# Patient Record
Sex: Female | Born: 1940
Health system: Southern US, Community
[De-identification: ages and names within clinical notes are randomized; demographics above are authoritative.]

## PROBLEM LIST (undated history)

## (undated) DIAGNOSIS — S31000A Unspecified open wound of lower back and pelvis without penetration into retroperitoneum, initial encounter: Secondary | ICD-10-CM

## (undated) DIAGNOSIS — I4819 Other persistent atrial fibrillation: Secondary | ICD-10-CM

## (undated) DIAGNOSIS — I10 Essential (primary) hypertension: Secondary | ICD-10-CM

## (undated) DIAGNOSIS — Z9289 Personal history of other medical treatment: Secondary | ICD-10-CM

## (undated) DIAGNOSIS — G8929 Other chronic pain: Secondary | ICD-10-CM

## (undated) DIAGNOSIS — M8580 Other specified disorders of bone density and structure, unspecified site: Secondary | ICD-10-CM

## (undated) DIAGNOSIS — E039 Hypothyroidism, unspecified: Secondary | ICD-10-CM

## (undated) DIAGNOSIS — Z5189 Encounter for other specified aftercare: Secondary | ICD-10-CM

## (undated) DIAGNOSIS — E785 Hyperlipidemia, unspecified: Secondary | ICD-10-CM

## (undated) DIAGNOSIS — M545 Other chronic pain: Secondary | ICD-10-CM

## (undated) DIAGNOSIS — Z8673 Personal history of transient ischemic attack (TIA), and cerebral infarction without residual deficits: Secondary | ICD-10-CM

## (undated) HISTORY — DX: Encounter for other specified aftercare: Z51.89

## (undated) HISTORY — PX: THYROIDECTOMY: SHX17

## (undated) HISTORY — PX: HAND SURGERY: SHX662

## (undated) HISTORY — DX: Other specified disorders of bone density and structure, unspecified site: M85.80

## (undated) HISTORY — DX: Hypothyroidism, unspecified: E03.9

## (undated) HISTORY — DX: Essential (primary) hypertension: I10

## (undated) HISTORY — DX: Personal history of other medical treatment: Z92.89

## (undated) HISTORY — DX: Other persistent atrial fibrillation: I48.19

## (undated) HISTORY — DX: Hyperlipidemia, unspecified: E78.5

## (undated) HISTORY — DX: Other chronic pain: M54.50

## (undated) HISTORY — DX: Other chronic pain: G89.29

## (undated) HISTORY — DX: Low back pain: M54.5

## (undated) HISTORY — DX: Personal history of transient ischemic attack (TIA), and cerebral infarction without residual deficits: Z86.73

---

## 1968-07-20 HISTORY — PX: ABDOMINAL HYSTERECTOMY: SHX81

## 1997-10-12 ENCOUNTER — Other Ambulatory Visit: Admission: RE | Admit: 1997-10-12 | Discharge: 1997-10-12 | Payer: Self-pay | Admitting: Family Medicine

## 1998-03-09 ENCOUNTER — Emergency Department (HOSPITAL_COMMUNITY): Admission: EM | Admit: 1998-03-09 | Discharge: 1998-03-09 | Payer: Self-pay | Admitting: Emergency Medicine

## 1998-09-01 ENCOUNTER — Emergency Department (HOSPITAL_COMMUNITY): Admission: EM | Admit: 1998-09-01 | Discharge: 1998-09-01 | Payer: Self-pay | Admitting: Emergency Medicine

## 1999-02-05 ENCOUNTER — Other Ambulatory Visit: Admission: RE | Admit: 1999-02-05 | Discharge: 1999-02-05 | Payer: Self-pay | Admitting: Family Medicine

## 1999-07-09 ENCOUNTER — Ambulatory Visit (HOSPITAL_BASED_OUTPATIENT_CLINIC_OR_DEPARTMENT_OTHER): Admission: RE | Admit: 1999-07-09 | Discharge: 1999-07-10 | Payer: Self-pay | Admitting: Orthopedic Surgery

## 1999-11-24 ENCOUNTER — Encounter: Payer: Self-pay | Admitting: Family Medicine

## 1999-11-24 ENCOUNTER — Encounter: Admission: RE | Admit: 1999-11-24 | Discharge: 1999-11-24 | Payer: Self-pay | Admitting: Family Medicine

## 2000-03-29 ENCOUNTER — Other Ambulatory Visit: Admission: RE | Admit: 2000-03-29 | Discharge: 2000-03-29 | Payer: Self-pay | Admitting: *Deleted

## 2000-08-23 ENCOUNTER — Other Ambulatory Visit: Admission: RE | Admit: 2000-08-23 | Discharge: 2000-08-23 | Payer: Self-pay | Admitting: Orthopedic Surgery

## 2001-01-06 ENCOUNTER — Encounter: Admission: RE | Admit: 2001-01-06 | Discharge: 2001-01-06 | Payer: Self-pay | Admitting: *Deleted

## 2001-01-12 ENCOUNTER — Encounter: Admission: RE | Admit: 2001-01-12 | Discharge: 2001-01-12 | Payer: Self-pay | Admitting: *Deleted

## 2001-01-16 ENCOUNTER — Emergency Department (HOSPITAL_COMMUNITY): Admission: EM | Admit: 2001-01-16 | Discharge: 2001-01-16 | Payer: Self-pay | Admitting: *Deleted

## 2001-01-16 ENCOUNTER — Encounter: Payer: Self-pay | Admitting: *Deleted

## 2001-02-07 ENCOUNTER — Encounter: Payer: Self-pay | Admitting: Cardiology

## 2001-02-07 ENCOUNTER — Inpatient Hospital Stay (HOSPITAL_COMMUNITY): Admission: EM | Admit: 2001-02-07 | Discharge: 2001-02-10 | Payer: Self-pay | Admitting: *Deleted

## 2001-06-14 ENCOUNTER — Encounter: Payer: Self-pay | Admitting: Emergency Medicine

## 2001-06-14 ENCOUNTER — Emergency Department (HOSPITAL_COMMUNITY): Admission: EM | Admit: 2001-06-14 | Discharge: 2001-06-14 | Payer: Self-pay | Admitting: Emergency Medicine

## 2001-06-17 ENCOUNTER — Emergency Department (HOSPITAL_COMMUNITY): Admission: EM | Admit: 2001-06-17 | Discharge: 2001-06-17 | Payer: Self-pay | Admitting: Emergency Medicine

## 2001-06-21 ENCOUNTER — Emergency Department (HOSPITAL_COMMUNITY): Admission: EM | Admit: 2001-06-21 | Discharge: 2001-06-21 | Payer: Self-pay | Admitting: *Deleted

## 2001-07-20 HISTORY — PX: COLONOSCOPY: SHX174

## 2001-10-27 ENCOUNTER — Emergency Department (HOSPITAL_COMMUNITY): Admission: EM | Admit: 2001-10-27 | Discharge: 2001-10-27 | Payer: Self-pay | Admitting: Emergency Medicine

## 2001-10-27 ENCOUNTER — Encounter: Payer: Self-pay | Admitting: Emergency Medicine

## 2001-11-08 ENCOUNTER — Encounter: Admission: RE | Admit: 2001-11-08 | Discharge: 2001-11-08 | Payer: Self-pay | Admitting: Specialist

## 2001-11-08 ENCOUNTER — Encounter: Payer: Self-pay | Admitting: Specialist

## 2002-04-18 ENCOUNTER — Encounter: Payer: Self-pay | Admitting: Family Medicine

## 2002-04-18 ENCOUNTER — Encounter: Admission: RE | Admit: 2002-04-18 | Discharge: 2002-04-18 | Payer: Self-pay | Admitting: Family Medicine

## 2002-06-20 ENCOUNTER — Encounter: Payer: Self-pay | Admitting: Internal Medicine

## 2002-06-20 LAB — HM COLONOSCOPY

## 2002-10-04 ENCOUNTER — Encounter: Payer: Self-pay | Admitting: Emergency Medicine

## 2002-10-04 ENCOUNTER — Emergency Department (HOSPITAL_COMMUNITY): Admission: EM | Admit: 2002-10-04 | Discharge: 2002-10-04 | Payer: Self-pay | Admitting: Emergency Medicine

## 2003-04-27 ENCOUNTER — Encounter: Payer: Self-pay | Admitting: Emergency Medicine

## 2003-04-27 ENCOUNTER — Emergency Department (HOSPITAL_COMMUNITY): Admission: EM | Admit: 2003-04-27 | Discharge: 2003-04-27 | Payer: Self-pay | Admitting: Emergency Medicine

## 2003-11-17 ENCOUNTER — Emergency Department (HOSPITAL_COMMUNITY): Admission: EM | Admit: 2003-11-17 | Discharge: 2003-11-17 | Payer: Self-pay | Admitting: Family Medicine

## 2004-08-20 ENCOUNTER — Ambulatory Visit: Payer: Self-pay | Admitting: Family Medicine

## 2004-11-21 ENCOUNTER — Ambulatory Visit (HOSPITAL_COMMUNITY): Admission: RE | Admit: 2004-11-21 | Discharge: 2004-11-21 | Payer: Self-pay | Admitting: Anesthesiology

## 2004-12-11 ENCOUNTER — Ambulatory Visit: Payer: Self-pay | Admitting: Family Medicine

## 2004-12-16 ENCOUNTER — Ambulatory Visit: Payer: Self-pay | Admitting: Family Medicine

## 2005-05-25 ENCOUNTER — Ambulatory Visit: Payer: Self-pay | Admitting: Family Medicine

## 2005-07-20 ENCOUNTER — Encounter: Payer: Self-pay | Admitting: Family Medicine

## 2005-09-15 ENCOUNTER — Ambulatory Visit: Payer: Self-pay | Admitting: Family Medicine

## 2006-01-12 ENCOUNTER — Ambulatory Visit (HOSPITAL_COMMUNITY): Admission: RE | Admit: 2006-01-12 | Discharge: 2006-01-12 | Payer: Self-pay | Admitting: Anesthesiology

## 2006-02-16 ENCOUNTER — Ambulatory Visit (HOSPITAL_COMMUNITY): Admission: RE | Admit: 2006-02-16 | Discharge: 2006-02-16 | Payer: Self-pay | Admitting: Anesthesiology

## 2006-06-17 ENCOUNTER — Emergency Department (HOSPITAL_COMMUNITY): Admission: EM | Admit: 2006-06-17 | Discharge: 2006-06-17 | Payer: Self-pay | Admitting: Family Medicine

## 2006-07-09 ENCOUNTER — Ambulatory Visit: Payer: Self-pay | Admitting: Family Medicine

## 2006-07-14 ENCOUNTER — Ambulatory Visit: Payer: Self-pay | Admitting: Family Medicine

## 2006-11-24 ENCOUNTER — Ambulatory Visit: Payer: Self-pay | Admitting: Family Medicine

## 2006-11-24 LAB — CONVERTED CEMR LAB
ALT: 15 units/L (ref 0–40)
Albumin: 3.1 g/dL — ABNORMAL LOW (ref 3.5–5.2)
Alkaline Phosphatase: 47 units/L (ref 39–117)
Basophils Relative: 0.2 % (ref 0.0–1.0)
Bilirubin, Direct: 0.1 mg/dL (ref 0.0–0.3)
Cholesterol: 247 mg/dL (ref 0–200)
Creatinine, Ser: 1.4 mg/dL — ABNORMAL HIGH (ref 0.4–1.2)
Direct LDL: 136.6 mg/dL
Eosinophils Absolute: 0.1 10*3/uL (ref 0.0–0.6)
GFR calc Af Amer: 48 mL/min
Glucose, Bld: 83 mg/dL (ref 70–99)
HCT: 33.3 % — ABNORMAL LOW (ref 36.0–46.0)
HDL: 49.1 mg/dL (ref >39.0)
Hemoglobin: 11.5 g/dL — ABNORMAL LOW (ref 12.0–15.0)
MCV: 92.6 fL (ref 78.0–100.0)
Monocytes Absolute: 0.5 10*3/uL (ref 0.2–0.7)
Monocytes Relative: 6.1 % (ref 3.0–11.0)
Neutro Abs: 6.2 10*3/uL (ref 1.4–7.7)
Neutrophils Relative %: 72.8 % (ref 43.0–77.0)
Platelets: 262 10*3/uL (ref 150–400)
RBC: 3.6 M/uL — ABNORMAL LOW (ref 3.87–5.11)
Sodium: 138 meq/L (ref 135–145)
TSH: 4.01 microintl units/mL (ref 0.35–5.50)
Total CHOL/HDL Ratio: 5

## 2007-03-08 ENCOUNTER — Telehealth: Payer: Self-pay | Admitting: Family Medicine

## 2007-03-08 ENCOUNTER — Ambulatory Visit: Payer: Self-pay | Admitting: Family Medicine

## 2007-03-08 LAB — CONVERTED CEMR LAB
Cholesterol: 200 mg/dL (ref 0–200)
HDL: 44.7 mg/dL (ref >39.0)
Total CHOL/HDL Ratio: 4.5

## 2007-04-13 ENCOUNTER — Ambulatory Visit (HOSPITAL_COMMUNITY): Admission: RE | Admit: 2007-04-13 | Discharge: 2007-04-13 | Payer: Self-pay | Admitting: Anesthesiology

## 2007-08-30 ENCOUNTER — Telehealth: Payer: Self-pay | Admitting: Family Medicine

## 2007-10-10 ENCOUNTER — Ambulatory Visit (HOSPITAL_COMMUNITY): Admission: RE | Admit: 2007-10-10 | Discharge: 2007-10-10 | Payer: Self-pay | Admitting: Anesthesiology

## 2007-10-13 ENCOUNTER — Ambulatory Visit (HOSPITAL_COMMUNITY): Admission: RE | Admit: 2007-10-13 | Discharge: 2007-10-13 | Payer: Self-pay | Admitting: Anesthesiology

## 2007-10-20 ENCOUNTER — Telehealth: Payer: Self-pay | Admitting: Family Medicine

## 2007-10-24 ENCOUNTER — Telehealth: Payer: Self-pay | Admitting: Family Medicine

## 2007-11-09 ENCOUNTER — Ambulatory Visit: Payer: Self-pay | Admitting: Family Medicine

## 2007-11-09 DIAGNOSIS — I10 Essential (primary) hypertension: Secondary | ICD-10-CM

## 2007-11-09 DIAGNOSIS — E785 Hyperlipidemia, unspecified: Secondary | ICD-10-CM | POA: Insufficient documentation

## 2007-11-09 DIAGNOSIS — D649 Anemia, unspecified: Secondary | ICD-10-CM | POA: Insufficient documentation

## 2007-11-09 DIAGNOSIS — M8580 Other specified disorders of bone density and structure, unspecified site: Secondary | ICD-10-CM

## 2007-11-09 DIAGNOSIS — T50995A Adverse effect of other drugs, medicaments and biological substances, initial encounter: Secondary | ICD-10-CM

## 2007-11-17 ENCOUNTER — Ambulatory Visit: Payer: Self-pay | Admitting: Family Medicine

## 2007-11-17 LAB — CONVERTED CEMR LAB
ALT: 11 units/L (ref 0–35)
Alkaline Phosphatase: 50 units/L (ref 39–117)
Basophils Relative: 0 % (ref 0.0–1.0)
Bilirubin, Direct: 0.1 mg/dL (ref 0.0–0.3)
CO2: 33 meq/L — ABNORMAL HIGH (ref 19–32)
Calcium: 9.6 mg/dL (ref 8.4–10.5)
Chloride: 95 meq/L — ABNORMAL LOW (ref 96–112)
Eosinophils Absolute: 0 10*3/uL (ref 0.0–0.7)
Eosinophils Relative: 0.7 % (ref 0.0–5.0)
GFR calc Af Amer: 80 mL/min
HCT: 42 % (ref 36.0–46.0)
Lymphocytes Relative: 14.3 % (ref 12.0–46.0)
Monocytes Relative: 4.5 % (ref 3.0–12.0)
Neutro Abs: 5.7 10*3/uL (ref 1.4–7.7)
Neutrophils Relative %: 80.5 % — ABNORMAL HIGH (ref 43.0–77.0)
Total Bilirubin: 0.6 mg/dL (ref 0.3–1.2)
Total CHOL/HDL Ratio: 4.7
VLDL: 33 mg/dL (ref 0–40)
Vit D, 1,25-Dihydroxy: 14 — ABNORMAL LOW (ref 30–89)
WBC: 7 10*3/uL (ref 4.5–10.5)

## 2007-11-21 DIAGNOSIS — E039 Hypothyroidism, unspecified: Secondary | ICD-10-CM

## 2007-12-19 ENCOUNTER — Ambulatory Visit (HOSPITAL_COMMUNITY): Admission: RE | Admit: 2007-12-19 | Discharge: 2007-12-19 | Payer: Self-pay | Admitting: Family Medicine

## 2008-07-20 HISTORY — PX: FLEXIBLE SIGMOIDOSCOPY: SHX1649

## 2008-11-17 HISTORY — PX: LUMBAR LAMINECTOMY: SHX95

## 2008-12-13 ENCOUNTER — Ambulatory Visit: Payer: Self-pay | Admitting: Family Medicine

## 2008-12-13 DIAGNOSIS — M1710 Unilateral primary osteoarthritis, unspecified knee: Secondary | ICD-10-CM | POA: Insufficient documentation

## 2008-12-13 DIAGNOSIS — M5126 Other intervertebral disc displacement, lumbar region: Secondary | ICD-10-CM | POA: Insufficient documentation

## 2008-12-13 DIAGNOSIS — IMO0002 Reserved for concepts with insufficient information to code with codable children: Secondary | ICD-10-CM

## 2008-12-13 LAB — CONVERTED CEMR LAB
Glucose, Urine, Semiquant: NEGATIVE
Urobilinogen, UA: 1
pH: 5.5

## 2008-12-26 ENCOUNTER — Ambulatory Visit: Payer: Self-pay | Admitting: Cardiology

## 2008-12-26 ENCOUNTER — Ambulatory Visit: Payer: Self-pay | Admitting: Internal Medicine

## 2008-12-26 ENCOUNTER — Inpatient Hospital Stay (HOSPITAL_COMMUNITY): Admission: EM | Admit: 2008-12-26 | Discharge: 2009-01-07 | Payer: Self-pay | Admitting: Emergency Medicine

## 2008-12-26 LAB — CONVERTED CEMR LAB
AST: 14 units/L (ref 0–37)
BUN: 15 mg/dL (ref 6–23)
Basophils Relative: 0 %
Basophils Relative: 0.4 % (ref 0.0–3.0)
Bilirubin, Direct: 0.1 mg/dL (ref 0.0–0.3)
CO2: 30 meq/L
Chloride: 81 meq/L
Chloride: 98 meq/L (ref 96–112)
Cholesterol: 196 mg/dL (ref 0–200)
Creatinine, Ser: 3.98 mg/dL
Eosinophils Absolute: 0.1 10*3/uL (ref 0.0–0.7)
GFR calc non Af Amer: 65.93 mL/min (ref >60)
Glucose, Bld: 120 mg/dL
HCT: 30.5 %
Hemoglobin: 10.4 g/dL
LDL Cholesterol: 123 mg/dL — ABNORMAL HIGH (ref 0–99)
Lymphocytes Relative: 15.9 % (ref 12.0–46.0)
MCHC: 36 g/dL (ref 30.0–36.0)
MCV: 89.8 fL (ref 78.0–100.0)
MCV: 91.6 fL
Monocytes Relative: 2 %
Neutrophils Relative %: 95 %
Platelets: 304 10*3/uL (ref 150.0–400.0)
Potassium: 3.4 meq/L — ABNORMAL LOW (ref 3.5–5.1)
Potassium: 3.6 meq/L
RBC: 4.05 M/uL (ref 3.87–5.11)
Sodium: 126 meq/L
Sodium: 134 meq/L — ABNORMAL LOW (ref 135–145)
TSH: 2.37 microintl units/mL (ref 0.35–5.50)
Total Bilirubin: 0.8 mg/dL (ref 0.3–1.2)
Triglycerides: 128 mg/dL (ref 0.0–149.0)
VLDL: 25.6 mg/dL (ref 0.0–40.0)
Vit D, 25-Hydroxy: 29 ng/mL — ABNORMAL LOW (ref 30–89)
WBC: 20.6 10*3/uL
WBC: 7.2 10*3/uL (ref 4.5–10.5)

## 2008-12-27 ENCOUNTER — Encounter: Payer: Self-pay | Admitting: Internal Medicine

## 2008-12-27 ENCOUNTER — Encounter (INDEPENDENT_AMBULATORY_CARE_PROVIDER_SITE_OTHER): Payer: Self-pay | Admitting: Internal Medicine

## 2008-12-27 ENCOUNTER — Ambulatory Visit: Payer: Self-pay | Admitting: Vascular Surgery

## 2008-12-27 ENCOUNTER — Encounter: Payer: Self-pay | Admitting: Family Medicine

## 2008-12-27 LAB — CONVERTED CEMR LAB
ALT: 13 units/L
Albumin: 1.7 g/dL
Chloride: 86 meq/L
Hemoglobin: 9.3 g/dL
Total Protein: 5.2 g/dL
WBC: 20 10*3/uL

## 2008-12-31 ENCOUNTER — Encounter: Payer: Self-pay | Admitting: Family Medicine

## 2009-01-03 ENCOUNTER — Encounter: Payer: Self-pay | Admitting: Internal Medicine

## 2009-01-04 ENCOUNTER — Ambulatory Visit: Payer: Self-pay | Admitting: Gastroenterology

## 2009-01-05 ENCOUNTER — Encounter (INDEPENDENT_AMBULATORY_CARE_PROVIDER_SITE_OTHER): Payer: Self-pay | Admitting: Gastroenterology

## 2009-01-05 ENCOUNTER — Encounter: Payer: Self-pay | Admitting: Internal Medicine

## 2009-01-07 ENCOUNTER — Encounter: Payer: Self-pay | Admitting: Internal Medicine

## 2009-01-07 LAB — CONVERTED CEMR LAB
BUN: 4 mg/dL
CO2: 23 meq/L
Calcium: 7.4 mg/dL
Creatinine, Ser: 0.75 mg/dL
Glucose, Bld: 91 mg/dL
Potassium: 4.3 meq/L
RBC: 3.2 M/uL
RDW: 14.9 %
Sodium: 138 meq/L
WBC: 14.2 10*3/uL

## 2009-01-10 ENCOUNTER — Telehealth (INDEPENDENT_AMBULATORY_CARE_PROVIDER_SITE_OTHER): Payer: Self-pay | Admitting: *Deleted

## 2009-01-23 ENCOUNTER — Encounter: Payer: Self-pay | Admitting: Internal Medicine

## 2009-01-23 DIAGNOSIS — G894 Chronic pain syndrome: Secondary | ICD-10-CM | POA: Insufficient documentation

## 2009-01-23 DIAGNOSIS — M1990 Unspecified osteoarthritis, unspecified site: Secondary | ICD-10-CM | POA: Insufficient documentation

## 2009-01-24 ENCOUNTER — Ambulatory Visit: Payer: Self-pay | Admitting: Internal Medicine

## 2009-01-24 ENCOUNTER — Telehealth: Payer: Self-pay | Admitting: Internal Medicine

## 2009-01-24 DIAGNOSIS — I2510 Atherosclerotic heart disease of native coronary artery without angina pectoris: Secondary | ICD-10-CM | POA: Insufficient documentation

## 2009-01-31 ENCOUNTER — Ambulatory Visit: Payer: Self-pay | Admitting: Internal Medicine

## 2009-01-31 DIAGNOSIS — R197 Diarrhea, unspecified: Secondary | ICD-10-CM

## 2009-04-29 ENCOUNTER — Telehealth: Payer: Self-pay | Admitting: Internal Medicine

## 2009-05-22 ENCOUNTER — Encounter: Admission: RE | Admit: 2009-05-22 | Discharge: 2009-05-22 | Payer: Self-pay | Admitting: Internal Medicine

## 2009-07-20 HISTORY — PX: CHOLECYSTECTOMY: SHX55

## 2009-07-25 ENCOUNTER — Inpatient Hospital Stay (HOSPITAL_COMMUNITY): Admission: AD | Admit: 2009-07-25 | Discharge: 2009-07-27 | Payer: Self-pay | Admitting: General Surgery

## 2009-08-15 ENCOUNTER — Encounter: Payer: Self-pay | Admitting: Internal Medicine

## 2009-11-21 ENCOUNTER — Telehealth: Payer: Self-pay | Admitting: Internal Medicine

## 2009-12-17 ENCOUNTER — Telehealth: Payer: Self-pay | Admitting: Family Medicine

## 2009-12-17 ENCOUNTER — Telehealth: Payer: Self-pay | Admitting: Internal Medicine

## 2010-01-09 ENCOUNTER — Ambulatory Visit: Payer: Self-pay | Admitting: Internal Medicine

## 2010-01-09 DIAGNOSIS — R1084 Generalized abdominal pain: Secondary | ICD-10-CM | POA: Insufficient documentation

## 2010-01-09 LAB — CONVERTED CEMR LAB
Albumin: 3.9 g/dL (ref 3.5–5.2)
BUN: 20 mg/dL (ref 6–23)
Basophils Relative: 0.3 % (ref 0.0–3.0)
CO2: 29 meq/L (ref 19–32)
Calcium: 9.1 mg/dL (ref 8.4–10.5)
Chloride: 99 meq/L (ref 96–112)
Eosinophils Absolute: 0.1 10*3/uL (ref 0.0–0.7)
Eosinophils Relative: 0.8 % (ref 0.0–5.0)
GFR calc non Af Amer: 53.83 mL/min (ref >60)
Glucose, Bld: 104 mg/dL — ABNORMAL HIGH (ref 70–99)
Lymphs Abs: 1.7 10*3/uL (ref 0.7–4.0)
Monocytes Relative: 5.2 % (ref 3.0–12.0)
RDW: 13.5 % (ref 11.5–14.6)
Sodium: 136 meq/L (ref 135–145)
TSH: 3.17 microintl units/mL (ref 0.35–5.50)
Total Protein: 7.1 g/dL (ref 6.0–8.3)
WBC: 6.3 10*3/uL (ref 4.5–10.5)

## 2010-01-10 ENCOUNTER — Ambulatory Visit: Payer: Self-pay | Admitting: Cardiology

## 2010-04-15 ENCOUNTER — Telehealth: Payer: Self-pay | Admitting: Internal Medicine

## 2010-04-16 ENCOUNTER — Encounter: Payer: Self-pay | Admitting: Internal Medicine

## 2010-04-18 ENCOUNTER — Ambulatory Visit: Payer: Self-pay | Admitting: Internal Medicine

## 2010-04-18 DIAGNOSIS — M546 Pain in thoracic spine: Secondary | ICD-10-CM | POA: Insufficient documentation

## 2010-05-26 ENCOUNTER — Encounter: Admission: RE | Admit: 2010-05-26 | Discharge: 2010-05-26 | Payer: Self-pay | Admitting: Internal Medicine

## 2010-05-26 LAB — HM MAMMOGRAPHY: HM Mammogram: NEGATIVE

## 2010-06-03 ENCOUNTER — Encounter: Admission: RE | Admit: 2010-06-03 | Discharge: 2010-06-03 | Payer: Self-pay | Admitting: Orthopaedic Surgery

## 2010-06-24 ENCOUNTER — Ambulatory Visit: Payer: Self-pay | Admitting: Internal Medicine

## 2010-06-24 DIAGNOSIS — J209 Acute bronchitis, unspecified: Secondary | ICD-10-CM

## 2010-08-10 ENCOUNTER — Encounter: Payer: Self-pay | Admitting: Anesthesiology

## 2010-08-19 NOTE — Letter (Signed)
Summary: New Patient letter  [facility]  [address] [place], [place] [address]   Phone: [phone]  Fax: [phone]       04/16/2010 MRN: [id number]  [place], [place]  [address]  Dear Ms. Gabrielle,  Welcome to the Gastroenterology Division at [facility].    You are scheduled to see Dr.  Carlean Purl on 04-18-10 at  2 pm on the 3rd floor at [facility], [place] [employer].  We ask that you try to arrive at our office 15 minutes prior to your appointment time to allow for check-in.  We would like you to complete the enclosed self-administered evaluation form prior to your visit and bring it with you on the day of your appointment.  We will review it with you.  Also, please bring a complete list of all your medications or, if you prefer, bring the medication bottles and we will list them.  Please bring your insurance card so that we may make a copy of it.  If your insurance requires a referral to see a specialist, please bring your referral form from your primary care physician.  Co-payments are due at the time of your visit and may be paid by cash, check or credit card.     Your office visit will consist of a consult with your physician (includes a physical exam), any laboratory testing he/she may order, scheduling of any necessary diagnostic testing (e.g. x-ray, ultrasound, CT-scan), and scheduling of a procedure (e.g. Endoscopy, Colonoscopy) if required.  Please allow enough time on your schedule to allow for any/all of these possibilities.    If you cannot keep your appointment, please call [phone] to cancel or reschedule prior to your appointment date.  This allows [place] the opportunity to schedule an appointment for another patient in need of care.  If you do not cancel or reschedule by 5 p.m. the business day prior to your appointment date, you will be charged a $50.00 late cancellation/no-show fee.    Thank you for choosing  [facility] for your medical needs.  We appreciate the opportunity to care for you.  Please visit [place] at our website  to learn more about our practice.                     Sincerely,                                                             The Gastroenterology Division

## 2010-08-19 NOTE — Medication Information (Signed)
Summary: Synthroid/[employer]  Synthroid/[employer]   Imported By: Phillis Knack 08/19/2009 07:17:55  _____________________________________________________________________  External Attachment:    Type:   Image     Comment:   External Document

## 2010-08-19 NOTE — Assessment & Plan Note (Signed)
Summary: COUGH  -  CONGEST-  WHEEZING   --STC   Vital Signs:  Patient profile:   70 year old female Height:      62 inches (157.48 cm) Weight:      172 pounds (78.18 kg) O2 Sat:      93 % on Room air Temp:     98.8 degrees F (37.11 degrees C) oral Pulse rate:   75 / minute BP sitting:   120 / 70  (left arm) Cuff size:   regular  Vitals Entered By: Tomma Lightning RMA (June 24, 2010 11:12 AM)  O2 Flow:  Room air CC: Cough & wheezing Is Patient Diabetic? No Pain Assessment Patient in pain? no        Primary Care Provider:  Rowe Clack MD  CC:  Cough & wheezing.  History of Present Illness: c/o cough and wheeze onset 5 days ago - started with loss of voice and ST -  progressed to productive cough (yellow green) no fever but +chilled and fatigue no CP or SOB but wheezing, esp when lying down no HA or nasal congestion/sneezing not improved with otc lozenges  reviewed chronic med issues as well- hypothyroid  - reports compliance with ongoing medical treatment and no changes in medication dose or frequency. denies adverse side effects related to current therapy.   HTN - reports compliance with ongoing medical treatment and no changes in medication dose or frequency. denies adverse side effects related to current therapy.   Current Medications (verified): 1)  Diovan Hct 160-25 Mg Tabs (Valsartan-Hydrochlorothiazide) .Marland Kitchen.. 1 Qd 2)  Estradiol 2 Mg Tabs (Estradiol) .Marland Kitchen.. 1 Qd 3)  Synthroid 25 Mcg Tabs (Levothyroxine Sodium) .... Once Daily- 4)  Tandem Plus 162-115.2-1 Mg Caps (Fefum-Fepo-Fa-B Cmp-C-Zn-Mn-Cu) .... Take 1 Capsule By Mouth Once A Day 5)  Metoprolol Tartrate 25 Mg  Tabs (Metoprolol Tartrate) .... Two Times A Day For Blood Pressure 6)  Fluoxetine Hcl 20 Mg  Tabs (Fluoxetine Hcl) .Marland Kitchen.. 1 By Mouth Once Daily 7)  Metanx 2.8-25-2 Mg  Tabs (L-Methylfolate-B6-B12) .... 2 Once Daily 8)  Alprazolam 0.25 Mg  Tbdp (Alprazolam) .... Dr [address] 9)  Ms Contin 30 Mg   Tb12 (Morphine Sulfate) .... [address] 10)  Claritin 10 Mg Tabs (Loratadine) .Marland Kitchen.. 1 By Mouth Once Daily As Needed For Itch 11)  Align  Caps (Probiotic Product) .... Take 1 By Mouth Once Daily 12)  Neurontin 100 Mg Caps (Gabapentin) .... One Tablet By Mouth Three Times A Day  Allergies (verified): 1)  ! Pcn  Past History:  Past Medical History: Hypertension Hypothyroidism back pain Hyperlipidemia  Osteopenia Coronary artery disease Reflex sympathetic dystrophy of the (R) hand Urosepsis after lumbar surgery  MD roster: pain -[facility] - [facility]  Review of Systems  The patient denies weight loss, vision loss, decreased hearing, hoarseness, peripheral edema, and hemoptysis.    Physical Exam  General:  alert, well-developed, well-nourished, and cooperative to examination. mod ill Lungs:  B rhonchi and exp wheeze, no crackles Heart:  normal rate, regular rhythm, no murmur, and no rub. BLE without edema Neurologic:  alert & oriented X3 and cranial nerves II-XII symetrically intact.  strength grossly normal in all extremities and gait normal. speech fluent without dysarthria or aphasia; follows commands with good comprehension.    Impression & Recommendations:  Problem # 1:  ACUTE BRONCHITIS (ICD-466.0) steroid shot today for wheeze - dual abx for typical and atypical infx (FQ too costly) -  reports can tol ceftn ok despite remote pcn allg  Her updated medication list for this problem includes:    Cefuroxime Axetil 500 Mg Tabs (Cefuroxime axetil) .Marland Kitchen... 1 by mouth two times a day x 7 days    Azithromycin 250 Mg Tabs (Azithromycin) .Marland Kitchen... 2 tabs by mouth today, then 1 by mouth daily starting tomorrow    Tessalon Perles 100 Mg Caps (Benzonatate) .Marland Kitchen... 1 by mouth three times a day x 5 days, then as needed for cough  Orders: Depo- Medrol 80mg  (J1040) Depo- Medrol 40mg  (J1030) Admin of Therapeutic Inj  intramuscular or subcutaneous [id number]) Prescription  Created Electronically [id number])  Take antibiotics and other medications as directed. Encouraged to push clear liquids, get enough rest, and take acetaminophen as needed. To be seen in 5-7 days if no improvement, sooner if worse.  Complete Medication List: 1)  Diovan Hct 160-25 Mg Tabs (Valsartan-hydrochlorothiazide) .Marland Kitchen.. 1 qd 2)  Estradiol 2 Mg Tabs (Estradiol) .Marland Kitchen.. 1 qd 3)  Synthroid 25 Mcg Tabs (Levothyroxine sodium) .... Once daily- 4)  Tandem Plus 162-115.2-1 Mg Caps (Fefum-fepo-fa-b cmp-c-zn-mn-cu) .... Take 1 capsule by mouth once a day 5)  Metoprolol Tartrate 25 Mg Tabs (Metoprolol tartrate) .... Two times a day for blood pressure 6)  Fluoxetine Hcl 20 Mg Tabs (Fluoxetine hcl) .Marland Kitchen.. 1 by mouth once daily 7)  Metanx 2.8-25-2 Mg Tabs (L-methylfolate-b6-b12) .... 2 once daily 8)  Alprazolam 0.25 Mg Tbdp (Alprazolam) .... Dr Hardin Negus fills 9)  Ms Contin 30 Mg Tb12 (Morphine sulfate) .... Dr Hardin Negus fills 10)  Claritin 10 Mg Tabs (Loratadine) .Marland Kitchen.. 1 by mouth once daily as needed for itch 11)  Align Caps (Probiotic product) .... Take 1 by mouth once daily 12)  Neurontin 100 Mg Caps (Gabapentin) .... One tablet by mouth three times a day 13)  Cefuroxime Axetil 500 Mg Tabs (Cefuroxime axetil) .Marland Kitchen.. 1 by mouth two times a day x 7 days 14)  Azithromycin 250 Mg Tabs (Azithromycin) .... 2 tabs by mouth today, then 1 by mouth daily starting tomorrow 15)  Tessalon Perles 100 Mg Caps (Benzonatate) .Marland Kitchen.. 1 by mouth three times a day x 5 days, then as needed for cough  Patient Instructions: 1)  it was good to see you today. 2)  2 antibiotics - ceftin and Zpak - + cough pills for your bronchitis symptoms - your prescriptions have been electronically submitted to your pharmacy. Please take as directed. Contact our office if you believe you're having problems with the medication(s).  3)  also steroid shot for wheeze given in office today 4)  Get plenty of rest, drink lots of clear liquids, and use Tylenol  or Ibuprofen for fever and comfort. Return in 7-10 days if you're not better:sooner if you're feeling worse. Prescriptions: TESSALON PERLES 100 MG CAPS (BENZONATATE) 1 by mouth three times a day x 5 days, then as needed for cough  #30 x 0   Entered and Authorized by:   Rowe Clack MD   Signed by:   Rowe Clack MD on 06/24/2010   Method used:   [facility]        [facility] [address] [phone]* (retail)       [address]       [place], [place]  [address]       Ph: [phone] or [phone]       Fax: [phone]   RxID:   [id number] AZITHROMYCIN 250 MG TABS (AZITHROMYCIN)  2 tabs by mouth today, then 1 by mouth daily starting tomorrow  #6 x 0   Entered and Authorized by:   Rowe Clack MD   Signed by:   Rowe Clack MD on 06/24/2010   Method used:   [facility]        [facility] [address] [phone]* (retail)       [address]       [place], [place]  [address]       Ph: [phone] or [phone]       Fax: [phone]   RxID:   [id number] CEFUROXIME AXETIL 500 MG TABS (CEFUROXIME AXETIL) 1 by mouth two times a day x 7 days  #14 x 0   Entered and Authorized by:   Rowe Clack MD   Signed by:   Rowe Clack MD on 06/24/2010   Method used:   [facility]        [facility] [address] [phone]* (retail)       [address]       [place], [place]  [address]       Ph: [phone] or [phone]       Fax: [phone]   RxID:   [id number]    Medication Administration  Injection # 1:    Medication: Depo- Medrol 80mg     Diagnosis: ACUTE BRONCHITIS (ICD-466.0)    Route: IM    Site: RUOQ gluteus    Exp Date: 10/2010    Lot #: [id number]    Mfr: [employer]    Comments: Gave totalmof 120mg     Patient tolerated injection without complications    Given by: Tomma Lightning RMA (June 24, 2010 11:57 AM)  Injection # 2:    Medication: Depo- Medrol 40mg      Diagnosis: ACUTE BRONCHITIS (ICD-466.0)  Orders Added: 1)  Depo- Medrol 80mg  [J1040] 2)  Depo- Medrol 40mg  [J1030] 3)  Admin of Therapeutic Inj  intramuscular or subcutaneous [[id number]] 4)  Est. Patient Level IV [id number] 5)  Prescription Created Electronically [phone]

## 2010-08-19 NOTE — Progress Notes (Signed)
Summary: diovan & estradiol  Phone Note Refill Request Message from:  Fax from Pharmacy on Dec 17, 2009 3:25 PM  Refills Requested: Medication #1:  DIOVAN HCT 160-25 MG TABS 1 qd   Last Refilled: 11/15/2009  Medication #2:  ESTRADIOL 2 MG TABS 1 qd   Last Refilled: 11/15/2009  Method Requested: Electronic Initial call taken by: Tomma Lightning,  Dec 17, 2009 3:25 PM    Prescriptions: ESTRADIOL 2 MG TABS (ESTRADIOL) 1 qd  #30 x 1   Entered by:   Tomma Lightning   Authorized by:   Rowe Clack MD   Signed by:   Tomma Lightning on 12/17/2009   Method used:   [facility] to        [facility] [address] [phone]* (retail)       [address]       [place], [place]  [address]       Ph: [phone] or [phone]       Fax: [phone]   RxID:   [id number] DIOVAN HCT 160-25 MG TABS (VALSARTAN-HYDROCHLOROTHIAZIDE) 1 qd  #30 x 1   Entered by:   Tomma Lightning   Authorized by:   Rowe Clack MD   Signed by:   Tomma Lightning on 12/17/2009   Method used:   Electronically to        [facility] [address] [phone]* (retail)       [place] [address]       [place], [place]  [address]       Ph: [phone] or [phone]       Fax: [phone]   RxID:   [id number]   Appended Document: diovan & estradiol 7 loratadine    Clinical Lists Changes  Medications: Rx of CLARITIN 10 MG TABS (LORATADINE) 1 by mouth once daily as needed for itch;  #30 x 1;  Signed;  Entered by: Tomma Lightning;  Authorized by: Rowe Clack MD;  Method used: [facility] [address] [phone]*, [address], [place], [place], [place]  [address], Ph: [phone] or [phone], Fax: [phone]    Prescriptions: CLARITIN 10 MG TABS (LORATADINE) 1 by mouth once daily as needed for itch  #30 x 1   Entered by:   Tomma Lightning   Authorized by:   Rowe Clack MD   Signed by:   Tomma Lightning on 12/17/2009   Method used:   Electronically to        [facility] [address] [phone]* (retail)       [address]       [place], [place]  [address]       Ph: [phone] or [phone]       Fax: [phone]   RxID:   [id number]

## 2010-08-19 NOTE — Procedures (Signed)
Summary: Colonoscopy: Dr. Inocente Salles: Diverticulosis, Hemorrhoids   Colonoscopy  Procedure date:  06/20/2002  Findings:      Diverticulosis.  Hemorrhoids, External.   Performed by Dr. Rachelle Hora  Comments:      Repeat colonoscopy in 5 years.   Procedures Next Due Date:    Colonoscopy: 06/2007  Patient Name: Valerie Mcclure, Valerie Mcclure MRN:  Procedure Procedures: Colonoscopy CPT: [id number].  Personnel: Endoscopist: Clarene Reamer, MD.  Referred By: Danella Maiers, MD.  Exam Location: Exam performed in Outpatient Clinic. Outpatient  Patient Consent: Procedure, Alternatives, Risks and Benefits discussed, consent obtained, from patient. Consent was obtained by the RN.  Indications Symptoms: Constipation  History  Pre-Exam Physical: Performed Jun 20, 2002. Rectal exam abnormal. Abdominal exam, Extremity exam, Mental status exam WNL.  Exam Exam: Extent of exam reached: Cecum, extent intended: Cecum.  The cecum was identified by appendiceal orifice and IC valve. Colon retroflexion performed. Images were not taken. ASA Classification: II. Tolerance: fair.  Monitoring: Pulse and BP monitoring, Oximetry used. Supplemental O2 given.  Colon Prep Prep results: good.  Sedation Meds: Patient assessed and found to be appropriate for moderate (conscious) sedation. Fentanyl 150 mcg. given IV. Versed 13 given IV.  Findings - DIVERTICULOSIS: Splenic Flexure to Sigmoid Colon. ICD9: Diverticulosis: 562.10. Comments: mild to moderately severe .  - MELANOSIS: Cecum to Sigmoid Colon. Comments: mild.  - NOT SEEN ON EXAM: Cecum to Rectum. Polyps, AVM's, Colitis, Tumors, Crohn's,  - HEMORRHOIDS: External. Size: Grade I. ICD9: Hemorrhoids, External: 455.3. Comments: pruitis ane.   Assessment Abnormal examination, see findings above.  Diagnoses: 562.10: Diverticulosis.  455.3: Hemorrhoids, External.   Events  Unplanned Interventions: No intervention was required.  Unplanned Events: There  were no complications. Plans Medication Plan: Continue current medications.  Patient Education: Patient given standard instructions for: Diverticulosis. Constipation. Yearly hemoccult testing recommended. Patient instructed to get routine colonoscopy every 5 years.  Disposition: After procedure patient sent to recovery. After recovery patient sent home.   CC:   Danella Maiers, MD  This report was created from the original endoscopy report, which was reviewed and signed by the above listed endoscopist.

## 2010-08-19 NOTE — Progress Notes (Signed)
Summary: REFILL METOPROLOL TARTRATE NEEDS OV  Phone Note From Pharmacy   [phone] Summary of Call: refill metroprolol      New/Updated Medications: METOPROLOL TARTRATE 25 MG  TABS (METOPROLOL TARTRATE) two times a day for blood pressure NEEDS OV PRIOR TO NEXT REFILL   Prescriptions: METOPROLOL TARTRATE 25 MG  TABS (METOPROLOL TARTRATE) two times a day for blood pressure NEEDS OV PRIOR TO NEXT REFILL  #30 x 0   Entered by:   Levora Angel, RN   Authorized by:   Emeterio Reeve MD   Signed by:   Levora Angel, RN on 08/30/2007   Method used:   Electronically sent to ...       [facility]  [facility] [address] [phone]*       [place]       [place], [place]  [address]       Ph: [phone] or [phone]       Fax: [phone]   RxID:   [id number]

## 2010-08-19 NOTE — Assessment & Plan Note (Signed)
Summary: f/u per triage/#/cd   Vital Signs:  Patient profile:   70 year old female Height:      62 inches (157.48 cm) Weight:      164.8 pounds (74.91 kg) BMI:     30.25 O2 Sat:      95 % on Room air Temp:     97.2 degrees F (36.22 degrees C) oral Pulse rate:   58 / minute BP sitting:   124 / 68  (left arm) Cuff size:   regular  Vitals Entered By: Tomma Lightning (January 09, 2010 11:16 AM)  O2 Flow:  Room air CC: follow-up visit Is Patient Diabetic? No Pain Assessment Patient in pain? no      Comments Req refills on tandem plus & metanx. also want samples of Align if we have   Primary Care Provider:  Rowe Clack MD  CC:  follow-up visit.  History of Present Illness: abd pain - ongoing since chole [id number] - complicated course/op reviewed - noted firm swelling midline upper abd (hard to close waistband of pants) also tenderness down right abd - UQ at scar down to RLQ and around to flank no fever, no weight loss no change BMs, no n/v OV surg once postop but not for this issue  hypothyroid  - reports compliance with ongoing medical treatment and no changes in medication dose or frequency. denies adverse side effects related to current therapy.   HTN - reports compliance with ongoing medical treatment and no changes in medication dose or frequency. denies adverse side effects related to current therapy.   Clinical Review Panels:  Immunizations   Last Tetanus Booster:  given (07/20/2005)  Lipid Management   Cholesterol:  196 (12/13/2008)   LDL (bad choesterol):  123 (12/13/2008)   HDL (good cholesterol):  47.90 (12/13/2008)  CBC   WBC:  14.2 (01/07/2009)   RBC:  3.20 (01/07/2009)   Hgb:  10.0 (01/07/2009)   Hct:  29.2 (01/07/2009)   Platelets:  331 (01/07/2009)   MCV  91.3 (01/07/2009)   MCHC  36.0 (12/13/2008)   RDW  14.9 (01/07/2009)   PMN:  95 (12/26/2008)   Lymphs:  15.9 (12/13/2008)   Monos:  2 (12/26/2008)   Eosinophils:  0 (12/26/2008)   Basophil:   0 (12/26/2008)  Complete Metabolic Panel   Glucose:  91 (01/07/2009)   Sodium:  138 (01/07/2009)   Potassium:  4.3 (01/07/2009)   Chloride:  109 (01/07/2009)   CO2:  23 (01/07/2009)   BUN:  4 (01/07/2009)   Creatinine:  0.75 (01/07/2009)   Albumin:  1.7 (12/27/2008)   Total Protein:  5.2 (12/27/2008)   Calcium:  7.4 (01/07/2009)   Total Bili:  0.7 (12/27/2008)   Alk Phos:  139 (12/27/2008)   SGPT (ALT):  13 (12/27/2008)   SGOT (AST):  21 (12/27/2008)   Current Medications (verified): 1)  Diovan Hct 160-25 Mg Tabs (Valsartan-Hydrochlorothiazide) .Marland Kitchen.. 1 Qd 2)  Estradiol 2 Mg Tabs (Estradiol) .Marland Kitchen.. 1 Qd 3)  Synthroid 25 Mcg Tabs (Levothyroxine Sodium) .... Once Daily- 4)  Tandem Plus 162-115.2-1 Mg Caps (Fefum-Fepo-Fa-B Cmp-C-Zn-Mn-Cu) .... Take 1 Capsule By Mouth Once A Day 5)  Metoprolol Tartrate 25 Mg  Tabs (Metoprolol Tartrate) .... Two Times A Day For Blood Pressure 6)  Fluoxetine Hcl 20 Mg  Tabs (Fluoxetine Hcl) .Marland Kitchen.. 1 By Mouth Once Daily 7)  Metanx 2.8-25-2 Mg  Tabs (L-Methylfolate-B6-B12) .... 2 Once Daily 8)  Alprazolam 0.25 Mg  Tbdp (Alprazolam) .... Dr [address] 9)  Ms Contin 30 Mg  Tb12 (Morphine Sulfate) .... Dr [address] 10)  Claritin 10 Mg Tabs (Loratadine) .Marland Kitchen.. 1 By Mouth Once Daily As Needed For Itch 11)  Align  Caps (Probiotic Product) .... Take 1 By Mouth Once Daily  Allergies (verified): 1)  ! Pcn  Past History:  Past Medical History: Hypertension Hypothyroidism back pain Hyperlipidemia Osteopenia Coronary artery disease Reflex sympathetic dystrophy of the (R) hand  MD roster: pain -[facility] - [facility]  Past Surgical History: Hysterectomy  1970 thyroidectomy lumbar laminectomy 5/10 - [facility] Cholecystectomy 07/2009 -[facility]  Review of Systems  The patient denies anorexia, fever, chest pain, melena, hematochezia, severe indigestion/heartburn, incontinence, and suspicious skin lesions.    Physical Exam  General:  alert,  well-developed, well-nourished, and cooperative to examination. nontoxic Eyes:  vision grossly intact; pupils equal, round and reactive to light.  conjunctiva and lids normal.   no jaundice Lungs:  normal respiratory effort, no intercostal retractions or use of accessory muscles; normal breath sounds bilaterally - no crackles and no wheezes.    Heart:  normal rate, regular rhythm, no murmur, and no rub. BLE without edema Abdomen:  well healed RUQ open chole scar, no inflammation. firm seroma-like fullness epigastric region, tender along right abd and epigastric regions to mod deep palp, no r/g , +BS   Impression & Recommendations:  Problem # 1:  ABDOMINAL PAIN, GENERALIZED (ICD-789.07) s/p chole 07/2009 for perforated GB - dc summary and op note reiewed today - continued pain - ?seroma on exam check labs and re-image with CT - suggested f/u with gen surg - will await results of these tests Orders: TLB-BMP (Basic Metabolic Panel-BMET) (99991111) TLB-CBC Platelet - w/Differential (85025-CBCD) TLB-Hepatic/Liver Function Pnl (80076-HEPATIC) Misc. Referral (Misc. Ref)  Problem # 2:  CHRONIC PAIN SYNDROME (ICD-338.4)  Problem # 3:  HYPOTHYROIDISM (ICD-244.9)  Her updated medication list for this problem includes:    Synthroid 25 Mcg Tabs (Levothyroxine sodium) ..... Once daily-  Orders: TLB-TSH (Thyroid Stimulating Hormone) (84443-TSH)  Labs Reviewed: TSH: 2.37 (12/13/2008)    Chol: 196 (12/13/2008)   HDL: 47.90 (12/13/2008)   LDL: 123 (12/13/2008)   TG: 128.0 (12/13/2008)  Problem # 4:  HYPERTENSION (ICD-401.9)  Her updated medication list for this problem includes:    Diovan Hct 160-25 Mg Tabs (Valsartan-hydrochlorothiazide) .Marland Kitchen... 1 qd    Metoprolol Tartrate 25 Mg Tabs (Metoprolol tartrate) .Marland Kitchen..Marland Kitchen Two times a day for blood pressure  BP today: 124/68 Prior BP: 126/68 (01/31/2009)  Labs Reviewed: K+: 4.3 (01/07/2009) Creat: : 0.75 (01/07/2009)   Chol: 196 (12/13/2008)   HDL:  47.90 (12/13/2008)   LDL: 123 (12/13/2008)   TG: 128.0 (12/13/2008)  Complete Medication List: 1)  Diovan Hct 160-25 Mg Tabs (Valsartan-hydrochlorothiazide) .Marland Kitchen.. 1 qd 2)  Estradiol 2 Mg Tabs (Estradiol) .Marland Kitchen.. 1 qd 3)  Synthroid 25 Mcg Tabs (Levothyroxine sodium) .... Once daily- 4)  Tandem Plus 162-115.2-1 Mg Caps (Fefum-fepo-fa-b cmp-c-zn-mn-cu) .... Take 1 capsule by mouth once a day 5)  Metoprolol Tartrate 25 Mg Tabs (Metoprolol tartrate) .... Two times a day for blood pressure 6)  Fluoxetine Hcl 20 Mg Tabs (Fluoxetine hcl) .Marland Kitchen.. 1 by mouth once daily 7)  Metanx 2.8-25-2 Mg Tabs (L-methylfolate-b6-b12) .... 2 once daily 8)  Alprazolam 0.25 Mg Tbdp (Alprazolam) .... Dr Hardin Negus fills 9)  Ms Contin 30 Mg Tb12 (Morphine sulfate) .... Dr Hardin Negus fills 10)  Claritin 10 Mg Tabs (Loratadine) .Marland Kitchen.. 1 by mouth once daily as needed for itch 11)  Align Caps (Probiotic product) .Marland KitchenMarland KitchenMarland Kitchen  Take 1 by mouth once daily  Patient Instructions: 1)  it was good to see you today. 2)  test(s) ordered today - your results will be called to you in 48-72 hours from the time of test completion after review 3)  we'll make referral for CT scan of abdomen and pelvis to look for cause of pain/problems . Our office will contact you regarding this appointment once made.  4)  refills as discussed - 5)  Please schedule a follow-up appointment in 6 months for thyroid and blood pressure, sooner if problems.  Prescriptions: METANX 2.8-25-2 MG  TABS (L-METHYLFOLATE-B6-B12) 2 once daily  #60 x 5   Entered by:   Tomma Lightning   Authorized by:   Rowe Clack MD   Signed by:   Tomma Lightning on 01/09/2010   Method used:   [facility] to        [facility] [address] [phone]* (retail)       [address]       [place], [place]  [address]       Ph: [phone] or [phone]       Fax: [phone]   RxID:   [id number] TANDEM PLUS 162-115.2-1 MG CAPS (FEFUM-FEPO-FA-B CMP-C-ZN-MN-CU) Take 1 capsule by  mouth once a day  #30 x 5   Entered by:   Tomma Lightning   Authorized by:   Rowe Clack MD   Signed by:   Tomma Lightning on 01/09/2010   Method used:   [facility]        [facility] [address] [phone]* (retail)       [address]       [place], [place]  [address]       Ph: [phone] or [phone]       Fax: [phone]   RxID:   [id number]

## 2010-08-19 NOTE — Progress Notes (Signed)
Summary: loratadine  Phone Note Refill Request Message from:  Fax from Pharmacy on Nov 21, 2009 9:57 AM  Refills Requested: Medication #1:  CLARITIN 10 MG TABS 1 by mouth once daily as needed for itch.  Method Requested: Electronic Initial call taken by: Tomma Lightning,  Nov 21, 2009 9:57 AM    Prescriptions: CLARITIN 10 MG TABS (LORATADINE) 1 by mouth once daily as needed for itch  #30 x 1   Entered by:   Tomma Lightning   Authorized by:   Rowe Clack MD   Signed by:   Tomma Lightning on 11/21/2009   Method used:   [facility]ron[facility]        [facility] [address] [phone]* (retail)       [address]       [place], [place]  [address]       Ph: [phone] or [phone]       Fax: [phone]   RxID:   [id number]

## 2010-08-19 NOTE — Progress Notes (Signed)
Summary: Valerie Mcclure now pt of [name]  ---- Converted from flag ---- ---- 12/17/2009 11:03 AM, Braulio Bosch wrote: I called pt to sch cpx and she said that she has changed doctors to [facility].   ---- 12/17/2009 7:25 AM, Levora Angel, RN wrote: good am would you sch Valerie Mcclure cpx and needs to be fasting thanks. ------------------------------

## 2010-08-19 NOTE — Progress Notes (Signed)
Summary: Referral  Phone Note Call from Patient Call back at Home Phone [phone]   Caller: Patient Summary of Call: Pt called requesting a referral to GI. Pt states she believes she is having complications form Gallbladder removal 07/2009. Pt has followed up with surgeon but was not satisfied.  Initial call taken by: Crissie Sickles, CMA,  April 15, 2010 2:21 PM  Follow-up for Phone Call        ok - gi refer done Follow-up by: Rowe Clack MD,  April 15, 2010 3:14 PM  Additional Follow-up for Phone Call Additional follow up Details #1::        Pt informed and will expect a call from [facility] Additional Follow-up by: Crissie Sickles, CMA,  April 15, 2010 3:17 PM

## 2010-08-19 NOTE — Progress Notes (Signed)
Summary: Rx refill req  Phone Note Call from Patient Call back at Home Phone [phone]   Caller: Patient Summary of Call: pt called requesting 30day supply for Synthroid to last until appt with VAL. Rx sent, pt informed. Initial call taken by: Crissie Sickles, CMA,  Dec 17, 2009 11:57 AM    Prescriptions: SYNTHROID 25 MCG TABS (LEVOTHYROXINE SODIUM) once daily- Brand medically necessary #30 x 0   Entered by:   Crissie Sickles, CMA   Authorized by:   Rowe Clack MD   Signed by:   Crissie Sickles, CMA on 12/17/2009   Method used:   [facility]        [facility] [address] [phone]* (retail)       [address]       [place], [place]  [address]       Ph: [phone] or [phone]       Fax: [phone]   RxID:   [id number]

## 2010-08-19 NOTE — Assessment & Plan Note (Signed)
Summary: ABD PAIN/YF   History of Present Illness Visit Type: Initial Consult Primary GI MD: Silvano Rusk MD [facility] Primary Provider: Rowe Clack MD Requesting Provider: Gwendolyn Grant, MD Chief Complaint: Pain that start in the incisional site for the Cholecystectomy and radiates to left upper abd and to the middle of the back. Pt states she has not seen Dr. Rise Patience after surgery but another doctor at [place]. History of Present Illness:   70 yo ww with cholecystectomy in January 2011. She had ack sugery by Dr. Patrice Paradise at [facility], then UTI and sepsis syndrome (SEE ECHART) while hospitalized and found to have gallstones. she ended up with with a cholecystectomy after discharge. She denies that se was having pain then but now is having pain starting in the incision and radiates across into left upperquadrant and back. it is intermittent, related movememnt or physial activity but not coughing. It disturbs sleep if on back it will start. On side it is worse. Says she never had til cholecystectomy. however notes indicate she did have some abdominal pain in Dec but she says coudn't tell for sure because she was having back pain also Back surgery was lumbar fusions, and that was successfully.  Months of advil and 1 month of Neurontin not helpful. Dr. Hardin Negus found numness in LUQ and told her it was likely a spine issue.   GI Review of Systems    Reports abdominal pain.     Location of  Abdominal pain: RUQ.    Denies acid reflux, belching, bloating, chest pain, dysphagia with liquids, dysphagia with solids, heartburn, loss of appetite, nausea, vomiting, vomiting blood, weight loss, and  weight gain.        Denies anal fissure, black tarry stools, change in bowel habit, constipation, diarrhea, diverticulosis, fecal incontinence, heme positive stool, hemorrhoids, irritable bowel syndrome, jaundice, light color stool, liver problems, rectal bleeding, and  rectal pain. Preventive Screening-Counseling &  Management  Alcohol-Tobacco     Smoking Status: never      Drug Use:  no.      Clinical Reports Reviewed:  Colonoscopy:  07/20/2002:  normal  06/20/2002:  Diverticulosis.  Hemorrhoids, External.   Performed by Dr. Rachelle Hora  Flexible Sigmoidoscopy  Procedure date:  01/15/2009  Findings:      Diverticulosis Hemorrhoid Nornmal random bxs  Dr. Benson Norway   Current Medications (verified): 1)  Diovan Hct 160-25 Mg Tabs (Valsartan-Hydrochlorothiazide) .Marland Kitchen.. 1 Qd 2)  Estradiol 2 Mg Tabs (Estradiol) .Marland Kitchen.. 1 Qd 3)  Synthroid 25 Mcg Tabs (Levothyroxine Sodium) .... Once Daily- 4)  Tandem Plus 162-115.2-1 Mg Caps (Fefum-Fepo-Fa-B Cmp-C-Zn-Mn-Cu) .... Take 1 Capsule By Mouth Once A Day 5)  Metoprolol Tartrate 25 Mg  Tabs (Metoprolol Tartrate) .... Two Times A Day For Blood Pressure 6)  Fluoxetine Hcl 20 Mg  Tabs (Fluoxetine Hcl) .Marland Kitchen.. 1 By Mouth Once Daily 7)  Metanx 2.8-25-2 Mg  Tabs (L-Methylfolate-B6-B12) .... 2 Once Daily 8)  Alprazolam 0.25 Mg  Tbdp (Alprazolam) .... Dr [address] 9)  Ms Contin 30 Mg  Tb12 (Morphine Sulfate) .... Dr [address] 10)  Claritin 10 Mg Tabs (Loratadine) .Marland Kitchen.. 1 By Mouth Once Daily As Needed For Itch 11)  Align  Caps (Probiotic Product) .... Take 1 By Mouth Once Daily 12)  Neurontin 100 Mg Caps (Gabapentin) .... One Tablet By Mouth Three Times A Day  Allergies (verified): 1)  ! Pcn  Past History:  Past Medical History: Hypertension Hypothyroidism back pain Hyperlipidemia Osteopenia Coronary artery disease Reflex sympathetic dystrophy  of the (R) hand Urosepsis after lumbar surgery  MD roster: pain -[facility] - [facility] GI-Anvay Tennis  Past Surgical History: Reviewed history from 01/09/2010 and no changes required. Hysterectomy  1970 thyroidectomy lumbar laminectomy 5/10 - [facility] Cholecystectomy 07/2009 -[place]  Family History: No FH of Colon Cancer: Family History of Heart Disease: Father  Social  History: Married Patient has never smoked.  Alcohol Use - no Illicit Drug Use - no Daily Caffeine Use Smoking Status:  never Drug Use:  no  Review of Systems       The patient complains of back pain.  The patient denies allergy/sinus, anemia, anxiety-new, arthritis/joint pain, blood in urine, breast changes/lumps, change in vision, confusion, cough, coughing up blood, depression-new, fainting, fatigue, fever, headaches-new, hearing problems, heart murmur, heart rhythm changes, itching, menstrual pain, muscle pains/cramps, night sweats, nosebleeds, pregnancy symptoms, shortness of breath, skin rash, sleeping problems, sore throat, swelling of feet/legs, swollen lymph glands, thirst - excessive , urination - excessive , urination changes/pain, urine leakage, vision changes, and voice change.    Vital Signs:  Patient profile:   70 year old female Height:      62 inches Weight:      172.50 pounds BMI:     31.66 Pulse rate:   60 / minute Pulse rhythm:   regular BP sitting:   126 / 88  (left arm) Cuff size:   regular  Vitals Entered By: Marlon Pel CMA Deborra Medina) (April 18, 2010 2:01 PM)  Physical Exam  General:  alert, well-developed, well-nourished, and cooperative to examination. nontoxic Eyes:  PERRLA, no icterus. Mouth:  No deformity or lesions, dentition normal. Neck:  Supple; no masses or thyromegaly. Chest Wall:  tender Lungs:  Clear throughout to auscultation. Heart:  Regular rate and rhythm; no murmurs, rubs,  or bruits. Abdomen:  soft RUQ scar open chole RUQ is slightly tender LUQ and xiphoid tendermoreso pain increases with muscle tension no HSM, mass Msk:  tender over mid thoracic spine left CVAT  Extremities:  no edema Neurologic:  Alert and  oriented x 3 Cervical Nodes:  No significant cervical or supraclavicular adenopathy.  Psych:  Alert and cooperative. Normal mood and affect.   Impression & Recommendations:  Problem # 1:  ABDOMINAL PAIN,  GENERALIZED (ICD-789.07) Assessment Comment Only New to me: coming from T spine I think based upon history and exam do not think from cholecystectomy  i have recommend she return to Dr. Patrice Paradise to assess this and I can see her as needed  Problem # 2:  PAIN IN THORACIC SPINE (ICD-724.1) Assessment: New She should see Dr. Patrice Paradise about this as above.  Patient Instructions: 1)  Please return to Dr. Rennis Harding for further eval of your abdominal pain. 2)  Copy sent to : Gwendolyn Grant, MD; Rennis Harding, MD; Jeanella Anton, MD 3)  The medication list was reviewed and reconciled.  All changed / newly prescribed medications were explained.  A complete medication list was provided to the patient / caregiver.

## 2010-10-05 LAB — COMPREHENSIVE METABOLIC PANEL
ALT: 16 U/L (ref 0–35)
AST: 19 U/L (ref 0–37)
Albumin: 2.5 g/dL — ABNORMAL LOW (ref 3.5–5.2)
Albumin: 3.3 g/dL — ABNORMAL LOW (ref 3.5–5.2)
Alkaline Phosphatase: 59 U/L (ref 39–117)
Alkaline Phosphatase: 65 U/L (ref 39–117)
BUN: 10 mg/dL (ref 6–23)
CO2: 32 mEq/L (ref 19–32)
Calcium: 7.9 mg/dL — ABNORMAL LOW (ref 8.4–10.5)
Chloride: 100 mEq/L (ref 96–112)
Creatinine, Ser: 0.85 mg/dL (ref 0.4–1.2)
Creatinine, Ser: 0.95 mg/dL (ref 0.4–1.2)
GFR calc Af Amer: >60 mL/min (ref >60)
GFR calc non Af Amer: >60 mL/min (ref >60)
Glucose, Bld: 148 mg/dL — ABNORMAL HIGH (ref 70–99)
Potassium: 4.2 mEq/L (ref 3.5–5.1)
Potassium: 4.3 mEq/L (ref 3.5–5.1)
Sodium: 137 mEq/L (ref 135–145)
Total Bilirubin: 0.4 mg/dL (ref 0.3–1.2)
Total Protein: 5.7 g/dL — ABNORMAL LOW (ref 6.0–8.3)

## 2010-10-05 LAB — DIFFERENTIAL
Basophils Absolute: 0 10*3/uL (ref 0.0–0.1)
Basophils Relative: 0 % (ref 0–1)
Eosinophils Absolute: 0.1 10*3/uL (ref 0.0–0.7)
Eosinophils Relative: 2 % (ref 0–5)
Monocytes Absolute: 0.4 10*3/uL (ref 0.1–1.0)

## 2010-10-05 LAB — CBC
HCT: 30.8 % — ABNORMAL LOW (ref 36.0–46.0)
Hemoglobin: 10.6 g/dL — ABNORMAL LOW (ref 12.0–15.0)
MCHC: 34.3 g/dL (ref 30.0–36.0)
MCV: 89.7 fL (ref 78.0–100.0)
MCV: 90.7 fL (ref 78.0–100.0)
Platelets: 239 10*3/uL (ref 150–400)
Platelets: 293 10*3/uL (ref 150–400)
RBC: 4.07 MIL/uL (ref 3.87–5.11)
RDW: 14.4 % (ref 11.5–15.5)
WBC: 6.4 10*3/uL (ref 4.0–10.5)

## 2010-10-13 ENCOUNTER — Other Ambulatory Visit: Payer: Self-pay | Admitting: Internal Medicine

## 2010-10-13 ENCOUNTER — Other Ambulatory Visit: Payer: Self-pay | Admitting: Family Medicine

## 2010-10-27 LAB — EHEC TOXIN BY EIA, STOOL

## 2010-10-27 LAB — COMPREHENSIVE METABOLIC PANEL
AST: 17 U/L (ref 0–37)
AST: 21 U/L (ref 0–37)
Albumin: 1.7 g/dL — ABNORMAL LOW (ref 3.5–5.2)
Albumin: 1.9 g/dL — ABNORMAL LOW (ref 3.5–5.2)
Alkaline Phosphatase: 143 U/L — ABNORMAL HIGH (ref 39–117)
BUN: 42 mg/dL — ABNORMAL HIGH (ref 6–23)
CO2: 25 mEq/L (ref 19–32)
Calcium: 7.7 mg/dL — ABNORMAL LOW (ref 8.4–10.5)
Chloride: 86 mEq/L — ABNORMAL LOW (ref 96–112)
Chloride: 99 mEq/L (ref 96–112)
Creatinine, Ser: 1.59 mg/dL — ABNORMAL HIGH (ref 0.4–1.2)
Creatinine, Ser: 3.75 mg/dL — ABNORMAL HIGH (ref 0.4–1.2)
GFR calc Af Amer: 14 mL/min — ABNORMAL LOW (ref >60)
GFR calc non Af Amer: 32 mL/min — ABNORMAL LOW (ref >60)
Potassium: 3.6 mEq/L (ref 3.5–5.1)
Total Bilirubin: 0.3 mg/dL (ref 0.3–1.2)
Total Protein: 5.2 g/dL — ABNORMAL LOW (ref 6.0–8.3)

## 2010-10-27 LAB — BASIC METABOLIC PANEL
BUN: 28 mg/dL — ABNORMAL HIGH (ref 6–23)
CO2: 24 mEq/L (ref 19–32)
CO2: 25 mEq/L (ref 19–32)
CO2: 26 mEq/L (ref 19–32)
CO2: 26 mEq/L (ref 19–32)
CO2: 27 mEq/L (ref 19–32)
CO2: 30 mEq/L (ref 19–32)
Calcium: 6.9 mg/dL — ABNORMAL LOW (ref 8.4–10.5)
Calcium: 7.4 mg/dL — ABNORMAL LOW (ref 8.4–10.5)
Calcium: 7.4 mg/dL — ABNORMAL LOW (ref 8.4–10.5)
Calcium: 7.5 mg/dL — ABNORMAL LOW (ref 8.4–10.5)
Calcium: 7.5 mg/dL — ABNORMAL LOW (ref 8.4–10.5)
Calcium: 7.7 mg/dL — ABNORMAL LOW (ref 8.4–10.5)
Calcium: 7.7 mg/dL — ABNORMAL LOW (ref 8.4–10.5)
Calcium: 7.9 mg/dL — ABNORMAL LOW (ref 8.4–10.5)
Calcium: 8.2 mg/dL — ABNORMAL LOW (ref 8.4–10.5)
Chloride: 107 mEq/L (ref 96–112)
Chloride: 109 mEq/L (ref 96–112)
Chloride: 108 mEq/L (ref 96–112)
Chloride: 86 mEq/L — ABNORMAL LOW (ref 96–112)
Creatinine, Ser: 0.75 mg/dL (ref 0.4–1.2)
Creatinine, Ser: 2.12 mg/dL — ABNORMAL HIGH (ref 0.4–1.2)
Creatinine, Ser: 2.92 mg/dL — ABNORMAL HIGH (ref 0.4–1.2)
Creatinine, Ser: 3.98 mg/dL — ABNORMAL HIGH (ref 0.4–1.2)
GFR calc Af Amer: >60 mL/min (ref >60)
GFR calc Af Amer: >60 mL/min (ref >60)
GFR calc Af Amer: >60 mL/min (ref >60)
GFR calc Af Amer: >60 mL/min (ref >60)
GFR calc Af Amer: 18 mL/min — ABNORMAL LOW (ref >60)
GFR calc Af Amer: 19 mL/min — ABNORMAL LOW (ref >60)
GFR calc Af Amer: 28 mL/min — ABNORMAL LOW (ref >60)
GFR calc Af Amer: 56 mL/min — ABNORMAL LOW (ref >60)
GFR calc Af Amer: >60 mL/min (ref >60)
GFR calc non Af Amer: >60 mL/min (ref >60)
GFR calc non Af Amer: >60 mL/min (ref >60)
GFR calc non Af Amer: 16 mL/min — ABNORMAL LOW (ref >60)
GFR calc non Af Amer: 23 mL/min — ABNORMAL LOW (ref >60)
GFR calc non Af Amer: 43 mL/min — ABNORMAL LOW (ref >60)
GFR calc non Af Amer: 51 mL/min — ABNORMAL LOW (ref >60)
Glucose, Bld: 170 mg/dL — ABNORMAL HIGH (ref 70–99)
Glucose, Bld: 98 mg/dL (ref 70–99)
Glucose, Bld: 104 mg/dL — ABNORMAL HIGH (ref 70–99)
Glucose, Bld: 113 mg/dL — ABNORMAL HIGH (ref 70–99)
Glucose, Bld: 120 mg/dL — ABNORMAL HIGH (ref 70–99)
Glucose, Bld: 126 mg/dL — ABNORMAL HIGH (ref 70–99)
Potassium: 3.2 mEq/L — ABNORMAL LOW (ref 3.5–5.1)
Potassium: 3.2 mEq/L — ABNORMAL LOW (ref 3.5–5.1)
Sodium: 135 mEq/L (ref 135–145)
Sodium: 137 mEq/L (ref 135–145)
Sodium: 138 mEq/L (ref 135–145)
Sodium: 125 mEq/L — ABNORMAL LOW (ref 135–145)
Sodium: 125 mEq/L — ABNORMAL LOW (ref 135–145)
Sodium: 126 mEq/L — ABNORMAL LOW (ref 135–145)
Sodium: 131 mEq/L — ABNORMAL LOW (ref 135–145)
Sodium: 136 mEq/L (ref 135–145)
Sodium: 137 mEq/L (ref 135–145)
Sodium: 140 mEq/L (ref 135–145)

## 2010-10-27 LAB — CLOSTRIDIUM DIFFICILE EIA: C difficile Toxins A+B, EIA: NEGATIVE

## 2010-10-27 LAB — CBC
HCT: 27.2 % — ABNORMAL LOW (ref 36.0–46.0)
HCT: 29.1 % — ABNORMAL LOW (ref 36.0–46.0)
HCT: 30 % — ABNORMAL LOW (ref 36.0–46.0)
Hemoglobin: 10 g/dL — ABNORMAL LOW (ref 12.0–15.0)
Hemoglobin: 10.3 g/dL — ABNORMAL LOW (ref 12.0–15.0)
Hemoglobin: 9.3 g/dL — ABNORMAL LOW (ref 12.0–15.0)
Hemoglobin: 9.4 g/dL — ABNORMAL LOW (ref 12.0–15.0)
Hemoglobin: 10.4 g/dL — ABNORMAL LOW (ref 12.0–15.0)
Hemoglobin: 9.1 g/dL — ABNORMAL LOW (ref 12.0–15.0)
MCHC: 33.9 g/dL (ref 30.0–36.0)
MCHC: 34.3 g/dL (ref 30.0–36.0)
MCHC: 34.3 g/dL (ref 30.0–36.0)
MCHC: 34.3 g/dL (ref 30.0–36.0)
MCHC: 34.1 g/dL (ref 30.0–36.0)
MCHC: 34.4 g/dL (ref 30.0–36.0)
MCHC: 35.5 g/dL (ref 30.0–36.0)
MCV: 90.8 fL (ref 78.0–100.0)
MCV: 91.6 fL (ref 78.0–100.0)
MCV: 91.7 fL (ref 78.0–100.0)
Platelets: 389 10*3/uL (ref 150–400)
Platelets: 222 10*3/uL (ref 150–400)
Platelets: 245 10*3/uL (ref 150–400)
Platelets: 273 10*3/uL (ref 150–400)
Platelets: 278 10*3/uL (ref 150–400)
Platelets: 341 10*3/uL (ref 150–400)
RBC: 3 MIL/uL — ABNORMAL LOW (ref 3.87–5.11)
RBC: 3 MIL/uL — ABNORMAL LOW (ref 3.87–5.11)
RBC: 3.2 MIL/uL — ABNORMAL LOW (ref 3.87–5.11)
RBC: 3.2 MIL/uL — ABNORMAL LOW (ref 3.87–5.11)
RBC: 3.3 MIL/uL — ABNORMAL LOW (ref 3.87–5.11)
RBC: 2.9 MIL/uL — ABNORMAL LOW (ref 3.87–5.11)
RBC: 3.27 MIL/uL — ABNORMAL LOW (ref 3.87–5.11)
RDW: 14 % (ref 11.5–15.5)
RDW: 14.1 % (ref 11.5–15.5)
RDW: 14.5 % (ref 11.5–15.5)
RDW: 12.8 % (ref 11.5–15.5)
RDW: 13.3 % (ref 11.5–15.5)
RDW: 13.4 % (ref 11.5–15.5)
RDW: 13.5 % (ref 11.5–15.5)
RDW: 13.8 % (ref 11.5–15.5)
WBC: 14.2 10*3/uL — ABNORMAL HIGH (ref 4.0–10.5)
WBC: 26.7 10*3/uL — ABNORMAL HIGH (ref 4.0–10.5)
WBC: 12.5 10*3/uL — ABNORMAL HIGH (ref 4.0–10.5)
WBC: 16.8 10*3/uL — ABNORMAL HIGH (ref 4.0–10.5)
WBC: 18.5 10*3/uL — ABNORMAL HIGH (ref 4.0–10.5)
WBC: 20 10*3/uL — ABNORMAL HIGH (ref 4.0–10.5)

## 2010-10-27 LAB — DIFFERENTIAL
Basophils Absolute: 0 10*3/uL (ref 0.0–0.1)
Basophils Absolute: 0 10*3/uL (ref 0.0–0.1)
Basophils Absolute: 0 10*3/uL (ref 0.0–0.1)
Basophils Relative: 0 % (ref 0–1)
Basophils Relative: 0 % (ref 0–1)
Basophils Relative: 0 % (ref 0–1)
Basophils Relative: 0 % (ref 0–1)
Eosinophils Absolute: 0 10*3/uL (ref 0.0–0.7)
Eosinophils Relative: 0 % (ref 0–5)
Eosinophils Relative: 0 % (ref 0–5)
Lymphocytes Relative: 8 % — ABNORMAL LOW (ref 12–46)
Lymphocytes Relative: 3 % — ABNORMAL LOW (ref 12–46)
Lymphocytes Relative: 4 % — ABNORMAL LOW (ref 12–46)
Monocytes Absolute: 1.1 10*3/uL — ABNORMAL HIGH (ref 0.1–1.0)
Monocytes Absolute: 0.7 10*3/uL (ref 0.1–1.0)
Monocytes Absolute: 0.7 10*3/uL (ref 0.1–1.0)
Monocytes Absolute: 0.8 10*3/uL (ref 0.1–1.0)
Monocytes Absolute: 1.5 10*3/uL — ABNORMAL HIGH (ref 0.1–1.0)
Monocytes Relative: 4 % (ref 3–12)
Monocytes Relative: 8 % (ref 3–12)
Neutro Abs: 26.2 10*3/uL — ABNORMAL HIGH (ref 1.7–7.7)
Neutro Abs: 17.8 10*3/uL — ABNORMAL HIGH (ref 1.7–7.7)
Neutro Abs: 19.5 10*3/uL — ABNORMAL HIGH (ref 1.7–7.7)
Neutro Abs: 21.9 10*3/uL — ABNORMAL HIGH (ref 1.7–7.7)
Neutrophils Relative %: 88 % — ABNORMAL HIGH (ref 43–77)
Neutrophils Relative %: 91 % — ABNORMAL HIGH (ref 43–77)
Neutrophils Relative %: 93 % — ABNORMAL HIGH (ref 43–77)

## 2010-10-27 LAB — AMYLASE: Amylase: 18 U/L — ABNORMAL LOW (ref 27–131)

## 2010-10-27 LAB — CARDIAC PANEL(CRET KIN+CKTOT+MB+TROPI)
CK, MB: 5.5 ng/mL — ABNORMAL HIGH (ref 0.3–4.0)
CK, MB: 5.7 ng/mL — ABNORMAL HIGH (ref 0.3–4.0)
Relative Index: INVALID (ref 0.0–2.5)
Relative Index: INVALID (ref 0.0–2.5)
Relative Index: INVALID (ref 0.0–2.5)
Relative Index: INVALID (ref 0.0–2.5)
Total CK: 35 U/L (ref 7–177)
Total CK: 49 U/L (ref 7–177)
Troponin I: 0.04 ng/mL (ref 0.00–0.06)
Troponin I: 0.05 ng/mL (ref 0.00–0.06)
Troponin I: 0.06 ng/mL (ref 0.00–0.06)

## 2010-10-27 LAB — URINE CULTURE
Colony Count: 30000
Colony Count: >=100000

## 2010-10-27 LAB — MAGNESIUM: Magnesium: 1 mg/dL — ABNORMAL LOW (ref 1.5–2.5)

## 2010-10-27 LAB — OVA AND PARASITE EXAMINATION: Ova and parasites: NONE SEEN

## 2010-10-27 LAB — URINALYSIS, ROUTINE W REFLEX MICROSCOPIC
Bilirubin Urine: NEGATIVE
Glucose, UA: NEGATIVE mg/dL
Hgb urine dipstick: NEGATIVE
Nitrite: NEGATIVE
Nitrite: POSITIVE — AB
Protein, ur: NEGATIVE mg/dL
Specific Gravity, Urine: 1.008 (ref 1.005–1.030)
Specific Gravity, Urine: 1.021 (ref 1.005–1.030)
Urobilinogen, UA: 0.2 mg/dL (ref 0.0–1.0)
pH: 5 (ref 5.0–8.0)

## 2010-10-27 LAB — CULTURE, BLOOD (ROUTINE X 2)

## 2010-10-27 LAB — URINE MICROSCOPIC-ADD ON

## 2010-10-27 LAB — PROTIME-INR: Prothrombin Time: 15.9 seconds — ABNORMAL HIGH (ref 11.6–15.2)

## 2010-10-27 LAB — HEPATIC FUNCTION PANEL
ALT: 15 U/L (ref 0–35)
AST: 19 U/L (ref 0–37)
Alkaline Phosphatase: 102 U/L (ref 39–117)
Bilirubin, Direct: 0.1 mg/dL (ref 0.0–0.3)
Total Bilirubin: 0.5 mg/dL (ref 0.3–1.2)

## 2010-10-27 LAB — HEPARIN LEVEL (UNFRACTIONATED): Heparin Unfractionated: 0.14 IU/mL — ABNORMAL LOW (ref 0.30–0.70)

## 2010-10-27 LAB — SEDIMENTATION RATE: Sed Rate: 38 mm/hr — ABNORMAL HIGH (ref 0–22)

## 2010-10-27 LAB — CK TOTAL AND CKMB (NOT AT ARMC): Total CK: 89 U/L (ref 7–177)

## 2010-10-27 LAB — LIPASE, BLOOD: Lipase: 12 U/L (ref 11–59)

## 2010-11-18 ENCOUNTER — Other Ambulatory Visit (INDEPENDENT_AMBULATORY_CARE_PROVIDER_SITE_OTHER): Payer: Medicare PPO

## 2010-11-18 ENCOUNTER — Encounter: Payer: Self-pay | Admitting: Internal Medicine

## 2010-11-18 ENCOUNTER — Other Ambulatory Visit (INDEPENDENT_AMBULATORY_CARE_PROVIDER_SITE_OTHER): Payer: Medicare PPO | Admitting: Internal Medicine

## 2010-11-18 ENCOUNTER — Ambulatory Visit (INDEPENDENT_AMBULATORY_CARE_PROVIDER_SITE_OTHER): Payer: Medicare PPO | Admitting: Internal Medicine

## 2010-11-18 ENCOUNTER — Telehealth: Payer: Self-pay | Admitting: Internal Medicine

## 2010-11-18 VITALS — BP 112/82 | HR 57 | Temp 98.1°F | Ht 62.0 in | Wt 172.0 lb

## 2010-11-18 DIAGNOSIS — E039 Hypothyroidism, unspecified: Secondary | ICD-10-CM

## 2010-11-18 DIAGNOSIS — I10 Essential (primary) hypertension: Secondary | ICD-10-CM

## 2010-11-18 DIAGNOSIS — M545 Low back pain: Secondary | ICD-10-CM | POA: Insufficient documentation

## 2010-11-18 DIAGNOSIS — E785 Hyperlipidemia, unspecified: Secondary | ICD-10-CM

## 2010-11-18 DIAGNOSIS — Z0000 Encounter for general adult medical examination without abnormal findings: Secondary | ICD-10-CM

## 2010-11-18 DIAGNOSIS — R300 Dysuria: Secondary | ICD-10-CM

## 2010-11-18 DIAGNOSIS — Z13220 Encounter for screening for lipoid disorders: Secondary | ICD-10-CM

## 2010-11-18 DIAGNOSIS — M8580 Other specified disorders of bone density and structure, unspecified site: Secondary | ICD-10-CM | POA: Insufficient documentation

## 2010-11-18 DIAGNOSIS — D649 Anemia, unspecified: Secondary | ICD-10-CM

## 2010-11-18 DIAGNOSIS — Z136 Encounter for screening for cardiovascular disorders: Secondary | ICD-10-CM

## 2010-11-18 DIAGNOSIS — Z23 Encounter for immunization: Secondary | ICD-10-CM

## 2010-11-18 DIAGNOSIS — Z79899 Other long term (current) drug therapy: Secondary | ICD-10-CM

## 2010-11-18 LAB — CBC WITH DIFFERENTIAL/PLATELET
Basophils Absolute: 0 10*3/uL (ref 0.0–0.1)
Eosinophils Absolute: 0.1 10*3/uL (ref 0.0–0.7)
HCT: 36.6 % (ref 36.0–46.0)
Lymphs Abs: 1.3 10*3/uL (ref 0.7–4.0)
Monocytes Relative: 7.6 % (ref 3.0–12.0)
Platelets: 226 10*3/uL (ref 150.0–400.0)
RDW: 12.7 % (ref 11.5–14.6)

## 2010-11-18 LAB — BASIC METABOLIC PANEL
BUN: 17 mg/dL (ref 6–23)
Chloride: 98 mEq/L (ref 96–112)
Creatinine, Ser: 1 mg/dL (ref 0.4–1.2)
GFR: 58.06 mL/min — ABNORMAL LOW (ref >60.00)

## 2010-11-18 LAB — LIPID PANEL
Cholesterol: 222 mg/dL — ABNORMAL HIGH (ref 0–200)
HDL: 47.7 mg/dL (ref >39.00)
VLDL: 36 mg/dL (ref 0.0–40.0)

## 2010-11-18 LAB — IRON AND TIBC
%SAT: 18 % — ABNORMAL LOW (ref 20–55)
Iron: 64 ug/dL (ref 42–145)

## 2010-11-18 LAB — URINALYSIS
Ketones, ur: NEGATIVE
Leukocytes, UA: NEGATIVE
Specific Gravity, Urine: 1.01 (ref 1.000–1.030)
Total Protein, Urine: NEGATIVE
Urine Glucose: NEGATIVE
pH: 7 (ref 5.0–8.0)

## 2010-11-18 LAB — VITAMIN B12: Vitamin B-12: 376 pg/mL (ref 211–911)

## 2010-11-18 LAB — TSH: TSH: 5.66 u[IU]/mL — ABNORMAL HIGH (ref 0.35–5.50)

## 2010-11-18 MED ORDER — FUROSEMIDE 20 MG PO TABS: 20.0000 mg | ORAL_TABLET | Freq: Every day | ORAL | Status: DC

## 2010-11-18 MED ORDER — PNEUMOCOCCAL VAC POLYVALENT 25 MCG/0.5ML IJ INJ
0.5000 mL | INJECTION | Freq: Once | INTRAMUSCULAR | Status: AC
  Administered 2010-11-18: 0.5 mL via INTRAMUSCULAR

## 2010-11-18 MED ORDER — LEVOTHYROXINE SODIUM 50 MCG PO TABS: 50.0000 ug | ORAL_TABLET | Freq: Every day | ORAL | Status: DC

## 2010-11-18 MED ORDER — SIMVASTATIN 10 MG PO TABS: 10.0000 mg | ORAL_TABLET | Freq: Every evening | ORAL | Status: DC

## 2010-11-18 NOTE — Progress Notes (Signed)
Subjective:    Patient ID: Valerie Mcclure, female    DOB: 09-Sep-1940, 70 y.o.   MRN: [id number]  HPI  Here for medicare wellness  Diet: heart healthy or DM if diabetic Physical activity: sedentary Depression/mood screen: negative Hearing: intact to whispered voice Visual acuity: grossly normal, performs annual eye exam  ADLs: capable Fall risk: none Home safety: good Cognitive evaluation: intact to orientation, naming, recall and repetition EOL planning: adv directives, full code/ I agree  I have personally reviewed and have noted 1. The patient's medical and social history 2. Their use of alcohol, tobacco or illicit drugs 3. Their current medications and supplements 4. The patient's functional ability including ADL's, fall risks, home safety risks and hearing or visual impairment. 5. Diet and physical activities 6. Evidence for depression or mood disorders  Also reviewed chronic med issues as well- hypothyroid  - reports compliance with ongoing medical treatment and no changes in medication dose or frequency. denies adverse side effects related to current therapy.   HTN - reports compliance with ongoing medical treatment and no changes in medication dose or frequency. denies adverse side effects related to current therapy.   Dyslipidemia - never on rx med for same - controls with diet efforts  Past Medical History  Diagnosis Date  . Unspecified hypothyroidism   . Chronic low back pain   . Unspecified essential hypertension   . Hyperlipidemia   . Osteopenia    Family History  Problem Relation Age of Onset  . Heart disease Father   . Cancer Neg Hx    History  Substance Use Topics  . Smoking status: Never Smoker   . Smokeless tobacco: Not on file  . Alcohol Use: No    Review of Systems  Constitutional: Negative for fever. complains of weight gain and fatigue. Respiratory: Negative for cough and shortness of breath.   Cardiovascular: Negative for chest  pain.  Gastrointestinal: Negative for abdominal pain.  Musculoskeletal: Negative for gait problem.  Skin: Negative for rash.  Neurological: Negative for dizziness.  GU: complains of strong urine odor and dysuria, no hematuria. No other specific complaints in a complete review of systems (except as listed in HPI above).     Objective:   Physical Exam BP 112/82  Pulse 57  Temp(Src) 98.1 F (36.7 C) (Oral)  Ht 5\' 2"  (1.575 m)  Wt 172 lb (78.019 kg)  BMI 31.46 kg/m2  SpO2 98% Physical Exam  Constitutional: She is oriented to person, place, and time. She appears well-developed and well-nourished. No distress.  HENT: Head: Normocephalic and atraumatic. Ears:ossly normal hearing, TMs clear B without effusion or erythema. Nose: Nose normal.  Mouth/Throat: Oropharynx is clear and moist. No oropharyngeal exudate.  Eyes: Conjunctivae and EOM are normal. Pupils are equal, round, and reactive to light. No scleral icterus.  Neck: Normal range of motion. Neck supple. No JVD present. No thyromegaly present.  Cardiovascular: Normal rate, regular rhythm and normal heart sounds.  No murmur heard. Pulmonary/Chest: Effort normal and breath sounds normal. No respiratory distress. She has no wheezes.  Abdominal: Soft. Bowel sounds are normal. She exhibits no distension. There is no tenderness.  Musculoskeletal: Normal range of motion. She exhibits no edema.  Neurological: She is alert and oriented to person, place, and time. No cranial nerve deficit. Coordination normal.  Skin: Skin is warm and dry. No rash noted. No erythema.  Psychiatric: She has a normal mood and affect. Her behavior is normal. Judgment and thought content normal.  Lab Results  Component Value Date   WBC 6.3 01/09/2010   HGB 12.7 01/09/2010   HCT 36.7 01/09/2010   PLT 310.0 01/09/2010   CHOL 196 12/13/2008   TRIG 128.0 12/13/2008   HDL 47.90 12/13/2008   LDLDIRECT 142.9 11/09/2007   ALT 15 01/09/2010   AST 19 01/09/2010   NA 136  01/09/2010   K 4.2 01/09/2010   CL 99 01/09/2010   CREATININE 1.1 01/09/2010   BUN 20 01/09/2010   CO2 29 01/09/2010   TSH 3.17 01/09/2010   INR 1.2 12/26/2008       Assessment & Plan:  AMW visit/CPX - Today patient counseled on age appropriate routine health concerns for screening and prevention, each reviewed and up to date or declined. Immunizations reviewed and up to date or declined. Labs/ECG reviewed. Risk factors for depression reviewed and negative. Hearing function and visual acuity are intact. ADLs screened and addressed as needed. Functional ability and level of safety reviewed and appropriate. Education, counseling and referrals performed based on assessed risks today. Patient provided with a copy of personalized plan for preventive services.  Also see problem list. Medications and labs reviewed today.

## 2010-11-18 NOTE — Assessment & Plan Note (Signed)
Never on rx treatment for same - recheck now with weight gain and HTN -

## 2010-11-18 NOTE — Patient Instructions (Signed)
It was good to see you today. pneumona vaccine given today - Add daily furosemide as needed for leg swelling - Your prescription(s) have been submitted to your pharmacy. Please take as directed and contact our office if you believe you are having problem(s) with the medication(s). Medications reviewed, no other changes at this time. Test(s) ordered today. Your results will be called to you after review (48-72hours after test completion). If any changes need to be made, you will be notified at that time. Work on lifestyle changes as discussed (low fat, low carb, increased protein diet; improved exercise efforts; weight loss) to control sugar, blood pressure and cholesterol levels and/or reduce risk of developing other medical problems. Look into [email or web address] or other type of food journal to assist you in this process. Please schedule followup in 6 months for blood pressure and weight check, call sooner if problems.

## 2010-11-18 NOTE — Telephone Encounter (Signed)
cpx labs reviewd - need to adjust thyroid dose due to abn TSH, increase synth to 66mcg qd - also need to start low dose cholesterol medication due high total and LDL cholesterol (222 and 136 respectively, goal <200 for tot and <100 for LDL) - simvastatin 10mg  qhs - erx both meds done - needs OV to recheck lipids, TSH and LFTs in 3 months please - call sooner if problems - thanks

## 2010-11-18 NOTE — Assessment & Plan Note (Signed)
Will add prn daily dose furosemide for chronic dep edema - erx done Otherwise, The current medical regimen is effective;  continue present plan and medications.  BP Readings from Last 3 Encounters:  11/18/10 112/82  06/24/10 120/70  04/18/10 126/88

## 2010-11-18 NOTE — Assessment & Plan Note (Signed)
complains of fatigue and weight gain - recheck TSH now and adjust as needed Lab Results  Component Value Date   TSH 3.17 01/09/2010

## 2010-11-18 NOTE — Telephone Encounter (Signed)
Notified pt with lab results & md recommendations. rx's sent to pharmacy already.Marland KitchenMarland KitchenMarland Kitchen5/1/12@2 :31pm/LMB

## 2010-11-18 NOTE — Assessment & Plan Note (Signed)
Hx same, now with fatigue - recheck CBC with iron and b12 now Lab Results  Component Value Date   WBC 6.3 01/09/2010   HGB 12.7 01/09/2010   HCT 36.7 01/09/2010   MCV 91.0 01/09/2010   PLT 310.0 01/09/2010

## 2010-12-02 NOTE — Consult Note (Signed)
NAMEMADELON, PERT           ACCOUNT NO.:  [id number]   MEDICAL RECORD NO.:  [id number]          PATIENT TYPE:  INP   LOCATION:  1232                         FACILITY:  [facility]   PHYSICIAN:  Orson Ape. Weatherly, M.D.DATE OF BIRTH:  03/24/1941   DATE OF CONSULTATION:  12/28/2008  DATE OF DISCHARGE:                                 CONSULTATION   CHIEF COMPLAINT:  Abdominal pain.   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  She was seen by the emergency room physician and  admitted by [facility].  It appears that she has a significant  urinary tract infection and started on intravenous fluids and  antibiotics.  She was seen today by Dr. Linda Hedges, her regular physician,  who examined her and thought she was mildly tender in the upper abdomen.  She also had an abdominal ultrasound, looking at the kidneys, but they  also looked at the gallbladder, and she definitely does have a stone in  her gallbladder and possibly a thickened, a little bit of  pericholecystatic fluid, and this may be kind of a subacute  cholecystitis.  The patient's husband gives more of the history than she  does, and it appears that the patient has episodes of basically  inability to swallow, not really pain several hours after eating, but  she did have an episode of right upper quadrant abdominal pain on the  day that she was admitted.  The patient has been on pretty high-dose  pain medication following a recent back surgery, and Dr. Linda Hedges when he  saw her earlier today discontinued her chronic pain medication, and she  also had an elevated BUN and creatinine when she was admitted.   On physical exam now, the patient is up sitting.  She has just  had some  dinner.  She is possibly slightly tender in the right upper quadrant but  says that she does not feel like the pain is severe.  She has an  abdominal binder on with her recent back surgery.   Her white count was 20,000 on admission.  It is now 19.8.  She is  presently on heparin, diuretics, and Levaquin.  I think that the cardiac  as the origin of any problems has been ruled out.  It is not thought  that her gallbladder is the main issue, but on review of the ultrasound,  she certainly does have a stone that was down towards the neck of the  gallbladder with a thickened gallbladder wall.  It is possible that  after her azotemia is corrected, she may actually need a cholecystectomy  this admission.  I would not make any plans about surgery immediately,  but I will re-examine her in the a.m. and I talked with her husband and  reviewed the x-rays with him.  Hopefully, her white count will continue  to come down and emergency surgery will not be needed.      Orson Ape. Rise Patience, M.D.  Electronically Signed     WJW/MEDQ  D:  12/28/2008  T:  12/29/2008  Job:  [id number]

## 2010-12-02 NOTE — Discharge Summary (Signed)
NAME:  Valerie Mcclure, Valerie Mcclure           ACCOUNT NO.:  [id number]   MEDICAL RECORD NO.:  [id number]          PATIENT TYPE:  INP   LOCATION:  1313                         FACILITY:  [facility]   PHYSICIAN:  Valerie A. Asa Lente, MDDATE OF BIRTH:  12/11/1940   DATE OF ADMISSION:  12/26/2008  DATE OF DISCHARGE:  01/07/2009                               DISCHARGE SUMMARY   DISCHARGE DIAGNOSES:  1. Acute diarrheal illness, suspected toxin negative Clostridium      difficile.  2. Possible cholecystitis.  3. Acute renal failure on admit.  4. Urinary tract infection.  5. Chronic pain.  6. Two periods of unresponsiveness during hospitalization, resolved      with assisted breathing and Narcan.   HISTORY OF PRESENT ILLNESS:  Valerie Mcclure is a 70 year old white female  with multiple medical problems who presented to the [facility]  Emergency Room on the day of admission with reports of nausea, vomiting  and abdominal pain.  Of note, the patient was status post level three  lumbar fusion 1 week prior to admission per Dr. Patrice Mcclure.  The patient  reported in the emergency room foul smelling and dark urine.  The family  did not increased confusion, as well as witnessed fall on the morning of  admission without syncope or loss of consciousness.  Upon evaluation in  the emergency room, the patient was found to be in acute renal failure  with a creatinine of 3.98 with significant leukocytosis with a white  cell count of 20.6 and sodium of 126.  The patient was admitted from the  emergency room with acute renal failure.   CONSULTATIONS DURING THIS ADMISSION:  1. [facility], Valerie D. Prime, MD regarding elevated      troponins.  Valerie [place] Mcclure, M.D., general surgery, regarding gallstones      seen on ultrasound.   PROCEDURES DURING THIS HOSPITALIZATION:  1. Chest x-ray performed June 9, revealing cardiomegaly with no acute      findings.  2. Renal ultrasound done December 27, 2008, revealing  medical renal      disease.  Also revealing cholelithiasis with gallbladder wall      thickening and pericholecystic fluid suggesting cholecystitis.  3. Repeat chest x-ray done June 20, revealing small, stable bilateral      pleural effusions.  4. Bilateral lower extremity venous Dopplers completed on December 27, 2008, negative for DVT.  5. Flexible sigmoidoscopy performed by Valerie Mcclure. Valerie Norway, MD on January 05, 2009, revealing normal colon with small hemorrhoids and      diverticula.  Biopsy result pending at the time of dictation.  6. A 2-D echocardiogram on December 27, 2008, revealing left ventricular      ejection fraction of 55% with no wall motion abnormalities.   PAST MEDICAL HISTORY:  1. Herniated lumbar disk status post lumbar disk fusion, June, 2010.  2. Left knee osteoarthritis.  3. Chronic lumbar back pain.  4. Hypothyroidism.  5. Osteopenia.  6. Hyperlipidemia.  7. Hypertension.  8. Status post thyroidectomy.  9. Status post hysterectomy.  10.Status post degenerative  joint disease.   COURSE OF HOSPITALIZATION:  1. Acute renal failure with pyelonephritis on admission.  Urine      culture obtained at the time of admission revealing no significant      growth.  The patient was treated empirically with 6 days of      Levaquin.  The patient was also taken off renal toxic medications      and to remain off diclofenac at the time of discharge.  As      mentioned above, renal ultrasound revealed medical renal disease      with no hydronephrosis.  The patient at this time will continue low-      dose diuretic therapy at discharge as prior to admission as the      patient's creatinine responded well to IV as the patient's      creatinine responded well to IV fluids and treatment of infection.  2. Acute diarrheal illness.  The patient did develop diarrhea shortly      after admission.  C. diff toxin cultures were negative x2.      However, GI consult agrees that the patient may  be suffering from      toxin-negative C. diff as the patient's symptoms have improved      significantly with p.o. vancomycin and Florastor.  In addition, the      patient's home narcotic medications were stopped at the time of      admission due to altered mental status and confusion.  Therefore,      the patient's diarrhea may have been exacerbated by narcotic      withdrawal.  Again, the patient's symptoms have greatly improved at      the time of dictation.  She is to continue p.o. vancomycin for a      total of 14 days treatment, as well as Florastor for a total of 2      weeks post discharge.  The patient is to follow up with      gastroenterology p.r.n.  The patient has been advised of potential      relapse of colitis.  3. Possible cholecystitis.  As mentioned above, gallstone was seen on      patient's renal ultrasound.  However, the patient remains      asymptomatic at this time.  Valerie Mcclure, M.D. did see the      patient in consultation during this hospital admission who felt no      need for emergent surgery.  The patient is to follow up in 6-8      weeks for possible elective cholecystectomy.  4. Chronic pain on narcotic medications prior to arrival.  As      mentioned above, the patient did have two episodes of      unresponsiveness during hospitalization that were quickly reversed      with assisted breathing, as well as Narcan given intravenously.      The patient is to be discharged home on a decreased dose of      morphine for chronic back pain.  She has been advised of potential      adverse effects of overuse of narcotic medication.  5. Elevated troponins on admit.  As mentioned above, cardiology      consult was obtained during this hospitalization.  The patient's      multiple medical problems at the time of admission could contribute      to non-ST elevation MI.  However, given  acute renal failure this      could also lead to the patient's elevated  troponin.  Cardiac      markers were cycled with no recurrent elevation in troponin.  The      patient was placed prophylactically on a heparin drip, as well as      continued on aspirin.  Again, the patient is to discontinue      diclofenac in the setting of nonobstructive coronary artery disease      and acute renal failure on admission.  No further workup is planned      at this time.   MEDICATIONS AT THE TIME OF DISCHARGE:  1. Vancomycin 250 mg p.o. q.i.d. x10 days.  2. Florastor 1 tab p.o. b.i.d. x2 weeks.  3. Metanx 2.8-25-2 p.o. b.i.d.  4. Alprazolam 0.25 mg p.o. b.i.d.  5. Diovan/hydrochlorothiazide 160/25 p.o. daily.  6. Fluoxetine 20 mg daily.  7. MS Contin 30 mg p.o. b.i.d.  8. Metoprolol 25 mg p.o. b.i.d.  9. Synthroid 25 mcg p.o. daily.  10.Estradiol 2 mg p.o. daily.  11.The patient was instructed to discontinue diclofenac.   DISPOSITION:  The patient was felt medically stable for discharge home  at this time.  The patient was instructed to follow up with Mateo Flow A.  Asa Lente, MD on Thursday, July 8, at 11 a.m. as she has requested a  change in primary care physician.   Greater than 30 minutes spent on discharge planning.      Patrici Ranks, NP      Jannifer Rodney. Asa Lente, MD  Electronically Signed    LE/MEDQ  D:  01/07/2009  T:  01/07/2009  Job:  [id number]

## 2010-12-02 NOTE — Consult Note (Signed)
NAME:  Valerie Mcclure, HENSHALL           ACCOUNT NO.:  [id number]   MEDICAL RECORD NO.:  [id number]          PATIENT TYPE:  INP   LOCATION:  1439                         FACILITY:  [facility]   PHYSICIAN:  Darryl D. Prime, MD    DATE OF BIRTH:  1941-07-06   DATE OF CONSULTATION:  DATE OF DISCHARGE:                                 CONSULTATION   Patient is admitted under [facility], Dr. Asa Lente.   CARDIOLOGIST:  He has seen Dr. Dannielle Burn in the past.   The attending of record for consult is Dr. Percival Spanish.   REASON FOR CONSULTATION:  Chest pain, positive cardiac markers.   HISTORY OF PRESENT ILLNESS:  Valerie Mcclure is a 70 year old female with a  history of coronary artery disease, nonobstructive, chest pain in July,  2002, had a cardiac catheterization showing proximal 20% LAD stenosis,  circumflex 60% proximal and 40% ostial RCA.  EF on LV gram was 75% with  no wall motion abnormalities at that time, history of hypothyroidism,  hyperlipidemia, anxiety disorder, who notes chest pain.  Patient first  had a lumbar fusion for back pain, and a Foley was placed at that time.  She had no problems after the surgery.  Within 2 days, she was walking,  doing well.  On the day prior to admission, in the evening, December 25, 2008, after eating dinner, she had an episode of chest pain and right-  sided pressure, associated with right-sided hip pain with associated  shortness of breath with nausea.  She has persistent vomiting and  confusion, then presented to the emergency room today.  The patient  notes the chest pain lasted for about 30 minutes.  She said her daughter  did give her CocaCola and Tums for the pain.  The patient in the  emergency room was noted to have renal failure with a creatinine of  3.98, which is new for her, BUN 37, sodium 126, white count 20.6 with  evidence of a urinary tract infection.  She was admitted for treatment  of a urinary tract infection.  She has had no chest pain since.   Cardiac  markers were ordered, however, and did show that her CK was 89, CK-MB  4.9, which is high, and the troponin was 0.125, which is higher than the  upper limits of normal.  The patient notes recent leg swelling left  greater than right, over the last few weeks with no shortness of breath,  treated prior to this last episode, or recent chest pain.   PAST MEDICAL/SURGICAL HISTORY:  1. History of right hand reflex sympathetic dystrophy.  2. History of osteopenia.  3. History of degenerative joint disease.  4. History of left knee arthritis.  5. History of dermatitis, not otherwise specified.  6. History of urinary tract infection.  7. Status post hysterectomy.  8. Status post hand surgery.  9. History of lumbar fusion a week ago, as above, for a slipped disk      and back pain.  10.Status post thyroidectomy.   SOCIAL HISTORY:  She is married.  Lives with her granddaughter.  No  tobacco, alcohol,  or illicit drug use.   Father had a myocardial infarction at age 29.   REVIEW OF SYSTEMS:  A 14-point review of systems is negative unless  stated above.   ALLERGIES:  PENICILLIN.   MEDICATIONS:  1. Alprazolam 0.25 mg twice daily.  2. MS Contin 60 mg twice daily.  3. MSIR 15 mg 4 times a day as needed.  4. Diovan/HCT 160/25 daily.  5. Fluoxetine 20 mg daily.  6. Estradiol 2 mg daily.  7. Metoprolol 25 mg twice daily.  8. Synthroid 25 mcg daily.  9. Diclofenac 75 mg twice daily.  10.Mentax 2 tabs p.o. daily.  11.She was started on Levaquin in-house.   REVIEW OF SYSTEMS:  A 14-point review of systems is negative unless  stated above.   PHYSICAL EXAMINATION:  VITAL SIGNS:  Temperature 97.7, pulse 77,  respiratory rate 16, blood pressure 99/67.  Sats are 95% on room air.  GENERAL:  She is a female lying flat in bed in no acute distress.  HEENT:  Normocephalic and atraumatic.  Pupils are equal, round and  reactive to light.  Extraocular movements are intact.  Oropharynx is  dry  with no posterior pharyngeal lesions.  NECK:  Supple with no evidence of thyromegaly or adenopathy.  No carotid  bruits.  No jugular venous distention.  CARDIOVASCULAR:  Regular rate and rhythm, distant.  LUNGS:  Clear to auscultation bilaterally.  ABDOMEN:  Soft, nontender, nondistended without hepatosplenomegaly.  EXTREMITIES:  No clubbing or cyanosis.  She has 1+ lower extremity edema  on the left.  NEUROLOGIC:  She is alert and oriented x4 with cranial nerves II-XII  grossly intact.  Strength and sensation are grossly intact.  The patient's skin shows no gross rashes.  LYMPHATIC:  Lower extremity edema, as above.  No anterior cervical  lymphadenopathy.  PSYCHIATRIC:  Normal mood and affect.  Alert and oriented x4.   LABORATORY DATA:  White count is 20.6 with a hemoglobin of 10.4,  hematocrit 30.5, platelets 299, segs 95%.  Cardiac markers at 1941 is as  above.  Sodium 136, potassium 3.6, chloride 81, bicarb 30, BUN 37,  creatinine 3.98, glucose 110, calcium 8.2.  Urinalysis shows WBCs 3 to  6, RBCs 3 to 6, bacteria many, positive yeast, moderate blood.  Also  moderate bilirubin, leukocyte esterase, nitrates, plus positive chest x-  ray shows cardiomegaly, otherwise unremarkable.  The patient's EKG shows  normal sinus rhythm at an event rate of 72 beats per minute.  P-R  interval 184.  QRS 96.  Q-T corrected at 442.  Cannot exclude possible  inferior infarct, although this could be due to lead placement.  There  is significant associated baseline artifact and is also not entirely  changed compared to EKG on February 07, 2001.   ASSESSMENT/PLAN:  This is a patient with a history of nonobstructive  coronary artery disease with risk factors who now has developed a  possible hospital-acquired urinary tract infection, as she did have  instrumentation of her bladder during back surgery.  She also has acute  renal failure.  The stress of all of this could certainly lead to  mismatch  and associated subendocardial ischemia, although non-ST  elevation myocardial infarction cannot be ruled out, especially given  her chest pain.  At this time, it is suggested to follow cardiac markers  and keeping her on  metoprolol, adding a heparin drip and continuing aspirin.  She is to  continue the diclofenac and following her kidney function, and  if this  normalizes, she may need evaluation for coronary.  Will get an  echocardiogram in the meantime.  We will follow the patient closely with  you.      Darryl D. Prime, MD  Electronically Signed     DDP/MEDQ  D:  12/27/2008  T:  12/27/2008  Job:  [id number]

## 2010-12-05 NOTE — Cardiovascular Report (Signed)
[place]  Patient:    Valerie Mcclure, Valerie Mcclure                  MRN: [id number] Proc. Date: 02/09/01 Adm. Date:  [id number] Attending:  Linna Darner CC:         Darrick Penna. Linna Darner, M.D. [facility]  Terald Sleeper, M.D.  CV Laboratory   Cardiac Catheterization  INDICATIONS:  The patient is a 69 year old, who presented with chest pain. The current study was done to assess coronary anatomy.  PROCEDURES: 1. Left heart catheterization. 2. Selective coronary arteriography. 3. Selective left ventriculography.  DESCRIPTION OF PROCEDURE:  The procedure was performed from the right femoral artery.  She tolerated it extremely well.  We entered the right femoral artery through an anterior puncture.  Blood pressure was elevated and labetalol 20 mg was given intravenously x3.  She did remain somewhat hypertensive.  She was taken to the holding area in satisfactory clinical condition.  HEMODYNAMICS: 1. Left ventriculography in the RAO projection revealed normal global    systolic function.  No segmental abnormalities contraction were identified.    Ejection fraction was 75.5% calculated in the ______ method in the RAO. 2. The left main coronary artery was free of critical disease. 3. The left anterior descending artery coursed to the apex.  There was one    major septal and one major diagonal.  The LAD has about 20% mild    irregularity in the proximal vessel but no high-grade areas of focal    stenosis distally. 4. The circumflex consisted of a single marginal branch.  It was the best    modest in size.  Very proximally just past a tiny little sub-branch was    approximate 60% eccentric, slightly hypodense appearing stenosis.  Multiple    views were obtained to try to lay this area out as best as possible.  It    did not appear to be high-grade in most views, but again given the location    was somewhat difficult to see. 5. The right coronary artery was a  large dominant vessel providing a    posterior descending and posterolateral system.  There was tapered    narrowing of about 40%.  However, with the diagnostic catheter, this sat    easily in the right coronary ostium without any pressure damping or drop    in pressure suggesting no hemodynamically significant lesion.  This was    measured at 40% luminal reduction with tapering.  CONCLUSIONS: 1. Normal left ventricular function. 2. A 60% eccentric narrowing of the proximal circumflex. 3. A 40% tapered narrowing of the right coronary artery.  DISPOSITION:  Likely the patient will be served with medical therapy.  I will defer further work-up to Dr. Dannielle Burn. DD:  02/09/01 TD:  02/10/01 Job: [id number]

## 2010-12-05 NOTE — Discharge Summary (Signed)
[place]  Patient:    Valerie Mcclure, Valerie Mcclure                  MRN: [id number] Adm. Date:  [id number] Attending:  Linna Darner Dictator:   Arn Medal, P.A.                           Discharge Summary  ADDENDUM:  The patient has an allergy to IV contrast and requires appropriate prophylaxis for future procedures. DD:  02/10/01 TD:  02/10/01 Job: [id number]

## 2010-12-05 NOTE — Discharge Summary (Signed)
Valerie Mcclure. [facility]  Patient:    Valerie Mcclure                  MRN: [id number] Adm. Date:  [id number] Disc. Date: 02/10/01 Attending:  Linna Darner Dictator:   Arn Medal, P.A. CC:         Scharlene Gloss., M.D.   Discharge Summary  DISCHARGE DIAGNOSES: 1. Chest pain, status post cardiac catheterization. 2. Hypertension. 3. Hypothyroidism. 4. Hyperlipidemia. 5. Anxiety. 6. Reflex sympathetic dystrophy of the right hand.  HISTORY OF PRESENT ILLNESS:  Valerie Mcclure is a pleasant 70 year old female who presented to the emergency room on February 07, 2001, complaining of pain in the gastric region, which started at rest the evening prior to admission.  She also reports some left arm, which is also accompanied by some shortness of breath.  She stated that the pain had remained pretty much unchanged until admission.  At the same time, she also reported some presyncopal symptoms. She was seen and admitted by Terald Sleeper, M.D.  HOSPITAL COURSE:  Terald Sleeper, M.D., felt that she had multiple risk factors and her history was very concerning for unstable angina.  He admitted the patient and started her on Lovenox, aspirin, beta blocker, and Pravachol.  His plan was to take her to the catheterization laboratory for cardiac catheterization.  The next day, the patient was again seen by Terald Sleeper, M.D. He was noted that she was rather hypertensive with a pressure of 193/75.  It was also noted that her serial cardiac enzymes were negative for MI.  On the afternoon of February 09, 2001, the patient was taken to the catheterization laboratory by Loretha Brasil. Lia Foyer, M.D.  Catheterization Results:  1. Left anterior descending artery, 20% proximal lesion. 2. Circumflex artery, 60% proximal lesion.  3. Right coronary artery, 40% ostial lesion.  4. Left ventricle, ejection fraction 75.5%, no wall motion abnormalities.  Of note, the patient required three doses  of IV labetalol during the procedure to control her blood pressure.  The next day, the patient was seen by Terald Sleeper, M.D.  She stated that she was doing well and had no further chest pain or gastric discomfort.  As a result, Terald Sleeper, M.D., felt that the patient was stable for discharge.  DISCHARGE MEDICATIONS: 1. Enteric-coated aspirin 81 mg x 2 once daily. 2. Toprol XL 50 mg q.d. 3. Pravachol 10 mg q.p.m. 4. Synthroid 0.05 mg q.d. 5. Neurontin 300 mg b.i.d. 6. Diovan 160 mg q.d. 7. Celexa 20 mg q.d. 8. Estrace as previously taken. 9. Lorazepam as previously taken.  DISCHARGE INSTRUCTIONS: 1. The patient was advised to avoid driving, heavy lifting, or tub baths for    two days. 2. She was instructed to follow a low-fat, low-cholesterol diet. 3. She was advised to watch the catheterization site for any pain, bleeding,    or swelling and to call the [place] office for any of these problems. 4. She was advised to not take Lasix or potassium until seen by Terald Sleeper,    M.D. 5. She is to follow up with Scharlene Gloss., M.D., as needed for    scheduled. 6. She is to follow up with Terald Sleeper, M.D., in the office on March 08, 2001, at 4 p.m.  LABORATORY VALUES:  White count 11.3, hemoglobin 12.8, hematocrit 36.3, platelet count 322.  Sodium 140, potassium 3.4, chloride 105, CO2 24, BUN 12, creatinine 1.4, alkaline phosphatase  48, SGOT 15, SGPT 11, total protein 6.2, albumin 3.3, total bilirubin 0.5.  Of note, the fasting lipid panel was pending at the time of discharge.  The electrocardiogram revealed a normal sinus rhythm at 78 with occasional premature atrial contractions, PR 0.158, QRS 0.092, QTC 0.451, and axis -19. DD:  02/10/01 TD:  02/10/01 Job: [id number]

## 2011-01-07 ENCOUNTER — Other Ambulatory Visit: Payer: Self-pay | Admitting: Internal Medicine

## 2011-03-11 ENCOUNTER — Other Ambulatory Visit: Payer: Self-pay | Admitting: Internal Medicine

## 2011-04-16 ENCOUNTER — Other Ambulatory Visit: Payer: Self-pay | Admitting: Family Medicine

## 2011-04-20 ENCOUNTER — Telehealth: Payer: Self-pay | Admitting: *Deleted

## 2011-04-20 NOTE — Telephone Encounter (Signed)
Left msg on vm wanting to inform md here seeing Valerie Mcclure listening to her heart, and she has an irregular heart beat. Pt states no one has ever told her that before....04/20/11@10 :17am/LMB

## 2011-04-20 NOTE — Telephone Encounter (Signed)
Called pt let her know we received call from NP [facility] pt is needing f/u appt has/nt seen md since 06/2010. Transferred to scheduler to make appt...04/20/11@11 :25am/LMB

## 2011-04-21 ENCOUNTER — Other Ambulatory Visit: Payer: Self-pay | Admitting: Internal Medicine

## 2011-04-21 DIAGNOSIS — Z1231 Encounter for screening mammogram for malignant neoplasm of breast: Secondary | ICD-10-CM

## 2011-04-22 ENCOUNTER — Encounter: Payer: Self-pay | Admitting: Internal Medicine

## 2011-04-22 ENCOUNTER — Other Ambulatory Visit (INDEPENDENT_AMBULATORY_CARE_PROVIDER_SITE_OTHER): Payer: Medicare PPO

## 2011-04-22 ENCOUNTER — Ambulatory Visit (INDEPENDENT_AMBULATORY_CARE_PROVIDER_SITE_OTHER): Payer: Medicare PPO | Admitting: Internal Medicine

## 2011-04-22 VITALS — BP 128/70 | HR 54 | Temp 98.7°F | Ht 64.0 in | Wt 160.1 lb

## 2011-04-22 DIAGNOSIS — E785 Hyperlipidemia, unspecified: Secondary | ICD-10-CM

## 2011-04-22 DIAGNOSIS — Z79899 Other long term (current) drug therapy: Secondary | ICD-10-CM

## 2011-04-22 DIAGNOSIS — I499 Cardiac arrhythmia, unspecified: Secondary | ICD-10-CM

## 2011-04-22 DIAGNOSIS — E039 Hypothyroidism, unspecified: Secondary | ICD-10-CM

## 2011-04-22 DIAGNOSIS — Z23 Encounter for immunization: Secondary | ICD-10-CM

## 2011-04-22 LAB — LIPID PANEL
HDL: 48.4 mg/dL (ref >39.00)
LDL Cholesterol: 98 mg/dL (ref 0–99)
Total CHOL/HDL Ratio: 4
Triglycerides: 160 mg/dL — ABNORMAL HIGH (ref 0.0–149.0)

## 2011-04-22 LAB — HEPATIC FUNCTION PANEL: Albumin: 3.8 g/dL (ref 3.5–5.2)

## 2011-04-22 MED ORDER — ASPIRIN EC 81 MG PO TBEC: 81.0000 mg | DELAYED_RELEASE_TABLET | Freq: Every day | ORAL | Status: AC

## 2011-04-22 NOTE — Assessment & Plan Note (Signed)
Dose increase 11/2010 -  recheck TSH now and adjust as needed Lab Results  Component Value Date   TSH 5.66* 11/18/2010

## 2011-04-22 NOTE — Progress Notes (Signed)
  Subjective:    Patient ID: Valerie Mcclure, female    DOB: March 18, 1941, 70 y.o.   MRN: [id number]  HPI  concern for ? irreg heart beat - told same by [facility] health screening nurse in past week No palpitations, shortness of breath or "skipped" beats feeling  Also reviewed chronic medical issues:  reviewed chronic med issues as well-   hypothyroid - reports compliance with ongoing medical treatment and no changes in medication dose or frequency. denies adverse side effects related to current therapy.   HTN - reports compliance with ongoing medical treatment and no changes in medication dose or frequency. denies adverse side effects related to current therapy.   Dyslipidemia - on statin since 11/2010 - the patient reports compliance with medication(s) as prescribed. Denies adverse side effects.  Past Medical History  Diagnosis Date  . Unspecified hypothyroidism   . Chronic low back pain   . Unspecified essential hypertension   . Hyperlipidemia   . Osteopenia     Review of Systems  Constitutional: Negative for fever and unexpected weight change.  Respiratory: Negative for cough and shortness of breath.   Cardiovascular: Negative for chest pain and leg swelling.       Objective:   Physical Exam BP 128/70  Pulse 54  Temp(Src) 98.7 F (37.1 C) (Oral)  Ht 5\' 4"  (1.626 m)  Wt 160 lb 1.9 oz (72.63 kg)  BMI 27.48 kg/m2  SpO2 99% Wt Readings from Last 3 Encounters:  04/22/11 160 lb 1.9 oz (72.63 kg)  11/18/10 172 lb (78.019 kg)  06/24/10 172 lb (78.019 kg)   Constitutional: She is well-developed and well-nourished. No distress.  Neck: Normal range of motion. Neck supple. No JVD present. No thyromegaly present.  Cardiovascular: slightly brady with seeming regular rhythm and ?PVC (rather than irreg irreg); normal heart sounds.  No murmur heard. No BLE edema. Pulmonary/Chest: Effort normal and breath sounds normal. No respiratory distress. She has no wheezes.  Skin: Skin is  warm and dry. No rash noted. No erythema.  Psychiatric: She has a normal mood and affect. Her behavior is normal. Judgment and thought content normal.   Lab Results  Component Value Date   WBC 5.2 11/18/2010   HGB 12.5 11/18/2010   HCT 36.6 11/18/2010   PLT 226.0 11/18/2010   CHOL 222* 11/18/2010   TRIG 180.0* 11/18/2010   HDL 47.70 11/18/2010   LDLDIRECT 133.2 11/18/2010   ALT 15 01/09/2010   AST 19 01/09/2010   NA 138 11/18/2010   K 4.1 11/18/2010   CL 98 11/18/2010   CREATININE 1.0 11/18/2010   BUN 17 11/18/2010   CO2 31 11/18/2010   TSH 5.66* 11/18/2010   INR 1.2 12/26/2008   EKG 11/18/10: sinus@54  with PVCs EKG today: brady sinus @ 53 - no ST-T change     Assessment & Plan:   Arrythmia, mild bradycardia - EKG now (as 11/18/10) shows sinus brady (prev with PVCs) - will add ASA, no other eval needed at this time - (even IF PAF present, CHADS2 only 1)  Also See problem list. Medications and labs reviewed today.

## 2011-04-22 NOTE — Assessment & Plan Note (Addendum)
started rx treatment for same 11/2010 recheck now with LFTs Commended on weight loss efforts

## 2011-04-22 NOTE — Patient Instructions (Signed)
It was good to see you today. EKG today does not show irregular ryhtym - suspect "PVCs", not atrial fibrillation Ok to start aspirin 81 mg daily but no need for other blood thinners -  Test(s) ordered today. Your results will be called to you after review (48-72hours after test completion). If any changes need to be made, you will be notified at that time. Please schedule followup in 6 months to monitor thyroid and cholesterol and blood pressure, call sooner if problems. Great job on the weight loss! Keep up the good work!!

## 2011-05-29 ENCOUNTER — Ambulatory Visit: Payer: Medicare PPO

## 2011-08-18 ENCOUNTER — Ambulatory Visit: Payer: Medicare PPO

## 2011-08-21 ENCOUNTER — Ambulatory Visit
Admission: RE | Admit: 2011-08-21 | Discharge: 2011-08-21 | Disposition: A | Discharge status: Home / Self Care | Payer: Medicare PPO | Source: Ambulatory Visit | Attending: Internal Medicine | Admitting: Internal Medicine

## 2011-08-21 DIAGNOSIS — Z1231 Encounter for screening mammogram for malignant neoplasm of breast: Secondary | ICD-10-CM

## 2011-09-29 ENCOUNTER — Other Ambulatory Visit: Payer: Self-pay | Admitting: Internal Medicine

## 2011-10-29 ENCOUNTER — Other Ambulatory Visit: Payer: Self-pay | Admitting: Internal Medicine

## 2011-11-02 ENCOUNTER — Other Ambulatory Visit (INDEPENDENT_AMBULATORY_CARE_PROVIDER_SITE_OTHER): Payer: Medicare PPO

## 2011-11-02 ENCOUNTER — Ambulatory Visit (INDEPENDENT_AMBULATORY_CARE_PROVIDER_SITE_OTHER): Payer: Medicare PPO | Admitting: Internal Medicine

## 2011-11-02 ENCOUNTER — Encounter: Payer: Self-pay | Admitting: Internal Medicine

## 2011-11-02 VITALS — BP 132/70 | HR 62 | Temp 97.7°F | Ht 62.0 in | Wt 170.4 lb

## 2011-11-02 DIAGNOSIS — E785 Hyperlipidemia, unspecified: Secondary | ICD-10-CM

## 2011-11-02 DIAGNOSIS — I10 Essential (primary) hypertension: Secondary | ICD-10-CM

## 2011-11-02 DIAGNOSIS — E039 Hypothyroidism, unspecified: Secondary | ICD-10-CM

## 2011-11-02 LAB — TSH: TSH: 2.56 u[IU]/mL (ref 0.35–5.50)

## 2011-11-02 MED ORDER — TANDEM PLUS 162-115.2-1 MG PO CAPS: ORAL_CAPSULE | ORAL | Status: DC

## 2011-11-02 MED ORDER — VALSARTAN-HYDROCHLOROTHIAZIDE 160-25 MG PO TABS: 1.0000 | ORAL_TABLET | Freq: Every day | ORAL | Status: DC

## 2011-11-02 MED ORDER — ESTRADIOL 2 MG PO TABS: 2.0000 mg | ORAL_TABLET | Freq: Every day | ORAL | Status: DC

## 2011-11-02 MED ORDER — FUROSEMIDE 20 MG PO TABS: 20.0000 mg | ORAL_TABLET | Freq: Every day | ORAL | Status: DC

## 2011-11-02 NOTE — Progress Notes (Signed)
  Subjective:    Patient ID: Valerie Mcclure, female    DOB: 08-04-1940, 71 y.o.   MRN: [id number]  HPI here for follow up - reviewed chronic medical issues:  hypothyroid - reports compliance with ongoing medical treatment and no changes in medication dose or frequency. denies adverse side effects related to current therapy.   HTN - reports compliance with ongoing medical treatment and no changes in medication dose or frequency. denies adverse side effects related to current therapy.   Dyslipidemia - on statin since 11/2010 - the patient reports compliance with medication(s) as prescribed. Denies adverse side effects.  Chronic LBP- follows with Dr Hardin Negus for same - no change in pain symptoms or pain mgmt meds  Past Medical History  Diagnosis Date  . Unspecified hypothyroidism   . Chronic low back pain   . Unspecified essential hypertension   . Hyperlipidemia   . Osteopenia     Review of Systems  Constitutional: Negative for fever and unexpected weight change.  Respiratory: Negative for cough and shortness of breath.   Cardiovascular: Negative for chest pain and leg swelling.       Objective:   Physical Exam  BP 132/70  Pulse 62  Temp(Src) 97.7 F (36.5 C) (Oral)  Ht 5\' 2"  (1.575 m)  Wt 170 lb 6.4 oz (77.293 kg)  BMI 31.17 kg/m2  SpO2 98% Wt Readings from Last 3 Encounters:  11/02/11 170 lb 6.4 oz (77.293 kg)  04/22/11 160 lb 1.9 oz (72.63 kg)  11/18/10 172 lb (78.019 kg)   Constitutional: She is well-developed and well-nourished. No distress.  Neck: Normal range of motion. Neck supple. No JVD present. No thyromegaly present.  Cardiovascular:  Regular rate and rhythm. normal heart sounds.  No murmur heard. No BLE edema. Pulmonary/Chest: Effort normal and breath sounds normal. No respiratory distress. She has no wheezes.  Skin: Skin is warm and dry. No rash noted. No erythema.  Psychiatric: She has a normal mood and affect. Her behavior is normal. Judgment and  thought content normal.   Lab Results  Component Value Date   WBC 5.2 11/18/2010   HGB 12.5 11/18/2010   HCT 36.6 11/18/2010   PLT 226.0 11/18/2010   CHOL 178 04/22/2011   TRIG 160.0* 04/22/2011   HDL 48.40 04/22/2011   LDLDIRECT 133.2 11/18/2010   ALT 14 04/22/2011   AST 21 04/22/2011   NA 138 11/18/2010   K 4.1 11/18/2010   CL 98 11/18/2010   CREATININE 1.0 11/18/2010   BUN 17 11/18/2010   CO2 31 11/18/2010   TSH 1.93 04/22/2011   INR 1.2 12/26/2008        Assessment & Plan:   see problem list. Medications and labs reviewed today.  Situational stress - support offered

## 2011-11-02 NOTE — Assessment & Plan Note (Signed)
conitnue prn daily dose furosemide for chronic dep edema (started 5/12) The current medical regimen is effective;  continue present plan and medications.  BP Readings from Last 3 Encounters:  11/02/11 132/70  04/22/11 128/70  11/18/10 112/82

## 2011-11-02 NOTE — Assessment & Plan Note (Signed)
Dose increase 11/2010 -  recheck TSH now and adjust as needed Lab Results  Component Value Date   TSH 1.93 04/22/2011

## 2011-11-02 NOTE — Assessment & Plan Note (Signed)
started rx treatment for same 11/2010 recommended to resume prior weight loss efforts The current medical regimen is effective;  continue present plan and medications.

## 2011-11-02 NOTE — Patient Instructions (Signed)
It was good to see you today. Test(s) ordered today. Your results will be called to you after review (48-72hours after test completion). If any changes need to be made, you will be notified at that time. Medications reviewed, no changes at this time. Refill on medication(s) as discussed today. Please followup in 6 months to monitor thyroid and cholesterol, blood pressure, call sooner if problems. Work on lifestyle changes as discussed (low fat, low carb, increased protein diet; improved exercise efforts; weight loss) to control sugar, blood pressure and cholesterol levels and/or reduce risk of developing other medical problems. Look into [email or web address] or other type of food journal to assist you in this process.

## 2011-11-26 ENCOUNTER — Other Ambulatory Visit: Payer: Self-pay | Admitting: Internal Medicine

## 2011-12-31 ENCOUNTER — Other Ambulatory Visit: Payer: Self-pay | Admitting: Internal Medicine

## 2012-02-11 ENCOUNTER — Other Ambulatory Visit: Payer: Self-pay | Admitting: *Deleted

## 2012-02-11 MED ORDER — ESTRADIOL 2 MG PO TABS: 2.0000 mg | ORAL_TABLET | Freq: Every day | ORAL | Status: DC

## 2012-06-09 ENCOUNTER — Other Ambulatory Visit: Payer: Self-pay | Admitting: Internal Medicine

## 2012-07-12 ENCOUNTER — Other Ambulatory Visit: Payer: Self-pay | Admitting: Internal Medicine

## 2012-07-15 ENCOUNTER — Other Ambulatory Visit: Payer: Self-pay | Admitting: Internal Medicine

## 2012-08-03 ENCOUNTER — Other Ambulatory Visit (HOSPITAL_COMMUNITY): Payer: Self-pay | Admitting: Anesthesiology

## 2012-08-03 ENCOUNTER — Ambulatory Visit (HOSPITAL_COMMUNITY)
Admission: RE | Admit: 2012-08-03 | Discharge: 2012-08-03 | Disposition: A | Discharge status: Home / Self Care | Payer: Medicare PPO | Source: Ambulatory Visit | Attending: Anesthesiology | Admitting: Anesthesiology

## 2012-08-03 DIAGNOSIS — M1710 Unilateral primary osteoarthritis, unspecified knee: Secondary | ICD-10-CM | POA: Insufficient documentation

## 2012-08-03 DIAGNOSIS — R52 Pain, unspecified: Secondary | ICD-10-CM

## 2012-08-03 DIAGNOSIS — IMO0002 Reserved for concepts with insufficient information to code with codable children: Secondary | ICD-10-CM | POA: Insufficient documentation

## 2012-08-03 DIAGNOSIS — M25569 Pain in unspecified knee: Secondary | ICD-10-CM | POA: Insufficient documentation

## 2012-08-03 DIAGNOSIS — R609 Edema, unspecified: Secondary | ICD-10-CM | POA: Insufficient documentation

## 2012-09-23 ENCOUNTER — Encounter: Payer: Self-pay | Admitting: Internal Medicine

## 2012-09-30 ENCOUNTER — Encounter: Payer: Self-pay | Admitting: Internal Medicine

## 2012-10-13 ENCOUNTER — Other Ambulatory Visit: Payer: Self-pay | Admitting: Internal Medicine

## 2012-11-14 ENCOUNTER — Ambulatory Visit (INDEPENDENT_AMBULATORY_CARE_PROVIDER_SITE_OTHER): Payer: Medicare PPO | Admitting: Internal Medicine

## 2012-11-14 ENCOUNTER — Other Ambulatory Visit (INDEPENDENT_AMBULATORY_CARE_PROVIDER_SITE_OTHER): Payer: Medicare PPO

## 2012-11-14 ENCOUNTER — Encounter: Payer: Self-pay | Admitting: Internal Medicine

## 2012-11-14 VITALS — BP 112/72 | HR 69 | Temp 98.1°F | Wt 162.8 lb

## 2012-11-14 DIAGNOSIS — R5383 Other fatigue: Secondary | ICD-10-CM

## 2012-11-14 DIAGNOSIS — H43399 Other vitreous opacities, unspecified eye: Secondary | ICD-10-CM

## 2012-11-14 DIAGNOSIS — Z0000 Encounter for general adult medical examination without abnormal findings: Secondary | ICD-10-CM

## 2012-11-14 DIAGNOSIS — I10 Essential (primary) hypertension: Secondary | ICD-10-CM

## 2012-11-14 DIAGNOSIS — M899 Disorder of bone, unspecified: Secondary | ICD-10-CM

## 2012-11-14 DIAGNOSIS — E039 Hypothyroidism, unspecified: Secondary | ICD-10-CM

## 2012-11-14 DIAGNOSIS — R5381 Other malaise: Secondary | ICD-10-CM

## 2012-11-14 DIAGNOSIS — Z1211 Encounter for screening for malignant neoplasm of colon: Secondary | ICD-10-CM

## 2012-11-14 DIAGNOSIS — H43391 Other vitreous opacities, right eye: Secondary | ICD-10-CM

## 2012-11-14 DIAGNOSIS — E785 Hyperlipidemia, unspecified: Secondary | ICD-10-CM

## 2012-11-14 LAB — CBC WITH DIFFERENTIAL/PLATELET
Basophils Relative: 0.3 % (ref 0.0–3.0)
Eosinophils Relative: 1.8 % (ref 0.0–5.0)
HCT: 37.8 % (ref 36.0–46.0)
Hemoglobin: 13.1 g/dL (ref 12.0–15.0)
Lymphs Abs: 1.9 10*3/uL (ref 0.7–4.0)
MCV: 90.4 fl (ref 78.0–100.0)
Monocytes Absolute: 0.4 10*3/uL (ref 0.1–1.0)
Neutro Abs: 4 10*3/uL (ref 1.4–7.7)
RBC: 4.19 Mil/uL (ref 3.87–5.11)
WBC: 6.5 10*3/uL (ref 4.5–10.5)

## 2012-11-14 LAB — HEPATIC FUNCTION PANEL
ALT: 16 U/L (ref 0–35)
AST: 19 U/L (ref 0–37)
Albumin: 3.8 g/dL (ref 3.5–5.2)
Alkaline Phosphatase: 52 U/L (ref 39–117)
Bilirubin, Direct: 0.1 mg/dL (ref 0.0–0.3)
Total Bilirubin: 0.4 mg/dL (ref 0.3–1.2)
Total Protein: 7.4 g/dL (ref 6.0–8.3)

## 2012-11-14 LAB — LIPID PANEL
Cholesterol: 150 mg/dL (ref 0–200)
HDL: 47 mg/dL
LDL Cholesterol: 68 mg/dL (ref 0–99)
Total CHOL/HDL Ratio: 3
Triglycerides: 175 mg/dL — ABNORMAL HIGH (ref 0.0–149.0)
VLDL: 35 mg/dL (ref 0.0–40.0)

## 2012-11-14 LAB — BASIC METABOLIC PANEL
Chloride: 98 mEq/L (ref 96–112)
Creatinine, Ser: 1.2 mg/dL (ref 0.4–1.2)

## 2012-11-14 LAB — TSH: TSH: 3.54 u[IU]/mL (ref 0.35–5.50)

## 2012-11-14 MED ORDER — ESTRADIOL 1 MG PO TABS: 1.0000 mg | ORAL_TABLET | Freq: Every day | ORAL | Status: DC

## 2012-11-14 NOTE — Progress Notes (Signed)
Subjective:    Patient ID: Valerie Mcclure, female    DOB: August 11, 1940, 73 y.o.   MRN: [id number]  HPI  Here for annual wellness  Diet: heart healthy  Physical activity: sedentary Depression/mood screen: negative Hearing: intact to whispered voice Visual acuity: grossly normal, "floater" in R eye x months  ADLs: capable Fall risk: none Home safety: good Cognitive evaluation: intact to orientation, naming, recall and repetition EOL planning: adv directives, full code/ I agree  I have personally reviewed and have noted 1. The patient's medical and social history 2. Their use of alcohol, tobacco or illicit drugs 3. Their current medications and supplements 4. The patient's functional ability including ADL's, fall risks, home safety risks and hearing or visual impairment. 5. Diet and physical activities 6. Evidence for depression or mood disorders  Also reviewed chronic medical issues:  hypothyroid - reports compliance with ongoing medical treatment and no changes in medication dose or frequency. denies adverse side effects related to current therapy.   hypertension - reports compliance with ongoing medical treatment and no changes in medication dose or frequency. denies adverse side effects related to current therapy.   Dyslipidemia - on statin since 11/2010 - the patient reports compliance with medication(s) as prescribed. Denies adverse side effects.  Chronic LBP- follows with Dr Hardin Negus for same - no change in pain symptoms or pain mgmt meds  Past Medical History  Diagnosis Date  . Unspecified hypothyroidism   . Chronic low back pain   . Unspecified essential hypertension   . Hyperlipidemia   . Osteopenia    Family History  Problem Relation Age of Onset  . Heart disease Father   . Cancer Neg Hx    History  Substance Use Topics  . Smoking status: Never Smoker   . Smokeless tobacco: Not on file  . Alcohol Use: No    Review of Systems  Constitutional:  Positive for fatigue. Negative for fever and unexpected weight change.  Eyes: Negative for photophobia, pain, discharge and itching. Visual disturbance: floater in R eye x months.  Respiratory: Negative for cough and shortness of breath.   Cardiovascular: Negative for chest pain and leg swelling.  Gastrointestinal: Negative for abdominal pain, no bowel changes.  Neurological: Negative for dizziness or headache.  No other specific complaints in a complete review of systems (except as listed in HPI above).      Objective:   Physical Exam  BP 112/72  Pulse 69  Temp(Src) 98.1 F (36.7 C) (Oral)  Wt 162 lb 12.8 oz (73.846 kg)  BMI 29.77 kg/m2  SpO2 99% Wt Readings from Last 3 Encounters:  11/14/12 162 lb 12.8 oz (73.846 kg)  11/02/11 170 lb 6.4 oz (77.293 kg)  04/22/11 160 lb 1.9 oz (72.63 kg)   Constitutional: She is overweight, but appears well-developed and well-nourished. No distress.  HENT: Head: Normocephalic and atraumatic. Ears: B TMs ok, no erythema or effusion; Nose: Nose normal. Mouth/Throat: Oropharynx is clear and moist. No oropharyngeal exudate.  Eyes: Conjunctivae and EOM are normal. Pupils are equal, round, and reactive to light. No scleral icterus.  Neck: Normal range of motion. Neck supple. No LAD or JVD present. s/p thyroidectomy, scar well healed.  Cardiovascular: Normal rate, regular rhythm and normal heart sounds.  No murmur heard. trace ankle BLE edema. Pulmonary/Chest: Effort normal and breath sounds normal. No respiratory distress. She has no wheezes.  Abdominal: Soft. Bowel sounds are normal. She exhibits no distension. There is no tenderness. no masses Musculoskeletal: B knee -  boggy synovitis - tender to palpation over joint line; FROM and ligamentous function intact -Normal range of motion, no joint effusions. No gross deformities Neurological: She is alert and oriented to person, place, and time. No cranial nerve deficit. Coordination, balance, strength,  speech and gait are normal.  Skin: Skin is warm and dry. No rash noted. No erythema.  Psychiatric: She has a normal mood and affect. Her behavior is normal. Judgment and thought content normal.    Lab Results  Component Value Date   WBC 5.2 11/18/2010   HGB 12.5 11/18/2010   HCT 36.6 11/18/2010   PLT 226.0 11/18/2010   CHOL 178 04/22/2011   TRIG 160.0* 04/22/2011   HDL 48.40 04/22/2011   LDLDIRECT 133.2 11/18/2010   ALT 14 04/22/2011   AST 21 04/22/2011   NA 138 11/18/2010   K 4.1 11/18/2010   CL 98 11/18/2010   CREATININE 1.0 11/18/2010   BUN 17 11/18/2010   CO2 31 11/18/2010   TSH 2.56 11/02/2011   INR 1.2 12/26/2008        Assessment & Plan:   AWV/CPX/v70.0 - Today patient counseled on age appropriate routine health concerns for screening and prevention, each reviewed and up to date or declined. Immunizations reviewed and up to date or declined. Labs/ECG reviewed. Risk factors for depression reviewed and negative. Hearing function and visual acuity are intact. ADLs screened and addressed as needed. Functional ability and level of safety reviewed and appropriate. Education, counseling and referrals performed based on assessed risks today. Patient provided with a copy of personalized plan for preventive services.  R eye "floater" - refer to optho for eval of same  Fatigue - nonspecific symptoms/exam - check screening labs  Also see problem list. Medications and labs reviewed today.

## 2012-11-14 NOTE — Patient Instructions (Signed)
It was good to see you today. We have reviewed your prior records including labs and tests today Test(s) ordered today. Your results will be released to [facility] (or called to you) after review, usually within 72hours after test completion. If any changes need to be made, you will be notified at that same time. Medications reviewed and updated, reduce estrogen dose by 1/2: take 1 mg daily;  no other changes recommended at this time. we'll make referral to ophthalmology for evaluation of your eye symptoms. Our office will contact you regarding appointment(s) once made. Will schedule bone density scan to look at risk for osteoporosis -you'll be contacted with these results after review Will check regarding colonoscopy screening -you'll be contacted regarding need for this test if felt to be appropriate Please schedule followup in 12 months for annual wellness and labs, call sooner if problems.  Health Maintenance, Females A healthy lifestyle and preventative care can promote health and wellness.  Maintain regular health, dental, and eye exams.  Eat a healthy diet. Foods like vegetables, fruits, whole grains, low-fat dairy products, and lean protein foods contain the nutrients you need without too many calories. Decrease your intake of foods high in solid fats, added sugars, and salt. Get information about a proper diet from your caregiver, if necessary.  Regular physical exercise is one of the most important things you can do for your health. Most adults should get at least 150 minutes of moderate-intensity exercise (any activity that increases your heart rate and causes you to sweat) each week. In addition, most adults need muscle-strengthening exercises on 2 or more days a week.   Maintain a healthy weight. The body mass index (BMI) is a screening tool to identify possible weight problems. It provides an estimate of body fat based on height and weight. Your caregiver can help determine your BMI,  and can help you achieve or maintain a healthy weight. For adults 20 years and older:  A BMI below 18.5 is considered underweight.  A BMI of 18.5 to 24.9 is normal.  A BMI of 25 to 29.9 is considered overweight.  A BMI of 30 and above is considered obese.  Maintain normal blood lipids and cholesterol by exercising and minimizing your intake of saturated fat. Eat a balanced diet with plenty of fruits and vegetables. Blood tests for lipids and cholesterol should begin at age 37 and be repeated every 5 years. If your lipid or cholesterol levels are high, you are over 50, or you are a high risk for heart disease, you may need your cholesterol levels checked more frequently.Ongoing high lipid and cholesterol levels should be treated with medicines if diet and exercise are not effective.  If you smoke, find out from your caregiver how to quit. If you do not use tobacco, do not start.  If you are pregnant, do not drink alcohol. If you are breastfeeding, be very cautious about drinking alcohol. If you are not pregnant and choose to drink alcohol, do not exceed 1 drink per day. One drink is considered to be 12 ounces (355 mL) of beer, 5 ounces (148 mL) of wine, or 1.5 ounces (44 mL) of liquor.  Avoid use of street drugs. Do not share needles with anyone. Ask for help if you need support or instructions about stopping the use of drugs.  High blood pressure causes heart disease and increases the risk of stroke. Blood pressure should be checked at least every 1 to 2 years. Ongoing high blood pressure should  be treated with medicines, if weight loss and exercise are not effective.  If you are 82 to 73 years old, ask your caregiver if you should take aspirin to prevent strokes.  Diabetes screening involves taking a blood sample to check your fasting blood sugar level. This should be done once every 3 years, after age 79, if you are within normal weight and without risk factors for diabetes. Testing should  be considered at a younger age or be carried out more frequently if you are overweight and have at least 1 risk factor for diabetes.  Breast cancer screening is essential preventative care for women. You should practice "breast self-awareness." This means understanding the normal appearance and feel of your breasts and may include breast self-examination. Any changes detected, no matter how small, should be reported to a caregiver. Women in their 73s and 30s should have a clinical breast exam (CBE) by a caregiver as part of a regular health exam every 1 to 3 years. After age 28, women should have a CBE every year. Starting at age 35, women should consider having a mammogram (breast X-ray) every year. Women who have a family history of breast cancer should talk to their caregiver about genetic screening. Women at a high risk of breast cancer should talk to their caregiver about having an MRI and a mammogram every year.  The Pap test is a screening test for cervical cancer. Women should have a Pap test starting at age 59. Between ages 32 and 63, Pap tests should be repeated every 2 years. Beginning at age 7, you should have a Pap test every 3 years as long as the past 3 Pap tests have been normal. If you had a hysterectomy for a problem that was not cancer or a condition that could lead to cancer, then you no longer need Pap tests. If you are between ages 32 and 56, and you have had normal Pap tests going back 10 years, you no longer need Pap tests. If you have had past treatment for cervical cancer or a condition that could lead to cancer, you need Pap tests and screening for cancer for at least 20 years after your treatment. If Pap tests have been discontinued, risk factors (such as a new sexual partner) need to be reassessed to determine if screening should be resumed. Some women have medical problems that increase the chance of getting cervical cancer. In these cases, your caregiver may recommend more  frequent screening and Pap tests.  The human papillomavirus (HPV) test is an additional test that may be used for cervical cancer screening. The HPV test looks for the virus that can cause the cell changes on the cervix. The cells collected during the Pap test can be tested for HPV. The HPV test could be used to screen women aged 43 years and older, and should be used in women of any age who have unclear Pap test results. After the age of 21, women should have HPV testing at the same frequency as a Pap test.  Colorectal cancer can be detected and often prevented. Most routine colorectal cancer screening begins at the age of 50 and continues through age 62. However, your caregiver may recommend screening at an earlier age if you have risk factors for colon cancer. On a yearly basis, your caregiver may provide home test kits to check for hidden blood in the stool. Use of a small camera at the end of a tube, to directly examine the colon (sigmoidoscopy  or colonoscopy), can detect the earliest forms of colorectal cancer. Talk to your caregiver about this at age 63, when routine screening begins. Direct examination of the colon should be repeated every 5 to 10 years through age 53, unless early forms of pre-cancerous polyps or small growths are found.  Hepatitis C blood testing is recommended for all people born from [birth year] through 1966 and any individual with known risks for hepatitis C.  Practice safe sex. Use condoms and avoid high-risk sexual practices to reduce the spread of sexually transmitted infections (STIs). Sexually active women aged 66 and younger should be checked for Chlamydia, which is a common sexually transmitted infection. Older women with new or multiple partners should also be tested for Chlamydia. Testing for other STIs is recommended if you are sexually active and at increased risk.  Osteoporosis is a disease in which the bones lose minerals and strength with aging. This can result in  serious bone fractures. The risk of osteoporosis can be identified using a bone density scan. Women ages 23 and over and women at risk for fractures or osteoporosis should discuss screening with their caregivers. Ask your caregiver whether you should be taking a calcium supplement or vitamin D to reduce the rate of osteoporosis.  Menopause can be associated with physical symptoms and risks. Hormone replacement therapy is available to decrease symptoms and risks. You should talk to your caregiver about whether hormone replacement therapy is right for you.  Use sunscreen with a sun protection factor (SPF) of 30 or greater. Apply sunscreen liberally and repeatedly throughout the day. You should seek shade when your shadow is shorter than you. Protect yourself by wearing long sleeves, pants, a wide-brimmed hat, and sunglasses year round, whenever you are outdoors.  Notify your caregiver of new moles or changes in moles, especially if there is a change in shape or color. Also notify your caregiver if a mole is larger than the size of a pencil eraser.  Stay current with your immunizations. Document Released: 01/19/2011 Document Revised: 09/28/2011 Document Reviewed: 01/19/2011 [facility] Patient Information 2013 [place].

## 2012-11-14 NOTE — Assessment & Plan Note (Signed)
started rx treatment for same 11/2010 Check lipids annually - last reviewed The current medical regimen is effective;  continue present plan and medications.

## 2012-11-14 NOTE — Assessment & Plan Note (Signed)
Last DEXA > 2 yers ago - chronic back issues Check DEXA now

## 2012-11-14 NOTE — Assessment & Plan Note (Signed)
Dose increase 11/2010 -  recheck TSH now and adjust as needed Lab Results  Component Value Date   TSH 2.56 11/02/2011

## 2012-11-14 NOTE — Assessment & Plan Note (Signed)
conitnue prn daily dose furosemide for chronic dep edema (started 5/12) The current medical regimen is effective;  continue present plan and medications.  BP Readings from Last 3 Encounters:  11/14/12 112/72  11/02/11 132/70  04/22/11 128/70

## 2012-11-15 ENCOUNTER — Telehealth: Payer: Self-pay | Admitting: *Deleted

## 2012-11-15 NOTE — Telephone Encounter (Signed)
Received fax pt needing PA on estradiol 1 mg. Contacted insurance they states pt must have tried & fail formulary drug before non formulary would be approve. Formulary are premarin, vivelle dot, [place], estro gel. Requesting md to choose between those 4...lmb

## 2012-11-16 MED ORDER — ESTRADIOL 0.025 MG/24HR TD PTTW: 1.0000 | MEDICATED_PATCH | TRANSDERMAL | Status: DC

## 2012-11-16 NOTE — Telephone Encounter (Signed)
Noted - rx vivelle dot - apply patch 2x/week - erx done Please let pt know same -  estradiol pill removed from EMR med list thanks

## 2012-11-17 NOTE — Telephone Encounter (Signed)
Faxed PA for estradiol back to insurance waiting on approval status...Valerie Mcclure

## 2012-11-17 NOTE — Telephone Encounter (Signed)
Received PA fro the vivelle-dot as well. Pt would like to continue the estradiol instead. Notified insurance faxing over PA & have received placing on md desk for completion for estradiol...Johny Chess

## 2012-11-17 NOTE — Telephone Encounter (Signed)
Received PA back med was approved. Notified pharmacy with approval status,....Johny Chess

## 2013-01-04 ENCOUNTER — Other Ambulatory Visit: Payer: Self-pay | Admitting: *Deleted

## 2013-01-04 MED ORDER — METOPROLOL TARTRATE 25 MG PO TABS: ORAL_TABLET | ORAL | Status: DC

## 2013-01-04 MED ORDER — FUROSEMIDE 20 MG PO TABS: ORAL_TABLET | ORAL | Status: DC

## 2013-01-04 MED ORDER — SIMVASTATIN 10 MG PO TABS: ORAL_TABLET | ORAL | Status: DC

## 2013-01-04 MED ORDER — LEVOTHYROXINE SODIUM 50 MCG PO TABS: ORAL_TABLET | ORAL | Status: DC

## 2013-01-04 NOTE — Telephone Encounter (Signed)
Rcd fax from [facility] for refills of Furosemide, Levothyroxine, Metoprolol and Simvastatin.

## 2013-01-09 ENCOUNTER — Other Ambulatory Visit: Payer: Self-pay | Admitting: *Deleted

## 2013-01-09 MED ORDER — VALSARTAN-HYDROCHLOROTHIAZIDE 160-25 MG PO TABS: 1.0000 | ORAL_TABLET | Freq: Every day | ORAL | Status: DC

## 2013-01-09 MED ORDER — ESTRADIOL 0.025 MG/24HR TD PTTW: 1.0000 | MEDICATED_PATCH | TRANSDERMAL | Status: DC

## 2013-01-11 ENCOUNTER — Other Ambulatory Visit: Payer: Self-pay | Admitting: Internal Medicine

## 2013-01-31 ENCOUNTER — Encounter: Payer: Self-pay | Admitting: Internal Medicine

## 2013-03-29 ENCOUNTER — Ambulatory Visit (AMBULATORY_SURGERY_CENTER): Payer: Self-pay | Admitting: *Deleted

## 2013-03-29 VITALS — Ht 64.0 in | Wt 164.4 lb

## 2013-03-29 DIAGNOSIS — Z1211 Encounter for screening for malignant neoplasm of colon: Secondary | ICD-10-CM

## 2013-03-29 MED ORDER — NA SULFATE-K SULFATE-MG SULF 17.5-3.13-1.6 GM/177ML PO SOLN: 1.0000 | Freq: Once | ORAL | Status: DC

## 2013-03-29 NOTE — Progress Notes (Signed)
No egg or soy allergy. ewm No cpap or home 02 use. ewm No problems with past sedation. ewm Pt had flex sig 2010 in hosp and last colon 2003 with dR [place]. ewm Pt doesn't have e mail for emmi video. ewm

## 2013-04-04 ENCOUNTER — Encounter: Payer: Self-pay | Admitting: Internal Medicine

## 2013-04-13 ENCOUNTER — Encounter: Payer: Self-pay | Admitting: Internal Medicine

## 2013-04-13 ENCOUNTER — Ambulatory Visit (AMBULATORY_SURGERY_CENTER): Payer: Medicare PPO | Admitting: Internal Medicine

## 2013-04-13 VITALS — BP 130/57 | HR 54 | Temp 98.2°F | Resp 13

## 2013-04-13 DIAGNOSIS — K648 Other hemorrhoids: Secondary | ICD-10-CM

## 2013-04-13 DIAGNOSIS — Z1211 Encounter for screening for malignant neoplasm of colon: Secondary | ICD-10-CM

## 2013-04-13 DIAGNOSIS — K5730 Diverticulosis of large intestine without perforation or abscess without bleeding: Secondary | ICD-10-CM

## 2013-04-13 DIAGNOSIS — D126 Benign neoplasm of colon, unspecified: Secondary | ICD-10-CM

## 2013-04-13 MED ORDER — SODIUM CHLORIDE 0.9 % IV SOLN: 500.0000 mL | INTRAVENOUS | Status: DC

## 2013-04-13 NOTE — Patient Instructions (Addendum)
I found and removed one tiny polyp today. You also have diverticulosis and hemorrhoids.  I do not think you will need another routine colonoscopy.  If you have hemorrhoid problems (swelling, itching, bleeding) I am able to treat those with an in-office procedure. If you like, please call my office at [phone] to schedule an appointment and I can evaluate you further.   I appreciate the opportunity to care for you. Gatha Mayer, MD, FACG    YOU HAD AN ENDOSCOPIC PROCEDURE TODAY AT [facility]: Refer to the procedure report that was given to you for any specific questions about what was found during the examination.  If the procedure report does not answer your questions, please call your gastroenterologist to clarify.  If you requested that your care partner not be given the details of your procedure findings, then the procedure report has been included in a sealed envelope for you to review at your convenience later.  YOU SHOULD EXPECT: Some feelings of bloating in the abdomen. Passage of more gas than usual.  Walking can help get rid of the air that was put into your GI tract during the procedure and reduce the bloating. If you had a lower endoscopy (such as a colonoscopy or flexible sigmoidoscopy) you may notice spotting of blood in your stool or on the toilet paper. If you underwent a bowel prep for your procedure, then you may not have a normal bowel movement for a few days.  DIET: Your first meal following the procedure should be a light meal and then it is ok to progress to your normal diet.  A half-sandwich or bowl of soup is an example of a good first meal.  Heavy or fried foods are harder to digest and may make you feel nauseous or bloated.  Likewise meals heavy in dairy and vegetables can cause extra gas to form and this can also increase the bloating.  Drink plenty of fluids but you should avoid alcoholic beverages for 24 hours.  ACTIVITY: Your care partner should  take you home directly after the procedure.  You should plan to take it easy, moving slowly for the rest of the day.  You can resume normal activity the day after the procedure however you should NOT DRIVE or use heavy machinery for 24 hours (because of the sedation medicines used during the test).    SYMPTOMS TO REPORT IMMEDIATELY: A gastroenterologist can be reached at any hour.  During normal business hours, 8:30 AM to 5:00 PM Monday through Friday, call [phone].  After hours and on weekends, please call the GI answering service at [phone] who will take a message and have the physician on call contact you.   Following lower endoscopy (colonoscopy or flexible sigmoidoscopy):  Excessive amounts of blood in the stool  Significant tenderness or worsening of abdominal pains  Swelling of the abdomen that is new, acute  Fever of 100F or higher   FOLLOW UP: If any biopsies were taken you will be contacted by phone or by letter within the next 1-3 weeks.  Call your gastroenterologist if you have not heard about the biopsies in 3 weeks.  Our staff will call the home number listed on your records the next business day following your procedure to check on you and address any questions or concerns that you may have at that time regarding the information given to you following your procedure. This is a courtesy call and so if there is no  answer at the home number and we have not heard from you through the emergency physician on call, we will assume that you have returned to your regular daily activities without incident.  SIGNATURES/CONFIDENTIALITY: You and/or your care partner have signed paperwork which will be entered into your electronic medical record.  These signatures attest to the fact that that the information above on your After Visit Summary has been reviewed and is understood.  Full responsibility of the confidentiality of this discharge information lies with you and/or your  care-partner.  Resume medications. Information given on polyps with discharge instructions.

## 2013-04-13 NOTE — Progress Notes (Signed)
Patient did not experience any of the following events: a burn prior to discharge; a fall within the facility; wrong site/side/patient/procedure/implant event; or a hospital transfer or hospital admission upon discharge from the facility. (G8907) Patient did not have preoperative order for IV antibiotic SSI prophylaxis. (G8918)  

## 2013-04-13 NOTE — Op Note (Signed)
[name]  [address] [place], [address]   COLONOSCOPY PROCEDURE REPORT  PATIENT: Valerie Mcclure, Valerie Mcclure  MR#: [id number] BIRTHDATE: 01-24-1941 , 72  yrs. old GENDER: Female ENDOSCOPIST: Gatha Mayer, MD, [facility] PROCEDURE DATE:  04/13/2013 PROCEDURE:   Colonoscopy with biopsy First Screening Colonoscopy - Avg.  risk and is 50 yrs.  old or older - No.  Prior Negative Screening - Now for repeat screening. 10 or more years since last screening  History of Adenoma - Now for follow-up colonoscopy & has been > or = to 3 yrs.  N/A  Polyps Removed Today? Yes. ASA CLASS:   Class II INDICATIONS:average risk screening and Last colonoscopy performed 10 years ago. MEDICATIONS: propofol (Diprivan) 250mg  IV, MAC sedation, administered by CRNA, and These medications were titrated to patient response per physician's verbal order  DESCRIPTION OF PROCEDURE:   After the risks benefits and alternatives of the procedure were thoroughly explained, informed consent was obtained.  A digital rectal exam revealed internal hemorrhoids.   The LB [id number]  endoscope was introduced through the anus and advanced to the cecum, which was identified by both the appendix and ileocecal valve. No adverse events experienced.   The quality of the prep was excellent using Suprep The instrument was then slowly withdrawn as the colon was fully examined.   COLON FINDINGS: A diminutive sessile polyp was found in the transverse colon.  A polypectomy was performed with cold forceps. The resection was complete and the polyp tissue was completely retrieved.   Moderate diverticulosis was noted in the sigmoid colon.   Moderate sized internal hemorrhoids were found.   The colon mucosa was otherwise normal.   A right colon retroflexion was performed.  Retroflexed views revealed internal hemorrhoids. The time to cecum=8 minutes 35 seconds.  Withdrawal time=8 minutes 42 seconds.  The scope was  withdrawn and the procedure completed. COMPLICATIONS: There were no complications.  ENDOSCOPIC IMPRESSION: 1.   Diminutive sessile polyp was found in the transverse colon; polypectomy was performed with cold forceps 2.   Moderate diverticulosis was noted in the sigmoid colon 3.   Moderate sized internal hemorrhoids 4.   The colon mucosa was otherwise normal  RECOMMENDATIONS: 1.  Await pathology results - Do not anticipate that she will need routine repeat colonoscopy 2.   Consider hemorrhoid banding if they are symptomatic.   eSigned:  Gatha Mayer, MD, [facility] 04/13/2013 11:47 AM cc: Rowe Clack, MD and The Patient

## 2013-04-13 NOTE — Progress Notes (Signed)
Called to room to assist during endoscopic procedure.  Patient ID and intended procedure confirmed with present staff. Received instructions for my participation in the procedure from the performing physician.  

## 2013-04-14 ENCOUNTER — Telehealth: Payer: Self-pay | Admitting: *Deleted

## 2013-04-14 NOTE — Telephone Encounter (Signed)
  Follow up Call-  Call back number 04/13/2013  Post procedure Call Back phone  # [phone]  Permission to leave phone message Yes     Patient questions:  Do you have a fever, pain , or abdominal swelling? no Pain Score  0 *  Have you tolerated food without any problems? yes  Have you been able to return to your normal activities? yes  Do you have any questions about your discharge instructions: Diet   no Medications  no Follow up visit  no  Do you have questions or concerns about your Care? no  Actions: * If pain score is 4 or above: No action needed, pain <4.

## 2013-04-18 ENCOUNTER — Encounter: Payer: Self-pay | Admitting: Internal Medicine

## 2013-04-18 NOTE — Progress Notes (Signed)
Quick Note:  Inflammatory polyp No recalls needed due to age ______

## 2013-05-22 ENCOUNTER — Encounter: Payer: Self-pay | Admitting: Internal Medicine

## 2013-06-18 ENCOUNTER — Other Ambulatory Visit: Payer: Self-pay | Admitting: Internal Medicine

## 2013-08-19 ENCOUNTER — Other Ambulatory Visit: Payer: Self-pay | Admitting: Internal Medicine

## 2013-09-11 ENCOUNTER — Other Ambulatory Visit: Payer: Self-pay

## 2013-09-11 DIAGNOSIS — Z1231 Encounter for screening mammogram for malignant neoplasm of breast: Secondary | ICD-10-CM

## 2013-09-28 ENCOUNTER — Other Ambulatory Visit: Payer: Medicare PPO

## 2013-09-28 ENCOUNTER — Ambulatory Visit: Payer: Medicare PPO

## 2013-10-24 ENCOUNTER — Ambulatory Visit: Payer: Medicare PPO

## 2013-10-24 ENCOUNTER — Other Ambulatory Visit: Payer: Medicare PPO

## 2013-12-06 ENCOUNTER — Other Ambulatory Visit: Payer: Self-pay | Admitting: Internal Medicine

## 2013-12-28 ENCOUNTER — Other Ambulatory Visit: Payer: Self-pay | Admitting: Internal Medicine

## 2013-12-28 DIAGNOSIS — M949 Disorder of cartilage, unspecified: Principal | ICD-10-CM

## 2013-12-28 DIAGNOSIS — M899 Disorder of bone, unspecified: Secondary | ICD-10-CM

## 2014-01-18 ENCOUNTER — Other Ambulatory Visit: Payer: Medicare PPO

## 2014-01-18 ENCOUNTER — Ambulatory Visit: Payer: Medicare PPO

## 2014-01-22 ENCOUNTER — Other Ambulatory Visit: Payer: Self-pay | Admitting: Internal Medicine

## 2014-01-29 ENCOUNTER — Other Ambulatory Visit: Payer: Self-pay | Admitting: Internal Medicine

## 2014-01-29 ENCOUNTER — Telehealth: Payer: Self-pay | Admitting: Internal Medicine

## 2014-01-29 MED ORDER — METOPROLOL TARTRATE 25 MG PO TABS: 25.0000 mg | ORAL_TABLET | Freq: Two times a day (BID) | ORAL | Status: DC

## 2014-01-29 MED ORDER — SIMVASTATIN 10 MG PO TABS: 10.0000 mg | ORAL_TABLET | Freq: Every day | ORAL | Status: DC

## 2014-01-29 MED ORDER — LEVOTHYROXINE SODIUM 50 MCG PO TABS: 50.0000 ug | ORAL_TABLET | Freq: Every day | ORAL | Status: DC

## 2014-01-29 NOTE — Telephone Encounter (Signed)
Ok - one month with 1 refill done thanks

## 2014-01-29 NOTE — Telephone Encounter (Signed)
Left vm for pt to call back to inform her that med sent in today.

## 2014-01-29 NOTE — Telephone Encounter (Signed)
Pt was wondering if Dr. Asa Lente will send her some of synthroid, metoprolol, simvastatin and Levothyroxine until she come in for the appt on 02/15/14. Pt used [facility]. Please advise.

## 2014-02-06 ENCOUNTER — Other Ambulatory Visit: Payer: Self-pay | Admitting: Internal Medicine

## 2014-02-15 ENCOUNTER — Ambulatory Visit: Payer: Medicare PPO | Admitting: Internal Medicine

## 2014-02-15 DIAGNOSIS — Z0289 Encounter for other administrative examinations: Secondary | ICD-10-CM

## 2014-02-20 ENCOUNTER — Encounter (INDEPENDENT_AMBULATORY_CARE_PROVIDER_SITE_OTHER): Payer: Self-pay

## 2014-02-20 ENCOUNTER — Ambulatory Visit
Admission: RE | Admit: 2014-02-20 | Discharge: 2014-02-20 | Disposition: A | Discharge status: Home / Self Care | Payer: Medicare HMO | Source: Ambulatory Visit

## 2014-02-20 ENCOUNTER — Other Ambulatory Visit: Payer: Self-pay | Admitting: Internal Medicine

## 2014-02-20 ENCOUNTER — Ambulatory Visit
Admission: RE | Admit: 2014-02-20 | Discharge: 2014-02-20 | Disposition: A | Discharge status: Home / Self Care | Payer: Medicare HMO | Source: Ambulatory Visit | Attending: Internal Medicine | Admitting: Internal Medicine

## 2014-02-20 DIAGNOSIS — Z1231 Encounter for screening mammogram for malignant neoplasm of breast: Secondary | ICD-10-CM

## 2014-02-20 DIAGNOSIS — M899 Disorder of bone, unspecified: Secondary | ICD-10-CM

## 2014-02-20 DIAGNOSIS — R928 Other abnormal and inconclusive findings on diagnostic imaging of breast: Secondary | ICD-10-CM

## 2014-02-20 DIAGNOSIS — M949 Disorder of cartilage, unspecified: Principal | ICD-10-CM

## 2014-02-27 ENCOUNTER — Ambulatory Visit
Admission: RE | Admit: 2014-02-27 | Discharge: 2014-02-27 | Disposition: A | Discharge status: Home / Self Care | Payer: Medicare HMO | Source: Ambulatory Visit | Attending: Internal Medicine | Admitting: Internal Medicine

## 2014-02-27 DIAGNOSIS — R928 Other abnormal and inconclusive findings on diagnostic imaging of breast: Secondary | ICD-10-CM

## 2014-03-06 ENCOUNTER — Other Ambulatory Visit: Payer: Self-pay | Admitting: Internal Medicine

## 2014-03-09 ENCOUNTER — Other Ambulatory Visit: Payer: Self-pay | Admitting: Internal Medicine

## 2014-03-14 ENCOUNTER — Other Ambulatory Visit: Payer: Self-pay

## 2014-03-14 MED ORDER — VALSARTAN-HYDROCHLOROTHIAZIDE 160-25 MG PO TABS: ORAL_TABLET | ORAL | Status: DC

## 2014-03-27 ENCOUNTER — Other Ambulatory Visit: Payer: Self-pay | Admitting: Internal Medicine

## 2014-04-03 ENCOUNTER — Encounter: Payer: Self-pay | Admitting: Internal Medicine

## 2014-04-03 ENCOUNTER — Other Ambulatory Visit (INDEPENDENT_AMBULATORY_CARE_PROVIDER_SITE_OTHER): Payer: Medicare HMO

## 2014-04-03 ENCOUNTER — Ambulatory Visit (INDEPENDENT_AMBULATORY_CARE_PROVIDER_SITE_OTHER): Payer: Medicare HMO | Admitting: Internal Medicine

## 2014-04-03 VITALS — BP 130/84 | HR 54 | Temp 98.1°F | Ht 64.0 in | Wt 163.2 lb

## 2014-04-03 DIAGNOSIS — E785 Hyperlipidemia, unspecified: Secondary | ICD-10-CM

## 2014-04-03 DIAGNOSIS — I10 Essential (primary) hypertension: Secondary | ICD-10-CM

## 2014-04-03 DIAGNOSIS — G894 Chronic pain syndrome: Secondary | ICD-10-CM

## 2014-04-03 DIAGNOSIS — Z23 Encounter for immunization: Secondary | ICD-10-CM

## 2014-04-03 DIAGNOSIS — N951 Menopausal and female climacteric states: Secondary | ICD-10-CM

## 2014-04-03 DIAGNOSIS — Z0000 Encounter for general adult medical examination without abnormal findings: Secondary | ICD-10-CM

## 2014-04-03 DIAGNOSIS — E039 Hypothyroidism, unspecified: Secondary | ICD-10-CM

## 2014-04-03 DIAGNOSIS — N959 Unspecified menopausal and perimenopausal disorder: Secondary | ICD-10-CM

## 2014-04-03 LAB — BASIC METABOLIC PANEL
BUN: 17 mg/dL (ref 6–23)
CALCIUM: 9.1 mg/dL (ref 8.4–10.5)
CO2: 33 mEq/L — ABNORMAL HIGH (ref 19–32)
Chloride: 95 mEq/L — ABNORMAL LOW (ref 96–112)
Creatinine, Ser: 1.2 mg/dL (ref 0.4–1.2)
GFR: 45.29 mL/min — AB (ref >60.00)
Glucose, Bld: 104 mg/dL — ABNORMAL HIGH (ref 70–99)
Potassium: 3.7 mEq/L (ref 3.5–5.1)
Sodium: 137 mEq/L (ref 135–145)

## 2014-04-03 LAB — LIPID PANEL
CHOLESTEROL: 151 mg/dL (ref 0–200)
HDL: 41.1 mg/dL (ref >39.00)
LDL Cholesterol: 86 mg/dL (ref 0–99)
NonHDL: 109.9
TRIGLYCERIDES: 121 mg/dL (ref 0.0–149.0)
Total CHOL/HDL Ratio: 4
VLDL: 24.2 mg/dL (ref 0.0–40.0)

## 2014-04-03 LAB — TSH: TSH: 2.81 u[IU]/mL (ref 0.35–4.50)

## 2014-04-03 MED ORDER — VALSARTAN-HYDROCHLOROTHIAZIDE 160-25 MG PO TABS: 1.0000 | ORAL_TABLET | Freq: Every day | ORAL | Status: DC

## 2014-04-03 MED ORDER — NORTRIPTYLINE HCL 10 MG PO CAPS: 10.0000 mg | ORAL_CAPSULE | Freq: Every day | ORAL | Status: DC

## 2014-04-03 MED ORDER — FLUOXETINE HCL 20 MG PO TABS: 20.0000 mg | ORAL_TABLET | Freq: Every day | ORAL | Status: DC

## 2014-04-03 MED ORDER — SIMVASTATIN 10 MG PO TABS: 10.0000 mg | ORAL_TABLET | Freq: Every day | ORAL | Status: DC

## 2014-04-03 MED ORDER — ESTRADIOL 0.5 MG PO TABS: 0.5000 mg | ORAL_TABLET | Freq: Every day | ORAL | Status: DC

## 2014-04-03 MED ORDER — LEVOTHYROXINE SODIUM 50 MCG PO TABS: 50.0000 ug | ORAL_TABLET | Freq: Every day | ORAL | Status: DC

## 2014-04-03 MED ORDER — FUROSEMIDE 20 MG PO TABS: 20.0000 mg | ORAL_TABLET | Freq: Every day | ORAL | Status: DC | PRN

## 2014-04-03 MED ORDER — METOPROLOL TARTRATE 25 MG PO TABS: 25.0000 mg | ORAL_TABLET | Freq: Two times a day (BID) | ORAL | Status: DC

## 2014-04-03 NOTE — Progress Notes (Signed)
Subjective:    Patient ID: Valerie Mcclure, female    DOB: 1941-04-02, 73 y.o.   MRN: [id number]  HPI   Here for medicare wellness  Diet: heart healthy or DM if diabetic Physical activity: sedentary Depression/mood screen: negative Hearing: intact to whispered voice Visual acuity: grossly normal, performs annual eye exam  ADLs: capable Fall risk: none Home safety: good Cognitive evaluation: intact to orientation, naming, recall and repetition EOL planning: adv directives, full code/ I agree  I have personally reviewed and have noted 1. The patient's medical and social history 2. Their use of alcohol, tobacco or illicit drugs 3. Their current medications and supplements 4. The patient's functional ability including ADL's, fall risks, home safety risks and hearing or visual impairment. 5. Diet and physical activities 6. Evidence for depression or mood disorders  Also reviewed chronic medical issues and interval medical events  Past Medical History  Diagnosis Date  . Unspecified hypothyroidism   . Chronic low back pain   . Unspecified essential hypertension   . Hyperlipidemia   . Osteopenia   . Blood transfusion without reported diagnosis    Family History  Problem Relation Age of Onset  . Heart disease Father 38    MI age 21s  . Lung cancer Brother 62  . Colon cancer Neg Hx   . Esophageal cancer Neg Hx   . Rectal cancer Neg Hx   . Stomach cancer Neg Hx    History  Substance Use Topics  . Smoking status: Never Smoker   . Smokeless tobacco: Never Used  . Alcohol Use: No    Review of Systems  Constitutional: Negative for fatigue and unexpected weight change.  Respiratory: Negative for cough, shortness of breath and wheezing.   Cardiovascular: Negative for chest pain, palpitations and leg swelling.  Gastrointestinal: Positive for abdominal pain (chronic RUQ neuropathic pain s/p chole @ incision site). Negative for nausea and diarrhea.  Musculoskeletal:  Positive for back pain (chronic, follows with Dr Sammie Bench).  Neurological: Negative for dizziness, weakness, light-headedness and headaches.  Psychiatric/Behavioral: Negative for dysphoric mood. The patient is not nervous/anxious.   All other systems reviewed and are negative.      Objective:   Physical Exam  BP 130/84  Pulse 54  Temp(Src) 98.1 F (36.7 C) (Oral)  Ht 5\' 4"  (1.626 m)  Wt 163 lb 4 oz (74.05 kg)  BMI 28.01 kg/m2  SpO2 97% Wt Readings from Last 3 Encounters:  04/03/14 163 lb 4 oz (74.05 kg)  03/29/13 164 lb 6.4 oz (74.571 kg)  11/14/12 162 lb 12.8 oz (73.846 kg)   Constitutional: She is overweight, appears well-developed and well-nourished. No distress.  HENT: Head: Normocephalic and atraumatic. Ears: B TMs ok, no erythema or effusion; Nose: Nose normal. Mouth/Throat: Oropharynx is clear and moist. No oropharyngeal exudate.  Eyes: Conjunctivae and EOM are normal. Pupils are equal, round, and reactive to light. No scleral icterus.  Neck: Normal range of motion. Neck supple. No JVD present. No thyromegaly present.  Cardiovascular: Normal rate, regular rhythm and normal heart sounds.  No murmur heard. No BLE edema. Pulmonary/Chest: Effort normal and breath sounds normal. No respiratory distress. She has no wheezes.  Abdominal: Soft. Bowel sounds are normal. She exhibits no distension. There is no tenderness. no masses GU/breast: defer to gyn Musculoskeletal: Normal range of motion, no joint effusions. No gross deformities Neurological: She is alert and oriented to person, place, and time. No cranial nerve deficit. Coordination, balance, strength, speech and gait are  normal.  Skin: Skin is warm and dry. No rash noted. No erythema.  Psychiatric: She has a normal mood and affect. Her behavior is normal. Judgment and thought content normal.    Lab Results  Component Value Date   WBC 6.5 11/14/2012   HGB 13.1 11/14/2012   HCT 37.8 11/14/2012   PLT 263.0 11/14/2012    GLUCOSE 99 11/14/2012   CHOL 150 11/14/2012   TRIG 175.0* 11/14/2012   HDL 47.00 11/14/2012   LDLDIRECT 133.2 11/18/2010   LDLCALC 68 11/14/2012   ALT 16 11/14/2012   AST 19 11/14/2012   NA 138 11/14/2012   K 3.9 11/14/2012   CL 98 11/14/2012   CREATININE 1.2 11/14/2012   BUN 19 11/14/2012   CO2 36* 11/14/2012   TSH 3.54 11/14/2012   INR 1.2 12/26/2008    Mm Digital Diagnostic Unilat R  02/27/2014   CLINICAL DATA:  Call back from screening for a possible right breast mass.  EXAM: DIGITAL DIAGNOSTIC  RIGHT MAMMOGRAM  ULTRASOUND RIGHT BREAST  COMPARISON:  Prior exams  ACR Breast Density Category b: There are scattered areas of fibroglandular density.  FINDINGS: The possible mass persists on spot compression imaging. It projects laterally in the middle third of the breast.  Ultrasound is performed, showing a simple oval cyst in the 10 o'clock position, 8 cm from the nipple, measuring 5.8 mm x 2.8 mm x 5 mm. More anteriorly, in the 8:30 position 4 cm from the nipple, there is a lobulated partly septated cyst, which correlates an size, shape and position to the mammographic abnormality. It measures 4.7 mm x 2 3 mm x 4.9 mm. There are no solid mass or suspicious lesions.  IMPRESSION: Benign right breast cysts.  No evidence of malignancy.  RECOMMENDATION: Screening mammogram in one year.(Code:SM-B-01Y)  I have discussed the findings and recommendations with the patient. Results were also provided in writing at the conclusion of the visit. If applicable, a reminder letter will be sent to the patient regarding the next appointment.  BI-RADS CATEGORY  2: Benign.   Electronically Signed   By: Lajean Manes M.D.   On: 02/27/2014 14:03   US Breast Ltd Uni Right Inc Axilla  02/27/2014   CLINICAL DATA:  Call back from screening for a possible right breast mass.  EXAM: DIGITAL DIAGNOSTIC  RIGHT MAMMOGRAM  ULTRASOUND RIGHT BREAST  COMPARISON:  Prior exams  ACR Breast Density Category b: There are scattered areas of  fibroglandular density.  FINDINGS: The possible mass persists on spot compression imaging. It projects laterally in the middle third of the breast.  Ultrasound is performed, showing a simple oval cyst in the 10 o'clock position, 8 cm from the nipple, measuring 5.8 mm x 2.8 mm x 5 mm. More anteriorly, in the 8:30 position 4 cm from the nipple, there is a lobulated partly septated cyst, which correlates an size, shape and position to the mammographic abnormality. It measures 4.7 mm x 2 3 mm x 4.9 mm. There are no solid mass or suspicious lesions.  IMPRESSION: Benign right breast cysts.  No evidence of malignancy.  RECOMMENDATION: Screening mammogram in one year.(Code:SM-B-01Y)  I have discussed the findings and recommendations with the patient. Results were also provided in writing at the conclusion of the visit. If applicable, a reminder letter will be sent to the patient regarding the next appointment.  BI-RADS CATEGORY  2: Benign.   Electronically Signed   By: Lajean Manes M.D.   On: 02/27/2014 14:03  Assessment & Plan:   CPX/AWV/v70.0 - Today patient counseled on age appropriate routine health concerns for screening and prevention, each reviewed and up to date or declined. Immunizations reviewed and up to date or declined. Labs ordered and reviewed. Risk factors for depression reviewed and negative. Hearing function and visual acuity are intact. ADLs screened and addressed as needed. Functional ability and level of safety reviewed and appropriate. Education, counseling and referrals performed based on assessed risks today. Patient provided with a copy of personalized plan for preventive services.  Problem List Items Addressed This Visit   CHRONIC PAIN SYNDROME     Follows with Dr. Hardin Negus - on Kadian (MSO4) 20mg  twice daily  Neuropathic pain in RUQ s/p chole but intol of gabapentin because of swelling Start low dose nortriptyline qhs now - we reviewed potential risk/benefit and possible side  effects - pt understands and agrees to same  - erx done    HYPERLIPIDEMIA     started rx treatment for same 11/2010 Check lipids annually - last reviewed The current medical regimen is effective;  continue present plan and medications.     Relevant Medications      simvastatin (ZOCOR) tablet      valsartan-hydrochlorothiazide (DIOVAN-HCT) 160-25 MG per tablet      metoprolol tartrate (LOPRESSOR) tablet      furosemide (LASIX) tablet   Other Relevant Orders      Lipid panel   HYPERTENSION      conitnue prn daily dose furosemide for chronic dep edema (started 5/12) The current medical regimen is effective;  continue present plan and medications.  BP Readings from Last 3 Encounters:  04/03/14 130/84  04/13/13 130/57  11/14/12 112/72      Relevant Medications      simvastatin (ZOCOR) tablet      valsartan-hydrochlorothiazide (DIOVAN-HCT) 160-25 MG per tablet      metoprolol tartrate (LOPRESSOR) tablet      furosemide (LASIX) tablet   Other Relevant Orders      Basic metabolic panel   HYPOTHYROIDISM      Dose increase 11/2010 -  recheck TSH now and adjust as needed Lab Results  Component Value Date   TSH 3.54 11/14/2012      Relevant Medications      levothyroxine (SYNTHROID, LEVOTHROID) tablet      metoprolol tartrate (LOPRESSOR) tablet   Other Relevant Orders      TSH   Postmenopausal disorder     On HRT for years due to severe vasomotor menopause symptoms Weaned from 2mg  qd to 1mg  qd in 2014 - continue wean down now (0.5mg  qd) - If unable to tolerate dose reduction, will refer to gyn for med mgmt of same     Other Visit Diagnoses   Routine general medical examination at a health care facility    -  Primary    Need for prophylactic vaccination and inoculation against influenza        Relevant Orders       Flu Vaccine QUAD 36+ mos PF IM (Fluarix Quad PF) (Completed)

## 2014-04-03 NOTE — Assessment & Plan Note (Signed)
On HRT for years due to severe vasomotor menopause symptoms Weaned from 2mg  qd to 1mg  qd in 2014 - continue wean down now (0.5mg  qd) - If unable to tolerate dose reduction, will refer to gyn for med mgmt of same

## 2014-04-03 NOTE — Patient Instructions (Addendum)
It was good to see you today.  We have reviewed your prior records including labs and tests today  Health Maintenance reviewed - Your annual flu shot was given and/or updated today. All other recommended immunizations and age-appropriate screenings are up-to-date.  Test(s) ordered today. Your results will be released to [facility] (or called to you) after review, usually within 72hours after test completion. If any changes need to be made, you will be notified at that same time.  Medications reviewed and updated Start nortriptyline 28m at night to help with nerve pain Decrease estrogen pill to 0.576mdaily No other changes recommended at this time. Your prescription(s)/refills have been submitted to your pharmacy. Please take as directed and contact our office if you believe you are having problem(s) with the medication(s).  Please schedule followup in 12 months for annual exam and labs, call sooner if problems. Health Maintenance Adopting a healthy lifestyle and getting preventive care can go a long way to promote health and wellness. Talk with your health care provider about what schedule of regular examinations is right for you. This is a good chance for you to check in with your provider about disease prevention and staying healthy. In between checkups, there are plenty of things you can do on your own. Experts have done a lot of research about which lifestyle changes and preventive measures are most likely to keep you healthy. Ask your health care provider for more information. WEIGHT AND DIET  Eat a healthy diet  Be sure to include plenty of vegetables, fruits, low-fat dairy products, and lean protein.  Do not eat a lot of foods high in solid fats, added sugars, or salt.  Get regular exercise. This is one of the most important things you can do for your health.  Most adults should exercise for at least 150 minutes each week. The exercise should increase your heart rate and make you  sweat (moderate-intensity exercise).  Most adults should also do strengthening exercises at least twice a week. This is in addition to the moderate-intensity exercise.  Maintain a healthy weight  Body mass index (BMI) is a measurement that can be used to identify possible weight problems. It estimates body fat based on height and weight. Your health care provider can help determine your BMI and help you achieve or maintain a healthy weight.  For females 2065ears of age and older:   A BMI below 18.5 is considered underweight.  A BMI of 18.5 to 24.9 is normal.  A BMI of 25 to 29.9 is considered overweight.  A BMI of 30 and above is considered obese.  Watch levels of cholesterol and blood lipids  You should start having your blood tested for lipids and cholesterol at 204ears of age, then have this test every 5 years.  You may need to have your cholesterol levels checked more often if:  Your lipid or cholesterol levels are high.  You are older than 5040ears of age.  You are at high risk for heart disease.  CANCER SCREENING   Lung Cancer  Lung cancer screening is recommended for adults 5510060ears old who are at high risk for lung cancer because of a history of smoking.  A yearly low-dose CT scan of the lungs is recommended for people who:  Currently smoke.  Have quit within the past 15 years.  Have at least a 30-pack-year history of smoking. A pack year is smoking an average of one pack of cigarettes a day for  1 year.  Yearly screening should continue until it has been 15 years since you quit.  Yearly screening should stop if you develop a health problem that would prevent you from having lung cancer treatment.  Breast Cancer  Practice breast self-awareness. This means understanding how your breasts normally appear and feel.  It also means doing regular breast self-exams. Let your health care provider know about any changes, no matter how small.  If you are in  your 20s or 30s, you should have a clinical breast exam (CBE) by a health care provider every 1-3 years as part of a regular health exam.  If you are 25 or older, have a CBE every year. Also consider having a breast X-ray (mammogram) every year.  If you have a family history of breast cancer, talk to your health care provider about genetic screening.  If you are at high risk for breast cancer, talk to your health care provider about having an MRI and a mammogram every year.  Breast cancer gene (BRCA) assessment is recommended for women who have family members with BRCA-related cancers. BRCA-related cancers include:  Breast.  Ovarian.  Tubal.  Peritoneal cancers.  Results of the assessment will determine the need for genetic counseling and BRCA1 and BRCA2 testing. Cervical Cancer Routine pelvic examinations to screen for cervical cancer are no longer recommended for nonpregnant women who are considered low risk for cancer of the pelvic organs (ovaries, uterus, and vagina) and who do not have symptoms. A pelvic examination may be necessary if you have symptoms including those associated with pelvic infections. Ask your health care provider if a screening pelvic exam is right for you.   The Pap test is the screening test for cervical cancer for women who are considered at risk.  If you had a hysterectomy for a problem that was not cancer or a condition that could lead to cancer, then you no longer need Pap tests.  If you are older than 65 years, and you have had normal Pap tests for the past 10 years, you no longer need to have Pap tests.  If you have had past treatment for cervical cancer or a condition that could lead to cancer, you need Pap tests and screening for cancer for at least 20 years after your treatment.  If you no longer get a Pap test, assess your risk factors if they change (such as having a new sexual partner). This can affect whether you should start being screened  again.  Some women have medical problems that increase their chance of getting cervical cancer. If this is the case for you, your health care provider may recommend more frequent screening and Pap tests.  The human papillomavirus (HPV) test is another test that may be used for cervical cancer screening. The HPV test looks for the virus that can cause cell changes in the cervix. The cells collected during the Pap test can be tested for HPV.  The HPV test can be used to screen women 34 years of age and older. Getting tested for HPV can extend the interval between normal Pap tests from three to five years.  An HPV test also should be used to screen women of any age who have unclear Pap test results.  After 74 years of age, women should have HPV testing as often as Pap tests.  Colorectal Cancer  This type of cancer can be detected and often prevented.  Routine colorectal cancer screening usually begins at 74 years of  age and continues through 74 years of age.  Your health care provider may recommend screening at an earlier age if you have risk factors for colon cancer.  Your health care provider may also recommend using home test kits to check for hidden blood in the stool.  A small camera at the end of a tube can be used to examine your colon directly (sigmoidoscopy or colonoscopy). This is done to check for the earliest forms of colorectal cancer.  Routine screening usually begins at age 44.  Direct examination of the colon should be repeated every 5-10 years through 74 years of age. However, you may need to be screened more often if early forms of precancerous polyps or small growths are found. Skin Cancer  Check your skin from head to toe regularly.  Tell your health care provider about any new moles or changes in moles, especially if there is a change in a mole's shape or color.  Also tell your health care provider if you have a mole that is larger than the size of a pencil  eraser.  Always use sunscreen. Apply sunscreen liberally and repeatedly throughout the day.  Protect yourself by wearing long sleeves, pants, a wide-brimmed hat, and sunglasses whenever you are outside. HEART DISEASE, DIABETES, AND HIGH BLOOD PRESSURE   Have your blood pressure checked at least every 1-2 years. High blood pressure causes heart disease and increases the risk of stroke.  If you are between 6 years and 12 years old, ask your health care provider if you should take aspirin to prevent strokes.  Have regular diabetes screenings. This involves taking a blood sample to check your fasting blood sugar level.  If you are at a normal weight and have a low risk for diabetes, have this test once every three years after 74 years of age.  If you are overweight and have a high risk for diabetes, consider being tested at a younger age or more often. PREVENTING INFECTION  Hepatitis B  If you have a higher risk for hepatitis B, you should be screened for this virus. You are considered at high risk for hepatitis B if:  You were born in a country where hepatitis B is common. Ask your health care provider which countries are considered high risk.  Your parents were born in a high-risk country, and you have not been immunized against hepatitis B (hepatitis B vaccine).  You have HIV or AIDS.  You use needles to inject street drugs.  You live with someone who has hepatitis B.  You have had sex with someone who has hepatitis B.  You get hemodialysis treatment.  You take certain medicines for conditions, including cancer, organ transplantation, and autoimmune conditions. Hepatitis C  Blood testing is recommended for:  Everyone born from [birth year] through 1966.  Anyone with known risk factors for hepatitis C. Sexually transmitted infections (STIs)  You should be screened for sexually transmitted infections (STIs) including gonorrhea and chlamydia if:  You are sexually active and are  younger than 74 years of age.  You are older than 74 years of age and your health care provider tells you that you are at risk for this type of infection.  Your sexual activity has changed since you were last screened and you are at an increased risk for chlamydia or gonorrhea. Ask your health care provider if you are at risk.  If you do not have HIV, but are at risk, it may be recommended that you take a  prescription medicine daily to prevent HIV infection. This is called pre-exposure prophylaxis (PrEP). You are considered at risk if:  You are sexually active and do not regularly use condoms or know the HIV status of your partner(s).  You take drugs by injection.  You are sexually active with a partner who has HIV. Talk with your health care provider about whether you are at high risk of being infected with HIV. If you choose to begin PrEP, you should first be tested for HIV. You should then be tested every 3 months for as long as you are taking PrEP.  PREGNANCY   If you are premenopausal and you may become pregnant, ask your health care provider about preconception counseling.  If you may become pregnant, take 400 to 800 micrograms (mcg) of folic acid every day.  If you want to prevent pregnancy, talk to your health care provider about birth control (contraception). OSTEOPOROSIS AND MENOPAUSE   Osteoporosis is a disease in which the bones lose minerals and strength with aging. This can result in serious bone fractures. Your risk for osteoporosis can be identified using a bone density scan.  If you are 71 years of age or older, or if you are at risk for osteoporosis and fractures, ask your health care provider if you should be screened.  Ask your health care provider whether you should take a calcium or vitamin D supplement to lower your risk for osteoporosis.  Menopause may have certain physical symptoms and risks.  Hormone replacement therapy may reduce some of these symptoms and  risks. Talk to your health care provider about whether hormone replacement therapy is right for you.  HOME CARE INSTRUCTIONS   Schedule regular health, dental, and eye exams.  Stay current with your immunizations.   Do not use any tobacco products including cigarettes, chewing tobacco, or electronic cigarettes.  If you are pregnant, do not drink alcohol.  If you are breastfeeding, limit how much and how often you drink alcohol.  Limit alcohol intake to no more than 1 drink per day for nonpregnant women. One drink equals 12 ounces of beer, 5 ounces of wine, or 1 ounces of hard liquor.  Do not use street drugs.  Do not share needles.  Ask your health care provider for help if you need support or information about quitting drugs.  Tell your health care provider if you often feel depressed.  Tell your health care provider if you have ever been abused or do not feel safe at home. Document Released: 01/19/2011 Document Revised: 11/20/2013 Document Reviewed: 06/07/2013 [employer] Patient Information 2015 [place], [place]. This information is not intended to replace advice given to you by your health care provider. Make sure you discuss any questions you have with your health care provider.

## 2014-04-03 NOTE — Assessment & Plan Note (Signed)
started rx treatment for same 11/2010 Check lipids annually - last reviewed The current medical regimen is effective;  continue present plan and medications.

## 2014-04-03 NOTE — Assessment & Plan Note (Signed)
conitnue prn daily dose furosemide for chronic dep edema (started 5/12) The current medical regimen is effective;  continue present plan and medications.  BP Readings from Last 3 Encounters:  04/03/14 130/84  04/13/13 130/57  11/14/12 112/72

## 2014-04-03 NOTE — Assessment & Plan Note (Signed)
Follows with Dr. Hardin Negus - on Kadian (MSO4) 20mg  twice daily  Neuropathic pain in RUQ s/p chole but intol of gabapentin because of swelling Start low dose nortriptyline qhs now - we reviewed potential risk/benefit and possible side effects - pt understands and agrees to same  - erx done

## 2014-04-03 NOTE — Progress Notes (Signed)
Pre visit review using our clinic review tool, if applicable. No additional management support is needed unless otherwise documented below in the visit note. 

## 2014-04-03 NOTE — Assessment & Plan Note (Signed)
Dose increase 11/2010 -  recheck TSH now and adjust as needed Lab Results  Component Value Date   TSH 3.54 11/14/2012

## 2014-04-27 ENCOUNTER — Other Ambulatory Visit: Payer: Self-pay

## 2014-04-27 MED ORDER — FLUOXETINE HCL 20 MG PO CAPS: 20.0000 mg | ORAL_CAPSULE | Freq: Every day | ORAL | Status: DC

## 2014-04-29 ENCOUNTER — Other Ambulatory Visit: Payer: Self-pay | Admitting: Internal Medicine

## 2014-05-28 ENCOUNTER — Other Ambulatory Visit: Payer: Self-pay | Admitting: Internal Medicine

## 2014-05-29 ENCOUNTER — Other Ambulatory Visit: Payer: Self-pay | Admitting: Internal Medicine

## 2014-07-03 ENCOUNTER — Other Ambulatory Visit (HOSPITAL_COMMUNITY): Payer: Self-pay | Admitting: Anesthesiology

## 2014-07-03 ENCOUNTER — Ambulatory Visit (HOSPITAL_COMMUNITY)
Admission: RE | Admit: 2014-07-03 | Discharge: 2014-07-03 | Disposition: A | Discharge status: Home / Self Care | Payer: Medicare HMO | Source: Ambulatory Visit | Attending: Anesthesiology | Admitting: Anesthesiology

## 2014-07-03 DIAGNOSIS — M25561 Pain in right knee: Secondary | ICD-10-CM

## 2014-07-03 DIAGNOSIS — M179 Osteoarthritis of knee, unspecified: Secondary | ICD-10-CM | POA: Diagnosis not present

## 2014-08-15 DIAGNOSIS — M1711 Unilateral primary osteoarthritis, right knee: Secondary | ICD-10-CM | POA: Diagnosis not present

## 2014-08-15 DIAGNOSIS — M4155 Other secondary scoliosis, thoracolumbar region: Secondary | ICD-10-CM | POA: Diagnosis not present

## 2014-08-15 DIAGNOSIS — M47816 Spondylosis without myelopathy or radiculopathy, lumbar region: Secondary | ICD-10-CM | POA: Diagnosis not present

## 2014-08-15 DIAGNOSIS — G894 Chronic pain syndrome: Secondary | ICD-10-CM | POA: Diagnosis not present

## 2014-09-12 DIAGNOSIS — M47816 Spondylosis without myelopathy or radiculopathy, lumbar region: Secondary | ICD-10-CM | POA: Diagnosis not present

## 2014-09-12 DIAGNOSIS — M4155 Other secondary scoliosis, thoracolumbar region: Secondary | ICD-10-CM | POA: Diagnosis not present

## 2014-09-12 DIAGNOSIS — M1711 Unilateral primary osteoarthritis, right knee: Secondary | ICD-10-CM | POA: Diagnosis not present

## 2014-09-12 DIAGNOSIS — G894 Chronic pain syndrome: Secondary | ICD-10-CM | POA: Diagnosis not present

## 2014-10-10 DIAGNOSIS — M1711 Unilateral primary osteoarthritis, right knee: Secondary | ICD-10-CM | POA: Diagnosis not present

## 2014-10-10 DIAGNOSIS — M4155 Other secondary scoliosis, thoracolumbar region: Secondary | ICD-10-CM | POA: Diagnosis not present

## 2014-10-10 DIAGNOSIS — M47816 Spondylosis without myelopathy or radiculopathy, lumbar region: Secondary | ICD-10-CM | POA: Diagnosis not present

## 2014-10-10 DIAGNOSIS — G894 Chronic pain syndrome: Secondary | ICD-10-CM | POA: Diagnosis not present

## 2014-11-14 DIAGNOSIS — M47816 Spondylosis without myelopathy or radiculopathy, lumbar region: Secondary | ICD-10-CM | POA: Diagnosis not present

## 2014-11-14 DIAGNOSIS — M1711 Unilateral primary osteoarthritis, right knee: Secondary | ICD-10-CM | POA: Diagnosis not present

## 2014-11-14 DIAGNOSIS — G894 Chronic pain syndrome: Secondary | ICD-10-CM | POA: Diagnosis not present

## 2014-11-14 DIAGNOSIS — M4155 Other secondary scoliosis, thoracolumbar region: Secondary | ICD-10-CM | POA: Diagnosis not present

## 2014-12-12 DIAGNOSIS — M47816 Spondylosis without myelopathy or radiculopathy, lumbar region: Secondary | ICD-10-CM | POA: Diagnosis not present

## 2014-12-12 DIAGNOSIS — Z79891 Long term (current) use of opiate analgesic: Secondary | ICD-10-CM | POA: Diagnosis not present

## 2014-12-12 DIAGNOSIS — M1711 Unilateral primary osteoarthritis, right knee: Secondary | ICD-10-CM | POA: Diagnosis not present

## 2014-12-12 DIAGNOSIS — G894 Chronic pain syndrome: Secondary | ICD-10-CM | POA: Diagnosis not present

## 2014-12-12 DIAGNOSIS — M4155 Other secondary scoliosis, thoracolumbar region: Secondary | ICD-10-CM | POA: Diagnosis not present

## 2015-01-09 ENCOUNTER — Encounter: Payer: Self-pay | Admitting: Internal Medicine

## 2015-01-09 ENCOUNTER — Ambulatory Visit (INDEPENDENT_AMBULATORY_CARE_PROVIDER_SITE_OTHER): Payer: Medicare Other | Admitting: Internal Medicine

## 2015-01-09 VITALS — BP 128/72 | HR 63 | Temp 98.4°F | Resp 16 | Wt 178.0 lb

## 2015-01-09 DIAGNOSIS — R091 Pleurisy: Secondary | ICD-10-CM | POA: Diagnosis not present

## 2015-01-09 DIAGNOSIS — J209 Acute bronchitis, unspecified: Secondary | ICD-10-CM | POA: Diagnosis not present

## 2015-01-09 DIAGNOSIS — G894 Chronic pain syndrome: Secondary | ICD-10-CM | POA: Diagnosis not present

## 2015-01-09 DIAGNOSIS — M47816 Spondylosis without myelopathy or radiculopathy, lumbar region: Secondary | ICD-10-CM | POA: Diagnosis not present

## 2015-01-09 DIAGNOSIS — M1711 Unilateral primary osteoarthritis, right knee: Secondary | ICD-10-CM | POA: Diagnosis not present

## 2015-01-09 DIAGNOSIS — M4155 Other secondary scoliosis, thoracolumbar region: Secondary | ICD-10-CM | POA: Diagnosis not present

## 2015-01-09 MED ORDER — DOXYCYCLINE HYCLATE 100 MG PO TABS: 100.0000 mg | ORAL_TABLET | Freq: Two times a day (BID) | ORAL | Status: DC

## 2015-01-09 MED ORDER — PREDNISONE 10 MG PO TABS: ORAL_TABLET | ORAL | Status: DC

## 2015-01-09 NOTE — Progress Notes (Signed)
Pre visit review using our clinic review tool, if applicable. No additional management support is needed unless otherwise documented below in the visit note. 

## 2015-01-09 NOTE — Progress Notes (Signed)
   Subjective:    Patient ID: Valerie Mcclure, female    DOB: March 17, 1941, 74 y.o.   MRN: [id number]  HPI  Her symptoms began 1.5 weeks ago as a nonproductive cough. This was associated with some anxiety and anorexia. The cough disturbs sleep; last week it began to be productive of grayish mucus. She's had chills and sweats. She's also noted wheezing. She used her husband's nebulizer. She's had some pleuritic discomfort in the left lateral thorax with cough or deep breathing. Over-the-counter medicines including Mucinex were of no benefit. She's never smoked. She has no history of asthma.    Review of Systems Frontal headache, facial pain , nasal purulence, dental pain, sore throat , otic pain or otic discharge denied. No fever present. No dyspnea.      Objective:   Physical Exam  General appearance:Adequately nourished; no acute distress or increased work of breathing is present.    Lymphatic: No  lymphadenopathy about the head, neck, or axilla .  Eyes: No conjunctival inflammation or lid edema is present. There is no scleral icterus.  Ears:  External ear exam shows no significant lesions or deformities.  Otoscopic examination reveals clear canals, tympanic membranes are intact bilaterally without bulging, retraction, inflammation or discharge.  Nose:  External nasal examination shows no deformity or inflammation. Nasal mucosa are erythematous without lesions or exudates No septal dislocation or deviation.No obstruction to airflow.   Oral exam: Dental hygiene is good; lips and gums are healthy appearing.There is no oropharyngeal erythema or exudate .  Neck:  No deformities, thyromegaly, masses, or tenderness noted.   Supple with full range of motion without pain.   Heart:  Normal rate and regular rhythm. S1 and S2 normal without gallop, murmur, click, rub or other extra sounds.   Lungs:Chest clear to auscultation; no wheezes, rhonchi,rales ,or rubs present.  Extremities:  No  cyanosis, edema, or clubbing  noted    Skin: Warm & dry w/o tenting or jaundice. No significant lesions or rash.        Assessment & Plan:  #1 acute bronchitis w/o bronchospasm #2pleurisy Plan: See orders and recommendations

## 2015-01-09 NOTE — Patient Instructions (Signed)

## 2015-02-13 DIAGNOSIS — M1711 Unilateral primary osteoarthritis, right knee: Secondary | ICD-10-CM | POA: Diagnosis not present

## 2015-02-13 DIAGNOSIS — G894 Chronic pain syndrome: Secondary | ICD-10-CM | POA: Diagnosis not present

## 2015-02-13 DIAGNOSIS — M4155 Other secondary scoliosis, thoracolumbar region: Secondary | ICD-10-CM | POA: Diagnosis not present

## 2015-02-13 DIAGNOSIS — M47816 Spondylosis without myelopathy or radiculopathy, lumbar region: Secondary | ICD-10-CM | POA: Diagnosis not present

## 2015-02-14 ENCOUNTER — Other Ambulatory Visit: Payer: Self-pay | Admitting: Internal Medicine

## 2015-02-15 ENCOUNTER — Other Ambulatory Visit: Payer: Self-pay

## 2015-02-23 ENCOUNTER — Other Ambulatory Visit: Payer: Self-pay | Admitting: Internal Medicine

## 2015-03-12 ENCOUNTER — Other Ambulatory Visit: Payer: Self-pay | Admitting: Internal Medicine

## 2015-04-11 DIAGNOSIS — M1711 Unilateral primary osteoarthritis, right knee: Secondary | ICD-10-CM | POA: Diagnosis not present

## 2015-04-11 DIAGNOSIS — G894 Chronic pain syndrome: Secondary | ICD-10-CM | POA: Diagnosis not present

## 2015-04-11 DIAGNOSIS — M4155 Other secondary scoliosis, thoracolumbar region: Secondary | ICD-10-CM | POA: Diagnosis not present

## 2015-04-11 DIAGNOSIS — M47816 Spondylosis without myelopathy or radiculopathy, lumbar region: Secondary | ICD-10-CM | POA: Diagnosis not present

## 2015-04-22 ENCOUNTER — Other Ambulatory Visit: Payer: Self-pay

## 2015-04-22 MED ORDER — VALSARTAN-HYDROCHLOROTHIAZIDE 160-25 MG PO TABS: 1.0000 | ORAL_TABLET | Freq: Every day | ORAL | Status: DC

## 2015-04-22 MED ORDER — METOPROLOL TARTRATE 25 MG PO TABS: 25.0000 mg | ORAL_TABLET | Freq: Two times a day (BID) | ORAL | Status: DC

## 2015-04-30 ENCOUNTER — Other Ambulatory Visit: Payer: Self-pay

## 2015-04-30 MED ORDER — ESTRADIOL 0.5 MG PO TABS: 0.5000 mg | ORAL_TABLET | Freq: Every day | ORAL | Status: DC

## 2015-05-15 DIAGNOSIS — M4155 Other secondary scoliosis, thoracolumbar region: Secondary | ICD-10-CM | POA: Diagnosis not present

## 2015-05-15 DIAGNOSIS — Z79891 Long term (current) use of opiate analgesic: Secondary | ICD-10-CM | POA: Diagnosis not present

## 2015-05-15 DIAGNOSIS — M47816 Spondylosis without myelopathy or radiculopathy, lumbar region: Secondary | ICD-10-CM | POA: Diagnosis not present

## 2015-05-15 DIAGNOSIS — G894 Chronic pain syndrome: Secondary | ICD-10-CM | POA: Diagnosis not present

## 2015-05-16 ENCOUNTER — Other Ambulatory Visit: Payer: Self-pay

## 2015-05-16 DIAGNOSIS — Z1231 Encounter for screening mammogram for malignant neoplasm of breast: Secondary | ICD-10-CM

## 2015-05-25 ENCOUNTER — Telehealth: Payer: Self-pay

## 2015-05-25 NOTE — Telephone Encounter (Signed)
Refill faxed requesting metorpolol. MD denied. Pt needs an appt.

## 2015-05-31 ENCOUNTER — Other Ambulatory Visit: Payer: Self-pay

## 2015-05-31 ENCOUNTER — Other Ambulatory Visit: Payer: Self-pay | Admitting: Internal Medicine

## 2015-05-31 MED ORDER — SIMVASTATIN 10 MG PO TABS: 10.0000 mg | ORAL_TABLET | Freq: Every day | ORAL | Status: DC

## 2015-05-31 MED ORDER — LEVOTHYROXINE SODIUM 50 MCG PO TABS: ORAL_TABLET | ORAL | Status: DC

## 2015-06-04 ENCOUNTER — Ambulatory Visit (INDEPENDENT_AMBULATORY_CARE_PROVIDER_SITE_OTHER): Payer: Medicare Other | Admitting: Internal Medicine

## 2015-06-04 VITALS — BP 138/84 | HR 64 | Temp 98.1°F | Resp 18 | Ht 61.0 in | Wt 174.0 lb

## 2015-06-04 DIAGNOSIS — N959 Unspecified menopausal and perimenopausal disorder: Secondary | ICD-10-CM

## 2015-06-04 DIAGNOSIS — I10 Essential (primary) hypertension: Secondary | ICD-10-CM | POA: Diagnosis not present

## 2015-06-04 DIAGNOSIS — E785 Hyperlipidemia, unspecified: Secondary | ICD-10-CM | POA: Diagnosis not present

## 2015-06-04 DIAGNOSIS — M545 Low back pain, unspecified: Secondary | ICD-10-CM

## 2015-06-04 DIAGNOSIS — R609 Edema, unspecified: Secondary | ICD-10-CM

## 2015-06-04 DIAGNOSIS — Z23 Encounter for immunization: Secondary | ICD-10-CM | POA: Diagnosis not present

## 2015-06-04 DIAGNOSIS — Z0000 Encounter for general adult medical examination without abnormal findings: Secondary | ICD-10-CM

## 2015-06-04 DIAGNOSIS — N951 Menopausal and female climacteric states: Secondary | ICD-10-CM

## 2015-06-04 DIAGNOSIS — R1011 Right upper quadrant pain: Secondary | ICD-10-CM

## 2015-06-04 DIAGNOSIS — G8929 Other chronic pain: Secondary | ICD-10-CM | POA: Insufficient documentation

## 2015-06-04 DIAGNOSIS — E039 Hypothyroidism, unspecified: Secondary | ICD-10-CM | POA: Diagnosis not present

## 2015-06-04 DIAGNOSIS — R1010 Upper abdominal pain, unspecified: Secondary | ICD-10-CM

## 2015-06-04 MED ORDER — LEVOTHYROXINE SODIUM 50 MCG PO TABS: ORAL_TABLET | ORAL | Status: DC

## 2015-06-04 MED ORDER — NORTRIPTYLINE HCL 10 MG PO CAPS: 20.0000 mg | ORAL_CAPSULE | Freq: Every day | ORAL | Status: DC

## 2015-06-04 MED ORDER — SIMVASTATIN 10 MG PO TABS: 10.0000 mg | ORAL_TABLET | Freq: Every day | ORAL | Status: DC

## 2015-06-04 NOTE — Assessment & Plan Note (Signed)
Has been on hormone replacement for years due to severe vasomotor menopause symptoms Currently taking 0.5 mg estradiol daily Not following with GYN We'll discuss at her next visit reducing the dose further and possibly discontinuing

## 2015-06-04 NOTE — Assessment & Plan Note (Signed)
Blood pressure controlled Continue current medications at current doses

## 2015-06-04 NOTE — Patient Instructions (Addendum)
  We have reviewed your prior records including labs and tests today.  Test(s) ordered today. Your results will be released to [facility] (or called to you) after review, usually within 72hours after test completion. If any changes need to be made, you will be notified at that same time.  All other Health Maintenance issues reviewed.   All recommended immunizations and age-appropriate screenings are up-to-date.  Flu vaccine administered today.   Medications reviewed and updated.   Increase the nortriptyline to 20 mg nightly.     Your prescription(s) have been submitted to your pharmacy. Please take as directed and contact our office if you believe you are having problem(s) with the medication(s).   Please schedule followup in 6 months

## 2015-06-04 NOTE — Assessment & Plan Note (Signed)
Check lipid panel, CMP We'll adjust dose if needed

## 2015-06-04 NOTE — Progress Notes (Signed)
Subjective:    Patient ID: Valerie Mcclure, female    DOB: [birth year], 74 y.o.   MRN: [id number]  HPI She is here for a physical exam/wellness.    She still has pain in her stomach.  Her GB was removed and the surgeon told her the GB was chronically diseased.  She still has terrible pain under her rib area on both sides, but usually just the right side.  It was unclear when the pain was related to the gallbladder surgery or from thoracic back disease. She was placed on nortriplytline for nerve pain and it did help.  The pain is worse with standing, walking.  She follows with a pain specialist.    She denies any significant changes in her health since she was here last. She is taking all her medication daily as prescribed.    Diet: heart healthy Physical activity: sedentary due to chronic back pain Depression/mood screen: negative Hearing: intact to whispered voice Visual acuity: grossly normal, performs annual eye exam  ADLs: capable Fall risk: none Home safety: good Cognitive evaluation: intact to orientation, naming, recall and repetition EOL planning: adv directives discussed  I have personally reviewed and have noted 1.The patient's medical and social history - reviewed today no changes 2.Their use of alcohol, tobacco or illicit drugs 3.Their current medications and supplements 4.The patient's functional ability including ADL's, fall risks, home safety risks and hearing or visual impairment. 5.Diet and physical activities 6.Evidence for depression or mood disorders 7.Care team reviewed and updated (available in snapshot)     Medications and allergies reviewed with patient and updated today  Patient Active Problem List   Diagnosis Date Noted  . Postmenopausal disorder 04/03/2014  . Chronic low back pain   . PAIN IN THORACIC SPINE 04/18/2010  . DIARRHEA, PERSISTENT 01/31/2009  . CORONARY ARTERY  DISEASE 01/24/2009  . CHRONIC PAIN SYNDROME 01/23/2009  . DEGENERATIVE JOINT DISEASE 01/23/2009  . ARTHRITIS, LEFT KNEE 12/13/2008  . HERNIATED LUMBAR DISC 12/13/2008  . HYPOTHYROIDISM 11/21/2007  . HYPERLIPIDEMIA 11/09/2007  . ANEMIA 11/09/2007  . HYPERTENSION 11/09/2007  . OSTEOPENIA 11/09/2007  . UNS ADVRS EFF OTH RX MEDICINAL&BIOLOGICAL SBSTNC 11/09/2007    Current Outpatient Prescriptions on File Prior to Visit  Medication Sig Dispense Refill  . ALPRAZolam (XANAX) 0.25 MG tablet Take 0.25 mg by mouth at bedtime as needed. Rx by Dr. Myles Rosenthal     . Calcium Carbonate-Vit D-Min (CALCIUM 1200 PO) Take by mouth daily.      . Cholecalciferol (VITAMIN D3) 1000 UNITS CAPS Take by mouth daily.      Marland Kitchen doxycycline (VIBRA-TABS) 100 MG tablet Take 1 tablet (100 mg total) by mouth 2 (two) times daily. 20 tablet 0  . estradiol (ESTRACE) 0.5 MG tablet Take 1 tablet (0.5 mg total) by mouth daily. 30 tablet 11  . FLUoxetine (PROZAC) 20 MG capsule Take 1 capsule (20 mg total) by mouth daily. 30 capsule 11  . furosemide (LASIX) 20 MG tablet Take 1 tablet (20 mg total) by mouth daily as needed. 90 tablet 3  . L-Methylfolate-B6-B12 (METANX) 2.8-25-2 MG TABS Take by mouth daily. Take 2 at once every day     . levothyroxine (SYNTHROID, LEVOTHROID) 50 MCG tablet TAKE 1 TABLET BY MOUTH EVERY DAY BEFORE BREAKFAST 30 tablet 0  . metoprolol tartrate (LOPRESSOR) 25 MG tablet Take 1 tablet (25 mg total) by mouth 2 (two) times daily. 180 tablet 3  . morphine (KADIAN) 20 MG 24 hr capsule Take 1 capsule (  20 mg total) by mouth 2 (two) times daily. 60 capsule 0  . nortriptyline (PAMELOR) 10 MG capsule Take 1 capsule (10 mg total) by mouth at bedtime. 90 capsule 3  . predniSONE (DELTASONE) 10 MG tablet 1 tid pc 21 tablet 0  . simvastatin (ZOCOR) 10 MG tablet Take 1 tablet (10 mg total) by mouth daily. 30 tablet 0  . valsartan-hydrochlorothiazide (DIOVAN-HCT) 160-25 MG tablet Take 1 tablet by mouth daily. 180 tablet 3    No current facility-administered medications on file prior to visit.    Past Medical History  Diagnosis Date  . Unspecified hypothyroidism   . Chronic low back pain   . Unspecified essential hypertension   . Hyperlipidemia   . Osteopenia   . Blood transfusion without reported diagnosis     Past Surgical History  Procedure Laterality Date  . Abdominal hysterectomy  1970  . Thyroidectomy    . Lumbar laminectomy  11/2008    Done by Dr. Patrice Paradise  . Cholecystectomy  07/2009    Dr. Rise Patience  . Colonoscopy  2003  . Flexible sigmoidoscopy  2010  . Hand surgery      Social History   Social History  . Marital Status: Married    Spouse Name: N/A  . Number of Children: N/A  . Years of Education: N/A   Social History Main Topics  . Smoking status: Never Smoker   . Smokeless tobacco: Never Used  . Alcohol Use: No  . Drug Use: No  . Sexual Activity: Not on file   Other Topics Concern  . Not on file   Social History Narrative   Married    Review of Systems  Constitutional: Positive for unexpected weight change (weight gain). Negative for chills, appetite change and fatigue.  HENT: Negative for hearing loss.   Eyes: Negative for visual disturbance.  Respiratory: Negative for cough, shortness of breath and wheezing.   Cardiovascular: Positive for leg swelling. Negative for chest pain and palpitations.  Genitourinary: Negative for dysuria and hematuria.  Musculoskeletal: Positive for back pain and arthralgias.  Skin: Positive for rash (redness left arm, no itch or pain).  Neurological: Positive for dizziness (occ) and weakness (legs with walking - from back). Negative for light-headedness, numbness and headaches.       Objective:   Filed Vitals:   06/04/15 1613  BP: 138/84  Pulse: 64  Temp: 98.1 F (36.7 C)  Resp: 18   Filed Weights   06/04/15 1613  Weight: 174 lb (78.926 kg)   Body mass index is 32.89 kg/(m^2).   Physical Exam  Constitutional: She is  oriented to person, place, and time. She appears well-developed and well-nourished.  HENT:  Head: Normocephalic and atraumatic.  Right Ear: External ear normal.  Left Ear: External ear normal.  Mouth/Throat: Oropharynx is clear and moist.  Bilateral ear canals and tympanic membranes normal  Neck: Neck supple. No tracheal deviation present. No thyromegaly present.  No Carotid bruit  Cardiovascular: Normal rate, regular rhythm and normal heart sounds.   Pulmonary/Chest: Effort normal and breath sounds normal. No respiratory distress. She has no wheezes. She has no rales.  Abdominal: Soft. She exhibits no distension. There is no tenderness.  Musculoskeletal: She exhibits edema (Left> right-related to back surgery).  Lymphadenopathy:    She has no cervical adenopathy.  Neurological: She is oriented to person, place, and time.  Psychiatric: She has a normal mood and affect. Her behavior is normal.  Assessment & Plan:   Physical exam/wellness: Screening blood work ordered Colonoscopy up-to-date mammo up to date Not following with gyn-Estrace prescribed by prior PCP dexa up to date discussed vaccines, flu vaccine today Recommended pneumonia vaccine for shingles vaccine and a different date Eye exam up to date She has gained weight over the past few years and is not happy with her current weight She has not been able to exercise because of her chronic back pain. Encouraged any activity she is able to tolerate She states she does not eat much, but needs to monitor her portions  EKG - unchanged  See problem list for further assessment and plan   Follow up in 6 months

## 2015-06-04 NOTE — Assessment & Plan Note (Signed)
Chronic pain, pain is 5/10 Pain medication managed by pain management

## 2015-06-04 NOTE — Assessment & Plan Note (Signed)
We will check TSH and adjust medication dose if necessary

## 2015-06-04 NOTE — Assessment & Plan Note (Signed)
Related to prior gallbladder surgery or thoracic radiculopathy Taking nortriptyline, which has helped Will increase in troponin to 20 mg daily-discussed potential side effects

## 2015-06-04 NOTE — Progress Notes (Signed)
Pre visit review using our clinic review tool, if applicable. No additional management support is needed unless otherwise documented below in the visit note. 

## 2015-06-05 ENCOUNTER — Encounter: Payer: Self-pay | Admitting: Internal Medicine

## 2015-06-07 ENCOUNTER — Other Ambulatory Visit (INDEPENDENT_AMBULATORY_CARE_PROVIDER_SITE_OTHER): Payer: Medicare Other

## 2015-06-07 DIAGNOSIS — E039 Hypothyroidism, unspecified: Secondary | ICD-10-CM | POA: Diagnosis not present

## 2015-06-07 DIAGNOSIS — R7989 Other specified abnormal findings of blood chemistry: Secondary | ICD-10-CM | POA: Diagnosis not present

## 2015-06-07 DIAGNOSIS — Z0000 Encounter for general adult medical examination without abnormal findings: Secondary | ICD-10-CM

## 2015-06-07 DIAGNOSIS — E785 Hyperlipidemia, unspecified: Secondary | ICD-10-CM

## 2015-06-07 LAB — COMPREHENSIVE METABOLIC PANEL
ALBUMIN: 3.7 g/dL (ref 3.5–5.2)
ALK PHOS: 58 U/L (ref 39–117)
ALT: 10 U/L (ref 0–35)
AST: 17 U/L (ref 0–37)
BILIRUBIN TOTAL: 0.3 mg/dL (ref 0.2–1.2)
BUN: 26 mg/dL — ABNORMAL HIGH (ref 6–23)
CO2: 34 mEq/L — ABNORMAL HIGH (ref 19–32)
Calcium: 9.2 mg/dL (ref 8.4–10.5)
Chloride: 97 mEq/L (ref 96–112)
Creatinine, Ser: 1.45 mg/dL — ABNORMAL HIGH (ref 0.40–1.20)
GFR: 37.34 mL/min — AB (ref >60.00)
GLUCOSE: 100 mg/dL — AB (ref 70–99)
Potassium: 3.7 mEq/L (ref 3.5–5.1)
Sodium: 140 mEq/L (ref 135–145)
TOTAL PROTEIN: 6.8 g/dL (ref 6.0–8.3)

## 2015-06-07 LAB — LIPID PANEL
Cholesterol: 178 mg/dL (ref 0–200)
HDL: 38.4 mg/dL — ABNORMAL LOW (ref >39.00)
NONHDL: 139.9
TRIGLYCERIDES: 268 mg/dL — AB (ref 0.0–149.0)
Total CHOL/HDL Ratio: 5
VLDL: 53.6 mg/dL — ABNORMAL HIGH (ref 0.0–40.0)

## 2015-06-07 LAB — LDL CHOLESTEROL, DIRECT: Direct LDL: 76 mg/dL

## 2015-06-07 LAB — CBC WITH DIFFERENTIAL/PLATELET
BASOS PCT: 0.4 % (ref 0.0–3.0)
Basophils Absolute: 0 10*3/uL (ref 0.0–0.1)
EOS PCT: 2 % (ref 0.0–5.0)
Eosinophils Absolute: 0.1 10*3/uL (ref 0.0–0.7)
HCT: 36.4 % (ref 36.0–46.0)
HEMOGLOBIN: 12.1 g/dL (ref 12.0–15.0)
LYMPHS ABS: 1.7 10*3/uL (ref 0.7–4.0)
Lymphocytes Relative: 25.9 % (ref 12.0–46.0)
MCHC: 33.3 g/dL (ref 30.0–36.0)
MCV: 89.2 fl (ref 78.0–100.0)
MONO ABS: 0.4 10*3/uL (ref 0.1–1.0)
Monocytes Relative: 5.6 % (ref 3.0–12.0)
Neutro Abs: 4.5 10*3/uL (ref 1.4–7.7)
Neutrophils Relative %: 66.1 % (ref 43.0–77.0)
Platelets: 235 10*3/uL (ref 150.0–400.0)
RBC: 4.08 Mil/uL (ref 3.87–5.11)
RDW: 13.7 % (ref 11.5–15.5)
WBC: 6.7 10*3/uL (ref 4.0–10.5)

## 2015-06-07 LAB — HEMOGLOBIN A1C: HEMOGLOBIN A1C: 6.1 % (ref 4.6–6.5)

## 2015-06-07 LAB — TSH: TSH: 14.36 u[IU]/mL — AB (ref 0.35–4.50)

## 2015-06-10 ENCOUNTER — Telehealth: Payer: Self-pay | Admitting: Internal Medicine

## 2015-06-10 DIAGNOSIS — E119 Type 2 diabetes mellitus without complications: Secondary | ICD-10-CM | POA: Insufficient documentation

## 2015-06-10 DIAGNOSIS — E039 Hypothyroidism, unspecified: Secondary | ICD-10-CM

## 2015-06-10 DIAGNOSIS — N289 Disorder of kidney and ureter, unspecified: Secondary | ICD-10-CM

## 2015-06-10 DIAGNOSIS — R7303 Prediabetes: Secondary | ICD-10-CM | POA: Insufficient documentation

## 2015-06-10 MED ORDER — LEVOTHYROXINE SODIUM 75 MCG PO TABS: 75.0000 ug | ORAL_TABLET | Freq: Every day | ORAL | Status: DC

## 2015-06-10 NOTE — Telephone Encounter (Signed)
Her thyroid function is low and we need to increase the dose of her medication - new rx sent to pharmacy.  We need to recheck thyroid function in about 6 weeks.  Her LDL or bad cholesterol is well controlled, but her HDL or good cholesterol is low and her triglycerides are high -- increasing her exercise may help.   Her sugars are in the prediabetic range and she is at risk of developing diabetes. Ideally she should return for a visit in 6 months so we can recheck blood work and monitor this closely.  Routine blood counts are normal.  Her kidney function is decreased-I will also recheck this in approximately 6 weeks with her thyroid function. She should make sure she is drinking plenty of fluids.

## 2015-06-11 NOTE — Telephone Encounter (Signed)
LVM for pt to call back.

## 2015-06-12 DIAGNOSIS — Z79891 Long term (current) use of opiate analgesic: Secondary | ICD-10-CM | POA: Diagnosis not present

## 2015-06-12 DIAGNOSIS — M4155 Other secondary scoliosis, thoracolumbar region: Secondary | ICD-10-CM | POA: Diagnosis not present

## 2015-06-12 DIAGNOSIS — M47816 Spondylosis without myelopathy or radiculopathy, lumbar region: Secondary | ICD-10-CM | POA: Diagnosis not present

## 2015-06-12 DIAGNOSIS — G894 Chronic pain syndrome: Secondary | ICD-10-CM | POA: Diagnosis not present

## 2015-06-12 NOTE — Telephone Encounter (Signed)
Patient called in. Advised of below. Scheduled 6 month follow up. Advised of 6 week labs. She understood all of the notes.

## 2015-06-18 ENCOUNTER — Ambulatory Visit: Payer: Medicare Other

## 2015-07-17 DIAGNOSIS — G894 Chronic pain syndrome: Secondary | ICD-10-CM | POA: Diagnosis not present

## 2015-07-17 DIAGNOSIS — M4155 Other secondary scoliosis, thoracolumbar region: Secondary | ICD-10-CM | POA: Diagnosis not present

## 2015-07-17 DIAGNOSIS — Z79891 Long term (current) use of opiate analgesic: Secondary | ICD-10-CM | POA: Diagnosis not present

## 2015-07-17 DIAGNOSIS — M47816 Spondylosis without myelopathy or radiculopathy, lumbar region: Secondary | ICD-10-CM | POA: Diagnosis not present

## 2015-10-02 DIAGNOSIS — Z79891 Long term (current) use of opiate analgesic: Secondary | ICD-10-CM | POA: Diagnosis not present

## 2015-10-02 DIAGNOSIS — M4155 Other secondary scoliosis, thoracolumbar region: Secondary | ICD-10-CM | POA: Diagnosis not present

## 2015-10-02 DIAGNOSIS — M47816 Spondylosis without myelopathy or radiculopathy, lumbar region: Secondary | ICD-10-CM | POA: Diagnosis not present

## 2015-10-02 DIAGNOSIS — G894 Chronic pain syndrome: Secondary | ICD-10-CM | POA: Diagnosis not present

## 2015-12-10 ENCOUNTER — Ambulatory Visit: Payer: Medicare Other | Admitting: Internal Medicine

## 2015-12-25 ENCOUNTER — Other Ambulatory Visit (INDEPENDENT_AMBULATORY_CARE_PROVIDER_SITE_OTHER): Payer: Medicare Other

## 2015-12-25 ENCOUNTER — Encounter: Payer: Self-pay | Admitting: Internal Medicine

## 2015-12-25 ENCOUNTER — Ambulatory Visit (INDEPENDENT_AMBULATORY_CARE_PROVIDER_SITE_OTHER): Payer: Medicare Other | Admitting: Internal Medicine

## 2015-12-25 VITALS — BP 140/88 | HR 59 | Temp 98.6°F | Resp 16 | Wt 171.0 lb

## 2015-12-25 DIAGNOSIS — N289 Disorder of kidney and ureter, unspecified: Secondary | ICD-10-CM | POA: Diagnosis not present

## 2015-12-25 DIAGNOSIS — G894 Chronic pain syndrome: Secondary | ICD-10-CM | POA: Diagnosis not present

## 2015-12-25 DIAGNOSIS — N959 Unspecified menopausal and perimenopausal disorder: Secondary | ICD-10-CM

## 2015-12-25 DIAGNOSIS — G8929 Other chronic pain: Secondary | ICD-10-CM

## 2015-12-25 DIAGNOSIS — E038 Other specified hypothyroidism: Secondary | ICD-10-CM

## 2015-12-25 DIAGNOSIS — E785 Hyperlipidemia, unspecified: Secondary | ICD-10-CM | POA: Diagnosis not present

## 2015-12-25 DIAGNOSIS — I2510 Atherosclerotic heart disease of native coronary artery without angina pectoris: Secondary | ICD-10-CM | POA: Diagnosis not present

## 2015-12-25 DIAGNOSIS — E039 Hypothyroidism, unspecified: Secondary | ICD-10-CM

## 2015-12-25 DIAGNOSIS — N951 Menopausal and female climacteric states: Secondary | ICD-10-CM

## 2015-12-25 DIAGNOSIS — I340 Nonrheumatic mitral (valve) insufficiency: Secondary | ICD-10-CM | POA: Insufficient documentation

## 2015-12-25 DIAGNOSIS — R7303 Prediabetes: Secondary | ICD-10-CM

## 2015-12-25 DIAGNOSIS — M549 Dorsalgia, unspecified: Secondary | ICD-10-CM

## 2015-12-25 DIAGNOSIS — Z79891 Long term (current) use of opiate analgesic: Secondary | ICD-10-CM | POA: Diagnosis not present

## 2015-12-25 DIAGNOSIS — N189 Chronic kidney disease, unspecified: Secondary | ICD-10-CM

## 2015-12-25 DIAGNOSIS — I071 Rheumatic tricuspid insufficiency: Secondary | ICD-10-CM | POA: Insufficient documentation

## 2015-12-25 DIAGNOSIS — I10 Essential (primary) hypertension: Secondary | ICD-10-CM

## 2015-12-25 DIAGNOSIS — N1832 Chronic kidney disease, stage 3b: Secondary | ICD-10-CM | POA: Insufficient documentation

## 2015-12-25 DIAGNOSIS — M4155 Other secondary scoliosis, thoracolumbar region: Secondary | ICD-10-CM | POA: Diagnosis not present

## 2015-12-25 DIAGNOSIS — Z23 Encounter for immunization: Secondary | ICD-10-CM | POA: Diagnosis not present

## 2015-12-25 DIAGNOSIS — R1010 Upper abdominal pain, unspecified: Secondary | ICD-10-CM | POA: Diagnosis not present

## 2015-12-25 DIAGNOSIS — N184 Chronic kidney disease, stage 4 (severe): Secondary | ICD-10-CM | POA: Insufficient documentation

## 2015-12-25 DIAGNOSIS — M47816 Spondylosis without myelopathy or radiculopathy, lumbar region: Secondary | ICD-10-CM | POA: Diagnosis not present

## 2015-12-25 DIAGNOSIS — R1011 Right upper quadrant pain: Secondary | ICD-10-CM

## 2015-12-25 LAB — COMPREHENSIVE METABOLIC PANEL
ALBUMIN: 3.9 g/dL (ref 3.5–5.2)
ALK PHOS: 62 U/L (ref 39–117)
ALT: 14 U/L (ref 0–35)
AST: 18 U/L (ref 0–37)
BILIRUBIN TOTAL: 0.3 mg/dL (ref 0.2–1.2)
BUN: 49 mg/dL — ABNORMAL HIGH (ref 6–23)
CALCIUM: 10 mg/dL (ref 8.4–10.5)
CO2: 36 mEq/L — ABNORMAL HIGH (ref 19–32)
CREATININE: 1.51 mg/dL — AB (ref 0.40–1.20)
Chloride: 93 mEq/L — ABNORMAL LOW (ref 96–112)
GFR: 35.58 mL/min — AB (ref >60.00)
Glucose, Bld: 116 mg/dL — ABNORMAL HIGH (ref 70–99)
Potassium: 3.9 mEq/L (ref 3.5–5.1)
Sodium: 137 mEq/L (ref 135–145)
TOTAL PROTEIN: 7.4 g/dL (ref 6.0–8.3)

## 2015-12-25 LAB — GAMMA GT: GGT: 15 U/L (ref 7–51)

## 2015-12-25 LAB — CBC WITH DIFFERENTIAL/PLATELET
BASOS ABS: 0 10*3/uL (ref 0.0–0.1)
Basophils Relative: 0.4 % (ref 0.0–3.0)
EOS ABS: 0.1 10*3/uL (ref 0.0–0.7)
Eosinophils Relative: 1.7 % (ref 0.0–5.0)
HEMATOCRIT: 35.1 % — AB (ref 36.0–46.0)
HEMOGLOBIN: 11.9 g/dL — AB (ref 12.0–15.0)
LYMPHS PCT: 23.8 % (ref 12.0–46.0)
Lymphs Abs: 1.7 10*3/uL (ref 0.7–4.0)
MCHC: 33.9 g/dL (ref 30.0–36.0)
MCV: 86.5 fl (ref 78.0–100.0)
MONO ABS: 0.5 10*3/uL (ref 0.1–1.0)
Monocytes Relative: 6.5 % (ref 3.0–12.0)
Neutro Abs: 4.9 10*3/uL (ref 1.4–7.7)
Neutrophils Relative %: 67.6 % (ref 43.0–77.0)
PLATELETS: 286 10*3/uL (ref 150.0–400.0)
RBC: 4.05 Mil/uL (ref 3.87–5.11)
RDW: 13.8 % (ref 11.5–15.5)
WBC: 7.2 10*3/uL (ref 4.0–10.5)

## 2015-12-25 LAB — TSH: TSH: 5.24 u[IU]/mL — ABNORMAL HIGH (ref 0.35–4.50)

## 2015-12-25 LAB — HEMOGLOBIN A1C: Hgb A1c MFr Bld: 5.9 % (ref 4.6–6.5)

## 2015-12-25 MED ORDER — VALSARTAN 160 MG PO TABS: 160.0000 mg | ORAL_TABLET | Freq: Every day | ORAL | Status: DC

## 2015-12-25 MED ORDER — AMLODIPINE BESYLATE 5 MG PO TABS: 5.0000 mg | ORAL_TABLET | Freq: Every day | ORAL | Status: DC

## 2015-12-25 NOTE — Assessment & Plan Note (Signed)
Discontinue hydrochlorothiazide Continue furosemide  Every other day Continue Diovan for now Increase water intake We will refer to nephrology CMP today

## 2015-12-25 NOTE — Patient Instructions (Addendum)
Blood work and an back xray was ordered.  Test(s) ordered today. Your results will be released to [facility] (or called to you) after review, usually within 72hours after test completion. If any changes need to be made, you will be notified at that same time.  An Ultrasound of your abdomen was ordered - we will call you to schedule this.   All other Health Maintenance issues reviewed.   All recommended immunizations and age-appropriate screenings are up-to-date or discussed.  prevnar vaccine administered today.   Medications reviewed and updated.  Changes include stopping the valsartan-hydrochlorothiazide and starting plain valsartan 160 mg daily and amlodipine 5 mg daily  Your prescription(s) have been submitted to your pharmacy. Please take as directed and contact our office if you believe you are having problem(s) with the medication(s).  A referral was ordered for a kidney doctor  Please followup in 1 months

## 2015-12-25 NOTE — Assessment & Plan Note (Signed)
Check tsh  Titrate med dose if needed  

## 2015-12-25 NOTE — Assessment & Plan Note (Signed)
Discussed risks of medication at this age Will try slowly tapering off

## 2015-12-25 NOTE — Assessment & Plan Note (Signed)
?  Related to gallbladder surgery/liver related or thoracic radiculopathy Will continue nortriptyline at current dose Ultrasound of abdomen Thoracic x-ray Consider further evaluation of radiculopathy

## 2015-12-25 NOTE — Assessment & Plan Note (Signed)
Controlled, but will adjust medication given CKD D/c hctz Start amlodipine 5 mg daily Continue diovan 160 mg daily Continue metoprolol at current dose

## 2015-12-25 NOTE — Progress Notes (Signed)
Subjective:    Patient ID: Valerie Mcclure, female    DOB: 07-11-1941, 75 y.o.   MRN: [id number]  HPI She is here for follow up.  She complains of lack of energy.  We did adjust her thyroid dose after her last visit and I had asked her to have it recheck a few weeks later, which she did not do.    Hypertension: She is taking her medication daily. She is compliant with a low sodium diet.  She denies chest pain, palpitations, shortness of breath and regular headaches. She is not exercising regularly.  She does not monitor her blood pressure at home.    Hyperlipidemia: She is taking her medication daily. She is compliant with a low fat/cholesterol diet. She is not exercising regularly.    Prediabetes:  She is compliant with a low sugar/carbohydrate diet.  She is not exercising regularly due to her chronic pain.  Chronic lower back pain: following with pain management.    Chronic RUQ pain:  Related to prior GB surgery? - it started after her GB was removed.  It is worse with standing, making the bed.  Better with rest - staying in one position.  It is a burning or sharp pain.  It is constant with increased intensity at times. She is taking nortriptyline at night for this.  We increased this at her last visit and she states it helps sometimes.  She did have a thoracic mri several months after the pain started and she had DDD in the thoracic spine, but no pinched nerves.  She has not had any back or abdominal imaging recently.  She has noticed more recently pain wrapping around the left side.   Postmenopausal disorder: she is not following with gyn.  She has been on estradiol 0.5mg  daily for years.  She tried to taper off of it a while ago and had side effects.   CKD:  She does not drink much water during the day - maybe two glasses.  She drinks sweet tea for lunch and dinner.  She has 1-2 cups of coffee a day.  She does not take any nsaids.  She was taking lasix only as needed, but is now  taking it every other day.  She also takes hctz daily.  Her left leg is swollen due to her back surgery.    Leg edema:  She takes lasix every other day.  She is taking the diovan-hctz daily.    Medications and allergies reviewed with patient and updated if appropriate.  Patient Active Problem List   Diagnosis Date Noted  . Prediabetes 06/10/2015  . Edema 06/04/2015  . Abdominal pain, chronic, right upper quadrant 06/04/2015  . Postmenopausal disorder 04/03/2014  . Chronic low back pain   . PAIN IN THORACIC SPINE 04/18/2010  . DIARRHEA, PERSISTENT 01/31/2009  . CORONARY ARTERY DISEASE 01/24/2009  . CHRONIC PAIN SYNDROME 01/23/2009  . DEGENERATIVE JOINT DISEASE 01/23/2009  . ARTHRITIS, LEFT KNEE 12/13/2008  . HERNIATED LUMBAR DISC 12/13/2008  . Hypothyroidism 11/21/2007  . Hyperlipidemia 11/09/2007  . ANEMIA 11/09/2007  . Essential hypertension 11/09/2007  . OSTEOPENIA 11/09/2007  . UNS ADVRS EFF OTH RX MEDICINAL&BIOLOGICAL SBSTNC 11/09/2007    Current Outpatient Prescriptions on File Prior to Visit  Medication Sig Dispense Refill  . ALPRAZolam (XANAX) 0.25 MG tablet Take 0.25 mg by mouth at bedtime as needed. Rx by Dr. Myles Rosenthal     . Calcium Carbonate-Vit D-Min (CALCIUM 1200 PO) Take by mouth daily.      Marland Kitchen  Cholecalciferol (VITAMIN D3) 1000 UNITS CAPS Take by mouth daily.      Marland Kitchen estradiol (ESTRACE) 0.5 MG tablet Take 1 tablet (0.5 mg total) by mouth daily. 30 tablet 11  . FLUoxetine (PROZAC) 20 MG capsule Take 1 capsule (20 mg total) by mouth daily. 30 capsule 11  . furosemide (LASIX) 20 MG tablet Take 1 tablet (20 mg total) by mouth daily as needed. 90 tablet 3  . L-Methylfolate-B6-B12 (METANX) 2.8-25-2 MG TABS Take by mouth daily. Take 2 at once every day     . levothyroxine (SYNTHROID, LEVOTHROID) 75 MCG tablet Take 1 tablet (75 mcg total) by mouth daily. 90 tablet 3  . metoprolol tartrate (LOPRESSOR) 25 MG tablet Take 1 tablet (25 mg total) by mouth 2 (two) times daily. 180  tablet 3  . morphine (KADIAN) 20 MG 24 hr capsule Take 20 mg by mouth every 8 (eight) hours. 60 capsule 0  . nortriptyline (PAMELOR) 10 MG capsule Take 2 capsules (20 mg total) by mouth at bedtime. 180 capsule 3  . simvastatin (ZOCOR) 10 MG tablet Take 1 tablet (10 mg total) by mouth daily. 90 tablet 1  . valsartan-hydrochlorothiazide (DIOVAN-HCT) 160-25 MG tablet Take 1 tablet by mouth daily. 180 tablet 3   No current facility-administered medications on file prior to visit.    Past Medical History  Diagnosis Date  . Unspecified hypothyroidism   . Chronic low back pain   . Unspecified essential hypertension   . Hyperlipidemia   . Osteopenia   . Blood transfusion without reported diagnosis     Past Surgical History  Procedure Laterality Date  . Abdominal hysterectomy  1970  . Thyroidectomy    . Lumbar laminectomy  11/2008    Done by Dr. Patrice Paradise  . Cholecystectomy  07/2009    Dr. Rise Patience  . Colonoscopy  2003  . Flexible sigmoidoscopy  2010  . Hand surgery      Social History   Social History  . Marital Status: Married    Spouse Name: N/A  . Number of Children: N/A  . Years of Education: N/A   Social History Main Topics  . Smoking status: Never Smoker   . Smokeless tobacco: Never Used  . Alcohol Use: No  . Drug Use: No  . Sexual Activity: Not Asked   Other Topics Concern  . None   Social History Narrative   Married    Family History  Problem Relation Age of Onset  . Heart disease Father 26    MI age 65s  . Lung cancer Brother 33  . Colon cancer Neg Hx   . Esophageal cancer Neg Hx   . Rectal cancer Neg Hx   . Stomach cancer Neg Hx     Review of Systems  Constitutional: Positive for fever. Negative for chills and diaphoresis.  Respiratory: Positive for choking. Negative for cough, shortness of breath and wheezing.   Cardiovascular: Positive for leg swelling (left leg - from chronic back pain). Negative for chest pain and palpitations.  Gastrointestinal:  Positive for nausea (occasional) and abdominal pain. Negative for diarrhea, constipation and blood in stool.       No gerd  Musculoskeletal: Positive for back pain.  Neurological: Negative for dizziness, light-headedness and headaches.       Objective:   Filed Vitals:   12/25/15 0906  BP: 140/88  Pulse: 59  Temp: 98.6 F (37 C)  Resp: 16   Filed Weights   12/25/15 0906  Weight: 171 lb (77.565  kg)   Body mass index is 32.33 kg/(m^2).   Physical Exam  Constitutional: She appears well-developed and well-nourished. No distress.  HENT:  Head: Normocephalic and atraumatic.  Neck: Neck supple. No tracheal deviation present. No thyromegaly present.  Cardiovascular: Normal rate and regular rhythm.   Murmur heard. Pulmonary/Chest: Effort normal and breath sounds normal. No respiratory distress. She has no wheezes. She has no rales.  Abdominal: Soft. There is tenderness (ruq and right flank, mild luq). There is no rebound and no guarding.  Musculoskeletal: She exhibits edema (mild left leg > right leg - nonpitting).  Tenderness along thoracic spine, right flank and RUQ, mild tenderness in LUQ  Lymphadenopathy:    She has no cervical adenopathy.  Skin: Skin is warm and dry. She is not diaphoretic.  Psychiatric: She has a normal mood and affect. Her behavior is normal. Thought content normal.          Assessment & Plan:   See Problem List for Assessment and Plan of chronic medical problems.  F/u in 1 month

## 2015-12-25 NOTE — Assessment & Plan Note (Signed)
And lipid panel well controlled Continue simvastatin at current dose

## 2015-12-25 NOTE — Progress Notes (Signed)
Pre visit review using our clinic review tool, if applicable. No additional management support is needed unless otherwise documented below in the visit note. 

## 2015-12-25 NOTE — Assessment & Plan Note (Signed)
Check A1c. 

## 2015-12-27 LAB — PROTEIN ELECTROPHORESIS, SERUM
ALPHA-1-GLOBULIN: 0.4 g/dL — AB (ref 0.2–0.3)
ALPHA-2-GLOBULIN: 1 g/dL — AB (ref 0.5–0.9)
Albumin ELP: 3.8 g/dL (ref 3.8–4.8)
Beta 2: 0.6 g/dL — ABNORMAL HIGH (ref 0.2–0.5)
Beta Globulin: 0.5 g/dL (ref 0.4–0.6)
GAMMA GLOBULIN: 0.9 g/dL (ref 0.8–1.7)
Total Protein, Serum Electrophoresis: 7.2 g/dL (ref 6.1–8.1)

## 2015-12-30 ENCOUNTER — Telehealth: Payer: Self-pay | Admitting: Emergency Medicine

## 2015-12-30 DIAGNOSIS — E0590 Thyrotoxicosis, unspecified without thyrotoxic crisis or storm: Secondary | ICD-10-CM

## 2015-12-30 MED ORDER — LEVOTHYROXINE SODIUM 88 MCG PO TABS: 88.0000 ug | ORAL_TABLET | Freq: Every day | ORAL | Status: DC

## 2015-12-30 NOTE — Telephone Encounter (Signed)
-----   Message from [name], MD sent at 12/29/2015  9:27 AM EDT ----- Proteins in blood are normal.  Kidney function is slightly worse - the kidney doctor may change her blood pressure medication, but lets wait until she sees him/her.  Liver tests, blood counts are normal.   Sugars are in the prediabetic range - slightly better compared to 6 months ago. Her thyroid medication needs to be increased to 66mcg daily (please rx to [facility]).

## 2016-01-07 ENCOUNTER — Ambulatory Visit
Admission: RE | Admit: 2016-01-07 | Discharge: 2016-01-07 | Disposition: A | Discharge status: Home / Self Care | Payer: Medicare Other | Source: Ambulatory Visit | Attending: Internal Medicine | Admitting: Internal Medicine

## 2016-01-07 DIAGNOSIS — G8929 Other chronic pain: Secondary | ICD-10-CM

## 2016-01-07 DIAGNOSIS — R1011 Right upper quadrant pain: Principal | ICD-10-CM

## 2016-01-09 ENCOUNTER — Other Ambulatory Visit: Payer: Self-pay | Admitting: Internal Medicine

## 2016-01-09 ENCOUNTER — Encounter: Payer: Self-pay | Admitting: Internal Medicine

## 2016-01-09 DIAGNOSIS — K8689 Other specified diseases of pancreas: Secondary | ICD-10-CM | POA: Insufficient documentation

## 2016-01-10 ENCOUNTER — Telehealth: Payer: Self-pay | Admitting: Internal Medicine

## 2016-01-10 MED ORDER — DIPHENHYDRAMINE HCL 25 MG PO TABS: ORAL_TABLET | ORAL | Status: DC

## 2016-01-10 MED ORDER — PREDNISONE 20 MG PO TABS: ORAL_TABLET | ORAL | Status: DC

## 2016-01-10 NOTE — Telephone Encounter (Signed)
PT aware of the scan and all the directions. Will type up a letter as well with all the directions for pt to pick up. Also will you please send in prednisone to the [facility] on [address].

## 2016-01-10 NOTE — Telephone Encounter (Signed)
Prescriptions sent

## 2016-01-10 NOTE — Telephone Encounter (Signed)
Stacy from [facility] states due to pt's rash, she will need to take some pre meds. Before her CT Prednisone 50mg  1 dose 13 hrs prior 1 dose 7 hrs prior 1 dose 1 hr prior Also,  Benadryl 50mg  1 hour prior  Any questions stacy can be reached at [phone]

## 2016-01-10 NOTE — Telephone Encounter (Signed)
Also no solid foods 2 hrs prior

## 2016-01-10 NOTE — Telephone Encounter (Signed)
Pt's appt for her CT is Thurs 6/29 at 2:00 pm. She needs to be there at 1:15 to drink water before her scan.  Its being done at the [facility]. [place], [place]  If she needs to reschedule have her call Marzetta Board at [phone]  Thank you!

## 2016-01-12 ENCOUNTER — Other Ambulatory Visit: Payer: Self-pay | Admitting: Internal Medicine

## 2016-01-16 ENCOUNTER — Ambulatory Visit (INDEPENDENT_AMBULATORY_CARE_PROVIDER_SITE_OTHER)
Admission: RE | Admit: 2016-01-16 | Discharge: 2016-01-16 | Disposition: A | Discharge status: Home / Self Care | Payer: Medicare Other | Source: Ambulatory Visit | Attending: Internal Medicine | Admitting: Internal Medicine

## 2016-01-16 DIAGNOSIS — R109 Unspecified abdominal pain: Secondary | ICD-10-CM | POA: Diagnosis not present

## 2016-01-16 DIAGNOSIS — K8689 Other specified diseases of pancreas: Secondary | ICD-10-CM | POA: Diagnosis not present

## 2016-01-16 MED ORDER — IOPAMIDOL (ISOVUE-300) INJECTION 61%
100.0000 mL | Freq: Once | INTRAVENOUS | Status: AC | PRN
  Administered 2016-01-16: 70 mL via INTRAVENOUS

## 2016-01-29 ENCOUNTER — Ambulatory Visit (INDEPENDENT_AMBULATORY_CARE_PROVIDER_SITE_OTHER): Payer: Medicare Other | Admitting: Internal Medicine

## 2016-01-29 ENCOUNTER — Encounter: Payer: Self-pay | Admitting: Internal Medicine

## 2016-01-29 VITALS — BP 104/56 | HR 60 | Temp 98.7°F | Resp 18 | Ht 61.0 in | Wt 176.0 lb

## 2016-01-29 DIAGNOSIS — K8689 Other specified diseases of pancreas: Secondary | ICD-10-CM

## 2016-01-29 DIAGNOSIS — G8929 Other chronic pain: Secondary | ICD-10-CM

## 2016-01-29 DIAGNOSIS — R1011 Right upper quadrant pain: Secondary | ICD-10-CM

## 2016-01-29 DIAGNOSIS — I10 Essential (primary) hypertension: Secondary | ICD-10-CM | POA: Diagnosis not present

## 2016-01-29 DIAGNOSIS — R1010 Upper abdominal pain, unspecified: Secondary | ICD-10-CM

## 2016-01-29 DIAGNOSIS — E038 Other specified hypothyroidism: Secondary | ICD-10-CM | POA: Diagnosis not present

## 2016-01-29 DIAGNOSIS — N189 Chronic kidney disease, unspecified: Secondary | ICD-10-CM

## 2016-01-29 MED ORDER — AMLODIPINE BESYLATE 5 MG PO TABS: 2.5000 mg | ORAL_TABLET | Freq: Every day | ORAL | Status: DC

## 2016-01-29 NOTE — Progress Notes (Signed)
Subjective:    Patient ID: Valerie Mcclure, female    DOB: Oct 08, 1940, 75 y.o.   MRN: [id number]  HPI She is here for follow up.  Hypothyroidism: We recently adjusted her thyroid medication - we increased her dose one month ago. She feels better on the higher dose of levothyroxine.   Hypertension, CKD:  Her BP was controlled one month ago but I adjusted her medication due to her CKD.  hctz was discontinued, amlodipine was added at 5 mg and she was continued on diovan 160 mg daily.  She takes lasix every other day and she did take it today.  I asked her to increase her water intake and she was referred to nephrology, who she will see in the early fall.  She has increased swelling in her legs since changing the medication.    Right sided abdominal pain: an [place] of her abdomen showed mild prominence of the pancreatic duct and moderate prominence of the common bile duct - these are likely related to s/p cholecystectomy.  She still has the pain, but for some reason it has been much better.    Medications and allergies reviewed with patient and updated if appropriate.  Patient Active Problem List   Diagnosis Date Noted  . Pancreatic duct dilated ([place]) 01/09/2016  . CKD (chronic kidney disease) 12/25/2015  . Moderate tricuspid regurgitation 12/25/2015  . Mild mitral regurgitation 12/25/2015  . Prediabetes 06/10/2015  . Edema 06/04/2015  . Abdominal pain, chronic, right upper quadrant 06/04/2015  . Postmenopausal disorder 04/03/2014  . Chronic low back pain   . PAIN IN THORACIC SPINE 04/18/2010  . DIARRHEA, PERSISTENT 01/31/2009  . Coronary atherosclerosis 01/24/2009  . CHRONIC PAIN SYNDROME 01/23/2009  . DEGENERATIVE JOINT DISEASE 01/23/2009  . ARTHRITIS, LEFT KNEE 12/13/2008  . HERNIATED LUMBAR DISC 12/13/2008  . Hypothyroidism 11/21/2007  . Hyperlipidemia 11/09/2007  . ANEMIA 11/09/2007  . Essential hypertension 11/09/2007  . OSTEOPENIA 11/09/2007    Current Outpatient  Prescriptions on File Prior to Visit  Medication Sig Dispense Refill  . ALPRAZolam (XANAX) 0.5 MG tablet TK 1 T PO Q 8 H PRN  2  . amLODipine (NORVASC) 5 MG tablet Take 1 tablet (5 mg total) by mouth daily. 90 tablet 3  . Calcium Carbonate-Vit D-Min (CALCIUM 1200 PO) Take by mouth daily.      . Cholecalciferol (VITAMIN D3) 1000 UNITS CAPS Take by mouth daily.      . diphenhydrAMINE (BENADRYL) 25 MG tablet Take 50 mg 1 hour prior to Ct scan 2 tablet 0  . estradiol (ESTRACE) 0.5 MG tablet Take 1 tablet (0.5 mg total) by mouth daily. 30 tablet 11  . FLUoxetine (PROZAC) 20 MG capsule Take 1 capsule (20 mg total) by mouth daily. 30 capsule 11  . furosemide (LASIX) 20 MG tablet Take 1 tablet (20 mg total) by mouth daily as needed. 90 tablet 3  . L-Methylfolate-B6-B12 (METANX) 2.8-25-2 MG TABS Take by mouth daily. Take 2 at once every day     . levothyroxine (SYNTHROID, LEVOTHROID) 75 MCG tablet Take 1 tablet (75 mcg total) by mouth daily. 90 tablet 3  . levothyroxine (SYNTHROID, LEVOTHROID) 88 MCG tablet Take 1 tablet (88 mcg total) by mouth daily. 90 tablet 1  . metoprolol tartrate (LOPRESSOR) 25 MG tablet Take 1 tablet (25 mg total) by mouth 2 (two) times daily. 180 tablet 3  . morphine (MS CONTIN) 30 MG 12 hr tablet TK 1 T PO  Q 8 H  0  .  nortriptyline (PAMELOR) 10 MG capsule Take 2 capsules (20 mg total) by mouth at bedtime. 180 capsule 3  . predniSONE (DELTASONE) 20 MG tablet Take 50 mg by mouth 13 hrs prior to Ct scan, 1 dose 7 hrs prior to Ct scan and 1 dose 1 hr prior to Ct scan 8 tablet 0  . simvastatin (ZOCOR) 10 MG tablet TAKE 1 TABLET(10 MG) BY MOUTH DAILY 90 tablet 2  . valsartan (DIOVAN) 160 MG tablet Take 1 tablet (160 mg total) by mouth daily. 90 tablet 3   No current facility-administered medications on file prior to visit.    Past Medical History  Diagnosis Date  . Unspecified hypothyroidism   . Chronic low back pain   . Unspecified essential hypertension   . Hyperlipidemia     . Osteopenia   . Blood transfusion without reported diagnosis     Past Surgical History  Procedure Laterality Date  . Abdominal hysterectomy  1970  . Thyroidectomy    . Lumbar laminectomy  11/2008    Done by Dr. Patrice Paradise  . Cholecystectomy  07/2009    Dr. Rise Patience  . Colonoscopy  2003  . Flexible sigmoidoscopy  2010  . Hand surgery      Social History   Social History  . Marital Status: Married    Spouse Name: N/A  . Number of Children: N/A  . Years of Education: N/A   Social History Main Topics  . Smoking status: Never Smoker   . Smokeless tobacco: Never Used  . Alcohol Use: No  . Drug Use: No  . Sexual Activity: Not on file   Other Topics Concern  . Not on file   Social History Narrative   Married    Family History  Problem Relation Age of Onset  . Heart disease Father 63    MI age 18s  . Lung cancer Brother 72  . Colon cancer Neg Hx   . Esophageal cancer Neg Hx   . Rectal cancer Neg Hx   . Stomach cancer Neg Hx     Review of Systems  Constitutional: Negative for fever.       Energy level better with increased dose of thyroid med  Respiratory: Positive for shortness of breath (with exertion - chronic, partly due to back pain). Negative for cough and wheezing.   Cardiovascular: Positive for leg swelling. Negative for chest pain and palpitations.  Neurological: Negative for dizziness, light-headedness and headaches.       Objective:   Filed Vitals:   01/29/16 0925  BP: 104/56  Pulse: 60  Temp: 98.7 F (37.1 C)  Resp: 18   Filed Weights   01/29/16 0925  Weight: 176 lb (79.833 kg)   Body mass index is 33.27 kg/(m^2).   Physical Exam Constitutional: Appears well-developed and well-nourished. No distress.  Neck: Neck supple. No tracheal deviation present. No thyromegaly present.  No carotid bruit. No cervical adenopathy.   Cardiovascular: Normal rate, regular rhythm and normal heart sounds.   2/6  murmur heard.  1 + non-pitting  edema Pulmonary/Chest: Effort normal and breath sounds normal. No respiratory distress. No wheezes.        Assessment & Plan:   See Problem List for Assessment and Plan of chronic medical problems.

## 2016-01-29 NOTE — Progress Notes (Signed)
Pre visit review using our clinic review tool, if applicable. No additional management support is needed unless otherwise documented below in the visit note. 

## 2016-01-29 NOTE — Assessment & Plan Note (Signed)
Will see nephrology in fall BP well controlled Avoid nsaids hctz discontinued

## 2016-01-29 NOTE — Assessment & Plan Note (Signed)
BP very good here today, does not monitor at home Given increased leg swelling - likely from discontinuing hctz and adding amlodipine 5 mg daily -- will try decreasing amlodipine to 2.5 mg daily Uses compression stockings in the fall-winter only

## 2016-01-29 NOTE — Assessment & Plan Note (Signed)
Pain slightly improved Continue nortriptyline

## 2016-01-29 NOTE — Assessment & Plan Note (Signed)
Dose recently increased - feeling better Recheck tsh at next visit

## 2016-01-29 NOTE — Patient Instructions (Signed)
   Medications reviewed and updated.  Changes include the amlodipine to 2.5 mg daily.    Please followup in 6 months

## 2016-02-24 DIAGNOSIS — I129 Hypertensive chronic kidney disease with stage 1 through stage 4 chronic kidney disease, or unspecified chronic kidney disease: Secondary | ICD-10-CM | POA: Diagnosis not present

## 2016-02-24 DIAGNOSIS — N189 Chronic kidney disease, unspecified: Secondary | ICD-10-CM | POA: Diagnosis not present

## 2016-02-24 DIAGNOSIS — R7303 Prediabetes: Secondary | ICD-10-CM | POA: Diagnosis not present

## 2016-02-24 DIAGNOSIS — N184 Chronic kidney disease, stage 4 (severe): Secondary | ICD-10-CM | POA: Diagnosis not present

## 2016-02-25 ENCOUNTER — Other Ambulatory Visit: Payer: Self-pay | Admitting: Nephrology

## 2016-02-25 DIAGNOSIS — N184 Chronic kidney disease, stage 4 (severe): Secondary | ICD-10-CM

## 2016-03-09 ENCOUNTER — Other Ambulatory Visit: Payer: Medicare Other

## 2016-03-11 ENCOUNTER — Other Ambulatory Visit: Payer: Medicare Other

## 2016-03-11 DIAGNOSIS — G894 Chronic pain syndrome: Secondary | ICD-10-CM | POA: Diagnosis not present

## 2016-03-11 DIAGNOSIS — Z79891 Long term (current) use of opiate analgesic: Secondary | ICD-10-CM | POA: Diagnosis not present

## 2016-03-11 DIAGNOSIS — M4155 Other secondary scoliosis, thoracolumbar region: Secondary | ICD-10-CM | POA: Diagnosis not present

## 2016-03-11 DIAGNOSIS — M47816 Spondylosis without myelopathy or radiculopathy, lumbar region: Secondary | ICD-10-CM | POA: Diagnosis not present

## 2016-03-16 ENCOUNTER — Ambulatory Visit
Admission: RE | Admit: 2016-03-16 | Discharge: 2016-03-16 | Disposition: A | Discharge status: Home / Self Care | Payer: Medicare Other | Source: Ambulatory Visit | Attending: Nephrology | Admitting: Nephrology

## 2016-03-16 DIAGNOSIS — N184 Chronic kidney disease, stage 4 (severe): Secondary | ICD-10-CM

## 2016-03-26 ENCOUNTER — Telehealth: Payer: Self-pay | Admitting: Internal Medicine

## 2016-03-26 NOTE — Telephone Encounter (Signed)
Pt called in and said that she has not heard anything from her ultrasound that she had done Monday.  Do you have the results back ?

## 2016-04-07 ENCOUNTER — Other Ambulatory Visit (INDEPENDENT_AMBULATORY_CARE_PROVIDER_SITE_OTHER): Payer: Medicare Other

## 2016-04-07 ENCOUNTER — Ambulatory Visit (INDEPENDENT_AMBULATORY_CARE_PROVIDER_SITE_OTHER): Payer: Medicare Other | Admitting: Internal Medicine

## 2016-04-07 ENCOUNTER — Encounter: Payer: Self-pay | Admitting: Internal Medicine

## 2016-04-07 VITALS — BP 134/84 | HR 71 | Temp 98.3°F | Resp 16 | Wt 174.0 lb

## 2016-04-07 DIAGNOSIS — E038 Other specified hypothyroidism: Secondary | ICD-10-CM

## 2016-04-07 DIAGNOSIS — I10 Essential (primary) hypertension: Secondary | ICD-10-CM | POA: Diagnosis not present

## 2016-04-07 DIAGNOSIS — R7303 Prediabetes: Secondary | ICD-10-CM

## 2016-04-07 DIAGNOSIS — Z23 Encounter for immunization: Secondary | ICD-10-CM

## 2016-04-07 DIAGNOSIS — I071 Rheumatic tricuspid insufficiency: Secondary | ICD-10-CM | POA: Diagnosis not present

## 2016-04-07 DIAGNOSIS — R600 Localized edema: Secondary | ICD-10-CM

## 2016-04-07 DIAGNOSIS — N189 Chronic kidney disease, unspecified: Secondary | ICD-10-CM

## 2016-04-07 DIAGNOSIS — R609 Edema, unspecified: Secondary | ICD-10-CM

## 2016-04-07 LAB — CBC WITH DIFFERENTIAL/PLATELET
BASOS PCT: 0.4 % (ref 0.0–3.0)
Basophils Absolute: 0 10*3/uL (ref 0.0–0.1)
EOS ABS: 0.1 10*3/uL (ref 0.0–0.7)
Eosinophils Relative: 0.9 % (ref 0.0–5.0)
HCT: 38 % (ref 36.0–46.0)
HEMOGLOBIN: 12.8 g/dL (ref 12.0–15.0)
LYMPHS ABS: 2.4 10*3/uL (ref 0.7–4.0)
Lymphocytes Relative: 22.4 % (ref 12.0–46.0)
MCHC: 33.6 g/dL (ref 30.0–36.0)
MCV: 86.7 fl (ref 78.0–100.0)
MONO ABS: 0.6 10*3/uL (ref 0.1–1.0)
Monocytes Relative: 5.8 % (ref 3.0–12.0)
NEUTROS PCT: 70.5 % (ref 43.0–77.0)
Neutro Abs: 7.5 10*3/uL (ref 1.4–7.7)
PLATELETS: 289 10*3/uL (ref 150.0–400.0)
RBC: 4.38 Mil/uL (ref 3.87–5.11)
RDW: 14.1 % (ref 11.5–15.5)
WBC: 10.6 10*3/uL — AB (ref 4.0–10.5)

## 2016-04-07 LAB — COMPREHENSIVE METABOLIC PANEL
ALBUMIN: 4.2 g/dL (ref 3.5–5.2)
ALK PHOS: 78 U/L (ref 39–117)
ALT: 13 U/L (ref 0–35)
AST: 17 U/L (ref 0–37)
BUN: 28 mg/dL — AB (ref 6–23)
CHLORIDE: 98 meq/L (ref 96–112)
CO2: 35 mEq/L — ABNORMAL HIGH (ref 19–32)
CREATININE: 1.64 mg/dL — AB (ref 0.40–1.20)
Calcium: 9.2 mg/dL (ref 8.4–10.5)
GFR: 32.32 mL/min — ABNORMAL LOW (ref >60.00)
Glucose, Bld: 92 mg/dL (ref 70–99)
Potassium: 4.1 mEq/L (ref 3.5–5.1)
SODIUM: 138 meq/L (ref 135–145)
TOTAL PROTEIN: 7.5 g/dL (ref 6.0–8.3)
Total Bilirubin: 0.4 mg/dL (ref 0.2–1.2)

## 2016-04-07 LAB — TSH: TSH: 2.79 u[IU]/mL (ref 0.35–4.50)

## 2016-04-07 LAB — HEMOGLOBIN A1C: HEMOGLOBIN A1C: 5.8 % (ref 4.6–6.5)

## 2016-04-07 MED ORDER — FUROSEMIDE 20 MG PO TABS: 20.0000 mg | ORAL_TABLET | Freq: Every day | ORAL | 3 refills | Status: DC

## 2016-04-07 NOTE — Assessment & Plan Note (Signed)
Controlled, but increased edema with amlodipine 2.5 mg daily - will d/c continue other medications Will not increase any medication at this time Follow up in 2 weeks cmp today

## 2016-04-07 NOTE — Assessment & Plan Note (Signed)
a1c

## 2016-04-07 NOTE — Patient Instructions (Addendum)
[facility] [place], [place]  [address] [phone]   Test(s) ordered today. Your results will be released to [place] (or called to you) after review, usually within 72hours after test completion. If any changes need to be made, you will be notified at that same time.   Flu vaccines administered today.   Medications reviewed and updated.  Changes include stopping the amlodipine.   A Echo or ultrasound of your heart was ordered - you will be called to schedule that.   Please followup in 2 weeks

## 2016-04-07 NOTE — Assessment & Plan Note (Signed)
Check tsh  Titrate med dose if needed  

## 2016-04-07 NOTE — Assessment & Plan Note (Signed)
Edema - chronic, getting worse cmp today Will see nephrology Check echo Stop amlodipine - this made the edema worse Elevation, low sodium diet Consider compression socks Will consider transient increase in lasix

## 2016-04-07 NOTE — Assessment & Plan Note (Signed)
Check echo 

## 2016-04-07 NOTE — Assessment & Plan Note (Signed)
Referred to nephrology  - has not seen them yet, but has had a renal US - CKD evident She will call them to follow up on her appt cmp today

## 2016-04-07 NOTE — Progress Notes (Signed)
Subjective:    Patient ID: Valerie Mcclure, female    DOB: Jan 31, 1941, 75 y.o.   MRN: [id number]  HPI She is here for an acute visit.   Feet swelling:  She has had foot and leg swelling for months, but it has gotten worse since changing her BP medication a couple of months ago.  She takes the lasix daily.  The swelling gets worse during the day.  She is complaint with a low sodium diet.  She denies worsening sob.  She is limited in her exercise due to SOB and back pain.   Hypertension: She is taking her medication daily. She is compliant with a low sodium diet.  She is not exercising regularly.  She does not monitor her blood pressure at home.    She has been trying to lose weight but has not been able to.    Medications and allergies reviewed with patient and updated if appropriate.  Patient Active Problem List   Diagnosis Date Noted  . Pancreatic duct dilated ([place]) 01/09/2016  . CKD (chronic kidney disease) 12/25/2015  . Moderate tricuspid regurgitation 12/25/2015  . Mild mitral regurgitation 12/25/2015  . Prediabetes 06/10/2015  . Edema 06/04/2015  . Abdominal pain, chronic, right upper quadrant 06/04/2015  . Postmenopausal disorder 04/03/2014  . Chronic low back pain   . PAIN IN THORACIC SPINE 04/18/2010  . DIARRHEA, PERSISTENT 01/31/2009  . Coronary atherosclerosis 01/24/2009  . CHRONIC PAIN SYNDROME 01/23/2009  . DEGENERATIVE JOINT DISEASE 01/23/2009  . ARTHRITIS, LEFT KNEE 12/13/2008  . HERNIATED LUMBAR DISC 12/13/2008  . Hypothyroidism 11/21/2007  . Hyperlipidemia 11/09/2007  . ANEMIA 11/09/2007  . Essential hypertension 11/09/2007  . OSTEOPENIA 11/09/2007    Current Outpatient Prescriptions on File Prior to Visit  Medication Sig Dispense Refill  . ALPRAZolam (XANAX) 0.5 MG tablet TK 1 T PO Q 8 H PRN  2  . amLODipine (NORVASC) 5 MG tablet Take 0.5 tablets (2.5 mg total) by mouth daily. 90 tablet 3  . Calcium Carbonate-Vit D-Min (CALCIUM 1200 PO) Take by  mouth daily.      . Cholecalciferol (VITAMIN D3) 1000 UNITS CAPS Take by mouth daily.      . diphenhydrAMINE (BENADRYL) 25 MG tablet Take 50 mg 1 hour prior to Ct scan 2 tablet 0  . estradiol (ESTRACE) 0.5 MG tablet Take 1 tablet (0.5 mg total) by mouth daily. 30 tablet 11  . FLUoxetine (PROZAC) 20 MG capsule Take 1 capsule (20 mg total) by mouth daily. 30 capsule 11  . furosemide (LASIX) 20 MG tablet Take 1 tablet (20 mg total) by mouth daily as needed. 90 tablet 3  . levothyroxine (SYNTHROID, LEVOTHROID) 88 MCG tablet Take 1 tablet (88 mcg total) by mouth daily. 90 tablet 1  . metoprolol tartrate (LOPRESSOR) 25 MG tablet Take 1 tablet (25 mg total) by mouth 2 (two) times daily. 180 tablet 3  . morphine (MS CONTIN) 30 MG 12 hr tablet TK 1 T PO  Q 8 H  0  . nortriptyline (PAMELOR) 10 MG capsule Take 2 capsules (20 mg total) by mouth at bedtime. 180 capsule 3  . predniSONE (DELTASONE) 20 MG tablet Take 50 mg by mouth 13 hrs prior to Ct scan, 1 dose 7 hrs prior to Ct scan and 1 dose 1 hr prior to Ct scan 8 tablet 0  . simvastatin (ZOCOR) 10 MG tablet TAKE 1 TABLET(10 MG) BY MOUTH DAILY 90 tablet 2  . valsartan (DIOVAN) 160 MG tablet Take  1 tablet (160 mg total) by mouth daily. 90 tablet 3   No current facility-administered medications on file prior to visit.     Past Medical History:  Diagnosis Date  . Blood transfusion without reported diagnosis   . Chronic low back pain   . Hyperlipidemia   . Osteopenia   . Unspecified essential hypertension   . Unspecified hypothyroidism     Past Surgical History:  Procedure Laterality Date  . ABDOMINAL HYSTERECTOMY  1970  . CHOLECYSTECTOMY  07/2009   Dr. Rise Patience  . COLONOSCOPY  2003  . FLEXIBLE SIGMOIDOSCOPY  2010  . HAND SURGERY    . LUMBAR LAMINECTOMY  11/2008   Done by Dr. Patrice Paradise  . THYROIDECTOMY      Social History   Social History  . Marital status: Married    Spouse name: N/A  . Number of children: N/A  . Years of education: N/A     Social History Main Topics  . Smoking status: Never Smoker  . Smokeless tobacco: Never Used  . Alcohol use No  . Drug use: No  . Sexual activity: Not on file   Other Topics Concern  . Not on file   Social History Narrative   Married    Family History  Problem Relation Age of Onset  . Heart disease Father 19    MI age 43s  . Lung cancer Brother 53  . Colon cancer Neg Hx   . Esophageal cancer Neg Hx   . Rectal cancer Neg Hx   . Stomach cancer Neg Hx     Review of Systems  Constitutional: Negative for fever.  Respiratory: Positive for shortness of breath.   Cardiovascular: Positive for leg swelling. Negative for chest pain and palpitations.  Gastrointestinal: Negative for abdominal pain.  Musculoskeletal: Positive for back pain (limits stamina).  Skin: Positive for color change (intermittent redness in feet and legs). Negative for rash.  Neurological: Negative for light-headedness and headaches.       Objective:   Vitals:   04/07/16 1441  BP: 134/84  Pulse: 71  Resp: 16  Temp: 98.3 F (36.8 C)   Filed Weights   04/07/16 1441  Weight: 174 lb (78.9 kg)   Body mass index is 32.88 kg/m.   Physical Exam Constitutional: Appears well-developed and well-nourished. No distress.  HENT:  Head: Normocephalic and atraumatic.  Neck: Neck supple. No tracheal deviation present. No thyromegaly present.  Cardiovascular: Normal rate, regular rhythm and normal heart sounds.   2/6 systolic murmur heard.  No carotid bruit  Pulmonary/Chest: Effort normal and breath sounds normal. No respiratory distress. No has no wheezes. No rales.  Musculoskeletal: 2 + pitting edema b/l LE.  Lymphadenopathy: No cervical adenopathy.  Skin: Skin is warm and dry. Not diaphoretic. No LE erythema Psychiatric: Normal mood and affect. Behavior is normal.       Assessment & Plan:   See Problem List for Assessment and Plan of chronic medical problems.   Follow up in 2 weeks

## 2016-04-07 NOTE — Progress Notes (Signed)
Pre visit review using our clinic review tool, if applicable. No additional management support is needed unless otherwise documented below in the visit note. 

## 2016-04-09 ENCOUNTER — Telehealth: Payer: Self-pay | Admitting: Internal Medicine

## 2016-04-09 NOTE — Telephone Encounter (Signed)
Spoke with pt to inform labs were not released yet. Please send lab results to me so I can call when resulted.

## 2016-04-09 NOTE — Telephone Encounter (Signed)
Pt request test result. Please call her back

## 2016-04-09 NOTE — Telephone Encounter (Signed)
Results released. Please call her - see result note.

## 2016-04-10 NOTE — Telephone Encounter (Signed)
Spoke with pt to inform of labs

## 2016-04-21 ENCOUNTER — Ambulatory Visit (INDEPENDENT_AMBULATORY_CARE_PROVIDER_SITE_OTHER): Payer: Medicare Other | Admitting: Internal Medicine

## 2016-04-21 ENCOUNTER — Encounter: Payer: Self-pay | Admitting: Internal Medicine

## 2016-04-21 VITALS — BP 126/76 | HR 73 | Temp 98.9°F | Resp 16 | Wt 165.0 lb

## 2016-04-21 DIAGNOSIS — I10 Essential (primary) hypertension: Secondary | ICD-10-CM | POA: Diagnosis not present

## 2016-04-21 DIAGNOSIS — N189 Chronic kidney disease, unspecified: Secondary | ICD-10-CM | POA: Diagnosis not present

## 2016-04-21 DIAGNOSIS — R600 Localized edema: Secondary | ICD-10-CM

## 2016-04-21 MED ORDER — ESTRADIOL 0.5 MG PO TABS: 0.2500 mg | ORAL_TABLET | Freq: Every day | ORAL | 11 refills | Status: DC

## 2016-04-21 NOTE — Assessment & Plan Note (Signed)
Improved after discontinuing amlodipine 2.5 mg daily, revision of her diet and weight loss She is taking 20 mg of Lasix daily She denies any edema Advised her to try alternating 20 mg of Lasix daily with 10 mg daily-if she does okay on the 10 mg daily to reduce it to 10 mg daily, which may help her kidney function

## 2016-04-21 NOTE — Assessment & Plan Note (Signed)
Renal ultrasound that showed medical kidney disease Was referred to [facility], but has not seen him. She will call today to schedule an appointment

## 2016-04-21 NOTE — Patient Instructions (Addendum)
Call kidney doctor to schedule an appointment:  [facility] [place], [place]  [address] [phone]     No immunizations administered today.   Medications reviewed and updated.   No changes recommended at this time.

## 2016-04-21 NOTE — Progress Notes (Signed)
Subjective:    Patient ID: Valerie Mcclure, female    DOB: 05-23-1941, 75 y.o.   MRN: [id number]  HPI The patient is here for follow up.  Chronic leg edema:  We stopped her amlodipine one month ago.  Her swelling is better.  She is taking or what she is taking a water pill dailyAs prescribed. She has also made changes in her diet and has lost weight. She has lost 9 pounds since she was here last. She has decreased, and she is eating and is eating more fruits and vegetables. She is also drinking plenty of fluids.  Hypertension: She is taking her medications daily as prescribed. She denies any chest pain, palpitations, edema or arms or legs, headaches or lightheadedness. She is compliant with a low sodium diet.  Chronic kidney disease: She did have an ultrasound ordered by nephrology, but states she was never called for an appointment. It appears there may be some miscommunication because it looks like she did have an appointment. I did advise her to call at her last visit to schedule an appointment with, but she did not do that. She does not take any NSAIDs. She believes if there was damage to her kidney and may have occurred when she has severe kidney infection and was hospitalized several years ago.  Medications and allergies reviewed with patient and updated if appropriate.  Patient Active Problem List   Diagnosis Date Noted  . Bilateral edema of lower extremity 04/07/2016  . Pancreatic duct dilated 01/09/2016  . CKD (chronic kidney disease) 12/25/2015  . Moderate tricuspid regurgitation 12/25/2015  . Mild mitral regurgitation 12/25/2015  . Prediabetes 06/10/2015  . Edema 06/04/2015  . Abdominal pain, chronic, right upper quadrant 06/04/2015  . Postmenopausal disorder 04/03/2014  . Chronic low back pain   . PAIN IN THORACIC SPINE 04/18/2010  . Coronary atherosclerosis 01/24/2009  . CHRONIC PAIN SYNDROME 01/23/2009  . DEGENERATIVE JOINT DISEASE 01/23/2009  . ARTHRITIS, LEFT  KNEE 12/13/2008  . HERNIATED LUMBAR DISC 12/13/2008  . Hypothyroidism 11/21/2007  . Hyperlipidemia 11/09/2007  . ANEMIA 11/09/2007  . Essential hypertension 11/09/2007  . OSTEOPENIA 11/09/2007    Current Outpatient Prescriptions on File Prior to Visit  Medication Sig Dispense Refill  . ALPRAZolam (XANAX) 0.5 MG tablet TK 1 T PO Q 8 H PRN  2  . Calcium Carbonate-Vit D-Min (CALCIUM 1200 PO) Take by mouth daily.      . Cholecalciferol (VITAMIN D3) 1000 UNITS CAPS Take by mouth daily.      . diphenhydrAMINE (BENADRYL) 25 MG tablet Take 50 mg 1 hour prior to Ct scan 2 tablet 0  . estradiol (ESTRACE) 0.5 MG tablet Take 1 tablet (0.5 mg total) by mouth daily. 30 tablet 11  . FLUoxetine (PROZAC) 20 MG capsule Take 1 capsule (20 mg total) by mouth daily. 30 capsule 11  . furosemide (LASIX) 20 MG tablet Take 1 tablet (20 mg total) by mouth daily. 90 tablet 3  . levothyroxine (SYNTHROID, LEVOTHROID) 88 MCG tablet Take 1 tablet (88 mcg total) by mouth daily. 90 tablet 1  . metoprolol tartrate (LOPRESSOR) 25 MG tablet Take 1 tablet (25 mg total) by mouth 2 (two) times daily. 180 tablet 3  . morphine (MS CONTIN) 30 MG 12 hr tablet TK 1 T PO  Q 8 H  0  . nortriptyline (PAMELOR) 10 MG capsule Take 2 capsules (20 mg total) by mouth at bedtime. 180 capsule 3  . simvastatin (ZOCOR) 10 MG tablet TAKE  1 TABLET(10 MG) BY MOUTH DAILY 90 tablet 2  . valsartan (DIOVAN) 160 MG tablet Take 1 tablet (160 mg total) by mouth daily. 90 tablet 3   No current facility-administered medications on file prior to visit.     Past Medical History:  Diagnosis Date  . Blood transfusion without reported diagnosis   . Chronic low back pain   . Hyperlipidemia   . Osteopenia   . Unspecified essential hypertension   . Unspecified hypothyroidism     Past Surgical History:  Procedure Laterality Date  . ABDOMINAL HYSTERECTOMY  1970  . CHOLECYSTECTOMY  07/2009   Dr. Rise Patience  . COLONOSCOPY  2003  . FLEXIBLE SIGMOIDOSCOPY   2010  . HAND SURGERY    . LUMBAR LAMINECTOMY  11/2008   Done by Dr. Patrice Paradise  . THYROIDECTOMY      Social History   Social History  . Marital status: Married    Spouse name: N/A  . Number of children: N/A  . Years of education: N/A   Social History Main Topics  . Smoking status: Never Smoker  . Smokeless tobacco: Never Used  . Alcohol use No  . Drug use: No  . Sexual activity: Not on file   Other Topics Concern  . Not on file   Social History Narrative   Married    Family History  Problem Relation Age of Onset  . Heart disease Father 39    MI age 65s  . Lung cancer Brother 57  . Colon cancer Neg Hx   . Esophageal cancer Neg Hx   . Rectal cancer Neg Hx   . Stomach cancer Neg Hx     Review of Systems  Constitutional: Negative for fever.  Respiratory: Positive for shortness of breath (with moderate exertion). Negative for cough and wheezing.   Cardiovascular: Negative for chest pain, palpitations and leg swelling.  Neurological: Negative for light-headedness and headaches.       Objective:   Vitals:   04/21/16 1124  BP: 126/76  Pulse: 73  Resp: 16  Temp: 98.9 F (37.2 C)   Filed Weights   04/21/16 1124  Weight: 165 lb (74.8 kg)   Body mass index is 31.18 kg/m.   Physical Exam    Constitutional: Appears well-developed and well-nourished. No distress.  HENT:  Head: Normocephalic and atraumatic.  Neck: Neck supple. No tracheal deviation present. No thyromegaly present.  No cervical lymphadenopathy Cardiovascular: Normal rate, regular rhythm and normal heart sounds.   No murmur heard. No carotid bruit .  No edema Pulmonary/Chest: Effort normal and breath sounds normal. No respiratory distress. No has no wheezes. No rales.  Skin: Skin is warm and dry. Not diaphoretic.  Psychiatric: Normal mood and affect. Behavior is normal.      Assessment & Plan:    See Problem List for Assessment and Plan of chronic medical problems.

## 2016-04-21 NOTE — Progress Notes (Signed)
Pre visit review using our clinic review tool, if applicable. No additional management support is needed unless otherwise documented below in the visit note. 

## 2016-04-21 NOTE — Assessment & Plan Note (Signed)
BP well controlled Current regimen effective and well tolerated Continue current medications at current doses  

## 2016-05-01 ENCOUNTER — Other Ambulatory Visit: Payer: Self-pay | Admitting: Internal Medicine

## 2016-05-20 ENCOUNTER — Other Ambulatory Visit: Payer: Self-pay | Admitting: Internal Medicine

## 2016-06-04 ENCOUNTER — Other Ambulatory Visit: Payer: Self-pay | Admitting: Internal Medicine

## 2016-06-06 ENCOUNTER — Other Ambulatory Visit: Payer: Self-pay | Admitting: Internal Medicine

## 2016-07-01 DIAGNOSIS — G894 Chronic pain syndrome: Secondary | ICD-10-CM | POA: Diagnosis not present

## 2016-07-01 DIAGNOSIS — Z79891 Long term (current) use of opiate analgesic: Secondary | ICD-10-CM | POA: Diagnosis not present

## 2016-07-01 DIAGNOSIS — M4155 Other secondary scoliosis, thoracolumbar region: Secondary | ICD-10-CM | POA: Diagnosis not present

## 2016-07-01 DIAGNOSIS — M47816 Spondylosis without myelopathy or radiculopathy, lumbar region: Secondary | ICD-10-CM | POA: Diagnosis not present

## 2016-07-28 ENCOUNTER — Other Ambulatory Visit: Payer: Self-pay | Admitting: Internal Medicine

## 2016-07-30 NOTE — Progress Notes (Signed)
Subjective:    Patient ID: Valerie Mcclure, female    DOB: November 12, 1940, 76 y.o.   MRN: [id number]  HPI The patient is here for follow up.  Moderate TR: I had ordered an echo in September and it has not been ordered.  We will get that ordered today.  She denies SOB.  CAD, Hypertension, edema: She is taking her medication daily. She is compliant with a low sodium diet.  She has had leg edema and has been taking the lasix daily.  She denies chest pain, palpitations, shortness of breath and regular headaches. She is not exercising regularly.  She does not monitor her blood pressure at home.    Hypothyroidism:  She is taking her medication daily.  She denies any recent changes in energy or weight that are unexplained.   Hyperlipidemia: She is taking her medication daily. She is compliant with a low fat/cholesterol diet. She is not exercising regularly. She denies myalgias.   Prediabetes:  She is compliant with a low sugar/carbohydrate diet.  She is not exercising regularly.  Postmenopausal disorder:  She is taking the estrace daily - 1/2 pill.  She does not think she can come off further at this time.    She follows with pain management for her back pain.  He did take her off the xanax. She is slowly coming off of it - she is having headaches.    CKD:  She has an appointment with nephrology next month.   Medications and allergies reviewed with patient and updated if appropriate.  Patient Active Problem List   Diagnosis Date Noted  . Bilateral edema of lower extremity 04/07/2016  . Pancreatic duct dilated 01/09/2016  . CKD (chronic kidney disease) 12/25/2015  . Moderate tricuspid regurgitation 12/25/2015  . Mild mitral regurgitation 12/25/2015  . Prediabetes 06/10/2015  . Edema 06/04/2015  . Abdominal pain, chronic, right upper quadrant 06/04/2015  . Postmenopausal disorder 04/03/2014  . Chronic low back pain   . PAIN IN THORACIC SPINE 04/18/2010  . Coronary atherosclerosis  01/24/2009  . CHRONIC PAIN SYNDROME 01/23/2009  . DEGENERATIVE JOINT DISEASE 01/23/2009  . ARTHRITIS, LEFT KNEE 12/13/2008  . HERNIATED LUMBAR DISC 12/13/2008  . Hypothyroidism 11/21/2007  . Hyperlipidemia 11/09/2007  . ANEMIA 11/09/2007  . Essential hypertension 11/09/2007  . Osteopenia 11/09/2007    Current Outpatient Prescriptions on File Prior to Visit  Medication Sig Dispense Refill  . Calcium Carbonate-Vit D-Min (CALCIUM 1200 PO) Take by mouth daily.      . Cholecalciferol (VITAMIN D3) 1000 UNITS CAPS Take by mouth daily.      Marland Kitchen estradiol (ESTRACE) 0.5 MG tablet Take 0.5 tablets (0.25 mg total) by mouth daily. 30 tablet 11  . estradiol (ESTRACE) 0.5 MG tablet TAKE 1 TABLET(0.5 MG) BY MOUTH DAILY 90 tablet 2  . FLUoxetine (PROZAC) 20 MG capsule Take 1 capsule (20 mg total) by mouth daily. 30 capsule 11  . furosemide (LASIX) 20 MG tablet TAKE 1 TABLET BY MOUTH DAILY AS NEEDED 90 tablet 1  . levothyroxine (SYNTHROID, LEVOTHROID) 75 MCG tablet TAKE 1 TABLET(75 MCG) BY MOUTH DAILY 90 tablet 1  . levothyroxine (SYNTHROID, LEVOTHROID) 88 MCG tablet Take 1 tablet (88 mcg total) by mouth daily. 90 tablet 1  . metoprolol tartrate (LOPRESSOR) 25 MG tablet TAKE 1 TABLET(25 MG) BY MOUTH TWICE DAILY 180 tablet 2  . morphine (MS CONTIN) 15 MG 12 hr tablet     . nortriptyline (PAMELOR) 10 MG capsule TAKE 2 CAPSULES BY MOUTH  AT BEDTIME 180 capsule 1  . simvastatin (ZOCOR) 10 MG tablet TAKE 1 TABLET(10 MG) BY MOUTH DAILY 90 tablet 2  . valsartan (DIOVAN) 160 MG tablet Take 1 tablet (160 mg total) by mouth daily. 90 tablet 3   No current facility-administered medications on file prior to visit.     Past Medical History:  Diagnosis Date  . Blood transfusion without reported diagnosis   . Chronic low back pain   . Hyperlipidemia   . Osteopenia   . Unspecified essential hypertension   . Unspecified hypothyroidism     Past Surgical History:  Procedure Laterality Date  . ABDOMINAL  HYSTERECTOMY  1970  . CHOLECYSTECTOMY  07/2009   Dr. Rise Patience  . COLONOSCOPY  2003  . FLEXIBLE SIGMOIDOSCOPY  2010  . HAND SURGERY    . LUMBAR LAMINECTOMY  11/2008   Done by Dr. Patrice Paradise  . THYROIDECTOMY      Social History   Social History  . Marital status: Married    Spouse name: N/A  . Number of children: N/A  . Years of education: N/A   Social History Main Topics  . Smoking status: Never Smoker  . Smokeless tobacco: Never Used  . Alcohol use No  . Drug use: No  . Sexual activity: Not Asked   Other Topics Concern  . None   Social History Narrative   Married    Family History  Problem Relation Age of Onset  . Heart disease Father 30    MI age 21s  . Lung cancer Brother 63  . Colon cancer Neg Hx   . Esophageal cancer Neg Hx   . Rectal cancer Neg Hx   . Stomach cancer Neg Hx     Review of Systems  Constitutional: Negative for chills and fever.  Respiratory: Negative for apnea (no gasping/choking, denies snoring), cough, shortness of breath and wheezing.   Cardiovascular: Positive for palpitations and leg swelling (controlled with daily lasix). Negative for chest pain.  Neurological: Positive for headaches. Negative for light-headedness.       Objective:   Vitals:   07/31/16 1016  BP: (!) 144/70  Pulse: (!) 108  Resp: 16  Temp: 98.3 F (36.8 C)   Wt Readings from Last 3 Encounters:  07/31/16 165 lb (74.8 kg)  04/21/16 165 lb (74.8 kg)  04/07/16 174 lb (78.9 kg)   Body mass index is 31.18 kg/m.   Physical Exam    Constitutional: Appears well-developed and well-nourished. No distress.  HENT:  Head: Normocephalic and atraumatic.  Neck: Neck supple. No tracheal deviation present. No thyromegaly present.  No cervical lymphadenopathy Cardiovascular: Normal rate, regular rhythm and normal heart sounds.   2/6 systolic murmur heard. No carotid bruit .  No edema Pulmonary/Chest: Effort normal and breath sounds normal. No respiratory distress. No has no  wheezes. No rales.  Skin: Skin is warm and dry. Not diaphoretic.  Psychiatric: Normal mood and affect. Behavior is normal.      Assessment & Plan:    See Problem List for Assessment and Plan of chronic medical problems.   FU in 6 months

## 2016-07-30 NOTE — Patient Instructions (Addendum)
  Test(s) ordered today. Your results will be released to [facility] (or called to you) after review, usually within 72hours after test completion. If any changes need to be made, you will be notified at that same time.  All other Health Maintenance issues reviewed.   All recommended immunizations and age-appropriate screenings are up-to-date or discussed.  No immunizations administered today. Check on the tetanus vaccine with your insurance.   Medications reviewed and updated.  No changes recommended at this time.    Please followup in 6 months

## 2016-07-31 ENCOUNTER — Other Ambulatory Visit (INDEPENDENT_AMBULATORY_CARE_PROVIDER_SITE_OTHER): Payer: Medicare Other

## 2016-07-31 ENCOUNTER — Encounter: Payer: Self-pay | Admitting: Internal Medicine

## 2016-07-31 ENCOUNTER — Ambulatory Visit (INDEPENDENT_AMBULATORY_CARE_PROVIDER_SITE_OTHER): Payer: Medicare Other | Admitting: Internal Medicine

## 2016-07-31 ENCOUNTER — Ambulatory Visit: Payer: Medicare Other | Admitting: Internal Medicine

## 2016-07-31 VITALS — BP 144/70 | HR 108 | Temp 98.3°F | Resp 16 | Wt 165.0 lb

## 2016-07-31 DIAGNOSIS — R7989 Other specified abnormal findings of blood chemistry: Secondary | ICD-10-CM | POA: Diagnosis not present

## 2016-07-31 DIAGNOSIS — N189 Chronic kidney disease, unspecified: Secondary | ICD-10-CM | POA: Diagnosis not present

## 2016-07-31 DIAGNOSIS — G8929 Other chronic pain: Secondary | ICD-10-CM

## 2016-07-31 DIAGNOSIS — E7800 Pure hypercholesterolemia, unspecified: Secondary | ICD-10-CM

## 2016-07-31 DIAGNOSIS — I10 Essential (primary) hypertension: Secondary | ICD-10-CM | POA: Diagnosis not present

## 2016-07-31 DIAGNOSIS — M8580 Other specified disorders of bone density and structure, unspecified site: Secondary | ICD-10-CM

## 2016-07-31 DIAGNOSIS — R609 Edema, unspecified: Secondary | ICD-10-CM

## 2016-07-31 DIAGNOSIS — N951 Menopausal and female climacteric states: Secondary | ICD-10-CM | POA: Diagnosis not present

## 2016-07-31 DIAGNOSIS — R1011 Right upper quadrant pain: Secondary | ICD-10-CM

## 2016-07-31 DIAGNOSIS — R7303 Prediabetes: Secondary | ICD-10-CM | POA: Diagnosis not present

## 2016-07-31 DIAGNOSIS — M545 Low back pain: Secondary | ICD-10-CM

## 2016-07-31 DIAGNOSIS — E038 Other specified hypothyroidism: Secondary | ICD-10-CM

## 2016-07-31 DIAGNOSIS — I071 Rheumatic tricuspid insufficiency: Secondary | ICD-10-CM

## 2016-07-31 LAB — COMPREHENSIVE METABOLIC PANEL
ALBUMIN: 4.4 g/dL (ref 3.5–5.2)
ALT: 16 U/L (ref 0–35)
AST: 21 U/L (ref 0–37)
Alkaline Phosphatase: 71 U/L (ref 39–117)
BILIRUBIN TOTAL: 0.4 mg/dL (ref 0.2–1.2)
BUN: 28 mg/dL — ABNORMAL HIGH (ref 6–23)
CALCIUM: 9.5 mg/dL (ref 8.4–10.5)
CHLORIDE: 100 meq/L (ref 96–112)
CO2: 32 mEq/L (ref 19–32)
CREATININE: 1.4 mg/dL — AB (ref 0.40–1.20)
GFR: 38.77 mL/min — AB (ref >60.00)
Glucose, Bld: 106 mg/dL — ABNORMAL HIGH (ref 70–99)
Potassium: 4.1 mEq/L (ref 3.5–5.1)
Sodium: 140 mEq/L (ref 135–145)
Total Protein: 7.5 g/dL (ref 6.0–8.3)

## 2016-07-31 LAB — LIPID PANEL
Cholesterol: 182 mg/dL (ref 0–200)
HDL: 44.9 mg/dL (ref >39.00)
NonHDL: 137.2
Total CHOL/HDL Ratio: 4
Triglycerides: 210 mg/dL — ABNORMAL HIGH (ref 0.0–149.0)
VLDL: 42 mg/dL — ABNORMAL HIGH (ref 0.0–40.0)

## 2016-07-31 LAB — VITAMIN B12: Vitamin B-12: >1500 pg/mL — ABNORMAL HIGH (ref 211–911)

## 2016-07-31 LAB — TSH: TSH: 4.34 u[IU]/mL (ref 0.35–4.50)

## 2016-07-31 LAB — HEMOGLOBIN A1C: HEMOGLOBIN A1C: 5.9 % (ref 4.6–6.5)

## 2016-07-31 LAB — FOLATE

## 2016-07-31 LAB — LDL CHOLESTEROL, DIRECT: Direct LDL: 79 mg/dL

## 2016-07-31 NOTE — Assessment & Plan Note (Signed)
Management per pain management Morphine 15 mg Q 12

## 2016-07-31 NOTE — Progress Notes (Signed)
Pre visit review using our clinic review tool, if applicable. No additional management support is needed unless otherwise documented below in the visit note. 

## 2016-07-31 NOTE — Assessment & Plan Note (Signed)
Check a1c Low sugar / carb diet Stressed regular exercise, keeping weight down/weight loss

## 2016-07-31 NOTE — Assessment & Plan Note (Signed)
Nortriptyline effective continue

## 2016-07-31 NOTE — Assessment & Plan Note (Signed)
Check tsh  Titrate med dose if needed  

## 2016-07-31 NOTE — Assessment & Plan Note (Signed)
Coming off of xanax  - not a good time to attempt coming off estrogen but will consider in near future

## 2016-07-31 NOTE — Assessment & Plan Note (Signed)
Check lipid panel  Continue daily statin Regular exercise and healthy diet encouraged  

## 2016-07-31 NOTE — Assessment & Plan Note (Signed)
Stressed seeing nephrology next month - she was not sure she should go Avoid nsaids Stressed good BP control and good sugar control Exercise, weight loss advised

## 2016-07-31 NOTE — Assessment & Plan Note (Signed)
Taking lasix daily at this point Swelling controlled cmp

## 2016-07-31 NOTE — Assessment & Plan Note (Addendum)
She deferred having a dexa now Will discuss in the future Taking calcium and vitamin Stressed regular exercise

## 2016-07-31 NOTE — Assessment & Plan Note (Signed)
BP Readings from Last 3 Encounters:  07/31/16 (!) 144/70  04/21/16 126/76  04/07/16 134/84    BP controlled Current regimen effective and well tolerated Continue current medications at current doses cmp

## 2016-07-31 NOTE — Assessment & Plan Note (Signed)
Echo has not been done - will be scheduled asymptomatic

## 2016-08-01 ENCOUNTER — Encounter: Payer: Self-pay | Admitting: Internal Medicine

## 2016-08-19 ENCOUNTER — Other Ambulatory Visit: Payer: Self-pay

## 2016-08-19 ENCOUNTER — Encounter (INDEPENDENT_AMBULATORY_CARE_PROVIDER_SITE_OTHER): Payer: Self-pay

## 2016-08-19 ENCOUNTER — Ambulatory Visit (HOSPITAL_COMMUNITY): Payer: Medicare Other | Attending: Cardiovascular Disease

## 2016-08-19 DIAGNOSIS — I071 Rheumatic tricuspid insufficiency: Secondary | ICD-10-CM | POA: Insufficient documentation

## 2016-08-19 DIAGNOSIS — I351 Nonrheumatic aortic (valve) insufficiency: Secondary | ICD-10-CM | POA: Diagnosis not present

## 2016-08-19 DIAGNOSIS — R600 Localized edema: Secondary | ICD-10-CM | POA: Diagnosis not present

## 2016-08-19 DIAGNOSIS — I313 Pericardial effusion (noninflammatory): Secondary | ICD-10-CM | POA: Insufficient documentation

## 2016-08-20 ENCOUNTER — Encounter: Payer: Self-pay | Admitting: Internal Medicine

## 2016-08-20 DIAGNOSIS — I351 Nonrheumatic aortic (valve) insufficiency: Secondary | ICD-10-CM | POA: Insufficient documentation

## 2016-08-22 ENCOUNTER — Encounter: Payer: Self-pay | Admitting: Student

## 2016-09-03 ENCOUNTER — Telehealth: Payer: Self-pay | Admitting: Internal Medicine

## 2016-09-03 NOTE — Telephone Encounter (Signed)
Spoke with patient regarding awv. Patient stated that she has already had an awv by [facility]. Informed patient the she will need to bring a copy of her paper work to the office for it to be scanned into her chart. Patient stated that she will stop by the office or just bring the paper work when she comes in for her next visit with Dr. Quay Burow.

## 2016-09-23 DIAGNOSIS — E785 Hyperlipidemia, unspecified: Secondary | ICD-10-CM | POA: Diagnosis not present

## 2016-09-23 DIAGNOSIS — M47816 Spondylosis without myelopathy or radiculopathy, lumbar region: Secondary | ICD-10-CM | POA: Diagnosis not present

## 2016-09-23 DIAGNOSIS — Z79891 Long term (current) use of opiate analgesic: Secondary | ICD-10-CM | POA: Diagnosis not present

## 2016-09-23 DIAGNOSIS — M4155 Other secondary scoliosis, thoracolumbar region: Secondary | ICD-10-CM | POA: Diagnosis not present

## 2016-09-23 DIAGNOSIS — E039 Hypothyroidism, unspecified: Secondary | ICD-10-CM | POA: Diagnosis not present

## 2016-09-23 DIAGNOSIS — N183 Chronic kidney disease, stage 3 (moderate): Secondary | ICD-10-CM | POA: Diagnosis not present

## 2016-09-23 DIAGNOSIS — G894 Chronic pain syndrome: Secondary | ICD-10-CM | POA: Diagnosis not present

## 2016-09-23 DIAGNOSIS — N184 Chronic kidney disease, stage 4 (severe): Secondary | ICD-10-CM | POA: Diagnosis not present

## 2016-09-23 DIAGNOSIS — I129 Hypertensive chronic kidney disease with stage 1 through stage 4 chronic kidney disease, or unspecified chronic kidney disease: Secondary | ICD-10-CM | POA: Diagnosis not present

## 2016-10-14 ENCOUNTER — Other Ambulatory Visit: Payer: Self-pay | Admitting: Internal Medicine

## 2016-11-16 DIAGNOSIS — N183 Chronic kidney disease, stage 3 (moderate): Secondary | ICD-10-CM | POA: Diagnosis not present

## 2016-11-16 DIAGNOSIS — I129 Hypertensive chronic kidney disease with stage 1 through stage 4 chronic kidney disease, or unspecified chronic kidney disease: Secondary | ICD-10-CM | POA: Diagnosis not present

## 2016-11-16 DIAGNOSIS — R002 Palpitations: Secondary | ICD-10-CM | POA: Diagnosis not present

## 2016-11-17 DIAGNOSIS — Z8673 Personal history of transient ischemic attack (TIA), and cerebral infarction without residual deficits: Secondary | ICD-10-CM

## 2016-11-17 HISTORY — DX: Personal history of transient ischemic attack (TIA), and cerebral infarction without residual deficits: Z86.73

## 2016-11-29 ENCOUNTER — Inpatient Hospital Stay (HOSPITAL_COMMUNITY): Payer: Medicare Other

## 2016-11-29 ENCOUNTER — Emergency Department (HOSPITAL_COMMUNITY): Payer: Medicare Other

## 2016-11-29 ENCOUNTER — Inpatient Hospital Stay (HOSPITAL_COMMUNITY)
Admission: EM | Admit: 2016-11-29 | Discharge: 2016-12-03 | DRG: 023 | Disposition: A | Discharge status: Rehabilitation Facility | Payer: Medicare Other | Attending: Neurology | Admitting: Neurology

## 2016-11-29 ENCOUNTER — Encounter (HOSPITAL_COMMUNITY)
Admission: EM | Disposition: A | Discharge status: Rehabilitation Facility | Payer: Self-pay | Source: Home / Self Care | Attending: Neurology

## 2016-11-29 ENCOUNTER — Emergency Department (HOSPITAL_COMMUNITY): Payer: Medicare Other | Admitting: Anesthesiology

## 2016-11-29 ENCOUNTER — Encounter (HOSPITAL_COMMUNITY): Payer: Self-pay | Admitting: Emergency Medicine

## 2016-11-29 DIAGNOSIS — R29714 NIHSS score 14: Secondary | ICD-10-CM | POA: Diagnosis present

## 2016-11-29 DIAGNOSIS — I9762 Postprocedural hemorrhage of a circulatory system organ or structure following other procedure: Secondary | ICD-10-CM | POA: Diagnosis not present

## 2016-11-29 DIAGNOSIS — N182 Chronic kidney disease, stage 2 (mild): Secondary | ICD-10-CM | POA: Diagnosis present

## 2016-11-29 DIAGNOSIS — Z79891 Long term (current) use of opiate analgesic: Secondary | ICD-10-CM

## 2016-11-29 DIAGNOSIS — R4701 Aphasia: Secondary | ICD-10-CM | POA: Diagnosis present

## 2016-11-29 DIAGNOSIS — R1310 Dysphagia, unspecified: Secondary | ICD-10-CM | POA: Diagnosis not present

## 2016-11-29 DIAGNOSIS — I615 Nontraumatic intracerebral hemorrhage, intraventricular: Secondary | ICD-10-CM | POA: Diagnosis not present

## 2016-11-29 DIAGNOSIS — I610 Nontraumatic intracerebral hemorrhage in hemisphere, subcortical: Secondary | ICD-10-CM | POA: Diagnosis not present

## 2016-11-29 DIAGNOSIS — R402353 Coma scale, best motor response, localizes pain, at hospital admission: Secondary | ICD-10-CM | POA: Diagnosis present

## 2016-11-29 DIAGNOSIS — I6201 Nontraumatic acute subdural hemorrhage: Secondary | ICD-10-CM | POA: Diagnosis not present

## 2016-11-29 DIAGNOSIS — D631 Anemia in chronic kidney disease: Secondary | ICD-10-CM | POA: Diagnosis not present

## 2016-11-29 DIAGNOSIS — I340 Nonrheumatic mitral (valve) insufficiency: Secondary | ICD-10-CM | POA: Diagnosis not present

## 2016-11-29 DIAGNOSIS — R7303 Prediabetes: Secondary | ICD-10-CM | POA: Diagnosis not present

## 2016-11-29 DIAGNOSIS — B9620 Unspecified Escherichia coli [E. coli] as the cause of diseases classified elsewhere: Secondary | ICD-10-CM | POA: Diagnosis not present

## 2016-11-29 DIAGNOSIS — Z91041 Radiographic dye allergy status: Secondary | ICD-10-CM

## 2016-11-29 DIAGNOSIS — Q251 Coarctation of aorta: Secondary | ICD-10-CM

## 2016-11-29 DIAGNOSIS — Z91013 Allergy to seafood: Secondary | ICD-10-CM | POA: Diagnosis not present

## 2016-11-29 DIAGNOSIS — I491 Atrial premature depolarization: Secondary | ICD-10-CM

## 2016-11-29 DIAGNOSIS — E890 Postprocedural hypothyroidism: Secondary | ICD-10-CM | POA: Diagnosis present

## 2016-11-29 DIAGNOSIS — Z9289 Personal history of other medical treatment: Secondary | ICD-10-CM

## 2016-11-29 DIAGNOSIS — D649 Anemia, unspecified: Secondary | ICD-10-CM | POA: Diagnosis not present

## 2016-11-29 DIAGNOSIS — Z79890 Hormone replacement therapy (postmenopausal): Secondary | ICD-10-CM

## 2016-11-29 DIAGNOSIS — Z880 Allergy status to penicillin: Secondary | ICD-10-CM | POA: Diagnosis not present

## 2016-11-29 DIAGNOSIS — M545 Low back pain: Secondary | ICD-10-CM | POA: Diagnosis not present

## 2016-11-29 DIAGNOSIS — I700 Atherosclerosis of aorta: Secondary | ICD-10-CM | POA: Diagnosis not present

## 2016-11-29 DIAGNOSIS — N179 Acute kidney failure, unspecified: Secondary | ICD-10-CM | POA: Diagnosis present

## 2016-11-29 DIAGNOSIS — I69320 Aphasia following cerebral infarction: Secondary | ICD-10-CM | POA: Diagnosis not present

## 2016-11-29 DIAGNOSIS — N390 Urinary tract infection, site not specified: Secondary | ICD-10-CM | POA: Diagnosis not present

## 2016-11-29 DIAGNOSIS — Z79899 Other long term (current) drug therapy: Secondary | ICD-10-CM | POA: Diagnosis not present

## 2016-11-29 DIAGNOSIS — Z0189 Encounter for other specified special examinations: Secondary | ICD-10-CM

## 2016-11-29 DIAGNOSIS — I63412 Cerebral infarction due to embolism of left middle cerebral artery: Principal | ICD-10-CM | POA: Diagnosis present

## 2016-11-29 DIAGNOSIS — I4891 Unspecified atrial fibrillation: Secondary | ICD-10-CM | POA: Diagnosis not present

## 2016-11-29 DIAGNOSIS — T40601A Poisoning by unspecified narcotics, accidental (unintentional), initial encounter: Secondary | ICD-10-CM | POA: Diagnosis not present

## 2016-11-29 DIAGNOSIS — G9340 Encephalopathy, unspecified: Secondary | ICD-10-CM | POA: Diagnosis not present

## 2016-11-29 DIAGNOSIS — N183 Chronic kidney disease, stage 3 (moderate): Secondary | ICD-10-CM | POA: Diagnosis not present

## 2016-11-29 DIAGNOSIS — R471 Dysarthria and anarthria: Secondary | ICD-10-CM | POA: Diagnosis present

## 2016-11-29 DIAGNOSIS — I5032 Chronic diastolic (congestive) heart failure: Secondary | ICD-10-CM | POA: Diagnosis not present

## 2016-11-29 DIAGNOSIS — I69351 Hemiplegia and hemiparesis following cerebral infarction affecting right dominant side: Secondary | ICD-10-CM | POA: Diagnosis not present

## 2016-11-29 DIAGNOSIS — Y848 Other medical procedures as the cause of abnormal reaction of the patient, or of later complication, without mention of misadventure at the time of the procedure: Secondary | ICD-10-CM | POA: Diagnosis present

## 2016-11-29 DIAGNOSIS — Z888 Allergy status to other drugs, medicaments and biological substances status: Secondary | ICD-10-CM | POA: Diagnosis not present

## 2016-11-29 DIAGNOSIS — I5030 Unspecified diastolic (congestive) heart failure: Secondary | ICD-10-CM | POA: Diagnosis not present

## 2016-11-29 DIAGNOSIS — G8929 Other chronic pain: Secondary | ICD-10-CM | POA: Diagnosis not present

## 2016-11-29 DIAGNOSIS — I639 Cerebral infarction, unspecified: Secondary | ICD-10-CM | POA: Diagnosis not present

## 2016-11-29 DIAGNOSIS — I69920 Aphasia following unspecified cerebrovascular disease: Secondary | ICD-10-CM | POA: Diagnosis not present

## 2016-11-29 DIAGNOSIS — G935 Compression of brain: Secondary | ICD-10-CM | POA: Diagnosis not present

## 2016-11-29 DIAGNOSIS — R402233 Coma scale, best verbal response, inappropriate words, at hospital admission: Secondary | ICD-10-CM | POA: Diagnosis present

## 2016-11-29 DIAGNOSIS — I6389 Other cerebral infarction: Secondary | ICD-10-CM

## 2016-11-29 DIAGNOSIS — E785 Hyperlipidemia, unspecified: Secondary | ICD-10-CM | POA: Diagnosis not present

## 2016-11-29 DIAGNOSIS — I638 Other cerebral infarction: Secondary | ICD-10-CM | POA: Diagnosis not present

## 2016-11-29 DIAGNOSIS — I6602 Occlusion and stenosis of left middle cerebral artery: Secondary | ICD-10-CM | POA: Diagnosis not present

## 2016-11-29 DIAGNOSIS — R29810 Facial weakness: Secondary | ICD-10-CM | POA: Diagnosis present

## 2016-11-29 DIAGNOSIS — J9600 Acute respiratory failure, unspecified whether with hypoxia or hypercapnia: Secondary | ICD-10-CM | POA: Diagnosis not present

## 2016-11-29 DIAGNOSIS — Z8673 Personal history of transient ischemic attack (TIA), and cerebral infarction without residual deficits: Secondary | ICD-10-CM | POA: Diagnosis present

## 2016-11-29 DIAGNOSIS — G8191 Hemiplegia, unspecified affecting right dominant side: Secondary | ICD-10-CM | POA: Diagnosis not present

## 2016-11-29 DIAGNOSIS — E7800 Pure hypercholesterolemia, unspecified: Secondary | ICD-10-CM | POA: Diagnosis present

## 2016-11-29 DIAGNOSIS — I130 Hypertensive heart and chronic kidney disease with heart failure and stage 1 through stage 4 chronic kidney disease, or unspecified chronic kidney disease: Secondary | ICD-10-CM | POA: Diagnosis present

## 2016-11-29 DIAGNOSIS — M8580 Other specified disorders of bone density and structure, unspecified site: Secondary | ICD-10-CM | POA: Diagnosis present

## 2016-11-29 DIAGNOSIS — Z4682 Encounter for fitting and adjustment of non-vascular catheter: Secondary | ICD-10-CM | POA: Diagnosis not present

## 2016-11-29 DIAGNOSIS — R402123 Coma scale, eyes open, to pain, at hospital admission: Secondary | ICD-10-CM | POA: Diagnosis not present

## 2016-11-29 DIAGNOSIS — I69391 Dysphagia following cerebral infarction: Secondary | ICD-10-CM | POA: Diagnosis not present

## 2016-11-29 DIAGNOSIS — Z6831 Body mass index (BMI) 31.0-31.9, adult: Secondary | ICD-10-CM

## 2016-11-29 DIAGNOSIS — J9601 Acute respiratory failure with hypoxia: Secondary | ICD-10-CM | POA: Diagnosis not present

## 2016-11-29 DIAGNOSIS — E876 Hypokalemia: Secondary | ICD-10-CM | POA: Diagnosis not present

## 2016-11-29 DIAGNOSIS — I6789 Other cerebrovascular disease: Secondary | ICD-10-CM | POA: Diagnosis not present

## 2016-11-29 DIAGNOSIS — I10 Essential (primary) hypertension: Secondary | ICD-10-CM | POA: Diagnosis not present

## 2016-11-29 DIAGNOSIS — I63312 Cerebral infarction due to thrombosis of left middle cerebral artery: Secondary | ICD-10-CM | POA: Diagnosis not present

## 2016-11-29 DIAGNOSIS — N189 Chronic kidney disease, unspecified: Secondary | ICD-10-CM | POA: Diagnosis not present

## 2016-11-29 DIAGNOSIS — G936 Cerebral edema: Secondary | ICD-10-CM | POA: Diagnosis not present

## 2016-11-29 DIAGNOSIS — E669 Obesity, unspecified: Secondary | ICD-10-CM | POA: Diagnosis present

## 2016-11-29 DIAGNOSIS — R488 Other symbolic dysfunctions: Secondary | ICD-10-CM | POA: Diagnosis not present

## 2016-11-29 DIAGNOSIS — G8190 Hemiplegia, unspecified affecting unspecified side: Secondary | ICD-10-CM | POA: Diagnosis not present

## 2016-11-29 DIAGNOSIS — I351 Nonrheumatic aortic (valve) insufficiency: Secondary | ICD-10-CM | POA: Diagnosis not present

## 2016-11-29 DIAGNOSIS — I671 Cerebral aneurysm, nonruptured: Secondary | ICD-10-CM | POA: Diagnosis not present

## 2016-11-29 DIAGNOSIS — R0989 Other specified symptoms and signs involving the circulatory and respiratory systems: Secondary | ICD-10-CM

## 2016-11-29 DIAGNOSIS — I619 Nontraumatic intracerebral hemorrhage, unspecified: Secondary | ICD-10-CM | POA: Diagnosis not present

## 2016-11-29 DIAGNOSIS — G629 Polyneuropathy, unspecified: Secondary | ICD-10-CM | POA: Diagnosis not present

## 2016-11-29 DIAGNOSIS — I63512 Cerebral infarction due to unspecified occlusion or stenosis of left middle cerebral artery: Secondary | ICD-10-CM | POA: Diagnosis not present

## 2016-11-29 DIAGNOSIS — F329 Major depressive disorder, single episode, unspecified: Secondary | ICD-10-CM | POA: Diagnosis present

## 2016-11-29 DIAGNOSIS — N3000 Acute cystitis without hematuria: Secondary | ICD-10-CM | POA: Diagnosis not present

## 2016-11-29 DIAGNOSIS — D638 Anemia in other chronic diseases classified elsewhere: Secondary | ICD-10-CM | POA: Diagnosis not present

## 2016-11-29 DIAGNOSIS — I6350 Cerebral infarction due to unspecified occlusion or stenosis of unspecified cerebral artery: Secondary | ICD-10-CM | POA: Diagnosis not present

## 2016-11-29 DIAGNOSIS — E039 Hypothyroidism, unspecified: Secondary | ICD-10-CM | POA: Diagnosis not present

## 2016-11-29 HISTORY — PX: IR ANGIO INTRA EXTRACRAN SEL COM CAROTID INNOMINATE UNI L MOD SED: IMG5358

## 2016-11-29 HISTORY — PX: RADIOLOGY WITH ANESTHESIA: SHX6223

## 2016-11-29 HISTORY — PX: IR ANGIO VERTEBRAL SEL SUBCLAVIAN INNOMINATE UNI R MOD SED: IMG5365

## 2016-11-29 HISTORY — PX: IR PERCUTANEOUS ART THROMBECTOMY/INFUSION INTRACRANIAL INC DIAG ANGIO: IMG6087

## 2016-11-29 LAB — DIFFERENTIAL
BASOS PCT: 0 %
Basophils Absolute: 0 10*3/uL (ref 0.0–0.1)
EOS ABS: 0 10*3/uL (ref 0.0–0.7)
Eosinophils Relative: 0 %
Lymphocytes Relative: 14 %
Lymphs Abs: 0.9 10*3/uL (ref 0.7–4.0)
MONO ABS: 0.2 10*3/uL (ref 0.1–1.0)
Monocytes Relative: 3 %
Neutro Abs: 5.3 10*3/uL (ref 1.7–7.7)
Neutrophils Relative %: 83 %

## 2016-11-29 LAB — COMPREHENSIVE METABOLIC PANEL
ALT: 17 U/L (ref 14–54)
ANION GAP: 8 (ref 5–15)
AST: 29 U/L (ref 15–41)
Albumin: 3.4 g/dL — ABNORMAL LOW (ref 3.5–5.0)
Alkaline Phosphatase: 56 U/L (ref 38–126)
BUN: 24 mg/dL — ABNORMAL HIGH (ref 6–20)
CHLORIDE: 102 mmol/L (ref 101–111)
CO2: 27 mmol/L (ref 22–32)
Calcium: 8.7 mg/dL — ABNORMAL LOW (ref 8.9–10.3)
Creatinine, Ser: 1.49 mg/dL — ABNORMAL HIGH (ref 0.44–1.00)
GFR calc Af Amer: 38 mL/min — ABNORMAL LOW (ref >60)
GFR, EST NON AFRICAN AMERICAN: 33 mL/min — AB (ref >60)
Glucose, Bld: 141 mg/dL — ABNORMAL HIGH (ref 65–99)
POTASSIUM: 3.7 mmol/L (ref 3.5–5.1)
Sodium: 137 mmol/L (ref 135–145)
Total Bilirubin: 0.3 mg/dL (ref 0.3–1.2)
Total Protein: 6.5 g/dL (ref 6.5–8.1)

## 2016-11-29 LAB — I-STAT CHEM 8, ED
BUN: 27 mg/dL — ABNORMAL HIGH (ref 6–20)
CALCIUM ION: 1.1 mmol/L — AB (ref 1.15–1.40)
Chloride: 100 mmol/L — ABNORMAL LOW (ref 101–111)
Creatinine, Ser: 1.4 mg/dL — ABNORMAL HIGH (ref 0.44–1.00)
Glucose, Bld: 139 mg/dL — ABNORMAL HIGH (ref 65–99)
HCT: 33 % — ABNORMAL LOW (ref 36.0–46.0)
Hemoglobin: 11.2 g/dL — ABNORMAL LOW (ref 12.0–15.0)
Potassium: 3.7 mmol/L (ref 3.5–5.1)
SODIUM: 139 mmol/L (ref 135–145)
TCO2: 27 mmol/L (ref 0–100)

## 2016-11-29 LAB — BLOOD GAS, ARTERIAL
Acid-base deficit: 2.2 mmol/L — ABNORMAL HIGH (ref 0.0–2.0)
Bicarbonate: 22.5 mmol/L (ref 20.0–28.0)
DRAWN BY: 21338
FIO2: 40
MECHVT: 480 mL
O2 SAT: 98.4 %
PATIENT TEMPERATURE: 98.6
PCO2 ART: 41.3 mmHg (ref 32.0–48.0)
PEEP: 5 cmH2O
PH ART: 7.355 (ref 7.350–7.450)
RATE: 12 resp/min
pO2, Arterial: 131 mmHg — ABNORMAL HIGH (ref 83.0–108.0)

## 2016-11-29 LAB — I-STAT TROPONIN, ED: TROPONIN I, POC: 1.86 ng/mL — AB (ref 0.00–0.08)

## 2016-11-29 LAB — PROTIME-INR
INR: 1.01
Prothrombin Time: 13.3 seconds (ref 11.4–15.2)

## 2016-11-29 LAB — CBG MONITORING, ED: GLUCOSE-CAPILLARY: 121 mg/dL — AB (ref 65–99)

## 2016-11-29 LAB — CBC
HCT: 35.2 % — ABNORMAL LOW (ref 36.0–46.0)
Hemoglobin: 11.7 g/dL — ABNORMAL LOW (ref 12.0–15.0)
MCH: 30.4 pg (ref 26.0–34.0)
MCHC: 33.2 g/dL (ref 30.0–36.0)
MCV: 91.4 fL (ref 78.0–100.0)
Platelets: 204 10*3/uL (ref 150–400)
RBC: 3.85 MIL/uL — ABNORMAL LOW (ref 3.87–5.11)
RDW: 14.2 % (ref 11.5–15.5)
WBC: 6.3 10*3/uL (ref 4.0–10.5)

## 2016-11-29 LAB — APTT: APTT: 29 s (ref 24–36)

## 2016-11-29 SURGERY — RADIOLOGY WITH ANESTHESIA
Anesthesia: General

## 2016-11-29 MED ORDER — VANCOMYCIN HCL IN DEXTROSE 1-5 GM/200ML-% IV SOLN
INTRAVENOUS | Status: AC
Start: 2016-11-29 — End: 2016-11-29
  Filled 2016-11-29: qty 200

## 2016-11-29 MED ORDER — HYDRALAZINE HCL 20 MG/ML IJ SOLN
10.0000 mg | INTRAMUSCULAR | Status: DC | PRN
  Administered 2016-11-29 – 2016-11-30 (×3): 10 mg via INTRAVENOUS
  Filled 2016-11-29 (×3): qty 1

## 2016-11-29 MED ORDER — SODIUM CHLORIDE 0.9 % IJ SOLN
INTRAVENOUS | Status: AC | PRN
  Administered 2016-11-29 (×4): 25 ug via INTRA_ARTERIAL

## 2016-11-29 MED ORDER — CLEVIDIPINE BUTYRATE 0.5 MG/ML IV EMUL
0.0000 mg/h | INTRAVENOUS | Status: DC
  Administered 2016-11-29: 5 mg/h via INTRAVENOUS
  Administered 2016-11-29: 10 mg/h via INTRAVENOUS
  Filled 2016-11-29 (×4): qty 50

## 2016-11-29 MED ORDER — ACETAMINOPHEN 650 MG RE SUPP: 650.0000 mg | RECTAL | Status: DC | PRN

## 2016-11-29 MED ORDER — PHENYLEPHRINE HCL 10 MG/ML IJ SOLN
INTRAVENOUS | Status: DC | PRN
  Administered 2016-11-29: 30 ug/min via INTRAVENOUS

## 2016-11-29 MED ORDER — ACETAMINOPHEN 160 MG/5ML PO SOLN: 650.0000 mg | ORAL | Status: DC | PRN

## 2016-11-29 MED ORDER — SODIUM CHLORIDE 0.9 % IV SOLN
INTRAVENOUS | Status: DC
  Administered 2016-11-29 – 2016-12-02 (×4): via INTRAVENOUS

## 2016-11-29 MED ORDER — SODIUM CHLORIDE 0.9 % IV SOLN
INTRAVENOUS | Status: DC | PRN
  Administered 2016-11-29: 11:00:00 via INTRAVENOUS

## 2016-11-29 MED ORDER — EPTIFIBATIDE 20 MG/10ML IV SOLN
INTRAVENOUS | Status: DC
Start: 2016-11-29 — End: 2016-11-29
  Filled 2016-11-29: qty 10

## 2016-11-29 MED ORDER — METHYLPREDNISOLONE SODIUM SUCC 125 MG IJ SOLR
125.0000 mg | Freq: Once | INTRAMUSCULAR | Status: AC
  Administered 2016-11-29: 125 mg via INTRAVENOUS

## 2016-11-29 MED ORDER — VANCOMYCIN HCL 1000 MG IV SOLR
INTRAVENOUS | Status: DC | PRN
  Administered 2016-11-29: 1000 mg via INTRAVENOUS

## 2016-11-29 MED ORDER — ONDANSETRON HCL 4 MG/2ML IJ SOLN: 4.0000 mg | Freq: Four times a day (QID) | INTRAMUSCULAR | Status: DC | PRN

## 2016-11-29 MED ORDER — SENNOSIDES-DOCUSATE SODIUM 8.6-50 MG PO TABS: 1.0000 | ORAL_TABLET | Freq: Every evening | ORAL | Status: DC | PRN

## 2016-11-29 MED ORDER — PROPOFOL 1000 MG/100ML IV EMUL
5.0000 ug/kg/min | INTRAVENOUS | Status: DC
  Administered 2016-11-29: 25 ug/kg/min via INTRAVENOUS
  Administered 2016-11-29: 50 ug/kg/min via INTRAVENOUS
  Administered 2016-11-30: 25 ug/kg/min via INTRAVENOUS
  Filled 2016-11-29 (×4): qty 100

## 2016-11-29 MED ORDER — PROPOFOL 500 MG/50ML IV EMUL
INTRAVENOUS | Status: DC | PRN
  Administered 2016-11-29: 50 ug/kg/min via INTRAVENOUS

## 2016-11-29 MED ORDER — NITROGLYCERIN 1 MG/10 ML FOR IR/CATH LAB
INTRA_ARTERIAL | Status: AC
  Filled 2016-11-29: qty 10

## 2016-11-29 MED ORDER — PROPOFOL 10 MG/ML IV BOLUS
INTRAVENOUS | Status: DC | PRN
  Administered 2016-11-29: 100 mg via INTRAVENOUS

## 2016-11-29 MED ORDER — ORAL CARE MOUTH RINSE
15.0000 mL | OROMUCOSAL | Status: DC
  Administered 2016-11-29 – 2016-12-01 (×19): 15 mL via OROMUCOSAL

## 2016-11-29 MED ORDER — PANTOPRAZOLE SODIUM 40 MG IV SOLR
40.0000 mg | Freq: Every day | INTRAVENOUS | Status: DC
  Administered 2016-11-29 – 2016-12-01 (×3): 40 mg via INTRAVENOUS
  Filled 2016-11-29 (×2): qty 40

## 2016-11-29 MED ORDER — LIDOCAINE HCL (CARDIAC) 20 MG/ML IV SOLN
INTRAVENOUS | Status: DC | PRN
  Administered 2016-11-29: 60 mg via INTRAVENOUS

## 2016-11-29 MED ORDER — ROCURONIUM BROMIDE 100 MG/10ML IV SOLN
INTRAVENOUS | Status: DC | PRN
  Administered 2016-11-29 (×2): 50 mg via INTRAVENOUS

## 2016-11-29 MED ORDER — CHLORHEXIDINE GLUCONATE 0.12% ORAL RINSE (MEDLINE KIT)
15.0000 mL | Freq: Two times a day (BID) | OROMUCOSAL | Status: DC
  Administered 2016-11-29 – 2016-12-01 (×4): 15 mL via OROMUCOSAL

## 2016-11-29 MED ORDER — FENTANYL CITRATE (PF) 100 MCG/2ML IJ SOLN
INTRAMUSCULAR | Status: DC | PRN
  Administered 2016-11-29 (×2): 50 ug via INTRAVENOUS

## 2016-11-29 MED ORDER — SUCCINYLCHOLINE CHLORIDE 20 MG/ML IJ SOLN
INTRAMUSCULAR | Status: DC | PRN
  Administered 2016-11-29: 100 mg via INTRAVENOUS

## 2016-11-29 MED ORDER — IOPAMIDOL (ISOVUE-370) INJECTION 76%
INTRAVENOUS | Status: AC
  Filled 2016-11-29: qty 100

## 2016-11-29 MED ORDER — DIPHENHYDRAMINE HCL 50 MG/ML IJ SOLN
INTRAMUSCULAR | Status: AC
  Filled 2016-11-29: qty 1

## 2016-11-29 MED ORDER — ACETAMINOPHEN 325 MG PO TABS: 650.0000 mg | ORAL_TABLET | ORAL | Status: DC | PRN

## 2016-11-29 MED ORDER — ACETAMINOPHEN 650 MG RE SUPP
650.0000 mg | RECTAL | Status: DC | PRN
Start: 2016-11-29 — End: 2016-11-30

## 2016-11-29 MED ORDER — IOPAMIDOL (ISOVUE-300) INJECTION 61%
INTRAVENOUS | Status: AC
  Administered 2016-11-29: 100 mL
  Filled 2016-11-29: qty 300

## 2016-11-29 MED ORDER — STROKE: EARLY STAGES OF RECOVERY BOOK
Freq: Once | Status: DC
  Filled 2016-11-29: qty 1

## 2016-11-29 MED ORDER — SODIUM CHLORIDE 0.9 % IV SOLN
INTRAVENOUS | Status: DC
  Administered 2016-11-29: 16:00:00 via INTRAVENOUS

## 2016-11-29 MED ORDER — DIPHENHYDRAMINE HCL 50 MG/ML IJ SOLN
50.0000 mg | Freq: Once | INTRAMUSCULAR | Status: AC
  Administered 2016-11-29: 50 mg via INTRAVENOUS

## 2016-11-29 MED ORDER — METHYLPREDNISOLONE SODIUM SUCC 125 MG IJ SOLR
INTRAMUSCULAR | Status: AC
  Filled 2016-11-29: qty 2

## 2016-11-29 MED ORDER — ACETAMINOPHEN 325 MG PO TABS
650.0000 mg | ORAL_TABLET | ORAL | Status: DC | PRN
  Administered 2016-12-02: 650 mg via ORAL
  Filled 2016-11-29 (×2): qty 2

## 2016-11-29 NOTE — Sedation Documentation (Signed)
7 Fr. Exoseal to right groin 

## 2016-11-29 NOTE — Sedation Documentation (Signed)
Called and spoke with [facility]. Will check on bed status and call me back.

## 2016-11-29 NOTE — ED Notes (Signed)
PA AT bedside.

## 2016-11-29 NOTE — Progress Notes (Signed)
Patient ID: Valerie Mcclure, female   DOB: 10/08/1940, 76 y.o.   MRN: [id number] INR  Immediate post procedure CT reveals an approx 5cm x 2.5 cm  hyperdensity of the LT  caudate and Lt lentiform nucleus with  moderate  effacement of the Lt frontal horn due to mass effect... Likely represents mixture of blood and contrast stain.  Findings reviewed with patients spouse and daughter,and referring neurologist.. Potential for worsening neurological function in the first 24 hours discussed with  the family.  S.Naraya Stoneberg MD

## 2016-11-29 NOTE — ED Triage Notes (Signed)
Patient presents today from home with complaints of Aphasia, Right side weakness and right sided facial droop LSN 2200. Patient denies any Pain. Patient reports history of HTN and Afib and is not on any blood thinners. Per EMS patient gait was impaired this morning.

## 2016-11-29 NOTE — ED Notes (Signed)
In CT for perfusion study

## 2016-11-29 NOTE — Sedation Documentation (Signed)
Called 4N to teletrack bed to IR. Informed staff unaware of stroke pt coming to them.

## 2016-11-29 NOTE — Progress Notes (Signed)
SLP Cancellation Note  Patient Details Name: Valerie Mcclure MRN: [id number] DOB: [birth year]   Cancelled assessments secondary to procedure. Will f/u next date.         Juan Quam Laurice 11/29/2016, 1:48 PM

## 2016-11-29 NOTE — ED Notes (Signed)
Patient Belonging given to daughter.

## 2016-11-29 NOTE — Transfer of Care (Signed)
Immediate Anesthesia Transfer of Care Note  Patient: Valerie Mcclure  Procedure(s) Performed: Procedure(s): RADIOLOGY WITH ANESTHESIA (N/A)  Patient Location: PACU  Anesthesia Type:General  Level of Consciousness: Patient remains intubated per anesthesia plan  Airway & Oxygen Therapy: Patient remains intubated per anesthesia plan and Patient placed on Ventilator (see vital sign flow sheet for setting)  Post-op Assessment: Report given to RN and Post -op Vital signs reviewed and stable  Post vital signs: Reviewed and stable  Last Vitals:  Vitals:   11/29/16 1045 11/29/16 1100  BP: 127/61 129/77  Pulse: 84 79  Resp: (!) 24 16  Temp:      Last Pain:  Vitals:   11/29/16 0811  TempSrc: Oral         Complications: No apparent anesthesia complications

## 2016-11-29 NOTE — Anesthesia Procedure Notes (Signed)
Procedure Name: Intubation Date/Time: 11/29/2016 11:36 AM Performed by: Salli Quarry Vasily Fedewa Pre-anesthesia Checklist: Patient identified, Emergency Drugs available, Suction available and Patient being monitored Patient Re-evaluated:Patient Re-evaluated prior to inductionOxygen Delivery Method: Circle System Utilized Preoxygenation: Pre-oxygenation with 100% oxygen Intubation Type: IV induction, Rapid sequence and Cricoid Pressure applied Laryngoscope Size: Mac and 4 Grade View: Grade II Tube type: Subglottic suction tube Tube size: 7.5 mm Number of attempts: 1 Airway Equipment and Method: Stylet and Oral airway Placement Confirmation: ETT inserted through vocal cords under direct vision,  positive ETCO2 and breath sounds checked- equal and bilateral Secured at: 22 cm Tube secured with: Tape Dental Injury: Teeth and Oropharynx as per pre-operative assessment

## 2016-11-29 NOTE — Sedation Documentation (Signed)
Called 4N to teletrack bed. Informed bed ready but no staff to take care of pt. Will have to wait until another pt transfer out before taking stroke pt.

## 2016-11-29 NOTE — Anesthesia Postprocedure Evaluation (Signed)
Anesthesia Post Note  Patient: JAKHIA BUXTON  Procedure(s) Performed: Procedure(s) (LRB): RADIOLOGY WITH ANESTHESIA (N/A)  Patient location during evaluation: PACU Anesthesia Type: General Level of consciousness: patient remains intubated per anesthesia plan and sedated Pain management: pain level controlled Vital Signs Assessment: post-procedure vital signs reviewed and stable Respiratory status: patient remains intubated per anesthesia plan and patient on ventilator - see flowsheet for VS Cardiovascular status: blood pressure returned to baseline and stable Postop Assessment: no signs of nausea or vomiting Anesthetic complications: no       Last Vitals:  Vitals:   11/29/16 1432 11/29/16 1447  BP: 138/61 136/60  Pulse: (!) 55 (!) 55  Resp: 12 12  Temp:      Last Pain:  Vitals:   11/29/16 0811  TempSrc: Oral                 Jaymeson Mengel,W. EDMOND

## 2016-11-29 NOTE — Progress Notes (Signed)
76 year old female presented to [facility] via [facility] with stroke symptoms.   Patient LSW at 2200 on 11/28/2016 when she went to bed. This morning daughter reports hearing a fall - found patient on floor with right side flaccid - right racial droop and slurring of speech. CT scan was done and radiologist noted dense left MCA.  Code stroke was called at [phone].  Stroke team to bedside - [facility] improving per ED staff - Opal Sidles' and Dr. Stark Jock at bedside.  On arrival to ED per report patient was flaccid with right arm.  [facility] now 7 from 44.  She is alert - follows commands - speech slurred with and difficulty with word finding.  Right arm drifts.  Right facial droop.  [facility] 7. Dr. Orlena Sheldon at bedside.  To CT scan for CTA and CTP - on arrival to scan IV dye allergy was noted.  Family reports patient gets rash and has difficulty breathing.  Dr. Orlena Sheldon at bedside - speaking with Dr. Estanislado Pandy who requests patient be treated with IV Solumedrol and Benadryl - wait 20 minutes and proceed with imagining.  Patient tolerated meds and imaging well.  Restless with back discomfort from being on CT table for extended time therfore back to ED for prep for possible IR and family time  - Dr. Carlis Abbott speaking with Dr. Orlena Sheldon with CTP results.  Call to Dr. Estanislado Pandy per Dr. Orlena Sheldon - IR team notified at 1107 per Dr. Estanislado Pandy. Dr. Orlena Sheldon to bedside speaking with family and patient.  Patient prepped with bil groin shaves - second IV started and foley catheter placed.  To IR at 1120 - sleepy from meds  - handoff to [facility].  Dr. Estanislado Pandy speaking with family - consent signed.  Family to waiting area.  Handoff to [employer].

## 2016-11-29 NOTE — Sedation Documentation (Signed)
Spoke with bed control. Bed available on 4N. Not cleaned, placing request for stat clean now.

## 2016-11-29 NOTE — ED Provider Notes (Signed)
[facility] DEPT Provider Note   CSN: [id number] Arrival date & time: 11/29/16  0801     History   Chief Complaint Chief Complaint  Patient presents with  . Cerebrovascular Accident    HPI Valerie Mcclure is a 76 y.o. female.  HPI   Pt with hx HTN, HLD, hypothyroid, chronic back pain, presents with stroke-like symptoms that were first noticed this morning.  Pt was LSN 2200 yesterday.  Daughter reports she was sleeping on the couch when she heard her mother fall out of bed.  When she was helping her up she found that her right face was drooping, her speech was altered, right arm flaccid.  She takes her daily medications, no recent medication changes.  Not on blood thinners.    Level V caveat for dysarthria, difficulty communicating with patient.   Past Medical History:  Diagnosis Date  . Blood transfusion without reported diagnosis   . Chronic low back pain   . Hyperlipidemia   . Osteopenia   . Unspecified essential hypertension   . Unspecified hypothyroidism     Patient Active Problem List   Diagnosis Date Noted  . Mild aortic regurgitation 08/20/2016  . Bilateral edema of lower extremity 04/07/2016  . Pancreatic duct dilated 01/09/2016  . CKD (chronic kidney disease) 12/25/2015  . Mild tricuspid regurgitation 12/25/2015  . Mild mitral regurgitation 12/25/2015  . Prediabetes 06/10/2015  . Edema 06/04/2015  . Abdominal pain, chronic, right upper quadrant 06/04/2015  . Postmenopausal disorder 04/03/2014  . Chronic low back pain   . PAIN IN THORACIC SPINE 04/18/2010  . Coronary atherosclerosis 01/24/2009  . DEGENERATIVE JOINT DISEASE 01/23/2009  . ARTHRITIS, LEFT KNEE 12/13/2008  . HERNIATED LUMBAR DISC 12/13/2008  . Hypothyroidism 11/21/2007  . Hyperlipidemia 11/09/2007  . ANEMIA 11/09/2007  . Essential hypertension 11/09/2007  . Osteopenia 11/09/2007    Past Surgical History:  Procedure Laterality Date  . ABDOMINAL HYSTERECTOMY  1970  .  CHOLECYSTECTOMY  07/2009   Dr. Rise Patience  . COLONOSCOPY  2003  . FLEXIBLE SIGMOIDOSCOPY  2010  . HAND SURGERY    . LUMBAR LAMINECTOMY  11/2008   Done by Dr. Patrice Paradise  . THYROIDECTOMY      OB History    No data available       Home Medications    Prior to Admission medications   Medication Sig Start Date End Date Taking? Authorizing Provider  estradiol (ESTRACE) 0.5 MG tablet TAKE 1 TABLET(0.5 MG) BY MOUTH DAILY 05/20/16  Yes Burns, Claudina Lick, MD  FLUoxetine (PROZAC) 20 MG capsule Take 1 capsule (20 mg total) by mouth daily. Patient taking differently: Take 40 mg by mouth daily.  04/27/14  Yes Rowe Clack, MD  furosemide (LASIX) 20 MG tablet TAKE 1 TABLET BY MOUTH DAILY AS NEEDED 07/29/16  Yes Burns, Claudina Lick, MD  levothyroxine (SYNTHROID, LEVOTHROID) 75 MCG tablet Take 75 mcg by mouth daily before breakfast.   Yes [provider]  metoprolol tartrate (LOPRESSOR) 25 MG tablet TAKE 1 TABLET(25 MG) BY MOUTH TWICE DAILY 05/20/16  Yes Burns, Claudina Lick, MD  morphine (MS CONTIN) 15 MG 12 hr tablet Take 15 mg by mouth every 12 (twelve) hours.  04/01/16  Yes [provider]  NIFEdipine (PROCARDIA-XL/ADALAT-CC/NIFEDICAL-XL) 30 MG 24 hr tablet Take 30 mg by mouth daily.   Yes [provider]  nortriptyline (PAMELOR) 10 MG capsule TAKE 2 CAPSULES BY MOUTH AT BEDTIME 06/08/16  Yes Burns, Claudina Lick, MD  simvastatin (ZOCOR) 10 MG tablet TAKE  1 TABLET(10 MG) BY MOUTH DAILY 10/14/16  Yes Burns, Claudina Lick, MD  valsartan (DIOVAN) 160 MG tablet Take 1 tablet (160 mg total) by mouth daily. 12/25/15  Yes Burns, Claudina Lick, MD  vitamin B-12 (CYANOCOBALAMIN) 100 MCG tablet Take 100 mcg by mouth daily.   Yes [provider]  Calcium Carbonate-Vit D-Min (CALCIUM 1200 PO) Take by mouth daily.      [provider]  Cholecalciferol (VITAMIN D3) 1000 UNITS CAPS Take by mouth daily.      [provider]  levothyroxine (SYNTHROID, LEVOTHROID) 88 MCG tablet Take 1 tablet (88 mcg  total) by mouth daily. 12/30/15   [name], MD    Family History Family History  Problem Relation Age of Onset  . Heart disease Father 58       MI age 70s  . Lung cancer Brother 92  . Colon cancer Neg Hx   . Esophageal cancer Neg Hx   . Rectal cancer Neg Hx   . Stomach cancer Neg Hx     Social History Social History  Substance Use Topics  . Smoking status: Never Smoker  . Smokeless tobacco: Never Used  . Alcohol use No     Allergies   Shrimp [shellfish allergy]; Tandem plus [fefum-fepo-fa-b [id number]]; Ivp dye [iodinated diagnostic agents]; and Penicillins   Review of Systems Review of Systems  Unable to perform ROS: Other     Physical Exam Updated Vital Signs BP 129/77   Pulse 79   Temp 97.8 F (36.6 C) (Oral)   Resp 16   SpO2 100%   Physical Exam  Constitutional: She appears well-developed and well-nourished. No distress.  HENT:  Head: Normocephalic and atraumatic.  Neck: Neck supple.  Cardiovascular: Normal rate and regular rhythm.   Pulmonary/Chest: Effort normal and breath sounds normal. No respiratory distress. She has no wheezes. She has no rales.  Abdominal: Soft. She exhibits no distension. There is no tenderness. There is no rebound and no guarding.  Neurological: She is alert.  Right facial droop.  Right arm flaccid.  Slurred speech.  Raises left arm and bilateral legs.  Follows commands.    Skin: She is not diaphoretic.  Nursing note and vitals reviewed.    ED Treatments / Results  Labs (all labs ordered are listed, but only abnormal results are displayed) Labs Reviewed  CBC - Abnormal; Notable for the following:       Result Value   RBC 3.85 (*)    Hemoglobin 11.7 (*)    HCT 35.2 (*)    All other components within normal limits  COMPREHENSIVE METABOLIC PANEL - Abnormal; Notable for the following:    Glucose, Bld 141 (*)    BUN 24 (*)    Creatinine, Ser 1.49 (*)    Calcium 8.7 (*)    Albumin 3.4 (*)    GFR calc non Af  Amer 33 (*)    GFR calc Af Amer 38 (*)    All other components within normal limits  I-STAT TROPOININ, ED - Abnormal; Notable for the following:    Troponin i, poc 1.86 (*)    All other components within normal limits  CBG MONITORING, ED - Abnormal; Notable for the following:    Glucose-Capillary 121 (*)    All other components within normal limits  I-STAT CHEM 8, ED - Abnormal; Notable for the following:    Chloride 100 (*)    BUN 27 (*)    Creatinine, Ser 1.40 (*)  Glucose, Bld 139 (*)    Calcium, Ion 1.10 (*)    Hemoglobin 11.2 (*)    HCT 33.0 (*)    All other components within normal limits  PROTIME-INR  APTT  DIFFERENTIAL    EKG  EKG Interpretation None       Radiology Ct Angio Head W Or Wo Contrast  Result Date: 11/29/2016 CLINICAL DATA:  Right facial droop and slurred speech. Last seen normal 10 p.m. last night. Stroke. EXAM: CT ANGIOGRAPHY HEAD AND NECK CT PERFUSION BRAIN TECHNIQUE: Multidetector CT imaging of the head and neck was performed using the standard protocol during bolus administration of intravenous contrast. Multiplanar CT image reconstructions and MIPs were obtained to evaluate the vascular anatomy. Carotid stenosis measurements (when applicable) are obtained utilizing NASCET criteria, using the distal internal carotid diameter as the denominator. Multiphase CT imaging of the brain was performed following IV bolus contrast injection. Subsequent parametric perfusion maps were calculated using RAPID software. CONTRAST:  80 mL Isovue 370 IV COMPARISON:  CT head 11/29/2016 FINDINGS: CTA NECK FINDINGS Aortic arch: Mild atherosclerotic disease in the aortic arch. Proximal great vessels widely patent. Right carotid system: Mild atherosclerotic disease at the right carotid bifurcation. No significant carotid stenosis. Left carotid system: Noncalcified plaque along the medial wall of the left internal carotid artery with mild irregularity but no definite ulceration.  Less than 25% diameter stenosis of the left internal carotid artery. Left external carotid artery patent. Vertebral arteries: Both vertebral arteries patent to the basilar without significant stenosis. Skeleton: Moderate cervical spine degenerative change. No acute skeletal abnormality. Other neck: 10 mm rim calcified nodule in the right lobe of the thyroid, with a benign appearance. Smaller partially calcified nodule in the right upper lobe of the thyroid also likely benign. Upper chest: Mild mosaic pattern of lung density in the right lung apex. Question mild edema. Review of the MIP images confirms the above findings CTA HEAD FINDINGS Anterior circulation: Mild atherosclerotic disease in the cavernous carotid bilaterally which is calcified but non stenotic. Left M1 segment occluded. This is hyperdense on CT and appears acute. There is perfusion of the left MCA branches due to collaterals. Posterior circulation: Both vertebral arteries patent to the basilar. PICA patent bilaterally. Basilar patent. Superior cerebellar and posterior cerebral arteries patent bilaterally without stenosis. Venous sinuses: Patent Anatomic variants: None Delayed phase: Not performed Review of the MIP images confirms the above findings CT Brain Perfusion Findings: CBF (<30%) Volume: 5 mLmL. However, review of the head CT of earlier today, there is hypodensity left temporal lobe compatible with acute infarct which is larger than indicated on CT perfusion. Perfusion (Tmax>6.0s) volume: 52 mLmL Perfusion (T-max greater than 4 seconds) volume:  99 mL Mismatch Volume: 47 mL.ML. Review of the perfusion images reveals significant penumbra greater than the calculated amount involving the left temporal parietal lobe. 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 Hypodensity left lower temporal lobe compatible with acute infarct. There is significant penumbra in the left  temporoparietal lobe. Irregular noncalcified plaque left internal carotid artery could be a source of emboli or not the high non stenotic atherosclerotic disease. No significant stenosis in the carotid or vertebral arteries in the neck. Atherosclerotic calcification in the cavernous carotid bilaterally. These results were called by telephone at the time of interpretation on 11/29/2016 at 10:50 Am to Dr. Rogue Jury , who verbally acknowledged these results. Electronically Signed   By: Franchot Gallo M.D.   On: 11/29/2016  11:11   Dg Chest 2 View  Result Date: 11/29/2016 CLINICAL DATA:  Code stroke, slurred speech, right-sided droop. EXAM: CHEST  2 VIEW COMPARISON:  Chest x-ray dated 01/01/2009. FINDINGS: Stable cardiomegaly. Lungs are clear. No pleural effusion or pneumothorax seen. Atherosclerotic changes noted at the aortic arch. No acute or suspicious osseous finding. IMPRESSION: 1. No active cardiopulmonary disease. No evidence of pneumonia or pulmonary edema. 2. Stable cardiomegaly. 3. Aortic atherosclerosis. Electronically Signed   By: Franki Cabot M.D.   On: 11/29/2016 09:27   Ct Head Wo Contrast  Result Date: 11/29/2016 CLINICAL DATA:  Left facial droop.  Stroke symptoms EXAM: CT HEAD WITHOUT CONTRAST TECHNIQUE: Contiguous axial images were obtained from the base of the skull through the vertex without intravenous contrast. COMPARISON:  None. FINDINGS: Brain: Ill-defined hypodensity in the left inferior temporal lobe, suspicious for acute infarct. Negative for acute hemorrhage. Ventricle size normal. No shift of the midline structures. Vascular: Hyperdense left MCA compatible with thrombus. This correlates with the left temporal lobe infarction. Skull: Negative Sinuses/Orbits: Negative Other: None IMPRESSION: Hypodensity left temporal lobe consistent with acute infarct. Hyperdense left MCA compatible with thrombus or embolus. Aspects score 8 These results were called by telephone at the time of  interpretation on 11/29/2016 at 9:14 am to Dr. Veryl Speak , who verbally acknowledged these results. Electronically Signed   By: Franchot Gallo M.D.   On: 11/29/2016 09:17   Ct Angio Neck W Or Wo Contrast  Result Date: 11/29/2016 CLINICAL DATA:  Right facial droop and slurred speech. Last seen normal 10 p.m. last night. Stroke. EXAM: CT ANGIOGRAPHY HEAD AND NECK CT PERFUSION BRAIN TECHNIQUE: Multidetector CT imaging of the head and neck was performed using the standard protocol during bolus administration of intravenous contrast. Multiplanar CT image reconstructions and MIPs were obtained to evaluate the vascular anatomy. Carotid stenosis measurements (when applicable) are obtained utilizing NASCET criteria, using the distal internal carotid diameter as the denominator. Multiphase CT imaging of the brain was performed following IV bolus contrast injection. Subsequent parametric perfusion maps were calculated using RAPID software. CONTRAST:  80 mL Isovue 370 IV COMPARISON:  CT head 11/29/2016 FINDINGS: CTA NECK FINDINGS Aortic arch: Mild atherosclerotic disease in the aortic arch. Proximal great vessels widely patent. Right carotid system: Mild atherosclerotic disease at the right carotid bifurcation. No significant carotid stenosis. Left carotid system: Noncalcified plaque along the medial wall of the left internal carotid artery with mild irregularity but no definite ulceration. Less than 25% diameter stenosis of the left internal carotid artery. Left external carotid artery patent. Vertebral arteries: Both vertebral arteries patent to the basilar without significant stenosis. Skeleton: Moderate cervical spine degenerative change. No acute skeletal abnormality. Other neck: 10 mm rim calcified nodule in the right lobe of the thyroid, with a benign appearance. Smaller partially calcified nodule in the right upper lobe of the thyroid also likely benign. Upper chest: Mild mosaic pattern of lung density in the  right lung apex. Question mild edema. Review of the MIP images confirms the above findings CTA HEAD FINDINGS Anterior circulation: Mild atherosclerotic disease in the cavernous carotid bilaterally which is calcified but non stenotic. Left M1 segment occluded. This is hyperdense on CT and appears acute. There is perfusion of the left MCA branches due to collaterals. Posterior circulation: Both vertebral arteries patent to the basilar. PICA patent bilaterally. Basilar patent. Superior cerebellar and posterior cerebral arteries patent bilaterally without stenosis. Venous sinuses: Patent Anatomic variants: None Delayed phase: Not performed Review of the  MIP images confirms the above findings CT Brain Perfusion Findings: CBF (<30%) Volume: 5 mLmL. However, review of the head CT of earlier today, there is hypodensity left temporal lobe compatible with acute infarct which is larger than indicated on CT perfusion. Perfusion (Tmax>6.0s) volume: 52 mLmL Perfusion (T-max greater than 4 seconds) volume:  99 mL Mismatch Volume: 47 mL.ML. Review of the perfusion images reveals significant penumbra greater than the calculated amount involving the left temporal parietal lobe. 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 Hypodensity left lower temporal lobe compatible with acute infarct. There is significant penumbra in the left temporoparietal lobe. Irregular noncalcified plaque left internal carotid artery could be a source of emboli or not the high non stenotic atherosclerotic disease. No significant stenosis in the carotid or vertebral arteries in the neck. Atherosclerotic calcification in the cavernous carotid bilaterally. These results were called by telephone at the time of interpretation on 11/29/2016 at 10:50 Am to Dr. Rogue Jury , who verbally acknowledged these results. Electronically Signed   By: Franchot Gallo M.D.   On: 11/29/2016 11:11   Ct  Cerebral Perfusion W Contrast  Result Date: 11/29/2016 CLINICAL DATA:  Right facial droop and slurred speech. Last seen normal 10 p.m. last night. Stroke. EXAM: CT ANGIOGRAPHY HEAD AND NECK CT PERFUSION BRAIN TECHNIQUE: Multidetector CT imaging of the head and neck was performed using the standard protocol during bolus administration of intravenous contrast. Multiplanar CT image reconstructions and MIPs were obtained to evaluate the vascular anatomy. Carotid stenosis measurements (when applicable) are obtained utilizing NASCET criteria, using the distal internal carotid diameter as the denominator. Multiphase CT imaging of the brain was performed following IV bolus contrast injection. Subsequent parametric perfusion maps were calculated using RAPID software. CONTRAST:  80 mL Isovue 370 IV COMPARISON:  CT head 11/29/2016 FINDINGS: CTA NECK FINDINGS Aortic arch: Mild atherosclerotic disease in the aortic arch. Proximal great vessels widely patent. Right carotid system: Mild atherosclerotic disease at the right carotid bifurcation. No significant carotid stenosis. Left carotid system: Noncalcified plaque along the medial wall of the left internal carotid artery with mild irregularity but no definite ulceration. Less than 25% diameter stenosis of the left internal carotid artery. Left external carotid artery patent. Vertebral arteries: Both vertebral arteries patent to the basilar without significant stenosis. Skeleton: Moderate cervical spine degenerative change. No acute skeletal abnormality. Other neck: 10 mm rim calcified nodule in the right lobe of the thyroid, with a benign appearance. Smaller partially calcified nodule in the right upper lobe of the thyroid also likely benign. Upper chest: Mild mosaic pattern of lung density in the right lung apex. Question mild edema. Review of the MIP images confirms the above findings CTA HEAD FINDINGS Anterior circulation: Mild atherosclerotic disease in the cavernous  carotid bilaterally which is calcified but non stenotic. Left M1 segment occluded. This is hyperdense on CT and appears acute. There is perfusion of the left MCA branches due to collaterals. Posterior circulation: Both vertebral arteries patent to the basilar. PICA patent bilaterally. Basilar patent. Superior cerebellar and posterior cerebral arteries patent bilaterally without stenosis. Venous sinuses: Patent Anatomic variants: None Delayed phase: Not performed Review of the MIP images confirms the above findings CT Brain Perfusion Findings: CBF (<30%) Volume: 5 mLmL. However, review of the head CT of earlier today, there is hypodensity left temporal lobe compatible with acute infarct which is larger than indicated on CT perfusion. Perfusion (Tmax>6.0s) volume: 52 mLmL Perfusion (T-max greater than 4  seconds) volume:  99 mL Mismatch Volume: 47 mL.ML. Review of the perfusion images reveals significant penumbra greater than the calculated amount involving the left temporal parietal lobe. 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 Hypodensity left lower temporal lobe compatible with acute infarct. There is significant penumbra in the left temporoparietal lobe. Irregular noncalcified plaque left internal carotid artery could be a source of emboli or not the high non stenotic atherosclerotic disease. No significant stenosis in the carotid or vertebral arteries in the neck. Atherosclerotic calcification in the cavernous carotid bilaterally. These results were called by telephone at the time of interpretation on 11/29/2016 at 10:50 Am to Dr. Rogue Jury , who verbally acknowledged these results. Electronically Signed   By: Franchot Gallo M.D.   On: 11/29/2016 11:11    Procedures Procedures (including critical care time)  Medications Ordered in ED Medications  iopamidol (ISOVUE-370) 76 % injection (not administered)  diphenhydrAMINE (BENADRYL)  50 MG/ML injection (not administered)  methylPREDNISolone sodium succinate (SOLU-MEDROL) 125 mg/2 mL injection (not administered)  iopamidol (ISOVUE-300) 61 % injection (not administered)  vancomycin (VANCOCIN) 1-5 GM/200ML-% IVPB (not administered)  nitroGLYCERIN 100 MCG/ML intra-arterial injection (not administered)  eptifibatide (INTEGRILIN) 20 MG/10ML injection (not administered)  methylPREDNISolone sodium succinate (SOLU-MEDROL) 125 mg/2 mL injection 125 mg (125 mg Intravenous Given 11/29/16 1008)  diphenhydrAMINE (BENADRYL) injection 50 mg (50 mg Intravenous Given 11/29/16 1008)     Initial Impression / Assessment and Plan / ED Course  I have reviewed the triage vital signs and the nursing notes.  Pertinent labs & imaging results that were available during my care of the patient were reviewed by me and considered in my medical decision making (see chart for details).  Clinical Course as of Nov 30 1206  Sun Nov 29, 2016  0854 Discussed pt with Dr Stark Jock.   [EW]  7782 Pt Dr Stark Jock, call received by him from radiology who recommends calling code stroke.  Code Stroke initiated by Dr Stark Jock.    [EW]    Clinical Course User Index [EW] Clayton Bibles, [place]    Pt brought in by EMS after fall out of bed and found right facial droop, right arm weak, dysarthric.  CT demonstrates acute stroke - code stroke called by Dr Stark Jock as above.  Pt seen and orders/dispo managed therafter by Dr Orlena Sheldon, neurohospitalist.  Please see his note for further details.    Final Clinical Impressions(s) / ED Diagnoses   Final diagnoses:  Cerebral infarction, unspecified mechanism [facility])    New Prescriptions New Prescriptions   No medications on file     Clayton Bibles, Hershal Coria 11/29/16 1208    Veryl Speak, MD 11/29/16 1312

## 2016-11-29 NOTE — ED Notes (Signed)
Patient transported to CT 

## 2016-11-29 NOTE — Sedation Documentation (Signed)
Bedside report given to [facility], RN in PACU. Dressing and pulses checked.

## 2016-11-29 NOTE — Procedures (Signed)
S/P bilateral common carotid arteriograms followed  By complete revascularization of occluded LT MCA  With x1 pass with 4,mm x 40 mm Solitaure FR retriever device achieving a TICI 3 reperfusion Spasm post treatment of M1 treated with IA nitroglycerin with positive response.

## 2016-11-29 NOTE — ED Notes (Signed)
Patient in CT, Neuro advised to pre-medicate patient wait 20 minutes and then complete profusion study.

## 2016-11-29 NOTE — Progress Notes (Signed)
  Patient ID: Valerie Mcclure, female   DOB: [birth year], 76 y.o.   MRN: [id number] INR   Patient is a 76 yr old Rt handed female  LSW approx 10 last night.Family found her with altered speech and Rt sided weakness. Initial CT brain neg for hemorrhage.Aspects  Score 8 CTA  Lt MCA M1 occlusion  With mod  Collaterals. CT perfusion  CBF < 30 % 81ml.  Tmax > 6 sec  52 ml with mismatch of 47 and ratio of 10.  Option of endovascular revascularization to prevent further neurologic damage ,and potentially improve recovery  discussed with spouse and daughter. Risks of hemorrhage of 10 % and worsening neurologic injury and vent dependency, and inability to revascularize  all reviewed in detail. Informed witnessed consent obtained for endovascular treatment.Arlean Hopping MD

## 2016-11-29 NOTE — Consult Note (Signed)
Reason for Consult: Code stroke Referring Physician: ER  Valerie Mcclure is an 76 y.o. female.  HPI: LSW at 10 pm last night.  This morning she woke up with aphasia and right hemiplegia.  CT showed hyperdense left MCA and hypodensity in the left temporal area.  CTA confirmed left MCA clot.  CTP showed CBF < 30% at 5 ml and Tmax >6 sec at 52 ml for a mismatch vol of 47 ml and ratio of 10.  She has a history of HTN and high cholesterol, but no DM, smoking.    Past Medical History:  Diagnosis Date  . Blood transfusion without reported diagnosis   . Chronic low back pain   . Hyperlipidemia   . Osteopenia   . Unspecified essential hypertension   . Unspecified hypothyroidism     Past Surgical History:  Procedure Laterality Date  . ABDOMINAL HYSTERECTOMY  1970  . CHOLECYSTECTOMY  07/2009   Dr. Rise Patience  . COLONOSCOPY  2003  . FLEXIBLE SIGMOIDOSCOPY  2010  . HAND SURGERY    . LUMBAR LAMINECTOMY  11/2008   Done by Dr. Patrice Paradise  . THYROIDECTOMY      Family History  Problem Relation Age of Onset  . Heart disease Father 42       MI age 37s  . Lung cancer Brother 53  . Colon cancer Neg Hx   . Esophageal cancer Neg Hx   . Rectal cancer Neg Hx   . Stomach cancer Neg Hx     Social History:  reports that she has never smoked. She has never used smokeless tobacco. She reports that she does not drink alcohol or use drugs.  Allergies:  Allergies  Allergen Reactions  . Shrimp [Shellfish Allergy]     Break outs and swelling   . Tandem Plus [Fefum-Fepo-Fa-B Cmp-C-Zn-Mn-Cu] Nausea And Vomiting  . Ivp Dye [Iodinated Diagnostic Agents] Rash    itching  . Penicillins Rash    itching    Prior to Admission medications   Medication Sig Start Date End Date Taking? Authorizing Provider  estradiol (ESTRACE) 0.5 MG tablet TAKE 1 TABLET(0.5 MG) BY MOUTH DAILY 05/20/16  Yes Burns, Claudina Lick, MD  FLUoxetine (PROZAC) 20 MG capsule Take 1 capsule (20 mg total) by mouth daily. Patient taking  differently: Take 40 mg by mouth daily.  04/27/14  Yes Rowe Clack, MD  furosemide (LASIX) 20 MG tablet TAKE 1 TABLET BY MOUTH DAILY AS NEEDED 07/29/16  Yes Burns, Claudina Lick, MD  levothyroxine (SYNTHROID, LEVOTHROID) 75 MCG tablet Take 75 mcg by mouth daily before breakfast.   Yes [provider]  metoprolol tartrate (LOPRESSOR) 25 MG tablet TAKE 1 TABLET(25 MG) BY MOUTH TWICE DAILY 05/20/16  Yes Burns, Claudina Lick, MD  morphine (MS CONTIN) 15 MG 12 hr tablet Take 15 mg by mouth every 12 (twelve) hours.  04/01/16  Yes [provider]  NIFEdipine (PROCARDIA-XL/ADALAT-CC/NIFEDICAL-XL) 30 MG 24 hr tablet Take 30 mg by mouth daily.   Yes [provider]  nortriptyline (PAMELOR) 10 MG capsule TAKE 2 CAPSULES BY MOUTH AT BEDTIME 06/08/16  Yes Burns, Claudina Lick, MD  simvastatin (ZOCOR) 10 MG tablet TAKE 1 TABLET(10 MG) BY MOUTH DAILY 10/14/16  Yes Burns, Claudina Lick, MD  valsartan (DIOVAN) 160 MG tablet Take 1 tablet (160 mg total) by mouth daily. 12/25/15  Yes Burns, Claudina Lick, MD  vitamin B-12 (CYANOCOBALAMIN) 100 MCG tablet Take 100 mcg by mouth daily.   Yes [provider]  Calcium  Carbonate-Vit D-Min (CALCIUM 1200 PO) Take by mouth daily.      [provider]  Cholecalciferol (VITAMIN D3) 1000 UNITS CAPS Take by mouth daily.      [provider]  levothyroxine (SYNTHROID, LEVOTHROID) 88 MCG tablet Take 1 tablet (88 mcg total) by mouth daily. 12/30/15   [name], MD    Medications: Prior to Admission:  (Not in a hospital admission)  Results for orders placed or performed during the hospital encounter of 11/29/16 (from the past 48 hour(s))  Protime-INR     Status: None   Collection Time: 11/29/16  8:34 AM  Result Value Ref Range   Prothrombin Time 13.3 11.4 - 15.2 seconds   INR 1.01   APTT     Status: None   Collection Time: 11/29/16  8:34 AM  Result Value Ref Range   aPTT 29 24 - 36 seconds  CBC     Status: Abnormal   Collection Time: 11/29/16   8:34 AM  Result Value Ref Range   WBC 6.3 4.0 - 10.5 K/uL   RBC 3.85 (L) 3.87 - 5.11 MIL/uL   Hemoglobin 11.7 (L) 12.0 - 15.0 g/dL   HCT 35.2 (L) 36.0 - 46.0 %   MCV 91.4 78.0 - 100.0 fL   MCH 30.4 26.0 - 34.0 pg   MCHC 33.2 30.0 - 36.0 g/dL   RDW 14.2 11.5 - 15.5 %   Platelets 204 150 - 400 K/uL  Differential     Status: None   Collection Time: 11/29/16  8:34 AM  Result Value Ref Range   Neutrophils Relative % 83 %   Neutro Abs 5.3 1.7 - 7.7 K/uL   Lymphocytes Relative 14 %   Lymphs Abs 0.9 0.7 - 4.0 K/uL   Monocytes Relative 3 %   Monocytes Absolute 0.2 0.1 - 1.0 K/uL   Eosinophils Relative 0 %   Eosinophils Absolute 0.0 0.0 - 0.7 K/uL   Basophils Relative 0 %   Basophils Absolute 0.0 0.0 - 0.1 K/uL  Comprehensive metabolic panel     Status: Abnormal   Collection Time: 11/29/16  8:34 AM  Result Value Ref Range   Sodium 137 135 - 145 mmol/L   Potassium 3.7 3.5 - 5.1 mmol/L   Chloride 102 101 - 111 mmol/L   CO2 27 22 - 32 mmol/L   Glucose, Bld 141 (H) 65 - 99 mg/dL   BUN 24 (H) 6 - 20 mg/dL   Creatinine, Ser 1.49 (H) 0.44 - 1.00 mg/dL   Calcium 8.7 (L) 8.9 - 10.3 mg/dL   Total Protein 6.5 6.5 - 8.1 g/dL   Albumin 3.4 (L) 3.5 - 5.0 g/dL   AST 29 15 - 41 U/L   ALT 17 14 - 54 U/L   Alkaline Phosphatase 56 38 - 126 U/L   Total Bilirubin 0.3 0.3 - 1.2 mg/dL   GFR calc non Af Amer 33 (L) >60 mL/min   GFR calc Af Amer 38 (L) >60 mL/min    Comment: (NOTE) The eGFR has been calculated using the CKD EPI equation. This calculation has not been validated in all clinical situations. eGFR's persistently <60 mL/min signify possible Chronic Kidney Disease.    Anion gap 8 5 - 15  I-stat troponin, ED     Status: Abnormal   Collection Time: 11/29/16  8:40 AM  Result Value Ref Range   Troponin i, poc 1.86 (HH) 0.00 - 0.08 ng/mL   Comment NOTIFIED PHYSICIAN    Comment  3            Comment: Due to the release kinetics of cTnI, a negative result within the first hours of the onset  of symptoms does not rule out myocardial infarction with certainty. If myocardial infarction is still suspected, repeat the test at appropriate intervals.   I-Stat Chem 8, ED     Status: Abnormal   Collection Time: 11/29/16  8:42 AM  Result Value Ref Range   Sodium 139 135 - 145 mmol/L   Potassium 3.7 3.5 - 5.1 mmol/L   Chloride 100 (L) 101 - 111 mmol/L   BUN 27 (H) 6 - 20 mg/dL   Creatinine, Ser 1.40 (H) 0.44 - 1.00 mg/dL   Glucose, Bld 139 (H) 65 - 99 mg/dL   Calcium, Ion 1.10 (L) 1.15 - 1.40 mmol/L   TCO2 27 0 - 100 mmol/L   Hemoglobin 11.2 (L) 12.0 - 15.0 g/dL   HCT 33.0 (L) 36.0 - 46.0 %  CBG monitoring, ED     Status: Abnormal   Collection Time: 11/29/16  8:45 AM  Result Value Ref Range   Glucose-Capillary 121 (H) 65 - 99 mg/dL    Dg Chest 2 View  Result Date: 11/29/2016 CLINICAL DATA:  Code stroke, slurred speech, right-sided droop. EXAM: CHEST  2 VIEW COMPARISON:  Chest x-ray dated 01/01/2009. FINDINGS: Stable cardiomegaly. Lungs are clear. No pleural effusion or pneumothorax seen. Atherosclerotic changes noted at the aortic arch. No acute or suspicious osseous finding. IMPRESSION: 1. No active cardiopulmonary disease. No evidence of pneumonia or pulmonary edema. 2. Stable cardiomegaly. 3. Aortic atherosclerosis. Electronically Signed   By: Franki Cabot M.D.   On: 11/29/2016 09:27   Ct Head Wo Contrast  Result Date: 11/29/2016 CLINICAL DATA:  Left facial droop.  Stroke symptoms EXAM: CT HEAD WITHOUT CONTRAST TECHNIQUE: Contiguous axial images were obtained from the base of the skull through the vertex without intravenous contrast. COMPARISON:  None. FINDINGS: Brain: Ill-defined hypodensity in the left inferior temporal lobe, suspicious for acute infarct. Negative for acute hemorrhage. Ventricle size normal. No shift of the midline structures. Vascular: Hyperdense left MCA compatible with thrombus. This correlates with the left temporal lobe infarction. Skull: Negative  Sinuses/Orbits: Negative Other: None IMPRESSION: Hypodensity left temporal lobe consistent with acute infarct. Hyperdense left MCA compatible with thrombus or embolus. Aspects score 8 These results were called by telephone at the time of interpretation on 11/29/2016 at 9:14 am to Dr. Veryl Speak , who verbally acknowledged these results. Electronically Signed   By: Franchot Gallo M.D.   On: 11/29/2016 09:17    ROS Blood pressure (!) 144/71, pulse 80, temperature 97.8 F (36.6 C), temperature source Oral, resp. rate 15, SpO2 100 %. Neurologic Examination:   Awake, alert. Non-fluent, C/N/R- impaired  Right facial droop. RUE and RLE 2/5  Right babinski  Assessment/Plan:  Acute left MCA distribution ischemic infarct about 13 hours ago at most with resultant aphasia and right hemiplegia.  Perfusion studies show a salvageable ischemic penumbra.  I discussed with Dr. Estanislado Pandy and he is in agreement to proceed.  Spoke with family as well.  Post-op, she will be admitted to neuro ICU.  Rogue Jury, MD 11/29/2016, 11:02 AM

## 2016-11-29 NOTE — Anesthesia Preprocedure Evaluation (Signed)
Anesthesia Evaluation  Patient identified by MRN, date of birth, ID band Patient awake    Reviewed: Allergy & Precautions, H&P , NPO status , Patient's Chart, lab work & pertinent test results  Airway Mallampati: III  TM Distance: >3 FB Neck ROM: Full    Dental no notable dental hx. (+) Teeth Intact, Dental Advisory Given   Pulmonary neg pulmonary ROS,    Pulmonary exam normal breath sounds clear to auscultation       Cardiovascular hypertension, Pt. on medications  Rhythm:Regular Rate:Normal     Neuro/Psych CVA, Residual Symptoms negative psych ROS   GI/Hepatic negative GI ROS, Neg liver ROS,   Endo/Other  Hypothyroidism   Renal/GU negative Renal ROS  negative genitourinary   Musculoskeletal  (+) Arthritis , Osteoarthritis,    Abdominal   Peds  Hematology negative hematology ROS (+) anemia ,   Anesthesia Other Findings   Reproductive/Obstetrics negative OB ROS                             Anesthesia Physical Anesthesia Plan  ASA: III and emergent  Anesthesia Plan: General   Post-op Pain Management:    Induction: Intravenous, Rapid sequence and Cricoid pressure planned  Airway Management Planned: Oral ETT  Additional Equipment:   Intra-op Plan:   Post-operative Plan: Post-operative intubation/ventilation  Informed Consent: I have reviewed the patients History and Physical, chart, labs and discussed the procedure including the risks, benefits and alternatives for the proposed anesthesia with the patient or authorized representative who has indicated his/her understanding and acceptance.   Dental advisory given  Plan Discussed with: CRNA  Anesthesia Plan Comments:         Anesthesia Quick Evaluation

## 2016-11-29 NOTE — Sedation Documentation (Signed)
Spoke with bed control. Bed cleaned and ready.

## 2016-11-30 ENCOUNTER — Encounter (HOSPITAL_COMMUNITY): Payer: Self-pay

## 2016-11-30 ENCOUNTER — Inpatient Hospital Stay (HOSPITAL_COMMUNITY): Payer: Medicare Other

## 2016-11-30 DIAGNOSIS — J9601 Acute respiratory failure with hypoxia: Secondary | ICD-10-CM

## 2016-11-30 DIAGNOSIS — E876 Hypokalemia: Secondary | ICD-10-CM

## 2016-11-30 DIAGNOSIS — I63512 Cerebral infarction due to unspecified occlusion or stenosis of left middle cerebral artery: Secondary | ICD-10-CM

## 2016-11-30 DIAGNOSIS — G9340 Encephalopathy, unspecified: Secondary | ICD-10-CM

## 2016-11-30 LAB — CBC WITH DIFFERENTIAL/PLATELET
Basophils Absolute: 0 10*3/uL (ref 0.0–0.1)
Basophils Relative: 0 %
EOS PCT: 0 %
Eosinophils Absolute: 0 10*3/uL (ref 0.0–0.7)
HCT: 37.6 % (ref 36.0–46.0)
HEMOGLOBIN: 12.6 g/dL (ref 12.0–15.0)
LYMPHS ABS: 1.4 10*3/uL (ref 0.7–4.0)
LYMPHS PCT: 7 %
MCH: 30.4 pg (ref 26.0–34.0)
MCHC: 33.5 g/dL (ref 30.0–36.0)
MCV: 90.8 fL (ref 78.0–100.0)
Monocytes Absolute: 0.6 10*3/uL (ref 0.1–1.0)
Monocytes Relative: 3 %
NEUTROS ABS: 18.8 10*3/uL — AB (ref 1.7–7.7)
Neutrophils Relative %: 90 %
PLATELETS: 281 10*3/uL (ref 150–400)
RBC: 4.14 MIL/uL (ref 3.87–5.11)
RDW: 14.7 % (ref 11.5–15.5)
WBC: 20.7 10*3/uL — ABNORMAL HIGH (ref 4.0–10.5)

## 2016-11-30 LAB — BASIC METABOLIC PANEL
Anion gap: 13 (ref 5–15)
BUN: 19 mg/dL (ref 6–20)
CHLORIDE: 106 mmol/L (ref 101–111)
CO2: 22 mmol/L (ref 22–32)
Calcium: 8.2 mg/dL — ABNORMAL LOW (ref 8.9–10.3)
Creatinine, Ser: 1.26 mg/dL — ABNORMAL HIGH (ref 0.44–1.00)
GFR calc Af Amer: 46 mL/min — ABNORMAL LOW (ref >60)
GFR calc non Af Amer: 40 mL/min — ABNORMAL LOW (ref >60)
Glucose, Bld: 150 mg/dL — ABNORMAL HIGH (ref 65–99)
POTASSIUM: 3.5 mmol/L (ref 3.5–5.1)
Sodium: 141 mmol/L (ref 135–145)

## 2016-11-30 LAB — LIPID PANEL
CHOL/HDL RATIO: 3.9 ratio
Cholesterol: 162 mg/dL (ref 0–200)
HDL: 42 mg/dL (ref >40)
LDL Cholesterol: 85 mg/dL (ref 0–99)
Triglycerides: 175 mg/dL — ABNORMAL HIGH (ref <150)
VLDL: 35 mg/dL (ref 0–40)

## 2016-11-30 LAB — TSH: TSH: 1.076 u[IU]/mL (ref 0.350–4.500)

## 2016-11-30 LAB — T4, FREE: FREE T4: 1.36 ng/dL — AB (ref 0.61–1.12)

## 2016-11-30 LAB — MRSA PCR SCREENING: MRSA BY PCR: NEGATIVE

## 2016-11-30 MED ORDER — LEVOTHYROXINE SODIUM 100 MCG IV SOLR
37.5000 ug | Freq: Every day | INTRAVENOUS | Status: DC
  Administered 2016-11-30 – 2016-12-01 (×2): 37.5 ug via INTRAVENOUS
  Filled 2016-11-30 (×2): qty 5

## 2016-11-30 MED ORDER — POTASSIUM CHLORIDE 20 MEQ/15ML (10%) PO SOLN
40.0000 meq | Freq: Three times a day (TID) | ORAL | Status: AC
  Administered 2016-11-30: 40 meq
  Filled 2016-11-30 (×2): qty 30

## 2016-11-30 MED ORDER — MORPHINE SULFATE (PF) 4 MG/ML IV SOLN
INTRAVENOUS | Status: AC
  Filled 2016-11-30: qty 1

## 2016-11-30 MED ORDER — MORPHINE SULFATE (PF) 4 MG/ML IV SOLN
2.0000 mg | INTRAVENOUS | Status: DC | PRN
  Administered 2016-12-01: 1 mg via INTRAVENOUS
  Administered 2016-12-01 – 2016-12-02 (×2): 2 mg via INTRAVENOUS
  Filled 2016-11-30 (×3): qty 1

## 2016-11-30 NOTE — Progress Notes (Signed)
Referring Physician(s):  Dr. Orlena Sheldon  Supervising Physician: Luanne Bras  Patient Status:  [facility] - In-pt  Chief Complaint:  Left MCA CVA  Subjective: Intubated and sedated but alert.  Able to move left side, but limited movement on the right with both upper and lower extremities.  Family at bedside.   Allergies: Shrimp [shellfish allergy]; Tandem plus [fefum-fepo-fa-b [id number]]; Ivp dye [iodinated diagnostic agents]; and Penicillins  Medications: Prior to Admission medications   Medication Sig Start Date End Date Taking? Authorizing Provider  estradiol (ESTRACE) 0.5 MG tablet TAKE 1 TABLET(0.5 MG) BY MOUTH DAILY 05/20/16  Yes Burns, Claudina Lick, MD  FLUoxetine (PROZAC) 20 MG capsule Take 1 capsule (20 mg total) by mouth daily. Patient taking differently: Take 40 mg by mouth daily.  04/27/14  Yes Rowe Clack, MD  furosemide (LASIX) 20 MG tablet TAKE 1 TABLET BY MOUTH DAILY AS NEEDED 07/29/16  Yes Burns, Claudina Lick, MD  levothyroxine (SYNTHROID, LEVOTHROID) 75 MCG tablet Take 75 mcg by mouth daily before breakfast.   Yes [provider]  metoprolol tartrate (LOPRESSOR) 25 MG tablet TAKE 1 TABLET(25 MG) BY MOUTH TWICE DAILY 05/20/16  Yes Burns, Claudina Lick, MD  morphine (MS CONTIN) 15 MG 12 hr tablet Take 15 mg by mouth every 12 (twelve) hours.  04/01/16  Yes [provider]  NIFEdipine (PROCARDIA-XL/ADALAT-CC/NIFEDICAL-XL) 30 MG 24 hr tablet Take 30 mg by mouth daily.   Yes [provider]  nortriptyline (PAMELOR) 10 MG capsule TAKE 2 CAPSULES BY MOUTH AT BEDTIME 06/08/16  Yes Burns, Claudina Lick, MD  simvastatin (ZOCOR) 10 MG tablet TAKE 1 TABLET(10 MG) BY MOUTH DAILY 10/14/16  Yes Burns, Claudina Lick, MD  valsartan (DIOVAN) 160 MG tablet Take 1 tablet (160 mg total) by mouth daily. 12/25/15  Yes Burns, Claudina Lick, MD  vitamin B-12 (CYANOCOBALAMIN) 100 MCG tablet Take 100 mcg by mouth daily.   Yes [provider]  Calcium Carbonate-Vit D-Min (CALCIUM 1200  PO) Take by mouth daily.      [provider]  Cholecalciferol (VITAMIN D3) 1000 UNITS CAPS Take by mouth daily.      [provider]  levothyroxine (SYNTHROID, LEVOTHROID) 88 MCG tablet Take 1 tablet (88 mcg total) by mouth daily. 12/30/15   [name], MD     Vital Signs: BP (!) 131/48   Pulse 89   Temp 98.1 F (36.7 C) (Axillary)   Resp 16   Ht 5\' 3"  (1.6 m)   Wt 175 lb 4.3 oz (79.5 kg)   SpO2 100%   BMI 31.05 kg/m   Physical Exam  Cardiovascular: Normal rate and regular rhythm.   Pulmonary/Chest:  intubated  Neurological:  Awakens to voice. Open eyes and tracks.  Able to move left side; raises arm and leg, + L hand grip. Does not perfectly follow commands likely due to in some part to ongoing sedation. Limited/minimal movement on right side.   Skin: Skin is warm and dry.  Groin intact.  No evidence of hematoma or pseudoaneurysm  Nursing note and vitals reviewed.   Imaging: Ct Angio Head W Or Wo Contrast  Result Date: 11/29/2016 CLINICAL DATA:  Right facial droop and slurred speech. Last seen normal 10 p.m. last night. Stroke. EXAM: CT ANGIOGRAPHY HEAD AND NECK CT PERFUSION BRAIN TECHNIQUE: Multidetector CT imaging of the head and neck was performed using the standard protocol during bolus administration of intravenous contrast. Multiplanar CT image reconstructions and MIPs were obtained to evaluate the vascular anatomy.  Carotid stenosis measurements (when applicable) are obtained utilizing NASCET criteria, using the distal internal carotid diameter as the denominator. Multiphase CT imaging of the brain was performed following IV bolus contrast injection. Subsequent parametric perfusion maps were calculated using RAPID software. CONTRAST:  80 mL Isovue 370 IV COMPARISON:  CT head 11/29/2016 FINDINGS: CTA NECK FINDINGS Aortic arch: Mild atherosclerotic disease in the aortic arch. Proximal great vessels widely patent. Right carotid system: Mild atherosclerotic  disease at the right carotid bifurcation. No significant carotid stenosis. Left carotid system: Noncalcified plaque along the medial wall of the left internal carotid artery with mild irregularity but no definite ulceration. Less than 25% diameter stenosis of the left internal carotid artery. Left external carotid artery patent. Vertebral arteries: Both vertebral arteries patent to the basilar without significant stenosis. Skeleton: Moderate cervical spine degenerative change. No acute skeletal abnormality. Other neck: 10 mm rim calcified nodule in the right lobe of the thyroid, with a benign appearance. Smaller partially calcified nodule in the right upper lobe of the thyroid also likely benign. Upper chest: Mild mosaic pattern of lung density in the right lung apex. Question mild edema. Review of the MIP images confirms the above findings CTA HEAD FINDINGS Anterior circulation: Mild atherosclerotic disease in the cavernous carotid bilaterally which is calcified but non stenotic. Left M1 segment occluded. This is hyperdense on CT and appears acute. There is perfusion of the left MCA branches due to collaterals. Posterior circulation: Both vertebral arteries patent to the basilar. PICA patent bilaterally. Basilar patent. Superior cerebellar and posterior cerebral arteries patent bilaterally without stenosis. Venous sinuses: Patent Anatomic variants: None Delayed phase: Not performed Review of the MIP images confirms the above findings CT Brain Perfusion Findings: CBF (<30%) Volume: 5 mLmL. However, review of the head CT of earlier today, there is hypodensity left temporal lobe compatible with acute infarct which is larger than indicated on CT perfusion. Perfusion (Tmax>6.0s) volume: 52 mLmL Perfusion (T-max greater than 4 seconds) volume:  99 mL Mismatch Volume: 47 mL.ML. Review of the perfusion images reveals significant penumbra greater than the calculated amount involving the left temporal parietal lobe.  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 Hypodensity left lower temporal lobe compatible with acute infarct. There is significant penumbra in the left temporoparietal lobe. Irregular noncalcified plaque left internal carotid artery could be a source of emboli or not the high non stenotic atherosclerotic disease. No significant stenosis in the carotid or vertebral arteries in the neck. Atherosclerotic calcification in the cavernous carotid bilaterally. These results were called by telephone at the time of interpretation on 11/29/2016 at 10:50 Am to Dr. Rogue Jury , who verbally acknowledged these results. Electronically Signed   By: Franchot Gallo M.D.   On: 11/29/2016 11:11   Dg Chest 2 View  Result Date: 11/29/2016 CLINICAL DATA:  Code stroke, slurred speech, right-sided droop. EXAM: CHEST  2 VIEW COMPARISON:  Chest x-ray dated 01/01/2009. FINDINGS: Stable cardiomegaly. Lungs are clear. No pleural effusion or pneumothorax seen. Atherosclerotic changes noted at the aortic arch. No acute or suspicious osseous finding. IMPRESSION: 1. No active cardiopulmonary disease. No evidence of pneumonia or pulmonary edema. 2. Stable cardiomegaly. 3. Aortic atherosclerosis. Electronically Signed   By: Franki Cabot M.D.   On: 11/29/2016 09:27   Ct Head Wo Contrast  Result Date: 11/29/2016 CLINICAL DATA:  Stroke post thrombectomy EXAM: CT HEAD WITHOUT CONTRAST TECHNIQUE: Contiguous axial images were obtained from the base of the skull through the  vertex without intravenous contrast. COMPARISON:  CT head 11/29/2016 FINDINGS: Brain: High-density within the left putamen and left caudate compatible with acute hemorrhage. There is also high density in the superior basal ganglia compatible with extravasation of contrast. The high density area is most likely a mixture of blood and contrast. This area measures approximately 6.1 x 3.5 x 3.7 cm, acute blood  volume 39 mL. Hemorrhagic transformation of left temporal lobe infarct. Compression of the left lateral ventricle due to mass-effect. 6 mm midline shift to the right. Interval development of subdural hemorrhage along the tentorium bilaterally. Vascular: Normal arterial and venous enhancement post angiography. Skull: Negative for fracture. Sinuses/Orbits: Mucosal edema in the paranasal sinuses. Normal orbit. Other: None IMPRESSION: Large area of hemorrhage and extravasated contrast in the left basal ganglia, blood volume 39 mL. Hemorrhagic transformation of left temporal infarct. Mild amount of subdural hematoma along the tentorium bilaterally. 6 mm midline shift to the right. These results were called by telephone at the time of interpretation on 11/29/2016 at 2:20 pm to Dr. Luanne Bras , who verbally acknowledged these results. Electronically Signed   By: Franchot Gallo M.D.   On: 11/29/2016 14:21   Ct Head Wo Contrast  Result Date: 11/29/2016 CLINICAL DATA:  Left facial droop.  Stroke symptoms EXAM: CT HEAD WITHOUT CONTRAST TECHNIQUE: Contiguous axial images were obtained from the base of the skull through the vertex without intravenous contrast. COMPARISON:  None. FINDINGS: Brain: Ill-defined hypodensity in the left inferior temporal lobe, suspicious for acute infarct. Negative for acute hemorrhage. Ventricle size normal. No shift of the midline structures. Vascular: Hyperdense left MCA compatible with thrombus. This correlates with the left temporal lobe infarction. Skull: Negative Sinuses/Orbits: Negative Other: None IMPRESSION: Hypodensity left temporal lobe consistent with acute infarct. Hyperdense left MCA compatible with thrombus or embolus. Aspects score 8 These results were called by telephone at the time of interpretation on 11/29/2016 at 9:14 am to Dr. Veryl Speak , who verbally acknowledged these results. Electronically Signed   By: Franchot Gallo M.D.   On: 11/29/2016 09:17   Ct Angio Neck  W Or Wo Contrast  Result Date: 11/29/2016 CLINICAL DATA:  Right facial droop and slurred speech. Last seen normal 10 p.m. last night. Stroke. EXAM: CT ANGIOGRAPHY HEAD AND NECK CT PERFUSION BRAIN TECHNIQUE: Multidetector CT imaging of the head and neck was performed using the standard protocol during bolus administration of intravenous contrast. Multiplanar CT image reconstructions and MIPs were obtained to evaluate the vascular anatomy. Carotid stenosis measurements (when applicable) are obtained utilizing NASCET criteria, using the distal internal carotid diameter as the denominator. Multiphase CT imaging of the brain was performed following IV bolus contrast injection. Subsequent parametric perfusion maps were calculated using RAPID software. CONTRAST:  80 mL Isovue 370 IV COMPARISON:  CT head 11/29/2016 FINDINGS: CTA NECK FINDINGS Aortic arch: Mild atherosclerotic disease in the aortic arch. Proximal great vessels widely patent. Right carotid system: Mild atherosclerotic disease at the right carotid bifurcation. No significant carotid stenosis. Left carotid system: Noncalcified plaque along the medial wall of the left internal carotid artery with mild irregularity but no definite ulceration. Less than 25% diameter stenosis of the left internal carotid artery. Left external carotid artery patent. Vertebral arteries: Both vertebral arteries patent to the basilar without significant stenosis. Skeleton: Moderate cervical spine degenerative change. No acute skeletal abnormality. Other neck: 10 mm rim calcified nodule in the right lobe of the thyroid, with a benign appearance. Smaller partially calcified nodule in the right upper  lobe of the thyroid also likely benign. Upper chest: Mild mosaic pattern of lung density in the right lung apex. Question mild edema. Review of the MIP images confirms the above findings CTA HEAD FINDINGS Anterior circulation: Mild atherosclerotic disease in the cavernous carotid  bilaterally which is calcified but non stenotic. Left M1 segment occluded. This is hyperdense on CT and appears acute. There is perfusion of the left MCA branches due to collaterals. Posterior circulation: Both vertebral arteries patent to the basilar. PICA patent bilaterally. Basilar patent. Superior cerebellar and posterior cerebral arteries patent bilaterally without stenosis. Venous sinuses: Patent Anatomic variants: None Delayed phase: Not performed Review of the MIP images confirms the above findings CT Brain Perfusion Findings: CBF (<30%) Volume: 5 mLmL. However, review of the head CT of earlier today, there is hypodensity left temporal lobe compatible with acute infarct which is larger than indicated on CT perfusion. Perfusion (Tmax>6.0s) volume: 52 mLmL Perfusion (T-max greater than 4 seconds) volume:  99 mL Mismatch Volume: 47 mL.ML. Review of the perfusion images reveals significant penumbra greater than the calculated amount involving the left temporal parietal lobe. 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 Hypodensity left lower temporal lobe compatible with acute infarct. There is significant penumbra in the left temporoparietal lobe. Irregular noncalcified plaque left internal carotid artery could be a source of emboli or not the high non stenotic atherosclerotic disease. No significant stenosis in the carotid or vertebral arteries in the neck. Atherosclerotic calcification in the cavernous carotid bilaterally. These results were called by telephone at the time of interpretation on 11/29/2016 at 10:50 Am to Dr. Rogue Jury , who verbally acknowledged these results. Electronically Signed   By: Franchot Gallo M.D.   On: 11/29/2016 11:11   Ct Cerebral Perfusion W Contrast  Result Date: 11/29/2016 CLINICAL DATA:  Right facial droop and slurred speech. Last seen normal 10 p.m. last night. Stroke. EXAM: CT ANGIOGRAPHY HEAD AND NECK  CT PERFUSION BRAIN TECHNIQUE: Multidetector CT imaging of the head and neck was performed using the standard protocol during bolus administration of intravenous contrast. Multiplanar CT image reconstructions and MIPs were obtained to evaluate the vascular anatomy. Carotid stenosis measurements (when applicable) are obtained utilizing NASCET criteria, using the distal internal carotid diameter as the denominator. Multiphase CT imaging of the brain was performed following IV bolus contrast injection. Subsequent parametric perfusion maps were calculated using RAPID software. CONTRAST:  80 mL Isovue 370 IV COMPARISON:  CT head 11/29/2016 FINDINGS: CTA NECK FINDINGS Aortic arch: Mild atherosclerotic disease in the aortic arch. Proximal great vessels widely patent. Right carotid system: Mild atherosclerotic disease at the right carotid bifurcation. No significant carotid stenosis. Left carotid system: Noncalcified plaque along the medial wall of the left internal carotid artery with mild irregularity but no definite ulceration. Less than 25% diameter stenosis of the left internal carotid artery. Left external carotid artery patent. Vertebral arteries: Both vertebral arteries patent to the basilar without significant stenosis. Skeleton: Moderate cervical spine degenerative change. No acute skeletal abnormality. Other neck: 10 mm rim calcified nodule in the right lobe of the thyroid, with a benign appearance. Smaller partially calcified nodule in the right upper lobe of the thyroid also likely benign. Upper chest: Mild mosaic pattern of lung density in the right lung apex. Question mild edema. Review of the MIP images confirms the above findings CTA HEAD FINDINGS Anterior circulation: Mild atherosclerotic disease in the cavernous carotid bilaterally which is calcified but non stenotic. Left  M1 segment occluded. This is hyperdense on CT and appears acute. There is perfusion of the left MCA branches due to collaterals.  Posterior circulation: Both vertebral arteries patent to the basilar. PICA patent bilaterally. Basilar patent. Superior cerebellar and posterior cerebral arteries patent bilaterally without stenosis. Venous sinuses: Patent Anatomic variants: None Delayed phase: Not performed Review of the MIP images confirms the above findings CT Brain Perfusion Findings: CBF (<30%) Volume: 5 mLmL. However, review of the head CT of earlier today, there is hypodensity left temporal lobe compatible with acute infarct which is larger than indicated on CT perfusion. Perfusion (Tmax>6.0s) volume: 52 mLmL Perfusion (T-max greater than 4 seconds) volume:  99 mL Mismatch Volume: 47 mL.ML. Review of the perfusion images reveals significant penumbra greater than the calculated amount involving the left temporal parietal lobe. 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 Hypodensity left lower temporal lobe compatible with acute infarct. There is significant penumbra in the left temporoparietal lobe. Irregular noncalcified plaque left internal carotid artery could be a source of emboli or not the high non stenotic atherosclerotic disease. No significant stenosis in the carotid or vertebral arteries in the neck. Atherosclerotic calcification in the cavernous carotid bilaterally. These results were called by telephone at the time of interpretation on 11/29/2016 at 10:50 Am to Dr. Rogue Jury , who verbally acknowledged these results. Electronically Signed   By: Franchot Gallo M.D.   On: 11/29/2016 11:11   Dg Chest Port 1 View  Result Date: 11/30/2016 CLINICAL DATA:  Confirm orogastric tube placement. EXAM: PORTABLE CHEST 1 VIEW COMPARISON:  Chest radiograph earlier this day at 0907 hour FINDINGS: Endotracheal tube is 2.3 cm from the carina. Enteric tube in place, tip and side-port below the diaphragm. Low lung volumes. Borderline cardiomegaly. No pulmonary edema. No large  pleural effusion. No pneumothorax. IMPRESSION: Endotracheal tube approximately 2.3 cm from the carina. Enteric tube in place. Borderline cardiomegaly.  No new abnormality. Electronically Signed   By: Jeb Levering M.D.   On: 11/30/2016 00:28   Dg Abd Portable 1v  Result Date: 11/30/2016 CLINICAL DATA:  76 y/o  F; enteric tube placement. EXAM: PORTABLE ABDOMEN - 1 VIEW COMPARISON:  None. FINDINGS: Nonobstructive bowel gas pattern. Enteric tube tip projects over the stomach. Advanced lumbar levocurvature and lower lumbar fusion changes. IMPRESSION: Enteric tube tip projects over the mid stomach. Electronically Signed   By: Kristine Garbe M.D.   On: 11/30/2016 00:26   Ir Percutaneous Art Thrombectomy/infusion Intracranial Inc Diag Angio  Result Date: 11/30/2016 INDICATION: Patient presenting with aphasia and right-sided weakness with left gaze preference. 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 CBF < 30 % 5 ml,Tmax > 6 secs of 52 ml with a mismatch vol of 32ml. EXAM: 1. EMERGENT LARGE VESSEL OCCLUSION THROMBOLYSIS (anterior CIRCULATION) 2. COMPARISON:  CT angiogram of the head and neck, and CT perfusion study of 11/29/2016. MEDICATIONS: Vancomycin 1.5 mg IV was administered within 1 hour of the procedure. ANESTHESIA/SEDATION: General anesthesia. CONTRAST:  Isovue 300 approximately 75 cc. FLUOROSCOPY TIME:  Fluoroscopy Time: 28 minutes 36 seconds (1539 mGy). COMPLICATIONS: None immediate. TECHNIQUE: Following a full explanation of the procedure along with the potential associated complications, an informed witnessed consent was obtained. The risks of intracranial hemorrhage of 10%, worsening neurological deficit, ventilator dependency, death and inability to revascularize were all reviewed in detail with the patient's .  The patient was then put under general anesthesia by the Department of  Anesthesiology at [facility]. The right groin was prepped and draped in the usual sterile fashion. Thereafter using modified Seldinger technique, transfemoral access into the right common femoral artery was obtained without difficulty. Over a 0.035 inch guidewire a 5 French Pinnacle sheath was inserted. Through this, and also over a 0.035 inch guidewire a 5 [place] JB 1 catheter was advanced to the aortic arch region and selectively positioned in the right common carotid artery and the left common carotid artery, and the innominate artery. FINDINGS: The right subclavian arteriogram demonstrates the right vertebral artery to opacify normally to the cranial skull base. Flow is noted into the right vertebrobasilar junction and the right posterior-inferior cerebellar artery. Flash opacification of the proximal basilar artery is also noted. The right common carotid arteriogram demonstrates the right external carotid artery and its major branches to be widely patent. The right internal carotid artery at the bulb to the cranial skull base opacifies widely. The petrous, the cavernous and the supraclinoid segments are widely patent. The right middle cerebral artery and the right anterior cerebral artery opacify into the capillary and venous phases. Flash opacification via the anterior communicating artery of the left anterior cerebral artery A2 segment is also noted. Also noted is contrast blush in the region of the middle terminate of the right maxillary sinus most consistent with inflammatory reaction. The left common carotid arteriogram demonstrates the left external carotid artery and its major branches to be widely patent. The left internal carotid artery at the bulb to the cranial skull base opacifies widely. There are mild FMD-like changes noted in the mid left internal carotid artery mid cervical segment. The petrous, the cavernous and the supraclinoid segments demonstrate wide patency. A left posterior  communicating artery is seen opacifying the left posterior cerebral artery distribution. The left anterior cerebral artery opacifies into the capillary and venous phases with flash filling via the anterior communicating artery of the right anterior cerebral artery A2 and A1 segments. The left middle cerebral artery demonstrates complete angiographic occlusion in its mid M1 segment with delayed opacification of an anterior temporal branch with prominent filling defects proximally. The M1 segment, otherwise, remains completely occluded. The delayed arterial phase in the lateral projection demonstrates partial retrograde opacification of the distal perisylvian branches with large area of hypoperfusion involving the lentiform nucleus and the caudate head. PROCEDURE: ENDOVASCULAR COMPLETE REVASCULARIZATION OF OCCLUDED LEFT MIDDLE CEREBRAL ARTERY M1 SEGMENT The diagnostic JB 1 catheter in the left common carotid artery was then exchanged over a 0.035 inch 300 cm Rosen exchange guidewire for an 8 French 55 cm Brite tip neurovascular sheath using biplane roadmap technique and constant fluoroscopic guidance. Good aspiration was obtained from the side port of the neurovascular sheath. This was then connected to continuous heparinized saline infusion. Over the [employer] guidewire, an 8 [place] 85 cm FlowGate balloon guide catheter which had been prepped with 50% contrast and 50% heparinized saline infusion was advanced and positioned in the distal left common carotid artery. The guidewire was removed. Good aspiration was obtained from the hub of the 8 [place] FlowGate guide catheter. A gentle contrast injection demonstrated no evidence of spasms, dissections or of intraluminal filling defects. At this time, using biplane roadmap technique and constant fluoroscopic guidance, over a 0.035 inch Roadrunner guidewire, the 8 [place] FlowGate guide catheter was then advanced to the distal cervical left ICA without difficulty. Good  aspiration was obtained after removal  of the wire. A gentle control arteriogram demonstrated mild spasm with no impediment of distal flow. Patient was treated with 1 aliquots of 25 mics of nitroglycerin with relief of the vasospasm. At this time, in a coaxial manner and with constant heparinized saline infusion using biplane roadmap technique and constant fluoroscopic guidance, a Trevo ProVue 021 microcatheter was advanced over a 0.014 inch Softip Synchro micro guidewire to the distal end of the FlowGate guide catheter. With the micro guidewire leading with a J-tip configuration the combination was navigated to the supraclinoid right ICA. A torque device was then used to manipulate the guidewire into the left middle cerebral artery followed by the microcatheter. The micro guidewire was then advanced without difficulty into the M2 M3 region of the inferior division of left middle cerebral artery followed by the microcatheter. The guidewire was removed. Good aspiration was obtained from the hub of the microcatheter. A gentle contrast injection demonstrated antegrade flow of contrast. At this time, a 4 mm x 40 mm Solitaire FR retrieval device was advanced in a coaxial manner and with constant heparinized saline infusion using biplane roadmap technique and constant fluoroscopic guidance to the distal portion of the microcatheter. At this time the entire system was straightened by loosening the O ring on the delivery microcatheter. After having ascertained the distal and the proximal positioning of the retrieval device, with slight forward gentle traction with the right hand on the delivery micro guidewire, with the left hand the microcatheter was retrieved unsheathing the entire Solitaire FR retrieval device with the tip of the microcatheter just proximal to the proximal landing zone of the retrieval device. A control arteriogram performed through the 8 [place] FlowGate guide catheter demonstrated significantly improved  caliber and flow through the middle cerebral artery distribution with a narrowing of the retrieval device in the distal M1 segment. At this time, the balloon of the [facility] guide catheter was inflated in the distal left internal carotid artery for proximal flow arrest. The proximal portion of the retrieval device was captured into the microcatheter. There on after whilst aspirating with a 60 mL syringe at the hub of the [facility] guide catheter, the combination of the retrieval device and the microcatheter were gently retrieved and removed. The aspirate demonstrated prominent clot measuring approximately 2 mm x 4 mm. Aspiration was continued as the balloon was deflated in the left internal carotid artery. The FlowGate guide catheter was gently retrieved more proximally after deflation of the balloon. A control arteriogram performed through the 8 [place] FlowGate guide catheter demonstrated complete angiographic revascularization of the left occluded left middle cerebral artery with patency of the left posterior cerebral and left anterior cerebral arteries. Focal segmental spasm was noted of the left middle cerebral artery involving the inferior division in the distal M1 region. This responded to 4 aliquots of 25 mics of nitroglycerin intra-arterially. A final control arteriogram performed demonstrated significant improved caliber of the previously noted vasospasm of the left middle cerebral artery, and also the left internal carotid artery mid cervical segment. Also noted of the final to control arteriograms a focal area of hyper density in the left centrum semiovale region. No evidence of angiographic extravasation or mass effect was seen. Patient's hemodynamic status, blood pressure and heart rate remained stable throughout the procedure. The 8 [place] FlowGate guide catheter and the 8 [place] neurovascular sheath were then retrieved into the abdominal aorta and exchanged over a J-tip guidewire for an 8 French  Pinnacle sheath which in turn was successfully removed  with the application of a closure device. The right groin showed no evidence of a hematoma or hemorrhage. The distal pulses in both feet remained Dopplerable unchanged from prior to the procedure. IMPRESSION: Status post endovascular complete revascularization of occluded left middle cerebral artery with 1 pass with the Solitaire FR 4 mm x 40 mm retrieval device, with achievement of a TICI 3 reperfusion. Groin puncture to initial reperfusion TICI2b 32 minutes. Groin puncture to TICI reperfusion 38 minutes PLAN: The patient was transferred to the CT scanner for postprocedural CT scan of the brain. Electronically Signed   By: Luanne Bras M.D.   On: 11/29/2016 14:42   Ir Angio Intra Extracran Sel Com Carotid Innominate Uni L Mod Sed  Result Date: 11/30/2016 INDICATION: Patient presenting with aphasia and right-sided weakness with left gaze preference. 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 CBF < 30 % 5 ml,Tmax > 6 secs of 52 ml with a mismatch vol of 55ml. EXAM: 1. EMERGENT LARGE VESSEL OCCLUSION THROMBOLYSIS (anterior CIRCULATION) 2. COMPARISON:  CT angiogram of the head and neck, and CT perfusion study of 11/29/2016. MEDICATIONS: Vancomycin 1.5 mg IV was administered within 1 hour of the procedure. ANESTHESIA/SEDATION: General anesthesia. CONTRAST:  Isovue 300 approximately 75 cc. FLUOROSCOPY TIME:  Fluoroscopy Time: 28 minutes 36 seconds (1539 mGy). COMPLICATIONS: None immediate. TECHNIQUE: Following a full explanation of the procedure along with the potential associated complications, an informed witnessed consent was obtained. The risks of intracranial hemorrhage of 10%, worsening neurological deficit, ventilator dependency, death and inability to revascularize were all reviewed in detail with the patient's . The patient was then put under general  anesthesia by the Department of Anesthesiology at [facility]. The right groin was prepped and draped in the usual sterile fashion. Thereafter using modified Seldinger technique, transfemoral access into the right common femoral artery was obtained without difficulty. Over a 0.035 inch guidewire a 5 French Pinnacle sheath was inserted. Through this, and also over a 0.035 inch guidewire a 5 [place] JB 1 catheter was advanced to the aortic arch region and selectively positioned in the right common carotid artery and the left common carotid artery, and the innominate artery. FINDINGS: The right subclavian arteriogram demonstrates the right vertebral artery to opacify normally to the cranial skull base. Flow is noted into the right vertebrobasilar junction and the right posterior-inferior cerebellar artery. Flash opacification of the proximal basilar artery is also noted. The right common carotid arteriogram demonstrates the right external carotid artery and its major branches to be widely patent. The right internal carotid artery at the bulb to the cranial skull base opacifies widely. The petrous, the cavernous and the supraclinoid segments are widely patent. The right middle cerebral artery and the right anterior cerebral artery opacify into the capillary and venous phases. Flash opacification via the anterior communicating artery of the left anterior cerebral artery A2 segment is also noted. Also noted is contrast blush in the region of the middle terminate of the right maxillary sinus most consistent with inflammatory reaction. The left common carotid arteriogram demonstrates the left external carotid artery and its major branches to be widely patent. The left internal carotid artery at the bulb to the cranial skull base opacifies widely. There are mild FMD-like changes noted in the mid left internal carotid artery mid cervical segment. The petrous, the cavernous and the supraclinoid segments demonstrate wide  patency. A left posterior  communicating artery is seen opacifying the left posterior cerebral artery distribution. The left anterior cerebral artery opacifies into the capillary and venous phases with flash filling via the anterior communicating artery of the right anterior cerebral artery A2 and A1 segments. The left middle cerebral artery demonstrates complete angiographic occlusion in its mid M1 segment with delayed opacification of an anterior temporal branch with prominent filling defects proximally. The M1 segment, otherwise, remains completely occluded. The delayed arterial phase in the lateral projection demonstrates partial retrograde opacification of the distal perisylvian branches with large area of hypoperfusion involving the lentiform nucleus and the caudate head. PROCEDURE: ENDOVASCULAR COMPLETE REVASCULARIZATION OF OCCLUDED LEFT MIDDLE CEREBRAL ARTERY M1 SEGMENT The diagnostic JB 1 catheter in the left common carotid artery was then exchanged over a 0.035 inch 300 cm Rosen exchange guidewire for an 8 French 55 cm Brite tip neurovascular sheath using biplane roadmap technique and constant fluoroscopic guidance. Good aspiration was obtained from the side port of the neurovascular sheath. This was then connected to continuous heparinized saline infusion. Over the [employer] guidewire, an 8 [place] 85 cm FlowGate balloon guide catheter which had been prepped with 50% contrast and 50% heparinized saline infusion was advanced and positioned in the distal left common carotid artery. The guidewire was removed. Good aspiration was obtained from the hub of the 8 [place] FlowGate guide catheter. A gentle contrast injection demonstrated no evidence of spasms, dissections or of intraluminal filling defects. At this time, using biplane roadmap technique and constant fluoroscopic guidance, over a 0.035 inch Roadrunner guidewire, the 8 [place] FlowGate guide catheter was then advanced to the distal cervical left ICA  without difficulty. Good aspiration was obtained after removal of the wire. A gentle control arteriogram demonstrated mild spasm with no impediment of distal flow. Patient was treated with 1 aliquots of 25 mics of nitroglycerin with relief of the vasospasm. At this time, in a coaxial manner and with constant heparinized saline infusion using biplane roadmap technique and constant fluoroscopic guidance, a Trevo ProVue 021 microcatheter was advanced over a 0.014 inch Softip Synchro micro guidewire to the distal end of the FlowGate guide catheter. With the micro guidewire leading with a J-tip configuration the combination was navigated to the supraclinoid right ICA. A torque device was then used to manipulate the guidewire into the left middle cerebral artery followed by the microcatheter. The micro guidewire was then advanced without difficulty into the M2 M3 region of the inferior division of left middle cerebral artery followed by the microcatheter. The guidewire was removed. Good aspiration was obtained from the hub of the microcatheter. A gentle contrast injection demonstrated antegrade flow of contrast. At this time, a 4 mm x 40 mm Solitaire FR retrieval device was advanced in a coaxial manner and with constant heparinized saline infusion using biplane roadmap technique and constant fluoroscopic guidance to the distal portion of the microcatheter. At this time the entire system was straightened by loosening the O ring on the delivery microcatheter. After having ascertained the distal and the proximal positioning of the retrieval device, with slight forward gentle traction with the right hand on the delivery micro guidewire, with the left hand the microcatheter was retrieved unsheathing the entire Solitaire FR retrieval device with the tip of the microcatheter just proximal to the proximal landing zone of the retrieval device. A control arteriogram performed through the 8 [place] FlowGate guide catheter  demonstrated significantly improved caliber and flow through the middle cerebral artery distribution with a narrowing of the retrieval  device in the distal M1 segment. At this time, the balloon of the [facility] guide catheter was inflated in the distal left internal carotid artery for proximal flow arrest. The proximal portion of the retrieval device was captured into the microcatheter. There on after whilst aspirating with a 60 mL syringe at the hub of the [facility] guide catheter, the combination of the retrieval device and the microcatheter were gently retrieved and removed. The aspirate demonstrated prominent clot measuring approximately 2 mm x 4 mm. Aspiration was continued as the balloon was deflated in the left internal carotid artery. The FlowGate guide catheter was gently retrieved more proximally after deflation of the balloon. A control arteriogram performed through the 8 [place] FlowGate guide catheter demonstrated complete angiographic revascularization of the left occluded left middle cerebral artery with patency of the left posterior cerebral and left anterior cerebral arteries. Focal segmental spasm was noted of the left middle cerebral artery involving the inferior division in the distal M1 region. This responded to 4 aliquots of 25 mics of nitroglycerin intra-arterially. A final control arteriogram performed demonstrated significant improved caliber of the previously noted vasospasm of the left middle cerebral artery, and also the left internal carotid artery mid cervical segment. Also noted of the final to control arteriograms a focal area of hyper density in the left centrum semiovale region. No evidence of angiographic extravasation or mass effect was seen. Patient's hemodynamic status, blood pressure and heart rate remained stable throughout the procedure. The 8 [place] FlowGate guide catheter and the 8 [place] neurovascular sheath were then retrieved into the abdominal aorta and exchanged over a  J-tip guidewire for an 8 French Pinnacle sheath which in turn was successfully removed with the application of a closure device. The right groin showed no evidence of a hematoma or hemorrhage. The distal pulses in both feet remained Dopplerable unchanged from prior to the procedure. IMPRESSION: Status post endovascular complete revascularization of occluded left middle cerebral artery with 1 pass with the Solitaire FR 4 mm x 40 mm retrieval device, with achievement of a TICI 3 reperfusion. Groin puncture to initial reperfusion TICI2b 32 minutes. Groin puncture to TICI reperfusion 38 minutes PLAN: The patient was transferred to the CT scanner for postprocedural CT scan of the brain. Electronically Signed   By: Luanne Bras M.D.   On: 11/29/2016 14:42   Ir Angio Vertebral Sel Subclavian Innominate Uni R Mod Sed  Result Date: 11/30/2016 INDICATION: Patient presenting with aphasia and right-sided weakness with left gaze preference. 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 CBF < 30 % 5 ml,Tmax > 6 secs of 52 ml with a mismatch vol of 24ml. EXAM: 1. EMERGENT LARGE VESSEL OCCLUSION THROMBOLYSIS (anterior CIRCULATION) 2. COMPARISON:  CT angiogram of the head and neck, and CT perfusion study of 11/29/2016. MEDICATIONS: Vancomycin 1.5 mg IV was administered within 1 hour of the procedure. ANESTHESIA/SEDATION: General anesthesia. CONTRAST:  Isovue 300 approximately 75 cc. FLUOROSCOPY TIME:  Fluoroscopy Time: 28 minutes 36 seconds (1539 mGy). COMPLICATIONS: None immediate. TECHNIQUE: Following a full explanation of the procedure along with the potential associated complications, an informed witnessed consent was obtained. The risks of intracranial hemorrhage of 10%, worsening neurological deficit, ventilator dependency, death and inability to revascularize were all reviewed in detail with the patient's . The patient  was then put under general anesthesia by the Department of Anesthesiology at [facility]. The right groin was  prepped and draped in the usual sterile fashion. Thereafter using modified Seldinger technique, transfemoral access into the right common femoral artery was obtained without difficulty. Over a 0.035 inch guidewire a 5 [place] Pinnacle sheath was inserted. Through this, and also over a 0.035 inch guidewire a 5 [place] JB 1 catheter was advanced to the aortic arch region and selectively positioned in the right common carotid artery and the left common carotid artery, and the innominate artery. FINDINGS: The right subclavian arteriogram demonstrates the right vertebral artery to opacify normally to the cranial skull base. Flow is noted into the right vertebrobasilar junction and the right posterior-inferior cerebellar artery. Flash opacification of the proximal basilar artery is also noted. The right common carotid arteriogram demonstrates the right external carotid artery and its major branches to be widely patent. The right internal carotid artery at the bulb to the cranial skull base opacifies widely. The petrous, the cavernous and the supraclinoid segments are widely patent. The right middle cerebral artery and the right anterior cerebral artery opacify into the capillary and venous phases. Flash opacification via the anterior communicating artery of the left anterior cerebral artery A2 segment is also noted. Also noted is contrast blush in the region of the middle terminate of the right maxillary sinus most consistent with inflammatory reaction. The left common carotid arteriogram demonstrates the left external carotid artery and its major branches to be widely patent. The left internal carotid artery at the bulb to the cranial skull base opacifies widely. There are mild FMD-like changes noted in the mid left internal carotid artery mid cervical segment. The petrous, the cavernous and the  supraclinoid segments demonstrate wide patency. A left posterior communicating artery is seen opacifying the left posterior cerebral artery distribution. The left anterior cerebral artery opacifies into the capillary and venous phases with flash filling via the anterior communicating artery of the right anterior cerebral artery A2 and A1 segments. The left middle cerebral artery demonstrates complete angiographic occlusion in its mid M1 segment with delayed opacification of an anterior temporal branch with prominent filling defects proximally. The M1 segment, otherwise, remains completely occluded. The delayed arterial phase in the lateral projection demonstrates partial retrograde opacification of the distal perisylvian branches with large area of hypoperfusion involving the lentiform nucleus and the caudate head. PROCEDURE: ENDOVASCULAR COMPLETE REVASCULARIZATION OF OCCLUDED LEFT MIDDLE CEREBRAL ARTERY M1 SEGMENT The diagnostic JB 1 catheter in the left common carotid artery was then exchanged over a 0.035 inch 300 cm Rosen exchange guidewire for an 8 [place] 55 cm Brite tip neurovascular sheath using biplane roadmap technique and constant fluoroscopic guidance. Good aspiration was obtained from the side port of the neurovascular sheath. This was then connected to continuous heparinized saline infusion. Over the [employer] guidewire, an 8 [place] 85 cm FlowGate balloon guide catheter which had been prepped with 50% contrast and 50% heparinized saline infusion was advanced and positioned in the distal left common carotid artery. The guidewire was removed. Good aspiration was obtained from the hub of the 8 [place] FlowGate guide catheter. A gentle contrast injection demonstrated no evidence of spasms, dissections or of intraluminal filling defects. At this time, using biplane roadmap technique and constant fluoroscopic guidance, over a 0.035 inch Roadrunner guidewire, the 8 [place] FlowGate guide catheter was then  advanced to the distal cervical left ICA without difficulty. Good aspiration was obtained after removal of the wire. A gentle control arteriogram demonstrated mild spasm with no impediment of distal flow. Patient was treated with 1 aliquots  of 25 mics of nitroglycerin with relief of the vasospasm. At this time, in a coaxial manner and with constant heparinized saline infusion using biplane roadmap technique and constant fluoroscopic guidance, a Trevo ProVue 021 microcatheter was advanced over a 0.014 inch Softip Synchro micro guidewire to the distal end of the FlowGate guide catheter. With the micro guidewire leading with a J-tip configuration the combination was navigated to the supraclinoid right ICA. A torque device was then used to manipulate the guidewire into the left middle cerebral artery followed by the microcatheter. The micro guidewire was then advanced without difficulty into the M2 M3 region of the inferior division of left middle cerebral artery followed by the microcatheter. The guidewire was removed. Good aspiration was obtained from the hub of the microcatheter. A gentle contrast injection demonstrated antegrade flow of contrast. At this time, a 4 mm x 40 mm Solitaire FR retrieval device was advanced in a coaxial manner and with constant heparinized saline infusion using biplane roadmap technique and constant fluoroscopic guidance to the distal portion of the microcatheter. At this time the entire system was straightened by loosening the O ring on the delivery microcatheter. After having ascertained the distal and the proximal positioning of the retrieval device, with slight forward gentle traction with the right hand on the delivery micro guidewire, with the left hand the microcatheter was retrieved unsheathing the entire Solitaire FR retrieval device with the tip of the microcatheter just proximal to the proximal landing zone of the retrieval device. A control arteriogram performed through the 8  [place] FlowGate guide catheter demonstrated significantly improved caliber and flow through the middle cerebral artery distribution with a narrowing of the retrieval device in the distal M1 segment. At this time, the balloon of the [facility] guide catheter was inflated in the distal left internal carotid artery for proximal flow arrest. The proximal portion of the retrieval device was captured into the microcatheter. There on after whilst aspirating with a 60 mL syringe at the hub of the [facility] guide catheter, the combination of the retrieval device and the microcatheter were gently retrieved and removed. The aspirate demonstrated prominent clot measuring approximately 2 mm x 4 mm. Aspiration was continued as the balloon was deflated in the left internal carotid artery. The FlowGate guide catheter was gently retrieved more proximally after deflation of the balloon. A control arteriogram performed through the 8 [place] FlowGate guide catheter demonstrated complete angiographic revascularization of the left occluded left middle cerebral artery with patency of the left posterior cerebral and left anterior cerebral arteries. Focal segmental spasm was noted of the left middle cerebral artery involving the inferior division in the distal M1 region. This responded to 4 aliquots of 25 mics of nitroglycerin intra-arterially. A final control arteriogram performed demonstrated significant improved caliber of the previously noted vasospasm of the left middle cerebral artery, and also the left internal carotid artery mid cervical segment. Also noted of the final to control arteriograms a focal area of hyper density in the left centrum semiovale region. No evidence of angiographic extravasation or mass effect was seen. Patient's hemodynamic status, blood pressure and heart rate remained stable throughout the procedure. The 8 [place] FlowGate guide catheter and the 8 [place] neurovascular sheath were then retrieved into the  abdominal aorta and exchanged over a J-tip guidewire for an 8 [place] Pinnacle sheath which in turn was successfully removed with the application of a closure device. The right groin showed no evidence of a hematoma or hemorrhage. The distal pulses in  both feet remained Dopplerable unchanged from prior to the procedure. IMPRESSION: Status post endovascular complete revascularization of occluded left middle cerebral artery with 1 pass with the Solitaire FR 4 mm x 40 mm retrieval device, with achievement of a TICI 3 reperfusion. Groin puncture to initial reperfusion TICI2b 32 minutes. Groin puncture to TICI reperfusion 38 minutes PLAN: The patient was transferred to the CT scanner for postprocedural CT scan of the brain. Electronically Signed   By: Luanne Bras M.D.   On: 11/29/2016 14:42    Labs:  CBC:  Recent Labs  12/25/15 1005 04/07/16 1524 11/29/16 0834 11/29/16 0842 11/30/16 0329  WBC 7.2 10.6* 6.3  --  20.7*  HGB 11.9* 12.8 11.7* 11.2* 12.6  HCT 35.1* 38.0 35.2* 33.0* 37.6  PLT 286.0 289.0 204  --  281    COAGS:  Recent Labs  11/29/16 0834  INR 1.01  APTT 29    BMP:  Recent Labs  04/07/16 1524 07/31/16 1119 11/29/16 0834 11/29/16 0842 11/30/16 0329  NA 138 140 137 139 141  K 4.1 4.1 3.7 3.7 3.5  CL 98 100 102 100* 106  CO2 35* 32 27  --  22  GLUCOSE 92 106* 141* 139* 150*  BUN 28* 28* 24* 27* 19  CALCIUM 9.2 9.5 8.7*  --  8.2*  CREATININE 1.64* 1.40* 1.49* 1.40* 1.26*  GFRNONAA  --   --  33*  --  40*  GFRAA  --   --  38*  --  46*    LIVER FUNCTION TESTS:  Recent Labs  12/25/15 1005 04/07/16 1524 07/31/16 1119 11/29/16 0834  BILITOT 0.3 0.4 0.4 0.3  AST 18 17 21 29   ALT 14 13 16 17   ALKPHOS 62 78 71 56  PROT 7.4 7.5 7.5 6.5  ALBUMIN 3.9 4.2 4.4 3.4*    Assessment and Plan: Left MCA Patient s/p reavscularization of occluded left MCA with Dr. Estanislado Pandy yesterday afternoon.  Sheath has been removed. Groin site intact today.  Patient appears  more awake and alert on vent, but still with limited movement on the right side.  CCM and Neurology managing.  IR to follow.   Electronically Signed: Docia Barrier 11/30/2016, 10:22 AM   I spent a total of 15 Minutes at the the patient's bedside AND on the patient's hospital floor or unit, greater than 50% of which was counseling/coordinating care for CVA

## 2016-11-30 NOTE — Progress Notes (Signed)
OT Cancellation Note  Patient Details Name: Valerie Mcclure MRN: [id number] DOB: 13-Mar-1941   Cancelled Treatment:    Reason Eval/Treat Not Completed: Patient not medically ready.  Pt is currently intubated and on strict bedrest.  Will initiate OT eval once pt medically stable, and activity orders increased.  [name], OTR/L [phone]   Lucille Passy M 11/30/2016, 10:18 AM

## 2016-11-30 NOTE — Progress Notes (Signed)
STROKE TEAM PROGRESS NOTE   HISTORY OF PRESENT ILLNESS (per record) Valerie Mcclure is an 76 y.o. female Who was last known well at 10 PM 11/28/2016. This morning she woke up with aphasia and right hemiplegia.  CT showed hyperdense left MCA and hypodensity in the left temporal area.  CTA confirmed left MCA clot.  CTP showed CBF < 30% at 5 ml and Tmax >6 sec at 52 ml for a mismatch vol of 47 ml and ratio of 10.  She has a history of HTN and high cholesterol, but no DM, smoking.  Patient was not administered IV t-PA secondary to delay in arrival. She was taken to interventional neuroradiology where angiogram showed left MCA occlusion  She was admitted to the neuro ICU for further evaluation and treatment.   SUBJECTIVE (INTERVAL HISTORY)  Patient's daughter is at the bedside. Patient is intubated is able to open eyes and follow commands. Blood pressure has been adequately controlled. Follow-up imaging post procedure raised concern for basal ganglia ICH    OBJECTIVE Temp:  [97.2 F (36.2 C)-98.1 F (36.7 C)] 98.1 F (36.7 C) (05/14 0833) Pulse Rate:  [43-95] 82 (05/14 1055) Cardiac Rhythm: Normal sinus rhythm (05/14 0800) Resp:  [10-23] 17 (05/14 1055) BP: (103-174)/(37-84) 128/47 (05/14 1055) SpO2:  [95 %-100 %] 100 % (05/14 1055) FiO2 (%):  [40 %-50 %] 40 % (05/14 1055) Weight:  [74.8 kg (164 lb 14.5 oz)-79.5 kg (175 lb 4.3 oz)] 79.5 kg (175 lb 4.3 oz) (05/13 2000)  CBC:  Recent Labs Lab 11/29/16 0834 11/29/16 0842 11/30/16 0329  WBC 6.3  --  20.7*  NEUTROABS 5.3  --  18.8*  HGB 11.7* 11.2* 12.6  HCT 35.2* 33.0* 37.6  MCV 91.4  --  90.8  PLT 204  --  106    Basic Metabolic Panel:  Recent Labs Lab 11/29/16 0834 11/29/16 0842 11/30/16 0329  NA 137 139 141  K 3.7 3.7 3.5  CL 102 100* 106  CO2 27  --  22  GLUCOSE 141* 139* 150*  BUN 24* 27* 19  CREATININE 1.49* 1.40* 1.26*  CALCIUM 8.7*  --  8.2*    Lipid Panel:    Component Value Date/Time   CHOL 162 11/30/2016  0329   TRIG 175 (H) 11/30/2016 0329   HDL 42 11/30/2016 0329   CHOLHDL 3.9 11/30/2016 0329   VLDL 35 11/30/2016 0329   LDLCALC 85 11/30/2016 0329   HgbA1c:  Lab Results  Component Value Date   HGBA1C 5.9 07/31/2016   Urine Drug Screen: No results found for: LABOPIA, COCAINSCRNUR, LABBENZ, AMPHETMU, THCU, LABBARB  Alcohol Level No results found for: ETH  IMAGING  Ct Head Wo Contrast 11/29/2016 0906 Hypodensity left temporal lobe consistent with acute infarct. Hyperdense left MCA compatible with thrombus or embolus. Aspects score 8  Ct Angio Head W Or Wo Contrast Ct Angio Neck W Or Wo Contrast Ct Cerebral Perfusion W Contrast 11/29/2016 Left M1 occlusion due to acute thrombus or embolus. Hypodensity left lower temporal lobe compatible with acute infarct. There is significant penumbra in the left temporoparietal lobe. Irregular noncalcified plaque left internal carotid artery could be a source of emboli or not the high non stenotic atherosclerotic disease. No significant stenosis in the carotid or vertebral arteries in the neck. Atherosclerotic calcification in the cavernous carotid bilaterally.   Ir Percutaneous Art Thrombectomy/infusion Intracranial Inc Diag Angio Ir Angio Intra Extracran Sel Com Carotid Innominate Uni L Mod Sed Ir Angio Vertebral Sel Subclavian Innominate  Uni R Mod Sed 11/29/2016 Status post endovascular complete revascularization of occluded left middle cerebral artery with 1 pass with the Solitaire FR 4 mm x 40 mm retrieval device, with achievement of a TICI 3 reperfusion. Groin puncture to initial reperfusion TICI2b 32 minutes. Groin puncture to TICI reperfusion 38 minutes     Ct Head Wo Contrast 11/29/2016 Large area of hemorrhage and extravasated contrast in the left basal ganglia, blood volume 39 mL. Hemorrhagic transformation of left temporal infarct. Mild amount of subdural hematoma along the tentorium bilaterally. 6 mm midline shift to the right.   MRI  head 11/30/2016 1. Left MCA infarct with the most confluent diffusion restriction in the left temporal lobe. Superimposed 5.5 cm intra-axial hemorrhage centered at the left basal ganglia with intra-axial blood volume estimated 26 mL. Associated left hemisphere edema.  2. Rightward midline shift of 7 mm.  Basilar cisterns remain patent. 3. Effaced left lateral ventricle and trace intraventricular hemorrhage without ventriculomegaly. 4. Occasional small foci of restricted diffusion also at the left PCA territory.  Dg Chest Port 1 View 11/29/2016 1. No active cardiopulmonary disease. No evidence of pneumonia or pulmonary edema. 2. Stable cardiomegaly. 3. Aortic atherosclerosis.  11/30/2016 Endotracheal tube approximately 2.3 cm from the carina. Enteric tube in place. Borderline cardiomegaly.  No new abnormality.   Dg Abd Portable 1v 11/30/2016 Enteric tube tip projects over the mid stomach.    PHYSICAL EXAM Elderly Caucasian lady who is intubated and sedated. . Afebrile. Head is nontraumatic. Neck is supple without bruit.    Cardiac exam no murmur or gallop. Lungs are clear to auscultation. Distal pulses are well felt. Neurological Exam :  Patient is drowsy but able to open eyes. Pupils irregular 3 mm reactive. Left gaze preference but will try to look to the right to midline. Rings to threat on the left but not the right. Right lower facial weakness. Tongue midline. Patient follows simple midline commands on the left side. She has dense right hemiplegia. She does withdraw to move the right lower extremity to command. Trace withdrawal in the right upper extremity to painful stimuli. Reflexes are normal on the left and depressed on the right. Left plantar is downgoing right is upgoing.   ASSESSMENT/PLAN Valerie Mcclure is a 76 y.o. female with history of chronic low back pain, HTN, HLD, hypothyroidism presenting with aphasia and right hemiplegia on awakening. She did not receive IV t-PA due  to delay in arrival. Taken to interventional neuroradiology was she received to [facility] revascularization of the occluded left MCA with mechanical thrombectomy. Patient with post IR hemorrhage.  Stroke:   Left MCA infarct status post mechanical thrombectomy with TICI3 revascularization of occluded L MCA. Post IR large L basal ganglia hemorrhage w/ hemorrhagic transforation in L temporal infarct, B SDH, IVH and cerebral edema w/ R shift (subfalcine herniation). infarct embolic secondary to unknown source  Resultant   right hemiplegia  CT head , left temporal lobe hypodensity. Hyperdense left MCA. Aspects 8  CTA H and N L M1 occlusion. Left temporal lobe hypodensity. L ICA irregular plaque. Bilateral carotid atherosclerosis  Cerebral angiogram occluded L MCA with eventual TICI3 revascularization with 1 pass with solitaire.   CT perfusion Significant penumbra  Post IR CT large hemorrhage with extravasated contrast, left basal ganglia, volume 39 mils. Hemorrhagic transformation. Left temporal infarct. Subdural hematoma, bilateral tentorium. 6 mm rightward midline shift. Spasm post IR treated w/ NTG.  MRI  L MCA infarct. L BG hemorrhage. L brain edema.  Right shift 44mm. Trace IVH. Small foci L PCA restricted diffusion  2D Echo  pending   LDL 85  HgbA1c pending  SCDs for VTE prophylaxis  Diet NPO time specified  No antithrombotic prior to admission, now on No antithrombotic  Due to [place]  Ongoing aggressive stroke risk factor management  Therapy recommendations:  pending   Disposition:  pending   Acute respiratory failure   Intubated for neuro intervention   Plan to extubate after MRI if stable   Hypertension  Goal per intervention   Currently stable on Celebrex   Home meds: Lopressor, Procardia, diovan  Hyperlipidemia  Home meds:  No statin  LDL 85, goal < 70  Add statin once able to swallow    Other Stroke Risk Factors  Advanced age  Obesity, Body mass index is  31.05 kg/m., recommend weight loss, diet and exercise as appropriate   Other Active Problems  Chronic low back pain, on morphine  Anemia with hx multiple transfusions  On estrace  Daily  On prozac. Pamelor  On lasix  Hypothyroid, on Synthroid  Hospital day # 1 I have personally examined this patient, reviewed notes, independently viewed imaging studies, participated in medical decision making and plan of care.ROS completed by me personally and pertinent positives fully documented  I have made any additions or clarifications directly to the above note. She presented with right hemiplegia secondary to left middle cerebral artery infarct with left nasal cerebral artery occlusion and underwent mechanical embolectomy with complete revascularization. Postprocedure CT shows basal ganglia hemorrhage. She remains at risk for neurological worsening. Recommend strict control of blood pressure and close neurological monitoring. Prognosis is guarded at this time. Plan to check MRI scan later today. Long discussion of the bedside with the patient's daughter and answered questions regarding her care. Consider extubation if able to protect her airway after MRI scan Discussed with critical care PA. This patient is critically ill and at significant risk of neurological worsening, death and care requires constant monitoring of vital signs, hemodynamics,respiratory and cardiac monitoring, extensive review of multiple databases, frequent neurological assessment, discussion with family, other specialists and medical decision making of high complexity.I have made any additions or clarifications directly to the above note.This critical care time does not reflect procedure time, or teaching time or supervisory time of PA/NP/Med Resident etc but could involve care discussion time.  I spent 40 minutes of neurocritical care time  in the care of  this patient.     Antony Contras, MD Medical Director [facility] Stroke  Center Pager: [phone] 11/30/2016 4:00 PM   To contact Stroke Continuity provider, please refer to [email or web address] After hours, contact General Neurology

## 2016-11-30 NOTE — Consult Note (Signed)
PULMONARY / CRITICAL CARE MEDICINE   Name: Valerie Mcclure MRN: [id number] DOB: Apr 15, 1941    ADMISSION DATE:  11/29/2016 CONSULTATION DATE:  11/30/2016  REFERRING MD:  [name], neurology  CHIEF COMPLAINT:  Respiratory failure post CVA  HISTORY OF PRESENT ILLNESS:   76 year old female who woke up aphasic and with right sided hemiplegia.  Found to have a hyperdense left MCA and hypodense left temporal area.  Patient was taken to IR for clot retrieval.  Patient was sedated for procedure.  Patient is now sedated and intubated, unable to obtain much history.  PCCM consulted for vent management.  PAST MEDICAL HISTORY :  She  has a past medical history of Blood transfusion without reported diagnosis; Chronic low back pain; Hyperlipidemia; Osteopenia; Unspecified essential hypertension; and Unspecified hypothyroidism.  PAST SURGICAL HISTORY: She  has a past surgical history that includes Abdominal hysterectomy (1970); Thyroidectomy; Lumbar laminectomy (11/2008); Cholecystectomy (07/2009); Colonoscopy (2003); Flexible sigmoidoscopy (2010); Hand surgery; IR PERCUTANEOUS ART THROMBECTOMY/INFUSION INTRACRANIAL INC DIAG ANGIO (11/29/2016); IR ANGIO VERTEBRAL SEL SUBCLAVIAN INNOMINATE UNI R MOD SED (11/29/2016); and IR ANGIO INTRA EXTRACRAN SEL COM CAROTID INNOMINATE UNI L MOD SED (11/29/2016).  Allergies  Allergen Reactions  . Shrimp [Shellfish Allergy]     Break outs and swelling   . Tandem Plus [Fefum-Fepo-Fa-B Cmp-C-Zn-Mn-Cu] Nausea And Vomiting  . Ivp Dye [Iodinated Diagnostic Agents] Rash    itching  . Penicillins Rash    itching    No current facility-administered medications on file prior to encounter.    Current Outpatient Prescriptions on File Prior to Encounter  Medication Sig  . estradiol (ESTRACE) 0.5 MG tablet TAKE 1 TABLET(0.5 MG) BY MOUTH DAILY  . FLUoxetine (PROZAC) 20 MG capsule Take 1 capsule (20 mg total) by mouth daily. (Patient taking differently: Take 40 mg by mouth daily. )   . furosemide (LASIX) 20 MG tablet TAKE 1 TABLET BY MOUTH DAILY AS NEEDED  . metoprolol tartrate (LOPRESSOR) 25 MG tablet TAKE 1 TABLET(25 MG) BY MOUTH TWICE DAILY  . morphine (MS CONTIN) 15 MG 12 hr tablet Take 15 mg by mouth every 12 (twelve) hours.   . nortriptyline (PAMELOR) 10 MG capsule TAKE 2 CAPSULES BY MOUTH AT BEDTIME  . simvastatin (ZOCOR) 10 MG tablet TAKE 1 TABLET(10 MG) BY MOUTH DAILY  . valsartan (DIOVAN) 160 MG tablet Take 1 tablet (160 mg total) by mouth daily.  . Calcium Carbonate-Vit D-Min (CALCIUM 1200 PO) Take by mouth daily.    . Cholecalciferol (VITAMIN D3) 1000 UNITS CAPS Take by mouth daily.    Marland Kitchen levothyroxine (SYNTHROID, LEVOTHROID) 88 MCG tablet Take 1 tablet (88 mcg total) by mouth daily.    FAMILY HISTORY:  Her indicated that the status of her father is unknown. She indicated that the status of her brother is unknown. She indicated that the status of her neg hx is unknown.    SOCIAL HISTORY: She  reports that she has never smoked. She has never used smokeless tobacco. She reports that she does not drink alcohol or use drugs.  REVIEW OF SYSTEMS:   Unattainable, patient is sedated and intubated.  SUBJECTIVE:  No events overnight  VITAL SIGNS: BP (!) 128/44   Pulse 78   Temp 98.1 F (36.7 C) (Axillary)   Resp 16   Ht 5\' 3"  (1.6 m)   Wt 79.5 kg (175 lb 4.3 oz)   SpO2 100%   BMI 31.05 kg/m   HEMODYNAMICS:    VENTILATOR SETTINGS: Vent Mode: PSV;CPAP FiO2 (%):  [  40 %-50 %] 40 % Set Rate:  [12 bmp] 12 bmp Vt Set:  [480 mL] 480 mL PEEP:  [5 cmH20] 5 cmH20 Pressure Support:  [10 cmH20] 10 cmH20 Plateau Pressure:  [16 cmH20] 16 cmH20  INTAKE / OUTPUT: I/O last 3 completed shifts: In: 2678.6 [I.V.:2678.6] Out: 6237 [Urine:1325; Blood:50]  PHYSICAL EXAMINATION: General:  Acutely ill appearing female, NAD Neuro:  Arousable on propofol and nodding to questions. HEENT:  [place]/AT, PERRL, EOM-I and MMM Cardiovascular:  RRR, Nl S1/S2, -M/R/G. Lungs:   CTA bilaterally. Abdomen:  Soft, NT, ND and +BS Musculoskeletal:  -edema and -tenderness Skin:  Intact  LABS:  BMET  Recent Labs Lab 11/29/16 0834 11/29/16 0842 11/30/16 0329  NA 137 139 141  K 3.7 3.7 3.5  CL 102 100* 106  CO2 27  --  22  BUN 24* 27* 19  CREATININE 1.49* 1.40* 1.26*  GLUCOSE 141* 139* 150*    Electrolytes  Recent Labs Lab 11/29/16 0834 11/30/16 0329  CALCIUM 8.7* 8.2*    CBC  Recent Labs Lab 11/29/16 0834 11/29/16 0842 11/30/16 0329  WBC 6.3  --  20.7*  HGB 11.7* 11.2* 12.6  HCT 35.2* 33.0* 37.6  PLT 204  --  281    Coag's  Recent Labs Lab 11/29/16 0834  APTT 29  INR 1.01    Sepsis Markers No results for input(s): LATICACIDVEN, PROCALCITON, O2SATVEN in the last 168 hours.  ABG  Recent Labs Lab 11/29/16 1700  PHART 7.355  PCO2ART 41.3  PO2ART 131*    Liver Enzymes  Recent Labs Lab 11/29/16 0834  AST 29  ALT 17  ALKPHOS 56  BILITOT 0.3  ALBUMIN 3.4*    Cardiac Enzymes No results for input(s): TROPONINI, PROBNP in the last 168 hours.  Glucose  Recent Labs Lab 11/29/16 0845  GLUCAP 121*    Imaging Ct Angio Head W Or Wo Contrast  Result Date: 11/29/2016 CLINICAL DATA:  Right facial droop and slurred speech. Last seen normal 10 p.m. last night. Stroke. EXAM: CT ANGIOGRAPHY HEAD AND NECK CT PERFUSION BRAIN TECHNIQUE: Multidetector CT imaging of the head and neck was performed using the standard protocol during bolus administration of intravenous contrast. Multiplanar CT image reconstructions and MIPs were obtained to evaluate the vascular anatomy. Carotid stenosis measurements (when applicable) are obtained utilizing NASCET criteria, using the distal internal carotid diameter as the denominator. Multiphase CT imaging of the brain was performed following IV bolus contrast injection. Subsequent parametric perfusion maps were calculated using RAPID software. CONTRAST:  80 mL Isovue 370 IV COMPARISON:  CT head  11/29/2016 FINDINGS: CTA NECK FINDINGS Aortic arch: Mild atherosclerotic disease in the aortic arch. Proximal great vessels widely patent. Right carotid system: Mild atherosclerotic disease at the right carotid bifurcation. No significant carotid stenosis. Left carotid system: Noncalcified plaque along the medial wall of the left internal carotid artery with mild irregularity but no definite ulceration. Less than 25% diameter stenosis of the left internal carotid artery. Left external carotid artery patent. Vertebral arteries: Both vertebral arteries patent to the basilar without significant stenosis. Skeleton: Moderate cervical spine degenerative change. No acute skeletal abnormality. Other neck: 10 mm rim calcified nodule in the right lobe of the thyroid, with a benign appearance. Smaller partially calcified nodule in the right upper lobe of the thyroid also likely benign. Upper chest: Mild mosaic pattern of lung density in the right lung apex. Question mild edema. Review of the MIP images confirms the above findings CTA HEAD FINDINGS Anterior circulation:  Mild atherosclerotic disease in the cavernous carotid bilaterally which is calcified but non stenotic. Left M1 segment occluded. This is hyperdense on CT and appears acute. There is perfusion of the left MCA branches due to collaterals. Posterior circulation: Both vertebral arteries patent to the basilar. PICA patent bilaterally. Basilar patent. Superior cerebellar and posterior cerebral arteries patent bilaterally without stenosis. Venous sinuses: Patent Anatomic variants: None Delayed phase: Not performed Review of the MIP images confirms the above findings CT Brain Perfusion Findings: CBF (<30%) Volume: 5 mLmL. However, review of the head CT of earlier today, there is hypodensity left temporal lobe compatible with acute infarct which is larger than indicated on CT perfusion. Perfusion (Tmax>6.0s) volume: 52 mLmL Perfusion (T-max greater than 4 seconds)  volume:  99 mL Mismatch Volume: 47 mL.ML. Review of the perfusion images reveals significant penumbra greater than the calculated amount involving the left temporal parietal lobe. 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 Hypodensity left lower temporal lobe compatible with acute infarct. There is significant penumbra in the left temporoparietal lobe. Irregular noncalcified plaque left internal carotid artery could be a source of emboli or not the high non stenotic atherosclerotic disease. No significant stenosis in the carotid or vertebral arteries in the neck. Atherosclerotic calcification in the cavernous carotid bilaterally. These results were called by telephone at the time of interpretation on 11/29/2016 at 10:50 Am to Dr. Rogue Jury , who verbally acknowledged these results. Electronically Signed   By: Franchot Gallo M.D.   On: 11/29/2016 11:11   Ct Head Wo Contrast  Result Date: 11/29/2016 CLINICAL DATA:  Stroke post thrombectomy EXAM: CT HEAD WITHOUT CONTRAST TECHNIQUE: Contiguous axial images were obtained from the base of the skull through the vertex without intravenous contrast. COMPARISON:  CT head 11/29/2016 FINDINGS: Brain: High-density within the left putamen and left caudate compatible with acute hemorrhage. There is also high density in the superior basal ganglia compatible with extravasation of contrast. The high density area is most likely a mixture of blood and contrast. This area measures approximately 6.1 x 3.5 x 3.7 cm, acute blood volume 39 mL. Hemorrhagic transformation of left temporal lobe infarct. Compression of the left lateral ventricle due to mass-effect. 6 mm midline shift to the right. Interval development of subdural hemorrhage along the tentorium bilaterally. Vascular: Normal arterial and venous enhancement post angiography. Skull: Negative for fracture. Sinuses/Orbits: Mucosal edema in the paranasal  sinuses. Normal orbit. Other: None IMPRESSION: Large area of hemorrhage and extravasated contrast in the left basal ganglia, blood volume 39 mL. Hemorrhagic transformation of left temporal infarct. Mild amount of subdural hematoma along the tentorium bilaterally. 6 mm midline shift to the right. These results were called by telephone at the time of interpretation on 11/29/2016 at 2:20 pm to Dr. Luanne Bras , who verbally acknowledged these results. Electronically Signed   By: Franchot Gallo M.D.   On: 11/29/2016 14:21   Ct Angio Neck W Or Wo Contrast  Result Date: 11/29/2016 CLINICAL DATA:  Right facial droop and slurred speech. Last seen normal 10 p.m. last night. Stroke. EXAM: CT ANGIOGRAPHY HEAD AND NECK CT PERFUSION BRAIN TECHNIQUE: Multidetector CT imaging of the head and neck was performed using the standard protocol during bolus administration of intravenous contrast. Multiplanar CT image reconstructions and MIPs were obtained to evaluate the vascular anatomy. Carotid stenosis measurements (when applicable) are obtained utilizing NASCET criteria, using the distal internal carotid diameter as the denominator. Multiphase CT imaging of the  brain was performed following IV bolus contrast injection. Subsequent parametric perfusion maps were calculated using RAPID software. CONTRAST:  80 mL Isovue 370 IV COMPARISON:  CT head 11/29/2016 FINDINGS: CTA NECK FINDINGS Aortic arch: Mild atherosclerotic disease in the aortic arch. Proximal great vessels widely patent. Right carotid system: Mild atherosclerotic disease at the right carotid bifurcation. No significant carotid stenosis. Left carotid system: Noncalcified plaque along the medial wall of the left internal carotid artery with mild irregularity but no definite ulceration. Less than 25% diameter stenosis of the left internal carotid artery. Left external carotid artery patent. Vertebral arteries: Both vertebral arteries patent to the basilar without  significant stenosis. Skeleton: Moderate cervical spine degenerative change. No acute skeletal abnormality. Other neck: 10 mm rim calcified nodule in the right lobe of the thyroid, with a benign appearance. Smaller partially calcified nodule in the right upper lobe of the thyroid also likely benign. Upper chest: Mild mosaic pattern of lung density in the right lung apex. Question mild edema. Review of the MIP images confirms the above findings CTA HEAD FINDINGS Anterior circulation: Mild atherosclerotic disease in the cavernous carotid bilaterally which is calcified but non stenotic. Left M1 segment occluded. This is hyperdense on CT and appears acute. There is perfusion of the left MCA branches due to collaterals. Posterior circulation: Both vertebral arteries patent to the basilar. PICA patent bilaterally. Basilar patent. Superior cerebellar and posterior cerebral arteries patent bilaterally without stenosis. Venous sinuses: Patent Anatomic variants: None Delayed phase: Not performed Review of the MIP images confirms the above findings CT Brain Perfusion Findings: CBF (<30%) Volume: 5 mLmL. However, review of the head CT of earlier today, there is hypodensity left temporal lobe compatible with acute infarct which is larger than indicated on CT perfusion. Perfusion (Tmax>6.0s) volume: 52 mLmL Perfusion (T-max greater than 4 seconds) volume:  99 mL Mismatch Volume: 47 mL.ML. Review of the perfusion images reveals significant penumbra greater than the calculated amount involving the left temporal parietal lobe. 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 Hypodensity left lower temporal lobe compatible with acute infarct. There is significant penumbra in the left temporoparietal lobe. Irregular noncalcified plaque left internal carotid artery could be a source of emboli or not the high non stenotic atherosclerotic disease. No significant stenosis in the  carotid or vertebral arteries in the neck. Atherosclerotic calcification in the cavernous carotid bilaterally. These results were called by telephone at the time of interpretation on 11/29/2016 at 10:50 Am to Dr. Rogue Jury , who verbally acknowledged these results. Electronically Signed   By: Franchot Gallo M.D.   On: 11/29/2016 11:11   Ct Cerebral Perfusion W Contrast  Result Date: 11/29/2016 CLINICAL DATA:  Right facial droop and slurred speech. Last seen normal 10 p.m. last night. Stroke. EXAM: CT ANGIOGRAPHY HEAD AND NECK CT PERFUSION BRAIN TECHNIQUE: Multidetector CT imaging of the head and neck was performed using the standard protocol during bolus administration of intravenous contrast. Multiplanar CT image reconstructions and MIPs were obtained to evaluate the vascular anatomy. Carotid stenosis measurements (when applicable) are obtained utilizing NASCET criteria, using the distal internal carotid diameter as the denominator. Multiphase CT imaging of the brain was performed following IV bolus contrast injection. Subsequent parametric perfusion maps were calculated using RAPID software. CONTRAST:  80 mL Isovue 370 IV COMPARISON:  CT head 11/29/2016 FINDINGS: CTA NECK FINDINGS Aortic arch: Mild atherosclerotic disease in the aortic arch. Proximal great vessels widely patent. Right carotid system: Mild atherosclerotic  disease at the right carotid bifurcation. No significant carotid stenosis. Left carotid system: Noncalcified plaque along the medial wall of the left internal carotid artery with mild irregularity but no definite ulceration. Less than 25% diameter stenosis of the left internal carotid artery. Left external carotid artery patent. Vertebral arteries: Both vertebral arteries patent to the basilar without significant stenosis. Skeleton: Moderate cervical spine degenerative change. No acute skeletal abnormality. Other neck: 10 mm rim calcified nodule in the right lobe of the thyroid, with a  benign appearance. Smaller partially calcified nodule in the right upper lobe of the thyroid also likely benign. Upper chest: Mild mosaic pattern of lung density in the right lung apex. Question mild edema. Review of the MIP images confirms the above findings CTA HEAD FINDINGS Anterior circulation: Mild atherosclerotic disease in the cavernous carotid bilaterally which is calcified but non stenotic. Left M1 segment occluded. This is hyperdense on CT and appears acute. There is perfusion of the left MCA branches due to collaterals. Posterior circulation: Both vertebral arteries patent to the basilar. PICA patent bilaterally. Basilar patent. Superior cerebellar and posterior cerebral arteries patent bilaterally without stenosis. Venous sinuses: Patent Anatomic variants: None Delayed phase: Not performed Review of the MIP images confirms the above findings CT Brain Perfusion Findings: CBF (<30%) Volume: 5 mLmL. However, review of the head CT of earlier today, there is hypodensity left temporal lobe compatible with acute infarct which is larger than indicated on CT perfusion. Perfusion (Tmax>6.0s) volume: 52 mLmL Perfusion (T-max greater than 4 seconds) volume:  99 mL Mismatch Volume: 47 mL.ML. Review of the perfusion images reveals significant penumbra greater than the calculated amount involving the left temporal parietal lobe. 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 Hypodensity left lower temporal lobe compatible with acute infarct. There is significant penumbra in the left temporoparietal lobe. Irregular noncalcified plaque left internal carotid artery could be a source of emboli or not the high non stenotic atherosclerotic disease. No significant stenosis in the carotid or vertebral arteries in the neck. Atherosclerotic calcification in the cavernous carotid bilaterally. These results were called by telephone at the time of interpretation on  11/29/2016 at 10:50 Am to Dr. Rogue Jury , who verbally acknowledged these results. Electronically Signed   By: Franchot Gallo M.D.   On: 11/29/2016 11:11   Dg Chest Port 1 View  Result Date: 11/30/2016 CLINICAL DATA:  Confirm orogastric tube placement. EXAM: PORTABLE CHEST 1 VIEW COMPARISON:  Chest radiograph earlier this day at 0907 hour FINDINGS: Endotracheal tube is 2.3 cm from the carina. Enteric tube in place, tip and side-port below the diaphragm. Low lung volumes. Borderline cardiomegaly. No pulmonary edema. No large pleural effusion. No pneumothorax. IMPRESSION: Endotracheal tube approximately 2.3 cm from the carina. Enteric tube in place. Borderline cardiomegaly.  No new abnormality. Electronically Signed   By: Jeb Levering M.D.   On: 11/30/2016 00:28   Dg Abd Portable 1v  Result Date: 11/30/2016 CLINICAL DATA:  76 y/o  F; enteric tube placement. EXAM: PORTABLE ABDOMEN - 1 VIEW COMPARISON:  None. FINDINGS: Nonobstructive bowel gas pattern. Enteric tube tip projects over the stomach. Advanced lumbar levocurvature and lower lumbar fusion changes. IMPRESSION: Enteric tube tip projects over the mid stomach. Electronically Signed   By: Kristine Garbe M.D.   On: 11/30/2016 00:26   Ir Percutaneous Art Thrombectomy/infusion Intracranial Inc Diag Angio  Result Date: 11/30/2016 INDICATION: Patient presenting with aphasia and right-sided weakness with left gaze preference. CT angiogram  of the head and neck revealing an occluded left middle cerebral artery M1 segment with a sizable penumbra on CT perfusion study. CBF < 30 % 5 ml,Tmax > 6 secs of 52 ml with a mismatch vol of 81ml. EXAM: 1. EMERGENT LARGE VESSEL OCCLUSION THROMBOLYSIS (anterior CIRCULATION) 2. COMPARISON:  CT angiogram of the head and neck, and CT perfusion study of 11/29/2016. MEDICATIONS: Vancomycin 1.5 mg IV was administered within 1 hour of the procedure. ANESTHESIA/SEDATION: General anesthesia. CONTRAST:  Isovue 300  approximately 75 cc. FLUOROSCOPY TIME:  Fluoroscopy Time: 28 minutes 36 seconds (1539 mGy). COMPLICATIONS: None immediate. TECHNIQUE: Following a full explanation of the procedure along with the potential associated complications, an informed witnessed consent was obtained. The risks of intracranial hemorrhage of 10%, worsening neurological deficit, ventilator dependency, death and inability to revascularize were all reviewed in detail with the patient's . The patient was then put under general anesthesia by the Department of Anesthesiology at [facility]. The right groin was prepped and draped in the usual sterile fashion. Thereafter using modified Seldinger technique, transfemoral access into the right common femoral artery was obtained without difficulty. Over a 0.035 inch guidewire a 5 French Pinnacle sheath was inserted. Through this, and also over a 0.035 inch guidewire a 5 [place] JB 1 catheter was advanced to the aortic arch region and selectively positioned in the right common carotid artery and the left common carotid artery, and the innominate artery. FINDINGS: The right subclavian arteriogram demonstrates the right vertebral artery to opacify normally to the cranial skull base. Flow is noted into the right vertebrobasilar junction and the right posterior-inferior cerebellar artery. Flash opacification of the proximal basilar artery is also noted. The right common carotid arteriogram demonstrates the right external carotid artery and its major branches to be widely patent. The right internal carotid artery at the bulb to the cranial skull base opacifies widely. The petrous, the cavernous and the supraclinoid segments are widely patent. The right middle cerebral artery and the right anterior cerebral artery opacify into the capillary and venous phases. Flash opacification via the anterior communicating artery of the left anterior cerebral artery A2 segment is also noted. Also noted is contrast blush  in the region of the middle terminate of the right maxillary sinus most consistent with inflammatory reaction. The left common carotid arteriogram demonstrates the left external carotid artery and its major branches to be widely patent. The left internal carotid artery at the bulb to the cranial skull base opacifies widely. There are mild FMD-like changes noted in the mid left internal carotid artery mid cervical segment. The petrous, the cavernous and the supraclinoid segments demonstrate wide patency. A left posterior communicating artery is seen opacifying the left posterior cerebral artery distribution. The left anterior cerebral artery opacifies into the capillary and venous phases with flash filling via the anterior communicating artery of the right anterior cerebral artery A2 and A1 segments. The left middle cerebral artery demonstrates complete angiographic occlusion in its mid M1 segment with delayed opacification of an anterior temporal branch with prominent filling defects proximally. The M1 segment, otherwise, remains completely occluded. The delayed arterial phase in the lateral projection demonstrates partial retrograde opacification of the distal perisylvian branches with large area of hypoperfusion involving the lentiform nucleus and the caudate head. PROCEDURE: ENDOVASCULAR COMPLETE REVASCULARIZATION OF OCCLUDED LEFT MIDDLE CEREBRAL ARTERY M1 SEGMENT The diagnostic JB 1 catheter in the left common carotid artery was then exchanged over a 0.035 inch 300 cm Rosen exchange guidewire for an  8 French 55 cm Brite tip neurovascular sheath using biplane roadmap technique and constant fluoroscopic guidance. Good aspiration was obtained from the side port of the neurovascular sheath. This was then connected to continuous heparinized saline infusion. Over the [employer] guidewire, an 8 [place] 85 cm FlowGate balloon guide catheter which had been prepped with 50% contrast and 50% heparinized saline infusion  was advanced and positioned in the distal left common carotid artery. The guidewire was removed. Good aspiration was obtained from the hub of the 8 [place] FlowGate guide catheter. A gentle contrast injection demonstrated no evidence of spasms, dissections or of intraluminal filling defects. At this time, using biplane roadmap technique and constant fluoroscopic guidance, over a 0.035 inch Roadrunner guidewire, the 8 [place] FlowGate guide catheter was then advanced to the distal cervical left ICA without difficulty. Good aspiration was obtained after removal of the wire. A gentle control arteriogram demonstrated mild spasm with no impediment of distal flow. Patient was treated with 1 aliquots of 25 mics of nitroglycerin with relief of the vasospasm. At this time, in a coaxial manner and with constant heparinized saline infusion using biplane roadmap technique and constant fluoroscopic guidance, a Trevo ProVue 021 microcatheter was advanced over a 0.014 inch Softip Synchro micro guidewire to the distal end of the FlowGate guide catheter. With the micro guidewire leading with a J-tip configuration the combination was navigated to the supraclinoid right ICA. A torque device was then used to manipulate the guidewire into the left middle cerebral artery followed by the microcatheter. The micro guidewire was then advanced without difficulty into the M2 M3 region of the inferior division of left middle cerebral artery followed by the microcatheter. The guidewire was removed. Good aspiration was obtained from the hub of the microcatheter. A gentle contrast injection demonstrated antegrade flow of contrast. At this time, a 4 mm x 40 mm Solitaire FR retrieval device was advanced in a coaxial manner and with constant heparinized saline infusion using biplane roadmap technique and constant fluoroscopic guidance to the distal portion of the microcatheter. At this time the entire system was straightened by loosening the O ring on  the delivery microcatheter. After having ascertained the distal and the proximal positioning of the retrieval device, with slight forward gentle traction with the right hand on the delivery micro guidewire, with the left hand the microcatheter was retrieved unsheathing the entire Solitaire FR retrieval device with the tip of the microcatheter just proximal to the proximal landing zone of the retrieval device. A control arteriogram performed through the 8 [place] FlowGate guide catheter demonstrated significantly improved caliber and flow through the middle cerebral artery distribution with a narrowing of the retrieval device in the distal M1 segment. At this time, the balloon of the [facility] guide catheter was inflated in the distal left internal carotid artery for proximal flow arrest. The proximal portion of the retrieval device was captured into the microcatheter. There on after whilst aspirating with a 60 mL syringe at the hub of the [facility] guide catheter, the combination of the retrieval device and the microcatheter were gently retrieved and removed. The aspirate demonstrated prominent clot measuring approximately 2 mm x 4 mm. Aspiration was continued as the balloon was deflated in the left internal carotid artery. The FlowGate guide catheter was gently retrieved more proximally after deflation of the balloon. A control arteriogram performed through the 8 [place] FlowGate guide catheter demonstrated complete angiographic revascularization of the left occluded left middle cerebral artery with patency of the left posterior  cerebral and left anterior cerebral arteries. Focal segmental spasm was noted of the left middle cerebral artery involving the inferior division in the distal M1 region. This responded to 4 aliquots of 25 mics of nitroglycerin intra-arterially. A final control arteriogram performed demonstrated significant improved caliber of the previously noted vasospasm of the left middle cerebral artery,  and also the left internal carotid artery mid cervical segment. Also noted of the final to control arteriograms a focal area of hyper density in the left centrum semiovale region. No evidence of angiographic extravasation or mass effect was seen. Patient's hemodynamic status, blood pressure and heart rate remained stable throughout the procedure. The 8 [place] FlowGate guide catheter and the 8 [place] neurovascular sheath were then retrieved into the abdominal aorta and exchanged over a J-tip guidewire for an 8 French Pinnacle sheath which in turn was successfully removed with the application of a closure device. The right groin showed no evidence of a hematoma or hemorrhage. The distal pulses in both feet remained Dopplerable unchanged from prior to the procedure. IMPRESSION: Status post endovascular complete revascularization of occluded left middle cerebral artery with 1 pass with the Solitaire FR 4 mm x 40 mm retrieval device, with achievement of a TICI 3 reperfusion. Groin puncture to initial reperfusion TICI2b 32 minutes. Groin puncture to TICI reperfusion 38 minutes PLAN: The patient was transferred to the CT scanner for postprocedural CT scan of the brain. Electronically Signed   By: Luanne Bras M.D.   On: 11/29/2016 14:42   Ir Angio Intra Extracran Sel Com Carotid Innominate Uni L Mod Sed  Result Date: 11/30/2016 INDICATION: Patient presenting with aphasia and right-sided weakness with left gaze preference. 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 CBF < 30 % 5 ml,Tmax > 6 secs of 52 ml with a mismatch vol of 85ml. EXAM: 1. EMERGENT LARGE VESSEL OCCLUSION THROMBOLYSIS (anterior CIRCULATION) 2. COMPARISON:  CT angiogram of the head and neck, and CT perfusion study of 11/29/2016. MEDICATIONS: Vancomycin 1.5 mg IV was administered within 1 hour of the procedure. ANESTHESIA/SEDATION: General anesthesia.  CONTRAST:  Isovue 300 approximately 75 cc. FLUOROSCOPY TIME:  Fluoroscopy Time: 28 minutes 36 seconds (1539 mGy). COMPLICATIONS: None immediate. TECHNIQUE: Following a full explanation of the procedure along with the potential associated complications, an informed witnessed consent was obtained. The risks of intracranial hemorrhage of 10%, worsening neurological deficit, ventilator dependency, death and inability to revascularize were all reviewed in detail with the patient's . The patient was then put under general anesthesia by the Department of Anesthesiology at [facility]. The right groin was prepped and draped in the usual sterile fashion. Thereafter using modified Seldinger technique, transfemoral access into the right common femoral artery was obtained without difficulty. Over a 0.035 inch guidewire a 5 French Pinnacle sheath was inserted. Through this, and also over a 0.035 inch guidewire a 5 [place] JB 1 catheter was advanced to the aortic arch region and selectively positioned in the right common carotid artery and the left common carotid artery, and the innominate artery. FINDINGS: The right subclavian arteriogram demonstrates the right vertebral artery to opacify normally to the cranial skull base. Flow is noted into the right vertebrobasilar junction and the right posterior-inferior cerebellar artery. Flash opacification of the proximal basilar artery is also noted. The right common carotid arteriogram demonstrates the right external carotid artery and its major branches to be widely patent. The  right internal carotid artery at the bulb to the cranial skull base opacifies widely. The petrous, the cavernous and the supraclinoid segments are widely patent. The right middle cerebral artery and the right anterior cerebral artery opacify into the capillary and venous phases. Flash opacification via the anterior communicating artery of the left anterior cerebral artery A2 segment is also noted. Also  noted is contrast blush in the region of the middle terminate of the right maxillary sinus most consistent with inflammatory reaction. The left common carotid arteriogram demonstrates the left external carotid artery and its major branches to be widely patent. The left internal carotid artery at the bulb to the cranial skull base opacifies widely. There are mild FMD-like changes noted in the mid left internal carotid artery mid cervical segment. The petrous, the cavernous and the supraclinoid segments demonstrate wide patency. A left posterior communicating artery is seen opacifying the left posterior cerebral artery distribution. The left anterior cerebral artery opacifies into the capillary and venous phases with flash filling via the anterior communicating artery of the right anterior cerebral artery A2 and A1 segments. The left middle cerebral artery demonstrates complete angiographic occlusion in its mid M1 segment with delayed opacification of an anterior temporal branch with prominent filling defects proximally. The M1 segment, otherwise, remains completely occluded. The delayed arterial phase in the lateral projection demonstrates partial retrograde opacification of the distal perisylvian branches with large area of hypoperfusion involving the lentiform nucleus and the caudate head. PROCEDURE: ENDOVASCULAR COMPLETE REVASCULARIZATION OF OCCLUDED LEFT MIDDLE CEREBRAL ARTERY M1 SEGMENT The diagnostic JB 1 catheter in the left common carotid artery was then exchanged over a 0.035 inch 300 cm Rosen exchange guidewire for an 8 [place] 55 cm Brite tip neurovascular sheath using biplane roadmap technique and constant fluoroscopic guidance. Good aspiration was obtained from the side port of the neurovascular sheath. This was then connected to continuous heparinized saline infusion. Over the [employer] guidewire, an 8 [place] 85 cm FlowGate balloon guide catheter which had been prepped with 50% contrast and 50%  heparinized saline infusion was advanced and positioned in the distal left common carotid artery. The guidewire was removed. Good aspiration was obtained from the hub of the 8 [place] FlowGate guide catheter. A gentle contrast injection demonstrated no evidence of spasms, dissections or of intraluminal filling defects. At this time, using biplane roadmap technique and constant fluoroscopic guidance, over a 0.035 inch Roadrunner guidewire, the 8 [place] FlowGate guide catheter was then advanced to the distal cervical left ICA without difficulty. Good aspiration was obtained after removal of the wire. A gentle control arteriogram demonstrated mild spasm with no impediment of distal flow. Patient was treated with 1 aliquots of 25 mics of nitroglycerin with relief of the vasospasm. At this time, in a coaxial manner and with constant heparinized saline infusion using biplane roadmap technique and constant fluoroscopic guidance, a Trevo ProVue 021 microcatheter was advanced over a 0.014 inch Softip Synchro micro guidewire to the distal end of the FlowGate guide catheter. With the micro guidewire leading with a J-tip configuration the combination was navigated to the supraclinoid right ICA. A torque device was then used to manipulate the guidewire into the left middle cerebral artery followed by the microcatheter. The micro guidewire was then advanced without difficulty into the M2 M3 region of the inferior division of left middle cerebral artery followed by the microcatheter. The guidewire was removed. Good aspiration was obtained from the hub of the microcatheter. A gentle contrast injection demonstrated antegrade flow  of contrast. At this time, a 4 mm x 40 mm Solitaire FR retrieval device was advanced in a coaxial manner and with constant heparinized saline infusion using biplane roadmap technique and constant fluoroscopic guidance to the distal portion of the microcatheter. At this time the entire system was straightened  by loosening the O ring on the delivery microcatheter. After having ascertained the distal and the proximal positioning of the retrieval device, with slight forward gentle traction with the right hand on the delivery micro guidewire, with the left hand the microcatheter was retrieved unsheathing the entire Solitaire FR retrieval device with the tip of the microcatheter just proximal to the proximal landing zone of the retrieval device. A control arteriogram performed through the 8 [place] FlowGate guide catheter demonstrated significantly improved caliber and flow through the middle cerebral artery distribution with a narrowing of the retrieval device in the distal M1 segment. At this time, the balloon of the [facility] guide catheter was inflated in the distal left internal carotid artery for proximal flow arrest. The proximal portion of the retrieval device was captured into the microcatheter. There on after whilst aspirating with a 60 mL syringe at the hub of the [facility] guide catheter, the combination of the retrieval device and the microcatheter were gently retrieved and removed. The aspirate demonstrated prominent clot measuring approximately 2 mm x 4 mm. Aspiration was continued as the balloon was deflated in the left internal carotid artery. The FlowGate guide catheter was gently retrieved more proximally after deflation of the balloon. A control arteriogram performed through the 8 [place] FlowGate guide catheter demonstrated complete angiographic revascularization of the left occluded left middle cerebral artery with patency of the left posterior cerebral and left anterior cerebral arteries. Focal segmental spasm was noted of the left middle cerebral artery involving the inferior division in the distal M1 region. This responded to 4 aliquots of 25 mics of nitroglycerin intra-arterially. A final control arteriogram performed demonstrated significant improved caliber of the previously noted vasospasm of the left  middle cerebral artery, and also the left internal carotid artery mid cervical segment. Also noted of the final to control arteriograms a focal area of hyper density in the left centrum semiovale region. No evidence of angiographic extravasation or mass effect was seen. Patient's hemodynamic status, blood pressure and heart rate remained stable throughout the procedure. The 8 [place] FlowGate guide catheter and the 8 [place] neurovascular sheath were then retrieved into the abdominal aorta and exchanged over a J-tip guidewire for an 8 French Pinnacle sheath which in turn was successfully removed with the application of a closure device. The right groin showed no evidence of a hematoma or hemorrhage. The distal pulses in both feet remained Dopplerable unchanged from prior to the procedure. IMPRESSION: Status post endovascular complete revascularization of occluded left middle cerebral artery with 1 pass with the Solitaire FR 4 mm x 40 mm retrieval device, with achievement of a TICI 3 reperfusion. Groin puncture to initial reperfusion TICI2b 32 minutes. Groin puncture to TICI reperfusion 38 minutes PLAN: The patient was transferred to the CT scanner for postprocedural CT scan of the brain. Electronically Signed   By: Luanne Bras M.D.   On: 11/29/2016 14:42   Ir Angio Vertebral Sel Subclavian Innominate Uni R Mod Sed  Result Date: 11/30/2016 INDICATION: Patient presenting with aphasia and right-sided weakness with left gaze preference. 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  CBF < 30 % 5 ml,Tmax > 6 secs of 52 ml with a mismatch vol of 96ml. EXAM: 1. EMERGENT LARGE VESSEL OCCLUSION THROMBOLYSIS (anterior CIRCULATION) 2. COMPARISON:  CT angiogram of the head and neck, and CT perfusion study of 11/29/2016. MEDICATIONS: Vancomycin 1.5 mg IV was administered within 1 hour of the procedure. ANESTHESIA/SEDATION:  General anesthesia. CONTRAST:  Isovue 300 approximately 75 cc. FLUOROSCOPY TIME:  Fluoroscopy Time: 28 minutes 36 seconds (1539 mGy). COMPLICATIONS: None immediate. TECHNIQUE: Following a full explanation of the procedure along with the potential associated complications, an informed witnessed consent was obtained. The risks of intracranial hemorrhage of 10%, worsening neurological deficit, ventilator dependency, death and inability to revascularize were all reviewed in detail with the patient's . The patient was then put under general anesthesia by the Department of Anesthesiology at [facility]. The right groin was prepped and draped in the usual sterile fashion. Thereafter using modified Seldinger technique, transfemoral access into the right common femoral artery was obtained without difficulty. Over a 0.035 inch guidewire a 5 French Pinnacle sheath was inserted. Through this, and also over a 0.035 inch guidewire a 5 [place] JB 1 catheter was advanced to the aortic arch region and selectively positioned in the right common carotid artery and the left common carotid artery, and the innominate artery. FINDINGS: The right subclavian arteriogram demonstrates the right vertebral artery to opacify normally to the cranial skull base. Flow is noted into the right vertebrobasilar junction and the right posterior-inferior cerebellar artery. Flash opacification of the proximal basilar artery is also noted. The right common carotid arteriogram demonstrates the right external carotid artery and its major branches to be widely patent. The right internal carotid artery at the bulb to the cranial skull base opacifies widely. The petrous, the cavernous and the supraclinoid segments are widely patent. The right middle cerebral artery and the right anterior cerebral artery opacify into the capillary and venous phases. Flash opacification via the anterior communicating artery of the left anterior cerebral artery A2 segment  is also noted. Also noted is contrast blush in the region of the middle terminate of the right maxillary sinus most consistent with inflammatory reaction. The left common carotid arteriogram demonstrates the left external carotid artery and its major branches to be widely patent. The left internal carotid artery at the bulb to the cranial skull base opacifies widely. There are mild FMD-like changes noted in the mid left internal carotid artery mid cervical segment. The petrous, the cavernous and the supraclinoid segments demonstrate wide patency. A left posterior communicating artery is seen opacifying the left posterior cerebral artery distribution. The left anterior cerebral artery opacifies into the capillary and venous phases with flash filling via the anterior communicating artery of the right anterior cerebral artery A2 and A1 segments. The left middle cerebral artery demonstrates complete angiographic occlusion in its mid M1 segment with delayed opacification of an anterior temporal branch with prominent filling defects proximally. The M1 segment, otherwise, remains completely occluded. The delayed arterial phase in the lateral projection demonstrates partial retrograde opacification of the distal perisylvian branches with large area of hypoperfusion involving the lentiform nucleus and the caudate head. PROCEDURE: ENDOVASCULAR COMPLETE REVASCULARIZATION OF OCCLUDED LEFT MIDDLE CEREBRAL ARTERY M1 SEGMENT The diagnostic JB 1 catheter in the left common carotid artery was then exchanged over a 0.035 inch 300 cm Rosen exchange guidewire for an 8 [place] 55 cm Brite tip neurovascular sheath using biplane roadmap technique and constant fluoroscopic guidance. Good aspiration was obtained from the  side port of the neurovascular sheath. This was then connected to continuous heparinized saline infusion. Over the [employer] guidewire, an 8 [place] 85 cm FlowGate balloon guide catheter which had been prepped with 50%  contrast and 50% heparinized saline infusion was advanced and positioned in the distal left common carotid artery. The guidewire was removed. Good aspiration was obtained from the hub of the 8 [place] FlowGate guide catheter. A gentle contrast injection demonstrated no evidence of spasms, dissections or of intraluminal filling defects. At this time, using biplane roadmap technique and constant fluoroscopic guidance, over a 0.035 inch Roadrunner guidewire, the 8 [place] FlowGate guide catheter was then advanced to the distal cervical left ICA without difficulty. Good aspiration was obtained after removal of the wire. A gentle control arteriogram demonstrated mild spasm with no impediment of distal flow. Patient was treated with 1 aliquots of 25 mics of nitroglycerin with relief of the vasospasm. At this time, in a coaxial manner and with constant heparinized saline infusion using biplane roadmap technique and constant fluoroscopic guidance, a Trevo ProVue 021 microcatheter was advanced over a 0.014 inch Softip Synchro micro guidewire to the distal end of the FlowGate guide catheter. With the micro guidewire leading with a J-tip configuration the combination was navigated to the supraclinoid right ICA. A torque device was then used to manipulate the guidewire into the left middle cerebral artery followed by the microcatheter. The micro guidewire was then advanced without difficulty into the M2 M3 region of the inferior division of left middle cerebral artery followed by the microcatheter. The guidewire was removed. Good aspiration was obtained from the hub of the microcatheter. A gentle contrast injection demonstrated antegrade flow of contrast. At this time, a 4 mm x 40 mm Solitaire FR retrieval device was advanced in a coaxial manner and with constant heparinized saline infusion using biplane roadmap technique and constant fluoroscopic guidance to the distal portion of the microcatheter. At this time the entire system  was straightened by loosening the O ring on the delivery microcatheter. After having ascertained the distal and the proximal positioning of the retrieval device, with slight forward gentle traction with the right hand on the delivery micro guidewire, with the left hand the microcatheter was retrieved unsheathing the entire Solitaire FR retrieval device with the tip of the microcatheter just proximal to the proximal landing zone of the retrieval device. A control arteriogram performed through the 8 [place] FlowGate guide catheter demonstrated significantly improved caliber and flow through the middle cerebral artery distribution with a narrowing of the retrieval device in the distal M1 segment. At this time, the balloon of the [facility] guide catheter was inflated in the distal left internal carotid artery for proximal flow arrest. The proximal portion of the retrieval device was captured into the microcatheter. There on after whilst aspirating with a 60 mL syringe at the hub of the [facility] guide catheter, the combination of the retrieval device and the microcatheter were gently retrieved and removed. The aspirate demonstrated prominent clot measuring approximately 2 mm x 4 mm. Aspiration was continued as the balloon was deflated in the left internal carotid artery. The FlowGate guide catheter was gently retrieved more proximally after deflation of the balloon. A control arteriogram performed through the 8 [place] FlowGate guide catheter demonstrated complete angiographic revascularization of the left occluded left middle cerebral artery with patency of the left posterior cerebral and left anterior cerebral arteries. Focal segmental spasm was noted of the left middle cerebral artery involving the inferior division in  the distal M1 region. This responded to 4 aliquots of 25 mics of nitroglycerin intra-arterially. A final control arteriogram performed demonstrated significant improved caliber of the previously noted  vasospasm of the left middle cerebral artery, and also the left internal carotid artery mid cervical segment. Also noted of the final to control arteriograms a focal area of hyper density in the left centrum semiovale region. No evidence of angiographic extravasation or mass effect was seen. Patient's hemodynamic status, blood pressure and heart rate remained stable throughout the procedure. The 8 [place] FlowGate guide catheter and the 8 [place] neurovascular sheath were then retrieved into the abdominal aorta and exchanged over a J-tip guidewire for an 8 [place] Pinnacle sheath which in turn was successfully removed with the application of a closure device. The right groin showed no evidence of a hematoma or hemorrhage. The distal pulses in both feet remained Dopplerable unchanged from prior to the procedure. IMPRESSION: Status post endovascular complete revascularization of occluded left middle cerebral artery with 1 pass with the Solitaire FR 4 mm x 40 mm retrieval device, with achievement of a TICI 3 reperfusion. Groin puncture to initial reperfusion TICI2b 32 minutes. Groin puncture to TICI reperfusion 38 minutes PLAN: The patient was transferred to the CT scanner for postprocedural CT scan of the brain. Electronically Signed   By: Luanne Bras M.D.   On: 11/29/2016 14:42     STUDIES:  Head CT 5/13: Large area of hemorrhage and extravasated contrast in the left basal ganglia, blood volume 39 mL. Hemorrhagic transformation of left temporal infarct. Mild amount of subdural hematoma along the tentorium bilaterally.  6 mm midline shift to the right.  CULTURES: None  ANTIBIOTICS: None  SIGNIFICANT EVENTS: 5/13: intubated for IR procedure  LINES/TUBES: ETT 5/13>>>  DISCUSSION: 76 year old female with PMH above presenting with a CVA and was intubated for IR procedure.  PCCM consulted for vent management.  I reviewed CXR myself, ETT in good position.  ASSESSMENT /  PLAN:  PULMONARY A: Acute respiratory failure post CVA.  Weaning well. P:   - MRI today. - Wean to extubate post MRI. - Monitor for airway protection. - Titrate O2 for sat of 88-92%.  CARDIOVASCULAR A:  Hypertension. P:  - Tele monitoring - Monitor BP closely per neurology recommendations. - Cleviprex - Propofol  RENAL A:   Hypokalemia Elevated but improving Cr P:   - Replace electrolytes as indicated. - BMET in AM. - NS 100 ml/hr.  GASTROINTESTINAL A:   No active issues P:   - NPO - SLP post extubation  HEMATOLOGIC A:   Leukocytosis likely stress response P:  - CBC in AM - Titrate per ICU protocol  INFECTIOUS A:   No active issues P:   - Monitor WBC and fever curve  ENDOCRINE A:   Hypothyroidism   P:   - TSH and free T4 check - Thyroxin 44 mcg IV  NEUROLOGIC A:   CVA Sedated and intubated P:   RASS goal: 0 - Propofol - Wean for extubation post MRI - MRI today per neurology - BP control  FAMILY  - Updates: Daughter updated bedside  - Inter-disciplinary family meet or Palliative Care meeting due by:  day 7  The patient is critically ill with multiple organ systems failure and requires high complexity decision making for assessment and support, frequent evaluation and titration of therapies, application of advanced monitoring technologies and extensive interpretation of multiple databases.   Critical Care Time devoted to patient care services  described in this note is  45  Minutes. This time reflects time of care of this signee Dr Jennet Maduro. This critical care time does not reflect procedure time, or teaching time or supervisory time of PA/NP/Med student/Med Resident etc but could involve care discussion time.  Rush Farmer, M.D. [facility] Pulmonary/Critical Care Medicine. Pager: [phone]. After hours pager: [phone].  11/30/2016, 9:25 AM

## 2016-11-30 NOTE — Progress Notes (Signed)
PT Cancellation Note  Patient Details Name: Valerie Mcclure MRN: [id number] DOB: 12/24/1940   Cancelled Treatment:    Reason Eval/Treat Not Completed: Patient not medically ready.  Patient is intubated and on strict bedrest.  Will initiate PT evaluation once patient is medically stable and activity orders increased.   Despina Pole 11/30/2016, 1:15 PM Carita Pian. Sanjuana Kava, [place] Pager [phone]

## 2016-11-30 NOTE — Progress Notes (Signed)
SLP Cancellation Note  Patient Details Name: Valerie Mcclure MRN: [id number] DOB: 03-22-1941   Cancelled treatment:       Reason Eval/Treat Not Completed: Medical issues which prohibited therapy. Pt currently orally intubated. Will recheck next date for appropriateness.  Valerie Mcclure B. Quentin Ore [facility], CCC-SLP [phone]  Shonna Chock 11/30/2016, 9:43 AM

## 2016-11-30 NOTE — Procedures (Signed)
Extubation Procedure Note  Patient Details:   Name: Valerie Mcclure DOB: [birth year] MRN: [id number]   Airway Documentation:     Evaluation  O2 sats: stable throughout Complications: No apparent complications Patient did tolerate procedure well. Bilateral Breath Sounds: Clear   Yes, Placed on 4L [place]  SPO2 100%.    Littie Deeds [facility] 11/30/2016, 3:07 PM

## 2016-12-01 ENCOUNTER — Other Ambulatory Visit (HOSPITAL_COMMUNITY): Payer: Medicare Other

## 2016-12-01 ENCOUNTER — Inpatient Hospital Stay (HOSPITAL_COMMUNITY): Payer: Medicare Other

## 2016-12-01 ENCOUNTER — Other Ambulatory Visit: Payer: Self-pay | Admitting: Internal Medicine

## 2016-12-01 DIAGNOSIS — I63312 Cerebral infarction due to thrombosis of left middle cerebral artery: Secondary | ICD-10-CM

## 2016-12-01 DIAGNOSIS — I491 Atrial premature depolarization: Secondary | ICD-10-CM

## 2016-12-01 DIAGNOSIS — I4891 Unspecified atrial fibrillation: Secondary | ICD-10-CM

## 2016-12-01 DIAGNOSIS — I351 Nonrheumatic aortic (valve) insufficiency: Secondary | ICD-10-CM

## 2016-12-01 DIAGNOSIS — I340 Nonrheumatic mitral (valve) insufficiency: Secondary | ICD-10-CM

## 2016-12-01 LAB — CBC
HEMATOCRIT: 31.8 % — AB (ref 36.0–46.0)
HEMOGLOBIN: 10.2 g/dL — AB (ref 12.0–15.0)
MCH: 29.7 pg (ref 26.0–34.0)
MCHC: 32.1 g/dL (ref 30.0–36.0)
MCV: 92.4 fL (ref 78.0–100.0)
Platelets: 219 10*3/uL (ref 150–400)
RBC: 3.44 MIL/uL — ABNORMAL LOW (ref 3.87–5.11)
RDW: 15.2 % (ref 11.5–15.5)
WBC: 17.3 10*3/uL — ABNORMAL HIGH (ref 4.0–10.5)

## 2016-12-01 LAB — BLOOD GAS, ARTERIAL
Acid-base deficit: 1.9 mmol/L (ref 0.0–2.0)
Bicarbonate: 21.6 mmol/L (ref 20.0–28.0)
DRAWN BY: 308607
FIO2: 0.36
O2 SAT: 98.3 %
PATIENT TEMPERATURE: 98.6
pCO2 arterial: 32.6 mmHg (ref 32.0–48.0)
pH, Arterial: 7.437 (ref 7.350–7.450)
pO2, Arterial: 147 mmHg — ABNORMAL HIGH (ref 83.0–108.0)

## 2016-12-01 LAB — BASIC METABOLIC PANEL
Anion gap: 8 (ref 5–15)
BUN: 17 mg/dL (ref 6–20)
CHLORIDE: 114 mmol/L — AB (ref 101–111)
CO2: 22 mmol/L (ref 22–32)
CREATININE: 1.1 mg/dL — AB (ref 0.44–1.00)
Calcium: 8.2 mg/dL — ABNORMAL LOW (ref 8.9–10.3)
GFR calc Af Amer: 55 mL/min — ABNORMAL LOW (ref >60)
GFR calc non Af Amer: 47 mL/min — ABNORMAL LOW (ref >60)
GLUCOSE: 126 mg/dL — AB (ref 65–99)
Potassium: 3.4 mmol/L — ABNORMAL LOW (ref 3.5–5.1)
SODIUM: 144 mmol/L (ref 135–145)

## 2016-12-01 LAB — HEMOGLOBIN A1C
Hgb A1c MFr Bld: 5.8 % — ABNORMAL HIGH (ref 4.8–5.6)
Mean Plasma Glucose: 120 mg/dL

## 2016-12-01 LAB — PHOSPHORUS: Phosphorus: 1.9 mg/dL — ABNORMAL LOW (ref 2.5–4.6)

## 2016-12-01 LAB — MAGNESIUM: Magnesium: 2.1 mg/dL (ref 1.7–2.4)

## 2016-12-01 MED ORDER — RESOURCE THICKENUP CLEAR PO POWD
ORAL | Status: DC | PRN
  Filled 2016-12-01 (×2): qty 125

## 2016-12-01 MED ORDER — POTASSIUM CHLORIDE 10 MEQ/100ML IV SOLN
10.0000 meq | INTRAVENOUS | Status: AC
  Administered 2016-12-01 (×2): 10 meq via INTRAVENOUS
  Filled 2016-12-01 (×2): qty 100

## 2016-12-01 MED ORDER — PNEUMOCOCCAL VAC POLYVALENT 25 MCG/0.5ML IJ INJ
0.5000 mL | INJECTION | INTRAMUSCULAR | Status: DC
  Filled 2016-12-01: qty 0.5

## 2016-12-01 NOTE — Evaluation (Signed)
Physical Therapy Evaluation Patient Details Name: Valerie Mcclure MRN: [id number] DOB: 08-28-1940 Today's Date: 12/01/2016   History of Present Illness  Pt is a 76 y.o. female with history of chronic low back pain, HTN, HLD, hypothyroidism presenting with aphasia and right hemiplegia on awakening. She did not receive IV t-PA due to delay in arrival but was taken to interventional neuroradiology was she received  TICI3 revascularization of the occluded left MCA with mechanical thrombectomy. Patient with post IR hemorrhage. MRI showed moderate sized L MCA infarct with basal ganglia hematoma with slight intraventricular extension and cytotoxic edema with slight 7 mm of midline shift. She was intubated 5/13-5/14.   Clinical Impression  Pt admitted with/for L MCA infarct with R side hemiplegia and aphasia.  Presently pt needs mod to max assist of 1-2 persons for basic mobility and short distance gait.Marland Kitchen  Pt currently limited functionally due to the problems listed. ( See problems list.)   Pt will benefit from PT to maximize function and safety in order to get ready for next venue listed below.     Follow Up Recommendations CIR    Equipment Recommendations  Other (comment) (TBA)    Recommendations for Other Services Rehab consult     Precautions / Restrictions Precautions Precautions: Fall      Mobility  Bed Mobility Overal bed mobility: Needs Assistance Bed Mobility: Supine to Sit     Supine to sit: Mod assist;+2 for physical assistance     General bed mobility comments: scoot to EOB with max for bridging, up via L elbow and scoot to EOB with mod, 2 person for safety..  At EOB, pt initially unsteady with R LE tending to extend and pt listing off to the Right.  First noted apraxias with pt having difficulty executing tasks ie bridges, scoots,  Transfers Overall transfer level: Needs assistance Equipment used: 2 person hand held assist Transfers: Sit to/from Stand Sit to Stand:  Mod assist;+2 physical assistance         General transfer comment: Cues for hand placement, assist to come both forward and up.  Some stability assist at trunk and stability at R knee  Ambulation/Gait Ambulation/Gait assistance: Max assist;+2 physical assistance Ambulation Distance (Feet): 7 Feet (including pivot turn) Assistive device: 2 person hand held assist Gait Pattern/deviations: Step-to pattern;Decreased step length - right;Decreased stance time - right;Narrow base of support (adducted on R)     General Gait Details: Hemiparetic on the R LE with adduction and ER.  Pt needed max assist to place R LE and moderate support at R knee for w/bearing.  Also needed w/shift and truncal support.  Stairs            Wheelchair Mobility    Modified Rankin (Stroke Patients Only) Modified Rankin (Stroke Patients Only) Pre-Morbid Rankin Score: No symptoms Modified Rankin: Moderately severe disability     Balance Overall balance assessment: Needs assistance Sitting-balance support: Single extremity supported;Feet supported Sitting balance-Leahy Scale: Poor Sitting balance - Comments: Tended to list R with R LE extending in response to her balance reactions   Standing balance support: Bilateral upper extremity supported;During functional activity Standing balance-Leahy Scale: Poor Standing balance comment: pt having difficulty w/shifting L and tended to list R needing truncal and R LE support.                             Pertinent Vitals/Pain Pain Assessment: Faces Faces Pain Scale: No hurt  Home Living Family/patient expects to be discharged to:: Private residence Living Arrangements: Spouse/significant other;Children Available Help at Discharge: Family Type of Home: House       Home Layout: One [place]:  (TBA) Additional Comments: pt able to answer via yes/no questions inconsistently.  No family present during eval     Prior Function Level  of Independence: Independent         Comments: Pt indicates she drives and was fully independent.  Family not available to confirm      Hand Dominance   Dominant Hand: Right    Extremity/Trunk Assessment   Upper Extremity Assessment Upper Extremity Assessment: RUE deficits/detail RUE Deficits / Details: PROM WFL.  She is able to initiate small excursion active flex/ext elbow and hand i RUE Sensation: decreased light touch RUE Coordination: decreased fine motor;decreased gross motor    Lower Extremity Assessment Lower Extremity Assessment: Defer to PT evaluation RLE Deficits / Details: gross strength in extension >=3/5, but uncoordinated movement and pt unable to execute to verbal command only.  Active movement at df/pf, gross flexion 3-/5, movement in synergy. RLE Coordination: decreased fine motor LLE Deficits / Details: WFL, but difficult to fully assess due to not following verbal cues well.  pt will mimick movement       Communication   Communication: Receptive difficulties;Expressive difficulties  Cognition Arousal/Alertness: Awake/alert Behavior During Therapy: Flat affect Overall Cognitive Status: Difficult to assess                                 General Comments: Pt follows one step commands consistently       General Comments General comments (skin integrity, edema, etc.): Noted motor apraxias.  Pt appeared to understand the command, but unable to execute.    Exercises     Assessment/Plan    PT Assessment Patient needs continued PT services  PT Problem List Decreased strength;Decreased activity tolerance;Decreased balance;Decreased mobility;Decreased coordination;Decreased safety awareness       PT Treatment Interventions Gait training;DME instruction;Functional mobility training;Therapeutic activities;Balance training;Neuromuscular re-education;Patient/family education    PT Goals (Current goals can be found in the Care Plan section)   Acute Rehab PT Goals Patient Stated Goal: Pt unable to state  PT Goal Formulation: With patient Time For Goal Achievement: 12/15/16 Potential to Achieve Goals: Good    Frequency Min 4X/week   Barriers to discharge        Co-evaluation               AM-PAC PT "6 Clicks" Daily Activity  Outcome Measure Difficulty turning over in bed (including adjusting bedclothes, sheets and blankets)?: Total Difficulty moving from lying on back to sitting on the side of the bed? : Total Difficulty sitting down on and standing up from a chair with arms (e.g., wheelchair, bedside commode, etc,.)?: Total Help needed moving to and from a bed to chair (including a wheelchair)?: A Lot Help needed walking in hospital room?: A Lot Help needed climbing 3-5 steps with a railing? : A Lot 6 Click Score: 9    End of Session   Activity Tolerance: Patient tolerated treatment well;Patient limited by fatigue Patient left: in chair;with call bell/phone within reach;with chair alarm set Nurse Communication: Mobility status PT Visit Diagnosis: Hemiplegia and hemiparesis;Apraxia (R48.2) Hemiplegia - Right/Left: Right Hemiplegia - dominant/non-dominant: Dominant Hemiplegia - caused by: Cerebral infarction    Time: 5053-9767 PT Time Calculation (min) (ACUTE ONLY): 33 min  Charges:   PT Evaluation $PT Eval Moderate Complexity: 1 Procedure     PT G Codes:        Dec 13, 2016  Donnella Sham, PT [phone]  (pager)  Tessie Fass Ormand Senn 12/13/2016, 2:57 PM

## 2016-12-01 NOTE — Consult Note (Signed)
Physical Medicine and Rehabilitation Consult  Reason for Consult: Right sided weakness, dysarthria and dysphagia Referring Physician: Dr. Nelda Marseille.   HPI: Valerie Mcclure is a 76 y.o. female with history of chronic LBP, CKD, hyperlipidemia who fell out of bed on 5/13 am and was found to have left facial weakness with slurred speech and right sided weakness. CT head done revealing hyperdense L-MCA sign with hypodensity in left temporal area. CTA showed left M1 occlusion due to acute thrombus or embolus with significant penumbra left temporoparietal lobe and irregular non-calcified plaque L-ICA. She underwent cerebral angio with complete revascularization of L-MCA with reperfusion. Follow up CCT showed hemorrhagic transformation of left temporal infarct, large area of hemorrhage and extravasated contrast in left basal ganglia,  minimal SDH along tentorium and 6 mm midline shift to right. MRI brain  L-MCA infarct with diffusion restriction left temporal lobe, superimposed 5.5 cm intra-axial hemorrhage centered at left basal ganglia with associated left hemisphere edema, effaced left lateral ventricle with trace IVH and occasional small foci of restriction in left thalamus and anterior left occipital lobe.    She was extubated 5/14 pm and therapy evaluations completed yesterday. Dr. Acie Fredrickson consulted for input on new onset A fib on telemetry. EKG without evidence of A fib and felt that patient might be having bouts of A fib due to stress v/s sinus arrhthymias. Question event monitor v/s loop to evaluate for arrhthymias.  BSS showed signs of dysphagia and concerns for aspiration--NPO recommended. Speech evaluation revealed moderate dysarthria with expressive > receptive aphasia with phonemic paraphasias and anomia. CIR recommended for follow up therapy.    Review of Systems  Constitutional: Positive for malaise/fatigue.  HENT: Negative for hearing loss.   Eyes: Negative for blurred vision and  double vision.  Respiratory: Negative for shortness of breath.   Cardiovascular: Negative for chest pain and palpitations.  Musculoskeletal: Positive for back pain and myalgias.  Neurological: Positive for speech change, focal weakness and weakness. Negative for dizziness and headaches.  Psychiatric/Behavioral: Positive for memory loss.      Past Medical History:  Diagnosis Date  . Blood transfusion without reported diagnosis   . Chronic low back pain   . Hyperlipidemia   . Osteopenia   . Unspecified essential hypertension   . Unspecified hypothyroidism     Past Surgical History:  Procedure Laterality Date  . ABDOMINAL HYSTERECTOMY  1970  . CHOLECYSTECTOMY  07/2009   Dr. Rise Patience  . COLONOSCOPY  2003  . FLEXIBLE SIGMOIDOSCOPY  2010  . HAND SURGERY    . IR ANGIO INTRA EXTRACRAN SEL COM CAROTID INNOMINATE UNI L MOD SED  11/29/2016  . IR ANGIO VERTEBRAL SEL SUBCLAVIAN INNOMINATE UNI R MOD SED  11/29/2016  . IR PERCUTANEOUS ART THROMBECTOMY/INFUSION INTRACRANIAL INC DIAG ANGIO  11/29/2016  . LUMBAR LAMINECTOMY  11/2008   Done by Dr. Patrice Paradise  . RADIOLOGY WITH ANESTHESIA N/A 11/29/2016   Procedure: RADIOLOGY WITH ANESTHESIA;  Surgeon: Radiologist, Medication, MD;  Location: [facility];  Service: Radiology;  Laterality: N/A;  . THYROIDECTOMY      Family History  Problem Relation Age of Onset  . Heart disease Father 33       MI age 23s  . Lung cancer Brother 56  . Colon cancer Neg Hx   . Esophageal cancer Neg Hx   . Rectal cancer Neg Hx   . Stomach cancer Neg Hx     Social History:  Married. 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 Her  husband had heart attack last week. Daughter lives with them and assists as needed. She reports that she has never smoked. She has never used smokeless tobacco. She reports that she does not drink alcohol or use drugs.    Allergies  Allergen Reactions  . Shrimp [Shellfish Allergy]     Break outs and swelling   . Tandem Plus [Fefum-Fepo-Fa-B  Cmp-C-Zn-Mn-Cu] Nausea And Vomiting  . Ivp Dye [Iodinated Diagnostic Agents] Rash    itching  . Penicillins Rash    itching    Medications Prior to Admission  Medication Sig Dispense Refill  . estradiol (ESTRACE) 0.5 MG tablet TAKE 1 TABLET(0.5 MG) BY MOUTH DAILY 90 tablet 2  . FLUoxetine (PROZAC) 20 MG capsule Take 1 capsule (20 mg total) by mouth daily. (Patient taking differently: Take 40 mg by mouth daily. ) 30 capsule 11  . furosemide (LASIX) 20 MG tablet TAKE 1 TABLET BY MOUTH DAILY AS NEEDED 90 tablet 1  . levothyroxine (SYNTHROID, LEVOTHROID) 75 MCG tablet Take 75 mcg by mouth daily before breakfast.    . metoprolol tartrate (LOPRESSOR) 25 MG tablet TAKE 1 TABLET(25 MG) BY MOUTH TWICE DAILY 180 tablet 2  . morphine (MS CONTIN) 15 MG 12 hr tablet Take 15 mg by mouth every 12 (twelve) hours.     Marland Kitchen NIFEdipine (PROCARDIA-XL/ADALAT-CC/NIFEDICAL-XL) 30 MG 24 hr tablet Take 30 mg by mouth daily.    . nortriptyline (PAMELOR) 10 MG capsule TAKE 2 CAPSULES BY MOUTH AT BEDTIME 180 capsule 1  . simvastatin (ZOCOR) 10 MG tablet TAKE 1 TABLET(10 MG) BY MOUTH DAILY 90 tablet 3  . valsartan (DIOVAN) 160 MG tablet Take 1 tablet (160 mg total) by mouth daily. 90 tablet 3  . vitamin B-12 (CYANOCOBALAMIN) 100 MCG tablet Take 100 mcg by mouth daily.    . Calcium Carbonate-Vit D-Min (CALCIUM 1200 PO) Take by mouth daily.      . Cholecalciferol (VITAMIN D3) 1000 UNITS CAPS Take by mouth daily.      Marland Kitchen levothyroxine (SYNTHROID, LEVOTHROID) 88 MCG tablet Take 1 tablet (88 mcg total) by mouth daily. 90 tablet 1    Home: Home Living Family/patient expects to be discharged to:: Private residence Living Arrangements: Spouse/significant other, Children  Functional History:   Functional Status:  Mobility:          ADL:    Cognition: Cognition Orientation Level: Oriented X4    Blood pressure (!) 139/54, pulse 80, temperature 98.1 F (36.7 C), temperature source Oral, resp. rate (!) 21, height  5\' 3"  (1.6 m), weight 79.5 kg (175 lb 4.3 oz), SpO2 100 %. Physical Exam  Nursing note and vitals reviewed. Constitutional: She appears well-developed and well-nourished. She is easily aroused. No distress. Nasal cannula in place.  Sleeping but easily aroused-- yawning frequently.   HENT:  Head: Normocephalic and atraumatic.  Eyes: Conjunctivae are normal. Pupils are equal, round, and reactive to light.  Neck: Normal range of motion. Neck supple.  Cardiovascular: Normal rate and regular rhythm.   Respiratory: Effort normal and breath sounds normal. No stridor. No respiratory distress. She has no wheezes.  GI: Soft. Bowel sounds are normal. She exhibits no distension. There is no tenderness.  Musculoskeletal:  Edema right hand.   Neurological: She is easily aroused.  Sleepy but able to follow simple one step motor commands most of the time. Right facial weakness with dysarthria. Y/N biographic question were 50% accurate as daughter corrected frequently. RUE> RLE weakness.    Skin: Skin is warm and  dry. She is not diaphoretic.  Psychiatric: She has a normal mood and affect. Her speech is slurred. She is slowed. Cognition and memory are impaired.  Fluent aphasia, Wernicke's, unable to repeat words Right  Motor strength is 3 minus at the finger flexors 3 minus at the biceps and triceps, 0 at the deltoid, 0 at the finger extensors, 3 plus in the right hip flexor, knee extensor, ankle dorsiflexor Left upper and lower extremity strength  Normal   Results for orders placed or performed during the hospital encounter of 11/29/16 (from the past 24 hour(s))  T4, free     Status: Abnormal   Collection Time: 11/30/16  1:46 PM  Result Value Ref Range   Free T4 1.36 (H) 0.61 - 1.12 ng/dL  TSH     Status: None   Collection Time: 11/30/16  1:46 PM  Result Value Ref Range   TSH 1.076 0.350 - 4.500 uIU/mL  CBC     Status: Abnormal   Collection Time: 12/01/16  2:29 AM  Result Value Ref Range   WBC 17.3  (H) 4.0 - 10.5 K/uL   RBC 3.44 (L) 3.87 - 5.11 MIL/uL   Hemoglobin 10.2 (L) 12.0 - 15.0 g/dL   HCT 31.8 (L) 36.0 - 46.0 %   MCV 92.4 78.0 - 100.0 fL   MCH 29.7 26.0 - 34.0 pg   MCHC 32.1 30.0 - 36.0 g/dL   RDW 15.2 11.5 - 15.5 %   Platelets 219 150 - 400 K/uL  Basic metabolic panel     Status: Abnormal   Collection Time: 12/01/16  2:29 AM  Result Value Ref Range   Sodium 144 135 - 145 mmol/L   Potassium 3.4 (L) 3.5 - 5.1 mmol/L   Chloride 114 (H) 101 - 111 mmol/L   CO2 22 22 - 32 mmol/L   Glucose, Bld 126 (H) 65 - 99 mg/dL   BUN 17 6 - 20 mg/dL   Creatinine, Ser 1.10 (H) 0.44 - 1.00 mg/dL   Calcium 8.2 (L) 8.9 - 10.3 mg/dL   GFR calc non Af Amer 47 (L) >60 mL/min   GFR calc Af Amer 55 (L) >60 mL/min   Anion gap 8 5 - 15  Magnesium     Status: None   Collection Time: 12/01/16  2:29 AM  Result Value Ref Range   Magnesium 2.1 1.7 - 2.4 mg/dL  Phosphorus     Status: Abnormal   Collection Time: 12/01/16  2:29 AM  Result Value Ref Range   Phosphorus 1.9 (L) 2.5 - 4.6 mg/dL  Blood gas, arterial     Status: Abnormal   Collection Time: 12/01/16  3:51 AM  Result Value Ref Range   FIO2 0.36    Delivery systems NASAL CANNULA    pH, Arterial 7.437 7.350 - 7.450   pCO2 arterial 32.6 32.0 - 48.0 mmHg   pO2, Arterial 147 (H) 83.0 - 108.0 mmHg   Bicarbonate 21.6 20.0 - 28.0 mmol/L   Acid-base deficit 1.9 0.0 - 2.0 mmol/L   O2 Saturation 98.3 %   Patient temperature 98.6    Collection site RIGHT RADIAL    Drawn by [id number]    Sample type ARTERIAL    Allens test (pass/fail) PASS PASS   Mechanical Rate ARTERIAL    Ct Angio Head W Or Wo Contrast  Result Date: 11/29/2016 CLINICAL DATA:  Right facial droop and slurred speech. Last seen normal 10 p.m. last night. Stroke. EXAM: CT ANGIOGRAPHY HEAD AND NECK CT  PERFUSION BRAIN TECHNIQUE: Multidetector CT imaging of the head and neck was performed using the standard protocol during bolus administration of intravenous contrast. Multiplanar CT  image reconstructions and MIPs were obtained to evaluate the vascular anatomy. Carotid stenosis measurements (when applicable) are obtained utilizing NASCET criteria, using the distal internal carotid diameter as the denominator. Multiphase CT imaging of the brain was performed following IV bolus contrast injection. Subsequent parametric perfusion maps were calculated using RAPID software. CONTRAST:  80 mL Isovue 370 IV COMPARISON:  CT head 11/29/2016 FINDINGS: CTA NECK FINDINGS Aortic arch: Mild atherosclerotic disease in the aortic arch. Proximal great vessels widely patent. Right carotid system: Mild atherosclerotic disease at the right carotid bifurcation. No significant carotid stenosis. Left carotid system: Noncalcified plaque along the medial wall of the left internal carotid artery with mild irregularity but no definite ulceration. Less than 25% diameter stenosis of the left internal carotid artery. Left external carotid artery patent. Vertebral arteries: Both vertebral arteries patent to the basilar without significant stenosis. Skeleton: Moderate cervical spine degenerative change. No acute skeletal abnormality. Other neck: 10 mm rim calcified nodule in the right lobe of the thyroid, with a benign appearance. Smaller partially calcified nodule in the right upper lobe of the thyroid also likely benign. Upper chest: Mild mosaic pattern of lung density in the right lung apex. Question mild edema. Review of the MIP images confirms the above findings CTA HEAD FINDINGS Anterior circulation: Mild atherosclerotic disease in the cavernous carotid bilaterally which is calcified but non stenotic. Left M1 segment occluded. This is hyperdense on CT and appears acute. There is perfusion of the left MCA branches due to collaterals. Posterior circulation: Both vertebral arteries patent to the basilar. PICA patent bilaterally. Basilar patent. Superior cerebellar and posterior cerebral arteries patent bilaterally without  stenosis. Venous sinuses: Patent Anatomic variants: None Delayed phase: Not performed Review of the MIP images confirms the above findings CT Brain Perfusion Findings: CBF (<30%) Volume: 5 mLmL. However, review of the head CT of earlier today, there is hypodensity left temporal lobe compatible with acute infarct which is larger than indicated on CT perfusion. Perfusion (Tmax>6.0s) volume: 52 mLmL Perfusion (T-max greater than 4 seconds) volume:  99 mL Mismatch Volume: 47 mL.ML. Review of the perfusion images reveals significant penumbra greater than the calculated amount involving the left temporal parietal lobe. 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 Hypodensity left lower temporal lobe compatible with acute infarct. There is significant penumbra in the left temporoparietal lobe. Irregular noncalcified plaque left internal carotid artery could be a source of emboli or not the high non stenotic atherosclerotic disease. No significant stenosis in the carotid or vertebral arteries in the neck. Atherosclerotic calcification in the cavernous carotid bilaterally. These results were called by telephone at the time of interpretation on 11/29/2016 at 10:50 Am to Dr. Rogue Jury , who verbally acknowledged these results. Electronically Signed   By: Franchot Gallo M.D.   On: 11/29/2016 11:11   Ct Head Wo Contrast  Result Date: 11/29/2016 CLINICAL DATA:  Stroke post thrombectomy EXAM: CT HEAD WITHOUT CONTRAST TECHNIQUE: Contiguous axial images were obtained from the base of the skull through the vertex without intravenous contrast. COMPARISON:  CT head 11/29/2016 FINDINGS: Brain: High-density within the left putamen and left caudate compatible with acute hemorrhage. There is also high density in the superior basal ganglia compatible with extravasation of contrast. The high density area is most likely a mixture of blood and contrast. This  area  measures approximately 6.1 x 3.5 x 3.7 cm, acute blood volume 39 mL. Hemorrhagic transformation of left temporal lobe infarct. Compression of the left lateral ventricle due to mass-effect. 6 mm midline shift to the right. Interval development of subdural hemorrhage along the tentorium bilaterally. Vascular: Normal arterial and venous enhancement post angiography. Skull: Negative for fracture. Sinuses/Orbits: Mucosal edema in the paranasal sinuses. Normal orbit. Other: None IMPRESSION: Large area of hemorrhage and extravasated contrast in the left basal ganglia, blood volume 39 mL. Hemorrhagic transformation of left temporal infarct. Mild amount of subdural hematoma along the tentorium bilaterally. 6 mm midline shift to the right. These results were called by telephone at the time of interpretation on 11/29/2016 at 2:20 pm to Dr. Luanne Bras , who verbally acknowledged these results. Electronically Signed   By: Franchot Gallo M.D.   On: 11/29/2016 14:21   Ct Angio Neck W Or Wo Contrast  Result Date: 11/29/2016 CLINICAL DATA:  Right facial droop and slurred speech. Last seen normal 10 p.m. last night. Stroke. EXAM: CT ANGIOGRAPHY HEAD AND NECK CT PERFUSION BRAIN TECHNIQUE: Multidetector CT imaging of the head and neck was performed using the standard protocol during bolus administration of intravenous contrast. Multiplanar CT image reconstructions and MIPs were obtained to evaluate the vascular anatomy. Carotid stenosis measurements (when applicable) are obtained utilizing NASCET criteria, using the distal internal carotid diameter as the denominator. Multiphase CT imaging of the brain was performed following IV bolus contrast injection. Subsequent parametric perfusion maps were calculated using RAPID software. CONTRAST:  80 mL Isovue 370 IV COMPARISON:  CT head 11/29/2016 FINDINGS: CTA NECK FINDINGS Aortic arch: Mild atherosclerotic disease in the aortic arch. Proximal great vessels widely patent. Right  carotid system: Mild atherosclerotic disease at the right carotid bifurcation. No significant carotid stenosis. Left carotid system: Noncalcified plaque along the medial wall of the left internal carotid artery with mild irregularity but no definite ulceration. Less than 25% diameter stenosis of the left internal carotid artery. Left external carotid artery patent. Vertebral arteries: Both vertebral arteries patent to the basilar without significant stenosis. Skeleton: Moderate cervical spine degenerative change. No acute skeletal abnormality. Other neck: 10 mm rim calcified nodule in the right lobe of the thyroid, with a benign appearance. Smaller partially calcified nodule in the right upper lobe of the thyroid also likely benign. Upper chest: Mild mosaic pattern of lung density in the right lung apex. Question mild edema. Review of the MIP images confirms the above findings CTA HEAD FINDINGS Anterior circulation: Mild atherosclerotic disease in the cavernous carotid bilaterally which is calcified but non stenotic. Left M1 segment occluded. This is hyperdense on CT and appears acute. There is perfusion of the left MCA branches due to collaterals. Posterior circulation: Both vertebral arteries patent to the basilar. PICA patent bilaterally. Basilar patent. Superior cerebellar and posterior cerebral arteries patent bilaterally without stenosis. Venous sinuses: Patent Anatomic variants: None Delayed phase: Not performed Review of the MIP images confirms the above findings CT Brain Perfusion Findings: CBF (<30%) Volume: 5 mLmL. However, review of the head CT of earlier today, there is hypodensity left temporal lobe compatible with acute infarct which is larger than indicated on CT perfusion. Perfusion (Tmax>6.0s) volume: 52 mLmL Perfusion (T-max greater than 4 seconds) volume:  99 mL Mismatch Volume: 47 mL.ML. Review of the perfusion images reveals significant penumbra greater than the calculated amount involving the  left temporal parietal lobe. Infarction Location:Left temporoparietal lobe IMPRESSION: Left M1 occlusion due to acute thrombus or  embolus. Hypodensity left lower temporal lobe compatible with acute infarct. There is significant penumbra in the left temporoparietal lobe. Irregular noncalcified plaque left internal carotid artery could be a source of emboli or not the high non stenotic atherosclerotic disease. No significant stenosis in the carotid or vertebral arteries in the neck. Atherosclerotic calcification in the cavernous carotid bilaterally. These results were called by telephone at the time of interpretation on 11/29/2016 at 10:50 Am to Dr. Rogue Jury , who verbally acknowledged these results. Electronically Signed   By: Franchot Gallo M.D.   On: 11/29/2016 11:11   Mr Brain Wo Contrast  Result Date: 11/30/2016 CLINICAL DATA:  76 year old female who awoke with aphasia and right facial droop. Left M1 occlusion status post neuro endovascular revascularization. Subsequent large left hemisphere hemorrhage. EXAM: MRI HEAD WITHOUT CONTRAST TECHNIQUE: Multiplanar, multiecho pulse sequences of the brain and surrounding structures were obtained without intravenous contrast. COMPARISON:  Post endovascular Head CT 11/29/2016, and earlier. FINDINGS: Brain: Cortical and subcortical white matter restricted diffusion along the anterior left temporal tip and tracking posteriorly along the left superior and middle temporal gyri (Series 5, image 19). Superimposed intracranial hemorrhage centered at the left basal ganglia with T2 and T1 heterogeneous blood products encompassing 55 x 30 x 31 mm (AP by transverse by CC) for an estimated blood volume of 26 mL. Associated diffusion susceptibility in the area of the hemorrhage, but there also seems to be some marginal restricted diffusion. Small volume of intraventricular hemorrhage layering in the occipital horns. Effaced left lateral ventricle. No ventriculomegaly. Edema  surrounding the area of hemorrhage, and cytotoxic edema in the left temporal lobe. Rightward midline shift of 7 mm. Patent basilar cisterns. There are occasional small foci of restricted diffusion in the left PCA territory including the dorsal left thalamus and anterior left occipital lobe. No posterior fossa or contralateral right hemisphere diffusion restriction. Patchy nonspecific T2 hyperintensity in the pons. No cortical encephalomalacia. No chronic cerebral blood products. Negative pituitary and cervicomedullary junction. Vascular: Major intracranial vascular flow voids are preserved. Skull and upper cervical spine: Negative. Sinuses/Orbits: Normal orbits soft tissues. Visualized paranasal sinuses and mastoids are stable and well pneumatized. Other: Layering fluid in the pharynx.  Negative scalp soft tissues. IMPRESSION: 1. Left MCA infarct with the most confluent diffusion restriction in the left temporal lobe. Superimposed 5.5 cm intra-axial hemorrhage centered at the left basal ganglia with intra-axial blood volume estimated 26 mL. Associated left hemisphere edema. 2. Rightward midline shift of 7 mm.  Basilar cisterns remain patent. 3. Effaced left lateral ventricle and trace intraventricular hemorrhage without ventriculomegaly. 4. Occasional small foci of restricted diffusion also at the left PCA territory. Electronically Signed   By: Genevie Ann M.D.   On: 11/30/2016 13:59   Ct Cerebral Perfusion W Contrast  Result Date: 11/29/2016 CLINICAL DATA:  Right facial droop and slurred speech. Last seen normal 10 p.m. last night. Stroke. EXAM: CT ANGIOGRAPHY HEAD AND NECK CT PERFUSION BRAIN TECHNIQUE: Multidetector CT imaging of the head and neck was performed using the standard protocol during bolus administration of intravenous contrast. Multiplanar CT image reconstructions and MIPs were obtained to evaluate the vascular anatomy. Carotid stenosis measurements (when applicable) are obtained utilizing NASCET  criteria, using the distal internal carotid diameter as the denominator. Multiphase CT imaging of the brain was performed following IV bolus contrast injection. Subsequent parametric perfusion maps were calculated using RAPID software. CONTRAST:  80 mL Isovue 370 IV COMPARISON:  CT head 11/29/2016 FINDINGS: CTA NECK FINDINGS Aortic  arch: Mild atherosclerotic disease in the aortic arch. Proximal great vessels widely patent. Right carotid system: Mild atherosclerotic disease at the right carotid bifurcation. No significant carotid stenosis. Left carotid system: Noncalcified plaque along the medial wall of the left internal carotid artery with mild irregularity but no definite ulceration. Less than 25% diameter stenosis of the left internal carotid artery. Left external carotid artery patent. Vertebral arteries: Both vertebral arteries patent to the basilar without significant stenosis. Skeleton: Moderate cervical spine degenerative change. No acute skeletal abnormality. Other neck: 10 mm rim calcified nodule in the right lobe of the thyroid, with a benign appearance. Smaller partially calcified nodule in the right upper lobe of the thyroid also likely benign. Upper chest: Mild mosaic pattern of lung density in the right lung apex. Question mild edema. Review of the MIP images confirms the above findings CTA HEAD FINDINGS Anterior circulation: Mild atherosclerotic disease in the cavernous carotid bilaterally which is calcified but non stenotic. Left M1 segment occluded. This is hyperdense on CT and appears acute. There is perfusion of the left MCA branches due to collaterals. Posterior circulation: Both vertebral arteries patent to the basilar. PICA patent bilaterally. Basilar patent. Superior cerebellar and posterior cerebral arteries patent bilaterally without stenosis. Venous sinuses: Patent Anatomic variants: None Delayed phase: Not performed Review of the MIP images confirms the above findings CT Brain Perfusion  Findings: CBF (<30%) Volume: 5 mLmL. However, review of the head CT of earlier today, there is hypodensity left temporal lobe compatible with acute infarct which is larger than indicated on CT perfusion. Perfusion (Tmax>6.0s) volume: 52 mLmL Perfusion (T-max greater than 4 seconds) volume:  99 mL Mismatch Volume: 47 mL.ML. Review of the perfusion images reveals significant penumbra greater than the calculated amount involving the left temporal parietal lobe. 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 Hypodensity left lower temporal lobe compatible with acute infarct. There is significant penumbra in the left temporoparietal lobe. Irregular noncalcified plaque left internal carotid artery could be a source of emboli or not the high non stenotic atherosclerotic disease. No significant stenosis in the carotid or vertebral arteries in the neck. Atherosclerotic calcification in the cavernous carotid bilaterally. These results were called by telephone at the time of interpretation on 11/29/2016 at 10:50 Am to Dr. Rogue Jury , who verbally acknowledged these results. Electronically Signed   By: Franchot Gallo M.D.   On: 11/29/2016 11:11   Dg Chest Port 1 View  Result Date: 12/01/2016 CLINICAL DATA:  Stroke, history of hypertension, extubation of the trachea and esophagus. EXAM: PORTABLE CHEST 1 VIEW COMPARISON:  Portable chest x-ray of Nov 29, 2016. FINDINGS: The lungs are adequately inflated. There is no focal infiltrate. The retrocardiac lung markings on the left remain mildly prominent. There is no significant pleural effusion and there is no pneumothorax. The heart is top-normal in size. The pulmonary vascularity is normal. There is calcification in the wall of the thoracic aorta. IMPRESSION: Good inflation of both lungs since extubation. Minimal subsegmental atelectasis at the left lung base is suspected. No CHF. Thoracic aortic  atherosclerosis. Electronically Signed   By: David  Martinique M.D.   On: 12/01/2016 07:27   Dg Chest Port 1 View  Result Date: 11/30/2016 CLINICAL DATA:  Confirm orogastric tube placement. EXAM: PORTABLE CHEST 1 VIEW COMPARISON:  Chest radiograph earlier this day at 0907 hour FINDINGS: Endotracheal tube is 2.3 cm from the carina. Enteric tube in place, tip and side-port below the diaphragm. Low  lung volumes. Borderline cardiomegaly. No pulmonary edema. No large pleural effusion. No pneumothorax. IMPRESSION: Endotracheal tube approximately 2.3 cm from the carina. Enteric tube in place. Borderline cardiomegaly.  No new abnormality. Electronically Signed   By: Jeb Levering M.D.   On: 11/30/2016 00:28   Dg Abd Portable 1v  Result Date: 11/30/2016 CLINICAL DATA:  76 y/o  F; enteric tube placement. EXAM: PORTABLE ABDOMEN - 1 VIEW COMPARISON:  None. FINDINGS: Nonobstructive bowel gas pattern. Enteric tube tip projects over the stomach. Advanced lumbar levocurvature and lower lumbar fusion changes. IMPRESSION: Enteric tube tip projects over the mid stomach. Electronically Signed   By: Kristine Garbe M.D.   On: 11/30/2016 00:26   Ir Percutaneous Art Thrombectomy/infusion Intracranial Inc Diag Angio  Result Date: 11/30/2016 INDICATION: Patient presenting with aphasia and right-sided weakness with left gaze preference. 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 CBF < 30 % 5 ml,Tmax > 6 secs of 52 ml with a mismatch vol of 38ml. EXAM: 1. EMERGENT LARGE VESSEL OCCLUSION THROMBOLYSIS (anterior CIRCULATION) 2. COMPARISON:  CT angiogram of the head and neck, and CT perfusion study of 11/29/2016. MEDICATIONS: Vancomycin 1.5 mg IV was administered within 1 hour of the procedure. ANESTHESIA/SEDATION: General anesthesia. CONTRAST:  Isovue 300 approximately 75 cc. FLUOROSCOPY TIME:  Fluoroscopy Time: 28 minutes 36  seconds (1539 mGy). COMPLICATIONS: None immediate. TECHNIQUE: Following a full explanation of the procedure along with the potential associated complications, an informed witnessed consent was obtained. The risks of intracranial hemorrhage of 10%, worsening neurological deficit, ventilator dependency, death and inability to revascularize were all reviewed in detail with the patient's . The patient was then put under general anesthesia by the Department of Anesthesiology at [facility]. The right groin was prepped and draped in the usual sterile fashion. Thereafter using modified Seldinger technique, transfemoral access into the right common femoral artery was obtained without difficulty. Over a 0.035 inch guidewire a 5 French Pinnacle sheath was inserted. Through this, and also over a 0.035 inch guidewire a 5 [place] JB 1 catheter was advanced to the aortic arch region and selectively positioned in the right common carotid artery and the left common carotid artery, and the innominate artery. FINDINGS: The right subclavian arteriogram demonstrates the right vertebral artery to opacify normally to the cranial skull base. Flow is noted into the right vertebrobasilar junction and the right posterior-inferior cerebellar artery. Flash opacification of the proximal basilar artery is also noted. The right common carotid arteriogram demonstrates the right external carotid artery and its major branches to be widely patent. The right internal carotid artery at the bulb to the cranial skull base opacifies widely. The petrous, the cavernous and the supraclinoid segments are widely patent. The right middle cerebral artery and the right anterior cerebral artery opacify into the capillary and venous phases. Flash opacification via the anterior communicating artery of the left anterior cerebral artery A2 segment is also noted. Also noted is contrast blush in the region of the middle terminate of the right maxillary sinus most  consistent with inflammatory reaction. The left common carotid arteriogram demonstrates the left external carotid artery and its major branches to be widely patent. The left internal carotid artery at the bulb to the cranial skull base opacifies widely. There are mild FMD-like changes noted in the mid left internal carotid artery mid cervical segment. The petrous, the cavernous and the supraclinoid segments demonstrate wide  patency. A left posterior communicating artery is seen opacifying the left posterior cerebral artery distribution. The left anterior cerebral artery opacifies into the capillary and venous phases with flash filling via the anterior communicating artery of the right anterior cerebral artery A2 and A1 segments. The left middle cerebral artery demonstrates complete angiographic occlusion in its mid M1 segment with delayed opacification of an anterior temporal branch with prominent filling defects proximally. The M1 segment, otherwise, remains completely occluded. The delayed arterial phase in the lateral projection demonstrates partial retrograde opacification of the distal perisylvian branches with large area of hypoperfusion involving the lentiform nucleus and the caudate head. PROCEDURE: ENDOVASCULAR COMPLETE REVASCULARIZATION OF OCCLUDED LEFT MIDDLE CEREBRAL ARTERY M1 SEGMENT The diagnostic JB 1 catheter in the left common carotid artery was then exchanged over a 0.035 inch 300 cm Rosen exchange guidewire for an 8 French 55 cm Brite tip neurovascular sheath using biplane roadmap technique and constant fluoroscopic guidance. Good aspiration was obtained from the side port of the neurovascular sheath. This was then connected to continuous heparinized saline infusion. Over the [employer] guidewire, an 8 [place] 85 cm FlowGate balloon guide catheter which had been prepped with 50% contrast and 50% heparinized saline infusion was advanced and positioned in the distal left common carotid artery.  The guidewire was removed. Good aspiration was obtained from the hub of the 8 [place] FlowGate guide catheter. A gentle contrast injection demonstrated no evidence of spasms, dissections or of intraluminal filling defects. At this time, using biplane roadmap technique and constant fluoroscopic guidance, over a 0.035 inch Roadrunner guidewire, the 8 [place] FlowGate guide catheter was then advanced to the distal cervical left ICA without difficulty. Good aspiration was obtained after removal of the wire. A gentle control arteriogram demonstrated mild spasm with no impediment of distal flow. Patient was treated with 1 aliquots of 25 mics of nitroglycerin with relief of the vasospasm. At this time, in a coaxial manner and with constant heparinized saline infusion using biplane roadmap technique and constant fluoroscopic guidance, a Trevo ProVue 021 microcatheter was advanced over a 0.014 inch Softip Synchro micro guidewire to the distal end of the FlowGate guide catheter. With the micro guidewire leading with a J-tip configuration the combination was navigated to the supraclinoid right ICA. A torque device was then used to manipulate the guidewire into the left middle cerebral artery followed by the microcatheter. The micro guidewire was then advanced without difficulty into the M2 M3 region of the inferior division of left middle cerebral artery followed by the microcatheter. The guidewire was removed. Good aspiration was obtained from the hub of the microcatheter. A gentle contrast injection demonstrated antegrade flow of contrast. At this time, a 4 mm x 40 mm Solitaire FR retrieval device was advanced in a coaxial manner and with constant heparinized saline infusion using biplane roadmap technique and constant fluoroscopic guidance to the distal portion of the microcatheter. At this time the entire system was straightened by loosening the O ring on the delivery microcatheter. After having ascertained the distal and  the proximal positioning of the retrieval device, with slight forward gentle traction with the right hand on the delivery micro guidewire, with the left hand the microcatheter was retrieved unsheathing the entire Solitaire FR retrieval device with the tip of the microcatheter just proximal to the proximal landing zone of the retrieval device. A control arteriogram performed through the 8 [place] FlowGate guide catheter demonstrated significantly improved caliber and flow through the middle cerebral artery distribution with a  narrowing of the retrieval device in the distal M1 segment. At this time, the balloon of the [facility] guide catheter was inflated in the distal left internal carotid artery for proximal flow arrest. The proximal portion of the retrieval device was captured into the microcatheter. There on after whilst aspirating with a 60 mL syringe at the hub of the [facility] guide catheter, the combination of the retrieval device and the microcatheter were gently retrieved and removed. The aspirate demonstrated prominent clot measuring approximately 2 mm x 4 mm. Aspiration was continued as the balloon was deflated in the left internal carotid artery. The FlowGate guide catheter was gently retrieved more proximally after deflation of the balloon. A control arteriogram performed through the 8 [place] FlowGate guide catheter demonstrated complete angiographic revascularization of the left occluded left middle cerebral artery with patency of the left posterior cerebral and left anterior cerebral arteries. Focal segmental spasm was noted of the left middle cerebral artery involving the inferior division in the distal M1 region. This responded to 4 aliquots of 25 mics of nitroglycerin intra-arterially. A final control arteriogram performed demonstrated significant improved caliber of the previously noted vasospasm of the left middle cerebral artery, and also the left internal carotid artery mid cervical segment. Also  noted of the final to control arteriograms a focal area of hyper density in the left centrum semiovale region. No evidence of angiographic extravasation or mass effect was seen. Patient's hemodynamic status, blood pressure and heart rate remained stable throughout the procedure. The 8 [place] FlowGate guide catheter and the 8 [place] neurovascular sheath were then retrieved into the abdominal aorta and exchanged over a J-tip guidewire for an 8 French Pinnacle sheath which in turn was successfully removed with the application of a closure device. The right groin showed no evidence of a hematoma or hemorrhage. The distal pulses in both feet remained Dopplerable unchanged from prior to the procedure. IMPRESSION: Status post endovascular complete revascularization of occluded left middle cerebral artery with 1 pass with the Solitaire FR 4 mm x 40 mm retrieval device, with achievement of a TICI 3 reperfusion. Groin puncture to initial reperfusion TICI2b 32 minutes. Groin puncture to TICI reperfusion 38 minutes PLAN: The patient was transferred to the CT scanner for postprocedural CT scan of the brain. Electronically Signed   By: Luanne Bras M.D.   On: 11/29/2016 14:42   Ir Angio Intra Extracran Sel Com Carotid Innominate Uni L Mod Sed  Result Date: 11/30/2016 INDICATION: Patient presenting with aphasia and right-sided weakness with left gaze preference. 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 CBF < 30 % 5 ml,Tmax > 6 secs of 52 ml with a mismatch vol of 57ml. EXAM: 1. EMERGENT LARGE VESSEL OCCLUSION THROMBOLYSIS (anterior CIRCULATION) 2. COMPARISON:  CT angiogram of the head and neck, and CT perfusion study of 11/29/2016. MEDICATIONS: Vancomycin 1.5 mg IV was administered within 1 hour of the procedure. ANESTHESIA/SEDATION: General anesthesia. CONTRAST:  Isovue 300 approximately 75 cc. FLUOROSCOPY TIME:   Fluoroscopy Time: 28 minutes 36 seconds (1539 mGy). COMPLICATIONS: None immediate. TECHNIQUE: Following a full explanation of the procedure along with the potential associated complications, an informed witnessed consent was obtained. The risks of intracranial hemorrhage of 10%, worsening neurological deficit, ventilator dependency, death and inability to revascularize were all reviewed in detail with the patient's . The patient was then put under general anesthesia by the Department of Anesthesiology at [facility]  [place] right groin was prepped and draped in the usual sterile fashion. Thereafter using modified Seldinger technique, transfemoral access into the right common femoral artery was obtained without difficulty. Over a 0.035 inch guidewire a 5 French Pinnacle sheath was inserted. Through this, and also over a 0.035 inch guidewire a 5 [place] JB 1 catheter was advanced to the aortic arch region and selectively positioned in the right common carotid artery and the left common carotid artery, and the innominate artery. FINDINGS: The right subclavian arteriogram demonstrates the right vertebral artery to opacify normally to the cranial skull base. Flow is noted into the right vertebrobasilar junction and the right posterior-inferior cerebellar artery. Flash opacification of the proximal basilar artery is also noted. The right common carotid arteriogram demonstrates the right external carotid artery and its major branches to be widely patent. The right internal carotid artery at the bulb to the cranial skull base opacifies widely. The petrous, the cavernous and the supraclinoid segments are widely patent. The right middle cerebral artery and the right anterior cerebral artery opacify into the capillary and venous phases. Flash opacification via the anterior communicating artery of the left anterior cerebral artery A2 segment is also noted. Also noted is contrast blush in the region of the middle terminate  of the right maxillary sinus most consistent with inflammatory reaction. The left common carotid arteriogram demonstrates the left external carotid artery and its major branches to be widely patent. The left internal carotid artery at the bulb to the cranial skull base opacifies widely. There are mild FMD-like changes noted in the mid left internal carotid artery mid cervical segment. The petrous, the cavernous and the supraclinoid segments demonstrate wide patency. A left posterior communicating artery is seen opacifying the left posterior cerebral artery distribution. The left anterior cerebral artery opacifies into the capillary and venous phases with flash filling via the anterior communicating artery of the right anterior cerebral artery A2 and A1 segments. The left middle cerebral artery demonstrates complete angiographic occlusion in its mid M1 segment with delayed opacification of an anterior temporal branch with prominent filling defects proximally. The M1 segment, otherwise, remains completely occluded. The delayed arterial phase in the lateral projection demonstrates partial retrograde opacification of the distal perisylvian branches with large area of hypoperfusion involving the lentiform nucleus and the caudate head. PROCEDURE: ENDOVASCULAR COMPLETE REVASCULARIZATION OF OCCLUDED LEFT MIDDLE CEREBRAL ARTERY M1 SEGMENT The diagnostic JB 1 catheter in the left common carotid artery was then exchanged over a 0.035 inch 300 cm Rosen exchange guidewire for an 8 [place] 55 cm Brite tip neurovascular sheath using biplane roadmap technique and constant fluoroscopic guidance. Good aspiration was obtained from the side port of the neurovascular sheath. This was then connected to continuous heparinized saline infusion. Over the [employer] guidewire, an 8 [place] 85 cm FlowGate balloon guide catheter which had been prepped with 50% contrast and 50% heparinized saline infusion was advanced and positioned in the  distal left common carotid artery. The guidewire was removed. Good aspiration was obtained from the hub of the 8 [place] FlowGate guide catheter. A gentle contrast injection demonstrated no evidence of spasms, dissections or of intraluminal filling defects. At this time, using biplane roadmap technique and constant fluoroscopic guidance, over a 0.035 inch Roadrunner guidewire, the 8 [place] FlowGate guide catheter was then advanced to the distal cervical left ICA without difficulty. Good aspiration was obtained after removal of the wire. A gentle control arteriogram demonstrated mild spasm with no impediment of distal flow.  Patient was treated with 1 aliquots of 25 mics of nitroglycerin with relief of the vasospasm. At this time, in a coaxial manner and with constant heparinized saline infusion using biplane roadmap technique and constant fluoroscopic guidance, a Trevo ProVue 021 microcatheter was advanced over a 0.014 inch Softip Synchro micro guidewire to the distal end of the FlowGate guide catheter. With the micro guidewire leading with a J-tip configuration the combination was navigated to the supraclinoid right ICA. A torque device was then used to manipulate the guidewire into the left middle cerebral artery followed by the microcatheter. The micro guidewire was then advanced without difficulty into the M2 M3 region of the inferior division of left middle cerebral artery followed by the microcatheter. The guidewire was removed. Good aspiration was obtained from the hub of the microcatheter. A gentle contrast injection demonstrated antegrade flow of contrast. At this time, a 4 mm x 40 mm Solitaire FR retrieval device was advanced in a coaxial manner and with constant heparinized saline infusion using biplane roadmap technique and constant fluoroscopic guidance to the distal portion of the microcatheter. At this time the entire system was straightened by loosening the O ring on the delivery microcatheter. After  having ascertained the distal and the proximal positioning of the retrieval device, with slight forward gentle traction with the right hand on the delivery micro guidewire, with the left hand the microcatheter was retrieved unsheathing the entire Solitaire FR retrieval device with the tip of the microcatheter just proximal to the proximal landing zone of the retrieval device. A control arteriogram performed through the 8 [place] FlowGate guide catheter demonstrated significantly improved caliber and flow through the middle cerebral artery distribution with a narrowing of the retrieval device in the distal M1 segment. At this time, the balloon of the [facility] guide catheter was inflated in the distal left internal carotid artery for proximal flow arrest. The proximal portion of the retrieval device was captured into the microcatheter. There on after whilst aspirating with a 60 mL syringe at the hub of the [facility] guide catheter, the combination of the retrieval device and the microcatheter were gently retrieved and removed. The aspirate demonstrated prominent clot measuring approximately 2 mm x 4 mm. Aspiration was continued as the balloon was deflated in the left internal carotid artery. The FlowGate guide catheter was gently retrieved more proximally after deflation of the balloon. A control arteriogram performed through the 8 [place] FlowGate guide catheter demonstrated complete angiographic revascularization of the left occluded left middle cerebral artery with patency of the left posterior cerebral and left anterior cerebral arteries. Focal segmental spasm was noted of the left middle cerebral artery involving the inferior division in the distal M1 region. This responded to 4 aliquots of 25 mics of nitroglycerin intra-arterially. A final control arteriogram performed demonstrated significant improved caliber of the previously noted vasospasm of the left middle cerebral artery, and also the left internal carotid  artery mid cervical segment. Also noted of the final to control arteriograms a focal area of hyper density in the left centrum semiovale region. No evidence of angiographic extravasation or mass effect was seen. Patient's hemodynamic status, blood pressure and heart rate remained stable throughout the procedure. The 8 [place] FlowGate guide catheter and the 8 [place] neurovascular sheath were then retrieved into the abdominal aorta and exchanged over a J-tip guidewire for an 8 French Pinnacle sheath which in turn was successfully removed with the application of a closure device. The right groin showed no evidence of a hematoma  or hemorrhage. The distal pulses in both feet remained Dopplerable unchanged from prior to the procedure. IMPRESSION: Status post endovascular complete revascularization of occluded left middle cerebral artery with 1 pass with the Solitaire FR 4 mm x 40 mm retrieval device, with achievement of a TICI 3 reperfusion. Groin puncture to initial reperfusion TICI2b 32 minutes. Groin puncture to TICI reperfusion 38 minutes PLAN: The patient was transferred to the CT scanner for postprocedural CT scan of the brain. Electronically Signed   By: Luanne Bras M.D.   On: 11/29/2016 14:42   Ir Angio Vertebral Sel Subclavian Innominate Uni R Mod Sed  Result Date: 11/30/2016 INDICATION: Patient presenting with aphasia and right-sided weakness with left gaze preference. 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 CBF < 30 % 5 ml,Tmax > 6 secs of 52 ml with a mismatch vol of 52ml. EXAM: 1. EMERGENT LARGE VESSEL OCCLUSION THROMBOLYSIS (anterior CIRCULATION) 2. COMPARISON:  CT angiogram of the head and neck, and CT perfusion study of 11/29/2016. MEDICATIONS: Vancomycin 1.5 mg IV was administered within 1 hour of the procedure. ANESTHESIA/SEDATION: General anesthesia. CONTRAST:  Isovue 300 approximately 75 cc.  FLUOROSCOPY TIME:  Fluoroscopy Time: 28 minutes 36 seconds (1539 mGy). COMPLICATIONS: None immediate. TECHNIQUE: Following a full explanation of the procedure along with the potential associated complications, an informed witnessed consent was obtained. The risks of intracranial hemorrhage of 10%, worsening neurological deficit, ventilator dependency, death and inability to revascularize were all reviewed in detail with the patient's . The patient was then put under general anesthesia by the Department of Anesthesiology at [facility]. The right groin was prepped and draped in the usual sterile fashion. Thereafter using modified Seldinger technique, transfemoral access into the right common femoral artery was obtained without difficulty. Over a 0.035 inch guidewire a 5 French Pinnacle sheath was inserted. Through this, and also over a 0.035 inch guidewire a 5 [place] JB 1 catheter was advanced to the aortic arch region and selectively positioned in the right common carotid artery and the left common carotid artery, and the innominate artery. FINDINGS: The right subclavian arteriogram demonstrates the right vertebral artery to opacify normally to the cranial skull base. Flow is noted into the right vertebrobasilar junction and the right posterior-inferior cerebellar artery. Flash opacification of the proximal basilar artery is also noted. The right common carotid arteriogram demonstrates the right external carotid artery and its major branches to be widely patent. The right internal carotid artery at the bulb to the cranial skull base opacifies widely. The petrous, the cavernous and the supraclinoid segments are widely patent. The right middle cerebral artery and the right anterior cerebral artery opacify into the capillary and venous phases. Flash opacification via the anterior communicating artery of the left anterior cerebral artery A2 segment is also noted. Also noted is contrast blush in the region of the  middle terminate of the right maxillary sinus most consistent with inflammatory reaction. The left common carotid arteriogram demonstrates the left external carotid artery and its major branches to be widely patent. The left internal carotid artery at the bulb to the cranial skull base opacifies widely. There are mild FMD-like changes noted in the mid left internal carotid artery mid cervical segment. The petrous, the cavernous and the supraclinoid segments demonstrate wide patency. A left posterior communicating artery is seen opacifying the left posterior cerebral artery distribution. The left anterior cerebral artery opacifies into  the capillary and venous phases with flash filling via the anterior communicating artery of the right anterior cerebral artery A2 and A1 segments. The left middle cerebral artery demonstrates complete angiographic occlusion in its mid M1 segment with delayed opacification of an anterior temporal branch with prominent filling defects proximally. The M1 segment, otherwise, remains completely occluded. The delayed arterial phase in the lateral projection demonstrates partial retrograde opacification of the distal perisylvian branches with large area of hypoperfusion involving the lentiform nucleus and the caudate head. PROCEDURE: ENDOVASCULAR COMPLETE REVASCULARIZATION OF OCCLUDED LEFT MIDDLE CEREBRAL ARTERY M1 SEGMENT The diagnostic JB 1 catheter in the left common carotid artery was then exchanged over a 0.035 inch 300 cm Rosen exchange guidewire for an 8 French 55 cm Brite tip neurovascular sheath using biplane roadmap technique and constant fluoroscopic guidance. Good aspiration was obtained from the side port of the neurovascular sheath. This was then connected to continuous heparinized saline infusion. Over the [employer] guidewire, an 8 [place] 85 cm FlowGate balloon guide catheter which had been prepped with 50% contrast and 50% heparinized saline infusion was advanced and  positioned in the distal left common carotid artery. The guidewire was removed. Good aspiration was obtained from the hub of the 8 [place] FlowGate guide catheter. A gentle contrast injection demonstrated no evidence of spasms, dissections or of intraluminal filling defects. At this time, using biplane roadmap technique and constant fluoroscopic guidance, over a 0.035 inch Roadrunner guidewire, the 8 [place] FlowGate guide catheter was then advanced to the distal cervical left ICA without difficulty. Good aspiration was obtained after removal of the wire. A gentle control arteriogram demonstrated mild spasm with no impediment of distal flow. Patient was treated with 1 aliquots of 25 mics of nitroglycerin with relief of the vasospasm. At this time, in a coaxial manner and with constant heparinized saline infusion using biplane roadmap technique and constant fluoroscopic guidance, a Trevo ProVue 021 microcatheter was advanced over a 0.014 inch Softip Synchro micro guidewire to the distal end of the FlowGate guide catheter. With the micro guidewire leading with a J-tip configuration the combination was navigated to the supraclinoid right ICA. A torque device was then used to manipulate the guidewire into the left middle cerebral artery followed by the microcatheter. The micro guidewire was then advanced without difficulty into the M2 M3 region of the inferior division of left middle cerebral artery followed by the microcatheter. The guidewire was removed. Good aspiration was obtained from the hub of the microcatheter. A gentle contrast injection demonstrated antegrade flow of contrast. At this time, a 4 mm x 40 mm Solitaire FR retrieval device was advanced in a coaxial manner and with constant heparinized saline infusion using biplane roadmap technique and constant fluoroscopic guidance to the distal portion of the microcatheter. At this time the entire system was straightened by loosening the O ring on the delivery  microcatheter. After having ascertained the distal and the proximal positioning of the retrieval device, with slight forward gentle traction with the right hand on the delivery micro guidewire, with the left hand the microcatheter was retrieved unsheathing the entire Solitaire FR retrieval device with the tip of the microcatheter just proximal to the proximal landing zone of the retrieval device. A control arteriogram performed through the 8 [place] FlowGate guide catheter demonstrated significantly improved caliber and flow through the middle cerebral artery distribution with a narrowing of the retrieval device in the distal M1 segment. At this time, the balloon of the [facility] guide catheter was inflated  in the distal left internal carotid artery for proximal flow arrest. The proximal portion of the retrieval device was captured into the microcatheter. There on after whilst aspirating with a 60 mL syringe at the hub of the [facility] guide catheter, the combination of the retrieval device and the microcatheter were gently retrieved and removed. The aspirate demonstrated prominent clot measuring approximately 2 mm x 4 mm. Aspiration was continued as the balloon was deflated in the left internal carotid artery. The FlowGate guide catheter was gently retrieved more proximally after deflation of the balloon. A control arteriogram performed through the 8 [place] FlowGate guide catheter demonstrated complete angiographic revascularization of the left occluded left middle cerebral artery with patency of the left posterior cerebral and left anterior cerebral arteries. Focal segmental spasm was noted of the left middle cerebral artery involving the inferior division in the distal M1 region. This responded to 4 aliquots of 25 mics of nitroglycerin intra-arterially. A final control arteriogram performed demonstrated significant improved caliber of the previously noted vasospasm of the left middle cerebral artery, and also the  left internal carotid artery mid cervical segment. Also noted of the final to control arteriograms a focal area of hyper density in the left centrum semiovale region. No evidence of angiographic extravasation or mass effect was seen. Patient's hemodynamic status, blood pressure and heart rate remained stable throughout the procedure. The 8 [place] FlowGate guide catheter and the 8 [place] neurovascular sheath were then retrieved into the abdominal aorta and exchanged over a J-tip guidewire for an 8 French Pinnacle sheath which in turn was successfully removed with the application of a closure device. The right groin showed no evidence of a hematoma or hemorrhage. The distal pulses in both feet remained Dopplerable unchanged from prior to the procedure. IMPRESSION: Status post endovascular complete revascularization of occluded left middle cerebral artery with 1 pass with the Solitaire FR 4 mm x 40 mm retrieval device, with achievement of a TICI 3 reperfusion. Groin puncture to initial reperfusion TICI2b 32 minutes. Groin puncture to TICI reperfusion 38 minutes PLAN: The patient was transferred to the CT scanner for postprocedural CT scan of the brain. Electronically Signed   By: Luanne Bras M.D.   On: 11/29/2016 14:42    Assessment/Plan: Diagnosis: Right hemiparesis. Aphasia and dysphagia secondary to left MCA infarct 1. Does the need for close, 24 hr/day medical supervision in concert with the patient's rehab needs make it unreasonable for this patient to be served in a less intensive setting? Yes 2. Co-Morbidities requiring supervision/potential complications: Atrial fibrillation, status post respiratory failure 3. Due to bladder management, bowel management, safety, skin/wound care, disease management, medication administration, pain management and patient education, does the patient require 24 hr/day rehab nursing? Yes 4. Does the patient require coordinated care of a physician, rehab nurse, PT (1-2  hrs/day, 5 days/week), OT (1-2 hrs/day, 5 days/week) and SLP (.5-1 hrs/day, 5 days/week) to address physical and functional deficits in the context of the above medical diagnosis(es)? Yes Addressing deficits in the following areas: balance, endurance, locomotion, strength, transferring, bowel/bladder control, bathing, dressing, feeding, grooming, toileting, cognition, speech, language, swallowing and psychosocial support 5. Can the patient actively participate in an intensive therapy program of at least 3 hrs of therapy per day at least 5 days per week? Yes 6. The potential for patient to make measurable gains while on inpatient rehab is good 7. Anticipated functional outcomes upon discharge from inpatient rehab are supervision and min assist  with PT, supervision and min assist  with OT, supervision and min assist with SLP. 8. Estimated rehab length of stay to reach the above functional goals is: 19-22d 9. Anticipated D/C setting: Home 10. Anticipated post D/C treatments: [place] therapy 11. Overall Rehab/Functional Prognosis: excellent  RECOMMENDATIONS: This patient's condition is appropriate for continued rehabilitative care in the following setting: CIR Patient has agreed to participate in recommended program. N/A Note that insurance prior authorization may be required for reimbursement for recommended care.  Comment:   Charlett Blake M.D. [facility] FAAPM&R (Sports Med, Neuromuscular Med) Diplomate Am Board of Electrodiagnostic Med  Flora Lipps 12/01/2016

## 2016-12-01 NOTE — Care Management Note (Signed)
Case Management Note  Patient Details  Name: Valerie Mcclure MRN: [id number] Date of Birth: [birth year]  Subjective/Objective:  Pt admitted on 11/29/16 s/p LT MCA infarct with basal ganglia hematoma.  PTA, pt was independent and from home; lives with spouse.                     Action/Plan: PT/OT recommending CIR; rehab consult pending.  Will follow for discharge planning as pt progresses.    Expected Discharge Date:                  Expected Discharge Plan:  [place]  In-House Referral:     Discharge planning Services  CM Consult  Post Acute Care Choice:    Choice offered to:     DME Arranged:    DME Agency:     HH Arranged:    [place] Agency:     Status of Service:  In process, will continue to follow  If discussed at Long Length of Stay Meetings, dates discussed:    Additional Comments:  Reinaldo Raddle, RN, BSN  Trauma/Neuro ICU Case Manager [phone]

## 2016-12-01 NOTE — Progress Notes (Signed)
PULMONARY / CRITICAL CARE MEDICINE   Name: Valerie Mcclure MRN: [id number] DOB: Dec 27, 1940    ADMISSION DATE:  11/29/2016 CONSULTATION DATE:  11/30/2016  REFERRING MD:  [name], neurology  CHIEF COMPLAINT:  Respiratory failure post CVA  BRIEF SUMMARY:   76 year old female who woke up aphasic and with right sided hemiplegia.  Found to have a hyperdense left MCA and hypodense left temporal area.  Patient was taken to IR for clot retrieval.  Patient was sedated for procedure.  Patient is now sedated and intubated, unable to obtain much history.  PCCM consulted for vent management.   SUBJECTIVE: RN reports no acute events, residual RUE weakness.  SLP pending evaluation for swallowing.    VITAL SIGNS: BP (!) 139/54 (BP Location: Right Arm)   Pulse 80   Temp 98.2 F (36.8 C) (Oral)   Resp (!) 21   Ht 5\' 3"  (1.6 m)   Wt 175 lb 4.3 oz (79.5 kg)   SpO2 100%   BMI 31.05 kg/m   HEMODYNAMICS:    VENTILATOR SETTINGS: Vent Mode: PSV;CPAP FiO2 (%):  [40 %] 40 % PEEP:  [5 cmH20] 5 cmH20 Pressure Support:  [10 cmH20] 10 cmH20  INTAKE / OUTPUT: I/O last 3 completed shifts: In: 3761.7 [I.V.:3761.7] Out: 2300 [Urine:2300]  PHYSICAL EXAMINATION: General: well developed elderly female in NAD HEENT: MM pink/moist, good dentition  Neuro: awake, alert, expressive aphasia but occasionally gets out correct answers / appropriate  CV: s1s2 rrr, no m/r/g PULM: even/non-labored, lungs bilaterally clear  WC:HENI, non-tender, bsx4 active  Extremities: warm/dry, no edema  Skin: no rashes or lesions  LABS:  BMET  Recent Labs Lab 11/29/16 0834 11/29/16 0842 11/30/16 0329 12/01/16 0229  NA 137 139 141 144  K 3.7 3.7 3.5 3.4*  CL 102 100* 106 114*  CO2 27  --  22 22  BUN 24* 27* 19 17  CREATININE 1.49* 1.40* 1.26* 1.10*  GLUCOSE 141* 139* 150* 126*    Electrolytes  Recent Labs Lab 11/29/16 0834 11/30/16 0329 12/01/16 0229  CALCIUM 8.7* 8.2* 8.2*  MG  --   --  2.1  PHOS   --   --  1.9*    CBC  Recent Labs Lab 11/29/16 0834 11/29/16 0842 11/30/16 0329 12/01/16 0229  WBC 6.3  --  20.7* 17.3*  HGB 11.7* 11.2* 12.6 10.2*  HCT 35.2* 33.0* 37.6 31.8*  PLT 204  --  281 219    Coag's  Recent Labs Lab 11/29/16 0834  APTT 29  INR 1.01    Sepsis Markers No results for input(s): LATICACIDVEN, PROCALCITON, O2SATVEN in the last 168 hours.  ABG  Recent Labs Lab 11/29/16 1700 12/01/16 0351  PHART 7.355 7.437  PCO2ART 41.3 32.6  PO2ART 131* 147*    Liver Enzymes  Recent Labs Lab 11/29/16 0834  AST 29  ALT 17  ALKPHOS 56  BILITOT 0.3  ALBUMIN 3.4*    Cardiac Enzymes No results for input(s): TROPONINI, PROBNP in the last 168 hours.  Glucose  Recent Labs Lab 11/29/16 0845  GLUCAP 121*    Imaging Mr Brain Wo Contrast  Result Date: 11/30/2016 CLINICAL DATA:  76 year old female who awoke with aphasia and right facial droop. Left M1 occlusion status post neuro endovascular revascularization. Subsequent large left hemisphere hemorrhage. EXAM: MRI HEAD WITHOUT CONTRAST TECHNIQUE: Multiplanar, multiecho pulse sequences of the brain and surrounding structures were obtained without intravenous contrast. COMPARISON:  Post endovascular Head CT 11/29/2016, and earlier. FINDINGS: Brain: Cortical and subcortical  white matter restricted diffusion along the anterior left temporal tip and tracking posteriorly along the left superior and middle temporal gyri (Series 5, image 19). Superimposed intracranial hemorrhage centered at the left basal ganglia with T2 and T1 heterogeneous blood products encompassing 55 x 30 x 31 mm (AP by transverse by CC) for an estimated blood volume of 26 mL. Associated diffusion susceptibility in the area of the hemorrhage, but there also seems to be some marginal restricted diffusion. Small volume of intraventricular hemorrhage layering in the occipital horns. Effaced left lateral ventricle. No ventriculomegaly. Edema  surrounding the area of hemorrhage, and cytotoxic edema in the left temporal lobe. Rightward midline shift of 7 mm. Patent basilar cisterns. There are occasional small foci of restricted diffusion in the left PCA territory including the dorsal left thalamus and anterior left occipital lobe. No posterior fossa or contralateral right hemisphere diffusion restriction. Patchy nonspecific T2 hyperintensity in the pons. No cortical encephalomalacia. No chronic cerebral blood products. Negative pituitary and cervicomedullary junction. Vascular: Major intracranial vascular flow voids are preserved. Skull and upper cervical spine: Negative. Sinuses/Orbits: Normal orbits soft tissues. Visualized paranasal sinuses and mastoids are stable and well pneumatized. Other: Layering fluid in the pharynx.  Negative scalp soft tissues. IMPRESSION: 1. Left MCA infarct with the most confluent diffusion restriction in the left temporal lobe. Superimposed 5.5 cm intra-axial hemorrhage centered at the left basal ganglia with intra-axial blood volume estimated 26 mL. Associated left hemisphere edema. 2. Rightward midline shift of 7 mm.  Basilar cisterns remain patent. 3. Effaced left lateral ventricle and trace intraventricular hemorrhage without ventriculomegaly. 4. Occasional small foci of restricted diffusion also at the left PCA territory. Electronically Signed   By: Genevie Ann M.D.   On: 11/30/2016 13:59   Dg Chest Port 1 View  Result Date: 12/01/2016 CLINICAL DATA:  Stroke, history of hypertension, extubation of the trachea and esophagus. EXAM: PORTABLE CHEST 1 VIEW COMPARISON:  Portable chest x-ray of Nov 29, 2016. FINDINGS: The lungs are adequately inflated. There is no focal infiltrate. The retrocardiac lung markings on the left remain mildly prominent. There is no significant pleural effusion and there is no pneumothorax. The heart is top-normal in size. The pulmonary vascularity is normal. There is calcification in the wall of the  thoracic aorta. IMPRESSION: Good inflation of both lungs since extubation. Minimal subsegmental atelectasis at the left lung base is suspected. No CHF. Thoracic aortic atherosclerosis. Electronically Signed   By: David  Martinique M.D.   On: 12/01/2016 07:27     STUDIES:  Head CT 5/13 >> Large area of hemorrhage and extravasated contrast in the left basal ganglia, blood volume 39 mL. Hemorrhagic transformation of left temporal infarct. Mild amount of subdural hematoma along the tentorium bilaterally.  6 mm midline shift to the right. MRI 5/14 >> L MCA infarct with most confluent diffusion restriction in the L temporal lobe, midline 78mm shift toward R  CULTURES:   ANTIBIOTICS:   SIGNIFICANT EVENTS: 5/13  Admit with CVA, intubated for IR procedure 5/14  Extubated   LINES/TUBES: ETT 5/13 >> 5/14  DISCUSSION: 76 year old female with PMH above presenting with a CVA and was intubated for IR procedure.  PCCM consulted for vent management.   ASSESSMENT / PLAN:  NEUROLOGIC A:   CVA Depression  Sedated and intubated - resolved  P:   Minimize sedating medications Push PT efforts  Consider CIR consultation  Control BP as below Hold home prozac, MS contin for now  PULMONARY A: Acute  respiratory failure post CVA.  Extubated 5/14.   P:   Pulmonary hygiene - IS, mobilize O2 as needed for sats >92% Intermittent CXR Aspiration precautions  CARDIOVASCULAR A:  Hypertension. P:  Tele monitoring  PRN hydralazine for SBP 120-140 Hold home lasix, lopressor, procardia, zocar, diovan for now   RENAL A:   Hypokalemia AKI - resolved    P:   Trend BMP / urinary output Replace electrolytes as indicated, KCL 5/15  Avoid nephrotoxic agents, ensure adequate renal perfusion Continue NS @ 100 ml/hr   GASTROINTESTINAL A:   At Risk Aspiration  P:   NPO SLP evaluation  Aspiration Precautions  HEMATOLOGIC A:   Leukocytosis likely stress response P:  Trend CBC Transfuse per ICU  guidelines   INFECTIOUS A:   No active issues P:   Monitor fever curve / WBC  ENDOCRINE A:   Hypothyroidism - TSH 1.076 on admit P:   Continue synthroid    FAMILY  - Updates: Daughter updated at bedside on plan of care  - Inter-disciplinary family meet or Palliative Care meeting due by:  day 7   PCCM will be available PRN.  Please call back if new needs arise.   Noe Gens, NP-C [facility] Pgr: [phone] or if no answer [phone] 12/01/2016, 9:24 AM  Attending Note:  76 year old female s/p CVA and IR intervention who was extubated on 5/14 and doing well.  Remains somewhat aphasic on exam with clear lungs.  I reviewed CXR myself, no acute disease noted.  Discussed with PCCM-NP.  Respiratory failure:  - Monitor closely for airway protection  - SLP  - Titrate O2 for sat of 88-92%.  CVA:  - Neurology following  - BP control as above  - Rehab  - May consider CIR.  Essential hypertension:  - PRN hydralazine  - Pending trend may need to restart home medications.  Hypokalemia:  - Replace K  - BMET in AM  - Replace other electrolytes as indicated.  PCCM will sign off, please call back if needed.  Patient seen and examined, agree with above note.  I dictated the care and orders written for this patient under my direction.  Rush Farmer, [place]

## 2016-12-01 NOTE — Evaluation (Signed)
Speech Language Pathology Evaluation Patient Details Name: Valerie Mcclure MRN: [id number] DOB: 11-12-1940 Today's Date: 12/01/2016 Time: 0998-3382 SLP Time Calculation (min) (ACUTE ONLY): 12 min  Problem List:  Patient Active Problem List   Diagnosis Date Noted  . Stroke (cerebrum) ([place]) 11/29/2016  . Mild aortic regurgitation 08/20/2016  . Bilateral edema of lower extremity 04/07/2016  . Pancreatic duct dilated 01/09/2016  . CKD (chronic kidney disease) 12/25/2015  . Mild tricuspid regurgitation 12/25/2015  . Mild mitral regurgitation 12/25/2015  . Prediabetes 06/10/2015  . Edema 06/04/2015  . Abdominal pain, chronic, right upper quadrant 06/04/2015  . Postmenopausal disorder 04/03/2014  . Chronic low back pain   . PAIN IN THORACIC SPINE 04/18/2010  . Coronary atherosclerosis 01/24/2009  . DEGENERATIVE JOINT DISEASE 01/23/2009  . ARTHRITIS, LEFT KNEE 12/13/2008  . HERNIATED LUMBAR DISC 12/13/2008  . Hypothyroidism 11/21/2007  . Hyperlipidemia 11/09/2007  . ANEMIA 11/09/2007  . Essential hypertension 11/09/2007  . Osteopenia 11/09/2007   Past Medical History:  Past Medical History:  Diagnosis Date  . Blood transfusion without reported diagnosis   . Chronic low back pain   . Hyperlipidemia   . Osteopenia   . Unspecified essential hypertension   . Unspecified hypothyroidism    Past Surgical History:  Past Surgical History:  Procedure Laterality Date  . ABDOMINAL HYSTERECTOMY  1970  . CHOLECYSTECTOMY  07/2009   Dr. Rise Patience  . COLONOSCOPY  2003  . FLEXIBLE SIGMOIDOSCOPY  2010  . HAND SURGERY    . IR ANGIO INTRA EXTRACRAN SEL COM CAROTID INNOMINATE UNI L MOD SED  11/29/2016  . IR ANGIO VERTEBRAL SEL SUBCLAVIAN INNOMINATE UNI R MOD SED  11/29/2016  . IR PERCUTANEOUS ART THROMBECTOMY/INFUSION INTRACRANIAL INC DIAG ANGIO  11/29/2016  . LUMBAR LAMINECTOMY  11/2008   Done by Dr. Patrice Paradise  . RADIOLOGY WITH ANESTHESIA N/A 11/29/2016   Procedure: RADIOLOGY WITH ANESTHESIA;   Surgeon: Radiologist, Medication, MD;  Location: [place];  Service: Radiology;  Laterality: N/A;  . THYROIDECTOMY     HPI:  Ptis a 76 y.o.femalewith history of chronic low back pain, HTN, HLD, hypothyroidism presenting with aphasia and right hemiplegia on awakening. Shedidnot receive IV t-PA due to delay in arrival but was taken to interventional neuroradiology was she received  TICI3 revascularization of the occluded left MCA with mechanical thrombectomy. Patient with post IR hemorrhage. MRI showed moderate sized L MCA infarct with basal ganglia hematoma with slight intraventricular extension and cytotoxic edema with slight 7 mm of midline shift. She was intubated 5/13-5/14.   Assessment / Plan / Recommendation Clinical Impression  Pt has a moderate dysarthria that impacts her intelligibility of speech at the phrase level. She has an expressive > receptive mixed aphasia, needing Min cues for completion of simple commands. She is 90% accurate with simple yes/no questions but only about 70% accurate as the complexity increases. SLP provided Mod cues for confrontational naming trials, with verbal output marked by phonemic paraphasias, word substitutions, and anomia. Pt will benefit from SLP f/u to maximize functional communication as pt was living independently PTA.    SLP Assessment  SLP Recommendation/Assessment: Patient needs continued Speech Lanaguage Pathology Services SLP Visit Diagnosis: Aphasia (R47.01);Dysarthria and anarthria (R47.1)    Follow Up Recommendations  Inpatient Rehab    Frequency and Duration min 2x/week  2 weeks      SLP Evaluation Cognition  Overall Cognitive Status: Difficult to assess (aphasia) Arousal/Alertness: Awake/alert Orientation Level: Oriented to person;Disoriented to place;Disoriented to situation Attention: Sustained Sustained Attention: Appears  intact Safety/Judgment: Impaired       Comprehension  Auditory Comprehension Overall Auditory  Comprehension: Impaired Yes/No Questions: Impaired Basic Biographical Questions: 76-100% accurate Basic Immediate Environment Questions: 75-100% accurate Complex Questions: 50-74% accurate Commands: Impaired One Step Basic Commands: 75-100% accurate Conversation: Simple    Expression Expression Primary Mode of Expression: Verbal Verbal Expression Overall Verbal Expression: Impaired Initiation: No impairment Automatic Speech: Name;Social Response Level of Generative/Spontaneous Verbalization: Phrase;Sentence Repetition: Impaired Level of Impairment: Sentence level Naming: Impairment Confrontation: Impaired Verbal Errors: Phonemic paraphasias;Other (comment) (intermittently aware; word substitutions) Effective Techniques: Sentence completion;Phonemic cues Non-Verbal Means of Communication: Not applicable   Oral / Motor  Oral Motor/Sensory Function Overall Oral Motor/Sensory Function: Moderate impairment Facial ROM: Reduced right;Suspected CN VII (facial) dysfunction Facial Symmetry: Abnormal symmetry right;Suspected CN VII (facial) dysfunction Facial Strength: Reduced right;Suspected CN VII (facial) dysfunction Lingual ROM: Reduced left;Suspected CN XII (hypoglossal) dysfunction Lingual Symmetry: Abnormal symmetry left;Suspected CN XII (hypoglossal) dysfunction Lingual Strength: Reduced;Suspected CN XII (hypoglossal) dysfunction Velum: Within Functional Limits Mandible: Within Functional Limits Motor Speech Overall Motor Speech: Impaired Respiration: Within functional limits Phonation: Normal Resonance: Within functional limits Articulation: Impaired Level of Impairment: Phrase Intelligibility: Intelligibility reduced Phrase: 50-74% accurate   GO                    [name] 12/01/2016, 10:07 AM  [name], M.A. CCC-SLP [phone]

## 2016-12-01 NOTE — Procedures (Signed)
Objective Swallowing Evaluation: Type of Study: FEES-Fiberoptic Endoscopic Evaluation of Swallow  Patient Details  Name: Valerie Mcclure MRN: [id number] Date of Birth: [birth year]  Today's Date: 12/01/2016 Time: SLP Start Time (ACUTE ONLY): 1422-SLP Stop Time (ACUTE ONLY): 1452 SLP Time Calculation (min) (ACUTE ONLY): 30 min  Past Medical History:  Past Medical History:  Diagnosis Date  . Blood transfusion without reported diagnosis   . Chronic low back pain   . Hyperlipidemia   . Osteopenia   . Unspecified essential hypertension   . Unspecified hypothyroidism    Past Surgical History:  Past Surgical History:  Procedure Laterality Date  . ABDOMINAL HYSTERECTOMY  1970  . CHOLECYSTECTOMY  07/2009   Dr. Rise Patience  . COLONOSCOPY  2003  . FLEXIBLE SIGMOIDOSCOPY  2010  . HAND SURGERY    . IR ANGIO INTRA EXTRACRAN SEL COM CAROTID INNOMINATE UNI L MOD SED  11/29/2016  . IR ANGIO VERTEBRAL SEL SUBCLAVIAN INNOMINATE UNI R MOD SED  11/29/2016  . IR PERCUTANEOUS ART THROMBECTOMY/INFUSION INTRACRANIAL INC DIAG ANGIO  11/29/2016  . LUMBAR LAMINECTOMY  11/2008   Done by Dr. Patrice Paradise  . RADIOLOGY WITH ANESTHESIA N/A 11/29/2016   Procedure: RADIOLOGY WITH ANESTHESIA;  Surgeon: Radiologist, Medication, MD;  Location: [place];  Service: Radiology;  Laterality: N/A;  . THYROIDECTOMY     HPI: Ptis a 76 y.o.femalewith history of chronic low back pain, HTN, HLD, hypothyroidism presenting with aphasia and right hemiplegia on awakening. Shedidnot receive IV t-PA due to delay in arrival but was taken to interventional neuroradiology was she received  TICI3 revascularization of the occluded left MCA with mechanical thrombectomy. Patient with post IR hemorrhage. MRI showed moderate sized L MCA infarct with basal ganglia hematoma with slight intraventricular extension and cytotoxic edema with slight 7 mm of midline shift. She was intubated 5/13-5/14.  Subjective: pt alert, aphasic and dysarthric   Assessment  / Plan / Recommendation  CHL IP CLINICAL IMPRESSIONS 12/01/2016  Clinical Impression Pt has a moderate oropharyngeal dysphagia with sensorimotor components. She has decreased oral containement with both anterior spillage of thin liquids as well as premature spillage into they pharynx with all liquids tested. This allows for deep, silent penetration of thin, nectar-thick, and large boluses of honey thick liquids. Mod cues were provided for more effortful coughs to clear penetrates. While a chin tuck was not effective, she did have improved airway protection by reducing honey-thick boluses to spoonfuls. Moderate residue remains throughout the pharynx post-swallow, although most significantly in the vallecula with thicker textures. Alternating between solids/liquids and cued second swallows helped to reduce the amount of residuals. Recommend Dys 1 diet and honey thick liquids by spoon with use of the aforementioned strategies to increase safety and use of pharyngeal musculature to facilitate strength rebuilding.   SLP Visit Diagnosis Dysphagia, oropharyngeal phase (R13.12)  Attention and concentration deficit following --  Frontal lobe and executive function deficit following --  Impact on safety and function Mild aspiration risk;Moderate aspiration risk      CHL IP TREATMENT RECOMMENDATION 12/01/2016  Treatment Recommendations Therapy as outlined in treatment plan below     Prognosis 12/01/2016  Prognosis for Safe Diet Advancement Good  Barriers to Reach Goals Language deficits;Severity of deficits  Barriers/Prognosis Comment --    CHL IP DIET RECOMMENDATION 12/01/2016  SLP Diet Recommendations Dysphagia 1 (Puree) solids;Honey thick liquids  Liquid Administration via Spoon  Medication Administration Crushed with puree  Compensations Slow rate;Small sips/bites;Monitor for anterior loss;Multiple dry swallows after each bite/sip;Follow solids with  liquid  Postural Changes Remain semi-upright after  after feeds/meals (Comment);Seated upright at 90 degrees      CHL IP OTHER RECOMMENDATIONS 12/01/2016  Recommended Consults --  Oral Care Recommendations Oral care BID  Other Recommendations Order thickener from pharmacy;Prohibited food (jello, ice cream, thin soups);Remove water pitcher      CHL IP FOLLOW UP RECOMMENDATIONS 12/01/2016  Follow up Recommendations Inpatient Rehab      CHL IP FREQUENCY AND DURATION 12/01/2016  Speech Therapy Frequency (ACUTE ONLY) min 2x/week  Treatment Duration 2 weeks           CHL IP ORAL PHASE 12/01/2016  Oral Phase Impaired  Oral - Pudding Teaspoon --  Oral - Pudding Cup --  Oral - Honey Teaspoon Delayed oral transit;Decreased bolus cohesion;Premature spillage  Oral - Honey Cup Delayed oral transit;Decreased bolus cohesion;Premature spillage  Oral - Nectar Teaspoon Delayed oral transit;Decreased bolus cohesion;Premature spillage  Oral - Nectar Cup --  Oral - Nectar Straw --  Oral - Thin Teaspoon Delayed oral transit;Decreased bolus cohesion;Premature spillage;Right anterior bolus loss;Other (Comment)  Oral - Thin Cup --  Oral - Thin Straw --  Oral - Puree Delayed oral transit;Decreased bolus cohesion  Oral - Mech Soft --  Oral - Regular --  Oral - Multi-Consistency --  Oral - Pill --  Oral Phase - Comment --    CHL IP PHARYNGEAL PHASE 12/01/2016  Pharyngeal Phase Impaired  Pharyngeal- Pudding Teaspoon --  Pharyngeal --  Pharyngeal- Pudding Cup --  Pharyngeal --  Pharyngeal- Honey Teaspoon Reduced pharyngeal peristalsis;Reduced epiglottic inversion;Reduced anterior laryngeal mobility;Reduced laryngeal elevation;Reduced airway/laryngeal closure;Reduced tongue base retraction;Pharyngeal residue - valleculae;Pharyngeal residue - pyriform;Lateral channel residue  Pharyngeal --  Pharyngeal- Honey Cup Reduced pharyngeal peristalsis;Reduced epiglottic inversion;Reduced anterior laryngeal mobility;Reduced laryngeal elevation;Reduced  airway/laryngeal closure;Reduced tongue base retraction;Pharyngeal residue - valleculae;Pharyngeal residue - pyriform;Lateral channel residue;Penetration/Aspiration before swallow  Pharyngeal Material enters airway, remains ABOVE vocal cords and not ejected out  Pharyngeal- Nectar Teaspoon Reduced pharyngeal peristalsis;Reduced epiglottic inversion;Reduced anterior laryngeal mobility;Reduced laryngeal elevation;Reduced airway/laryngeal closure;Reduced tongue base retraction;Pharyngeal residue - valleculae;Pharyngeal residue - pyriform;Lateral channel residue;Penetration/Aspiration before swallow;Compensatory strategies attempted (with notebox);Pharyngeal residue - cp segment  Pharyngeal Material enters airway, remains ABOVE vocal cords and not ejected out  Pharyngeal- Nectar Cup --  Pharyngeal --  Pharyngeal- Nectar Straw --  Pharyngeal --  Pharyngeal- Thin Teaspoon Reduced pharyngeal peristalsis;Reduced epiglottic inversion;Reduced anterior laryngeal mobility;Reduced laryngeal elevation;Reduced airway/laryngeal closure;Reduced tongue base retraction;Pharyngeal residue - valleculae;Pharyngeal residue - pyriform;Lateral channel residue;Other (Comment);Penetration/Aspiration before swallow;Pharyngeal residue - cp segment  Pharyngeal Material enters airway, CONTACTS cords and not ejected out  Pharyngeal- Thin Cup --  Pharyngeal --  Pharyngeal- Thin Straw --  Pharyngeal --  Pharyngeal- Puree Reduced pharyngeal peristalsis;Reduced epiglottic inversion;Reduced anterior laryngeal mobility;Reduced laryngeal elevation;Reduced airway/laryngeal closure;Reduced tongue base retraction;Pharyngeal residue - valleculae;Pharyngeal residue - pyriform;Lateral channel residue;Pharyngeal residue - posterior pharnyx  Pharyngeal --  Pharyngeal- Mechanical Soft --  Pharyngeal --  Pharyngeal- Regular --  Pharyngeal --  Pharyngeal- Multi-consistency --  Pharyngeal --  Pharyngeal- Pill --  Pharyngeal --  Pharyngeal  Comment --     CHL IP CERVICAL ESOPHAGEAL PHASE 12/01/2016  Cervical Esophageal Phase (No Data)  Pudding Teaspoon --  Pudding Cup --  Honey Teaspoon --  Honey Cup --  Nectar Teaspoon --  Nectar Cup --  Nectar Straw --  Thin Teaspoon --  Thin Cup --  Thin Straw --  Puree --  Mechanical Soft --  Regular --  Multi-consistency --  Pill --  Cervical  Esophageal Comment --    No flowsheet data found.  [name] 12/01/2016, 3:43 PM   [name], M.A. CCC-SLP [phone]

## 2016-12-01 NOTE — Consult Note (Signed)
Cardiology Consult    Patient ID: Valerie Mcclure MRN: [id number], DOB/AGE: Dec 26, 1940   Admit date: 11/29/2016 Date of Consult: 12/01/2016  Primary Physician: [name], MD Primary Cardiologist: new - Dr. Acie Fredrickson Requesting Provider: Dr. [name]  Reason for Consult: new onset atrial fibrillation  Patient Profile    [name] is a 76 yo female with a PMH significant for HTN, lower extremity edema, hypothyroidism, HLD, pre-diabetes, and CKD with baseline sCr 1.4-1.6. She presented to [facility] with symptoms of stroke. Neurology was consulted and she underwent thrombectomy to L MCA with subsequent brain hemorrhage with shift.   Valerie Mcclure is a 76 y.o. female who is being seen today for the evaluation of Afib at the request of Dr. [name].   Past Medical History   Past Medical History:  Diagnosis Date  . Blood transfusion without reported diagnosis   . Chronic low back pain   . Hyperlipidemia   . Osteopenia   . Unspecified essential hypertension   . Unspecified hypothyroidism     Past Surgical History:  Procedure Laterality Date  . ABDOMINAL HYSTERECTOMY  1970  . CHOLECYSTECTOMY  07/2009   Dr. Rise Patience  . COLONOSCOPY  2003  . FLEXIBLE SIGMOIDOSCOPY  2010  . HAND SURGERY    . IR ANGIO INTRA EXTRACRAN SEL COM CAROTID INNOMINATE UNI L MOD SED  11/29/2016  . IR ANGIO VERTEBRAL SEL SUBCLAVIAN INNOMINATE UNI R MOD SED  11/29/2016  . IR PERCUTANEOUS ART THROMBECTOMY/INFUSION INTRACRANIAL INC DIAG ANGIO  11/29/2016  . LUMBAR LAMINECTOMY  11/2008   Done by Dr. Patrice Paradise  . RADIOLOGY WITH ANESTHESIA N/A 11/29/2016   Procedure: RADIOLOGY WITH ANESTHESIA;  Surgeon: Radiologist, Medication, MD;  Location: [facility];  Service: Radiology;  Laterality: N/A;  . THYROIDECTOMY       Allergies  Allergies  Allergen Reactions  . Shrimp [Shellfish Allergy]     Break outs and swelling   . Tandem Plus [Fefum-Fepo-Fa-B Cmp-C-Zn-Mn-Cu] Nausea And Vomiting  . Ivp Dye [Iodinated Diagnostic  Agents] Rash    itching  . Penicillins Rash    itching    History of Present Illness    Ms Dlouhy was found by a family member after falling out of bed. She was found to have aphasia and facial droop. She was brought to [facility] for further evaluation and was found to have a left MCA infarct. She underwent mechanical thrombectomy and revascularization; however, post IR she was found to have a large left basal ganglia hemorrhage with right shift.   Inpatient Medications    .  stroke: mapping our early stages of recovery book   Does not apply Once  . chlorhexidine gluconate (MEDLINE KIT)  15 mL Mouth Rinse BID  . levothyroxine  37.5 mcg Intravenous Daily  . mouth rinse  15 mL Mouth Rinse 10 times per day  . pantoprazole (PROTONIX) IV  40 mg Intravenous QHS  . [START ON 12/02/2016] pneumococcal 23 valent vaccine  0.5 mL Intramuscular Tomorrow-1000     Outpatient Medications    Prior to Admission medications   Medication Sig Start Date End Date Taking? Authorizing Provider  estradiol (ESTRACE) 0.5 MG tablet TAKE 1 TABLET(0.5 MG) BY MOUTH DAILY 05/20/16  Yes Burns, Claudina Lick, MD  FLUoxetine (PROZAC) 20 MG capsule Take 1 capsule (20 mg total) by mouth daily. Patient taking differently: Take 40 mg by mouth daily.  04/27/14  Yes Rowe Clack, MD  furosemide (LASIX) 20 MG tablet TAKE 1 TABLET BY MOUTH DAILY  AS NEEDED 07/29/16  Yes Burns, Claudina Lick, MD  levothyroxine (SYNTHROID, LEVOTHROID) 75 MCG tablet Take 75 mcg by mouth daily before breakfast.   Yes [provider]  metoprolol tartrate (LOPRESSOR) 25 MG tablet TAKE 1 TABLET(25 MG) BY MOUTH TWICE DAILY 05/20/16  Yes Burns, Claudina Lick, MD  morphine (MS CONTIN) 15 MG 12 hr tablet Take 15 mg by mouth every 12 (twelve) hours.  04/01/16  Yes [provider]  NIFEdipine (PROCARDIA-XL/ADALAT-CC/NIFEDICAL-XL) 30 MG 24 hr tablet Take 30 mg by mouth daily.   Yes [provider]  nortriptyline (PAMELOR) 10 MG capsule TAKE 2 CAPSULES  BY MOUTH AT BEDTIME 06/08/16  Yes Burns, Claudina Lick, MD  simvastatin (ZOCOR) 10 MG tablet TAKE 1 TABLET(10 MG) BY MOUTH DAILY 10/14/16  Yes Burns, Claudina Lick, MD  valsartan (DIOVAN) 160 MG tablet Take 1 tablet (160 mg total) by mouth daily. 12/25/15  Yes Burns, Claudina Lick, MD  vitamin B-12 (CYANOCOBALAMIN) 100 MCG tablet Take 100 mcg by mouth daily.   Yes [provider]  Calcium Carbonate-Vit D-Min (CALCIUM 1200 PO) Take by mouth daily.      [provider]  Cholecalciferol (VITAMIN D3) 1000 UNITS CAPS Take by mouth daily.      [provider]  levothyroxine (SYNTHROID, LEVOTHROID) 88 MCG tablet Take 1 tablet (88 mcg total) by mouth daily. 12/30/15   [name], MD     Family History     Family History  Problem Relation Age of Onset  . Heart disease Father 27       MI age 36s  . Lung cancer Brother 51  . Colon cancer Neg Hx   . Esophageal cancer Neg Hx   . Rectal cancer Neg Hx   . Stomach cancer Neg Hx     Social History    Social History   Social History  . Marital status: Married    Spouse name: N/A  . Number of children: N/A  . Years of education: N/A   Occupational History  . Not on file.   Social History Main Topics  . Smoking status: Never Smoker  . Smokeless tobacco: Never Used  . Alcohol use No  . Drug use: No  . Sexual activity: Not on file   Other Topics Concern  . Not on file   Social History Narrative   Married     Review of Systems    General:  No chills, fever, night sweats or weight changes.  Cardiovascular:  No chest pain, dyspnea on exertion, edema, orthopnea, palpitations, paroxysmal nocturnal dyspnea. Dermatological: No rash, lesions/masses Respiratory: No cough, dyspnea Urologic: No hematuria, dysuria Abdominal:   No nausea, vomiting, diarrhea, bright red blood per rectum, melena, or hematemesis Neurologic:  No visual changes, changes in mental status, right upper extremity weakness All other systems reviewed and are  otherwise negative except as noted above.  Physical Exam    Blood pressure 131/64, pulse 81, temperature 98.1 F (36.7 C), temperature source Oral, resp. rate 20, height '5\' 3"'$  (1.6 m), weight 175 lb 4.3 oz (79.5 kg), SpO2 98 %.  General: Pleasant, NAD, can answer yes/no questions, speech slurred Psych: Normal affect. Neuro: Alert and oriented X 3. Moves all extremities spontaneously. HEENT: Normal  Neck: Supple without bruits or JVD. Lungs:  Resp regular and unlabored, CTA. Heart: RRR no s3, s4, or murmurs. Abdomen: Soft, non-tender, non-distended, BS + x 4.  Extremities: No clubbing, cyanosis or edema. DP/PT/Radials 2+ and equal bilaterally. Can't move her  right upper extremity  Labs    Troponin (Point of Care Test)  Recent Labs  11/29/16 0840  TROPIPOC 1.86*   No results for input(s): CKTOTAL, CKMB, TROPONINI in the last 72 hours. Lab Results  Component Value Date   WBC 17.3 (H) 12/01/2016   HGB 10.2 (L) 12/01/2016   HCT 31.8 (L) 12/01/2016   MCV 92.4 12/01/2016   PLT 219 12/01/2016    Recent Labs Lab 11/29/16 0834  12/01/16 0229  NA 137  < > 144  K 3.7  < > 3.4*  CL 102  < > 114*  CO2 27  < > 22  BUN 24*  < > 17  CREATININE 1.49*  < > 1.10*  CALCIUM 8.7*  < > 8.2*  PROT 6.5  --   --   BILITOT 0.3  --   --   ALKPHOS 56  --   --   ALT 17  --   --   AST 29  --   --   GLUCOSE 141*  < > 126*  < > = values in this interval not displayed. Lab Results  Component Value Date   CHOL 162 11/30/2016   HDL 42 11/30/2016   LDLCALC 85 11/30/2016   TRIG 175 (H) 11/30/2016   No results found for: [facility]   Radiology Studies    Ct Angio Head W Or Wo Contrast  Result Date: 11/29/2016 CLINICAL DATA:  Right facial droop and slurred speech. Last seen normal 10 p.m. last night. Stroke. EXAM: CT ANGIOGRAPHY HEAD AND NECK CT PERFUSION BRAIN TECHNIQUE: Multidetector CT imaging of the head and neck was performed using the standard protocol during bolus administration of  intravenous contrast. Multiplanar CT image reconstructions and MIPs were obtained to evaluate the vascular anatomy. Carotid stenosis measurements (when applicable) are obtained utilizing NASCET criteria, using the distal internal carotid diameter as the denominator. Multiphase CT imaging of the brain was performed following IV bolus contrast injection. Subsequent parametric perfusion maps were calculated using RAPID software. CONTRAST:  80 mL Isovue 370 IV COMPARISON:  CT head 11/29/2016 FINDINGS: CTA NECK FINDINGS Aortic arch: Mild atherosclerotic disease in the aortic arch. Proximal great vessels widely patent. Right carotid system: Mild atherosclerotic disease at the right carotid bifurcation. No significant carotid stenosis. Left carotid system: Noncalcified plaque along the medial wall of the left internal carotid artery with mild irregularity but no definite ulceration. Less than 25% diameter stenosis of the left internal carotid artery. Left external carotid artery patent. Vertebral arteries: Both vertebral arteries patent to the basilar without significant stenosis. Skeleton: Moderate cervical spine degenerative change. No acute skeletal abnormality. Other neck: 10 mm rim calcified nodule in the right lobe of the thyroid, with a benign appearance. Smaller partially calcified nodule in the right upper lobe of the thyroid also likely benign. Upper chest: Mild mosaic pattern of lung density in the right lung apex. Question mild edema. Review of the MIP images confirms the above findings CTA HEAD FINDINGS Anterior circulation: Mild atherosclerotic disease in the cavernous carotid bilaterally which is calcified but non stenotic. Left M1 segment occluded. This is hyperdense on CT and appears acute. There is perfusion of the left MCA branches due to collaterals. Posterior circulation: Both vertebral arteries patent to the basilar. PICA patent bilaterally. Basilar patent. Superior cerebellar and posterior cerebral  arteries patent bilaterally without stenosis. Venous sinuses: Patent Anatomic variants: None Delayed phase: Not performed Review of the MIP images confirms the above findings CT Brain Perfusion Findings: CBF (<  30%) Volume: 5 mLmL. However, review of the head CT of earlier today, there is hypodensity left temporal lobe compatible with acute infarct which is larger than indicated on CT perfusion. Perfusion (Tmax>6.0s) volume: 52 mLmL Perfusion (T-max greater than 4 seconds) volume:  99 mL Mismatch Volume: 47 mL.ML. Review of the perfusion images reveals significant penumbra greater than the calculated amount involving the left temporal parietal lobe. 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 Hypodensity left lower temporal lobe compatible with acute infarct. There is significant penumbra in the left temporoparietal lobe. Irregular noncalcified plaque left internal carotid artery could be a source of emboli or not the high non stenotic atherosclerotic disease. No significant stenosis in the carotid or vertebral arteries in the neck. Atherosclerotic calcification in the cavernous carotid bilaterally. These results were called by telephone at the time of interpretation on 11/29/2016 at 10:50 Am to Dr. Rogue Jury , who verbally acknowledged these results. Electronically Signed   By: Franchot Gallo M.D.   On: 11/29/2016 11:11   Dg Chest 2 View  Result Date: 11/29/2016 CLINICAL DATA:  Code stroke, slurred speech, right-sided droop. EXAM: CHEST  2 VIEW COMPARISON:  Chest x-ray dated 01/01/2009. FINDINGS: Stable cardiomegaly. Lungs are clear. No pleural effusion or pneumothorax seen. Atherosclerotic changes noted at the aortic arch. No acute or suspicious osseous finding. IMPRESSION: 1. No active cardiopulmonary disease. No evidence of pneumonia or pulmonary edema. 2. Stable cardiomegaly. 3. Aortic atherosclerosis. Electronically Signed   By: Franki Cabot M.D.   On: 11/29/2016 09:27   Ct Head Wo Contrast  Result Date: 11/29/2016 CLINICAL DATA:  Stroke post thrombectomy EXAM: CT HEAD WITHOUT CONTRAST TECHNIQUE: Contiguous axial images were obtained from the base of the skull through the vertex without intravenous contrast. COMPARISON:  CT head 11/29/2016 FINDINGS: Brain: High-density within the left putamen and left caudate compatible with acute hemorrhage. There is also high density in the superior basal ganglia compatible with extravasation of contrast. The high density area is most likely a mixture of blood and contrast. This area measures approximately 6.1 x 3.5 x 3.7 cm, acute blood volume 39 mL. Hemorrhagic transformation of left temporal lobe infarct. Compression of the left lateral ventricle due to mass-effect. 6 mm midline shift to the right. Interval development of subdural hemorrhage along the tentorium bilaterally. Vascular: Normal arterial and venous enhancement post angiography. Skull: Negative for fracture. Sinuses/Orbits: Mucosal edema in the paranasal sinuses. Normal orbit. Other: None IMPRESSION: Large area of hemorrhage and extravasated contrast in the left basal ganglia, blood volume 39 mL. Hemorrhagic transformation of left temporal infarct. Mild amount of subdural hematoma along the tentorium bilaterally. 6 mm midline shift to the right. These results were called by telephone at the time of interpretation on 11/29/2016 at 2:20 pm to Dr. Luanne Bras , who verbally acknowledged these results. Electronically Signed   By: Franchot Gallo M.D.   On: 11/29/2016 14:21   Ct Head Wo Contrast  Result Date: 11/29/2016 CLINICAL DATA:  Left facial droop.  Stroke symptoms EXAM: CT HEAD WITHOUT CONTRAST TECHNIQUE: Contiguous axial images were obtained from the base of the skull through the vertex without intravenous contrast. COMPARISON:  None. FINDINGS: Brain: Ill-defined hypodensity in the left inferior temporal lobe, suspicious for acute  infarct. Negative for acute hemorrhage. Ventricle size normal. No shift of the midline structures. Vascular: Hyperdense left MCA compatible with thrombus. This correlates with the left temporal lobe infarction. Skull: Negative Sinuses/Orbits: Negative Other: None IMPRESSION:  Hypodensity left temporal lobe consistent with acute infarct. Hyperdense left MCA compatible with thrombus or embolus. Aspects score 8 These results were called by telephone at the time of interpretation on 11/29/2016 at 9:14 am to Dr. Veryl Speak , who verbally acknowledged these results. Electronically Signed   By: Franchot Gallo M.D.   On: 11/29/2016 09:17   Ct Angio Neck W Or Wo Contrast  Result Date: 11/29/2016 CLINICAL DATA:  Right facial droop and slurred speech. Last seen normal 10 p.m. last night. Stroke. EXAM: CT ANGIOGRAPHY HEAD AND NECK CT PERFUSION BRAIN TECHNIQUE: Multidetector CT imaging of the head and neck was performed using the standard protocol during bolus administration of intravenous contrast. Multiplanar CT image reconstructions and MIPs were obtained to evaluate the vascular anatomy. Carotid stenosis measurements (when applicable) are obtained utilizing NASCET criteria, using the distal internal carotid diameter as the denominator. Multiphase CT imaging of the brain was performed following IV bolus contrast injection. Subsequent parametric perfusion maps were calculated using RAPID software. CONTRAST:  80 mL Isovue 370 IV COMPARISON:  CT head 11/29/2016 FINDINGS: CTA NECK FINDINGS Aortic arch: Mild atherosclerotic disease in the aortic arch. Proximal great vessels widely patent. Right carotid system: Mild atherosclerotic disease at the right carotid bifurcation. No significant carotid stenosis. Left carotid system: Noncalcified plaque along the medial wall of the left internal carotid artery with mild irregularity but no definite ulceration. Less than 25% diameter stenosis of the left internal carotid artery. Left  external carotid artery patent. Vertebral arteries: Both vertebral arteries patent to the basilar without significant stenosis. Skeleton: Moderate cervical spine degenerative change. No acute skeletal abnormality. Other neck: 10 mm rim calcified nodule in the right lobe of the thyroid, with a benign appearance. Smaller partially calcified nodule in the right upper lobe of the thyroid also likely benign. Upper chest: Mild mosaic pattern of lung density in the right lung apex. Question mild edema. Review of the MIP images confirms the above findings CTA HEAD FINDINGS Anterior circulation: Mild atherosclerotic disease in the cavernous carotid bilaterally which is calcified but non stenotic. Left M1 segment occluded. This is hyperdense on CT and appears acute. There is perfusion of the left MCA branches due to collaterals. Posterior circulation: Both vertebral arteries patent to the basilar. PICA patent bilaterally. Basilar patent. Superior cerebellar and posterior cerebral arteries patent bilaterally without stenosis. Venous sinuses: Patent Anatomic variants: None Delayed phase: Not performed Review of the MIP images confirms the above findings CT Brain Perfusion Findings: CBF (<30%) Volume: 5 mLmL. However, review of the head CT of earlier today, there is hypodensity left temporal lobe compatible with acute infarct which is larger than indicated on CT perfusion. Perfusion (Tmax>6.0s) volume: 52 mLmL Perfusion (T-max greater than 4 seconds) volume:  99 mL Mismatch Volume: 47 mL.ML. Review of the perfusion images reveals significant penumbra greater than the calculated amount involving the left temporal parietal lobe. 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 Hypodensity left lower temporal lobe compatible with acute infarct. There is significant penumbra in the left temporoparietal lobe. Irregular noncalcified plaque left internal carotid artery could  be a source of emboli or not the high non stenotic atherosclerotic disease. No significant stenosis in the carotid or vertebral arteries in the neck. Atherosclerotic calcification in the cavernous carotid bilaterally. These results were called by telephone at the time of interpretation on 11/29/2016 at 10:50 Am to Dr. Rogue Jury , who verbally acknowledged these results. Electronically Signed   By:  Franchot Gallo M.D.   On: 11/29/2016 11:11   Mr Brain Wo Contrast  Result Date: 11/30/2016 CLINICAL DATA:  76 year old female who awoke with aphasia and right facial droop. Left M1 occlusion status post neuro endovascular revascularization. Subsequent large left hemisphere hemorrhage. EXAM: MRI HEAD WITHOUT CONTRAST TECHNIQUE: Multiplanar, multiecho pulse sequences of the brain and surrounding structures were obtained without intravenous contrast. COMPARISON:  Post endovascular Head CT 11/29/2016, and earlier. FINDINGS: Brain: Cortical and subcortical white matter restricted diffusion along the anterior left temporal tip and tracking posteriorly along the left superior and middle temporal gyri (Series 5, image 19). Superimposed intracranial hemorrhage centered at the left basal ganglia with T2 and T1 heterogeneous blood products encompassing 55 x 30 x 31 mm (AP by transverse by CC) for an estimated blood volume of 26 mL. Associated diffusion susceptibility in the area of the hemorrhage, but there also seems to be some marginal restricted diffusion. Small volume of intraventricular hemorrhage layering in the occipital horns. Effaced left lateral ventricle. No ventriculomegaly. Edema surrounding the area of hemorrhage, and cytotoxic edema in the left temporal lobe. Rightward midline shift of 7 mm. Patent basilar cisterns. There are occasional small foci of restricted diffusion in the left PCA territory including the dorsal left thalamus and anterior left occipital lobe. No posterior fossa or contralateral right  hemisphere diffusion restriction. Patchy nonspecific T2 hyperintensity in the pons. No cortical encephalomalacia. No chronic cerebral blood products. Negative pituitary and cervicomedullary junction. Vascular: Major intracranial vascular flow voids are preserved. Skull and upper cervical spine: Negative. Sinuses/Orbits: Normal orbits soft tissues. Visualized paranasal sinuses and mastoids are stable and well pneumatized. Other: Layering fluid in the pharynx.  Negative scalp soft tissues. IMPRESSION: 1. Left MCA infarct with the most confluent diffusion restriction in the left temporal lobe. Superimposed 5.5 cm intra-axial hemorrhage centered at the left basal ganglia with intra-axial blood volume estimated 26 mL. Associated left hemisphere edema. 2. Rightward midline shift of 7 mm.  Basilar cisterns remain patent. 3. Effaced left lateral ventricle and trace intraventricular hemorrhage without ventriculomegaly. 4. Occasional small foci of restricted diffusion also at the left PCA territory. Electronically Signed   By: Genevie Ann M.D.   On: 11/30/2016 13:59   Ct Cerebral Perfusion W Contrast  Result Date: 11/29/2016 CLINICAL DATA:  Right facial droop and slurred speech. Last seen normal 10 p.m. last night. Stroke. EXAM: CT ANGIOGRAPHY HEAD AND NECK CT PERFUSION BRAIN TECHNIQUE: Multidetector CT imaging of the head and neck was performed using the standard protocol during bolus administration of intravenous contrast. Multiplanar CT image reconstructions and MIPs were obtained to evaluate the vascular anatomy. Carotid stenosis measurements (when applicable) are obtained utilizing NASCET criteria, using the distal internal carotid diameter as the denominator. Multiphase CT imaging of the brain was performed following IV bolus contrast injection. Subsequent parametric perfusion maps were calculated using RAPID software. CONTRAST:  80 mL Isovue 370 IV COMPARISON:  CT head 11/29/2016 FINDINGS: CTA NECK FINDINGS Aortic  arch: Mild atherosclerotic disease in the aortic arch. Proximal great vessels widely patent. Right carotid system: Mild atherosclerotic disease at the right carotid bifurcation. No significant carotid stenosis. Left carotid system: Noncalcified plaque along the medial wall of the left internal carotid artery with mild irregularity but no definite ulceration. Less than 25% diameter stenosis of the left internal carotid artery. Left external carotid artery patent. Vertebral arteries: Both vertebral arteries patent to the basilar without significant stenosis. Skeleton: Moderate cervical spine degenerative change. No acute skeletal abnormality. Other neck:  10 mm rim calcified nodule in the right lobe of the thyroid, with a benign appearance. Smaller partially calcified nodule in the right upper lobe of the thyroid also likely benign. Upper chest: Mild mosaic pattern of lung density in the right lung apex. Question mild edema. Review of the MIP images confirms the above findings CTA HEAD FINDINGS Anterior circulation: Mild atherosclerotic disease in the cavernous carotid bilaterally which is calcified but non stenotic. Left M1 segment occluded. This is hyperdense on CT and appears acute. There is perfusion of the left MCA branches due to collaterals. Posterior circulation: Both vertebral arteries patent to the basilar. PICA patent bilaterally. Basilar patent. Superior cerebellar and posterior cerebral arteries patent bilaterally without stenosis. Venous sinuses: Patent Anatomic variants: None Delayed phase: Not performed Review of the MIP images confirms the above findings CT Brain Perfusion Findings: CBF (<30%) Volume: 5 mLmL. However, review of the head CT of earlier today, there is hypodensity left temporal lobe compatible with acute infarct which is larger than indicated on CT perfusion. Perfusion (Tmax>6.0s) volume: 52 mLmL Perfusion (T-max greater than 4 seconds) volume:  99 mL Mismatch Volume: 47 mL.ML. Review of  the perfusion images reveals significant penumbra greater than the calculated amount involving the left temporal parietal lobe. 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 Hypodensity left lower temporal lobe compatible with acute infarct. There is significant penumbra in the left temporoparietal lobe. Irregular noncalcified plaque left internal carotid artery could be a source of emboli or not the high non stenotic atherosclerotic disease. No significant stenosis in the carotid or vertebral arteries in the neck. Atherosclerotic calcification in the cavernous carotid bilaterally. These results were called by telephone at the time of interpretation on 11/29/2016 at 10:50 Am to Dr. Rogue Jury , who verbally acknowledged these results. Electronically Signed   By: Franchot Gallo M.D.   On: 11/29/2016 11:11   Dg Chest Port 1 View  Result Date: 12/01/2016 CLINICAL DATA:  Stroke, history of hypertension, extubation of the trachea and esophagus. EXAM: PORTABLE CHEST 1 VIEW COMPARISON:  Portable chest x-ray of Nov 29, 2016. FINDINGS: The lungs are adequately inflated. There is no focal infiltrate. The retrocardiac lung markings on the left remain mildly prominent. There is no significant pleural effusion and there is no pneumothorax. The heart is top-normal in size. The pulmonary vascularity is normal. There is calcification in the wall of the thoracic aorta. IMPRESSION: Good inflation of both lungs since extubation. Minimal subsegmental atelectasis at the left lung base is suspected. No CHF. Thoracic aortic atherosclerosis. Electronically Signed   By: David  Martinique M.D.   On: 12/01/2016 07:27   Dg Chest Port 1 View  Result Date: 11/30/2016 CLINICAL DATA:  Confirm orogastric tube placement. EXAM: PORTABLE CHEST 1 VIEW COMPARISON:  Chest radiograph earlier this day at 0907 hour FINDINGS: Endotracheal tube is 2.3 cm from the carina. Enteric tube in  place, tip and side-port below the diaphragm. Low lung volumes. Borderline cardiomegaly. No pulmonary edema. No large pleural effusion. No pneumothorax. IMPRESSION: Endotracheal tube approximately 2.3 cm from the carina. Enteric tube in place. Borderline cardiomegaly.  No new abnormality. Electronically Signed   By: Jeb Levering M.D.   On: 11/30/2016 00:28   Dg Abd Portable 1v  Result Date: 11/30/2016 CLINICAL DATA:  76 y/o  F; enteric tube placement. EXAM: PORTABLE ABDOMEN - 1 VIEW COMPARISON:  None. FINDINGS: Nonobstructive bowel gas pattern. Enteric tube tip projects over the stomach. Advanced lumbar levocurvature  and lower lumbar fusion changes. IMPRESSION: Enteric tube tip projects over the mid stomach. Electronically Signed   By: Kristine Garbe M.D.   On: 11/30/2016 00:26   Ir Percutaneous Art Thrombectomy/infusion Intracranial Inc Diag Angio  Result Date: 11/30/2016 INDICATION: Patient presenting with aphasia and right-sided weakness with left gaze preference. 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 CBF < 30 % 5 ml,Tmax > 6 secs of 52 ml with a mismatch vol of 63m. EXAM: 1. EMERGENT LARGE VESSEL OCCLUSION THROMBOLYSIS (anterior CIRCULATION) 2. COMPARISON:  CT angiogram of the head and neck, and CT perfusion study of 11/29/2016. MEDICATIONS: Vancomycin 1.5 mg IV was administered within 1 hour of the procedure. ANESTHESIA/SEDATION: General anesthesia. CONTRAST:  Isovue 300 approximately 75 cc. FLUOROSCOPY TIME:  Fluoroscopy Time: 28 minutes 36 seconds (1539 mGy). COMPLICATIONS: None immediate. TECHNIQUE: Following a full explanation of the procedure along with the potential associated complications, an informed witnessed consent was obtained. The risks of intracranial hemorrhage of 10%, worsening neurological deficit, ventilator dependency, death and inability to revascularize were all  reviewed in detail with the patient's . The patient was then put under general [facility] The right groin was prepped and draped in the usual sterile fashion. Thereafter using modified Seldinger technique, transfemoral access into the right common femoral artery was obtained without difficulty. Over a 0.035 inch guidewire a 5 French Pinnacle sheath was inserted. Through this, and also over a 0.035 inch guidewire a 5 [place] 1 catheter was advanced to the aortic arch region and selectively positioned in the right common carotid artery and the left common carotid artery, and the innominate artery. FINDINGS: The right subclavian arteriogram demonstrates the right vertebral artery to opacify normally to the cranial skull base. Flow is noted into the right vertebrobasilar junction and the right posterior-inferior cerebellar artery. Flash opacification of the proximal basilar artery is also noted. The right common carotid arteriogram demonstrates the right external carotid artery and its major branches to be widely patent. The right internal carotid artery at the bulb to the cranial skull base opacifies widely. The petrous, the cavernous and the supraclinoid segments are widely patent. The right middle cerebral artery and the right anterior cerebral artery opacify into the capillary and venous phases. Flash opacification via the anterior communicating artery of the left anterior cerebral artery A2 segment is also noted. Also noted is contrast blush in the region of the middle terminate of the right maxillary sinus most consistent with inflammatory reaction. The left common carotid arteriogram demonstrates the left external carotid artery and its major branches to be widely patent. The left internal carotid artery at the bulb to the cranial skull base opacifies widely. There are mild FMD-like changes noted in the mid left internal carotid artery mid cervical  segment. The petrous, the cavernous and the supraclinoid segments demonstrate wide patency. A left posterior communicating artery is seen opacifying the left posterior cerebral artery distribution. The left anterior cerebral artery opacifies into the capillary and venous phases with flash filling via the anterior communicating artery of the right anterior cerebral artery A2 and A1 segments. The left middle cerebral artery demonstrates complete angiographic occlusion in its mid M1 segment with delayed opacification of an anterior temporal branch with prominent filling defects proximally. The M1 segment, otherwise, remains completely occluded. The delayed arterial phase in the lateral projection demonstrates partial retrograde  opacification of the distal perisylvian branches with large area of hypoperfusion involving the lentiform nucleus and the caudate head. PROCEDURE: ENDOVASCULAR COMPLETE REVASCULARIZATION OF OCCLUDED LEFT MIDDLE CEREBRAL ARTERY M1 SEGMENT The diagnostic JB 1 catheter in the left common carotid artery was then exchanged over a 0.035 inch 300 cm Rosen exchange guidewire for an 8 French 55 cm Brite tip neurovascular sheath using biplane roadmap technique and constant fluoroscopic guidance. Good aspiration was obtained from the side port of the neurovascular sheath. This was then connected to continuous heparinized saline infusion. Over the [employer] guidewire, an 8 [place] 85 cm FlowGate balloon guide catheter which had been prepped with 50% contrast and 50% heparinized saline infusion was advanced and positioned in the distal left common carotid artery. The guidewire was removed. Good aspiration was obtained from the hub of the 8 [place] FlowGate guide catheter. A gentle contrast injection demonstrated no evidence of spasms, dissections or of intraluminal filling defects. At this time, using biplane roadmap technique and constant fluoroscopic guidance, over a 0.035 inch Roadrunner guidewire, the  8 [place] FlowGate guide catheter was then advanced to the distal cervical left ICA without difficulty. Good aspiration was obtained after removal of the wire. A gentle control arteriogram demonstrated mild spasm with no impediment of distal flow. Patient was treated with 1 aliquots of 25 mics of nitroglycerin with relief of the vasospasm. At this time, in a coaxial manner and with constant heparinized saline infusion using biplane roadmap technique and constant fluoroscopic guidance, a Trevo ProVue 021 microcatheter was advanced over a 0.014 inch Softip Synchro micro guidewire to the distal end of the FlowGate guide catheter. With the micro guidewire leading with a J-tip configuration the combination was navigated to the supraclinoid right ICA. A torque device was then used to manipulate the guidewire into the left middle cerebral artery followed by the microcatheter. The micro guidewire was then advanced without difficulty into the M2 M3 region of the inferior division of left middle cerebral artery followed by the microcatheter. The guidewire was removed. Good aspiration was obtained from the hub of the microcatheter. A gentle contrast injection demonstrated antegrade flow of contrast. At this time, a 4 mm x 40 mm Solitaire FR retrieval device was advanced in a coaxial manner and with constant heparinized saline infusion using biplane roadmap technique and constant fluoroscopic guidance to the distal portion of the microcatheter. At this time the entire system was straightened by loosening the O ring on the delivery microcatheter. After having ascertained the distal and the proximal positioning of the retrieval device, with slight forward gentle traction with the right hand on the delivery micro guidewire, with the left hand the microcatheter was retrieved unsheathing the entire Solitaire FR retrieval device with the tip of the microcatheter just proximal to the proximal landing zone of the retrieval device. A  control arteriogram performed through the 8 [place] FlowGate guide catheter demonstrated significantly improved caliber and flow through the middle cerebral artery distribution with a narrowing of the retrieval device in the distal M1 segment. At this time, the balloon of the [facility] guide catheter was inflated in the distal left internal carotid artery for proximal flow arrest. The proximal portion of the retrieval device was captured into the microcatheter. There on after whilst aspirating with a 60 mL syringe at the hub of the [facility] guide catheter, the combination of the retrieval device and the microcatheter were gently retrieved and removed. The aspirate demonstrated prominent clot measuring approximately 2 mm x 4 mm. Aspiration  was continued as the balloon was deflated in the left internal carotid artery. The FlowGate guide catheter was gently retrieved more proximally after deflation of the balloon. A control arteriogram performed through the 8 [place] FlowGate guide catheter demonstrated complete angiographic revascularization of the left occluded left middle cerebral artery with patency of the left posterior cerebral and left anterior cerebral arteries. Focal segmental spasm was noted of the left middle cerebral artery involving the inferior division in the distal M1 region. This responded to 4 aliquots of 25 mics of nitroglycerin intra-arterially. A final control arteriogram performed demonstrated significant improved caliber of the previously noted vasospasm of the left middle cerebral artery, and also the left internal carotid artery mid cervical segment. Also noted of the final to control arteriograms a focal area of hyper density in the left centrum semiovale region. No evidence of angiographic extravasation or mass effect was seen. Patient's hemodynamic status, blood pressure and heart rate remained stable throughout the procedure. The 8 [place] FlowGate guide catheter and the 8 [place] neurovascular  sheath were then retrieved into the abdominal aorta and exchanged over a J-tip guidewire for an 8 French Pinnacle sheath which in turn was successfully removed with the application of a closure device. The right groin showed no evidence of a hematoma or hemorrhage. The distal pulses in both feet remained Dopplerable unchanged from prior to the procedure. IMPRESSION: Status post endovascular complete revascularization of occluded left middle cerebral artery with 1 pass with the Solitaire FR 4 mm x 40 mm retrieval device, with achievement of a TICI 3 reperfusion. Groin puncture to initial reperfusion TICI2b 32 minutes. Groin puncture to TICI reperfusion 38 minutes PLAN: The patient was transferred to the CT scanner for postprocedural CT scan of the brain. Electronically Signed   By: Julieanne Cotton M.D.   On: 11/29/2016 14:42   Ir Angio Intra Extracran Sel Com Carotid Innominate Uni L Mod Sed  Result Date: 11/30/2016 INDICATION: Patient presenting with aphasia and right-sided weakness with left gaze preference. 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 CBF < 30 % 5 ml,Tmax > 6 secs of 52 ml with a mismatch vol of 68ml. EXAM: 1. EMERGENT LARGE VESSEL OCCLUSION THROMBOLYSIS (anterior CIRCULATION) 2. COMPARISON:  CT angiogram of the head and neck, and CT perfusion study of 11/29/2016. MEDICATIONS: Vancomycin 1.5 mg IV was administered within 1 hour of the procedure. ANESTHESIA/SEDATION: General anesthesia. CONTRAST:  Isovue 300 approximately 75 cc. FLUOROSCOPY TIME:  Fluoroscopy Time: 28 minutes 36 seconds (1539 mGy). COMPLICATIONS: None immediate. TECHNIQUE: Following a full explanation of the procedure along with the potential associated complications, an informed witnessed consent was obtained. The risks of intracranial hemorrhage of 10%, worsening neurological deficit, ventilator dependency, death and inability  to revascularize were all reviewed in detail with the patient's . The patient was then put under general anesthesia by the Department of Anesthesiology at [facility]. The right groin was prepped and draped in the usual sterile fashion. Thereafter using modified Seldinger technique, transfemoral access into the right common femoral artery was obtained without difficulty. Over a 0.035 inch guidewire a 5 French Pinnacle sheath was inserted. Through this, and also over a 0.035 inch guidewire a 5 [place] JB 1 catheter was advanced to the aortic arch region and selectively positioned in the right common carotid artery and the left common carotid artery, and the innominate artery. FINDINGS: The right subclavian arteriogram demonstrates  the right vertebral artery to opacify normally to the cranial skull base. Flow is noted into the right vertebrobasilar junction and the right posterior-inferior cerebellar artery. Flash opacification of the proximal basilar artery is also noted. The right common carotid arteriogram demonstrates the right external carotid artery and its major branches to be widely patent. The right internal carotid artery at the bulb to the cranial skull base opacifies widely. The petrous, the cavernous and the supraclinoid segments are widely patent. The right middle cerebral artery and the right anterior cerebral artery opacify into the capillary and venous phases. Flash opacification via the anterior communicating artery of the left anterior cerebral artery A2 segment is also noted. Also noted is contrast blush in the region of the middle terminate of the right maxillary sinus most consistent with inflammatory reaction. The left common carotid arteriogram demonstrates the left external carotid artery and its major branches to be widely patent. The left internal carotid artery at the bulb to the cranial skull base opacifies widely. There are mild FMD-like changes noted in the mid left internal  carotid artery mid cervical segment. The petrous, the cavernous and the supraclinoid segments demonstrate wide patency. A left posterior communicating artery is seen opacifying the left posterior cerebral artery distribution. The left anterior cerebral artery opacifies into the capillary and venous phases with flash filling via the anterior communicating artery of the right anterior cerebral artery A2 and A1 segments. The left middle cerebral artery demonstrates complete angiographic occlusion in its mid M1 segment with delayed opacification of an anterior temporal branch with prominent filling defects proximally. The M1 segment, otherwise, remains completely occluded. The delayed arterial phase in the lateral projection demonstrates partial retrograde opacification of the distal perisylvian branches with large area of hypoperfusion involving the lentiform nucleus and the caudate head. PROCEDURE: ENDOVASCULAR COMPLETE REVASCULARIZATION OF OCCLUDED LEFT MIDDLE CEREBRAL ARTERY M1 SEGMENT The diagnostic JB 1 catheter in the left common carotid artery was then exchanged over a 0.035 inch 300 cm Rosen exchange guidewire for an 8 [place] 55 cm Brite tip neurovascular sheath using biplane roadmap technique and constant fluoroscopic guidance. Good aspiration was obtained from the side port of the neurovascular sheath. This was then connected to continuous heparinized saline infusion. Over the [employer] guidewire, an 8 [place] 85 cm FlowGate balloon guide catheter which had been prepped with 50% contrast and 50% heparinized saline infusion was advanced and positioned in the distal left common carotid artery. The guidewire was removed. Good aspiration was obtained from the hub of the 8 [place] FlowGate guide catheter. A gentle contrast injection demonstrated no evidence of spasms, dissections or of intraluminal filling defects. At this time, using biplane roadmap technique and constant fluoroscopic guidance, over a 0.035  inch Roadrunner guidewire, the 8 [place] FlowGate guide catheter was then advanced to the distal cervical left ICA without difficulty. Good aspiration was obtained after removal of the wire. A gentle control arteriogram demonstrated mild spasm with no impediment of distal flow. Patient was treated with 1 aliquots of 25 mics of nitroglycerin with relief of the vasospasm. At this time, in a coaxial manner and with constant heparinized saline infusion using biplane roadmap technique and constant fluoroscopic guidance, a Trevo ProVue 021 microcatheter was advanced over a 0.014 inch Softip Synchro micro guidewire to the distal end of the FlowGate guide catheter. With the micro guidewire leading with a J-tip configuration the combination was navigated to the supraclinoid right ICA. A torque device was then used to manipulate the guidewire into  the left middle cerebral artery followed by the microcatheter. The micro guidewire was then advanced without difficulty into the M2 M3 region of the inferior division of left middle cerebral artery followed by the microcatheter. The guidewire was removed. Good aspiration was obtained from the hub of the microcatheter. A gentle contrast injection demonstrated antegrade flow of contrast. At this time, a 4 mm x 40 mm Solitaire FR retrieval device was advanced in a coaxial manner and with constant heparinized saline infusion using biplane roadmap technique and constant fluoroscopic guidance to the distal portion of the microcatheter. At this time the entire system was straightened by loosening the O ring on the delivery microcatheter. After having ascertained the distal and the proximal positioning of the retrieval device, with slight forward gentle traction with the right hand on the delivery micro guidewire, with the left hand the microcatheter was retrieved unsheathing the entire Solitaire FR retrieval device with the tip of the microcatheter just proximal to the proximal landing zone  of the retrieval device. A control arteriogram performed through the 8 [place] FlowGate guide catheter demonstrated significantly improved caliber and flow through the middle cerebral artery distribution with a narrowing of the retrieval device in the distal M1 segment. At this time, the balloon of the [facility] guide catheter was inflated in the distal left internal carotid artery for proximal flow arrest. The proximal portion of the retrieval device was captured into the microcatheter. There on after whilst aspirating with a 60 mL syringe at the hub of the [facility] guide catheter, the combination of the retrieval device and the microcatheter were gently retrieved and removed. The aspirate demonstrated prominent clot measuring approximately 2 mm x 4 mm. Aspiration was continued as the balloon was deflated in the left internal carotid artery. The FlowGate guide catheter was gently retrieved more proximally after deflation of the balloon. A control arteriogram performed through the 8 [place] FlowGate guide catheter demonstrated complete angiographic revascularization of the left occluded left middle cerebral artery with patency of the left posterior cerebral and left anterior cerebral arteries. Focal segmental spasm was noted of the left middle cerebral artery involving the inferior division in the distal M1 region. This responded to 4 aliquots of 25 mics of nitroglycerin intra-arterially. A final control arteriogram performed demonstrated significant improved caliber of the previously noted vasospasm of the left middle cerebral artery, and also the left internal carotid artery mid cervical segment. Also noted of the final to control arteriograms a focal area of hyper density in the left centrum semiovale region. No evidence of angiographic extravasation or mass effect was seen. Patient's hemodynamic status, blood pressure and heart rate remained stable throughout the procedure. The 8 [place] FlowGate guide catheter and  the 8 [place] neurovascular sheath were then retrieved into the abdominal aorta and exchanged over a J-tip guidewire for an 8 French Pinnacle sheath which in turn was successfully removed with the application of a closure device. The right groin showed no evidence of a hematoma or hemorrhage. The distal pulses in both feet remained Dopplerable unchanged from prior to the procedure. IMPRESSION: Status post endovascular complete revascularization of occluded left middle cerebral artery with 1 pass with the Solitaire FR 4 mm x 40 mm retrieval device, with achievement of a TICI 3 reperfusion. Groin puncture to initial reperfusion TICI2b 32 minutes. Groin puncture to TICI reperfusion 38 minutes PLAN: The patient was transferred to the CT scanner for postprocedural CT scan of the brain. Electronically Signed   By: Corky Downs.D.  On: 11/29/2016 14:42   Ir Angio Vertebral Sel Subclavian Innominate Uni R Mod Sed  Result Date: 11/30/2016 INDICATION: Patient presenting with aphasia and right-sided weakness with left gaze preference. 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 CBF < 30 % 5 ml,Tmax > 6 secs of 52 ml with a mismatch vol of 11m. EXAM: 1. EMERGENT LARGE VESSEL OCCLUSION THROMBOLYSIS (anterior CIRCULATION) 2. COMPARISON:  CT angiogram of the head and neck, and CT perfusion study of 11/29/2016. MEDICATIONS: Vancomycin 1.5 mg IV was administered within 1 hour of the procedure. ANESTHESIA/SEDATION: General anesthesia. CONTRAST:  Isovue 300 approximately 75 cc. FLUOROSCOPY TIME:  Fluoroscopy Time: 28 minutes 36 seconds (1539 mGy). COMPLICATIONS: None immediate. TECHNIQUE: Following a full explanation of the procedure along with the potential associated complications, an informed witnessed consent was obtained. The risks of intracranial hemorrhage of 10%, worsening neurological deficit, ventilator dependency,  death and inability to revascularize were all reviewed in detail with the patient's . The patient was then put [facility] general [facility] The right groin was prepped and draped in the usual sterile fashion. Thereafter using modified Seldinger technique, transfemoral access into the right common femoral artery was obtained without difficulty. Over a 0.035 inch guidewire a 5 French Pinnacle sheath was inserted. Through this, and also over a 0.035 inch guidewire a 5 [place] 1 catheter was advanced to the aortic arch region and selectively positioned in the right common carotid artery and the left common carotid artery, and the innominate artery. FINDINGS: The right subclavian arteriogram demonstrates the right vertebral artery to opacify normally to the cranial skull base. Flow is noted into the right vertebrobasilar junction and the right posterior-inferior cerebellar artery. Flash opacification of the proximal basilar artery is also noted. The right common carotid arteriogram demonstrates the right external carotid artery and its major branches to be widely patent. The right internal carotid artery at the bulb to the cranial skull base opacifies widely. The petrous, the cavernous and the supraclinoid segments are widely patent. The right middle cerebral artery and the right anterior cerebral artery opacify into the capillary and venous phases. Flash opacification via the anterior communicating artery of the left anterior cerebral artery A2 segment is also noted. Also noted is contrast blush in the region of the middle terminate of the right maxillary sinus most consistent with inflammatory reaction. The left common carotid arteriogram demonstrates the left external carotid artery and its major branches to be widely patent. The left internal carotid artery at the bulb to the cranial skull base opacifies widely. There are mild FMD-like changes noted in the mid  left internal carotid artery mid cervical segment. The petrous, the cavernous and the supraclinoid segments demonstrate wide patency. A left posterior communicating artery is seen opacifying the left posterior cerebral artery distribution. The left anterior cerebral artery opacifies into the capillary and venous phases with flash filling via the anterior communicating artery of the right anterior cerebral artery A2 and A1 segments. The left middle cerebral artery demonstrates complete angiographic occlusion in its mid M1 segment with delayed opacification of an anterior temporal branch with prominent filling defects proximally. The M1 segment, otherwise, remains completely occluded. The delayed arterial phase in the lateral projection demonstrates partial retrograde opacification of the distal perisylvian branches with large area of hypoperfusion involving the lentiform nucleus and the caudate head. PROCEDURE: ENDOVASCULAR COMPLETE REVASCULARIZATION  OF OCCLUDED LEFT MIDDLE CEREBRAL ARTERY M1 SEGMENT The diagnostic JB 1 catheter in the left common carotid artery was then exchanged over a 0.035 inch 300 cm Rosen exchange guidewire for an 8 French 55 cm Brite tip neurovascular sheath using biplane roadmap technique and constant fluoroscopic guidance. Good aspiration was obtained from the side port of the neurovascular sheath. This was then connected to continuous heparinized saline infusion. Over the [employer] guidewire, an 8 [place] 85 cm FlowGate balloon guide catheter which had been prepped with 50% contrast and 50% heparinized saline infusion was advanced and positioned in the distal left common carotid artery. The guidewire was removed. Good aspiration was obtained from the hub of the 8 [place] FlowGate guide catheter. A gentle contrast injection demonstrated no evidence of spasms, dissections or of intraluminal filling defects. At this time, using biplane roadmap technique and constant fluoroscopic guidance,  over a 0.035 inch Roadrunner guidewire, the 8 [place] FlowGate guide catheter was then advanced to the distal cervical left ICA without difficulty. Good aspiration was obtained after removal of the wire. A gentle control arteriogram demonstrated mild spasm with no impediment of distal flow. Patient was treated with 1 aliquots of 25 mics of nitroglycerin with relief of the vasospasm. At this time, in a coaxial manner and with constant heparinized saline infusion using biplane roadmap technique and constant fluoroscopic guidance, a Trevo ProVue 021 microcatheter was advanced over a 0.014 inch Softip Synchro micro guidewire to the distal end of the FlowGate guide catheter. With the micro guidewire leading with a J-tip configuration the combination was navigated to the supraclinoid right ICA. A torque device was then used to manipulate the guidewire into the left middle cerebral artery followed by the microcatheter. The micro guidewire was then advanced without difficulty into the M2 M3 region of the inferior division of left middle cerebral artery followed by the microcatheter. The guidewire was removed. Good aspiration was obtained from the hub of the microcatheter. A gentle contrast injection demonstrated antegrade flow of contrast. At this time, a 4 mm x 40 mm Solitaire FR retrieval device was advanced in a coaxial manner and with constant heparinized saline infusion using biplane roadmap technique and constant fluoroscopic guidance to the distal portion of the microcatheter. At this time the entire system was straightened by loosening the O ring on the delivery microcatheter. After having ascertained the distal and the proximal positioning of the retrieval device, with slight forward gentle traction with the right hand on the delivery micro guidewire, with the left hand the microcatheter was retrieved unsheathing the entire Solitaire FR retrieval device with the tip of the microcatheter just proximal to the proximal  landing zone of the retrieval device. A control arteriogram performed through the 8 [place] FlowGate guide catheter demonstrated significantly improved caliber and flow through the middle cerebral artery distribution with a narrowing of the retrieval device in the distal M1 segment. At this time, the balloon of the [facility] guide catheter was inflated in the distal left internal carotid artery for proximal flow arrest. The proximal portion of the retrieval device was captured into the microcatheter. There on after whilst aspirating with a 60 mL syringe at the hub of the [facility] guide catheter, the combination of the retrieval device and the microcatheter were gently retrieved and removed. The aspirate demonstrated prominent clot measuring approximately 2 mm x 4 mm. Aspiration was continued as the balloon was deflated in the left internal carotid artery. The [facility] guide catheter was gently retrieved more proximally after  deflation of the balloon. A control arteriogram performed through the 8 [place] FlowGate guide catheter demonstrated complete angiographic revascularization of the left occluded left middle cerebral artery with patency of the left posterior cerebral and left anterior cerebral arteries. Focal segmental spasm was noted of the left middle cerebral artery involving the inferior division in the distal M1 region. This responded to 4 aliquots of 25 mics of nitroglycerin intra-arterially. A final control arteriogram performed demonstrated significant improved caliber of the previously noted vasospasm of the left middle cerebral artery, and also the left internal carotid artery mid cervical segment. Also noted of the final to control arteriograms a focal area of hyper density in the left centrum semiovale region. No evidence of angiographic extravasation or mass effect was seen. Patient's hemodynamic status, blood pressure and heart rate remained stable throughout the procedure. The 8 [place] FlowGate guide  catheter and the 8 [place] neurovascular sheath were then retrieved into the abdominal aorta and exchanged over a J-tip guidewire for an 8 French Pinnacle sheath which in turn was successfully removed with the application of a closure device. The right groin showed no evidence of a hematoma or hemorrhage. The distal pulses in both feet remained Dopplerable unchanged from prior to the procedure. IMPRESSION: Status post endovascular complete revascularization of occluded left middle cerebral artery with 1 pass with the Solitaire FR 4 mm x 40 mm retrieval device, with achievement of a TICI 3 reperfusion. Groin puncture to initial reperfusion TICI2b 32 minutes. Groin puncture to TICI reperfusion 38 minutes PLAN: The patient was transferred to the CT scanner for postprocedural CT scan of the brain. Electronically Signed   By: Luanne Bras M.D.   On: 11/29/2016 14:42    ECG & Cardiac Imaging    EKG 12/01/16:  NSR  EKG 11/29/16: sinus rhythm, LBBB  Echocardiogram 12/01/16: pending  Echocardiogram 08/19/16: Study Conclusions - Left ventricle: LV apical band The estimated ejection fraction   was 55%. Left ventricular diastolic function parameters were   normal. - Aortic valve: There was mild regurgitation. - Mitral valve: Calcified annulus. Mildly thickened leaflets .   There was mild regurgitation. - Left atrium: The atrium was mildly dilated. - Atrial septum: No defect or patent foramen ovale was identified. - Pericardium, extracardiac: A trivial pericardial effusion was   identified posterior to the heart.   Assessment & Plan    1. Left MCA infarct s/p mechanical thrombectomy and revascularization; post IR large left basal ganglia hemorrhage with right shift - per primary team   2. Atrial fibrillation noted on telemetry - pt appeared to be in NSR upon arrival to ED and was in NSR on exam today - Bouts of rate-controlled Afib on telemetry vs sinus arrhythmia, no EKG confirmation of Afib  in EPIC. Obtained 2 EKGs on my exam today, one was NSR and one with sinus rhythm with premature complexes. Question if she was having bouts of afib, although this could be an expected outcome given her recent stress. May recommend event monitor or loop recorder after she recovers to evaluate arrhythmias that may have contributed to her stroke. This patients CHA2DS2-VASc Score and unadjusted Ischemic Stroke Rate (% per year) is equal to 4.8 % stroke rate/year from a score of 4 (age, stroke). However, no anticoagulation in the presence of brain hemorrhage. - would recommend small doses of IV lopressor if rate control is needed, which has less effects on BP - Hypokalemia replaced per primary team; Mg and TSH are WNL; free T4 is elevated -  troponin was elevated to 1.86, likely representing demand ischemia in the setting of stroke and leukocytosis.   3. HTN - per primary team for strict goals   4. Elevated triglycerides - TG 175 this admission - consider adding fenofibrate   Signed, Ledora Bottcher, PA-C 12/01/2016, 3:46 PM [phone]   Attending Note:   The patient was seen and examined.  Agree with assessment and plan as noted above.  Changes made to the above note as needed.  Patient seen and independently examined with Doreene Adas, PA .   We discussed all aspects of the encounter. I agree with the assessment and plan as stated above.  1. Question of atrial fibrillation: We were called to evaluate this patient with what appears to be atrial fibrillation. She originally presented with an embolic stroke. Her heart rate has been quite irregular on the monitor but he the rhythm strips recorded that are available for review show sinus rhythm with numerous premature atrial contractions.  At present we don't have any clear-cut evidence of atrial fibrillation.  She presented with a stroke and while getting a mechanical thrombectomy had hemorrhagic transformation of her stroke. At this  point, there is a definite contraindication to anticoagulation.  We'll continue to observe her and look for any further evidence of atrial fibrillation. We will need clearance from neurology or  neurosurgery if we ever do find atrial fibrillation and want to start anticoagulation.  We'll get an echocardiogram  2. History of congestive heart failure: She had an echocardiogram the past which reveals normal left ventricle systolic function.  She has mild aortic insufficiency and mild mitral regurgitation. She doesn't have any evidence of congestive heart failure at present.     I have spent a total of 40 minutes with patient reviewing hospital  notes , telemetry, EKGs, labs and examining patient as well as establishing an assessment and plan that was discussed with the patient. > 50% of time was spent in direct patient care.    Thayer Headings, Brooke Bonito., MD, [facility] 12/01/2016, 6:58 PM [address],  [place] Pager [phone]

## 2016-12-01 NOTE — Evaluation (Signed)
Occupational Therapy Evaluation Patient Details Name: Valerie Mcclure MRN: [id number] DOB: 1941-07-12 Today's Date: 12/01/2016    History of Present Illness Pt is a 76 y.o. female with history of chronic low back pain, HTN, HLD, hypothyroidism presenting with aphasia and right hemiplegia on awakening. She did not receive IV t-PA due to delay in arrival but was taken to interventional neuroradiology was she received  TICI3 revascularization of the occluded left MCA with mechanical thrombectomy. Patient with post IR hemorrhage. MRI showed moderate sized L MCA infarct with basal ganglia hematoma with slight intraventricular extension and cytotoxic edema with slight 7 mm of midline shift. She was intubated 5/13-5/14.    Clinical Impression   Pt admitted with above. She demonstrates the below listed deficits and will benefit from continued OT to maximize safety and independence with BADLs.  Pt presents to OT with Rt hemiparesis, Rt inattention, Motor apraxia, impaired balance, and impaired communication.  She requires mod A + 2 for functional mobility and mod - max A for ADLs.  She was independent PTA.  Recommend CIR.  WIll follow.       Follow Up Recommendations  CIR;Supervision/Assistance - 24 hour    Equipment Recommendations  3 in 1 bedside commode;Tub/shower bench    Recommendations for Other Services Rehab consult     Precautions / Restrictions Precautions Precautions: Fall      Mobility Bed Mobility Overal bed mobility: Needs Assistance Bed Mobility: Supine to Sit     Supine to sit: Mod assist;+2 for physical assistance     General bed mobility comments: scoot to EOB with max for bridging, up via L elbow and scoot to EOB with mod, 2 person for safety..  At EOB, pt initially unsteady with R LE tending to extend and pt listing off to the Right.  First noted apraxias with pt having difficulty executing tasks ie bridges, scoots,  Transfers Overall transfer level: Needs  assistance Equipment used: 2 person hand held assist Transfers: Sit to/from Stand Sit to Stand: Mod assist;+2 physical assistance         General transfer comment: Cues for hand placement, assist to come both forward and up.  Some stability assist at trunk and stability at R knee    Balance Overall balance assessment: Needs assistance Sitting-balance support: Single extremity supported;Feet supported Sitting balance-Leahy Scale: Poor Sitting balance - Comments: Tended to list R with R LE extending in response to her balance reactions   Standing balance support: Bilateral upper extremity supported;During functional activity Standing balance-Leahy Scale: Poor Standing balance comment: pt having difficulty w/shifting L and tended to list R needing truncal and R LE support.                           ADL either performed or assessed with clinical judgement   ADL Overall ADL's : Needs assistance/impaired Eating/Feeding: NPO   Grooming: Wash/dry hands;Wash/dry face;Oral care;Brushing hair;Total assistance;Sitting Grooming Details (indicate cue type and reason): when cued, pt will move comb to head, but was too fatigued to complete activity  Upper Body Bathing: Moderate assistance;Sitting   Lower Body Bathing: Maximal assistance;Sit to/from stand   Upper Body Dressing : Maximal assistance;Sitting   Lower Body Dressing: Total assistance;Sit to/from stand   Toilet Transfer: Moderate assistance;+2 for physical assistance;Stand-pivot;BSC   Toileting- Clothing Manipulation and Hygiene: Total assistance;Sit to/from stand       Functional mobility during ADLs: Moderate assistance;+2 for physical assistance General ADL Comments: Pt limited  by Rt hemiparesis, impaired balance, Rt inattention, and apraxia      Vision Patient Visual Report: No change from baseline Additional Comments: Attempted visual assessment at end of session, but pt fatigued and unable to follow commands  to participate      Perception Perception Perception Tested?: Yes Perception Deficits: Inattention/neglect Inattention/Neglect: Does not attend to right side of body Spatial deficits: when asked to attempt to move Rt UE, pt only moves Lt UE even after prompted to move [place] tested?: Deficits Deficits: Ideomotor    Pertinent Vitals/Pain Pain Assessment: Faces Faces Pain Scale: No hurt     Hand Dominance Right   Extremity/Trunk Assessment Upper Extremity Assessment Upper Extremity Assessment: RUE deficits/detail RUE Deficits / Details: PROM WFL.  She is able to initiate small excursion active flex/ext elbow and hand i RUE Sensation: decreased light touch RUE Coordination: decreased fine motor;decreased gross motor   Lower Extremity Assessment Lower Extremity Assessment: Defer to PT evaluation RLE Deficits / Details: gross strength in extension >=3/5, but uncoordinated movement and pt unable to execute to verbal command only.  Active movement at df/pf, gross flexion 3-/5, movement in synergy. RLE Coordination: decreased fine motor LLE Deficits / Details: WFL, but difficult to fully assess due to not following verbal cues well.  pt will mimick movement       Communication Communication Communication: Receptive difficulties;Expressive difficulties   Cognition Arousal/Alertness: Awake/alert Behavior During Therapy: Flat affect Overall Cognitive Status: Difficult to assess                                 General Comments: Pt follows one step commands consistently    General Comments  Noted motor apraxias.  Pt appeared to understand the command, but unable to execute.    Exercises Exercises:  (warm up ROM exercise.)   Shoulder Instructions      Home Living Family/patient expects to be discharged to:: Private residence Living Arrangements: Spouse/significant other;Children Available Help at Discharge: Family Type of Home: House        Home Layout: One level     Bathroom Shower/Tub: Tub/shower unit         Home Equipment:  (TBA)   Additional Comments: pt able to answer via yes/no questions inconsistently.  No family present during eval   Lives With: Spouse;Daughter    Prior Functioning/Environment Level of Independence: Independent        Comments: Pt indicates she drives and was fully independent.  Family not available to confirm         OT Problem List: Decreased strength;Decreased activity tolerance;Decreased range of motion;Impaired balance (sitting and/or standing);Impaired vision/perception;Decreased coordination;Decreased cognition;Decreased safety awareness;Decreased knowledge of use of DME or AE;Impaired sensation;Impaired tone;Impaired UE functional use      OT Treatment/Interventions: Self-care/ADL training;Neuromuscular education;DME and/or AE instruction;Cognitive remediation/compensation;Visual/perceptual remediation/compensation;Patient/family education;Balance training;Splinting;Therapeutic activities    OT Goals(Current goals can be found in the care plan section) Acute Rehab OT Goals Patient Stated Goal: Pt unable to state  OT Goal Formulation: With patient Time For Goal Achievement: 12/15/16 Potential to Achieve Goals: Good ADL Goals Pt Will Perform Grooming: with min assist;standing Pt Will Perform Upper Body Bathing: with supervision;with set-up;sitting Pt Will Perform Lower Body Bathing: with min assist;sit to/from stand Pt Will Transfer to Toilet: with min assist;ambulating;regular height toilet;bedside commode;grab bars Pt Will Perform Toileting - Clothing Manipulation and hygiene: with min assist;sit to/from stand Additional ADL  Goal #1: Pt will locate ADL items on Rt with no cues  Additional ADL Goal #2: Pt will use Rt UE as a support during ADLs   OT Frequency: Min 2X/week   Barriers to D/C:            Co-evaluation              AM-PAC PT "6 Clicks" Daily Activity      Outcome Measure Help from another person eating meals?: Total Help from another person taking care of personal grooming?: Total Help from another person toileting, which includes using toliet, bedpan, or urinal?: A Lot Help from another person bathing (including washing, rinsing, drying)?: A Lot Help from another person to put on and taking off regular upper body clothing?: Total Help from another person to put on and taking off regular lower body clothing?: Total 6 Click Score: 8   End of Session Equipment Utilized During Treatment: Oxygen Nurse Communication: Mobility status  Activity Tolerance: Patient tolerated treatment well Patient left: in chair;with chair alarm set  OT Visit Diagnosis: Hemiplegia and hemiparesis Hemiplegia - Right/Left: Right Hemiplegia - dominant/non-dominant: Dominant Hemiplegia - caused by: Cerebral infarction                Time: 4492-0100 OT Time Calculation (min): 33 min Charges:  OT General Charges $OT Visit: 1 Procedure G-Codes:     Lucille Passy, OTR/L [phone]   Lucille Passy M 12/01/2016, 2:54 PM

## 2016-12-01 NOTE — Progress Notes (Signed)
Referring Physician(s): Dr Orlena Sheldon  Supervising Physician: Luanne Bras  Patient Status:  [facility] - In-pt  Chief Complaint:  CVA L MCA revascularization 5/13  Subjective:  Sitting up in bed Trying to communicate Still with mixed aphasia Dysarthria Comfortable Family in room  Allergies: Shrimp [shellfish allergy]; Tandem plus [fefum-fepo-fa-b [id number]]; Ivp dye [iodinated diagnostic agents]; and Penicillins  Medications: Prior to Admission medications   Medication Sig Start Date End Date Taking? Authorizing Provider  estradiol (ESTRACE) 0.5 MG tablet TAKE 1 TABLET(0.5 MG) BY MOUTH DAILY 05/20/16  Yes Burns, Claudina Lick, MD  FLUoxetine (PROZAC) 20 MG capsule Take 1 capsule (20 mg total) by mouth daily. Patient taking differently: Take 40 mg by mouth daily.  04/27/14  Yes Rowe Clack, MD  furosemide (LASIX) 20 MG tablet TAKE 1 TABLET BY MOUTH DAILY AS NEEDED 07/29/16  Yes Burns, Claudina Lick, MD  levothyroxine (SYNTHROID, LEVOTHROID) 75 MCG tablet Take 75 mcg by mouth daily before breakfast.   Yes [provider]  metoprolol tartrate (LOPRESSOR) 25 MG tablet TAKE 1 TABLET(25 MG) BY MOUTH TWICE DAILY 05/20/16  Yes Burns, Claudina Lick, MD  morphine (MS CONTIN) 15 MG 12 hr tablet Take 15 mg by mouth every 12 (twelve) hours.  04/01/16  Yes [provider]  NIFEdipine (PROCARDIA-XL/ADALAT-CC/NIFEDICAL-XL) 30 MG 24 hr tablet Take 30 mg by mouth daily.   Yes [provider]  nortriptyline (PAMELOR) 10 MG capsule TAKE 2 CAPSULES BY MOUTH AT BEDTIME 06/08/16  Yes Burns, Claudina Lick, MD  simvastatin (ZOCOR) 10 MG tablet TAKE 1 TABLET(10 MG) BY MOUTH DAILY 10/14/16  Yes Burns, Claudina Lick, MD  valsartan (DIOVAN) 160 MG tablet Take 1 tablet (160 mg total) by mouth daily. 12/25/15  Yes Burns, Claudina Lick, MD  vitamin B-12 (CYANOCOBALAMIN) 100 MCG tablet Take 100 mcg by mouth daily.   Yes [provider]  Calcium Carbonate-Vit D-Min (CALCIUM 1200 PO) Take by mouth  daily.      [provider]  Cholecalciferol (VITAMIN D3) 1000 UNITS CAPS Take by mouth daily.      [provider]  levothyroxine (SYNTHROID, LEVOTHROID) 88 MCG tablet Take 1 tablet (88 mcg total) by mouth daily. 12/30/15   [name], MD     Vital Signs: BP (!) 142/66   Pulse 80   Temp 98.1 F (36.7 C) (Oral)   Resp 18   Ht 5\' 3"  (1.6 m)   Wt 175 lb 4.3 oz (79.5 kg)   SpO2 97%   BMI 31.05 kg/m   Physical Exam  Constitutional: She appears well-nourished.  HENT:  Head: Atraumatic.  Cardiovascular: Normal rate.   Pulmonary/Chest: Effort normal and breath sounds normal.  Abdominal: Soft. Bowel sounds are normal.  Musculoskeletal:  tries to follow verbal commands Not always correct with efforts   Neurological: She is alert.  Skin: Skin is warm and dry.  Rt groin site is clean and dry NT no bleeding No hematoma  Nursing note and vitals reviewed.   Imaging: Ct Angio Head W Or Wo Contrast  Result Date: 11/29/2016 CLINICAL DATA:  Right facial droop and slurred speech. Last seen normal 10 p.m. last night. Stroke. EXAM: CT ANGIOGRAPHY HEAD AND NECK CT PERFUSION BRAIN TECHNIQUE: Multidetector CT imaging of the head and neck was performed using the standard protocol during bolus administration of intravenous contrast. Multiplanar CT image reconstructions and MIPs were obtained to evaluate the vascular anatomy. Carotid stenosis measurements (when applicable) are obtained utilizing NASCET criteria, using the distal internal  carotid diameter as the denominator. Multiphase CT imaging of the brain was performed following IV bolus contrast injection. Subsequent parametric perfusion maps were calculated using RAPID software. CONTRAST:  80 mL Isovue 370 IV COMPARISON:  CT head 11/29/2016 FINDINGS: CTA NECK FINDINGS Aortic arch: Mild atherosclerotic disease in the aortic arch. Proximal great vessels widely patent. Right carotid system: Mild atherosclerotic disease at the  right carotid bifurcation. No significant carotid stenosis. Left carotid system: Noncalcified plaque along the medial wall of the left internal carotid artery with mild irregularity but no definite ulceration. Less than 25% diameter stenosis of the left internal carotid artery. Left external carotid artery patent. Vertebral arteries: Both vertebral arteries patent to the basilar without significant stenosis. Skeleton: Moderate cervical spine degenerative change. No acute skeletal abnormality. Other neck: 10 mm rim calcified nodule in the right lobe of the thyroid, with a benign appearance. Smaller partially calcified nodule in the right upper lobe of the thyroid also likely benign. Upper chest: Mild mosaic pattern of lung density in the right lung apex. Question mild edema. Review of the MIP images confirms the above findings CTA HEAD FINDINGS Anterior circulation: Mild atherosclerotic disease in the cavernous carotid bilaterally which is calcified but non stenotic. Left M1 segment occluded. This is hyperdense on CT and appears acute. There is perfusion of the left MCA branches due to collaterals. Posterior circulation: Both vertebral arteries patent to the basilar. PICA patent bilaterally. Basilar patent. Superior cerebellar and posterior cerebral arteries patent bilaterally without stenosis. Venous sinuses: Patent Anatomic variants: None Delayed phase: Not performed Review of the MIP images confirms the above findings CT Brain Perfusion Findings: CBF (<30%) Volume: 5 mLmL. However, review of the head CT of earlier today, there is hypodensity left temporal lobe compatible with acute infarct which is larger than indicated on CT perfusion. Perfusion (Tmax>6.0s) volume: 52 mLmL Perfusion (T-max greater than 4 seconds) volume:  99 mL Mismatch Volume: 47 mL.ML. Review of the perfusion images reveals significant penumbra greater than the calculated amount involving the left temporal parietal lobe. Infarction  Location:Left temporoparietal lobe IMPRESSION: Left M1 occlusion due to acute thrombus or embolus. Hypodensity left lower temporal lobe compatible with acute infarct. There is significant penumbra in the left temporoparietal lobe. Irregular noncalcified plaque left internal carotid artery could be a source of emboli or not the high non stenotic atherosclerotic disease. No significant stenosis in the carotid or vertebral arteries in the neck. Atherosclerotic calcification in the cavernous carotid bilaterally. These results were called by telephone at the time of interpretation on 11/29/2016 at 10:50 Am to Dr. Rogue Jury , who verbally acknowledged these results. Electronically Signed   By: Franchot Gallo M.D.   On: 11/29/2016 11:11   Dg Chest 2 View  Result Date: 11/29/2016 CLINICAL DATA:  Code stroke, slurred speech, right-sided droop. EXAM: CHEST  2 VIEW COMPARISON:  Chest x-ray dated 01/01/2009. FINDINGS: Stable cardiomegaly. Lungs are clear. No pleural effusion or pneumothorax seen. Atherosclerotic changes noted at the aortic arch. No acute or suspicious osseous finding. IMPRESSION: 1. No active cardiopulmonary disease. No evidence of pneumonia or pulmonary edema. 2. Stable cardiomegaly. 3. Aortic atherosclerosis. Electronically Signed   By: Franki Cabot M.D.   On: 11/29/2016 09:27   Ct Head Wo Contrast  Result Date: 11/29/2016 CLINICAL DATA:  Stroke post thrombectomy EXAM: CT HEAD WITHOUT CONTRAST TECHNIQUE: Contiguous axial images were obtained from the base of the skull through the vertex without intravenous contrast. COMPARISON:  CT head 11/29/2016 FINDINGS: Brain: High-density within the  left putamen and left caudate compatible with acute hemorrhage. There is also high density in the superior basal ganglia compatible with extravasation of contrast. The high density area is most likely a mixture of blood and contrast. This area measures approximately 6.1 x 3.5 x 3.7 cm, acute blood volume 39 mL.  Hemorrhagic transformation of left temporal lobe infarct. Compression of the left lateral ventricle due to mass-effect. 6 mm midline shift to the right. Interval development of subdural hemorrhage along the tentorium bilaterally. Vascular: Normal arterial and venous enhancement post angiography. Skull: Negative for fracture. Sinuses/Orbits: Mucosal edema in the paranasal sinuses. Normal orbit. Other: None IMPRESSION: Large area of hemorrhage and extravasated contrast in the left basal ganglia, blood volume 39 mL. Hemorrhagic transformation of left temporal infarct. Mild amount of subdural hematoma along the tentorium bilaterally. 6 mm midline shift to the right. These results were called by telephone at the time of interpretation on 11/29/2016 at 2:20 pm to Dr. Luanne Bras , who verbally acknowledged these results. Electronically Signed   By: Franchot Gallo M.D.   On: 11/29/2016 14:21   Ct Head Wo Contrast  Result Date: 11/29/2016 CLINICAL DATA:  Left facial droop.  Stroke symptoms EXAM: CT HEAD WITHOUT CONTRAST TECHNIQUE: Contiguous axial images were obtained from the base of the skull through the vertex without intravenous contrast. COMPARISON:  None. FINDINGS: Brain: Ill-defined hypodensity in the left inferior temporal lobe, suspicious for acute infarct. Negative for acute hemorrhage. Ventricle size normal. No shift of the midline structures. Vascular: Hyperdense left MCA compatible with thrombus. This correlates with the left temporal lobe infarction. Skull: Negative Sinuses/Orbits: Negative Other: None IMPRESSION: Hypodensity left temporal lobe consistent with acute infarct. Hyperdense left MCA compatible with thrombus or embolus. Aspects score 8 These results were called by telephone at the time of interpretation on 11/29/2016 at 9:14 am to Dr. Veryl Speak , who verbally acknowledged these results. Electronically Signed   By: Franchot Gallo M.D.   On: 11/29/2016 09:17   Ct Angio Neck W Or Wo  Contrast  Result Date: 11/29/2016 CLINICAL DATA:  Right facial droop and slurred speech. Last seen normal 10 p.m. last night. Stroke. EXAM: CT ANGIOGRAPHY HEAD AND NECK CT PERFUSION BRAIN TECHNIQUE: Multidetector CT imaging of the head and neck was performed using the standard protocol during bolus administration of intravenous contrast. Multiplanar CT image reconstructions and MIPs were obtained to evaluate the vascular anatomy. Carotid stenosis measurements (when applicable) are obtained utilizing NASCET criteria, using the distal internal carotid diameter as the denominator. Multiphase CT imaging of the brain was performed following IV bolus contrast injection. Subsequent parametric perfusion maps were calculated using RAPID software. CONTRAST:  80 mL Isovue 370 IV COMPARISON:  CT head 11/29/2016 FINDINGS: CTA NECK FINDINGS Aortic arch: Mild atherosclerotic disease in the aortic arch. Proximal great vessels widely patent. Right carotid system: Mild atherosclerotic disease at the right carotid bifurcation. No significant carotid stenosis. Left carotid system: Noncalcified plaque along the medial wall of the left internal carotid artery with mild irregularity but no definite ulceration. Less than 25% diameter stenosis of the left internal carotid artery. Left external carotid artery patent. Vertebral arteries: Both vertebral arteries patent to the basilar without significant stenosis. Skeleton: Moderate cervical spine degenerative change. No acute skeletal abnormality. Other neck: 10 mm rim calcified nodule in the right lobe of the thyroid, with a benign appearance. Smaller partially calcified nodule in the right upper lobe of the thyroid also likely benign. Upper chest: Mild mosaic pattern of lung  density in the right lung apex. Question mild edema. Review of the MIP images confirms the above findings CTA HEAD FINDINGS Anterior circulation: Mild atherosclerotic disease in the cavernous carotid bilaterally which  is calcified but non stenotic. Left M1 segment occluded. This is hyperdense on CT and appears acute. There is perfusion of the left MCA branches due to collaterals. Posterior circulation: Both vertebral arteries patent to the basilar. PICA patent bilaterally. Basilar patent. Superior cerebellar and posterior cerebral arteries patent bilaterally without stenosis. Venous sinuses: Patent Anatomic variants: None Delayed phase: Not performed Review of the MIP images confirms the above findings CT Brain Perfusion Findings: CBF (<30%) Volume: 5 mLmL. However, review of the head CT of earlier today, there is hypodensity left temporal lobe compatible with acute infarct which is larger than indicated on CT perfusion. Perfusion (Tmax>6.0s) volume: 52 mLmL Perfusion (T-max greater than 4 seconds) volume:  99 mL Mismatch Volume: 47 mL.ML. Review of the perfusion images reveals significant penumbra greater than the calculated amount involving the left temporal parietal lobe. 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 Hypodensity left lower temporal lobe compatible with acute infarct. There is significant penumbra in the left temporoparietal lobe. Irregular noncalcified plaque left internal carotid artery could be a source of emboli or not the high non stenotic atherosclerotic disease. No significant stenosis in the carotid or vertebral arteries in the neck. Atherosclerotic calcification in the cavernous carotid bilaterally. These results were called by telephone at the time of interpretation on 11/29/2016 at 10:50 Am to Dr. Rogue Jury , who verbally acknowledged these results. Electronically Signed   By: Franchot Gallo M.D.   On: 11/29/2016 11:11   Mr Brain Wo Contrast  Result Date: 11/30/2016 CLINICAL DATA:  76 year old female who awoke with aphasia and right facial droop. Left M1 occlusion status post neuro endovascular revascularization. Subsequent  large left hemisphere hemorrhage. EXAM: MRI HEAD WITHOUT CONTRAST TECHNIQUE: Multiplanar, multiecho pulse sequences of the brain and surrounding structures were obtained without intravenous contrast. COMPARISON:  Post endovascular Head CT 11/29/2016, and earlier. FINDINGS: Brain: Cortical and subcortical white matter restricted diffusion along the anterior left temporal tip and tracking posteriorly along the left superior and middle temporal gyri (Series 5, image 19). Superimposed intracranial hemorrhage centered at the left basal ganglia with T2 and T1 heterogeneous blood products encompassing 55 x 30 x 31 mm (AP by transverse by CC) for an estimated blood volume of 26 mL. Associated diffusion susceptibility in the area of the hemorrhage, but there also seems to be some marginal restricted diffusion. Small volume of intraventricular hemorrhage layering in the occipital horns. Effaced left lateral ventricle. No ventriculomegaly. Edema surrounding the area of hemorrhage, and cytotoxic edema in the left temporal lobe. Rightward midline shift of 7 mm. Patent basilar cisterns. There are occasional small foci of restricted diffusion in the left PCA territory including the dorsal left thalamus and anterior left occipital lobe. No posterior fossa or contralateral right hemisphere diffusion restriction. Patchy nonspecific T2 hyperintensity in the pons. No cortical encephalomalacia. No chronic cerebral blood products. Negative pituitary and cervicomedullary junction. Vascular: Major intracranial vascular flow voids are preserved. Skull and upper cervical spine: Negative. Sinuses/Orbits: Normal orbits soft tissues. Visualized paranasal sinuses and mastoids are stable and well pneumatized. Other: Layering fluid in the pharynx.  Negative scalp soft tissues. IMPRESSION: 1. Left MCA infarct with the most confluent diffusion restriction in the left temporal lobe. Superimposed 5.5 cm intra-axial hemorrhage centered at the left  basal  ganglia with intra-axial blood volume estimated 26 mL. Associated left hemisphere edema. 2. Rightward midline shift of 7 mm.  Basilar cisterns remain patent. 3. Effaced left lateral ventricle and trace intraventricular hemorrhage without ventriculomegaly. 4. Occasional small foci of restricted diffusion also at the left PCA territory. Electronically Signed   By: Genevie Ann M.D.   On: 11/30/2016 13:59   Ct Cerebral Perfusion W Contrast  Result Date: 11/29/2016 CLINICAL DATA:  Right facial droop and slurred speech. Last seen normal 10 p.m. last night. Stroke. EXAM: CT ANGIOGRAPHY HEAD AND NECK CT PERFUSION BRAIN TECHNIQUE: Multidetector CT imaging of the head and neck was performed using the standard protocol during bolus administration of intravenous contrast. Multiplanar CT image reconstructions and MIPs were obtained to evaluate the vascular anatomy. Carotid stenosis measurements (when applicable) are obtained utilizing NASCET criteria, using the distal internal carotid diameter as the denominator. Multiphase CT imaging of the brain was performed following IV bolus contrast injection. Subsequent parametric perfusion maps were calculated using RAPID software. CONTRAST:  80 mL Isovue 370 IV COMPARISON:  CT head 11/29/2016 FINDINGS: CTA NECK FINDINGS Aortic arch: Mild atherosclerotic disease in the aortic arch. Proximal great vessels widely patent. Right carotid system: Mild atherosclerotic disease at the right carotid bifurcation. No significant carotid stenosis. Left carotid system: Noncalcified plaque along the medial wall of the left internal carotid artery with mild irregularity but no definite ulceration. Less than 25% diameter stenosis of the left internal carotid artery. Left external carotid artery patent. Vertebral arteries: Both vertebral arteries patent to the basilar without significant stenosis. Skeleton: Moderate cervical spine degenerative change. No acute skeletal abnormality. Other neck: 10 mm  rim calcified nodule in the right lobe of the thyroid, with a benign appearance. Smaller partially calcified nodule in the right upper lobe of the thyroid also likely benign. Upper chest: Mild mosaic pattern of lung density in the right lung apex. Question mild edema. Review of the MIP images confirms the above findings CTA HEAD FINDINGS Anterior circulation: Mild atherosclerotic disease in the cavernous carotid bilaterally which is calcified but non stenotic. Left M1 segment occluded. This is hyperdense on CT and appears acute. There is perfusion of the left MCA branches due to collaterals. Posterior circulation: Both vertebral arteries patent to the basilar. PICA patent bilaterally. Basilar patent. Superior cerebellar and posterior cerebral arteries patent bilaterally without stenosis. Venous sinuses: Patent Anatomic variants: None Delayed phase: Not performed Review of the MIP images confirms the above findings CT Brain Perfusion Findings: CBF (<30%) Volume: 5 mLmL. However, review of the head CT of earlier today, there is hypodensity left temporal lobe compatible with acute infarct which is larger than indicated on CT perfusion. Perfusion (Tmax>6.0s) volume: 52 mLmL Perfusion (T-max greater than 4 seconds) volume:  99 mL Mismatch Volume: 47 mL.ML. Review of the perfusion images reveals significant penumbra greater than the calculated amount involving the left temporal parietal lobe. 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 Hypodensity left lower temporal lobe compatible with acute infarct. There is significant penumbra in the left temporoparietal lobe. Irregular noncalcified plaque left internal carotid artery could be a source of emboli or not the high non stenotic atherosclerotic disease. No significant stenosis in the carotid or vertebral arteries in the neck. Atherosclerotic calcification in the cavernous carotid bilaterally. These results  were called by telephone at the time of interpretation on 11/29/2016 at 10:50 Am to Dr. Rogue Jury , who verbally acknowledged these results. Electronically Signed  By: Franchot Gallo M.D.   On: 11/29/2016 11:11   Dg Chest Port 1 View  Result Date: 12/01/2016 CLINICAL DATA:  Stroke, history of hypertension, extubation of the trachea and esophagus. EXAM: PORTABLE CHEST 1 VIEW COMPARISON:  Portable chest x-ray of Nov 29, 2016. FINDINGS: The lungs are adequately inflated. There is no focal infiltrate. The retrocardiac lung markings on the left remain mildly prominent. There is no significant pleural effusion and there is no pneumothorax. The heart is top-normal in size. The pulmonary vascularity is normal. There is calcification in the wall of the thoracic aorta. IMPRESSION: Good inflation of both lungs since extubation. Minimal subsegmental atelectasis at the left lung base is suspected. No CHF. Thoracic aortic atherosclerosis. Electronically Signed   By: David  Martinique M.D.   On: 12/01/2016 07:27   Dg Chest Port 1 View  Result Date: 11/30/2016 CLINICAL DATA:  Confirm orogastric tube placement. EXAM: PORTABLE CHEST 1 VIEW COMPARISON:  Chest radiograph earlier this day at 0907 hour FINDINGS: Endotracheal tube is 2.3 cm from the carina. Enteric tube in place, tip and side-port below the diaphragm. Low lung volumes. Borderline cardiomegaly. No pulmonary edema. No large pleural effusion. No pneumothorax. IMPRESSION: Endotracheal tube approximately 2.3 cm from the carina. Enteric tube in place. Borderline cardiomegaly.  No new abnormality. Electronically Signed   By: Jeb Levering M.D.   On: 11/30/2016 00:28   Dg Abd Portable 1v  Result Date: 11/30/2016 CLINICAL DATA:  76 y/o  F; enteric tube placement. EXAM: PORTABLE ABDOMEN - 1 VIEW COMPARISON:  None. FINDINGS: Nonobstructive bowel gas pattern. Enteric tube tip projects over the stomach. Advanced lumbar levocurvature and lower lumbar fusion changes.  IMPRESSION: Enteric tube tip projects over the mid stomach. Electronically Signed   By: Kristine Garbe M.D.   On: 11/30/2016 00:26   Ir Percutaneous Art Thrombectomy/infusion Intracranial Inc Diag Angio  Result Date: 11/30/2016 INDICATION: Patient presenting with aphasia and right-sided weakness with left gaze preference. 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 CBF < 30 % 5 ml,Tmax > 6 secs of 52 ml with a mismatch vol of 45ml. EXAM: 1. EMERGENT LARGE VESSEL OCCLUSION THROMBOLYSIS (anterior CIRCULATION) 2. COMPARISON:  CT angiogram of the head and neck, and CT perfusion study of 11/29/2016. MEDICATIONS: Vancomycin 1.5 mg IV was administered within 1 hour of the procedure. ANESTHESIA/SEDATION: General anesthesia. CONTRAST:  Isovue 300 approximately 75 cc. FLUOROSCOPY TIME:  Fluoroscopy Time: 28 minutes 36 seconds (1539 mGy). COMPLICATIONS: None immediate. TECHNIQUE: Following a full explanation of the procedure along with the potential associated complications, an informed witnessed consent was obtained. The risks of intracranial hemorrhage of 10%, worsening neurological deficit, ventilator dependency, death and inability to revascularize were all reviewed in detail with the patient's . The patient was then put under general anesthesia by the Department of Anesthesiology at [facility]. The right groin was prepped and draped in the usual sterile fashion. Thereafter using modified Seldinger technique, transfemoral access into the right common femoral artery was obtained without difficulty. Over a 0.035 inch guidewire a 5 French Pinnacle sheath was inserted. Through this, and also over a 0.035 inch guidewire a 5 [place] JB 1 catheter was advanced to the aortic arch region and selectively positioned in the right common carotid artery and the left common carotid artery, and the innominate artery.  FINDINGS: The right subclavian arteriogram demonstrates the right vertebral artery to opacify normally to  the cranial skull base. Flow is noted into the right vertebrobasilar junction and the right posterior-inferior cerebellar artery. Flash opacification of the proximal basilar artery is also noted. The right common carotid arteriogram demonstrates the right external carotid artery and its major branches to be widely patent. The right internal carotid artery at the bulb to the cranial skull base opacifies widely. The petrous, the cavernous and the supraclinoid segments are widely patent. The right middle cerebral artery and the right anterior cerebral artery opacify into the capillary and venous phases. Flash opacification via the anterior communicating artery of the left anterior cerebral artery A2 segment is also noted. Also noted is contrast blush in the region of the middle terminate of the right maxillary sinus most consistent with inflammatory reaction. The left common carotid arteriogram demonstrates the left external carotid artery and its major branches to be widely patent. The left internal carotid artery at the bulb to the cranial skull base opacifies widely. There are mild FMD-like changes noted in the mid left internal carotid artery mid cervical segment. The petrous, the cavernous and the supraclinoid segments demonstrate wide patency. A left posterior communicating artery is seen opacifying the left posterior cerebral artery distribution. The left anterior cerebral artery opacifies into the capillary and venous phases with flash filling via the anterior communicating artery of the right anterior cerebral artery A2 and A1 segments. The left middle cerebral artery demonstrates complete angiographic occlusion in its mid M1 segment with delayed opacification of an anterior temporal branch with prominent filling defects proximally. The M1 segment, otherwise, remains completely occluded. The delayed  arterial phase in the lateral projection demonstrates partial retrograde opacification of the distal perisylvian branches with large area of hypoperfusion involving the lentiform nucleus and the caudate head. PROCEDURE: ENDOVASCULAR COMPLETE REVASCULARIZATION OF OCCLUDED LEFT MIDDLE CEREBRAL ARTERY M1 SEGMENT The diagnostic JB 1 catheter in the left common carotid artery was then exchanged over a 0.035 inch 300 cm Rosen exchange guidewire for an 8 [place] 55 cm Brite tip neurovascular sheath using biplane roadmap technique and constant fluoroscopic guidance. Good aspiration was obtained from the side port of the neurovascular sheath. This was then connected to continuous heparinized saline infusion. Over the [employer] guidewire, an 8 [place] 85 cm FlowGate balloon guide catheter which had been prepped with 50% contrast and 50% heparinized saline infusion was advanced and positioned in the distal left common carotid artery. The guidewire was removed. Good aspiration was obtained from the hub of the 8 [place] FlowGate guide catheter. A gentle contrast injection demonstrated no evidence of spasms, dissections or of intraluminal filling defects. At this time, using biplane roadmap technique and constant fluoroscopic guidance, over a 0.035 inch Roadrunner guidewire, the 8 [place] FlowGate guide catheter was then advanced to the distal cervical left ICA without difficulty. Good aspiration was obtained after removal of the wire. A gentle control arteriogram demonstrated mild spasm with no impediment of distal flow. Patient was treated with 1 aliquots of 25 mics of nitroglycerin with relief of the vasospasm. At this time, in a coaxial manner and with constant heparinized saline infusion using biplane roadmap technique and constant fluoroscopic guidance, a Trevo ProVue 021 microcatheter was advanced over a 0.014 inch Softip Synchro micro guidewire to the distal end of the FlowGate guide catheter. With the micro guidewire  leading with a J-tip configuration the combination was navigated to the supraclinoid right ICA. A torque device was then used to manipulate the guidewire into the left middle cerebral artery followed by the  microcatheter. The micro guidewire was then advanced without difficulty into the M2 M3 region of the inferior division of left middle cerebral artery followed by the microcatheter. The guidewire was removed. Good aspiration was obtained from the hub of the microcatheter. A gentle contrast injection demonstrated antegrade flow of contrast. At this time, a 4 mm x 40 mm Solitaire FR retrieval device was advanced in a coaxial manner and with constant heparinized saline infusion using biplane roadmap technique and constant fluoroscopic guidance to the distal portion of the microcatheter. At this time the entire system was straightened by loosening the O ring on the delivery microcatheter. After having ascertained the distal and the proximal positioning of the retrieval device, with slight forward gentle traction with the right hand on the delivery micro guidewire, with the left hand the microcatheter was retrieved unsheathing the entire Solitaire FR retrieval device with the tip of the microcatheter just proximal to the proximal landing zone of the retrieval device. A control arteriogram performed through the 8 [place] FlowGate guide catheter demonstrated significantly improved caliber and flow through the middle cerebral artery distribution with a narrowing of the retrieval device in the distal M1 segment. At this time, the balloon of the [facility] guide catheter was inflated in the distal left internal carotid artery for proximal flow arrest. The proximal portion of the retrieval device was captured into the microcatheter. There on after whilst aspirating with a 60 mL syringe at the hub of the [facility] guide catheter, the combination of the retrieval device and the microcatheter were gently retrieved and removed. The  aspirate demonstrated prominent clot measuring approximately 2 mm x 4 mm. Aspiration was continued as the balloon was deflated in the left internal carotid artery. The FlowGate guide catheter was gently retrieved more proximally after deflation of the balloon. A control arteriogram performed through the 8 [place] FlowGate guide catheter demonstrated complete angiographic revascularization of the left occluded left middle cerebral artery with patency of the left posterior cerebral and left anterior cerebral arteries. Focal segmental spasm was noted of the left middle cerebral artery involving the inferior division in the distal M1 region. This responded to 4 aliquots of 25 mics of nitroglycerin intra-arterially. A final control arteriogram performed demonstrated significant improved caliber of the previously noted vasospasm of the left middle cerebral artery, and also the left internal carotid artery mid cervical segment. Also noted of the final to control arteriograms a focal area of hyper density in the left centrum semiovale region. No evidence of angiographic extravasation or mass effect was seen. Patient's hemodynamic status, blood pressure and heart rate remained stable throughout the procedure. The 8 [place] FlowGate guide catheter and the 8 [place] neurovascular sheath were then retrieved into the abdominal aorta and exchanged over a J-tip guidewire for an 8 French Pinnacle sheath which in turn was successfully removed with the application of a closure device. The right groin showed no evidence of a hematoma or hemorrhage. The distal pulses in both feet remained Dopplerable unchanged from prior to the procedure. IMPRESSION: Status post endovascular complete revascularization of occluded left middle cerebral artery with 1 pass with the Solitaire FR 4 mm x 40 mm retrieval device, with achievement of a TICI 3 reperfusion. Groin puncture to initial reperfusion TICI2b 32 minutes. Groin puncture to TICI reperfusion 38  minutes PLAN: The patient was transferred to the CT scanner for postprocedural CT scan of the brain. Electronically Signed   By: Luanne Bras M.D.   On: 11/29/2016 14:42   Ir Angio  Intra Extracran Sel Com Carotid Innominate Uni L Mod Sed  Result Date: 11/30/2016 INDICATION: Patient presenting with aphasia and right-sided weakness with left gaze preference. 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 CBF < 30 % 5 ml,Tmax > 6 secs of 52 ml with a mismatch vol of 63ml. EXAM: 1. EMERGENT LARGE VESSEL OCCLUSION THROMBOLYSIS (anterior CIRCULATION) 2. COMPARISON:  CT angiogram of the head and neck, and CT perfusion study of 11/29/2016. MEDICATIONS: Vancomycin 1.5 mg IV was administered within 1 hour of the procedure. ANESTHESIA/SEDATION: General anesthesia. CONTRAST:  Isovue 300 approximately 75 cc. FLUOROSCOPY TIME:  Fluoroscopy Time: 28 minutes 36 seconds (1539 mGy). COMPLICATIONS: None immediate. TECHNIQUE: Following a full explanation of the procedure along with the potential associated complications, an informed witnessed consent was obtained. The risks of intracranial hemorrhage of 10%, worsening neurological deficit, ventilator dependency, death and inability to revascularize were all reviewed in detail with the patient's . The patient was then put under general anesthesia by the Department of Anesthesiology at [facility]. The right groin was prepped and draped in the usual sterile fashion. Thereafter using modified Seldinger technique, transfemoral access into the right common femoral artery was obtained without difficulty. Over a 0.035 inch guidewire a 5 [place] Pinnacle sheath was inserted. Through this, and also over a 0.035 inch guidewire a 5 [place] JB 1 catheter was advanced to the aortic arch region and selectively positioned in the right common carotid artery and the left common carotid artery,  and the innominate artery. FINDINGS: The right subclavian arteriogram demonstrates the right vertebral artery to opacify normally to the cranial skull base. Flow is noted into the right vertebrobasilar junction and the right posterior-inferior cerebellar artery. Flash opacification of the proximal basilar artery is also noted. The right common carotid arteriogram demonstrates the right external carotid artery and its major branches to be widely patent. The right internal carotid artery at the bulb to the cranial skull base opacifies widely. The petrous, the cavernous and the supraclinoid segments are widely patent. The right middle cerebral artery and the right anterior cerebral artery opacify into the capillary and venous phases. Flash opacification via the anterior communicating artery of the left anterior cerebral artery A2 segment is also noted. Also noted is contrast blush in the region of the middle terminate of the right maxillary sinus most consistent with inflammatory reaction. The left common carotid arteriogram demonstrates the left external carotid artery and its major branches to be widely patent. The left internal carotid artery at the bulb to the cranial skull base opacifies widely. There are mild FMD-like changes noted in the mid left internal carotid artery mid cervical segment. The petrous, the cavernous and the supraclinoid segments demonstrate wide patency. A left posterior communicating artery is seen opacifying the left posterior cerebral artery distribution. The left anterior cerebral artery opacifies into the capillary and venous phases with flash filling via the anterior communicating artery of the right anterior cerebral artery A2 and A1 segments. The left middle cerebral artery demonstrates complete angiographic occlusion in its mid M1 segment with delayed opacification of an anterior temporal branch with prominent filling defects proximally. The M1 segment, otherwise, remains completely  occluded. The delayed arterial phase in the lateral projection demonstrates partial retrograde opacification of the distal perisylvian branches with large area of hypoperfusion involving the lentiform nucleus and the caudate head. PROCEDURE: ENDOVASCULAR COMPLETE REVASCULARIZATION OF OCCLUDED LEFT MIDDLE CEREBRAL  ARTERY M1 SEGMENT The diagnostic JB 1 catheter in the left common carotid artery was then exchanged over a 0.035 inch 300 cm Rosen exchange guidewire for an 8 [place] 55 cm Brite tip neurovascular sheath using biplane roadmap technique and constant fluoroscopic guidance. Good aspiration was obtained from the side port of the neurovascular sheath. This was then connected to continuous heparinized saline infusion. Over the [employer] guidewire, an 8 [place] 85 cm FlowGate balloon guide catheter which had been prepped with 50% contrast and 50% heparinized saline infusion was advanced and positioned in the distal left common carotid artery. The guidewire was removed. Good aspiration was obtained from the hub of the 8 [place] FlowGate guide catheter. A gentle contrast injection demonstrated no evidence of spasms, dissections or of intraluminal filling defects. At this time, using biplane roadmap technique and constant fluoroscopic guidance, over a 0.035 inch Roadrunner guidewire, the 8 [place] FlowGate guide catheter was then advanced to the distal cervical left ICA without difficulty. Good aspiration was obtained after removal of the wire. A gentle control arteriogram demonstrated mild spasm with no impediment of distal flow. Patient was treated with 1 aliquots of 25 mics of nitroglycerin with relief of the vasospasm. At this time, in a coaxial manner and with constant heparinized saline infusion using biplane roadmap technique and constant fluoroscopic guidance, a Trevo ProVue 021 microcatheter was advanced over a 0.014 inch Softip Synchro micro guidewire to the distal end of the FlowGate guide catheter. With  the micro guidewire leading with a J-tip configuration the combination was navigated to the supraclinoid right ICA. A torque device was then used to manipulate the guidewire into the left middle cerebral artery followed by the microcatheter. The micro guidewire was then advanced without difficulty into the M2 M3 region of the inferior division of left middle cerebral artery followed by the microcatheter. The guidewire was removed. Good aspiration was obtained from the hub of the microcatheter. A gentle contrast injection demonstrated antegrade flow of contrast. At this time, a 4 mm x 40 mm Solitaire FR retrieval device was advanced in a coaxial manner and with constant heparinized saline infusion using biplane roadmap technique and constant fluoroscopic guidance to the distal portion of the microcatheter. At this time the entire system was straightened by loosening the O ring on the delivery microcatheter. After having ascertained the distal and the proximal positioning of the retrieval device, with slight forward gentle traction with the right hand on the delivery micro guidewire, with the left hand the microcatheter was retrieved unsheathing the entire Solitaire FR retrieval device with the tip of the microcatheter just proximal to the proximal landing zone of the retrieval device. A control arteriogram performed through the 8 [place] FlowGate guide catheter demonstrated significantly improved caliber and flow through the middle cerebral artery distribution with a narrowing of the retrieval device in the distal M1 segment. At this time, the balloon of the [facility] guide catheter was inflated in the distal left internal carotid artery for proximal flow arrest. The proximal portion of the retrieval device was captured into the microcatheter. There on after whilst aspirating with a 60 mL syringe at the hub of the [facility] guide catheter, the combination of the retrieval device and the microcatheter were gently  retrieved and removed. The aspirate demonstrated prominent clot measuring approximately 2 mm x 4 mm. Aspiration was continued as the balloon was deflated in the left internal carotid artery. The FlowGate guide catheter was gently retrieved more proximally after deflation of the balloon. A  control arteriogram performed through the 8 [place] FlowGate guide catheter demonstrated complete angiographic revascularization of the left occluded left middle cerebral artery with patency of the left posterior cerebral and left anterior cerebral arteries. Focal segmental spasm was noted of the left middle cerebral artery involving the inferior division in the distal M1 region. This responded to 4 aliquots of 25 mics of nitroglycerin intra-arterially. A final control arteriogram performed demonstrated significant improved caliber of the previously noted vasospasm of the left middle cerebral artery, and also the left internal carotid artery mid cervical segment. Also noted of the final to control arteriograms a focal area of hyper density in the left centrum semiovale region. No evidence of angiographic extravasation or mass effect was seen. Patient's hemodynamic status, blood pressure and heart rate remained stable throughout the procedure. The 8 [place] FlowGate guide catheter and the 8 [place] neurovascular sheath were then retrieved into the abdominal aorta and exchanged over a J-tip guidewire for an 8 [place] Pinnacle sheath which in turn was successfully removed with the application of a closure device. The right groin showed no evidence of a hematoma or hemorrhage. The distal pulses in both feet remained Dopplerable unchanged from prior to the procedure. IMPRESSION: Status post endovascular complete revascularization of occluded left middle cerebral artery with 1 pass with the Solitaire FR 4 mm x 40 mm retrieval device, with achievement of a TICI 3 reperfusion. Groin puncture to initial reperfusion TICI2b 32 minutes. Groin  puncture to TICI reperfusion 38 minutes PLAN: The patient was transferred to the CT scanner for postprocedural CT scan of the brain. Electronically Signed   By: Luanne Bras M.D.   On: 11/29/2016 14:42   Ir Angio Vertebral Sel Subclavian Innominate Uni R Mod Sed  Result Date: 11/30/2016 INDICATION: Patient presenting with aphasia and right-sided weakness with left gaze preference. 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 CBF < 30 % 5 ml,Tmax > 6 secs of 52 ml with a mismatch vol of 1ml. EXAM: 1. EMERGENT LARGE VESSEL OCCLUSION THROMBOLYSIS (anterior CIRCULATION) 2. COMPARISON:  CT angiogram of the head and neck, and CT perfusion study of 11/29/2016. MEDICATIONS: Vancomycin 1.5 mg IV was administered within 1 hour of the procedure. ANESTHESIA/SEDATION: General anesthesia. CONTRAST:  Isovue 300 approximately 75 cc. FLUOROSCOPY TIME:  Fluoroscopy Time: 28 minutes 36 seconds (1539 mGy). COMPLICATIONS: None immediate. TECHNIQUE: Following a full explanation of the procedure along with the potential associated complications, an informed witnessed consent was obtained. The risks of intracranial hemorrhage of 10%, worsening neurological deficit, ventilator dependency, death and inability to revascularize were all reviewed in detail with the patient's . The patient was then put under general anesthesia by the Department of Anesthesiology at [facility]. The right groin was prepped and draped in the usual sterile fashion. Thereafter using modified Seldinger technique, transfemoral access into the right common femoral artery was obtained without difficulty. Over a 0.035 inch guidewire a 5 [place] Pinnacle sheath was inserted. Through this, and also over a 0.035 inch guidewire a 5 [place] JB 1 catheter was advanced to the aortic arch region and selectively positioned in the right common carotid artery and the  left common carotid artery, and the innominate artery. FINDINGS: The right subclavian arteriogram demonstrates the right vertebral artery to opacify normally to the cranial skull base. Flow is noted into the right vertebrobasilar junction and the right posterior-inferior cerebellar artery. Flash opacification of the  proximal basilar artery is also noted. The right common carotid arteriogram demonstrates the right external carotid artery and its major branches to be widely patent. The right internal carotid artery at the bulb to the cranial skull base opacifies widely. The petrous, the cavernous and the supraclinoid segments are widely patent. The right middle cerebral artery and the right anterior cerebral artery opacify into the capillary and venous phases. Flash opacification via the anterior communicating artery of the left anterior cerebral artery A2 segment is also noted. Also noted is contrast blush in the region of the middle terminate of the right maxillary sinus most consistent with inflammatory reaction. The left common carotid arteriogram demonstrates the left external carotid artery and its major branches to be widely patent. The left internal carotid artery at the bulb to the cranial skull base opacifies widely. There are mild FMD-like changes noted in the mid left internal carotid artery mid cervical segment. The petrous, the cavernous and the supraclinoid segments demonstrate wide patency. A left posterior communicating artery is seen opacifying the left posterior cerebral artery distribution. The left anterior cerebral artery opacifies into the capillary and venous phases with flash filling via the anterior communicating artery of the right anterior cerebral artery A2 and A1 segments. The left middle cerebral artery demonstrates complete angiographic occlusion in its mid M1 segment with delayed opacification of an anterior temporal branch with prominent filling defects proximally. The M1 segment,  otherwise, remains completely occluded. The delayed arterial phase in the lateral projection demonstrates partial retrograde opacification of the distal perisylvian branches with large area of hypoperfusion involving the lentiform nucleus and the caudate head. PROCEDURE: ENDOVASCULAR COMPLETE REVASCULARIZATION OF OCCLUDED LEFT MIDDLE CEREBRAL ARTERY M1 SEGMENT The diagnostic JB 1 catheter in the left common carotid artery was then exchanged over a 0.035 inch 300 cm Rosen exchange guidewire for an 8 [place] 55 cm Brite tip neurovascular sheath using biplane roadmap technique and constant fluoroscopic guidance. Good aspiration was obtained from the side port of the neurovascular sheath. This was then connected to continuous heparinized saline infusion. Over the [employer] guidewire, an 8 [place] 85 cm FlowGate balloon guide catheter which had been prepped with 50% contrast and 50% heparinized saline infusion was advanced and positioned in the distal left common carotid artery. The guidewire was removed. Good aspiration was obtained from the hub of the 8 [place] FlowGate guide catheter. A gentle contrast injection demonstrated no evidence of spasms, dissections or of intraluminal filling defects. At this time, using biplane roadmap technique and constant fluoroscopic guidance, over a 0.035 inch Roadrunner guidewire, the 8 [place] FlowGate guide catheter was then advanced to the distal cervical left ICA without difficulty. Good aspiration was obtained after removal of the wire. A gentle control arteriogram demonstrated mild spasm with no impediment of distal flow. Patient was treated with 1 aliquots of 25 mics of nitroglycerin with relief of the vasospasm. At this time, in a coaxial manner and with constant heparinized saline infusion using biplane roadmap technique and constant fluoroscopic guidance, a Trevo ProVue 021 microcatheter was advanced over a 0.014 inch Softip Synchro micro guidewire to the distal end of the  FlowGate guide catheter. With the micro guidewire leading with a J-tip configuration the combination was navigated to the supraclinoid right ICA. A torque device was then used to manipulate the guidewire into the left middle cerebral artery followed by the microcatheter. The micro guidewire was then advanced without difficulty into the M2 M3 region of the inferior division of left middle cerebral  artery followed by the microcatheter. The guidewire was removed. Good aspiration was obtained from the hub of the microcatheter. A gentle contrast injection demonstrated antegrade flow of contrast. At this time, a 4 mm x 40 mm Solitaire FR retrieval device was advanced in a coaxial manner and with constant heparinized saline infusion using biplane roadmap technique and constant fluoroscopic guidance to the distal portion of the microcatheter. At this time the entire system was straightened by loosening the O ring on the delivery microcatheter. After having ascertained the distal and the proximal positioning of the retrieval device, with slight forward gentle traction with the right hand on the delivery micro guidewire, with the left hand the microcatheter was retrieved unsheathing the entire Solitaire FR retrieval device with the tip of the microcatheter just proximal to the proximal landing zone of the retrieval device. A control arteriogram performed through the 8 [place] FlowGate guide catheter demonstrated significantly improved caliber and flow through the middle cerebral artery distribution with a narrowing of the retrieval device in the distal M1 segment. At this time, the balloon of the [facility] guide catheter was inflated in the distal left internal carotid artery for proximal flow arrest. The proximal portion of the retrieval device was captured into the microcatheter. There on after whilst aspirating with a 60 mL syringe at the hub of the [facility] guide catheter, the combination of the retrieval device and the  microcatheter were gently retrieved and removed. The aspirate demonstrated prominent clot measuring approximately 2 mm x 4 mm. Aspiration was continued as the balloon was deflated in the left internal carotid artery. The FlowGate guide catheter was gently retrieved more proximally after deflation of the balloon. A control arteriogram performed through the 8 [place] FlowGate guide catheter demonstrated complete angiographic revascularization of the left occluded left middle cerebral artery with patency of the left posterior cerebral and left anterior cerebral arteries. Focal segmental spasm was noted of the left middle cerebral artery involving the inferior division in the distal M1 region. This responded to 4 aliquots of 25 mics of nitroglycerin intra-arterially. A final control arteriogram performed demonstrated significant improved caliber of the previously noted vasospasm of the left middle cerebral artery, and also the left internal carotid artery mid cervical segment. Also noted of the final to control arteriograms a focal area of hyper density in the left centrum semiovale region. No evidence of angiographic extravasation or mass effect was seen. Patient's hemodynamic status, blood pressure and heart rate remained stable throughout the procedure. The 8 [place] FlowGate guide catheter and the 8 [place] neurovascular sheath were then retrieved into the abdominal aorta and exchanged over a J-tip guidewire for an 8 French Pinnacle sheath which in turn was successfully removed with the application of a closure device. The right groin showed no evidence of a hematoma or hemorrhage. The distal pulses in both feet remained Dopplerable unchanged from prior to the procedure. IMPRESSION: Status post endovascular complete revascularization of occluded left middle cerebral artery with 1 pass with the Solitaire FR 4 mm x 40 mm retrieval device, with achievement of a TICI 3 reperfusion. Groin puncture to initial reperfusion  TICI2b 32 minutes. Groin puncture to TICI reperfusion 38 minutes PLAN: The patient was transferred to the CT scanner for postprocedural CT scan of the brain. Electronically Signed   By: Luanne Bras M.D.   On: 11/29/2016 14:42    Labs:  CBC:  Recent Labs  04/07/16 1524 11/29/16 0834 11/29/16 0842 11/30/16 0329 12/01/16 0229  WBC 10.6* 6.3  --  20.7* 17.3*  HGB 12.8 11.7* 11.2* 12.6 10.2*  HCT 38.0 35.2* 33.0* 37.6 31.8*  PLT 289.0 204  --  281 219    COAGS:  Recent Labs  11/29/16 0834  INR 1.01  APTT 29    BMP:  Recent Labs  07/31/16 1119 11/29/16 0834 11/29/16 0842 11/30/16 0329 12/01/16 0229  NA 140 137 139 141 144  K 4.1 3.7 3.7 3.5 3.4*  CL 100 102 100* 106 114*  CO2 32 27  --  22 22  GLUCOSE 106* 141* 139* 150* 126*  BUN 28* 24* 27* 19 17  CALCIUM 9.5 8.7*  --  8.2* 8.2*  CREATININE 1.40* 1.49* 1.40* 1.26* 1.10*  GFRNONAA  --  33*  --  40* 47*  GFRAA  --  38*  --  46* 55*    LIVER FUNCTION TESTS:  Recent Labs  12/25/15 1005 04/07/16 1524 07/31/16 1119 11/29/16 0834  BILITOT 0.3 0.4 0.4 0.3  AST 18 17 21 29   ALT 14 13 16 17   ALKPHOS 62 78 71 56  PROT 7.4 7.5 7.5 6.5  ALBUMIN 3.9 4.2 4.4 3.4*    Assessment and Plan:  CVA L MCA revasc 5/13 For probable Inpt Rehab soon  Electronically Signed: Ramey Ketcherside A 12/01/2016, 1:33 PM   I spent a total of 15 Minutes at the the patient's bedside AND on the patient's hospital floor or unit, greater than 50% of which was counseling/coordinating care for CVA; L MCA revasc

## 2016-12-01 NOTE — Progress Notes (Signed)
STROKE TEAM PROGRESS NOTE   HISTORY OF PRESENT ILLNESS (per record) Valerie Mcclure is an 76 y.o. female Who was last known well at 10 PM 11/28/2016. This morning she woke up with aphasia and right hemiplegia.  CT showed hyperdense left MCA and hypodensity in the left temporal area.  CTA confirmed left MCA clot.  CTP showed CBF < 30% at 5 ml and Tmax >6 sec at 52 ml for Valerie mismatch vol of 47 ml and ratio of 10.  She has Valerie history of HTN and high cholesterol, but no DM, smoking.  Patient was not administered IV t-PA secondary to delay in arrival. She was taken to interventional neuroradiology where angiogram showed left MCA occlusion  She was admitted to the neuro ICU for further evaluation and treatment.   SUBJECTIVE (INTERVAL HISTORY)  Patient's daughter is at the bedside. Patient was exubated last evening and is doing well Blood pressure has been adequately controlled. MRI scan shows moderate sized left MCA infarct with basal ganglia hematoma with slight intraventricular extension and cytotoxic edema with slight 7 mm of midline shift  OBJECTIVE Temp:  [98 F (36.7 C)-98.2 F (36.8 C)] 98.2 F (36.8 C) (05/15 0400) Pulse Rate:  [25-138] 80 (05/15 0600) Cardiac Rhythm: Normal sinus rhythm (05/15 0600) Resp:  [13-24] 21 (05/15 0600) BP: (124-150)/(44-94) 139/54 (05/15 0600) SpO2:  [92 %-100 %] 100 % (05/15 0600) FiO2 (%):  [40 %] 40 % (05/14 1055)  CBC:  Recent Labs Lab 11/29/16 0834  11/30/16 0329 12/01/16 0229  WBC 6.3  --  20.7* 17.3*  NEUTROABS 5.3  --  18.8*  --   HGB 11.7*  < > 12.6 10.2*  HCT 35.2*  < > 37.6 31.8*  MCV 91.4  --  90.8 92.4  PLT 204  --  281 219  < > = values in this interval not displayed.  Basic Metabolic Panel:   Recent Labs Lab 11/30/16 0329 12/01/16 0229  NA 141 144  K 3.5 3.4*  CL 106 114*  CO2 22 22  GLUCOSE 150* 126*  BUN 19 17  CREATININE 1.26* 1.10*  CALCIUM 8.2* 8.2*  MG  --  2.1  PHOS  --  1.9*    Lipid Panel:     Component  Value Date/Time   CHOL 162 11/30/2016 0329   TRIG 175 (H) 11/30/2016 0329   HDL 42 11/30/2016 0329   CHOLHDL 3.9 11/30/2016 0329   VLDL 35 11/30/2016 0329   LDLCALC 85 11/30/2016 0329   HgbA1c:  Lab Results  Component Value Date   HGBA1C 5.8 (H) 11/30/2016   Urine Drug Screen: No results found for: LABOPIA, COCAINSCRNUR, LABBENZ, AMPHETMU, THCU, LABBARB  Alcohol Level No results found for: ETH  IMAGING  Ct Head Wo Contrast 11/29/2016 0906 Hypodensity left temporal lobe consistent with acute infarct. Hyperdense left MCA compatible with thrombus or embolus. Aspects score 8  Ct Angio Head W Or Wo Contrast Ct Angio Neck W Or Wo Contrast Ct Cerebral Perfusion W Contrast 11/29/2016 Left M1 occlusion due to acute thrombus or embolus. Hypodensity left lower temporal lobe compatible with acute infarct. There is significant penumbra in the left temporoparietal lobe. Irregular noncalcified plaque left internal carotid artery could be Valerie source of emboli or not the high non stenotic atherosclerotic disease. No significant stenosis in the carotid or vertebral arteries in the neck. Atherosclerotic calcification in the cavernous carotid bilaterally.   Ir Percutaneous Art Thrombectomy/infusion Intracranial Inc Diag Angio Ir Angio Intra Extracran Sel Com  Carotid Innominate Uni L Mod Sed Ir Angio Vertebral Sel Subclavian Innominate Uni R Mod Sed 11/29/2016 Status post endovascular complete revascularization of occluded left middle cerebral artery with 1 pass with the Solitaire FR 4 mm x 40 mm retrieval device, with achievement of Valerie TICI 3 reperfusion. Groin puncture to initial reperfusion TICI2b 32 minutes. Groin puncture to TICI reperfusion 38 minutes     Ct Head Wo Contrast 11/29/2016 Large area of hemorrhage and extravasated contrast in the left basal ganglia, blood volume 39 mL. Hemorrhagic transformation of left temporal infarct. Mild amount of subdural hematoma along the tentorium bilaterally. 6  mm midline shift to the right.   MRI head 11/30/2016 1. Left MCA infarct with the most confluent diffusion restriction in the left temporal lobe. Superimposed 5.5 cm intra-axial hemorrhage centered at the left basal ganglia with intra-axial blood volume estimated 26 mL. Associated left hemisphere edema.  2. Rightward midline shift of 7 mm.  Basilar cisterns remain patent. 3. Effaced left lateral ventricle and trace intraventricular hemorrhage without ventriculomegaly. 4. Occasional small foci of restricted diffusion also at the left PCA territory.  Dg Chest Port 1 View 11/29/2016 1. No active cardiopulmonary disease. No evidence of pneumonia or pulmonary edema. 2. Stable cardiomegaly. 3. Aortic atherosclerosis.  11/30/2016 Endotracheal tube approximately 2.3 cm from the carina. Enteric tube in place. Borderline cardiomegaly.  No new abnormality.   Dg Abd Portable 1v 11/30/2016 Enteric tube tip projects over the mid stomach.    PHYSICAL EXAM Elderly Caucasian lady who is not in distress. . Afebrile. Head is nontraumatic. Neck is supple without bruit.    Cardiac exam no murmur or gallop. Lungs are clear to auscultation. Distal pulses are well felt. Neurological Exam :  Patient is awake and interactive she has moderate dysarthria. She follows commands well. Pupils irregular 3 mm reactive. Left gaze preference but will try to look to the right to midline. Blinks to threat on the left but not the right. Right lower facial weakness. Tongue midline. Patient follows simple midline commands on the left side. She has   right hemiplegia with grade 2/5 right upper extremity and 3/5 right lower extremity strength..  Sensation appears preserved bilaterally. Reflexes are normal on the left and depressed on the right. Left plantar is downgoing right is upgoing.   ASSESSMENT/PLAN Valerie Mcclure is Valerie 76 y.o. female with history of chronic low back pain, HTN, HLD, hypothyroidism presenting with aphasia  and right hemiplegia on awakening. She did not receive IV t-PA due to delay in arrival. Taken to interventional neuroradiology was she received to [facility] revascularization of the occluded left MCA with mechanical thrombectomy. Patient with post IR hemorrhage.  Stroke:   Left MCA infarct status post mechanical thrombectomy with TICI3 revascularization of occluded L MCA. Post IR large L basal ganglia hemorrhage w/ hemorrhagic transforation in L temporal infarct, B SDH, IVH and cerebral edema w/ R shift (subfalcine herniation). infarct embolic secondary to unknown source  Resultant   right hemiplegia, dysarthria  CT head , left temporal lobe hypodensity. Hyperdense left MCA. Aspects 8  CTA H and N L M1 occlusion. Left temporal lobe hypodensity. L ICA irregular plaque. Bilateral carotid atherosclerosis  Cerebral angiogram occluded L MCA with eventual TICI3 revascularization with 1 pass with solitaire.   CT perfusion Significant penumbra  Post IR CT large hemorrhage with extravasated contrast, left basal ganglia, volume 39 mils. Hemorrhagic transformation. Left temporal infarct. Subdural hematoma, bilateral tentorium. 6 mm rightward midline shift. Spasm post IR  treated w/ NTG.  MRI  L MCA infarct. L BG hemorrhage. L brain edema. Right shift 35mm. Trace IVH. Small foci L PCA restricted diffusion  2D Echo  pending   LDL 85  HgbA1c pending  SCDs for VTE prophylaxis Diet NPO time specified  No antithrombotic prior to admission, now on No antithrombotic  Due to [place]  Ongoing aggressive stroke risk factor management  Therapy recommendations:  pending   Disposition:  pending   Acute respiratory failure   Intubated for neuro intervention   Plan to extubate after MRI if stable   Hypertension  Goal per intervention   Currently stable on Celebrex   Home meds: Lopressor, Procardia, diovan  Hyperlipidemia  Home meds:  No statin  LDL 85, goal < 70  Add statin once able to swallow     Other Stroke Risk Factors  Advanced age  Obesity, Body mass index is 31.05 kg/m., recommend weight loss, diet and exercise as appropriate   Other Active Problems  Chronic low back pain, on morphine  Anemia with hx multiple transfusions  On estrace  Daily  On prozac. Pamelor  On lasix  Hypothyroid, on Synthroid  Hospital day # 2 I have personally examined this patient, reviewed notes, independently viewed imaging studies, participated in medical decision making and plan of care.ROS completed by me personally and pertinent positives fully documented  I have made any additions or clarifications directly to the above note. She presented with right hemiplegia secondary to left middle cerebral artery infarct with left nasal cerebral artery occlusion and underwent mechanical embolectomy with complete revascularization. MRI scan shows moderate left MCA infarct with basal ganglia hematoma with mild intraventricular extension. Recommend strict control of blood pressure and close neurological monitoring. Plan to hold antiplatelet therapy for Valerie few days. Valerie repeat CT scan of the head tomorrow morning. Mobilize out of bed. Physical occupational and speech therapy consults..Long discussion with the daughter at the bedside and answered questions about her prognosis and care. This patient is critically ill and at significant risk of neurological worsening, death and care requires constant monitoring of vital signs, hemodynamics,respiratory and cardiac monitoring, extensive review of multiple databases, frequent neurological assessment, discussion with family, other specialists and medical decision making of high complexity.I have made any additions or clarifications directly to the above note.This critical care time does not reflect procedure time, or teaching time or supervisory time of PA/NP/Med Resident etc but could involve care discussion time.  I spent 40 minutes of neurocritical care time  in the  care of  this patient.     Antony Contras, MD Medical Director [facility] Pager: [phone] 12/01/2016 7:58 AM   To contact Stroke Continuity provider, please refer to [email or web address] After hours, contact General Neurology

## 2016-12-01 NOTE — Evaluation (Signed)
Clinical/Bedside Swallow Evaluation Patient Details  Name: Valerie Mcclure MRN: [id number] Date of Birth: 1940-09-01  Today's Date: 12/01/2016 Time: SLP Start Time (ACUTE ONLY): 0258 SLP Stop Time (ACUTE ONLY): 0931 SLP Time Calculation (min) (ACUTE ONLY): 10 min  Past Medical History:  Past Medical History:  Diagnosis Date  . Blood transfusion without reported diagnosis   . Chronic low back pain   . Hyperlipidemia   . Osteopenia   . Unspecified essential hypertension   . Unspecified hypothyroidism    Past Surgical History:  Past Surgical History:  Procedure Laterality Date  . ABDOMINAL HYSTERECTOMY  1970  . CHOLECYSTECTOMY  07/2009   Dr. Rise Patience  . COLONOSCOPY  2003  . FLEXIBLE SIGMOIDOSCOPY  2010  . HAND SURGERY    . IR ANGIO INTRA EXTRACRAN SEL COM CAROTID INNOMINATE UNI L MOD SED  11/29/2016  . IR ANGIO VERTEBRAL SEL SUBCLAVIAN INNOMINATE UNI R MOD SED  11/29/2016  . IR PERCUTANEOUS ART THROMBECTOMY/INFUSION INTRACRANIAL INC DIAG ANGIO  11/29/2016  . LUMBAR LAMINECTOMY  11/2008   Done by Dr. Patrice Paradise  . RADIOLOGY WITH ANESTHESIA N/A 11/29/2016   Procedure: RADIOLOGY WITH ANESTHESIA;  Surgeon: Radiologist, Medication, MD;  Location: [place];  Service: Radiology;  Laterality: N/A;  . THYROIDECTOMY     HPI:  Ptis a 76 y.o.femalewith history of chronic low back pain, HTN, HLD, hypothyroidism presenting with aphasia and right hemiplegia on awakening. Shedidnot receive IV t-PA due to delay in arrival but was taken to interventional neuroradiology was she received  TICI3 revascularization of the occluded left MCA with mechanical thrombectomy. Patient with post IR hemorrhage. MRI showed moderate sized L MCA infarct with basal ganglia hematoma with slight intraventricular extension and cytotoxic edema with slight 7 mm of midline shift. She was intubated 5/13-5/14.   Assessment / Plan / Recommendation Clinical Impression  Pt has audible swallows that are seemingly discoordinated,  with multiple subswallows per thin liquid bolus. She has immediate coughing and wet vocal quality with thin liquids, and delayed throat clearing with purees. Despite her right-sided weakness, she has good oral clearance. Given signs of dysphagia and concern for aspiration, recommend to proceed with FEES to better assess oropharyngeal swallow. SLP Visit Diagnosis: Dysphagia, unspecified (R13.10)    Aspiration Risk  Moderate aspiration risk    Diet Recommendation NPO   Medication Administration: Via alternative means    Other  Recommendations Oral Care Recommendations: Oral care QID Other Recommendations: Have oral suction available   Follow up Recommendations  (tba)      Frequency and Duration            Prognosis Prognosis for Safe Diet Advancement: Good Barriers to Reach Goals: Language deficits      Swallow Study   General HPI: Ptis a 76 y.o.femalewith history of chronic low back pain, HTN, HLD, hypothyroidism presenting with aphasia and right hemiplegia on awakening. Shedidnot receive IV t-PA due to delay in arrival but was taken to interventional neuroradiology was she received  TICI3 revascularization of the occluded left MCA with mechanical thrombectomy. Patient with post IR hemorrhage. MRI showed moderate sized L MCA infarct with basal ganglia hematoma with slight intraventricular extension and cytotoxic edema with slight 7 mm of midline shift. She was intubated 5/13-5/14. Type of Study: Bedside Swallow Evaluation Previous Swallow Assessment: none in chart Diet Prior to this Study: NPO Temperature Spikes Noted: No Respiratory Status: Nasal cannula History of Recent Intubation: Yes Length of Intubations (days): 1 days Date extubated: 11/30/16 Behavior/Cognition: Alert;Cooperative;Requires cueing Oral  Cavity Assessment: Other (comment) (redness on posterior oral cavity/velum) Oral Cavity - Dentition: Adequate natural dentition Vision: Functional for  self-feeding Self-Feeding Abilities: Able to feed self;Needs assist Patient Positioning: Upright in bed Baseline Vocal Quality: Normal Volitional Cough: Weak Volitional Swallow: Unable to elicit    Oral/Motor/Sensory Function Overall Oral Motor/Sensory Function: Moderate impairment Facial ROM: Reduced right;Suspected CN VII (facial) dysfunction Facial Symmetry: Abnormal symmetry right;Suspected CN VII (facial) dysfunction Facial Strength: Reduced right;Suspected CN VII (facial) dysfunction Lingual ROM: Reduced left;Suspected CN XII (hypoglossal) dysfunction Lingual Symmetry: Abnormal symmetry left;Suspected CN XII (hypoglossal) dysfunction Lingual Strength: Reduced;Suspected CN XII (hypoglossal) dysfunction Velum: Within Functional Limits Mandible: Within Functional Limits   Ice Chips Ice chips: Within functional limits Presentation: Spoon   Thin Liquid Thin Liquid: Impaired Presentation: Spoon;Cup Pharyngeal  Phase Impairments: Suspected delayed Swallow;Cough - Immediate;Wet Vocal Quality    Nectar Thick Nectar Thick Liquid: Not tested   Honey Thick Honey Thick Liquid: Not tested   Puree Puree: Impaired Presentation: Spoon Pharyngeal Phase Impairments: Other (comments);Throat Clearing - Delayed (audible swallows)   Solid   GO   Solid: Not tested        Valerie Mcclure 12/01/2016,9:59 AM  Valerie Mcclure, M.A. CCC-SLP [phone]

## 2016-12-02 ENCOUNTER — Inpatient Hospital Stay (HOSPITAL_COMMUNITY): Payer: Medicare Other

## 2016-12-02 DIAGNOSIS — E785 Hyperlipidemia, unspecified: Secondary | ICD-10-CM

## 2016-12-02 DIAGNOSIS — I63412 Cerebral infarction due to embolism of left middle cerebral artery: Principal | ICD-10-CM

## 2016-12-02 DIAGNOSIS — I340 Nonrheumatic mitral (valve) insufficiency: Secondary | ICD-10-CM

## 2016-12-02 DIAGNOSIS — I10 Essential (primary) hypertension: Secondary | ICD-10-CM

## 2016-12-02 DIAGNOSIS — I610 Nontraumatic intracerebral hemorrhage in hemisphere, subcortical: Secondary | ICD-10-CM

## 2016-12-02 DIAGNOSIS — I639 Cerebral infarction, unspecified: Secondary | ICD-10-CM

## 2016-12-02 LAB — ECHOCARDIOGRAM COMPLETE
E decel time: 155 msec
E/e' ratio: 7.61
FS: 29 % (ref 28–44)
Height: 63 in
IVS/LV PW RATIO, ED: 0.94
LA ID, A-P, ES: 40 mm
LA diam end sys: 40 mm
LA vol A4C: 52 ml
LADIAMINDEX: 2.19 cm/m2
LDCA: 2.84 cm2
LV E/e'average: 7.61
LV TDI E'LATERAL: 15.9
LV TDI E'MEDIAL: 7.72
LV e' LATERAL: 15.9 cm/s
LVEEMED: 7.61
LVOT VTI: 21.5 cm
LVOT peak vel: 106 cm/s
LVOTD: 19 mm
LVOTSV: 61 mL
Lateral S' vel: 13.4 cm/s
MV Dec: 155
MV pk E vel: 121 m/s
MVPG: 6 mmHg
MVPKAVEL: 72.2 m/s
P 1/2 time: 163 ms
PW: 9.85 mm — AB (ref 0.6–1.1)
RV sys press: 47 mmHg
Reg peak vel: 311 cm/s
TAPSE: 20.4 mm
TRMAXVEL: 311 cm/s
Weight: 2804.25 oz

## 2016-12-02 LAB — BASIC METABOLIC PANEL
Anion gap: 8 (ref 5–15)
BUN: 14 mg/dL (ref 6–20)
CALCIUM: 8.1 mg/dL — AB (ref 8.9–10.3)
CHLORIDE: 118 mmol/L — AB (ref 101–111)
CO2: 19 mmol/L — ABNORMAL LOW (ref 22–32)
CREATININE: 1.02 mg/dL — AB (ref 0.44–1.00)
GFR calc non Af Amer: 52 mL/min — ABNORMAL LOW (ref >60)
GFR, EST AFRICAN AMERICAN: 60 mL/min — AB (ref >60)
Glucose, Bld: 158 mg/dL — ABNORMAL HIGH (ref 65–99)
Potassium: 2.9 mmol/L — ABNORMAL LOW (ref 3.5–5.1)
SODIUM: 145 mmol/L (ref 135–145)

## 2016-12-02 LAB — CBC
HCT: 31 % — ABNORMAL LOW (ref 36.0–46.0)
Hemoglobin: 9.7 g/dL — ABNORMAL LOW (ref 12.0–15.0)
MCH: 29.1 pg (ref 26.0–34.0)
MCHC: 31.3 g/dL (ref 30.0–36.0)
MCV: 93.1 fL (ref 78.0–100.0)
PLATELETS: 186 10*3/uL (ref 150–400)
RBC: 3.33 MIL/uL — AB (ref 3.87–5.11)
RDW: 15.7 % — AB (ref 11.5–15.5)
WBC: 12.4 10*3/uL — ABNORMAL HIGH (ref 4.0–10.5)

## 2016-12-02 MED ORDER — FUROSEMIDE 10 MG/ML IJ SOLN
40.0000 mg | Freq: Once | INTRAMUSCULAR | Status: AC
  Administered 2016-12-02: 40 mg via INTRAVENOUS
  Filled 2016-12-02: qty 4

## 2016-12-02 MED ORDER — MORPHINE SULFATE ER 15 MG PO TBCR
15.0000 mg | EXTENDED_RELEASE_TABLET | Freq: Two times a day (BID) | ORAL | Status: DC
  Administered 2016-12-02 – 2016-12-03 (×3): 15 mg via ORAL
  Filled 2016-12-02 (×3): qty 1

## 2016-12-02 MED ORDER — METOPROLOL TARTRATE 25 MG PO TABS
25.0000 mg | ORAL_TABLET | Freq: Two times a day (BID) | ORAL | Status: DC
  Administered 2016-12-02 – 2016-12-03 (×3): 25 mg via ORAL
  Filled 2016-12-02 (×3): qty 1

## 2016-12-02 MED ORDER — LEVOTHYROXINE SODIUM 75 MCG PO TABS
75.0000 ug | ORAL_TABLET | Freq: Once | ORAL | Status: AC
  Administered 2016-12-02: 75 ug via ORAL

## 2016-12-02 MED ORDER — QUETIAPINE FUMARATE 25 MG PO TABS
25.0000 mg | ORAL_TABLET | Freq: Four times a day (QID) | ORAL | Status: DC | PRN
  Administered 2016-12-02: 25 mg via ORAL
  Filled 2016-12-02: qty 1

## 2016-12-02 MED ORDER — IPRATROPIUM-ALBUTEROL 0.5-2.5 (3) MG/3ML IN SOLN
3.0000 mL | Freq: Four times a day (QID) | RESPIRATORY_TRACT | Status: DC | PRN
  Administered 2016-12-02: 3 mL via RESPIRATORY_TRACT
  Filled 2016-12-02: qty 3

## 2016-12-02 MED ORDER — PANTOPRAZOLE SODIUM 40 MG PO TBEC
40.0000 mg | DELAYED_RELEASE_TABLET | Freq: Every day | ORAL | Status: DC
  Administered 2016-12-02: 40 mg via ORAL
  Filled 2016-12-02: qty 1

## 2016-12-02 MED ORDER — POTASSIUM CHLORIDE CRYS ER 20 MEQ PO TBCR
40.0000 meq | EXTENDED_RELEASE_TABLET | ORAL | Status: AC
  Administered 2016-12-02 (×3): 40 meq via ORAL
  Filled 2016-12-02 (×4): qty 2

## 2016-12-02 MED ORDER — FLUOXETINE HCL 20 MG PO CAPS
40.0000 mg | ORAL_CAPSULE | Freq: Every day | ORAL | Status: DC
Start: 2016-12-02 — End: 2016-12-03
  Administered 2016-12-02 – 2016-12-03 (×2): 40 mg via ORAL
  Filled 2016-12-02 (×2): qty 2

## 2016-12-02 MED ORDER — SIMVASTATIN 20 MG PO TABS
20.0000 mg | ORAL_TABLET | Freq: Every day | ORAL | Status: DC
  Administered 2016-12-02: 20 mg via ORAL
  Filled 2016-12-02: qty 1

## 2016-12-02 MED ORDER — HYDRALAZINE HCL 20 MG/ML IJ SOLN: 10.0000 mg | INTRAMUSCULAR | Status: DC | PRN

## 2016-12-02 MED ORDER — LEVOTHYROXINE SODIUM 75 MCG PO TABS
75.0000 ug | ORAL_TABLET | Freq: Every day | ORAL | Status: DC
  Administered 2016-12-03: 75 ug via ORAL
  Filled 2016-12-02 (×2): qty 1

## 2016-12-02 MED ORDER — VITAMIN B-12 100 MCG PO TABS
100.0000 ug | ORAL_TABLET | Freq: Every day | ORAL | Status: DC
  Administered 2016-12-02 – 2016-12-03 (×2): 100 ug via ORAL
  Filled 2016-12-02 (×3): qty 1

## 2016-12-02 MED ORDER — NORTRIPTYLINE HCL 10 MG PO CAPS
20.0000 mg | ORAL_CAPSULE | Freq: Every day | ORAL | Status: DC
  Administered 2016-12-02: 20 mg via ORAL
  Filled 2016-12-02: qty 2

## 2016-12-02 NOTE — Progress Notes (Signed)
PT Cancellation Note  Patient Details Name: Valerie Mcclure MRN: [id number] DOB: Oct 30, 1940   Cancelled Treatment:    Reason Eval/Treat Not Completed: Patient at procedure or test/unavailable Pt getting procedure done in room. Will follow up.   [place] 12/02/2016, 8:50 AM Wray Kearns, PT, DPT [phone]

## 2016-12-02 NOTE — Progress Notes (Signed)
  Echocardiogram 2D Echocardiogram has been performed.  Valerie Mcclure 12/02/2016, 9:22 AM

## 2016-12-02 NOTE — Progress Notes (Signed)
*  PRELIMINARY RESULTS* Vascular Ultrasound Bilateral lower extremity venous duplex has been completed.  Preliminary findings: No evidence of deep vein thrombosis in the visualized veins of the lower extremities.  Somewhat limited evaluation of the right lower extremity due to bandage in the groin obscuring visualization of the CFV and GSV and poor positioning of right lower extremity.  Negative for baker's cysts bilaterally.   Valerie Mcclure 12/02/2016, 3:11 PM

## 2016-12-02 NOTE — Progress Notes (Signed)
Pt family member spoke with this nurse about increased agitation, picking at stuff, trying to get out of bed . Pt appears to be flustered and frustrated with inability to communicate effectively. Neurology notified. New orders received and carried out. Will continue to monitor. Lianne Bushy RN BSN

## 2016-12-02 NOTE — Progress Notes (Signed)
Physical Therapy Treatment Patient Details Name: Valerie Mcclure MRN: [id number] DOB: 06/29/1941 Today's Date: 12/02/2016    History of Present Illness Pt is a 76 y.o. female with history of chronic low back pain, HTN, HLD, hypothyroidism presenting with aphasia and right hemiplegia on awakening. She did not receive IV t-PA due to delay in arrival but was taken to interventional neuroradiology was she received  TICI3 revascularization of the occluded left MCA with mechanical thrombectomy. Patient with post IR hemorrhage. MRI showed moderate sized L MCA infarct with basal ganglia hematoma with slight intraventricular extension and cytotoxic edema with slight 7 mm of midline shift. She was intubated 5/13-5/14.     PT Comments    Patient progressing slowly towards PT goals. Continues to demonstrate motor apraxia, expressive aphasia and impaired mobility. Has movement in RUE. Able to follow simple 1 step commands with increased time and gestural cues for more complex motor tasks such as gait. Requires Max A of 2 for gait training due to decreased trunk control, difficulty managing RLE and safety. Great rehab candidate. Per spouse, pt has been under a lot of stress recently at home but she is very motivated to maximize independence and mobility. Will follow.    Follow Up Recommendations  CIR     Equipment Recommendations  Other (comment) (TBA)    Recommendations for Other Services       Precautions / Restrictions Precautions Precautions: Fall Restrictions Weight Bearing Restrictions: No    Mobility  Bed Mobility Overal bed mobility: Needs Assistance Bed Mobility: Supine to Sit     Supine to sit: Mod assist;HOB elevated     General bed mobility comments: Able to bring RLE to EOB with increased time and cues; use of rail and assist to elevate trunk to get to EOB. Listing right initially but able to correct with Min A.  Transfers Overall transfer level: Needs  assistance Equipment used: 2 person hand held assist Transfers: Sit to/from Stand Sit to Stand: Mod assist;+2 safety/equipment         General transfer comment: Stood from EOB x3, from chair x4 with right hand on right knee and left hand providing WB and input into RUE upon standing. Using chair for support in front for standing tasks. Some instability right knee and it goes out into knee extension requiring Mod A to bring RLE back under patient with heavy lean right. Transferred to chair post ambulation.  Ambulation/Gait Ambulation/Gait assistance: Max assist;+2 physical assistance Ambulation Distance (Feet): 6 Feet Assistive device: 2 person hand held assist (hands on chair) Gait Pattern/deviations: Step-to pattern;Decreased step length - right;Decreased stance time - right;Narrow base of support;Step-through pattern;Trunk flexed Gait velocity: decreased Gait velocity interpretation: Below normal speed for age/gender General Gait Details: Pt with RLE adduction/ER and difficulty sequencing progression without manual cues to control RLE. Assist for trunk control due ot right lateral lean, maintain right hand supported on chair and assist for right knee due to instability with weight bearing. Assist for weight shift as well. 3/4 DOE requiring seated rest break.   Stairs            Wheelchair Mobility    Modified Rankin (Stroke Patients Only) Modified Rankin (Stroke Patients Only) Pre-Morbid Rankin Score: No symptoms Modified Rankin: Moderately severe disability     Balance Overall balance assessment: Needs assistance Sitting-balance support: Feet supported;Single extremity supported Sitting balance-Leahy Scale: Poor Sitting balance - Comments: Tended to list R with R LE extending in response to her balance reactions; but  able to self correct with Min A and cues.   Standing balance support: Bilateral upper extremity supported;During functional activity Standing balance-Leahy  Scale: Poor Standing balance comment: Having difficulty weight shifring left and tended to list right needing truncal and RLE support to maintain good BoS. Cues for hip extension and upright posture as well.                            Cognition Arousal/Alertness: Awake/alert Behavior During Therapy: WFL for tasks assessed/performed Overall Cognitive Status: Difficult to assess                                 General Comments: Pt follows one step commands consistently with gestural cues for more complex commands or multi step commands. Expressive aphasia noted.      Exercises      General Comments        Pertinent Vitals/Pain Pain Assessment: Faces Faces Pain Scale: No hurt    Home Living                      Prior Function            PT Goals (current goals can now be found in the care plan section) Progress towards PT goals: Progressing toward goals    Frequency    Min 4X/week      PT Plan Current plan remains appropriate    Co-evaluation              AM-PAC PT "6 Clicks" Daily Activity  Outcome Measure  Difficulty turning over in bed (including adjusting bedclothes, sheets and blankets)?: Total Difficulty moving from lying on back to sitting on the side of the bed? : Total Difficulty sitting down on and standing up from a chair with arms (e.g., wheelchair, bedside commode, etc,.)?: Total Help needed moving to and from a bed to chair (including a wheelchair)?: A Lot Help needed walking in hospital room?: A Lot Help needed climbing 3-5 steps with a railing? : Total 6 Click Score: 8    End of Session Equipment Utilized During Treatment: Gait belt Activity Tolerance: Patient tolerated treatment well;Patient limited by fatigue Patient left: in chair;with call bell/phone within reach;with family/visitor present;with chair alarm set Nurse Communication: Mobility status PT Visit Diagnosis: Hemiplegia and  hemiparesis;Apraxia (R48.2) Hemiplegia - Right/Left: Right Hemiplegia - dominant/non-dominant: Dominant Hemiplegia - caused by: Cerebral infarction     Time: 4782-9562 PT Time Calculation (min) (ACUTE ONLY): 28 min  Charges:  $Gait Training: 8-22 mins $Neuromuscular Re-education: 8-22 mins                    G Codes:       Wray Kearns, PT, DPT [phone]     [place] 12/02/2016, 11:14 AM

## 2016-12-02 NOTE — Progress Notes (Signed)
Progress Note  Patient Name: Valerie Mcclure Date of Encounter: 12/02/2016  Primary Cardiologist: new,  Nah[facility]   Subjective   76 yo with admission for CVA. Had hemorraghic transformation during mechanical thrombectomy  Was thought to have atrial fib Review of tele reveals frequent PACs but no atrial fib   Inpatient Medications    Scheduled Meds: .  stroke: mapping our early stages of recovery book   Does not apply Once  . levothyroxine  37.5 mcg Intravenous Daily  . pantoprazole (PROTONIX) IV  40 mg Intravenous QHS  . pneumococcal 23 valent vaccine  0.5 mL Intramuscular Tomorrow-1000   Continuous Infusions: . sodium chloride 100 mL/hr at 12/02/16 0700   PRN Meds: acetaminophen **OR** acetaminophen (TYLENOL) oral liquid 160 mg/5 mL **OR** acetaminophen, hydrALAZINE, morphine injection, ondansetron (ZOFRAN) IV, QUEtiapine, RESOURCE THICKENUP CLEAR, senna-docusate   Vital Signs    Vitals:   12/02/16 0500 12/02/16 0600 12/02/16 0700 12/02/16 0800  BP: (!) 159/86 (!) 142/63 137/65   Pulse:  76 74   Resp: (!) 24 (!) 22 (!) 21   Temp:    97.6 F (36.4 C)  TempSrc:    Oral  SpO2:  97% 99%   Weight:      Height:        Intake/Output Summary (Last 24 hours) at 12/02/16 0918 Last data filed at 12/02/16 0700  Gross per 24 hour  Intake             2200 ml  Output              350 ml  Net             1850 ml   Filed Weights   11/29/16 1400 11/29/16 2000  Weight: 164 lb 14.5 oz (74.8 kg) 175 lb 4.3 oz (79.5 kg)    Telemetry    NSR , PACs  - Personally Reviewed  ECG    NSR  - Personally Reviewed  Physical Exam   GEN: No acute distress.   Neck: No JVD Cardiac: irreg irreg.   Respiratory: Clear to auscultation bilaterally. GI:  mildly obese   MS: No edema; No deformity. Neuro:   s/p CVA  Psych: Normal affect   Labs    Chemistry Recent Labs Lab 11/29/16 [phone] 11/29/16 0842 11/30/16 0329 12/01/16 0229  NA 137 139 141 144  K 3.7 3.7 3.5 3.4*  CL  102 100* 106 114*  CO2 27  --  22 22  GLUCOSE 141* 139* 150* 126*  BUN 24* 27* 19 17  CREATININE 1.49* 1.40* 1.26* 1.10*  CALCIUM 8.7*  --  8.2* 8.2*  PROT 6.5  --   --   --   ALBUMIN 3.4*  --   --   --   AST 29  --   --   --   ALT 17  --   --   --   ALKPHOS 56  --   --   --   BILITOT 0.3  --   --   --   GFRNONAA 33*  --  40* 47*  GFRAA 38*  --  46* 55*  ANIONGAP 8  --  13 8     Hematology Recent Labs Lab 11/30/16 0329 12/01/16 0229 12/02/16 0755  WBC 20.7* 17.3* 12.4*  RBC 4.14 3.44* 3.33*  HGB 12.6 10.2* 9.7*  HCT 37.6 31.8* 31.0*  MCV 90.8 92.4 93.1  MCH 30.4 29.7 29.1  MCHC 33.5 32.1 31.3  RDW  14.7 15.2 15.7*  PLT 281 219 186    Cardiac EnzymesNo results for input(s): TROPONINI in the last 168 hours.  Recent Labs Lab 11/29/16 0840  TROPIPOC 1.86*     BNPNo results for input(s): BNP, PROBNP in the last 168 hours.   DDimer No results for input(s): DDIMER in the last 168 hours.   Radiology    Mr Brain Wo Contrast  Result Date: 11/30/2016 CLINICAL DATA:  76 year old female who awoke with aphasia and right facial droop. Left M1 occlusion status post neuro endovascular revascularization. Subsequent large left hemisphere hemorrhage. EXAM: MRI HEAD WITHOUT CONTRAST TECHNIQUE: Multiplanar, multiecho pulse sequences of the brain and surrounding structures were obtained without intravenous contrast. COMPARISON:  Post endovascular Head CT 11/29/2016, and earlier. FINDINGS: Brain: Cortical and subcortical white matter restricted diffusion along the anterior left temporal tip and tracking posteriorly along the left superior and middle temporal gyri (Series 5, image 19). Superimposed intracranial hemorrhage centered at the left basal ganglia with T2 and T1 heterogeneous blood products encompassing 55 x 30 x 31 mm (AP by transverse by CC) for an estimated blood volume of 26 mL. Associated diffusion susceptibility in the area of the hemorrhage, but there also seems to be some  marginal restricted diffusion. Small volume of intraventricular hemorrhage layering in the occipital horns. Effaced left lateral ventricle. No ventriculomegaly. Edema surrounding the area of hemorrhage, and cytotoxic edema in the left temporal lobe. Rightward midline shift of 7 mm. Patent basilar cisterns. There are occasional small foci of restricted diffusion in the left PCA territory including the dorsal left thalamus and anterior left occipital lobe. No posterior fossa or contralateral right hemisphere diffusion restriction. Patchy nonspecific T2 hyperintensity in the pons. No cortical encephalomalacia. No chronic cerebral blood products. Negative pituitary and cervicomedullary junction. Vascular: Major intracranial vascular flow voids are preserved. Skull and upper cervical spine: Negative. Sinuses/Orbits: Normal orbits soft tissues. Visualized paranasal sinuses and mastoids are stable and well pneumatized. Other: Layering fluid in the pharynx.  Negative scalp soft tissues. IMPRESSION: 1. Left MCA infarct with the most confluent diffusion restriction in the left temporal lobe. Superimposed 5.5 cm intra-axial hemorrhage centered at the left basal ganglia with intra-axial blood volume estimated 26 mL. Associated left hemisphere edema. 2. Rightward midline shift of 7 mm.  Basilar cisterns remain patent. 3. Effaced left lateral ventricle and trace intraventricular hemorrhage without ventriculomegaly. 4. Occasional small foci of restricted diffusion also at the left PCA territory. Electronically Signed   By: Genevie Ann M.D.   On: 11/30/2016 13:59   Dg Chest Port 1 View  Result Date: 12/01/2016 CLINICAL DATA:  Stroke, history of hypertension, extubation of the trachea and esophagus. EXAM: PORTABLE CHEST 1 VIEW COMPARISON:  Portable chest x-ray of Nov 29, 2016. FINDINGS: The lungs are adequately inflated. There is no focal infiltrate. The retrocardiac lung markings on the left remain mildly prominent. There is no  significant pleural effusion and there is no pneumothorax. The heart is top-normal in size. The pulmonary vascularity is normal. There is calcification in the wall of the thoracic aorta. IMPRESSION: Good inflation of both lungs since extubation. Minimal subsegmental atelectasis at the left lung base is suspected. No CHF. Thoracic aortic atherosclerosis. Electronically Signed   By: David  Martinique M.D.   On: 12/01/2016 07:27    Cardiac Studies      Patient Profile     76 y.o. female admitted with CVA .   Had hemorrhagic transformation Was thought to have atrial fib on tele -  review actually shows NSR with frequent PACs   Assessment & Plan    1.  Possible atrial fib- no evidence of atrial fib at this point  Echo is pending   2.   CVA - plans per neuro   Signed, Mertie Moores, MD  12/02/2016, 9:18 AM

## 2016-12-02 NOTE — Progress Notes (Signed)
STROKE TEAM PROGRESS NOTE   SUBJECTIVE (INTERVAL HISTORY) Patient's daughter and husband are at the bedside. Patient tolerating extubation well. Pt is sitting in chair after PT session. Awake alert, still has mild aphasia and right hemiparesis. Agreed on TEE and loop.   OBJECTIVE Temp:  [97.6 F (36.4 C)-98.4 F (36.9 C)] 97.8 F (36.6 C) (05/16 1323) Pulse Rate:  [37-114] 69 (05/16 1323) Cardiac Rhythm: Normal sinus rhythm (05/16 1358) Resp:  [14-26] 18 (05/16 1323) BP: (86-162)/(54-86) 149/70 (05/16 1323) SpO2:  [95 %-100 %] 99 % (05/16 1323)  CBC:  Recent Labs Lab 11/29/16 0834  11/30/16 0329 12/01/16 0229 12/02/16 0755  WBC 6.3  --  20.7* 17.3* 12.4*  NEUTROABS 5.3  --  18.8*  --   --   HGB 11.7*  < > 12.6 10.2* 9.7*  HCT 35.2*  < > 37.6 31.8* 31.0*  MCV 91.4  --  90.8 92.4 93.1  PLT 204  --  281 219 186  < > = values in this interval not displayed.  Basic Metabolic Panel:   Recent Labs Lab 12/01/16 0229 12/02/16 0755  NA 144 145  K 3.4* 2.9*  CL 114* 118*  CO2 22 19*  GLUCOSE 126* 158*  BUN 17 14  CREATININE 1.10* 1.02*  CALCIUM 8.2* 8.1*  MG 2.1  --   PHOS 1.9*  --     Lipid Panel:     Component Value Date/Time   CHOL 162 11/30/2016 0329   TRIG 175 (H) 11/30/2016 0329   HDL 42 11/30/2016 0329   CHOLHDL 3.9 11/30/2016 0329   VLDL 35 11/30/2016 0329   LDLCALC 85 11/30/2016 0329   HgbA1c:  Lab Results  Component Value Date   HGBA1C 5.8 (H) 11/30/2016   Urine Drug Screen: No results found for: LABOPIA, COCAINSCRNUR, LABBENZ, AMPHETMU, THCU, LABBARB  Alcohol Level No results found for: [place] I have personally reviewed the radiological images below and agree with the radiology interpretations.  Ct Head Wo Contrast 11/29/2016 0906 Hypodensity left temporal lobe consistent with acute infarct. Hyperdense left MCA compatible with thrombus or embolus. Aspects score 8  Ct Angio Head W Or Wo Contrast Ct Angio Neck W Or Wo Contrast Ct Cerebral  Perfusion W Contrast 11/29/2016 Left M1 occlusion due to acute thrombus or embolus. Hypodensity left lower temporal lobe compatible with acute infarct. There is significant penumbra in the left temporoparietal lobe. Irregular noncalcified plaque left internal carotid artery could be a source of emboli or not the high non stenotic atherosclerotic disease. No significant stenosis in the carotid or vertebral arteries in the neck. Atherosclerotic calcification in the cavernous carotid bilaterally.   Ir Percutaneous Art Thrombectomy/infusion Intracranial Inc Diag Angio Ir Angio Intra Extracran Sel Com Carotid Innominate Uni L Mod Sed Ir Angio Vertebral Sel Subclavian Innominate Uni R Mod Sed 11/29/2016 Status post endovascular complete revascularization of occluded left middle cerebral artery with 1 pass with the Solitaire FR 4 mm x 40 mm retrieval device, with achievement of a TICI 3 reperfusion. Groin puncture to initial reperfusion TICI2b 32 minutes. Groin puncture to TICI reperfusion 38 minutes     Ct Head Wo Contrast 11/29/2016 Large area of hemorrhage and extravasated contrast in the left basal ganglia, blood volume 39 mL. Hemorrhagic transformation of left temporal infarct. Mild amount of subdural hematoma along the tentorium bilaterally. 6 mm midline shift to the right.   MRI head 11/30/2016 1. Left MCA infarct with the most confluent diffusion restriction in the left temporal  lobe. Superimposed 5.5 cm intra-axial hemorrhage centered at the left basal ganglia with intra-axial blood volume estimated 26 mL. Associated left hemisphere edema.  2. Rightward midline shift of 7 mm.  Basilar cisterns remain patent. 3. Effaced left lateral ventricle and trace intraventricular hemorrhage without ventriculomegaly. 4. Occasional small foci of restricted diffusion also at the left PCA territory.  LE venous doppler - no DVT  TTE - Normal LV size with EF 55-60%. Moderate diastolic dysfunction.   Normal RV  size and systolic function. Mild pulmonary   hypertension. Mild mitral regurgitation.   PHYSICAL EXAM  Temp:  [97.6 F (36.4 C)-98.4 F (36.9 C)] 97.8 F (36.6 C) (05/16 1323) Pulse Rate:  [37-114] 69 (05/16 1323) Resp:  [14-26] 18 (05/16 1323) BP: (86-162)/(54-86) 149/70 (05/16 1323) SpO2:  [95 %-100 %] 99 % (05/16 1323)  General - Well nourished, well developed, in no apparent distress.  Ophthalmologic - Fundi not visualized due to noncooperation.  Cardiovascular - Regular rate and rhythm.  Mental Status -  Awake alert, orientated to self and age, but not to time or people. Moderate expressive aphasia, able to follow commands, naming 2/4 and difficulty with repetition.  Cranial Nerves II - XII - II - Visual field intact OU. III, IV, VI - Extraocular movements intact. V - Facial sensation intact bilaterally. VII - right facial droop. VIII - Hearing & vestibular intact bilaterally. X - Palate elevates symmetrically. XI - Chin turning & shoulder shrug intact bilaterally. XII - Tongue protrusion intact.  Motor Strength - The patient's strength was normal in LUE and LLE, but RUE proximal 3-/5, tricep 2/5, and bicep and finger movement 0/5, RLE 4/5 proximal and 3/5 distally.  Bulk was normal and fasciculations were absent.   Motor Tone - Muscle tone was assessed at the neck and appendages and was normal.  Reflexes - The patient's reflexes were 1+ in all extremities and she had no pathological reflexes.  Sensory - Light touch, temperature/pinprick were assessed and were symmetrical.    Coordination - The patient had normal movements in the left hand with no ataxia or dysmetria.  Tremor was absent.  Gait and Station - deferred.   ASSESSMENT/PLAN Valerie Mcclure is a 76 y.o. female with history of chronic low back pain, HTN, HLD, hypothyroidism presenting with aphasia and right hemiplegia on awakening. She did not receive IV t-PA due to delay in arrival. Taken to  interventional neuroradiology was she received to [facility] revascularization of the occluded left MCA with mechanical thrombectomy. Patient with post IR hemorrhage.  Stroke:   Left MCA infarct s/p IR with TICI3 revascularization of occluded L MCA. Post IR large L basal ganglia hemorrhage w/ hemorrhagic transforation in L temporal infarct. infarct embolic secondary to unknown source  Resultant   right hemiplegia, expressive aphasia  CT head - left temporal lobe hypodensity. Hyperdense left MCA. Aspects 8  CTA H and N L M1 occlusion. Left temporal lobe hypodensity. L ICA irregular plaque. Bilateral carotid atherosclerosis  Cerebral angiogram occluded L MCA with eventual TICI3 revascularization   CT perfusion significant penumbra  Post IR CT hemorrhage at left basal ganglia. Hemorrhagic transformation. Left temporal infarct. Subdural hematoma, bilateral tentorium. 6 mm rightward midline shift.   MRI  L MCA infarct. L BG hemorrhage.   2D Echo EF 55-60%  LE venous doppler - no DVT  Recommend TEE and loop recorder Friday.   LDL 85  HgbA1c 5.8  SCDs for VTE prophylaxis DIET - DYS 1 Room service appropriate?  Yes; Fluid consistency: Honey Thick Diet NPO time specified  No antithrombotic prior to admission, now on No antithrombotic due to [place]  Ongoing aggressive stroke risk factor management  Therapy recommendations:  pending   Disposition:  pending   Acute respiratory failure   Intubated for neuro intervention   extubated now and tolerating well   Hypertension  Currently stable    Home meds: Lopressor, Procardia, diovan  BP goal normotensive.  Hyperlipidemia  Home meds:  No statin  LDL 85, goal < 70  On zocor 20  Continue statin on discharge.   Other Stroke Risk Factors  Advanced age  Obesity, Body mass index is 31.05 kg/m., recommend weight loss, diet and exercise as appropriate   Other Active Problems  Chronic low back pain, on morphine  Anemia with hx  multiple transfusions  On estrace  Daily  On prozac. Pamelor  On lasix  Hypothyroid, on Synthroid  Hospital day # 3  This patient is critically ill due to left M1 occlusion s/p IR, ICH, hemorrhagic transformation, HTN and at significant risk of neurological worsening, death form recurrent stroke, hemorrhage, heart failure. This patient's care requires constant monitoring of vital signs, hemodynamics, respiratory and cardiac monitoring, review of multiple databases, neurological assessment, discussion with family, other specialists and medical decision making of high complexity. I spent 45 minutes of neurocritical care time in the care of this patient.  Rosalin Hawking, MD PhD Stroke Neurology 12/02/2016 5:30 PM   To contact Stroke Continuity provider, please refer to [email or web address] After hours, contact General Neurology

## 2016-12-02 NOTE — Progress Notes (Signed)
Pt arrived to 5M22 via bed.  Pt alert and oriented. In no apparent distress, complaints of pain in stomach.  Will continue to monitor.  Cori Razor, RN

## 2016-12-02 NOTE — Progress Notes (Signed)
Rehab admissions - I met briefly with patient and her daughter.  I gave them rehab booklets.  I will contact [place] to begin preauthorization for possible acute inpatient rehab admission.  I will follow up in am.  Call me for questions.  #[phone]

## 2016-12-03 ENCOUNTER — Inpatient Hospital Stay (HOSPITAL_COMMUNITY)
Admission: RE | Admit: 2016-12-03 | Discharge: 2016-12-19 | DRG: 056 | Disposition: A | Discharge status: Other Acute Inpatient Hospital | Payer: Medicare Other | Source: Intra-hospital | Attending: Physical Medicine & Rehabilitation | Admitting: Physical Medicine & Rehabilitation

## 2016-12-03 ENCOUNTER — Encounter (HOSPITAL_COMMUNITY): Payer: Self-pay | Admitting: *Deleted

## 2016-12-03 DIAGNOSIS — R0689 Other abnormalities of breathing: Secondary | ICD-10-CM

## 2016-12-03 DIAGNOSIS — B9620 Unspecified Escherichia coli [E. coli] as the cause of diseases classified elsewhere: Secondary | ICD-10-CM | POA: Diagnosis not present

## 2016-12-03 DIAGNOSIS — M545 Low back pain: Secondary | ICD-10-CM | POA: Diagnosis not present

## 2016-12-03 DIAGNOSIS — D638 Anemia in other chronic diseases classified elsewhere: Secondary | ICD-10-CM

## 2016-12-03 DIAGNOSIS — D649 Anemia, unspecified: Secondary | ICD-10-CM | POA: Diagnosis present

## 2016-12-03 DIAGNOSIS — I5030 Unspecified diastolic (congestive) heart failure: Secondary | ICD-10-CM | POA: Diagnosis present

## 2016-12-03 DIAGNOSIS — E039 Hypothyroidism, unspecified: Secondary | ICD-10-CM | POA: Diagnosis not present

## 2016-12-03 DIAGNOSIS — G629 Polyneuropathy, unspecified: Secondary | ICD-10-CM | POA: Diagnosis present

## 2016-12-03 DIAGNOSIS — N390 Urinary tract infection, site not specified: Secondary | ICD-10-CM | POA: Diagnosis not present

## 2016-12-03 DIAGNOSIS — R4701 Aphasia: Secondary | ICD-10-CM

## 2016-12-03 DIAGNOSIS — I638 Other cerebral infarction: Secondary | ICD-10-CM

## 2016-12-03 DIAGNOSIS — G9340 Encephalopathy, unspecified: Secondary | ICD-10-CM | POA: Diagnosis not present

## 2016-12-03 DIAGNOSIS — I6389 Other cerebral infarction: Secondary | ICD-10-CM

## 2016-12-03 DIAGNOSIS — N184 Chronic kidney disease, stage 4 (severe): Secondary | ICD-10-CM | POA: Diagnosis present

## 2016-12-03 DIAGNOSIS — M8580 Other specified disorders of bone density and structure, unspecified site: Secondary | ICD-10-CM | POA: Diagnosis not present

## 2016-12-03 DIAGNOSIS — R7303 Prediabetes: Secondary | ICD-10-CM | POA: Diagnosis present

## 2016-12-03 DIAGNOSIS — F329 Major depressive disorder, single episode, unspecified: Secondary | ICD-10-CM | POA: Diagnosis present

## 2016-12-03 DIAGNOSIS — I639 Cerebral infarction, unspecified: Secondary | ICD-10-CM

## 2016-12-03 DIAGNOSIS — I130 Hypertensive heart and chronic kidney disease with heart failure and stage 1 through stage 4 chronic kidney disease, or unspecified chronic kidney disease: Secondary | ICD-10-CM | POA: Diagnosis present

## 2016-12-03 DIAGNOSIS — I69320 Aphasia following cerebral infarction: Secondary | ICD-10-CM | POA: Diagnosis not present

## 2016-12-03 DIAGNOSIS — Z880 Allergy status to penicillin: Secondary | ICD-10-CM | POA: Diagnosis not present

## 2016-12-03 DIAGNOSIS — E8809 Other disorders of plasma-protein metabolism, not elsewhere classified: Secondary | ICD-10-CM | POA: Diagnosis present

## 2016-12-03 DIAGNOSIS — G8191 Hemiplegia, unspecified affecting right dominant side: Secondary | ICD-10-CM | POA: Diagnosis not present

## 2016-12-03 DIAGNOSIS — G8929 Other chronic pain: Secondary | ICD-10-CM | POA: Diagnosis present

## 2016-12-03 DIAGNOSIS — E785 Hyperlipidemia, unspecified: Secondary | ICD-10-CM | POA: Diagnosis not present

## 2016-12-03 DIAGNOSIS — I63512 Cerebral infarction due to unspecified occlusion or stenosis of left middle cerebral artery: Secondary | ICD-10-CM | POA: Diagnosis present

## 2016-12-03 DIAGNOSIS — R0902 Hypoxemia: Secondary | ICD-10-CM | POA: Diagnosis not present

## 2016-12-03 DIAGNOSIS — I69351 Hemiplegia and hemiparesis following cerebral infarction affecting right dominant side: Secondary | ICD-10-CM | POA: Diagnosis not present

## 2016-12-03 DIAGNOSIS — Z79899 Other long term (current) drug therapy: Secondary | ICD-10-CM | POA: Diagnosis not present

## 2016-12-03 DIAGNOSIS — N1832 Chronic kidney disease, stage 3b: Secondary | ICD-10-CM | POA: Diagnosis present

## 2016-12-03 DIAGNOSIS — N183 Chronic kidney disease, stage 3 (moderate): Secondary | ICD-10-CM | POA: Diagnosis not present

## 2016-12-03 DIAGNOSIS — I10 Essential (primary) hypertension: Secondary | ICD-10-CM | POA: Diagnosis not present

## 2016-12-03 DIAGNOSIS — Z888 Allergy status to other drugs, medicaments and biological substances status: Secondary | ICD-10-CM | POA: Diagnosis not present

## 2016-12-03 DIAGNOSIS — R1310 Dysphagia, unspecified: Secondary | ICD-10-CM | POA: Diagnosis present

## 2016-12-03 DIAGNOSIS — Z90710 Acquired absence of both cervix and uterus: Secondary | ICD-10-CM | POA: Diagnosis not present

## 2016-12-03 DIAGNOSIS — I69391 Dysphagia following cerebral infarction: Secondary | ICD-10-CM | POA: Diagnosis not present

## 2016-12-03 DIAGNOSIS — Z91041 Radiographic dye allergy status: Secondary | ICD-10-CM | POA: Diagnosis not present

## 2016-12-03 DIAGNOSIS — Z91013 Allergy to seafood: Secondary | ICD-10-CM

## 2016-12-03 DIAGNOSIS — R4020 Unspecified coma: Secondary | ICD-10-CM

## 2016-12-03 DIAGNOSIS — N189 Chronic kidney disease, unspecified: Secondary | ICD-10-CM | POA: Diagnosis not present

## 2016-12-03 DIAGNOSIS — I491 Atrial premature depolarization: Secondary | ICD-10-CM

## 2016-12-03 DIAGNOSIS — T40601A Poisoning by unspecified narcotics, accidental (unintentional), initial encounter: Secondary | ICD-10-CM | POA: Diagnosis not present

## 2016-12-03 LAB — CBC
HCT: 29.4 % — ABNORMAL LOW (ref 36.0–46.0)
Hemoglobin: 9.1 g/dL — ABNORMAL LOW (ref 12.0–15.0)
MCH: 28.6 pg (ref 26.0–34.0)
MCHC: 31 g/dL (ref 30.0–36.0)
MCV: 92.5 fL (ref 78.0–100.0)
PLATELETS: 199 10*3/uL (ref 150–400)
RBC: 3.18 MIL/uL — ABNORMAL LOW (ref 3.87–5.11)
RDW: 15.5 % (ref 11.5–15.5)
WBC: 9.7 10*3/uL (ref 4.0–10.5)

## 2016-12-03 LAB — BASIC METABOLIC PANEL
Anion gap: 7 (ref 5–15)
BUN: 17 mg/dL (ref 6–20)
CALCIUM: 8.1 mg/dL — AB (ref 8.9–10.3)
CO2: 21 mmol/L — ABNORMAL LOW (ref 22–32)
CREATININE: 1.07 mg/dL — AB (ref 0.44–1.00)
Chloride: 117 mmol/L — ABNORMAL HIGH (ref 101–111)
GFR calc Af Amer: 57 mL/min — ABNORMAL LOW (ref >60)
GFR, EST NON AFRICAN AMERICAN: 49 mL/min — AB (ref >60)
GLUCOSE: 125 mg/dL — AB (ref 65–99)
Potassium: 3.8 mmol/L (ref 3.5–5.1)
SODIUM: 145 mmol/L (ref 135–145)

## 2016-12-03 MED ORDER — ALUM & MAG HYDROXIDE-SIMETH 200-200-20 MG/5ML PO SUSP: 30.0000 mL | ORAL | Status: DC | PRN

## 2016-12-03 MED ORDER — ACETAMINOPHEN 325 MG PO TABS
325.0000 mg | ORAL_TABLET | ORAL | Status: DC | PRN
  Administered 2016-12-13: 650 mg via ORAL
  Filled 2016-12-03: qty 2

## 2016-12-03 MED ORDER — NORTRIPTYLINE HCL 10 MG PO CAPS
20.0000 mg | ORAL_CAPSULE | Freq: Every day | ORAL | Status: DC
  Administered 2016-12-03 – 2016-12-18 (×16): 20 mg via ORAL
  Filled 2016-12-03 (×17): qty 2

## 2016-12-03 MED ORDER — FLEET ENEMA 7-19 GM/118ML RE ENEM
1.0000 | ENEMA | Freq: Once | RECTAL | Status: AC | PRN
  Administered 2016-12-19: 1 via RECTAL
  Filled 2016-12-03: qty 1

## 2016-12-03 MED ORDER — ASPIRIN EC 325 MG PO TBEC
325.0000 mg | DELAYED_RELEASE_TABLET | Freq: Every day | ORAL | Status: DC
  Administered 2016-12-11 – 2016-12-19 (×9): 325 mg via ORAL
  Filled 2016-12-03 (×9): qty 1

## 2016-12-03 MED ORDER — SIMVASTATIN 20 MG PO TABS
20.0000 mg | ORAL_TABLET | Freq: Every day | ORAL | Status: DC
  Administered 2016-12-03 – 2016-12-18 (×16): 20 mg via ORAL
  Filled 2016-12-03 (×16): qty 1

## 2016-12-03 MED ORDER — BISACODYL 10 MG RE SUPP: 10.0000 mg | Freq: Every day | RECTAL | Status: DC | PRN

## 2016-12-03 MED ORDER — METOPROLOL TARTRATE 25 MG PO TABS
25.0000 mg | ORAL_TABLET | Freq: Two times a day (BID) | ORAL | Status: DC
  Administered 2016-12-03 – 2016-12-19 (×29): 25 mg via ORAL
  Filled 2016-12-03 (×32): qty 1

## 2016-12-03 MED ORDER — MORPHINE SULFATE ER 15 MG PO TBCR
15.0000 mg | EXTENDED_RELEASE_TABLET | Freq: Two times a day (BID) | ORAL | Status: DC
  Administered 2016-12-03 – 2016-12-05 (×4): 15 mg via ORAL
  Filled 2016-12-03 (×5): qty 1

## 2016-12-03 MED ORDER — IPRATROPIUM-ALBUTEROL 0.5-2.5 (3) MG/3ML IN SOLN: 3.0000 mL | Freq: Four times a day (QID) | RESPIRATORY_TRACT | Status: DC | PRN

## 2016-12-03 MED ORDER — LEVOTHYROXINE SODIUM 75 MCG PO TABS
75.0000 ug | ORAL_TABLET | Freq: Every day | ORAL | Status: DC
  Administered 2016-12-04 – 2016-12-19 (×16): 75 ug via ORAL
  Filled 2016-12-03 (×16): qty 1

## 2016-12-03 MED ORDER — PANTOPRAZOLE SODIUM 40 MG PO TBEC
40.0000 mg | DELAYED_RELEASE_TABLET | Freq: Every day | ORAL | Status: DC
  Administered 2016-12-03 – 2016-12-04 (×2): 40 mg via ORAL
  Filled 2016-12-03 (×3): qty 1

## 2016-12-03 MED ORDER — IRBESARTAN 150 MG PO TABS
75.0000 mg | ORAL_TABLET | Freq: Every day | ORAL | Status: DC
  Administered 2016-12-03: 75 mg via ORAL
  Filled 2016-12-03: qty 1

## 2016-12-03 MED ORDER — POTASSIUM CHLORIDE CRYS ER 20 MEQ PO TBCR
40.0000 meq | EXTENDED_RELEASE_TABLET | ORAL | Status: AC
  Administered 2016-12-03 – 2016-12-04 (×3): 40 meq via ORAL
  Filled 2016-12-03 (×3): qty 2

## 2016-12-03 MED ORDER — DIPHENHYDRAMINE HCL 12.5 MG/5ML PO ELIX: 12.5000 mg | ORAL_SOLUTION | Freq: Four times a day (QID) | ORAL | Status: DC | PRN

## 2016-12-03 MED ORDER — QUETIAPINE FUMARATE 25 MG PO TABS
25.0000 mg | ORAL_TABLET | Freq: Four times a day (QID) | ORAL | Status: DC | PRN
  Administered 2016-12-03 – 2016-12-17 (×6): 25 mg via ORAL
  Filled 2016-12-03 (×7): qty 1

## 2016-12-03 MED ORDER — RESOURCE THICKENUP CLEAR PO POWD
ORAL | Status: DC | PRN
  Filled 2016-12-03: qty 125

## 2016-12-03 MED ORDER — PROCHLORPERAZINE MALEATE 5 MG PO TABS: 5.0000 mg | ORAL_TABLET | Freq: Four times a day (QID) | ORAL | Status: DC | PRN

## 2016-12-03 MED ORDER — FLUOXETINE HCL 20 MG PO CAPS
40.0000 mg | ORAL_CAPSULE | Freq: Every day | ORAL | Status: DC
Start: 2016-12-04 — End: 2016-12-19
  Administered 2016-12-04 – 2016-12-19 (×16): 40 mg via ORAL
  Filled 2016-12-03 (×16): qty 2

## 2016-12-03 MED ORDER — POLYETHYLENE GLYCOL 3350 17 G PO PACK: 17.0000 g | PACK | Freq: Every day | ORAL | Status: DC | PRN

## 2016-12-03 MED ORDER — GUAIFENESIN-DM 100-10 MG/5ML PO SYRP: 5.0000 mL | ORAL_SOLUTION | Freq: Four times a day (QID) | ORAL | Status: DC | PRN

## 2016-12-03 MED ORDER — PROCHLORPERAZINE EDISYLATE 5 MG/ML IJ SOLN: 5.0000 mg | Freq: Four times a day (QID) | INTRAMUSCULAR | Status: DC | PRN

## 2016-12-03 MED ORDER — PROCHLORPERAZINE 25 MG RE SUPP: 12.5000 mg | Freq: Four times a day (QID) | RECTAL | Status: DC | PRN

## 2016-12-03 MED ORDER — VITAMIN B-12 100 MCG PO TABS
100.0000 ug | ORAL_TABLET | Freq: Every day | ORAL | Status: DC
  Administered 2016-12-04 – 2016-12-19 (×16): 100 ug via ORAL
  Filled 2016-12-03 (×18): qty 1

## 2016-12-03 MED ORDER — TRAZODONE HCL 50 MG PO TABS: 25.0000 mg | ORAL_TABLET | Freq: Every evening | ORAL | Status: DC | PRN

## 2016-12-03 MED ORDER — IRBESARTAN 75 MG PO TABS
75.0000 mg | ORAL_TABLET | Freq: Every day | ORAL | Status: DC
  Administered 2016-12-04 – 2016-12-08 (×5): 75 mg via ORAL
  Filled 2016-12-03 (×5): qty 1

## 2016-12-03 NOTE — Clinical Social Work Note (Signed)
Clinical Social Work Assessment  Patient Details  Name: Valerie Mcclure MRN: [id number] Date of Birth: [birth year]  Date of referral:  12/03/16               Reason for consult:  Facility Placement, Discharge Planning                Permission sought to share information with:  Facility Sport and exercise psychologist, Family Supports Permission granted to share information::  Yes, Verbal Permission Granted  Name::     [facility] Lanius, [name]  Agency::  SNF  Relationship::  Spouse, Daughters  Contact Information:     Housing/Transportation Living arrangements for the past 2 months:  Single Family Home Source of Information:  Patient, Adult Children Patient Interpreter Needed:  None Criminal Activity/Legal Involvement Pertinent to Current Situation/Hospitalization:  No - Comment as needed Significant Relationships:  Adult Children, Spouse Lives with:  Self, Spouse Do you feel safe going back to the place where you live?  Yes Need for family participation in patient care:  No (Coment)  Care giving concerns:  Pt currently resides at home with spouse. Prior to hospital admission, pt was independent at home, but has increased weakness from stroke and family is concerned about her ability to care for her own needs upon discharge.   Social Worker assessment / plan:  CSW explained recommendation for [facility] and insurance approval process to pt and pt's daughter, and explained a SNF referral as an option just in case insurance doesn't approve CIR stay. Pt and pt's daughter confirmed recommendation and preference for CIR, but indicated understanding of exploring other options just in case. Pt's daughter expressed concern that her father didn't want the pt to go to a nursing home. CSW explained that a short-term rehab stay would not be the same as a long-term nursing home placement. Pt and pt's daughter confirmed understanding. Pt and pt's daughter requested that CSW explain to pt's husband if needed, when he  arrives at the hospital. CSW will follow up on SNF referral with family if needed.   Employment status:  Retired Nurse, adult PT Recommendations:  Inpatient [facility] / Referral to community resources:     Patient/Family's Response to care:  Pt and pt's daughter agreeable to SNF placement, if needed.  Patient/Family's Understanding of and Emotional Response to Diagnosis, Current Treatment, and Prognosis:  Pt appeared to be frustrated with her current level of functioning. Pt has aphasia and was struggling to communicate what she wanted, but was able to indicate understanding of what was being discussed and agreed to pursue a SNF placement, if needed. Pt's daughter was appreciative of the support and assistance in explaining the placement options. Pt and pt's daughter both indicated understanding of CSW role in discharge planning process.  Emotional Assessment Appearance:  Appears stated age Attitude/Demeanor/Rapport:    Affect (typically observed):  Appropriate Orientation:  Oriented to Self, Oriented to Place, Oriented to  Time, Oriented to Situation Alcohol / Substance use:  Not Applicable Psych involvement (Current and /or in the community):  No (Comment)  Discharge Needs  Concerns to be addressed:  Care Coordination, Discharge Planning Concerns Readmission within the last 30 days:  No Current discharge risk:  Physical Impairment Barriers to Discharge:  Continued Medical Work up   [employer], [place] 12/03/2016, 10:28 AM

## 2016-12-03 NOTE — Discharge Summary (Signed)
Stroke Discharge Summary  Patient ID: Valerie Mcclure   MRN: [id number]      DOB: [birth year]  Date of Admission: 11/29/2016 Date of Discharge: 12/03/2016  Attending Physician:  Rosalin Hawking, MD, Stroke MD Consultant(s):  Treatment Team:  cardiology and pulmonary/intensive care Patient's PCP:  [name], MD  Discharge Diagnoses:  Active Problems:   Acute left MCA infarct s/p mechanical thrombectomy    Nontraumatic subcortical hemorrhage of left BG hemisphere ([name])   Hemorrhagic transformation   Frequent PACs   HTN   HLD    BMI  Body mass index is 31.05 kg/m.  Past Medical History:  Diagnosis Date  . Blood transfusion without reported diagnosis   . Chronic low back pain   . Hyperlipidemia   . Osteopenia   . Unspecified essential hypertension   . Unspecified hypothyroidism    Past Surgical History:  Procedure Laterality Date  . ABDOMINAL HYSTERECTOMY  1970  . CHOLECYSTECTOMY  07/2009   Dr. Rise Patience  . COLONOSCOPY  2003  . FLEXIBLE SIGMOIDOSCOPY  2010  . HAND SURGERY    . IR ANGIO INTRA EXTRACRAN SEL COM CAROTID INNOMINATE UNI L MOD SED  11/29/2016  . IR ANGIO VERTEBRAL SEL SUBCLAVIAN INNOMINATE UNI R MOD SED  11/29/2016  . IR PERCUTANEOUS ART THROMBECTOMY/INFUSION INTRACRANIAL INC DIAG ANGIO  11/29/2016  . LUMBAR LAMINECTOMY  11/2008   Done by Dr. Patrice Paradise  . RADIOLOGY WITH ANESTHESIA N/A 11/29/2016   Procedure: RADIOLOGY WITH ANESTHESIA;  Surgeon: Radiologist, Medication, MD;  Location: [facility];  Service: Radiology;  Laterality: N/A;  . THYROIDECTOMY      Medications to be continued on Rehab .  stroke: mapping our early stages of recovery book   Does not apply Once  . FLUoxetine  40 mg Oral Daily  . levothyroxine  75 mcg Oral QAC breakfast  . metoprolol tartrate  25 mg Oral BID  . morphine  15 mg Oral Q12H  . nortriptyline  20 mg Oral QHS  . pantoprazole  40 mg Oral Daily  . pneumococcal 23 valent vaccine  0.5 mL Intramuscular Tomorrow-1000  . simvastatin  20  mg Oral q1800  . vitamin B-12  100 mcg Oral Daily    LABORATORY STUDIES CBC    Component Value Date/Time   WBC 9.7 12/03/2016 0518   RBC 3.18 (L) 12/03/2016 0518   HGB 9.1 (L) 12/03/2016 0518   HCT 29.4 (L) 12/03/2016 0518   PLT 199 12/03/2016 0518   MCV 92.5 12/03/2016 0518   MCH 28.6 12/03/2016 0518   MCHC 31.0 12/03/2016 0518   RDW 15.5 12/03/2016 0518   LYMPHSABS 1.4 11/30/2016 0329   MONOABS 0.6 11/30/2016 0329   EOSABS 0.0 11/30/2016 0329   BASOSABS 0.0 11/30/2016 0329   CMP    Component Value Date/Time   NA 145 12/03/2016 0518   K 3.8 12/03/2016 0518   CL 117 (H) 12/03/2016 0518   CO2 21 (L) 12/03/2016 0518   GLUCOSE 125 (H) 12/03/2016 0518   BUN 17 12/03/2016 0518   CREATININE 1.07 (H) 12/03/2016 0518   CALCIUM 8.1 (L) 12/03/2016 0518   PROT 6.5 11/29/2016 0834   ALBUMIN 3.4 (L) 11/29/2016 0834   AST 29 11/29/2016 0834   ALT 17 11/29/2016 0834   ALKPHOS 56 11/29/2016 0834   BILITOT 0.3 11/29/2016 0834   GFRNONAA 49 (L) 12/03/2016 0518   GFRAA 57 (L) 12/03/2016 0518   COAGS Lab Results  Component Value Date   INR  1.01 11/29/2016   INR 1.2 12/26/2008   Lipid Panel    Component Value Date/Time   CHOL 162 11/30/2016 0329   TRIG 175 (H) 11/30/2016 0329   HDL 42 11/30/2016 0329   CHOLHDL 3.9 11/30/2016 0329   VLDL 35 11/30/2016 0329   LDLCALC 85 11/30/2016 0329   HgbA1C  Lab Results  Component Value Date   HGBA1C 5.8 (H) 11/30/2016   Urinalysis    Component Value Date/Time   COLORURINE YELLOW 11/18/2010 0859   APPEARANCEUR [place] 11/18/2010 0859   LABSPEC 1.010 11/18/2010 0859   PHURINE 7.0 11/18/2010 0859   GLUCOSEU NEGATIVE 11/18/2010 0859   HGBUR NEGATIVE 11/18/2010 0859   HGBUR negative 12/13/2008 0944   BILIRUBINUR NEGATIVE 11/18/2010 0859   KETONESUR NEGATIVE 11/18/2010 0859   PROTEINUR NEGATIVE 01/06/2009 1732   UROBILINOGEN 0.2 11/18/2010 0859   NITRITE NEGATIVE 11/18/2010 0859   LEUKOCYTESUR NEGATIVE 11/18/2010 0859   Urine  Drug Screen No results found for: LABOPIA, COCAINSCRNUR, LABBENZ, AMPHETMU, THCU, LABBARB  Alcohol Level No results found for: [place] I have personally reviewed the radiological images below and agree with the radiology interpretations.  Ct Head Wo Contrast 11/29/2016 0906 Hypodensity left temporal lobe consistent with acute infarct. Hyperdense left MCA compatible with thrombus or embolus. Aspects score 8  Ct Angio Head W Or Wo Contrast Ct Angio Neck W Or Wo Contrast Ct Cerebral Perfusion W Contrast 11/29/2016 Left M1 occlusion due to acute thrombus or embolus. Hypodensity left lower temporal lobe compatible with acute infarct. There is significant penumbra in the left temporoparietal lobe. Irregular noncalcified plaque left internal carotid artery could be a source of emboli or not the high non stenotic atherosclerotic disease. No significant stenosis in the carotid or vertebral arteries in the neck. Atherosclerotic calcification in the cavernous carotid bilaterally.   Cerebral angio 11/29/2016 Status post endovascular complete revascularization of occluded left middle cerebral artery with 1 pass with the Solitaire FR 4 mm x 40 mm retrieval device, with achievement of a TICI 3 reperfusion. Groin puncture to initial reperfusion TICI2b 32 minutes. Groin puncture to TICI reperfusion 38 minutes     Ct Head Wo Contrast 11/29/2016 Large area of hemorrhage and extravasated contrast in the left basal ganglia, blood volume 39 mL. Hemorrhagic transformation of left temporal infarct. Mild amount of subdural hematoma along the tentorium bilaterally. 6 mm midline shift to the right.   MRI head 11/30/2016 1. Left MCA infarct with the most confluent diffusion restriction in the left temporal lobe. Superimposed 5.5 cm intra-axial hemorrhage centered at the left basal ganglia with intra-axial blood volume estimated 26 mL. Associated left hemisphere edema.  2. Rightward  midline shift of 7 mm. Basilar cisterns remain patent. 3. Effaced left lateral ventricle and trace intraventricular hemorrhage without ventriculomegaly. 4. Occasional small foci of restricted diffusion also at the left PCA territory.  LE venous doppler - no DVT  2D Echocardiogram  - Normal LV size with EF 55-60%. Moderate diastolic dysfunction.Normal RV size and systolic function. Mild pulmonaryhypertension. Mild mitral regurgitation.     HISTORY OF PRESENT ILLNESS Valerie Mcclure an 76 y.o.female Who was last known well at 10 PM 11/28/2016. This morning she woke up with aphasia and right hemiplegia. CT showed hyperdense left MCA and hypodensity in the left temporal area. CTA confirmed left MCA clot. CTP showed CBF <30% at 5 ml and Tmax >6 sec at 52 ml for a mismatch vol of 47 ml and ratio of 10. She  has a history of HTN and high cholesterol, but no DM, smoking. Patient was not administered IV t-PA secondary to delay in arrival. She was taken to interventional neuroradiology where angiogram showed left MCA occlusion  She was admitted to the neuro ICU for further evaluation and treatment.    HOSPITAL COURSE Valerie Mcclure is a 76 y.o. female with history of chronic low back pain, HTN, HLD, hypothyroidism presenting with aphasia and right hemiplegia on awakening. She did not receive IV t-PA due to delay in arrival. Taken to interventional neuroradiology was she received to [facility] revascularization of the occluded left MCA with mechanical thrombectomy. Patient with post IR hemorrhage.  Stroke:   Left MCA infarct s/p IR with TICI3 revascularization of occluded L MCA. Post IR large L basal ganglia hemorrhage w/ hemorrhagic transforation in L temporal infarct. infarct embolic secondary to unknown source  Resultant   right hemiplegia, partial expressive aphasia  CT head - left temporal lobe hypodensity. Hyperdense left MCA. Aspects 8  CTA H and N L M1 occlusion. Left  temporal lobe hypodensity. L ICA irregular plaque. Bilateral carotid atherosclerosis  Cerebral angiogram occluded L MCA with eventual TICI3 revascularization   CT perfusion significant penumbra  Post IR CT hemorrhage at left basal ganglia. Hemorrhagic transformation. Left temporal infarct. Subdural hematoma, bilateral tentorium. 6 mm rightward midline shift.   MRI  L MCA infarct. L BG hemorrhage.   2D Echo EF 55-60%  LE venous doppler - no DVT  Tele with frequent PACs - considered TEE and loop recorder - cardiology recommends 30d monitor at discharge, which we will do. If unrevealing, consider loop at that time.   LDL 85  HgbA1c 5.8  No antithrombotic prior to admission, now on no antithrombotic due to [place]. Plan to start ASA 325 mg in 7 days (12/10/16).  Ongoing aggressive stroke risk factor management  Therapy recommendations:  pending   Disposition:  pending   Frequent PACs  Tele with frequent multifocal PACs - considered TEE and loop recorder - cardiology recommends 30d monitor at discharge, which we will do. If unrevealing, consider loop at that time.   Hypertension  Currently stable    Initially treated with Cleviprex, now off  Home meds: Lopressor, Procardia, diovan  Lopressor and diovan resumed  BP goal normotensive.  Hyperlipidemia  Home meds:  No statin  LDL 85, goal < 70  On zocor 20  Continue statin on discharge.   Other Stroke Risk Factors  Advanced age  Obesity, Body mass index is 31.05 kg/m., recommend weight loss, diet and exercise as appropriate   Other Active Problems  Chronic low back pain, on morphine  Anemia with hx multiple transfusions  On estrace  Daily  On prozac. Pamelor  On lasix  Hypothyroid, on Synthroid  DISCHARGE EXAM Blood pressure (!) 144/54, pulse 79, temperature 98.6 F (37 C), temperature source Oral, resp. rate 18, height 5\' 3"  (1.6 m), weight 79.5 kg (175 lb 4.3 oz), SpO2 97 %. General - Well  nourished, well developed, in no apparent distress.  Ophthalmologic - Fundi not visualized due to noncooperation.  Cardiovascular - Regular rate and rhythm.  Mental Status -  Awake alert, orientated to self and age, but not to time or people. Moderate expressive aphasia, able to follow commands, naming 2/4 and difficulty with repetition.  Cranial Nerves II - XII - II - Visual field intact OU. III, IV, VI - Extraocular movements intact. V - Facial sensation intact bilaterally. VII - right facial droop.  VIII - Hearing & vestibular intact bilaterally. X - Palate elevates symmetrically. XI - Chin turning & shoulder shrug intact bilaterally. XII - Tongue protrusion intact.  Motor Strength - The patient's strength was normal in LUE and LLE, but RUE proximal 3-/5, tricep 2/5, and bicep and finger movement 0/5, RLE 4/5 proximal and 3/5 distally.  Bulk was normal and fasciculations were absent.   Motor Tone - Muscle tone was assessed at the neck and appendages and was normal.  Reflexes - The patient's reflexes were 1+ in all extremities and she had no pathological reflexes.  Sensory - Light touch, temperature/pinprick were assessed and were symmetrical.    Coordination - The patient had normal movements in the left hand with no ataxia or dysmetria.  Tremor was absent.  Gait and Station - deferred.  Discharge Diet  DIET - DYS 1 Room service appropriate? Yes; Fluid consistency: Honey Thick Diet NPO time specified Diet NPO time specified Except for: Sips with Meds liquids  DISCHARGE PLAN  Disposition:  Transfer to [facility] for ongoing PT, OT and ST  No antithrombotic for secondary stroke prevention due to [place], but plan to start ASA 325mg  in 7 days.  Recommend ongoing risk factor control by Primary Care Physician at time of discharge from inpatient rehabilitation.  Follow-up [name], MD in 2 weeks following discharge from rehab.  Follow-up with Dr.  Rosalin Hawking, Stroke Clinic in 6 weeks, office to schedule an appointment.   40 minutes were spent preparing discharge.  Rosalin Hawking, MD PhD Stroke Neurology 12/03/2016 2:22 PM

## 2016-12-03 NOTE — Care Management Note (Signed)
Case Management Note  Patient Details  Name: Valerie Mcclure MRN: [id number] Date of Birth: [birth year]  Subjective/Objective:                    Action/Plan: Plan is for TEE tomorrow. Awaiting insurance decision about CIR admission after. CM following for discharge disposition.   Expected Discharge Date:                  Expected Discharge Plan:  [place]  In-House Referral:     Discharge planning Services  CM Consult  Post Acute Care Choice:    Choice offered to:     DME Arranged:    DME Agency:     HH Arranged:    [place] Agency:     Status of Service:  In process, will continue to follow  If discussed at Long Length of Stay Meetings, dates discussed:    Additional Comments:  Pollie Friar, RN 12/03/2016, 1:06 PM

## 2016-12-03 NOTE — Progress Notes (Signed)
Kirsteins, Luanna Salk, MD Physician Signed Physical Medicine and Rehabilitation  Consult Note Date of Service: 12/01/2016 9:57 AM  Related encounter: ED to Hosp-Admission (Current) from 11/29/2016 in [place] Collapse All   [] Hide copied text [] Hover for attribution information      Physical Medicine and Rehabilitation Consult  Reason for Consult: Right sided weakness, dysarthria and dysphagia Referring Physician: Dr. Nelda Marseille.   HPI: Valerie Mcclure is a 76 y.o. female with history of chronic LBP, CKD, hyperlipidemia who fell out of bed on 5/13 am and was found to have left facial weakness with slurred speech and right sided weakness. CT head done revealing hyperdense L-MCA sign with hypodensity in left temporal area. CTA showed left M1 occlusion due to acute thrombus or embolus with significant penumbra left temporoparietal lobe and irregular non-calcified plaque L-ICA. She underwent cerebral angio with complete revascularization of L-MCA with reperfusion. Follow up CCT showed hemorrhagic transformation of left temporal infarct, large area of hemorrhage and extravasated contrast in left basal ganglia,  minimal SDH along tentorium and 6 mm midline shift to right. MRI brain  L-MCA infarct with diffusion restriction left temporal lobe, superimposed 5.5 cm intra-axial hemorrhage centered at left basal ganglia with associated left hemisphere edema, effaced left lateral ventricle with trace IVH and occasional small foci of restriction in left thalamus and anterior left occipital lobe.    She was extubated 5/14 pm and therapy evaluations completed yesterday. Dr. Acie Fredrickson consulted for input on new onset A fib on telemetry. EKG without evidence of A fib and felt that patient might be having bouts of A fib due to stress v/s sinus arrhthymias. Question event monitor v/s loop to evaluate for arrhthymias.  BSS showed signs of dysphagia and concerns  for aspiration--NPO recommended. Speech evaluation revealed moderate dysarthria with expressive > receptive aphasia with phonemic paraphasias and anomia. CIR recommended for follow up therapy.    Review of Systems  Constitutional: Positive for malaise/fatigue.  HENT: Negative for hearing loss.   Eyes: Negative for blurred vision and double vision.  Respiratory: Negative for shortness of breath.   Cardiovascular: Negative for chest pain and palpitations.  Musculoskeletal: Positive for back pain and myalgias.  Neurological: Positive for speech change, focal weakness and weakness. Negative for dizziness and headaches.  Psychiatric/Behavioral: Positive for memory loss.          Past Medical History:  Diagnosis Date  . Blood transfusion without reported diagnosis   . Chronic low back pain   . Hyperlipidemia   . Osteopenia   . Unspecified essential hypertension   . Unspecified hypothyroidism          Past Surgical History:  Procedure Laterality Date  . ABDOMINAL HYSTERECTOMY  1970  . CHOLECYSTECTOMY  07/2009   Dr. Rise Patience  . COLONOSCOPY  2003  . FLEXIBLE SIGMOIDOSCOPY  2010  . HAND SURGERY    . IR ANGIO INTRA EXTRACRAN SEL COM CAROTID INNOMINATE UNI L MOD SED  11/29/2016  . IR ANGIO VERTEBRAL SEL SUBCLAVIAN INNOMINATE UNI R MOD SED  11/29/2016  . IR PERCUTANEOUS ART THROMBECTOMY/INFUSION INTRACRANIAL INC DIAG ANGIO  11/29/2016  . LUMBAR LAMINECTOMY  11/2008   Done by Dr. Patrice Paradise  . RADIOLOGY WITH ANESTHESIA N/A 11/29/2016   Procedure: RADIOLOGY WITH ANESTHESIA;  Surgeon: Radiologist, Medication, MD;  Location: [facility];  Service: Radiology;  Laterality: N/A;  . THYROIDECTOMY           Family History  Problem Relation Age of Onset  . Heart disease Father 71       MI age 34s  . Lung cancer Brother 48  . Colon cancer Neg Hx   . Esophageal cancer Neg Hx   . Rectal cancer Neg Hx   . Stomach cancer Neg Hx     Social History:  Married.  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 Her husband had heart attack last week. Daughter lives with them and assists as needed. She reports that she has never smoked. She has never used smokeless tobacco. She reports that she does not drink alcohol or use drugs.         Allergies  Allergen Reactions  . Shrimp [Shellfish Allergy]     Break outs and swelling   . Tandem Plus [Fefum-Fepo-Fa-B Cmp-C-Zn-Mn-Cu] Nausea And Vomiting  . Ivp Dye [Iodinated Diagnostic Agents] Rash    itching  . Penicillins Rash    itching          Medications Prior to Admission  Medication Sig Dispense Refill  . estradiol (ESTRACE) 0.5 MG tablet TAKE 1 TABLET(0.5 MG) BY MOUTH DAILY 90 tablet 2  . FLUoxetine (PROZAC) 20 MG capsule Take 1 capsule (20 mg total) by mouth daily. (Patient taking differently: Take 40 mg by mouth daily. ) 30 capsule 11  . furosemide (LASIX) 20 MG tablet TAKE 1 TABLET BY MOUTH DAILY AS NEEDED 90 tablet 1  . levothyroxine (SYNTHROID, LEVOTHROID) 75 MCG tablet Take 75 mcg by mouth daily before breakfast.    . metoprolol tartrate (LOPRESSOR) 25 MG tablet TAKE 1 TABLET(25 MG) BY MOUTH TWICE DAILY 180 tablet 2  . morphine (MS CONTIN) 15 MG 12 hr tablet Take 15 mg by mouth every 12 (twelve) hours.     Marland Kitchen NIFEdipine (PROCARDIA-XL/ADALAT-CC/NIFEDICAL-XL) 30 MG 24 hr tablet Take 30 mg by mouth daily.    . nortriptyline (PAMELOR) 10 MG capsule TAKE 2 CAPSULES BY MOUTH AT BEDTIME 180 capsule 1  . simvastatin (ZOCOR) 10 MG tablet TAKE 1 TABLET(10 MG) BY MOUTH DAILY 90 tablet 3  . valsartan (DIOVAN) 160 MG tablet Take 1 tablet (160 mg total) by mouth daily. 90 tablet 3  . vitamin B-12 (CYANOCOBALAMIN) 100 MCG tablet Take 100 mcg by mouth daily.    . Calcium Carbonate-Vit D-Min (CALCIUM 1200 PO) Take by mouth daily.      . Cholecalciferol (VITAMIN D3) 1000 UNITS CAPS Take by mouth daily.      Marland Kitchen levothyroxine (SYNTHROID, LEVOTHROID) 88 MCG tablet Take 1 tablet (88 mcg total) by mouth  daily. 90 tablet 1    Home: Home Living Family/patient expects to be discharged to:: Private residence Living Arrangements: Spouse/significant other, Children  Functional History: Functional Status:  Mobility:  ADL:  Cognition: Cognition Orientation Level: Oriented X4  Blood pressure (!) 139/54, pulse 80, temperature 98.1 F (36.7 C), temperature source Oral, resp. rate (!) 21, height 5\' 3"  (1.6 m), weight 79.5 kg (175 lb 4.3 oz), SpO2 100 %. Physical Exam  Nursing note and vitals reviewed. Constitutional: She appears well-developed and well-nourished. She is easily aroused. No distress. Nasal cannula in place.  Sleeping but easily aroused-- yawning frequently.   HENT:  Head: Normocephalic and atraumatic.  Eyes: Conjunctivae are normal. Pupils are equal, round, and reactive to light.  Neck: Normal range of motion. Neck supple.  Cardiovascular: Normal rate and regular rhythm.   Respiratory: Effort normal and breath sounds normal. No stridor. No respiratory distress. She has no wheezes.  GI: Soft. Bowel  sounds are normal. She exhibits no distension. There is no tenderness.  Musculoskeletal:  Edema right hand.   Neurological: She is easily aroused.  Sleepy but able to follow simple one step motor commands most of the time. Right facial weakness with dysarthria. Y/N biographic question were 50% accurate as daughter corrected frequently. RUE> RLE weakness.    Skin: Skin is warm and dry. She is not diaphoretic.  Psychiatric: She has a normal mood and affect. Her speech is slurred. She is slowed. Cognition and memory are impaired.  Fluent aphasia, Wernicke's, unable to repeat words Right  Motor strength is 3 minus at the finger flexors 3 minus at the biceps and triceps, 0 at the deltoid, 0 at the finger extensors, 3 plus in the right hip flexor, knee extensor, ankle dorsiflexor Left upper and lower extremity strength  Normal   Lab Results Last 24 Hours       Results for  orders placed or performed during the hospital encounter of 11/29/16 (from the past 24 hour(s))  T4, free     Status: Abnormal   Collection Time: 11/30/16  1:46 PM  Result Value Ref Range   Free T4 1.36 (H) 0.61 - 1.12 ng/dL  TSH     Status: None   Collection Time: 11/30/16  1:46 PM  Result Value Ref Range   TSH 1.076 0.350 - 4.500 uIU/mL  CBC     Status: Abnormal   Collection Time: 12/01/16  2:29 AM  Result Value Ref Range   WBC 17.3 (H) 4.0 - 10.5 K/uL   RBC 3.44 (L) 3.87 - 5.11 MIL/uL   Hemoglobin 10.2 (L) 12.0 - 15.0 g/dL   HCT 31.8 (L) 36.0 - 46.0 %   MCV 92.4 78.0 - 100.0 fL   MCH 29.7 26.0 - 34.0 pg   MCHC 32.1 30.0 - 36.0 g/dL   RDW 15.2 11.5 - 15.5 %   Platelets 219 150 - 400 K/uL  Basic metabolic panel     Status: Abnormal   Collection Time: 12/01/16  2:29 AM  Result Value Ref Range   Sodium 144 135 - 145 mmol/L   Potassium 3.4 (L) 3.5 - 5.1 mmol/L   Chloride 114 (H) 101 - 111 mmol/L   CO2 22 22 - 32 mmol/L   Glucose, Bld 126 (H) 65 - 99 mg/dL   BUN 17 6 - 20 mg/dL   Creatinine, Ser 1.10 (H) 0.44 - 1.00 mg/dL   Calcium 8.2 (L) 8.9 - 10.3 mg/dL   GFR calc non Af Amer 47 (L) >60 mL/min   GFR calc Af Amer 55 (L) >60 mL/min   Anion gap 8 5 - 15  Magnesium     Status: None   Collection Time: 12/01/16  2:29 AM  Result Value Ref Range   Magnesium 2.1 1.7 - 2.4 mg/dL  Phosphorus     Status: Abnormal   Collection Time: 12/01/16  2:29 AM  Result Value Ref Range   Phosphorus 1.9 (L) 2.5 - 4.6 mg/dL  Blood gas, arterial     Status: Abnormal   Collection Time: 12/01/16  3:51 AM  Result Value Ref Range   FIO2 0.36    Delivery systems NASAL CANNULA    pH, Arterial 7.437 7.350 - 7.450   pCO2 arterial 32.6 32.0 - 48.0 mmHg   pO2, Arterial 147 (H) 83.0 - 108.0 mmHg   Bicarbonate 21.6 20.0 - 28.0 mmol/L   Acid-base deficit 1.9 0.0 - 2.0 mmol/L   O2 Saturation 98.3 %  Patient temperature 98.6    Collection site RIGHT RADIAL      Drawn by [id number]    Sample type ARTERIAL    Allens test (pass/fail) PASS PASS   Mechanical Rate ARTERIAL       Imaging Results (Last 48 hours)  Ct Angio Head W Or Wo Contrast  Result Date: 11/29/2016 CLINICAL DATA:  Right facial droop and slurred speech. Last seen normal 10 p.m. last night. Stroke. EXAM: CT ANGIOGRAPHY HEAD AND NECK CT PERFUSION BRAIN TECHNIQUE: Multidetector CT imaging of the head and neck was performed using the standard protocol during bolus administration of intravenous contrast. Multiplanar CT image reconstructions and MIPs were obtained to evaluate the vascular anatomy. Carotid stenosis measurements (when applicable) are obtained utilizing NASCET criteria, using the distal internal carotid diameter as the denominator. Multiphase CT imaging of the brain was performed following IV bolus contrast injection. Subsequent parametric perfusion maps were calculated using RAPID software. CONTRAST:  80 mL Isovue 370 IV COMPARISON:  CT head 11/29/2016 FINDINGS: CTA NECK FINDINGS Aortic arch: Mild atherosclerotic disease in the aortic arch. Proximal great vessels widely patent. Right carotid system: Mild atherosclerotic disease at the right carotid bifurcation. No significant carotid stenosis. Left carotid system: Noncalcified plaque along the medial wall of the left internal carotid artery with mild irregularity but no definite ulceration. Less than 25% diameter stenosis of the left internal carotid artery. Left external carotid artery patent. Vertebral arteries: Both vertebral arteries patent to the basilar without significant stenosis. Skeleton: Moderate cervical spine degenerative change. No acute skeletal abnormality. Other neck: 10 mm rim calcified nodule in the right lobe of the thyroid, with a benign appearance. Smaller partially calcified nodule in the right upper lobe of the thyroid also likely benign. Upper chest: Mild mosaic pattern of lung density in the right lung apex.  Question mild edema. Review of the MIP images confirms the above findings CTA HEAD FINDINGS Anterior circulation: Mild atherosclerotic disease in the cavernous carotid bilaterally which is calcified but non stenotic. Left M1 segment occluded. This is hyperdense on CT and appears acute. There is perfusion of the left MCA branches due to collaterals. Posterior circulation: Both vertebral arteries patent to the basilar. PICA patent bilaterally. Basilar patent. Superior cerebellar and posterior cerebral arteries patent bilaterally without stenosis. Venous sinuses: Patent Anatomic variants: None Delayed phase: Not performed Review of the MIP images confirms the above findings CT Brain Perfusion Findings: CBF (<30%) Volume: 5 mLmL. However, review of the head CT of earlier today, there is hypodensity left temporal lobe compatible with acute infarct which is larger than indicated on CT perfusion. Perfusion (Tmax>6.0s) volume: 52 mLmL Perfusion (T-max greater than 4 seconds) volume:  99 mL Mismatch Volume: 47 mL.ML. Review of the perfusion images reveals significant penumbra greater than the calculated amount involving the left temporal parietal lobe. 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 Hypodensity left lower temporal lobe compatible with acute infarct. There is significant penumbra in the left temporoparietal lobe. Irregular noncalcified plaque left internal carotid artery could be a source of emboli or not the high non stenotic atherosclerotic disease. No significant stenosis in the carotid or vertebral arteries in the neck. Atherosclerotic calcification in the cavernous carotid bilaterally. These results were called by telephone at the time of interpretation on 11/29/2016 at 10:50 Am to Dr. Rogue Jury , who verbally acknowledged these results. Electronically Signed   By: Franchot Gallo M.D.   On: 11/29/2016 11:11   Ct Head  Wo  Contrast  Result Date: 11/29/2016 CLINICAL DATA:  Stroke post thrombectomy EXAM: CT HEAD WITHOUT CONTRAST TECHNIQUE: Contiguous axial images were obtained from the base of the skull through the vertex without intravenous contrast. COMPARISON:  CT head 11/29/2016 FINDINGS: Brain: High-density within the left putamen and left caudate compatible with acute hemorrhage. There is also high density in the superior basal ganglia compatible with extravasation of contrast. The high density area is most likely a mixture of blood and contrast. This area measures approximately 6.1 x 3.5 x 3.7 cm, acute blood volume 39 mL. Hemorrhagic transformation of left temporal lobe infarct. Compression of the left lateral ventricle due to mass-effect. 6 mm midline shift to the right. Interval development of subdural hemorrhage along the tentorium bilaterally. Vascular: Normal arterial and venous enhancement post angiography. Skull: Negative for fracture. Sinuses/Orbits: Mucosal edema in the paranasal sinuses. Normal orbit. Other: None IMPRESSION: Large area of hemorrhage and extravasated contrast in the left basal ganglia, blood volume 39 mL. Hemorrhagic transformation of left temporal infarct. Mild amount of subdural hematoma along the tentorium bilaterally. 6 mm midline shift to the right. These results were called by telephone at the time of interpretation on 11/29/2016 at 2:20 pm to Dr. Luanne Bras , who verbally acknowledged these results. Electronically Signed   By: Franchot Gallo M.D.   On: 11/29/2016 14:21   Ct Angio Neck W Or Wo Contrast  Result Date: 11/29/2016 CLINICAL DATA:  Right facial droop and slurred speech. Last seen normal 10 p.m. last night. Stroke. EXAM: CT ANGIOGRAPHY HEAD AND NECK CT PERFUSION BRAIN TECHNIQUE: Multidetector CT imaging of the head and neck was performed using the standard protocol during bolus administration of intravenous contrast. Multiplanar CT image reconstructions and MIPs were  obtained to evaluate the vascular anatomy. Carotid stenosis measurements (when applicable) are obtained utilizing NASCET criteria, using the distal internal carotid diameter as the denominator. Multiphase CT imaging of the brain was performed following IV bolus contrast injection. Subsequent parametric perfusion maps were calculated using RAPID software. CONTRAST:  80 mL Isovue 370 IV COMPARISON:  CT head 11/29/2016 FINDINGS: CTA NECK FINDINGS Aortic arch: Mild atherosclerotic disease in the aortic arch. Proximal great vessels widely patent. Right carotid system: Mild atherosclerotic disease at the right carotid bifurcation. No significant carotid stenosis. Left carotid system: Noncalcified plaque along the medial wall of the left internal carotid artery with mild irregularity but no definite ulceration. Less than 25% diameter stenosis of the left internal carotid artery. Left external carotid artery patent. Vertebral arteries: Both vertebral arteries patent to the basilar without significant stenosis. Skeleton: Moderate cervical spine degenerative change. No acute skeletal abnormality. Other neck: 10 mm rim calcified nodule in the right lobe of the thyroid, with a benign appearance. Smaller partially calcified nodule in the right upper lobe of the thyroid also likely benign. Upper chest: Mild mosaic pattern of lung density in the right lung apex. Question mild edema. Review of the MIP images confirms the above findings CTA HEAD FINDINGS Anterior circulation: Mild atherosclerotic disease in the cavernous carotid bilaterally which is calcified but non stenotic. Left M1 segment occluded. This is hyperdense on CT and appears acute. There is perfusion of the left MCA branches due to collaterals. Posterior circulation: Both vertebral arteries patent to the basilar. PICA patent bilaterally. Basilar patent. Superior cerebellar and posterior cerebral arteries patent bilaterally without stenosis. Venous sinuses: Patent  Anatomic variants: None Delayed phase: Not performed Review of the MIP images confirms the above findings CT Brain Perfusion Findings:  CBF (<30%) Volume: 5 mLmL. However, review of the head CT of earlier today, there is hypodensity left temporal lobe compatible with acute infarct which is larger than indicated on CT perfusion. Perfusion (Tmax>6.0s) volume: 52 mLmL Perfusion (T-max greater than 4 seconds) volume:  99 mL Mismatch Volume: 47 mL.ML. Review of the perfusion images reveals significant penumbra greater than the calculated amount involving the left temporal parietal lobe. 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 Hypodensity left lower temporal lobe compatible with acute infarct. There is significant penumbra in the left temporoparietal lobe. Irregular noncalcified plaque left internal carotid artery could be a source of emboli or not the high non stenotic atherosclerotic disease. No significant stenosis in the carotid or vertebral arteries in the neck. Atherosclerotic calcification in the cavernous carotid bilaterally. These results were called by telephone at the time of interpretation on 11/29/2016 at 10:50 Am to Dr. Rogue Jury , who verbally acknowledged these results. Electronically Signed   By: Franchot Gallo M.D.   On: 11/29/2016 11:11   Mr Brain Wo Contrast  Result Date: 11/30/2016 CLINICAL DATA:  76 year old female who awoke with aphasia and right facial droop. Left M1 occlusion status post neuro endovascular revascularization. Subsequent large left hemisphere hemorrhage. EXAM: MRI HEAD WITHOUT CONTRAST TECHNIQUE: Multiplanar, multiecho pulse sequences of the brain and surrounding structures were obtained without intravenous contrast. COMPARISON:  Post endovascular Head CT 11/29/2016, and earlier. FINDINGS: Brain: Cortical and subcortical white matter restricted diffusion along the anterior left temporal tip and tracking  posteriorly along the left superior and middle temporal gyri (Series 5, image 19). Superimposed intracranial hemorrhage centered at the left basal ganglia with T2 and T1 heterogeneous blood products encompassing 55 x 30 x 31 mm (AP by transverse by CC) for an estimated blood volume of 26 mL. Associated diffusion susceptibility in the area of the hemorrhage, but there also seems to be some marginal restricted diffusion. Small volume of intraventricular hemorrhage layering in the occipital horns. Effaced left lateral ventricle. No ventriculomegaly. Edema surrounding the area of hemorrhage, and cytotoxic edema in the left temporal lobe. Rightward midline shift of 7 mm. Patent basilar cisterns. There are occasional small foci of restricted diffusion in the left PCA territory including the dorsal left thalamus and anterior left occipital lobe. No posterior fossa or contralateral right hemisphere diffusion restriction. Patchy nonspecific T2 hyperintensity in the pons. No cortical encephalomalacia. No chronic cerebral blood products. Negative pituitary and cervicomedullary junction. Vascular: Major intracranial vascular flow voids are preserved. Skull and upper cervical spine: Negative. Sinuses/Orbits: Normal orbits soft tissues. Visualized paranasal sinuses and mastoids are stable and well pneumatized. Other: Layering fluid in the pharynx.  Negative scalp soft tissues. IMPRESSION: 1. Left MCA infarct with the most confluent diffusion restriction in the left temporal lobe. Superimposed 5.5 cm intra-axial hemorrhage centered at the left basal ganglia with intra-axial blood volume estimated 26 mL. Associated left hemisphere edema. 2. Rightward midline shift of 7 mm.  Basilar cisterns remain patent. 3. Effaced left lateral ventricle and trace intraventricular hemorrhage without ventriculomegaly. 4. Occasional small foci of restricted diffusion also at the left PCA territory. Electronically Signed   By: Genevie Ann M.D.   On:  11/30/2016 13:59   Ct Cerebral Perfusion W Contrast  Result Date: 11/29/2016 CLINICAL DATA:  Right facial droop and slurred speech. Last seen normal 10 p.m. last night. Stroke. EXAM: CT ANGIOGRAPHY HEAD AND NECK CT PERFUSION BRAIN TECHNIQUE: Multidetector CT imaging of the head and neck  was performed using the standard protocol during bolus administration of intravenous contrast. Multiplanar CT image reconstructions and MIPs were obtained to evaluate the vascular anatomy. Carotid stenosis measurements (when applicable) are obtained utilizing NASCET criteria, using the distal internal carotid diameter as the denominator. Multiphase CT imaging of the brain was performed following IV bolus contrast injection. Subsequent parametric perfusion maps were calculated using RAPID software. CONTRAST:  80 mL Isovue 370 IV COMPARISON:  CT head 11/29/2016 FINDINGS: CTA NECK FINDINGS Aortic arch: Mild atherosclerotic disease in the aortic arch. Proximal great vessels widely patent. Right carotid system: Mild atherosclerotic disease at the right carotid bifurcation. No significant carotid stenosis. Left carotid system: Noncalcified plaque along the medial wall of the left internal carotid artery with mild irregularity but no definite ulceration. Less than 25% diameter stenosis of the left internal carotid artery. Left external carotid artery patent. Vertebral arteries: Both vertebral arteries patent to the basilar without significant stenosis. Skeleton: Moderate cervical spine degenerative change. No acute skeletal abnormality. Other neck: 10 mm rim calcified nodule in the right lobe of the thyroid, with a benign appearance. Smaller partially calcified nodule in the right upper lobe of the thyroid also likely benign. Upper chest: Mild mosaic pattern of lung density in the right lung apex. Question mild edema. Review of the MIP images confirms the above findings CTA HEAD FINDINGS Anterior circulation: Mild atherosclerotic  disease in the cavernous carotid bilaterally which is calcified but non stenotic. Left M1 segment occluded. This is hyperdense on CT and appears acute. There is perfusion of the left MCA branches due to collaterals. Posterior circulation: Both vertebral arteries patent to the basilar. PICA patent bilaterally. Basilar patent. Superior cerebellar and posterior cerebral arteries patent bilaterally without stenosis. Venous sinuses: Patent Anatomic variants: None Delayed phase: Not performed Review of the MIP images confirms the above findings CT Brain Perfusion Findings: CBF (<30%) Volume: 5 mLmL. However, review of the head CT of earlier today, there is hypodensity left temporal lobe compatible with acute infarct which is larger than indicated on CT perfusion. Perfusion (Tmax>6.0s) volume: 52 mLmL Perfusion (T-max greater than 4 seconds) volume:  99 mL Mismatch Volume: 47 mL.ML. Review of the perfusion images reveals significant penumbra greater than the calculated amount involving the left temporal parietal lobe. 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 Hypodensity left lower temporal lobe compatible with acute infarct. There is significant penumbra in the left temporoparietal lobe. Irregular noncalcified plaque left internal carotid artery could be a source of emboli or not the high non stenotic atherosclerotic disease. No significant stenosis in the carotid or vertebral arteries in the neck. Atherosclerotic calcification in the cavernous carotid bilaterally. These results were called by telephone at the time of interpretation on 11/29/2016 at 10:50 Am to Dr. Rogue Jury , who verbally acknowledged these results. Electronically Signed   By: Franchot Gallo M.D.   On: 11/29/2016 11:11   Dg Chest Port 1 View  Result Date: 12/01/2016 CLINICAL DATA:  Stroke, history of hypertension, extubation of the trachea and esophagus. EXAM: PORTABLE CHEST  1 VIEW COMPARISON:  Portable chest x-ray of Nov 29, 2016. FINDINGS: The lungs are adequately inflated. There is no focal infiltrate. The retrocardiac lung markings on the left remain mildly prominent. There is no significant pleural effusion and there is no pneumothorax. The heart is top-normal in size. The pulmonary vascularity is normal. There is calcification in the wall of the thoracic aorta. IMPRESSION: Good inflation of both lungs since  extubation. Minimal subsegmental atelectasis at the left lung base is suspected. No CHF. Thoracic aortic atherosclerosis. Electronically Signed   By: David  Martinique M.D.   On: 12/01/2016 07:27   Dg Chest Port 1 View  Result Date: 11/30/2016 CLINICAL DATA:  Confirm orogastric tube placement. EXAM: PORTABLE CHEST 1 VIEW COMPARISON:  Chest radiograph earlier this day at 0907 hour FINDINGS: Endotracheal tube is 2.3 cm from the carina. Enteric tube in place, tip and side-port below the diaphragm. Low lung volumes. Borderline cardiomegaly. No pulmonary edema. No large pleural effusion. No pneumothorax. IMPRESSION: Endotracheal tube approximately 2.3 cm from the carina. Enteric tube in place. Borderline cardiomegaly.  No new abnormality. Electronically Signed   By: Jeb Levering M.D.   On: 11/30/2016 00:28   Dg Abd Portable 1v  Result Date: 11/30/2016 CLINICAL DATA:  76 y/o  F; enteric tube placement. EXAM: PORTABLE ABDOMEN - 1 VIEW COMPARISON:  None. FINDINGS: Nonobstructive bowel gas pattern. Enteric tube tip projects over the stomach. Advanced lumbar levocurvature and lower lumbar fusion changes. IMPRESSION: Enteric tube tip projects over the mid stomach. Electronically Signed   By: Kristine Garbe M.D.   On: 11/30/2016 00:26   Ir Percutaneous Art Thrombectomy/infusion Intracranial Inc Diag Angio  Result Date: 11/30/2016 INDICATION: Patient presenting with aphasia and right-sided weakness with left gaze preference. CT angiogram of the head and neck  revealing an occluded left middle cerebral artery M1 segment with a sizable penumbra on CT perfusion study. CBF < 30 % 5 ml,Tmax > 6 secs of 52 ml with a mismatch vol of 21ml. EXAM: 1. EMERGENT LARGE VESSEL OCCLUSION THROMBOLYSIS (anterior CIRCULATION) 2. COMPARISON:  CT angiogram of the head and neck, and CT perfusion study of 11/29/2016. MEDICATIONS: Vancomycin 1.5 mg IV was administered within 1 hour of the procedure. ANESTHESIA/SEDATION: General anesthesia. CONTRAST:  Isovue 300 approximately 75 cc. FLUOROSCOPY TIME:  Fluoroscopy Time: 28 minutes 36 seconds (1539 mGy). COMPLICATIONS: None immediate. TECHNIQUE: Following a full explanation of the procedure along with the potential associated complications, an informed witnessed consent was obtained. The risks of intracranial hemorrhage of 10%, worsening neurological deficit, ventilator dependency, death and inability to revascularize were all reviewed in detail with the patient's . The patient was then put under general anesthesia by the Department of Anesthesiology at [facility]. The right groin was prepped and draped in the usual sterile fashion. Thereafter using modified Seldinger technique, transfemoral access into the right common femoral artery was obtained without difficulty. Over a 0.035 inch guidewire a 5 French Pinnacle sheath was inserted. Through this, and also over a 0.035 inch guidewire a 5 [place] JB 1 catheter was advanced to the aortic arch region and selectively positioned in the right common carotid artery and the left common carotid artery, and the innominate artery. FINDINGS: The right subclavian arteriogram demonstrates the right vertebral artery to opacify normally to the cranial skull base. Flow is noted into the right vertebrobasilar junction and the right posterior-inferior cerebellar artery. Flash opacification of the proximal basilar artery is also noted. The right common carotid arteriogram demonstrates the right external  carotid artery and its major branches to be widely patent. The right internal carotid artery at the bulb to the cranial skull base opacifies widely. The petrous, the cavernous and the supraclinoid segments are widely patent. The right middle cerebral artery and the right anterior cerebral artery opacify into the capillary and venous phases. Flash opacification via the anterior communicating artery of the left anterior cerebral artery A2 segment is also  noted. Also noted is contrast blush in the region of the middle terminate of the right maxillary sinus most consistent with inflammatory reaction. The left common carotid arteriogram demonstrates the left external carotid artery and its major branches to be widely patent. The left internal carotid artery at the bulb to the cranial skull base opacifies widely. There are mild FMD-like changes noted in the mid left internal carotid artery mid cervical segment. The petrous, the cavernous and the supraclinoid segments demonstrate wide patency. A left posterior communicating artery is seen opacifying the left posterior cerebral artery distribution. The left anterior cerebral artery opacifies into the capillary and venous phases with flash filling via the anterior communicating artery of the right anterior cerebral artery A2 and A1 segments. The left middle cerebral artery demonstrates complete angiographic occlusion in its mid M1 segment with delayed opacification of an anterior temporal branch with prominent filling defects proximally. The M1 segment, otherwise, remains completely occluded. The delayed arterial phase in the lateral projection demonstrates partial retrograde opacification of the distal perisylvian branches with large area of hypoperfusion involving the lentiform nucleus and the caudate head. PROCEDURE: ENDOVASCULAR COMPLETE REVASCULARIZATION OF OCCLUDED LEFT MIDDLE CEREBRAL ARTERY M1 SEGMENT The diagnostic JB 1 catheter in the left common carotid artery was  then exchanged over a 0.035 inch 300 cm Rosen exchange guidewire for an 8 French 55 cm Brite tip neurovascular sheath using biplane roadmap technique and constant fluoroscopic guidance. Good aspiration was obtained from the side port of the neurovascular sheath. This was then connected to continuous heparinized saline infusion. Over the [employer] guidewire, an 8 [place] 85 cm FlowGate balloon guide catheter which had been prepped with 50% contrast and 50% heparinized saline infusion was advanced and positioned in the distal left common carotid artery. The guidewire was removed. Good aspiration was obtained from the hub of the 8 [place] FlowGate guide catheter. A gentle contrast injection demonstrated no evidence of spasms, dissections or of intraluminal filling defects. At this time, using biplane roadmap technique and constant fluoroscopic guidance, over a 0.035 inch Roadrunner guidewire, the 8 [place] FlowGate guide catheter was then advanced to the distal cervical left ICA without difficulty. Good aspiration was obtained after removal of the wire. A gentle control arteriogram demonstrated mild spasm with no impediment of distal flow. Patient was treated with 1 aliquots of 25 mics of nitroglycerin with relief of the vasospasm. At this time, in a coaxial manner and with constant heparinized saline infusion using biplane roadmap technique and constant fluoroscopic guidance, a Trevo ProVue 021 microcatheter was advanced over a 0.014 inch Softip Synchro micro guidewire to the distal end of the FlowGate guide catheter. With the micro guidewire leading with a J-tip configuration the combination was navigated to the supraclinoid right ICA. A torque device was then used to manipulate the guidewire into the left middle cerebral artery followed by the microcatheter. The micro guidewire was then advanced without difficulty into the M2 M3 region of the inferior division of left middle cerebral artery followed by the  microcatheter. The guidewire was removed. Good aspiration was obtained from the hub of the microcatheter. A gentle contrast injection demonstrated antegrade flow of contrast. At this time, a 4 mm x 40 mm Solitaire FR retrieval device was advanced in a coaxial manner and with constant heparinized saline infusion using biplane roadmap technique and constant fluoroscopic guidance to the distal portion of the microcatheter. At this time the entire system was straightened by loosening the O ring on the delivery microcatheter. After  having ascertained the distal and the proximal positioning of the retrieval device, with slight forward gentle traction with the right hand on the delivery micro guidewire, with the left hand the microcatheter was retrieved unsheathing the entire Solitaire FR retrieval device with the tip of the microcatheter just proximal to the proximal landing zone of the retrieval device. A control arteriogram performed through the 8 [place] FlowGate guide catheter demonstrated significantly improved caliber and flow through the middle cerebral artery distribution with a narrowing of the retrieval device in the distal M1 segment. At this time, the balloon of the [facility] guide catheter was inflated in the distal left internal carotid artery for proximal flow arrest. The proximal portion of the retrieval device was captured into the microcatheter. There on after whilst aspirating with a 60 mL syringe at the hub of the [facility] guide catheter, the combination of the retrieval device and the microcatheter were gently retrieved and removed. The aspirate demonstrated prominent clot measuring approximately 2 mm x 4 mm. Aspiration was continued as the balloon was deflated in the left internal carotid artery. The FlowGate guide catheter was gently retrieved more proximally after deflation of the balloon. A control arteriogram performed through the 8 [place] FlowGate guide catheter demonstrated complete angiographic  revascularization of the left occluded left middle cerebral artery with patency of the left posterior cerebral and left anterior cerebral arteries. Focal segmental spasm was noted of the left middle cerebral artery involving the inferior division in the distal M1 region. This responded to 4 aliquots of 25 mics of nitroglycerin intra-arterially. A final control arteriogram performed demonstrated significant improved caliber of the previously noted vasospasm of the left middle cerebral artery, and also the left internal carotid artery mid cervical segment. Also noted of the final to control arteriograms a focal area of hyper density in the left centrum semiovale region. No evidence of angiographic extravasation or mass effect was seen. Patient's hemodynamic status, blood pressure and heart rate remained stable throughout the procedure. The 8 [place] FlowGate guide catheter and the 8 [place] neurovascular sheath were then retrieved into the abdominal aorta and exchanged over a J-tip guidewire for an 8 French Pinnacle sheath which in turn was successfully removed with the application of a closure device. The right groin showed no evidence of a hematoma or hemorrhage. The distal pulses in both feet remained Dopplerable unchanged from prior to the procedure. IMPRESSION: Status post endovascular complete revascularization of occluded left middle cerebral artery with 1 pass with the Solitaire FR 4 mm x 40 mm retrieval device, with achievement of a TICI 3 reperfusion. Groin puncture to initial reperfusion TICI2b 32 minutes. Groin puncture to TICI reperfusion 38 minutes PLAN: The patient was transferred to the CT scanner for postprocedural CT scan of the brain. Electronically Signed   By: Luanne Bras M.D.   On: 11/29/2016 14:42   Ir Angio Intra Extracran Sel Com Carotid Innominate Uni L Mod Sed  Result Date: 11/30/2016 INDICATION: Patient presenting with aphasia and right-sided weakness with left gaze preference.  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 CBF < 30 % 5 ml,Tmax > 6 secs of 52 ml with a mismatch vol of 51ml. EXAM: 1. EMERGENT LARGE VESSEL OCCLUSION THROMBOLYSIS (anterior CIRCULATION) 2. COMPARISON:  CT angiogram of the head and neck, and CT perfusion study of 11/29/2016. MEDICATIONS: Vancomycin 1.5 mg IV was administered within 1 hour of the procedure. ANESTHESIA/SEDATION: General  anesthesia. CONTRAST:  Isovue 300 approximately 75 cc. FLUOROSCOPY TIME:  Fluoroscopy Time: 28 minutes 36 seconds (1539 mGy). COMPLICATIONS: None immediate. TECHNIQUE: Following a full explanation of the procedure along with the potential associated complications, an informed witnessed consent was obtained. The risks of intracranial hemorrhage of 10%, worsening neurological deficit, ventilator dependency, death and inability to revascularize were all reviewed in detail with the patient's . The patient was then put under general anesthesia by the Department of Anesthesiology at [facility]. The right groin was prepped and draped in the usual sterile fashion. Thereafter using modified Seldinger technique, transfemoral access into the right common femoral artery was obtained without difficulty. Over a 0.035 inch guidewire a 5 French Pinnacle sheath was inserted. Through this, and also over a 0.035 inch guidewire a 5 [place] JB 1 catheter was advanced to the aortic arch region and selectively positioned in the right common carotid artery and the left common carotid artery, and the innominate artery. FINDINGS: The right subclavian arteriogram demonstrates the right vertebral artery to opacify normally to the cranial skull base. Flow is noted into the right vertebrobasilar junction and the right posterior-inferior cerebellar artery. Flash opacification of the proximal basilar artery is also noted. The right common carotid arteriogram  demonstrates the right external carotid artery and its major branches to be widely patent. The right internal carotid artery at the bulb to the cranial skull base opacifies widely. The petrous, the cavernous and the supraclinoid segments are widely patent. The right middle cerebral artery and the right anterior cerebral artery opacify into the capillary and venous phases. Flash opacification via the anterior communicating artery of the left anterior cerebral artery A2 segment is also noted. Also noted is contrast blush in the region of the middle terminate of the right maxillary sinus most consistent with inflammatory reaction. The left common carotid arteriogram demonstrates the left external carotid artery and its major branches to be widely patent. The left internal carotid artery at the bulb to the cranial skull base opacifies widely. There are mild FMD-like changes noted in the mid left internal carotid artery mid cervical segment. The petrous, the cavernous and the supraclinoid segments demonstrate wide patency. A left posterior communicating artery is seen opacifying the left posterior cerebral artery distribution. The left anterior cerebral artery opacifies into the capillary and venous phases with flash filling via the anterior communicating artery of the right anterior cerebral artery A2 and A1 segments. The left middle cerebral artery demonstrates complete angiographic occlusion in its mid M1 segment with delayed opacification of an anterior temporal branch with prominent filling defects proximally. The M1 segment, otherwise, remains completely occluded. The delayed arterial phase in the lateral projection demonstrates partial retrograde opacification of the distal perisylvian branches with large area of hypoperfusion involving the lentiform nucleus and the caudate head. PROCEDURE: ENDOVASCULAR COMPLETE REVASCULARIZATION OF OCCLUDED LEFT MIDDLE CEREBRAL ARTERY M1 SEGMENT The diagnostic JB 1 catheter in  the left common carotid artery was then exchanged over a 0.035 inch 300 cm Rosen exchange guidewire for an 8 [place] 55 cm Brite tip neurovascular sheath using biplane roadmap technique and constant fluoroscopic guidance. Good aspiration was obtained from the side port of the neurovascular sheath. This was then connected to continuous heparinized saline infusion. Over the [employer] guidewire, an 8 [place] 85 cm FlowGate balloon guide catheter which had been prepped with 50% contrast and 50% heparinized saline infusion was advanced and positioned in the distal left common carotid artery. The guidewire was removed. Good aspiration  was obtained from the hub of the 8 [place] FlowGate guide catheter. A gentle contrast injection demonstrated no evidence of spasms, dissections or of intraluminal filling defects. At this time, using biplane roadmap technique and constant fluoroscopic guidance, over a 0.035 inch Roadrunner guidewire, the 8 [place] FlowGate guide catheter was then advanced to the distal cervical left ICA without difficulty. Good aspiration was obtained after removal of the wire. A gentle control arteriogram demonstrated mild spasm with no impediment of distal flow. Patient was treated with 1 aliquots of 25 mics of nitroglycerin with relief of the vasospasm. At this time, in a coaxial manner and with constant heparinized saline infusion using biplane roadmap technique and constant fluoroscopic guidance, a Trevo ProVue 021 microcatheter was advanced over a 0.014 inch Softip Synchro micro guidewire to the distal end of the FlowGate guide catheter. With the micro guidewire leading with a J-tip configuration the combination was navigated to the supraclinoid right ICA. A torque device was then used to manipulate the guidewire into the left middle cerebral artery followed by the microcatheter. The micro guidewire was then advanced without difficulty into the M2 M3 region of the inferior division of left middle  cerebral artery followed by the microcatheter. The guidewire was removed. Good aspiration was obtained from the hub of the microcatheter. A gentle contrast injection demonstrated antegrade flow of contrast. At this time, a 4 mm x 40 mm Solitaire FR retrieval device was advanced in a coaxial manner and with constant heparinized saline infusion using biplane roadmap technique and constant fluoroscopic guidance to the distal portion of the microcatheter. At this time the entire system was straightened by loosening the O ring on the delivery microcatheter. After having ascertained the distal and the proximal positioning of the retrieval device, with slight forward gentle traction with the right hand on the delivery micro guidewire, with the left hand the microcatheter was retrieved unsheathing the entire Solitaire FR retrieval device with the tip of the microcatheter just proximal to the proximal landing zone of the retrieval device. A control arteriogram performed through the 8 [place] FlowGate guide catheter demonstrated significantly improved caliber and flow through the middle cerebral artery distribution with a narrowing of the retrieval device in the distal M1 segment. At this time, the balloon of the [facility] guide catheter was inflated in the distal left internal carotid artery for proximal flow arrest. The proximal portion of the retrieval device was captured into the microcatheter. There on after whilst aspirating with a 60 mL syringe at the hub of the [facility] guide catheter, the combination of the retrieval device and the microcatheter were gently retrieved and removed. The aspirate demonstrated prominent clot measuring approximately 2 mm x 4 mm. Aspiration was continued as the balloon was deflated in the left internal carotid artery. The FlowGate guide catheter was gently retrieved more proximally after deflation of the balloon. A control arteriogram performed through the 8 [place] FlowGate guide catheter  demonstrated complete angiographic revascularization of the left occluded left middle cerebral artery with patency of the left posterior cerebral and left anterior cerebral arteries. Focal segmental spasm was noted of the left middle cerebral artery involving the inferior division in the distal M1 region. This responded to 4 aliquots of 25 mics of nitroglycerin intra-arterially. A final control arteriogram performed demonstrated significant improved caliber of the previously noted vasospasm of the left middle cerebral artery, and also the left internal carotid artery mid cervical segment. Also noted of the final to control arteriograms a focal area of hyper  density in the left centrum semiovale region. No evidence of angiographic extravasation or mass effect was seen. Patient's hemodynamic status, blood pressure and heart rate remained stable throughout the procedure. The 8 [place] FlowGate guide catheter and the 8 [place] neurovascular sheath were then retrieved into the abdominal aorta and exchanged over a J-tip guidewire for an 8 [place] Pinnacle sheath which in turn was successfully removed with the application of a closure device. The right groin showed no evidence of a hematoma or hemorrhage. The distal pulses in both feet remained Dopplerable unchanged from prior to the procedure. IMPRESSION: Status post endovascular complete revascularization of occluded left middle cerebral artery with 1 pass with the Solitaire FR 4 mm x 40 mm retrieval device, with achievement of a TICI 3 reperfusion. Groin puncture to initial reperfusion TICI2b 32 minutes. Groin puncture to TICI reperfusion 38 minutes PLAN: The patient was transferred to the CT scanner for postprocedural CT scan of the brain. Electronically Signed   By: Luanne Bras M.D.   On: 11/29/2016 14:42   Ir Angio Vertebral Sel Subclavian Innominate Uni R Mod Sed  Result Date: 11/30/2016 INDICATION: Patient presenting with aphasia and right-sided weakness  with left gaze preference. 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 CBF < 30 % 5 ml,Tmax > 6 secs of 52 ml with a mismatch vol of 47ml. EXAM: 1. EMERGENT LARGE VESSEL OCCLUSION THROMBOLYSIS (anterior CIRCULATION) 2. COMPARISON:  CT angiogram of the head and neck, and CT perfusion study of 11/29/2016. MEDICATIONS: Vancomycin 1.5 mg IV was administered within 1 hour of the procedure. ANESTHESIA/SEDATION: General anesthesia. CONTRAST:  Isovue 300 approximately 75 cc. FLUOROSCOPY TIME:  Fluoroscopy Time: 28 minutes 36 seconds (1539 mGy). COMPLICATIONS: None immediate. TECHNIQUE: Following a full explanation of the procedure along with the potential associated complications, an informed witnessed consent was obtained. The risks of intracranial hemorrhage of 10%, worsening neurological deficit, ventilator dependency, death and inability to revascularize were all reviewed in detail with the patient's . The patient was then put under general anesthesia by the Department of Anesthesiology at [facility]. The right groin was prepped and draped in the usual sterile fashion. Thereafter using modified Seldinger technique, transfemoral access into the right common femoral artery was obtained without difficulty. Over a 0.035 inch guidewire a 5 French Pinnacle sheath was inserted. Through this, and also over a 0.035 inch guidewire a 5 [place] JB 1 catheter was advanced to the aortic arch region and selectively positioned in the right common carotid artery and the left common carotid artery, and the innominate artery. FINDINGS: The right subclavian arteriogram demonstrates the right vertebral artery to opacify normally to the cranial skull base. Flow is noted into the right vertebrobasilar junction and the right posterior-inferior cerebellar artery. Flash opacification of the proximal basilar artery is also noted. The right  common carotid arteriogram demonstrates the right external carotid artery and its major branches to be widely patent. The right internal carotid artery at the bulb to the cranial skull base opacifies widely. The petrous, the cavernous and the supraclinoid segments are widely patent. The right middle cerebral artery and the right anterior cerebral artery opacify into the capillary and venous phases. Flash opacification via the anterior communicating artery of the left anterior cerebral artery A2 segment is also noted. Also noted is contrast blush in the region of the middle terminate of the right maxillary sinus most consistent with inflammatory  reaction. The left common carotid arteriogram demonstrates the left external carotid artery and its major branches to be widely patent. The left internal carotid artery at the bulb to the cranial skull base opacifies widely. There are mild FMD-like changes noted in the mid left internal carotid artery mid cervical segment. The petrous, the cavernous and the supraclinoid segments demonstrate wide patency. A left posterior communicating artery is seen opacifying the left posterior cerebral artery distribution. The left anterior cerebral artery opacifies into the capillary and venous phases with flash filling via the anterior communicating artery of the right anterior cerebral artery A2 and A1 segments. The left middle cerebral artery demonstrates complete angiographic occlusion in its mid M1 segment with delayed opacification of an anterior temporal branch with prominent filling defects proximally. The M1 segment, otherwise, remains completely occluded. The delayed arterial phase in the lateral projection demonstrates partial retrograde opacification of the distal perisylvian branches with large area of hypoperfusion involving the lentiform nucleus and the caudate head. PROCEDURE: ENDOVASCULAR COMPLETE REVASCULARIZATION OF OCCLUDED LEFT MIDDLE CEREBRAL ARTERY M1 SEGMENT The  diagnostic JB 1 catheter in the left common carotid artery was then exchanged over a 0.035 inch 300 cm Rosen exchange guidewire for an 8 French 55 cm Brite tip neurovascular sheath using biplane roadmap technique and constant fluoroscopic guidance. Good aspiration was obtained from the side port of the neurovascular sheath. This was then connected to continuous heparinized saline infusion. Over the [employer] guidewire, an 8 [place] 85 cm FlowGate balloon guide catheter which had been prepped with 50% contrast and 50% heparinized saline infusion was advanced and positioned in the distal left common carotid artery. The guidewire was removed. Good aspiration was obtained from the hub of the 8 [place] FlowGate guide catheter. A gentle contrast injection demonstrated no evidence of spasms, dissections or of intraluminal filling defects. At this time, using biplane roadmap technique and constant fluoroscopic guidance, over a 0.035 inch Roadrunner guidewire, the 8 [place] FlowGate guide catheter was then advanced to the distal cervical left ICA without difficulty. Good aspiration was obtained after removal of the wire. A gentle control arteriogram demonstrated mild spasm with no impediment of distal flow. Patient was treated with 1 aliquots of 25 mics of nitroglycerin with relief of the vasospasm. At this time, in a coaxial manner and with constant heparinized saline infusion using biplane roadmap technique and constant fluoroscopic guidance, a Trevo ProVue 021 microcatheter was advanced over a 0.014 inch Softip Synchro micro guidewire to the distal end of the FlowGate guide catheter. With the micro guidewire leading with a J-tip configuration the combination was navigated to the supraclinoid right ICA. A torque device was then used to manipulate the guidewire into the left middle cerebral artery followed by the microcatheter. The micro guidewire was then advanced without difficulty into the M2 M3 region of the inferior  division of left middle cerebral artery followed by the microcatheter. The guidewire was removed. Good aspiration was obtained from the hub of the microcatheter. A gentle contrast injection demonstrated antegrade flow of contrast. At this time, a 4 mm x 40 mm Solitaire FR retrieval device was advanced in a coaxial manner and with constant heparinized saline infusion using biplane roadmap technique and constant fluoroscopic guidance to the distal portion of the microcatheter. At this time the entire system was straightened by loosening the O ring on the delivery microcatheter. After having ascertained the distal and the proximal positioning of the retrieval device, with slight forward gentle traction with the right hand on  the delivery micro guidewire, with the left hand the microcatheter was retrieved unsheathing the entire Solitaire FR retrieval device with the tip of the microcatheter just proximal to the proximal landing zone of the retrieval device. A control arteriogram performed through the 8 [place] FlowGate guide catheter demonstrated significantly improved caliber and flow through the middle cerebral artery distribution with a narrowing of the retrieval device in the distal M1 segment. At this time, the balloon of the [facility] guide catheter was inflated in the distal left internal carotid artery for proximal flow arrest. The proximal portion of the retrieval device was captured into the microcatheter. There on after whilst aspirating with a 60 mL syringe at the hub of the [facility] guide catheter, the combination of the retrieval device and the microcatheter were gently retrieved and removed. The aspirate demonstrated prominent clot measuring approximately 2 mm x 4 mm. Aspiration was continued as the balloon was deflated in the left internal carotid artery. The FlowGate guide catheter was gently retrieved more proximally after deflation of the balloon. A control arteriogram performed through the 8 [place]  FlowGate guide catheter demonstrated complete angiographic revascularization of the left occluded left middle cerebral artery with patency of the left posterior cerebral and left anterior cerebral arteries. Focal segmental spasm was noted of the left middle cerebral artery involving the inferior division in the distal M1 region. This responded to 4 aliquots of 25 mics of nitroglycerin intra-arterially. A final control arteriogram performed demonstrated significant improved caliber of the previously noted vasospasm of the left middle cerebral artery, and also the left internal carotid artery mid cervical segment. Also noted of the final to control arteriograms a focal area of hyper density in the left centrum semiovale region. No evidence of angiographic extravasation or mass effect was seen. Patient's hemodynamic status, blood pressure and heart rate remained stable throughout the procedure. The 8 [place] FlowGate guide catheter and the 8 [place] neurovascular sheath were then retrieved into the abdominal aorta and exchanged over a J-tip guidewire for an 8 French Pinnacle sheath which in turn was successfully removed with the application of a closure device. The right groin showed no evidence of a hematoma or hemorrhage. The distal pulses in both feet remained Dopplerable unchanged from prior to the procedure. IMPRESSION: Status post endovascular complete revascularization of occluded left middle cerebral artery with 1 pass with the Solitaire FR 4 mm x 40 mm retrieval device, with achievement of a TICI 3 reperfusion. Groin puncture to initial reperfusion TICI2b 32 minutes. Groin puncture to TICI reperfusion 38 minutes PLAN: The patient was transferred to the CT scanner for postprocedural CT scan of the brain. Electronically Signed   By: Luanne Bras M.D.   On: 11/29/2016 14:42     Assessment/Plan: Diagnosis: Right hemiparesis. Aphasia and dysphagia secondary to left MCA infarct 1. Does the need for close,  24 hr/day medical supervision in concert with the patient's rehab needs make it unreasonable for this patient to be served in a less intensive setting? Yes 2. Co-Morbidities requiring supervision/potential complications: Atrial fibrillation, status post respiratory failure 3. Due to bladder management, bowel management, safety, skin/wound care, disease management, medication administration, pain management and patient education, does the patient require 24 hr/day rehab nursing? Yes 4. Does the patient require coordinated care of a physician, rehab nurse, PT (1-2 hrs/day, 5 days/week), OT (1-2 hrs/day, 5 days/week) and SLP (.5-1 hrs/day, 5 days/week) to address physical and functional deficits in the context of the above medical diagnosis(es)? Yes Addressing deficits in the  following areas: balance, endurance, locomotion, strength, transferring, bowel/bladder control, bathing, dressing, feeding, grooming, toileting, cognition, speech, language, swallowing and psychosocial support 5. Can the patient actively participate in an intensive therapy program of at least 3 hrs of therapy per day at least 5 days per week? Yes 6. The potential for patient to make measurable gains while on inpatient rehab is good 7. Anticipated functional outcomes upon discharge from inpatient rehab are supervision and min assist  with PT, supervision and min assist with OT, supervision and min assist with SLP. 8. Estimated rehab length of stay to reach the above functional goals is: 19-22d 9. Anticipated D/C setting: Home 10. Anticipated post D/C treatments: [place] therapy 11. Overall Rehab/Functional Prognosis: excellent  RECOMMENDATIONS: This patient's condition is appropriate for continued rehabilitative care in the following setting: CIR Patient has agreed to participate in recommended program. N/A Note that insurance prior authorization may be required for reimbursement for recommended care.  Comment:   Charlett Blake M.D. [facility] FAAPM&R (Sports Med, Neuromuscular Med) Diplomate Am Board of Electrodiagnostic Med  Flora Lipps 12/01/2016    Revision History                             Routing History

## 2016-12-03 NOTE — Progress Notes (Signed)
EP has been asked to see patient for consideration of ILR for evaluation of AF in the setting of cryptogenic stroke.  With frequent PAC's on telemetry, we would recommend 30 day monitor at discharge. If no AF detected on 30 day monitor, can consider ILR implant at that time.  Will discuss with neurology and Dr Acie Fredrickson.  Chanetta Marshall, NP 12/03/2016 7:31 AM

## 2016-12-03 NOTE — H&P (Signed)
Physical Medicine and Rehabilitation Admission H&P        Chief Complaint  Patient presents with  . Cerebrovascular Accident with right sided weakness, slurred speech, aphasia and dysphagia.     HPI:  Valerie Mcclure is a 76 y.o. female with history of chronic abdominal and LBP, CKD, hyperlipidemia who fell out of bed on 5/13 am and was found to have left facial weakness with slurred speech and right sided weakness. CT head done revealing hyperdense L-MCA sign with hypodensity in left temporal area. CTA showed left M1 occlusion due to acute thrombus or embolus with significant penumbra left temporoparietal lobe and irregular non-calcified plaque L-ICA. She underwent cerebral angio with complete revascularization of L-MCA with reperfusion. Follow up CCT showed hemorrhagic transformation of left temporal infarct, large area of hemorrhage and extravasated contrast in left basal ganglia,  minimal SDH along tentorium and 6 mm midline shift to right. MRI brain  L-MCA infarct with diffusion restriction left temporal lobe, superimposed 5.5 cm intra-axial hemorrhage centered at left basal ganglia with associated left hemisphere edema, effaced left lateral ventricle with trace IVH and occasional small foci of restriction in left thalamus and anterior left occipital lobe.     She was extubated 5/14 pm and Dr. Acie Fredrickson consulted for input on new onset A fib on telemetry. EKG without evidence of A fib. 2 D echo showed EF 55-60% with mild mitral regurg. He felt that patient with multifocal PACs and might be having bouts of A fib due to stress v/s sinus arrhthymias. Question event monitor v/s loop to evaluate for arrhthymias--to have 30 day monitor at discharge and loop recorder if monitor unrevealing.  BLE ultrasound without DVT--right groin limited due to bandage.  FEES done showing silent penetration with liquids--D1, honey by teaspoon recommended due to dysphagia.  Dr. Erlinda Hong felt that stroke embolic due to unknown  source and to start ASA on 5/24 for secondary stroke prevention.  Speech evaluation revealed moderate dysarthria with expressive > receptive aphasia with phonemic paraphasias and anomia. Patient with resultant right sided weakness with right lean, right inattention, motor apraxia and DOE. CIR recommended for follow up therapy.      Review of Systems  Unable to perform ROS: Language  HENT: Negative for hearing loss.   Eyes: Negative for blurred vision and double vision.  Respiratory: Negative for shortness of breath.   Cardiovascular: Negative for chest pain.  Gastrointestinal: Negative for heartburn and nausea.  Musculoskeletal: Negative for myalgias.  Skin: Negative for itching.  Neurological: Positive for sensory change and focal weakness.            Past Medical History:  Diagnosis Date  . Blood transfusion without reported diagnosis    . Chronic low back pain    . Hyperlipidemia    . Osteopenia    . Unspecified essential hypertension    . Unspecified hypothyroidism             Past Surgical History:  Procedure Laterality Date  . ABDOMINAL HYSTERECTOMY   1970  . CHOLECYSTECTOMY   07/2009    Dr. Rise Patience  . COLONOSCOPY   2003  . FLEXIBLE SIGMOIDOSCOPY   2010  . HAND SURGERY      . IR ANGIO INTRA EXTRACRAN SEL COM CAROTID INNOMINATE UNI L MOD SED   11/29/2016  . IR ANGIO VERTEBRAL SEL SUBCLAVIAN INNOMINATE UNI R MOD SED   11/29/2016  . IR PERCUTANEOUS ART THROMBECTOMY/INFUSION INTRACRANIAL INC DIAG ANGIO   11/29/2016  . LUMBAR LAMINECTOMY  11/2008    Done by Dr. Patrice Paradise  . RADIOLOGY WITH ANESTHESIA N/A 11/29/2016    Procedure: RADIOLOGY WITH ANESTHESIA;  Surgeon: Radiologist, Medication, MD;  Location: [facility];  Service: Radiology;  Laterality: N/A;  . THYROIDECTOMY               Family History  Problem Relation Age of Onset  . Heart disease Father 61        MI age 51s  . Lung cancer Brother 75  . Colon cancer Neg Hx    . Esophageal cancer Neg Hx    . Rectal cancer Neg  Hx    . Stomach cancer Neg Hx        Social History: Married. 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 Her husband had heart attack last week. Daughter lives with them and can assists as needed? She reports that she has never smoked. She has never used smokeless tobacco. She reports that she does not drink alcohol or use drugs.           Allergies  Allergen Reactions  . Shrimp [Shellfish Allergy]        Break outs and swelling   . Tandem Plus [Fefum-Fepo-Fa-B Cmp-C-Zn-Mn-Cu] Nausea And Vomiting  . Ivp Dye [Iodinated Diagnostic Agents] Rash      itching  . Penicillins Rash      itching            Medications Prior to Admission  Medication Sig Dispense Refill  . estradiol (ESTRACE) 0.5 MG tablet TAKE 1 TABLET(0.5 MG) BY MOUTH DAILY 90 tablet 2  . FLUoxetine (PROZAC) 20 MG capsule Take 1 capsule (20 mg total) by mouth daily. (Patient taking differently: Take 40 mg by mouth daily. ) 30 capsule 11  . furosemide (LASIX) 20 MG tablet TAKE 1 TABLET BY MOUTH DAILY AS NEEDED 90 tablet 1  . levothyroxine (SYNTHROID, LEVOTHROID) 75 MCG tablet Take 75 mcg by mouth daily before breakfast.      . metoprolol tartrate (LOPRESSOR) 25 MG tablet TAKE 1 TABLET(25 MG) BY MOUTH TWICE DAILY 180 tablet 2  . morphine (MS CONTIN) 15 MG 12 hr tablet Take 15 mg by mouth every 12 (twelve) hours.       Marland Kitchen NIFEdipine (PROCARDIA-XL/ADALAT-CC/NIFEDICAL-XL) 30 MG 24 hr tablet Take 30 mg by mouth daily.      . nortriptyline (PAMELOR) 10 MG capsule TAKE 2 CAPSULES BY MOUTH AT BEDTIME 180 capsule 1  . simvastatin (ZOCOR) 10 MG tablet TAKE 1 TABLET(10 MG) BY MOUTH DAILY 90 tablet 3  . valsartan (DIOVAN) 160 MG tablet Take 1 tablet (160 mg total) by mouth daily. 90 tablet 3  . vitamin B-12 (CYANOCOBALAMIN) 100 MCG tablet Take 100 mcg by mouth daily.      . Calcium Carbonate-Vit D-Min (CALCIUM 1200 PO) Take by mouth daily.        . Cholecalciferol (VITAMIN D3) 1000 UNITS CAPS Take by mouth daily.        Marland Kitchen levothyroxine  (SYNTHROID, LEVOTHROID) 88 MCG tablet Take 1 tablet (88 mcg total) by mouth daily. 90 tablet 1      Home: Home Living Family/patient expects to be discharged to:: Private residence Living Arrangements: Spouse/significant other, Children Available Help at Discharge: Family Type of Home: House Home Layout: One level Bathroom Shower/Tub: Tub/shower unit Home Equipment:  (TBA) Additional Comments: pt able to answer via yes/no questions inconsistently.  No family present during eval   Lives With: Spouse, Daughter   Functional History: Prior Function Level  of Independence: Independent Comments: Pt indicates she drives and was fully independent.  Family not available to confirm    Functional Status:  Mobility: Bed Mobility Overal bed mobility: Needs Assistance Bed Mobility: Supine to Sit Supine to sit: Mod assist, HOB elevated General bed mobility comments: Able to bring RLE to EOB with increased time and cues; use of rail and assist to elevate trunk to get to EOB. Listing right initially but able to correct with Min A. Transfers Overall transfer level: Needs assistance Equipment used: 2 person hand held assist Transfers: Sit to/from Stand Sit to Stand: Mod assist, +2 safety/equipment General transfer comment: Stood from EOB x3, from chair x4 with right hand on right knee and left hand providing WB and input into RUE upon standing. Using chair for support in front for standing tasks. Some instability right knee and it goes out into knee extension requiring Mod A to bring RLE back under patient with heavy lean right. Transferred to chair post ambulation. Ambulation/Gait Ambulation/Gait assistance: Max assist, +2 physical assistance Ambulation Distance (Feet): 6 Feet Assistive device: 2 person hand held assist (hands on chair) Gait Pattern/deviations: Step-to pattern, Decreased step length - right, Decreased stance time - right, Narrow base of support, Step-through pattern, Trunk  flexed General Gait Details: Pt with RLE adduction/ER and difficulty sequencing progression without manual cues to control RLE. Assist for trunk control due ot right lateral lean, maintain right hand supported on chair and assist for right knee due to instability with weight bearing. Assist for weight shift as well. 3/4 DOE requiring seated rest break. Gait velocity: decreased Gait velocity interpretation: Below normal speed for age/gender   ADL: ADL Overall ADL's : Needs assistance/impaired Eating/Feeding: NPO Grooming: Wash/dry hands, Wash/dry face, Oral care, Brushing hair, Total assistance, Sitting Grooming Details (indicate cue type and reason): when cued, pt will move comb to head, but was too fatigued to complete activity  Upper Body Bathing: Moderate assistance, Sitting Lower Body Bathing: Maximal assistance, Sit to/from stand Upper Body Dressing : Maximal assistance, Sitting Lower Body Dressing: Total assistance, Sit to/from stand Toilet Transfer: Moderate assistance, +2 for physical assistance, Stand-pivot, BSC Toileting- Clothing Manipulation and Hygiene: Total assistance, Sit to/from stand Functional mobility during ADLs: Moderate assistance, +2 for physical assistance General ADL Comments: Pt limited by Rt hemiparesis, impaired balance, Rt inattention, and apraxia    Cognition: Cognition Overall Cognitive Status: Difficult to assess Arousal/Alertness: Awake/alert Orientation Level: Oriented to person, Oriented to place, Disoriented to place, Disoriented to time Attention: Sustained Sustained Attention: Appears intact Safety/Judgment: Impaired Cognition Arousal/Alertness: Awake/alert Behavior During Therapy: WFL for tasks assessed/performed Overall Cognitive Status: Difficult to assess General Comments: Pt follows one step commands consistently with gestural cues for more complex commands or multi step commands. Expressive aphasia noted. Difficult to assess due to:  Impaired communication     Blood pressure (!) 144/54, pulse 79, temperature 98.6 F (37 C), temperature source Oral, resp. rate 18, height 5' 3" (1.6 m), weight 79.5 kg (175 lb 4.3 oz), SpO2 97 %. Physical Exam  Nursing note and vitals reviewed. Constitutional: She is oriented to person, place, and time. She appears well-developed and well-nourished.  Alert and interactive today.   HENT:  Head: Normocephalic and atraumatic.  Mouth/Throat: Oropharynx is clear and moist.  Eyes: Conjunctivae are normal. Pupils are equal, round, and reactive to light.  Neck: Normal range of motion. Neck supple.  Cardiovascular: Normal rate and regular rhythm.   Respiratory: Effort normal and breath sounds normal. No stridor. No  respiratory distress. She has no wheezes.  GI: Soft. Bowel sounds are normal. She exhibits no distension. There is no tenderness.  Musculoskeletal: She exhibits tenderness. She exhibits no edema.  Neurological: She is alert and oriented to person, place, and time.  Right facial weakness with dysarthria. Able to answer basic biographic Y/N question with improved accuracy. Verbal output with paraphrasia's and occasional garbled speech.  Expressive > receptive aphasia. Right hemiparesis with sensory deficits.   Skin: Skin is warm and dry.  Psychiatric: She has a normal mood and affect. Her behavior is normal.    motor strength is 2 minus at the finger flexors and extensors as well as wrist flexors and extensors on the right side. Trace at the biceps, 0 at the deltoid, 0 at the triceps, trace at the hip flexors 3 minus, knee extensor, trace ankle dorsiflexor, plantar flexor. Sensation is absent to pinch in the right upper and right lower limb. Left side has normal strength and sensation   Lab Results Last 48 Hours        Results for orders placed or performed during the hospital encounter of 11/29/16 (from the past 48 hour(s))  CBC     Status: Abnormal    Collection Time: 12/02/16  7:55  AM  Result Value Ref Range    WBC 12.4 (H) 4.0 - 10.5 K/uL    RBC 3.33 (L) 3.87 - 5.11 MIL/uL    Hemoglobin 9.7 (L) 12.0 - 15.0 g/dL    HCT 31.0 (L) 36.0 - 46.0 %    MCV 93.1 78.0 - 100.0 fL    MCH 29.1 26.0 - 34.0 pg    MCHC 31.3 30.0 - 36.0 g/dL    RDW 15.7 (H) 11.5 - 15.5 %    Platelets 186 150 - 400 K/uL  Basic metabolic panel     Status: Abnormal    Collection Time: 12/02/16  7:55 AM  Result Value Ref Range    Sodium 145 135 - 145 mmol/L    Potassium 2.9 (L) 3.5 - 5.1 mmol/L    Chloride 118 (H) 101 - 111 mmol/L    CO2 19 (L) 22 - 32 mmol/L    Glucose, Bld 158 (H) 65 - 99 mg/dL    BUN 14 6 - 20 mg/dL    Creatinine, Ser 1.02 (H) 0.44 - 1.00 mg/dL    Calcium 8.1 (L) 8.9 - 10.3 mg/dL    GFR calc non Af Amer 52 (L) >60 mL/min    GFR calc Af Amer 60 (L) >60 mL/min      Comment: (NOTE) The eGFR has been calculated using the CKD EPI equation. This calculation has not been validated in all clinical situations. eGFR's persistently <60 mL/min signify possible Chronic Kidney Disease.      Anion gap 8 5 - 15  CBC     Status: Abnormal    Collection Time: 12/03/16  5:18 AM  Result Value Ref Range    WBC 9.7 4.0 - 10.5 K/uL    RBC 3.18 (L) 3.87 - 5.11 MIL/uL    Hemoglobin 9.1 (L) 12.0 - 15.0 g/dL    HCT 29.4 (L) 36.0 - 46.0 %    MCV 92.5 78.0 - 100.0 fL    MCH 28.6 26.0 - 34.0 pg    MCHC 31.0 30.0 - 36.0 g/dL    RDW 15.5 11.5 - 15.5 %    Platelets 199 150 - 400 K/uL  Basic metabolic panel     Status: Abnormal  Collection Time: 12/03/16  5:18 AM  Result Value Ref Range    Sodium 145 135 - 145 mmol/L    Potassium 3.8 3.5 - 5.1 mmol/L      Comment: DELTA CHECK NOTED    Chloride 117 (H) 101 - 111 mmol/L    CO2 21 (L) 22 - 32 mmol/L    Glucose, Bld 125 (H) 65 - 99 mg/dL    BUN 17 6 - 20 mg/dL    Creatinine, Ser 1.07 (H) 0.44 - 1.00 mg/dL    Calcium 8.1 (L) 8.9 - 10.3 mg/dL    GFR calc non Af Amer 49 (L) >60 mL/min    GFR calc Af Amer 57 (L) >60 mL/min      Comment:  (NOTE) The eGFR has been calculated using the CKD EPI equation. This calculation has not been validated in all clinical situations. eGFR's persistently <60 mL/min signify possible Chronic Kidney Disease.      Anion gap 7 5 - 15      Imaging Results (Last 48 hours)  No results found.           Medical Problem List and Plan: 1.  Right hemiparesis and aphasia  secondary to left MCA distribution infarct 2.  DVT Prophylaxis/Anticoagulation: Mechanical: Sequential compression devices, below knee Bilateral lower extremities 3. Chronic back pain/Pain Management: On pamelor for neuropathy and  MS contin bid.  4. Mood: LCSW to follow for evaluation and support.  5. Neuropsych: This patient is not capable of making decisions on her  own behalf. 6. Skin/Wound Care: routine pressure relief measures.  7. Fluids/Electrolytes/Nutrition: check lytes in am. May need IVF at nights if unable to maintain hydration.  8.HTN: Monitor BP bid. Continue avapro daily 9.  PAC's: Monitor heart rate bid. Currently in NSR.  10. Diastolic Heart failure: Asymptomatic.  11.H/o anemia with multiple transfusion: 12. Depression: has been managed on Prozac.  13. CKD: Baseline SCr- 1.4 range. Continue to monitor. Check lytes serially to monitor stability.  14. Prediabetes: Hgb A1C- 5.8. Educate on appropriate diet as cognition improves.   15. Dysphagia: On dysphagia 1, honey liquids by tsp--question ability to      Post Admission Physician Evaluation: 1. Functional deficits secondary  to left MCA infarct. 2. Patient is admitted to receive collaborative, interdisciplinary care between the physiatrist, rehab nursing staff, and therapy team. 3. Patient's level of medical complexity and substantial therapy needs in context of that medical necessity cannot be provided at a lesser intensity of care such as a SNF. 4. Patient has experienced substantial functional loss from his/her baseline which was documented above under  the "Functional History" and "Functional Status" headings.  Judging by the patient's diagnosis, physical exam, and functional history, the patient has potential for functional progress which will result in measurable gains while on inpatient rehab.  These gains will be of substantial and practical use upon discharge  in facilitating mobility and self-care at the household level. 5. Physiatrist will provide 24 hour management of medical needs as well as oversight of the therapy plan/treatment and provide guidance as appropriate regarding the interaction of the two. 6. The Preadmission Screening has been reviewed and patient status is unchanged unless otherwise stated above. 7. 24 hour rehab nursing will assist with bladder management, bowel management, safety, skin/wound care, disease management, medication administration, pain management and patient education  and help integrate therapy concepts, techniques,education, etc. 8. PT will assess and treat for/with: pre gait, gait training, endurance , safety, equipment, neuromuscular re  education.   Goals are: MinA. 9. OT will assess and treat for/with: ADLs, Cognitive perceptual skills, Neuromuscular re education, safety, endurance, equipment.   Goals are: Min A. Therapy may proceed with showering this patient. 10. SLP will assess and treat for/with: Receptive language, expressive language, swallowing.  Goals are: Express basic needs on a consistent basis, safe and adequate by mouth nutrition. 11. Case Management and Social Worker will assess and treat for psychological issues and discharge planning. 12. Team conference will be held weekly to assess progress toward goals and to determine barriers to discharge. 13. Patient will receive at least 3 hours of therapy per day at least 5 days per week. 14. ELOS: 18-21d       15. Prognosis:  excellent         Charlett Blake M.D. [facility] FAAPM&R (Sports Med, Neuromuscular Med) Diplomate  Am Board of Electrodiagnostic Med  Flora Lipps 12/03/2016

## 2016-12-03 NOTE — Progress Notes (Signed)
Rehab admissions - I met with patient, her daughter, step daughter and grandson at the bedside.  I gave family another set of rehab booklets.  Patient and family would like inpatient rehab admission.  They do not want SNF placement.  I hope to have a bed on rehab tomorrow or Saturday if I get authorization for insurance case manager.  I will update all once I hear back from insurance carrier.  Call me for questions.  #[phone]

## 2016-12-03 NOTE — NC FL2 (Signed)
[place] LEVEL OF CARE SCREENING TOOL     IDENTIFICATION  Patient Name: Valerie Mcclure Birthdate: [birth year] Sex: female Admission Date (Current Location): 11/29/2016  [facility] and [place] Number:  Herbalist and Address:  [facility]. [facility], [place] [address], [place], [place] [address]      Provider Number: [id number]  Attending Physician Name and Address:  Rosalin Hawking, MD  Relative Name and Phone Number:       Current Level of Care: Hospital Recommended Level of Care: [place] Prior Approval Number:    Date Approved/Denied:   PASRR Number: [id number]  Discharge Plan: SNF    Current Diagnoses: Patient Active Problem List   Diagnosis Date Noted  . Nontraumatic subcortical hemorrhage of left cerebral hemisphere ([place])   . Acute embolic stroke ([place]) 59/56/3875  . Mild aortic regurgitation 08/20/2016  . Bilateral edema of lower extremity 04/07/2016  . Pancreatic duct dilated 01/09/2016  . CKD (chronic kidney disease) 12/25/2015  . Mild tricuspid regurgitation 12/25/2015  . Mild mitral regurgitation 12/25/2015  . Prediabetes 06/10/2015  . Edema 06/04/2015  . Abdominal pain, chronic, right upper quadrant 06/04/2015  . Postmenopausal disorder 04/03/2014  . Chronic low back pain   . PAIN IN THORACIC SPINE 04/18/2010  . Coronary atherosclerosis 01/24/2009  . DEGENERATIVE JOINT DISEASE 01/23/2009  . ARTHRITIS, LEFT KNEE 12/13/2008  . HERNIATED LUMBAR DISC 12/13/2008  . Hypothyroidism 11/21/2007  . Hyperlipidemia 11/09/2007  . ANEMIA 11/09/2007  . Essential hypertension 11/09/2007  . Osteopenia 11/09/2007    Orientation RESPIRATION BLADDER Height & Weight     Self, Time, Situation, Place  Normal Incontinent Weight: 175 lb 4.3 oz (79.5 kg) Height:  5\' 3"  (160 cm)  BEHAVIORAL SYMPTOMS/MOOD NEUROLOGICAL BOWEL NUTRITION STATUS      Continent Diet (puree)  AMBULATORY STATUS COMMUNICATION OF NEEDS Skin    Extensive Assist Verbally Normal                       Personal Care Assistance Level of Assistance  Bathing, Dressing Bathing Assistance: Maximum assistance   Dressing Assistance: Maximum assistance     Functional Limitations Info  Speech     Speech Info: Impaired (current aphasia)    SPECIAL CARE FACTORS FREQUENCY  PT (By licensed PT), OT (By licensed OT), Speech therapy     PT Frequency: 5x/wk OT Frequency: 5x/wk     Speech Therapy Frequency: 2x/wk      Contractures      Additional Factors Info  Code Status, Allergies, Psychotropic Code Status Info: full Allergies Info: Shrimp Shellfish Allergy, Tandem Plus Fefum-fepo-fa-b Cmp-c-zn-mn-cu, Ivp Dye Iodinated Diagnostic Agents, Penicillins Psychotropic Info: Prozac 40 mg         Current Medications (12/03/2016):  This is the current hospital active medication list Current Facility-Administered Medications  Medication Dose Route Frequency Provider Last Rate Last Dose  .  stroke: mapping our early stages of recovery book   Does not apply Once Rogue Jury, MD      . acetaminophen (TYLENOL) tablet 650 mg  650 mg Oral Q4H PRN Luanne Bras, MD   650 mg at 12/02/16 1825   Or  . acetaminophen (TYLENOL) solution 650 mg  650 mg Per Tube Q4H PRN Deveshwar, Willaim Rayas, MD       Or  . acetaminophen (TYLENOL) suppository 650 mg  650 mg Rectal Q4H PRN Deveshwar, Sanjeev, MD      . FLUoxetine (PROZAC) capsule 40 mg  40 mg Oral Daily Rosalin Hawking, MD   40 mg at 12/03/16 4098  . hydrALAZINE (APRESOLINE) injection 10 mg  10 mg Intravenous Q4H PRN Rosalin Hawking, MD      . ipratropium-albuterol (DUONEB) 0.5-2.5 (3) MG/3ML nebulizer solution 3 mL  3 mL Nebulization Q6H PRN Rosalin Hawking, MD   3 mL at 12/02/16 1841  . levothyroxine (SYNTHROID, LEVOTHROID) tablet 75 mcg  75 mcg Oral QAC breakfast Rosalin Hawking, MD   75 mcg at 12/03/16 1191  . metoprolol tartrate (LOPRESSOR) tablet 25 mg  25 mg Oral BID Rosalin Hawking, MD   25 mg at  12/03/16 4782  . morphine (MS CONTIN) 12 hr tablet 15 mg  15 mg Oral Q12H Rosalin Hawking, MD   15 mg at 12/03/16 9562  . nortriptyline (PAMELOR) capsule 20 mg  20 mg Oral QHS Rosalin Hawking, MD   20 mg at 12/02/16 2125  . ondansetron (ZOFRAN) injection 4 mg  4 mg Intravenous Q6H PRN Deveshwar, Sanjeev, MD      . pantoprazole (PROTONIX) EC tablet 40 mg  40 mg Oral Daily Rosalin Hawking, MD   40 mg at 12/02/16 2125  . pneumococcal 23 valent vaccine (PNU-IMMUNE) injection 0.5 mL  0.5 mL Intramuscular Tomorrow-1000 Antony Contras S, MD      . QUEtiapine (SEROQUEL) tablet 25 mg  25 mg Oral Q6H PRN Wallie Char   25 mg at 12/02/16 0450  . [place]   Oral PRN Garvin Fila, MD      . senna-docusate (Senokot-S) tablet 1 tablet  1 tablet Oral QHS PRN Rogue Jury, MD      . simvastatin (ZOCOR) tablet 20 mg  20 mg Oral q1800 Rosalin Hawking, MD   20 mg at 12/02/16 1726  . vitamin B-12 (CYANOCOBALAMIN) tablet 100 mcg  100 mcg Oral Daily Rosalin Hawking, MD   100 mcg at 12/03/16 1308     Discharge Medications: Please see discharge summary for a list of discharge medications.  Relevant Imaging Results:  Relevant Lab Results:   Additional Information SS#: [id number]  Geralynn Ochs, LCSW

## 2016-12-03 NOTE — Progress Notes (Signed)
Retta Diones, RN Rehab Admission Coordinator Signed Physical Medicine and Rehabilitation  PMR Pre-admission Date of Service: 12/03/2016 1:49 PM  Related encounter: ED to Hosp-Admission (Current) from 11/29/2016 in [facility]       '[]'$ Hide copied text PMR Admission Coordinator Pre-Admission Assessment  Patient: Valerie Mcclure is an 76 y.o., female MRN: [id number] DOB: 1940-09-12 Height: '5\' 3"'$  (160 cm) Weight: 79.5 kg (175 lb 4.3 oz)                                                                                                                                                  Insurance Information HMO: Yes    PPO:       PCP:       IPA:       80/20:       OTHER:  Group # [id number] PRIMARY: [employer] Medicare      Policy#: [id number]      Subscriber: Irena Reichmann CM Name: Vevelyn Royals      Phone#: [phone]     Fax#: [phone] Pre-Cert#: [id number]      Employer: Retired Benefits:  Phone #:  [phone]     Name:  Hanley Seamen. Date: 07/20/16     Deduct:  $0      Out of Pocket Max:  $4400 (met $152.95)      Life Max: N/A CIR: $345 days 1-5      SNF: $0 days 1-20; $160 days 21-48; $0 days 49-100 Outpatient:  Medical necessity     Co-Pay: $40/visit Home Health: 100%      Co-Pay: none DME: 80%     Co-Pay: 20% Providers: in network  Medicaid Application Date:        Case Manager:   Disability Application Date:        Case Worker:    Emergency Tax adviser Information    Name Relation Home Work [place] [phone]     Jim Like Daughter [phone]     [facility] Relative   [phone]     Current Medical History  Patient Admitting Diagnosis:  L MCA infarct  History of Present Illness: A 76 y.o.femalewith history of chronic LBP, CKD, hyperlipidemia who fell out of bed on 5/13 am and was found to have left facial weakness with slurred speech and right sided weakness. CT head  done revealing hyperdense L-MCA sign with hypodensity in left temporal area. CTA showed left M1 occlusion due to acute thrombus or embolus with significant penumbra left temporoparietal lobe and irregular non-calcified plaque L-ICA. She underwent cerebral angio with complete revascularization of L-MCA with reperfusion. Follow up CCT showed hemorrhagic transformation of left temporal infarct, large area of hemorrhage and extravasated contrast in left basal ganglia, minimal SDH along tentorium and 6 mm  midline shift to right. MRI brain L-MCA infarct with diffusion restriction left temporal lobe, superimposed 5.5 cm intra-axial hemorrhage centered at left basal ganglia with associated left hemisphere edema, effaced left lateral ventricle with trace IVH and occasional small foci of restriction in left thalamus and anterior left occipital lobe.   She was extubated 5/14 pm and therapy evaluations completed yesterday. Dr. Acie Fredrickson consulted for input on new onset A fib on telemetry. EKG without evidence of A fib and felt that patient might be having bouts of A fib due to stress v/s sinus arrhthymias. Question event monitor v/s loop to evaluate for arrhthymias. BSS showed signs of dysphagia and concerns for aspiration--NPO recommended. Speech evaluation revealed moderate dysarthria with expressive >receptive aphasia with phonemic paraphasias and anomia. CIR recommended for follow up therapy.     Total: 11=NIH  Past Medical History      Past Medical History:  Diagnosis Date  . Blood transfusion without reported diagnosis   . Chronic low back pain   . Hyperlipidemia   . Osteopenia   . Unspecified essential hypertension   . Unspecified hypothyroidism     Family History  family history includes Heart disease (age of onset: 27) in her father; Lung cancer (age of onset: 37) in her brother.  Prior Rehab/Hospitalizations: No previous rehab  Has the patient had major surgery during 100 days  prior to admission? No  Current Medications   Current Facility-Administered Medications:  .   stroke: mapping our early stages of recovery book, , Does not apply, Once, Rogue Jury, MD .  acetaminophen (TYLENOL) tablet 650 mg, 650 mg, Oral, Q4H PRN, 650 mg at 12/02/16 1825 **OR** acetaminophen (TYLENOL) solution 650 mg, 650 mg, Per Tube, Q4H PRN **OR** acetaminophen (TYLENOL) suppository 650 mg, 650 mg, Rectal, Q4H PRN, Deveshwar, Sanjeev, MD .  FLUoxetine (PROZAC) capsule 40 mg, 40 mg, Oral, Daily, Rosalin Hawking, MD, 40 mg at 12/03/16 6962 .  hydrALAZINE (APRESOLINE) injection 10 mg, 10 mg, Intravenous, Q4H PRN, Rosalin Hawking, MD .  ipratropium-albuterol (DUONEB) 0.5-2.5 (3) MG/3ML nebulizer solution 3 mL, 3 mL, Nebulization, Q6H PRN, Rosalin Hawking, MD, 3 mL at 12/02/16 1841 .  levothyroxine (SYNTHROID, LEVOTHROID) tablet 75 mcg, 75 mcg, Oral, QAC breakfast, Rosalin Hawking, MD, 75 mcg at 12/03/16 9528 .  metoprolol tartrate (LOPRESSOR) tablet 25 mg, 25 mg, Oral, BID, Rosalin Hawking, MD, 25 mg at 12/03/16 4132 .  morphine (MS CONTIN) 12 hr tablet 15 mg, 15 mg, Oral, Q12H, Rosalin Hawking, MD, 15 mg at 12/03/16 4401 .  nortriptyline (PAMELOR) capsule 20 mg, 20 mg, Oral, QHS, Rosalin Hawking, MD, 20 mg at 12/02/16 2125 .  ondansetron (ZOFRAN) injection 4 mg, 4 mg, Intravenous, Q6H PRN, Deveshwar, Sanjeev, MD .  pantoprazole (PROTONIX) EC tablet 40 mg, 40 mg, Oral, Daily, Rosalin Hawking, MD, 40 mg at 12/02/16 2125 .  pneumococcal 23 valent vaccine (PNU-IMMUNE) injection 0.5 mL, 0.5 mL, Intramuscular, Tomorrow-1000, Sethi, Pramod S, MD .  QUEtiapine (SEROQUEL) tablet 25 mg, 25 mg, Oral, Q6H PRN, Wallie Char, 25 mg at 12/02/16 0450 .  RESOURCE THICKENUP CLEAR, , Oral, PRN, Garvin Fila, MD .  senna-docusate (Senokot-S) tablet 1 tablet, 1 tablet, Oral, QHS PRN, Rogue Jury, MD .  simvastatin (ZOCOR) tablet 20 mg, 20 mg, Oral, q1800, Rosalin Hawking, MD, 20 mg at 12/02/16 1726 .  vitamin B-12  (CYANOCOBALAMIN) tablet 100 mcg, 100 mcg, Oral, Daily, Rosalin Hawking, MD, 100 mcg at 12/03/16 0272  Patients Current Diet: DIET - DYS 1 Room service appropriate?  Yes; Fluid consistency: Honey Thick Diet NPO time specified Diet NPO time specified Except for: Sips with Meds  Precautions / Restrictions Precautions Precautions: Fall Restrictions Weight Bearing Restrictions: No   Has the patient had 2 or more falls or a fall with injury in the past year?Yes.  Family reports about 7 falls with no injury.  Prior Activity Level Community (5-7x/wk): Went out daily, was driving.  Home Assistive Devices / Equipment Home Assistive Devices/Equipment: Cane (specify quad or straight) Home Equipment:  (TBA)  Prior Device Use: Indicate devices/aids used by the patient prior to current illness, exacerbation or injury? Cane  Prior Functional Level Prior Function Level of Independence: Independent Comments: Pt indicates she drives and was fully independent.  Family not available to confirm   Self Care: Did the patient need help bathing, dressing, using the toilet or eating?  Independent  Indoor Mobility: Did the patient need assistance with walking from room to room (with or without device)? Independent  Stairs: Did the patient need assistance with internal or external stairs (with or without device)? Dependent  Functional Cognition: Did the patient need help planning regular tasks such as shopping or remembering to take medications? Independent  Current Functional Level Cognition  Arousal/Alertness: Awake/alert Overall Cognitive Status: Difficult to assess Difficult to assess due to: Impaired communication Orientation Level: Oriented to person, Oriented to place, Disoriented to place, Disoriented to time General Comments: Pt follows one step commands consistently with gestural cues for more complex commands or multi step commands. Expressive aphasia noted. Attention:  Sustained Sustained Attention: Appears intact Safety/Judgment: Impaired    Extremity Assessment (includes Sensation/Coordination)  Upper Extremity Assessment: RUE deficits/detail RUE Deficits / Details: PROM WFL.  She is able to initiate small excursion active flex/ext elbow and hand i RUE Sensation: decreased light touch RUE Coordination: decreased fine motor, decreased gross motor  Lower Extremity Assessment: Defer to PT evaluation RLE Deficits / Details: gross strength in extension >=3/5, but uncoordinated movement and pt unable to execute to verbal command only.  Active movement at df/pf, gross flexion 3-/5, movement in synergy. RLE Coordination: decreased fine motor LLE Deficits / Details: WFL, but difficult to fully assess due to not following verbal cues well.  pt will mimick movement    ADLs  Overall ADL's : Needs assistance/impaired Eating/Feeding: NPO Grooming: Wash/dry hands, Wash/dry face, Oral care, Brushing hair, Total assistance, Sitting Grooming Details (indicate cue type and reason): when cued, pt will move comb to head, but was too fatigued to complete activity  Upper Body Bathing: Moderate assistance, Sitting Lower Body Bathing: Maximal assistance, Sit to/from stand Upper Body Dressing : Maximal assistance, Sitting Lower Body Dressing: Total assistance, Sit to/from stand Toilet Transfer: Moderate assistance, +2 for physical assistance, Stand-pivot, BSC Toileting- Clothing Manipulation and Hygiene: Total assistance, Sit to/from stand Functional mobility during ADLs: Moderate assistance, +2 for physical assistance General ADL Comments: Pt limited by Rt hemiparesis, impaired balance, Rt inattention, and apraxia     Mobility  Overal bed mobility: Needs Assistance Bed Mobility: Supine to Sit Supine to sit: Mod assist, HOB elevated General bed mobility comments: Able to bring RLE to EOB with increased time and cues; use of rail and assist to elevate trunk to get to  EOB. Listing right initially but able to correct with Min A.    Transfers  Overall transfer level: Needs assistance Equipment used: 2 person hand held assist Transfers: Sit to/from Stand Sit to Stand: Mod assist, +2 safety/equipment, +2 physical assistance General transfer comment: Assist to  power-up to full standing position. RUE supported fully and assist with gait belt for hip and knee extension.     Ambulation / Gait / Stairs / Wheelchair Mobility  Ambulation/Gait Ambulation/Gait assistance: Mod assist, +2 physical assistance Ambulation Distance (Feet): 12 Feet Assistive device: 2 person hand held assist Gait Pattern/deviations: Step-to pattern, Decreased stride length, Trunk flexed, Narrow base of support, Decreased dorsiflexion - right, Decreased step length - right General Gait Details: Tech in front with BUE support, and therapist in back guiding hips, assisting with weight shift, and advancing RLE.  Gait velocity: decreased Gait velocity interpretation: Below normal speed for age/gender    Posture / Balance Dynamic Sitting Balance Sitting balance - Comments: Tended to list R with R LE extending in response to her balance reactions; but able to self correct with Min A and cues. Balance Overall balance assessment: Needs assistance Sitting-balance support: Feet supported, Single extremity supported Sitting balance-Leahy Scale: Poor Sitting balance - Comments: Tended to list R with R LE extending in response to her balance reactions; but able to self correct with Min A and cues. Standing balance support: Bilateral upper extremity supported, During functional activity Standing balance-Leahy Scale: Poor Standing balance comment: Having difficulty weight shifring left and tended to list right needing truncal and RLE support to maintain good BoS. Cues for hip extension and upright posture as well.    Special needs/care consideration BiPAP/CPAP No CPM No Continuous Drip IV 0.9%  NS at 50 mL/hr  Dialysis No       Life Vest No Oxygen No Special Bed No Trach Size no Wound Vac (area) No    Skin No                               Bowel mgmt: Last BM 12/02/16 Bladder mgmt: Voiding with incontinence Diabetic mgmt No    Previous Home Environment Living Arrangements: Spouse/significant other, Children  Lives With: Spouse, Daughter Available Help at Discharge: Family Type of Home: House Home Layout: One level Bathroom Shower/Tub: Tub/shower unit Home Care Services: No Additional Comments: pt able to answer via yes/no questions inconsistently.  No family present during eval   Discharge Living Setting Plans for Discharge Living Setting: Lives with (comment), Apartment (Lives with husband.) Type of Home at Discharge: Apartment Discharge Home Layout: One level Discharge Home Access: Level entry Does the patient have any problems obtaining your medications?: No  Social/Family/Support Systems Patient Roles: Spouse, Parent (Has a husband, a daughter, step daughter, grandson.) Contact Information: carmon brigandi - spouse - [phone] Anticipated Caregiver: Dtr, step daughter and spouse Anticipated Caregiver's Contact Information: Vergia Alberts - step daughter - [phone] Ability/Limitations of Caregiver: Husband is retired and can assist.  Dtr and step daughter can alternate. Caregiver Availability: 24/7 Discharge Plan Discussed with Primary Caregiver: Yes Is Caregiver In Agreement with Plan?: Yes Does Caregiver/Family have Issues with Lodging/Transportation while Pt is in Rehab?: No  Goals/Additional Needs Patient/Family Goal for Rehab: PT/OT/SLP supervision to min assist goals Expected length of stay: 19-22 days Cultural Considerations: [employer] Dietary Needs: Dys 1, honey thick liquids Equipment Needs: TBD Pt/Family Agrees to Admission and willing to participate: Yes (Spoke with dtr, step daughter and a grandson.) Program Orientation Provided & Reviewed  with Pt/Caregiver Including Roles  & Responsibilities: Yes  Decrease burden of Care through IP rehab admission: N/A  Possible need for SNF placement upon discharge: No, family have refused SNF placement thus far  Patient Condition: This  patient's medical and functional status has changed since the consult dated: 12/01/16 in which the Rehabilitation Physician determined and documented that the patient's condition is appropriate for intensive rehabilitative care in an inpatient rehabilitation facility. See "History of Present Illness" (above) for medical update. Functional changes are:  Currently requiring mod assist to ambulate 12 feet +2 HHA. Patient's medical and functional status update has been discussed with the Rehabilitation physician and patient remains appropriate for inpatient rehabilitation. Will admit to inpatient rehab today.  Preadmission Screen Completed By:  Retta Diones, 12/03/2016 1:59 PM ______________________________________________________________________   Discussed status with Dr. Letta Pate on 12/03/16 at 1411 and received telephone approval for admission today.  Admission Coordinator:  Retta Diones, time 1411/Date 12/03/16       Cosigned by: Charlett Blake, MD at 12/03/2016 2:20 PM  Revision History

## 2016-12-03 NOTE — Progress Notes (Signed)
Physical Therapy Treatment Patient Details Name: Valerie Mcclure MRN: [id number] DOB: 1941/07/16 Today's Date: 12/03/2016    History of Present Illness Pt is a 76 y.o. female with history of chronic low back pain, HTN, HLD, hypothyroidism presenting with aphasia and right hemiplegia on awakening. She did not receive IV t-PA due to delay in arrival but was taken to interventional neuroradiology was she received  TICI3 revascularization of the occluded left MCA with mechanical thrombectomy. Patient with post IR hemorrhage. MRI showed moderate sized L MCA infarct with basal ganglia hematoma with slight intraventricular extension and cytotoxic edema with slight 7 mm of midline shift. She was intubated 5/13-5/14.     PT Comments    Pt progressing towards physical therapy goals. Was able to perform transfers and ambulation with +2 assist for balance support and safety. Pt was more successful with gait training with ant>posterior support vs L>R support. Some difficulty executing therapeutic exercise due to motor apraxia but overall good rehab effort today. Will continue to follow and progress as able per POC.   Follow Up Recommendations  CIR     Equipment Recommendations  Other (comment) (TBD by next venue of care)    Recommendations for Other Services Rehab consult     Precautions / Restrictions Precautions Precautions: Fall Restrictions Weight Bearing Restrictions: No    Mobility  Bed Mobility Overal bed mobility: Needs Assistance Bed Mobility: Supine to Sit     Supine to sit: Mod assist;HOB elevated     General bed mobility comments: Able to bring RLE to EOB with increased time and cues; use of rail and assist to elevate trunk to get to EOB. Listing right initially but able to correct with Min A.  Transfers Overall transfer level: Needs assistance Equipment used: 2 person hand held assist Transfers: Sit to/from Stand Sit to Stand: Mod assist;+2 safety/equipment;+2 physical  assistance         General transfer comment: Assist to power-up to full standing position. RUE supported fully and assist with gait belt for hip and knee extension.   Ambulation/Gait Ambulation/Gait assistance: Mod assist;+2 physical assistance Ambulation Distance (Feet): 12 Feet Assistive device: 2 person hand held assist Gait Pattern/deviations: Step-to pattern;Decreased stride length;Trunk flexed;Narrow base of support;Decreased dorsiflexion - right;Decreased step length - right Gait velocity: decreased Gait velocity interpretation: Below normal speed for age/gender General Gait Details: Tech in front with BUE support, and therapist in back guiding hips, assisting with weight shift, and advancing RLE.    Stairs            Wheelchair Mobility    Modified Rankin (Stroke Patients Only) Modified Rankin (Stroke Patients Only) Pre-Morbid Rankin Score: No symptoms Modified Rankin: Moderately severe disability     Balance Overall balance assessment: Needs assistance Sitting-balance support: Feet supported;Single extremity supported Sitting balance-Leahy Scale: Poor Sitting balance - Comments: Tended to list R with R LE extending in response to her balance reactions; but able to self correct with Min A and cues.   Standing balance support: Bilateral upper extremity supported;During functional activity Standing balance-Leahy Scale: Poor Standing balance comment: Having difficulty weight shifring left and tended to list right needing truncal and RLE support to maintain good BoS. Cues for hip extension and upright posture as well.                            Cognition Arousal/Alertness: Awake/alert Behavior During Therapy: WFL for tasks assessed/performed Overall Cognitive Status: Difficult to assess  General Comments: Pt follows one step commands consistently with gestural cues for more complex commands or multi step  commands. Expressive aphasia noted.      Exercises General Exercises - Lower Extremity Quad Sets: 10 reps Long Arc Quad: 10 reps Hip ABduction/ADduction: 10 reps (isometric adduction)    General Comments General comments (skin integrity, edema, etc.): Noted motor apraxias. Pt appeared to understand the command, but unable to execute.      Pertinent Vitals/Pain Pain Assessment: Faces Faces Pain Scale: No hurt    Home Living                      Prior Function            PT Goals (current goals can now be found in the care plan section) Acute Rehab PT Goals Patient Stated Goal: Pt unable to state  PT Goal Formulation: With patient Time For Goal Achievement: 12/15/16 Potential to Achieve Goals: Good Progress towards PT goals: Progressing toward goals    Frequency    Min 4X/week      PT Plan Current plan remains appropriate    Co-evaluation              AM-PAC PT "6 Clicks" Daily Activity  Outcome Measure  Difficulty turning over in bed (including adjusting bedclothes, sheets and blankets)?: Total Difficulty moving from lying on back to sitting on the side of the bed? : Total Difficulty sitting down on and standing up from a chair with arms (e.g., wheelchair, bedside commode, etc,.)?: Total Help needed moving to and from a bed to chair (including a wheelchair)?: A Lot Help needed walking in hospital room?: A Lot Help needed climbing 3-5 steps with a railing? : Total 6 Click Score: 8    End of Session Equipment Utilized During Treatment: Gait belt Activity Tolerance: Patient tolerated treatment well Patient left: in chair;with call bell/phone within reach;with family/visitor present;with chair alarm set Nurse Communication: Mobility status PT Visit Diagnosis: Hemiplegia and hemiparesis;Apraxia (R48.2) Hemiplegia - Right/Left: Right Hemiplegia - dominant/non-dominant: Dominant Hemiplegia - caused by: Cerebral infarction     Time:  1610-9604 PT Time Calculation (min) (ACUTE ONLY): 30 min  Charges:  $Gait Training: 8-22 mins $Therapeutic Exercise: 8-22 mins                    G Codes:       Rolinda Roan, PT, DPT Acute Rehabilitation Services Pager: [place] 12/03/2016, 1:20 PM

## 2016-12-03 NOTE — PMR Pre-admission (Signed)
PMR Admission Coordinator Pre-Admission Assessment  Patient: Valerie Mcclure is an 76 y.o., female MRN: [id number] DOB: 08-08-1940 Height: '5\' 3"'$  (160 cm) Weight: 79.5 kg (175 lb 4.3 oz)              Insurance Information HMO: Yes    PPO:       PCP:       IPA:       80/20:       OTHER:  Group # [id number] PRIMARY: [employer] Medicare      Policy#: [id number]      Subscriber: [place] Name: Vevelyn Royals      Phone#: [phone]     Fax#: [phone] Pre-Cert#: [id number]      Employer: Retired Benefits:  Phone #:  [phone]     Name:  Hanley Seamen. Date: 07/20/16     Deduct:  $0      Out of Pocket Max:  $4400 (met $152.95)      Life Max: N/A CIR: $345 days 1-5      SNF: $0 days 1-20; $160 days 21-48; $0 days 49-100 Outpatient:  Medical necessity     Co-Pay: $40/visit Home Health: 100%      Co-Pay: none DME: 80%     Co-Pay: 20% Providers: in network  Medicaid Application Date:        Case Manager:   Disability Application Date:        Case Worker:    Emergency Facilities manager Information    Name Relation Home Work [place] [phone]     Jim Like Daughter [phone]     [facility] Relative   [phone]     Current Medical History  Patient Admitting Diagnosis:  L MCA infarct  History of Present Illness: A 76 y.o.femalewith history of chronic LBP, CKD, hyperlipidemia who fell out of bed on 5/13 am and was found to have left facial weakness with slurred speech and right sided weakness. CT head done revealing hyperdense L-MCA sign with hypodensity in left temporal area. CTA showed left M1 occlusion due to acute thrombus or embolus with significant penumbra left temporoparietal lobe and irregular non-calcified plaque L-ICA. She underwent cerebral angio with complete revascularization of L-MCA with reperfusion. Follow up CCT showed hemorrhagic transformation of left temporal infarct, large area of hemorrhage and extravasated contrast in  left basal ganglia, minimal SDH along tentorium and 6 mm midline shift to right. MRI brain L-MCA infarct with diffusion restriction left temporal lobe, superimposed 5.5 cm intra-axial hemorrhage centered at left basal ganglia with associated left hemisphere edema, effaced left lateral ventricle with trace IVH and occasional small foci of restriction in left thalamus and anterior left occipital lobe.   She was extubated 5/14 pm and therapy evaluations completed yesterday. Dr. Acie Fredrickson consulted for input on new onset A fib on telemetry. EKG without evidence of A fib and felt that patient might be having bouts of A fib due to stress v/s sinus arrhthymias. Question event monitor v/s loop to evaluate for arrhthymias. BSS showed signs of dysphagia and concerns for aspiration--NPO recommended. Speech evaluation revealed moderate dysarthria with expressive >receptive aphasia with phonemic paraphasias and anomia. CIR recommended for follow up therapy.     Total: 11=NIH  Past Medical History  Past Medical History:  Diagnosis Date  . Blood transfusion without reported diagnosis   . Chronic low back pain   . Hyperlipidemia   . Osteopenia   . Unspecified essential hypertension   . Unspecified hypothyroidism  Family History  family history includes Heart disease (age of onset: 48) in her father; Lung cancer (age of onset: 47) in her brother.  Prior Rehab/Hospitalizations: No previous rehab  Has the patient had major surgery during 100 days prior to admission? No  Current Medications   Current Facility-Administered Medications:  .   stroke: mapping our early stages of recovery book, , Does not apply, Once, Rogue Jury, MD .  acetaminophen (TYLENOL) tablet 650 mg, 650 mg, Oral, Q4H PRN, 650 mg at 12/02/16 1825 **OR** acetaminophen (TYLENOL) solution 650 mg, 650 mg, Per Tube, Q4H PRN **OR** acetaminophen (TYLENOL) suppository 650 mg, 650 mg, Rectal, Q4H PRN, Deveshwar, Sanjeev, MD .   FLUoxetine (PROZAC) capsule 40 mg, 40 mg, Oral, Daily, Rosalin Hawking, MD, 40 mg at 12/03/16 1610 .  hydrALAZINE (APRESOLINE) injection 10 mg, 10 mg, Intravenous, Q4H PRN, Rosalin Hawking, MD .  ipratropium-albuterol (DUONEB) 0.5-2.5 (3) MG/3ML nebulizer solution 3 mL, 3 mL, Nebulization, Q6H PRN, Rosalin Hawking, MD, 3 mL at 12/02/16 1841 .  levothyroxine (SYNTHROID, LEVOTHROID) tablet 75 mcg, 75 mcg, Oral, QAC breakfast, Rosalin Hawking, MD, 75 mcg at 12/03/16 9604 .  metoprolol tartrate (LOPRESSOR) tablet 25 mg, 25 mg, Oral, BID, Rosalin Hawking, MD, 25 mg at 12/03/16 5409 .  morphine (MS CONTIN) 12 hr tablet 15 mg, 15 mg, Oral, Q12H, Rosalin Hawking, MD, 15 mg at 12/03/16 8119 .  nortriptyline (PAMELOR) capsule 20 mg, 20 mg, Oral, QHS, Rosalin Hawking, MD, 20 mg at 12/02/16 2125 .  ondansetron (ZOFRAN) injection 4 mg, 4 mg, Intravenous, Q6H PRN, Deveshwar, Sanjeev, MD .  pantoprazole (PROTONIX) EC tablet 40 mg, 40 mg, Oral, Daily, Rosalin Hawking, MD, 40 mg at 12/02/16 2125 .  pneumococcal 23 valent vaccine (PNU-IMMUNE) injection 0.5 mL, 0.5 mL, Intramuscular, Tomorrow-1000, Sethi, Pramod S, MD .  QUEtiapine (SEROQUEL) tablet 25 mg, 25 mg, Oral, Q6H PRN, Wallie Char, 25 mg at 12/02/16 0450 .  RESOURCE THICKENUP CLEAR, , Oral, PRN, Garvin Fila, MD .  senna-docusate (Senokot-S) tablet 1 tablet, 1 tablet, Oral, QHS PRN, Rogue Jury, MD .  simvastatin (ZOCOR) tablet 20 mg, 20 mg, Oral, q1800, Rosalin Hawking, MD, 20 mg at 12/02/16 1726 .  vitamin B-12 (CYANOCOBALAMIN) tablet 100 mcg, 100 mcg, Oral, Daily, Rosalin Hawking, MD, 100 mcg at 12/03/16 1478  Patients Current Diet: DIET - DYS 1 Room service appropriate? Yes; Fluid consistency: Honey Thick Diet NPO time specified Diet NPO time specified Except for: Sips with Meds  Precautions / Restrictions Precautions Precautions: Fall Restrictions Weight Bearing Restrictions: No   Has the patient had 2 or more falls or a fall with injury in the past year?Yes.  Family  reports about 7 falls with no injury.  Prior Activity Level Community (5-7x/wk): Went out daily, was driving.  Home Assistive Devices / Equipment Home Assistive Devices/Equipment: Cane (specify quad or straight) Home Equipment:  (TBA)  Prior Device Use: Indicate devices/aids used by the patient prior to current illness, exacerbation or injury? Cane  Prior Functional Level Prior Function Level of Independence: Independent Comments: Pt indicates she drives and was fully independent.  Family not available to confirm   Self Care: Did the patient need help bathing, dressing, using the toilet or eating?  Independent  Indoor Mobility: Did the patient need assistance with walking from room to room (with or without device)? Independent  Stairs: Did the patient need assistance with internal or external stairs (with or without device)? Dependent  Functional Cognition: Did the patient need help planning regular  tasks such as shopping or remembering to take medications? Independent  Current Functional Level Cognition  Arousal/Alertness: Awake/alert Overall Cognitive Status: Difficult to assess Difficult to assess due to: Impaired communication Orientation Level: Oriented to person, Oriented to place, Disoriented to place, Disoriented to time General Comments: Pt follows one step commands consistently with gestural cues for more complex commands or multi step commands. Expressive aphasia noted. Attention: Sustained Sustained Attention: Appears intact Safety/Judgment: Impaired    Extremity Assessment (includes Sensation/Coordination)  Upper Extremity Assessment: RUE deficits/detail RUE Deficits / Details: PROM WFL.  She is able to initiate small excursion active flex/ext elbow and hand i RUE Sensation: decreased light touch RUE Coordination: decreased fine motor, decreased gross motor  Lower Extremity Assessment: Defer to PT evaluation RLE Deficits / Details: gross strength in extension  >=3/5, but uncoordinated movement and pt unable to execute to verbal command only.  Active movement at df/pf, gross flexion 3-/5, movement in synergy. RLE Coordination: decreased fine motor LLE Deficits / Details: WFL, but difficult to fully assess due to not following verbal cues well.  pt will mimick movement    ADLs  Overall ADL's : Needs assistance/impaired Eating/Feeding: NPO Grooming: Wash/dry hands, Wash/dry face, Oral care, Brushing hair, Total assistance, Sitting Grooming Details (indicate cue type and reason): when cued, pt will move comb to head, but was too fatigued to complete activity  Upper Body Bathing: Moderate assistance, Sitting Lower Body Bathing: Maximal assistance, Sit to/from stand Upper Body Dressing : Maximal assistance, Sitting Lower Body Dressing: Total assistance, Sit to/from stand Toilet Transfer: Moderate assistance, +2 for physical assistance, Stand-pivot, BSC Toileting- Clothing Manipulation and Hygiene: Total assistance, Sit to/from stand Functional mobility during ADLs: Moderate assistance, +2 for physical assistance General ADL Comments: Pt limited by Rt hemiparesis, impaired balance, Rt inattention, and apraxia     Mobility  Overal bed mobility: Needs Assistance Bed Mobility: Supine to Sit Supine to sit: Mod assist, HOB elevated General bed mobility comments: Able to bring RLE to EOB with increased time and cues; use of rail and assist to elevate trunk to get to EOB. Listing right initially but able to correct with Min A.    Transfers  Overall transfer level: Needs assistance Equipment used: 2 person hand held assist Transfers: Sit to/from Stand Sit to Stand: Mod assist, +2 safety/equipment, +2 physical assistance General transfer comment: Assist to power-up to full standing position. RUE supported fully and assist with gait belt for hip and knee extension.     Ambulation / Gait / Stairs / Wheelchair Mobility  Ambulation/Gait Ambulation/Gait  assistance: Mod assist, +2 physical assistance Ambulation Distance (Feet): 12 Feet Assistive device: 2 person hand held assist Gait Pattern/deviations: Step-to pattern, Decreased stride length, Trunk flexed, Narrow base of support, Decreased dorsiflexion - right, Decreased step length - right General Gait Details: Tech in front with BUE support, and therapist in back guiding hips, assisting with weight shift, and advancing RLE.  Gait velocity: decreased Gait velocity interpretation: Below normal speed for age/gender    Posture / Balance Dynamic Sitting Balance Sitting balance - Comments: Tended to list R with R LE extending in response to her balance reactions; but able to self correct with Min A and cues. Balance Overall balance assessment: Needs assistance Sitting-balance support: Feet supported, Single extremity supported Sitting balance-Leahy Scale: Poor Sitting balance - Comments: Tended to list R with R LE extending in response to her balance reactions; but able to self correct with Min A and cues. Standing balance support: Bilateral upper  extremity supported, During functional activity Standing balance-Leahy Scale: Poor Standing balance comment: Having difficulty weight shifring left and tended to list right needing truncal and RLE support to maintain good BoS. Cues for hip extension and upright posture as well.    Special needs/care consideration BiPAP/CPAP No CPM No Continuous Drip IV 0.9% NS at 50 mL/hr  Dialysis No       Life Vest No Oxygen No Special Bed No Trach Size no Wound Vac (area) No    Skin No                               Bowel mgmt: Last BM 12/02/16 Bladder mgmt: Voiding with incontinence Diabetic mgmt No    Previous Home Environment Living Arrangements: Spouse/significant other, Children  Lives With: Spouse, Daughter Available Help at Discharge: Family Type of Home: House Home Layout: One level Bathroom Shower/Tub: Tub/shower unit Home Care Services:  No Additional Comments: pt able to answer via yes/no questions inconsistently.  No family present during eval   Discharge Living Setting Plans for Discharge Living Setting: Lives with (comment), Apartment (Lives with husband.) Type of Home at Discharge: Apartment Discharge Home Layout: One level Discharge Home Access: Level entry Does the patient have any problems obtaining your medications?: No  Social/Family/Support Systems Patient Roles: Spouse, Parent (Has a husband, a daughter, step daughter, grandson.) Contact Information: suzann lazaro - spouse - [phone] Anticipated Caregiver: Dtr, step daughter and spouse Anticipated Caregiver's Contact Information: Vergia Alberts - step daughter - [phone] Ability/Limitations of Caregiver: Husband is retired and can assist.  Dtr and step daughter can alternate. Caregiver Availability: 24/7 Discharge Plan Discussed with Primary Caregiver: Yes Is Caregiver In Agreement with Plan?: Yes Does Caregiver/Family have Issues with Lodging/Transportation while Pt is in Rehab?: No  Goals/Additional Needs Patient/Family Goal for Rehab: PT/OT/SLP supervision to min assist goals Expected length of stay: 19-22 days Cultural Considerations: [employer] Dietary Needs: Dys 1, honey thick liquids Equipment Needs: TBD Pt/Family Agrees to Admission and willing to participate: Yes (Spoke with dtr, step daughter and a grandson.) Program Orientation Provided & Reviewed with Pt/Caregiver Including Roles  & Responsibilities: Yes  Decrease burden of Care through IP rehab admission: N/A  Possible need for SNF placement upon discharge: No, family have refused SNF placement thus far  Patient Condition: This patient's medical and functional status has changed since the consult dated: 12/01/16 in which the Rehabilitation Physician determined and documented that the patient's condition is appropriate for intensive rehabilitative care in an inpatient rehabilitation  facility. See "History of Present Illness" (above) for medical update. Functional changes are:  Currently requiring mod assist to ambulate 12 feet +2 HHA. Patient's medical and functional status update has been discussed with the Rehabilitation physician and patient remains appropriate for inpatient rehabilitation. Will admit to inpatient rehab today.  Preadmission Screen Completed By:  Retta Diones, 12/03/2016 1:59 PM ______________________________________________________________________   Discussed status with Dr. Letta Pate on 12/03/16 at 1411 and received telephone approval for admission today.  Admission Coordinator:  Retta Diones, time 1411/Date 12/03/16

## 2016-12-03 NOTE — Progress Notes (Signed)
  Speech Language Pathology Treatment: Dysphagia;Cognitive-Linquistic  Patient Details Name: Valerie Mcclure MRN: [id number] DOB: 08/09/1940 Today's Date: 12/03/2016 Time: 2119-4174 SLP Time Calculation (min) (ACUTE ONLY): 22 min  Assessment / Plan / Recommendation Clinical Impression  Pt has improving dysarthria, with verbal expression still primarily impacted by her aphasia. She follows one-step commands well. Unstructured conversation is marked by jargon, but when given increased structure and Mod cues she could participate in verbal sequencing task with production at the word to short phrase level. Pt also self-fed trials of Dys 1 textures and honey thick liquids by spoon with Mod cues for awareness of large amounts of right-sided anterior loss. She did not have any overt signs of aspiration, although penetration was silent on FEES. Pt is likely ready for repeat instrumental testing soon in light of other progress made - will defer to CIR as pt is to transfer there this afternoon.   HPI HPI: Ptis a 76 y.o.femalewith history of chronic low back pain, HTN, HLD, hypothyroidism presenting with aphasia and right hemiplegia on awakening. Shedidnot receive IV t-PA due to delay in arrival but was taken to interventional neuroradiology was she received  TICI3 revascularization of the occluded left MCA with mechanical thrombectomy. Patient with post IR hemorrhage. MRI showed moderate sized L MCA infarct with basal ganglia hematoma with slight intraventricular extension and cytotoxic edema with slight 7 mm of midline shift. She was intubated 5/13-5/14.      SLP Plan  Continue with current plan of care       Recommendations  Diet recommendations: Dysphagia 1 (puree);Honey-thick liquid Liquids provided via: Teaspoon Medication Administration: Crushed with puree Supervision: Patient able to self feed;Full supervision/cueing for compensatory strategies Compensations: Slow rate;Small  sips/bites;Monitor for anterior loss;Multiple dry swallows after each bite/sip;Follow solids with liquid Postural Changes and/or Swallow Maneuvers: Seated upright 90 degrees;Upright 30-60 min after meal                Oral Care Recommendations: Oral care BID Follow up Recommendations: Inpatient Rehab SLP Visit Diagnosis: Dysphagia, oropharyngeal phase (R13.12);Aphasia (R47.01) Plan: Continue with current plan of care       GO                Valerie Mcclure 12/03/2016, 3:35 PM  Valerie Mcclure, M.A. CCC-SLP [phone]

## 2016-12-03 NOTE — Progress Notes (Signed)
Patient ID: Valerie Mcclure, female   DOB: May 29, 1941, 76 y.o.   MRN: [id number] Patient arrived from [facility] with RN and family. Patient and family oriented to room, fall prevention plan, rehab safety plan, rehab schedule, and health resource notebook. Patient demonstrated how to use call bell. Patient resting comfortably in bed with SCD's on, family at bedside, and no c/o pain.

## 2016-12-03 NOTE — Progress Notes (Signed)
    [facility] has been requested to perform a transesophageal echocardiogram on Valerie Mcclure for stroke.  After careful review of history and examination, the risks and benefits of transesophageal echocardiogram have been explained including risks of esophageal damage, perforation (1:10,000 risk), bleeding, pharyngeal hematoma as well as other potential complications associated with conscious sedation including aspiration, arrhythmia, respiratory failure and death. Alternatives to treatment were discussed, questions were answered. Patient is willing to proceed.   She is scheduled for TEE tomorrow, 12/04/16, at 0800 with Dr. Radford Pax.  Tami Lin [place], [place]  12/03/2016 9:12 AM

## 2016-12-03 NOTE — Progress Notes (Signed)
Rehab admissions - I have approval for acute inpatient rehab admission for today.  I do have a bed open and will admit to inpatient rehab today.  Patient has been cleared by attending MD to admit to rehab today.  Call me for questions.  #[phone]

## 2016-12-03 NOTE — Progress Notes (Signed)
Report called in to rehab nurse. Family is visiting and updated.

## 2016-12-03 NOTE — Progress Notes (Signed)
Occupational Therapy Treatment Patient Details Name: Valerie Mcclure MRN: [id number] DOB: 1941/01/23 Today's Date: 12/03/2016    History of present illness Pt is a 76 y.o. female with history of chronic low back pain, HTN, HLD, hypothyroidism presenting with aphasia and right hemiplegia on awakening. She did not receive IV t-PA due to delay in arrival but was taken to interventional neuroradiology was she received  TICI3 revascularization of the occluded left MCA with mechanical thrombectomy. Patient with post IR hemorrhage. MRI showed moderate sized L MCA infarct with basal ganglia hematoma with slight intraventricular extension and cytotoxic edema with slight 7 mm of midline shift. She was intubated 5/13-5/14.    OT comments  Pt making good progress towards goals. This session focused on Pt attending to the right including during functional ADL (putting lotion on RUE with LUE and washing RUE with washcloth) and also doing a visual scanning sequence using cue cards with 1 through 6, Pt needed mod verbal cues the first time the task was attempted and then one vc the second time the task was completed. The Pt was scanning to the right without visual cues to find the number next in the sequence. Pt continues to require CIR level therapy to maximize safety and independence in ADL. Next session to focus on functional transfers and weight bearing through RUE during balance/grooming activities.   Follow Up Recommendations  CIR;Supervision/Assistance - 24 hour    Equipment Recommendations  3 in 1 bedside commode;Tub/shower bench    Recommendations for Other Services Rehab consult    Precautions / Restrictions Precautions Precautions: Fall Restrictions Weight Bearing Restrictions: No       Mobility Bed Mobility Overal bed mobility: Needs Assistance Bed Mobility: Supine to Sit     Supine to sit: Mod assist;HOB elevated     General bed mobility comments: Able to bring RLE to EOB with  increased time and cues; use of rail and assist to elevate trunk to get to EOB. Initially with right lean, but able to correct with Min A.  Transfers Overall transfer level: Needs assistance Equipment used: 2 person hand held assist Transfers: Sit to/from Stand Sit to Stand: Mod assist;+2 safety/equipment;+2 physical assistance         General transfer comment: Pending dc to CIR, declined OOB transfer at this time    Balance Overall balance assessment: Needs assistance Sitting-balance support: Feet supported;Single extremity supported Sitting balance-Leahy Scale: Poor Sitting balance - Comments: Tended to list R with R LE extending in response to her balance reactions; but able to self correct with Min A and cues.   Standing balance support: Bilateral upper extremity supported;During functional activity Standing balance-Leahy Scale: Poor Standing balance comment: Having difficulty weight shifring left and tended to list right needing truncal and RLE support to maintain good BoS. Cues for hip extension and upright posture as well.                           ADL either performed or assessed with clinical judgement   ADL Overall ADL's : Needs assistance/impaired     Grooming: Wash/dry hands;Wash/dry face;Moderate assistance (applying lotion) Grooming Details (indicate cue type and reason): Pt attending to RUE with cues, able to put lotion on RUE/wash RUE with washcloth with hand over hand assist, able to wash face with L hand, mod A for thoroughness  Functional mobility during ADLs:  (Patient pending dc to CIR, declined transfer at this time) General ADL Comments: Pt limited by Rt hemiparesis, impaired balance, Rt inattention (improving with vc) and during functional tasks, and apraxia      Vision       Perception     Praxis      Cognition Arousal/Alertness: Awake/alert Behavior During Therapy: WFL for tasks  assessed/performed Overall Cognitive Status: Difficult to assess                                 General Comments: Pt able to identify and sequence large number cards        Exercises Exercises: General Upper Extremity General Exercises - Upper Extremity Shoulder Flexion: PROM;Right;10 reps;Supine (assist provided by OT - to 90 degrees) Shoulder ABduction: PROM;Right;10 reps;Supine Elbow Flexion: PROM;Right;10 reps;Supine Elbow Extension: PROM;Right;10 reps;Supine Wrist Flexion: PROM;Right;5 reps;Supine;Other (comment) (Pt states that this motion hurt, reps not continued) Wrist Extension: PROM;Right;5 reps;Other (comment) (Pt states that this motion hurt, reps not continued) Digit Composite Flexion: PROM;Right;10 reps;Supine Composite Extension: PROM;Right;10 reps;Supine General Exercises - Lower Extremity Quad Sets: 10 reps Long Arc Quad: 10 reps Hip ABduction/ADduction: 10 reps (isometric adduction)   Shoulder Instructions       General Comments Noted motor apraxias. Pt appeared to understand the command, but unable to execute.    Pertinent Vitals/ Pain       Pain Assessment: Faces Faces Pain Scale: Hurts a little bit Pain Location: R wrist Pain Descriptors / Indicators: Sore Pain Intervention(s): Limited activity within patient's tolerance;Monitored during session;Repositioned  Home Living                                          Prior Functioning/Environment              Frequency  Min 2X/week        Progress Toward Goals  OT Goals(current goals can now be found in the care plan section)  Progress towards OT goals: Progressing toward goals  Acute Rehab OT Goals Patient Stated Goal: to get to rehab to gain back her independence OT Goal Formulation: With patient Time For Goal Achievement: 12/15/16 Potential to Achieve Goals: Good  Plan Discharge plan remains appropriate    Co-evaluation                 AM-PAC  PT "6 Clicks" Daily Activity     Outcome Measure   Help from another person eating meals?: A Lot Help from another person taking care of personal grooming?: A Lot Help from another person toileting, which includes using toliet, bedpan, or urinal?: A Lot Help from another person bathing (including washing, rinsing, drying)?: A Lot Help from another person to put on and taking off regular upper body clothing?: Total Help from another person to put on and taking off regular lower body clothing?: Total 6 Click Score: 10    End of Session    OT Visit Diagnosis: Hemiplegia and hemiparesis Hemiplegia - Right/Left: Right Hemiplegia - dominant/non-dominant: Dominant Hemiplegia - caused by: Cerebral infarction   Activity Tolerance Patient tolerated treatment well   Patient Left in bed;with call bell/phone within reach;Other (comment) (with SLP coming into room)   Nurse Communication Mobility status        Time: 6301-6010 OT Time Calculation (min): 21  min  Charges: OT General Charges $OT Visit: 1 Procedure OT Treatments $Self Care/Home Management : 8-22 mins  Hulda Humphrey OTR/L [place] 12/03/2016, 3:19 PM

## 2016-12-03 NOTE — Care Management Note (Signed)
Case Management Note  Patient Details  Name: Valerie Mcclure MRN: [id number] Date of Birth: 02-May-1941  Subjective/Objective:                    Action/Plan: Pt discharging to CIR today. No further needs per CM.   Expected Discharge Date:  12/03/16               Expected Discharge Plan:  [place]  In-House Referral:     Discharge planning Services  CM Consult  Post Acute Care Choice:    Choice offered to:     DME Arranged:    DME Agency:     HH Arranged:    HH Agency:     Status of Service:  Completed, signed off  If discussed at [employer], dates discussed:    Additional Comments:  Pollie Friar, RN 12/03/2016, 3:24 PM

## 2016-12-03 NOTE — Progress Notes (Signed)
Progress Note  Patient Name: Valerie Mcclure Date of Encounter: 12/03/2016  Primary Cardiologist: new - Dr. Acie Fredrickson  Subjective   Patient is feeling well; denies chest pain, SOB, and palpitations. Pt speech is better today, she is working hard with therapy.  Inpatient Medications    Scheduled Meds: .  stroke: mapping our early stages of recovery book   Does not apply Once  . FLUoxetine  40 mg Oral Daily  . levothyroxine  75 mcg Oral QAC breakfast  . metoprolol tartrate  25 mg Oral BID  . morphine  15 mg Oral Q12H  . nortriptyline  20 mg Oral QHS  . pantoprazole  40 mg Oral Daily  . pneumococcal 23 valent vaccine  0.5 mL Intramuscular Tomorrow-1000  . simvastatin  20 mg Oral q1800  . vitamin B-12  100 mcg Oral Daily   Continuous Infusions:  PRN Meds: acetaminophen **OR** acetaminophen (TYLENOL) oral liquid 160 mg/5 mL **OR** acetaminophen, hydrALAZINE, ipratropium-albuterol, ondansetron (ZOFRAN) IV, QUEtiapine, RESOURCE THICKENUP CLEAR, senna-docusate   Vital Signs    Vitals:   12/02/16 2119 12/03/16 0043 12/03/16 0618 12/03/16 0822  BP: (!) 135/53 (!) 141/62 (!) 155/65 (!) 156/66  Pulse: 79 74 77 73  Resp: 18 16 18    Temp: 98.8 F (37.1 C) 98.8 F (37.1 C) 98 F (36.7 C)   TempSrc: Oral Oral Oral   SpO2: 98% 100% 97%   Weight:      Height:        Intake/Output Summary (Last 24 hours) at 12/03/16 0838 Last data filed at 12/02/16 1200  Gross per 24 hour  Intake           329.16 ml  Output                0 ml  Net           329.16 ml   Filed Weights   11/29/16 1400 11/29/16 2000  Weight: 164 lb 14.5 oz (74.8 kg) 175 lb 4.3 oz (79.5 kg)     Physical Exam   General: Well developed, well nourished, female appearing in no acute distress. Head: Normocephalic, atraumatic.  Neck: Supple without bruits, no JVD Lungs:  Resp regular and unlabored, CTA. Heart: Irregular rhythm, regular rate, S1, S2, no murmur; no rub. Abdomen: Soft, non-tender,  non-distended with normoactive bowel sounds. No hepatomegaly. No rebound/guarding. No obvious abdominal masses. Extremities: No clubbing, cyanosis, no edema. Distal pedal pulses are 1+ bilaterally. Neuro: Alert and oriented X 3.   Weakness on right side Psych: Normal affect.  Labs    Chemistry Recent Labs Lab 11/29/16 0834  12/01/16 0229 12/02/16 0755 12/03/16 0518  NA 137  < > 144 145 145  K 3.7  < > 3.4* 2.9* 3.8  CL 102  < > 114* 118* 117*  CO2 27  < > 22 19* 21*  GLUCOSE 141*  < > 126* 158* 125*  BUN 24*  < > 17 14 17   CREATININE 1.49*  < > 1.10* 1.02* 1.07*  CALCIUM 8.7*  < > 8.2* 8.1* 8.1*  PROT 6.5  --   --   --   --   ALBUMIN 3.4*  --   --   --   --   AST 29  --   --   --   --   ALT 17  --   --   --   --   ALKPHOS 56  --   --   --   --  BILITOT 0.3  --   --   --   --   GFRNONAA 33*  < > 47* 52* 49*  GFRAA 38*  < > 55* 60* 57*  ANIONGAP 8  < > 8 8 7   < > = values in this interval not displayed.   Hematology Recent Labs Lab 12/01/16 0229 12/02/16 0755 12/03/16 0518  WBC 17.3* 12.4* 9.7  RBC 3.44* 3.33* 3.18*  HGB 10.2* 9.7* 9.1*  HCT 31.8* 31.0* 29.4*  MCV 92.4 93.1 92.5  MCH 29.7 29.1 28.6  MCHC 32.1 31.3 31.0  RDW 15.2 15.7* 15.5  PLT 219 186 199    Cardiac EnzymesNo results for input(s): TROPONINI in the last 168 hours.  Recent Labs Lab 11/29/16 0840  TROPIPOC 1.86*     BNPNo results for input(s): BNP, PROBNP in the last 168 hours.   DDimer No results for input(s): DDIMER in the last 168 hours.   Radiology    No results found.   Telemetry    Sinus rhythm, PACs and PVCs - Personally Reviewed  ECG    No new tracings - Personally Reviewed   Cardiac Studies   Echocardiogram 12/03/16: Study Conclusions - Left ventricle: The cavity size was normal. Wall thickness was   normal. Systolic function was normal. The estimated ejection   fraction was in the range of 55% to 60%. Although no diagnostic   regional wall motion abnormality was  identified, this possibility   cannot be completely excluded on the basis of this study.   Features are consistent with a pseudonormal left ventricular   filling pattern, with concomitant abnormal relaxation and   increased filling pressure (grade 2 diastolic dysfunction). - Aortic valve: There was no stenosis. There was trivial   regurgitation. - Mitral valve: There was mild regurgitation. - Left atrium: The atrium was mildly dilated. - Right ventricle: The cavity size was normal. Systolic function   was normal. - Tricuspid valve: Peak RV-RA gradient (S): 39 mm Hg. - Pulmonary arteries: PA peak pressure: 47 mm Hg (S). - Systemic veins: IVC measured 2.0 cm with < 50% respirophasic   variation, suggesting RA pressure 8 mmHg.  Impressions: - Normal LV size with EF 55-60%. Moderate diastolic dysfunction.   Normal RV size and systolic function. Mild pulmonary   hypertension. Mild mitral regurgitation.  Patient Profile     76 y.o. female with a PMH significant for HTN, lower extremity edema, hypothyroidism, HLD, pre-diabetes, and CKD with baseline sCr 1.4-1.6. She presented to [facility] with symptoms of stroke. Neurology was consulted and she underwent thrombectomy to L MCA with subsequent brain hemorrhage with shift. Cardiology was consulted to evaluate and abnormal rhythm on telemetry.  Assessment & Plan    1 Stroke, evaluating for Afib - no evidence of Afib on telemetry review today - Given her frequent PACs and PVCs, will recommend 30-day holter/event monitor at discharge - CVA management per neurology - no plans for anticoagulation at this time. - lower extremity duplex without DVT   2. Diastolic heart failure - echo with grad 2 DD and elevated PA pressure - pt is not volume overloaded on exam - will monitor fluid status   Signed, Courtney Paris 8:38 AM 12/03/2016 Pager: [phone]  Attending Note:   The patient was seen and examined.  Agree with assessment  and plan as noted above.  Changes made to the above note as needed.  Patient seen and independently examined with Doreene Adas, PA .   We  discussed all aspects of the encounter. I agree with the assessment and plan as stated above.  1. S/p CVA:   Scheduled for TEE tomorrow  2. ? Atrial fib:   Has numerous PACs with variable RR intervals. No clear evidence of afib as of yet Will place a 30 day monitor on her We can consider placing an implantable loop recorder although she has frequent multifocal PACs and has variable RR intervals.   It may be very difficult to differentiate this from atrial fib.     I have spent a total of 40 minutes with patient reviewing hospital  notes , telemetry, EKGs, labs and examining patient as well as establishing an assessment and plan that was discussed with the patient. > 50% of time was spent in direct patient care.    Thayer Headings, Brooke Bonito., MD, [facility] 12/03/2016, 9:47 AM [address]  [place] Pager [phone]

## 2016-12-04 ENCOUNTER — Inpatient Hospital Stay (HOSPITAL_COMMUNITY): Payer: Medicare Other | Admitting: Speech Pathology

## 2016-12-04 ENCOUNTER — Inpatient Hospital Stay (HOSPITAL_COMMUNITY): Payer: Medicare Other | Admitting: Occupational Therapy

## 2016-12-04 ENCOUNTER — Inpatient Hospital Stay (HOSPITAL_COMMUNITY): Payer: Medicare Other | Admitting: Physical Therapy

## 2016-12-04 DIAGNOSIS — R4701 Aphasia: Secondary | ICD-10-CM

## 2016-12-04 DIAGNOSIS — N183 Chronic kidney disease, stage 3 (moderate): Secondary | ICD-10-CM

## 2016-12-04 DIAGNOSIS — I69391 Dysphagia following cerebral infarction: Secondary | ICD-10-CM

## 2016-12-04 DIAGNOSIS — I10 Essential (primary) hypertension: Secondary | ICD-10-CM

## 2016-12-04 DIAGNOSIS — I69320 Aphasia following cerebral infarction: Secondary | ICD-10-CM

## 2016-12-04 DIAGNOSIS — D638 Anemia in other chronic diseases classified elsewhere: Secondary | ICD-10-CM

## 2016-12-04 LAB — CBC WITH DIFFERENTIAL/PLATELET
Basophils Absolute: 0 10*3/uL (ref 0.0–0.1)
Basophils Relative: 0 %
Eosinophils Absolute: 0.2 10*3/uL (ref 0.0–0.7)
Eosinophils Relative: 2 %
HEMATOCRIT: 30.1 % — AB (ref 36.0–46.0)
HEMOGLOBIN: 9.2 g/dL — AB (ref 12.0–15.0)
LYMPHS ABS: 1.6 10*3/uL (ref 0.7–4.0)
LYMPHS PCT: 18 %
MCH: 28.7 pg (ref 26.0–34.0)
MCHC: 30.6 g/dL (ref 30.0–36.0)
MCV: 93.8 fL (ref 78.0–100.0)
MONOS PCT: 6 %
Monocytes Absolute: 0.5 10*3/uL (ref 0.1–1.0)
NEUTROS ABS: 6.8 10*3/uL (ref 1.7–7.7)
NEUTROS PCT: 74 %
Platelets: 194 10*3/uL (ref 150–400)
RBC: 3.21 MIL/uL — ABNORMAL LOW (ref 3.87–5.11)
RDW: 15.4 % (ref 11.5–15.5)
WBC: 9.1 10*3/uL (ref 4.0–10.5)

## 2016-12-04 LAB — URINALYSIS, ROUTINE W REFLEX MICROSCOPIC
Bilirubin Urine: NEGATIVE
Glucose, UA: NEGATIVE mg/dL
Ketones, ur: NEGATIVE mg/dL
Nitrite: NEGATIVE
PH: 6 (ref 5.0–8.0)
Protein, ur: NEGATIVE mg/dL
SPECIFIC GRAVITY, URINE: 1.006 (ref 1.005–1.030)
SQUAMOUS EPITHELIAL / LPF: NONE SEEN

## 2016-12-04 LAB — COMPREHENSIVE METABOLIC PANEL
ALBUMIN: 3 g/dL — AB (ref 3.5–5.0)
ALK PHOS: 48 U/L (ref 38–126)
ALT: 18 U/L (ref 14–54)
ANION GAP: 8 (ref 5–15)
AST: 23 U/L (ref 15–41)
BUN: 20 mg/dL (ref 6–20)
CALCIUM: 8.4 mg/dL — AB (ref 8.9–10.3)
CO2: 22 mmol/L (ref 22–32)
CREATININE: 1.09 mg/dL — AB (ref 0.44–1.00)
Chloride: 115 mmol/L — ABNORMAL HIGH (ref 101–111)
GFR calc non Af Amer: 48 mL/min — ABNORMAL LOW (ref >60)
GFR, EST AFRICAN AMERICAN: 55 mL/min — AB (ref >60)
Glucose, Bld: 120 mg/dL — ABNORMAL HIGH (ref 65–99)
Potassium: 4.7 mmol/L (ref 3.5–5.1)
Sodium: 145 mmol/L (ref 135–145)
Total Bilirubin: 0.5 mg/dL (ref 0.3–1.2)
Total Protein: 5.8 g/dL — ABNORMAL LOW (ref 6.5–8.1)

## 2016-12-04 SURGERY — ECHOCARDIOGRAM, TRANSESOPHAGEAL
Anesthesia: Moderate Sedation

## 2016-12-04 MED ORDER — PRO-STAT SUGAR FREE PO LIQD
30.0000 mL | Freq: Two times a day (BID) | ORAL | Status: DC
  Administered 2016-12-04 – 2016-12-19 (×31): 30 mL via ORAL
  Filled 2016-12-04 (×32): qty 30

## 2016-12-04 MED ORDER — SULFAMETHOXAZOLE-TRIMETHOPRIM 400-80 MG PO TABS
1.0000 | ORAL_TABLET | Freq: Two times a day (BID) | ORAL | Status: DC
  Administered 2016-12-04 – 2016-12-07 (×6): 1 via ORAL
  Filled 2016-12-04 (×6): qty 1

## 2016-12-04 MED ORDER — SENNOSIDES-DOCUSATE SODIUM 8.6-50 MG PO TABS: 2.0000 | ORAL_TABLET | Freq: Every evening | ORAL | Status: DC | PRN

## 2016-12-04 MED ORDER — SODIUM CHLORIDE 0.9 % IV SOLN
INTRAVENOUS | Status: DC
  Administered 2016-12-04 – 2016-12-16 (×13): via INTRAVENOUS
  Filled 2016-12-04 (×21): qty 1000

## 2016-12-04 NOTE — Progress Notes (Signed)
[place] PHYSICAL MEDICINE & REHABILITATION     PROGRESS NOTE  Subjective/Complaints:  Pt seen laying in bed this AM.  Family at bedside. Patient slept well overnight.    ROS: Unable to assess due to aphasia.   Objective: Vital Signs: Blood pressure (!) 152/71, pulse 76, temperature 98.6 F (37 C), temperature source Oral, resp. rate 18, height 5\' 3"  (1.6 m), weight 79.8 kg (175 lb 14.4 oz), SpO2 99 %. No results found.  Recent Labs  12/03/16 0518 12/04/16 0537  WBC 9.7 9.1  HGB 9.1* 9.2*  HCT 29.4* 30.1*  PLT 199 194    Recent Labs  12/03/16 0518 12/04/16 0537  NA 145 145  K 3.8 4.7  CL 117* 115*  GLUCOSE 125* 120*  BUN 17 20  CREATININE 1.07* 1.09*  CALCIUM 8.1* 8.4*   CBG (last 3)  No results for input(s): GLUCAP in the last 72 hours.  Wt Readings from Last 3 Encounters:  12/03/16 79.8 kg (175 lb 14.4 oz)  11/29/16 79.5 kg (175 lb 4.3 oz)  07/31/16 74.8 kg (165 lb)    Physical Exam:  BP (!) 152/71 (BP Location: Right Arm)   Pulse 76   Temp 98.6 F (37 C) (Oral)   Resp 18   Ht 5\' 3"  (1.6 m)   Wt 79.8 kg (175 lb 14.4 oz)   SpO2 99%   BMI 31.16 kg/m  Constitutional: She appears well-developedand well-nourished.  HENT: Normocephalicand atraumatic.  Eyes: EOMI. No discharge.  Cardiovascular: Normal rateand regular rhythm. No JVD. Respiratory: Effort normaland breath sounds normal.  GI: Soft. Bowel sounds are normal.  Musculoskeletal: She no edema and no tenderness.  Neurological: She is alertand oriented.  Right facial weakness with dysarthria.  Expressive >receptive aphasia.  Motor RUE: 0/5 proximal to distal RLE: 2/5 proximal to distal LUE/LLE: 4/5 throughout Skin: Skin is warmand dry.  Psychiatric: She has a normal mood and affect. Her behavior is normal.    Assessment/Plan: 1. Functional deficits secondary to left MCA distribution infarct which require 3+ hours per day of interdisciplinary therapy in a comprehensive inpatient rehab  setting. Physiatrist is providing close team supervision and 24 hour management of active medical problems listed below. Physiatrist and rehab team continue to assess barriers to discharge/monitor patient progress toward functional and medical goals.  Function:  Bathing Bathing position      Bathing parts      Bathing assist        Upper Body Dressing/Undressing Upper body dressing                    Upper body assist        Lower Body Dressing/Undressing Lower body dressing                                  Lower body assist        Toileting Toileting Toileting activity did not occur: N/A        Toileting assist     Transfers Chair/bed transfer Chair/bed transfer activity did not occur: N/A           Manufacturing systems engineer          Cognition Comprehension Comprehension assist level: Understands basic 50 - 74% of the time/ requires cueing 25 - 49% of the time  Expression Expression assist level: Expresses basic 25 -  49% of the time/requires cueing 50 - 75% of the time. Uses single words/gestures.  Social Interaction Social Interaction assist level: Interacts appropriately 25 - 49% of time - Needs frequent redirection.  Problem Solving Problem solving assist level: Solves basic 25 - 49% of the time - needs direction more than half the time to initiate, plan or complete simple activities  Memory Memory assist level: Recognizes or recalls 25 - 49% of the time/requires cueing 50 - 75% of the time    Medical Problem List and Plan: 1. Right hemiparesis and aphasia  secondary to left MCA distribution infarct  Begin CIR  Notes reviewed, images reviewed  Will consider bracing 2. DVT Prophylaxis/Anticoagulation: Mechanical: Sequential compression devices, below knee Bilateral lower extremities 3. Chronic back pain/Pain Management: On pamelor for neuropathy and MS contin bid.  4. Mood: LCSW to follow for evaluation and  support.  5. Neuropsych: This patient is not capable of making decisions on her own behalf. 6. Skin/Wound Care: routine pressure relief measures.  7. Fluids/Electrolytes/Nutrition: May need IVF at nights if unable to maintain hydration.  8.HTN: Monitor BP bid. Continue avapro daily  Monitor with increased mobility 9. PAC's: Monitor heart rate bid. Currently in NSR.  10. Diastolic Heart failure: Asymptomatic.  Filed Weights   12/03/16 1630  Weight: 79.8 kg (175 lb 14.4 oz)   11.H/o anemia with multiple transfusion:  Hb 9.2 on 5/18  Labs ordered for Monday  Cont to monitor 12. Depression: has been managed on Prozac.  13. CKD: Baseline SCr- 1.4 range. Continue to monitor. Check lytes serially to monitor stability.   Cr 1.09 on 5/18  Labs ordered for Monday  Cont to monitor 14. Prediabetes: Hgb A1C- 5.8. Educate on appropriate diet as cognition improves.   Monitor with increased mobility 15. Dysphagia: On dysphagia 1, honey liquids by tsp 16. Hypoalbuminemia  Supplement initiated 5/18  LOS (Days) 1 A FACE TO FACE EVALUATION WAS PERFORMED  Valerie Mcclure 12/04/2016 9:49 AM

## 2016-12-04 NOTE — Progress Notes (Signed)
Patient reported to have urinary retention and was cath for > 1000 cc this am. Pyuria reported by nursing--will start bactrim empirically while awaiting UCS.

## 2016-12-04 NOTE — Evaluation (Signed)
Occupational Therapy Assessment and Plan  Patient Details  Name: Valerie Mcclure MRN: [id number] Date of Birth: 1940-10-01  OT Diagnosis: cognitive deficits, disturbance of vision, flaccid hemiplegia and hemiparesis, hemiplegia affecting dominant side, muscle weakness (generalized) and swelling of limb Rehab Potential: Rehab Potential (ACUTE ONLY): Good ELOS: 21-24 days   Today's Date: 12/04/2016 OT Individual Time: 1100-1200 OT Individual Time Calculation (min): 60 min     Problem List:  Patient Active Problem List   Diagnosis Date Noted  . Anemia of chronic disease   . Benign essential HTN   . Dysphagia, post-stroke   . Expressive aphasia   . Anterior cerebral circulation hemorrhagic infarction ([place]) 12/03/2016  . PAC (premature atrial contraction) 12/03/2016  . Acute ischemic left MCA stroke ([place]) 12/03/2016  . Aphasia as late effect of stroke 12/03/2016  . Right hemiparesis ([place])   . Nontraumatic subcortical hemorrhage of left cerebral hemisphere ([place])   . Acute embolic stroke ([place]) 40/98/1191  . Mild aortic regurgitation 08/20/2016  . Bilateral edema of lower extremity 04/07/2016  . Pancreatic duct dilated 01/09/2016  . CKD (chronic kidney disease) 12/25/2015  . Mild tricuspid regurgitation 12/25/2015  . Mild mitral regurgitation 12/25/2015  . Prediabetes 06/10/2015  . Edema 06/04/2015  . Abdominal pain, chronic, right upper quadrant 06/04/2015  . Postmenopausal disorder 04/03/2014  . Chronic low back pain   . PAIN IN THORACIC SPINE 04/18/2010  . Coronary atherosclerosis 01/24/2009  . DEGENERATIVE JOINT DISEASE 01/23/2009  . ARTHRITIS, LEFT KNEE 12/13/2008  . HERNIATED LUMBAR DISC 12/13/2008  . Hypothyroidism 11/21/2007  . Hyperlipidemia 11/09/2007  . ANEMIA 11/09/2007  . Essential hypertension 11/09/2007  . Osteopenia 11/09/2007    Past Medical History:  Past Medical History:  Diagnosis Date  . Blood transfusion without reported diagnosis   . Chronic low  back pain   . Hyperlipidemia   . Osteopenia   . Unspecified essential hypertension   . Unspecified hypothyroidism    Past Surgical History:  Past Surgical History:  Procedure Laterality Date  . ABDOMINAL HYSTERECTOMY  1970  . CHOLECYSTECTOMY  07/2009   Dr. Rise Patience  . COLONOSCOPY  2003  . FLEXIBLE SIGMOIDOSCOPY  2010  . HAND SURGERY    . IR ANGIO INTRA EXTRACRAN SEL COM CAROTID INNOMINATE UNI L MOD SED  11/29/2016  . IR ANGIO VERTEBRAL SEL SUBCLAVIAN INNOMINATE UNI R MOD SED  11/29/2016  . IR PERCUTANEOUS ART THROMBECTOMY/INFUSION INTRACRANIAL INC DIAG ANGIO  11/29/2016  . LUMBAR LAMINECTOMY  11/2008   Done by Dr. Patrice Paradise  . RADIOLOGY WITH ANESTHESIA N/A 11/29/2016   Procedure: RADIOLOGY WITH ANESTHESIA;  Surgeon: Radiologist, Medication, MD;  Location: [facility];  Service: Radiology;  Laterality: N/A;  . THYROIDECTOMY      Assessment & Plan Clinical Impression: Patient is a 76 y.o. year old female with history of chronic abdominal and LBP, CKD, hyperlipidemia who fell out of bed on 5/13 am and was found to have left facial weakness with slurred speech and right sided weakness. CT head done revealing hyperdense L-MCA sign with hypodensity in left temporal area. CTA showed left M1 occlusion due to acute thrombus or embolus with significant penumbra left temporoparietal lobe and irregular non-calcified plaque L-ICA. She underwent cerebral angio with complete revascularization of L-MCA with reperfusion. Follow up CCT showed hemorrhagic transformation of left temporal infarct, large area of hemorrhage and extravasated contrast in left basal ganglia, minimal SDH along tentorium and 6 mm midline shift to right. MRI brain L-MCA infarct with diffusion restriction left temporal lobe,  superimposed 5.5 cm intra-axial hemorrhage centered at left basal ganglia with associated left hemisphere edema, effaced left lateral ventricle with trace IVH and occasional small foci of restriction in left thalamus and  anterior left occipital lobe.   She was extubated 5/14 pm and Dr. Acie Fredrickson consulted for input on new onset A fib on telemetry. EKG without evidence of A fib. 2 D echo showed EF 55-60% with mild mitral regurg. He felt that patient with multifocal PACs and might be having bouts of A fib due to stress v/s sinus arrhthymias. Question event monitor v/s loop to evaluate for arrhthymias--to have 30 day monitor at discharge and loop recorder if monitor unrevealing. BLE ultrasound without DVT--right groin limited due to bandage. FEES done showing silent penetration with liquids--D1, honey by teaspoon recommended due to dysphagia. Dr. Erlinda Hong felt that stroke embolic due to unknown source and to start ASA on 5/24 for secondary stroke prevention. Speech evaluation revealed moderate dysarthria with expressive >receptive aphasia with phonemic paraphasias and anomia. Patient with resultant right sided weakness with right lean, right inattention, motor apraxia and DOE.   Patient transferred to CIR on 12/03/2016 .    Patient currently requires max with basic self-care skills secondary to muscle weakness, decreased cardiorespiratoy endurance, impaired timing and sequencing, abnormal tone and unbalanced muscle activation, decreased attention to right, decreased attention, decreased awareness, decreased problem solving, decreased safety awareness and decreased memory and decreased sitting balance, decreased standing balance, decreased postural control, hemiplegia and decreased balance strategies.  Prior to hospitalization, patient could complete ADLs with independent .  Patient will benefit from skilled intervention to decrease level of assist with basic self-care skills prior to discharge home with care partner.  Anticipate patient will require 24 hour supervision and minimal physical assistance and follow up home health.  OT - End of Session Activity Tolerance: Tolerates 30+ min activity with multiple rests Endurance  Deficit: Yes Endurance Deficit Description: required multiple rest breaks OT Assessment Rehab Potential (ACUTE ONLY): Good OT Patient demonstrates impairments in the following area(s): Balance;Cognition;Edema;Endurance;Motor;Perception;Safety;Sensory;Skin Integrity;Vision OT Basic ADL's Functional Problem(s): Eating;Grooming;Bathing;Dressing;Toileting OT Transfers Functional Problem(s): Toilet;Tub/Shower OT Additional Impairment(s): Fuctional Use of Upper Extremity OT Plan OT Intensity: Minimum of 1-2 x/day, 45 to 90 minutes OT Frequency: 5 out of 7 days OT Duration/Estimated Length of Stay: 21-24 days OT Treatment/Interventions: Balance/vestibular training;Cognitive remediation/compensation;Community reintegration;Discharge planning;Disease mangement/prevention;DME/adaptive equipment instruction;Functional electrical stimulation;Functional mobility training;Neuromuscular re-education;Pain management;Patient/family education;Psychosocial support;Self Care/advanced ADL retraining;Skin care/wound managment;Splinting/orthotics;Therapeutic Activities;Therapeutic Exercise;UE/LE Strength taining/ROM;UE/LE Coordination activities;Visual/perceptual remediation/compensation;Wheelchair propulsion/positioning OT Self Feeding Anticipated Outcome(s): Supervision/setup OT Basic Self-Care Anticipated Outcome(s): Min assist - Supervision OT Toileting Anticipated Outcome(s): Min assist OT Bathroom Transfers Anticipated Outcome(s): Min assist OT Recommendation Recommendations for Other Services: Therapeutic Recreation consult Therapeutic Recreation Interventions: Pet therapy;Stress management Patient destination: Home Follow Up Recommendations: Home health OT;24 hour supervision/assistance Equipment Recommended: 3 in 1 bedside comode;Tub/shower bench   Skilled Therapeutic Intervention OT eval completed with discussion of rehab process, OT purpose, POC, ELOS, and goals.  ADL assessment completed with bathing  at EOB.  Pt with decreased awareness of RUE during mobility, however with intermittent attempts to incorporate RUE into bathing and dressing tasks with approaching synergistic pattern.  Max assist sit > stand due to flexor withdrawal in RLE, therapist providing tactile cues/manual facilitation to RLE and +2 for LB dressing.  Completed squat pivot transfer to [facility] with increased time and 2nd person present for safety.  Michaelyn Barter for return to bed secondary to fatigue and plan for use of nursing staff.  Pt  with improved sit > stand with UE support on Stedy bar with facilitation for weight shift and positioning of RUE.  Pt incontinent of bladder upon return to bed, requiring mod-max assist for bed mobility to change brief and don new pants.  Provided pt and family members (husband and daughter) with education and handout regarding improved positioning in bed and when upright as well as education on massage to RUE to minimize edema.    OT Evaluation Precautions/Restrictions  Precautions Precautions: Fall Restrictions Weight Bearing Restrictions: No Pain Pain Assessment Pain Assessment: No/denies pain Home Living/Prior Functioning Home Living Family/patient expects to be discharged to:: Private residence Living Arrangements: Spouse/significant other, Children Available Help at Discharge: Family, Available 24 hours/day Type of Home: Apartment Home Access: Level entry Home Layout: One level Bathroom Shower/Tub: Tub/shower unit  Lives With: Spouse, Daughter IADL History Homemaking Responsibilities: Yes Meal Prep Responsibility: Therapist, occupational Responsibility: Primary Cleaning Responsibility: Primary Prior Function Level of Independence: Independent with basic ADLs, Requires assistive device for independence, Independent with transfers, Independent with homemaking with ambulation  Able to Take Stairs?: Yes Driving: Yes Comments: occasionally used SPC due to back pain, otherwise  independent ADL  See Function Navigator Vision Baseline Vision/History: Wears glasses Wears Glasses: Reading only Patient Visual Report: No change from baseline Vision Assessment?: Vision impaired- to be further tested in functional context Additional Comments: Unable to complete visual assessment due to fatigue and incontinent episode.  Noted pt to have Rt inattention.  Need to further assess during functional context Perception  Perception: Impaired Inattention/Neglect: Does not attend to right visual field;Does not attend to right side of body Cognition Overall Cognitive Status: Impaired/Different from baseline Arousal/Alertness: Awake/alert Orientation Level: Person Year:  (19.Marland KitchenMarland Kitchen?) Month:  ("July" when given a choice of 4) Day of Week: Incorrect Memory: Impaired Immediate Memory Recall: Sock;Blue;Bed (after 3 repetitions) Memory Recall: Sock;Blue Memory Recall Sock: Without Cue Memory Recall Blue: With Cue Attention: Selective Sustained Attention: Appears intact Selective Attention: Appears intact Awareness: Impaired Awareness Impairment: Emergent impairment Problem Solving: Impaired Problem Solving Impairment: Functional basic Safety/Judgment: Impaired Sensation Sensation Light Touch: Impaired Detail Light Touch Impaired Details: Impaired RLE;Impaired RUE Hot/Cold: Impaired by gross assessment (RUE) Proprioception: Impaired Detail Proprioception Impaired Details: Impaired RLE;Impaired RUE Coordination Gross Motor Movements are Fluid and Coordinated: No Fine Motor Movements are Fluid and Coordinated: No Coordination and Movement Description: Rt hemiplegia Motor  Motor Motor: Abnormal postural alignment and control;Hemiplegia Motor - Skilled Clinical Observations: Rt hemiplegia  Trunk/Postural Assessment  Cervical Assessment Cervical Assessment:  (slight Lt cervical rotation, flexible) Thoracic Assessment Thoracic Assessment:  (mild kyphosis) Lumbar  Assessment Lumbar Assessment:  (posterior pelvic tilt) Postural Control Postural Control: Deficits on evaluation Righting Reactions: delayed  Balance Dynamic Sitting Balance Sitting balance - Comments: min A for sitting EOB for functional tasks Dynamic Standing Balance Dynamic Standing - Comments: max A for pre gait stepping Extremity/Trunk Assessment RUE Assessment RUE Assessment: Exceptions to [facility] (shoulder flexion grossly 20 degrees with horizontal adduction during functional tasks.  Approaching [place] level 2) LUE Assessment LUE Assessment: Within Functional Limits   See Function Navigator for Current Functional Status.   Refer to Care Plan for Long Term Goals  Recommendations for other services: Therapeutic Recreation  Pet therapy   Discharge Criteria: Patient will be discharged from OT if patient refuses treatment 3 consecutive times without medical reason, if treatment goals not met, if there is a change in medical status, if patient makes no progress towards goals or if patient is discharged from hospital.  The above assessment, treatment plan, treatment alternatives and goals were discussed and mutually agreed upon: by patient and by family  [facility] 12/04/2016, 3:51 PM

## 2016-12-04 NOTE — Evaluation (Signed)
Physical Therapy Assessment and Plan  Patient Details  Name: Valerie Mcclure MRN: [id number] Date of Birth: [birth year]  PT Diagnosis: Abnormality of gait, Cognitive deficits, Difficulty walking, Hemiplegia dominant and Muscle weakness Rehab Potential: Good ELOS: 21-24 days   Today's Date: 12/04/2016 PT Individual Time: 1330-1440 PT Individual Time Calculation (min): 70 min    Problem List:  Patient Active Problem List   Diagnosis Date Noted  . Anemia of chronic disease   . Benign essential HTN   . Dysphagia, post-stroke   . Expressive aphasia   . Anterior cerebral circulation hemorrhagic infarction ([place]) 12/03/2016  . PAC (premature atrial contraction) 12/03/2016  . Acute ischemic left MCA stroke ([place]) 12/03/2016  . Aphasia as late effect of stroke 12/03/2016  . Right hemiparesis ([place])   . Nontraumatic subcortical hemorrhage of left cerebral hemisphere ([place])   . Acute embolic stroke ([place]) 53/61/4431  . Mild aortic regurgitation 08/20/2016  . Bilateral edema of lower extremity 04/07/2016  . Pancreatic duct dilated 01/09/2016  . CKD (chronic kidney disease) 12/25/2015  . Mild tricuspid regurgitation 12/25/2015  . Mild mitral regurgitation 12/25/2015  . Prediabetes 06/10/2015  . Edema 06/04/2015  . Abdominal pain, chronic, right upper quadrant 06/04/2015  . Postmenopausal disorder 04/03/2014  . Chronic low back pain   . PAIN IN THORACIC SPINE 04/18/2010  . Coronary atherosclerosis 01/24/2009  . DEGENERATIVE JOINT DISEASE 01/23/2009  . ARTHRITIS, LEFT KNEE 12/13/2008  . HERNIATED LUMBAR DISC 12/13/2008  . Hypothyroidism 11/21/2007  . Hyperlipidemia 11/09/2007  . ANEMIA 11/09/2007  . Essential hypertension 11/09/2007  . Osteopenia 11/09/2007    Past Medical History:  Past Medical History:  Diagnosis Date  . Blood transfusion without reported diagnosis   . Chronic low back pain   . Hyperlipidemia   . Osteopenia   . Unspecified essential hypertension   .  Unspecified hypothyroidism    Past Surgical History:  Past Surgical History:  Procedure Laterality Date  . ABDOMINAL HYSTERECTOMY  1970  . CHOLECYSTECTOMY  07/2009   Dr. Rise Patience  . COLONOSCOPY  2003  . FLEXIBLE SIGMOIDOSCOPY  2010  . HAND SURGERY    . IR ANGIO INTRA EXTRACRAN SEL COM CAROTID INNOMINATE UNI L MOD SED  11/29/2016  . IR ANGIO VERTEBRAL SEL SUBCLAVIAN INNOMINATE UNI R MOD SED  11/29/2016  . IR PERCUTANEOUS ART THROMBECTOMY/INFUSION INTRACRANIAL INC DIAG ANGIO  11/29/2016  . LUMBAR LAMINECTOMY  11/2008   Done by Dr. Patrice Paradise  . RADIOLOGY WITH ANESTHESIA N/A 11/29/2016   Procedure: RADIOLOGY WITH ANESTHESIA;  Surgeon: Radiologist, Medication, MD;  Location: [facility];  Service: Radiology;  Laterality: N/A;  . THYROIDECTOMY      Assessment & Plan Clinical Impression: Patient is a 76 y.o. year old female fell out of bed on 5/13 am and was found to have left facial weakness with slurred speech and right sided weakness. CT head done revealing hyperdense L-MCA sign with hypodensity in left temporal area. CTA showed left M1 occlusion due to acute thrombus or embolus with significant penumbra left temporoparietal lobe and irregular non-calcified plaque L-ICA. She underwent cerebral angio with complete revascularization of L-MCA with reperfusion. Follow up CCT showed hemorrhagic transformation of left temporal infarct, large area of hemorrhage and extravasated contrast in left basal ganglia, minimal SDH along tentorium and 6 mm midline shift to right. MRI brain L-MCA infarct with diffusion restriction left temporal lobe, superimposed 5.5 cm intra-axial hemorrhage centered at left basal ganglia with associated left hemisphere edema, effaced left lateral ventricle with trace IVH and  occasional small foci of restriction in left thalamus and anterior left occipital lobe. Patient transferred to CIR on 12/03/2016 .   Patient currently requires max with mobility secondary to muscle weakness, abnormal tone  and decreased coordination, decreased awareness, decreased problem solving and decreased safety awareness and decreased sitting balance, decreased standing balance, decreased postural control, hemiplegia and decreased balance strategies.  Prior to hospitalization, patient was independent  with mobility and lived with Spouse, Daughter in a [place].  Home access is  Level entry.  Patient will benefit from skilled PT intervention to maximize safe functional mobility, minimize fall risk and decrease caregiver burden for planned discharge home with 24 hour supervision.  Anticipate patient will benefit from follow up [place] at discharge.  PT - End of Session Activity Tolerance: Tolerates 30+ min activity with multiple rests Endurance Deficit: Yes PT Assessment Rehab Potential (ACUTE/IP ONLY): Good PT Patient demonstrates impairments in the following area(s): Balance;Endurance;Motor;Sensory;Safety PT Transfers Functional Problem(s): Bed Mobility;Bed to Chair;Car;Furniture PT Locomotion Functional Problem(s): Ambulation;Wheelchair Mobility;Stairs PT Plan PT Intensity: Minimum of 1-2 x/day ,45 to 90 minutes PT Frequency: 5 out of 7 days PT Duration Estimated Length of Stay: 21-24 days PT Treatment/Interventions: Ambulation/gait training;Cognitive remediation/compensation;DME/adaptive equipment instruction;Discharge planning;Functional mobility training;Pain management;Splinting/orthotics;Therapeutic Activities;UE/LE Strength taining/ROM;Wheelchair propulsion/positioning;Therapeutic Exercise;UE/LE Coordination activities;Stair training;Patient/family education;Neuromuscular re-education;Functional electrical stimulation;Community reintegration;Balance/vestibular training PT Transfers Anticipated Outcome(s): supervision PT Locomotion Anticipated Outcome(s): min A gait PT Recommendation Recommendations for Other Services: Therapeutic Recreation consult Follow Up Recommendations: Home health PT;Outpatient  PT Patient destination: Home Equipment Recommended: To be determined  Skilled Therapeutic Intervention Pt participated in skilled PT eval. Pt and daughter were educated on PT POC and goals, both verbalize agreement. Pt performed bed <> w/c and w/c <> mat squat pivot transfers with max A, pt requiring assist for lift and lower and assisting with 40% of transfer.  Standing balance and wt shifts with pt's Rt UE over PT's shoulder with max A, manual facilitation to activate Rt hip mm.  Gait 10' and 20' with pt's Rt UE over PT's shoulder, max A for wt shifts. Gait limited by pt's Rt LE becoming fatigued and she is unable to assist with advancing it.  Stair negotiation with Lt handrail and max A for ascending/descending 2 3'' steps.  Simulated sedan transfer with max A, cues for scooting into seat. Nu step x 6 minutes for LE strengthening with pt requiring cues for initiation but then able to perform without rests.  Pt left in room with quick release belt donned, Rt arm tray, and daughter present.  PT Evaluation Precautions/Restrictions Precautions Precautions: Fall Restrictions Weight Bearing Restrictions: No Pain Pain Assessment Pain Assessment: No/denies pain Home Living/Prior Functioning Home Living Available Help at Discharge: Family Type of Home: Apartment Home Access: Level entry Home Layout: One level  Lives With: Spouse;Daughter Prior Function Level of Independence: Independent with basic ADLs;Requires assistive device for independence;Independent with transfers  Able to Take Stairs?: Yes Comments: occasionally used SPC due to back pain, otherwise independent  Cognition Overall Cognitive Status: Impaired/Different from baseline Arousal/Alertness: Awake/alert Orientation Level: Oriented to person;Oriented to place;Oriented to situation Attention: Selective Sustained Attention: Appears intact Selective Attention: Appears intact Awareness: Impaired Awareness Impairment: Emergent  impairment Problem Solving: Impaired Problem Solving Impairment: Functional basic Safety/Judgment: Impaired Sensation Sensation Light Touch: Impaired Detail Light Touch Impaired Details: Impaired RLE;Impaired RUE Proprioception: Impaired Detail Proprioception Impaired Details: Impaired RLE;Impaired RUE Coordination Gross Motor Movements are Fluid and Coordinated: No Fine Motor Movements are Fluid and Coordinated: No Coordination and Movement Description:  Rt hemiplegia Motor  Motor Motor: Abnormal postural alignment and control;Hemiplegia Motor - Skilled Clinical Observations: Rt hemiplegia   Trunk/Postural Assessment  Cervical Assessment Cervical Assessment:  (slight Lt cervical rotation, flexible) Thoracic Assessment Thoracic Assessment:  (mild kyphosis) Lumbar Assessment Lumbar Assessment:  (posterior pelvic tilt) Postural Control Postural Control: Deficits on evaluation Righting Reactions: delayed  Balance Dynamic Sitting Balance Sitting balance - Comments: min A for sitting EOB for functional tasks Dynamic Standing Balance Dynamic Standing - Comments: max A for pre gait stepping Extremity Assessment      RLE Assessment RLE Assessment:  (hip 2+/5, knee 3-/5, ankle 3-/5) LLE Assessment LLE Assessment:  (grossly 3+/5)   See Function Navigator for Current Functional Status.   Refer to Care Plan for Long Term Goals  Recommendations for other services: Therapeutic Recreation  Pet therapy and Kitchen group  Discharge Criteria: Patient will be discharged from PT if patient refuses treatment 3 consecutive times without medical reason, if treatment goals not met, if there is a change in medical status, if patient makes no progress towards goals or if patient is discharged from hospital.  The above assessment, treatment plan, treatment alternatives and goals were discussed and mutually agreed upon: by patient and by family  DONAWERTH,KAREN 12/04/2016, 2:47 PM

## 2016-12-04 NOTE — Evaluation (Addendum)
Speech Language Pathology Assessment and Plan  Patient Details  Name: Valerie Mcclure MRN: [id number] Date of Birth: 06-09-1941  SLP Diagnosis: Aphasia;Dysarthria;Dysphagia;Apraxia;Cognitive Impairments  Rehab Potential: Good ELOS:   21-24 days    Today's Date: 12/04/2016 SLP Individual Time: 1000-1100 SLP Individual Time Calculation (min): 60 min   Problem List:  Patient Active Problem List   Diagnosis Date Noted  . Anemia of chronic disease   . Benign essential HTN   . Dysphagia, post-stroke   . Expressive aphasia   . Anterior cerebral circulation hemorrhagic infarction ([place]) 12/03/2016  . PAC (premature atrial contraction) 12/03/2016  . Acute ischemic left MCA stroke ([place]) 12/03/2016  . Aphasia as late effect of stroke 12/03/2016  . Right hemiparesis ([place])   . Nontraumatic subcortical hemorrhage of left cerebral hemisphere ([place])   . Acute embolic stroke ([place]) 34/74/2595  . Mild aortic regurgitation 08/20/2016  . Bilateral edema of lower extremity 04/07/2016  . Pancreatic duct dilated 01/09/2016  . CKD (chronic kidney disease) 12/25/2015  . Mild tricuspid regurgitation 12/25/2015  . Mild mitral regurgitation 12/25/2015  . Prediabetes 06/10/2015  . Edema 06/04/2015  . Abdominal pain, chronic, right upper quadrant 06/04/2015  . Postmenopausal disorder 04/03/2014  . Chronic low back pain   . PAIN IN THORACIC SPINE 04/18/2010  . Coronary atherosclerosis 01/24/2009  . DEGENERATIVE JOINT DISEASE 01/23/2009  . ARTHRITIS, LEFT KNEE 12/13/2008  . HERNIATED LUMBAR DISC 12/13/2008  . Hypothyroidism 11/21/2007  . Hyperlipidemia 11/09/2007  . ANEMIA 11/09/2007  . Essential hypertension 11/09/2007  . Osteopenia 11/09/2007   Past Medical History:  Past Medical History:  Diagnosis Date  . Blood transfusion without reported diagnosis   . Chronic low back pain   . Hyperlipidemia   . Osteopenia   . Unspecified essential hypertension   . Unspecified hypothyroidism     Past Surgical History:  Past Surgical History:  Procedure Laterality Date  . ABDOMINAL HYSTERECTOMY  1970  . CHOLECYSTECTOMY  07/2009   Dr. Rise Patience  . COLONOSCOPY  2003  . FLEXIBLE SIGMOIDOSCOPY  2010  . HAND SURGERY    . IR ANGIO INTRA EXTRACRAN SEL COM CAROTID INNOMINATE UNI L MOD SED  11/29/2016  . IR ANGIO VERTEBRAL SEL SUBCLAVIAN INNOMINATE UNI R MOD SED  11/29/2016  . IR PERCUTANEOUS ART THROMBECTOMY/INFUSION INTRACRANIAL INC DIAG ANGIO  11/29/2016  . LUMBAR LAMINECTOMY  11/2008   Done by Dr. Patrice Paradise  . RADIOLOGY WITH ANESTHESIA N/A 11/29/2016   Procedure: RADIOLOGY WITH ANESTHESIA;  Surgeon: Radiologist, Medication, MD;  Location: [facility];  Service: Radiology;  Laterality: N/A;  . THYROIDECTOMY      Assessment / Plan / Recommendation Clinical Impression   Valerie Mckeever [name] a 76 y.o.femalewith history of chronic abdominal and LBP, CKD, hyperlipidemia who fell out of bed on 5/13 am and was found to have left facial weakness with slurred speech and right sided weakness. CT head done revealing hyperdense L-MCA sign with hypodensity in left temporal area. CTA showed left M1 occlusion due to acute thrombus or embolus with significant penumbra left temporoparietal lobe and irregular non-calcified plaque L-ICA. She underwent cerebral angio with complete revascularization of L-MCA with reperfusion. Follow up CCT showed hemorrhagic transformation of left temporal infarct, large area of hemorrhage and extravasated contrast in left basal ganglia, minimal SDH along tentorium and 6 mm midline shift to right. MRI brain L-MCA infarct with diffusion restriction left temporal lobe, superimposed 5.5 cm intra-axial hemorrhage centered at left basal ganglia with associated left hemisphere edema, effaced left lateral ventricle  with trace IVH and occasional small foci of restriction in left thalamus and anterior left occipital lobe. She was extubated 5/14 pm and Dr. Acie Fredrickson consulted for input on new onset  A fib on telemetry. EKG without evidence of A fib. 2 D echo showed EF 55-60% with mild mitral regurg. He felt that patient with multifocal PACs and might be having bouts of A fib due to stress v/s sinus arrhthymias. FEES done showing silent penetration with liquids--D1, honey by teaspoon recommended due to dysphagia. Dr. Erlinda Hong felt that stroke embolic due to unknown source and to start ASA on 5/24 for secondary stroke prevention. Speech evaluation revealed moderate dysarthria with expressive >receptive aphasia with phonemic paraphasias and anomia. Patient with resultant right sided weakness with right lean, right inattention, motor apraxia and DOE. CIR recommended for follow up therapy.  Pt admitted to CIR on 12/03/2016.  SLP evaluation completed on 12/04/2016 with the following results:  Pt presents with a moderate expressive>receptive aphasia.  Output is characterized by jargon, echolalia, perseveration, and paraphasic errors. Pt can follow 1-step commands and answer simple yes/no questions but demonstrates receptive impairments for more complex information and commands.  Pt also presents with a motor planning impairment characterized by groping for words of which pt is aware but needs assist to correct.  Furthermore, pt demonstrates a moderate dysarthria resulting from right sided oral motor weakness which impacts her intelligibility at the phrase level.  Pt demonstrates moderate cognitive impairment characterized by decreased safety awareness/impulsivity and emergent awareness deficits.     Additionally pt presents with a moderate oropharyngeal dysphagia characterized by decreased containment and transit of boluses orally and suspected delayed swallow initiation which resulted in anterior labial spillage of purees and delayed coughing with teaspoons of honey thick liquids when consumed at a fast rate.  Pt will need repeat objective assessment prior to advancement.  Pt would benefit from skilled ST while  inpatient in order to maximize functional independence and reduce burden of care prior to discharge.  Anticipate that pt will need 24/7 supervision at discharge in addition to [place] follow up at next level of care.     Skilled Therapeutic Interventions          Cognitive-linguistic and bedside swallow evaluation completed with results and recommendations reviewed with family.  Pt needed mod assist verbal cues for rate and portion control when consuming presentations of her currently prescribed diet.  Pt's daughter and husband were present during evaluation and were provided with skilled education regarding communication strategies for individuals with aphasia, emphasizing use of short, simple phrases, reinforcing communication attempts, and asking targeted yes/no questions to determine pt's needs/wants.  All questions were answered to their satisfaction at this time.      SLP Assessment  Patient will need skilled Speech Lanaguage Pathology Services during CIR admission    Recommendations  SLP Diet Recommendations: Dysphagia 1 (Puree);Honey Liquid Administration via: Spoon Medication Administration: Crushed with puree Supervision: Patient able to self feed;Full supervision/cueing for compensatory strategies Compensations: Slow rate;Small sips/bites;Monitor for anterior loss;Multiple dry swallows after each bite/sip;Follow solids with liquid Postural Changes and/or Swallow Maneuvers: Seated upright 90 degrees;Upright 30-60 min after meal Oral Care Recommendations: Oral care BID Patient destination: Home Follow up Recommendations: Home Health SLP;Outpatient SLP;Skilled Nursing facility;24 hour supervision/assistance Equipment Recommended: To be determined    SLP Frequency 3 to 5 out of 7 days   SLP Duration  SLP Intensity  SLP Treatment/Interventions    Minumum of 1-2 x/day, 30 to 90 minutes  Cognitive  remediation/compensation;Cueing hierarchy;Dysphagia/aspiration precaution  training;Environmental controls;Functional tasks;Internal/external aids;Patient/family education;Speech/Language facilitation    Pain Pain Assessment Pain Assessment: No/denies pain Pain Score: 0-No pain  Prior Functioning Cognitive/Linguistic Baseline: Within functional limits Type of Home: House  Lives With: Spouse;Daughter Available Help at Discharge: Family  Function:  Eating Eating   Modified Consistency Diet: Yes Eating Assist Level: Helper scoops food on utensil;Supervision or verbal cues;Set up assist for   Eating Set Up Assist For: Opening containers       Cognition Comprehension Comprehension assist level: Understands basic 50 - 74% of the time/ requires cueing 25 - 49% of the time  Expression   Expression assist level: Expresses basic 25 - 49% of the time/requires cueing 50 - 75% of the time. Uses single words/gestures.  Social Interaction Social Interaction assist level: Interacts appropriately 25 - 49% of time - Needs frequent redirection.  Problem Solving Problem solving assist level: Solves basic 25 - 49% of the time - needs direction more than half the time to initiate, plan or complete simple activities  Memory Memory assist level: Recognizes or recalls 25 - 49% of the time/requires cueing 50 - 75% of the time   Short Term Goals: Week 1: SLP Short Term Goal 1 (Week 1): Pt will consume therapeutic trials of ice chips with minimal overt s/s of aspiration and min verbal cues for use of swallowing precautions prior to repeat objective assessment  SLP Short Term Goal 2 (Week 1): Pt will consume dys 1 textures and honey thick liquids via teaspoon with min assist verbal cues for use of swallowing precautions.   SLP Short Term Goal 3 (Week 1): Pt will name basic, familiar items with mod assist verbal cues for >75% accuracy.  SLP Short Term Goal 4 (Week 1): Pt will follow 2 step commands during functional tasks with min assist verbal and visual cues.   SLP Short Term Goal  5 (Week 1): Pt will recognize and correct verbal errors during structured tasks with mod assist verbal cues.    Refer to Care Plan for Long Term Goals  Recommendations for other services: None   Discharge Criteria: Patient will be discharged from SLP if patient refuses treatment 3 consecutive times without medical reason, if treatment goals not met, if there is a change in medical status, if patient makes no progress towards goals or if patient is discharged from hospital.  The above assessment, treatment plan, treatment alternatives and goals were discussed and mutually agreed upon: by patient and by family  Jeno Calleros, Selinda Orion 12/04/2016, 12:40 PM

## 2016-12-04 NOTE — IPOC Note (Signed)
Overall Plan of Care [facility]) Patient Details Name: Valerie Mcclure MRN: [id number] DOB: 1941-04-27  Admitting Diagnosis: L MCA Infarct  Hospital Problems: Principal Problem:   Right hemiparesis ([place]) Active Problems:   CKD (chronic kidney disease)   Acute ischemic left MCA stroke ([place])   Aphasia as late effect of stroke   Anemia of chronic disease   Benign essential HTN   Dysphagia, post-stroke   Expressive aphasia     Functional Problem List: Nursing Bladder, Bowel, Edema, Endurance, Medication Management, Pain, Nutrition, Safety, Skin Integrity  PT Balance, Endurance, Motor, Sensory, Safety  OT Balance, Cognition, Edema, Endurance, Motor, Perception, Safety, Sensory, Skin Integrity, Vision  SLP Linguistic, Nutrition, Cognition  TR         Basic ADL's: OT Eating, Grooming, Bathing, Dressing, Toileting     Advanced  ADL's: OT       Transfers: PT Bed Mobility, Bed to Chair, Car, Manufacturing systems engineer, Metallurgist: PT Ambulation, Emergency planning/management officer, Stairs     Additional Impairments: OT Fuctional Use of Upper Extremity  SLP Swallowing, Communication comprehension, expression    TR      Anticipated Outcomes Item Anticipated Outcome  Self Feeding Supervision/setup  Swallowing  Supervision    Basic self-care  Min assist - Supervision  Toileting  Min assist   Bathroom Transfers Min assist  Bowel/Bladder  Min assist  Transfers  supervision  Locomotion  min A gait  Communication  Min assist   Cognition  Min assist   Pain  <2 on a 0-10 pain scale  Safety/Judgment  Mod assist    Therapy Plan: PT Intensity: Minimum of 1-2 x/day ,45 to 90 minutes PT Frequency: 5 out of 7 days PT Duration Estimated Length of Stay: 21-24 days OT Intensity: Minimum of 1-2 x/day, 45 to 90 minutes OT Frequency: 5 out of 7 days OT Duration/Estimated Length of Stay: 21-24 days SLP Intensity: Minumum of 1-2 x/day, 30 to 90 minutes SLP Frequency: 3 to 5  out of 7 days SLP Duration/Estimated Length of Stay: 21-24 days        Team Interventions: Nursing Interventions Patient/Family Education, Bladder Management, Bowel Management, Disease Management/Prevention, Pain Management, Medication Management, Dysphagia/Aspiration Precaution Training, Discharge Planning, Psychosocial Support  PT interventions Ambulation/gait training, Cognitive remediation/compensation, DME/adaptive equipment instruction, Discharge planning, Functional mobility training, Pain management, Splinting/orthotics, Therapeutic Activities, UE/LE Strength taining/ROM, Wheelchair propulsion/positioning, Therapeutic Exercise, UE/LE Coordination activities, Stair training, Patient/family education, Neuromuscular re-education, Functional electrical stimulation, Community reintegration, Training and development officer  OT Interventions Training and development officer, Cognitive remediation/compensation, Academic librarian, Discharge planning, Disease mangement/prevention, Engineer, drilling, Functional electrical stimulation, Functional mobility training, Neuromuscular re-education, Pain management, Patient/family education, Psychosocial support, Self Care/advanced ADL retraining, Skin care/wound managment, Splinting/orthotics, Therapeutic Activities, Therapeutic Exercise, UE/LE Strength taining/ROM, UE/LE Coordination activities, Visual/perceptual remediation/compensation, Wheelchair propulsion/positioning  SLP Interventions Cognitive remediation/compensation, English as a second language teacher, Dysphagia/aspiration precaution training, Environmental controls, Functional tasks, Internal/external aids, Patient/family education, Speech/Language facilitation  TR Interventions    SW/CM Interventions Discharge Planning, Psychosocial Support, Patient/Family Education    Team Discharge Planning: Destination: PT-Home ,OT- Home , SLP-Home Projected Follow-up: PT-Home health PT, Outpatient PT, OT-  Home  health OT, 24 hour supervision/assistance, SLP-Home Health SLP, Outpatient SLP, Skilled Nursing facility, 24 hour supervision/assistance Projected Equipment Needs: PT-To be determined, OT- 3 in 1 bedside comode, Tub/shower bench, SLP-To be determined Equipment Details: PT- , OT-  Patient/family involved in discharge planning: PT- Patient, Family member/caregiver,  OT-Patient, Family member/caregiver, SLP-Patient, Family member/caregiver  MD ELOS: 21-24 days. Medical Rehab  Prognosis:  Good Assessment: 76 y.o. female with history of chronic abdominal and LBP, CKD, hyperlipidemia who fell out of bed on 5/13 am and was found to have left facial weakness with slurred speech and right sided weakness. CT head done revealing hyperdense L-MCA sign with hypodensity in left temporal area. CTA showed left M1 occlusion due to acute thrombus or embolus with significant penumbra left temporoparietal lobe and irregular non-calcified plaque L-ICA. She underwent cerebral angio with complete revascularization of L-MCA with reperfusion. Follow up CCT showed hemorrhagic transformation of left temporal infarct, large area of hemorrhage and extravasated contrast in left basal ganglia, minimal SDH along tentorium and 6 mm midline shift to right. MRI brain  L-MCA infarct with diffusion restriction left temporal lobe, superimposed 5.5 cm intra-axial hemorrhage centered at left basal ganglia with associated left hemisphere edema, effaced left lateral ventricle with trace IVH and occasional small foci of restriction in left thalamus and anterior left occipital lobe. She was extubated 5/14 pm and Dr. Acie Fredrickson consulted for input on new onset A fib on telemetry. EKG without evidence of A fib. 2 D echo showed EF 55-60% with mild mitral regurg. He felt that patient with multifocal PACs and might be having bouts of A fib due to stress v/s sinus arrhthymias. Question event monitor v/s loop to evaluate for arrhthymias--to have 30 day monitor at  discharge and loop recorder if monitor unrevealing.  BLE ultrasound without DVT--right groin limited due to bandage.  FEES done showing silent penetration with liquids--D1, honey by teaspoon recommended due to dysphagia.  Dr. Erlinda Hong felt that stroke embolic due to unknown source and to start ASA on 5/24 for secondary stroke prevention.  Speech evaluation revealed moderate dysarthria with expressive > receptive aphasia with phonemic paraphasias and anomia. Patient with resultant right sided weakness with right lean, right inattention, motor apraxia, dysphagia, and DOE. Will set goals for Min A with PT/OT and supervision for swallowing, Mod A for safety with SLP.  See Team Conference Notes for weekly updates to the plan of care

## 2016-12-04 NOTE — Care Management Note (Signed)
Inpatient [facility] Individual Statement of Services  Patient Name:  Valerie Mcclure  Date:  12/04/2016  Welcome to the [facility].  Our goal is to provide you with an individualized program based on your diagnosis and situation, designed to meet your specific needs.  With this comprehensive rehabilitation program, you will be expected to participate in at least 3 hours of rehabilitation therapies Monday-Friday, with modified therapy programming on the weekends.  Your rehabilitation program will include the following services:  Physical Therapy (PT), Occupational Therapy (OT), Speech Therapy (ST), 24 hour per day rehabilitation nursing, Therapeutic Recreaction (TR), Neuropsychology, Case Management (Social Worker), Rehabilitation Medicine, Nutrition Services and Pharmacy Services  Weekly team conferences will be held on Wednesdays to discuss your progress.  Your Social Worker will talk with you frequently to get your input and to update you on team discussions.  Team conferences with you and your family in attendance may also be held.  Expected length of stay: 18-21 days    Overall anticipated outcome: minimal assistance  Depending on your progress and recovery, your program may change. Your Social Worker will coordinate services and will keep you informed of any changes. Your Social Worker's name and contact numbers are listed  below.  The following services may also be recommended but are not provided by the [facility] will be made to provide these services after discharge if needed.  Arrangements include referral to agencies that provide these services.  Your insurance has been verified to be:  [facility] Medicare Your primary doctor is:  Celso Amy  Pertinent information will be shared with your doctor and your insurance  company.  Social Worker:  [place], [place] or ([phone]   Information discussed with and copy given to patient by: Lennart Pall, 12/04/2016, 4:16 PM

## 2016-12-04 NOTE — Progress Notes (Signed)
Social Work  Social Work Assessment and Plan  Patient Details  Name: Valerie Mcclure MRN: [id number] Date of Birth: March 31, 1941  Today's Date: 12/04/2016  Problem List:  Patient Active Problem List   Diagnosis Date Noted  . Anemia of chronic disease   . Benign essential HTN   . Dysphagia, post-stroke   . Expressive aphasia   . Anterior cerebral circulation hemorrhagic infarction ([place]) 12/03/2016  . PAC (premature atrial contraction) 12/03/2016  . Acute ischemic left MCA stroke ([place]) 12/03/2016  . Aphasia as late effect of stroke 12/03/2016  . Right hemiparesis ([place])   . Nontraumatic subcortical hemorrhage of left cerebral hemisphere ([place])   . Acute embolic stroke ([place]) 75/64/3329  . Mild aortic regurgitation 08/20/2016  . Bilateral edema of lower extremity 04/07/2016  . Pancreatic duct dilated 01/09/2016  . CKD (chronic kidney disease) 12/25/2015  . Mild tricuspid regurgitation 12/25/2015  . Mild mitral regurgitation 12/25/2015  . Prediabetes 06/10/2015  . Edema 06/04/2015  . Abdominal pain, chronic, right upper quadrant 06/04/2015  . Postmenopausal disorder 04/03/2014  . Chronic low back pain   . PAIN IN THORACIC SPINE 04/18/2010  . Coronary atherosclerosis 01/24/2009  . DEGENERATIVE JOINT DISEASE 01/23/2009  . ARTHRITIS, LEFT KNEE 12/13/2008  . HERNIATED LUMBAR DISC 12/13/2008  . Hypothyroidism 11/21/2007  . Hyperlipidemia 11/09/2007  . ANEMIA 11/09/2007  . Essential hypertension 11/09/2007  . Osteopenia 11/09/2007   Past Medical History:  Past Medical History:  Diagnosis Date  . Blood transfusion without reported diagnosis   . Chronic low back pain   . Hyperlipidemia   . Osteopenia   . Unspecified essential hypertension   . Unspecified hypothyroidism    Past Surgical History:  Past Surgical History:  Procedure Laterality Date  . ABDOMINAL HYSTERECTOMY  1970  . CHOLECYSTECTOMY  07/2009   Dr. Rise Patience  . COLONOSCOPY  2003  . FLEXIBLE SIGMOIDOSCOPY  2010   . HAND SURGERY    . IR ANGIO INTRA EXTRACRAN SEL COM CAROTID INNOMINATE UNI L MOD SED  11/29/2016  . IR ANGIO VERTEBRAL SEL SUBCLAVIAN INNOMINATE UNI R MOD SED  11/29/2016  . IR PERCUTANEOUS ART THROMBECTOMY/INFUSION INTRACRANIAL INC DIAG ANGIO  11/29/2016  . LUMBAR LAMINECTOMY  11/2008   Done by Dr. Patrice Paradise  . RADIOLOGY WITH ANESTHESIA N/A 11/29/2016   Procedure: RADIOLOGY WITH ANESTHESIA;  Surgeon: Radiologist, Medication, MD;  Location: [place];  Service: Radiology;  Laterality: N/A;  . THYROIDECTOMY     Social History:  reports that she has never smoked. She has never used smokeless tobacco. She reports that she does not drink alcohol or use drugs.  Family / Support Systems Marital Status: Married How Long?: 23 yrs (2nd marriage for both) Patient Roles: Spouse, Parent Spouse/Significant Other: Mirielle Byrum @ [phone] Children: daughter, Jim [facility]) @ [phone]) [phone]; daughter, [name]) @ [phone]) [phone]; step-daughter, Vergia Alberts (local) @ [phone] Other Supports: extended family in area Anticipated Caregiver: Dtr, step daughter and spouse Ability/Limitations of Caregiver: Husband is retired and can assist.  Dtr and step daughter can alternate. Caregiver Availability: 24/7 Family Dynamics: Pt describes all family as supportive.  Daughter, notes that her step-father is very supportive but does have health issues.  Limited support from husband's sons.  Social History Preferred language: English Religion: Baptist Cultural Background: NA Read: Yes Write: Yes Employment Status: Retired Freight forwarder Issues: None Guardian/Conservator: None - per MD, pt is not capable of making decisions on her own behalf - defer to spouse  Abuse/Neglect Physical Abuse: Denies Verbal Abuse: Denies Sexual Abuse: Denies Exploitation of patient/patient's resources: Denies Self-Neglect: Denies  Emotional Status Pt's affect, behavior adn adjustment  status: Pt lying in bed and very fatigued from first day of therapy.   She attempts to complete interview as best as she can but speech is slurred and has to repeat at times.  Answer are accurate per daughter.  She admits to much frustration with her limitations and "rolls eyes" and she states, "you don't know how bad this is...."  We spoke about neuropsychology input and she is agreeable to this. Recent Psychosocial Issues: Husband recently had a heart attack but recovering well. Pyschiatric History: None Substance Abuse History: None  Patient / Family Perceptions, Expectations & Goals Pt/Family understanding of illness & functional limitations: Pt and family with basic understanding of her stroke, IR retreival of clot and current functional limitations/ need for CIR. Premorbid pt/family roles/activities: Pt was completely independent and active home and in the community. Anticipated changes in roles/activities/participation: Initial tx goals being set for minimal assistance - husband and family will need to assume some hands on caregiver roles as they are able. Pt/family expectations/goals: Pt states desire to "do for myself..."  [employer]: None Premorbid Home Care/DME Agencies: None Transportation available at discharge: yes Resource referrals recommended: Neuropsychology, Support group (specify)  Discharge Planning Living Arrangements: Spouse/significant other Support Systems: Spouse/significant other, Children, Other relatives, Friends/neighbors Type of Residence: Private residence Insurance Resources: [employer] ([place] [employer]) Museum/gallery curator Resources: [place] Referred: No Living Expenses: Education officer, community Management: Spouse Does the patient have any problems obtaining your medications?: No Home Management: pt and spouse Patient/Family Preliminary Plans: Pt to d/c home with elderly spouse with recent health issues.  Family able to provide  added support for them both. Social Work Anticipated Follow Up Needs: HH/OP Expected length of stay: 19-22 days  Clinical Impression Pleasant woman here following a CVA and with physical and speech deficits.  Daughter at bedside and very supportive.  Both report that pt's spouse has recent health issues and really cannot provide much more than supervision.  Local family can provide additional assist.  Pt admits to some emotional adjustment issues related to loss of independence and agreeable with neuropsychology involvement.  Will follow.  Lanore Renderos 12/04/2016, 4:39 PM

## 2016-12-04 NOTE — Progress Notes (Signed)
Patient information reviewed and entered into eRehab system by Laelani Vasko, RN, CRRN, PPS Coordinator.  Information including medical coding and functional independence measure will be reviewed and updated through discharge.     Per nursing patient was given "Data Collection Information Summary for Patients in Inpatient Rehabilitation Facilities with attached "Privacy Act Statement-Health Care Records" upon admission.  

## 2016-12-05 ENCOUNTER — Inpatient Hospital Stay (HOSPITAL_COMMUNITY): Payer: Medicare Other | Admitting: Occupational Therapy

## 2016-12-05 ENCOUNTER — Inpatient Hospital Stay (HOSPITAL_COMMUNITY): Payer: Medicare Other

## 2016-12-05 ENCOUNTER — Inpatient Hospital Stay (HOSPITAL_COMMUNITY): Payer: Medicare Other | Admitting: Speech Pathology

## 2016-12-05 DIAGNOSIS — N3000 Acute cystitis without hematuria: Secondary | ICD-10-CM

## 2016-12-05 MED ORDER — MORPHINE SULFATE 15 MG PO TABS
15.0000 mg | ORAL_TABLET | Freq: Two times a day (BID) | ORAL | Status: DC
  Administered 2016-12-05 – 2016-12-06 (×3): 15 mg via ORAL
  Filled 2016-12-05 (×4): qty 1

## 2016-12-05 MED ORDER — PANTOPRAZOLE SODIUM 40 MG PO PACK
40.0000 mg | PACK | Freq: Every day | ORAL | Status: DC
  Administered 2016-12-05 – 2016-12-10 (×6): 40 mg
  Filled 2016-12-05 (×4): qty 20

## 2016-12-05 NOTE — Progress Notes (Signed)
[facility] PHYSICAL [facility]ICINE & REHABILITATION     PROGRESS NOTE  Subjective/Complaints:  Feels tired, but has no specific complaints. Denies any burning with urination.  ROS: Unable to assess due to aphasia.   Objective: Vital Signs: Blood pressure (!) 154/66, pulse 78, temperature 98 F (36.7 C), temperature source Oral, resp. rate 18, height 5\' 3"  (1.6 m), weight 79.8 kg (175 lb 14.4 oz), SpO2 100 %. No results found.  Recent Labs  12/03/16 0518 12/04/16 0537  WBC 9.7 9.1  HGB 9.1* 9.2*  HCT 29.4* 30.1*  PLT 199 194    Recent Labs  12/03/16 0518 12/04/16 0537  NA 145 145  K 3.8 4.7  CL 117* 115*  GLUCOSE 125* 120*  BUN 17 20  CREATININE 1.07* 1.09*  CALCIUM 8.1* 8.4*   CBG (last 3)  No results for input(s): GLUCAP in the last 72 hours.  Wt Readings from Last 3 Encounters:  12/03/16 79.8 kg (175 lb 14.4 oz)  11/29/16 79.5 kg (175 lb 4.3 oz)  07/31/16 74.8 kg (165 lb)    Physical Exam:  BP (!) 154/66 (BP Location: Right Arm)   Pulse 78   Temp 98 F (36.7 C) (Oral)   Resp 18   Ht 5\' 3"  (1.6 m)   Wt 79.8 kg (175 lb 14.4 oz)   SpO2 100%   BMI 31.16 kg/m  Constitutional: She appears well-developedand well-nourished.  HENT: Normocephalicand atraumatic.  Eyes: EOMI. No discharge.  Cardiovascular: Normal rateand regular rhythm. No JVD. Respiratory: Effort normaland breath sounds normal.  GI: Soft. Bowel sounds are normal.  Musculoskeletal: She no edema and no tenderness.  Neurological: She is alertand oriented.  Right facial weakness with dysarthria.  Expressive >receptive aphasia.  Motor RUE: 0/5 proximal to distal RLE: 2/5 proximal to distal LUE/LLE: 4/5 throughout Skin: Skin is warmand dry.  Psychiatric: She has a normal mood and affect. Her behavior is normal.    Assessment/Plan: 1. Functional deficits secondary to left MCA distribution infarct which require 3+ hours per day of interdisciplinary therapy in a comprehensive inpatient rehab  setting. Physiatrist is providing close team supervision and 24 hour management of active medical problems listed below. Physiatrist and rehab team continue to assess barriers to discharge/monitor patient progress toward functional and medical goals.  Function:  Bathing Bathing position   Position: Bed (UB bathing seated EOB, perineal area bed level)  Bathing parts Body parts bathed by patient: Chest, Abdomen, Right upper leg, Left upper leg Body parts bathed by helper: Left arm, Front perineal area, Buttocks, Right arm, Right lower leg, Left lower leg, Back  Bathing assist        Upper Body Dressing/Undressing Upper body dressing   What is the patient wearing?: Pull over shirt/dress       Pull over shirt/dress - Perfomed by helper: Thread/unthread right sleeve, Thread/unthread left sleeve, Put head through opening, Pull shirt over trunk        Upper body assist        Lower Body Dressing/Undressing Lower body dressing   What is the patient wearing?: Pants, Non-skid slipper socks     Pants- Performed by patient: Thread/unthread right pants leg Pants- Performed by helper: Thread/unthread left pants leg, Pull pants up/down, Fasten/unfasten pants   Non-skid slipper socks- Performed by helper: Don/doff right sock, Don/doff left sock                  Lower body assist Assist for lower body dressing: 2 Helpers  Toileting Toileting Toileting activity did not occur: No continent bowel/bladder event        Toileting assist     Transfers Chair/bed transfer Chair/bed transfer activity did not occur: N/A   Chair/bed transfer assist level: Maximal assist (Pt 25 - 49%/lift and lower)       Locomotion Ambulation     Max distance: 20 Assist level: Maximal assist (Pt 25 - 49%)   Wheelchair       Assist Level: Dependent (Pt equals 0%)  Cognition Comprehension Comprehension assist level: Understands basic 50 - 74% of the time/ requires cueing 25 - 49% of the  time  Expression Expression assist level: Expresses basic 25 - 49% of the time/requires cueing 50 - 75% of the time. Uses single words/gestures.  Social Interaction Social Interaction assist level: Interacts appropriately 25 - 49% of time - Needs frequent redirection.  Problem Solving Problem solving assist level: Solves basic 25 - 49% of the time - needs direction more than half the time to initiate, plan or complete simple activities  Memory Memory assist level: Recognizes or recalls 25 - 49% of the time/requires cueing 50 - 75% of the time    Medical Problem List and Plan: 1. Right hemiparesis and aphasia  secondary to left MCA distribution infarct  Begin CIR  Notes reviewed, images reviewed  Will consider bracing 2. DVT Prophylaxis/Anticoagulation: Mechanical: Sequential compression devices, below knee Bilateral lower extremities 3. Chronic back pain/Pain Management: On pamelor for neuropathy and MS contin bid.  4. Mood: LCSW to follow for evaluation and support.  5. Neuropsych: This patient is not capable of making decisions on her own behalf. 6. Skin/Wound Care: routine pressure relief measures.  7. Fluids/Electrolytes/Nutrition: May need IVF at nights if unable to maintain hydration.  8.HTN: Monitor BP bid. Continue avapro daily  Monitor with increased mobility 9. PAC's: Monitor heart rate bid. Currently in NSR.  10. Diastolic Heart failure: Asymptomatic.  Filed Weights   12/03/16 1630  Weight: 79.8 kg (175 lb 14.4 oz)   11.H/o anemia with multiple transfusion:  Hb 9.2 on 5/18  Labs ordered for Monday  Cont to monitor 12. Depression: has been managed on Prozac.  13. CKD: Baseline SCr- 1.4 range. Continue to monitor. Check lytes serially to monitor stability.   Cr 1.09 on 5/18  Labs ordered for Monday  Cont to monitor 14. Prediabetes: Hgb A1C- 5.8. Educate on appropriate diet as cognition improves.   Monitor with increased mobility 15. Dysphagia: On dysphagia 1, honey  liquids by tsp 16. Hypoalbuminemia  Supplement initiated 5/18 17. UTI on Bactrim, awaiting culture results. Afebrile but looks flushed and tired LOS (Days) 2 A FACE TO FACE EVALUATION WAS PERFORMED  Charlett Blake 12/05/2016 10:51 AM

## 2016-12-05 NOTE — Progress Notes (Signed)
Occupational Therapy Session Note  Patient Details  Name: Valerie Mcclure MRN: [id number] Date of Birth: 17-Feb-1941  Today's Date: 12/05/2016 OT Individual Time: 0165-5374 and 1430-1530 OT Individual Time Calculation (min): 56 min and 60 min    Short Term Goals: Week 1:  OT Short Term Goal 1 (Week 1): Pt will complete bathing with mod assist at sit > stand level  OT Short Term Goal 2 (Week 1): pt will don shirt with mod assist OT Short Term Goal 3 (Week 1): pt will don pants with mod assist at sit > stand level OT Short Term Goal 4 (Week 1): Pt will complete toilet transfers with mod assist OT Short Term Goal 5 (Week 1): Pt will locate items on Rt to complete self-care tasks with min cues  Skilled Therapeutic Interventions/Progress Updates:  Session 1:Upon entering the room, NT present attempting I and O cath and requesting assistance of therapist for bed mobility for hygiene and LB clothing management.Pt incontinent of BM and requiring total A for hygiene. Pt very lethargic this session and needing max multimodal cues to remain alert throughout session. Pt demonstrating and educating pt on self ROM for R UE. Pt returning demonstrations with mod multimodal cues for proper technique. Multiple rest breaks needed secondary to fatigue. Pt remained in bed with bed alarm activated secondary to increased lethargy.   Session 2: Upon entering the room, pt supine in bed with no c/o pain and agreeable to OT intervention. Pt performed supine >sit with HOB elevated and mod A to EOB. Sit >stand with max A and max stand pivot transfer into wheelchair. OT propelled pt outside via total A. Pt engaged in visual scanning task to locate items with mod cues for visual strategies. Pt attempting to propel wheelchair partially towards room for 10' with hemiplegic technique. Pt able to propel with L UE but unable to utilize L LE for task and needing assist to not hit objects on R side. Quick release belt donned and pt  remained in room with call bell and all needed items within reach upon exiting the room.   Therapy Documentation Precautions:  Precautions Precautions: Fall Restrictions Weight Bearing Restrictions: No General:   Vital Signs: Therapy Vitals Temp: 98 F (36.7 C) Temp Source: Oral Pulse Rate: 78 Resp: 18 BP: (!) 154/66 Patient Position (if appropriate): Lying Oxygen Therapy SpO2: 100 % O2 Device: Not Delivered Pain:   ADL:   Vision   Perception    Praxis   Exercises:   Other Treatments:    See Function Navigator for Current Functional Status.   Therapy/Group: Individual Therapy  Gypsy Decant 12/05/2016, 8:57 AM

## 2016-12-05 NOTE — Plan of Care (Signed)
Problem: RH BLADDER ELIMINATION Goal: RH STG MANAGE BLADDER WITH ASSISTANCE STG Manage Bladder With min Assistance   Outcome: Not Progressing Requiring I/O caths q 6 hours

## 2016-12-05 NOTE — Progress Notes (Signed)
Physical Therapy Note  Patient Details  Name: Valerie Mcclure MRN: [id number] Date of Birth: 05-Aug-1940 Today's Date: 12/05/2016  1610-1644, 34 min individual tx Pain:no c/o  Pt lethargic, somnolent resting in bed.  Husband Buthch asked about her edematous R hand; PT educated him on elevating hand above heart with pillows, and retrograde massage using lotion. With mod cues to stay awake, she participated well in neuro re-ed and bed mobility. With mulitmodal cues, pt moved RLE: heel slides, hip abd/adduction, ankle circles, bil LEs for lower trunk rotation and bridging.  Pt unable to clear hips, but did tilt pelvis. Rolling in flat bed without use of rails with min assist, after set- up of bil LEs and RUE.  See function navigator for current status.   Dayvon Dax 12/05/2016, 5:00 PM

## 2016-12-05 NOTE — Progress Notes (Signed)
Speech Language Pathology Daily Session Note  Patient Details  Name: Valerie Mcclure MRN: [id number] Date of Birth: May 15, 1941  Today's Date: 12/05/2016 SLP Individual Time: 1119-1200 SLP Individual Time Calculation (min): 41 min  Short Term Goals: Week 1: SLP Short Term Goal 1 (Week 1): Pt will consume therapeutic trials of ice chips with minimal overt s/s of aspiration and min verbal cues for use of swallowing precautions prior to repeat objective assessment  SLP Short Term Goal 2 (Week 1): Pt will consume dys 1 textures and honey thick liquids via teaspoon with min assist verbal cues for use of swallowing precautions.   SLP Short Term Goal 3 (Week 1): Pt will name basic, familiar items with mod assist verbal cues for >75% accuracy.  SLP Short Term Goal 4 (Week 1): Pt will follow 2 step commands during functional tasks with min assist verbal and visual cues.   SLP Short Term Goal 5 (Week 1): Pt will recognize and correct verbal errors during structured tasks with mod assist verbal cues.    Skilled Therapeutic Interventions:  Pt was seen for skilled ST targeting cognitive-linguistic goals.  Pt was able to answer functional yes/no questions pertaining to her immediate needs/wants with supervision.  She followed 1 and 2 step commands when using the [place] to transfer from bed to wheelchair with min assist verbal and visual cues.  Pt was able to match word to object from a field of three for 100% accuracy with mod I.  Pt was also able to name basic familiar objects in 3 out of 10 opportunities independently, which improved to 10 out of 10 accuracy with written visual models, sentence completion cues, and phonemic placement cues.  Pt was returned to room at the end of today's therapy session and left in wheelchair with daughter at bedside.  Continue per current plan of care.    Function:  Eating Eating                 Cognition Comprehension Comprehension assist level: Understands basic  75 - 89% of the time/ requires cueing 10 - 24% of the time  Expression   Expression assist level: Expresses basic 25 - 49% of the time/requires cueing 50 - 75% of the time. Uses single words/gestures.  Social Interaction Social Interaction assist level: Interacts appropriately 50 - 74% of the time - May be physically or verbally inappropriate.  Problem Solving Problem solving assist level: Solves basic 25 - 49% of the time - needs direction more than half the time to initiate, plan or complete simple activities  Memory Memory assist level: Recognizes or recalls 25 - 49% of the time/requires cueing 50 - 75% of the time    Pain Pain Assessment Pain Assessment: No/denies pain  Therapy/Group: Individual Therapy  Clorine Swing, Selinda Orion 12/05/2016, 11:55 AM

## 2016-12-06 ENCOUNTER — Inpatient Hospital Stay (HOSPITAL_COMMUNITY): Payer: Medicare Other | Admitting: Physical Therapy

## 2016-12-06 NOTE — Progress Notes (Signed)
[place] PHYSICAL MEDICINE & REHABILITATION     PROGRESS NOTE  Subjective/Complaints:  On MS Contin at home for chronic low back pain. Because it cannot be crushed switch to MSIR 15 every 12, patient's pain complaints have not changed with the switch. States her pain is well controlled.  ROS: Unable to assess due to aphasia. Inconsistent with responses  Objective: Vital Signs: Blood pressure (!) 152/66, pulse 71, temperature 97.7 F (36.5 C), temperature source Oral, resp. rate 16, height 5\' 3"  (1.6 m), weight 79.8 kg (175 lb 14.4 oz), SpO2 98 %. No results found.  Recent Labs  12/04/16 0537  WBC 9.1  HGB 9.2*  HCT 30.1*  PLT 194    Recent Labs  12/04/16 0537  NA 145  K 4.7  CL 115*  GLUCOSE 120*  BUN 20  CREATININE 1.09*  CALCIUM 8.4*   CBG (last 3)  No results for input(s): GLUCAP in the last 72 hours.  Wt Readings from Last 3 Encounters:  12/03/16 79.8 kg (175 lb 14.4 oz)  11/29/16 79.5 kg (175 lb 4.3 oz)  07/31/16 74.8 kg (165 lb)    Physical Exam:  BP (!) 152/66   Pulse 71   Temp 97.7 F (36.5 C) (Oral)   Resp 16   Ht 5\' 3"  (1.6 m)   Wt 79.8 kg (175 lb 14.4 oz)   SpO2 98%   BMI 31.16 kg/m  Constitutional: She appears well-developedand well-nourished.  HENT: Normocephalicand atraumatic.  Eyes: EOMI. No discharge.  Cardiovascular: Normal rateand regular rhythm. No JVD. Respiratory: Effort normaland breath sounds normal.  GI: Soft. Bowel sounds are normal.  Musculoskeletal: She no edema and no tenderness.  Neurological: She is alertand oriented.  Right facial weakness with dysarthria.  Expressive >receptive aphasia.  Motor RUE: 0/5 proximal to distal RLE: 2/5 proximal to distal LUE/LLE: 4/5 throughout Skin: Skin is warmand dry.  Psychiatric: She has a normal mood and affect. Her behavior is normal.    Assessment/Plan: 1. Functional deficits secondary to left MCA distribution infarct which require 3+ hours per day of interdisciplinary  therapy in a comprehensive inpatient rehab setting. Physiatrist is providing close team supervision and 24 hour management of active medical problems listed below. Physiatrist and rehab team continue to assess barriers to discharge/monitor patient progress toward functional and medical goals.  Function:  Bathing Bathing position   Position: Bed (UB bathing seated EOB, perineal area bed level)  Bathing parts Body parts bathed by patient: Chest, Abdomen, Right upper leg, Left upper leg Body parts bathed by helper: Left arm, Front perineal area, Buttocks, Right arm, Right lower leg, Left lower leg, Back  Bathing assist        Upper Body Dressing/Undressing Upper body dressing   What is the patient wearing?: Pull over shirt/dress     Pull over shirt/dress - Perfomed by patient: Thread/unthread right sleeve, Thread/unthread left sleeve, Put head through opening, Pull shirt over trunk Pull over shirt/dress - Perfomed by helper: Thread/unthread right sleeve, Thread/unthread left sleeve, Put head through opening, Pull shirt over trunk        Upper body assist        Lower Body Dressing/Undressing Lower body dressing   What is the patient wearing?: Pants     Pants- Performed by patient: Thread/unthread right pants leg Pants- Performed by helper: Thread/unthread right pants leg, Thread/unthread left pants leg, Pull pants up/down   Non-skid slipper socks- Performed by helper: Don/doff right sock, Don/doff left sock  Lower body assist Assist for lower body dressing: 2 Helpers      Toileting Toileting Toileting activity did not occur: No continent bowel/bladder event        Toileting assist     Transfers Chair/bed transfer Chair/bed transfer activity did not occur: N/A   Chair/bed transfer assist level: Maximal assist (Pt 25 - 49%/lift and lower)       Locomotion Ambulation     Max distance: 20 Assist level: Maximal assist (Pt 25 - 49%)    Wheelchair     Max wheelchair distance: 10' Assist Level: Total assistance (Pt < 25%)  Cognition Comprehension Comprehension assist level: Understands basic 75 - 89% of the time/ requires cueing 10 - 24% of the time  Expression Expression assist level: Expresses basic 25 - 49% of the time/requires cueing 50 - 75% of the time. Uses single words/gestures.  Social Interaction Social Interaction assist level: Interacts appropriately 50 - 74% of the time - May be physically or verbally inappropriate.  Problem Solving Problem solving assist level: Solves basic 25 - 49% of the time - needs direction more than half the time to initiate, plan or complete simple activities  Memory Memory assist level: Recognizes or recalls 25 - 49% of the time/requires cueing 50 - 75% of the time    Medical Problem List and Plan: 1. Right hemiparesis and aphasia  secondary to left MCA distribution infarct  Cont CIR, PT, OT, SLP 2. DVT Prophylaxis/Anticoagulation: Mechanical: Sequential compression devices, below knee Bilateral lower extremities 3. Chronic back pain/Pain Management: On pamelor for neuropathy and MS contin bid.  4. Mood: LCSW to follow for evaluation and support.  5. Neuropsych: This patient is not capable of making decisions on her own behalf. 6. Skin/Wound Care: routine pressure relief measures.  7. Fluids/Electrolytes/Nutrition: May need IVF at nights if unable to maintain hydration.  8.HTN: Monitor BP bid. Continue avapro daily  Monitor with increased mobility 9. PAC's: Monitor heart rate bid. Currently in NSR.  10. Diastolic Heart failure: Asymptomatic.  Filed Weights   12/03/16 1630  Weight: 79.8 kg (175 lb 14.4 oz)   11.H/o anemia with multiple transfusion:  Hb 9.2 on 5/18  Labs ordered for Monday  Cont to monitor 12. Depression: has been managed on Prozac.  13. CKD: Baseline SCr- 1.4 range. Continue to monitor. Check lytes serially to monitor stability.   Cr 1.09 on  5/18  Labs ordered for Monday  Cont to monitor 14. Prediabetes: Hgb A1C- 5.8. Educate on appropriate diet as cognition improves.   Monitor with increased mobility 15. Dysphagia: On dysphagia 1, honey liquids by tsp 16. Hypoalbuminemia  Supplement initiated 5/18 17. UTI on Bactrim, awaiting culture results. Afebrile , 100K E coli sens pnd LOS (Days) 3 A FACE TO FACE EVALUATION WAS PERFORMED  Alysia Penna E 12/06/2016 11:41 AM

## 2016-12-07 ENCOUNTER — Inpatient Hospital Stay (HOSPITAL_COMMUNITY): Payer: Medicare Other | Admitting: Speech Pathology

## 2016-12-07 ENCOUNTER — Inpatient Hospital Stay (HOSPITAL_COMMUNITY): Payer: Medicare Other | Admitting: Occupational Therapy

## 2016-12-07 ENCOUNTER — Inpatient Hospital Stay (HOSPITAL_COMMUNITY): Payer: Medicare Other | Admitting: Physical Therapy

## 2016-12-07 DIAGNOSIS — N390 Urinary tract infection, site not specified: Secondary | ICD-10-CM

## 2016-12-07 DIAGNOSIS — B9620 Unspecified Escherichia coli [E. coli] as the cause of diseases classified elsewhere: Secondary | ICD-10-CM

## 2016-12-07 DIAGNOSIS — R7303 Prediabetes: Secondary | ICD-10-CM

## 2016-12-07 LAB — BASIC METABOLIC PANEL
Anion gap: 8 (ref 5–15)
BUN: 24 mg/dL — AB (ref 6–20)
CO2: 27 mmol/L (ref 22–32)
CREATININE: 1.09 mg/dL — AB (ref 0.44–1.00)
Calcium: 8.2 mg/dL — ABNORMAL LOW (ref 8.9–10.3)
Chloride: 102 mmol/L (ref 101–111)
GFR calc Af Amer: 55 mL/min — ABNORMAL LOW (ref >60)
GFR, EST NON AFRICAN AMERICAN: 48 mL/min — AB (ref >60)
Glucose, Bld: 110 mg/dL — ABNORMAL HIGH (ref 65–99)
Potassium: 4.6 mmol/L (ref 3.5–5.1)
SODIUM: 137 mmol/L (ref 135–145)

## 2016-12-07 LAB — URINE CULTURE

## 2016-12-07 LAB — CBC WITH DIFFERENTIAL/PLATELET
Basophils Absolute: 0 10*3/uL (ref 0.0–0.1)
Basophils Relative: 0 %
Eosinophils Absolute: 0.2 10*3/uL (ref 0.0–0.7)
Eosinophils Relative: 2 %
HCT: 29.3 % — ABNORMAL LOW (ref 36.0–46.0)
Hemoglobin: 9.6 g/dL — ABNORMAL LOW (ref 12.0–15.0)
LYMPHS ABS: 2.1 10*3/uL (ref 0.7–4.0)
Lymphocytes Relative: 23 %
MCH: 29.8 pg (ref 26.0–34.0)
MCHC: 32.8 g/dL (ref 30.0–36.0)
MCV: 91 fL (ref 78.0–100.0)
MONO ABS: 0.6 10*3/uL (ref 0.1–1.0)
Monocytes Relative: 6 %
Neutro Abs: 6.3 10*3/uL (ref 1.7–7.7)
Neutrophils Relative %: 69 %
Platelets: 243 10*3/uL (ref 150–400)
RBC: 3.22 MIL/uL — AB (ref 3.87–5.11)
RDW: 13.8 % (ref 11.5–15.5)
WBC: 9.2 10*3/uL (ref 4.0–10.5)

## 2016-12-07 MED ORDER — MORPHINE SULFATE ER 15 MG PO TBCR
15.0000 mg | EXTENDED_RELEASE_TABLET | Freq: Two times a day (BID) | ORAL | Status: DC
Start: 2016-12-07 — End: 2016-12-19
  Administered 2016-12-07 – 2016-12-19 (×25): 15 mg via ORAL
  Filled 2016-12-07 (×25): qty 1

## 2016-12-07 MED ORDER — SULFAMETHOXAZOLE-TRIMETHOPRIM 400-80 MG PO TABS
1.0000 | ORAL_TABLET | Freq: Two times a day (BID) | ORAL | Status: AC
  Administered 2016-12-07 – 2016-12-11 (×8): 1 via ORAL
  Filled 2016-12-07 (×8): qty 1

## 2016-12-07 NOTE — Progress Notes (Addendum)
[place] PHYSICAL MEDICINE & REHABILITATION     PROGRESS NOTE  Subjective/Complaints:  Pt seen laying in bed this AM.  Family at bedside, state pt slept well overnight and is doing better overall.    ROS: Unable to assess due to aphasia.   Objective: Vital Signs: Blood pressure (!) 156/72, pulse 81, temperature 98.7 F (37.1 C), temperature source Oral, resp. rate 18, height 5\' 3"  (1.6 m), weight 79.8 kg (175 lb 14.4 oz), SpO2 97 %. No results found.  Recent Labs  12/07/16 0642  WBC PENDING  HGB 9.6*  HCT 29.3*  PLT 243    Recent Labs  12/07/16 0642  NA 137  K 4.6  CL 102  GLUCOSE 110*  BUN 24*  CREATININE 1.09*  CALCIUM 8.2*   CBG (last 3)  No results for input(s): GLUCAP in the last 72 hours.  Wt Readings from Last 3 Encounters:  12/03/16 79.8 kg (175 lb 14.4 oz)  11/29/16 79.5 kg (175 lb 4.3 oz)  07/31/16 74.8 kg (165 lb)    Physical Exam:  BP (!) 156/72   Pulse 81   Temp 98.7 F (37.1 C) (Oral)   Resp 18   Ht 5\' 3"  (1.6 m)   Wt 79.8 kg (175 lb 14.4 oz)   SpO2 97%   BMI 31.16 kg/m  Constitutional: She appears well-developedand well-nourished.  HENT: Normocephalicand atraumatic.  Eyes: EOMI. No discharge.  Cardiovascular: RRR. No JVD. Respiratory: Effort normal and breath sounds normal.  GI: Soft. Bowel sounds are normal.  Musculoskeletal: She no edema and no tenderness.  Neurological: She is alertand oriented.  Right facial weakness with dysarthria.  Expressive >>receptive aphasia.  Motor RUE: 1/5 shoulder abduction, 0/5 distally RLE: 3+/5 HF, KE, 2+/5 ADF/PF LUE/LLE: 4/5 throughout Skin: Skin is warmand dry.  Psychiatric: She has a normal mood and affect. Her behavior is normal.    Assessment/Plan: 1. Functional deficits secondary to left MCA distribution infarct which require 3+ hours per day of interdisciplinary therapy in a comprehensive inpatient rehab setting. Physiatrist is providing close team supervision and 24 hour  management of active medical problems listed below. Physiatrist and rehab team continue to assess barriers to discharge/monitor patient progress toward functional and medical goals.  Function:  Bathing Bathing position   Position: Bed (UB bathing seated EOB, perineal area bed level)  Bathing parts Body parts bathed by patient: Chest, Abdomen, Right upper leg, Left upper leg Body parts bathed by helper: Left arm, Front perineal area, Buttocks, Right arm, Right lower leg, Left lower leg, Back  Bathing assist        Upper Body Dressing/Undressing Upper body dressing   What is the patient wearing?: Pull over shirt/dress     Pull over shirt/dress - Perfomed by patient: Thread/unthread right sleeve, Thread/unthread left sleeve, Put head through opening, Pull shirt over trunk Pull over shirt/dress - Perfomed by helper: Thread/unthread right sleeve, Thread/unthread left sleeve, Put head through opening, Pull shirt over trunk        Upper body assist        Lower Body Dressing/Undressing Lower body dressing   What is the patient wearing?: Pants     Pants- Performed by patient: Thread/unthread right pants leg Pants- Performed by helper: Thread/unthread right pants leg, Thread/unthread left pants leg, Pull pants up/down   Non-skid slipper socks- Performed by helper: Don/doff right sock, Don/doff left sock                  Lower body  assist Assist for lower body dressing: 2 Helpers      Toileting Toileting Toileting activity did not occur: No continent bowel/bladder event        Toileting assist     Transfers Chair/bed transfer Chair/bed transfer activity did not occur: N/A   Chair/bed transfer assist level: Maximal assist (Pt 25 - 49%/lift and lower)       Locomotion Ambulation     Max distance: 20 Assist level: Maximal assist (Pt 25 - 49%)   Wheelchair     Max wheelchair distance: 10' Assist Level: Total assistance (Pt < 25%)  Cognition Comprehension  Comprehension assist level: Understands basic 75 - 89% of the time/ requires cueing 10 - 24% of the time  Expression Expression assist level: Expresses basic 50 - 74% of the time/requires cueing 25 - 49% of the time. Needs to repeat parts of sentences.  Social Interaction Social Interaction assist level: Interacts appropriately 75 - 89% of the time - Needs redirection for appropriate language or to initiate interaction.  Problem Solving Problem solving assist level: Solves basic 25 - 49% of the time - needs direction more than half the time to initiate, plan or complete simple activities  Memory Memory assist level: Recognizes or recalls 25 - 49% of the time/requires cueing 50 - 75% of the time    Medical Problem List and Plan: 1. Right hemiparesis and aphasia  secondary to left MCA distribution infarct  Cont CIR  Bracing ordered 2. DVT Prophylaxis/Anticoagulation: Mechanical: Sequential compression devices, below knee Bilateral lower extremities 3. Chronic back pain/Pain Management: On pamelor for neuropathy and MS contin bid.  4. Mood: LCSW to follow for evaluation and support.  5. Neuropsych: This patient is not capable of making decisions on her own behalf. 6. Skin/Wound Care: routine pressure relief measures.  7. Fluids/Electrolytes/Nutrition: May need IVF at nights if unable to maintain hydration.  8.HTN: Monitor BP bid. Continue avapro daily  Monitor with increased mobility  Slightly labile 5/21 9. PAC's: Monitor heart rate bid. Currently in NSR.  10. Diastolic Heart failure: Asymptomatic.  Filed Weights   12/03/16 1630  Weight: 79.8 kg (175 lb 14.4 oz)  11.H/o anemia with multiple transfusion:  Hb 9.6 on 5/21  Cont to monitor 12. Depression: has been managed on Prozac.  13. CKD: Baseline SCr- 1.4 range. Continue to monitor. Check lytes serially to monitor stability.   Cr 1.09 on 5/21  Cont to monitor 14. Prediabetes: Hgb A1C- 5.8. Educate on appropriate diet as cognition  improves.   Monitor with increased mobility  Relatively controlled 5/21 15. Dysphagia: On dysphagia 1, honey liquids by tsp 16. Hypoalbuminemia  Supplement initiated 5/18 17. E. Coli UTI   Cont Bactrim -5/23  LOS (Days) 4 A FACE TO FACE EVALUATION WAS PERFORMED  Maylynn Orzechowski Lorie Phenix 12/07/2016 10:04 AM

## 2016-12-07 NOTE — Progress Notes (Signed)
Speech Language Pathology Daily Session Note  Patient Details  Name: Valerie Mcclure MRN: [id number] Date of Birth: 1940/08/14  Today's Date: 12/07/2016 SLP Individual Time: 0930-1030 SLP Individual Time Calculation (min): 60 min  Short Term Goals: Week 1: SLP Short Term Goal 1 (Week 1): Pt will consume therapeutic trials of ice chips with minimal overt s/s of aspiration and min verbal cues for use of swallowing precautions prior to repeat objective assessment  SLP Short Term Goal 2 (Week 1): Pt will consume dys 1 textures and honey thick liquids via teaspoon with min assist verbal cues for use of swallowing precautions.   SLP Short Term Goal 3 (Week 1): Pt will name basic, familiar items with mod assist verbal cues for >75% accuracy.  SLP Short Term Goal 4 (Week 1): Pt will follow 2 step commands during functional tasks with min assist verbal and visual cues.   SLP Short Term Goal 5 (Week 1): Pt will recognize and correct verbal errors during structured tasks with mod assist verbal cues.    Skilled Therapeutic Interventions: Skilled treatment session focused on addressing dysphagia and communication goals. SLP facilitated session by providing skilled observation of patient with ice chips with no overt s/s of aspiration and what appeared to be timely swallows.  Trials advanced to thin liquids via cup which resulted in multiple swallows, watery eyes, and wet vocal quality.  Daughter reports that patient most interested in getting more solid foods; as a result, Dys.2 textures were also tried with honey-thick liquids via teaspoon with effective oral clearance and no overt s/s of aspiration.  Given documented sensory deficits recommend a repeat objective assessment tomorrow prior to bedside upgrade.  SLP also facilitated session with picture naming task; patient named pictured items in 5 out of 20 opportunities independently, which improved to 20 out of 20 accuracy with written word, sentence  completion, and phonemic placement cues.   Continue with current plan of care and plan for a repeat objective assessment tomorrow at 0930.      Function:  Eating Eating   Modified Consistency Diet: Yes Eating Assist Level: Helper feeds patient;Set up assist for   Eating Set Up Assist For: Opening containers       Cognition Comprehension Comprehension assist level: Understands basic 75 - 89% of the time/ requires cueing 10 - 24% of the time  Expression   Expression assist level: Expresses basic 25 - 49% of the time/requires cueing 50 - 75% of the time. Uses single words/gestures.  Social Interaction Social Interaction assist level: Interacts appropriately 75 - 89% of the time - Needs redirection for appropriate language or to initiate interaction.  Problem Solving Problem solving assist level: Solves basic 25 - 49% of the time - needs direction more than half the time to initiate, plan or complete simple activities  Memory Memory assist level: Recognizes or recalls 25 - 49% of the time/requires cueing 50 - 75% of the time    Pain Pain Assessment Pain Assessment: No/denies pain  Therapy/Group: Individual Therapy  Carmelia Roller., [place] [phone]  Tom Green 12/07/2016, 12:13 PM

## 2016-12-07 NOTE — Progress Notes (Signed)
Orthopedic Tech Progress Note Patient Details:  Valerie Mcclure 16-Aug-1940 [id number]  Patient ID: Valerie Mcclure, female   DOB: 02-28-1941, 76 y.o.   MRN: [id number]   Valerie Mcclure 12/07/2016, 12:27 PM Called in advanced brace order; spoke with Richardson Landry

## 2016-12-07 NOTE — Progress Notes (Signed)
Occupational Therapy Session Note  Patient Details  Name: Valerie Mcclure MRN: [id number] Date of Birth: [birth year]  Today's Date: 12/07/2016 OT Individual Time: 1346-1500 OT Individual Time Calculation (min): 74 min    Short Term Goals: Week 1:  OT Short Term Goal 1 (Week 1): Pt will complete bathing with mod assist at sit > stand level  OT Short Term Goal 2 (Week 1): pt will don shirt with mod assist OT Short Term Goal 3 (Week 1): pt will don pants with mod assist at sit > stand level OT Short Term Goal 4 (Week 1): Pt will complete toilet transfers with mod assist OT Short Term Goal 5 (Week 1): Pt will locate items on Rt to complete self-care tasks with min cues Week 2:     Skilled Therapeutic Interventions/Progress Updates:    Tx focus on Rt attention, R NMR, and standing tolerance.   Pt greeted while supine in bed, agreeable to go outside. She was escorted to outdoor environment. Sit<stand with bilateral UEs on back of metal bench fixed to concrete. Had her engage in meaningful "people watching" activity, with pt scanning across parking lot (actively to right side) and identifying outfits/colors that hospital visitors were wearing. Pt with 95% accuracy. Difficultly with identification of reds (on clothing and cars). Had her also count trees/tables/chairs when standing at elevated brick structure with bilateral UE support. Pt with 60% accuracy during task. Pt able to stand for 3 minute bouts before fatiguing (Max A sit<stand). Manual facilitation of R LE required to keep leg extended and directly under hip. AAROM of R UE while seated with pt requiring HOH assist from OT to ensure carryover of proper technique. Pt instructed not to lift R UE by fingers and encouraged joint protection strategies when handling affected UE. Afterwards pt was escorted back to room and returned to bed. She was left with all needs and daughter present at time of departure.   All stand pivot transfers completed  with Max A.   Therapy Documentation Precautions:  Precautions Precautions: Fall Restrictions Weight Bearing Restrictions: No   Vital Signs: Therapy Vitals Temp: 98.7 F (37.1 C) Temp Source: Oral Pulse Rate: 69 Resp: 18 BP: (!) 95/46 Patient Position (if appropriate): Lying Oxygen Therapy SpO2: 100 % O2 Device: Not Delivered Pain: Pain Assessment Pain Assessment: No/denies pain ADL:      See Function Navigator for Current Functional Status.   Therapy/Group: Individual Therapy  Miyana Mordecai A Alwaleed Obeso 12/07/2016, 4:00 PM

## 2016-12-07 NOTE — Progress Notes (Signed)
Physical Therapy Note  Patient Details  Name: Valerie Mcclure MRN: [id number] Date of Birth: 1941-05-04 Today's Date: 12/07/2016    Time: 827-927 60 minutes  1:1 Pt c/o Lt low back pain, RN aware, rest and repositioned during session.  Stand pivot transfers throughout session with cues at hips to assist with turning, mod A to stand.  Standing reaching task with focus on improving Lt LE wt bearing and activation. Pt able to perform card matching task in standing with mod/max A for balance, 70% accuracy for card matching.  Card matching in seated with pt able to match 100% of cards correctly with increased time.  Standing wt shifts and mini squats for posture and balance with mod/max A.  Gait with Rt UE over PT's shoulders with max A, increasing tone emerging in Rt LE making it more difficult to place foot due to supination and adductor tone.  Seated Rt UE NMR with focus on grasp and release and activating wrist and elbow mm with pt improving with tactile cues and repetition.    [name] 12/07/2016, 9:28 AM

## 2016-12-08 ENCOUNTER — Inpatient Hospital Stay (HOSPITAL_COMMUNITY): Payer: Medicare Other | Admitting: Speech Pathology

## 2016-12-08 ENCOUNTER — Inpatient Hospital Stay (HOSPITAL_COMMUNITY): Payer: Medicare Other | Admitting: Physical Therapy

## 2016-12-08 ENCOUNTER — Inpatient Hospital Stay (HOSPITAL_COMMUNITY): Payer: Medicare Other

## 2016-12-08 ENCOUNTER — Inpatient Hospital Stay (HOSPITAL_COMMUNITY): Payer: Medicare Other | Admitting: Occupational Therapy

## 2016-12-08 MED ORDER — IRBESARTAN 75 MG PO TABS
125.0000 mg | ORAL_TABLET | Freq: Every day | ORAL | Status: DC
  Administered 2016-12-09 – 2016-12-19 (×11): 112.5 mg via ORAL
  Filled 2016-12-08 (×11): qty 2

## 2016-12-08 MED ORDER — IRBESARTAN 75 MG PO TABS
37.5000 mg | ORAL_TABLET | Freq: Once | ORAL | Status: AC
  Administered 2016-12-08: 37.5 mg via ORAL
  Filled 2016-12-08: qty 1

## 2016-12-08 NOTE — Progress Notes (Signed)
Physical Therapy Session Note  Patient Details  Name: TALAR FRALEY MRN: [id number] Date of Birth: 02/09/1941  Today's Date: 12/08/2016 PT Individual Time: 0800-0830 PT Individual Time Calculation (min): 30 min   Short Term Goals: Week 1:  PT Short Term Goal 1 (Week 1): pt will perform functional transfers with mod A PT Short Term Goal 2 (Week 1): pt will perform gait with mod A x 50' in controlled environment  Skilled Therapeutic Interventions/Progress Updates: Pt presented in bed with family present agreeable to therapy. Pt performed supine to sit on L with modA, requiring cues for sequencing and hand placement. Pt able to follow instruction to assist with scooting R hip forward towards EOB. Pt participated in squat pivot transfers mod/maxA x 4 bed to/from w/c. Pt required assist for L hand placement. Performed standing balance using Stedy, 2 minute bouts with multimodal cues for maintaining midline which pt was able to correct after cues. Performed static reaching with LUE while in Stedy 2/4 trials. Pt returned to bed due to [facility] following therapy session. Performed sit to supine with mod/maxA and total assist for boosting to [facility]. Pt in bed at end of session with bed alarm on and family present.      Therapy Documentation Precautions:  Precautions Precautions: Fall Restrictions Weight Bearing Restrictions: No  See Function Navigator for Current Functional Status.   Therapy/Group: Individual Therapy  Bannon Giammarco  Carolle Ishii, PTA  12/08/2016, 12:17 PM

## 2016-12-08 NOTE — Progress Notes (Signed)
[facility] PHYSICAL [facility]ICINE & REHABILITATION     PROGRESS NOTE  Subjective/Complaints:  Pt seen laying in bed this AM.  She slept well overnight. Daughter at bedside.  She has her braces in place.   ROS: Unable to assess due to aphasia, appears to deny CP, SOB, N/V/D.   Objective: Vital Signs: Blood pressure (!) 136/50, pulse 66, temperature 98.5 F (36.9 C), temperature source Oral, resp. rate 14, height 5\' 3"  (1.6 m), weight 79.8 kg (175 lb 14.4 oz), SpO2 98 %. No results found.  Recent Labs  12/07/16 0642  WBC 9.2  HGB 9.6*  HCT 29.3*  PLT 243    Recent Labs  12/07/16 0642  NA 137  K 4.6  CL 102  GLUCOSE 110*  BUN 24*  CREATININE 1.09*  CALCIUM 8.2*   CBG (last 3)  No results for input(s): GLUCAP in the last 72 hours.  Wt Readings from Last 3 Encounters:  12/03/16 79.8 kg (175 lb 14.4 oz)  11/29/16 79.5 kg (175 lb 4.3 oz)  07/31/16 74.8 kg (165 lb)    Physical Exam:  BP (!) 136/50 (BP Location: Right Arm)   Pulse 66   Temp 98.5 F (36.9 C) (Oral)   Resp 14   Ht 5\' 3"  (1.6 m)   Wt 79.8 kg (175 lb 14.4 oz)   SpO2 98%   BMI 31.16 kg/m  Constitutional: She appears well-developedand well-nourished.  HENT: Normocephalicand atraumatic.  Eyes: EOMI. No discharge.  Cardiovascular: RRR. No JVD. Respiratory: Effort normal and breath sounds normal.  GI: Soft. Bowel sounds are normal.  Musculoskeletal: She no edema and no tenderness.  Neurological: She is alertand oriented.  Right facial weakness with dysarthria.  Expressive >>receptive aphasia, improving.  Motor RUE: 1/5 shoulder abduction, 0/5 distally RLE: 3+/5 HF, KE, 2+/5 ADF/PF LUE/LLE: 4/5 throughout Skin: Skin is warmand dry.  Psychiatric: She has a normal mood and affect. Her behavior is normal.    Assessment/Plan: 1. Functional deficits secondary to left MCA distribution infarct which require 3+ hours per day of interdisciplinary therapy in a comprehensive inpatient rehab  setting. Physiatrist is providing close team supervision and 24 hour management of active medical problems listed below. Physiatrist and rehab team continue to assess barriers to discharge/monitor patient progress toward functional and medical goals.  Function:  Bathing Bathing position   Position: Bed (UB bathing seated EOB, perineal area bed level)  Bathing parts Body parts bathed by patient: Chest, Abdomen, Right upper leg, Left upper leg Body parts bathed by helper: Left arm, Front perineal area, Buttocks, Right arm, Right lower leg, Left lower leg, Back  Bathing assist        Upper Body Dressing/Undressing Upper body dressing   What is the patient wearing?: Pull over shirt/dress     Pull over shirt/dress - Perfomed by patient: Thread/unthread right sleeve, Thread/unthread left sleeve, Put head through opening, Pull shirt over trunk Pull over shirt/dress - Perfomed by helper: Thread/unthread right sleeve, Thread/unthread left sleeve, Put head through opening, Pull shirt over trunk        Upper body assist        Lower Body Dressing/Undressing Lower body dressing   What is the patient wearing?: Pants     Pants- Performed by patient: Thread/unthread right pants leg Pants- Performed by helper: Thread/unthread right pants leg, Thread/unthread left pants leg, Pull pants up/down   Non-skid slipper socks- Performed by helper: Don/doff right sock, Don/doff left sock  Lower body assist Assist for lower body dressing: 2 Helpers      Toileting Toileting Toileting activity did not occur: No continent bowel/bladder event        Toileting assist     Transfers Chair/bed transfer Chair/bed transfer activity did not occur: N/A Chair/bed transfer method: Stand pivot Chair/bed transfer assist level: Maximal assist (Pt 25 - 49%/lift and lower) Chair/bed transfer assistive device: Armrests     Locomotion Ambulation     Max distance: 20 Assist level:  Maximal assist (Pt 25 - 49%)   Wheelchair     Max wheelchair distance: 10' Assist Level: Total assistance (Pt < 25%)  Cognition Comprehension Comprehension assist level: Understands basic 75 - 89% of the time/ requires cueing 10 - 24% of the time  Expression Expression assist level: Expresses basic 25 - 49% of the time/requires cueing 50 - 75% of the time. Uses single words/gestures.  Social Interaction Social Interaction assist level: Interacts appropriately 75 - 89% of the time - Needs redirection for appropriate language or to initiate interaction.  Problem Solving Problem solving assist level: Solves basic 25 - 49% of the time - needs direction more than half the time to initiate, plan or complete simple activities  Memory Memory assist level: Recognizes or recalls 25 - 49% of the time/requires cueing 50 - 75% of the time    Medical Problem List and Plan: 1. Right hemiparesis and aphasia  secondary to left MCA distribution infarct  Cont CIR  Bracing ordered 2. DVT Prophylaxis/Anticoagulation: Mechanical: Sequential compression devices, below knee Bilateral lower extremities 3. Chronic back pain/Pain Management: On pamelor for neuropathy and MS contin bid.  4. Mood: LCSW to follow for evaluation and support.  5. Neuropsych: This patient is not capable of making decisions on her own behalf. 6. Skin/Wound Care: routine pressure relief measures.  7. Fluids/Electrolytes/Nutrition: May need IVF at nights if unable to maintain hydration.  8.HTN: Monitor BP bid.   Avapro increased to 112.5 on 5/22  Monitor with increased mobility 9. PAC's: Monitor heart rate bid. Currently in NSR.  10. Diastolic Heart failure: Asymptomatic.  Filed Weights   12/03/16 1630  Weight: 79.8 kg (175 lb 14.4 oz)  11.H/o anemia with multiple transfusion:  Hb 9.6 on 5/21  Cont to monitor 12. Depression: has been managed on Prozac.  13. CKD: Baseline SCr- 1.4 range. Continue to monitor. Check lytes serially  to monitor stability.   Cr 1.09 on 5/21  Cont to monitor 14. Prediabetes: Hgb A1C- 5.8. Educate on appropriate diet as cognition improves.   Monitor with increased mobility  Relatively controlled 5/22 15. Dysphagia: On dysphagia 1, honey liquids by tsp 16. Hypoalbuminemia  Supplement initiated 5/18 17. E. Coli UTI   Cont Bactrim -5/23  LOS (Days) 5 A FACE TO FACE EVALUATION WAS PERFORMED  Ankit Lorie Phenix 12/08/2016 9:25 AM

## 2016-12-08 NOTE — Progress Notes (Signed)
Speech Language Pathology Daily Session Note  Patient Details  Name: DIAMANTINA EDINGER MRN: [id number] Date of Birth: 24-Apr-1941  Today's Date: 12/08/2016 SLP Individual Time: 1300-1400 SLP Individual Time Calculation (min): 60 min  Short Term Goals: Week 1: SLP Short Term Goal 1 (Week 1): Pt will consume therapeutic trials of thin liquids with no overt s/s of aspiration and min verbal cues for use of swallowing precautions prior to upgrade.   SLP Short Term Goal 2 (Week 1): Pt will consume dys 1 textures and honey thick liquids via teaspoon with min assist verbal cues for use of swallowing precautions.   SLP Short Term Goal 2 - Progress (Week 1): Met SLP Short Term Goal 3 (Week 1): Pt will name basic, familiar items with mod assist verbal cues for >75% accuracy.  SLP Short Term Goal 4 (Week 1): Pt will follow 2 step commands during functional tasks with min assist verbal and visual cues.   SLP Short Term Goal 5 (Week 1): Pt will recognize and correct verbal errors during structured tasks with mod assist verbal cues.   SLP Short Term Goal 6 (Week 1): Pt will consume dys 2 textures and nectar-thick liquids via cup with min assist verbal cues for use of swallowing precautions.    Skilled Therapeutic Interventions: Skilled treatment session focused on addressing speech and swallow goals. Upon SLP arrival patient was finishing her lunch with spouse and he was observed to be managing cup for patient due to reports of her going fast and taking large sips.  SLP educated him regarding self-feeding being the safest option, especially considering her motor planning deficits, and demonstrated how to provide full supervision and cue patient for small portions and a slow pace; he verbalized understanding.  SLP also facilitated session by providing education regarding the implementation of the [employer] Protocol; however, observation not completed during the session due to timing of meal and session.   Patient was about 80% accurate with basic yes/no questions about needs and named basic/familiar objects with 50% accuracy which was increased to 90% with sentence completion and phonemic cues for placement.  SLP also educated spouse on effective word finding and multi modal communication strategies.  Continue with current plan of care.    Function:  Eating Eating   Modified Consistency Diet: Yes Eating Assist Level: Supervision or verbal cues;More than reasonable amount of time;Set up assist for   Eating Set Up Assist For: Opening containers       Cognition Comprehension Comprehension assist level: Understands basic 75 - 89% of the time/ requires cueing 10 - 24% of the time  Expression   Expression assist level: Expresses basic 25 - 49% of the time/requires cueing 50 - 75% of the time. Uses single words/gestures.  Social Interaction Social Interaction assist level: Interacts appropriately 75 - 89% of the time - Needs redirection for appropriate language or to initiate interaction.  Problem Solving Problem solving assist level: Solves basic 25 - 49% of the time - needs direction more than half the time to initiate, plan or complete simple activities  Memory Memory assist level: Recognizes or recalls 25 - 49% of the time/requires cueing 50 - 75% of the time    Pain    Therapy/Group: Individual Therapy  Carmelia Roller., [place] [phone]  [place] 12/08/2016, 5:05 PM

## 2016-12-08 NOTE — Progress Notes (Signed)
Physical Therapy Note  Patient Details  Name: Valerie Mcclure MRN: [id number] Date of Birth: [birth year] Today's Date: 12/08/2016    Time: 1130-1200 30 minutes  1:1 No c/o pain.  Pt rec'd lying in bed and excited to get up for therapy.  Seated balance edge of bed with mod A to correct LOB.  Transfers with mod/max A for stand pivot transfers.  W/c mobility with bilat LEs for reciprocal pattern and Rt LE strength and coordination with mod A and manual cues for Rt LE timing.  Kinetron for continued LE strength and coordination with manual cues for Rt LE activation, 4 x 1 minute.  Therapeutic rests with pt naming colored cones with mod phonemic cues.   Jerrold Haskell 12/08/2016, 3:04 PM

## 2016-12-08 NOTE — Progress Notes (Signed)
Occupational Therapy Session Note  Patient Details  Name: Valerie Mcclure MRN: [id number] Date of Birth: 08-14-1940  Today's Date: 12/08/2016 OT Individual Time: 1500-1549 OT Individual Time Calculation (min): 49 min    Short Term Goals: Week 1:  OT Short Term Goal 1 (Week 1): Pt will complete bathing with mod assist at sit > stand level  OT Short Term Goal 2 (Week 1): pt will don shirt with mod assist OT Short Term Goal 3 (Week 1): pt will don pants with mod assist at sit > stand level OT Short Term Goal 4 (Week 1): Pt will complete toilet transfers with mod assist OT Short Term Goal 5 (Week 1): Pt will locate items on Rt to complete self-care tasks with min cues  Skilled Therapeutic Interventions/Progress Updates:    Upon entering the room, pt seated in wheelchair with husband present in room. Pt with no c/o , signs, or symptoms of pain. Husband and pt report extreme fatigue for pt from previous therapy sessions. OT propelled pt to day room via wheelchair with total A for time management. Pt engaged in table top scanning activity. Pt taking 10 minutes to locate 3 card matches from 10 cards total. Pt required max multimodal cues for visual scanning strategies. OT placing R UE over intended card, once match found, and pt sliding card on table towards other matches. Pt requesting to return to room secondary to fatigue. Max stand pivot transfer from wheelchair > bed towards R side. Max A for sit >supine and pt positioned in bed to protect hemiplegic side. Bed alarm activated and call bell within reach upon exiting the room.   Therapy Documentation Precautions:  Precautions Precautions: Fall Restrictions Weight Bearing Restrictions: No General:   Vital Signs: Therapy Vitals Temp: 98.3 F (36.8 C) Pulse Rate: 66 Resp: 14 BP: (!) 137/57 Patient Position (if appropriate): Sitting Oxygen Therapy SpO2: 98 % O2 Device: Not Delivered  See Function Navigator for Current Functional  Status.   Therapy/Group: Individual Therapy  Gypsy Decant 12/08/2016, 4:05 PM

## 2016-12-08 NOTE — Progress Notes (Signed)
Modified Barium Swallow Progress Note  Patient Details  Name: Valerie Mcclure MRN: [id number] Date of Birth: 01/20/1941  Today's Date: 12/08/2016  Modified Barium Swallow completed.  Full report located under Chart Review in the Imaging Section.  Brief recommendations include the following:  Clinical Impression    MBS complete.  Patient with functional improvements as compared to previous objective assessment.  She continues to demonstrate a mild-moderate oropharyngeal dysphagia with motor planning and sensorimotor deficits.  She demonstrates decreased oral containment, which results in loss of boluses to the level of the vallecula or pyriform sinuses depending on the size of the bolus as well as a delay in swallow initiation.  If the penetration is trace-mild it is silent in nature; however, otherwise patient has delayed, subtle sensation.  Aspiration of thin liquids resulted in delayed, but sensed penetration.  Patient self-feeding and pacing allowed for intermittent multiple swallows which effectively cleared penetrates from laryngeal vestibule.  Patient with trace residuals throughout pharynx post swallow.  Recommend initiation of a Dys.2 texture and nectar-thick liquid diet with continued full supervision as well as the water protocol.     Swallow Evaluation Recommendations     Recommend initiation of a Dys.2 texture and nectar-thick liquid diet with continued full supervision     Meds whole in puree, No Straws                               Valerie Mcclure, M.A., CCC-SLP [phone]   Valerie Mcclure 12/08/2016,4:45 PM

## 2016-12-09 ENCOUNTER — Inpatient Hospital Stay (HOSPITAL_COMMUNITY): Payer: Medicare Other | Admitting: *Deleted

## 2016-12-09 ENCOUNTER — Inpatient Hospital Stay (HOSPITAL_COMMUNITY): Payer: Medicare Other | Admitting: Occupational Therapy

## 2016-12-09 ENCOUNTER — Inpatient Hospital Stay (HOSPITAL_COMMUNITY): Payer: Medicare Other | Admitting: Physical Therapy

## 2016-12-09 ENCOUNTER — Inpatient Hospital Stay (HOSPITAL_COMMUNITY): Payer: Medicare Other | Admitting: Speech Pathology

## 2016-12-09 NOTE — Progress Notes (Signed)
Orthopedic Tech Progress Note Patient Details:  Valerie Mcclure 27-Mar-1941 [id number] Brace completed by [facility] Patient ID: Valerie Mcclure, female   DOB: 1940/07/31, 76 y.o.   MRN: [id number]   Braulio Bosch 12/09/2016, 7:27 PM

## 2016-12-09 NOTE — Progress Notes (Signed)
Occupational Therapy Session Note  Patient Details  Name: Valerie Mcclure MRN: [id number] Date of Birth: Aug 18, 1940  Today's Date: 12/09/2016 OT Individual Time: 1430-1500 OT Individual Time Calculation (min): 30 min    Short Term Goals: Week 1:  OT Short Term Goal 1 (Week 1): Pt will complete bathing with mod assist at sit > stand level  OT Short Term Goal 2 (Week 1): pt will don shirt with mod assist OT Short Term Goal 3 (Week 1): pt will don pants with mod assist at sit > stand level OT Short Term Goal 4 (Week 1): Pt will complete toilet transfers with mod assist OT Short Term Goal 5 (Week 1): Pt will locate items on Rt to complete self-care tasks with min cues  Skilled Therapeutic Interventions/Progress Updates:    Pt seen for OT session focusing on functional sitting balance, attention to R, and R UE use. Pt in supine upon arrival, family present, however, excused themselves during tx session to be less of distraction to pt. She transferred to EOB with mod-max A with multimodal cuing for sequencing. Pt sat EOB with overall supervision, occasional cuing required for midline orientation. Addressed R UE mobility, pt has ~90 degrees shoulder flexion, however, is not consistent on her ability to initiate and engage in movements with UE. Focus on reaching tasks, attention to R, and following one step directions. Pt displayed minimal finger movements, no wrist activation noted. Pt required mod-max cuing for attention to task and for instructions to be repeated. Pt initially orientated to month only, however, following re-orientation, pt appeared more confused with decreased ability to answer questions with appropriate subject matter answers. She returned to supine per request at end of session, positioned with pillows for comfort, all needs in reach, and family present. Encouraged pt and family to have her up in chair for dinner to promote upright tolerance and easier swallowing/ digestion  position.   Therapy Documentation Precautions:  Precautions Precautions: Fall Restrictions Weight Bearing Restrictions: No Pain:   No/ denies pain  See Function Navigator for Current Functional Status.   Therapy/Group: Individual Therapy  Lewis, Raylyn Carton C 12/09/2016, 7:18 AM

## 2016-12-09 NOTE — Progress Notes (Signed)
[facility] PHYSICAL MEDICINE & REHABILITATION     PROGRESS NOTE  Subjective/Complaints:  Pt seen laying in bed this AM.  Daughter at bedside.  Pt states she is doing better.   ROS: Appears to deny CP, SOB, N/V/D.   Objective: Vital Signs: Blood pressure (!) 139/50, pulse (!) 59, temperature 97.4 F (36.3 C), temperature source Oral, resp. rate 16, height 5\' 3"  (1.6 m), weight 79.8 kg (175 lb 14.4 oz), SpO2 100 %. Dg Swallowing Func-speech Pathology  Result Date: 12/08/2016 Objective Swallowing Evaluation: Type of Study: MBS-Modified Barium Swallow Study Patient Details Name: LUTHER NEWHOUSE MRN: [id number] Date of Birth: 1941/05/04 Today's Date: 12/08/2016 Time: SLP Start Time (ACUTE ONLY): 0930-SLP Stop Time (ACUTE ONLY): 100 SLP Time Calculation (min) (ACUTE ONLY): 30 min Past Medical History: Past Medical History: Diagnosis Date . Blood transfusion without reported diagnosis  . Chronic low back pain  . Hyperlipidemia  . Osteopenia  . Unspecified essential hypertension  . Unspecified hypothyroidism  Past Surgical History: Past Surgical History: Procedure Laterality Date . ABDOMINAL HYSTERECTOMY  1970 . CHOLECYSTECTOMY  07/2009  Dr. Rise Patience . COLONOSCOPY  2003 . FLEXIBLE SIGMOIDOSCOPY  2010 . HAND SURGERY   . IR ANGIO INTRA EXTRACRAN SEL COM CAROTID INNOMINATE UNI L MOD SED  11/29/2016 . IR ANGIO VERTEBRAL SEL SUBCLAVIAN INNOMINATE UNI R MOD SED  11/29/2016 . IR PERCUTANEOUS ART THROMBECTOMY/INFUSION INTRACRANIAL INC DIAG ANGIO  11/29/2016 . LUMBAR LAMINECTOMY  11/2008  Done by Dr. Patrice Paradise . RADIOLOGY WITH ANESTHESIA N/A 11/29/2016  Procedure: RADIOLOGY WITH ANESTHESIA;  Surgeon: Radiologist, Medication, MD;  Location: [place];  Service: Radiology;  Laterality: N/A; . THYROIDECTOMY   HPI: Ptis a 76 y.o.femalewith history of chronic low back pain, HTN, HLD, hypothyroidism presenting with aphasia and right hemiplegia on awakening. Shedidnot receive IV t-PA due to delay in arrival but was taken to  interventional neuroradiology was she received  TICI3 revascularization of the occluded left MCA with mechanical thrombectomy. Patient with post IR hemorrhage. MRI showed moderate sized L MCA infarct with basal ganglia hematoma with slight intraventricular extension and cytotoxic edema with slight 7 mm of midline shift. She was intubated 5/13-5/14. Patient admitted to IP Rehab and has been participating in therapies with functional improvements noted in swallow function at bedside.  As a result of documented sensory deficits a repeat objective assessment is warranted prior to upgrade.   Assessment / Plan / Recommendation CH[facility] IP CLINICAL IMPRESSIONS 12/08/2016 Clinical Impression --MBS complete.  Patient with functional improvements as compared to previous objective assessment.  She continues to demonstrate a mild-moderate oropharyngeal dysphagia with motor planning and sensorimotor deficits.  She demonstrates decreased oral containment, which results in loss of boluses to the level of the vallecula or pyriform sinuses depending on the size of the bolus as well as a delay in swallow initiation.  If the penetration is trace-mild it is silent in nature; however, otherwise patient has delayed, subtle sensation.  Aspiration of thin liquids resulted in delayed, but sensed penetration.  Patient self-feeding and pacing allowed for intermittent multiple swallows which effectively cleared penetrates from laryngeal vestibule.  Patient with trace residuals throughout pharynx post swallow.  Recommend initiation of a Dys.2 texture and nectar-thick liquid diet with continued full supervision as well as the water protocol.   SLP Visit Diagnosis Dysphagia, oropharyngeal phase (R13.12) Attention and concentration deficit following -- Frontal lobe and executive function deficit following -- Impact on safety and function Mild aspiration risk   CH[facility] IP TREATMENT RECOMMENDATION 12/08/2016 Treatment Recommendations  Other (Comment) Refer to  care plan for details    Prognosis 12/08/2016 Prognosis for Safe Diet Advancement Good Barriers to Reach Goals Language deficits;Severity of deficits Barriers/Prognosis Comment -- CH[facility] IP DIET RECOMMENDATION 12/08/2016 SLP Diet Recommendations Dysphagia 2 (Fine chop) solids;Nectar thick liquid Liquid Administration via Cup;No straw Medication Administration Whole meds with puree Compensations Slow rate;Small sips/bites;Monitor for anterior loss;Multiple dry swallows after each bite/sip;Follow solids with liquid Postural Changes Seated upright at 90 degrees   CHL IP OTHER RECOMMENDATIONS 12/08/2016 Recommended Consults -- Oral Care Recommendations Oral care BID Other Recommendations Order thickener from pharmacy;Prohibited food (jello, ice cream, thin soups);Remove water pitcher   CHL IP FOLLOW UP RECOMMENDATIONS 12/03/2016 Follow up Recommendations Refer to care plan for details    CHL IP FREQUENCY AND DURATION 12/01/2016 Speech Therapy Frequency (ACUTE ONLY) Refer to care plan for details  Treatment Duration       CHL IP ORAL PHASE 12/08/2016 Oral Phase Impaired Oral - Pudding Teaspoon -- Oral - Pudding Cup -- Oral - Honey Teaspoon NT Oral - Honey Cup Premature spillage Oral - Nectar Teaspoon NT Oral - Nectar Cup Premature spillage Oral - Nectar Straw -- Oral - Thin Teaspoon NT Oral - Thin Cup Premature spillage;Decreased bolus cohesion Oral - Thin Straw -- Oral - Puree Premature spillage Oral - Mech Soft Premature spillage;Decreased bolus cohesion;Delayed oral transit;Impaired mastication Oral - Regular -- Oral - Multi-Consistency -- Oral - Pill -- Oral Phase - Comment --  CHL IP PHARYNGEAL PHASE 12/08/2016 Pharyngeal Phase Impaired Pharyngeal- Pudding Teaspoon -- Pharyngeal -- Pharyngeal- Pudding Cup -- Pharyngeal -- Pharyngeal- Honey Teaspoon NT Pharyngeal -- Pharyngeal- Honey Cup Delayed swallow initiation-vallecula Pharyngeal Material does not enter airway Pharyngeal- Nectar Teaspoon NT Pharyngeal -- Pharyngeal-  Nectar Cup Delayed swallow initiation-vallecula;Penetration/Aspiration during swallow;Delayed swallow initiation-pyriform sinuses Pharyngeal Material enters airway, remains ABOVE vocal cords then ejected out Pharyngeal- Nectar Straw -- Pharyngeal -- Pharyngeal- Thin Teaspoon NT Pharyngeal -- Pharyngeal- Thin Cup Delayed swallow initiation-pyriform sinuses;Delayed swallow initiation-vallecula;Penetration/Aspiration during swallow;Trace aspiration Pharyngeal Material enters airway, passes BELOW cords and not ejected out despite cough attempt by patient;Material enters airway, remains ABOVE vocal cords then ejected out;Material enters airway, remains ABOVE vocal cords and not ejected out Pharyngeal- Thin Straw -- Pharyngeal -- Pharyngeal- Puree Delayed swallow initiation-vallecula Pharyngeal -- Pharyngeal- Mechanical Soft Delayed swallow initiation-vallecula Pharyngeal -- Pharyngeal- Regular -- Pharyngeal -- Pharyngeal- Multi-consistency -- Pharyngeal -- Pharyngeal- Pill -- Pharyngeal -- Pharyngeal Comment --  CHL IP CERVICAL ESOPHAGEAL PHASE 12/08/2016 Cervical Esophageal Phase WFL Pudding Teaspoon -- Pudding Cup -- Honey Teaspoon -- Honey Cup -- Nectar Teaspoon -- Nectar Cup -- Nectar Straw -- Thin Teaspoon -- Thin Cup -- Thin Straw -- Puree -- Mechanical Soft -- Regular -- Multi-consistency -- Pill -- Cervical Esophageal Comment -- No flowsheet data found. Gunnar Fusi, M.A., CCC-SLP [phone] [place] 12/08/2016, 10:32 AM               Recent Labs  12/07/16 0642  WBC 9.2  HGB 9.6*  HCT 29.3*  PLT 243    Recent Labs  12/07/16 0642  NA 137  K 4.6  CL 102  GLUCOSE 110*  BUN 24*  CREATININE 1.09*  CALCIUM 8.2*   CBG (last 3)  No results for input(s): GLUCAP in the last 72 hours.  Wt Readings from Last 3 Encounters:  12/03/16 79.8 kg (175 lb 14.4 oz)  11/29/16 79.5 kg (175 lb 4.3 oz)  07/31/16 74.8 kg (165 lb)    Physical Exam:  BP (!) 139/50 (  BP Location: Left Arm)   Pulse (!) 59    Temp 97.4 F (36.3 C) (Oral)   Resp 16   Ht 5\' 3"  (1.6 m)   Wt 79.8 kg (175 lb 14.4 oz)   SpO2 100%   BMI 31.16 kg/m  Constitutional: She appears well-developedand well-nourished.  HENT: Normocephalicand atraumatic.  Eyes: EOMI. No discharge.  Cardiovascular: RRR. No JVD. Respiratory: Effort normal and breath sounds normal.  GI: Soft. Bowel sounds are normal.  Musculoskeletal: She no edema and no tenderness.  Neurological: She is alertand oriented x1.  Right facial weakness with dysarthria.  Expressive >>receptive aphasia, improving.  Motor RUE: 1/5 shoulder abduction, 1/5 distally RLE: 4-/5 HF, KE, 2+/5 ADF/PF LUE/LLE: 4+/5 throughout Skin: Skin is warmand dry.  Psychiatric: She has a normal mood and affect. Her behavior is normal.    Assessment/Plan: 1. Functional deficits secondary to left MCA distribution infarct which require 3+ hours per day of interdisciplinary therapy in a comprehensive inpatient rehab setting. Physiatrist is providing close team supervision and 24 hour management of active medical problems listed below. Physiatrist and rehab team continue to assess barriers to discharge/monitor patient progress toward functional and medical goals.  Function:  Bathing Bathing position   Position: Bed (UB bathing seated EOB, perineal area bed level)  Bathing parts Body parts bathed by patient: Chest, Abdomen, Right upper leg, Left upper leg Body parts bathed by helper: Left arm, Front perineal area, Buttocks, Right arm, Right lower leg, Left lower leg, Back  Bathing assist        Upper Body Dressing/Undressing Upper body dressing   What is the patient wearing?: Pull over shirt/dress     Pull over shirt/dress - Perfomed by patient: Thread/unthread right sleeve, Thread/unthread left sleeve, Put head through opening, Pull shirt over trunk Pull over shirt/dress - Perfomed by helper: Thread/unthread right sleeve, Thread/unthread left sleeve, Put head through  opening, Pull shirt over trunk        Upper body assist        Lower Body Dressing/Undressing Lower body dressing   What is the patient wearing?: Pants     Pants- Performed by patient: Thread/unthread right pants leg Pants- Performed by helper: Thread/unthread right pants leg, Thread/unthread left pants leg, Pull pants up/down   Non-skid slipper socks- Performed by helper: Don/doff right sock, Don/doff left sock                  Lower body assist Assist for lower body dressing: 2 Helpers      Toileting Toileting Toileting activity did not occur: No continent bowel/bladder event   Toileting steps completed by helper: Adjust clothing prior to toileting, Performs perineal hygiene, Adjust clothing after toileting Toileting Assistive Devices: Grab bar or rail  Toileting assist Assist level: Two helpers   Transfers Chair/bed transfer Chair/bed transfer activity did not occur: N/A Chair/bed transfer method: Stand pivot Chair/bed transfer assist level: Maximal assist (Pt 25 - 49%/lift and lower) Chair/bed transfer assistive device: Armrests     Locomotion Ambulation     Max distance: 20 Assist level: Maximal assist (Pt 25 - 49%)   Wheelchair   Type: Manual Max wheelchair distance: 40 Assist Level: Moderate assistance (Pt 50 - 74%)  Cognition Comprehension Comprehension assist level: Understands basic 75 - 89% of the time/ requires cueing 10 - 24% of the time  Expression Expression assist level: Expresses basic 25 - 49% of the time/requires cueing 50 - 75% of the time. Uses single words/gestures.  Social Interaction  Social Interaction assist level: Interacts appropriately 75 - 89% of the time - Needs redirection for appropriate language or to initiate interaction.  Problem Solving Problem solving assist level: Solves basic 25 - 49% of the time - needs direction more than half the time to initiate, plan or complete simple activities  Memory Memory assist level:  Recognizes or recalls 25 - 49% of the time/requires cueing 50 - 75% of the time    Medical Problem List and Plan: 1. Right hemiparesis and aphasia  secondary to left MCA distribution infarct  Cont CIR  Bracing ordered 2. DVT Prophylaxis/Anticoagulation: Mechanical: Sequential compression devices, below knee Bilateral lower extremities 3. Chronic back pain/Pain Management: On pamelor for neuropathy and MS contin bid.  4. Mood: LCSW to follow for evaluation and support.  5. Neuropsych: This patient is not capable of making decisions on her own behalf. 6. Skin/Wound Care: routine pressure relief measures.  7. Fluids/Electrolytes/Nutrition: May need IVF at nights if unable to maintain hydration.  8.HTN: Monitor BP bid.   Avapro increased to 112.5 on 5/22  Monitor with increased mobility  Improving 9. PAC's: Monitor heart rate bid. Currently in NSR.  10. Diastolic Heart failure: Asymptomatic.  Filed Weights   12/03/16 1630  Weight: 79.8 kg (175 lb 14.4 oz)  11.H/o anemia with multiple transfusion:  Hb 9.6 on 5/21  Cont to monitor 12. Depression: has been managed on Prozac.  13. CKD: Baseline SCr- 1.4 range. Continue to monitor. Check lytes serially to monitor stability.   Cr 1.09 on 5/21  Cont to monitor 14. Prediabetes: Hgb A1C- 5.8. Educate on appropriate diet as cognition improves.   Monitor with increased mobility  Relatively controlled 5/23 15. Dysphagia: Advanced to D2 nectar.  16. Hypoalbuminemia  Supplement initiated 5/18 17. E. Coli UTI   Cont Bactrim -5/23  LOS (Days) 6 A FACE TO FACE EVALUATION WAS PERFORMED  Jamell Laymon Lorie Phenix 12/09/2016 8:39 AM

## 2016-12-09 NOTE — Progress Notes (Signed)
Occupational Therapy Session Note  Patient Details  Name: Valerie Mcclure MRN: [id number] Date of Birth: 23-Sep-1940  Today's Date: 12/09/2016 OT Individual Time: 1000-1100 OT Individual Time Calculation (min): 60 min    Short Term Goals: Week 1:  OT Short Term Goal 1 (Week 1): Pt will complete bathing with mod assist at sit > stand level  OT Short Term Goal 2 (Week 1): pt will don shirt with mod assist OT Short Term Goal 3 (Week 1): pt will don pants with mod assist at sit > stand level OT Short Term Goal 4 (Week 1): Pt will complete toilet transfers with mod assist OT Short Term Goal 5 (Week 1): Pt will locate items on Rt to complete self-care tasks with min cues  Skilled Therapeutic Interventions/Progress Updates:    Treatment session with focus on transfers, RUE NMR, and Rt attention.  Pt received upright in w/c already dressed, reporting fatigue from previous sessions.  Completed squat pivot transfer w/c > therapy mat to Lt with mod-max assist, requiring blocking of RLE due to increasing extensor tone.  Engaged in [place] in supine with focus on shoulder flexion and elbow flexion/extension.  Pt able to elicit shoulder flexion against gravity, however utilizing abduction to achieve as well as assist from therapist to minimize effects of gravity.  In sitting, engaged in visual scanning to Rt to locate items to match by color, utilizing hand over hand to move RUE in all planes.  Pt organized days of week and months of year in order, requiring pt to visually san to Rt with min cues when unable to locate items.  Squat pivot back to w/c with max assist and left upright in w/c with half lap tray for increased positioning.  Therapy Documentation Precautions:  Precautions Precautions: Fall Restrictions Weight Bearing Restrictions: No General:   Vital Signs: Therapy Vitals Pulse Rate: 71 BP: 136/60 Pain: Pain Assessment Pain Assessment: No/denies pain Pain Score: 5  Pain Location:  Back Pain Orientation: Mid;Lower Patients Stated Pain Goal: 3 Pain Intervention(s): Medication (See eMAR)  See Function Navigator for Current Functional Status.   Therapy/Group: Individual Therapy  Simonne Come 12/09/2016, 12:19 PM

## 2016-12-09 NOTE — Evaluation (Signed)
Recreational Therapy Assessment and Plan  Patient Details  Name: Valerie Mcclure MRN: [id number] Date of Birth: 08/29/1940 Today's Date: 12/09/2016  Rehab Potential: Good ELOS: 3 weeks   Assessment Problem List:      Patient Active Problem List   Diagnosis Date Noted  . Anemia of chronic disease   . Benign essential HTN   . Dysphagia, post-stroke   . Expressive aphasia   . Anterior cerebral circulation hemorrhagic infarction ([place] [place]) 12/03/2016  . PAC (premature atrial contraction) 12/03/2016  . Acute ischemic left MCA stroke ([place]) 12/03/2016  . Aphasia as late effect of stroke 12/03/2016  . Right hemiparesis ([place])   . Nontraumatic subcortical hemorrhage of left cerebral hemisphere ([place])   . Acute embolic stroke ([place]) 63/78/5885  . Mild aortic regurgitation 08/20/2016  . Bilateral edema of lower extremity 04/07/2016  . Pancreatic duct dilated 01/09/2016  . CKD (chronic kidney disease) 12/25/2015  . Mild tricuspid regurgitation 12/25/2015  . Mild mitral regurgitation 12/25/2015  . Prediabetes 06/10/2015  . Edema 06/04/2015  . Abdominal pain, chronic, right upper quadrant 06/04/2015  . Postmenopausal disorder 04/03/2014  . Chronic low back pain   . PAIN IN THORACIC SPINE 04/18/2010  . Coronary atherosclerosis 01/24/2009  . DEGENERATIVE JOINT DISEASE 01/23/2009  . ARTHRITIS, LEFT KNEE 12/13/2008  . HERNIATED LUMBAR DISC 12/13/2008  . Hypothyroidism 11/21/2007  . Hyperlipidemia 11/09/2007  . ANEMIA 11/09/2007  . Essential hypertension 11/09/2007  . Osteopenia 11/09/2007    Past Medical History:      Past Medical History:  Diagnosis Date  . Blood transfusion without reported diagnosis   . Chronic low back pain   . Hyperlipidemia   . Osteopenia   . Unspecified essential hypertension   . Unspecified hypothyroidism    Past Surgical History:       Past Surgical History:  Procedure Laterality Date  . ABDOMINAL HYSTERECTOMY  1970  .  CHOLECYSTECTOMY  07/2009   Dr. Rise Patience  . COLONOSCOPY  2003  . FLEXIBLE SIGMOIDOSCOPY  2010  . HAND SURGERY    . IR ANGIO INTRA EXTRACRAN SEL COM CAROTID INNOMINATE UNI L MOD SED  11/29/2016  . IR ANGIO VERTEBRAL SEL SUBCLAVIAN INNOMINATE UNI R MOD SED  11/29/2016  . IR PERCUTANEOUS ART THROMBECTOMY/INFUSION INTRACRANIAL INC DIAG ANGIO  11/29/2016  . LUMBAR LAMINECTOMY  11/2008   Done by Dr. Patrice Paradise  . RADIOLOGY WITH ANESTHESIA N/A 11/29/2016   Procedure: RADIOLOGY WITH ANESTHESIA;  Surgeon: Radiologist, Medication, MD;  Location: [facility];  Service: Radiology;  Laterality: N/A;  . THYROIDECTOMY      Assessment & Plan Clinical Impression: Patient is a 76 y.o. year old female with history of chronic abdominal and LBP, CKD, hyperlipidemia who fell out of bed on 5/13 am and was found to have left facial weakness with slurred speech and right sided weakness. CT head done revealing hyperdense L-MCA sign with hypodensity in left temporal area. CTA showed left M1 occlusion due to acute thrombus or embolus with significant penumbra left temporoparietal lobe and irregular non-calcified plaque L-ICA. She underwent cerebral angio with complete revascularization of L-MCA with reperfusion. Follow up CCT showed hemorrhagic transformation of left temporal infarct, large area of hemorrhage and extravasated contrast in left basal ganglia, minimal SDH along tentorium and 6 mm midline shift to right. MRI brain L-MCA infarct with diffusion restriction left temporal lobe, superimposed 5.5 cm intra-axial hemorrhage centered at left basal ganglia with associated left hemisphere edema, effaced left lateral ventricle with trace IVH and occasional small foci of  restriction in left thalamus and anterior left occipital lobe.   She was extubated 5/14 pm and Dr. Acie Fredrickson consulted for input on new onset A fib on telemetry. EKG without evidence of A fib. 2 D echo showed EF 55-60% with mild mitral regurg. He felt that  patient with multifocal PACs and might be having bouts of A fib due to stress v/s sinus arrhthymias. Question event monitor v/s loop to evaluate for arrhthymias--to have 30 day monitor at discharge and loop recorder if monitor unrevealing. BLE ultrasound without DVT--right groin limited due to bandage. FEES done showing silent penetration with liquids--D1, honey by teaspoon recommended due to dysphagia. Dr. Erlinda Hong felt that stroke embolic due to unknown source and to start ASA on 5/24 for secondary stroke prevention. Speech evaluation revealed moderate dysarthria with expressive >receptive aphasia with phonemic paraphasias and anomia. Patient with resultant right sided weakness with right lean, right inattention, motor apraxia and DOE.  Patient transferred to CIR on 12/03/2016 .    Pt presents with decreased activity tolerance, decreased functional mobility, decreased balance, right inattention, decreased attention, decreased awareness, decreased problem solving, decreased safety Limiting pt's independence with leisure/community pursuits.   Leisure History/Participation Premorbid leisure interest/current participation: [employer] store;Community - Travel (Comment) Expression Interests: Music (Comment);Singing (sings solos at [employer]) Other Leisure Interests: Television;Movies;Cooking/Baking;Housework Leisure Participation Style: With Family/Friends Awareness of Community Resources: Good-identify 3 post discharge leisure resources Psychosocial / Spiritual Spiritual Interests: Church Patient agreeable to Pet Therapy: Yes Does patient have pets?: Yes Social interaction - Mood/Behavior: Cooperative Academic librarian Appropriate for Education?: Yes Recreational Therapy Orientation Orientation -Reviewed with patient: Available activity resources Strengths/Weaknesses Patient Strengths/Abilities: Willingness to participate;Active premorbidly Patient weaknesses:  Physical limitations TR Patient demonstrates impairments in the following area(s): Endurance;Motor;Safety  Plan Rec Therapy Plan Is patient appropriate for Therapeutic Recreation?: Yes Rehab Potential: Good Treatment times per week: Min 1 TR session/group during LOS >20 minutes Estimated Length of Stay: 3 weeks TR Treatment/Interventions: Adaptive equipment instruction;1:1 session;Balance/vestibular training;Functional mobility training;Community reintegration;Cognitive remediation/compensation;Group participation (Comment);Patient/family education;Therapeutic activities;Recreation/leisure participation;Provide activity resources in room;Therapeutic exercise;UE/LE Coordination activities;Wheelchair propulsion/positioning Recommendations for other services: Neuropsych  Recommendations for other services: Neuropsych  Discharge Criteria: Patient will be discharged from TR if patient refuses treatment 3 consecutive times without medical reason.  If treatment goals not met, if there is a change in medical status, if patient makes no progress towards goals or if patient is discharged from hospital.  The above assessment, treatment plan, treatment alternatives and goals were discussed and mutually agreed upon: by patient  Poll[place] 12/09/2016, 4:02 PM

## 2016-12-09 NOTE — Progress Notes (Signed)
Speech Language Pathology Daily Session Note  Patient Details  Name: Valerie Mcclure MRN: [id number] Date of Birth: 04/06/1941  Today's Date: 12/09/2016 SLP Individual Time: 1107-1210 SLP Individual Time Calculation (min): 63 min  Short Term Goals: Week 1: SLP Short Term Goal 1 (Week 1): Pt will consume therapeutic trials of thin liquids with no overt s/s of aspiration and min verbal cues for use of swallowing precautions prior to upgrade.   SLP Short Term Goal 2 (Week 1): Pt will consume dys 1 textures and honey thick liquids via teaspoon with min assist verbal cues for use of swallowing precautions.   SLP Short Term Goal 2 - Progress (Week 1): Met SLP Short Term Goal 3 (Week 1): Pt will name basic, familiar items with mod assist verbal cues for >75% accuracy.  SLP Short Term Goal 4 (Week 1): Pt will follow 2 step commands during functional tasks with min assist verbal and visual cues.   SLP Short Term Goal 5 (Week 1): Pt will recognize and correct verbal errors during structured tasks with mod assist verbal cues.   SLP Short Term Goal 6 (Week 1): Pt will consume dys 2 textures and nectar-thick liquids via cup with min assist verbal cues for use of swallowing precautions.    Skilled Therapeutic Interventions:  Pt was seen for skilled ST targeting cognitive-linguistic goals.  Pt followed functional commands during basic sinkside ADLs with supervision verbal cues.  Pt named objects in photographs with mod-max assist semantic, phonemic, and sentence completion cues.  Pt could recognize errors at the word and phrase level but needed mod-max assist verbal and visual cues to correct them.  At the conversational level and during less structured conversational exchanges pt appeared to have less awareness of her verbal errors.  When attempting to expand oral reading to the phrase level, pt needed max to total assist multimodal cues for output and she became frustrated.  SLP provided encouragement and  highlighted pt's progress in therapies thus far.  Therapist also provided skilled education about rationale for increasing task complexity to further challenge pt given her improvement.  Pt declined any POs during today's therapy session.  Pt was returned to room and left in wheelchair with family member present at bedside.  Continue per current plan of care.       Function:  Eating Eating                 Cognition Comprehension Comprehension assist level: Understands basic 75 - 89% of the time/ requires cueing 10 - 24% of the time  Expression   Expression assist level: Expresses basic 25 - 49% of the time/requires cueing 50 - 75% of the time. Uses single words/gestures.  Social Interaction Social Interaction assist level: Interacts appropriately 75 - 89% of the time - Needs redirection for appropriate language or to initiate interaction.  Problem Solving Problem solving assist level: Solves basic 25 - 49% of the time - needs direction more than half the time to initiate, plan or complete simple activities  Memory Memory assist level: Recognizes or recalls 25 - 49% of the time/requires cueing 50 - 75% of the time    Pain Pain Assessment Pain Assessment: No/denies pain   Therapy/Group: Individual Therapy  Simar Pothier, Selinda Orion 12/09/2016, 12:13 PM

## 2016-12-09 NOTE — Progress Notes (Signed)
Physical Therapy Note  Patient Details  Name: Valerie Mcclure MRN: [id number] Date of Birth: 02-03-1941 Today's Date: 12/09/2016    Time: 830-925 55 minutes  1:1 No c/o pain. Pt with improved bed mobility requiring assist only for scooting Rt hip to edge of bed and cues to remember Rt UE during movement.  Stand pivot transfers with mod A.  Gait with RW with Rt hand splint 2 x 20' with mod A for straight lines, max A for turning to sit.  Pt requires manual facilitation for wt shifts and assist to advance Rt LE when fatigued.  Pt with increasing tone in Rt LE into supination, adduction and knee extension.  Sit to stand blocked practice with focus on Rt LE weight bearing and keeping knee flexed.  Standing squat and reach task for continued strengthening and coordination of LE mm. Nu step 2 x 2 minutes for LE strength and coordination with assist needed to keep hips adducted during motion.   Valerie Mcclure 12/09/2016, 9:25 AM

## 2016-12-10 ENCOUNTER — Inpatient Hospital Stay (HOSPITAL_COMMUNITY): Payer: Medicare Other | Admitting: Speech Pathology

## 2016-12-10 ENCOUNTER — Inpatient Hospital Stay (HOSPITAL_COMMUNITY): Payer: Medicare Other | Admitting: Occupational Therapy

## 2016-12-10 DIAGNOSIS — I69351 Hemiplegia and hemiparesis following cerebral infarction affecting right dominant side: Secondary | ICD-10-CM

## 2016-12-10 NOTE — Patient Care Conference (Signed)
Inpatient RehabilitationTeam Conference and Plan of Care Update Date: 12/09/2016   Time: 2:40 PM    Patient Name: Valerie Mcclure      Medical Record Number: [id number]  Date of Birth: 1940-08-18 Sex: Female         Room/Bed: 4W09C/4W09C-01 Payor Info: Payor: [employer] MEDICARE / Plan: [facility] MEDICARE / Product Type: *No Product type* /    Admitting Diagnosis: L MCA Infarct  Admit Date/Time:  12/03/2016  3:58 PM Admission Comments: No comment available   Primary Diagnosis:  Right hemiparesis ([place]) Principal Problem: Right hemiparesis [facility])  Patient Active Problem List   Diagnosis Date Noted  . Spastic hemiplegia of right dominant side as late effect of cerebral infarction ([place])   . E-coli UTI   . Anemia of chronic disease   . Benign essential HTN   . Dysphagia, post-stroke   . Expressive aphasia   . Anterior cerebral circulation hemorrhagic infarction ([place]) 12/03/2016  . PAC (premature atrial contraction) 12/03/2016  . Acute ischemic left MCA stroke ([place]) 12/03/2016  . Aphasia as late effect of stroke 12/03/2016  . Right hemiparesis ([place])   . Nontraumatic subcortical hemorrhage of left cerebral hemisphere ([place])   . Acute embolic stroke ([place]) [id number]/49/6759  . Mild aortic regurgitation 08/20/2016  . Bilateral edema of lower extremity 04/07/2016  . Pancreatic duct dilated 01/09/2016  . CKD (chronic kidney disease) 12/25/2015  . Mild tricuspid regurgitation 12/25/2015  . Mild mitral regurgitation 12/25/2015  . Prediabetes 06/10/2015  . Edema 06/04/2015  . Abdominal pain, chronic, right upper quadrant 06/04/2015  . Postmenopausal disorder 04/03/2014  . Chronic low back pain   . PAIN IN THORACIC SPINE 04/18/2010  . Coronary atherosclerosis 01/24/2009  . DEGENERATIVE JOINT DISEASE 01/23/2009  . ARTHRITIS, LEFT KNEE 12/13/2008  . HERNIATED LUMBAR DISC 12/13/2008  . Hypothyroidism 11/21/2007  . Hyperlipidemia 11/09/2007  . ANEMIA 11/09/2007  . Essential hypertension  11/09/2007  . Osteopenia 11/09/2007    Expected Discharge Date: Expected Discharge Date: 12/24/16  Team Members Present: Physician leading conference: Dr. Delice Lesch Social Worker Present: Lennart Pall, LCSW Nurse Present: Dorien Chihuahua, RN PT Present: Georjean Mode, PT OT Present: Simonne Come, OT SLP Present: Windell Moulding, SLP PPS Coordinator present : Daiva Nakayama, RN, CRRN     Current Status/Progress Goal Weekly Team Focus  Medical     Right hemiparesis and aphasia  secondary to left MCA distribution infarct  Improve mobility, transfers, mutliple medical conditions  See above   Bowel/Bladder   incontinent at times B/B; PVRs q6-8--cath q6; LBM 05/21  decrease in mL retained  offer toileting q shift and prn   Swallow/Nutrition/ Hydration   Dys.2 textures and nectar-thick liuqids with full supervision and Max assist   least restrictive PO Supervision   carryover of safe swallow strategies and trials of advanced textures    ADL's   max assist bathing, total assist UB and LB dressing, max assist squat pivot transfers  Min assist  transfers, sitting balance, RUE NMR, Rt attention   Mobility   max A transfers and gait  min A gait, supervision transfers  balance, NMR, activity tolerance   Communication   Mod-Max A  Min A  self-moniotirng of verbal errors, accuracy with naming objects    Safety/Cognition/ Behavioral Observations  Max-Total A  Min A  increased awareness of deficits and safety awareness   Pain   denies pain  <2  assess for pain q shift and prn   Skin   incision with dermabond  to lower abd/pelvis; bruised R hip  free of infection and breakdown  assess skin q shift and prn for changes    Rehab Goals Patient on target to meet rehab goals: Yes *See Care Plan and progress notes for long and short-term goals.  Barriers to Discharge: HTN, CHF, CKD, dysphagia, UTI, prediabetes, urinary incontinence    Possible Resolutions to Barriers:  Therapies, optimize BP meds,  follow CBGs, abx    Discharge Planning/Teaching Needs:  Home with spouse and other family to provide 24/7 assistance.  Teaching to be done closer to d/c and prn.   Team Discussion:  Adjusting BP meds;  UTI - abx started.  Require occ caths but improving - MD aware.  Currently mod/ max for tfs and mod assist for sitting.  Min assist w/c level goals.  Aphasia, apraxia and dysarthria present - min assist ST goals.  Family now cleared to provide supervision for meals.  Revisions to Treatment Plan:  None   Continued Need for Acute Rehabilitation Level of Care: The patient requires daily medical management by a physician with specialized training in physical medicine and rehabilitation for the following conditions: Daily direction of a multidisciplinary physical rehabilitation program to ensure safe treatment while eliciting the highest outcome that is of practical value to the patient.: Yes Daily medical management of patient stability for increased activity during participation in an intensive rehabilitation regime.: Yes Daily analysis of laboratory values and/or radiology reports with any subsequent need for medication adjustment of medical intervention for : Neurological problems;Diabetes problems;Blood pressure problems;Renal problems;Urological problems  Marjie Chea, [place] 12/10/2016, 3:17 PM

## 2016-12-10 NOTE — Progress Notes (Signed)
Occupational Therapy Session Note  Patient Details  Name: Valerie Mcclure MRN: [id number] Date of Birth: 08-01-1940  Today's Date: 12/10/2016 OT Individual Time: [phone] and 1345-1415 OT Individual Time Calculation (min): 60 min and 30 min   Short Term Goals: Week 1:  OT Short Term Goal 1 (Week 1): Pt will complete bathing with mod assist at sit > stand level  OT Short Term Goal 2 (Week 1): pt will don shirt with mod assist OT Short Term Goal 3 (Week 1): pt will don pants with mod assist at sit > stand level OT Short Term Goal 4 (Week 1): Pt will complete toilet transfers with mod assist OT Short Term Goal 5 (Week 1): Pt will locate items on Rt to complete self-care tasks with min cues  Skilled Therapeutic Interventions/Progress Updates:    1) Treatment session with focus on functional mobility, activity tolerance, and use of RUE during self-care tasks. Pt received upright in bed willing to engage in bathing at shower level.  Michaelyn Barter for transfers due to time constraints and to conserve energy.  Pt completed sit > stand with use of [place] with min assist, tactile cues to increase positioning and awareness of RUE during mobility.  Bathing completed with min guard for sitting balance and hand over hand to attempt to utilize RUE during bathing.  Max assist UB dressing due to impaired sequencing and difficulty threading sleeves.  Total assist LB dressing due to fatigue.  Michaelyn Barter for safety with pt completing sit > stand with min assist while therapist pulled pants over hips.  Returned to room and left upright in w/c awaiting breakfast tray.  Pt's daughter, [name], present and observing throughout session.  2) Treatment session with focus on Rt attention and RUE NMR.  Pt eating lunch upon arrival, requiring cues to scan to Rt to locate items and eat items on Rt.  Max encouragement for intake with minimal intake during session.  Engaged in [place] in sitting with focus on shoulder  flexion and elbow flexion/extension in supported position.  Pt demonstrating inconsistent activation, requiring manual facilitation at elbow for towel glides.  Passed off to PT.  Therapy Documentation Precautions:  Precautions Precautions: Fall Restrictions Weight Bearing Restrictions: No Pain: Pain Assessment Pain Assessment: No/denies pain  See Function Navigator for Current Functional Status.   Therapy/Group: Individual Therapy  Simonne Come 12/10/2016, 3:25 PM

## 2016-12-10 NOTE — Progress Notes (Signed)
Speech Language Pathology Daily Session Note  Patient Details  Name: Valerie Mcclure MRN: [id number] Date of Birth: [birth year]  Today's Date: 12/10/2016 SLP Individual Time: 0935-1030 SLP Individual Time Calculation (min): 55 min  Short Term Goals: Week 1: SLP Short Term Goal 1 (Week 1): Pt will consume therapeutic trials of thin liquids with no overt s/s of aspiration and min verbal cues for use of swallowing precautions prior to upgrade.   SLP Short Term Goal 2 (Week 1): Pt will consume dys 1 textures and honey thick liquids via teaspoon with min assist verbal cues for use of swallowing precautions.   SLP Short Term Goal 2 - Progress (Week 1): Met SLP Short Term Goal 3 (Week 1): Pt will name basic, familiar items with mod assist verbal cues for >75% accuracy.  SLP Short Term Goal 4 (Week 1): Pt will follow 2 step commands during functional tasks with min assist verbal and visual cues.   SLP Short Term Goal 5 (Week 1): Pt will recognize and correct verbal errors during structured tasks with mod assist verbal cues.   SLP Short Term Goal 6 (Week 1): Pt will consume dys 2 textures and nectar-thick liquids via cup with min assist verbal cues for use of swallowing precautions.    Skilled Therapeutic Interventions:  Pt was seen for skilled ST targeting goals for communication and dysphagia.  Pt's daughter was present and reported that pt had been brought an incorrect meal tray and was not offered a replacement.  Daughter also reported ongoing issues with getting correct meals from the kitchen.  As a result, SLP facilitated the session with a meal planning task to aid in functional communication with staff members.  Pt needed max assist verbal cues to accurately name menu items but could indicate preference with yes/no responses or when provided with choice of two for 100% accuracy with supervision.  Pt consumed presentations of her currently prescribed diet with no overt s/s of aspiration with min  verbal and visual cues for use of swallowing precautions.  Prior to breakfast, pt also consumed trials of water per the water protocol with intermittently wet vocal quality and suspected delayed swallow initiation.  Recommend that pt remain on her currently prescribed diet with trials of water per the water protocol to continue working towards diet progression.  Pt was left in wheelchair with daughter at bedside.  Continue per current plan of care.        Function:  Eating Eating   Modified Consistency Diet: Yes Eating Assist Level: Set up assist for;Supervision or verbal cues   Eating Set Up Assist For: Opening containers       Cognition Comprehension Comprehension assist level: Understands basic 50 - 74% of the time/ requires cueing 25 - 49% of the time  Expression   Expression assist level: Expresses basic 25 - 49% of the time/requires cueing 50 - 75% of the time. Uses single words/gestures.  Social Interaction Social Interaction assist level: Interacts appropriately 75 - 89% of the time - Needs redirection for appropriate language or to initiate interaction.  Problem Solving Problem solving assist level: Solves basic 25 - 49% of the time - needs direction more than half the time to initiate, plan or complete simple activities  Memory Memory assist level: Recognizes or recalls 25 - 49% of the time/requires cueing 50 - 75% of the time    Pain Pain Assessment Pain Assessment: No/denies pain  Therapy/Group: Individual Therapy  Orlyn Odonoghue, Selinda Orion 12/10/2016, 12:14 PM

## 2016-12-10 NOTE — Progress Notes (Signed)
[facility] [facility] PHYSICAL MEDICINE & REHABILITATION     PROGRESS NOTE  Subjective/Complaints:  Pt seen laying in bed this AM.  She slept well overnight.  Daughter notes that her brace was not replaced overnight after being taken off.   ROS: Denies CP, SOB, N/V/D.   Objective: Vital Signs: Blood pressure (!) 125/52, pulse 63, temperature 97.7 F (36.5 C), temperature source Oral, resp. rate 18, height 5\' 3"  (1.6 m), weight 79.8 kg (175 lb 14.4 oz), SpO2 100 %. No results found. No results for input(s): WBC, HGB, HCT, PLT in the last 72 hours. No results for input(s): NA, K, CL, GLUCOSE, BUN, CREATININE, CALCIUM in the last 72 hours.  Invalid input(s): CO CBG (last 3)  No results for input(s): GLUCAP in the last 72 hours.  Wt Readings from Last 3 Encounters:  12/03/16 79.8 kg (175 lb 14.4 oz)  11/29/16 79.5 kg (175 lb 4.3 oz)  07/31/16 74.8 kg (165 lb)    Physical Exam:  BP (!) 125/52 (BP Location: Left Arm)   Pulse 63   Temp 97.7 F (36.5 C) (Oral)   Resp 18   Ht 5\' 3"  (1.6 m)   Wt 79.8 kg (175 lb 14.4 oz)   SpO2 100%   BMI 31.16 kg/m  Constitutional: She appears well-developedand well-nourished.  HENT: Normocephalicand atraumatic.  Eyes: EOMI. No discharge.  Cardiovascular: RRR. No JVD. Respiratory: Effort normal and breath sounds normal.  GI: Soft. Bowel sounds are normal.  Musculoskeletal: She no edema and no tenderness.  Neurological: She is alertand oriented x1.  Right facial weakness with dysarthria.  Expressive >>receptive aphasia, continues to improve.  Motor RUE: 1/5 shoulder abduction, 1/5 distally RLE: 4-/5 HF, KE, 2+/5 ADF/PF Increased tone RLE, difficult to assess due to pt resistance LUE/LLE: 4+/5 throughout Skin: Skin is warmand dry.  Psychiatric: She has a normal mood and affect. Her behavior is normal.    Assessment/Plan: 1. Functional deficits secondary to left MCA distribution infarct which require 3+ hours per day of interdisciplinary therapy  in a comprehensive inpatient rehab setting. Physiatrist is providing close team supervision and 24 hour management of active medical problems listed below. Physiatrist and rehab team continue to assess barriers to discharge/monitor patient progress toward functional and medical goals.  Function:  Bathing Bathing position   Position: Bed (UB bathing seated EOB, perineal area bed level)  Bathing parts Body parts bathed by patient: Chest, Abdomen, Right upper leg, Left upper leg Body parts bathed by helper: Left arm, Front perineal area, Buttocks, Right arm, Right lower leg, Left lower leg, Back  Bathing assist        Upper Body Dressing/Undressing Upper body dressing   What is the patient wearing?: Pull over shirt/dress     Pull over shirt/dress - Perfomed by patient: Thread/unthread right sleeve, Thread/unthread left sleeve, Put head through opening, Pull shirt over trunk Pull over shirt/dress - Perfomed by helper: Thread/unthread right sleeve, Thread/unthread left sleeve, Put head through opening, Pull shirt over trunk        Upper body assist        Lower Body Dressing/Undressing Lower body dressing   What is the patient wearing?: Pants     Pants- Performed by patient: Thread/unthread right pants leg Pants- Performed by helper: Thread/unthread right pants leg, Thread/unthread left pants leg, Pull pants up/down   Non-skid slipper socks- Performed by helper: Don/doff right sock, Don/doff left sock  Lower body assist Assist for lower body dressing: 2 Helpers      Toileting Toileting Toileting activity did not occur: No continent bowel/bladder event Toileting steps completed by patient: Adjust clothing prior to toileting Toileting steps completed by helper: Adjust clothing prior to toileting, Performs perineal hygiene, Adjust clothing after toileting Toileting Assistive Devices: Grab bar or rail  Toileting assist Assist level: Two helpers    Transfers Chair/bed transfer Chair/bed transfer activity did not occur: N/A Chair/bed transfer method: Stand pivot Chair/bed transfer assist level: Maximal assist (Pt 25 - 49%/lift and lower) Chair/bed transfer assistive device: Armrests     Locomotion Ambulation     Max distance: 20 Assist level: Moderate assist (Pt 50 - 74%)   Wheelchair   Type: Manual Max wheelchair distance: 40 Assist Level: Moderate assistance (Pt 50 - 74%)  Cognition Comprehension Comprehension assist level: Understands basic 75 - 89% of the time/ requires cueing 10 - 24% of the time  Expression Expression assist level: Expresses basic 25 - 49% of the time/requires cueing 50 - 75% of the time. Uses single words/gestures.  Social Interaction Social Interaction assist level: Interacts appropriately 75 - 89% of the time - Needs redirection for appropriate language or to initiate interaction.  Problem Solving Problem solving assist level: Solves basic 25 - 49% of the time - needs direction more than half the time to initiate, plan or complete simple activities  Memory Memory assist level: Recognizes or recalls 25 - 49% of the time/requires cueing 50 - 75% of the time    Medical Problem List and Plan: 1. Right hemiparesis and aphasia  secondary to left MCA distribution infarct  Cont CIR  Bracing ordered  With emerging spasticity- will consider medications if warranted 2. DVT Prophylaxis/Anticoagulation: Mechanical: Sequential compression devices, below knee Bilateral lower extremities 3. Chronic back pain/Pain Management: On pamelor for neuropathy and MS contin bid.  4. Mood: LCSW to follow for evaluation and support.  5. Neuropsych: This patient is not capable of making decisions on her own behalf. 6. Skin/Wound Care: routine pressure relief measures.  7. Fluids/Electrolytes/Nutrition: May need IVF at nights if unable to maintain hydration.  8.HTN: Monitor BP bid.   Avapro increased to 112.5 on  5/22  Monitor with increased mobility  Controlled 5/24 9. PAC's: Monitor heart rate bid. Currently in NSR.  10. Diastolic Heart failure: Asymptomatic.  Filed Weights   12/03/16 1630  Weight: 79.8 kg (175 lb 14.4 oz)  11.H/o anemia with multiple transfusion:  Hb 9.6 on 5/21  Cont to monitor 12. Depression: has been managed on Prozac.  13. CKD: Baseline SCr- 1.4 range. Continue to monitor. Check lytes serially to monitor stability.   Cr 1.09 on 5/21  Cont to monitor 14. Prediabetes: Hgb A1C- 5.8. Educate on appropriate diet as cognition improves.   Monitor with increased mobility  Relatively controlled 5/24 15. Dysphagia: Advanced to D2 nectar.  16. Hypoalbuminemia  Supplement initiated 5/18 17. E. Coli UTI   Bactrim completed 5/23  LOS (Days) 7 A FACE TO FACE EVALUATION WAS PERFORMED  Kirbi Farrugia Lorie Phenix 12/10/2016 11:23 AM

## 2016-12-10 NOTE — Progress Notes (Signed)
Physical Therapy Session Note  Patient Details  Name: Valerie Mcclure MRN: [id number] Date of Birth: 09/16/1940  Today's Date: 12/10/2016 PT Individual Time: 1415-1450 PT Individual Time Calculation (min): 35 min   Short Term Goals: Week 1:  PT Short Term Goal 1 (Week 1): pt will perform functional transfers with mod A PT Short Term Goal 2 (Week 1): pt will perform gait with mod A x 50' in controlled environment  Skilled Therapeutic Interventions/Progress Updates:   Session focused on neuro re-ed for postural control re-training, reorientation to midline, and weightshifting during bed mobility, sitting EOB, and sit <> stands. Pt initially come to EOB with min assist but mod assist to scoot to EOB with R foot flat on floor. Attempted sit <> stand with max assist but RLE going into extension on several occasions despite attempts to block RLE and then pt with poor postural control losing balance posterior and to the L. While seated EOB, addressed postural control re-training and reorientation to midline with facilitation for weightshift to the R and statically and then dynamically maintaining this position. Progressed to sit <-> squat and then sit <> stand with mod and then min assist - mod assist for equal weightbearing needed. Pt demonstrating improved postural control (initially max assist and then able to correct with supervision).  Repositioned in supine with all needs in reach and husband at bedside.   Therapy Documentation Precautions:  Precautions Precautions: Fall Restrictions Weight Bearing Restrictions: No  Pain: Pain Assessment Pain Assessment: No/denies pain   See Function Navigator for Current Functional Status.   Therapy/Group: Individual Therapy  Canary Brim Ivory Broad, PT, DPT  12/10/2016, 3:02 PM

## 2016-12-10 NOTE — Progress Notes (Signed)
Social Work Patient ID: Valerie Mcclure, female   DOB: [birth year], 76 y.o.   MRN: [id number]   Met yesterday afternoon with pt, spouse and daughter to review team conference.  All aware and agreeable with targeted d/c date of 6/7 and min assist w/c level goals.  They are pleased with progress, however, pt admit frustration with "how slow it is...".  Husband very encouraging to pt and reports he will provide any assist needed, however, he has had a recent MI himself.  Will continue to follow for support and d/c planning.  May benefit from neuropsychology support as well.  Tavaras Goody, LCSW

## 2016-12-11 ENCOUNTER — Inpatient Hospital Stay (HOSPITAL_COMMUNITY): Payer: Medicare Other | Admitting: Physical Therapy

## 2016-12-11 ENCOUNTER — Inpatient Hospital Stay (HOSPITAL_COMMUNITY): Payer: Medicare Other | Admitting: Occupational Therapy

## 2016-12-11 ENCOUNTER — Inpatient Hospital Stay (HOSPITAL_COMMUNITY): Payer: Medicare Other | Admitting: Speech Pathology

## 2016-12-11 MED ORDER — BACLOFEN 5 MG HALF TABLET
5.0000 mg | ORAL_TABLET | Freq: Three times a day (TID) | ORAL | Status: DC
  Administered 2016-12-11 – 2016-12-14 (×9): 5 mg via ORAL
  Filled 2016-12-11 (×9): qty 1

## 2016-12-11 MED ORDER — PANTOPRAZOLE SODIUM 40 MG PO TBEC
40.0000 mg | DELAYED_RELEASE_TABLET | Freq: Every day | ORAL | Status: DC
  Administered 2016-12-11 – 2016-12-19 (×9): 40 mg via ORAL
  Filled 2016-12-11 (×9): qty 1

## 2016-12-11 NOTE — Progress Notes (Signed)
Occupational Therapy Weekly Progress Note  Patient Details  Name: Valerie Mcclure MRN: [id number] Date of Birth: 05-15-1941  Beginning of progress report period: Dec 04, 2016 End of progress report period: Dec 11, 2016  Today's Date: 12/11/2016 OT Individual Time: 0930-1030 OT Individual Time Calculation (min): 60 min    Patient has met 3 of 5 short term goals.  Pt is making steady progress towards goals.  Pt has progressed from requiring +2 for LB dressing and toileting to mod assist for standing balance and total assist to complete all steps.  Pt is demonstrating improved static and dynamic sitting balance during functional tasks with ability to follow one step directions to increase participation in self-care tasks, with minimal carryover from session to session.  Pt's family has been present and supportive throughout sessions.  Patient continues to demonstrate the following deficits: muscle weakness, decreased cardiorespiratoy endurance, impaired timing and sequencing, unbalanced muscle activation and decreased coordination, decreased attention to right, decreased attention, decreased awareness, decreased problem solving, decreased memory and delayed processing and decreased sitting balance, decreased standing balance, decreased postural control, hemiplegia and decreased balance strategies and therefore will continue to benefit from skilled OT intervention to enhance overall performance with BADL and Reduce care partner burden.  Patient progressing toward long term goals..  Continue plan of care.  OT Short Term Goals Week 1:  OT Short Term Goal 1 (Week 1): Pt will complete bathing with mod assist at sit > stand level  OT Short Term Goal 1 - Progress (Week 1): Met OT Short Term Goal 2 (Week 1): pt will don shirt with mod assist OT Short Term Goal 2 - Progress (Week 1): Met OT Short Term Goal 3 (Week 1): pt will don pants with mod assist at sit > stand level OT Short Term Goal 3 -  Progress (Week 1): Progressing toward goal OT Short Term Goal 4 (Week 1): Pt will complete toilet transfers with mod assist OT Short Term Goal 4 - Progress (Week 1): Met OT Short Term Goal 5 (Week 1): Pt will locate items on Rt to complete self-care tasks with min cues OT Short Term Goal 5 - Progress (Week 1): Progressing toward goal Week 2:  OT Short Term Goal 1 (Week 2): Pt will complete bathing with AE PRN at min assist level OT Short Term Goal 2 (Week 2): Pt will complete UB dressing with min assist  OT Short Term Goal 3 (Week 2): pt will don pants with mod assist at sit > stand level OT Short Term Goal 4 (Week 2): Pt will locate items on Rt to complete self-care tasks with min cues  Skilled Therapeutic Interventions/Progress Updates:    Treatment session with focus on ADL retraining with dressing and functional transfers.  Pt received in bed already dressed by daughter.  Educated on goals of OT and plan to complete weekly note therefore need to complete dressing during treatment session.  Educated pt and pt's daughter on hemi-dressing technique with pt able to complete each step, however requiring mod assist for completion of each step.  Total assist for LB dressing with mod assist to maintain sitting balance.  Pt demonstrating increased difficulty with sequencing and problem solving through errors with LB dressing. Mod assist sit > stand with blocking RLE due to increased tone to complete LB dressing. Completed stand pivot transfer mod assist to w/c.  Engaged in discussion regarding OT goals and progress towards goals as well as typical recovery process and prognosis.  Pt's  daughter asking questions regarding pt's safety and awareness especially at night time.  Answered to pt and daughter's satisfaction.  Therapy Documentation Precautions:  Precautions Precautions: Fall Restrictions Weight Bearing Restrictions: No General:   Vital Signs: Therapy Vitals Temp: 98 F (36.7 C) Temp Source:  Oral Pulse Rate: 60 Resp: 18 BP: (!) 101/45 Patient Position (if appropriate): Lying Oxygen Therapy SpO2: 98 % O2 Device: Not Delivered Pain:   Pt with no c/o pain  See Function Navigator for Current Functional Status.   Therapy/Group: Individual Therapy  Simonne Come 12/11/2016, 7:28 AM

## 2016-12-11 NOTE — Progress Notes (Signed)
Speech Language Pathology Weekly Progress and Session Note  Patient Details  Name: Valerie Mcclure MRN: [id number] Date of Birth: [birth year]  Beginning of progress report period: Dec 04, 2016   End of progress report period: Dec 11, 2016    Today's Date: 12/11/2016 SLP Individual Time: 1300-1400 SLP Individual Time Calculation (min): 60 min  Short Term Goals: Week 1: SLP Short Term Goal 1 (Week 1): Pt will consume therapeutic trials of thin liquids with no overt s/s of aspiration and min verbal cues for use of swallowing precautions prior to upgrade.   SLP Short Term Goal 1 - Progress (Week 1): Met SLP Short Term Goal 2 (Week 1): Pt will consume dys 1 textures and honey thick liquids via teaspoon with min assist verbal cues for use of swallowing precautions.   SLP Short Term Goal 2 - Progress (Week 1): Met SLP Short Term Goal 3 (Week 1): Pt will name basic, familiar items with mod assist verbal cues for >75% accuracy.  SLP Short Term Goal 3 - Progress (Week 1): Met SLP Short Term Goal 4 (Week 1): Pt will follow 2 step commands during functional tasks with min assist verbal and visual cues.   SLP Short Term Goal 4 - Progress (Week 1): Met SLP Short Term Goal 5 (Week 1): Pt will recognize and correct verbal errors during structured tasks with mod assist verbal cues.   SLP Short Term Goal 5 - Progress (Week 1): Progressing toward goal SLP Short Term Goal 6 (Week 1): Pt will consume dys 2 textures and nectar-thick liquids via cup with min assist verbal cues for use of swallowing precautions.   SLP Short Term Goal 6 - Progress (Week 1): Met    New Short Term Goals: Week 2: SLP Short Term Goal 1 (Week 2): Pt will consume therapeutic trials of thin liquids with no overt s/s of aspiration and supervision cues for use of swallowing precautions prior to upgrade.   SLP Short Term Goal 2 (Week 2): Pt will name basic, familiar items with min assist verbal cues for >75% accuracy.  SLP Short Term  Goal 3 (Week 2): Pt will follow abstract commands during functional tasks with min assist verbal and visual cues.   SLP Short Term Goal 4 (Week 2): Pt will recognize and correct verbal errors during structured tasks with mod assist verbal cues.   SLP Short Term Goal 5 (Week 2): Pt will consume dys 2 textures and nectar-thick liquids via cup with supervision cues for use of swallowing precautions.    Weekly Progress Updates:  Pt has made functional gains this reporting period and has met 5 out of 6 short term goals.  Pt's diet has been upgraded to dys 2 textures and nectar thick liquids per MBS and pt is utilizing swallowing precautions with min verbal cues to maximize safety with PO intake.  Pt is currently a mod-max assist for functional communication due to global aphasia and apraxia.  While pt is demonstrating improving awareness of her verbal deficits, she still needs heavy assist to correct errors which is increasing her frustration during communication attempts.  As a result, pt would continue to benefit from skilled ST while inpatient in order to maximize functional independence and reduce burden of care prior to discharge.  Pt and family education is ongoing.       Intensity: Minumum of 1-2 x/day, 30 to 90 minutes Frequency: 3 to 5 out of 7 days Duration/Length of Stay: 21-24 days  Treatment/Interventions: Cognitive remediation/compensation;Cueing  hierarchy;Dysphagia/aspiration precaution training;Environmental controls;Functional tasks;Internal/external aids;Patient/family education;Speech/Language facilitation   Daily Session  Skilled Therapeutic Interventions: Pt was seen for skilled ST targeting cognitive-linguistic goals.  Pt had already eaten lunch upon therapist's arrival and declined trials of water today; therefore, dysphagia goals were not addressed.  Pt was able to name basic familiar items with mod assist multimodal cues for accurate production over multiple consecutive attempts  (pt often has inconsistent production of words when naming objects, so multiple attempts were utilized to address consistency).   Pt was also able to produce functional phrases with mod-max assist multimodal cues to recognize and correct perseverative errors when switching between phrases.   Pt was then able to match functional phrases to a corresponding situational photograph with max assist multimodal cues.  Pt was returned to room and transferred back to bed with assistance from nursing.  Pt was left in bed with bed alarm set and call bell within reach.  Goals updated on this date to reflect current progress and plan of care.     Function:   Eating Eating                 Cognition Comprehension Comprehension assist level: Understands basic 75 - 89% of the time/ requires cueing 10 - 24% of the time  Expression   Expression assist level: Expresses basic 25 - 49% of the time/requires cueing 50 - 75% of the time. Uses single words/gestures.  Social Interaction Social Interaction assist level: Interacts appropriately 75 - 89% of the time - Needs redirection for appropriate language or to initiate interaction.  Problem Solving Problem solving assist level: Solves basic 25 - 49% of the time - needs direction more than half the time to initiate, plan or complete simple activities  Memory Memory assist level: Recognizes or recalls 25 - 49% of the time/requires cueing 50 - 75% of the time   General    Pain Pain Assessment Pain Assessment: No/denies pain  Therapy/Group: Individual Therapy  Zen Cedillos, Selinda Orion 12/11/2016, 1:47 PM

## 2016-12-11 NOTE — Progress Notes (Signed)
Physical Therapy Weekly Progress Note  Patient Details  Name: Valerie Mcclure MRN: [id number] Date of Birth: 12/17/1940  Beginning of progress report period: Dec 04, 2016 End of progress report period: Dec 11, 2016   Patient has met 0 of 2 short term goals.  Pt making slow but steady progress towards goals. Pt with increasing tone in Rt LE, continues with impaired posture, strength, coordination and awareness and therefore will continue to benefit from skilled PT intervention to increase functional independence with mobility.  Patient progressing toward long term goals..  Continue plan of care.  PT Short Term Goals Week 1:  PT Short Term Goal 1 (Week 1): pt will perform functional transfers with mod A PT Short Term Goal 1 - Progress (Week 1): Progressing toward goal PT Short Term Goal 2 (Week 1): pt will perform gait with mod A x 50' in controlled environment PT Short Term Goal 2 - Progress (Week 1): Progressing toward goal Week 2:  PT Short Term Goal 1 (Week 2): Pt will consistently perform functional transfers at mod A level PT Short Term Goal 2 (Week 2): Pt will perform gait 50' with mod A in controlled environment  Skilled Therapeutic Interventions/Progress Updates:  Ambulation/gait training;Cognitive remediation/compensation;DME/adaptive equipment instruction;Discharge planning;Functional mobility training;Pain management;Splinting/orthotics;Therapeutic Activities;UE/LE Strength taining/ROM;Wheelchair propulsion/positioning;Therapeutic Exercise;UE/LE Coordination activities;Stair training;Patient/family education;Neuromuscular re-education;Functional electrical stimulation;Community reintegration;Balance/vestibular training     See Function Navigator for Current Functional Status.  DONAWERTH,KAREN 12/11/2016, 8:21 AM  

## 2016-12-11 NOTE — Progress Notes (Signed)
[facility] PHYSICAL MEDICINE & REHABILITATION     PROGRESS NOTE  Subjective/Complaints:  Pt seen laying in bed this AM.  She slept well overnight.  Daughter at bedside, notes pt is making progress.   ROS: Denies CP, SOB, N/V/D.   Objective: Vital Signs: Blood pressure (!) 101/37, pulse 61, temperature 98 F (36.7 C), temperature source Oral, resp. rate 18, height 5\' 3"  (1.6 m), weight 79.8 kg (175 lb 14.4 oz), SpO2 98 %. No results found. No results for input(s): WBC, HGB, HCT, PLT in the last 72 hours. No results for input(s): NA, K, CL, GLUCOSE, BUN, CREATININE, CALCIUM in the last 72 hours.  Invalid input(s): CO CBG (last 3)  No results for input(s): GLUCAP in the last 72 hours.  Wt Readings from Last 3 Encounters:  12/03/16 79.8 kg (175 lb 14.4 oz)  11/29/16 79.5 kg (175 lb 4.3 oz)  07/31/16 74.8 kg (165 lb)    Physical Exam:  BP (!) 101/37 (BP Location: Left Arm)   Pulse 61   Temp 98 F (36.7 C) (Oral)   Resp 18   Ht 5\' 3"  (1.6 m)   Wt 79.8 kg (175 lb 14.4 oz)   SpO2 98%   BMI 31.16 kg/m  Constitutional: She appears well-developedand well-nourished.  HENT: Normocephalicand atraumatic.  Eyes: EOMI. No discharge.  Cardiovascular: RRR. No JVD. Respiratory: Effort normal and breath sounds normal.  GI: Soft. Bowel sounds are normal.  Musculoskeletal: She no edema and no tenderness.  Neurological: She is alertand oriented x1.  Right facial weakness with dysarthria.  Expressive >>receptive aphasia, continues to improve.  Motor RUE: 1/5 shoulder abduction, 1/5 distally RLE: 4-/5 HF, KE, 2+/5 ADF/PF Increased tone RLE, difficult to assess due to pt resistance LUE/LLE: 4+/5 throughout Skin: Skin is warmand dry.  Psychiatric: She has a normal mood and affect. Her behavior is normal.    Assessment/Plan: 1. Functional deficits secondary to left MCA distribution infarct which require 3+ hours per day of interdisciplinary therapy in a comprehensive inpatient rehab  setting. Physiatrist is providing close team supervision and 24 hour management of active medical problems listed below. Physiatrist and rehab team continue to assess barriers to discharge/monitor patient progress toward functional and medical goals.  Function:  Bathing Bathing position   Position: Shower  Bathing parts Body parts bathed by patient: Chest, Abdomen, Front perineal area, Right upper leg, Left upper leg Body parts bathed by helper: Right arm, Buttocks, Right lower leg, Left lower leg, Back, Left arm  Bathing assist Assist Level:  (Mod assist)      Upper Body Dressing/Undressing Upper body dressing   What is the patient wearing?: Pull over shirt/dress     Pull over shirt/dress - Perfomed by patient: Put head through opening Pull over shirt/dress - Perfomed by helper: Thread/unthread left sleeve, Pull shirt over trunk, Thread/unthread right sleeve        Upper body assist Assist Level:  (Max assist)      Lower Body Dressing/Undressing Lower body dressing   What is the patient wearing?: Pants, Non-skid slipper socks     Pants- Performed by patient: Thread/unthread right pants leg Pants- Performed by helper: Thread/unthread right pants leg, Thread/unthread left pants leg, Pull pants up/down   Non-skid slipper socks- Performed by helper: Don/doff right sock, Don/doff left sock                  Lower body assist Assist for lower body dressing:  (Total)      Toileting  Toileting Toileting activity did not occur: No continent bowel/bladder event Toileting steps completed by patient: Adjust clothing prior to toileting Toileting steps completed by helper: Adjust clothing prior to toileting, Performs perineal hygiene, Adjust clothing after toileting Toileting Assistive Devices: Grab bar or rail  Toileting assist Assist level: Two helpers   Transfers Chair/bed transfer Chair/bed transfer activity did not occur: N/A Chair/bed transfer method: Stand  pivot Chair/bed transfer assist level: Maximal assist (Pt 25 - 49%/lift and lower) Chair/bed transfer assistive device: Armrests     Locomotion Ambulation     Max distance: 20 Assist level: Moderate assist (Pt 50 - 74%)   Wheelchair   Type: Manual Max wheelchair distance: 40 Assist Level: Moderate assistance (Pt 50 - 74%)  Cognition Comprehension Comprehension assist level: Understands basic 50 - 74% of the time/ requires cueing 25 - 49% of the time  Expression Expression assist level: Expresses basic 25 - 49% of the time/requires cueing 50 - 75% of the time. Uses single words/gestures.  Social Interaction Social Interaction assist level: Interacts appropriately 75 - 89% of the time - Needs redirection for appropriate language or to initiate interaction.  Problem Solving Problem solving assist level: Solves basic 25 - 49% of the time - needs direction more than half the time to initiate, plan or complete simple activities  Memory Memory assist level: Recognizes or recalls 25 - 49% of the time/requires cueing 50 - 75% of the time    Medical Problem List and Plan: 1. Right hemiparesis and aphasia  secondary to left MCA distribution infarct  Cont CIR  Bracing ordered  With spasticity- trial baclofen 5 TID 2. DVT Prophylaxis/Anticoagulation: Mechanical: Sequential compression devices, below knee Bilateral lower extremities 3. Chronic back pain/Pain Management: On pamelor for neuropathy and MS contin bid.  4. Mood: LCSW to follow for evaluation and support.  5. Neuropsych: This patient is not capable of making decisions on her own behalf. 6. Skin/Wound Care: routine pressure relief measures.  7. Fluids/Electrolytes/Nutrition: May need IVF at nights if unable to maintain hydration.  8.HTN: Monitor BP bid.   Avapro increased to 112.5 on 5/22  Monitor with increased mobility  Controlled 5/25 9. PAC's: Monitor heart rate bid. Currently in NSR.  10. Diastolic Heart failure:  Asymptomatic.  Filed Weights   12/03/16 1630  Weight: 79.8 kg (175 lb 14.4 oz)  11.H/o anemia with multiple transfusion:  Hb 9.6 on 5/21  Labs ordered for Monday  Cont to monitor 12. Depression: has been managed on Prozac.  13. CKD: Baseline SCr- 1.4 range. Continue to monitor. Check lytes serially to monitor stability.   Cr 1.09 on 5/21  Labs ordered for Monday  Cont to monitor 14. Prediabetes: Hgb A1C- 5.8. Educate on appropriate diet as cognition improves.   Monitor with increased mobility  Relatively controlled 5/25 15. Dysphagia: Advanced to D2 nectar.  16. Hypoalbuminemia  Supplement initiated 5/18 17. E. Coli UTI   Bactrim completed 5/23  LOS (Days) 8 A FACE TO FACE EVALUATION WAS PERFORMED  Genny Caulder Lorie Phenix 12/11/2016 9:10 AM

## 2016-12-11 NOTE — Progress Notes (Signed)
Physical Therapy Note  Patient Details  Name: ABBY TUCHOLSKI MRN: [id number] Date of Birth: 1941/05/18 Today's Date: 12/11/2016    Time: 1030-1130 60 minutes  1:1 No c/o pain.  Gait training without AD 30'x 2 with mod A, continued increase in Rt LE tone into adduction and ankle supination, manual facilitation at hips for wt shifts.  Gait with RW with handsplint 2 x 30' with mod A to control RW and assist with wt shifts and Rt LE management.  Sit to stands and reaching with focus on pelvis alignment, blocking to prevent Rt knee extension.  Pt continues with Rt knee and hip extension, Rt pelvis retroversion during stance.  Stair negotiation x 8 steps with 1 handrail with max A for safety and support.  Supine NMR with pt with much difficulty with Rt hip and knee flexion patterns despite tactile and verbal cues.   Kemari Mares 12/11/2016, 11:28 AM

## 2016-12-12 NOTE — Progress Notes (Signed)
[facility] PHYSICAL MEDICINE & REHABILITATION     PROGRESS NOTE  Subjective/Complaints:  Pt seen with dtr in room. She feels well, no complatins.  She admits to slurred speech    Objective: Vital Signs: Blood pressure (!) 120/56, pulse 61, temperature 97.5 F (36.4 C), temperature source Oral, resp. rate 18, height 5\' 3"  (1.6 m), weight 175 lb 14.4 oz (79.8 kg), SpO2 98 %. No results found.  Physical Exam:  BP (!) 120/56   Pulse 61   Temp 97.5 F (36.4 C) (Oral)   Resp 18   Ht 5\' 3"  (1.6 m)   Wt 175 lb 14.4 oz (79.8 kg)   SpO2 98%   BMI 31.16 kg/m   Well-developed well-nourished female in no acute distress. HEENT exam atraumatic, left side of mouth drooping. , extraocular muscles are intact. Neck is supple. No jugular venous distention no thyromegaly. Chest clear to auscultation without increased work of breathing. Cardiac exam S1 and S2 are regular. Abdominal exam active bowel sounds, soft, nontender. Extremities no edema. Neurologic exam she is alert with motor deficits affecting left side (weak arm, leg) Assessment/Plan: 1. Functional deficits secondary to left MCA distribution infarct   Medical Problem List and Plan: 1. Right hemiparesis and aphasia  secondary to left MCA distribution infarct  Cont CIR  Bracing ordered  With spasticity- trial baclofen 5 TID 2. DVT Prophylaxis/Anticoagulation: Mechanical: Sequential compression devices, below knee Bilateral lower extremities 3. Chronic back pain/Pain Management: improved 4. Mood: LCSW to follow for evaluation and support.  5. Neuropsych: This patient is not capable of making decisions on her own behalf. 6. Skin/Wound Care: routine pressure relief measures.  7. Fluids/Electrolytes/Nutrition:  Basic Metabolic Panel:    Component Value Date/Time   NA 137 12/07/2016 0642   K 4.6 12/07/2016 0642   CL 102 12/07/2016 0642   CO2 27 12/07/2016 0642   BUN 24 (H) 12/07/2016 0642   CREATININE 1.09 (H) 12/07/2016 0642   GLUCOSE 110 (H) 12/07/2016 0642   CALCIUM 8.2 (L) 12/07/2016 0642    8.HTN: Monitor BP bid.   Adequate control101/37-130/55 9. PAC's: Monitor heart rate bid. Currently in NSR.  10. Diastolic Heart failure: Asymptomatic.  Filed Weights   12/03/16 1630  Weight: 175 lb 14.4 oz (79.8 kg)  11.H/o anemia with multiple transfusion:  Will f/u in 2 days Lab Results  Component Value Date   HGB 9.6 (L) 12/07/2016   12. Depression: continue prozac 13. CKD: Baseline SCr- 1.4 range. See last CRT above 14. Prediabetes:  Lab Results  Component Value Date   HGBA1C 5.8 (H) 11/30/2016    15. Dysphagia: Advanced to D2 nectar.  16. Hypoalbuminemia  Supplement initiated 5/18 17. E. Coli UTI   Completed septra 5/22  LOS (Days) 9 A FACE TO FACE EVALUATION WAS PERFORMED  Malcom Selmer H Lenvil Swaim 12/12/2016 9:09 AM

## 2016-12-13 ENCOUNTER — Inpatient Hospital Stay (HOSPITAL_COMMUNITY): Payer: Medicare Other | Admitting: Physical Therapy

## 2016-12-13 ENCOUNTER — Inpatient Hospital Stay (HOSPITAL_COMMUNITY): Payer: Medicare Other | Admitting: Occupational Therapy

## 2016-12-13 ENCOUNTER — Other Ambulatory Visit: Payer: Self-pay | Admitting: Internal Medicine

## 2016-12-13 NOTE — Plan of Care (Signed)
Problem: RH BLADDER ELIMINATION Goal: RH STG MANAGE BLADDER WITH ASSISTANCE STG Manage Bladder With min Assistance   Outcome: Progressing In and out catheterized with 400 ml of clear yellow urine, patient tolerated well  Problem: RH SKIN INTEGRITY Goal: RH STG SKIN FREE OF INFECTION/BREAKDOWN Patient will have no skin breakdown or infection while on rehab with min assist from staff.  Outcome: Progressing No skin issues noted  Problem: RH SAFETY Goal: RH STG ADHERE TO SAFETY PRECAUTIONS W/ASSISTANCE/DEVICE STG Adhere to Safety Precautions With mod Assistance and appropriate assistive Device.   Outcome: Progressing No safety issues noted

## 2016-12-13 NOTE — Progress Notes (Signed)
Occupational Therapy Session Note  Patient Details  Name: Valerie Mcclure MRN: [id number] Date of Birth: [birth year]  Today's Date: 12/13/2016 OT Individual Time: 1030-1115 and 1330-1400 OT Individual Time Calculation (min): 45 min and 30 min   Short Term Goals: Week 2:  OT Short Term Goal 1 (Week 2): Pt will complete bathing with AE PRN at min assist level OT Short Term Goal 2 (Week 2): Pt will complete UB dressing with min assist  OT Short Term Goal 3 (Week 2): pt will don pants with mod assist at sit > stand level OT Short Term Goal 4 (Week 2): Pt will locate items on Rt to complete self-care tasks with min cues  Skilled Therapeutic Interventions/Progress Updates:    1) Treatment session with focus on Rt attention and standing balance.  Pt received upright in w/c already dressed.  Engaged in sit > stand in therapy gym with blocking at RLE to promote stability.  Engaged in card matching activity on vertical surface incorporating scanning to Rt.  Pt required increased cues to locate specific numbered cards in Rt visual field and match to vertical "card board".  Pt with lean to Rt during standing task, requiring increased tactile cues and manual facilitation at hips to promote increased midline posture.  WB through RUE in standing when RUE not in use.  Stand pivot transfer back to w/c to Lt min assist.  Left upright in w/c with daughter present.  2) Treatment session with focus on Rt attention and RUE NMR.  Stand pivot transfers Rt and Lt with focus on anterior weight shift and maintaining positioning of RLE during transitional movements due to extensor tone.  Engaged in Connect 4 activity in sitting with focus on visual scanning to Rt to locate pieces and follow instructions of game with mod cues.  Utilized RUE as gross assist to slide pieces closer to pick up with Lt hand.  Left up in w/c with husband present and all needs in reach.  Therapy Documentation Precautions:   Precautions Precautions: Fall Restrictions Weight Bearing Restrictions: No General:   Vital Signs: Therapy Vitals BP: (!) 131/53 Pain:    See Function Navigator for Current Functional Status.   Therapy/Group: Individual Therapy  Simonne Come 12/13/2016, 12:59 PM

## 2016-12-13 NOTE — Progress Notes (Signed)
[facility] PHYSICAL MEDICINE & REHABILITATION     PROGRESS NOTE  Subjective/Complaints:  dtr in room. Patient feels well, no complaints.  Thinks that she is improving with therapies.   Objective: Physical Exam:  BP (!) 118/42 (BP Location: Right Arm)   Pulse 60   Temp 98.1 F (36.7 C) (Oral)   Resp 17   Ht 5\' 3"  (1.6 m)   Wt 175 lb 14.4 oz (79.8 kg)   SpO2 98%   BMI 31.16 kg/m    Well-developed well-nourished female in no acute distress. HEENT exam atraumatic, normocephalic, extraocular muscles are intact. Left facial droop Neck is supple. No jugular venous distention no thyromegaly. Chest clear to auscultation without increased work of breathing. Cardiac exam S1 and S2 are regular. Abdominal exam active bowel sounds, soft, nontender. Extremities no edema. Neurologic exam she is alert. Neurologic exam she is alert with motor deficits affecting left side (weak arm, leg) Assessment/Plan: 1. Functional deficits secondary to left MCA distribution infarct   Medical Problem List and Plan: 1. Right hemiparesis and aphasia  secondary to left MCA distribution infarct  Cont CIR   2. DVT Prophylaxis/Anticoagulation: Mechanical: Sequential compression devices, below knee Bilateral lower extremities 3. Chronic back pain/Pain Management: improved 4. Mood: LCSW to follow for evaluation and support.  5. Neuropsych: This patient is not capable of making decisions on her own behalf. 6. Skin/Wound Care: routine pressure relief measures.  7. Fluids/Electrolytes/Nutrition:  Basic Metabolic Panel: Basic Metabolic Panel:    Component Value Date/Time   NA 137 12/07/2016 0642   K 4.6 12/07/2016 0642   CL 102 12/07/2016 0642   CO2 27 12/07/2016 0642   BUN 24 (H) 12/07/2016 0642   CREATININE 1.09 (H) 12/07/2016 0642   GLUCOSE 110 (H) 12/07/2016 0642   CALCIUM 8.2 (L) 12/07/2016 0642    8.HTN: Monitor BP bid.   Adequate control110/40-120/76 9. PAC's: Monitor heart rate bid. Currently in NSR.   10. Diastolic Heart failure: Asymptomatic.  Filed Weights   12/03/16 1630  Weight: 175 lb 14.4 oz (79.8 kg)  11.H/o anemia with multiple transfusion: Lab Results  Component Value Date   HGB 9.6 (L) 12/07/2016    .12. Depression: continue prozac 13. CKD: Baseline SCr- 1.4 range. crt on 5/21 is ok 14. Prediabetes:  Lab Results  Component Value Date   HGBA1C 5.8 (H) 11/30/2016    15. Dysphagia: d2 nectar diet.  16. Hypoalbuminemia  Supplement initiated 5/18 17. E. Coli UTI   Completed septra 5/22  LOS (Days) 10 A FACE TO FACE EVALUATION WAS PERFORMED  Alika Saladin H Haley Fuerstenberg 12/13/2016 8:01 AM

## 2016-12-13 NOTE — Progress Notes (Signed)
Physical Therapy Session Note  Patient Details  Name: Valerie Mcclure MRN: [id number] Date of Birth: 03-02-1941  Today's Date: 12/13/2016 PT Individual Time: 0902-1000 and 1434-1530 PT Individual Time Calculation (min): 58 min and 56 min  Short Term Goals: Week 2:  PT Short Term Goal 1 (Week 2): Pt will consistently perform functional transfers at mod A level PT Short Term Goal 2 (Week 2): Pt will perform gait 42' with mod A in controlled environment  Skilled Therapeutic Interventions/Progress Updates:  Treatment 1: Pt received in room & agreeable to tx. Session focused on R NMR, strengthening, transfers, and gait. Pt completed supine>sitting EOB with min assist to transfer RLE to EOB; pt utilized bed rails & HOB elevated. Pt completed stand pivot/squat pivot transfers throughout session (bed>w/c, w/c<>kinetron) with mod assist with therapist blocking RLE, providing multimodal cuing for sequencing & hand placement. Pt requires assistance shifting weight to complete transfer. Pt utilized cybex kinetron in sitting up to 50 cm/sec with BLE with task focusing on LE strengthening & R NMR. Pt completed 4 trials x 1 minute each with rest breaks in between. Gait training x 40 ft + 40 ft with RW & R hand orthosis with therapist providing mod assist for balance and weight shifting L/R. Pt also requires cuing for increased step length RLE with fair demo. Pt with intermittent R knee buckling and appears to ambulate with RLE slightly inverted. Pt requires max assist with max verbal cuing for sequencing to complete turns. Pt also requires cuing for R attention & obstacle avoidance during gait. At end of session pt left sitting in w/c in room with QRB donned, all needs within reach, & daughter present to supervise.   Treatment 2: Pt received in room & agreeable to tx. Session focused on transfers, stair negotiation for NMR, and R attention. Pt utilized dynavision from w/c level with min cuing initially for  attention to right. Pt with improving attention to R as task progressed, but still with longer reaction time to R as compared to L. Pt negotiated stairs (6" + 3") with L rail and max assist; pt requires max multimodal cuing for compensatory pattern and advancement of RLE. Pt with difficulty advancing proper leg as she confused L/R LE's. Pt completed car transfer from sedan simulated height with mod assist for stand step transfer and min/mod assist to transfer RLE in/out of car. Throughout session pt requires cuing during each transfer for increased anterior weight shift to increase ease of task; pt with poor carryover throughout session. Pt also required frequent rest breaks 2/2 fatigue. At end of session pt returned to bed with +2 assist; pt with difficulty transfering either LE onto bed. Pt left in bed with all needs within reach & alarm set, visitor present.   Therapy Documentation Precautions:  Precautions Precautions: Fall Restrictions Weight Bearing Restrictions: No  Vital Signs: Tx 1: SpO2 = 92-100% on room air  Pain: Tx 1: Pt without c/o pain.  Tx 2: Pt c/o L sided HA - RN made aware & administered pain meds.   See Function Navigator for Current Functional Status.   Therapy/Group: Individual Therapy  Waunita Schooner 12/13/2016, 3:40 PM

## 2016-12-14 ENCOUNTER — Inpatient Hospital Stay (HOSPITAL_COMMUNITY): Payer: Medicare Other | Admitting: Speech Pathology

## 2016-12-14 DIAGNOSIS — G8190 Hemiplegia, unspecified affecting unspecified side: Secondary | ICD-10-CM

## 2016-12-14 DIAGNOSIS — I69920 Aphasia following unspecified cerebrovascular disease: Secondary | ICD-10-CM

## 2016-12-14 DIAGNOSIS — I6350 Cerebral infarction due to unspecified occlusion or stenosis of unspecified cerebral artery: Secondary | ICD-10-CM

## 2016-12-14 DIAGNOSIS — I69991 Dysphagia following unspecified cerebrovascular disease: Secondary | ICD-10-CM

## 2016-12-14 LAB — CBC WITH DIFFERENTIAL/PLATELET
BASOS ABS: 0 10*3/uL (ref 0.0–0.1)
BASOS PCT: 0 %
EOS ABS: 0.2 10*3/uL (ref 0.0–0.7)
EOS PCT: 2 %
HCT: 29.9 % — ABNORMAL LOW (ref 36.0–46.0)
Hemoglobin: 9.3 g/dL — ABNORMAL LOW (ref 12.0–15.0)
Lymphocytes Relative: 24 %
Lymphs Abs: 2 10*3/uL (ref 0.7–4.0)
MCH: 29 pg (ref 26.0–34.0)
MCHC: 31.1 g/dL (ref 30.0–36.0)
MCV: 93.1 fL (ref 78.0–100.0)
MONO ABS: 0.5 10*3/uL (ref 0.1–1.0)
MONOS PCT: 6 %
NEUTROS ABS: 5.6 10*3/uL (ref 1.7–7.7)
Neutrophils Relative %: 68 %
PLATELETS: 220 10*3/uL (ref 150–400)
RBC: 3.21 MIL/uL — ABNORMAL LOW (ref 3.87–5.11)
RDW: 14 % (ref 11.5–15.5)
WBC: 8.2 10*3/uL (ref 4.0–10.5)

## 2016-12-14 LAB — BASIC METABOLIC PANEL
ANION GAP: 7 (ref 5–15)
BUN: 27 mg/dL — AB (ref 6–20)
CALCIUM: 8.3 mg/dL — AB (ref 8.9–10.3)
CO2: 26 mmol/L (ref 22–32)
CREATININE: 1.14 mg/dL — AB (ref 0.44–1.00)
Chloride: 108 mmol/L (ref 101–111)
GFR calc Af Amer: 52 mL/min — ABNORMAL LOW (ref >60)
GFR, EST NON AFRICAN AMERICAN: 45 mL/min — AB (ref >60)
GLUCOSE: 99 mg/dL (ref 65–99)
Potassium: 4.6 mmol/L (ref 3.5–5.1)
Sodium: 141 mmol/L (ref 135–145)

## 2016-12-14 NOTE — Progress Notes (Signed)
Speech Language Pathology Daily Session Note  Patient Details  Name: Valerie Mcclure MRN: [id number] Date of Birth: 08-Feb-1941  Today's Date: 12/14/2016 SLP Individual Time: 1430-1530 SLP Individual Time Calculation (min): 60 min  Short Term Goals: Week 2: SLP Short Term Goal 1 (Week 2): Pt will consume therapeutic trials of thin liquids with no overt s/s of aspiration and supervision cues for use of swallowing precautions prior to upgrade.   SLP Short Term Goal 2 (Week 2): Pt will name basic, familiar items with min assist verbal cues for >75% accuracy.  SLP Short Term Goal 3 (Week 2): Pt will follow abstract commands during functional tasks with min assist verbal and visual cues.   SLP Short Term Goal 4 (Week 2): Pt will recognize and correct verbal errors during structured tasks with mod assist verbal cues.   SLP Short Term Goal 5 (Week 2): Pt will consume dys 2 textures and nectar-thick liquids via cup with supervision cues for use of swallowing precautions.    Skilled Therapeutic Interventions: Skilled treatment session focused on addressing speech-language and cognition goals. SLP facilitated session by providing Min assist verbal and visual cues for self-monitoring with 4 step picture sequencing task. SLP also facilitated session with Max assist sentence completion and phonemic placement cues for naming pictured objects.  Patient spontaneously stated "bathroom hurry" and as a result SLP and nurse tech provided 2+ assist with Steady to transfer from chair to toilet with Mod assist verbal and visual cues for recall of sequence and problem solving during self-care task.  Patient left in chair with alarm, quick release belt, and spouse at side.  Continue with current plan of care.   Function:  Cognition Comprehension Comprehension assist level: Understands basic 75 - 89% of the time/ requires cueing 10 - 24% of the time  Expression   Expression assist level: Expresses basic 25 - 49% of  the time/requires cueing 50 - 75% of the time. Uses single words/gestures.  Social Interaction Social Interaction assist level: Interacts appropriately 75 - 89% of the time - Needs redirection for appropriate language or to initiate interaction.  Problem Solving Problem solving assist level: Solves basic 50 - 74% of the time/requires cueing 25 - 49% of the time  Memory Memory assist level: Recognizes or recalls 50 - 74% of the time/requires cueing 25 - 49% of the time    Pain Pain Assessment Pain Assessment: No/denies pain  Therapy/Group: Individual Therapy  Carmelia Roller., [place] [phone]  [place] 12/14/2016, 3:46 PM

## 2016-12-14 NOTE — Progress Notes (Signed)
[facility] PHYSICAL MEDICINE & REHABILITATION     PROGRESS NOTE  Subjective/Complaints:  Pt seen laying in bed this AM.  Daughter at bedside.  She believes medication is making her mother sleepy.   ROS: Denies CP, SOB, N/V/D.   Objective: Vital Signs: Blood pressure (!) 123/58, pulse (!) 57, temperature 97.9 F (36.6 C), temperature source Oral, resp. rate 18, height 5\' 3"  (1.6 m), weight 79.8 kg (175 lb 14.4 oz), SpO2 98 %. No results found.  Recent Labs  12/14/16 0728  WBC 8.2  HGB 9.3*  HCT 29.9*  PLT 220    Recent Labs  12/14/16 0607  NA 141  K 4.6  CL 108  GLUCOSE 99  BUN 27*  CREATININE 1.14*  CALCIUM 8.3*   CBG (last 3)  No results for input(s): GLUCAP in the last 72 hours.  Wt Readings from Last 3 Encounters:  12/03/16 79.8 kg (175 lb 14.4 oz)  11/29/16 79.5 kg (175 lb 4.3 oz)  07/31/16 74.8 kg (165 lb)    Physical Exam:  BP (!) 123/58 (BP Location: Right Arm)   Pulse (!) 57   Temp 97.9 F (36.6 C) (Oral)   Resp 18   Ht 5\' 3"  (1.6 m)   Wt 79.8 kg (175 lb 14.4 oz)   SpO2 98%   BMI 31.16 kg/m  Constitutional: She appears well-developedand well-nourished.  HENT: Normocephalicand atraumatic.  Eyes: EOMI. No discharge.  Cardiovascular: RRR. No JVD. Respiratory: Effort normal and breath sounds normal.  GI: Soft. Bowel sounds are normal.  Musculoskeletal: She no edema and no tenderness.  Neurological: She is alertand oriented x2.  Right facial weakness with dysarthria.  Expressive >>receptive aphasia, continues to improve.  Motor RUE: 1/5 shoulder abduction, 1/5 distally RLE: 4-/5 HF, KE, 2+/5 ADF/PF Increased tone RLE, difficult to assess due to pt resistance, improved LUE/LLE: 4+/5 throughout Skin: Skin is warmand dry.  Psychiatric: She has a normal mood and affect. Her behavior is normal.    Assessment/Plan: 1. Functional deficits secondary to left MCA distribution infarct which require 3+ hours per day of interdisciplinary therapy in  a comprehensive inpatient rehab setting. Physiatrist is providing close team supervision and 24 hour management of active medical problems listed below. Physiatrist and rehab team continue to assess barriers to discharge/monitor patient progress toward functional and medical goals.  Function:  Bathing Bathing position   Position: Shower  Bathing parts Body parts bathed by patient: Chest, Abdomen, Front perineal area, Right upper leg, Left upper leg Body parts bathed by helper: Right arm, Buttocks, Right lower leg, Left lower leg, Back, Left arm  Bathing assist Assist Level:  (Mod assist)      Upper Body Dressing/Undressing Upper body dressing   What is the patient wearing?: Pull over shirt/dress     Pull over shirt/dress - Perfomed by patient: Put head through opening, Pull shirt over trunk Pull over shirt/dress - Perfomed by helper: Thread/unthread left sleeve, Thread/unthread right sleeve        Upper body assist Assist Level:  (Mod assist)      Lower Body Dressing/Undressing Lower body dressing   What is the patient wearing?: Pants, Non-skid slipper socks     Pants- Performed by patient: Thread/unthread right pants leg Pants- Performed by helper: Thread/unthread right pants leg, Thread/unthread left pants leg, Pull pants up/down   Non-skid slipper socks- Performed by helper: Don/doff right sock, Don/doff left sock  Lower body assist Assist for lower body dressing:  (Total)      Toileting Toileting Toileting activity did not occur: No continent bowel/bladder event Toileting steps completed by patient: Performs perineal hygiene Toileting steps completed by helper: Adjust clothing prior to toileting, Adjust clothing after toileting Toileting Assistive Devices: Grab bar or rail  Toileting assist Assist level: Two helpers   Transfers Chair/bed transfer Chair/bed transfer activity did not occur: N/A Chair/bed transfer method: Stand pivot Chair/bed  transfer assist level: Moderate assist (Pt 50 - 74%/lift or lower) Chair/bed transfer assistive device: Armrests     Locomotion Ambulation     Max distance: 40 ft Assist level: Moderate assist (Pt 50 - 74%)   Wheelchair   Type: Manual Max wheelchair distance: 40 Assist Level: Moderate assistance (Pt 50 - 74%)  Cognition Comprehension Comprehension assist level: Understands basic 75 - 89% of the time/ requires cueing 10 - 24% of the time  Expression Expression assist level: Expresses basic 50 - 74% of the time/requires cueing 25 - 49% of the time. Needs to repeat parts of sentences.  Social Interaction Social Interaction assist level: Interacts appropriately with others - No medications needed.  Problem Solving Problem solving assist level: Solves basic 50 - 74% of the time/requires cueing 25 - 49% of the time  Memory Memory assist level: Recognizes or recalls 50 - 74% of the time/requires cueing 25 - 49% of the time    Medical Problem List and Plan: 1. Right hemiparesis and aphasia  secondary to left MCA distribution infarct  Cont CIR  Bracing ordered  With spasticity, baclofen 5 TID causing drowsiness, will d/c, plan for botox as outpt 2. DVT Prophylaxis/Anticoagulation: Mechanical: Sequential compression devices, below knee Bilateral lower extremities 3. Chronic back pain/Pain Management: On pamelor for neuropathy and MS contin bid.  4. Mood: LCSW to follow for evaluation and support.  5. Neuropsych: This patient is not capable of making decisions on her own behalf. 6. Skin/Wound Care: routine pressure relief measures.  7. Fluids/Electrolytes/Nutrition: May need IVF at nights if unable to maintain hydration.  8.HTN: Monitor BP bid.   Avapro increased to 112.5 on 5/22  Monitor with increased mobility  Controlled 5/28 9. PAC's: Monitor heart rate bid. Currently in NSR.  10. Diastolic Heart failure: Asymptomatic.  Filed Weights   12/03/16 1630  Weight: 79.8 kg (175 lb  14.4 oz)  11.H/o anemia with multiple transfusion:  Hb 9.6 on 5/21  Labs ordered for Monday  Cont to monitor 12. Depression: has been managed on Prozac.  13. CKD: Baseline SCr- 1.4 range. Continue to monitor. Check lytes serially to monitor stability.   Cr 1.14 on 5/28  Cont to monitor 14. Prediabetes: Hgb A1C- 5.8. Educate on appropriate diet as cognition improves.   Monitor with increased mobility  Relatively controlled 5/28 15. Dysphagia: Advanced to D2 nectar.  16. Hypoalbuminemia  Supplement initiated 5/18 17. E. Coli UTI   Bactrim completed 5/23 18. ABLA  Hb 9.3 on 5/28   Cont to monitor  LOS (Days) 11 A FACE TO FACE EVALUATION WAS PERFORMED  Christepher Melchior Lorie Phenix 12/14/2016 10:47 AM

## 2016-12-15 ENCOUNTER — Inpatient Hospital Stay (HOSPITAL_COMMUNITY): Payer: Medicare Other | Admitting: Speech Pathology

## 2016-12-15 ENCOUNTER — Inpatient Hospital Stay (HOSPITAL_COMMUNITY): Payer: Medicare Other | Admitting: Physical Therapy

## 2016-12-15 ENCOUNTER — Inpatient Hospital Stay (HOSPITAL_COMMUNITY): Payer: Medicare Other | Admitting: Occupational Therapy

## 2016-12-15 NOTE — Progress Notes (Signed)
Speech Language Pathology Daily Session Note  Patient Details  Name: Valerie Mcclure MRN: [id number] Date of Birth: 11/05/1940  Today's Date: 12/15/2016 SLP Individual Time: 1034-1130 SLP Individual Time Calculation (min): 56 min  Short Term Goals: Week 2: SLP Short Term Goal 1 (Week 2): Pt will consume therapeutic trials of thin liquids with no overt s/s of aspiration and supervision cues for use of swallowing precautions prior to upgrade.   SLP Short Term Goal 2 (Week 2): Pt will name basic, familiar items with min assist verbal cues for >75% accuracy.  SLP Short Term Goal 3 (Week 2): Pt will follow abstract commands during functional tasks with min assist verbal and visual cues.   SLP Short Term Goal 4 (Week 2): Pt will recognize and correct verbal errors during structured tasks with mod assist verbal cues.   SLP Short Term Goal 5 (Week 2): Pt will consume dys 2 textures and nectar-thick liquids via cup with supervision cues for use of swallowing precautions.    Skilled Therapeutic Interventions:  Pt was seen for skilled ST targeting goals for dysphagia and communication.  SLP facilitated the session with trials of water per the water protocol to continue working towards diet progression.  Pt needed min assist verbal and tactile cues for rate and portion control.  Even with more controlled cup sips, the timing of pt's swallow appeared impaired and was audible.  Pt also demonstrated belching and eyes watering after the swallow which may further suggest a timing impairment and compromised airway protection.  Recommend that pt remain on nectar thick liquids for now with sips of water in between meals per the water protocol.  Pt was able to produce functional phrases for ~75% accuracy with mod assist verbal and visual cues to monitor and correct perseverative errors.  Pt needed max assist and choice of two to appropriately pair a functional phrase with a  Corresponding situational photograph.  Pt  was left in bed with bed alarm set and call bell within reach.  Continue per current plan of care.    Function:  Eating Eating   Modified Consistency Diet: Yes Eating Assist Level: Supervision or verbal cues           Cognition Comprehension Comprehension assist level: Understands basic 75 - 89% of the time/ requires cueing 10 - 24% of the time  Expression   Expression assist level: Expresses basic 25 - 49% of the time/requires cueing 50 - 75% of the time. Uses single words/gestures.  Social Interaction Social Interaction assist level: Interacts appropriately 75 - 89% of the time - Needs redirection for appropriate language or to initiate interaction.  Problem Solving Problem solving assist level: Solves basic 50 - 74% of the time/requires cueing 25 - 49% of the time  Memory Memory assist level: Recognizes or recalls 25 - 49% of the time/requires cueing 50 - 75% of the time    Pain Pain Assessment Pain Assessment: No/denies pain  Therapy/Group: Individual Therapy  Denni France, Elmyra Ricks L 12/15/2016, 11:30 AM

## 2016-12-15 NOTE — Progress Notes (Signed)
[place] PHYSICAL MEDICINE & REHABILITATION     PROGRESS NOTE  Subjective/Complaints:  Pt seen sitting up in her chair working with therapies this AM.  She slept well overnight.  Daughter not at bedside this AM.   ROS: Denies CP, SOB, N/V/D.   Objective: Vital Signs: Blood pressure (!) 118/50, pulse 72, temperature 97.5 F (36.4 C), temperature source Oral, resp. rate 17, height 5\' 3"  (1.6 m), weight 79.8 kg (175 lb 14.4 oz), SpO2 100 %. No results found.  Recent Labs  12/14/16 0728  WBC 8.2  HGB 9.3*  HCT 29.9*  PLT 220    Recent Labs  12/14/16 0607  NA 141  K 4.6  CL 108  GLUCOSE 99  BUN 27*  CREATININE 1.14*  CALCIUM 8.3*   CBG (last 3)  No results for input(s): GLUCAP in the last 72 hours.  Wt Readings from Last 3 Encounters:  12/03/16 79.8 kg (175 lb 14.4 oz)  11/29/16 79.5 kg (175 lb 4.3 oz)  07/31/16 74.8 kg (165 lb)    Physical Exam:  BP (!) 118/50   Pulse 72   Temp 97.5 F (36.4 C) (Oral)   Resp 17   Ht 5\' 3"  (1.6 m)   Wt 79.8 kg (175 lb 14.4 oz)   SpO2 100%   BMI 31.16 kg/m  Constitutional: She appears well-developedand well-nourished.  HENT: Normocephalicand atraumatic.  Eyes: EOMI. No discharge.  Cardiovascular: RRR. No JVD. Respiratory: Effort normal and breath sounds normal.  GI: Soft. Bowel sounds are normal.  Musculoskeletal: She no edema and no tenderness.  Neurological: She is alertand oriented x1.  Right facial weakness with dysarthria.  Expressive >>receptive aphasia.  Motor RUE: 2/5 shoulder abduction, 2-/5 elbow flexion/extension, 1+/5 distally RLE: 4-/5 HF, KE, 3/5 ADF/PF Increased tone RLE, difficult to assess due to pt resistance LUE/LLE: 4+/5 throughout Skin: Skin is warmand dry.  Psychiatric: She has a normal mood and affect. Her behavior is normal.    Assessment/Plan: 1. Functional deficits secondary to left MCA distribution infarct which require 3+ hours per day of interdisciplinary therapy in a  comprehensive inpatient rehab setting. Physiatrist is providing close team supervision and 24 hour management of active medical problems listed below. Physiatrist and rehab team continue to assess barriers to discharge/monitor patient progress toward functional and medical goals.  Function:  Bathing Bathing position   Position: Wheelchair/chair at sink  Bathing parts Body parts bathed by patient: Chest, Abdomen, Front perineal area, Right upper leg, Left upper leg, Right arm, Right lower leg, Left lower leg Body parts bathed by helper: Left arm, Buttocks, Back  Bathing assist Assist Level: Touching or steadying assistance(Pt > 75%)      Upper Body Dressing/Undressing Upper body dressing   What is the patient wearing?: Pull over shirt/dress     Pull over shirt/dress - Perfomed by patient: Put head through opening, Pull shirt over trunk Pull over shirt/dress - Perfomed by helper: Thread/unthread left sleeve, Thread/unthread right sleeve        Upper body assist Assist Level:  (Mod assist)      Lower Body Dressing/Undressing Lower body dressing   What is the patient wearing?: Pants, Non-skid slipper socks     Pants- Performed by patient: Thread/unthread right pants leg Pants- Performed by helper: Thread/unthread right pants leg, Thread/unthread left pants leg, Pull pants up/down   Non-skid slipper socks- Performed by helper: Don/doff right sock, Don/doff left sock  Lower body assist Assist for lower body dressing:  (Total)      Toileting Toileting Toileting activity did not occur: No continent bowel/bladder event Toileting steps completed by patient: Performs perineal hygiene Toileting steps completed by helper: Adjust clothing prior to toileting, Adjust clothing after toileting Toileting Assistive Devices: Grab bar or rail  Toileting assist Assist level: Two helpers   Transfers Chair/bed transfer Chair/bed transfer activity did not occur:  N/A Chair/bed transfer method: Stand pivot Chair/bed transfer assist level: Moderate assist (Pt 50 - 74%/lift or lower) Chair/bed transfer assistive device: Armrests     Locomotion Ambulation     Max distance: 40 ft Assist level: Moderate assist (Pt 50 - 74%)   Wheelchair   Type: Manual Max wheelchair distance: 40 Assist Level: Moderate assistance (Pt 50 - 74%)  Cognition Comprehension Comprehension assist level: Understands basic 75 - 89% of the time/ requires cueing 10 - 24% of the time  Expression Expression assist level: Expresses basic 25 - 49% of the time/requires cueing 50 - 75% of the time. Uses single words/gestures.  Social Interaction Social Interaction assist level: Interacts appropriately 75 - 89% of the time - Needs redirection for appropriate language or to initiate interaction.  Problem Solving Problem solving assist level: Solves basic 50 - 74% of the time/requires cueing 25 - 49% of the time  Memory Memory assist level: Recognizes or recalls 50 - 74% of the time/requires cueing 25 - 49% of the time    Medical Problem List and Plan: 1. Right hemiparesis and aphasia  secondary to left MCA distribution infarct  Cont CIR  Bracing ordered  With spasticity, baclofen 5 TID causing drowsiness, will d/c, plan for botox as outpt 2. DVT Prophylaxis/Anticoagulation: Mechanical: Sequential compression devices, below knee Bilateral lower extremities 3. Chronic back pain/Pain Management: On pamelor for neuropathy and MS contin bid.  4. Mood: LCSW to follow for evaluation and support.  5. Neuropsych: This patient is not capable of making decisions on her own behalf. 6. Skin/Wound Care: routine pressure relief measures.  7. Fluids/Electrolytes/Nutrition: May need IVF at nights if unable to maintain hydration.  8.HTN: Monitor BP bid.   Avapro increased to 112.5 on 5/22  Monitor with increased mobility  Controlled 5/29 9. PAC's: Monitor heart rate bid. Currently in NSR.   10. Diastolic Heart failure: Asymptomatic.  Filed Weights   12/03/16 1630  Weight: 79.8 kg (175 lb 14.4 oz)  11.H/o anemia with multiple transfusion with ABLA:  Hb 9.3 on 5/28  Cont to monitor 12. Depression: has been managed on Prozac.  13. CKD: Baseline SCr- 1.4 range. Continue to monitor. Check lytes serially to monitor stability.   Cr 1.14 on 5/28  Cont to monitor 14. Prediabetes: Hgb A1C- 5.8. Educate on appropriate diet as cognition improves.   Monitor with increased mobility  Relatively controlled 5/28 15. Dysphagia: Advanced to D2 nectar.  16. Hypoalbuminemia  Supplement initiated 5/18 17. E. Coli UTI   Bactrim completed 5/23  LOS (Days) 12 A FACE TO FACE EVALUATION WAS PERFORMED  Ankit Lorie Phenix 12/15/2016 10:11 AM

## 2016-12-15 NOTE — Progress Notes (Signed)
Recreational Therapy Session Note  Patient Details  Name: Valerie Mcclure MRN: [id number] Date of Birth: 09-10-1940 Today's Date: 12/15/2016  Pain: no c/o Skilled Therapeutic Interventions/Progress Updates: Met with pt and husband who was at bedside.  Husband inquiring about pt current status with therapies and if and how she was walking in PT. Session spent reviewing therapy goals and current status through review of progress notes.  Pt shared that she enjoyed singing at church and began singing during this session.  husband encouraging pt.   [place] 12/15/2016, 3:05 PM

## 2016-12-15 NOTE — Plan of Care (Signed)
Problem: RH BLADDER ELIMINATION Goal: RH STG MANAGE BLADDER WITH ASSISTANCE STG Manage Bladder With min Assistance   Outcome: Progressing Patient continent x 2 using commode in bathroom transferred with stedy lift.. I/O cathed x 1 this am.

## 2016-12-15 NOTE — Progress Notes (Signed)
Occupational Therapy Session Note  Patient Details  Name: Valerie Mcclure MRN: [id number] Date of Birth: 1940-10-31  Today's Date: 12/15/2016 OT Individual Time: 6948-5462 OT Individual Time Calculation (min): 58 min    Short Term Goals: Week 2:  OT Short Term Goal 1 (Week 2): Pt will complete bathing with AE PRN at min assist level OT Short Term Goal 2 (Week 2): Pt will complete UB dressing with min assist  OT Short Term Goal 3 (Week 2): pt will don pants with mod assist at sit > stand level OT Short Term Goal 4 (Week 2): Pt will locate items on Rt to complete self-care tasks with min cues  Skilled Therapeutic Interventions/Progress Updates:    Treatment session with focus on sit > stand and Rt attention during self-care tasks.  Pt received in bed eating breakfast.  Engaged in visual scanning to Rt to locate juice in Rt visual field with increased time and cues.  Assist to advance Rt hip to EOB in preparation for transfer to w/c.  Blocking and manual facilitation to RLE due to extensor tone with sit > stand for transfer with mod assist.  Engaged in bathing at sit > stand level at sink with hand over hand to incorporate RUE into bathing.  Min-mod assist for standing balance while washing perineal area and buttocks.  Continues to require increased cues and assist for dressing due to Rt inattention and decreased sequencing.  Left upright in w/c with quick release belt, chair alarm, and half lap tray for positioning.  Therapy Documentation Precautions:  Precautions Precautions: Fall Restrictions Weight Bearing Restrictions: No Pain: Pain Assessment Pain Assessment: No/denies pain Pain Score: 0-No pain  See Function Navigator for Current Functional Status.   Therapy/Group: Individual Therapy  Copelyn Widmer, [place] 12/15/2016, 8:30 AM

## 2016-12-15 NOTE — Progress Notes (Signed)
Physical Therapy Note  Patient Details  Name: ARLESIA KIEL MRN: [id number] Date of Birth: 11-Mar-1941 Today's Date: 12/15/2016    Time: 1415-1515 60 minutes  1:1 No c/o pain.  Session focused on NMR for pelvic alignment, decreasing extensor tone in Rt LE, improving posture and coordination.  Stand pivot transfers with mod A with PT blocking Rt LE extension.  Sit to stand blocked practice wihtout UE support with focus on Rt knee and hip flexion with mod A.  Standing squat and reach task with focus on neutral posture and Rt knee flexion with PT blocking Rt LE from extending, mod A and facilitation at pelvis.  Tall kneeling with focus on postural alignment with +2 assist to get into position and max manual facilitation for postural alignment and control.  Seated pelvic shifts with tactile and visual cues with improved pelvic alignment after blocked practice.  Pt asks for water at end of session.  Oral care performed with cues for sequencing.  Pt had water with frequent yawning, no coughing noted.   Delani Kohli 12/15/2016, 3:15 PM

## 2016-12-16 ENCOUNTER — Inpatient Hospital Stay (HOSPITAL_COMMUNITY): Payer: Medicare Other | Admitting: Occupational Therapy

## 2016-12-16 ENCOUNTER — Inpatient Hospital Stay (HOSPITAL_COMMUNITY): Payer: Medicare Other | Admitting: Speech Pathology

## 2016-12-16 ENCOUNTER — Inpatient Hospital Stay (HOSPITAL_COMMUNITY): Payer: Medicare Other | Admitting: Physical Therapy

## 2016-12-16 NOTE — Progress Notes (Signed)
[place] PHYSICAL MEDICINE & REHABILITATION     PROGRESS NOTE  Subjective/Complaints:  Pt seen laying in bed this AM.  Daughter not at bedside.  Pt states she slept well overnight.   ROS: Denies CP, SOB, N/V/D.   Objective: Vital Signs: Blood pressure (!) 147/52, pulse 64, temperature 98.2 F (36.8 C), temperature source Oral, resp. rate 18, height 5\' 3"  (1.6 m), weight 72.6 kg (160 lb), SpO2 99 %. No results found.  Recent Labs  12/14/16 0728  WBC 8.2  HGB 9.3*  HCT 29.9*  PLT 220    Recent Labs  12/14/16 0607  NA 141  K 4.6  CL 108  GLUCOSE 99  BUN 27*  CREATININE 1.14*  CALCIUM 8.3*   CBG (last 3)  No results for input(s): GLUCAP in the last 72 hours.  Wt Readings from Last 3 Encounters:  12/16/16 72.6 kg (160 lb)  11/29/16 79.5 kg (175 lb 4.3 oz)  07/31/16 74.8 kg (165 lb)    Physical Exam:  BP (!) 147/52 (BP Location: Left Arm)   Pulse 64   Temp 98.2 F (36.8 C) (Oral)   Resp 18   Ht 5\' 3"  (1.6 m)   Wt 72.6 kg (160 lb)   SpO2 99%   BMI 28.34 kg/m  Constitutional: She appears well-developedand well-nourished.  HENT: Normocephalicand atraumatic.  Eyes: EOMI. No discharge.  Cardiovascular: RRR. No JVD. Respiratory: Effort normal and breath sounds normal.  GI: Soft. Bowel sounds are normal.  Musculoskeletal: She no edema and no tenderness.  Neurological: She is alertand oriented x1.  Right facial weakness with dysarthria.  Expressive >>receptive aphasia.  Motor RUE: 2/5 shoulder abduction, 2-/5 elbow flexion/extension, 1+/5 distally RLE: 4-/5 HF, KE, 3/5 ADF/PF (stable) Extensor tone RLE LUE/LLE: 4+/5 throughout Skin: Skin is warmand dry.  Psychiatric: She has a normal mood and affect. Her behavior is normal.    Assessment/Plan: 1. Functional deficits secondary to left MCA distribution infarct which require 3+ hours per day of interdisciplinary therapy in a comprehensive inpatient rehab setting. Physiatrist is providing close team  supervision and 24 hour management of active medical problems listed below. Physiatrist and rehab team continue to assess barriers to discharge/monitor patient progress toward functional and medical goals.  Function:  Bathing Bathing position   Position: Wheelchair/chair at sink  Bathing parts Body parts bathed by patient: Chest, Abdomen, Front perineal area, Right upper leg, Left upper leg, Right arm, Right lower leg, Left lower leg Body parts bathed by helper: Left arm, Buttocks, Back  Bathing assist Assist Level: Touching or steadying assistance(Pt > 75%)      Upper Body Dressing/Undressing Upper body dressing   What is the patient wearing?: Pull over shirt/dress     Pull over shirt/dress - Perfomed by patient: Thread/unthread left sleeve, Put head through opening, Pull shirt over trunk Pull over shirt/dress - Perfomed by helper: Thread/unthread right sleeve        Upper body assist Assist Level: Touching or steadying assistance(Pt > 75%)      Lower Body Dressing/Undressing Lower body dressing   What is the patient wearing?: Pants, Non-skid slipper socks     Pants- Performed by patient: Thread/unthread right pants leg Pants- Performed by helper: Thread/unthread right pants leg, Thread/unthread left pants leg, Pull pants up/down   Non-skid slipper socks- Performed by helper: Don/doff right sock, Don/doff left sock                  Lower body assist Assist for lower  body dressing:  (Total)      Toileting Toileting Toileting activity did not occur: No continent bowel/bladder event Toileting steps completed by patient: Performs perineal hygiene (patient attempted to perform hygiene) Toileting steps completed by helper: Adjust clothing prior to toileting, Performs perineal hygiene, Adjust clothing after toileting Toileting Assistive Devices: Grab bar or rail  Toileting assist Assist level: Two helpers   Transfers Chair/bed transfer Chair/bed transfer activity did  not occur: N/A Chair/bed transfer method: Stand pivot Chair/bed transfer assist level: Moderate assist (Pt 50 - 74%/lift or lower) Chair/bed transfer assistive device: Armrests     Locomotion Ambulation     Max distance: 40 ft Assist level: Moderate assist (Pt 50 - 74%)   Wheelchair   Type: Manual Max wheelchair distance: 40 Assist Level: Moderate assistance (Pt 50 - 74%)  Cognition Comprehension Comprehension assist level: Understands basic 75 - 89% of the time/ requires cueing 10 - 24% of the time  Expression Expression assist level: Expresses basic 25 - 49% of the time/requires cueing 50 - 75% of the time. Uses single words/gestures.  Social Interaction Social Interaction assist level: Interacts appropriately 75 - 89% of the time - Needs redirection for appropriate language or to initiate interaction.  Problem Solving Problem solving assist level: Solves basic 50 - 74% of the time/requires cueing 25 - 49% of the time  Memory Memory assist level: Recognizes or recalls 25 - 49% of the time/requires cueing 50 - 75% of the time    Medical Problem List and Plan: 1. Right hemiparesis and aphasia  secondary to left MCA distribution infarct  Cont CIR  Bracing ordered  With spasticity, baclofen 5 TID causing drowsiness, d/ced, plan for botox as outpt 2. DVT Prophylaxis/Anticoagulation: Mechanical: Sequential compression devices, below knee Bilateral lower extremities 3. Chronic back pain/Pain Management: On pamelor for neuropathy and MS contin bid.  4. Mood: LCSW to follow for evaluation and support.  5. Neuropsych: This patient is not capable of making decisions on her own behalf. 6. Skin/Wound Care: routine pressure relief measures.  7. Fluids/Electrolytes/Nutrition: May need IVF at nights if unable to maintain hydration.  8.HTN: Monitor BP bid.   Avapro increased to 112.5 on 5/22  Monitor with increased mobility  Overall controlled 5/30, ?trending up again 9. PAC's: Monitor  heart rate bid. Currently in NSR.  10. Diastolic Heart failure: Asymptomatic.  Filed Weights   12/03/16 1630 12/16/16 0430  Weight: 79.8 kg (175 lb 14.4 oz) 72.6 kg (160 lb)  11.H/o anemia with multiple transfusion with ABLA:  Hb 9.3 on 5/28  Cont to monitor 12. Depression: has been managed on Prozac.  13. CKD: Baseline SCr- 1.4 range. Continue to monitor. Check lytes serially to monitor stability.   Cr 1.14 on 5/28  Cont to monitor  Will d/c IVF and monitor 14. Prediabetes: Hgb A1C- 5.8. Educate on appropriate diet as cognition improves.   Monitor with increased mobility  Controlled 5/28 15. Dysphagia: Advanced to D2 nectar.  16. Hypoalbuminemia  Supplement initiated 5/18 17. E. Coli UTI   Bactrim completed 5/23  LOS (Days) 13 A FACE TO FACE EVALUATION WAS PERFORMED  Akaila Rambo Lorie Phenix 12/16/2016 9:44 AM

## 2016-12-16 NOTE — Progress Notes (Signed)
Occupational Therapy Session Note  Patient Details  Name: Valerie Mcclure MRN: [id number] Date of Birth: 03-13-1941  Today's Date: 12/16/2016 OT Individual Time: 1755-1825 OT Individual Time Calculation (min): 30 min    Short Term Goals: Week 2:  OT Short Term Goal 1 (Week 2): Pt will complete bathing with AE PRN at min assist level OT Short Term Goal 2 (Week 2): Pt will complete UB dressing with min assist  OT Short Term Goal 3 (Week 2): pt will don pants with mod assist at sit > stand level OT Short Term Goal 4 (Week 2): Pt will locate items on Rt to complete self-care tasks with min cues  Skilled Therapeutic Interventions/Progress Updates: Patient was dozing and end bed at the start of this session.   Her husband stated she'd had a long day.     Patient concurred to PROM for this session.    Patient left positioned in bed with bed alarm,  call bell and phone in place.  Continue with primary OT     Therapy Documentation Precautions:  Precautions Precautions: Fall Restrictions Weight Bearing Restrictions: No Pain:denied   See Function Navigator for Current Functional Status.   Therapy/Group: Individual Therapy  Herschell Dimes 12/16/2016, 7:36 PM

## 2016-12-16 NOTE — Patient Care Conference (Signed)
Inpatient RehabilitationTeam Conference and Plan of Care Update Date: 12/16/2016   Time: 11:45 AM    Patient Name: Valerie Mcclure      Medical Record Number: [id number]  Date of Birth: May 22, 1941 Sex: Female         Room/Bed: 4W09C/4W09C-01 Payor Info: Payor: [employer] MEDICARE / Plan: [facility] MEDICARE / Product Type: *No Product type* /    Admitting Diagnosis: L MCA Infarct  Admit Date/Time:  12/03/2016  3:58 PM Admission Comments: No comment available   Primary Diagnosis:  Right hemiparesis ([place]) Principal Problem: Right hemiparesis [facility])  Patient Active Problem List   Diagnosis Date Noted  . Spastic hemiplegia of right dominant side as late effect of cerebral infarction ([place])   . E-coli UTI   . Anemia of chronic disease   . Benign essential HTN   . Dysphagia, post-stroke   . Expressive aphasia   . Anterior cerebral circulation hemorrhagic infarction ([place]) 12/03/2016  . PAC (premature atrial contraction) 12/03/2016  . Acute ischemic left MCA stroke ([place]) 12/03/2016  . Aphasia as late effect of stroke 12/03/2016  . Right hemiparesis ([place])   . Nontraumatic subcortical hemorrhage of left cerebral hemisphere ([place])   . Acute embolic stroke ([place]) 75/88/3254  . Mild aortic regurgitation 08/20/2016  . Bilateral edema of lower extremity 04/07/2016  . Pancreatic duct dilated 01/09/2016  . CKD (chronic kidney disease) 12/25/2015  . Mild tricuspid regurgitation 12/25/2015  . Mild mitral regurgitation 12/25/2015  . Prediabetes 06/10/2015  . Edema 06/04/2015  . Abdominal pain, chronic, right upper quadrant 06/04/2015  . Postmenopausal disorder 04/03/2014  . Chronic low back pain   . PAIN IN THORACIC SPINE 04/18/2010  . Coronary atherosclerosis 01/24/2009  . DEGENERATIVE JOINT DISEASE 01/23/2009  . ARTHRITIS, LEFT KNEE 12/13/2008  . HERNIATED LUMBAR DISC 12/13/2008  . Hypothyroidism 11/21/2007  . Hyperlipidemia 11/09/2007  . ANEMIA 11/09/2007  . Essential hypertension  11/09/2007  . Osteopenia 11/09/2007    Expected Discharge Date: Expected Discharge Date: 12/24/16  Team Members Present: Physician leading conference: Dr. Delice Lesch Social Worker Present: Ovidio Kin, LCSW Nurse Present: Other (comment) Cecille Rubin Dunn[place]-RN) PT Present: Roderic Ovens, PT OT Present: Simonne Come, OT SLP Present: Stormy Fabian, SLP PPS Coordinator present : Daiva Nakayama, RN, CRRN     Current Status/Progress Goal Weekly Team Focus  Medical   Right hemiparesis and aphasia  secondary to left MCA distribution infarct  Improve mobility, transfers, HTN, CKD, dysphagia  See above   Bowel/Bladder   incontinent at times bowel and bladder PVR's q 6-8hrs; LBM 12/15/16  decrease in ML's reteained  assess/offer toileting q shift and prn   Swallow/Nutrition/ Hydration   Tolerating dys 2 textures and nectar thick liquids, initiated the water protocol to continue working towards diet progression   least restrictive PO supervision   continue to address carryover of safe swallowing strategies for repeat MBS    ADL's   Mod assist bathing and UB dressing, total assist LB dressing, mod assist stand pivot transfers, poor trunk control and RLE extensor tone  Min assist  ADL retraining, transfers, sitting balance, RUE NMR, Rt attention   Mobility   mod A transfers, max A gait  min A gait, supervision transfers  balance, activity tolerance, gait, functional mobility   Communication   Improved production of functional phrases, mod-max A for overall functional communication   Min assist   functional communication, continue to address self monitoring and correction of verbal errors    Safety/Cognition/ Behavioral Observations  MAX  A  Min A  Safety awareness, Impulsitivty with po intake   Pain   no complaints of pain  pain less than or equal to 2  assess pain q shift and prn   Skin   incision with dermabond to lower abd/pelvic bruise righ hip  free of infection and breakdown  assess ski q  shift and prn for changes    Rehab Goals Patient on target to meet rehab goals: Yes *See Care Plan and progress notes for long and short-term goals.  Barriers to Discharge: HTN, CKD, dysphagia, urinary incontinence, mobility, cognition, safety    Possible Resolutions to Barriers:  Therapies, optimize BP meds, d/c IVF - monitor renal function    Discharge Planning/Teaching Needs:  Husband has been in but needs to begin family training in preparation for home  Teaching to be done closer to d/c and prn.   Team Discussion:  Goals of min assist level. Working balance, activity tolerance and r-inattention. Dyes 2 nectar diet trial today. Starting timed toileting due to incont B & B. Stopped IVF today will check labs tomorrow. Start medicine for tone which is interfering with therapies. Husband and daughter have been here during therapies no hands on yet.  Revisions to Treatment Plan:  DC 6/7   Continued Need for Acute Rehabilitation Level of Care: The patient requires daily medical management by a physician with specialized training in physical medicine and rehabilitation for the following conditions: Daily direction of a multidisciplinary physical rehabilitation program to ensure safe treatment while eliciting the highest outcome that is of practical value to the patient.: Yes Daily medical management of patient stability for increased activity during participation in an intensive rehabilitation regime.: Yes Daily analysis of laboratory values and/or radiology reports with any subsequent need for medication adjustment of medical intervention for : Neurological problems;Blood pressure problems;Renal problems;Urological problems  Valerie Mcclure 12/16/2016, 3:57 PM

## 2016-12-16 NOTE — Progress Notes (Signed)
Physical Therapy Session Note  Patient Details  Name: Valerie Mcclure MRN: [id number] Date of Birth: [birth year]  Today's Date: 12/16/2016 PT Individual Time: 1007-1105 PT Individual Time Calculation (min): 58 min    Skilled Therapeutic Interventions/Progress Updates:  Pt received in room & agreeable to tx. Session focused on functional mobility (transfers, bed mobility, and gait training) with focus on NMR, weight shifting, awareness & balance.  In gym pt completed stand pivot transfers with mod assist overall with max verbal cuing for sequencing and education to focus on task & instructions given - pt with improved ability to complete transfer when doing so. Pt completed supine<>sitting on mat table with mod assist with manual facilitation for weight shifting to transfer LE on/off mat and to upright trunk. On mat table pt performed BLE bridging with additional adductor hold with therapist providing max assist to maintain RLE position and multimodal cuing for overall technique. Pt somewhat able to activate glutes and hamstrings but unable to clear buttocks from mat. Gait training/NMR x 20 ft + 40 ft with mod assist overall with therapist providing max cuing for increased step length but pt continues to demonstrate step to pattern. Pt also requires manual facilitation for weight shifting L<>R and max verbal cuing for forward gaze - pt's gait much improved with upright posture. Pt completed sit<>stand transfers with therapist providing min assist and heavy cuing for anterior weight shifting and to achieve full upright standing. Pt also engaged in activity focusing on anterior weight shifting with education for head/hips relationship to assist with transfers. At end of session pt left sitting in w/c in room with NT present.   Therapy Documentation Precautions:  Precautions Precautions: Fall Restrictions Weight Bearing Restrictions: No  Pain: No c/o pain.   See Function Navigator for Current  Functional Status.   Therapy/Group: Individual Therapy  Waunita Schooner 12/16/2016, 12:41 PM

## 2016-12-16 NOTE — Progress Notes (Signed)
Speech Language Pathology Daily Session Note  Patient Details  Name: Valerie Mcclure MRN: [id number] Date of Birth: 01/31/1941  Today's Date: 12/16/2016 SLP Individual Time: 1130-1200 SLP Individual Time Calculation (min): 30 min  Short Term Goals: Week 2: SLP Short Term Goal 1 (Week 2): Pt will consume therapeutic trials of thin liquids with no overt s/s of aspiration and supervision cues for use of swallowing precautions prior to upgrade.   SLP Short Term Goal 2 (Week 2): Pt will name basic, familiar items with min assist verbal cues for >75% accuracy.  SLP Short Term Goal 3 (Week 2): Pt will follow abstract commands during functional tasks with min assist verbal and visual cues.   SLP Short Term Goal 4 (Week 2): Pt will recognize and correct verbal errors during structured tasks with mod assist verbal cues.   SLP Short Term Goal 5 (Week 2): Pt will consume dys 2 textures and nectar-thick liquids via cup with supervision cues for use of swallowing precautions.    Skilled Therapeutic Interventions: Pt was seen in am for dysphagia and language treatment in a non distracting environment. Pt participated in sentence completion tasks of varying complexity with moderate visual and verbal cues with 60% acc. Pt completed automatic speech tasks with mod-max verbal/visual cues with sequential numbers at 100%, and 40% for non-sequential order. Pt able to verbally count to 100 by tens with 60 % acc given max verbal/visual cues. Pt was observed with softer chewable solids with nectar thick liquids. Pt had one instance of cough with nectar thick when consuming the last sip of the nectar with head in an upward position. Pt was left comfortable in her bed with bed alarm set and call bell within reach.      Function:  Eating Eating   Modified Consistency Diet: Yes Eating Assist Level: Supervision or verbal cues           Cognition Comprehension Comprehension assist level: Understands basic 75 -  89% of the time/ requires cueing 10 - 24% of the time  Expression   Expression assist level: Expresses basic 25 - 49% of the time/requires cueing 50 - 75% of the time. Uses single words/gestures.  Social Interaction Social Interaction assist level: Interacts appropriately 75 - 89% of the time - Needs redirection for appropriate language or to initiate interaction.  Problem Solving Problem solving assist level: Solves basic 50 - 74% of the time/requires cueing 25 - 49% of the time  Memory Memory assist level: Recognizes or recalls 25 - 49% of the time/requires cueing 50 - 75% of the time    Pain Pain Assessment Pain Assessment: No/denies pain  Therapy/Group: Individual Therapy  Darlina Sicilian 12/16/2016, 2:58 PM

## 2016-12-16 NOTE — Progress Notes (Signed)
Occupational Therapy Session Note  Patient Details  Name: Valerie Mcclure MRN: [id number] Date of Birth: 07-16-1941  Today's Date: 12/16/2016 OT Individual Time: 4497-5300 and 1415-1500 OT Individual Time Calculation (min): 55 min and 45 min   Short Term Goals: Week 2:  OT Short Term Goal 1 (Week 2): Pt will complete bathing with AE PRN at min assist level OT Short Term Goal 2 (Week 2): Pt will complete UB dressing with min assist  OT Short Term Goal 3 (Week 2): pt will don pants with mod assist at sit > stand level OT Short Term Goal 4 (Week 2): Pt will locate items on Rt to complete self-care tasks with min cues  Skilled Therapeutic Interventions/Progress Updates:    1) Treatment session with focus on functional mobility, sit > stand, and Rt attention during self-care tasks.  Donned pants seated EOB with total assist and blocking at RLE d/t extensor tone.  Pt attempted to pull pants over hips while therapist assisting to maintain standing balance, however required assist to pull Rt side over hip.  Completed stand pivot transfer to w/c with mod assist.  Engaged in self-feeding in sitting with focus on scanning to Rt to locate juice and utensils.  Grooming completed in sitting with assist to setup for items.    2) Treatment session with focus on trunk control with sit > stand and dynamic standing.  Performed stand pivot transfer to therapy mat with mod assist, requiring manual facilitation at RLE for improved positioning.  Engaged in blocked practice of sit > stand with therapist providing tactile cues at RLE while seated in arm chair in front of pt.  Pt with improved sit > stand with min tactile cues for postural control.  Engaged in simulated LB dressing with theraband with focus on trunk control in standing while pulling theraband over hips with LUE.  Min assist for standing balance with increased time to complete task.  Engaged in visual scanning task at high-low table to complete card  matching activity, improved visual scanning to Rt with table top task.  Therapy Documentation Precautions:  Precautions Precautions: Fall Restrictions Weight Bearing Restrictions: No General:   Vital Signs: Therapy Vitals Pulse Rate: 64 Pain: Pain Assessment Pain Assessment: No/denies pain  See Function Navigator for Current Functional Status.   Therapy/Group: Individual Therapy  Simonne Come 12/16/2016, 8:37 AM

## 2016-12-17 ENCOUNTER — Inpatient Hospital Stay (HOSPITAL_COMMUNITY): Payer: Medicare Other | Admitting: Physical Therapy

## 2016-12-17 ENCOUNTER — Inpatient Hospital Stay (HOSPITAL_COMMUNITY): Payer: Medicare Other | Admitting: Occupational Therapy

## 2016-12-17 ENCOUNTER — Inpatient Hospital Stay (HOSPITAL_COMMUNITY): Payer: Medicare Other | Admitting: Speech Pathology

## 2016-12-17 MED ORDER — BACLOFEN 5 MG HALF TABLET
5.0000 mg | ORAL_TABLET | Freq: Three times a day (TID) | ORAL | Status: DC
  Administered 2016-12-17 – 2016-12-19 (×7): 5 mg via ORAL
  Filled 2016-12-17 (×8): qty 1

## 2016-12-17 NOTE — Progress Notes (Signed)
[facility] PHYSICAL MEDICINE & REHABILITATION     PROGRESS NOTE  Subjective/Complaints:  Pt seen laying in bed this AM.  She slept well overnight, per pt.  No family present.    ROS: Denies CP, SOB, N/V/D.   Objective: Vital Signs: Blood pressure (!) 140/42, pulse (!) 51, temperature 97.7 F (36.5 C), temperature source Oral, resp. rate 16, height 5\' 3"  (1.6 m), weight 72.6 kg (160 lb), SpO2 99 %. No results found. No results for input(s): WBC, HGB, HCT, PLT in the last 72 hours. No results for input(s): NA, K, CL, GLUCOSE, BUN, CREATININE, CALCIUM in the last 72 hours.  Invalid input(s): CO CBG (last 3)  No results for input(s): GLUCAP in the last 72 hours.  Wt Readings from Last 3 Encounters:  12/16/16 72.6 kg (160 lb)  11/29/16 79.5 kg (175 lb 4.3 oz)  07/31/16 74.8 kg (165 lb)    Physical Exam:  BP (!) 140/42 (BP Location: Left Arm)   Pulse (!) 51   Temp 97.7 F (36.5 C) (Oral)   Resp 16   Ht 5\' 3"  (1.6 m)   Wt 72.6 kg (160 lb)   SpO2 99%   BMI 28.34 kg/m  Constitutional: She appears well-developedand well-nourished.  HENT: Normocephalicand atraumatic.  Eyes: EOMI. No discharge.  Cardiovascular: RRR. No JVD. Respiratory: Effort normal and breath sounds normal.  GI: Soft. Bowel sounds are normal.  Musculoskeletal: She no edema and no tenderness.  Neurological: She is alertand oriented x1.  Right facial weakness with dysarthria.  Expressive >>receptive aphasia, slowly improving.  Motor RUE: 2/5 shoulder abduction, 2-/5 elbow flexion/extension, 1+/5 distally (stable) RLE: 4-/5 HF, KE, 3/5 ADF/PF (stable) Extensor tone RLE LUE/LLE: 4+/5 throughout Skin: Skin is warmand dry.  Psychiatric: She has a normal mood and affect. Her behavior is normal.    Assessment/Plan: 1. Functional deficits secondary to left MCA distribution infarct which require 3+ hours per day of interdisciplinary therapy in a comprehensive inpatient rehab setting. Physiatrist is  providing close team supervision and 24 hour management of active medical problems listed below. Physiatrist and rehab team continue to assess barriers to discharge/monitor patient progress toward functional and medical goals.  Function:  Bathing Bathing position   Position: Wheelchair/chair at sink  Bathing parts Body parts bathed by patient: Chest, Abdomen, Front perineal area, Right upper leg, Left upper leg, Right arm, Right lower leg, Left lower leg Body parts bathed by helper: Left arm, Buttocks, Back  Bathing assist Assist Level: Touching or steadying assistance(Pt > 75%)      Upper Body Dressing/Undressing Upper body dressing   What is the patient wearing?: Pull over shirt/dress     Pull over shirt/dress - Perfomed by patient: Thread/unthread left sleeve, Put head through opening, Pull shirt over trunk Pull over shirt/dress - Perfomed by helper: Thread/unthread right sleeve        Upper body assist Assist Level: Touching or steadying assistance(Pt > 75%)      Lower Body Dressing/Undressing Lower body dressing   What is the patient wearing?: Pants, Non-skid slipper socks     Pants- Performed by patient: Thread/unthread right pants leg Pants- Performed by helper: Thread/unthread right pants leg, Thread/unthread left pants leg, Pull pants up/down   Non-skid slipper socks- Performed by helper: Don/doff right sock, Don/doff left sock                  Lower body assist Assist for lower body dressing:  (Total)      Toileting Toileting  Toileting activity did not occur: No continent bowel/bladder event Toileting steps completed by patient: Performs perineal hygiene (patient attempted to perform hygiene) Toileting steps completed by helper: Adjust clothing prior to toileting, Performs perineal hygiene, Adjust clothing after toileting Toileting Assistive Devices: Grab bar or rail  Toileting assist Assist level: Two helpers   Transfers Chair/bed transfer Chair/bed  transfer activity did not occur: N/A Chair/bed transfer method: Stand pivot Chair/bed transfer assist level: Moderate assist (Pt 50 - 74%/lift or lower) Chair/bed transfer assistive device: Armrests     Locomotion Ambulation     Max distance: 40 ft Assist level: Moderate assist (Pt 50 - 74%)   Wheelchair   Type: Manual Max wheelchair distance: 40 Assist Level: Moderate assistance (Pt 50 - 74%)  Cognition Comprehension Comprehension assist level: Understands basic 75 - 89% of the time/ requires cueing 10 - 24% of the time  Expression Expression assist level: Expresses basic 25 - 49% of the time/requires cueing 50 - 75% of the time. Uses single words/gestures.  Social Interaction Social Interaction assist level: Interacts appropriately 75 - 89% of the time - Needs redirection for appropriate language or to initiate interaction.  Problem Solving Problem solving assist level: Solves basic 50 - 74% of the time/requires cueing 25 - 49% of the time  Memory Memory assist level: Recognizes or recalls 25 - 49% of the time/requires cueing 50 - 75% of the time    Medical Problem List and Plan: 1. Right hemiparesis and aphasia  secondary to left MCA distribution infarct  Cont CIR  Bracing ordered  With spasticity, baclofen 5 TID effective, but causing drowsiness per daughter, d/ced, will retrial after discussion with therapy  Plan for botulinum toxin as outpt 2. DVT Prophylaxis/Anticoagulation: Mechanical: Sequential compression devices, below knee Bilateral lower extremities 3. Chronic back pain/Pain Management: On pamelor for neuropathy and MS contin bid.  4. Mood: LCSW to follow for evaluation and support.  5. Neuropsych: This patient is not capable of making decisions on her own behalf. 6. Skin/Wound Care: routine pressure relief measures.  7. Fluids/Electrolytes/Nutrition: May need IVF at nights if unable to maintain hydration.  8.HTN: Monitor BP bid.   Avapro increased to 112.5 on  5/22  Monitor with increased mobility  Overall controlled 5/31 9. PAC's: Monitor heart rate bid. Currently in NSR.  10. Diastolic Heart failure: Asymptomatic.  Filed Weights   12/03/16 1630 12/16/16 0430  Weight: 79.8 kg (175 lb 14.4 oz) 72.6 kg (160 lb)  11.H/o anemia with multiple transfusion with ABLA:  Hb 9.3 on 5/28  Cont to monitor  Labs ordered for tomorrow 12. Depression: has been managed on Prozac.  13. CKD: Baseline SCr- 1.4 range. Continue to monitor. Check lytes serially to monitor stability.   Cr 1.14 on 5/28  Cont to monitor  D/ced IVF and monitor  Labs ordered for tomorrow 14. Prediabetes: Hgb A1C- 5.8. Educate on appropriate diet as cognition improves.   Monitor with increased mobility  Controlled 5/28 15. Dysphagia: Advanced to D2 nectar.  16. Hypoalbuminemia  Supplement initiated 5/18 17. E. Coli UTI   Bactrim completed 5/23  LOS (Days) 14 A FACE TO FACE EVALUATION WAS PERFORMED  Valerie Mcclure 12/17/2016 8:43 AM

## 2016-12-17 NOTE — Progress Notes (Signed)
Occupational Therapy Session Note  Patient Details  Name: Valerie Mcclure MRN: [id number] Date of Birth: March 14, 1941  Today's Date: 12/17/2016 OT Individual Time: 0830-0930 (60 mins)      Short Term Goals: Week 2:  OT Short Term Goal 1 (Week 2): Pt will complete bathing with AE PRN at min assist level OT Short Term Goal 2 (Week 2): Pt will complete UB dressing with min assist  OT Short Term Goal 3 (Week 2): pt will don pants with mod assist at sit > stand level OT Short Term Goal 4 (Week 2): Pt will locate items on Rt to complete self-care tasks with min cues  Skilled Therapeutic Interventions/Progress Updates:    Pt received supine in bed. Focus of session on UB/LB bathing, UB/LB dressing, and grooming ADLs. Pt completed bed mobility with MinA for scooting to EOB. Completed stand pivot transfer to w/c and stand pivot transfer w/c to/from tub bench in shower with MinA for blocking and supporting RLE, verbal cues to correct pelvic/trunk alignment while standing during transfer. Pt completed UB and LB bathing with setup to obtain items and adjust water temperature, requires increased assist for washing buttocks and bottom of LEs, and with HOH assist to facilitate washing LUE using RUE. Following bathing task Pt completed UB dressing with MinA to thread RUE through shirt, Mod verbal cues for sequencing and technique. Completed LB dressing with MinA for threading RLE through pant leg, Pt able to stand with MinA for steadying to pull up pants. Pt completed seated grooming ADLs at sink with HOH assist to facilitate using RUE during task completion. Pt left sitting up in w/c, chair alarm activated and with call bell and needs within reach. Manual facilitation at RLE with all transfers and sit > stand secondary to extensor tone.  Therapy Documentation Precautions:  Precautions Precautions: Fall Restrictions Weight Bearing Restrictions: No      Pain: Pain Assessment Pain Assessment:  Faces Faces Pain Scale: No hurt   ADL: see functional navigator           See Function Navigator for Current Functional Status.   Therapy/Group: Individual Therapy  Raymondo Band 12/17/2016, 12:11 PM

## 2016-12-17 NOTE — Progress Notes (Signed)
Speech Language Pathology Daily Session Note  Patient Details  Name: FALESHA SCHOMMER MRN: [id number] Date of Birth: 03-Jun-1941  Today's Date: 12/17/2016 SLP Individual Time: 0930-1015 SLP Individual Time Calculation (min): 45 min  Short Term Goals: Week 2: SLP Short Term Goal 1 (Week 2): Pt will consume therapeutic trials of thin liquids with no overt s/s of aspiration and supervision cues for use of swallowing precautions prior to upgrade.   SLP Short Term Goal 2 (Week 2): Pt will name basic, familiar items with min assist verbal cues for >75% accuracy.  SLP Short Term Goal 3 (Week 2): Pt will follow abstract commands during functional tasks with min assist verbal and visual cues.   SLP Short Term Goal 4 (Week 2): Pt will recognize and correct verbal errors during structured tasks with mod assist verbal cues.   SLP Short Term Goal 5 (Week 2): Pt will consume dys 2 textures and nectar-thick liquids via cup with supervision cues for use of swallowing precautions.    Skilled Therapeutic Interventions: Skilled treatment session focused on language and dysphagia goals. SLP facilitated session by providing trials of thin liquids via cup and spoon (oral care performed with OT 5 minutes earlier). Patient demonstrated overt coughing and subtle delayed throat clearing along with right anterior spillage and multiple swallows with trials with Mod verbal cues needed for use of small sips. Patient named functional items with 90% accuracy and Mod A phonemic and semantic cues and identified the function of the item at the phrase level with 100% accuracy and Mod A sentence completion cues. Patient also matched a written phrase to an item from a field of 2 with 80% accuracy. Patient left upright in wheelchair with husband and RN present. Continue with current plan of care.      Function:  Eating Eating   Modified Consistency Diet: No (with SLP ) Eating Assist Level: Supervision or verbal cues            Cognition Comprehension Comprehension assist level: Understands basic 75 - 89% of the time/ requires cueing 10 - 24% of the time  Expression   Expression assist level: Expresses basic 25 - 49% of the time/requires cueing 50 - 75% of the time. Uses single words/gestures.  Social Interaction Social Interaction assist level: Interacts appropriately 75 - 89% of the time - Needs redirection for appropriate language or to initiate interaction.  Problem Solving Problem solving assist level: Solves basic 50 - 74% of the time/requires cueing 25 - 49% of the time  Memory Memory assist level: Recognizes or recalls 25 - 49% of the time/requires cueing 50 - 75% of the time    Pain No/Denies Pain   Therapy/Group: Individual Therapy  Jahayra Mazo 12/17/2016, 11:49 AM

## 2016-12-17 NOTE — Progress Notes (Signed)
Social Work Patient ID: Valerie Mcclure, female   DOB: 08/28/1940, 76 y.o.   MRN: [id number]  Met with husband and pt to discuss team conference progress toward her min assist level goals. Husband reports he observed her in PT today and was Amazed how well she is moving, he was not aware how well she is moving. Made aware next week will need to do actual hands on care-teaching prior to discharge. He will do this along with pt's daughter. Will continue to work On discharge needs and aware target discharge 6/7.

## 2016-12-17 NOTE — Progress Notes (Signed)
Physical Therapy Note  Patient Details  Name: Valerie Mcclure MRN: [id number] Date of Birth: 1940/12/17 Today's Date: 12/17/2016    Time: 1030-1155 85 minutes  1:1 No c/o pain.  Pt performed gait trials 7 x 30' with turns.  Initially with Rt UE over PT's shoulder with min/mod A.  Then trial of SPC with Rt UE over PTs shoulder with min A.  Progressing to [facility] with min A with ace wrap to prevent toe drag on Rt LE.  Pt much improved with gait and balance this session.  Sit to stand blocked practice with focus on reducing Rt knee extensor tone with min A to block Rt LE.  Standing squat and reach task to promote Rt hip and knee flexion with min/mod manual facilitation.  Nu step for LE strength and coordination x 7 minutes level 3 with 3 rest breaks throughout.  Seated wt shifts and reaching to promote pelvic alignment and control with mod/max manual cuing.  Furniture transfer to low couch with mod  A required to stand from couch.  Pt fatigued due to 2 previous therapies but participated well and improved gait and functional mobility.   DONAWERTH,KAREN 12/17/2016, 11:59 AM

## 2016-12-17 NOTE — Plan of Care (Signed)
Problem: RH PAIN MANAGEMENT Goal: RH STG PAIN MANAGED AT OR BELOW PT'S PAIN GOAL <2 on a 0-10 pain scale  Outcome: Progressing Will be within 2 points of 1 to 10 pain scale

## 2016-12-18 ENCOUNTER — Inpatient Hospital Stay (HOSPITAL_COMMUNITY): Payer: Medicare Other | Admitting: Speech Pathology

## 2016-12-18 ENCOUNTER — Inpatient Hospital Stay (HOSPITAL_COMMUNITY): Payer: Medicare Other | Admitting: Occupational Therapy

## 2016-12-18 ENCOUNTER — Inpatient Hospital Stay (HOSPITAL_COMMUNITY): Payer: Medicare Other | Admitting: Physical Therapy

## 2016-12-18 LAB — BASIC METABOLIC PANEL
ANION GAP: 10 (ref 5–15)
BUN: 30 mg/dL — ABNORMAL HIGH (ref 6–20)
CALCIUM: 8.9 mg/dL (ref 8.9–10.3)
CO2: 26 mmol/L (ref 22–32)
Chloride: 101 mmol/L (ref 101–111)
Creatinine, Ser: 1.18 mg/dL — ABNORMAL HIGH (ref 0.44–1.00)
GFR, EST AFRICAN AMERICAN: 50 mL/min — AB (ref >60)
GFR, EST NON AFRICAN AMERICAN: 43 mL/min — AB (ref >60)
Glucose, Bld: 89 mg/dL (ref 65–99)
POTASSIUM: 4.9 mmol/L (ref 3.5–5.1)
Sodium: 137 mmol/L (ref 135–145)

## 2016-12-18 LAB — CBC WITH DIFFERENTIAL/PLATELET
BASOS ABS: 0 10*3/uL (ref 0.0–0.1)
BASOS PCT: 0 %
Eosinophils Absolute: 0.1 10*3/uL (ref 0.0–0.7)
Eosinophils Relative: 2 %
HEMATOCRIT: 35.2 % — AB (ref 36.0–46.0)
HEMOGLOBIN: 11.1 g/dL — AB (ref 12.0–15.0)
LYMPHS PCT: 29 %
Lymphs Abs: 2.4 10*3/uL (ref 0.7–4.0)
MCH: 29.2 pg (ref 26.0–34.0)
MCHC: 31.5 g/dL (ref 30.0–36.0)
MCV: 92.6 fL (ref 78.0–100.0)
Monocytes Absolute: 0.3 10*3/uL (ref 0.1–1.0)
Monocytes Relative: 4 %
NEUTROS ABS: 5.2 10*3/uL (ref 1.7–7.7)
NEUTROS PCT: 65 %
Platelets: 316 10*3/uL (ref 150–400)
RBC: 3.8 MIL/uL — ABNORMAL LOW (ref 3.87–5.11)
RDW: 14.3 % (ref 11.5–15.5)
WBC: 8.1 10*3/uL (ref 4.0–10.5)

## 2016-12-18 NOTE — Progress Notes (Signed)
Occupational Therapy Session Note  Patient Details  Name: Valerie Mcclure MRN: [id number] Date of Birth: 1940/08/03  Today's Date: 12/18/2016 OT Individual Time: 1751-0258 OT Individual Time Calculation (min): 31 min    Short Term Goals: Week 3:  OT Short Term Goal 1 (Week 3): Pt will locate items on Rt to complete self-care tasks with min cues OT Short Term Goal 2 (Week 3): STG = LTGs due to remaining LOS  Skilled Therapeutic Interventions/Progress Updates:    Tx focus on endurance, standing balance, and R UE NMR, during self care tasks.   Pt greeted in w/c with NT present. Pt preparing to complete toilet transfer. Pt completed stand pivot transfer to toilet with Mod A, OT supporting R LE. Pt completed hygiene in standing with right hand on grab bar for weightbearing. Pts right hand slid between grab bar and wall and got stuck. Pt standing for 2 minutes for OT to gently remove it. LB dressing completed while on toilet. With extra time provided and instruction on adaptive techniques, pt was unable to do 1/3 components without physical assist. Pt then returned to w/c. She completed handwashing/hair brushing at sink in standing for R UE weight bearing (Mod A sit<stand). Pt locating paper towels on Rt without cues.  Had her word find ADL items (i.e. Socks, shoes, shirt, pants) with tactile prompts. Pt with 50% recall of item names 30 seconds after we reviewed them. Pt left in w/c with safety belt donned, half lap tray, and all needs within reach.   Therapy Documentation Precautions:  Precautions Precautions: Fall Restrictions Weight Bearing Restrictions: No  Pain: No c/o pain during tx  Pain Assessment Pain Assessment: No/denies pain Pain Score: 0-No pain Faces Pain Scale: No hurt Patients Stated Pain Goal: 0 PAINAD (Pain Assessment in Advanced Dementia) Breathing: normal Negative Vocalization: none Facial Expression: smiling or inexpressive Body Language: relaxed Consolability:  no need to console PAINAD Score: 0 ADL:      See Function Navigator for Current Functional Status.   Therapy/Group: Individual Therapy  Storm Sovine A Kelechi Orgeron 12/18/2016, 12:26 PM

## 2016-12-18 NOTE — Progress Notes (Signed)
Speech Language Pathology Weekly Progress and Session Note  Patient Details  Name: Valerie Mcclure MRN: [id number] Date of Birth: 09/13/1940  Beginning of progress report period: Dec 11, 2016 End of progress report period: December 18, 2016  Today's Date: 12/18/2016 SLP Individual Time: 1000-1045 SLP Individual Time Calculation (min): 45 min  Short Term Goals: Week 2: SLP Short Term Goal 1 (Week 2): Pt will consume therapeutic trials of thin liquids with no overt s/s of aspiration and supervision cues for use of swallowing precautions prior to upgrade.   SLP Short Term Goal 1 - Progress (Week 2): Not met SLP Short Term Goal 2 (Week 2): Pt will name basic, familiar items with min assist verbal cues for >75% accuracy.  SLP Short Term Goal 2 - Progress (Week 2): Not met SLP Short Term Goal 3 (Week 2): Pt will follow abstract commands during functional tasks with min assist verbal and visual cues.   SLP Short Term Goal 3 - Progress (Week 2): Not met SLP Short Term Goal 4 (Week 2): Pt will recognize and correct verbal errors during structured tasks with mod assist verbal cues.   SLP Short Term Goal 4 - Progress (Week 2): Met SLP Short Term Goal 5 (Week 2): Pt will consume dys 2 textures and nectar-thick liquids via cup with supervision cues for use of swallowing precautions.   SLP Short Term Goal 5 - Progress (Week 2): Met    New Short Term Goals: Week 3: SLP Short Term Goal 1 (Week 3): Pt will consume therapeutic trials of thin liquids with no overt s/s of aspiration and supervision cues for use of swallowing precautions prior to upgrade.   SLP Short Term Goal 2 (Week 3): Pt will name basic, familiar items with min assist verbal cues for >75% accuracy.  SLP Short Term Goal 3 (Week 3): Pt will follow abstract commands during functional tasks with min assist verbal and visual cues.   SLP Short Term Goal 4 (Week 3): Pt will recognize and correct verbal errors during structured tasks with min  assist verbal cues.   SLP Short Term Goal 5 (Week 3): Pt will consume dys 2 textures and nectar-thick liquids via cup with Mod I for use of swallowing precautions.    Weekly Progress Updates: Patient has made functional gains this reporting period and has met 2 out of 5 short term goals. Patient is currently consuming Dys. 2 textures with nectar-thick liquids with minimal overt s/s of aspiration and supervision verbal cues for use of swallowing compensatory strategies. Patient is also consuming trials of thin liquids with intermittent overt s/s of aspiration. Therefore, patient not ready for upgrade.  Patient is currently mod-max assist for functional communication due to global aphasia and apraxia.  While patient continues to  demonstrate improved awareness of her verbal deficits, she still needs overall Mod-Max assist multimodal cues to correct errors which increases her frustration during communication attempts.  Patient and family education is ongoing. Patient would continue to benefit from skilled SLP intervention in order to maximize functional independence and reduce burden of care prior to discharge.       Intensity: Minumum of 1-2 x/day, 30 to 90 minutes Frequency: 3 to 5 out of 7 days Duration/Length of Stay: 12/24/16 Treatment/Interventions: Cognitive remediation/compensation;Cueing hierarchy;Dysphagia/aspiration precaution training;Environmental controls;Functional tasks;Internal/external aids;Patient/family education;Speech/Language facilitation   Daily Session  Skilled Therapeutic Interventions:  Skilled treatment session focused on language and dysphagia goals. SLP facilitated session by providing Mod A phonemic and semantic cues for naming  functional items with 90% accuracy. Patient also demonstrated increased verbal expression while singing familiar hymns. Patient consumed trials of thin liquids with Min A verbal cues for use of small sips with overt s/s of aspiration in 25% of trials.  Recommend continued trials with SLP. Patient left upright in wheelchair with quick release belt in place and all needs within reach. Continue with current plan of care.       Function:   Eating Eating   Modified Consistency Diet: No (with SLP) Eating Assist Level: Supervision or verbal cues           Cognition Comprehension Comprehension assist level: Understands basic 75 - 89% of the time/ requires cueing 10 - 24% of the time  Expression   Expression assist level: Expresses basic 25 - 49% of the time/requires cueing 50 - 75% of the time. Uses single words/gestures.  Social Interaction Social Interaction assist level: Interacts appropriately 75 - 89% of the time - Needs redirection for appropriate language or to initiate interaction.  Problem Solving Problem solving assist level: Solves basic 25 - 49% of the time - needs direction more than half the time to initiate, plan or complete simple activities  Memory Memory assist level: Recognizes or recalls 25 - 49% of the time/requires cueing 50 - 75% of the time   Pain Pain Assessment Pain Assessment: No/denies pain  Therapy/Group: Individual Therapy  Moosa Bueche 12/18/2016, 4:15 PM

## 2016-12-18 NOTE — Progress Notes (Signed)
Occupational Therapy Weekly Progress Note  Patient Details  Name: Valerie Mcclure MRN: [id number] Date of Birth: 20-Aug-1940  Beginning of progress report period: Dec 11, 2016 End of progress report period: December 18, 2016   Patient has met 3 of 4 short term goals.  Pt is making steady progress towards goals.  Pt currently requires mod assist with stand pivot transfers due to RLE extensor tone with sit > stand and transitional movements.  Pt continues to demonstrate Rt inattention during functional tasks and inattention to RUE.  Min assist with UB dressing with cues to for orientation of clothing and hemi-dressing technique.  Continues to require mod-max assist for LB dressing due to decreased trunk control and extensor tone.    Patient continues to demonstrate the following deficits: muscle weakness, decreased cardiorespiratoy endurance, impaired timing and sequencing, unbalanced muscle activation and decreased coordination, decreased attention to right, decreased attention, decreased awareness, decreased problem solving, decreased memory and delayed processing and decreased sitting balance, decreased standing balance, decreased postural control, hemiplegia and decreased balance strategies and therefore will continue to benefit from skilled OT intervention to enhance overall performance with BADL and Reduce care partner burden.  Patient progressing toward long term goals..  Continue plan of care.  OT Short Term Goals Week 2:  OT Short Term Goal 1 (Week 2): Pt will complete bathing with AE PRN at min assist level OT Short Term Goal 1 - Progress (Week 2): Met OT Short Term Goal 2 (Week 2): Pt will complete UB dressing with min assist  OT Short Term Goal 2 - Progress (Week 2): Met OT Short Term Goal 3 (Week 2): pt will don pants with mod assist at sit > stand level OT Short Term Goal 3 - Progress (Week 2): Met OT Short Term Goal 4 (Week 2): Pt will locate items on Rt to complete self-care tasks  with min cues OT Short Term Goal 4 - Progress (Week 2): Progressing toward goal Week 3:  OT Short Term Goal 1 (Week 3): Pt will locate items on Rt to complete self-care tasks with min cues OT Short Term Goal 2 (Week 3): STG = LTGs due to remaining LOS   See Function Navigator for Current Functional Status.   Therapy/Group: Individual Therapy  Valerie Mcclure, Simpson 12/18/2016, 10:20 AM

## 2016-12-18 NOTE — Progress Notes (Signed)
Occupational Therapy Session Note  Patient Details  Name: Valerie Mcclure MRN: [id number] Date of Birth: 06-22-1941  Today's Date: 12/18/2016 OT Individual Time: 0905-1000 OT Individual Time Calculation (min): 55 min    Short Term Goals: Week 2:  OT Short Term Goal 1 (Week 2): Pt will complete bathing with AE PRN at min assist level OT Short Term Goal 1 - Progress (Week 2): Met OT Short Term Goal 2 (Week 2): Pt will complete UB dressing with min assist  OT Short Term Goal 2 - Progress (Week 2): Met OT Short Term Goal 3 (Week 2): pt will don pants with mod assist at sit > stand level OT Short Term Goal 3 - Progress (Week 2): Met OT Short Term Goal 4 (Week 2): Pt will locate items on Rt to complete self-care tasks with min cues OT Short Term Goal 4 - Progress (Week 2): Progressing toward goal  Skilled Therapeutic Interventions/Progress Updates:    Pt presented supine in bed with spouse in room, having completed UB dressing with assist from spouse. Focus of session on LB dressing and increasing attention to R side during ADLs and therapeutic activities. Pt completed LB dressing seated EOB with MaxA to thread LEs through pants. ModA for sit to stand transfer and ModA to facilitate standing posture and maintain dynamic standing balance while pulling up pants. Increased assist this session to perform LB dressing task as Pt with greater difficulty maintaining trunk control and dynamic balance during unsupported sitting (versus supported). Pt completed stand pivot transfer EOB to w/c with ModA to complete seated grooming ADLs at sink. Pt completed grooming ADLs with setup and MinA to open containers. Facilitated use of RUE during grooming task with HOH assist to open and setup grooming items. Requires Mod-Max verbal cues for locating items on R side of sink to complete grooming tasks. Pt completed scanning activity locating words and shapes in book in Pt's room. Pt requires Max verbal and visual cues  to locate items on R side of book. Session ended with Pt sitting up in w/c, spouse present in room, and with handoff to SLP.    Therapy Documentation Precautions:  Precautions Precautions: Fall Restrictions Weight Bearing Restrictions: No      Pain: Pain Assessment Pain Assessment: Faces Pain Score: Asleep Faces Pain Scale: No hurt ADL: see functional navigator           See Function Navigator for Current Functional Status.   Therapy/Group: Individual Therapy  Lou Cal, OT Pager [phone] 12/18/2016  Raymondo Band 12/18/2016, 10:52 AM

## 2016-12-18 NOTE — Progress Notes (Signed)
Physical Therapy Weekly Progress Note  Patient Details  Name: Valerie Mcclure MRN: [id number] Date of Birth: 02-04-1941  Beginning of progress report period: Dec 11, 2016 End of progress report period: December 18, 2016  Patient has met 2 of 2 short term goals.  Pt with much improved mobility and gait.  Working towards consistently performing gait and transfers with min A.  Continues with deficits in posture, increased tone in Rt LE, awareness, balance, mm activation.  Patient continues to demonstrate the following deficits muscle weakness, abnormal tone, unbalanced muscle activation, motor apraxia, decreased coordination and decreased motor planning, decreased awareness and delayed processing and decreased sitting balance, decreased standing balance, decreased postural control, hemiplegia and decreased balance strategies and therefore will continue to benefit from skilled PT intervention to increase functional independence with mobility.  Patient not progressing toward long term goals.  See goal revision..  Continue plan of care.  PT Short Term Goals Week 2:  PT Short Term Goal 1 (Week 2): Pt will consistently perform functional transfers at mod A level PT Short Term Goal 1 - Progress (Week 2): Met PT Short Term Goal 2 (Week 2): Pt will perform gait 9' with mod A in controlled environment PT Short Term Goal 2 - Progress (Week 2): Met Week 3:  PT Short Term Goal 1 (Week 3): STG=LTG  Skilled Therapeutic Interventions/Progress Updates:  Ambulation/gait training;Cognitive remediation/compensation;DME/adaptive equipment instruction;Discharge planning;Functional mobility training;Pain management;Splinting/orthotics;Therapeutic Activities;UE/LE Strength taining/ROM;Wheelchair propulsion/positioning;Therapeutic Exercise;UE/LE Coordination activities;Stair training;Patient/family education;Neuromuscular re-education;Functional electrical stimulation;Community reintegration;Balance/vestibular training       See Function Navigator for Current Functional Status.   Wilhemenia Camba 12/18/2016, 8:20 AM

## 2016-12-18 NOTE — Discharge Instructions (Signed)
Inpatient Rehab Discharge Instructions  Valerie Mcclure Discharge date and time:  12/19/16  Activities/Precautions/ Functional Status: Activity: no lifting, driving, or strenuous exercise  till cleared by MD. Diet: Heart Healthy. Diabetic diet Wound Care: Apply santyl to area on head and keep damp dressing.   Functional status:  ___ No restrictions     ___ Walk up steps independently _X__ 24/7 supervision/assistance   ___ Walk up steps with assistance ___ Intermittent supervision/assistance  ___ Bathe/dress independently ___ Walk with walker     ___ Bathe/dress with assistance ___ Walk Independently    ___ Shower independently ___ Walk with assistance    ___ Shower with assistance __X_ No alcohol     ___ Return to work/school ________  Special Instructions:  STROKE/TIA DISCHARGE INSTRUCTIONS SMOKING Cigarette smoking nearly doubles your risk of having a stroke & is the single most alterable risk factor  If you smoke or have smoked in the last 12 months, you are advised to quit smoking for your health.  Most of the excess cardiovascular risk related to smoking disappears within a year of stopping.  Ask you doctor about anti-smoking medications  [place] Quit Line: 1-800-QUIT NOW  Free Smoking Cessation Classes [phone]  CHOLESTEROL Know your levels; limit fat & cholesterol in your diet  Lipid Panel     Component Value Date/Time   CHOL 162 11/30/2016 0329   TRIG 175 (H) 11/30/2016 0329   HDL 42 11/30/2016 0329   CHOLHDL 3.9 11/30/2016 0329   VLDL 35 11/30/2016 0329   LDLCALC 85 11/30/2016 0329      Many patients benefit from treatment even if their cholesterol is at goal.  Goal: Total Cholesterol (CHOL) less than 160  Goal:  Triglycerides (TRIG) less than 150  Goal:  HDL greater than 40  Goal:  LDL (LDLCALC) less than 100   BLOOD PRESSURE American Stroke Association blood pressure target is less that 120/80 mm/Hg  Your discharge blood pressure is:  BP: (!)  152/71  Monitor your blood pressure  Limit your salt and alcohol intake  Many individuals will require more than one medication for high blood pressure  DIABETES (A1c is a blood sugar average for last 3 months) Goal HGBA1c is under 7% (HBGA1c is blood sugar average for last 3 months)  Diabetes:     Lab Results  Component Value Date   HGBA1C 5.8 (H) 11/30/2016     Your HGBA1c can be lowered with medications, healthy diet, and exercise.  Check your blood sugar as directed by your physician  Call your physician if you experience unexplained or low blood sugars.  PHYSICAL ACTIVITY/REHABILITATION Goal is 30 minutes at least 4 days per week  Activity: No driving, Therapies: see above Return to work: N/A  Activity decreases your risk of heart attack and stroke and makes your heart stronger.  It helps control your weight and blood pressure; helps you relax and can improve your mood.  Participate in a regular exercise program.  Talk with your doctor about the best form of exercise for you (dancing, walking, swimming, cycling).  DIET/WEIGHT Goal is to maintain a healthy weight  Your discharge diet is: DIET - DYS 1 Room service appropriate? Yes; Fluid consistency: Honey Thick  liquids Your height is:  Height: 5\' 3"  (160 cm) Your current weight is: Weight: 79.8 kg (175 lb 14.4 oz) Your Body Mass Index (BMI) is:  BMI (Calculated): 31.2  Following the type of diet specifically designed for you will help prevent another  stroke.  Your goal weight  is:  Your goal Body Mass Index (BMI) is 19-24.  Healthy food habits can help reduce 3 risk factors for stroke:  High cholesterol, hypertension, and excess weight.  RESOURCES Stroke/Support Group:  Call [phone]   STROKE EDUCATION PROVIDED/REVIEWED AND GIVEN TO PATIENT Stroke warning signs and symptoms How to activate emergency medical system (call 911). Medications prescribed at discharge. Need for follow-up after discharge. Personal risk  factors for stroke. Pneumonia vaccine given:  Flu vaccine given:  My questions have been answered, the writing is legible, and I understand these instructions.  I will adhere to these goals & educational materials that have been provided to me after my discharge from the hospital.     My questions have been answered and I understand these instructions. I will adhere to these goals and the provided educational materials after my discharge from the hospital.  Patient/Caregiver Signature _______________________________ Date __________  Clinician Signature _______________________________________ Date __________  Please bring this form and your medication list with you to all your follow-up doctor's appointments.

## 2016-12-18 NOTE — Progress Notes (Signed)
[facility] PHYSICAL MEDICINE & REHABILITATION     PROGRESS NOTE  Subjective/Complaints:  Sitting up in bed. Denies pain. Feels well  ROS: limited due to language/communication  Objective: Vital Signs: Blood pressure (!) 145/66, pulse (!) 52, temperature 98 F (36.7 C), temperature source Oral, resp. rate 16, height 5\' 3"  (1.6 m), weight 72.6 kg (160 lb), SpO2 100 %. No results found. No results for input(s): WBC, HGB, HCT, PLT in the last 72 hours. No results for input(s): NA, K, CL, GLUCOSE, BUN, CREATININE, CALCIUM in the last 72 hours.  Invalid input(s): CO CBG (last 3)  No results for input(s): GLUCAP in the last 72 hours.  Wt Readings from Last 3 Encounters:  12/16/16 72.6 kg (160 lb)  11/29/16 79.5 kg (175 lb 4.3 oz)  07/31/16 74.8 kg (165 lb)    Physical Exam:  BP (!) 145/66 (BP Location: Left Arm)   Pulse (!) 52   Temp 98 F (36.7 C) (Oral)   Resp 16   Ht 5\' 3"  (1.6 m)   Wt 72.6 kg (160 lb)   SpO2 100%   BMI 28.34 kg/m  Constitutional: She appears well-developedand well-nourished.  HENT: Normocephalicand atraumatic.  Eyes: EOMI. No discharge.  Cardiovascular: RRR without jvd. Respiratory:cta b.  GI: Soft. Bowel sounds are normal.  Musculoskeletal: She no edema and no tenderness.  Neurological: She is alertand oriented x1.  Right facial weakness with dysarthria.  Expressive >>receptive aphasia, slowly improving. Can communicate in short sentences and phrases Motor RUE: 2/5 shoulder abduction, 2/5 elbow flexion/extension, 1+/5 HI  RLE: 4-/5 HF, KE, 3+/5 ADF/PF   Extensor tone RLE LUE/LLE: 4+/5 throughout Skin: Skin is warmand dry.  Psychiatric: She has a normal mood and affect. Her behavior is normal.    Assessment/Plan: 1. Functional deficits secondary to left MCA distribution infarct which require 3+ hours per day of interdisciplinary therapy in a comprehensive inpatient rehab setting. Physiatrist is providing close team supervision and 24 hour  management of active medical problems listed below. Physiatrist and rehab team continue to assess barriers to discharge/monitor patient progress toward functional and medical goals.  Function:  Bathing Bathing position   Position: Shower  Bathing parts Body parts bathed by patient: Chest, Abdomen, Front perineal area, Right upper leg, Left upper leg, Right arm Body parts bathed by helper: Left arm, Buttocks, Right lower leg, Left lower leg  Bathing assist Assist Level:  (Mod assist)      Upper Body Dressing/Undressing Upper body dressing   What is the patient wearing?: Pull over shirt/dress     Pull over shirt/dress - Perfomed by patient: Thread/unthread left sleeve, Put head through opening, Pull shirt over trunk Pull over shirt/dress - Perfomed by helper: Thread/unthread right sleeve        Upper body assist Assist Level: Touching or steadying assistance(Pt > 75%)      Lower Body Dressing/Undressing Lower body dressing   What is the patient wearing?: Pants, Non-skid slipper socks     Pants- Performed by patient: Thread/unthread left pants leg, Pull pants up/down Pants- Performed by helper: Thread/unthread right pants leg   Non-skid slipper socks- Performed by helper: Don/doff right sock, Don/doff left sock                  Lower body assist Assist for lower body dressing:  (Max assist)      Toileting Toileting Toileting activity did not occur: No continent bowel/bladder event Toileting steps completed by patient: Performs perineal hygiene (patient attempted to  perform hygiene) Toileting steps completed by helper: Adjust clothing prior to toileting, Performs perineal hygiene, Adjust clothing after toileting Toileting Assistive Devices: Grab bar or rail  Toileting assist Assist level: Two helpers   Transfers Chair/bed transfer Chair/bed transfer activity did not occur: N/A Chair/bed transfer method: Stand pivot Chair/bed transfer assist level: Moderate assist  (Pt 50 - 74%/lift or lower) Chair/bed transfer assistive device: Armrests     Locomotion Ambulation     Max distance: 40 ft Assist level: Moderate assist (Pt 50 - 74%)   Wheelchair   Type: Manual Max wheelchair distance: 40 Assist Level: Moderate assistance (Pt 50 - 74%)  Cognition Comprehension Comprehension assist level: Understands basic 75 - 89% of the time/ requires cueing 10 - 24% of the time  Expression Expression assist level: Expresses basic 25 - 49% of the time/requires cueing 50 - 75% of the time. Uses single words/gestures.  Social Interaction Social Interaction assist level: Interacts appropriately 75 - 89% of the time - Needs redirection for appropriate language or to initiate interaction.  Problem Solving Problem solving assist level: Solves basic 50 - 74% of the time/requires cueing 25 - 49% of the time  Memory Memory assist level: Recognizes or recalls 25 - 49% of the time/requires cueing 50 - 75% of the time    Medical Problem List and Plan: 1. Right hemiparesis and aphasia  secondary to left MCA distribution infarct  Cont CIR  Bracing ordered  Baclofen stopped due to sedation  Plan for botulinum toxin as outpt 2. DVT Prophylaxis/Anticoagulation: Mechanical: Sequential compression devices, below knee Bilateral lower extremities 3. Chronic back pain/Pain Management: On pamelor for neuropathy and MS contin bid.  4. Mood: LCSW to follow for evaluation and support.  5. Neuropsych: This patient is not capable of making decisions on her own behalf. 6. Skin/Wound Care: routine pressure relief measures.  7. Fluids/Electrolytes/Nutrition: May need IVF at nights if unable to maintain hydration.  8.HTN: Monitor BP bid.   Avapro increased to 112.5 on 5/22  Monitor with increased mobility  Improved control 9. PAC's: Monitor heart rate bid. Currently in NSR.  10. Diastolic Heart failure: Asymptomatic.  Filed Weights   12/03/16 1630 12/16/16 0430  Weight: 79.8 kg  (175 lb 14.4 oz) 72.6 kg (160 lb)  11.H/o anemia with multiple transfusion with ABLA:  Hb 9.3 on 5/28  Cont to monitor  Labs pending for today 12. Depression: has been managed on Prozac.  13. CKD: Baseline SCr- 1.4 range. Continue to monitor. Check lytes serially to monitor stability.   Cr 1.14 on 5/28  Cont to monitor  D/ced IVF and monitor  Labs pending 14. Prediabetes: Hgb A1C- 5.8. Educate on appropriate diet as cognition improves.   Monitor with increased mobility  Controlled 5/28 15. Dysphagia: Advanced to D2 nectar.  16. Hypoalbuminemia  Supplement initiated 5/18 17. E. Coli UTI   Bactrim completed 5/23  LOS (Days) 15 A FACE TO FACE EVALUATION WAS PERFORMED  Carl Bleecker T 12/18/2016 9:28 AM

## 2016-12-18 NOTE — Progress Notes (Signed)
Physical Therapy Note  Patient Details  Name: Valerie Mcclure MRN: [id number] Date of Birth: 1941/06/24 Today's Date: 12/18/2016    Time: 1445-1510 25 minutes  1:1 No c/o pain.  Session focused on gait training with SPC and ace wrap for Rt LE to prevent foot drop.  Gait 100' with min A on tile and carpet surfaces with SPC.  Gait on carpet 30', 15' with mod A especially when more fatigued.  Furniture transfer from Pension scheme manager with min A, from static recliner with min A.  Pt's daughter present for session and was educated on reason for PT recommendation of AFO consult, as well as recommendation for 2 days of family ed prior to d/c, daughter verbalizes agreement and understanding.   [name] 12/18/2016, 3:20 PM

## 2016-12-18 NOTE — Progress Notes (Signed)
Occupational Therapy Session Note  Patient Details  Name: Valerie Mcclure MRN: [id number] Date of Birth: 08/30/1940  Today's Date: 12/18/2016 OT Individual Time: 7017-7939 OT Individual Time Calculation (min): 46 min    Short Term Goals: Week 2:  OT Short Term Goal 1 (Week 2): Pt will complete bathing with AE PRN at min assist level OT Short Term Goal 1 - Progress (Week 2): Met OT Short Term Goal 2 (Week 2): Pt will complete UB dressing with min assist  OT Short Term Goal 2 - Progress (Week 2): Met OT Short Term Goal 3 (Week 2): pt will don pants with mod assist at sit > stand level OT Short Term Goal 3 - Progress (Week 2): Met OT Short Term Goal 4 (Week 2): Pt will locate items on Rt to complete self-care tasks with min cues OT Short Term Goal 4 - Progress (Week 2): Progressing toward goal  Skilled Therapeutic Interventions/Progress Updates:    Arrived to Pt's room for session with Pt sitting up in w/c, daughter present. OT session took place in therapy gym with focus on LB dressing, dynamic sitting and standing balance to increase stability and independence for ADL completion. Pt completed stand pivot transfer to/from w/c to mat table during session with ModA. Able to doff socks while sitting on mat table with MinA for steadying during task completion. Pt requires MaxA to don socks in unsupported sitting, with Pt trialing donning socks using figure 4 technique and bending towards LEs. Pt demonstrates fair dynamic sitting balance picking up cups placed in front of Pt on floor and on either side of Pt on mat with MinA for steadying during activity completion. Pt completed sit to stand with ModA x3 times during session for participation in dynamic standing therapeutic activities. Pt demonstrates ability to locate and obtain items on table with items placed towards R side of table, remove numbers from mirror placed in front of Pt and placing them on mirror in correct order, and remove days of  the week from R side of mirror and replacing in correct order on mirror. Pt able to complete activities with ModA to maintain dynamic standing balance during task completion secondary to posterior lean (R side > L side) and with intermittent rest breaks. Pt left in room, quick release belt, R hand splint, and lab board in place, call bell and needs within reach, and husband and daughter present.   Therapy Documentation Precautions:  Precautions Precautions: Fall Restrictions Weight Bearing Restrictions: No      Pain: Pain Assessment Pain Assessment: No/denies pain Pain Score: 0-No pain Faces Pain Scale: No hurt Patients Stated Pain Goal: 0 PAINAD (Pain Assessment in Advanced Dementia) Breathing: normal Negative Vocalization: none Facial Expression: smiling or inexpressive Body Language: relaxed Consolability: no need to console PAINAD Score: 0 ADL:         :    See Function Navigator for Current Functional Status.   Therapy/Group: Individual Therapy  Lou Cal, OT Pager [phone] 12/18/2016  Raymondo Band 12/18/2016, 2:26 PM

## 2016-12-19 ENCOUNTER — Encounter (HOSPITAL_COMMUNITY): Payer: Self-pay

## 2016-12-19 ENCOUNTER — Inpatient Hospital Stay (HOSPITAL_COMMUNITY): Payer: Medicare Other | Admitting: Occupational Therapy

## 2016-12-19 ENCOUNTER — Inpatient Hospital Stay (HOSPITAL_COMMUNITY)
Admission: AD | Admit: 2016-12-19 | Discharge: 2016-12-23 | DRG: 917 | Disposition: A | Discharge status: Rehabilitation Facility | Payer: Medicare Other | Source: Ambulatory Visit | Attending: Internal Medicine | Admitting: Internal Medicine

## 2016-12-19 ENCOUNTER — Inpatient Hospital Stay (HOSPITAL_COMMUNITY): Payer: Medicare Other

## 2016-12-19 ENCOUNTER — Inpatient Hospital Stay (HOSPITAL_COMMUNITY): Payer: Medicare Other | Admitting: Speech Pathology

## 2016-12-19 DIAGNOSIS — N182 Chronic kidney disease, stage 2 (mild): Secondary | ICD-10-CM | POA: Diagnosis present

## 2016-12-19 DIAGNOSIS — E669 Obesity, unspecified: Secondary | ICD-10-CM | POA: Diagnosis present

## 2016-12-19 DIAGNOSIS — E785 Hyperlipidemia, unspecified: Secondary | ICD-10-CM | POA: Diagnosis not present

## 2016-12-19 DIAGNOSIS — I639 Cerebral infarction, unspecified: Secondary | ICD-10-CM

## 2016-12-19 DIAGNOSIS — E038 Other specified hypothyroidism: Secondary | ICD-10-CM | POA: Diagnosis not present

## 2016-12-19 DIAGNOSIS — I4892 Unspecified atrial flutter: Secondary | ICD-10-CM | POA: Diagnosis present

## 2016-12-19 DIAGNOSIS — Z9049 Acquired absence of other specified parts of digestive tract: Secondary | ICD-10-CM | POA: Diagnosis not present

## 2016-12-19 DIAGNOSIS — J9601 Acute respiratory failure with hypoxia: Secondary | ICD-10-CM | POA: Diagnosis not present

## 2016-12-19 DIAGNOSIS — I4891 Unspecified atrial fibrillation: Secondary | ICD-10-CM | POA: Diagnosis not present

## 2016-12-19 DIAGNOSIS — R001 Bradycardia, unspecified: Secondary | ICD-10-CM | POA: Diagnosis not present

## 2016-12-19 DIAGNOSIS — I69391 Dysphagia following cerebral infarction: Secondary | ICD-10-CM | POA: Diagnosis not present

## 2016-12-19 DIAGNOSIS — R739 Hyperglycemia, unspecified: Secondary | ICD-10-CM | POA: Diagnosis present

## 2016-12-19 DIAGNOSIS — D649 Anemia, unspecified: Secondary | ICD-10-CM | POA: Diagnosis not present

## 2016-12-19 DIAGNOSIS — R4701 Aphasia: Secondary | ICD-10-CM | POA: Diagnosis not present

## 2016-12-19 DIAGNOSIS — I130 Hypertensive heart and chronic kidney disease with heart failure and stage 1 through stage 4 chronic kidney disease, or unspecified chronic kidney disease: Secondary | ICD-10-CM | POA: Diagnosis not present

## 2016-12-19 DIAGNOSIS — I69320 Aphasia following cerebral infarction: Secondary | ICD-10-CM

## 2016-12-19 DIAGNOSIS — R1310 Dysphagia, unspecified: Secondary | ICD-10-CM | POA: Diagnosis not present

## 2016-12-19 DIAGNOSIS — G92 Toxic encephalopathy: Secondary | ICD-10-CM | POA: Diagnosis present

## 2016-12-19 DIAGNOSIS — R2689 Other abnormalities of gait and mobility: Secondary | ICD-10-CM | POA: Diagnosis not present

## 2016-12-19 DIAGNOSIS — G8191 Hemiplegia, unspecified affecting right dominant side: Secondary | ICD-10-CM | POA: Diagnosis not present

## 2016-12-19 DIAGNOSIS — J9600 Acute respiratory failure, unspecified whether with hypoxia or hypercapnia: Secondary | ICD-10-CM | POA: Diagnosis not present

## 2016-12-19 DIAGNOSIS — I4430 Unspecified atrioventricular block: Secondary | ICD-10-CM | POA: Diagnosis present

## 2016-12-19 DIAGNOSIS — N390 Urinary tract infection, site not specified: Secondary | ICD-10-CM | POA: Diagnosis not present

## 2016-12-19 DIAGNOSIS — E876 Hypokalemia: Secondary | ICD-10-CM | POA: Diagnosis not present

## 2016-12-19 DIAGNOSIS — E890 Postprocedural hypothyroidism: Secondary | ICD-10-CM | POA: Diagnosis not present

## 2016-12-19 DIAGNOSIS — I10 Essential (primary) hypertension: Secondary | ICD-10-CM | POA: Diagnosis present

## 2016-12-19 DIAGNOSIS — I5033 Acute on chronic diastolic (congestive) heart failure: Secondary | ICD-10-CM | POA: Diagnosis not present

## 2016-12-19 DIAGNOSIS — T40601S Poisoning by unspecified narcotics, accidental (unintentional), sequela: Secondary | ICD-10-CM | POA: Diagnosis not present

## 2016-12-19 DIAGNOSIS — I610 Nontraumatic intracerebral hemorrhage in hemisphere, subcortical: Secondary | ICD-10-CM | POA: Diagnosis not present

## 2016-12-19 DIAGNOSIS — G929 Unspecified toxic encephalopathy: Secondary | ICD-10-CM | POA: Diagnosis present

## 2016-12-19 DIAGNOSIS — R0682 Tachypnea, not elsewhere classified: Secondary | ICD-10-CM | POA: Diagnosis not present

## 2016-12-19 DIAGNOSIS — I69351 Hemiplegia and hemiparesis following cerebral infarction affecting right dominant side: Secondary | ICD-10-CM | POA: Diagnosis not present

## 2016-12-19 DIAGNOSIS — I5032 Chronic diastolic (congestive) heart failure: Secondary | ICD-10-CM | POA: Diagnosis not present

## 2016-12-19 DIAGNOSIS — Z6831 Body mass index (BMI) 31.0-31.9, adult: Secondary | ICD-10-CM

## 2016-12-19 DIAGNOSIS — E46 Unspecified protein-calorie malnutrition: Secondary | ICD-10-CM | POA: Diagnosis not present

## 2016-12-19 DIAGNOSIS — D62 Acute posthemorrhagic anemia: Secondary | ICD-10-CM | POA: Diagnosis not present

## 2016-12-19 DIAGNOSIS — Z888 Allergy status to other drugs, medicaments and biological substances status: Secondary | ICD-10-CM | POA: Diagnosis not present

## 2016-12-19 DIAGNOSIS — R7303 Prediabetes: Secondary | ICD-10-CM | POA: Diagnosis not present

## 2016-12-19 DIAGNOSIS — S060X0A Concussion without loss of consciousness, initial encounter: Secondary | ICD-10-CM | POA: Diagnosis not present

## 2016-12-19 DIAGNOSIS — I129 Hypertensive chronic kidney disease with stage 1 through stage 4 chronic kidney disease, or unspecified chronic kidney disease: Secondary | ICD-10-CM | POA: Diagnosis not present

## 2016-12-19 DIAGNOSIS — B9620 Unspecified Escherichia coli [E. coli] as the cause of diseases classified elsewhere: Secondary | ICD-10-CM | POA: Diagnosis not present

## 2016-12-19 DIAGNOSIS — E441 Mild protein-calorie malnutrition: Secondary | ICD-10-CM | POA: Diagnosis not present

## 2016-12-19 DIAGNOSIS — Z880 Allergy status to penicillin: Secondary | ICD-10-CM | POA: Diagnosis not present

## 2016-12-19 DIAGNOSIS — I63512 Cerebral infarction due to unspecified occlusion or stenosis of left middle cerebral artery: Secondary | ICD-10-CM | POA: Diagnosis not present

## 2016-12-19 DIAGNOSIS — Z91013 Allergy to seafood: Secondary | ICD-10-CM

## 2016-12-19 DIAGNOSIS — R0603 Acute respiratory distress: Secondary | ICD-10-CM | POA: Diagnosis not present

## 2016-12-19 DIAGNOSIS — Z7901 Long term (current) use of anticoagulants: Secondary | ICD-10-CM

## 2016-12-19 DIAGNOSIS — M8580 Other specified disorders of bone density and structure, unspecified site: Secondary | ICD-10-CM | POA: Diagnosis not present

## 2016-12-19 DIAGNOSIS — I6389 Other cerebral infarction: Secondary | ICD-10-CM

## 2016-12-19 DIAGNOSIS — E7800 Pure hypercholesterolemia, unspecified: Secondary | ICD-10-CM | POA: Diagnosis not present

## 2016-12-19 DIAGNOSIS — T40601A Poisoning by unspecified narcotics, accidental (unintentional), initial encounter: Principal | ICD-10-CM | POA: Diagnosis present

## 2016-12-19 DIAGNOSIS — E039 Hypothyroidism, unspecified: Secondary | ICD-10-CM | POA: Diagnosis present

## 2016-12-19 DIAGNOSIS — Z91041 Radiographic dye allergy status: Secondary | ICD-10-CM | POA: Diagnosis not present

## 2016-12-19 DIAGNOSIS — R0681 Apnea, not elsewhere classified: Secondary | ICD-10-CM | POA: Diagnosis present

## 2016-12-19 DIAGNOSIS — I69392 Facial weakness following cerebral infarction: Secondary | ICD-10-CM | POA: Diagnosis not present

## 2016-12-19 DIAGNOSIS — F29 Unspecified psychosis not due to a substance or known physiological condition: Secondary | ICD-10-CM

## 2016-12-19 DIAGNOSIS — I69390 Apraxia following cerebral infarction: Secondary | ICD-10-CM | POA: Diagnosis not present

## 2016-12-19 DIAGNOSIS — N183 Chronic kidney disease, stage 3 (moderate): Secondary | ICD-10-CM | POA: Diagnosis not present

## 2016-12-19 DIAGNOSIS — R4702 Dysphasia: Secondary | ICD-10-CM | POA: Diagnosis not present

## 2016-12-19 DIAGNOSIS — D638 Anemia in other chronic diseases classified elsewhere: Secondary | ICD-10-CM | POA: Diagnosis not present

## 2016-12-19 DIAGNOSIS — I69322 Dysarthria following cerebral infarction: Secondary | ICD-10-CM | POA: Diagnosis not present

## 2016-12-19 LAB — BLOOD GAS, ARTERIAL
ACID-BASE EXCESS: 4 mmol/L — AB (ref 0.0–2.0)
BICARBONATE: 27.9 mmol/L (ref 20.0–28.0)
Drawn by: 27022
O2 Saturation: 98 %
PATIENT TEMPERATURE: 98.6
PO2 ART: 97 mmHg (ref 83.0–108.0)
pCO2 arterial: 37.5 mmHg (ref 32.0–48.0)
pH, Arterial: 7.479 — ABNORMAL HIGH (ref 7.350–7.450)

## 2016-12-19 LAB — URINALYSIS, ROUTINE W REFLEX MICROSCOPIC
BILIRUBIN URINE: NEGATIVE
GLUCOSE, UA: NEGATIVE mg/dL
Hgb urine dipstick: NEGATIVE
KETONES UR: NEGATIVE mg/dL
Leukocytes, UA: NEGATIVE
Nitrite: NEGATIVE
PH: 6 (ref 5.0–8.0)
Protein, ur: NEGATIVE mg/dL
SPECIFIC GRAVITY, URINE: 1.015 (ref 1.005–1.030)

## 2016-12-19 LAB — CK TOTAL AND CKMB (NOT AT ARMC)
CK TOTAL: 21 U/L — AB (ref 38–234)
CK, MB: 1.8 ng/mL (ref 0.5–5.0)
Relative Index: INVALID (ref 0.0–2.5)

## 2016-12-19 LAB — CBC
HCT: 37.2 % (ref 36.0–46.0)
HEMOGLOBIN: 11.9 g/dL — AB (ref 12.0–15.0)
MCH: 29.8 pg (ref 26.0–34.0)
MCHC: 32 g/dL (ref 30.0–36.0)
MCV: 93.2 fL (ref 78.0–100.0)
Platelets: 321 10*3/uL (ref 150–400)
RBC: 3.99 MIL/uL (ref 3.87–5.11)
RDW: 14.2 % (ref 11.5–15.5)
WBC: 9.9 10*3/uL (ref 4.0–10.5)

## 2016-12-19 LAB — LACTIC ACID, PLASMA: Lactic Acid, Venous: 2.3 mmol/L (ref 0.5–1.9)

## 2016-12-19 LAB — BASIC METABOLIC PANEL
Anion gap: 10 (ref 5–15)
BUN: 31 mg/dL — AB (ref 6–20)
CHLORIDE: 102 mmol/L (ref 101–111)
CO2: 25 mmol/L (ref 22–32)
CREATININE: 1.14 mg/dL — AB (ref 0.44–1.00)
Calcium: 9.2 mg/dL (ref 8.9–10.3)
GFR calc Af Amer: 52 mL/min — ABNORMAL LOW (ref >60)
GFR calc non Af Amer: 45 mL/min — ABNORMAL LOW (ref >60)
Glucose, Bld: 115 mg/dL — ABNORMAL HIGH (ref 65–99)
Potassium: 4.5 mmol/L (ref 3.5–5.1)
SODIUM: 137 mmol/L (ref 135–145)

## 2016-12-19 LAB — MAGNESIUM: Magnesium: 2 mg/dL (ref 1.7–2.4)

## 2016-12-19 LAB — TROPONIN I: Troponin I: <0.03 ng/mL (ref <0.03)

## 2016-12-19 LAB — PHOSPHORUS: Phosphorus: 4.7 mg/dL — ABNORMAL HIGH (ref 2.5–4.6)

## 2016-12-19 LAB — PROCALCITONIN

## 2016-12-19 MED ORDER — IRBESARTAN 150 MG PO TABS
150.0000 mg | ORAL_TABLET | Freq: Every day | ORAL | Status: DC
  Administered 2016-12-20 – 2016-12-23 (×4): 150 mg via ORAL
  Filled 2016-12-19 (×4): qty 1

## 2016-12-19 MED ORDER — NALOXONE HCL 0.4 MG/ML IJ SOLN
INTRAMUSCULAR | Status: AC
  Filled 2016-12-19: qty 1

## 2016-12-19 MED ORDER — BACLOFEN 5 MG HALF TABLET: 5.0000 mg | ORAL_TABLET | Freq: Three times a day (TID) | ORAL | Status: DC

## 2016-12-19 MED ORDER — NALOXONE HCL 0.4 MG/ML IJ SOLN
INTRAMUSCULAR | Status: AC
  Administered 2016-12-19: 0.4 mg via INTRAVENOUS
  Filled 2016-12-19: qty 1

## 2016-12-19 MED ORDER — SODIUM CHLORIDE 0.45 % IV SOLN: INTRAVENOUS | Status: DC

## 2016-12-19 MED ORDER — MORPHINE SULFATE ER 15 MG PO TBCR: 15.0000 mg | EXTENDED_RELEASE_TABLET | Freq: Two times a day (BID) | ORAL | Status: DC

## 2016-12-19 MED ORDER — FLUOXETINE HCL 20 MG PO CAPS
20.0000 mg | ORAL_CAPSULE | Freq: Every day | ORAL | Status: DC
  Administered 2016-12-20 – 2016-12-23 (×4): 20 mg via ORAL
  Filled 2016-12-19 (×4): qty 1

## 2016-12-19 MED ORDER — LEVOTHYROXINE SODIUM 88 MCG PO TABS
88.0000 ug | ORAL_TABLET | Freq: Every day | ORAL | Status: DC
  Administered 2016-12-21 – 2016-12-23 (×3): 88 ug via ORAL
  Filled 2016-12-19 (×2): qty 1

## 2016-12-19 MED ORDER — NORTRIPTYLINE HCL 10 MG PO CAPS: 20.0000 mg | ORAL_CAPSULE | Freq: Every day | ORAL | Status: DC

## 2016-12-19 MED ORDER — SIMVASTATIN 20 MG PO TABS
10.0000 mg | ORAL_TABLET | Freq: Every day | ORAL | Status: DC
  Administered 2016-12-20 – 2016-12-22 (×3): 10 mg via ORAL
  Filled 2016-12-19 (×3): qty 1

## 2016-12-19 MED ORDER — NALOXONE HCL 0.4 MG/ML IJ SOLN
0.2000 mg | INTRAMUSCULAR | Status: DC | PRN
  Administered 2016-12-19: 0.2 mg via INTRAVENOUS

## 2016-12-19 MED ORDER — METOPROLOL TARTRATE 25 MG PO TABS: 25.0000 mg | ORAL_TABLET | Freq: Two times a day (BID) | ORAL | Status: DC

## 2016-12-19 MED ORDER — NALOXONE HCL 0.4 MG/ML IJ SOLN
0.4000 mg | INTRAMUSCULAR | Status: DC | PRN
  Administered 2016-12-19 (×3): 0.4 mg via INTRAVENOUS
  Filled 2016-12-19: qty 1

## 2016-12-19 MED ORDER — SODIUM CHLORIDE 0.9 % IV SOLN
250.0000 mL | INTRAVENOUS | Status: DC | PRN
  Administered 2016-12-20: 150 mL/h via INTRAVENOUS

## 2016-12-19 MED ORDER — NALOXONE HCL 0.4 MG/ML IJ SOLN
INTRAMUSCULAR | Status: AC
  Administered 2016-12-19: 0.4 mg
  Filled 2016-12-19: qty 1

## 2016-12-19 MED ORDER — NORTRIPTYLINE HCL 10 MG PO CAPS
20.0000 mg | ORAL_CAPSULE | Freq: Every day | ORAL | Status: DC
  Administered 2016-12-20 – 2016-12-22 (×3): 20 mg via ORAL
  Filled 2016-12-19 (×6): qty 2

## 2016-12-19 MED ORDER — NALOXONE HCL 0.4 MG/ML IJ SOLN: 0.4000 mg | INTRAMUSCULAR | Status: DC | PRN

## 2016-12-19 MED ORDER — NALOXONE HCL 2 MG/2ML IJ SOSY
0.5000 mg/h | PREFILLED_SYRINGE | INTRAMUSCULAR | Status: DC
  Administered 2016-12-20: 0.5 mg/h via INTRAVENOUS
  Filled 2016-12-19 (×2): qty 4

## 2016-12-19 MED ORDER — NIFEDIPINE ER OSMOTIC RELEASE 30 MG PO TB24
30.0000 mg | ORAL_TABLET | Freq: Every day | ORAL | Status: DC
  Administered 2016-12-20 – 2016-12-23 (×4): 30 mg via ORAL
  Filled 2016-12-19 (×4): qty 1

## 2016-12-19 MED ORDER — FUROSEMIDE 20 MG PO TABS
20.0000 mg | ORAL_TABLET | Freq: Every day | ORAL | Status: DC
  Administered 2016-12-20 – 2016-12-23 (×4): 20 mg via ORAL
  Filled 2016-12-19 (×4): qty 1

## 2016-12-19 NOTE — Progress Notes (Signed)
Telephone call to Dr Tessa Lerner HR 40, resp 8 and B/P 83/47. Rapid Response notified. Stat EKG and one amp Narcan.

## 2016-12-19 NOTE — Progress Notes (Signed)
[facility]IC[facility]ILITATION     PROGRESS NOTE  Subjective/Complaints:  No problems. Daughter at bedside. Denies pain  ROS: Limited due to language   Objective: Vital Signs: Blood pressure (!) 98/58, pulse (!) 53, temperature 97.5 F (36.4 C), temperature source Oral, resp. rate 18, height 5\' 3"  (1.6 m), weight 72.6 kg (160 lb), SpO2 96 %. No results found.  Recent Labs  12/18/16 1207  WBC 8.1  HGB 11.1*  HCT 35.2*  PLT 316    Recent Labs  12/18/16 1207  NA 137  K 4.9  CL 101  GLUCOSE 89  BUN 30*  CREATININE 1.18*  CALCIUM 8.9   CBG (last 3)  No results for input(s): GLUCAP in the last 72 hours.  Wt Readings from Last 3 Encounters:  12/16/16 72.6 kg (160 lb)  11/29/16 79.5 kg (175 lb 4.3 oz)  07/31/16 74.8 kg (165 lb)    Physical Exam:  BP (!) 98/58 (BP Location: Left Arm)   Pulse (!) 53   Temp 97.5 F (36.4 C) (Oral)   Resp 18   Ht 5\' 3"  (1.6 m)   Wt 72.6 kg (160 lb)   SpO2 96%   BMI 28.34 kg/m  Constitutional: She appears well-developedand well-nourished.  HENT: Normocephalicand atraumatic.  Eyes: EOMI. No discharge.  Cardiovascular: RRR. Respiratory: CTA B.  GI: Soft. Bowel sounds are normal.  Musculoskeletal: She no edema and no tenderness.  Neurological: She is alertand oriented x1.  Right facial weakness with dysarthria.  Expressive >>receptive aphasia, slowly improving. Can communicate with extra time Motor RUE: 2/5 shoulder abduction, 2/5 elbow flexion/extension, 1+/5 HI  RLE: 4-/5 HF, KE, 3+/5 ADF/PF   Extensor tone RLE LUE/LLE: 4+/5 throughout Skin: Skin is warmand dry.  Psychiatric: She has a normal mood and affect. Her behavior is normal.    Assessment/Plan: 1. Functional deficits secondary to left MCA distribution infarct which require 3+ hours per day of interdisciplinary therapy in a comprehensive inpatient rehab setting. Physiatrist is providing close team supervision and 24 hour management of active  medical problems listed below. Physiatrist and rehab team continue to assess barriers to discharge/monitor patient progress toward functional and medical goals.  Function:  Bathing Bathing position   Position: Shower  Bathing parts Body parts bathed by patient: Chest, Abdomen, Front perineal area, Right upper leg, Left upper leg, Right arm Body parts bathed by helper: Left arm, Buttocks, Right lower leg, Left lower leg  Bathing assist Assist Level:  (Mod assist)      Upper Body Dressing/Undressing Upper body dressing   What is the patient wearing?: Pull over shirt/dress     Pull over shirt/dress - Perfomed by patient: Thread/unthread left sleeve, Put head through opening, Pull shirt over trunk Pull over shirt/dress - Perfomed by helper: Thread/unthread right sleeve        Upper body assist Assist Level: Touching or steadying assistance(Pt > 75%)      Lower Body Dressing/Undressing Lower body dressing   What is the patient wearing?: Pants, Non-skid slipper socks     Pants- Performed by patient: Pull pants up/down Pants- Performed by helper: Thread/unthread right pants leg, Thread/unthread left pants leg, Pull pants up/down   Non-skid slipper socks- Performed by helper: Don/doff right sock, Don/doff left sock                  Lower body assist Assist for lower body dressing:  (Max Assist )      Toileting Toileting Toileting activity did  not occur: No continent bowel/bladder event Toileting steps completed by patient: Performs perineal hygiene Toileting steps completed by helper: Adjust clothing prior to toileting, Adjust clothing after toileting Toileting Assistive Devices: Grab bar or rail  Toileting assist Assist level: Touching or steadying assistance (Pt.75%)   Transfers Chair/bed transfer Chair/bed transfer activity did not occur: N/A Chair/bed transfer method: Stand pivot (chair/mat table transfer ) Chair/bed transfer assist level: Moderate assist (Pt 50 -  74%/lift or lower) Chair/bed transfer assistive device: Armrests     Locomotion Ambulation     Max distance: 40 ft Assist level: Moderate assist (Pt 50 - 74%)   Wheelchair   Type: Manual Max wheelchair distance: 40 Assist Level: Moderate assistance (Pt 50 - 74%)  Cognition Comprehension Comprehension assist level: Understands basic 75 - 89% of the time/ requires cueing 10 - 24% of the time  Expression Expression assist level: Expresses basic 25 - 49% of the time/requires cueing 50 - 75% of the time. Uses single words/gestures.  Social Interaction Social Interaction assist level: Interacts appropriately 75 - 89% of the time - Needs redirection for appropriate language or to initiate interaction.  Problem Solving Problem solving assist level: Solves basic 25 - 49% of the time - needs direction more than half the time to initiate, plan or complete simple activities  Memory Memory assist level: Recognizes or recalls 25 - 49% of the time/requires cueing 50 - 75% of the time    Medical Problem List and Plan: 1. Right hemiparesis and aphasia  secondary to left MCA distribution infarct  Cont CIR  Bracing ordered  Baclofen stopped due to sedation  Plan for botulinum toxin as outpt 2. DVT Prophylaxis/Anticoagulation: Mechanical: Sequential compression devices, below knee Bilateral lower extremities 3. Chronic back pain/Pain Management: On pamelor for neuropathy and MS contin bid.  4. Mood: LCSW to follow for evaluation and support.  5. Neuropsych: This patient is not capable of making decisions on her own behalf. 6. Skin/Wound Care: routine pressure relief measures.  7. Fluids/Electrolytes/Nutrition: May need IVF at nights if unable to maintain hydration.  8.HTN: Monitor BP bid.   Avapro increased to 112.5 on 5/22  Monitor with increased mobility  Improved control 9. PAC's: Monitor heart rate bid. Currently in NSR.  10. Diastolic Heart failure: Asymptomatic.  Filed Weights    12/03/16 1630 12/16/16 0430  Weight: 79.8 kg (175 lb 14.4 oz) 72.6 kg (160 lb)  11.H/o anemia with multiple transfusion with ABLA:  Hb up to 11.1 6/1 12. Depression: has been managed on Prozac.  13. CKD: Baseline SCr- 1.4 range. Continue to monitor. Check lytes serially to monitor stability.   Cr 1.18 on 6/1  Cont to monitor  D/ced IVF --continue to push fluids 14. Prediabetes: Hgb A1C- 5.8. Educate on appropriate diet as cognition improves.   Monitor with increased mobility  Controlled   15. Dysphagia: Advanced to D2 nectar.  16. Hypoalbuminemia  Supplement initiated 5/18 17. E. Coli UTI   Bactrim completed 5/23  LOS (Days) 16 A FACE TO FACE EVALUATION WAS PERFORMED  Valerie Mcclure T 12/19/2016 8:46 AM

## 2016-12-19 NOTE — Progress Notes (Signed)
Occupational Therapy Session Note  Patient Details  Name: Valerie Mcclure MRN: [id number] Date of Birth: [birth year]  Today's Date: 12/19/2016 OT Individual Time: 1303-1402 OT Individual Time Calculation (min): 59 min    Short Term Goals: Week 3:  OT Short Term Goal 1 (Week 3): Pt will locate items on Rt to complete self-care tasks with min cues OT Short Term Goal 2 (Week 3): STG = LTGs due to remaining LOS  Skilled Therapeutic Interventions/Progress Updates:    Tx focus on standing endurance, midline orientation, and R UE NMR.   Pt greeted in w/c with daughter present, agreeable to tx. Pt was escorted to dayroom. Had her engage in standing activity involving copying simple patterns with geometric shapes. Pt using L UE for task and weightbearing with R UE. Pt able to retrieve all pertinent shapes/pieces but required Max A to meet perceptual/problem solving demands of task. Pt with strong right lean and required manual/verbal/visual cues to improve. Longest standing time without rest 4 minutes, 3 trials. Pt then engaged in towel slides while seated, using lateral visual markers to assist her for R>L transitioning/visual scanning. Pt requiring mod cues for attention throughout. Listened to [place] for attention, but pt still intermittently falling asleep during tx. Cues provided for maintaining alertness. At end of tx pt was escorted back to room and left with all needs within reach and daughter present.   Therapy Documentation Precautions:  Precautions Precautions: Fall Restrictions Weight Bearing Restrictions: No  Pain: No c/o or expressions of pain during tx    ADL:      See Function Navigator for Current Functional Status.   Therapy/Group: Individual Therapy  Cheney Ewart A Edelyn Heidel 12/19/2016, 3:54 PM

## 2016-12-19 NOTE — Significant Event (Signed)
Rapid Response Event Note  Overview: Time Called: [place] Time: 1505    Initial Focused Assessment: Patient with decreased LOC.and decreased RR 97/64  HR 42  RR 12  O2 sat 100% on RA Lung sounds decreased bases, regular heart tones Pale skin, dusky hands Upon my arrival able to arouse patient, She is aphasic but able to speak some words. Right arm flaccid, unable to lift leg, some wiggling of toes.  Family at bedside.  Interventions: IV started by IV team, left wrist. Labs drawn Head CT done 1/2 ns started at 75cc/hr  Since patient easily arousable and VVS no narcan given  1700  Increased somnolence and RR 6-8. Narcan given at 1705. 12 lead EKG, A Flutter IVF increased to 150cc/hr Patient moving left side and increased RR, sneezing frequently After about 45 min patient back at her baseline for about 10 min  Then she became somnolent again. 2nd dose narcan given, patient starting to awake but not at baseline CCM doctor at bedside  3rd dose of narcan given, patient awoke irritated and angry. CCM fellow at bedside, spoke with family and Dr Naaman Plummer  Plan readmit patient to ICU 1945 Patient transported to 2h Via bed with O2. Patient arousable, mildly belligerent. But more calm than the past 108min. Staff at bedside received patient   Plan of Care (if not transferred):  Event Summary: Name of Physician Notified: Dr Naaman Plummer at 1515    at          [place]

## 2016-12-19 NOTE — Progress Notes (Signed)
Report given to [facility] for Room [phone]. Family at bedside. Now argumentative with husband , agitated.

## 2016-12-19 NOTE — Progress Notes (Signed)
[facility] Progress Note Patient Name: Valerie Mcclure DOB: Sep 29, 1940 MRN: [id number]   Date of Service  12/19/2016  HPI/Events of Note  Multiple: 1. Bradycardia - HR in 50's and 2. Decreased LOC.  eICU Interventions  Will order: 1. Narcan IV and IV infusion. 2. D/C Metoprolol. 3. Ground team to reassess at bedside.      Intervention Category Major Interventions: Arrhythmia - evaluation and management  Sommer,Steven Eugene 12/19/2016, 11:02 PM

## 2016-12-19 NOTE — Progress Notes (Signed)
Dr. Naaman Plummer notified of changes in patient's condition: 10 second periods of apnea, pallor, HR 40-50s, blood pressure 97/50-107/57, decreased LOC.  Orders given for CBC, BMET stat, 0.45NS at 75cc/hr IV, stat CT of head w/o contrast, hold Baclofen, metoprolol, nortryptiline, and morphine until further orders.

## 2016-12-19 NOTE — H&P (Signed)
PULMONARY / CRITICAL CARE MEDICINE   Name: Valerie Mcclure MRN: [id number] DOB: 15-Mar-1941    ADMISSION DATE:  12/19/2016 CHIEF COMPLAINT:  Somnolence/apnea  INITIAL PRESENTATION:   Patient is not cooperative and unable to provide HPI, the following is taken from chart review and discussion with family and nursing: 76 yo CF with h/o CKD, hyperlipidemia admitted with L-MCA CVA with secondary hemorrhagic conversion L temporal area. CTA showed left M1 occlusion due to acute thrombus or embolus with significant penumbra left temporoparietal lobe and irregular non-calcified plaque L-ICA. She underwent cerebral angio with complete revascularization of L-MCA with reperfusion. Follow up CCT showed large Left temporal hemorrhagic transformation and extravasated contrast in left basal ganglia and midline shift.  MRI brain L-MCA infarct with diffusion restriction left temporal lobe, superimposed 5.5 cm intra-axial hemorrhage centered at left basal ganglia with associated left hemisphere edema, effaced left lateral ventricle with trace IVH and occasional small foci of restriction in left thalamus and anterior left occipital lobe.   SIGNIFICANT EVENTS: She was transferred to the rehab facility and was doing well as of this morning.  However, this afternoon she became more somnolent and had periods of apnea.  These events responded to 0.4mg  IV narcan administration but re-administration (x3) was needed.  She had a STAT CT head which showed no new acute activity and improved midline shift.  At [place] she was found to have a new personality change after narcan in that she was angry and agitated.  Per nursing the rest of her neuro exam is unchanged.  Her primary service requested transfer to the ICU for possible narcan gtt.    PAST MEDICAL HISTORY :   has a past medical history of Blood transfusion without reported diagnosis; Chronic low back pain; Hyperlipidemia; Osteopenia; Unspecified essential hypertension; and  Unspecified hypothyroidism.  has a past surgical history that includes Abdominal hysterectomy (1970); Thyroidectomy; Lumbar laminectomy (11/2008); Cholecystectomy (07/2009); Colonoscopy (2003); Flexible sigmoidoscopy (2010); Hand surgery; IR PERCUTANEOUS ART THROMBECTOMY/INFUSION INTRACRANIAL INC DIAG ANGIO (11/29/2016); IR ANGIO VERTEBRAL SEL SUBCLAVIAN INNOMINATE UNI R MOD SED (11/29/2016); IR ANGIO INTRA EXTRACRAN SEL COM CAROTID INNOMINATE UNI L MOD SED (11/29/2016); and Radiology with anesthesia (N/A, 11/29/2016). Prior to Admission medications   Medication Sig Start Date End Date Taking? Authorizing Provider  Calcium Carbonate-Vit D-Min (CALCIUM 1200 PO) Take by mouth daily.      [provider]  Cholecalciferol (VITAMIN D3) 1000 UNITS CAPS Take by mouth daily.      [provider]  FLUoxetine (PROZAC) 20 MG capsule Take 1 capsule (20 mg total) by mouth daily. Patient taking differently: Take 40 mg by mouth daily.  04/27/14   Rowe Clack, MD  furosemide (LASIX) 20 MG tablet TAKE 1 TABLET BY MOUTH DAILY AS NEEDED 07/29/16   Burns, Claudina Lick, MD  levothyroxine (SYNTHROID, LEVOTHROID) 88 MCG tablet Take 1 tablet (88 mcg total) by mouth daily. 12/30/15   [name], MD  metoprolol tartrate (LOPRESSOR) 25 MG tablet TAKE 1 TABLET(25 MG) BY MOUTH TWICE DAILY 05/20/16   Burns, Claudina Lick, MD  morphine (MS CONTIN) 15 MG 12 hr tablet Take 15 mg by mouth every 12 (twelve) hours.  04/01/16   [provider]  NIFEdipine (PROCARDIA-XL/ADALAT-CC/NIFEDICAL-XL) 30 MG 24 hr tablet Take 30 mg by mouth daily.    [provider]  nortriptyline (PAMELOR) 10 MG capsule TAKE 2 CAPSULES BY MOUTH AT BEDTIME 06/08/16   Burns, Claudina Lick, MD  simvastatin (ZOCOR) 10 MG tablet TAKE 1 TABLET(10 MG)  BY MOUTH DAILY 10/14/16   [name], MD  valsartan (DIOVAN) 160 MG tablet Take 1 tablet (160 mg total) by mouth daily. 12/25/15   [name], MD  vitamin B-12 (CYANOCOBALAMIN) 100 MCG tablet Take  100 mcg by mouth daily.    [provider]   Allergies  Allergen Reactions  . Shrimp [Shellfish Allergy]     Break outs and swelling   . Tandem Plus [Fefum-Fepo-Fa-B Cmp-C-Zn-Mn-Cu] Nausea And Vomiting  . Ivp Dye [Iodinated Diagnostic Agents] Rash    itching  . Penicillins Rash    itching    FAMILY HISTORY:  indicated that the status of her father is unknown. She indicated that the status of her brother is unknown. She indicated that the status of her neg hx is unknown.   SOCIAL HISTORY:  reports that she has never smoked. She has never used smokeless tobacco. She reports that she does not drink alcohol or use drugs.  REVIEW OF SYSTEMS:  Unable to obtain  SUBJECTIVE:   VITAL SIGNS: Temp:  [97.5 F (36.4 C)-97.6 F (36.4 C)] 97.6 F (36.4 C) (06/02 2015) Pulse Rate:  [42-65] 65 (06/02 2015) Resp:  [16-19] 19 (06/02 2015) BP: (87-164)/(41-83) 164/65 (06/02 2015) SpO2:  [96 %-100 %] 100 % (06/02 2015) HEMODYNAMICS:   VENTILATOR SETTINGS:   INTAKE / OUTPUT: No intake or output data in the 24 hours ending 12/19/16 2056  PHYSICAL EXAMINATION: General:  Somnolent Neuro: (after 1mg  Narcan) Awakes to voice.  Strength 5/5 LUE and LLE.  Antigravity in RUE and RLE.  + dysarthria. HEENT:  NCAT Cardiovascular:  Bradycardic, irregular, no m/r/g Lungs:  CTA b/l no w/r/r Abdomen:  Soft, NT normal bowel sounds Skin:  No c/c/e  LABS:  CBC  Recent Labs Lab 12/14/16 0728 12/18/16 1207 12/19/16 1534  WBC 8.2 8.1 9.9  HGB 9.3* 11.1* 11.9*  HCT 29.9* 35.2* 37.2  PLT 220 316 321   Coag's No results for input(s): APTT, INR in the last 168 hours. BMET  Recent Labs Lab 12/14/16 0607 12/18/16 1207 12/19/16 1534  NA 141 137 137  K 4.6 4.9 4.5  CL 108 101 102  CO2 26 26 25   BUN 27* 30* 31*  CREATININE 1.14* 1.18* 1.14*  GLUCOSE 99 89 115*   Electrolytes  Recent Labs Lab 12/14/16 0607 12/18/16 1207 12/19/16 1534  CALCIUM 8.3* 8.9 9.2   Sepsis  Markers No results for input(s): LATICACIDVEN, PROCALCITON, O2SATVEN in the last 168 hours. ABG  Recent Labs Lab 12/19/16 1517  PHART 7.479*  PCO2ART 37.5  PO2ART 97   Liver Enzymes No results for input(s): AST, ALT, ALKPHOS, BILITOT, ALBUMIN in the last 168 hours. Cardiac Enzymes  Recent Labs Lab 12/19/16 1534  TROPONINI <0.03   Glucose No results for input(s): GLUCAP in the last 168 hours.  Imaging Ct Head Wo Contrast  Result Date: 12/19/2016 CLINICAL DATA:  Loss consciousness for 10 seconds. EXAM: CT HEAD WITHOUT CONTRAST TECHNIQUE: Contiguous axial images were obtained from the base of the skull through the vertex without intravenous contrast. COMPARISON:  Brain MRI 11/30/2016 FINDINGS: Brain: Subacute left MCA territory infarct with cytotoxic edema involving the left basal ganglia, deep white matter tracts, and lateral temporal lobe primarily. There was hemorrhagic conversion at basal ganglia, with decreased clot mass effect and high-density since prior. High-density is now mainly seen at the level of the putamen. Midline shift now measures 1-2 mm, as compared to 7 mm previously. No acute hemorrhage. No evidence of progressive  infarct. Vascular: Atherosclerotic calcification. No hyperdense vessel noted. Skull: No acute or aggressive finding. Sinuses/Orbits: Negative IMPRESSION: 1. No acute finding. 2. Expected evolution of subacute left MCA territory infarct with hemorrhagic conversion. Midline shift is nearly resolved. Electronically Signed   By: Monte Fantasia M.D.   On: 12/19/2016 16:43   Dg Chest Port 1 View  Result Date: 12/19/2016 CLINICAL DATA:  Respiratory depression EXAM: PORTABLE CHEST 1 VIEW COMPARISON:  12/01/2016 FINDINGS: Cardiac shadow is mildly enlarged. The lungs are well aerated bilaterally. No focal infiltrate or sizable effusion is seen. No bony abnormality is noted. IMPRESSION: No acute abnormality seen. Electronically Signed   By: Inez Catalina M.D.   On:  12/19/2016 18:50     ASSESSMENT / PLAN: 76yo CF with acute L MCA CVA with secondary hemorrhage transferred from rehab for acute encephalopathy likely toxic 2/2 narcotic accumulation given her response to narcan.  CT head demonstrates stability - improvement and other metabolic indices/septic work up has been unrevealing.   PULMONARY A: Hypopnea - 2/2 narcotic OD  P:   - ABG with appropriate ventilation - supplemental O2 PRN for sats 88-94%  CARDIOVASCULAR A:  Aflutter/Afib with Slow ventricular response Bradycardia P:  - Holding BB for now given bradycardia - Troponin negative  RENAL A:   Hyperphos P:   - Trend phosphate  GASTROINTESTINAL A:   Not active P:   - NPO given encephalopathy  HEMATOLOGIC A:   Mild anemia - likely iatrogenic P:  - trend cbc  INFECTIOUS A:   Not active P:   BCx 12/19/2016 UA negative Sputum none Holding Antibiotics for now. Procal negative  ENDOCRINE A:     Mild hyperglycemia P:   - Monitor BG - Goal BG <180  NEUROLOGIC A:   Acute encephalopathy - likely 2/2 narcotic accumulation Left MCA CVA with hemorrhagic conversion   P:   - CT head without acute changes x2 - Narcan 1mg  with significant response - narcan gtt @ 0.5mg /hr - Holding anticoagulation - PT/OT - Holding ASA given hemorrhage  - BP control SBP goal <160 - continue statin - UA negative - Sepsis work up negative - hold MS Contin and further sedating medications  DVT PPx: SCD FEN: NPO for now Code status: Full  FAMILY  - Updates: discussed plan with daughter today at bedside  Total critical care time: 60 min  Critical care time was exclusive of separately billable procedures and treating other patients.  Critical care was necessary to treat or prevent imminent or life-threatening deterioration.  Critical care was time spent personally by me on the following activities: development of treatment plan with patient and/or surrogate as well as nursing,  discussions with consultants, evaluation of patient's response to treatment, examination of patient, obtaining history from patient or surrogate, ordering and performing treatments and interventions, ordering and review of laboratory studies, ordering and review of radiographic studies, pulse oximetry and re-evaluation of patient's condition.   Meribeth Mattes, DO., MS [facility]     Pulmonary and [place] Pager: [phone]  12/19/2016, 8:56 PM

## 2016-12-20 ENCOUNTER — Inpatient Hospital Stay (HOSPITAL_COMMUNITY): Payer: Medicare Other | Admitting: Occupational Therapy

## 2016-12-20 DIAGNOSIS — T40601A Poisoning by unspecified narcotics, accidental (unintentional), initial encounter: Principal | ICD-10-CM

## 2016-12-20 LAB — CBC
HCT: 33.8 % — ABNORMAL LOW (ref 36.0–46.0)
Hemoglobin: 10.7 g/dL — ABNORMAL LOW (ref 12.0–15.0)
MCH: 28.8 pg (ref 26.0–34.0)
MCHC: 31.7 g/dL (ref 30.0–36.0)
MCV: 91.1 fL (ref 78.0–100.0)
PLATELETS: 244 10*3/uL (ref 150–400)
RBC: 3.71 MIL/uL — AB (ref 3.87–5.11)
RDW: 13.9 % (ref 11.5–15.5)
WBC: 13.4 10*3/uL — ABNORMAL HIGH (ref 4.0–10.5)

## 2016-12-20 LAB — BASIC METABOLIC PANEL
Anion gap: 8 (ref 5–15)
BUN: 25 mg/dL — ABNORMAL HIGH (ref 6–20)
CO2: 27 mmol/L (ref 22–32)
CREATININE: 1.21 mg/dL — AB (ref 0.44–1.00)
Calcium: 8.7 mg/dL — ABNORMAL LOW (ref 8.9–10.3)
Chloride: 103 mmol/L (ref 101–111)
GFR calc Af Amer: 49 mL/min — ABNORMAL LOW (ref >60)
GFR, EST NON AFRICAN AMERICAN: 42 mL/min — AB (ref >60)
GLUCOSE: 128 mg/dL — AB (ref 65–99)
POTASSIUM: 4.4 mmol/L (ref 3.5–5.1)
SODIUM: 138 mmol/L (ref 135–145)

## 2016-12-20 LAB — LACTIC ACID, PLASMA: Lactic Acid, Venous: 1.5 mmol/L (ref 0.5–1.9)

## 2016-12-20 MED ORDER — NALOXONE HCL 0.4 MG/ML IJ SOLN
INTRAMUSCULAR | Status: AC
  Administered 2016-12-20: 1 mg
  Filled 2016-12-20: qty 1

## 2016-12-20 MED ORDER — ORAL CARE MOUTH RINSE
15.0000 mL | Freq: Two times a day (BID) | OROMUCOSAL | Status: DC
  Administered 2016-12-20: 15 mL via OROMUCOSAL

## 2016-12-20 MED ORDER — RESOURCE THICKENUP CLEAR PO POWD
ORAL | Status: DC | PRN
  Filled 2016-12-20: qty 125

## 2016-12-20 MED ORDER — CHLORHEXIDINE GLUCONATE 0.12 % MT SOLN
15.0000 mL | Freq: Two times a day (BID) | OROMUCOSAL | Status: DC
  Administered 2016-12-20 – 2016-12-22 (×7): 15 mL via OROMUCOSAL
  Filled 2016-12-20 (×5): qty 15

## 2016-12-20 MED ORDER — STARCH (THICKENING) PO POWD
ORAL | Status: DC | PRN
  Filled 2016-12-20 (×2): qty 227

## 2016-12-20 MED ORDER — SODIUM CHLORIDE 0.9 % IV SOLN
INTRAVENOUS | Status: DC
  Administered 2016-12-20 – 2016-12-22 (×6): via INTRAVENOUS
  Administered 2016-12-22: 150 mL/h via INTRAVENOUS
  Administered 2016-12-22: 1000 mL via INTRAVENOUS
  Administered 2016-12-22: 150 mL/h via INTRAVENOUS
  Administered 2016-12-23 (×3): via INTRAVENOUS

## 2016-12-20 NOTE — Progress Notes (Signed)
S:  RN reports concern for periods of AMS, ? Seizure activity   O: Blood pressure (!) 143/70, pulse 71, temperature 97.7 F (36.5 C), temperature source Oral, resp. rate 13, height 5\' 2"  (1.575 m), weight 165 lb 9.1 oz (75.1 kg), SpO2 100 %.  General:  Chronically ill appearing female in NAD HEENT: MM pink/moist, pupils =R Neuro: awake, speech inflection appropriate but stream of words not appropriate, alert, moves L side  CV: s1s2 rrr, no m/r/g PULM: even/non-labored, lungs bilaterally clear  MB:WGYK, non-tender, bsx4 active  Extremities: warm/dry, no edema  Skin: no rashes or lesions   Recent Labs Lab 12/18/16 1207 12/19/16 1534 12/20/16 0141  HGB 11.1* 11.9* 10.7*  HCT 35.2* 37.2 33.8*  WBC 8.1 9.9 13.4*  PLT 316 321 244    Recent Labs Lab 12/14/16 0607 12/18/16 1207 12/19/16 1534 12/19/16 2120 12/20/16 0141  NA 141 137 137  --  138  K 4.6 4.9 4.5  --  4.4  CL 108 101 102  --  103  CO2 26 26 25   --  27  GLUCOSE 99 89 115*  --  128*  BUN 27* 30* 31*  --  25*  CREATININE 1.14* 1.18* 1.14*  --  1.21*  CALCIUM 8.3* 8.9 9.2  --  8.7*  MG  --   --   --  2.0  --   PHOS  --   --   --  4.7*  --     A:  Acute Encephalopathy - in setting of narcotic accumulation  Hx Recent L MCA CVA with Hemorrhagic Conversion AF/AFib  Bradycardia  Hyperphosphatemia  Mild Hyperglycemia  Chronic Pain  P: Continue plan of care as outlined Monitor for any seizure activity  Minimize sedation  Follow up BMP, CBC, PCXR in am  Consider EEG if new evidence of seizure Discontinue narcan gtt, observe for sedation  PRN narcan Serial neuro exam Hold extended release morphine > may need lower dosing given she has had CVA   To SDU, TRH as of 6/4 am  Noe Gens, NP-C [facility] Pgr: [phone] or if no answer [phone] 12/20/2016, 1:17 PM

## 2016-12-20 NOTE — Evaluation (Signed)
Clinical/Bedside Swallow Evaluation Patient Details  Name: Valerie Mcclure MRN: [id number] Date of Birth: 23-Jul-1940  Today's Date: 12/20/2016 Time: SLP Start Time (ACUTE ONLY): 1138 SLP Stop Time (ACUTE ONLY): 1158 SLP Time Calculation (min) (ACUTE ONLY): 20 min  Past Medical History:  Past Medical History:  Diagnosis Date  . Blood transfusion without reported diagnosis   . Chronic low back pain   . Hyperlipidemia   . Osteopenia   . Unspecified essential hypertension   . Unspecified hypothyroidism    Past Surgical History:  Past Surgical History:  Procedure Laterality Date  . ABDOMINAL HYSTERECTOMY  1970  . CHOLECYSTECTOMY  07/2009   Dr. Rise Patience  . COLONOSCOPY  2003  . FLEXIBLE SIGMOIDOSCOPY  2010  . HAND SURGERY    . IR ANGIO INTRA EXTRACRAN SEL COM CAROTID INNOMINATE UNI L MOD SED  11/29/2016  . IR ANGIO VERTEBRAL SEL SUBCLAVIAN INNOMINATE UNI R MOD SED  11/29/2016  . IR PERCUTANEOUS ART THROMBECTOMY/INFUSION INTRACRANIAL INC DIAG ANGIO  11/29/2016  . LUMBAR LAMINECTOMY  11/2008   Done by Dr. Patrice Paradise  . RADIOLOGY WITH ANESTHESIA N/A 11/29/2016   Procedure: RADIOLOGY WITH ANESTHESIA;  Surgeon: Radiologist, Medication, MD;  Location: [facility];  Service: Radiology;  Laterality: N/A;  . THYROIDECTOMY     HPI:  76yo CF with acute L MCA CVA with secondary hemorrhage transferred from rehab for acute encephalopathy likely toxic 2/2 narcotic accumulation given her response to narcan. CT head demonstrates stability - improvement and other metabolic indices/septic work up has been unrevealing. Pt receiving skilled ST in CIR for dysphagia and aphasia, had advanced to D2/nectar thick liquids prior to this admission. Recent MBS 12/08/16 findings mild-moderate oropharyngeal dysphagia with motor planning and sensorimotor deficits, recommended Dys 2/nectar with full supervision, meds whole in puree.   Assessment / Plan / Recommendation Clinical Impression  Patient presents with oropharyngeal  swallowing function which appears consistent with documented function in CIR. Pt is alert, cooperative though remains aphasic, apraxic. Per SLP notes, pt has been consuming dys 2/nectar-thick liquids with minimal overt s/s of aspiration and supervision for use of compensatory strategies. Tolerates nectar thick liquids via cup and straw sip without overt signs of aspiration, even when challenged with multiple consecutive boluses. R anterior spillage with pureed and regular solids. No new neuro findings; recommend pt resume prior diet of dys 2/nectar thick liquids with full supervision, medications whole in puree. SLP will f/u for tolerance, cognitive linguistic evaluation. Given persisting deficits in swallowing and communication, recommend pt return to CIR for intensive ST when appropriate. SLP Visit Diagnosis: Dysphagia, oropharyngeal phase (R13.12)    Aspiration Risk  Mild aspiration risk    Diet Recommendation Nectar-thick liquid;Dysphagia 2 (Fine chop)   Liquid Administration via: Straw;Cup Medication Administration: Whole meds with puree Supervision: Staff to assist with self feeding;Full supervision/cueing for compensatory strategies Compensations: Slow rate;Small sips/bites;Monitor for anterior loss;Multiple dry swallows after each bite/sip;Follow solids with liquid Postural Changes: Seated upright at 90 degrees    Other  Recommendations Oral Care Recommendations: Oral care BID Other Recommendations: Order thickener from pharmacy;Prohibited food (jello, ice cream, thin soups);Remove water pitcher   Follow up Recommendations Inpatient Rehab      Frequency and Duration min 2x/week  2 weeks       Prognosis Prognosis for Safe Diet Advancement: Good Barriers to Reach Goals: Language deficits;Cognitive deficits      Swallow Study   General Date of Onset: 12/19/16 HPI: 76yo CF with acute L MCA CVA with secondary hemorrhage transferred  from rehab for acute encephalopathy likely toxic  2/2 narcotic accumulation given her response to narcan. CT head demonstrates stability - improvement and other metabolic indices/septic work up has been unrevealing. Pt receiving skilled ST in CIR for dysphagia and aphasia, had advanced to D2/nectar thick liquids prior to this admission. Recent MBS 12/08/16 findings mild-moderate oropharyngeal dysphagia with motor planning and sensorimotor deficits, recommended Dys 2/nectar with full supervision, meds whole in puree. Type of Study: Bedside Swallow Evaluation Previous Swallow Assessment: FEES 12/01/2016, MBS 12/08/16 Diet Prior to this Study: NPO Temperature Spikes Noted: No Respiratory Status: Room air History of Recent Intubation: No Behavior/Cognition: Alert;Cooperative;Requires cueing Oral Cavity Assessment: Within Functional Limits Oral Care Completed by SLP: Yes Oral Cavity - Dentition: Adequate natural dentition Vision: Functional for self-feeding Self-Feeding Abilities: Needs assist;Needs set up Patient Positioning: Upright in bed Baseline Vocal Quality: Normal Volitional Cough: Weak Volitional Swallow: Able to elicit    Oral/Motor/Sensory Function Overall Oral Motor/Sensory Function: Mild impairment Facial ROM: Reduced right;Suspected CN VII (facial) dysfunction Facial Symmetry: Abnormal symmetry right;Suspected CN VII (facial) dysfunction Facial Strength: Reduced right;Suspected CN VII (facial) dysfunction Facial Sensation: Within Functional Limits Lingual ROM: Reduced left;Suspected CN XII (hypoglossal) dysfunction Lingual Symmetry: Abnormal symmetry left;Suspected CN XII (hypoglossal) dysfunction Lingual Strength: Reduced;Suspected CN XII (hypoglossal) dysfunction Velum: Within Functional Limits Mandible: Within Functional Limits   Ice Chips Ice chips: Within functional limits Presentation: Spoon   Thin Liquid Thin Liquid: Impaired Presentation: Cup Oral Phase Impairments: Reduced labial seal Oral Phase Functional  Implications: Right anterior spillage    Nectar Thick Nectar Thick Liquid: Within functional limits Presentation: Cup;Self Fed;Straw   Honey Thick Honey Thick Liquid: Not tested   Puree Puree: Impaired Oral Phase Impairments: Reduced labial seal;Poor awareness of bolus Oral Phase Functional Implications: Right anterior spillage   Solid   GO   Solid: Impaired Presentation: Self Fed Oral Phase Impairments: Reduced labial seal;Poor awareness of bolus;Impaired mastication Oral Phase Functional Implications: Right anterior spillage        Aliene Altes 12/20/2016,2:54 PM  Deneise Lever, [place], [place] Speech-Language Pathologist [phone]

## 2016-12-20 NOTE — Progress Notes (Signed)
Occupational Therapy Session Note  Patient Details  Name: Valerie Mcclure MRN: [id number] Date of Birth: 1941/01/31  Today's Date: 12/20/2016 OT Missed Time: 68 Minutes Missed Time Reason: Other (comment) (d/c off CIR unit for medical reasons)   Skilled Therapeutic Interventions/Progress Updates:    Pt d/c from CIR unit due to medical reasons. 45 minutes of skilled OT missed.   Therapy Documentation   Vital Signs: Therapy Vitals Temp: 97.5 F (36.4 C) Temp Source: Oral Pulse Rate: 68 Resp: 15 BP: (!) 144/72 Patient Position (if appropriate): Lying Oxygen Therapy SpO2: 100 % O2 Device: Nasal Cannula O2 Flow Rate (L/min): 2 L/min   ADL:    See Function Navigator for Current Functional Status.   Therapy/Group: Individual Therapy  [name] A [name] 12/20/2016, 8:28 PM

## 2016-12-20 NOTE — Progress Notes (Signed)
   12/20/16 1800  Vitals  BP (!) 164/85  MAP (mmHg) 108  Pulse Rate 73  ECG Heart Rate 71  Resp 16  Oxygen Therapy  SpO2 100 %  MD/Black Box called for SBP,

## 2016-12-20 NOTE — Progress Notes (Signed)
Pt. transferred from CIR back to acute on 12/20/26 with acute encephalopathy in the setting of narcotic accumulation.  Admissions coordinator will follow up Monday.    [facility] Admissions Coordinator Cell [phone] Office [phone]

## 2016-12-20 NOTE — Progress Notes (Signed)
Pt with 2 episodes of talking clearly and then changing to  Mumbling for aprox 60mins then back to clear speech. During 2nd episode pt's left arm stiffened straight and a obs R mouth droop. NP/Brady updated, nursing will cont to monitor

## 2016-12-21 ENCOUNTER — Inpatient Hospital Stay (HOSPITAL_COMMUNITY): Payer: Medicare Other | Admitting: Occupational Therapy

## 2016-12-21 ENCOUNTER — Inpatient Hospital Stay (HOSPITAL_COMMUNITY): Payer: Medicare Other | Admitting: Speech Pathology

## 2016-12-21 ENCOUNTER — Inpatient Hospital Stay (HOSPITAL_COMMUNITY): Payer: Medicare Other | Admitting: Physical Therapy

## 2016-12-21 ENCOUNTER — Inpatient Hospital Stay (HOSPITAL_COMMUNITY): Payer: Medicare Other

## 2016-12-21 DIAGNOSIS — G8191 Hemiplegia, unspecified affecting right dominant side: Secondary | ICD-10-CM

## 2016-12-21 DIAGNOSIS — J9601 Acute respiratory failure with hypoxia: Secondary | ICD-10-CM

## 2016-12-21 DIAGNOSIS — I69391 Dysphagia following cerebral infarction: Secondary | ICD-10-CM

## 2016-12-21 DIAGNOSIS — E038 Other specified hypothyroidism: Secondary | ICD-10-CM

## 2016-12-21 DIAGNOSIS — E876 Hypokalemia: Secondary | ICD-10-CM | POA: Diagnosis present

## 2016-12-21 DIAGNOSIS — E669 Obesity, unspecified: Secondary | ICD-10-CM | POA: Diagnosis present

## 2016-12-21 DIAGNOSIS — E7800 Pure hypercholesterolemia, unspecified: Secondary | ICD-10-CM

## 2016-12-21 DIAGNOSIS — I610 Nontraumatic intracerebral hemorrhage in hemisphere, subcortical: Secondary | ICD-10-CM

## 2016-12-21 LAB — BASIC METABOLIC PANEL
Anion gap: 8 (ref 5–15)
BUN: 12 mg/dL (ref 6–20)
CO2: 22 mmol/L (ref 22–32)
Calcium: 8.2 mg/dL — ABNORMAL LOW (ref 8.9–10.3)
Chloride: 110 mmol/L (ref 101–111)
Creatinine, Ser: 0.82 mg/dL (ref 0.44–1.00)
GFR calc Af Amer: >60 mL/min (ref >60)
Glucose, Bld: 111 mg/dL — ABNORMAL HIGH (ref 65–99)
Potassium: 3.3 mmol/L — ABNORMAL LOW (ref 3.5–5.1)
Sodium: 140 mmol/L (ref 135–145)

## 2016-12-21 LAB — GLUCOSE, CAPILLARY
GLUCOSE-CAPILLARY: 100 mg/dL — AB (ref 65–99)
GLUCOSE-CAPILLARY: 103 mg/dL — AB (ref 65–99)
Glucose-Capillary: 98 mg/dL (ref 65–99)
Glucose-Capillary: 98 mg/dL (ref 65–99)

## 2016-12-21 LAB — POCT I-STAT 3, ART BLOOD GAS (G3+)
Acid-Base Excess: 4 mmol/L — ABNORMAL HIGH (ref 0.0–2.0)
Acid-Base Excess: 4 mmol/L — ABNORMAL HIGH (ref 0.0–2.0)
Bicarbonate: 27.9 mmol/L (ref 20.0–28.0)
Bicarbonate: 28.9 mmol/L — ABNORMAL HIGH (ref 20.0–28.0)
O2 SAT: 98 %
O2 SAT: 99 %
PCO2 ART: 37.5 mmHg (ref 32.0–48.0)
PCO2 ART: 44.3 mmHg (ref 32.0–48.0)
PH ART: 7.479 — AB (ref 7.350–7.450)
PH ART: 7.423 (ref 7.350–7.450)
PO2 ART: 149 mmHg — AB (ref 83.0–108.0)
Patient temperature: 98.6
Patient temperature: 98.6
TCO2: 29 mmol/L (ref 0–100)
TCO2: 30 mmol/L (ref 0–100)
pO2, Arterial: 97 mmHg (ref 83.0–108.0)

## 2016-12-21 LAB — CBC
HCT: 33.3 % — ABNORMAL LOW (ref 36.0–46.0)
Hemoglobin: 10.5 g/dL — ABNORMAL LOW (ref 12.0–15.0)
MCH: 29 pg (ref 26.0–34.0)
MCHC: 31.5 g/dL (ref 30.0–36.0)
MCV: 92 fL (ref 78.0–100.0)
Platelets: 257 10*3/uL (ref 150–400)
RBC: 3.62 MIL/uL — ABNORMAL LOW (ref 3.87–5.11)
RDW: 14.2 % (ref 11.5–15.5)
WBC: 9.7 10*3/uL (ref 4.0–10.5)

## 2016-12-21 MED ORDER — ORAL CARE MOUTH RINSE
15.0000 mL | Freq: Two times a day (BID) | OROMUCOSAL | Status: DC
  Administered 2016-12-21 – 2016-12-22 (×2): 15 mL via OROMUCOSAL

## 2016-12-21 MED ORDER — POTASSIUM CHLORIDE CRYS ER 20 MEQ PO TBCR
40.0000 meq | EXTENDED_RELEASE_TABLET | Freq: Once | ORAL | Status: AC
  Administered 2016-12-21: 40 meq via ORAL
  Filled 2016-12-21: qty 2

## 2016-12-21 NOTE — Progress Notes (Signed)
Progress Note    Valerie Mcclure  QBH:[id number] DOB: May 08, 1941  DOA: 12/19/2016 PCP: [name], MD    Brief Narrative:   Chief complaint: F/U opiate overdose  Medical records reviewed and are as summarized below:  Valerie Mcclure is an 76 y.o. female with a PMH of CKD, hypertension, hyperlipidemia, recent hospital admission 11/29/16-12/03/16 for treatment of an acute left MCA infarct status post mechanical thrombectomy complicated by nontraumatic subcortical hemorrhage of left basal ganglia hemisphere and hemorrhagic transformation with midline shift, discharged to [facility] who was doing well with rehabilitation until 12/19/16 when she became somnolent and had periods of apnea. A stat CT showed no new acute findings and improvement in the midline shift. She seemed to respond to repeated doses of Narcan, but in the evening, she had a personality change, becoming agitated and angry. She was readmitted by the critical care team to the ICU for initiation of a Narcan drip. The Narcan drip was subsequently weaned and her care was transferred to [facility] 12/21/16.  Assessment/Plan:   Principal Problem:   Narcotic overdose Resolved on a Narcan drip. Cautious narcotics in the future.  Active Problems:   Hypothyroidism Stable on Synthroid.    Hyperlipidemia Stable on Zocor.    Nontraumatic subcortical hemorrhage of left cerebral hemisphere ([name])/Right hemiparesis ([place]) CIR re-consulted for consideration of transfer back to their service.    Benign essential HTN Continue Lasix, Avapro, and nifedipine.    Dysphagia, post-stroke Continue aspiration precautions and thickened liquids.    Hypokalemia Replete.    Obesity Body mass index is 31.05 kg/m.   Family Communication/Anticipated D/C date and plan/Code Status   DVT prophylaxis: SCDs ordered. Code Status: Full Code.  Family Communication: No family currently at the bedside. Disposition Plan: Return to CIR.   Medical  Consultants:    PCCM   Procedures:    None  Anti-Infectives:    None  Subjective:   Patient has expressive aphasia and mixes up her words, but appears to be without complaint.  Objective:    Vitals:   12/21/16 0400 12/21/16 0500 12/21/16 0600 12/21/16 0700  BP: (!) 147/73 (!) 149/59 (!) 142/67 136/61  Pulse: (!) 59 62 71 61  Resp: 15 14 14 16   Temp: 97.6 F (36.4 C)     TempSrc: Oral     SpO2: 99% 100% 98% 99%  Weight:  77 kg (169 lb 12.1 oz)    Height:        Intake/Output Summary (Last 24 hours) at 12/21/16 0719 Last data filed at 12/21/16 0700  Gross per 24 hour  Intake           4059.1 ml  Output              900 ml  Net           3159.1 ml   Filed Weights   12/20/16 0500 12/21/16 0500  Weight: 75.1 kg (165 lb 9.1 oz) 77 kg (169 lb 12.1 oz)    Exam: General exam: Awake, alert, calm and comfortable. Respiratory system: Clear to auscultation bilaterally with normal respiratory effort. Cardiovascular system: Regular rate, and rhythm. No murmurs, rubs, gallops. Gastrointestinal system: Soft, nontender, nondistended with normal active bowel sounds. Central nervous system: Alert, unable to assess orientation due to dysarthric and slightly slurred speech. Right hemi-paresis. Extremities: No clubbing, edema, or cyanosis. Skin: Warm and dry, no rashes. Psychiatry: Mood and affect appear bright, unable to assess judgment/insight.   Data Reviewed:  I have personally reviewed following labs and imaging studies:  Labs: Basic Metabolic Panel:  Recent Labs Lab 12/18/16 1207 12/19/16 1534 12/19/16 2120 12/20/16 0141 12/21/16 0309  NA 137 137  --  138 140  K 4.9 4.5  --  4.4 3.3*  CL 101 102  --  103 110  CO2 26 25  --  27 22  GLUCOSE 89 115*  --  128* 111*  BUN 30* 31*  --  25* 12  CREATININE 1.18* 1.14*  --  1.21* 0.82  CALCIUM 8.9 9.2  --  8.7* 8.2*  MG  --   --  2.0  --   --   PHOS  --   --  4.7*  --   --    GFR Estimated Creatinine  Clearance: 55.2 mL/min (by C-G formula based on SCr of 0.82 mg/dL). Liver Function Tests: No results for input(s): AST, ALT, ALKPHOS, BILITOT, PROT, ALBUMIN in the last 168 hours. No results for input(s): LIPASE, AMYLASE in the last 168 hours. No results for input(s): AMMONIA in the last 168 hours. Coagulation profile No results for input(s): INR, PROTIME in the last 168 hours.  CBC:  Recent Labs Lab 12/14/16 0728 12/18/16 1207 12/19/16 1534 12/20/16 0141 12/21/16 0309  WBC 8.2 8.1 9.9 13.4* 9.7  NEUTROABS 5.6 5.2  --   --   --   HGB 9.3* 11.1* 11.9* 10.7* 10.5*  HCT 29.9* 35.2* 37.2 33.8* 33.3*  MCV 93.1 92.6 93.2 91.1 92.0  PLT 220 316 321 244 257   Cardiac Enzymes:  Recent Labs Lab 12/19/16 1534  CKTOTAL 21*  CKMB 1.8  TROPONINI <0.03   Sepsis Labs:  Recent Labs Lab 12/18/16 1207 12/19/16 1534 12/19/16 2120 12/19/16 2203 12/20/16 0141 12/21/16 0309  PROCALCITON  --   --  <0.10  --   --   --   WBC 8.1 9.9  --   --  13.4* 9.7  LATICACIDVEN  --   --   --  2.3* 1.5  --     Microbiology Recent Results (from the past 240 hour(s))  Culture, blood (routine x 2)     Status: None (Preliminary result)   Collection Time: 12/19/16  9:55 PM  Result Value Ref Range Status   Specimen Description BLOOD LEFT HAND  Final   Special Requests IN PEDIATRIC BOTTLE Blood Culture adequate volume  Final   Culture NO GROWTH < 24 HOURS  Final   Report Status PENDING  Incomplete    Radiology: Ct Head Wo Contrast  Result Date: 12/20/2016 CLINICAL DATA:  Acute onset of mental deterioration. Initial encounter. EXAM: CT HEAD WITHOUT CONTRAST TECHNIQUE: Contiguous axial images were obtained from the base of the skull through the vertex without intravenous contrast. COMPARISON:  CT of the head performed earlier today at 4:16 p.m. FINDINGS: Brain: The evolving subacute left MCA territory infarct is grossly unchanged from the recent prior study, with partial effacement of the left lateral  ventricle, and approximately 2 mm of rightward shift. Previously noted hemorrhage transformation at the left basal ganglia is stable in appearance. No new infarcts are identified. There is no evidence of mass lesion. There is no evidence of hydrocephalus at this time. Mild cerebellar atrophy is noted. The brainstem and fourth ventricle are unremarkable in appearance. Vascular: No hyperdense vessel or unexpected calcification. Skull: There is no evidence of fracture; visualized osseous structures are unremarkable in appearance. Sinuses/Orbits: The orbits are within normal limits. The paranasal sinuses and mastoid air cells  are well-aerated. Other: No significant soft tissue abnormalities are seen. IMPRESSION: Evolving subacute left MCA territory infarct is grossly unchanged from the recent prior study, with partial effacement of the left lateral ventricle, and approximately 2 mm of rightward shift. Previously noted hemorrhagic transformation at the left basal ganglia is stable in appearance. Electronically Signed   By: Garald Balding M.D.   On: 12/20/2016 00:21   Ct Head Wo Contrast  Result Date: 12/19/2016 CLINICAL DATA:  Loss consciousness for 10 seconds. EXAM: CT HEAD WITHOUT CONTRAST TECHNIQUE: Contiguous axial images were obtained from the base of the skull through the vertex without intravenous contrast. COMPARISON:  Brain MRI 11/30/2016 FINDINGS: Brain: Subacute left MCA territory infarct with cytotoxic edema involving the left basal ganglia, deep white matter tracts, and lateral temporal lobe primarily. There was hemorrhagic conversion at basal ganglia, with decreased clot mass effect and high-density since prior. High-density is now mainly seen at the level of the putamen. Midline shift now measures 1-2 mm, as compared to 7 mm previously. No acute hemorrhage. No evidence of progressive infarct. Vascular: Atherosclerotic calcification. No hyperdense vessel noted. Skull: No acute or aggressive finding.  Sinuses/Orbits: Negative IMPRESSION: 1. No acute finding. 2. Expected evolution of subacute left MCA territory infarct with hemorrhagic conversion. Midline shift is nearly resolved. Electronically Signed   By: Monte Fantasia M.D.   On: 12/19/2016 16:43   Dg Chest Port 1 View  Result Date: 12/21/2016 CLINICAL DATA:  Acute respiratory failure with hypoxia. EXAM: PORTABLE CHEST 1 VIEW COMPARISON:  12/19/2016 FINDINGS: Chronic cardiomegaly. Chronic aortic atherosclerosis. The lungs remain radiographically clear. No heart failure or effusion. No significant bone finding. IMPRESSION: No active disease. Electronically Signed   By: Nelson Chimes M.D.   On: 12/21/2016 07:01   Dg Chest Port 1 View  Result Date: 12/19/2016 CLINICAL DATA:  Respiratory depression EXAM: PORTABLE CHEST 1 VIEW COMPARISON:  12/01/2016 FINDINGS: Cardiac shadow is mildly enlarged. The lungs are well aerated bilaterally. No focal infiltrate or sizable effusion is seen. No bony abnormality is noted. IMPRESSION: No acute abnormality seen. Electronically Signed   By: Inez Catalina M.D.   On: 12/19/2016 18:50    Medications:   . chlorhexidine  15 mL Mouth Rinse BID  . FLUoxetine  20 mg Oral Daily  . furosemide  20 mg Oral Daily  . irbesartan  150 mg Oral Daily  . levothyroxine  88 mcg Oral QAC breakfast  . mouth rinse  15 mL Mouth Rinse q12n4p  . NIFEdipine  30 mg Oral Daily  . nortriptyline  20 mg Oral QHS  . potassium chloride  40 mEq Oral Once  . simvastatin  10 mg Oral q1800   Continuous Infusions: . sodium chloride 150 mL/hr (12/20/16 0000)  . sodium chloride 150 mL/hr at 12/21/16 0301    Medical decision making is of Mod complexity and this patient is at mod risk of deterioration, therefore this is a level 3 visit.  (> 4 problem points,  2 data points, mod risk: Need 2 out of 3)   Problems/DDx Points   Self limited or minor (max 2) (Hypokalemia, OD)       1  2  Established problem, stable (hypothyroid, HLD, HTN)       1   3  Established problem, worsening       2   New problem, no additional W/U planned (max 1)       3   New problem, additional W/U planned  4    Data Reviewed Points   Review/order clinical lab tests       1  1  Review/order x-rays       1  1  Review/order tests (Echo, EKG, PFTs, etc)       1   Discussion of test results w/ performing MD       1   Independent review of image, tracing or specimen       2   Decision to obtain old records       1   Review and summation of old records       2    Level of risk Presenting prob Diagnostics Management   Minimal 1 self limited/minor Labs CXR EKG/EEG U/A U/S Rest Gargles Bandages Dressings   Low 2 or more self limited/minor 1 stable chronic Acute uncomplicated illness Tests (PFTS) Non-CV imaging Arterial labs Biopsies of skin OTC drugs Minor surgery-no risk PT OT IVF without additives    Moderate 1 or more chronic illnesses w/ mild exac, progression or S/E from tx 2 or more stable chronic illnesses Undiagnosed new problem w/ uncertain prognosis Acute complicated injury  Stress tests Endoscopies with no risk factors Deep needle or incisional bx CV imaging without risk LP Thoracentesis Paracentesis Minor surgery w/ risks Elective major surgery w/ no risk (open, percutaneous or endoscopic) Prescription drugs Therapeutic nucl med IVF with additives Closed tx of fracture/dislocation    High Severe exac of chronic illness Acute or chronic illness/injury may pose a threat to life or bodily function (ARF) Change in neuro status    CV imaging w/ contrast and risk Cardio electophysiologic tests Endoscopies w/ risk Discography Elective major surgery Emergency major surgery Parenteral controlled substances Drug therapy req monitoring for toxicity DNR/de-escalation of care    MDM Prob points Data points Risk   Straightforward    <1    <1    Min   Low complexity    2    2    Low   Moderate    3    3    Mod   High  Complexity    4 or more    4 or more    High      LOS: 2 days   Telly Jawad  [facility] Pager [phone]. If unable to reach me by pager, please call my cell phone at [phone].  *Please refer to [email or web address], password [email or web address] to get updated schedule on who will round on this patient, as hospitalists switch teams weekly. If 7PM-7AM, please contact night-coverage at [email or web address], password [email or web address] for any overnight needs.  12/21/2016, 7:19 AM

## 2016-12-21 NOTE — Progress Notes (Signed)
Rehab admissions - Patient known to me from recent inpatient rehab admission.  I will need PT and OT re evaluations so that I can send to [facility] for review.  Please place orders for PT and OT.  I will follow up again tomorrow.  Call me for questions.  #[phone]

## 2016-12-21 NOTE — Discharge Summary (Signed)
Physician Discharge Summary  Patient ID: Valerie Mcclure MRN: [id number] DOB/AGE: Dec 09, 1940 76 y.o.  Admit date: 12/03/2016 Discharge date: 12/19/2016  Discharge Diagnoses:  Principal Problem:   Encephalopathy Active Problems:   CKD (chronic kidney disease)   Acute ischemic left MCA stroke ([name])   Right hemiparesis ([name])   Aphasia as late effect of stroke   Anemia of chronic disease   Benign essential HTN   Dysphagia, post-stroke   Expressive aphasia   E-coli UTI   Spastic hemiplegia of right dominant side as late effect of cerebral infarction [facility])   Discharged Condition: Guarded.   Significant Diagnostic Studies:  Dg Chest Port 1 View  Result Date: 12/19/2016 CLINICAL DATA:  Respiratory depression EXAM: PORTABLE CHEST 1 VIEW COMPARISON:  12/01/2016 FINDINGS: Cardiac shadow is mildly enlarged. The lungs are well aerated bilaterally. No focal infiltrate or sizable effusion is seen. No bony abnormality is noted. IMPRESSION: No acute abnormality seen. Electronically Signed   By: Inez Catalina M.D.   On: 12/19/2016 18:50      Labs:  Basic Metabolic Panel:  Recent Labs Lab 12/18/16 1207 12/19/16 1534 12/19/16 2120  NA 137 137  --   K 4.9 4.5  --   CL 101 102  --   CO2 26 25  --   GLUCOSE 89 115*  --   BUN 30* 31*  --   CREATININE 1.18* 1.14*  --   CALCIUM 8.9 9.2  --   MG  --   --  2.0  PHOS  --   --  4.7*    CBC:  Recent Labs Lab 12/18/16 1207 12/19/16 1534  WBC 8.1 9.9  NEUTROABS 5.2  --   HGB 11.1* 11.9*  HCT 35.2* 37.2  MCV 92.6 93.2  PLT 316 321    CBG:  Recent Labs Lab 12/20/16 0020 12/20/16 2006 12/20/16 2347 12/21/16 0418  GLUCAP 98 103* 100* 98    Brief HPI:   Valerie Mcclure a 76 y.o.femalewith history of chronic abdominal and LBP, CKD, hyperlipidemia who fell out of bed on 5/13 am and was found to have left facial weakness with slurred speech and right sided weakness. CT head done revealing hyperdense L-MCA sign with  hypodensity in left temporal area. CTA showed left M1 occlusion due to acute thrombus or embolus with significant penumbra left temporoparietal lobe and irregular non-calcified plaque L-ICA. She underwent cerebral angio with complete revascularization of L-MCA with reperfusion. Follow up CCT showed hemorrhagic transformation of left temporal infarct, large area of hemorrhage and extravasated contrast in left basal ganglia, minimal SDH along tentorium and 6 mm midline shift to right. MRI brain L-MCA infarct with diffusion restriction left temporal lobe, superimposed 5.5 cm intra-axial hemorrhage centered at left basal ganglia with associated left hemisphere edema, effaced left lateral ventricle with trace IVH and occasional small foci of restriction in left thalamus and anterior left occipital lobe.   She was extubated 5/14 pm and Dr. Acie Fredrickson consulted for input on new onset A fib on telemetry. EKG without evidence of A fib. 2 D echo showed EF 55-60% with mild mitral regurg. He felt that patient with multifocal PACs and might be having bouts of A fib due to stress v/s sinus arrhthymias. Question event monitor v/s loop to evaluate for arrhthymias--to have 30 day monitor at discharge and loop recorder if monitor unrevealing. BLE ultrasound without DVT--right groin limited due to bandage. FEES done showing silent penetration with liquids--D1, honey by teaspoon recommended due to dysphagia. Dr.  Xu felt that stroke embolic due to unknown source and to start ASA on 5/24 for secondary stroke prevention. Speech evaluation revealed moderate dysarthria with expressive >receptive aphasia with phonemic paraphasias and anomia. Patient with resultant right sided weakness with right lean, right inattention, motor apraxia and DOE. CIR recommended for follow up therapy   Hospital Course: Valerie Mcclure was admitted to rehab 12/03/2016 for inpatient therapies to consist of PT, ST and OT at least three hours five days  a week. Past admission physiatrist, therapy team and rehab RN have worked together to provide customized collaborative inpatient rehab. She was maintained on ASA for secondary stroke prevention and has tolerated this without side effects. She was started on Bactrim for E coli UTI and renal status has been monitored serially and has been stable.  Blood pressures were noted to be labile and Avapro was titrated upwards.  Pain has been reasonable controlled on MS contin bid. Her diet was advanced to dysphagia 2, nectar liquids with water protocol on 5/22. Baclofen was added briefly to help manage spasticity but was discontinued due to sedation. Therapy has been focused on right inattention as well as weight bearing RLE. She has required cues for sequencing ADL tasks as well as mobility. She required min to mod assist with ADL tasks and mobility. She required supervision with cues to maintain aspiration precautions and functional communication was limited due to global aphasia with apraxia. On 6/2, she developed somnolence with hypoxia. She responded to multiple doses of narcan and stat CT head showed improvement in midline shift. Due to lethargy and episodes of apnea, she was transferred to ICU for work up and closer monitoring.     Disposition:  ICU  Diet: NPO   Allergies as of 12/19/2016      Reactions   Shrimp [shellfish Allergy]    Break outs and swelling    Tandem Plus [fefum-fepo-fa-b Cmp-[id number]] Nausea And Vomiting   Ivp Dye [iodinated Diagnostic Agents] Rash   itching   Penicillins Rash   itching      Medication List    STOP taking these medications   estradiol 0.5 MG tablet Commonly known as:  ESTRACE     TAKE these medications   CALCIUM 1200 PO Take by mouth daily.   vitamin B-12 100 MCG tablet Commonly known as:  CYANOCOBALAMIN Take 100 mcg by mouth daily.   Vitamin D3 1000 units Caps Take by mouth daily.     ASK your doctor about these medications   FLUoxetine 20 MG  capsule Commonly known as:  PROZAC Take 1 capsule (20 mg total) by mouth daily.   furosemide 20 MG tablet Commonly known as:  LASIX TAKE 1 TABLET BY MOUTH DAILY AS NEEDED   levothyroxine 88 MCG tablet Commonly known as:  SYNTHROID, LEVOTHROID Take 1 tablet (88 mcg total) by mouth daily.   nortriptyline 10 MG capsule Commonly known as:  PAMELOR TAKE 2 CAPSULES BY MOUTH AT BEDTIME   simvastatin 10 MG tablet Commonly known as:  ZOCOR TAKE 1 TABLET(10 MG) BY MOUTH DAILY   valsartan 160 MG tablet Commonly known as:  DIOVAN Take 1 tablet (160 mg total) by mouth daily.      Follow-up Information    Jamse Arn, MD Follow up.   Specialty:  Physical Medicine and Rehabilitation Why:  office will call you with follow up appointment.  Contact information: [address] [place] [address] [phone]        Erlinda Hong,  Jindong, MD. Call in 1 day(s).   Specialty:  Neurology Why:  for follow up appointment in 4 weeks Contact information: [address] [place] [address] [place] Follow up.   Contact information: [place] [address]          Signed: Bary Leriche 12/23/2016, 5:54 PM

## 2016-12-21 NOTE — Progress Notes (Signed)
  Speech Language Pathology Treatment: Dysphagia  Patient Details Name: Valerie Mcclure MRN: [id number] DOB: May 11, 1941 Today's Date: 12/21/2016 Time: 1025-1036 SLP Time Calculation (min) (ACUTE ONLY): 11 min  Assessment / Plan / Recommendation Clinical Impression  Skilled treatment session focused on addressing dysphagia goals. Upon SLP arrival RN completing oral care.  SLP facilitated session by providing skilled observation of patient consuming thin liquids via teaspoon with no overt s/s of aspiration in 6/6 trials.  Patient then consumed thin liquids via cup with Mod assist verbal and visual cues to consume small sips at a slow rate.  More cues needed for pacing today, which resulted in not allowing for multiple swallows ane immediate cough after 5 sips.  Patient was observed to consume Dys.2 textures and nectar-thick liquids via cup with no overt s/s of aspiration with Min assist verbal and visual cues for use of safe swallow strategies.  Patient appears to be at baseline from swallowing standpoint.  Continue with current plan of care.   Additionally, patient was able to communicate basic wants and needs with multi-modal communication; ~80% accuracy yes/no, she pointed and used single words to make needs known.  Patient also appears to have returned to her communication baseline, from before this acute transfer.  As a result will defer SLE at this time in hopes of patient returning to [place].     HPI HPI: 76yo CF with acute L MCA CVA with secondary hemorrhage transferred from rehab for acute encephalopathy likely toxic 2/2 narcotic accumulation given her response to narcan. CT head demonstrates stability - improvement and other metabolic indices/septic work up has been unrevealing. Pt receiving skilled ST in CIR for dysphagia and aphasia, had advanced to D2/nectar thick liquids prior to this admission. Recent MBS 12/08/16 findings mild-moderate oropharyngeal dysphagia with motor planning and  sensorimotor deficits, recommended Dys 2/nectar with full supervision, meds whole in puree.      SLP Plan  Continue with current plan of care       Recommendations  Diet recommendations: Dysphagia 2 (fine chop);Nectar-thick liquid Liquids provided via: Cup;No straw Medication Administration: Whole meds with puree Supervision: Patient able to self feed;Full supervision/cueing for compensatory strategies Compensations: Slow rate;Small sips/bites;Monitor for anterior loss;Multiple dry swallows after each bite/sip;Follow solids with liquid Postural Changes and/or Swallow Maneuvers: Seated upright 90 degrees;Upright 30-60 min after meal                Oral Care Recommendations: Oral care BID Follow up Recommendations: Inpatient Rehab SLP Visit Diagnosis: Dysphagia, oropharyngeal phase (R13.12) Plan: Continue with current plan of care       GO               Carmelia Roller., CCC-SLP [phone]  [place] 12/21/2016, 10:42 AM

## 2016-12-22 ENCOUNTER — Encounter (HOSPITAL_COMMUNITY): Payer: Self-pay

## 2016-12-22 DIAGNOSIS — I4892 Unspecified atrial flutter: Secondary | ICD-10-CM | POA: Diagnosis present

## 2016-12-22 DIAGNOSIS — G92 Toxic encephalopathy: Secondary | ICD-10-CM | POA: Diagnosis present

## 2016-12-22 DIAGNOSIS — I69351 Hemiplegia and hemiparesis following cerebral infarction affecting right dominant side: Secondary | ICD-10-CM

## 2016-12-22 DIAGNOSIS — G929 Unspecified toxic encephalopathy: Secondary | ICD-10-CM | POA: Diagnosis present

## 2016-12-22 LAB — BASIC METABOLIC PANEL
ANION GAP: 10 (ref 5–15)
BUN: 9 mg/dL (ref 6–20)
CO2: 22 mmol/L (ref 22–32)
CREATININE: 0.9 mg/dL (ref 0.44–1.00)
Calcium: 8.4 mg/dL — ABNORMAL LOW (ref 8.9–10.3)
Chloride: 109 mmol/L (ref 101–111)
GFR calc Af Amer: >60 mL/min (ref >60)
GFR calc non Af Amer: >60 mL/min (ref >60)
GLUCOSE: 114 mg/dL — AB (ref 65–99)
Potassium: 3.4 mmol/L — ABNORMAL LOW (ref 3.5–5.1)
Sodium: 141 mmol/L (ref 135–145)

## 2016-12-22 NOTE — Progress Notes (Signed)
Progress Note    Valerie Mcclure  XFG:[id number] DOB: [birth year]  DOA: 12/19/2016 PCP: [name], MD    Brief Narrative:   Chief complaint: F/U opiate overdose  Medical records reviewed and are as summarized below:  Valerie Mcclure is an 76 y.o. female with a PMH of CKD, hypertension, hyperlipidemia, recent hospital admission 11/29/16-12/03/16 for treatment of an acute left MCA infarct status post mechanical thrombectomy complicated by nontraumatic subcortical hemorrhage of left basal ganglia hemisphere and hemorrhagic transformation with midline shift, discharged to [place] who was doing well with rehabilitation until 12/19/16 when she became somnolent and had periods of apnea. A stat CT showed no new acute findings and improvement in the midline shift. She seemed to respond to repeated doses of Narcan, but in the evening, she had a personality change, becoming agitated and angry. She was readmitted by the critical care team to the ICU for initiation of a Narcan drip. The Narcan drip was subsequently weaned and her care was transferred to [facility] 12/21/16.  Assessment/Plan:   Principal Problem:   Toxic encephalopathy secondary to Narcotic overdose Resolved on a Narcan drip. Cautious narcotics in the future. Follow-up blood cultures, though no evidence of sepsis.  Active Problems:   Atrial flutter with variable AV block Noted to have irregular heart sounds today. Reviewed prior EKG from 12/19/16 which showed atrial flutter with variable AV block. The patient is likely not a candidate for blood thinners given her recent hemorrhagic stroke. I reviewed her previous cardiology consult note by Dr. Acie Fredrickson from 12/01/16 who recommends neurology/neurosurgery clearance prior to initiating anticoagulation.    Hypothyroidism Stable on Synthroid.    Hyperlipidemia Stable on Zocor.    Nontraumatic subcortical hemorrhage of left cerebral hemisphere ([name])/Right hemiparesis ([place]) CIR re-consulted for  consideration of transfer back to their service. Need PT/OT reevaluation and insurance approval.    Benign essential HTN Stable on Lasix, Avapro, and nifedipine.    Dysphagia, post-stroke Continue aspiration precautions and thickened liquids. Stable.    Hypokalemia Repleted. Recheck potassium today.    Obesity Body mass index is 30.36 kg/m.   Family Communication/Anticipated D/C date and plan/Code Status   DVT prophylaxis: SCDs ordered. Code Status: Full Code.  Family Communication: Husband updated at the bedside. Disposition Plan: Return to CIR when approved.   Medical Consultants:    PCCM   Procedures:    None  Anti-Infectives:    None  Subjective:   Speech clearer today, no complaints. Anxious to get back to therapy.  Objective:    Vitals:   12/22/16 0300 12/22/16 0417 12/22/16 0447 12/22/16 0746  BP:   (!) 118/56 (!) 146/66  Pulse: 91  (!) 113 99  Resp: 19  18 19   Temp:   98.3 F (36.8 C) 97.7 F (36.5 C)  TempSrc:   Oral Oral  SpO2: 96%  99% 98%  Weight:  75.3 kg (166 lb)    Height:        Intake/Output Summary (Last 24 hours) at 12/22/16 0751 Last data filed at 12/22/16 9678  Gross per 24 hour  Intake             4035 ml  Output             1650 ml  Net             2385 ml   Filed Weights   12/20/16 0500 12/21/16 0500 12/22/16 0417  Weight: 75.1 kg (165 lb 9.1 oz) 77  kg (169 lb 12.1 oz) 75.3 kg (166 lb)    Exam: General exam: Unchanged from 12/21/16: Awake, alert, calm and comfortable. Respiratory system: Unchanged from 12/21/16: Clear to auscultation bilaterally with normal respiratory effort. Cardiovascular system: Heart sounds are irregularly irregular. No murmurs, rubs, gallops. Gastrointestinal system: Soft, nontender, nondistended with normal active bowel sounds. Central nervous system: Alert, unable to assess orientation due to dysarthric and slightly slurred speech. Right hemi-paresis. Extremities: No clubbing, edema, or  cyanosis. Skin: Warm and dry, no rashes. Psychiatry: Mood and affect appear bright, unable to assess judgment/insight.   Data Reviewed:   I have personally reviewed following labs and imaging studies:  Labs: Basic Metabolic Panel:  Recent Labs Lab 12/18/16 1207 12/19/16 1534 12/19/16 2120 12/20/16 0141 12/21/16 0309  NA 137 137  --  138 140  K 4.9 4.5  --  4.4 3.3*  CL 101 102  --  103 110  CO2 26 25  --  27 22  GLUCOSE 89 115*  --  128* 111*  BUN 30* 31*  --  25* 12  CREATININE 1.18* 1.14*  --  1.21* 0.82  CALCIUM 8.9 9.2  --  8.7* 8.2*  MG  --   --  2.0  --   --   PHOS  --   --  4.7*  --   --    GFR Estimated Creatinine Clearance: 54.6 mL/min (by C-G formula based on SCr of 0.82 mg/dL).  CBC:  Recent Labs Lab 12/18/16 1207 12/19/16 1534 12/20/16 0141 12/21/16 0309  WBC 8.1 9.9 13.4* 9.7  NEUTROABS 5.2  --   --   --   HGB 11.1* 11.9* 10.7* 10.5*  HCT 35.2* 37.2 33.8* 33.3*  MCV 92.6 93.2 91.1 92.0  PLT 316 321 244 257   Cardiac Enzymes:  Recent Labs Lab 12/19/16 1534  CKTOTAL 21*  CKMB 1.8  TROPONINI <0.03   Sepsis Labs:  Recent Labs Lab 12/18/16 1207 12/19/16 1534 12/19/16 2120 12/19/16 2203 12/20/16 0141 12/21/16 0309  PROCALCITON  --   --  <0.10  --   --   --   WBC 8.1 9.9  --   --  13.4* 9.7  LATICACIDVEN  --   --   --  2.3* 1.5  --     Microbiology Recent Results (from the past 240 hour(s))  Culture, blood (routine x 2)     Status: None (Preliminary result)   Collection Time: 12/19/16  9:55 PM  Result Value Ref Range Status   Specimen Description BLOOD LEFT HAND  Final   Special Requests IN PEDIATRIC BOTTLE Blood Culture adequate volume  Final   Culture NO GROWTH 2 DAYS  Final   Report Status PENDING  Incomplete  Culture, blood (routine x 2)     Status: None (Preliminary result)   Collection Time: 12/19/16 10:00 PM  Result Value Ref Range Status   Specimen Description BLOOD RIGHT ANTECUBITAL  Final   Special Requests   Final     BOTTLES DRAWN AEROBIC AND ANAEROBIC Blood Culture adequate volume   Culture NO GROWTH 1 DAY  Final   Report Status PENDING  Incomplete    Radiology: Dg Chest Port 1 View  Result Date: 12/21/2016 CLINICAL DATA:  Acute respiratory failure with hypoxia. EXAM: PORTABLE CHEST 1 VIEW COMPARISON:  12/19/2016 FINDINGS: Chronic cardiomegaly. Chronic aortic atherosclerosis. The lungs remain radiographically clear. No heart failure or effusion. No significant bone finding. IMPRESSION: No active disease. Electronically Signed   By: Elta Guadeloupe  Shogry M.D.   On: 12/21/2016 07:01    Medications:   . chlorhexidine  15 mL Mouth Rinse BID  . FLUoxetine  20 mg Oral Daily  . furosemide  20 mg Oral Daily  . irbesartan  150 mg Oral Daily  . levothyroxine  88 mcg Oral QAC breakfast  . mouth rinse  15 mL Mouth Rinse BID  . NIFEdipine  30 mg Oral Daily  . nortriptyline  20 mg Oral QHS  . simvastatin  10 mg Oral q1800   Continuous Infusions: . sodium chloride Stopped (12/21/16 0909)  . sodium chloride 150 mL/hr at 12/22/16 4098    Medical decision making is of Mod complexity and this patient is at mod risk of deterioration, therefore this is a level 3 visit.  (> 4 problem points,  0 data points, mod risk: Need 2 out of 3)   Problems/DDx Points   Self limited or minor (max 2)       1    Established problem, stable (hypothyroid, HLD, HTN, atrial flutter)       1  4  Established problem, worsening       2   New problem, no additional W/U planned (max 1)       3   New problem, additional W/U planned        4    Data Reviewed Points   Review/order clinical lab tests       1    Review/order x-rays       1    Review/order tests (Echo, EKG, PFTs, etc)       1   Discussion of test results w/ performing MD       1   Independent review of image, tracing or specimen       2   Decision to obtain old records       1   Review and summation of old records       2    Level of risk Presenting prob Diagnostics  Management   Minimal 1 self limited/minor Labs CXR EKG/EEG U/A U/S Rest Gargles Bandages Dressings   Low 2 or more self limited/minor 1 stable chronic Acute uncomplicated illness Tests (PFTS) Non-CV imaging Arterial labs Biopsies of skin OTC drugs Minor surgery-no risk PT OT IVF without additives    Moderate 1 or more chronic illnesses w/ mild exac, progression or S/E from tx 2 or more stable chronic illnesses Undiagnosed new problem w/ uncertain prognosis Acute complicated injury  Stress tests Endoscopies with no risk factors Deep needle or incisional bx CV imaging without risk LP Thoracentesis Paracentesis Minor surgery w/ risks Elective major surgery w/ no risk (open, percutaneous or endoscopic) Prescription drugs Therapeutic nucl med IVF with additives Closed tx of fracture/dislocation    High Severe exac of chronic illness Acute or chronic illness/injury may pose a threat to life or bodily function (ARF) Change in neuro status    CV imaging w/ contrast and risk Cardio electophysiologic tests Endoscopies w/ risk Discography Elective major surgery Emergency major surgery Parenteral controlled substances Drug therapy req monitoring for toxicity DNR/de-escalation of care    MDM Prob points Data points Risk   Straightforward    <1    <1    Min   Low complexity    2    2    Low   Moderate    3    3    Mod   High Complexity    4  or more    4 or more    High      LOS: 3 days   [facility] Pager [phone]. If unable to reach me by pager, please call my cell phone at [phone].  *Please refer to [email or web address], password [email or web address] to get updated schedule on who will round on this patient, as hospitalists switch teams weekly. If 7PM-7AM, please contact night-coverage at [email or web address], password [email or web address] for any overnight needs.  12/22/2016, 7:51 AM

## 2016-12-22 NOTE — Evaluation (Signed)
Occupational Therapy Evaluation Patient Details Name: Valerie Mcclure MRN: [id number] DOB: February 03, 1941 Today's Date: 12/22/2016    History of Present Illness Patient is a 76 yo female with a recent hospitalization due to Lt MCA infarct with basal ganglia hematoma.  Patient was discharged to [facility] on 12/03/16.   Patient returned to acute hospital on 12/19/16 with toxic encephalopathy.     PMH:  CKD, HTN, HLD, Lt MCA infarct 11/29/16   Clinical Impression   Pt presented to OT evaluation willing and eager to participate with therapy. She was able to complete grooming tasks seated at EOB with moderate assistance to incorporate R UE into activity and min guard assist for balance. Pt presents with significant communication deficits impacting ability to fully assess cognition. She required multimodal cues for sequencing during all activities. She presents with R sided weakness, decreased coordination, and poor motor planning skills impacting ability to participate in ADL tasks. Recommend return to CIR for continued therapies in order to maximize independence with ADL and functional mobility.     Follow Up Recommendations  CIR;Supervision/Assistance - 24 hour    Equipment Recommendations  Other (comment) (TBD at next venue of care)    Recommendations for Other Services Rehab consult     Precautions / Restrictions Precautions Precautions: Fall Precaution Comments: Rt hemiparesis Restrictions Weight Bearing Restrictions: No      Mobility Bed Mobility Overal bed mobility: Needs Assistance Bed Mobility: Rolling;Sidelying to Sit;Sit to Sidelying Rolling: Min assist Sidelying to sit: Mod assist     Sit to sidelying: Max assist General bed mobility comments: Verbal and tactile cues for sequencing. Multimodal cues for use of R UE.  Transfers                 General transfer comment: NT this session    Balance Overall balance assessment: Needs  assistance Sitting-balance support: Single extremity supported;Feet supported Sitting balance-Leahy Scale: Fair Sitting balance - Comments: Min guard for EOB ADL.                                   ADL either performed or assessed with clinical judgement   ADL Overall ADL's : Needs assistance/impaired Eating/Feeding: Minimal assistance;Sitting   Grooming: Sitting;Moderate assistance;Applying deodorant;Wash/dry face;Wash/dry hands (and applying lotion) Grooming Details (indicate cue type and reason): Mod assist to incorporate R UE into activity and for thoroughness when applying lotion and deoderant. Upper Body Bathing: Moderate assistance;Sitting   Lower Body Bathing: Maximal assistance;Sitting/lateral leans   Upper Body Dressing : Moderate assistance;Sitting   Lower Body Dressing: Maximal assistance;Sitting/lateral leans                 General ADL Comments: Pt able to complete ADL seated at EOB. She demonstrates decreased dynamic sitting balance, ataxic movement, and poor motor planning as well as R hemiparesis impacting ability to complete ADL.     Vision Baseline Vision/History: Wears glasses Wears Glasses: Reading only Patient Visual Report: No change from baseline Vision Assessment?: Vision impaired- to be further tested in functional context Additional Comments: Pt with difficulty sequencing and problem solving to complete visual assessment. Did track to B sides of visual field this session during functional activity but demonstrates slight L gaze preference.      Perception     Praxis Praxis Praxis tested?: Deficits Deficits: Ideomotor;Ideation;Organization Praxis-Other Comments: Deficits noted in reaching and in completing tasks on command such as "place your  hand on your lap."    Pertinent Vitals/Pain Pain Assessment: No/denies pain     Hand Dominance Right   Extremity/Trunk Assessment Upper Extremity Assessment Upper Extremity Assessment:  RUE deficits/detail RUE Deficits / Details: Pain with PROM greater than 90 degrees in forward flexion and abduction. Decreased grasp and elbow flexion/extension strength to 3+/5 grossly. Shoulder AROM limited to 30 degrees in flexion and abduction.    Lower Extremity Assessment Lower Extremity Assessment: Defer to PT evaluation       Communication Communication Communication: Expressive difficulties   Cognition Arousal/Alertness: Awake/alert Behavior During Therapy: WFL for tasks assessed/performed Overall Cognitive Status: Impaired/Different from baseline Area of Impairment: Attention;Following commands;Safety/judgement;Awareness;Problem solving                   Current Attention Level: Selective   Following Commands: Follows one step commands consistently;Follows multi-step commands inconsistently Safety/Judgement: Decreased awareness of safety;Decreased awareness of deficits Awareness: Intellectual Problem Solving: Slow processing;Decreased initiation;Difficulty sequencing General Comments: Able to follow one-step commands consistently. Significant word finding deficits impacting ability to assess cognition. Able to tell me that she is in [place] when given multiple choice options.    General Comments       Exercises Exercises: General Upper Extremity General Exercises - Upper Extremity Shoulder Flexion: AAROM;Right;10 reps;Seated Shoulder ABduction: AAROM;Right;10 reps;Seated Elbow Flexion: AAROM;Right;10 reps;Seated Wrist Flexion: AAROM;Right;10 reps;Seated Wrist Extension: AAROM;Right;10 reps;Seated   Shoulder Instructions      Home Living Family/patient expects to be discharged to:: Private residence Living Arrangements: Spouse/significant other;Children Available Help at Discharge: Family;Available 24 hours/day Type of Home: Apartment Home Access: Level entry     Home Layout: One level     Bathroom Shower/Tub: Tub/shower unit         Home  Equipment: Cane - single point          Prior Functioning/Environment Level of Independence: Independent with assistive device(s)        Comments: Has been at CIR prior to current admission. Prior to recent admit for CVA, patient occasionally used cane for ambulation.        OT Problem List: Decreased strength;Decreased activity tolerance;Decreased range of motion;Impaired balance (sitting and/or standing);Impaired vision/perception;Decreased coordination;Decreased cognition;Decreased safety awareness;Decreased knowledge of use of DME or AE;Impaired sensation;Impaired tone;Impaired UE functional use;Decreased knowledge of precautions      OT Treatment/Interventions: Self-care/ADL training;Therapeutic exercise;Energy conservation;DME and/or AE instruction;Therapeutic activities;Cognitive remediation/compensation;Visual/perceptual remediation/compensation;Patient/family education;Balance training    OT Goals(Current goals can be found in the care plan section) Acute Rehab OT Goals Patient Stated Goal: To go back to rehab soon OT Goal Formulation: With patient/family Time For Goal Achievement: 01/05/17 Potential to Achieve Goals: Good ADL Goals Pt Will Perform Grooming: with min guard assist;standing Pt Will Perform Upper Body Dressing: with min guard assist;sitting Pt Will Perform Lower Body Dressing: with min assist;sit to/from stand Pt Will Transfer to Toilet: with min assist;stand pivot transfer;bedside commode Pt Will Perform Toileting - Clothing Manipulation and hygiene: with min assist;sit to/from stand Pt/caregiver will Perform Home Exercise Program: Right Upper extremity;Increased ROM;Increased strength;With minimal assist;With written HEP provided Additional ADL Goal #1: Pt will demonstrate emergent awareness during morning ADL routine.   OT Frequency: Min 3X/week   Barriers to D/C:            Co-evaluation              AM-PAC PT "6 Clicks" Daily Activity      Outcome Measure Help from another person eating meals?: A Little Help from another  person taking care of personal grooming?: A Lot Help from another person toileting, which includes using toliet, bedpan, or urinal?: A Lot Help from another person bathing (including washing, rinsing, drying)?: A Lot Help from another person to put on and taking off regular upper body clothing?: A Lot Help from another person to put on and taking off regular lower body clothing?: A Lot 6 Click Score: 13   End of Session Nurse Communication: Mobility status  Activity Tolerance: Patient tolerated treatment well Patient left: in bed;with call bell/phone within reach;with family/visitor present  OT Visit Diagnosis: Cognitive communication deficit (R41.841);Hemiplegia and hemiparesis Hemiplegia - Right/Left: Right Hemiplegia - dominant/non-dominant: Dominant Hemiplegia - caused by: Cerebral infarction                Time: 1440-1506 OT Time Calculation (min): 26 min Charges:  OT General Charges $OT Visit: 1 Procedure OT Evaluation $OT Eval Moderate Complexity: 1 Procedure OT Treatments $Self Care/Home Management : 8-22 mins G-Codes:     Norman Herrlich, MS OTR/L  Pager: [place] [facility] [place] 12/22/2016, 5:13 PM

## 2016-12-22 NOTE — Evaluation (Signed)
Physical Therapy Evaluation Patient Details Name: Valerie Mcclure MRN: [id number] DOB: 03/26/1941 Today's Date: 12/22/2016   History of Present Illness  Patient is a 76 yo female with a recent hospitalization due to Lt MCA infarct with basal ganglia hematoma.  Patient was discharged to [facility] on 12/03/16.   Patient returned to acute hospital on 12/19/16 with toxic encephalopathy.     PMH:  CKD, HTN, HLD, Lt MCA infarct 11/29/16  Clinical Impression  Patient presents with problems listed below.  Will benefit from acute PT to maximize functional mobility prior to discharge.  Recommend patient return to Inpatient Rehab for continued therapy.    Follow Up Recommendations CIR;Supervision for mobility/OOB    Equipment Recommendations  Other (comment) (TBD)    Recommendations for Other Services Rehab consult     Precautions / Restrictions Precautions Precautions: Fall Precaution Comments: Rt hemiparesis Restrictions Weight Bearing Restrictions: No      Mobility  Bed Mobility Overal bed mobility: Needs Assistance Bed Mobility: Rolling;Sidelying to Sit;Sit to Sidelying Rolling: Min assist Sidelying to sit: Mod assist     Sit to sidelying: Mod assist General bed mobility comments: Verbal and tactile cues for technique.  Assist to bring trunk to sitting position.  Required tactile cues to try to use RUE during functional tasks.  Min guard to min assist for sitting balance with posterior lean.  Multimodal cues to return to sidelying.  Transfers Overall transfer level: Needs assistance Equipment used: 2 person hand held assist Transfers: Sit to/from Stand Sit to Stand: Mod assist;+2 physical assistance         General transfer comment: Assist to rise to standing and for balance.  Patient with posterior and Rt lateral lean.  Patient able to stand for 2 minutes x2.  On second try, patient attempted to sidestep toward [facility].  Required max assist to maintain balance with  steps.  Ambulation/Gait             General Gait Details: NT  Stairs            Wheelchair Mobility    Modified Rankin (Stroke Patients Only)       Balance Overall balance assessment: Needs assistance Sitting-balance support: Single extremity supported;Feet supported Sitting balance-Leahy Scale: Poor   Postural control: Posterior lean Standing balance support: Bilateral upper extremity supported Standing balance-Leahy Scale: Poor Standing balance comment: Posterior and Rt lateral lean.                             Pertinent Vitals/Pain Pain Assessment: No/denies pain    Home Living Family/patient expects to be discharged to:: Private residence Living Arrangements: Spouse/significant other;Children Available Help at Discharge: Family;Available 24 hours/day Type of Home: Apartment Home Access: Level entry     Home Layout: One level Home Equipment: Cane - single point      Prior Function Level of Independence: Independent with assistive device(s)         Comments: Prior to recent admit for CVA, patient occasionally used cane for ambulation.     Hand Dominance   Dominant Hand: Right    Extremity/Trunk Assessment   Upper Extremity Assessment Upper Extremity Assessment: Defer to OT evaluation    Lower Extremity Assessment Lower Extremity Assessment: RLE deficits/detail RLE Deficits / Details: Strength grossly 3/5.  Decreased coordination.  RLE Coordination: decreased gross motor       Communication   Communication: Expressive difficulties  Cognition Arousal/Alertness: Awake/alert Behavior During  Therapy: WFL for tasks assessed/performed Overall Cognitive Status: Difficult to assess                                        General Comments      Exercises     Assessment/Plan    PT Assessment Patient needs continued PT services  PT Problem List Decreased strength;Decreased balance;Decreased  mobility;Decreased cognition;Decreased knowledge of use of DME;Decreased safety awareness       PT Treatment Interventions DME instruction;Gait training;Functional mobility training;Therapeutic activities;Balance training;Therapeutic exercise;Neuromuscular re-education;Cognitive remediation;Patient/family education    PT Goals (Current goals can be found in the Care Plan section)  Acute Rehab PT Goals Patient Stated Goal: To go back to rehab soon PT Goal Formulation: With patient/family Time For Goal Achievement: 12/29/16 Potential to Achieve Goals: Good    Frequency Min 3X/week   Barriers to discharge        Co-evaluation               AM-PAC PT "6 Clicks" Daily Activity  Outcome Measure Difficulty turning over in bed (including adjusting bedclothes, sheets and blankets)?: Total Difficulty moving from lying on back to sitting on the side of the bed? : Total Difficulty sitting down on and standing up from a chair with arms (e.g., wheelchair, bedside commode, etc,.)?: Total Help needed moving to and from a bed to chair (including a wheelchair)?: A Lot Help needed walking in hospital room?: A Lot Help needed climbing 3-5 steps with a railing? : A Lot 6 Click Score: 9    End of Session Equipment Utilized During Treatment: Gait belt Activity Tolerance: Patient tolerated treatment well Patient left: in bed;with call bell/phone within reach;with bed alarm set;with family/visitor present;with SCD's reapplied   PT Visit Diagnosis: Hemiplegia and hemiparesis;Apraxia (R48.2);Unsteadiness on feet (R26.81);Other symptoms and signs involving the nervous system (R29.898) Hemiplegia - Right/Left: Right Hemiplegia - dominant/non-dominant: Dominant Hemiplegia - caused by: Cerebral infarction    Time: 1035-1059 PT Time Calculation (min) (ACUTE ONLY): 24 min   Charges:   PT Evaluation $PT Eval High Complexity: 1 Procedure PT Treatments $Therapeutic Activity: 8-22 mins   PT G  Codes:        Carita Pian. Sanjuana Kava, [facility] Pager [place] 12/22/2016, 1:31 PM

## 2016-12-22 NOTE — Progress Notes (Signed)
  Speech Language Pathology Treatment: Dysphagia  Patient Details Name: Valerie Mcclure MRN: [id number] DOB: February 28, 1941 Today's Date: 12/22/2016 Time: 3532-9924 SLP Time Calculation (min) (ACUTE ONLY): 16 min  Assessment / Plan / Recommendation Clinical Impression  After oral care was completed, pt consumed thin liquids with Mod faded to Min cues for a second dry swallow with each bolus. Today she had no overt s/s of aspiration with intake, although per recent MBS - penetration was often silent. Therefore would not advance diet clinically, but would continue with Dys 2 diet and nectar thick liquids with additional trials of the water protocol and likely repeat MBS soon for reassessment of oropharyngeal function. Given coughing observed just on previous date, will try additional water trials on next date prior to proceeding with testing. Pt is very motivated to return to CIR and would be a great candidate to return there for her ongoing rehabilitative needs.   HPI HPI: 76yo CF with acute L MCA CVA with secondary hemorrhage transferred from rehab for acute encephalopathy likely toxic 2/2 narcotic accumulation given her response to narcan. CT head demonstrates stability - improvement and other metabolic indices/septic work up has been unrevealing. Pt receiving skilled ST in CIR for dysphagia and aphasia, had advanced to D2/nectar thick liquids prior to this admission. Recent MBS 12/08/16 findings mild-moderate oropharyngeal dysphagia with motor planning and sensorimotor deficits, recommended Dys 2/nectar with full supervision, meds whole in puree.      SLP Plan  Continue with current plan of care       Recommendations  Diet recommendations: Dysphagia 2 (fine chop);Nectar-thick liquid Liquids provided via: Cup;No straw Medication Administration: Whole meds with puree Supervision: Patient able to self feed;Full supervision/cueing for compensatory strategies Compensations: Slow rate;Small  sips/bites;Monitor for anterior loss;Multiple dry swallows after each bite/sip;Follow solids with liquid Postural Changes and/or Swallow Maneuvers: Seated upright 90 degrees;Upright 30-60 min after meal                Oral Care Recommendations: Oral care BID Follow up Recommendations: Inpatient Rehab SLP Visit Diagnosis: Dysphagia, oropharyngeal phase (R13.12) Plan: Continue with current plan of care       GO                Valerie Mcclure 12/22/2016, 11:41 AM  Valerie Mcclure, M.A. CCC-SLP [phone]

## 2016-12-23 ENCOUNTER — Inpatient Hospital Stay (HOSPITAL_COMMUNITY)
Admission: RE | Admit: 2016-12-23 | Discharge: 2017-01-07 | DRG: 091 | Disposition: A | Discharge status: Home Health | Payer: Medicare Other | Source: Intra-hospital | Attending: Physical Medicine & Rehabilitation | Admitting: Physical Medicine & Rehabilitation

## 2016-12-23 ENCOUNTER — Encounter (HOSPITAL_COMMUNITY): Payer: Self-pay

## 2016-12-23 DIAGNOSIS — I63512 Cerebral infarction due to unspecified occlusion or stenosis of left middle cerebral artery: Secondary | ICD-10-CM | POA: Diagnosis not present

## 2016-12-23 DIAGNOSIS — Z6825 Body mass index (BMI) 25.0-25.9, adult: Secondary | ICD-10-CM | POA: Diagnosis not present

## 2016-12-23 DIAGNOSIS — I69392 Facial weakness following cerebral infarction: Secondary | ICD-10-CM | POA: Diagnosis not present

## 2016-12-23 DIAGNOSIS — G9340 Encephalopathy, unspecified: Secondary | ICD-10-CM

## 2016-12-23 DIAGNOSIS — N390 Urinary tract infection, site not specified: Secondary | ICD-10-CM | POA: Diagnosis not present

## 2016-12-23 DIAGNOSIS — I639 Cerebral infarction, unspecified: Secondary | ICD-10-CM

## 2016-12-23 DIAGNOSIS — E8809 Other disorders of plasma-protein metabolism, not elsewhere classified: Secondary | ICD-10-CM

## 2016-12-23 DIAGNOSIS — I69390 Apraxia following cerebral infarction: Secondary | ICD-10-CM

## 2016-12-23 DIAGNOSIS — I69391 Dysphagia following cerebral infarction: Secondary | ICD-10-CM | POA: Diagnosis not present

## 2016-12-23 DIAGNOSIS — Z90710 Acquired absence of both cervix and uterus: Secondary | ICD-10-CM | POA: Diagnosis not present

## 2016-12-23 DIAGNOSIS — I10 Essential (primary) hypertension: Secondary | ICD-10-CM

## 2016-12-23 DIAGNOSIS — R2689 Other abnormalities of gait and mobility: Principal | ICD-10-CM | POA: Diagnosis present

## 2016-12-23 DIAGNOSIS — I69351 Hemiplegia and hemiparesis following cerebral infarction affecting right dominant side: Secondary | ICD-10-CM

## 2016-12-23 DIAGNOSIS — G92 Toxic encephalopathy: Secondary | ICD-10-CM | POA: Diagnosis not present

## 2016-12-23 DIAGNOSIS — D638 Anemia in other chronic diseases classified elsewhere: Secondary | ICD-10-CM

## 2016-12-23 DIAGNOSIS — R4701 Aphasia: Secondary | ICD-10-CM | POA: Diagnosis not present

## 2016-12-23 DIAGNOSIS — I69322 Dysarthria following cerebral infarction: Secondary | ICD-10-CM

## 2016-12-23 DIAGNOSIS — I130 Hypertensive heart and chronic kidney disease with heart failure and stage 1 through stage 4 chronic kidney disease, or unspecified chronic kidney disease: Secondary | ICD-10-CM | POA: Diagnosis present

## 2016-12-23 DIAGNOSIS — Z8249 Family history of ischemic heart disease and other diseases of the circulatory system: Secondary | ICD-10-CM

## 2016-12-23 DIAGNOSIS — I4892 Unspecified atrial flutter: Secondary | ICD-10-CM

## 2016-12-23 DIAGNOSIS — E441 Mild protein-calorie malnutrition: Secondary | ICD-10-CM | POA: Diagnosis not present

## 2016-12-23 DIAGNOSIS — Z888 Allergy status to other drugs, medicaments and biological substances status: Secondary | ICD-10-CM

## 2016-12-23 DIAGNOSIS — F419 Anxiety disorder, unspecified: Secondary | ICD-10-CM | POA: Diagnosis present

## 2016-12-23 DIAGNOSIS — I5033 Acute on chronic diastolic (congestive) heart failure: Secondary | ICD-10-CM

## 2016-12-23 DIAGNOSIS — D62 Acute posthemorrhagic anemia: Secondary | ICD-10-CM

## 2016-12-23 DIAGNOSIS — E876 Hypokalemia: Secondary | ICD-10-CM | POA: Diagnosis not present

## 2016-12-23 DIAGNOSIS — I4891 Unspecified atrial fibrillation: Secondary | ICD-10-CM | POA: Diagnosis present

## 2016-12-23 DIAGNOSIS — I6330 Cerebral infarction due to thrombosis of unspecified cerebral artery: Secondary | ICD-10-CM | POA: Diagnosis not present

## 2016-12-23 DIAGNOSIS — Z9049 Acquired absence of other specified parts of digestive tract: Secondary | ICD-10-CM | POA: Diagnosis not present

## 2016-12-23 DIAGNOSIS — R1310 Dysphagia, unspecified: Secondary | ICD-10-CM | POA: Diagnosis present

## 2016-12-23 DIAGNOSIS — Z91013 Allergy to seafood: Secondary | ICD-10-CM

## 2016-12-23 DIAGNOSIS — Z880 Allergy status to penicillin: Secondary | ICD-10-CM

## 2016-12-23 DIAGNOSIS — Z79899 Other long term (current) drug therapy: Secondary | ICD-10-CM

## 2016-12-23 DIAGNOSIS — E46 Unspecified protein-calorie malnutrition: Secondary | ICD-10-CM | POA: Diagnosis not present

## 2016-12-23 DIAGNOSIS — I69320 Aphasia following cerebral infarction: Secondary | ICD-10-CM | POA: Diagnosis not present

## 2016-12-23 DIAGNOSIS — R7303 Prediabetes: Secondary | ICD-10-CM | POA: Diagnosis present

## 2016-12-23 DIAGNOSIS — I5032 Chronic diastolic (congestive) heart failure: Secondary | ICD-10-CM | POA: Diagnosis present

## 2016-12-23 DIAGNOSIS — N183 Chronic kidney disease, stage 3 (moderate): Secondary | ICD-10-CM | POA: Diagnosis not present

## 2016-12-23 DIAGNOSIS — T40601S Poisoning by unspecified narcotics, accidental (unintentional), sequela: Secondary | ICD-10-CM

## 2016-12-23 DIAGNOSIS — N1832 Chronic kidney disease, stage 3b: Secondary | ICD-10-CM | POA: Diagnosis present

## 2016-12-23 DIAGNOSIS — R4702 Dysphasia: Secondary | ICD-10-CM | POA: Diagnosis not present

## 2016-12-23 DIAGNOSIS — N184 Chronic kidney disease, stage 4 (severe): Secondary | ICD-10-CM | POA: Diagnosis present

## 2016-12-23 DIAGNOSIS — T40605A Adverse effect of unspecified narcotics, initial encounter: Secondary | ICD-10-CM | POA: Diagnosis present

## 2016-12-23 DIAGNOSIS — B9620 Unspecified Escherichia coli [E. coli] as the cause of diseases classified elsewhere: Secondary | ICD-10-CM | POA: Diagnosis present

## 2016-12-23 DIAGNOSIS — R0682 Tachypnea, not elsewhere classified: Secondary | ICD-10-CM

## 2016-12-23 DIAGNOSIS — E785 Hyperlipidemia, unspecified: Secondary | ICD-10-CM | POA: Diagnosis not present

## 2016-12-23 DIAGNOSIS — Z91041 Radiographic dye allergy status: Secondary | ICD-10-CM

## 2016-12-23 DIAGNOSIS — E890 Postprocedural hypothyroidism: Secondary | ICD-10-CM | POA: Diagnosis not present

## 2016-12-23 DIAGNOSIS — R471 Dysarthria and anarthria: Secondary | ICD-10-CM

## 2016-12-23 MED ORDER — APIXABAN 5 MG PO TABS
5.0000 mg | ORAL_TABLET | Freq: Two times a day (BID) | ORAL | Status: DC
  Administered 2016-12-24 – 2017-01-07 (×29): 5 mg via ORAL
  Filled 2016-12-23 (×24): qty 1
  Filled 2016-12-23: qty 2
  Filled 2016-12-23 (×4): qty 1

## 2016-12-23 MED ORDER — METOPROLOL TARTRATE 25 MG PO TABS
25.0000 mg | ORAL_TABLET | Freq: Two times a day (BID) | ORAL | Status: DC
  Administered 2016-12-23: 25 mg via ORAL
  Filled 2016-12-23: qty 1

## 2016-12-23 MED ORDER — METOPROLOL TARTRATE 25 MG PO TABS
25.0000 mg | ORAL_TABLET | Freq: Two times a day (BID) | ORAL | Status: DC
  Administered 2016-12-23 – 2017-01-07 (×30): 25 mg via ORAL
  Filled 2016-12-23 (×30): qty 1

## 2016-12-23 MED ORDER — FLEET ENEMA 7-19 GM/118ML RE ENEM: 1.0000 | ENEMA | Freq: Once | RECTAL | Status: DC | PRN

## 2016-12-23 MED ORDER — PROCHLORPERAZINE 25 MG RE SUPP: 12.5000 mg | Freq: Four times a day (QID) | RECTAL | Status: DC | PRN

## 2016-12-23 MED ORDER — SIMVASTATIN 20 MG PO TABS
10.0000 mg | ORAL_TABLET | Freq: Every day | ORAL | Status: DC
  Administered 2016-12-23 – 2016-12-25 (×3): 10 mg via ORAL
  Filled 2016-12-23 (×3): qty 1

## 2016-12-23 MED ORDER — FLUOXETINE HCL 20 MG PO CAPS
20.0000 mg | ORAL_CAPSULE | Freq: Every day | ORAL | Status: DC
  Administered 2016-12-24 – 2017-01-07 (×15): 20 mg via ORAL
  Filled 2016-12-23 (×15): qty 1

## 2016-12-23 MED ORDER — NALOXONE HCL 0.4 MG/ML IJ SOLN: 0.4000 mg | INTRAMUSCULAR | Status: DC | PRN

## 2016-12-23 MED ORDER — BISACODYL 10 MG RE SUPP: 10.0000 mg | Freq: Every day | RECTAL | Status: DC | PRN

## 2016-12-23 MED ORDER — METOPROLOL TARTRATE 50 MG PO TABS: 50.0000 mg | ORAL_TABLET | Freq: Two times a day (BID) | ORAL | Status: DC

## 2016-12-23 MED ORDER — PROCHLORPERAZINE EDISYLATE 5 MG/ML IJ SOLN: 5.0000 mg | Freq: Four times a day (QID) | INTRAMUSCULAR | Status: DC | PRN

## 2016-12-23 MED ORDER — IRBESARTAN 75 MG PO TABS
150.0000 mg | ORAL_TABLET | Freq: Every day | ORAL | Status: DC
  Administered 2016-12-24: 150 mg via ORAL
  Filled 2016-12-23: qty 2

## 2016-12-23 MED ORDER — NORTRIPTYLINE HCL 10 MG PO CAPS
20.0000 mg | ORAL_CAPSULE | Freq: Every day | ORAL | Status: DC
  Administered 2016-12-23 – 2017-01-06 (×15): 20 mg via ORAL
  Filled 2016-12-23 (×15): qty 2

## 2016-12-23 MED ORDER — PROCHLORPERAZINE MALEATE 5 MG PO TABS: 5.0000 mg | ORAL_TABLET | Freq: Four times a day (QID) | ORAL | Status: DC | PRN

## 2016-12-23 MED ORDER — GUAIFENESIN-DM 100-10 MG/5ML PO SYRP: 5.0000 mL | ORAL_SOLUTION | Freq: Four times a day (QID) | ORAL | Status: DC | PRN

## 2016-12-23 MED ORDER — POLYETHYLENE GLYCOL 3350 17 G PO PACK: 17.0000 g | PACK | Freq: Every day | ORAL | Status: DC | PRN

## 2016-12-23 MED ORDER — METOPROLOL TARTRATE 25 MG PO TABS: 37.5000 mg | ORAL_TABLET | Freq: Two times a day (BID) | ORAL | Status: DC

## 2016-12-23 MED ORDER — TRAZODONE HCL 50 MG PO TABS: 25.0000 mg | ORAL_TABLET | Freq: Every evening | ORAL | Status: DC | PRN

## 2016-12-23 MED ORDER — RESOURCE THICKENUP CLEAR PO POWD
ORAL | Status: DC | PRN
  Filled 2016-12-23: qty 125

## 2016-12-23 MED ORDER — ACETAMINOPHEN 325 MG PO TABS: 325.0000 mg | ORAL_TABLET | ORAL | Status: DC | PRN

## 2016-12-23 MED ORDER — APIXABAN 5 MG PO TABS
5.0000 mg | ORAL_TABLET | Freq: Two times a day (BID) | ORAL | Status: DC
  Administered 2016-12-23: 5 mg via ORAL
  Filled 2016-12-23: qty 1

## 2016-12-23 MED ORDER — LEVOTHYROXINE SODIUM 88 MCG PO TABS
88.0000 ug | ORAL_TABLET | Freq: Every day | ORAL | Status: DC
  Administered 2016-12-24 – 2017-01-07 (×15): 88 ug via ORAL
  Filled 2016-12-23 (×15): qty 1

## 2016-12-23 MED ORDER — ORAL CARE MOUTH RINSE
15.0000 mL | Freq: Two times a day (BID) | OROMUCOSAL | Status: DC
  Administered 2016-12-23: 15 mL via OROMUCOSAL

## 2016-12-23 MED ORDER — APIXABAN 5 MG PO TABS: 5.0000 mg | ORAL_TABLET | Freq: Two times a day (BID) | ORAL | Status: DC

## 2016-12-23 MED ORDER — FUROSEMIDE 20 MG PO TABS
20.0000 mg | ORAL_TABLET | Freq: Every day | ORAL | Status: DC
  Administered 2016-12-24 – 2017-01-07 (×15): 20 mg via ORAL
  Filled 2016-12-23 (×15): qty 1

## 2016-12-23 MED ORDER — ALUM & MAG HYDROXIDE-SIMETH 200-200-20 MG/5ML PO SUSP: 30.0000 mL | ORAL | Status: DC | PRN

## 2016-12-23 MED ORDER — DIPHENHYDRAMINE HCL 12.5 MG/5ML PO ELIX: 12.5000 mg | ORAL_SOLUTION | Freq: Four times a day (QID) | ORAL | Status: DC | PRN

## 2016-12-23 MED ORDER — ORAL CARE MOUTH RINSE
15.0000 mL | Freq: Two times a day (BID) | OROMUCOSAL | Status: DC
  Administered 2016-12-24 – 2017-01-07 (×21): 15 mL via OROMUCOSAL

## 2016-12-23 NOTE — Plan of Care (Signed)
Chart and neuro images reviewed.   Pt admitted this time for opioids overdose. And resolved on Narcan drip. However, found to have Aflutter with variable AV block. Since pt had left MCA infarct s/p IR last month, the current Aflutter findings likely the cause of her earlier embolic stroke. She did have hemorrhagic transformation with left BG hematoma and left temporal hemorrhagic infarct. Supposed to be on ASA but seems never started. However, repeat CT head 6/2 and 6/3 showed near resolution of the hematoma. Therefore, I think it is reasonable to start with anticoagulation at this time from stroke standpoint. We recommend eliquis 5mg  bid with close neuro monitoring. If acute neuro changes, will need stat CT head to rule out ICH.   She will need to follow up with stroke clinic in 6 weeks after discharge. We will set up the appointment. Please call with questions.    Rosalin Hawking, MD PhD Stroke Neurology 12/23/2016 11:25 AM

## 2016-12-23 NOTE — Progress Notes (Signed)
Charlett Blake, MD Physician Signed Physical Medicine and Rehabilitation  Consult Note Date of Service: 12/01/2016 9:57 AM  Related encounter: ED to Hosp-Admission (Discharged) from 11/29/2016 in [place] All Collapse All   [] Hide copied text [] Hover for attribution information      Physical Medicine and Rehabilitation Consult  Reason for Consult: Right sided weakness, dysarthria and dysphagia Referring Physician: Dr. Nelda Marseille.   HPI: Valerie Mcclure is a 76 y.o. female with history of chronic LBP, CKD, hyperlipidemia who fell out of bed on 5/13 am and was found to have left facial weakness with slurred speech and right sided weakness. CT head done revealing hyperdense L-MCA sign with hypodensity in left temporal area. CTA showed left M1 occlusion due to acute thrombus or embolus with significant penumbra left temporoparietal lobe and irregular non-calcified plaque L-ICA. She underwent cerebral angio with complete revascularization of L-MCA with reperfusion. Follow up CCT showed hemorrhagic transformation of left temporal infarct, large area of hemorrhage and extravasated contrast in left basal ganglia,  minimal SDH along tentorium and 6 mm midline shift to right. MRI brain  L-MCA infarct with diffusion restriction left temporal lobe, superimposed 5.5 cm intra-axial hemorrhage centered at left basal ganglia with associated left hemisphere edema, effaced left lateral ventricle with trace IVH and occasional small foci of restriction in left thalamus and anterior left occipital lobe.    She was extubated 5/14 pm and therapy evaluations completed yesterday. Dr. Acie Fredrickson consulted for input on new onset A fib on telemetry. EKG without evidence of A fib and felt that patient might be having bouts of A fib due to stress v/s sinus arrhthymias. Question event monitor v/s loop to evaluate for arrhthymias.  BSS showed signs of dysphagia and concerns  for aspiration--NPO recommended. Speech evaluation revealed moderate dysarthria with expressive > receptive aphasia with phonemic paraphasias and anomia. CIR recommended for follow up therapy.    Review of Systems  Constitutional: Positive for malaise/fatigue.  HENT: Negative for hearing loss.   Eyes: Negative for blurred vision and double vision.  Respiratory: Negative for shortness of breath.   Cardiovascular: Negative for chest pain and palpitations.  Musculoskeletal: Positive for back pain and myalgias.  Neurological: Positive for speech change, focal weakness and weakness. Negative for dizziness and headaches.  Psychiatric/Behavioral: Positive for memory loss.          Past Medical History:  Diagnosis Date  . Blood transfusion without reported diagnosis   . Chronic low back pain   . Hyperlipidemia   . Osteopenia   . Unspecified essential hypertension   . Unspecified hypothyroidism          Past Surgical History:  Procedure Laterality Date  . ABDOMINAL HYSTERECTOMY  1970  . CHOLECYSTECTOMY  07/2009   Dr. Rise Patience  . COLONOSCOPY  2003  . FLEXIBLE SIGMOIDOSCOPY  2010  . HAND SURGERY    . IR ANGIO INTRA EXTRACRAN SEL COM CAROTID INNOMINATE UNI L MOD SED  11/29/2016  . IR ANGIO VERTEBRAL SEL SUBCLAVIAN INNOMINATE UNI R MOD SED  11/29/2016  . IR PERCUTANEOUS ART THROMBECTOMY/INFUSION INTRACRANIAL INC DIAG ANGIO  11/29/2016  . LUMBAR LAMINECTOMY  11/2008   Done by Dr. Patrice Paradise  . RADIOLOGY WITH ANESTHESIA N/A 11/29/2016   Procedure: RADIOLOGY WITH ANESTHESIA;  Surgeon: Radiologist, Medication, MD;  Location: [facility];  Service: Radiology;  Laterality: N/A;  . THYROIDECTOMY           Family History  Problem Relation Age of Onset  . Heart disease Father 74       MI age 40s  . Lung cancer Brother 53  . Colon cancer Neg Hx   . Esophageal cancer Neg Hx   . Rectal cancer Neg Hx   . Stomach cancer Neg Hx     Social History:  Married.  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 Her husband had heart attack last week. Daughter lives with them and assists as needed. She reports that she has never smoked. She has never used smokeless tobacco. She reports that she does not drink alcohol or use drugs.         Allergies  Allergen Reactions  . Shrimp [Shellfish Allergy]     Break outs and swelling   . Tandem Plus [Fefum-Fepo-Fa-B Cmp-C-Zn-Mn-Cu] Nausea And Vomiting  . Ivp Dye [Iodinated Diagnostic Agents] Rash    itching  . Penicillins Rash    itching          Medications Prior to Admission  Medication Sig Dispense Refill  . estradiol (ESTRACE) 0.5 MG tablet TAKE 1 TABLET(0.5 MG) BY MOUTH DAILY 90 tablet 2  . FLUoxetine (PROZAC) 20 MG capsule Take 1 capsule (20 mg total) by mouth daily. (Patient taking differently: Take 40 mg by mouth daily. ) 30 capsule 11  . furosemide (LASIX) 20 MG tablet TAKE 1 TABLET BY MOUTH DAILY AS NEEDED 90 tablet 1  . levothyroxine (SYNTHROID, LEVOTHROID) 75 MCG tablet Take 75 mcg by mouth daily before breakfast.    . metoprolol tartrate (LOPRESSOR) 25 MG tablet TAKE 1 TABLET(25 MG) BY MOUTH TWICE DAILY 180 tablet 2  . morphine (MS CONTIN) 15 MG 12 hr tablet Take 15 mg by mouth every 12 (twelve) hours.     Marland Kitchen NIFEdipine (PROCARDIA-XL/ADALAT-CC/NIFEDICAL-XL) 30 MG 24 hr tablet Take 30 mg by mouth daily.    . nortriptyline (PAMELOR) 10 MG capsule TAKE 2 CAPSULES BY MOUTH AT BEDTIME 180 capsule 1  . simvastatin (ZOCOR) 10 MG tablet TAKE 1 TABLET(10 MG) BY MOUTH DAILY 90 tablet 3  . valsartan (DIOVAN) 160 MG tablet Take 1 tablet (160 mg total) by mouth daily. 90 tablet 3  . vitamin B-12 (CYANOCOBALAMIN) 100 MCG tablet Take 100 mcg by mouth daily.    . Calcium Carbonate-Vit D-Min (CALCIUM 1200 PO) Take by mouth daily.      . Cholecalciferol (VITAMIN D3) 1000 UNITS CAPS Take by mouth daily.      Marland Kitchen levothyroxine (SYNTHROID, LEVOTHROID) 88 MCG tablet Take 1 tablet (88 mcg total) by mouth  daily. 90 tablet 1    Home: Home Living Family/patient expects to be discharged to:: Private residence Living Arrangements: Spouse/significant other, Children  Functional History: Functional Status:  Mobility:  ADL:  Cognition: Cognition Orientation Level: Oriented X4  Blood pressure (!) 139/54, pulse 80, temperature 98.1 F (36.7 C), temperature source Oral, resp. rate (!) 21, height 5\' 3"  (1.6 m), weight 79.5 kg (175 lb 4.3 oz), SpO2 100 %. Physical Exam  Nursing note and vitals reviewed. Constitutional: She appears well-developed and well-nourished. She is easily aroused. No distress. Nasal cannula in place.  Sleeping but easily aroused-- yawning frequently.   HENT:  Head: Normocephalic and atraumatic.  Eyes: Conjunctivae are normal. Pupils are equal, round, and reactive to light.  Neck: Normal range of motion. Neck supple.  Cardiovascular: Normal rate and regular rhythm.   Respiratory: Effort normal and breath sounds normal. No stridor. No respiratory distress. She has no wheezes.  GI: Soft. Bowel  sounds are normal. She exhibits no distension. There is no tenderness.  Musculoskeletal:  Edema right hand.   Neurological: She is easily aroused.  Sleepy but able to follow simple one step motor commands most of the time. Right facial weakness with dysarthria. Y/N biographic question were 50% accurate as daughter corrected frequently. RUE> RLE weakness.    Skin: Skin is warm and dry. She is not diaphoretic.  Psychiatric: She has a normal mood and affect. Her speech is slurred. She is slowed. Cognition and memory are impaired.  Fluent aphasia, Wernicke's, unable to repeat words Right  Motor strength is 3 minus at the finger flexors 3 minus at the biceps and triceps, 0 at the deltoid, 0 at the finger extensors, 3 plus in the right hip flexor, knee extensor, ankle dorsiflexor Left upper and lower extremity strength  Normal   Lab Results Last 24 Hours       Results for  orders placed or performed during the hospital encounter of 11/29/16 (from the past 24 hour(s))  T4, free     Status: Abnormal   Collection Time: 11/30/16  1:46 PM  Result Value Ref Range   Free T4 1.36 (H) 0.61 - 1.12 ng/dL  TSH     Status: None   Collection Time: 11/30/16  1:46 PM  Result Value Ref Range   TSH 1.076 0.350 - 4.500 uIU/mL  CBC     Status: Abnormal   Collection Time: 12/01/16  2:29 AM  Result Value Ref Range   WBC 17.3 (H) 4.0 - 10.5 K/uL   RBC 3.44 (L) 3.87 - 5.11 MIL/uL   Hemoglobin 10.2 (L) 12.0 - 15.0 g/dL   HCT 31.8 (L) 36.0 - 46.0 %   MCV 92.4 78.0 - 100.0 fL   MCH 29.7 26.0 - 34.0 pg   MCHC 32.1 30.0 - 36.0 g/dL   RDW 15.2 11.5 - 15.5 %   Platelets 219 150 - 400 K/uL  Basic metabolic panel     Status: Abnormal   Collection Time: 12/01/16  2:29 AM  Result Value Ref Range   Sodium 144 135 - 145 mmol/L   Potassium 3.4 (L) 3.5 - 5.1 mmol/L   Chloride 114 (H) 101 - 111 mmol/L   CO2 22 22 - 32 mmol/L   Glucose, Bld 126 (H) 65 - 99 mg/dL   BUN 17 6 - 20 mg/dL   Creatinine, Ser 1.10 (H) 0.44 - 1.00 mg/dL   Calcium 8.2 (L) 8.9 - 10.3 mg/dL   GFR calc non Af Amer 47 (L) >60 mL/min   GFR calc Af Amer 55 (L) >60 mL/min   Anion gap 8 5 - 15  Magnesium     Status: None   Collection Time: 12/01/16  2:29 AM  Result Value Ref Range   Magnesium 2.1 1.7 - 2.4 mg/dL  Phosphorus     Status: Abnormal   Collection Time: 12/01/16  2:29 AM  Result Value Ref Range   Phosphorus 1.9 (L) 2.5 - 4.6 mg/dL  Blood gas, arterial     Status: Abnormal   Collection Time: 12/01/16  3:51 AM  Result Value Ref Range   FIO2 0.36    Delivery systems NASAL CANNULA    pH, Arterial 7.437 7.350 - 7.450   pCO2 arterial 32.6 32.0 - 48.0 mmHg   pO2, Arterial 147 (H) 83.0 - 108.0 mmHg   Bicarbonate 21.6 20.0 - 28.0 mmol/L   Acid-base deficit 1.9 0.0 - 2.0 mmol/L   O2 Saturation 98.3 %  Patient temperature 98.6    Collection site RIGHT RADIAL      Drawn by [id number]    Sample type ARTERIAL    Allens test (pass/fail) PASS PASS   Mechanical Rate ARTERIAL       Imaging Results (Last 48 hours)  Ct Angio Head W Or Wo Contrast  Result Date: 11/29/2016 CLINICAL DATA:  Right facial droop and slurred speech. Last seen normal 10 p.m. last night. Stroke. EXAM: CT ANGIOGRAPHY HEAD AND NECK CT PERFUSION BRAIN TECHNIQUE: Multidetector CT imaging of the head and neck was performed using the standard protocol during bolus administration of intravenous contrast. Multiplanar CT image reconstructions and MIPs were obtained to evaluate the vascular anatomy. Carotid stenosis measurements (when applicable) are obtained utilizing NASCET criteria, using the distal internal carotid diameter as the denominator. Multiphase CT imaging of the brain was performed following IV bolus contrast injection. Subsequent parametric perfusion maps were calculated using RAPID software. CONTRAST:  80 mL Isovue 370 IV COMPARISON:  CT head 11/29/2016 FINDINGS: CTA NECK FINDINGS Aortic arch: Mild atherosclerotic disease in the aortic arch. Proximal great vessels widely patent. Right carotid system: Mild atherosclerotic disease at the right carotid bifurcation. No significant carotid stenosis. Left carotid system: Noncalcified plaque along the medial wall of the left internal carotid artery with mild irregularity but no definite ulceration. Less than 25% diameter stenosis of the left internal carotid artery. Left external carotid artery patent. Vertebral arteries: Both vertebral arteries patent to the basilar without significant stenosis. Skeleton: Moderate cervical spine degenerative change. No acute skeletal abnormality. Other neck: 10 mm rim calcified nodule in the right lobe of the thyroid, with a benign appearance. Smaller partially calcified nodule in the right upper lobe of the thyroid also likely benign. Upper chest: Mild mosaic pattern of lung density in the right lung apex.  Question mild edema. Review of the MIP images confirms the above findings CTA HEAD FINDINGS Anterior circulation: Mild atherosclerotic disease in the cavernous carotid bilaterally which is calcified but non stenotic. Left M1 segment occluded. This is hyperdense on CT and appears acute. There is perfusion of the left MCA branches due to collaterals. Posterior circulation: Both vertebral arteries patent to the basilar. PICA patent bilaterally. Basilar patent. Superior cerebellar and posterior cerebral arteries patent bilaterally without stenosis. Venous sinuses: Patent Anatomic variants: None Delayed phase: Not performed Review of the MIP images confirms the above findings CT Brain Perfusion Findings: CBF (<30%) Volume: 5 mLmL. However, review of the head CT of earlier today, there is hypodensity left temporal lobe compatible with acute infarct which is larger than indicated on CT perfusion. Perfusion (Tmax>6.0s) volume: 52 mLmL Perfusion (T-max greater than 4 seconds) volume:  99 mL Mismatch Volume: 47 mL.ML. Review of the perfusion images reveals significant penumbra greater than the calculated amount involving the left temporal parietal lobe. 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 Hypodensity left lower temporal lobe compatible with acute infarct. There is significant penumbra in the left temporoparietal lobe. Irregular noncalcified plaque left internal carotid artery could be a source of emboli or not the high non stenotic atherosclerotic disease. No significant stenosis in the carotid or vertebral arteries in the neck. Atherosclerotic calcification in the cavernous carotid bilaterally. These results were called by telephone at the time of interpretation on 11/29/2016 at 10:50 Am to Dr. Rogue Jury , who verbally acknowledged these results. Electronically Signed   By: Franchot Gallo M.D.   On: 11/29/2016 11:11   Ct Head  Wo  Contrast  Result Date: 11/29/2016 CLINICAL DATA:  Stroke post thrombectomy EXAM: CT HEAD WITHOUT CONTRAST TECHNIQUE: Contiguous axial images were obtained from the base of the skull through the vertex without intravenous contrast. COMPARISON:  CT head 11/29/2016 FINDINGS: Brain: High-density within the left putamen and left caudate compatible with acute hemorrhage. There is also high density in the superior basal ganglia compatible with extravasation of contrast. The high density area is most likely a mixture of blood and contrast. This area measures approximately 6.1 x 3.5 x 3.7 cm, acute blood volume 39 mL. Hemorrhagic transformation of left temporal lobe infarct. Compression of the left lateral ventricle due to mass-effect. 6 mm midline shift to the right. Interval development of subdural hemorrhage along the tentorium bilaterally. Vascular: Normal arterial and venous enhancement post angiography. Skull: Negative for fracture. Sinuses/Orbits: Mucosal edema in the paranasal sinuses. Normal orbit. Other: None IMPRESSION: Large area of hemorrhage and extravasated contrast in the left basal ganglia, blood volume 39 mL. Hemorrhagic transformation of left temporal infarct. Mild amount of subdural hematoma along the tentorium bilaterally. 6 mm midline shift to the right. These results were called by telephone at the time of interpretation on 11/29/2016 at 2:20 pm to Dr. Luanne Bras , who verbally acknowledged these results. Electronically Signed   By: Franchot Gallo M.D.   On: 11/29/2016 14:21   Ct Angio Neck W Or Wo Contrast  Result Date: 11/29/2016 CLINICAL DATA:  Right facial droop and slurred speech. Last seen normal 10 p.m. last night. Stroke. EXAM: CT ANGIOGRAPHY HEAD AND NECK CT PERFUSION BRAIN TECHNIQUE: Multidetector CT imaging of the head and neck was performed using the standard protocol during bolus administration of intravenous contrast. Multiplanar CT image reconstructions and MIPs were  obtained to evaluate the vascular anatomy. Carotid stenosis measurements (when applicable) are obtained utilizing NASCET criteria, using the distal internal carotid diameter as the denominator. Multiphase CT imaging of the brain was performed following IV bolus contrast injection. Subsequent parametric perfusion maps were calculated using RAPID software. CONTRAST:  80 mL Isovue 370 IV COMPARISON:  CT head 11/29/2016 FINDINGS: CTA NECK FINDINGS Aortic arch: Mild atherosclerotic disease in the aortic arch. Proximal great vessels widely patent. Right carotid system: Mild atherosclerotic disease at the right carotid bifurcation. No significant carotid stenosis. Left carotid system: Noncalcified plaque along the medial wall of the left internal carotid artery with mild irregularity but no definite ulceration. Less than 25% diameter stenosis of the left internal carotid artery. Left external carotid artery patent. Vertebral arteries: Both vertebral arteries patent to the basilar without significant stenosis. Skeleton: Moderate cervical spine degenerative change. No acute skeletal abnormality. Other neck: 10 mm rim calcified nodule in the right lobe of the thyroid, with a benign appearance. Smaller partially calcified nodule in the right upper lobe of the thyroid also likely benign. Upper chest: Mild mosaic pattern of lung density in the right lung apex. Question mild edema. Review of the MIP images confirms the above findings CTA HEAD FINDINGS Anterior circulation: Mild atherosclerotic disease in the cavernous carotid bilaterally which is calcified but non stenotic. Left M1 segment occluded. This is hyperdense on CT and appears acute. There is perfusion of the left MCA branches due to collaterals. Posterior circulation: Both vertebral arteries patent to the basilar. PICA patent bilaterally. Basilar patent. Superior cerebellar and posterior cerebral arteries patent bilaterally without stenosis. Venous sinuses: Patent  Anatomic variants: None Delayed phase: Not performed Review of the MIP images confirms the above findings CT Brain Perfusion Findings:  CBF (<30%) Volume: 5 mLmL. However, review of the head CT of earlier today, there is hypodensity left temporal lobe compatible with acute infarct which is larger than indicated on CT perfusion. Perfusion (Tmax>6.0s) volume: 52 mLmL Perfusion (T-max greater than 4 seconds) volume:  99 mL Mismatch Volume: 47 mL.ML. Review of the perfusion images reveals significant penumbra greater than the calculated amount involving the left temporal parietal lobe. 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 Hypodensity left lower temporal lobe compatible with acute infarct. There is significant penumbra in the left temporoparietal lobe. Irregular noncalcified plaque left internal carotid artery could be a source of emboli or not the high non stenotic atherosclerotic disease. No significant stenosis in the carotid or vertebral arteries in the neck. Atherosclerotic calcification in the cavernous carotid bilaterally. These results were called by telephone at the time of interpretation on 11/29/2016 at 10:50 Am to Dr. Rogue Jury , who verbally acknowledged these results. Electronically Signed   By: Franchot Gallo M.D.   On: 11/29/2016 11:11   Mr Brain Wo Contrast  Result Date: 11/30/2016 CLINICAL DATA:  76 year old female who awoke with aphasia and right facial droop. Left M1 occlusion status post neuro endovascular revascularization. Subsequent large left hemisphere hemorrhage. EXAM: MRI HEAD WITHOUT CONTRAST TECHNIQUE: Multiplanar, multiecho pulse sequences of the brain and surrounding structures were obtained without intravenous contrast. COMPARISON:  Post endovascular Head CT 11/29/2016, and earlier. FINDINGS: Brain: Cortical and subcortical white matter restricted diffusion along the anterior left temporal tip and tracking  posteriorly along the left superior and middle temporal gyri (Series 5, image 19). Superimposed intracranial hemorrhage centered at the left basal ganglia with T2 and T1 heterogeneous blood products encompassing 55 x 30 x 31 mm (AP by transverse by CC) for an estimated blood volume of 26 mL. Associated diffusion susceptibility in the area of the hemorrhage, but there also seems to be some marginal restricted diffusion. Small volume of intraventricular hemorrhage layering in the occipital horns. Effaced left lateral ventricle. No ventriculomegaly. Edema surrounding the area of hemorrhage, and cytotoxic edema in the left temporal lobe. Rightward midline shift of 7 mm. Patent basilar cisterns. There are occasional small foci of restricted diffusion in the left PCA territory including the dorsal left thalamus and anterior left occipital lobe. No posterior fossa or contralateral right hemisphere diffusion restriction. Patchy nonspecific T2 hyperintensity in the pons. No cortical encephalomalacia. No chronic cerebral blood products. Negative pituitary and cervicomedullary junction. Vascular: Major intracranial vascular flow voids are preserved. Skull and upper cervical spine: Negative. Sinuses/Orbits: Normal orbits soft tissues. Visualized paranasal sinuses and mastoids are stable and well pneumatized. Other: Layering fluid in the pharynx.  Negative scalp soft tissues. IMPRESSION: 1. Left MCA infarct with the most confluent diffusion restriction in the left temporal lobe. Superimposed 5.5 cm intra-axial hemorrhage centered at the left basal ganglia with intra-axial blood volume estimated 26 mL. Associated left hemisphere edema. 2. Rightward midline shift of 7 mm.  Basilar cisterns remain patent. 3. Effaced left lateral ventricle and trace intraventricular hemorrhage without ventriculomegaly. 4. Occasional small foci of restricted diffusion also at the left PCA territory. Electronically Signed   By: Genevie Ann M.D.   On:  11/30/2016 13:59   Ct Cerebral Perfusion W Contrast  Result Date: 11/29/2016 CLINICAL DATA:  Right facial droop and slurred speech. Last seen normal 10 p.m. last night. Stroke. EXAM: CT ANGIOGRAPHY HEAD AND NECK CT PERFUSION BRAIN TECHNIQUE: Multidetector CT imaging of the head and neck  was performed using the standard protocol during bolus administration of intravenous contrast. Multiplanar CT image reconstructions and MIPs were obtained to evaluate the vascular anatomy. Carotid stenosis measurements (when applicable) are obtained utilizing NASCET criteria, using the distal internal carotid diameter as the denominator. Multiphase CT imaging of the brain was performed following IV bolus contrast injection. Subsequent parametric perfusion maps were calculated using RAPID software. CONTRAST:  80 mL Isovue 370 IV COMPARISON:  CT head 11/29/2016 FINDINGS: CTA NECK FINDINGS Aortic arch: Mild atherosclerotic disease in the aortic arch. Proximal great vessels widely patent. Right carotid system: Mild atherosclerotic disease at the right carotid bifurcation. No significant carotid stenosis. Left carotid system: Noncalcified plaque along the medial wall of the left internal carotid artery with mild irregularity but no definite ulceration. Less than 25% diameter stenosis of the left internal carotid artery. Left external carotid artery patent. Vertebral arteries: Both vertebral arteries patent to the basilar without significant stenosis. Skeleton: Moderate cervical spine degenerative change. No acute skeletal abnormality. Other neck: 10 mm rim calcified nodule in the right lobe of the thyroid, with a benign appearance. Smaller partially calcified nodule in the right upper lobe of the thyroid also likely benign. Upper chest: Mild mosaic pattern of lung density in the right lung apex. Question mild edema. Review of the MIP images confirms the above findings CTA HEAD FINDINGS Anterior circulation: Mild atherosclerotic  disease in the cavernous carotid bilaterally which is calcified but non stenotic. Left M1 segment occluded. This is hyperdense on CT and appears acute. There is perfusion of the left MCA branches due to collaterals. Posterior circulation: Both vertebral arteries patent to the basilar. PICA patent bilaterally. Basilar patent. Superior cerebellar and posterior cerebral arteries patent bilaterally without stenosis. Venous sinuses: Patent Anatomic variants: None Delayed phase: Not performed Review of the MIP images confirms the above findings CT Brain Perfusion Findings: CBF (<30%) Volume: 5 mLmL. However, review of the head CT of earlier today, there is hypodensity left temporal lobe compatible with acute infarct which is larger than indicated on CT perfusion. Perfusion (Tmax>6.0s) volume: 52 mLmL Perfusion (T-max greater than 4 seconds) volume:  99 mL Mismatch Volume: 47 mL.ML. Review of the perfusion images reveals significant penumbra greater than the calculated amount involving the left temporal parietal lobe. 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 Hypodensity left lower temporal lobe compatible with acute infarct. There is significant penumbra in the left temporoparietal lobe. Irregular noncalcified plaque left internal carotid artery could be a source of emboli or not the high non stenotic atherosclerotic disease. No significant stenosis in the carotid or vertebral arteries in the neck. Atherosclerotic calcification in the cavernous carotid bilaterally. These results were called by telephone at the time of interpretation on 11/29/2016 at 10:50 Am to Dr. Rogue Jury , who verbally acknowledged these results. Electronically Signed   By: Franchot Gallo M.D.   On: 11/29/2016 11:11   Dg Chest Port 1 View  Result Date: 12/01/2016 CLINICAL DATA:  Stroke, history of hypertension, extubation of the trachea and esophagus. EXAM: PORTABLE CHEST  1 VIEW COMPARISON:  Portable chest x-ray of Nov 29, 2016. FINDINGS: The lungs are adequately inflated. There is no focal infiltrate. The retrocardiac lung markings on the left remain mildly prominent. There is no significant pleural effusion and there is no pneumothorax. The heart is top-normal in size. The pulmonary vascularity is normal. There is calcification in the wall of the thoracic aorta. IMPRESSION: Good inflation of both lungs since  extubation. Minimal subsegmental atelectasis at the left lung base is suspected. No CHF. Thoracic aortic atherosclerosis. Electronically Signed   By: David  Martinique M.D.   On: 12/01/2016 07:27   Dg Chest Port 1 View  Result Date: 11/30/2016 CLINICAL DATA:  Confirm orogastric tube placement. EXAM: PORTABLE CHEST 1 VIEW COMPARISON:  Chest radiograph earlier this day at 0907 hour FINDINGS: Endotracheal tube is 2.3 cm from the carina. Enteric tube in place, tip and side-port below the diaphragm. Low lung volumes. Borderline cardiomegaly. No pulmonary edema. No large pleural effusion. No pneumothorax. IMPRESSION: Endotracheal tube approximately 2.3 cm from the carina. Enteric tube in place. Borderline cardiomegaly.  No new abnormality. Electronically Signed   By: Jeb Levering M.D.   On: 11/30/2016 00:28   Dg Abd Portable 1v  Result Date: 11/30/2016 CLINICAL DATA:  76 y/o  F; enteric tube placement. EXAM: PORTABLE ABDOMEN - 1 VIEW COMPARISON:  None. FINDINGS: Nonobstructive bowel gas pattern. Enteric tube tip projects over the stomach. Advanced lumbar levocurvature and lower lumbar fusion changes. IMPRESSION: Enteric tube tip projects over the mid stomach. Electronically Signed   By: Kristine Garbe M.D.   On: 11/30/2016 00:26   Ir Percutaneous Art Thrombectomy/infusion Intracranial Inc Diag Angio  Result Date: 11/30/2016 INDICATION: Patient presenting with aphasia and right-sided weakness with left gaze preference. CT angiogram of the head and neck  revealing an occluded left middle cerebral artery M1 segment with a sizable penumbra on CT perfusion study. CBF < 30 % 5 ml,Tmax > 6 secs of 52 ml with a mismatch vol of 50ml. EXAM: 1. EMERGENT LARGE VESSEL OCCLUSION THROMBOLYSIS (anterior CIRCULATION) 2. COMPARISON:  CT angiogram of the head and neck, and CT perfusion study of 11/29/2016. MEDICATIONS: Vancomycin 1.5 mg IV was administered within 1 hour of the procedure. ANESTHESIA/SEDATION: General anesthesia. CONTRAST:  Isovue 300 approximately 75 cc. FLUOROSCOPY TIME:  Fluoroscopy Time: 28 minutes 36 seconds (1539 mGy). COMPLICATIONS: None immediate. TECHNIQUE: Following a full explanation of the procedure along with the potential associated complications, an informed witnessed consent was obtained. The risks of intracranial hemorrhage of 10%, worsening neurological deficit, ventilator dependency, death and inability to revascularize were all reviewed in detail with the patient's . The patient was then put under general anesthesia by the Department of Anesthesiology at [facility]. The right groin was prepped and draped in the usual sterile fashion. Thereafter using modified Seldinger technique, transfemoral access into the right common femoral artery was obtained without difficulty. Over a 0.035 inch guidewire a 5 French Pinnacle sheath was inserted. Through this, and also over a 0.035 inch guidewire a 5 [place] JB 1 catheter was advanced to the aortic arch region and selectively positioned in the right common carotid artery and the left common carotid artery, and the innominate artery. FINDINGS: The right subclavian arteriogram demonstrates the right vertebral artery to opacify normally to the cranial skull base. Flow is noted into the right vertebrobasilar junction and the right posterior-inferior cerebellar artery. Flash opacification of the proximal basilar artery is also noted. The right common carotid arteriogram demonstrates the right external  carotid artery and its major branches to be widely patent. The right internal carotid artery at the bulb to the cranial skull base opacifies widely. The petrous, the cavernous and the supraclinoid segments are widely patent. The right middle cerebral artery and the right anterior cerebral artery opacify into the capillary and venous phases. Flash opacification via the anterior communicating artery of the left anterior cerebral artery A2 segment is also  noted. Also noted is contrast blush in the region of the middle terminate of the right maxillary sinus most consistent with inflammatory reaction. The left common carotid arteriogram demonstrates the left external carotid artery and its major branches to be widely patent. The left internal carotid artery at the bulb to the cranial skull base opacifies widely. There are mild FMD-like changes noted in the mid left internal carotid artery mid cervical segment. The petrous, the cavernous and the supraclinoid segments demonstrate wide patency. A left posterior communicating artery is seen opacifying the left posterior cerebral artery distribution. The left anterior cerebral artery opacifies into the capillary and venous phases with flash filling via the anterior communicating artery of the right anterior cerebral artery A2 and A1 segments. The left middle cerebral artery demonstrates complete angiographic occlusion in its mid M1 segment with delayed opacification of an anterior temporal branch with prominent filling defects proximally. The M1 segment, otherwise, remains completely occluded. The delayed arterial phase in the lateral projection demonstrates partial retrograde opacification of the distal perisylvian branches with large area of hypoperfusion involving the lentiform nucleus and the caudate head. PROCEDURE: ENDOVASCULAR COMPLETE REVASCULARIZATION OF OCCLUDED LEFT MIDDLE CEREBRAL ARTERY M1 SEGMENT The diagnostic JB 1 catheter in the left common carotid artery was  then exchanged over a 0.035 inch 300 cm Rosen exchange guidewire for an 8 French 55 cm Brite tip neurovascular sheath using biplane roadmap technique and constant fluoroscopic guidance. Good aspiration was obtained from the side port of the neurovascular sheath. This was then connected to continuous heparinized saline infusion. Over the [employer] guidewire, an 8 [place] 85 cm FlowGate balloon guide catheter which had been prepped with 50% contrast and 50% heparinized saline infusion was advanced and positioned in the distal left common carotid artery. The guidewire was removed. Good aspiration was obtained from the hub of the 8 [place] FlowGate guide catheter. A gentle contrast injection demonstrated no evidence of spasms, dissections or of intraluminal filling defects. At this time, using biplane roadmap technique and constant fluoroscopic guidance, over a 0.035 inch Roadrunner guidewire, the 8 [place] FlowGate guide catheter was then advanced to the distal cervical left ICA without difficulty. Good aspiration was obtained after removal of the wire. A gentle control arteriogram demonstrated mild spasm with no impediment of distal flow. Patient was treated with 1 aliquots of 25 mics of nitroglycerin with relief of the vasospasm. At this time, in a coaxial manner and with constant heparinized saline infusion using biplane roadmap technique and constant fluoroscopic guidance, a Trevo ProVue 021 microcatheter was advanced over a 0.014 inch Softip Synchro micro guidewire to the distal end of the FlowGate guide catheter. With the micro guidewire leading with a J-tip configuration the combination was navigated to the supraclinoid right ICA. A torque device was then used to manipulate the guidewire into the left middle cerebral artery followed by the microcatheter. The micro guidewire was then advanced without difficulty into the M2 M3 region of the inferior division of left middle cerebral artery followed by the  microcatheter. The guidewire was removed. Good aspiration was obtained from the hub of the microcatheter. A gentle contrast injection demonstrated antegrade flow of contrast. At this time, a 4 mm x 40 mm Solitaire FR retrieval device was advanced in a coaxial manner and with constant heparinized saline infusion using biplane roadmap technique and constant fluoroscopic guidance to the distal portion of the microcatheter. At this time the entire system was straightened by loosening the O ring on the delivery microcatheter. After  having ascertained the distal and the proximal positioning of the retrieval device, with slight forward gentle traction with the right hand on the delivery micro guidewire, with the left hand the microcatheter was retrieved unsheathing the entire Solitaire FR retrieval device with the tip of the microcatheter just proximal to the proximal landing zone of the retrieval device. A control arteriogram performed through the 8 [place] FlowGate guide catheter demonstrated significantly improved caliber and flow through the middle cerebral artery distribution with a narrowing of the retrieval device in the distal M1 segment. At this time, the balloon of the [facility] guide catheter was inflated in the distal left internal carotid artery for proximal flow arrest. The proximal portion of the retrieval device was captured into the microcatheter. There on after whilst aspirating with a 60 mL syringe at the hub of the [facility] guide catheter, the combination of the retrieval device and the microcatheter were gently retrieved and removed. The aspirate demonstrated prominent clot measuring approximately 2 mm x 4 mm. Aspiration was continued as the balloon was deflated in the left internal carotid artery. The FlowGate guide catheter was gently retrieved more proximally after deflation of the balloon. A control arteriogram performed through the 8 [place] FlowGate guide catheter demonstrated complete angiographic  revascularization of the left occluded left middle cerebral artery with patency of the left posterior cerebral and left anterior cerebral arteries. Focal segmental spasm was noted of the left middle cerebral artery involving the inferior division in the distal M1 region. This responded to 4 aliquots of 25 mics of nitroglycerin intra-arterially. A final control arteriogram performed demonstrated significant improved caliber of the previously noted vasospasm of the left middle cerebral artery, and also the left internal carotid artery mid cervical segment. Also noted of the final to control arteriograms a focal area of hyper density in the left centrum semiovale region. No evidence of angiographic extravasation or mass effect was seen. Patient's hemodynamic status, blood pressure and heart rate remained stable throughout the procedure. The 8 [place] FlowGate guide catheter and the 8 [place] neurovascular sheath were then retrieved into the abdominal aorta and exchanged over a J-tip guidewire for an 8 French Pinnacle sheath which in turn was successfully removed with the application of a closure device. The right groin showed no evidence of a hematoma or hemorrhage. The distal pulses in both feet remained Dopplerable unchanged from prior to the procedure. IMPRESSION: Status post endovascular complete revascularization of occluded left middle cerebral artery with 1 pass with the Solitaire FR 4 mm x 40 mm retrieval device, with achievement of a TICI 3 reperfusion. Groin puncture to initial reperfusion TICI2b 32 minutes. Groin puncture to TICI reperfusion 38 minutes PLAN: The patient was transferred to the CT scanner for postprocedural CT scan of the brain. Electronically Signed   By: Luanne Bras M.D.   On: 11/29/2016 14:42   Ir Angio Intra Extracran Sel Com Carotid Innominate Uni L Mod Sed  Result Date: 11/30/2016 INDICATION: Patient presenting with aphasia and right-sided weakness with left gaze preference.  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 CBF < 30 % 5 ml,Tmax > 6 secs of 52 ml with a mismatch vol of 80ml. EXAM: 1. EMERGENT LARGE VESSEL OCCLUSION THROMBOLYSIS (anterior CIRCULATION) 2. COMPARISON:  CT angiogram of the head and neck, and CT perfusion study of 11/29/2016. MEDICATIONS: Vancomycin 1.5 mg IV was administered within 1 hour of the procedure. ANESTHESIA/SEDATION: General  anesthesia. CONTRAST:  Isovue 300 approximately 75 cc. FLUOROSCOPY TIME:  Fluoroscopy Time: 28 minutes 36 seconds (1539 mGy). COMPLICATIONS: None immediate. TECHNIQUE: Following a full explanation of the procedure along with the potential associated complications, an informed witnessed consent was obtained. The risks of intracranial hemorrhage of 10%, worsening neurological deficit, ventilator dependency, death and inability to revascularize were all reviewed in detail with the patient's . The patient was then put under general anesthesia by the Department of Anesthesiology at [facility]. The right groin was prepped and draped in the usual sterile fashion. Thereafter using modified Seldinger technique, transfemoral access into the right common femoral artery was obtained without difficulty. Over a 0.035 inch guidewire a 5 French Pinnacle sheath was inserted. Through this, and also over a 0.035 inch guidewire a 5 [place] JB 1 catheter was advanced to the aortic arch region and selectively positioned in the right common carotid artery and the left common carotid artery, and the innominate artery. FINDINGS: The right subclavian arteriogram demonstrates the right vertebral artery to opacify normally to the cranial skull base. Flow is noted into the right vertebrobasilar junction and the right posterior-inferior cerebellar artery. Flash opacification of the proximal basilar artery is also noted. The right common carotid arteriogram  demonstrates the right external carotid artery and its major branches to be widely patent. The right internal carotid artery at the bulb to the cranial skull base opacifies widely. The petrous, the cavernous and the supraclinoid segments are widely patent. The right middle cerebral artery and the right anterior cerebral artery opacify into the capillary and venous phases. Flash opacification via the anterior communicating artery of the left anterior cerebral artery A2 segment is also noted. Also noted is contrast blush in the region of the middle terminate of the right maxillary sinus most consistent with inflammatory reaction. The left common carotid arteriogram demonstrates the left external carotid artery and its major branches to be widely patent. The left internal carotid artery at the bulb to the cranial skull base opacifies widely. There are mild FMD-like changes noted in the mid left internal carotid artery mid cervical segment. The petrous, the cavernous and the supraclinoid segments demonstrate wide patency. A left posterior communicating artery is seen opacifying the left posterior cerebral artery distribution. The left anterior cerebral artery opacifies into the capillary and venous phases with flash filling via the anterior communicating artery of the right anterior cerebral artery A2 and A1 segments. The left middle cerebral artery demonstrates complete angiographic occlusion in its mid M1 segment with delayed opacification of an anterior temporal branch with prominent filling defects proximally. The M1 segment, otherwise, remains completely occluded. The delayed arterial phase in the lateral projection demonstrates partial retrograde opacification of the distal perisylvian branches with large area of hypoperfusion involving the lentiform nucleus and the caudate head. PROCEDURE: ENDOVASCULAR COMPLETE REVASCULARIZATION OF OCCLUDED LEFT MIDDLE CEREBRAL ARTERY M1 SEGMENT The diagnostic JB 1 catheter in  the left common carotid artery was then exchanged over a 0.035 inch 300 cm Rosen exchange guidewire for an 8 [place] 55 cm Brite tip neurovascular sheath using biplane roadmap technique and constant fluoroscopic guidance. Good aspiration was obtained from the side port of the neurovascular sheath. This was then connected to continuous heparinized saline infusion. Over the [employer] guidewire, an 8 [place] 85 cm FlowGate balloon guide catheter which had been prepped with 50% contrast and 50% heparinized saline infusion was advanced and positioned in the distal left common carotid artery. The guidewire was removed. Good aspiration  was obtained from the hub of the 8 [place] FlowGate guide catheter. A gentle contrast injection demonstrated no evidence of spasms, dissections or of intraluminal filling defects. At this time, using biplane roadmap technique and constant fluoroscopic guidance, over a 0.035 inch Roadrunner guidewire, the 8 [place] FlowGate guide catheter was then advanced to the distal cervical left ICA without difficulty. Good aspiration was obtained after removal of the wire. A gentle control arteriogram demonstrated mild spasm with no impediment of distal flow. Patient was treated with 1 aliquots of 25 mics of nitroglycerin with relief of the vasospasm. At this time, in a coaxial manner and with constant heparinized saline infusion using biplane roadmap technique and constant fluoroscopic guidance, a Trevo ProVue 021 microcatheter was advanced over a 0.014 inch Softip Synchro micro guidewire to the distal end of the FlowGate guide catheter. With the micro guidewire leading with a J-tip configuration the combination was navigated to the supraclinoid right ICA. A torque device was then used to manipulate the guidewire into the left middle cerebral artery followed by the microcatheter. The micro guidewire was then advanced without difficulty into the M2 M3 region of the inferior division of left middle  cerebral artery followed by the microcatheter. The guidewire was removed. Good aspiration was obtained from the hub of the microcatheter. A gentle contrast injection demonstrated antegrade flow of contrast. At this time, a 4 mm x 40 mm Solitaire FR retrieval device was advanced in a coaxial manner and with constant heparinized saline infusion using biplane roadmap technique and constant fluoroscopic guidance to the distal portion of the microcatheter. At this time the entire system was straightened by loosening the O ring on the delivery microcatheter. After having ascertained the distal and the proximal positioning of the retrieval device, with slight forward gentle traction with the right hand on the delivery micro guidewire, with the left hand the microcatheter was retrieved unsheathing the entire Solitaire FR retrieval device with the tip of the microcatheter just proximal to the proximal landing zone of the retrieval device. A control arteriogram performed through the 8 [place] FlowGate guide catheter demonstrated significantly improved caliber and flow through the middle cerebral artery distribution with a narrowing of the retrieval device in the distal M1 segment. At this time, the balloon of the [facility] guide catheter was inflated in the distal left internal carotid artery for proximal flow arrest. The proximal portion of the retrieval device was captured into the microcatheter. There on after whilst aspirating with a 60 mL syringe at the hub of the [facility] guide catheter, the combination of the retrieval device and the microcatheter were gently retrieved and removed. The aspirate demonstrated prominent clot measuring approximately 2 mm x 4 mm. Aspiration was continued as the balloon was deflated in the left internal carotid artery. The FlowGate guide catheter was gently retrieved more proximally after deflation of the balloon. A control arteriogram performed through the 8 [place] FlowGate guide catheter  demonstrated complete angiographic revascularization of the left occluded left middle cerebral artery with patency of the left posterior cerebral and left anterior cerebral arteries. Focal segmental spasm was noted of the left middle cerebral artery involving the inferior division in the distal M1 region. This responded to 4 aliquots of 25 mics of nitroglycerin intra-arterially. A final control arteriogram performed demonstrated significant improved caliber of the previously noted vasospasm of the left middle cerebral artery, and also the left internal carotid artery mid cervical segment. Also noted of the final to control arteriograms a focal area of hyper  density in the left centrum semiovale region. No evidence of angiographic extravasation or mass effect was seen. Patient's hemodynamic status, blood pressure and heart rate remained stable throughout the procedure. The 8 [place] FlowGate guide catheter and the 8 [place] neurovascular sheath were then retrieved into the abdominal aorta and exchanged over a J-tip guidewire for an 8 French Pinnacle sheath which in turn was successfully removed with the application of a closure device. The right groin showed no evidence of a hematoma or hemorrhage. The distal pulses in both feet remained Dopplerable unchanged from prior to the procedure. IMPRESSION: Status post endovascular complete revascularization of occluded left middle cerebral artery with 1 pass with the Solitaire FR 4 mm x 40 mm retrieval device, with achievement of a TICI 3 reperfusion. Groin puncture to initial reperfusion TICI2b 32 minutes. Groin puncture to TICI reperfusion 38 minutes PLAN: The patient was transferred to the CT scanner for postprocedural CT scan of the brain. Electronically Signed   By: Luanne Bras M.D.   On: 11/29/2016 14:42   Ir Angio Vertebral Sel Subclavian Innominate Uni R Mod Sed  Result Date: 11/30/2016 INDICATION: Patient presenting with aphasia and right-sided weakness  with left gaze preference. 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 CBF < 30 % 5 ml,Tmax > 6 secs of 52 ml with a mismatch vol of 10ml. EXAM: 1. EMERGENT LARGE VESSEL OCCLUSION THROMBOLYSIS (anterior CIRCULATION) 2. COMPARISON:  CT angiogram of the head and neck, and CT perfusion study of 11/29/2016. MEDICATIONS: Vancomycin 1.5 mg IV was administered within 1 hour of the procedure. ANESTHESIA/SEDATION: General anesthesia. CONTRAST:  Isovue 300 approximately 75 cc. FLUOROSCOPY TIME:  Fluoroscopy Time: 28 minutes 36 seconds (1539 mGy). COMPLICATIONS: None immediate. TECHNIQUE: Following a full explanation of the procedure along with the potential associated complications, an informed witnessed consent was obtained. The risks of intracranial hemorrhage of 10%, worsening neurological deficit, ventilator dependency, death and inability to revascularize were all reviewed in detail with the patient's . The patient was then put under general anesthesia by the Department of Anesthesiology at [facility]. The right groin was prepped and draped in the usual sterile fashion. Thereafter using modified Seldinger technique, transfemoral access into the right common femoral artery was obtained without difficulty. Over a 0.035 inch guidewire a 5 French Pinnacle sheath was inserted. Through this, and also over a 0.035 inch guidewire a 5 [place] JB 1 catheter was advanced to the aortic arch region and selectively positioned in the right common carotid artery and the left common carotid artery, and the innominate artery. FINDINGS: The right subclavian arteriogram demonstrates the right vertebral artery to opacify normally to the cranial skull base. Flow is noted into the right vertebrobasilar junction and the right posterior-inferior cerebellar artery. Flash opacification of the proximal basilar artery is also noted. The right  common carotid arteriogram demonstrates the right external carotid artery and its major branches to be widely patent. The right internal carotid artery at the bulb to the cranial skull base opacifies widely. The petrous, the cavernous and the supraclinoid segments are widely patent. The right middle cerebral artery and the right anterior cerebral artery opacify into the capillary and venous phases. Flash opacification via the anterior communicating artery of the left anterior cerebral artery A2 segment is also noted. Also noted is contrast blush in the region of the middle terminate of the right maxillary sinus most consistent with inflammatory  reaction. The left common carotid arteriogram demonstrates the left external carotid artery and its major branches to be widely patent. The left internal carotid artery at the bulb to the cranial skull base opacifies widely. There are mild FMD-like changes noted in the mid left internal carotid artery mid cervical segment. The petrous, the cavernous and the supraclinoid segments demonstrate wide patency. A left posterior communicating artery is seen opacifying the left posterior cerebral artery distribution. The left anterior cerebral artery opacifies into the capillary and venous phases with flash filling via the anterior communicating artery of the right anterior cerebral artery A2 and A1 segments. The left middle cerebral artery demonstrates complete angiographic occlusion in its mid M1 segment with delayed opacification of an anterior temporal branch with prominent filling defects proximally. The M1 segment, otherwise, remains completely occluded. The delayed arterial phase in the lateral projection demonstrates partial retrograde opacification of the distal perisylvian branches with large area of hypoperfusion involving the lentiform nucleus and the caudate head. PROCEDURE: ENDOVASCULAR COMPLETE REVASCULARIZATION OF OCCLUDED LEFT MIDDLE CEREBRAL ARTERY M1 SEGMENT The  diagnostic JB 1 catheter in the left common carotid artery was then exchanged over a 0.035 inch 300 cm Rosen exchange guidewire for an 8 French 55 cm Brite tip neurovascular sheath using biplane roadmap technique and constant fluoroscopic guidance. Good aspiration was obtained from the side port of the neurovascular sheath. This was then connected to continuous heparinized saline infusion. Over the [employer] guidewire, an 8 [place] 85 cm FlowGate balloon guide catheter which had been prepped with 50% contrast and 50% heparinized saline infusion was advanced and positioned in the distal left common carotid artery. The guidewire was removed. Good aspiration was obtained from the hub of the 8 [place] FlowGate guide catheter. A gentle contrast injection demonstrated no evidence of spasms, dissections or of intraluminal filling defects. At this time, using biplane roadmap technique and constant fluoroscopic guidance, over a 0.035 inch Roadrunner guidewire, the 8 [place] FlowGate guide catheter was then advanced to the distal cervical left ICA without difficulty. Good aspiration was obtained after removal of the wire. A gentle control arteriogram demonstrated mild spasm with no impediment of distal flow. Patient was treated with 1 aliquots of 25 mics of nitroglycerin with relief of the vasospasm. At this time, in a coaxial manner and with constant heparinized saline infusion using biplane roadmap technique and constant fluoroscopic guidance, a Trevo ProVue 021 microcatheter was advanced over a 0.014 inch Softip Synchro micro guidewire to the distal end of the FlowGate guide catheter. With the micro guidewire leading with a J-tip configuration the combination was navigated to the supraclinoid right ICA. A torque device was then used to manipulate the guidewire into the left middle cerebral artery followed by the microcatheter. The micro guidewire was then advanced without difficulty into the M2 M3 region of the inferior  division of left middle cerebral artery followed by the microcatheter. The guidewire was removed. Good aspiration was obtained from the hub of the microcatheter. A gentle contrast injection demonstrated antegrade flow of contrast. At this time, a 4 mm x 40 mm Solitaire FR retrieval device was advanced in a coaxial manner and with constant heparinized saline infusion using biplane roadmap technique and constant fluoroscopic guidance to the distal portion of the microcatheter. At this time the entire system was straightened by loosening the O ring on the delivery microcatheter. After having ascertained the distal and the proximal positioning of the retrieval device, with slight forward gentle traction with the right hand on  the delivery micro guidewire, with the left hand the microcatheter was retrieved unsheathing the entire Solitaire FR retrieval device with the tip of the microcatheter just proximal to the proximal landing zone of the retrieval device. A control arteriogram performed through the 8 [place] FlowGate guide catheter demonstrated significantly improved caliber and flow through the middle cerebral artery distribution with a narrowing of the retrieval device in the distal M1 segment. At this time, the balloon of the [facility] guide catheter was inflated in the distal left internal carotid artery for proximal flow arrest. The proximal portion of the retrieval device was captured into the microcatheter. There on after whilst aspirating with a 60 mL syringe at the hub of the [facility] guide catheter, the combination of the retrieval device and the microcatheter were gently retrieved and removed. The aspirate demonstrated prominent clot measuring approximately 2 mm x 4 mm. Aspiration was continued as the balloon was deflated in the left internal carotid artery. The FlowGate guide catheter was gently retrieved more proximally after deflation of the balloon. A control arteriogram performed through the 8 [place]  FlowGate guide catheter demonstrated complete angiographic revascularization of the left occluded left middle cerebral artery with patency of the left posterior cerebral and left anterior cerebral arteries. Focal segmental spasm was noted of the left middle cerebral artery involving the inferior division in the distal M1 region. This responded to 4 aliquots of 25 mics of nitroglycerin intra-arterially. A final control arteriogram performed demonstrated significant improved caliber of the previously noted vasospasm of the left middle cerebral artery, and also the left internal carotid artery mid cervical segment. Also noted of the final to control arteriograms a focal area of hyper density in the left centrum semiovale region. No evidence of angiographic extravasation or mass effect was seen. Patient's hemodynamic status, blood pressure and heart rate remained stable throughout the procedure. The 8 [place] FlowGate guide catheter and the 8 [place] neurovascular sheath were then retrieved into the abdominal aorta and exchanged over a J-tip guidewire for an 8 French Pinnacle sheath which in turn was successfully removed with the application of a closure device. The right groin showed no evidence of a hematoma or hemorrhage. The distal pulses in both feet remained Dopplerable unchanged from prior to the procedure. IMPRESSION: Status post endovascular complete revascularization of occluded left middle cerebral artery with 1 pass with the Solitaire FR 4 mm x 40 mm retrieval device, with achievement of a TICI 3 reperfusion. Groin puncture to initial reperfusion TICI2b 32 minutes. Groin puncture to TICI reperfusion 38 minutes PLAN: The patient was transferred to the CT scanner for postprocedural CT scan of the brain. Electronically Signed   By: Luanne Bras M.D.   On: 11/29/2016 14:42     Assessment/Plan: Diagnosis: Right hemiparesis. Aphasia and dysphagia secondary to left MCA infarct 1. Does the need for close,  24 hr/day medical supervision in concert with the patient's rehab needs make it unreasonable for this patient to be served in a less intensive setting? Yes 2. Co-Morbidities requiring supervision/potential complications: Atrial fibrillation, status post respiratory failure 3. Due to bladder management, bowel management, safety, skin/wound care, disease management, medication administration, pain management and patient education, does the patient require 24 hr/day rehab nursing? Yes 4. Does the patient require coordinated care of a physician, rehab nurse, PT (1-2 hrs/day, 5 days/week), OT (1-2 hrs/day, 5 days/week) and SLP (.5-1 hrs/day, 5 days/week) to address physical and functional deficits in the context of the above medical diagnosis(es)? Yes Addressing deficits in the  following areas: balance, endurance, locomotion, strength, transferring, bowel/bladder control, bathing, dressing, feeding, grooming, toileting, cognition, speech, language, swallowing and psychosocial support 5. Can the patient actively participate in an intensive therapy program of at least 3 hrs of therapy per day at least 5 days per week? Yes 6. The potential for patient to make measurable gains while on inpatient rehab is good 7. Anticipated functional outcomes upon discharge from inpatient rehab are supervision and min assist  with PT, supervision and min assist with OT, supervision and min assist with SLP. 8. Estimated rehab length of stay to reach the above functional goals is: 19-22d 9. Anticipated D/C setting: Home 10. Anticipated post D/C treatments: [place] therapy 11. Overall Rehab/Functional Prognosis: excellent  RECOMMENDATIONS: This patient's condition is appropriate for continued rehabilitative care in the following setting: CIR Patient has agreed to participate in recommended program. N/A Note that insurance prior authorization may be required for reimbursement for recommended care.  Comment:   Charlett Blake M.D. [facility] FAAPM&R (Sports Med, Neuromuscular Med) Diplomate Am Board of Electrodiagnostic Med  Flora Lipps 12/01/2016    Revision History                             Routing History

## 2016-12-23 NOTE — Progress Notes (Signed)
Rehab admissions - I have approval for re admission to acute inpatient rehab.  Bed available and will admit to inpatient rehab today.  Call me for questions.  #[phone]

## 2016-12-23 NOTE — Progress Notes (Signed)
Rehab admissions - I have opened the case with [facility] and have sent clinicals requesting re admission to acute inpatient rehab.  I will update all once I hear back from insurance case manager.  I do have a rehab bed available today.  Call me for questions.  #[phone]

## 2016-12-23 NOTE — PMR Pre-admission (Signed)
PMR Admission Coordinator Pre-Admission Assessment  Patient: Valerie Mcclure is an 76 y.o., female MRN: [id number] DOB: 1941-03-12 Height: '5\' 2"'$  (157.5 cm) Weight: 76.8 kg (169 lb 6.4 oz)              Insurance Information HMO: Yes   PPO:       PCP:       IPA:       80/20:       OTHER:  Group # [id number] PRIMARY: [place]      Policy#: [id number]      Subscriber:  patient CM Name: Vevelyn Royals      Phone#: [phone]     Fax#: [phone] Pre-Cert#: [id number]      Employer: Retired  Benefits:  Phone #: [phone]     Name:  Hanley Seamen. Date: 07/20/16     Deduct:  $0      Out of Pocket Max: $4400 (met $152.95)      Life Max: N/A CIR: $345 days 1-5      SNF: $0 days 1-20; $160 days 21-48; $0 days 49-100 Outpatient:  Medical necessity     Co-Pay: $40/visit Home Health: 100%      Co-Pay: none DME: 80%     Co-Pay: 20% Providers: in network  Medicaid Application Date:        Case Manager:   Disability Application Date:        Case Worker:    Emergency Facilities manager Information    Name Relation Home Work [place] B Spouse [phone]     [place] Daughter   [phone]   Jim Like Daughter [phone]     [facility] Relative   [phone]     Current Medical History  Patient Admitting Diagnosis:  L MCA infarct, acute encephalopathy  History of Present Illness: A 76 y.o.femalewith history of chronic abdominal and LBP, CKD, hyperlipidemia who fell out of bed on 5/13 am and was found to have left facial weakness with slurred speech and right sided weakness. CT head done revealing hyperdense L-MCA sign with hypodensity in left temporal area. CTA showed left M1 occlusion due to acute thrombus or embolus with significant penumbra left temporoparietal lobe and irregular non-calcified plaque L-ICA. She underwent cerebral angio with complete revascularization of L-MCA with reperfusion. Follow up CCT showed hemorrhagic transformation of left  temporal infarct, large area of hemorrhage and extravasated contrast in left basal ganglia, minimal SDH along tentorium and 6 mm midline shift to right. MRI brain L-MCA infarct with diffusion restriction left temporal lobe, superimposed 5.5 cm intra-axial hemorrhage centered at left basal ganglia with associated left hemisphere edema, effaced left lateral ventricle with trace IVH and occasional small foci of restriction in left thalamus and anterior left occipital lobe.   She was extubated 5/14 pm and Dr. Acie Fredrickson consulted for input on new onset A fib on telemetry. EKG without evidence of A fib. 2 D echo showed EF 55-60% with mild mitral regurg. He felt that patient with multifocal PACs and might be having bouts of A fib due to stress v/s sinus arrhthymias. Question event monitor v/s loop to evaluate for arrhthymias--to have 30 day monitor at discharge and loop recorder if monitor unrevealing. BLE ultrasound without DVT--right groin limited due to bandage. FEES done showing silent penetration with liquids--D1, honey by teaspoon recommended due to dysphagia. Dr. Erlinda Hong felt that stroke embolic due to unknown source and to start ASA on 5/24 for secondary stroke prevention. Speech evaluation revealed moderate dysarthria with  expressive >receptive aphasia with phonemic paraphasias and anomia. Patient with resultant right sided weakness with right lean, right inattention, motor apraxia and DOE. CIR recommended for follow up therapy.   She was admitted to rehab on 12/03/16. On 12/19/16 she became more somnolent and had periods of apnea.  These events responded to 0.'4mg'$  IV narcan administration but re-administration (x3) was needed.  She had a STAT CT head which showed no new acute activity and improved midline shift.  At [place] she was found to have a new personality change after narcan in that she was angry and agitated.  Per nursing the rest of her neuro exam is unchanged.  Her primary service requested transfer to  the ICU for possible narcan gtt. The Narcan drip was subsequently weaned.  Patient developed a flutter.  She was started on metoprolol for heart rate control.  Neurology saw patient with recommendation of elequis as a flutter was likely the cause of her earlier embolic stroke. Patient will need close neuro monitoring, if acute neuro changes will need stat CT head to rule out ICH.  PT/OT/SLP re-evaluations were completed with recommendations to re admit to acute inpatient rehab for completion of her rehab program.  Patient to be re admitted today.   Total: 11=NIH  Past Medical History  Past Medical History:  Diagnosis Date  . Blood transfusion without reported diagnosis   . Chronic low back pain   . Hyperlipidemia   . Osteopenia   . Unspecified essential hypertension   . Unspecified hypothyroidism     Family History  family history includes Heart disease (age of onset: 2) in her father; Lung cancer (age of onset: 63) in her brother.  Prior Rehab/Hospitalizations: Was on CIR from 12/03/16 to 12/19/16.  Transferred back to acute hospital for acute encephalopathy.  Has the patient had major surgery during 100 days prior to admission? No  Current Medications   Current Facility-Administered Medications:  .  0.9 %  sodium chloride infusion, 250 mL, Intravenous, PRN, [place] Nickel, MD, Stopped at 12/21/16 [phone] .  0.9 %  sodium chloride infusion, , Intravenous, Continuous, Lo Corine Shelter, MD, Last Rate: 150 mL/hr at 12/23/16 0742 .  apixaban (ELIQUIS) tablet 5 mg, 5 mg, Oral, BID, Elgergawy, Silver Huguenin, MD .  FLUoxetine (PROZAC) capsule 20 mg, 20 mg, Oral, Daily, Lo Corine Shelter, MD, 20 mg at 12/23/16 1021 .  furosemide (LASIX) tablet 20 mg, 20 mg, Oral, Daily, Lo Corine Shelter, MD, 20 mg at 12/23/16 1021 .  irbesartan (AVAPRO) tablet 150 mg, 150 mg, Oral, Daily, Lo Corine Shelter, MD, 150 mg at 12/23/16 1021 .  levothyroxine (SYNTHROID, LEVOTHROID) tablet 88 mcg, 88 mcg, Oral, QAC breakfast,  Lo Corine Shelter, MD, 88 mcg at 12/23/16 0739 .  MEDLINE mouth rinse, 15 mL, Mouth Rinse, BID, Elgergawy, Silver Huguenin, MD, 15 mL at 12/23/16 1030 .  metoprolol tartrate (LOPRESSOR) tablet 25 mg, 25 mg, Oral, BID, Elgergawy, Silver Huguenin, MD, 25 mg at 12/23/16 1257 .  naloxone [facility]) injection 0.2 mg, 0.2 mg, Intravenous, PRN, Vickii Chafe Corine Shelter, MD, 0.2 mg at 12/19/16 2249 .  naloxone [facility]) injection 0.4 mg, 0.4 mg, Intravenous, PRN, Anders Simmonds, MD .  nortriptyline (PAMELOR) capsule 20 mg, 20 mg, Oral, QHS, [place] Nickel, MD, 20 mg at 12/22/16 2306 .  RESOURCE THICKENUP CLEAR, , Oral, PRN, Brand Males, MD .  simvastatin (ZOCOR) tablet 10 mg, 10 mg, Oral, q1800, Vickii Chafe Corine Shelter, MD, 10 mg at 12/22/16 1756  Patients Current  Diet: DIET DYS 2 Room service appropriate? Yes with Assist; Fluid consistency: Nectar Thick  Precautions / Restrictions Precautions Precautions: Fall Precaution Comments: Rt hemiparesis Restrictions Weight Bearing Restrictions: No   Has the patient had 2 or more falls or a fall with injury in the past year?Yes.  Family reports about 7 falls with no injury.  Prior Activity Level Community (5-7x/wk): Went out daily, was driving.  Home Assistive Devices / Equipment Home Assistive Devices/Equipment: Gilmer Mor (specify quad or straight), Grab bars around toilet, Grab bars in shower, Environmental consultant (specify type), Scales, Wheelchair, Built-in shower seat, Reacher Home Equipment: Cane - single point  Prior Device Use: Indicate devices/aids used by the patient prior to current illness, exacerbation or injury? Cane  Prior Functional Level Prior Function Level of Independence: Independent with assistive device(s) Comments: Has been at CIR prior to current admission. Prior to recent admit for CVA, patient occasionally used cane for ambulation.  Self Care: Did the patient need help bathing, dressing, using the toilet or eating?  Independent  Indoor Mobility: Did the patient  need assistance with walking from room to room (with or without device)? Independent  Stairs: Did the patient need assistance with internal or external stairs (with or without device)? Dependent  Functional Cognition: Did the patient need help planning regular tasks such as shopping or remembering to take medications? Independent  Current Functional Level Cognition  Overall Cognitive Status: Impaired/Different from baseline Difficult to assess due to: Impaired communication Current Attention Level: Selective Orientation Level: Oriented to person, Oriented to place Following Commands: Follows one step commands consistently, Follows multi-step commands inconsistently Safety/Judgement: Decreased awareness of safety, Decreased awareness of deficits General Comments: Able to follow one-step commands consistently. Significant word finding deficits impacting ability to assess cognition. Able to tell me that she is in [place] when given multiple choice options.     Extremity Assessment (includes Sensation/Coordination)  Upper Extremity Assessment: RUE deficits/detail RUE Deficits / Details: Pain with PROM greater than 90 degrees in forward flexion and abduction. Decreased grasp and elbow flexion/extension strength to 3+/5 grossly. Shoulder AROM limited to 30 degrees in flexion and abduction.   Lower Extremity Assessment: Defer to PT evaluation RLE Deficits / Details: Strength grossly 3/5.  Decreased coordination.  RLE Coordination: decreased gross motor    ADLs  Overall ADL's : Needs assistance/impaired Eating/Feeding: Minimal assistance, Sitting Grooming: Sitting, Moderate assistance, Applying deodorant, Wash/dry face, Wash/dry hands (and applying lotion) Grooming Details (indicate cue type and reason): Mod assist to incorporate R UE into activity and for thoroughness when applying lotion and deoderant. Upper Body Bathing: Moderate assistance, Sitting Lower Body Bathing: Maximal assistance,  Sitting/lateral leans Upper Body Dressing : Moderate assistance, Sitting Lower Body Dressing: Maximal assistance, Sitting/lateral leans General ADL Comments: Pt able to complete ADL seated at EOB. She demonstrates decreased dynamic sitting balance, ataxic movement, and poor motor planning as well as R hemiparesis impacting ability to complete ADL.    Mobility  Overal bed mobility: Needs Assistance Bed Mobility: Rolling, Sidelying to Sit, Sit to Sidelying Rolling: Min assist Sidelying to sit: Mod assist Sit to sidelying: Max assist General bed mobility comments: Verbal and tactile cues for sequencing. Multimodal cues for use of R UE.    Transfers  Overall transfer level: Needs assistance Equipment used: 2 person hand held assist Transfers: Sit to/from Stand Sit to Stand: Mod assist, +2 physical assistance General transfer comment: NT this session    Ambulation / Gait / Stairs / Wheelchair Mobility  Ambulation/Gait General Gait Details: NT  Posture / Balance Dynamic Sitting Balance Sitting balance - Comments: Min guard for EOB ADL. Balance Overall balance assessment: Needs assistance Sitting-balance support: Single extremity supported, Feet supported Sitting balance-Leahy Scale: Fair Sitting balance - Comments: Min guard for EOB ADL. Postural control: Posterior lean Standing balance support: Bilateral upper extremity supported Standing balance-Leahy Scale: Poor Standing balance comment: Posterior and Rt lateral lean.    Special needs/care consideration BiPAP/CPAP No CPM No Continuous Drip IV 0.9% NS at 150 mL/hr Dialysis No       Life Vest No Oxygen No Special Bed No Trach Size No Wound Vac (area) No Skin No                            Bowel mgmt: Last BM 12/22/16 Bladder mgmt: External catheter, incontinence Diabetic mgmt No    Previous Home Environment Living Arrangements: Spouse/significant other, Children Available Help at Discharge: Family, Available 24  hours/day Type of Home: Apartment Home Layout: One level Home Access: Level entry Bathroom Shower/Tub: Tub/shower unit Home Care Services: No  Discharge Living Setting Plans for Discharge Living Setting: Lives with (comment), Apartment (Lives with husband, married for 20 years.) Type of Home at Discharge: Apartment Discharge Home Layout: One level Discharge Home Access: Level entry Does the patient have any problems obtaining your medications?: No  Social/Family/Support Systems Patient Roles: Spouse, Parent, Other (Comment) (Has husband, 2 dtrs, step dtr, grandson) Contact Information: "[name]" Carboni - husband - [phone] Anticipated Caregiver: Dtrs, step dtr, husband, family Anticipated Caregiver's Contact Information: [name] - dtr [phone] Ability/Limitations of Caregiver: Husband is retired and can assist.  Scientist, research (physical sciences) and step daughter can alternate and assist Caregiver Availability: 24/7 Discharge Plan Discussed with Primary Caregiver: Yes Is Caregiver In Agreement with Plan?: Yes Does Caregiver/Family have Issues with Lodging/Transportation while Pt is in Rehab?: No  Goals/Additional Needs Patient/Family Goal for Rehab: PT/OT/SLP supervision to min assist goals Expected length of stay: 7-10 days Cultural Considerations: [employer] Dietary Needs: Dys 2, nectar thick liquids Equipment Needs: TBD Pt/Family Agrees to Admission and willing to participate: Yes (Met with husband and daughter, [name].) Program Orientation Provided & Reviewed with Pt/Caregiver Including Roles  & Responsibilities: Yes  Decrease burden of Care through IP rehab admission: N/A  Possible need for SNF placement upon discharge: No, family have refused NHP   Patient Condition: This patient's medical and functional status has changed since the consult dated: Original consult completed 12/01/16 in which the Rehabilitation Physician determined and documented that the patient's condition is  appropriate for intensive rehabilitative care in an inpatient rehabilitation facility. See "History of Present Illness" (above) for medical update. Functional changes are:  Currently requiring mod assist for transfers and mod to max assist for ADLs. Patient's medical and functional status update has been discussed with the Rehabilitation physician and patient remains appropriate for inpatient rehabilitation. Will admit to inpatient rehab today.  Preadmission Screen Completed By:  Retta Diones, 12/23/2016 2:14 PM ______________________________________________________________________   Discussed status with Dr. Posey Pronto on 12/23/16 at 1414 and received telephone approval for admission today.  Admission Coordinator:  Retta Diones, time 1414/Date 12/23/16

## 2016-12-23 NOTE — Discharge Summary (Signed)
Valerie Mcclure, is a 76 y.o. female  DOB 1941/03/27  MRN [id number].  Admission date:  12/19/2016  Admitting Physician  Brand Males, MD  Discharge Date:  12/23/2016   Primary MD  [name], MD  Recommendations for primary care physician for things to follow:  - Patient will need 6 weeks follow up with neurology after discharge, Ambulatory  referral has been made by neuro. - Patient started on Eliquis for A fib, please monitor neurologic status closely, and if acute neuro changes, will need stat CT head to rule out ICH. - Patient with new onset A. Fib, started on metoprolol for heart rate controlled, please adjust dose as needed.   Admission Diagnosis  CVA   Discharge Diagnosis  CVA    Principal Problem:   Toxic encephalopathy Active Problems:   Hypothyroidism   Hyperlipidemia   Nontraumatic subcortical hemorrhage of left cerebral hemisphere ([name])   Right hemiparesis ([name])   Benign essential HTN   Dysphagia, post-stroke   Spastic hemiplegia of right dominant side as late effect of cerebral infarction [facility])   Narcotic overdose   Hypokalemia   Obesity (BMI 30-39.9)   Atrial flutter ([place])      Past Medical History:  Diagnosis Date  . Blood transfusion without reported diagnosis   . Chronic low back pain   . Hyperlipidemia   . Osteopenia   . Unspecified essential hypertension   . Unspecified hypothyroidism     Past Surgical History:  Procedure Laterality Date  . ABDOMINAL HYSTERECTOMY  1970  . CHOLECYSTECTOMY  07/2009   Dr. Rise Patience  . COLONOSCOPY  2003  . FLEXIBLE SIGMOIDOSCOPY  2010  . HAND SURGERY    . IR ANGIO INTRA EXTRACRAN SEL COM CAROTID INNOMINATE UNI L MOD SED  11/29/2016  . IR ANGIO VERTEBRAL SEL SUBCLAVIAN INNOMINATE UNI R MOD SED  11/29/2016  . IR PERCUTANEOUS ART THROMBECTOMY/INFUSION INTRACRANIAL INC DIAG ANGIO  11/29/2016  . LUMBAR LAMINECTOMY  11/2008   Done by Dr. Patrice Paradise  . RADIOLOGY WITH ANESTHESIA N/A 11/29/2016   Procedure: RADIOLOGY WITH ANESTHESIA;  Surgeon: Radiologist, Medication, MD;  Location: [place];  Service: Radiology;  Laterality: N/A;  . THYROIDECTOMY         History of present illness and  Hospital Course:     Kindly see H&P for history of present illness and admission details, please review complete Labs, Consult reports and Test reports for all details in brief  HPI  from the history and physical done on the day of admission 12/19/2016 Patient is not cooperative and unable to provide HPI, the following is taken from chart review and discussion with family and nursing: 76 yo CF with h/o CKD, hyperlipidemia admitted with L-MCA CVA with secondary hemorrhagic conversion L temporal area. CTA showed left M1 occlusion due to acute thrombus or embolus with significant penumbra left temporoparietal lobe and irregular non-calcified plaque L-ICA. She underwent cerebral angio with complete revascularization of L-MCA with reperfusion. Follow up CCT showed large Left temporal hemorrhagic transformation and extravasated  contrast in left basal ganglia and midline shift.  MRI brain L-MCA infarct with diffusion restriction left temporal lobe, superimposed 5.5 cm intra-axial hemorrhage centered at left basal ganglia with associated left hemisphere edema, effaced left lateral ventricle with trace IVH and occasional small foci of restriction in left thalamus and anterior left occipital lobe.   SIGNIFICANT EVENTS: She was transferred to the rehab facility and was doing well as of this morning.  However, this afternoon she became more somnolent and had periods of apnea.  These events responded to 0.4mg  IV narcan administration but re-administration (x3) was needed.  She had a STAT CT head which showed no new acute activity and improved midline shift.  At [place] she was found to have a new personality change after narcan in that she was angry and agitated.   Per nursing the rest of her neuro exam is unchanged.  Her primary service requested transfer to the ICU for possible narcan gtt.    Valerie Mcclure is an 76 y.o. female with a PMH of CKD, hypertension, hyperlipidemia, recent hospital admission 11/29/16-12/03/16 for treatment of an acute left MCA infarct status post mechanical thrombectomy complicated by nontraumatic subcortical hemorrhage of left basal ganglia hemisphere and hemorrhagic transformation with midline shift, discharged to [place] who was doing well with rehabilitation until 12/19/16 when she became somnolent and had periods of apnea. A stat CT showed no new acute findings and improvement in the midline shift. She seemed to respond to repeated doses of Narcan, but in the evening, she had a personality change, becoming agitated and angry. She was readmitted by the critical care team to the ICU for initiation of a Narcan drip. The Narcan drip was subsequently weaned and her care was transferred to [facility] 12/21/16.  Toxic encephalopathy secondary to Narcotic overdose - Resolved, initially requiring Narcan drip, no evidence of infectious process or acute CVA.    Atrial flutter with variable AV block - On telemetry, patient in a flutter, overall heart rate is controlled, goes occasionally to 110s, will start metoprolol for heart rate control, we'll discharge on 37.5 mg twice a day, this has to be adjusted further for optimal heart rate and blood pressure control. - Discussed with the neurology, recommendation for anticoagulation with eliquis 5 mg twice a day as a flutter Likely the cause of her earlier embolic stroke - Patient will need close neuro monitoring, if acute neuro changes will need stat CT head to rule out ICH - risks and benefits of full anticoagulation was discussed with family   Hypothyroidism on Synthroid.    Hyperlipidemia  on Zocor.    Nontraumatic subcortical hemorrhage of left cerebral hemisphere  ([name])/Right hemiparesis ([place]) - We'll go to CIR - Before meals above discussion regarding anticoagulation - Patient will need close neuro monitoring, if acute neuro changes will need stat CT head to rule out ICH    Benign essential HTN Continue with  Lasix, Avapro, nifedipine has been stopped as she was started on metoprolol    Dysphagia, post-stroke Continue aspiration precautions and thickened liquids.     Hypokalemia Repleted    Obesity Body mass index is 30.36 kg/m.    Discharge Condition:  Stable Discussed with daughter at bedside   Follow UP  Follow-up Information    Rosalin Hawking, MD. Schedule an appointment as soon as possible for a visit in 6 week(s).   Specialty:  Neurology Contact information: [address] [place] [address] [phone]  Discharge Instructions  and  Discharge Medications     Discharge Instructions    Ambulatory referral to Neurology    Complete by:  As directed    Pt will follow up with stroke MD Erlinda Hong preferred, if not available, then consider [name], Penumalli or [place]) at [facility] in about 2 months. Thanks.   Discharge instructions    Complete by:  As directed    Physical therapy per PT Continue with SLP follow-up, patient is on dysphagia 2 with nectar thick     Allergies as of 12/23/2016      Reactions   Shrimp [shellfish Allergy]    Break outs and swelling    Tandem Plus [fefum-fepo-[id number] [id number]] Nausea And Vomiting   Ivp Dye [iodinated Diagnostic Agents] Rash   itching   Penicillins Rash   itching      Medication List    STOP taking these medications   morphine 15 MG 12 hr tablet Commonly known as:  MS CONTIN   NIFEdipine 30 MG 24 hr tablet Commonly known as:  PROCARDIA-XL/ADALAT-CC/NIFEDICAL-XL     TAKE these medications   apixaban 5 MG Tabs tablet Commonly known as:  ELIQUIS Take 1 tablet (5 mg total) by mouth 2 (two) times daily.   CALCIUM 1200 PO Take by mouth  daily.   FLUoxetine 20 MG capsule Commonly known as:  PROZAC Take 1 capsule (20 mg total) by mouth daily. What changed:  how much to take   furosemide 20 MG tablet Commonly known as:  LASIX TAKE 1 TABLET BY MOUTH DAILY AS NEEDED   levothyroxine 88 MCG tablet Commonly known as:  SYNTHROID, LEVOTHROID Take 1 tablet (88 mcg total) by mouth daily.   metoprolol tartrate 25 MG tablet Commonly known as:  LOPRESSOR Take 1.5 tablets (37.5 mg total) by mouth 2 (two) times daily. What changed:  See the new instructions.   nortriptyline 10 MG capsule Commonly known as:  PAMELOR TAKE 2 CAPSULES BY MOUTH AT BEDTIME   simvastatin 10 MG tablet Commonly known as:  ZOCOR TAKE 1 TABLET(10 MG) BY MOUTH DAILY   valsartan 160 MG tablet Commonly known as:  DIOVAN Take 1 tablet (160 mg total) by mouth daily.   vitamin B-12 100 MCG tablet Commonly known as:  CYANOCOBALAMIN Take 100 mcg by mouth daily.   Vitamin D3 1000 units Caps Take by mouth daily.         Diet and Activity recommendation: See Discharge Instructions above   Consults obtained -   None,  D/W neuro  Major procedures and Radiology Reports - PLEASE review detailed and final reports for all details, in brief -      Ct Angio Head W Or Wo Contrast  Result Date: 11/29/2016 CLINICAL DATA:  Right facial droop and slurred speech. Last seen normal 10 p.m. last night. Stroke. EXAM: CT ANGIOGRAPHY HEAD AND NECK CT PERFUSION BRAIN TECHNIQUE: Multidetector CT imaging of the head and neck was performed using the standard protocol during bolus administration of intravenous contrast. Multiplanar CT image reconstructions and MIPs were obtained to evaluate the vascular anatomy. Carotid stenosis measurements (when applicable) are obtained utilizing NASCET criteria, using the distal internal carotid diameter as the denominator. Multiphase CT imaging of the brain was performed following IV bolus contrast injection. Subsequent parametric  perfusion maps were calculated using RAPID software. CONTRAST:  80 mL Isovue 370 IV COMPARISON:  CT head 11/29/2016 FINDINGS: CTA NECK FINDINGS Aortic arch: Mild atherosclerotic disease in the aortic arch. Proximal great vessels widely patent.  Right carotid system: Mild atherosclerotic disease at the right carotid bifurcation. No significant carotid stenosis. Left carotid system: Noncalcified plaque along the medial wall of the left internal carotid artery with mild irregularity but no definite ulceration. Less than 25% diameter stenosis of the left internal carotid artery. Left external carotid artery patent. Vertebral arteries: Both vertebral arteries patent to the basilar without significant stenosis. Skeleton: Moderate cervical spine degenerative change. No acute skeletal abnormality. Other neck: 10 mm rim calcified nodule in the right lobe of the thyroid, with a benign appearance. Smaller partially calcified nodule in the right upper lobe of the thyroid also likely benign. Upper chest: Mild mosaic pattern of lung density in the right lung apex. Question mild edema. Review of the MIP images confirms the above findings CTA HEAD FINDINGS Anterior circulation: Mild atherosclerotic disease in the cavernous carotid bilaterally which is calcified but non stenotic. Left M1 segment occluded. This is hyperdense on CT and appears acute. There is perfusion of the left MCA branches due to collaterals. Posterior circulation: Both vertebral arteries patent to the basilar. PICA patent bilaterally. Basilar patent. Superior cerebellar and posterior cerebral arteries patent bilaterally without stenosis. Venous sinuses: Patent Anatomic variants: None Delayed phase: Not performed Review of the MIP images confirms the above findings CT Brain Perfusion Findings: CBF (<30%) Volume: 5 mLmL. However, review of the head CT of earlier today, there is hypodensity left temporal lobe compatible with acute infarct which is larger than  indicated on CT perfusion. Perfusion (Tmax>6.0s) volume: 52 mLmL Perfusion (T-max greater than 4 seconds) volume:  99 mL Mismatch Volume: 47 mL.ML. Review of the perfusion images reveals significant penumbra greater than the calculated amount involving the left temporal parietal lobe. 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 Hypodensity left lower temporal lobe compatible with acute infarct. There is significant penumbra in the left temporoparietal lobe. Irregular noncalcified plaque left internal carotid artery could be a source of emboli or not the high non stenotic atherosclerotic disease. No significant stenosis in the carotid or vertebral arteries in the neck. Atherosclerotic calcification in the cavernous carotid bilaterally. These results were called by telephone at the time of interpretation on 11/29/2016 at 10:50 Am to Dr. Rogue Jury , who verbally acknowledged these results. Electronically Signed   By: Franchot Gallo M.D.   On: 11/29/2016 11:11   Dg Chest 2 View  Result Date: 11/29/2016 CLINICAL DATA:  Code stroke, slurred speech, right-sided droop. EXAM: CHEST  2 VIEW COMPARISON:  Chest x-ray dated 01/01/2009. FINDINGS: Stable cardiomegaly. Lungs are clear. No pleural effusion or pneumothorax seen. Atherosclerotic changes noted at the aortic arch. No acute or suspicious osseous finding. IMPRESSION: 1. No active cardiopulmonary disease. No evidence of pneumonia or pulmonary edema. 2. Stable cardiomegaly. 3. Aortic atherosclerosis. Electronically Signed   By: Franki Cabot M.D.   On: 11/29/2016 09:27   Ct Head Wo Contrast  Result Date: 12/20/2016 CLINICAL DATA:  Acute onset of mental deterioration. Initial encounter. EXAM: CT HEAD WITHOUT CONTRAST TECHNIQUE: Contiguous axial images were obtained from the base of the skull through the vertex without intravenous contrast. COMPARISON:  CT of the head performed earlier today at  4:16 p.m. FINDINGS: Brain: The evolving subacute left MCA territory infarct is grossly unchanged from the recent prior study, with partial effacement of the left lateral ventricle, and approximately 2 mm of rightward shift. Previously noted hemorrhage transformation at the left basal ganglia is stable in appearance. No new infarcts are identified. There  is no evidence of mass lesion. There is no evidence of hydrocephalus at this time. Mild cerebellar atrophy is noted. The brainstem and fourth ventricle are unremarkable in appearance. Vascular: No hyperdense vessel or unexpected calcification. Skull: There is no evidence of fracture; visualized osseous structures are unremarkable in appearance. Sinuses/Orbits: The orbits are within normal limits. The paranasal sinuses and mastoid air cells are well-aerated. Other: No significant soft tissue abnormalities are seen. IMPRESSION: Evolving subacute left MCA territory infarct is grossly unchanged from the recent prior study, with partial effacement of the left lateral ventricle, and approximately 2 mm of rightward shift. Previously noted hemorrhagic transformation at the left basal ganglia is stable in appearance. Electronically Signed   By: Garald Balding M.D.   On: 12/20/2016 00:21   Ct Head Wo Contrast  Result Date: 12/19/2016 CLINICAL DATA:  Loss consciousness for 10 seconds. EXAM: CT HEAD WITHOUT CONTRAST TECHNIQUE: Contiguous axial images were obtained from the base of the skull through the vertex without intravenous contrast. COMPARISON:  Brain MRI 11/30/2016 FINDINGS: Brain: Subacute left MCA territory infarct with cytotoxic edema involving the left basal ganglia, deep white matter tracts, and lateral temporal lobe primarily. There was hemorrhagic conversion at basal ganglia, with decreased clot mass effect and high-density since prior. High-density is now mainly seen at the level of the putamen. Midline shift now measures 1-2 mm, as compared to 7 mm previously.  No acute hemorrhage. No evidence of progressive infarct. Vascular: Atherosclerotic calcification. No hyperdense vessel noted. Skull: No acute or aggressive finding. Sinuses/Orbits: Negative IMPRESSION: 1. No acute finding. 2. Expected evolution of subacute left MCA territory infarct with hemorrhagic conversion. Midline shift is nearly resolved. Electronically Signed   By: Monte Fantasia M.D.   On: 12/19/2016 16:43   Ct Head Wo Contrast  Result Date: 11/29/2016 CLINICAL DATA:  Stroke post thrombectomy EXAM: CT HEAD WITHOUT CONTRAST TECHNIQUE: Contiguous axial images were obtained from the base of the skull through the vertex without intravenous contrast. COMPARISON:  CT head 11/29/2016 FINDINGS: Brain: High-density within the left putamen and left caudate compatible with acute hemorrhage. There is also high density in the superior basal ganglia compatible with extravasation of contrast. The high density area is most likely a mixture of blood and contrast. This area measures approximately 6.1 x 3.5 x 3.7 cm, acute blood volume 39 mL. Hemorrhagic transformation of left temporal lobe infarct. Compression of the left lateral ventricle due to mass-effect. 6 mm midline shift to the right. Interval development of subdural hemorrhage along the tentorium bilaterally. Vascular: Normal arterial and venous enhancement post angiography. Skull: Negative for fracture. Sinuses/Orbits: Mucosal edema in the paranasal sinuses. Normal orbit. Other: None IMPRESSION: Large area of hemorrhage and extravasated contrast in the left basal ganglia, blood volume 39 mL. Hemorrhagic transformation of left temporal infarct. Mild amount of subdural hematoma along the tentorium bilaterally. 6 mm midline shift to the right. These results were called by telephone at the time of interpretation on 11/29/2016 at 2:20 pm to Dr. Luanne Bras , who verbally acknowledged these results. Electronically Signed   By: Franchot Gallo M.D.   On: 11/29/2016  14:21   Ct Head Wo Contrast  Result Date: 11/29/2016 CLINICAL DATA:  Left facial droop.  Stroke symptoms EXAM: CT HEAD WITHOUT CONTRAST TECHNIQUE: Contiguous axial images were obtained from the base of the skull through the vertex without intravenous contrast. COMPARISON:  None. FINDINGS: Brain: Ill-defined hypodensity in the left inferior temporal lobe, suspicious for acute infarct. Negative for acute hemorrhage. Ventricle  size normal. No shift of the midline structures. Vascular: Hyperdense left MCA compatible with thrombus. This correlates with the left temporal lobe infarction. Skull: Negative Sinuses/Orbits: Negative Other: None IMPRESSION: Hypodensity left temporal lobe consistent with acute infarct. Hyperdense left MCA compatible with thrombus or embolus. Aspects score 8 These results were called by telephone at the time of interpretation on 11/29/2016 at 9:14 am to Dr. Veryl Speak , who verbally acknowledged these results. Electronically Signed   By: Franchot Gallo M.D.   On: 11/29/2016 09:17   Ct Angio Neck W Or Wo Contrast  Result Date: 11/29/2016 CLINICAL DATA:  Right facial droop and slurred speech. Last seen normal 10 p.m. last night. Stroke. EXAM: CT ANGIOGRAPHY HEAD AND NECK CT PERFUSION BRAIN TECHNIQUE: Multidetector CT imaging of the head and neck was performed using the standard protocol during bolus administration of intravenous contrast. Multiplanar CT image reconstructions and MIPs were obtained to evaluate the vascular anatomy. Carotid stenosis measurements (when applicable) are obtained utilizing NASCET criteria, using the distal internal carotid diameter as the denominator. Multiphase CT imaging of the brain was performed following IV bolus contrast injection. Subsequent parametric perfusion maps were calculated using RAPID software. CONTRAST:  80 mL Isovue 370 IV COMPARISON:  CT head 11/29/2016 FINDINGS: CTA NECK FINDINGS Aortic arch: Mild atherosclerotic disease in the aortic arch.  Proximal great vessels widely patent. Right carotid system: Mild atherosclerotic disease at the right carotid bifurcation. No significant carotid stenosis. Left carotid system: Noncalcified plaque along the medial wall of the left internal carotid artery with mild irregularity but no definite ulceration. Less than 25% diameter stenosis of the left internal carotid artery. Left external carotid artery patent. Vertebral arteries: Both vertebral arteries patent to the basilar without significant stenosis. Skeleton: Moderate cervical spine degenerative change. No acute skeletal abnormality. Other neck: 10 mm rim calcified nodule in the right lobe of the thyroid, with a benign appearance. Smaller partially calcified nodule in the right upper lobe of the thyroid also likely benign. Upper chest: Mild mosaic pattern of lung density in the right lung apex. Question mild edema. Review of the MIP images confirms the above findings CTA HEAD FINDINGS Anterior circulation: Mild atherosclerotic disease in the cavernous carotid bilaterally which is calcified but non stenotic. Left M1 segment occluded. This is hyperdense on CT and appears acute. There is perfusion of the left MCA branches due to collaterals. Posterior circulation: Both vertebral arteries patent to the basilar. PICA patent bilaterally. Basilar patent. Superior cerebellar and posterior cerebral arteries patent bilaterally without stenosis. Venous sinuses: Patent Anatomic variants: None Delayed phase: Not performed Review of the MIP images confirms the above findings CT Brain Perfusion Findings: CBF (<30%) Volume: 5 mLmL. However, review of the head CT of earlier today, there is hypodensity left temporal lobe compatible with acute infarct which is larger than indicated on CT perfusion. Perfusion (Tmax>6.0s) volume: 52 mLmL Perfusion (T-max greater than 4 seconds) volume:  99 mL Mismatch Volume: 47 mL.ML. Review of the perfusion images reveals significant penumbra  greater than the calculated amount involving the left temporal parietal lobe. 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 Hypodensity left lower temporal lobe compatible with acute infarct. There is significant penumbra in the left temporoparietal lobe. Irregular noncalcified plaque left internal carotid artery could be a source of emboli or not the high non stenotic atherosclerotic disease. No significant stenosis in the carotid or vertebral arteries in the neck. Atherosclerotic calcification in the cavernous carotid bilaterally. These  results were called by telephone at the time of interpretation on 11/29/2016 at 10:50 Am to Dr. Rogue Jury , who verbally acknowledged these results. Electronically Signed   By: Franchot Gallo M.D.   On: 11/29/2016 11:11   Mr Brain Wo Contrast  Result Date: 11/30/2016 CLINICAL DATA:  76 year old female who awoke with aphasia and right facial droop. Left M1 occlusion status post neuro endovascular revascularization. Subsequent large left hemisphere hemorrhage. EXAM: MRI HEAD WITHOUT CONTRAST TECHNIQUE: Multiplanar, multiecho pulse sequences of the brain and surrounding structures were obtained without intravenous contrast. COMPARISON:  Post endovascular Head CT 11/29/2016, and earlier. FINDINGS: Brain: Cortical and subcortical white matter restricted diffusion along the anterior left temporal tip and tracking posteriorly along the left superior and middle temporal gyri (Series 5, image 19). Superimposed intracranial hemorrhage centered at the left basal ganglia with T2 and T1 heterogeneous blood products encompassing 55 x 30 x 31 mm (AP by transverse by CC) for an estimated blood volume of 26 mL. Associated diffusion susceptibility in the area of the hemorrhage, but there also seems to be some marginal restricted diffusion. Small volume of intraventricular hemorrhage layering in the occipital horns. Effaced  left lateral ventricle. No ventriculomegaly. Edema surrounding the area of hemorrhage, and cytotoxic edema in the left temporal lobe. Rightward midline shift of 7 mm. Patent basilar cisterns. There are occasional small foci of restricted diffusion in the left PCA territory including the dorsal left thalamus and anterior left occipital lobe. No posterior fossa or contralateral right hemisphere diffusion restriction. Patchy nonspecific T2 hyperintensity in the pons. No cortical encephalomalacia. No chronic cerebral blood products. Negative pituitary and cervicomedullary junction. Vascular: Major intracranial vascular flow voids are preserved. Skull and upper cervical spine: Negative. Sinuses/Orbits: Normal orbits soft tissues. Visualized paranasal sinuses and mastoids are stable and well pneumatized. Other: Layering fluid in the pharynx.  Negative scalp soft tissues. IMPRESSION: 1. Left MCA infarct with the most confluent diffusion restriction in the left temporal lobe. Superimposed 5.5 cm intra-axial hemorrhage centered at the left basal ganglia with intra-axial blood volume estimated 26 mL. Associated left hemisphere edema. 2. Rightward midline shift of 7 mm.  Basilar cisterns remain patent. 3. Effaced left lateral ventricle and trace intraventricular hemorrhage without ventriculomegaly. 4. Occasional small foci of restricted diffusion also at the left PCA territory. Electronically Signed   By: Genevie Ann M.D.   On: 11/30/2016 13:59   Ct Cerebral Perfusion W Contrast  Result Date: 11/29/2016 CLINICAL DATA:  Right facial droop and slurred speech. Last seen normal 10 p.m. last night. Stroke. EXAM: CT ANGIOGRAPHY HEAD AND NECK CT PERFUSION BRAIN TECHNIQUE: Multidetector CT imaging of the head and neck was performed using the standard protocol during bolus administration of intravenous contrast. Multiplanar CT image reconstructions and MIPs were obtained to evaluate the vascular anatomy. Carotid stenosis measurements  (when applicable) are obtained utilizing NASCET criteria, using the distal internal carotid diameter as the denominator. Multiphase CT imaging of the brain was performed following IV bolus contrast injection. Subsequent parametric perfusion maps were calculated using RAPID software. CONTRAST:  80 mL Isovue 370 IV COMPARISON:  CT head 11/29/2016 FINDINGS: CTA NECK FINDINGS Aortic arch: Mild atherosclerotic disease in the aortic arch. Proximal great vessels widely patent. Right carotid system: Mild atherosclerotic disease at the right carotid bifurcation. No significant carotid stenosis. Left carotid system: Noncalcified plaque along the medial wall of the left internal carotid artery with mild irregularity but no definite ulceration. Less than 25% diameter stenosis of the left internal carotid  artery. Left external carotid artery patent. Vertebral arteries: Both vertebral arteries patent to the basilar without significant stenosis. Skeleton: Moderate cervical spine degenerative change. No acute skeletal abnormality. Other neck: 10 mm rim calcified nodule in the right lobe of the thyroid, with a benign appearance. Smaller partially calcified nodule in the right upper lobe of the thyroid also likely benign. Upper chest: Mild mosaic pattern of lung density in the right lung apex. Question mild edema. Review of the MIP images confirms the above findings CTA HEAD FINDINGS Anterior circulation: Mild atherosclerotic disease in the cavernous carotid bilaterally which is calcified but non stenotic. Left M1 segment occluded. This is hyperdense on CT and appears acute. There is perfusion of the left MCA branches due to collaterals. Posterior circulation: Both vertebral arteries patent to the basilar. PICA patent bilaterally. Basilar patent. Superior cerebellar and posterior cerebral arteries patent bilaterally without stenosis. Venous sinuses: Patent Anatomic variants: None Delayed phase: Not performed Review of the MIP images  confirms the above findings CT Brain Perfusion Findings: CBF (<30%) Volume: 5 mLmL. However, review of the head CT of earlier today, there is hypodensity left temporal lobe compatible with acute infarct which is larger than indicated on CT perfusion. Perfusion (Tmax>6.0s) volume: 52 mLmL Perfusion (T-max greater than 4 seconds) volume:  99 mL Mismatch Volume: 47 mL.ML. Review of the perfusion images reveals significant penumbra greater than the calculated amount involving the left temporal parietal lobe. 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 Hypodensity left lower temporal lobe compatible with acute infarct. There is significant penumbra in the left temporoparietal lobe. Irregular noncalcified plaque left internal carotid artery could be a source of emboli or not the high non stenotic atherosclerotic disease. No significant stenosis in the carotid or vertebral arteries in the neck. Atherosclerotic calcification in the cavernous carotid bilaterally. These results were called by telephone at the time of interpretation on 11/29/2016 at 10:50 Am to Dr. Rogue Jury , who verbally acknowledged these results. Electronically Signed   By: Franchot Gallo M.D.   On: 11/29/2016 11:11   Dg Chest Port 1 View  Result Date: 12/21/2016 CLINICAL DATA:  Acute respiratory failure with hypoxia. EXAM: PORTABLE CHEST 1 VIEW COMPARISON:  12/19/2016 FINDINGS: Chronic cardiomegaly. Chronic aortic atherosclerosis. The lungs remain radiographically clear. No heart failure or effusion. No significant bone finding. IMPRESSION: No active disease. Electronically Signed   By: Nelson Chimes M.D.   On: 12/21/2016 07:01   Dg Chest Port 1 View  Result Date: 12/19/2016 CLINICAL DATA:  Respiratory depression EXAM: PORTABLE CHEST 1 VIEW COMPARISON:  12/01/2016 FINDINGS: Cardiac shadow is mildly enlarged. The lungs are well aerated bilaterally. No focal infiltrate or sizable  effusion is seen. No bony abnormality is noted. IMPRESSION: No acute abnormality seen. Electronically Signed   By: Inez Catalina M.D.   On: 12/19/2016 18:50   Dg Chest Port 1 View  Result Date: 12/01/2016 CLINICAL DATA:  Stroke, history of hypertension, extubation of the trachea and esophagus. EXAM: PORTABLE CHEST 1 VIEW COMPARISON:  Portable chest x-ray of Nov 29, 2016. FINDINGS: The lungs are adequately inflated. There is no focal infiltrate. The retrocardiac lung markings on the left remain mildly prominent. There is no significant pleural effusion and there is no pneumothorax. The heart is top-normal in size. The pulmonary vascularity is normal. There is calcification in the wall of the thoracic aorta. IMPRESSION: Good inflation of both lungs since extubation. Minimal subsegmental atelectasis at the left lung base is  suspected. No CHF. Thoracic aortic atherosclerosis. Electronically Signed   By: David  Martinique M.D.   On: 12/01/2016 07:27   Dg Chest Port 1 View  Result Date: 11/30/2016 CLINICAL DATA:  Confirm orogastric tube placement. EXAM: PORTABLE CHEST 1 VIEW COMPARISON:  Chest radiograph earlier this day at 0907 hour FINDINGS: Endotracheal tube is 2.3 cm from the carina. Enteric tube in place, tip and side-port below the diaphragm. Low lung volumes. Borderline cardiomegaly. No pulmonary edema. No large pleural effusion. No pneumothorax. IMPRESSION: Endotracheal tube approximately 2.3 cm from the carina. Enteric tube in place. Borderline cardiomegaly.  No new abnormality. Electronically Signed   By: Jeb Levering M.D.   On: 11/30/2016 00:28   Dg Abd Portable 1v  Result Date: 11/30/2016 CLINICAL DATA:  76 y/o  F; enteric tube placement. EXAM: PORTABLE ABDOMEN - 1 VIEW COMPARISON:  None. FINDINGS: Nonobstructive bowel gas pattern. Enteric tube tip projects over the stomach. Advanced lumbar levocurvature and lower lumbar fusion changes. IMPRESSION: Enteric tube tip projects over the mid stomach.  Electronically Signed   By: Kristine Garbe M.D.   On: 11/30/2016 00:26   Dg Swallowing Func-speech Pathology  Result Date: 12/08/2016 Objective Swallowing Evaluation: Type of Study: MBS-Modified Barium Swallow Study Patient Details Name: HARLEI LEHRMANN MRN: [id number] Date of Birth: 06-17-1941 Today's Date: 12/08/2016 Time: SLP Start Time (ACUTE ONLY): 0930-SLP Stop Time (ACUTE ONLY): 100 SLP Time Calculation (min) (ACUTE ONLY): 30 min Past Medical History: Past Medical History: Diagnosis Date . Blood transfusion without reported diagnosis  . Chronic low back pain  . Hyperlipidemia  . Osteopenia  . Unspecified essential hypertension  . Unspecified hypothyroidism  Past Surgical History: Past Surgical History: Procedure Laterality Date . ABDOMINAL HYSTERECTOMY  1970 . CHOLECYSTECTOMY  07/2009  Dr. Rise Patience . COLONOSCOPY  2003 . FLEXIBLE SIGMOIDOSCOPY  2010 . HAND SURGERY   . IR ANGIO INTRA EXTRACRAN SEL COM CAROTID INNOMINATE UNI L MOD SED  11/29/2016 . IR ANGIO VERTEBRAL SEL SUBCLAVIAN INNOMINATE UNI R MOD SED  11/29/2016 . IR PERCUTANEOUS ART THROMBECTOMY/INFUSION INTRACRANIAL INC DIAG ANGIO  11/29/2016 . LUMBAR LAMINECTOMY  11/2008  Done by Dr. Patrice Paradise . RADIOLOGY WITH ANESTHESIA N/A 11/29/2016  Procedure: RADIOLOGY WITH ANESTHESIA;  Surgeon: Radiologist, Medication, MD;  Location: [facility];  Service: Radiology;  Laterality: N/A; . THYROIDECTOMY   HPI: Ptis a 76 y.o.femalewith history of chronic low back pain, HTN, HLD, hypothyroidism presenting with aphasia and right hemiplegia on awakening. Shedidnot receive IV t-PA due to delay in arrival but was taken to interventional neuroradiology was she received  TICI3 revascularization of the occluded left MCA with mechanical thrombectomy. Patient with post IR hemorrhage. MRI showed moderate sized L MCA infarct with basal ganglia hematoma with slight intraventricular extension and cytotoxic edema with slight 7 mm of midline shift. She was intubated 5/13-5/14.  Patient admitted to IP Rehab and has been participating in therapies with functional improvements noted in swallow function at bedside.  As a result of documented sensory deficits a repeat objective assessment is warranted prior to upgrade.   Assessment / Plan / Recommendation [facility] IP CLINICAL IMPRESSIONS 12/08/2016 Clinical Impression --MBS complete.  Patient with functional improvements as compared to previous objective assessment.  She continues to demonstrate a mild-moderate oropharyngeal dysphagia with motor planning and sensorimotor deficits.  She demonstrates decreased oral containment, which results in loss of boluses to the level of the vallecula or pyriform sinuses depending on the size of the bolus as well as a delay in swallow initiation.  If  the penetration is trace-mild it is silent in nature; however, otherwise patient has delayed, subtle sensation.  Aspiration of thin liquids resulted in delayed, but sensed penetration.  Patient self-feeding and pacing allowed for intermittent multiple swallows which effectively cleared penetrates from laryngeal vestibule.  Patient with trace residuals throughout pharynx post swallow.  Recommend initiation of a Dys.2 texture and nectar-thick liquid diet with continued full supervision as well as the water protocol.   SLP Visit Diagnosis Dysphagia, oropharyngeal phase (R13.12) Attention and concentration deficit following -- Frontal lobe and executive function deficit following -- Impact on safety and function Mild aspiration risk   [facility] IP TREATMENT RECOMMENDATION 12/08/2016 Treatment Recommendations Other (Comment) Refer to care plan for details    Prognosis 12/08/2016 Prognosis for Safe Diet Advancement Good Barriers to Reach Goals Language deficits;Severity of deficits Barriers/Prognosis Comment -- [facility] IP DIET RECOMMENDATION 12/08/2016 SLP Diet Recommendations Dysphagia 2 (Fine chop) solids;Nectar thick liquid Liquid Administration via Cup;No straw Medication  Administration Whole meds with puree Compensations Slow rate;Small sips/bites;Monitor for anterior loss;Multiple dry swallows after each bite/sip;Follow solids with liquid Postural Changes Seated upright at 90 degrees   [facility] IP OTHER RECOMMENDATIONS 12/08/2016 Recommended Consults -- Oral Care Recommendations Oral care BID Other Recommendations Order thickener from pharmacy;Prohibited food (jello, ice cream, thin soups);Remove water pitcher   [facility] IP FOLLOW UP RECOMMENDATIONS 12/03/2016 Follow up Recommendations Refer to care plan for details    [facility] IP FREQUENCY AND DURATION 12/01/2016 Speech Therapy Frequency (ACUTE ONLY) Refer to care plan for details  Treatment Duration       [facility] IP ORAL PHASE 12/08/2016 Oral Phase Impaired Oral - Pudding Teaspoon -- Oral - Pudding Cup -- Oral - Honey Teaspoon NT Oral - Honey Cup Premature spillage Oral - Nectar Teaspoon NT Oral - Nectar Cup Premature spillage Oral - Nectar Straw -- Oral - Thin Teaspoon NT Oral - Thin Cup Premature spillage;Decreased bolus cohesion Oral - Thin Straw -- Oral - Puree Premature spillage Oral - Mech Soft Premature spillage;Decreased bolus cohesion;Delayed oral transit;Impaired mastication Oral - Regular -- Oral - Multi-Consistency -- Oral - Pill -- Oral Phase - Comment --  [facility] IP PHARYNGEAL PHASE 12/08/2016 Pharyngeal Phase Impaired Pharyngeal- Pudding Teaspoon -- Pharyngeal -- Pharyngeal- Pudding Cup -- Pharyngeal -- Pharyngeal- Honey Teaspoon NT Pharyngeal -- Pharyngeal- Honey Cup Delayed swallow initiation-vallecula Pharyngeal Material does not enter airway Pharyngeal- Nectar Teaspoon NT Pharyngeal -- Pharyngeal- Nectar Cup Delayed swallow initiation-vallecula;Penetration/Aspiration during swallow;Delayed swallow initiation-pyriform sinuses Pharyngeal Material enters airway, remains ABOVE vocal cords then ejected out Pharyngeal- Nectar Straw -- Pharyngeal -- Pharyngeal- Thin Teaspoon NT Pharyngeal -- Pharyngeal- Thin Cup Delayed swallow  initiation-pyriform sinuses;Delayed swallow initiation-vallecula;Penetration/Aspiration during swallow;Trace aspiration Pharyngeal Material enters airway, passes BELOW cords and not ejected out despite cough attempt by patient;Material enters airway, remains ABOVE vocal cords then ejected out;Material enters airway, remains ABOVE vocal cords and not ejected out Pharyngeal- Thin Straw -- Pharyngeal -- Pharyngeal- Puree Delayed swallow initiation-vallecula Pharyngeal -- Pharyngeal- Mechanical Soft Delayed swallow initiation-vallecula Pharyngeal -- Pharyngeal- Regular -- Pharyngeal -- Pharyngeal- Multi-consistency -- Pharyngeal -- Pharyngeal- Pill -- Pharyngeal -- Pharyngeal Comment --  [facility] IP CERVICAL ESOPHAGEAL PHASE 12/08/2016 Cervical Esophageal Phase WFL Pudding Teaspoon -- Pudding Cup -- Honey Teaspoon -- Honey Cup -- Nectar Teaspoon -- Nectar Cup -- Nectar Straw -- Thin Teaspoon -- Thin Cup -- Thin Straw -- Puree -- Mechanical Soft -- Regular -- Multi-consistency -- Pill -- Cervical Esophageal Comment -- No flowsheet data found. Gunnar Fusi, M.A., CCC-SLP [phone] [place] 12/08/2016, 10:32 AM  Ir Percutaneous Art Thrombectomy/infusion Intracranial Inc Diag Angio  Result Date: 11/30/2016 INDICATION: Patient presenting with aphasia and right-sided weakness with left gaze preference. 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 CBF < 30 % 5 ml,Tmax > 6 secs of 52 ml with a mismatch vol of 5ml. EXAM: 1. EMERGENT LARGE VESSEL OCCLUSION THROMBOLYSIS (anterior CIRCULATION) 2. COMPARISON:  CT angiogram of the head and neck, and CT perfusion study of 11/29/2016. MEDICATIONS: Vancomycin 1.5 mg IV was administered within 1 hour of the procedure. ANESTHESIA/SEDATION: General anesthesia. CONTRAST:  Isovue 300 approximately 75 cc. FLUOROSCOPY TIME:  Fluoroscopy Time: 28 minutes 36 seconds (1539 mGy).  COMPLICATIONS: None immediate. TECHNIQUE: Following a full explanation of the procedure along with the potential associated complications, an informed witnessed consent was obtained. The risks of intracranial hemorrhage of 10%, worsening neurological deficit, ventilator dependency, death and inability to revascularize were all reviewed in detail with the patient's . The patient was then put under general anesthesia by the Department of Anesthesiology at [facility]. The right groin was prepped and draped in the usual sterile fashion. Thereafter using modified Seldinger technique, transfemoral access into the right common femoral artery was obtained without difficulty. Over a 0.035 inch guidewire a 5 French Pinnacle sheath was inserted. Through this, and also over a 0.035 inch guidewire a 5 [place] JB 1 catheter was advanced to the aortic arch region and selectively positioned in the right common carotid artery and the left common carotid artery, and the innominate artery. FINDINGS: The right subclavian arteriogram demonstrates the right vertebral artery to opacify normally to the cranial skull base. Flow is noted into the right vertebrobasilar junction and the right posterior-inferior cerebellar artery. Flash opacification of the proximal basilar artery is also noted. The right common carotid arteriogram demonstrates the right external carotid artery and its major branches to be widely patent. The right internal carotid artery at the bulb to the cranial skull base opacifies widely. The petrous, the cavernous and the supraclinoid segments are widely patent. The right middle cerebral artery and the right anterior cerebral artery opacify into the capillary and venous phases. Flash opacification via the anterior communicating artery of the left anterior cerebral artery A2 segment is also noted. Also noted is contrast blush in the region of the middle terminate of the right maxillary sinus most consistent with  inflammatory reaction. The left common carotid arteriogram demonstrates the left external carotid artery and its major branches to be widely patent. The left internal carotid artery at the bulb to the cranial skull base opacifies widely. There are mild FMD-like changes noted in the mid left internal carotid artery mid cervical segment. The petrous, the cavernous and the supraclinoid segments demonstrate wide patency. A left posterior communicating artery is seen opacifying the left posterior cerebral artery distribution. The left anterior cerebral artery opacifies into the capillary and venous phases with flash filling via the anterior communicating artery of the right anterior cerebral artery A2 and A1 segments. The left middle cerebral artery demonstrates complete angiographic occlusion in its mid M1 segment with delayed opacification of an anterior temporal branch with prominent filling defects proximally. The M1 segment, otherwise, remains completely occluded. The delayed arterial phase in the lateral projection demonstrates partial retrograde opacification of the distal perisylvian branches with large area of hypoperfusion involving the lentiform nucleus and the caudate head. PROCEDURE: ENDOVASCULAR COMPLETE REVASCULARIZATION OF OCCLUDED LEFT MIDDLE CEREBRAL ARTERY M1  SEGMENT The diagnostic JB 1 catheter in the left common carotid artery was then exchanged over a 0.035 inch 300 cm Rosen exchange guidewire for an 8 French 55 cm Brite tip neurovascular sheath using biplane roadmap technique and constant fluoroscopic guidance. Good aspiration was obtained from the side port of the neurovascular sheath. This was then connected to continuous heparinized saline infusion. Over the [employer] guidewire, an 8 [place] 85 cm FlowGate balloon guide catheter which had been prepped with 50% contrast and 50% heparinized saline infusion was advanced and positioned in the distal left common carotid artery. The guidewire was  removed. Good aspiration was obtained from the hub of the 8 [place] FlowGate guide catheter. A gentle contrast injection demonstrated no evidence of spasms, dissections or of intraluminal filling defects. At this time, using biplane roadmap technique and constant fluoroscopic guidance, over a 0.035 inch Roadrunner guidewire, the 8 [place] FlowGate guide catheter was then advanced to the distal cervical left ICA without difficulty. Good aspiration was obtained after removal of the wire. A gentle control arteriogram demonstrated mild spasm with no impediment of distal flow. Patient was treated with 1 aliquots of 25 mics of nitroglycerin with relief of the vasospasm. At this time, in a coaxial manner and with constant heparinized saline infusion using biplane roadmap technique and constant fluoroscopic guidance, a Trevo ProVue 021 microcatheter was advanced over a 0.014 inch Softip Synchro micro guidewire to the distal end of the FlowGate guide catheter. With the micro guidewire leading with a J-tip configuration the combination was navigated to the supraclinoid right ICA. A torque device was then used to manipulate the guidewire into the left middle cerebral artery followed by the microcatheter. The micro guidewire was then advanced without difficulty into the M2 M3 region of the inferior division of left middle cerebral artery followed by the microcatheter. The guidewire was removed. Good aspiration was obtained from the hub of the microcatheter. A gentle contrast injection demonstrated antegrade flow of contrast. At this time, a 4 mm x 40 mm Solitaire FR retrieval device was advanced in a coaxial manner and with constant heparinized saline infusion using biplane roadmap technique and constant fluoroscopic guidance to the distal portion of the microcatheter. At this time the entire system was straightened by loosening the O ring on the delivery microcatheter. After having ascertained the distal and the proximal  positioning of the retrieval device, with slight forward gentle traction with the right hand on the delivery micro guidewire, with the left hand the microcatheter was retrieved unsheathing the entire Solitaire FR retrieval device with the tip of the microcatheter just proximal to the proximal landing zone of the retrieval device. A control arteriogram performed through the 8 [place] FlowGate guide catheter demonstrated significantly improved caliber and flow through the middle cerebral artery distribution with a narrowing of the retrieval device in the distal M1 segment. At this time, the balloon of the [facility] guide catheter was inflated in the distal left internal carotid artery for proximal flow arrest. The proximal portion of the retrieval device was captured into the microcatheter. There on after whilst aspirating with a 60 mL syringe at the hub of the [facility] guide catheter, the combination of the retrieval device and the microcatheter were gently retrieved and removed. The aspirate demonstrated prominent clot measuring approximately 2 mm x 4 mm. Aspiration was continued as the balloon was deflated in the left internal carotid artery. The FlowGate guide catheter was gently retrieved more proximally after deflation of the balloon. A control arteriogram  performed through the 8 [place] FlowGate guide catheter demonstrated complete angiographic revascularization of the left occluded left middle cerebral artery with patency of the left posterior cerebral and left anterior cerebral arteries. Focal segmental spasm was noted of the left middle cerebral artery involving the inferior division in the distal M1 region. This responded to 4 aliquots of 25 mics of nitroglycerin intra-arterially. A final control arteriogram performed demonstrated significant improved caliber of the previously noted vasospasm of the left middle cerebral artery, and also the left internal carotid artery mid cervical segment. Also noted of the  final to control arteriograms a focal area of hyper density in the left centrum semiovale region. No evidence of angiographic extravasation or mass effect was seen. Patient's hemodynamic status, blood pressure and heart rate remained stable throughout the procedure. The 8 [place] FlowGate guide catheter and the 8 [place] neurovascular sheath were then retrieved into the abdominal aorta and exchanged over a J-tip guidewire for an 8 French Pinnacle sheath which in turn was successfully removed with the application of a closure device. The right groin showed no evidence of a hematoma or hemorrhage. The distal pulses in both feet remained Dopplerable unchanged from prior to the procedure. IMPRESSION: Status post endovascular complete revascularization of occluded left middle cerebral artery with 1 pass with the Solitaire FR 4 mm x 40 mm retrieval device, with achievement of a TICI 3 reperfusion. Groin puncture to initial reperfusion TICI2b 32 minutes. Groin puncture to TICI reperfusion 38 minutes PLAN: The patient was transferred to the CT scanner for postprocedural CT scan of the brain. Electronically Signed   By: Luanne Bras M.D.   On: 11/29/2016 14:42   Ir Angio Intra Extracran Sel Com Carotid Innominate Uni L Mod Sed  Result Date: 11/30/2016 INDICATION: Patient presenting with aphasia and right-sided weakness with left gaze preference. 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 CBF < 30 % 5 ml,Tmax > 6 secs of 52 ml with a mismatch vol of 69ml. EXAM: 1. EMERGENT LARGE VESSEL OCCLUSION THROMBOLYSIS (anterior CIRCULATION) 2. COMPARISON:  CT angiogram of the head and neck, and CT perfusion study of 11/29/2016. MEDICATIONS: Vancomycin 1.5 mg IV was administered within 1 hour of the procedure. ANESTHESIA/SEDATION: General anesthesia. CONTRAST:  Isovue 300 approximately 75 cc. FLUOROSCOPY TIME:  Fluoroscopy Time: 28  minutes 36 seconds (1539 mGy). COMPLICATIONS: None immediate. TECHNIQUE: Following a full explanation of the procedure along with the potential associated complications, an informed witnessed consent was obtained. The risks of intracranial hemorrhage of 10%, worsening neurological deficit, ventilator dependency, death and inability to revascularize were all reviewed in detail with the patient's . The patient was then put under general anesthesia by the Department of Anesthesiology at [facility]. The right groin was prepped and draped in the usual sterile fashion. Thereafter using modified Seldinger technique, transfemoral access into the right common femoral artery was obtained without difficulty. Over a 0.035 inch guidewire a 5 French Pinnacle sheath was inserted. Through this, and also over a 0.035 inch guidewire a 5 [place] JB 1 catheter was advanced to the aortic arch region and selectively positioned in the right common carotid artery and the left common carotid artery, and the innominate artery. FINDINGS: The right subclavian arteriogram demonstrates the right vertebral artery to opacify normally to the cranial skull base. Flow is noted into the right vertebrobasilar junction and the right posterior-inferior cerebellar artery. Flash opacification of the  proximal basilar artery is also noted. The right common carotid arteriogram demonstrates the right external carotid artery and its major branches to be widely patent. The right internal carotid artery at the bulb to the cranial skull base opacifies widely. The petrous, the cavernous and the supraclinoid segments are widely patent. The right middle cerebral artery and the right anterior cerebral artery opacify into the capillary and venous phases. Flash opacification via the anterior communicating artery of the left anterior cerebral artery A2 segment is also noted. Also noted is contrast blush in the region of the middle terminate of the right maxillary  sinus most consistent with inflammatory reaction. The left common carotid arteriogram demonstrates the left external carotid artery and its major branches to be widely patent. The left internal carotid artery at the bulb to the cranial skull base opacifies widely. There are mild FMD-like changes noted in the mid left internal carotid artery mid cervical segment. The petrous, the cavernous and the supraclinoid segments demonstrate wide patency. A left posterior communicating artery is seen opacifying the left posterior cerebral artery distribution. The left anterior cerebral artery opacifies into the capillary and venous phases with flash filling via the anterior communicating artery of the right anterior cerebral artery A2 and A1 segments. The left middle cerebral artery demonstrates complete angiographic occlusion in its mid M1 segment with delayed opacification of an anterior temporal branch with prominent filling defects proximally. The M1 segment, otherwise, remains completely occluded. The delayed arterial phase in the lateral projection demonstrates partial retrograde opacification of the distal perisylvian branches with large area of hypoperfusion involving the lentiform nucleus and the caudate head. PROCEDURE: ENDOVASCULAR COMPLETE REVASCULARIZATION OF OCCLUDED LEFT MIDDLE CEREBRAL ARTERY M1 SEGMENT The diagnostic JB 1 catheter in the left common carotid artery was then exchanged over a 0.035 inch 300 cm Rosen exchange guidewire for an 8 French 55 cm Brite tip neurovascular sheath using biplane roadmap technique and constant fluoroscopic guidance. Good aspiration was obtained from the side port of the neurovascular sheath. This was then connected to continuous heparinized saline infusion. Over the [employer] guidewire, an 8 [place] 85 cm FlowGate balloon guide catheter which had been prepped with 50% contrast and 50% heparinized saline infusion was advanced and positioned in the distal left common carotid  artery. The guidewire was removed. Good aspiration was obtained from the hub of the 8 [place] FlowGate guide catheter. A gentle contrast injection demonstrated no evidence of spasms, dissections or of intraluminal filling defects. At this time, using biplane roadmap technique and constant fluoroscopic guidance, over a 0.035 inch Roadrunner guidewire, the 8 [place] FlowGate guide catheter was then advanced to the distal cervical left ICA without difficulty. Good aspiration was obtained after removal of the wire. A gentle control arteriogram demonstrated mild spasm with no impediment of distal flow. Patient was treated with 1 aliquots of 25 mics of nitroglycerin with relief of the vasospasm. At this time, in a coaxial manner and with constant heparinized saline infusion using biplane roadmap technique and constant fluoroscopic guidance, a Trevo ProVue 021 microcatheter was advanced over a 0.014 inch Softip Synchro micro guidewire to the distal end of the FlowGate guide catheter. With the micro guidewire leading with a J-tip configuration the combination was navigated to the supraclinoid right ICA. A torque device was then used to manipulate the guidewire into the left middle cerebral artery followed by the microcatheter. The micro guidewire was then advanced without difficulty into the M2 M3 region of the inferior division of left middle cerebral  artery followed by the microcatheter. The guidewire was removed. Good aspiration was obtained from the hub of the microcatheter. A gentle contrast injection demonstrated antegrade flow of contrast. At this time, a 4 mm x 40 mm Solitaire FR retrieval device was advanced in a coaxial manner and with constant heparinized saline infusion using biplane roadmap technique and constant fluoroscopic guidance to the distal portion of the microcatheter. At this time the entire system was straightened by loosening the O ring on the delivery microcatheter. After having ascertained the  distal and the proximal positioning of the retrieval device, with slight forward gentle traction with the right hand on the delivery micro guidewire, with the left hand the microcatheter was retrieved unsheathing the entire Solitaire FR retrieval device with the tip of the microcatheter just proximal to the proximal landing zone of the retrieval device. A control arteriogram performed through the 8 [place] FlowGate guide catheter demonstrated significantly improved caliber and flow through the middle cerebral artery distribution with a narrowing of the retrieval device in the distal M1 segment. At this time, the balloon of the [facility] guide catheter was inflated in the distal left internal carotid artery for proximal flow arrest. The proximal portion of the retrieval device was captured into the microcatheter. There on after whilst aspirating with a 60 mL syringe at the hub of the [facility] guide catheter, the combination of the retrieval device and the microcatheter were gently retrieved and removed. The aspirate demonstrated prominent clot measuring approximately 2 mm x 4 mm. Aspiration was continued as the balloon was deflated in the left internal carotid artery. The FlowGate guide catheter was gently retrieved more proximally after deflation of the balloon. A control arteriogram performed through the 8 [place] FlowGate guide catheter demonstrated complete angiographic revascularization of the left occluded left middle cerebral artery with patency of the left posterior cerebral and left anterior cerebral arteries. Focal segmental spasm was noted of the left middle cerebral artery involving the inferior division in the distal M1 region. This responded to 4 aliquots of 25 mics of nitroglycerin intra-arterially. A final control arteriogram performed demonstrated significant improved caliber of the previously noted vasospasm of the left middle cerebral artery, and also the left internal carotid artery mid cervical  segment. Also noted of the final to control arteriograms a focal area of hyper density in the left centrum semiovale region. No evidence of angiographic extravasation or mass effect was seen. Patient's hemodynamic status, blood pressure and heart rate remained stable throughout the procedure. The 8 [place] FlowGate guide catheter and the 8 [place] neurovascular sheath were then retrieved into the abdominal aorta and exchanged over a J-tip guidewire for an 8 French Pinnacle sheath which in turn was successfully removed with the application of a closure device. The right groin showed no evidence of a hematoma or hemorrhage. The distal pulses in both feet remained Dopplerable unchanged from prior to the procedure. IMPRESSION: Status post endovascular complete revascularization of occluded left middle cerebral artery with 1 pass with the Solitaire FR 4 mm x 40 mm retrieval device, with achievement of a TICI 3 reperfusion. Groin puncture to initial reperfusion TICI2b 32 minutes. Groin puncture to TICI reperfusion 38 minutes PLAN: The patient was transferred to the CT scanner for postprocedural CT scan of the brain. Electronically Signed   By: Luanne Bras M.D.   On: 11/29/2016 14:42   Ir Angio Vertebral Sel Subclavian Innominate Uni R Mod Sed  Result Date: 11/30/2016 INDICATION: Patient presenting with aphasia and right-sided weakness with left  gaze preference. 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 CBF < 30 % 5 ml,Tmax > 6 secs of 52 ml with a mismatch vol of 70ml. EXAM: 1. EMERGENT LARGE VESSEL OCCLUSION THROMBOLYSIS (anterior CIRCULATION) 2. COMPARISON:  CT angiogram of the head and neck, and CT perfusion study of 11/29/2016. MEDICATIONS: Vancomycin 1.5 mg IV was administered within 1 hour of the procedure. ANESTHESIA/SEDATION: General anesthesia. CONTRAST:  Isovue 300 approximately 75 cc. FLUOROSCOPY TIME:   Fluoroscopy Time: 28 minutes 36 seconds (1539 mGy). COMPLICATIONS: None immediate. TECHNIQUE: Following a full explanation of the procedure along with the potential associated complications, an informed witnessed consent was obtained. The risks of intracranial hemorrhage of 10%, worsening neurological deficit, ventilator dependency, death and inability to revascularize were all reviewed in detail with the patient's . The patient was then put under general anesthesia by the Department of Anesthesiology at [facility]. The right groin was prepped and draped in the usual sterile fashion. Thereafter using modified Seldinger technique, transfemoral access into the right common femoral artery was obtained without difficulty. Over a 0.035 inch guidewire a 5 French Pinnacle sheath was inserted. Through this, and also over a 0.035 inch guidewire a 5 [place] JB 1 catheter was advanced to the aortic arch region and selectively positioned in the right common carotid artery and the left common carotid artery, and the innominate artery. FINDINGS: The right subclavian arteriogram demonstrates the right vertebral artery to opacify normally to the cranial skull base. Flow is noted into the right vertebrobasilar junction and the right posterior-inferior cerebellar artery. Flash opacification of the proximal basilar artery is also noted. The right common carotid arteriogram demonstrates the right external carotid artery and its major branches to be widely patent. The right internal carotid artery at the bulb to the cranial skull base opacifies widely. The petrous, the cavernous and the supraclinoid segments are widely patent. The right middle cerebral artery and the right anterior cerebral artery opacify into the capillary and venous phases. Flash opacification via the anterior communicating artery of the left anterior cerebral artery A2 segment is also noted. Also noted is contrast blush in the region of the middle terminate  of the right maxillary sinus most consistent with inflammatory reaction. The left common carotid arteriogram demonstrates the left external carotid artery and its major branches to be widely patent. The left internal carotid artery at the bulb to the cranial skull base opacifies widely. There are mild FMD-like changes noted in the mid left internal carotid artery mid cervical segment. The petrous, the cavernous and the supraclinoid segments demonstrate wide patency. A left posterior communicating artery is seen opacifying the left posterior cerebral artery distribution. The left anterior cerebral artery opacifies into the capillary and venous phases with flash filling via the anterior communicating artery of the right anterior cerebral artery A2 and A1 segments. The left middle cerebral artery demonstrates complete angiographic occlusion in its mid M1 segment with delayed opacification of an anterior temporal branch with prominent filling defects proximally. The M1 segment, otherwise, remains completely occluded. The delayed arterial phase in the lateral projection demonstrates partial retrograde opacification of the distal perisylvian branches with large area of hypoperfusion involving the lentiform nucleus and the caudate head. PROCEDURE: ENDOVASCULAR COMPLETE REVASCULARIZATION OF OCCLUDED LEFT MIDDLE CEREBRAL ARTERY M1 SEGMENT The diagnostic JB 1 catheter in the left common carotid artery was then exchanged over a 0.035 inch 300 cm Constance Holster  exchange guidewire for an 8 [place] 55 cm Brite tip neurovascular sheath using biplane roadmap technique and constant fluoroscopic guidance. Good aspiration was obtained from the side port of the neurovascular sheath. This was then connected to continuous heparinized saline infusion. Over the [employer] guidewire, an 8 [place] 85 cm FlowGate balloon guide catheter which had been prepped with 50% contrast and 50% heparinized saline infusion was advanced and positioned in the  distal left common carotid artery. The guidewire was removed. Good aspiration was obtained from the hub of the 8 [place] FlowGate guide catheter. A gentle contrast injection demonstrated no evidence of spasms, dissections or of intraluminal filling defects. At this time, using biplane roadmap technique and constant fluoroscopic guidance, over a 0.035 inch Roadrunner guidewire, the 8 [place] FlowGate guide catheter was then advanced to the distal cervical left ICA without difficulty. Good aspiration was obtained after removal of the wire. A gentle control arteriogram demonstrated mild spasm with no impediment of distal flow. Patient was treated with 1 aliquots of 25 mics of nitroglycerin with relief of the vasospasm. At this time, in a coaxial manner and with constant heparinized saline infusion using biplane roadmap technique and constant fluoroscopic guidance, a Trevo ProVue 021 microcatheter was advanced over a 0.014 inch Softip Synchro micro guidewire to the distal end of the FlowGate guide catheter. With the micro guidewire leading with a J-tip configuration the combination was navigated to the supraclinoid right ICA. A torque device was then used to manipulate the guidewire into the left middle cerebral artery followed by the microcatheter. The micro guidewire was then advanced without difficulty into the M2 M3 region of the inferior division of left middle cerebral artery followed by the microcatheter. The guidewire was removed. Good aspiration was obtained from the hub of the microcatheter. A gentle contrast injection demonstrated antegrade flow of contrast. At this time, a 4 mm x 40 mm Solitaire FR retrieval device was advanced in a coaxial manner and with constant heparinized saline infusion using biplane roadmap technique and constant fluoroscopic guidance to the distal portion of the microcatheter. At this time the entire system was straightened by loosening the O ring on the delivery microcatheter. After  having ascertained the distal and the proximal positioning of the retrieval device, with slight forward gentle traction with the right hand on the delivery micro guidewire, with the left hand the microcatheter was retrieved unsheathing the entire Solitaire FR retrieval device with the tip of the microcatheter just proximal to the proximal landing zone of the retrieval device. A control arteriogram performed through the 8 [place] FlowGate guide catheter demonstrated significantly improved caliber and flow through the middle cerebral artery distribution with a narrowing of the retrieval device in the distal M1 segment. At this time, the balloon of the [facility] guide catheter was inflated in the distal left internal carotid artery for proximal flow arrest. The proximal portion of the retrieval device was captured into the microcatheter. There on after whilst aspirating with a 60 mL syringe at the hub of the [facility] guide catheter, the combination of the retrieval device and the microcatheter were gently retrieved and removed. The aspirate demonstrated prominent clot measuring approximately 2 mm x 4 mm. Aspiration was continued as the balloon was deflated in the left internal carotid artery. The FlowGate guide catheter was gently retrieved more proximally after deflation of the balloon. A control arteriogram performed through the 8 [place] FlowGate guide catheter demonstrated complete angiographic revascularization of the left occluded left middle cerebral artery with patency  of the left posterior cerebral and left anterior cerebral arteries. Focal segmental spasm was noted of the left middle cerebral artery involving the inferior division in the distal M1 region. This responded to 4 aliquots of 25 mics of nitroglycerin intra-arterially. A final control arteriogram performed demonstrated significant improved caliber of the previously noted vasospasm of the left middle cerebral artery, and also the left internal carotid  artery mid cervical segment. Also noted of the final to control arteriograms a focal area of hyper density in the left centrum semiovale region. No evidence of angiographic extravasation or mass effect was seen. Patient's hemodynamic status, blood pressure and heart rate remained stable throughout the procedure. The 8 [place] FlowGate guide catheter and the 8 [place] neurovascular sheath were then retrieved into the abdominal aorta and exchanged over a J-tip guidewire for an 8 French Pinnacle sheath which in turn was successfully removed with the application of a closure device. The right groin showed no evidence of a hematoma or hemorrhage. The distal pulses in both feet remained Dopplerable unchanged from prior to the procedure. IMPRESSION: Status post endovascular complete revascularization of occluded left middle cerebral artery with 1 pass with the Solitaire FR 4 mm x 40 mm retrieval device, with achievement of a TICI 3 reperfusion. Groin puncture to initial reperfusion TICI2b 32 minutes. Groin puncture to TICI reperfusion 38 minutes PLAN: The patient was transferred to the CT scanner for postprocedural CT scan of the brain. Electronically Signed   By: Luanne Bras M.D.   On: 11/29/2016 14:42    Micro Results    Recent Results (from the past 240 hour(s))  Culture, blood (routine x 2)     Status: None (Preliminary result)   Collection Time: 12/19/16  9:55 PM  Result Value Ref Range Status   Specimen Description BLOOD LEFT HAND  Final   Special Requests IN PEDIATRIC BOTTLE Blood Culture adequate volume  Final   Culture NO GROWTH 3 DAYS  Final   Report Status PENDING  Incomplete  Culture, blood (routine x 2)     Status: None (Preliminary result)   Collection Time: 12/19/16 10:00 PM  Result Value Ref Range Status   Specimen Description BLOOD RIGHT ANTECUBITAL  Final   Special Requests   Final    BOTTLES DRAWN AEROBIC AND ANAEROBIC Blood Culture adequate volume   Culture NO GROWTH 2 DAYS   Final   Report Status PENDING  Incomplete       Today   Subjective:   Ryana Montecalvo today has no headache,no chest or abdominal pain,no new weakness tingling or numbness, feels much better and eager to go to CIR . Objective:   Blood pressure (!) 137/108, pulse 95, temperature 98.4 F (36.9 C), temperature source Oral, resp. rate (!) 21, height 5\' 2"  (1.575 m), weight 76.8 kg (169 lb 6.4 oz), SpO2 97 %.   Intake/Output Summary (Last 24 hours) at 12/23/16 1313 Last data filed at 12/23/16 0400  Gross per 24 hour  Intake             2370 ml  Output              300 ml  Net             2070 ml    Exam Awake Alert, Oriented x 2, Supple Neck,No JVD,  Symmetrical Chest wall movement, Good air movement bilaterally, CTAB RRR,No Gallops,Rubs or new Murmurs, No Parasternal Heave +ve B.Sounds, Abd Soft, Non tender, No rebound -guarding or rigidity. No  Cyanosis, Clubbing or edema, No new Rash or bruise Neuro with mild dysarthria, and right hemiparesis  Data Review   CBC w Diff: Lab Results  Component Value Date   WBC 9.7 12/21/2016   HGB 10.5 (L) 12/21/2016   HCT 33.3 (L) 12/21/2016   PLT 257 12/21/2016   LYMPHOPCT 29 12/18/2016   MONOPCT 4 12/18/2016   EOSPCT 2 12/18/2016   BASOPCT 0 12/18/2016    CMP: Lab Results  Component Value Date   NA 141 12/22/2016   K 3.4 (L) 12/22/2016   CL 109 12/22/2016   CO2 22 12/22/2016   BUN 9 12/22/2016   CREATININE 0.90 12/22/2016   PROT 5.8 (L) 12/04/2016   ALBUMIN 3.0 (L) 12/04/2016   BILITOT 0.5 12/04/2016   ALKPHOS 48 12/04/2016   AST 23 12/04/2016   ALT 18 12/04/2016  .   Total Time in preparing paper work, data evaluation and todays exam - 35 minutes  Delaney Schnick M.D on 12/23/2016 at 1:13 PM  [facility]   Office  [phone]

## 2016-12-23 NOTE — Progress Notes (Signed)
Retta Diones, RN Rehab Admission Coordinator Signed Physical Medicine and Rehabilitation  PMR Pre-admission Date of Service: 12/23/2016 10:35 AM  Related encounter: Admission (Current) from 12/19/2016 in [place]       _0 Hide copied text PMR Admission Coordinator Pre-Admission Assessment  Patient: Valerie Mcclure is an 76 y.o., female MRN: [id number] DOB: [birth year] Height: _1  (157.5 cm) Weight: 76.8 kg (169 lb 6.4 oz)                                                                                                                                                  Insurance Information HMO: Yes   PPO:       PCP:       IPA:       80/20:       OTHER:  Group # [id number] PRIMARY: [place]      Policy#: [id number]      Subscriber:  patient CM Name: Vevelyn Royals      Phone#: [phone]     Fax#: [phone] Pre-Cert#: [id number]      Employer: Retired  Benefits:  Phone #: [phone]     Name:  Hanley Seamen. Date: 07/20/16     Deduct:  $0      Out of Pocket Max: $4400 (met $152.95)      Life Max: N/A CIR: $345 days 1-5      SNF: $0 days 1-20; $160 days 21-48; $0 days 49-100 Outpatient:  Medical necessity     Co-Pay: $40/visit Home Health: 100%      Co-Pay: none DME: 80%     Co-Pay: 20% Providers: in network  Medicaid Application Date:        Case Manager:   Disability Application Date:        Case Worker:    Emergency Tax adviser Information    Name Relation Home Work Secor B Spouse [phone]     [facility] Daughter   [phone]   Jim Like Daughter [phone]     [facility] Relative   [phone]     Current Medical History  Patient Admitting Diagnosis:  L MCA infarct, acute encephalopathy  History of Present Illness: A 76 y.o.femalewith history of chronic abdominal and LBP, CKD, hyperlipidemia who fell out of bed on 5/13 am and was found to have left facial weakness with  slurred speech and right sided weakness. CT head done revealing hyperdense L-MCA sign with hypodensity in left temporal area. CTA showed left M1 occlusion due to acute thrombus or embolus with significant penumbra left temporoparietal lobe and irregular non-calcified plaque L-ICA. She underwent cerebral angio with complete revascularization of L-MCA with reperfusion. Follow up CCT showed hemorrhagic transformation of left temporal infarct, large area of hemorrhage and extravasated contrast in left basal ganglia, minimal  SDH along tentorium and 6 mm midline shift to right. MRI brain L-MCA infarct with diffusion restriction left temporal lobe, superimposed 5.5 cm intra-axial hemorrhage centered at left basal ganglia with associated left hemisphere edema, effaced left lateral ventricle with trace IVH and occasional small foci of restriction in left thalamus and anterior left occipital lobe.   She was extubated 5/14 pm and Dr. Acie Fredrickson consulted for input on new onset A fib on telemetry. EKG without evidence of A fib. 2 D echo showed EF 55-60% with mild mitral regurg. He felt that patient with multifocal PACs and might be having bouts of A fib due to stress v/s sinus arrhthymias. Question event monitor v/s loop to evaluate for arrhthymias--to have 30 day monitor at discharge and loop recorder if monitor unrevealing. BLE ultrasound without DVT--right groin limited due to bandage. FEES done showing silent penetration with liquids--D1, honey by teaspoon recommended due to dysphagia. Dr. Erlinda Hong felt that stroke embolic due to unknown source and to start ASA on 5/24 for secondary stroke prevention. Speech evaluation revealed moderate dysarthria with expressive >receptive aphasia with phonemic paraphasias and anomia. Patient with resultant right sided weakness with right lean, right inattention, motor apraxia and DOE. CIR recommended for follow up therapy.   She was admitted to rehab on 12/03/16. On 12/19/16 she became  more somnolent and had periods of apnea.  These events responded to 0.65m IV narcan administration but re-administration (x3) was needed. She had a STAT CT head which showed no new acute activity and improved [place] personality change after narcan in that she was angry and agitated. Per nursing the rest of her neuro exam is unchanged. Her primary service requested transfer to the ICU for possible narcan gtt. The Narcan drip was subsequently weaned.  Patient developed a flutter.  She was started on metoprolol for heart rate control.  Neurology saw patient with recommendation of elequis as a flutter was likely the cause of her earlier embolic stroke. Patient will need close neuro monitoring, if acute neuro changes will need stat CT head to rule out ICH.  PT/OT/SLP re-evaluations were completed with recommendations to re admit to acute inpatient rehab for completion of her rehab program.  Patient to be re admitted today.   Total: 11=NIH  Past Medical History      Past Medical History:  Diagnosis Date  . Blood transfusion without reported diagnosis   . Chronic low back pain   . Hyperlipidemia   . Osteopenia   . Unspecified essential hypertension   . Unspecified hypothyroidism     Family History  family history includes Heart disease (age of onset: 67 in her father; Lung cancer (age of onset: 847 in her brother.  Prior Rehab/Hospitalizations: Was on CIR from 12/03/16 to 12/19/16.  Transferred back to acute hospital for acute encephalopathy.  Has the patient had major surgery during 100 days prior to admission? No  Current Medications   Current Facility-Administered Medications:  .  0.9 %  sodium chloride infusion, 250 mL, Intravenous, PRN, LRoswell Nickel MD, Stopped at 12/21/16 [phone].  0.9 %  sodium chloride infusion, , Intravenous, Continuous, Lo VCorine Shelter MD, Last Rate: 150 mL/hr at 12/23/16 0742 .  apixaban (ELIQUIS) tablet  5 mg, 5 mg, Oral, BID, Elgergawy, DSilver Huguenin MD .  FLUoxetine (PROZAC) capsule 20 mg, 20 mg, Oral, Daily, Lo VCorine Shelter MD, 20 mg at 12/23/16 1021 .  furosemide (LASIX) tablet 20 mg, 20  mg, Oral, Daily, Vickii Chafe Corine Shelter, MD, 20 mg at 12/23/16 1021 .  irbesartan (AVAPRO) tablet 150 mg, 150 mg, Oral, Daily, Lo Corine Shelter, MD, 150 mg at 12/23/16 1021 .  levothyroxine (SYNTHROID, LEVOTHROID) tablet 88 mcg, 88 mcg, Oral, QAC breakfast, Lo Corine Shelter, MD, 88 mcg at 12/23/16 0739 .  MEDLINE mouth rinse, 15 mL, Mouth Rinse, BID, Elgergawy, Silver Huguenin, MD, 15 mL at 12/23/16 1030 .  metoprolol tartrate (LOPRESSOR) tablet 25 mg, 25 mg, Oral, BID, Elgergawy, Silver Huguenin, MD, 25 mg at 12/23/16 1257 .  naloxone [facility]) injection 0.2 mg, 0.2 mg, Intravenous, PRN, Vickii Chafe Corine Shelter, MD, 0.2 mg at 12/19/16 2249 .  naloxone [facility]) injection 0.4 mg, 0.4 mg, Intravenous, PRN, Anders Simmonds, MD .  nortriptyline (PAMELOR) capsule 20 mg, 20 mg, Oral, QHS, [place] Nickel, MD, 20 mg at 12/22/16 2306 .  RESOURCE THICKENUP CLEAR, , Oral, PRN, Brand Males, MD .  simvastatin (ZOCOR) tablet 10 mg, 10 mg, Oral, q1800, Vickii Chafe Corine Shelter, MD, 10 mg at 12/22/16 1756  Patients Current Diet: DIET DYS 2 Room service appropriate? Yes with Assist; Fluid consistency: Nectar Thick  Precautions / Restrictions Precautions Precautions: Fall Precaution Comments: Rt hemiparesis Restrictions Weight Bearing Restrictions: No   Has the patient had 2 or more falls or a fall with injury in the past year?Yes.  Family reports about 7 falls with no injury.  Prior Activity Level Community (5-7x/wk): Went out daily, was driving.  Home Assistive Devices / Equipment Home Assistive Devices/Equipment: Kasandra Knudsen (specify quad or straight), Grab bars around toilet, Grab bars in shower, Environmental consultant (specify type), Scales, Wheelchair, Built-in shower seat, Reacher Home Equipment: Cane - single point  Prior Device Use: Indicate  devices/aids used by the patient prior to current illness, exacerbation or injury? Cane  Prior Functional Level Prior Function Level of Independence: Independent with assistive device(s) Comments: Has been at CIR prior to current admission. Prior to recent admit for CVA, patient occasionally used cane for ambulation.  Self Care: Did the patient need help bathing, dressing, using the toilet or eating?  Independent  Indoor Mobility: Did the patient need assistance with walking from room to room (with or without device)? Independent  Stairs: Did the patient need assistance with internal or external stairs (with or without device)? Dependent  Functional Cognition: Did the patient need help planning regular tasks such as shopping or remembering to take medications? Independent  Current Functional Level Cognition  Overall Cognitive Status: Impaired/Different from baseline Difficult to assess due to: Impaired communication Current Attention Level: Selective Orientation Level: Oriented to person, Oriented to place Following Commands: Follows one step commands consistently, Follows multi-step commands inconsistently Safety/Judgement: Decreased awareness of safety, Decreased awareness of deficits General Comments: Able to follow one-step commands consistently. Significant word finding deficits impacting ability to assess cognition. Able to tell me that she is in [place] when given multiple choice options.     Extremity Assessment (includes Sensation/Coordination)  Upper Extremity Assessment: RUE deficits/detail RUE Deficits / Details: Pain with PROM greater than 90 degrees in forward flexion and abduction. Decreased grasp and elbow flexion/extension strength to 3+/5 grossly. Shoulder AROM limited to 30 degrees in flexion and abduction.   Lower Extremity Assessment: Defer to PT evaluation RLE Deficits / Details: Strength grossly 3/5.  Decreased coordination.  RLE Coordination:  decreased gross motor    ADLs  Overall ADL's : Needs assistance/impaired Eating/Feeding: Minimal assistance, Sitting Grooming: Sitting, Moderate assistance, Applying deodorant, Wash/dry face, Wash/dry hands (and applying lotion) Grooming  Details (indicate cue type and reason): Mod assist to incorporate R UE into activity and for thoroughness when applying lotion and deoderant. Upper Body Bathing: Moderate assistance, Sitting Lower Body Bathing: Maximal assistance, Sitting/lateral leans Upper Body Dressing : Moderate assistance, Sitting Lower Body Dressing: Maximal assistance, Sitting/lateral leans General ADL Comments: Pt able to complete ADL seated at EOB. She demonstrates decreased dynamic sitting balance, ataxic movement, and poor motor planning as well as R hemiparesis impacting ability to complete ADL.    Mobility  Overal bed mobility: Needs Assistance Bed Mobility: Rolling, Sidelying to Sit, Sit to Sidelying Rolling: Min assist Sidelying to sit: Mod assist Sit to sidelying: Max assist General bed mobility comments: Verbal and tactile cues for sequencing. Multimodal cues for use of R UE.    Transfers  Overall transfer level: Needs assistance Equipment used: 2 person hand held assist Transfers: Sit to/from Stand Sit to Stand: Mod assist, +2 physical assistance General transfer comment: NT this session    Ambulation / Gait / Stairs / Wheelchair Mobility  Ambulation/Gait General Gait Details: NT    Posture / Balance Dynamic Sitting Balance Sitting balance - Comments: Min guard for EOB ADL. Balance Overall balance assessment: Needs assistance Sitting-balance support: Single extremity supported, Feet supported Sitting balance-Leahy Scale: Fair Sitting balance - Comments: Min guard for EOB ADL. Postural control: Posterior lean Standing balance support: Bilateral upper extremity supported Standing balance-Leahy Scale: Poor Standing balance comment: Posterior and Rt  lateral lean.    Special needs/care consideration BiPAP/CPAP No CPM No Continuous Drip IV 0.9% NS at 150 mL/hr Dialysis No       Life Vest No Oxygen No Special Bed No Trach Size No Wound Vac (area) No Skin No                            Bowel mgmt: Last BM 12/22/16 Bladder mgmt: External catheter, incontinence Diabetic mgmt No    Previous Home Environment Living Arrangements: Spouse/significant other, Children Available Help at Discharge: Family, Available 24 hours/day Type of Home: Apartment Home Layout: One level Home Access: Level entry Bathroom Shower/Tub: Tub/shower unit Home Care Services: No  Discharge Living Setting Plans for Discharge Living Setting: Lives with (comment), Apartment (Lives with husband, married for 20 years.) Type of Home at Discharge: Apartment Discharge Home Layout: One level Discharge Home Access: Level entry Does the patient have any problems obtaining your medications?: No  Social/Family/Support Systems Patient Roles: Spouse, Parent, Other (Comment) (Has husband, 2 dtrs, step dtr, grandson) Contact Information: "[name]" Raboin - husband - [phone] Anticipated Caregiver: Dtrs, step dtr, husband, family Anticipated Caregiver's Contact Information: [name] - dtr [phone] Ability/Limitations of Caregiver: Husband is retired and can assist.  Scientist, research (physical sciences) and step daughter can alternate and assist Caregiver Availability: 24/7 Discharge Plan Discussed with Primary Caregiver: Yes Is Caregiver In Agreement with Plan?: Yes Does Caregiver/Family have Issues with Lodging/Transportation while Pt is in Rehab?: No  Goals/Additional Needs Patient/Family Goal for Rehab: PT/OT/SLP supervision to min assist goals Expected length of stay: 7-10 days Cultural Considerations: [employer] Dietary Needs: Dys 2, nectar thick liquids Equipment Needs: TBD Pt/Family Agrees to Admission and willing to participate: Yes (Met with husband and daughter,  [name].) Program Orientation Provided & Reviewed with Pt/Caregiver Including Roles  & Responsibilities: Yes  Decrease burden of Care through IP rehab admission: N/A  Possible need for SNF placement upon discharge: No, family have refused NHP   Patient Condition: This patient's medical and functional  status has changed since the consult dated: Original consult completed 12/01/16 in which the Rehabilitation Physician determined and documented that the patient's condition is appropriate for intensive rehabilitative care in an inpatient rehabilitation facility. See "History of Present Illness" (above) for medical update. Functional changes are:  Currently requiring mod assist for transfers and mod to max assist for ADLs. Patient's medical and functional status update has been discussed with the Rehabilitation physician and patient remains appropriate for inpatient rehabilitation. Will admit to inpatient rehab today.  Preadmission Screen Completed By:  Retta Diones, 12/23/2016 2:14 PM ______________________________________________________________________   Discussed status with Dr. Posey Pronto on 12/23/16 at 1414 and received telephone approval for admission today.  Admission Coordinator:  Retta Diones, time 1414/Date 12/23/16       Cosigned by: Jamse Arn, MD at 12/23/2016 2:43 PM  Revision History

## 2016-12-23 NOTE — Clinical Social Work Note (Signed)
CSW consulted for "As back up plan for CIR; Skilled nursing facility placement." CSW met with pt and dtr-Valerie Mcclure at bedside. Pt and dtr refused SNF as CIR backup. Pt stated she would return home with HHPT, if not accepted back at CIR. CSW updated RNCM of SNF refusal.   CSW signing off as no further social work needs identified. Please reconsult if new social work needs arise.    , LCSWA, LCASA Clinical Social Work [phone] 

## 2016-12-23 NOTE — IPOC Note (Signed)
Overall Plan of Care [facility]) Patient Details Name: Valerie Mcclure MRN: [id number] DOB: [birth year]  Admitting Diagnosis: CVA  Hospital Problems: Active Problems:   Acute ischemic left MCA stroke ([place])   Acute on chronic diastolic heart failure ([name])   Acute blood loss anemia   Tachypnea   Hypoalbuminemia due to protein-calorie malnutrition ([place])   Global aphasia     Functional Problem List: Nursing Bladder, Bowel, Edema, Endurance, Medication Management, Pain, Safety, Skin Integrity, Motor  PT Balance, Endurance, Motor, Safety, Sensory, Edema  OT Balance, Cognition, Endurance, Motor, Perception, Safety, Sensory, Skin Integrity, Vision  SLP Linguistic, Nutrition, Cognition  TR         Basic ADL's: OT Eating, Grooming, Bathing, Dressing, Toileting     Advanced  ADL's: OT       Transfers: PT Bed Mobility, Car, Bed to Chair, Manufacturing systems engineer, Metallurgist: PT Ambulation, Emergency planning/management officer, Stairs     Additional Impairments: OT Fuctional Use of Upper Extremity  SLP Swallowing, Communication comprehension, expression    TR      Anticipated Outcomes Item Anticipated Outcome  Self Feeding Supervision/setup  Swallowing  Supervision    Basic self-care  Min assist - supervision  Toileting  Min assist   Bathroom Transfers Min assist  Bowel/Bladder  Continent without retention, urgency. No S/S infection; min assist  Transfers  min A  Locomotion  min A  Communication  Mod Assist   Cognition  Mod Assist   Pain  MAnaged at goal 2/10; non-narcotics  Safety/Judgment  Increased safety awareness; mod. assist   Therapy Plan: PT Intensity: Minimum of 1-2 x/day ,45 to 90 minutes PT Frequency: 5 out of 7 days PT Duration Estimated Length of Stay: 10-14 days OT Intensity: Minimum of 1-2 x/day, 45 to 90 minutes OT Frequency: 5 out of 7 days OT Duration/Estimated Length of Stay: 12-16 days SLP Intensity: Minumum of 1-2 x/day, 30 to 90  minutes SLP Frequency: 3 to 5 out of 7 days SLP Duration/Estimated Length of Stay: 10-14 days        Team Interventions: Nursing Interventions Patient/Family Education, Bladder Management, Bowel Management, Pain Management, Medication Management, Cognitive Remediation/Compensation, Dysphagia/Aspiration Precaution Training, Psychosocial Support, Discharge Planning, Skin Care/Wound Management, Disease Management/Prevention  PT interventions Ambulation/gait training, Cognitive remediation/compensation, DME/adaptive equipment instruction, Discharge planning, Functional mobility training, Pain management, Splinting/orthotics, Therapeutic Activities, UE/LE Strength taining/ROM, Wheelchair propulsion/positioning, Therapeutic Exercise, UE/LE Coordination activities, Stair training, Patient/family education, Neuromuscular re-education, Functional electrical stimulation, Community reintegration, Training and development officer  OT Interventions Training and development officer, Cognitive remediation/compensation, Academic librarian, Discharge planning, Disease mangement/prevention, Engineer, drilling, Functional electrical stimulation, Functional mobility training, Neuromuscular re-education, Pain management, Patient/family education, Psychosocial support, Self Care/advanced ADL retraining, Skin care/wound managment, Splinting/orthotics, Therapeutic Activities, Therapeutic Exercise, UE/LE Strength taining/ROM, UE/LE Coordination activities, Visual/perceptual remediation/compensation, Wheelchair propulsion/positioning  SLP Interventions Cognitive remediation/compensation, English as a second language teacher, Dysphagia/aspiration precaution training, Environmental controls, Functional tasks, Internal/external aids, Patient/family education, Speech/Language facilitation  TR Interventions    SW/CM Interventions Discharge Planning, Psychosocial Support, Patient/Family Education    Team Discharge Planning: Destination:  PT-Home ,OT- Home , SLP-Home Projected Follow-up: PT-Home health PT, Outpatient PT, OT-  Home health OT, 24 hour supervision/assistance, SLP-Home Health SLP, Outpatient SLP, 24 hour supervision/assistance Projected Equipment Needs: PT-To be determined, OT- 3 in 1 bedside comode, Tub/shower bench, SLP-To be determined Equipment Details: PT- , OT-  Patient/family involved in discharge planning: PT- Patient, Family member/caregiver,  OT-Patient, SLP-Patient  MD ELOS: 10-13 days. Medical Rehab Prognosis:  Good Assessment: 76 y.o.femalewith  history of chronic abdominal and LBP, CKD, hyperlipidemia who fell out of bed on 5/13 am and was found to have left facial weakness with slurred speech and right sided weakness. Work up revealed hyperdense L-MCA sign with left M1 occlusion due to acute thrombus or embolus with significant penumbra left temporoparietal lobe and irregular non-calcified plaque L-ICA. She underwent cerebral angio with complete revascularization of L-MCA with reperfusion with hemorrhagic transformation of left temporal infarct and minimal SDH. MRI brain revealed L-MCA infarct with diffusion restriction left temporal lobe, superimposed 5.5 cm intra-axial hemorrhage centered at left basal ganglia with associated left hemisphere edema, effaced left lateral ventricle with trace IVH and occasional small foci of restriction in left thalamus and anterior left occipital lobe. Dr. Erlinda Hong felt that stroke embolic due to unknown source and to start ASA on 5/24 for secondary stroke prevention. There was question of new onset A fib but Dr. Acie Fredrickson felt that patient with multifocal PACs and to have 30 day loop recorder placed at discharge. She was started on D1, honey by teaspoon due to dysphagia. Patient with resultant right sided weakness with right lean, right inattention, aphasia, motor apraxia and DOE. She was admitted to CIR on 12/03/16 and was maintained on ASA for secondary stroke prevention. She was  started on Bactrim for E coli UTI and renal status has been monitored serially and has been stable.  Blood pressures were noted to be labile and Avapro was titrated upwards.  Pain had been reasonable controlled on MS contin bid. Her diet was advanced to dysphagia 2, nectar liquids with water protocol on 5/22. Baclofen was added briefly to help manage spasticity but was discontinued due to sedation. Therapy has been focused on right inattention as well as weight bearing RLE. She has required cues for sequencing ADL tasks as well as mobility. She required min to mod assist with ADL tasks and mobility. She required supervision with cues to maintain aspiration precautions and functional communication was limited due to global aphasia with apraxia. On 6/2, she developed somnolence with hypoxia. She responded to multiple doses of narcan and stat CT head reviewed, showing improvement in midline shift. She developed agitation post narcan administration and required transfer to ICU. She was started on narcan drip for acute encephalopathy due to narcotic overdose. Mentation improved and narcan drip discontinued by 6/3. Work up negative and diet resumed. She developed atrial flutter with variable AV block and was started on metoprolol for rate control. Repeat CT 6/2 head showed near resolution of hematoma.  Dr. Erlinda Hong recommended resuming Eliquis with  close neuro monitoring and stat CT head prn acute changes to rule out ICH. Pt with resulting deficits with weakness, transfers, mobility, self-care.  Will set goals for Min A with PT/OT and Mod A with SLP.    See Team Conference Notes for weekly updates to the plan of care

## 2016-12-23 NOTE — H&P (Addendum)
Physical Medicine and Rehabilitation Admission H&P    CC: CVA with right sided weakness, aphasia,dysphagia   Valerie Mcclure a 76 y.o.femalewith history of chronic abdominal and LBP, CKD, hyperlipidemia who fell out of bed on 5/13 am and was found to have left facial weakness with slurred speech and right sided weakness. History taken form chart review and daughter. Work up revealed hyperdense L-MCA sign with left M1 occlusion due to acute thrombus or embolus with significant penumbra left temporoparietal lobe and irregular non-calcified plaque L-ICA. She underwent cerebral angio with complete revascularization of L-MCA with reperfusion with hemorrhagic transformation of left temporal infarct and minimal SDH. MRI brain revealed L-MCA infarct with diffusion restriction left temporal lobe, superimposed 5.5 cm intra-axial hemorrhage centered at left basal ganglia with associated left hemisphere edema, effaced left lateral ventricle with trace IVH and occasional small foci of restriction in left thalamus and anterior left occipital lobe. Dr. Erlinda Hong felt that stroke embolic due to unknown source and to start ASA on 5/24 for secondary stroke prevention. There was question of new onset A fib but Dr. Acie Fredrickson felt that patient with multifocal PACs and to have 30 day loop recorder placed at discharge. She was started on D1, honey by teaspoon due to dysphagia. Patient with resultant right sided weakness with right lean, right inattention, aphasia, motor apraxia and DOE. CIR was recommended for follow up therapy.   She was admitted to CIR on 12/03/16 and was maintained on ASA for secondary stroke prevention. She was started on Bactrim for E coli UTI and renal status has been monitored serially and has been stable.  Blood pressures were noted to be labile and Avapro was titrated upwards.  Pain has been reasonable controlled on MS contin bid. Her diet was advanced to dysphagia 2, nectar liquids with  water protocol on 5/22. Baclofen was added briefly to help manage spasticity but was discontinued due to sedation. Therapy has been focused on right inattention as well as weight bearing RLE. She has required cues for sequencing ADL tasks as well as mobility. She required min to mod assist with ADL tasks and mobility. She required supervision with cues to maintain aspiration precautions and functional communication was limited due to global aphasia with apraxia. On 6/2, she developed somnolence with hypoxia. She responded to multiple doses of narcan and stat CT head reviewed, showing improvement in midline shift. She developed agitation post narcan administration and required transfer to ICU. She was started on narcan drip for acute encephalopathy due to narcotic overdose. Mentation improved and narcan drip discontinued by 6/3. Work up negative and diet resumed. She developed atrial flutter with variable AV block and was started on metoprolol for rate control. Repeat CT 6/2 head showed near resolution of hematoma.  Dr. Erlinda Hong recommended resuming Eliquis with  close neuro monitoring and stat CT head prn acute changes to rule out ICH. Therapy resumed and patient was cleared to resume her rehab course.   Review of Systems  HENT: Negative for hearing loss and tinnitus.   Eyes: Negative for blurred vision.  Respiratory: Negative for cough and shortness of breath.   Cardiovascular: Negative for chest pain and palpitations.  Gastrointestinal: Negative for heartburn and nausea.  Genitourinary: Negative for dysuria and urgency.  Musculoskeletal: Negative for back pain and myalgias.  Skin: Negative for itching and rash.  Neurological: Positive for speech change and focal weakness. Negative for dizziness and headaches.  Psychiatric/Behavioral: Negative for depression. The patient is not nervous/anxious.   All  other systems reviewed and are negative.     Past Medical History:  Diagnosis Date  . Blood transfusion  without reported diagnosis   . Chronic low back pain   . Hyperlipidemia   . Osteopenia   . Unspecified essential hypertension   . Unspecified hypothyroidism     Past Surgical History:  Procedure Laterality Date  . ABDOMINAL HYSTERECTOMY  1970  . CHOLECYSTECTOMY  07/2009   Dr. Rise Patience  . COLONOSCOPY  2003  . FLEXIBLE SIGMOIDOSCOPY  2010  . HAND SURGERY    . IR ANGIO INTRA EXTRACRAN SEL COM CAROTID INNOMINATE UNI L MOD SED  11/29/2016  . IR ANGIO VERTEBRAL SEL SUBCLAVIAN INNOMINATE UNI R MOD SED  11/29/2016  . IR PERCUTANEOUS ART THROMBECTOMY/INFUSION INTRACRANIAL INC DIAG ANGIO  11/29/2016  . LUMBAR LAMINECTOMY  11/2008   Done by Dr. Patrice Paradise  . RADIOLOGY WITH ANESTHESIA N/A 11/29/2016   Procedure: RADIOLOGY WITH ANESTHESIA;  Surgeon: Radiologist, Medication, MD;  Location: [facility];  Service: Radiology;  Laterality: N/A;  . THYROIDECTOMY      Family History  Problem Relation Age of Onset  . Heart disease Father 22       MI age 22s  . Lung cancer Brother 22  . Colon cancer Neg Hx   . Esophageal cancer Neg Hx   . Rectal cancer Neg Hx   . Stomach cancer Neg Hx      Social History:  Married. 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 Her husband with recent MI.  Daughter lives with them and canassist as needed.  She reports that she has never smoked. She has never used smokeless tobacco. She reports that she does not drink alcohol or use drugs   Allergies  Allergen Reactions  . Shrimp [Shellfish Allergy]     Break outs and swelling   . Tandem Plus [Fefum-Fepo-Fa-B Cmp-C-Zn-Mn-Cu] Nausea And Vomiting  . Ivp Dye [Iodinated Diagnostic Agents] Rash    itching  . Penicillins Rash    itching    Medications Prior to Admission  Medication Sig Dispense Refill  . Calcium Carbonate-Vit D-Min (CALCIUM 1200 PO) Take by mouth daily.      . Cholecalciferol (VITAMIN D3) 1000 UNITS CAPS Take by mouth daily.      Marland Kitchen FLUoxetine (PROZAC) 20 MG capsule Take 1 capsule (20 mg total) by mouth daily.  (Patient taking differently: Take 40 mg by mouth daily. ) 30 capsule 11  . furosemide (LASIX) 20 MG tablet TAKE 1 TABLET BY MOUTH DAILY AS NEEDED 90 tablet 1  . levothyroxine (SYNTHROID, LEVOTHROID) 88 MCG tablet Take 1 tablet (88 mcg total) by mouth daily. 90 tablet 1  . metoprolol tartrate (LOPRESSOR) 25 MG tablet TAKE 1 TABLET(25 MG) BY MOUTH TWICE DAILY 180 tablet 2  . morphine (MS CONTIN) 15 MG 12 hr tablet Take 15 mg by mouth every 12 (twelve) hours.     Marland Kitchen NIFEdipine (PROCARDIA-XL/ADALAT-CC/NIFEDICAL-XL) 30 MG 24 hr tablet Take 30 mg by mouth daily.    . nortriptyline (PAMELOR) 10 MG capsule TAKE 2 CAPSULES BY MOUTH AT BEDTIME 180 capsule 1  . simvastatin (ZOCOR) 10 MG tablet TAKE 1 TABLET(10 MG) BY MOUTH DAILY 90 tablet 3  . valsartan (DIOVAN) 160 MG tablet Take 1 tablet (160 mg total) by mouth daily. 90 tablet 3  . vitamin B-12 (CYANOCOBALAMIN) 100 MCG tablet Take 100 mcg by mouth daily.      Home: Home Living Family/patient expects to be discharged to:: Private residence Living Arrangements: Spouse/significant other, Children  Available Help at Discharge: Family, Available 24 hours/day Type of Home: Apartment Home Access: Level entry Home Layout: One level Bathroom Shower/Tub: Tub/shower unit Home Equipment: Cane - single point   Functional History: Prior Function Level of Independence: Independent with assistive device(s) Comments: Has been at CIR prior to current admission. Prior to recent admit for CVA, patient occasionally used cane for ambulation.  Functional Status:  Mobility: Bed Mobility Overal bed mobility: Needs Assistance Bed Mobility: Rolling, Sidelying to Sit, Sit to Sidelying Rolling: Min assist Sidelying to sit: Mod assist Sit to sidelying: Max assist General bed mobility comments: Verbal and tactile cues for sequencing. Multimodal cues for use of R UE. Transfers Overall transfer level: Needs assistance Equipment used: 2 person hand held  assist Transfers: Sit to/from Stand Sit to Stand: Mod assist, +2 physical assistance General transfer comment: NT this session Ambulation/Gait General Gait Details: NT    ADL: ADL Overall ADL's : Needs assistance/impaired Eating/Feeding: Minimal assistance, Sitting Grooming: Sitting, Moderate assistance, Applying deodorant, Wash/dry face, Wash/dry hands (and applying lotion) Grooming Details (indicate cue type and reason): Mod assist to incorporate R UE into activity and for thoroughness when applying lotion and deoderant. Upper Body Bathing: Moderate assistance, Sitting Lower Body Bathing: Maximal assistance, Sitting/lateral leans Upper Body Dressing : Moderate assistance, Sitting Lower Body Dressing: Maximal assistance, Sitting/lateral leans General ADL Comments: Pt able to complete ADL seated at EOB. She demonstrates decreased dynamic sitting balance, ataxic movement, and poor motor planning as well as R hemiparesis impacting ability to complete ADL.  Cognition: Cognition Overall Cognitive Status: Impaired/Different from baseline Orientation Level: Oriented to person, Oriented to place Cognition Arousal/Alertness: Awake/alert Behavior During Therapy: WFL for tasks assessed/performed Overall Cognitive Status: Impaired/Different from baseline Area of Impairment: Attention, Following commands, Safety/judgement, Awareness, Problem solving Current Attention Level: Selective Following Commands: Follows one step commands consistently, Follows multi-step commands inconsistently Safety/Judgement: Decreased awareness of safety, Decreased awareness of deficits Awareness: Intellectual Problem Solving: Slow processing, Decreased initiation, Difficulty sequencing General Comments: Able to follow one-step commands consistently. Significant word finding deficits impacting ability to assess cognition. Able to tell me that she is in [place] when given multiple choice options.  Difficult to  assess due to: Impaired communication   Blood pressure (!) 137/108, pulse 95, temperature 98.4 F (36.9 C), temperature source Oral, resp. rate (!) 21, height '5\' 2"'$  (1.575 m), weight 76.8 kg (169 lb 6.4 oz), SpO2 97 %. Physical Exam  Nursing note and vitals reviewed. Constitutional: She is oriented to person, place, and time. She appears well-developed and well-nourished.  HENT:  Head: Normocephalic and atraumatic.  Eyes: EOM are normal. Pupils are equal, round, and reactive to light.  Neck: Normal range of motion. Neck supple.  Cardiovascular: Normal rate and regular rhythm.   Respiratory: Effort normal and breath sounds normal. No stridor. No respiratory distress. She has no wheezes.  GI: Soft. Bowel sounds are normal. She exhibits no distension. There is no tenderness.  Musculoskeletal: She exhibits no edema or tenderness.  Neurological: She is alert and oriented to person, place, and time.  Right facial weakness with expressive>> receptive aphasia. Able to follow simple motor commands.  Motor: RUE: 3-/5 proximal to distal RLE; 3+/5 proximal to distal LUE/LLE: 5/5  Skin: Skin is warm and dry.  Psychiatric: She has a normal mood and affect. Her behavior is normal.    Results for orders placed or performed during the hospital encounter of 12/19/16 (from the past 48 hour(s))  Basic metabolic panel  Status: Abnormal   Collection Time: 12/22/16  8:04 AM  Result Value Ref Range   Sodium 141 135 - 145 mmol/L   Potassium 3.4 (L) 3.5 - 5.1 mmol/L   Chloride 109 101 - 111 mmol/L   CO2 22 22 - 32 mmol/L   Glucose, Bld 114 (H) 65 - 99 mg/dL   BUN 9 6 - 20 mg/dL   Creatinine, Ser 0.90 0.44 - 1.00 mg/dL   Calcium 8.4 (L) 8.9 - 10.3 mg/dL   GFR calc non Af Amer >60 >60 mL/min   GFR calc Af Amer >60 >60 mL/min    Comment: (NOTE) The eGFR has been calculated using the CKD EPI equation. This calculation has not been validated in all clinical situations. eGFR's persistently <60 mL/min  signify possible Chronic Kidney Disease.    Anion gap 10 5 - 15   No results found.     Medical Problem List and Plan: 1.  Right hemiparesis, global aphasia secondary to left MCA infarct. 2.  DVT Prophylaxis/Anticoagulation: Pharmaceutical: Other (comment)--Eliquis. Monitor for any new neurological changes.  3. Pain Management: tylenol prn for now as denies any pain 4. Mood: No signs of distress--continue prozac. LCSW to follow for evaluation and support.  5. Neuropsych: This patient is not fully capable of making decisions on her own behalf. 6. Skin/Wound Care: routine pressure measures 7. Fluids/Electrolytes/Nutrition: Tolerating current diet. Monitor I/O check lytes in am. Continue water protocol in between meals.  8. AFib: Monitor HR bid--continue metoprolol and Eliquis 9. CKD: Baseline SCR 1.3-1.4. Improved with hydration--monitor for now.   10. Encephalopathy: Due to narcotic buildup resolved.  11. HTN: monitor BP bid. Continue Lasix, avapro and metoprolol.  12. Prediabetes: Educate on diet as cognition improves 13. Diastolic Heart failure: Monitor with daily weights. On lasix, metoprolol, avapro and Zocor  14. H/o anemia: Monitor H/H as now on Eliquis. Monitor for any signs of bleeding.   Post Admission Physician Evaluation: 1. Preadmission assessment reviewed and changes made below. 2. Functional deficits secondary  to left MCA infarct.. 3. Patient is admitted to receive collaborative, interdisciplinary care between the physiatrist, rehab nursing staff, and therapy team. 4. Patient's level of medical complexity and substantial therapy needs in context of that medical necessity cannot be provided at a lesser intensity of care such as a SNF. 5. Patient has experienced substantial functional loss from his/her baseline which was documented above under the "Functional History" and "Functional Status" headings.  Judging by the patient's diagnosis, physical exam, and functional  history, the patient has potential for functional progress which will result in measurable gains while on inpatient rehab.  These gains will be of substantial and practical use upon discharge  in facilitating mobility and self-care at the household level. 52. Physiatrist will provide 24 hour management of medical needs as well as oversight of the therapy plan/treatment and provide guidance as appropriate regarding the interaction of the two. 7. 24 hour rehab nursing will assist with bladder management, safety, disease management, medication administration and patient education  and help integrate therapy concepts, techniques,education, etc. 8. PT will assess and treat for/with: Lower extremity strength, range of motion, stamina, balance, functional mobility, safety, adaptive techniques and equipment, woundcare, coping skills, pain control, education.   Goals are: Min A. 9. OT will assess and treat for/with: ADL's, functional mobility, safety, upper extremity strength, adaptive techniques and equipment, wound mgt, ego support, and community reintegration.   Goals are: Min A. Therapy may proceed with showering this patient. 10. SLP  will assess and treat for/with: speech, langauge, swallowing, cognition.  Goals are: Min A. 11. Case Management and Social Worker will assess and treat for psychological issues and discharge planning. 12. Team conference will be held weekly to assess progress toward goals and to determine barriers to discharge. 13. Patient will receive at least 3 hours of therapy per day at least 5 days per week. 14. ELOS: 8-12 days.       15. Prognosis:  good  Delice Lesch, MD, [address], [place] 12/23/2016

## 2016-12-24 ENCOUNTER — Inpatient Hospital Stay (HOSPITAL_COMMUNITY): Payer: Medicare Other | Admitting: Occupational Therapy

## 2016-12-24 ENCOUNTER — Inpatient Hospital Stay (HOSPITAL_COMMUNITY): Payer: Medicare Other | Admitting: Speech Pathology

## 2016-12-24 ENCOUNTER — Inpatient Hospital Stay (HOSPITAL_COMMUNITY): Payer: Medicare Other | Admitting: Physical Therapy

## 2016-12-24 ENCOUNTER — Encounter (HOSPITAL_COMMUNITY): Payer: Self-pay

## 2016-12-24 DIAGNOSIS — E46 Unspecified protein-calorie malnutrition: Secondary | ICD-10-CM

## 2016-12-24 DIAGNOSIS — R4701 Aphasia: Secondary | ICD-10-CM

## 2016-12-24 DIAGNOSIS — E8809 Other disorders of plasma-protein metabolism, not elsewhere classified: Secondary | ICD-10-CM

## 2016-12-24 DIAGNOSIS — R4702 Dysphasia: Secondary | ICD-10-CM

## 2016-12-24 LAB — URINALYSIS, COMPLETE (UACMP) WITH MICROSCOPIC
Bilirubin Urine: NEGATIVE
Glucose, UA: NEGATIVE mg/dL
HGB URINE DIPSTICK: NEGATIVE
Ketones, ur: NEGATIVE mg/dL
NITRITE: POSITIVE — AB
PROTEIN: NEGATIVE mg/dL
SPECIFIC GRAVITY, URINE: 1.008 (ref 1.005–1.030)
pH: 7 (ref 5.0–8.0)

## 2016-12-24 LAB — CBC WITH DIFFERENTIAL/PLATELET
Basophils Absolute: 0 10*3/uL (ref 0.0–0.1)
Basophils Relative: 0 %
Eosinophils Absolute: 0.2 10*3/uL (ref 0.0–0.7)
Eosinophils Relative: 2 %
HEMATOCRIT: 33.8 % — AB (ref 36.0–46.0)
Hemoglobin: 10.8 g/dL — ABNORMAL LOW (ref 12.0–15.0)
LYMPHS PCT: 24 %
Lymphs Abs: 2.5 10*3/uL (ref 0.7–4.0)
MCH: 28.8 pg (ref 26.0–34.0)
MCHC: 32 g/dL (ref 30.0–36.0)
MCV: 90.1 fL (ref 78.0–100.0)
MONO ABS: 0.7 10*3/uL (ref 0.1–1.0)
MONOS PCT: 7 %
NEUTROS ABS: 6.9 10*3/uL (ref 1.7–7.7)
Neutrophils Relative %: 67 %
Platelets: 311 10*3/uL (ref 150–400)
RBC: 3.75 MIL/uL — ABNORMAL LOW (ref 3.87–5.11)
RDW: 14.6 % (ref 11.5–15.5)
WBC: 10.3 10*3/uL (ref 4.0–10.5)

## 2016-12-24 LAB — COMPREHENSIVE METABOLIC PANEL
ALT: 11 U/L — ABNORMAL LOW (ref 14–54)
AST: 17 U/L (ref 15–41)
Albumin: 3.2 g/dL — ABNORMAL LOW (ref 3.5–5.0)
Alkaline Phosphatase: 54 U/L (ref 38–126)
Anion gap: 11 (ref 5–15)
BILIRUBIN TOTAL: 0.7 mg/dL (ref 0.3–1.2)
BUN: 9 mg/dL (ref 6–20)
CO2: 25 mmol/L (ref 22–32)
Calcium: 8.5 mg/dL — ABNORMAL LOW (ref 8.9–10.3)
Chloride: 105 mmol/L (ref 101–111)
Creatinine, Ser: 1.09 mg/dL — ABNORMAL HIGH (ref 0.44–1.00)
GFR, EST AFRICAN AMERICAN: 55 mL/min — AB (ref >60)
GFR, EST NON AFRICAN AMERICAN: 48 mL/min — AB (ref >60)
Glucose, Bld: 115 mg/dL — ABNORMAL HIGH (ref 65–99)
POTASSIUM: 2.8 mmol/L — AB (ref 3.5–5.1)
Sodium: 141 mmol/L (ref 135–145)
TOTAL PROTEIN: 5.8 g/dL — AB (ref 6.5–8.1)

## 2016-12-24 LAB — CULTURE, BLOOD (ROUTINE X 2)
CULTURE: NO GROWTH
Special Requests: ADEQUATE

## 2016-12-24 MED ORDER — POTASSIUM CHLORIDE CRYS ER 10 MEQ PO TBCR
EXTENDED_RELEASE_TABLET | ORAL | Status: AC
  Filled 2016-12-24: qty 3

## 2016-12-24 MED ORDER — POTASSIUM CHLORIDE CRYS ER 20 MEQ PO TBCR
30.0000 meq | EXTENDED_RELEASE_TABLET | Freq: Two times a day (BID) | ORAL | Status: AC
  Administered 2016-12-24 – 2016-12-25 (×4): 30 meq via ORAL
  Filled 2016-12-24 (×3): qty 1

## 2016-12-24 MED ORDER — PRO-STAT SUGAR FREE PO LIQD
30.0000 mL | Freq: Two times a day (BID) | ORAL | Status: DC
  Administered 2016-12-26 – 2017-01-04 (×15): 30 mL via ORAL
  Filled 2016-12-24 (×22): qty 30

## 2016-12-24 NOTE — Evaluation (Signed)
Occupational Therapy Assessment and Plan  Patient Details  Name: Valerie Mcclure MRN: [id number] Date of Birth: 29-Jun-1941  OT Diagnosis: abnormal posture, cognitive deficits, disturbance of vision and hemiplegia affecting dominant side Rehab Potential: Rehab Potential (ACUTE ONLY): Good ELOS: 12-16 days   Today's Date: 12/24/2016 OT Individual Time: 0037-0488 OT Individual Time Calculation (min): 60 min     Problem List:  Patient Active Problem List   Diagnosis Date Noted  . Hypoalbuminemia due to protein-calorie malnutrition ([place])   . Global aphasia   . Encephalopathy 12/23/2016  . Acute on chronic diastolic heart failure ([place])   . Acute blood loss anemia   . Tachypnea   . Toxic encephalopathy 12/22/2016  . Atrial flutter ([place]) 12/22/2016  . Hypokalemia 12/21/2016  . Obesity (BMI 30-39.9) 12/21/2016  . Narcotic overdose 12/19/2016  . Spastic hemiplegia of right dominant side as late effect of cerebral infarction ([place])   . E-coli UTI   . Anemia of chronic disease   . Benign essential HTN   . Dysphagia, post-stroke   . Expressive aphasia   . Anterior cerebral circulation hemorrhagic infarction ([place]) 12/03/2016  . PAC (premature atrial contraction) 12/03/2016  . Acute ischemic left MCA stroke ([place]) 12/03/2016  . Aphasia as late effect of stroke 12/03/2016  . Right hemiparesis ([place])   . Nontraumatic subcortical hemorrhage of left cerebral hemisphere ([place])   . Acute embolic stroke ([place]) 89/16/9450  . Mild aortic regurgitation 08/20/2016  . Bilateral edema of lower extremity 04/07/2016  . Pancreatic duct dilated 01/09/2016  . CKD (chronic kidney disease) 12/25/2015  . Mild tricuspid regurgitation 12/25/2015  . Mild mitral regurgitation 12/25/2015  . Prediabetes 06/10/2015  . Edema 06/04/2015  . Abdominal pain, chronic, right upper quadrant 06/04/2015  . Postmenopausal disorder 04/03/2014  . Chronic low back pain   . PAIN IN THORACIC SPINE 04/18/2010  . Coronary  atherosclerosis 01/24/2009  . DEGENERATIVE JOINT DISEASE 01/23/2009  . ARTHRITIS, LEFT KNEE 12/13/2008  . HERNIATED LUMBAR DISC 12/13/2008  . Hypothyroidism 11/21/2007  . Hyperlipidemia 11/09/2007  . ANEMIA 11/09/2007  . Essential hypertension 11/09/2007  . Osteopenia 11/09/2007    Past Medical History:  Past Medical History:  Diagnosis Date  . Blood transfusion without reported diagnosis   . Chronic low back pain   . Hyperlipidemia   . Osteopenia   . Unspecified essential hypertension   . Unspecified hypothyroidism    Past Surgical History:  Past Surgical History:  Procedure Laterality Date  . ABDOMINAL HYSTERECTOMY  1970  . CHOLECYSTECTOMY  07/2009   Dr. Rise Patience  . COLONOSCOPY  2003  . FLEXIBLE SIGMOIDOSCOPY  2010  . HAND SURGERY    . IR ANGIO INTRA EXTRACRAN SEL COM CAROTID INNOMINATE UNI L MOD SED  11/29/2016  . IR ANGIO VERTEBRAL SEL SUBCLAVIAN INNOMINATE UNI R MOD SED  11/29/2016  . IR PERCUTANEOUS ART THROMBECTOMY/INFUSION INTRACRANIAL INC DIAG ANGIO  11/29/2016  . LUMBAR LAMINECTOMY  11/2008   Done by Dr. Patrice Paradise  . RADIOLOGY WITH ANESTHESIA N/A 11/29/2016   Procedure: RADIOLOGY WITH ANESTHESIA;  Surgeon: Radiologist, Medication, MD;  Location: [facility];  Service: Radiology;  Laterality: N/A;  . THYROIDECTOMY      Assessment & Plan Clinical Impression: Patient is a 76 y.o. right handed femalewith history of chronic abdominal and LBP, CKD, hyperlipidemia who fell out of bed on 5/13 am and was found to have left facial weakness with slurred speech and right sided weakness. History taken form chart review and daughter. Work up revealed hyperdense  L-MCA sign with left M1 occlusion due to acute thrombus or embolus with significant penumbra left temporoparietal lobe and irregular non-calcified plaque L-ICA. She underwent cerebral angio with complete revascularization of L-MCA with reperfusion with hemorrhagic transformation of left temporal infarct and minimal SDH. MRI brain  revealed L-MCA infarct with diffusion restriction left temporal lobe, superimposed 5.5 cm intra-axial hemorrhage centered at left basal ganglia with associated left hemisphere edema, effaced left lateral ventricle with trace IVH and occasional small foci of restriction in left thalamus and anterior left occipital lobe. Dr. Erlinda Hong felt that stroke embolic due to unknown source and to start ASA on 5/24 for secondary stroke prevention. There was question of new onset A fib but Dr. Acie Fredrickson felt that patient with multifocal PACs and to have 30 day loop recorder placed at discharge. She was started on D1, honey by teaspoon due to dysphagia. Patient with resultant right sided weakness with right lean, right inattention, aphasia, motor apraxia and DOE. CIR was recommended for follow up therapy.   She was admitted to CIR on 12/03/16 and was maintained on ASA for secondary stroke prevention. She was started on Bactrim for E coli UTI and renal status has been monitored serially and has been stable.  Blood pressures were noted to be labile and Avapro was titrated upwards.  Pain has been reasonable controlled on MS contin bid. Her diet was advanced to dysphagia 2, nectar liquids with water protocol on 5/22. Baclofen was added briefly to help manage spasticity but was discontinued due to sedation. Therapy has been focused on right inattention as well as weight bearing RLE. She has required cues for sequencing ADL tasks as well as mobility. She required min to mod assist with ADL tasks and mobility. She required supervision with cues to maintain aspiration precautions and functional communication was limited due to global aphasia with apraxia. On 6/2, she developed somnolence with hypoxia. She responded to multiple doses of narcan and stat CT head reviewed, showing improvement in midline shift. She developed agitation post narcan administration and required transfer to ICU. She was started on narcan drip for acute encephalopathy  due to narcotic overdose. Mentation improved and narcan drip discontinued by 6/3. Work up negative and diet resumed. She developed atrial flutter with variable AV block and was started on metoprolol for rate control. Repeat CT 6/2 head showed near resolution of hematoma.  Dr. Erlinda Hong recommended resuming Eliquis with  close neuro monitoring and stat CT head prn acute changes to rule out ICH. Therapy resumed and patient was cleared to resume her rehab course.  Patient transferred to CIR on 12/23/2016 .    Patient currently requires total with basic self-care skills secondary to muscle weakness, decreased cardiorespiratoy endurance, abnormal tone, decreased coordination and decreased motor planning, decreased visual perceptual skills, decreased attention to right, decreased awareness, decreased problem solving, decreased safety awareness, decreased memory and delayed processing and decreased sitting balance, decreased standing balance, decreased postural control, hemiplegia and decreased balance strategies.  Prior to hospitalization, patient could complete ADLs with independent .  Patient will benefit from skilled intervention to decrease level of assist with basic self-care skills prior to discharge home with care partner.  Anticipate patient will require intermittent supervision and minimal physical assistance and follow up home health.  OT - End of Session Activity Tolerance: Tolerates 30+ min activity with multiple rests Endurance Deficit: Yes Endurance Deficit Description: required multiple rest breaks OT Assessment Rehab Potential (ACUTE ONLY): Good OT Patient demonstrates impairments in the following area(s): Balance;Cognition;Endurance;Motor;Perception;Safety;Sensory;Skin  Integrity;Vision OT Basic ADL's Functional Problem(s): Eating;Grooming;Bathing;Dressing;Toileting OT Transfers Functional Problem(s): Toilet;Tub/Shower OT Additional Impairment(s): Fuctional Use of Upper Extremity OT Plan OT  Intensity: Minimum of 1-2 x/day, 45 to 90 minutes OT Frequency: 5 out of 7 days OT Duration/Estimated Length of Stay: 12-16 days OT Treatment/Interventions: Balance/vestibular training;Cognitive remediation/compensation;Community reintegration;Discharge planning;Disease mangement/prevention;DME/adaptive equipment instruction;Functional electrical stimulation;Functional mobility training;Neuromuscular re-education;Pain management;Patient/family education;Psychosocial support;Self Care/advanced ADL retraining;Skin care/wound managment;Splinting/orthotics;Therapeutic Activities;Therapeutic Exercise;UE/LE Strength taining/ROM;UE/LE Coordination activities;Visual/perceptual remediation/compensation;Wheelchair propulsion/positioning OT Self Feeding Anticipated Outcome(s): Supervision/setup OT Basic Self-Care Anticipated Outcome(s): Min assist - supervision OT Toileting Anticipated Outcome(s): Min assist OT Bathroom Transfers Anticipated Outcome(s): Min assist OT Recommendation Recommendations for Other Services: Therapeutic Recreation consult Therapeutic Recreation Interventions: Pet therapy;Stress management Patient destination: Home Follow Up Recommendations: Home health OT;24 hour supervision/assistance Equipment Recommended: 3 in 1 bedside comode;Tub/shower bench   Skilled Therapeutic Intervention OT eval completed with discussion regarding rehab process, OT purpose, POC, ELOS, and goals.  ADL assessment completed with bathing at sit > stand level at sink.  Pt required max assist stand pivot transfer bed > w/c with increased posterior lean and decreased ability to weight shift anteriorly.  Required increased cues for orientation and sequencing of clothing during dressing tasks (compared to sessions just prior to transfer back to acute).  Pt with difficulty with expression when attempting to direct therapist to locate something in dresser and closet, unable to decipher what pt was seeking.  Max assist  for sit > stand with LB hygiene and dressing due to posterior lean.  Noted Rt inattention during bathing and dressing as pt draping clothing over Rt arm but not threading through arm hole.  Left upright with all items in reach and quick release belt on.  OT Evaluation Precautions/Restrictions  Precautions Precautions: Fall Precaution Comments: Rt hemiparesis, Rt inattention General   Vital Signs Therapy Vitals Temp: 98 F (36.7 C) Temp Source: Oral Pulse Rate: 77 Resp: 18 BP: 140/80 Patient Position (if appropriate): Lying Oxygen Therapy SpO2: 98 % O2 Device: Not Delivered Pain Pain Assessment Pain Assessment: No/denies pain Home Living/Prior Functioning Home Living Family/patient expects to be discharged to:: Private residence Living Arrangements: Spouse/significant other, Children Available Help at Discharge: Family, Available 24 hours/day Type of Home: Apartment Home Access: Level entry Home Layout: One level Bathroom Shower/Tub: Tub/shower unit Additional Comments: info for eval taken from previous CIR eval as well as yes/no questions as no family present during eval  Lives With: Spouse, Daughter IADL History Homemaking Responsibilities: Yes Meal Prep Responsibility: Therapist, occupational Responsibility: Primary Cleaning Responsibility: Primary Prior Function Level of Independence: Independent with basic ADLs, Requires assistive device for independence, Independent with transfers, Independent with homemaking with ambulation  Able to Take Stairs?: Yes Driving: Yes Comments: Prior to recent admit for CVA, patient occasionally used cane for ambulation. ADL  See Function Navigator Vision Baseline Vision/History: Wears glasses Wears Glasses: Reading only Patient Visual Report: No change from baseline Vision Assessment?: Vision impaired- to be further tested in functional context Additional Comments: Pt with difficutly sequencing and following directions to complete visual  assessment.  Pt able to track into all visual fields, however with Rt inattention to body and environment during self-care tasks Perception  Perception: Impaired Inattention/Neglect: Does not attend to right visual field;Does not attend to right side of body Cognition Overall Cognitive Status: Impaired/Different from baseline Arousal/Alertness: Awake/alert Orientation Level: Person (difficult to assess secondary to aphasia) Year:  (19...Marland Kitchen) Month: May Day of Week: Correct Memory: Impaired Immediate Memory Recall: Bed (blue with choice of 3) Memory  Recall:  (difficult to assess secondary to aphasia) Attention: Selective Selective Attention: Appears intact Awareness: Impaired Awareness Impairment: Emergent impairment Problem Solving: Impaired Problem Solving Impairment: Functional basic Safety/Judgment: Impaired Sensation Sensation Light Touch: Impaired Detail Light Touch Impaired Details: Impaired RLE;Impaired RUE Hot/Cold: Impaired by gross assessment (RUE) Proprioception: Impaired Detail Proprioception Impaired Details: Impaired RLE;Impaired RUE Coordination Gross Motor Movements are Fluid and Coordinated: No Fine Motor Movements are Fluid and Coordinated: No Coordination and Movement Description: Rt hemiplegia Motor  Motor Motor: Abnormal postural alignment and control;Hemiplegia Motor - Skilled Clinical Observations: Rt hemiplegia  Trunk/Postural Assessment  Cervical Assessment Cervical Assessment: Within Functional Limits Thoracic Assessment Thoracic Assessment:  (mild kyphosis) Lumbar Assessment Lumbar Assessment:  (posterior pelvic tilt) Postural Control Postural Control: Deficits on evaluation Righting Reactions: delayed  Balance Dynamic Sitting Balance Sitting balance - Comments: min guard for reaching tasks Dynamic Standing Balance Dynamic Standing - Comments: total A for standing balance without UE support Extremity/Trunk Assessment RUE Assessment RUE  Assessment: Exceptions to [facility] (shoulder flexion grossly 80 degrees against gravity, elbow flexion/extension in gravity elminated, and loose gross grasp.) LUE Assessment LUE Assessment: Within Functional Limits   See Function Navigator for Current Functional Status.   Refer to Care Plan for Long Term Goals  Recommendations for other services: Neuropsych and Therapeutic Recreation  Pet therapy and Stress management   Discharge Criteria: Patient will be discharged from OT if patient refuses treatment 3 consecutive times without medical reason, if treatment goals not met, if there is a change in medical status, if patient makes no progress towards goals or if patient is discharged from hospital.  The above assessment, treatment plan, treatment alternatives and goals were discussed and mutually agreed upon: by patient  Simonne Come 12/24/2016, 3:41 PM

## 2016-12-24 NOTE — Progress Notes (Signed)
PHYSICAL MEDICINE & REHABILITATION     PROGRESS NOTE  Subjective/Complaints:  Pt seen laying in bed this AM.  She states she slept well overnight and is ready to resume therapies.   ROS: Denies CP, SOB, N/V/D.  Objective: Vital Signs: Blood pressure 140/80, pulse 77, temperature 98 F (36.7 C), temperature source Oral, resp. rate 18, height 5\' 3"  (1.6 m), weight 77.8 kg (171 lb 8.3 oz), SpO2 98 %. No results found.  Recent Labs  12/24/16 0556  WBC 10.3  HGB 10.8*  HCT 33.8*  PLT 311    Recent Labs  12/22/16 0804 12/24/16 0556  NA 141 141  K 3.4* 2.8*  CL 109 105  GLUCOSE 114* 115*  BUN 9 9  CREATININE 0.90 1.09*  CALCIUM 8.4* 8.5*   CBG (last 3)  No results for input(s): GLUCAP in the last 72 hours.  Wt Readings from Last 3 Encounters:  12/24/16 77.8 kg (171 lb 8.3 oz)  12/23/16 76.8 kg (169 lb 6.4 oz)  12/16/16 72.6 kg (160 lb)    Physical Exam:  BP 140/80 (BP Location: Right Arm)   Pulse 77   Temp 98 F (36.7 C) (Oral)   Resp 18   Ht 5\' 3"  (1.6 m)   Wt 77.8 kg (171 lb 8.3 oz)   SpO2 98%   BMI 30.38 kg/m  Constitutional: She appears well-developed and well-nourished. NAD. HENT: Normocephalic and atraumatic.  Eyes: EOMI. No discharge.  Cardiovascular: Normal rate and regular rhythm.  No JVD. Respiratory: Effort normal and breath sounds normal.  GI: Soft. Bowel sounds are normal.  Musculoskeletal: She exhibits no edema or tenderness.  Neurological: She is alert and oriented.  Right facial weakness with expressive>> receptive aphasia. Able to follow simple motor commands.  Motor: RUE: 3-/5 proximal to distal RLE; 3+/5 proximal to distal LUE/LLE: 5/5  Skin: Skin is warm and dry.  Psychiatric: She has a normal mood and affect. Her behavior is normal.   Assessment/Plan: 1. Functional deficits secondary to left MCA infarct which require 3+ hours per day of interdisciplinary therapy in a comprehensive inpatient rehab setting. Physiatrist is  providing close team supervision and 24 hour management of active medical problems listed below. Physiatrist and rehab team continue to assess barriers to discharge/monitor patient progress toward functional and medical goals.  Function:  Bathing Bathing position      Bathing parts      Bathing assist        Upper Body Dressing/Undressing Upper body dressing                    Upper body assist        Lower Body Dressing/Undressing Lower body dressing                                  Lower body assist        Toileting Toileting          Toileting assist     Transfers Chair/bed transfer             Locomotion Ambulation           Wheelchair          Cognition Comprehension Comprehension assist level: Understands basic 75 - 89% of the time/ requires cueing 10 - 24% of the time  Expression Expression assist level: Expresses basic 25 - 49% of the time/requires cueing 50 -  75% of the time. Uses single words/gestures.  Social Interaction Social Interaction assist level: Interacts appropriately 75 - 89% of the time - Needs redirection for appropriate language or to initiate interaction.  Problem Solving Problem solving assist level: Solves basic 25 - 49% of the time - needs direction more than half the time to initiate, plan or complete simple activities  Memory Memory assist level: Recognizes or recalls 25 - 49% of the time/requires cueing 50 - 75% of the time    Medical Problem List and Plan: 1.  Right hemiparesis, global aphasia secondary to left MCA infarct.  Begin CIR 2.  DVT Prophylaxis/Anticoagulation: Pharmaceutical: Other (comment)--Eliquis. Monitor for any new neurological changes.  3. Pain Management: tylenol prn for now 4. Mood: No signs of distress--continue prozac. LCSW to follow for evaluation and support.  5. Neuropsych: This patient is not fully capable of making decisions on her own behalf. 6. Skin/Wound Care: routine  pressure measures 7. Fluids/Electrolytes/Nutrition: Monitor I/Os.   D2 nectar, continue water protocol in between meals.  8. AFib: Monitor HR bid--continue metoprolol and Eliquis 9. CKD: ?Baseline SCR 1.3-1.4. Improved with hydration  Cr 1.09 on 6/7  Cont to monitor.   10. Encephalopathy: Resolved. Due to narcotic accumulation. 11. HTN: monitor BP bid. Continue Lasix, avapro and metoprolol.   Monitor with increased mobility 12. Prediabetes: Educate on diet as cognition improves  Monitor with increased activity 13. Diastolic Heart failure: On lasix, metoprolol, avapro and Zocor  Filed Weights   12/23/16 1930 12/24/16 0532  Weight: 77.2 kg (170 lb 3.1 oz) 77.8 kg (171 lb 8.3 oz)  14. H/o anemia: Monitor H/H as now on Eliquis. Monitor for any signs of bleeding.   Hb 10.8 on 6/7  Cont to monitor 15. Hypoalbuminemia  Supplement initiated 6/7 16. Hypokalemia  Severely low at K+ 2.8  Supplementing x4 days  LOS (Days) 1 A FACE TO FACE EVALUATION WAS PERFORMED  Adyen Bifulco Lorie Phenix 12/24/2016 8:54 AM

## 2016-12-24 NOTE — Evaluation (Signed)
Physical Therapy Assessment and Plan  Patient Details  Name: Valerie Mcclure MRN: [id number] Date of Birth: 02/13/1941  PT Diagnosis: Abnormality of gait, Cognitive deficits, Coordination disorder, Difficulty walking, Hemiplegia dominant and Muscle weakness Rehab Potential: Good ELOS: 10-14 days   Today's Date: 12/24/2016 PT Individual Time: 1300-1410 PT Individual Time Calculation (min): 70 min    Problem List:  Patient Active Problem List   Diagnosis Date Noted  . Hypoalbuminemia due to protein-calorie malnutrition ([place])   . Global aphasia   . Encephalopathy 12/23/2016  . Acute on chronic diastolic heart failure ([place])   . Acute blood loss anemia   . Tachypnea   . Toxic encephalopathy 12/22/2016  . Atrial flutter ([place]) 12/22/2016  . Hypokalemia 12/21/2016  . Obesity (BMI 30-39.9) 12/21/2016  . Narcotic overdose 12/19/2016  . Spastic hemiplegia of right dominant side as late effect of cerebral infarction ([place])   . E-coli UTI   . Anemia of chronic disease   . Benign essential HTN   . Dysphagia, post-stroke   . Expressive aphasia   . Anterior cerebral circulation hemorrhagic infarction ([place]) 12/03/2016  . PAC (premature atrial contraction) 12/03/2016  . Acute ischemic left MCA stroke ([place]) 12/03/2016  . Aphasia as late effect of stroke 12/03/2016  . Right hemiparesis ([place])   . Nontraumatic subcortical hemorrhage of left cerebral hemisphere ([place])   . Acute embolic stroke ([place]) 58/03/9832  . Mild aortic regurgitation 08/20/2016  . Bilateral edema of lower extremity 04/07/2016  . Pancreatic duct dilated 01/09/2016  . CKD (chronic kidney disease) 12/25/2015  . Mild tricuspid regurgitation 12/25/2015  . Mild mitral regurgitation 12/25/2015  . Prediabetes 06/10/2015  . Edema 06/04/2015  . Abdominal pain, chronic, right upper quadrant 06/04/2015  . Postmenopausal disorder 04/03/2014  . Chronic low back pain   . PAIN IN THORACIC SPINE 04/18/2010  . Coronary atherosclerosis  01/24/2009  . DEGENERATIVE JOINT DISEASE 01/23/2009  . ARTHRITIS, LEFT KNEE 12/13/2008  . HERNIATED LUMBAR DISC 12/13/2008  . Hypothyroidism 11/21/2007  . Hyperlipidemia 11/09/2007  . ANEMIA 11/09/2007  . Essential hypertension 11/09/2007  . Osteopenia 11/09/2007    Past Medical History:  Past Medical History:  Diagnosis Date  . Blood transfusion without reported diagnosis   . Chronic low back pain   . Hyperlipidemia   . Osteopenia   . Unspecified essential hypertension   . Unspecified hypothyroidism    Past Surgical History:  Past Surgical History:  Procedure Laterality Date  . ABDOMINAL HYSTERECTOMY  1970  . CHOLECYSTECTOMY  07/2009   Dr. Rise Patience  . COLONOSCOPY  2003  . FLEXIBLE SIGMOIDOSCOPY  2010  . HAND SURGERY    . IR ANGIO INTRA EXTRACRAN SEL COM CAROTID INNOMINATE UNI L MOD SED  11/29/2016  . IR ANGIO VERTEBRAL SEL SUBCLAVIAN INNOMINATE UNI R MOD SED  11/29/2016  . IR PERCUTANEOUS ART THROMBECTOMY/INFUSION INTRACRANIAL INC DIAG ANGIO  11/29/2016  . LUMBAR LAMINECTOMY  11/2008   Done by Dr. Patrice Paradise  . RADIOLOGY WITH ANESTHESIA N/A 11/29/2016   Procedure: RADIOLOGY WITH ANESTHESIA;  Surgeon: Radiologist, Medication, MD;  Location: [facility];  Service: Radiology;  Laterality: N/A;  . THYROIDECTOMY      Assessment & Plan Clinical Impression: Patient is a 76 y.o. year old female with recentadmitted to CIR on 12/03/16 and was maintained on ASA for secondary stroke prevention. She was started on Bactrim for E coli UTI and renal status has been monitored serially and has been stable.  Blood pressures were noted to be labile and Avapro  was titrated upwards.  Pain has been reasonable controlled on MS contin bid. Her diet was advanced to dysphagia 2, nectar liquids with water protocol on 5/22. Baclofen was added briefly to help manage spasticity but was discontinued due to sedation. Therapy has been focused on right inattention as well as weight bearing RLE. She has required cues for  sequencing ADL tasks as well as mobility. She required min to mod assist with ADL tasks and mobility. She required supervision with cues to maintain aspiration precautions and functional communication was limited due to global aphasia with apraxia. On 6/2, she developed somnolence with hypoxia. She responded to multiple doses of narcan and stat CT head reviewed, showing improvement in midline shift. She developed agitation post narcan administration and required transfer to ICU. She was started on narcan drip for acute encephalopathy due to narcotic overdose. Mentation improved and narcan drip discontinued by 6/3. Work up negative and diet resumed. She developed atrial flutter with variable AV block and was started on metoprolol for rate control. Repeat CT 6/2 head showed near resolution of hematoma.  Dr. Erlinda Hong recommended resuming Eliquis with  close neuro monitoring and stat CT head prn acute changes to rule out ICH.   Patient transferred to CIR on 12/23/2016 .   Patient currently requires max with mobility secondary to muscle weakness, decreased cardiorespiratoy endurance, abnormal tone, unbalanced muscle activation, motor apraxia and decreased coordination, decreased awareness, decreased problem solving and decreased safety awareness and decreased sitting balance, decreased standing balance, decreased postural control, hemiplegia and decreased balance strategies.  Prior to hospitalization, patient was modified independent  with mobility and lived with Spouse in a [place] home.  Home access is  Level entry.  Patient will benefit from skilled PT intervention to maximize safe functional mobility, minimize fall risk and decrease caregiver burden for planned discharge home with 24 hour assist.  Anticipate patient will benefit from follow up [facility] at discharge.  PT - End of Session Activity Tolerance: Tolerates 30+ min activity with multiple rests Endurance Deficit: Yes Endurance Deficit Description: required  multiple rest breaks PT Assessment Rehab Potential (ACUTE/IP ONLY): Good PT Patient demonstrates impairments in the following area(s): Balance;Endurance;Motor;Safety;Sensory;Edema PT Transfers Functional Problem(s): Bed Mobility;Car;Bed to Chair;Furniture PT Locomotion Functional Problem(s): Ambulation;Wheelchair Mobility;Stairs PT Plan PT Intensity: Minimum of 1-2 x/day ,45 to 90 minutes PT Frequency: 5 out of 7 days PT Duration Estimated Length of Stay: 10-14 days PT Treatment/Interventions: Ambulation/gait training;Cognitive remediation/compensation;DME/adaptive equipment instruction;Discharge planning;Functional mobility training;Pain management;Splinting/orthotics;Therapeutic Activities;UE/LE Strength taining/ROM;Wheelchair propulsion/positioning;Therapeutic Exercise;UE/LE Coordination activities;Stair training;Patient/family education;Neuromuscular re-education;Functional electrical stimulation;Community reintegration;Balance/vestibular training PT Transfers Anticipated Outcome(s): min A PT Locomotion Anticipated Outcome(s): min A PT Recommendation Recommendations for Other Services: Therapeutic Recreation consult Follow Up Recommendations: Home health PT;Outpatient PT Patient destination: Home Equipment Recommended: To be determined  Skilled Therapeutic Intervention Pt participated in skilled PT eval and pt and family were educated on PT POC and goals, all express understanding.  Pt performed transfers with max A with increased posterior lean and max cues to find balance and center.  Gait initially with +2 assist for safety with mild ataxia due to increased tone in Rt LE.  Gait with SBQC 2 x 40' with mod A for balance due to Rt lean, max A for balance with turns.  Stair negotiation with 1 handrail with max A, max cues for sequencing and safety.  Car transfer to simulated sedan with max A for balance and safety.  Sit to stand blocked practice with focus on anterior wt shift with initial max  fading to mod A.  nustep for NMR and coordination x 6 minutes with bilat LEs.  Pt fatigues quickly but does well with frequent rest breaks.  Pt still with difficulty with language, unable to repeat words or answer simple yes/no questions, pt is able to speak automatic phrases without cues.  PT Evaluation Precautions/Restrictions Precautions Precautions: Fall Precaution Comments: Rt hemiparesis, Rt inattention Restrictions Weight Bearing Restrictions: No Pain Pain Assessment Pain Assessment: No/denies pain Home Living/Prior Functioning Home Living Available Help at Discharge: Family;Available 24 hours/day Type of Home: Apartment Home Access: Level entry Home Layout: One level Additional Comments: info for eval taken from previous CIR eval as well as yes/no questions as no family present during eval  Lives With: Spouse Prior Function Level of Independence: Independent with basic ADLs;Independent with transfers  Able to Take Stairs?: Yes Driving: Yes Comments: Prior to recent admit for CVA, patient occasionally used cane for ambulation.  Cognition Overall Cognitive Status: Impaired/Different from baseline Arousal/Alertness: Awake/alert Orientation Level: Oriented to person;Oriented to place;Oriented to situation Attention: Selective Selective Attention: Appears intact Awareness: Impaired Awareness Impairment: Emergent impairment Problem Solving: Impaired Problem Solving Impairment: Functional basic;Verbal basic Safety/Judgment: Impaired Sensation Sensation Light Touch: Impaired Detail Light Touch Impaired Details: Impaired RLE;Impaired RUE Proprioception: Impaired Detail Proprioception Impaired Details: Impaired RLE;Impaired RUE Coordination Gross Motor Movements are Fluid and Coordinated: No Fine Motor Movements are Fluid and Coordinated: No Coordination and Movement Description: Rt hemiplegia Motor  Motor Motor: Abnormal postural alignment and control;Hemiplegia Motor  - Skilled Clinical Observations: Rt hemiplegia   Trunk/Postural Assessment  Cervical Assessment Cervical Assessment: Within Functional Limits Thoracic Assessment Thoracic Assessment:  (mild kyphosis) Lumbar Assessment Lumbar Assessment:  (posterior pelvic tilt) Postural Control Postural Control: Deficits on evaluation Righting Reactions: delayed  Balance Dynamic Sitting Balance Sitting balance - Comments: min guard for reaching tasks Dynamic Standing Balance Dynamic Standing - Comments: total A for standing balance without UE support Extremity Assessment      RLE Assessment RLE Assessment:  (grossly 3-/5) LLE Assessment LLE Assessment: Within Functional Limits   See Function Navigator for Current Functional Status.   Refer to Care Plan for Long Term Goals  Recommendations for other services: Therapeutic Recreation  Stress management  Discharge Criteria: Patient will be discharged from PT if patient refuses treatment 3 consecutive times without medical reason, if treatment goals not met, if there is a change in medical status, if patient makes no progress towards goals or if patient is discharged from hospital.  The above assessment, treatment plan, treatment alternatives and goals were discussed and mutually agreed upon: by patient and by family  Roman Dubuc 12/24/2016, 2:17 PM

## 2016-12-24 NOTE — Evaluation (Signed)
Speech Language Pathology Assessment and Plan  Patient Details  Name: Valerie Mcclure MRN: [id number] Date of Birth: Apr 11, 1941  SLP Diagnosis: Aphasia;Dysarthria;Dysphagia;Apraxia;Cognitive Impairments  Rehab Potential: Good ELOS: 10-14 days     Today's Date: 12/24/2016 SLP Individual Time: 1100-1200 SLP Individual Time Calculation (min): 60 min   Problem List:  Patient Active Problem List   Diagnosis Date Noted  . Hypoalbuminemia due to protein-calorie malnutrition ([place])   . Global aphasia   . Encephalopathy 12/23/2016  . Acute on chronic diastolic heart failure ([place])   . Acute blood loss anemia   . Tachypnea   . Toxic encephalopathy 12/22/2016  . Atrial flutter ([place]) 12/22/2016  . Hypokalemia 12/21/2016  . Obesity (BMI 30-39.9) 12/21/2016  . Narcotic overdose 12/19/2016  . Spastic hemiplegia of right dominant side as late effect of cerebral infarction ([place])   . E-coli UTI   . Anemia of chronic disease   . Benign essential HTN   . Dysphagia, post-stroke   . Expressive aphasia   . Anterior cerebral circulation hemorrhagic infarction ([place]) 12/03/2016  . PAC (premature atrial contraction) 12/03/2016  . Acute ischemic left MCA stroke ([place]) 12/03/2016  . Aphasia as late effect of stroke 12/03/2016  . Right hemiparesis ([place])   . Nontraumatic subcortical hemorrhage of left cerebral hemisphere ([place])   . Acute embolic stroke ([place]) 29/57/4734  . Mild aortic regurgitation 08/20/2016  . Bilateral edema of lower extremity 04/07/2016  . Pancreatic duct dilated 01/09/2016  . CKD (chronic kidney disease) 12/25/2015  . Mild tricuspid regurgitation 12/25/2015  . Mild mitral regurgitation 12/25/2015  . Prediabetes 06/10/2015  . Edema 06/04/2015  . Abdominal pain, chronic, right upper quadrant 06/04/2015  . Postmenopausal disorder 04/03/2014  . Chronic low back pain   . PAIN IN THORACIC SPINE 04/18/2010  . Coronary atherosclerosis 01/24/2009  . DEGENERATIVE JOINT DISEASE  01/23/2009  . ARTHRITIS, LEFT KNEE 12/13/2008  . HERNIATED LUMBAR DISC 12/13/2008  . Hypothyroidism 11/21/2007  . Hyperlipidemia 11/09/2007  . ANEMIA 11/09/2007  . Essential hypertension 11/09/2007  . Osteopenia 11/09/2007   Past Medical History:  Past Medical History:  Diagnosis Date  . Blood transfusion without reported diagnosis   . Chronic low back pain   . Hyperlipidemia   . Osteopenia   . Unspecified essential hypertension   . Unspecified hypothyroidism    Past Surgical History:  Past Surgical History:  Procedure Laterality Date  . ABDOMINAL HYSTERECTOMY  1970  . CHOLECYSTECTOMY  07/2009   Dr. Rise Patience  . COLONOSCOPY  2003  . FLEXIBLE SIGMOIDOSCOPY  2010  . HAND SURGERY    . IR ANGIO INTRA EXTRACRAN SEL COM CAROTID INNOMINATE UNI L MOD SED  11/29/2016  . IR ANGIO VERTEBRAL SEL SUBCLAVIAN INNOMINATE UNI R MOD SED  11/29/2016  . IR PERCUTANEOUS ART THROMBECTOMY/INFUSION INTRACRANIAL INC DIAG ANGIO  11/29/2016  . LUMBAR LAMINECTOMY  11/2008   Done by Dr. Patrice Paradise  . RADIOLOGY WITH ANESTHESIA N/A 11/29/2016   Procedure: RADIOLOGY WITH ANESTHESIA;  Surgeon: Radiologist, Medication, MD;  Location: [facility];  Service: Radiology;  Laterality: N/A;  . THYROIDECTOMY      Assessment / Plan / Recommendation Clinical Impression   Valerie Mcclure a 76 y.o.femalewith history of chronic abdominal and LBP, CKD, hyperlipidemia who fell out of bed on 5/13 am and was found to have left facial weakness with slurred speech and right sided weakness.  Work up revealed hyperdense L-MCA sign with left M1 occlusion due to acute thrombus or embolus with significant penumbra left temporoparietal  lobe and irregular non-calcified plaque L-ICA. She underwent cerebral angio with complete revascularization of L-MCA with reperfusion with hemorrhagic transformation of left temporal infarct and minimal SDH. MRI brain revealed L-MCA infarct with diffusion restriction left temporal lobe, superimposed 5.5 cm  intra-axial hemorrhage centered at left basal ganglia with associated left hemisphere edema, effaced left lateral ventricle with trace IVH and occasional small foci of restriction in left thalamus and anterior left occipital lobe. DPatient with resultant right sided weakness with right lean, right inattention, aphasia, motor apraxia and DOE. CIR was recommended for follow up therapy. She was admitted to CIR on 12/03/16 and was maintained on ASA for secondary stroke prevention.  Her diet was advanced to dysphagia 2, nectar liquids with water protocol on 5/22. She has required cues for sequencing ADL tasks as well as mobility. She required min to mod assist with ADL tasks and mobility. She required supervision with cues to maintain aspiration precautions and functional communication was limited due to global aphasia with apraxia. On 6/2, she developed somnolence with hypoxia. She responded to multiple doses of narcan and stat CT head reviewed, showing improvement in midline shift. She developed agitation post narcan administration and required transfer to ICU. She was started on narcan drip for acute encephalopathy due to narcotic overdose. Mentation improved and narcan drip discontinued by 6/3. Work up negative and diet resumed. She developed atrial flutter with variable AV block and was started on metoprolol for rate control. Repeat CT 6/2 head showed near resolution of hematoma.  Dr. Erlinda Hong recommended resuming Eliquis with  close neuro monitoring and stat CT head prn acute changes to rule out ICH. Therapy resumed and patient was cleared to resume her rehab course.   Pt presents with similar deficits in comparison to last known functional status from previous CIR admission.  Pt demonstrated no overt s/s of aspiration with solids, nectar thick liquids, or thin liquids.  Pt had mild anterior loss of purees given impaired labial seal from oral motor weakness but otherwise tolerated solids well.  Recommend a repeat MBS  at next available appointment to determine readiness to advance diet.   Pt presents with a global (expressive>receptive) aphasia with apraxia.  She can follow simple 1 and 2 step commands but has difficulty with multi-unit commands.  She can answer immediate, environmental, or biographical yes/no questions easily but demonstrates difficulty with more complex questions.  Her verbal output is characterized by irregular production at the word and phrase level with limited awareness of errors.  She also is perseverative and has word finding impairments.   Cognition remains difficult to formally assess due to language impairments; however, pt continues to demonstrate decreased emergent awareness and right inattention to her body and the environment.   Pt would benefit from ongoing ST follow up while inpatient in order to maximize functional independence and reduce burden of care prior to discharge.   Anticipate that pt will need 24/7 supervision at discharge in addition to [place] follow up at next level of care.     Skilled Therapeutic Interventions          Cognitive-linguistic evaluation completed with results and recommendations reviewed with patient.  Pt was able to name basic, familiar objects for >80% accuracy, which improved to 100% accuracy with min assist in isolated practice.  Pt needed mod-max assist verbal cues during phrase closure tasks.  Pt with intermittent islands of clear speech but otherwise demonstrates perseverative and echolalic verbal errors with jargon.  Pt was returned to her room  and left in wheelchair with quick release belt donned for safety.  Call bell within reach.       SLP Assessment  Patient will need skilled Speech Lanaguage Pathology Services during CIR admission    Recommendations  SLP Diet Recommendations: Dysphagia 2 (Fine chop);Nectar Liquid Administration via: Cup;No straw Medication Administration: Whole meds with puree Supervision: Patient able to self feed;Full  supervision/cueing for compensatory strategies Compensations: Slow rate;Small sips/bites;Monitor for anterior loss;Multiple dry swallows after each bite/sip;Follow solids with liquid Postural Changes and/or Swallow Maneuvers: Seated upright 90 degrees;Upright 30-60 min after meal Oral Care Recommendations: Oral care BID Patient destination: Home Follow up Recommendations: Home Health SLP;Outpatient SLP;24 hour supervision/assistance Equipment Recommended: To be determined    SLP Frequency 3 to 5 out of 7 days   SLP Duration  SLP Intensity  SLP Treatment/Interventions 10-14 days   Minumum of 1-2 x/day, 30 to 90 minutes  Cognitive remediation/compensation;Cueing hierarchy;Dysphagia/aspiration precaution training;Environmental controls;Functional tasks;Internal/external aids;Patient/family education;Speech/Language facilitation    Pain Pain Assessment Pain Assessment: No/denies pain  Prior Functioning Cognitive/Linguistic Baseline: Within functional limits Type of Home: Apartment  Lives With: Spouse Available Help at Discharge: Family;Available 24 hours/day  Function:  Eating Eating   Modified Consistency Diet: Yes Eating Assist Level: Supervision or verbal cues           Cognition Comprehension Comprehension assist level: Understands basic 75 - 89% of the time/ requires cueing 10 - 24% of the time  Expression   Expression assist level: Expresses basic 25 - 49% of the time/requires cueing 50 - 75% of the time. Uses single words/gestures.  Social Interaction Social Interaction assist level: Interacts appropriately 50 - 74% of the time - May be physically or verbally inappropriate.  Problem Solving Problem solving assist level: Solves basic 25 - 49% of the time - needs direction more than half the time to initiate, plan or complete simple activities  Memory Memory assist level: Recognizes or recalls 25 - 49% of the time/requires cueing 50 - 75% of the time   Short Term  Goals: Week 1: SLP Short Term Goal 1 (Week 1): Pt will participate in repeat MBS to determine safest diet consistencies.   SLP Short Term Goal 2 (Week 1): Pt will recognize and correct verbal errors with mod assist multimodal cues.  SLP Short Term Goal 3 (Week 1): Pt will produce accurate phrase level expression during functional tasks for >75% of opportunities with mod assist verbal cues.   SLP Short Term Goal 4 (Week 1): Pt will follow multi-unit commands to complete functional tasks with mod assist verbal cues.    Refer to Care Plan for Long Term Goals  Recommendations for other services: None   Discharge Criteria: Patient will be discharged from SLP if patient refuses treatment 3 consecutive times without medical reason, if treatment goals not met, if there is a change in medical status, if patient makes no progress towards goals or if patient is discharged from hospital.  The above assessment, treatment plan, treatment alternatives and goals were discussed and mutually agreed upon: by patient  Emilio Math 12/24/2016, 4:16 PM

## 2016-12-25 ENCOUNTER — Inpatient Hospital Stay (HOSPITAL_COMMUNITY): Payer: Medicare Other

## 2016-12-25 ENCOUNTER — Inpatient Hospital Stay (HOSPITAL_COMMUNITY): Payer: Medicare Other | Admitting: Physical Therapy

## 2016-12-25 ENCOUNTER — Inpatient Hospital Stay (HOSPITAL_COMMUNITY): Payer: Medicare Other | Admitting: Occupational Therapy

## 2016-12-25 ENCOUNTER — Inpatient Hospital Stay (HOSPITAL_COMMUNITY): Payer: Medicare Other | Admitting: Speech Pathology

## 2016-12-25 LAB — POTASSIUM: POTASSIUM: 3.2 mmol/L — AB (ref 3.5–5.1)

## 2016-12-25 LAB — CULTURE, BLOOD (ROUTINE X 2)
CULTURE: NO GROWTH
Special Requests: ADEQUATE

## 2016-12-25 MED ORDER — IRBESARTAN 75 MG PO TABS
225.0000 mg | ORAL_TABLET | Freq: Every day | ORAL | Status: DC
  Administered 2016-12-26 – 2016-12-27 (×2): 225 mg via ORAL
  Filled 2016-12-25 (×2): qty 3

## 2016-12-25 MED ORDER — IRBESARTAN 75 MG PO TABS
75.0000 mg | ORAL_TABLET | Freq: Once | ORAL | Status: AC
  Administered 2016-12-25: 75 mg via ORAL
  Filled 2016-12-25: qty 1

## 2016-12-25 NOTE — Progress Notes (Signed)
Physical Therapy Session Note  Patient Details  Name: Valerie Mcclure MRN: [id number] Date of Birth: 21-Sep-1940  Today's Date: 12/25/2016 PT Individual Time: 1132-1200 PT Individual Time Calculation (min): 28 min    Skilled Therapeutic Interventions/Progress Updates:    Session 1:  1132-1200:  Session initiated with pt up in chair and denying pain.  Pt propelled w/c to gym using L UE/LE with mod assist and verbal cues.  Session focused overall on attention to task and procedural learning of functional activities due to cognitive deficits.  Worked on technique for sit to stand (with blocked practice) and decreasing posterior lean in sitting and standing which pt was able to improve quickly to only require supervision with sitting balance EOB and then min assist for sit to stand.  With fatigue requires assist to stabilize R LE.  Pt then ambulated approximately 20 ft with moderate assist x 1 without assistive device with manual assist to stabilize R knee during midstance.  Pt propelled chair back to room and was left seated up in chair with lap belt in place, call bell in reach, and nursing aware of pt location.  Session 2:  8127-5170:  BP:  152/68; Pulse:  67 bpm.  Pt denies pain.  Nursing present for dependent change of depend.  Pt shows minimal carryover with sit to stand and mat to chair transfer.  Pt requires moderate assist for sit to stand and max for transferring mat to chair in PM.  Pt ambulated approx 15 ft with moderate assist x 1 with cues to shift weight over R LE.  Session again attempted to focus on procedural learning, however, pt seems fatigued which may have limited participation.  Chris from [place] present for prescribing AFO to help control knee DF.  Following session, pt left in care of nursing who took pt for swallow study.   Therapy Documentation Precautions:  Precautions Precautions: Fall Precaution Comments: Rt hemiparesis, Rt inattention Restrictions Weight Bearing  Restrictions: No   See Function Navigator for Current Functional Status.   Therapy/Group: Individual Therapy  Valerie Mcclure [name] 12/25/2016, 12:19 PM

## 2016-12-25 NOTE — Progress Notes (Signed)
Occupational Therapy Session Note  Patient Details  Name: Valerie Mcclure MRN: [id number] Date of Birth: Jan 09, 1941  Today's Date: 12/25/2016 OT Individual Time: 0905-1000 OT Individual Time Calculation (min): 55 min    Short Term Goals: Week 1:  OT Short Term Goal 1 (Week 1): Pt will complete bathing with mod assist at sit > stand level  OT Short Term Goal 2 (Week 1): pt will don shirt with min assist OT Short Term Goal 3 (Week 1): pt will don pants with mod assist at sit > stand level OT Short Term Goal 4 (Week 1): Pt will complete toilet transfers with mod assist OT Short Term Goal 5 (Week 1): Pt will locate items on Rt to complete self-care tasks with min cues  Skilled Therapeutic Interventions/Progress Updates:    Treatment session with focus on trunk control, sitting and standing balance, and Rt attention.  Pt in bed upon arrival, requiring increased question cues to decipher that pt was telling therapist that her clothes were already picked out. Pt required mod assist for weight shifting and trunk control to come to EOB and max assist stand pivot transfer to w/c due to posterior lean and RLE extensor tone.  Bathing completed seated at sink with hand over hand assist to incorporate RUE, pt continues to demonstrate decreased awareness and attention to RUE with bathing and dressing tasks.  Max assist UB and LB dressing due to impaired sequencing and orientation of clothing.  Pt crossed leg over opposite knee to thread pant legs with max cues and assist to stabilize, pt able to thread RLE but not Lt.  Attempted backward chaining with donning socks, with pt able to pull both socks up after therapist placed socks over toes to midfoot.  Max assist for standing balance while attempting to pull pants over Lt hip, however due to posterior lean pt unable to assist with adjusting pants.  Left upright in w/c with pillow for improved positioning of RUE (due to poor fitting lap tray).    Therapy  Documentation Precautions:  Precautions Precautions: Fall Precaution Comments: Rt hemiparesis, Rt inattention Restrictions Weight Bearing Restrictions: No Pain: Pain Assessment Pain Assessment: No/denies pain  See Function Navigator for Current Functional Status.   Therapy/Group: Individual Therapy  Simonne Come 12/25/2016, 12:16 PM

## 2016-12-25 NOTE — Progress Notes (Signed)
Modified Barium Swallow Progress Note  Patient Details  Name: Valerie Mcclure MRN: [id number] Date of Birth: [birth year]  Today's Date: 12/25/2016  Modified Barium Swallow completed.  Full report located under Chart Review in the Imaging Section.  Brief recommendations include the following:  Clinical Impression         Pt presents with improved swallowing function in comparison to previous evaluation.  Pt demonstrated timely swallow initiation with no aspiration or penetration of nectar thick liquids via cup sips.  Decreased bolus cohesion with thin liquids resulted in delay in swallow initiation to the level of the pyriforms which lead to deep penetration to the cords which cleared on second swallow.  Use of chin tuck facilitated by straw kept penetration above the level of the cords, especially when pt consumed smaller boluses.  As a result, recommend that pt's diet be advanced to dys 2 textures, thin liquids with full supervision for use of chin tuck and small controlled sips via straw.        Swallow Evaluation Recommendations       SLP Diet Recommendations: Dysphagia 2 (Fine chop) solids;Thin liquid   Liquid Administration via: Cup;Straw   Medication Administration: Whole meds with puree   Supervision: Patient able to self feed;Full supervision/cueing for compensatory strategies   Compensations: Slow rate;Small sips/bites;Monitor for anterior loss;Multiple dry swallows after each bite/sip;Chin tuck;Clear throat intermittently   Postural Changes: Seated upright at 90 degrees            Orlen Leedy, Elmyra Ricks L 12/25/2016,2:45 PM

## 2016-12-25 NOTE — Care Management Note (Signed)
Inpatient Rehabilitation Center Individual Statement of Services  Patient Name:  Valerie Mcclure  Date:  12/25/2016  Welcome to the [facility].  Our goal is to provide you with an individualized program based on your diagnosis and situation, designed to meet your specific needs.  With this comprehensive rehabilitation program, you will be expected to participate in at least 3 hours of rehabilitation therapies Monday-Friday, with modified therapy programming on the weekends.  Your rehabilitation program will include the following services:  Physical Therapy (PT), Occupational Therapy (OT), Speech Therapy (ST), 24 hour per day rehabilitation nursing, Therapeutic Recreaction (TR), Neuropsychology, Case Management (Social Worker), Rehabilitation Medicine, Nutrition Services and Pharmacy Services  Weekly team conferences will be held on Wednesdays to discuss your progress.  Your Social Worker will talk with you frequently to get your input and to update you on team discussions.  Team conferences with you and your family in attendance may also be held.  Expected length of stay: 10-14 days  Overall anticipated outcome: minimal assistance  Depending on your progress and recovery, your program may change. Your Social Worker will coordinate services and will keep you informed of any changes. Your Social Worker's name and contact numbers are listed  below.  The following services may also be recommended but are not provided by the [facility] will be made to provide these services after discharge if needed.  Arrangements include referral to agencies that provide these services.  Your insurance has been verified to be:  [facility] Medicare Your primary doctor is:  Billey Gosling  Pertinent information will be shared with your doctor and your insurance  company.  Social Worker:  [place] or ([phone]   Information discussed with and copy given to patient by: Lennart Pall, 12/25/2016, 9:39 AM

## 2016-12-25 NOTE — Progress Notes (Signed)
Orthopedic Tech Progress Note Patient Details:  LAURANA MAGISTRO Feb 28, 1941 [id number]  Patient ID: [name], female   DOB: [birth year], 76 y.o.   MRN: [id number]   [place] Pink 12/25/2016, 8:11 AMCalled [employer] for AFO brace.

## 2016-12-25 NOTE — Progress Notes (Signed)
Occupational Therapy Session Note  Patient Details  Name: Valerie Mcclure MRN: [id number] Date of Birth: Mar 17, 1941  Today's Date: 12/25/2016 OT Individual Time: 1500-1530 OT Individual Time Calculation (min): 30 min    Short Term Goals: Week 1:  OT Short Term Goal 1 (Week 1): Pt will complete bathing with mod assist at sit > stand level  OT Short Term Goal 2 (Week 1): pt will don shirt with min assist OT Short Term Goal 3 (Week 1): pt will don pants with mod assist at sit > stand level OT Short Term Goal 4 (Week 1): Pt will complete toilet transfers with mod assist OT Short Term Goal 5 (Week 1): Pt will locate items on Rt to complete self-care tasks with min cues  Skilled Therapeutic Interventions/Progress Updates:    Pt received sitting up in w/c in room, expressing and appearing fatigued from therapy today, however willing and ready to participate in OT session. Focus of session on UE gross motor coordination, visual scanning and attending to R side. Pt transported via w/c to dayroom where Pt participated in seated table top activities requiring Pt to scan, match, and place cards in order. Pt requires mod verbal cues to recall sequence/instructions for completing various activities, Min verbal cues for scanning and locating cards on R side. OT utilized [facility] assist to facilitate incorporating R UE into table top activity. Pt transported back to room via w/c where Pt was left sitting up in w/c, family present in room, quick release belt donned, call bell and needs within reach.   Therapy Documentation Precautions:  Precautions Precautions: Fall Precaution Comments: Rt hemiparesis, Rt inattention Restrictions Weight Bearing Restrictions: No   Pain: Pain Assessment Pain Assessment: Faces Faces Pain Scale: No hurt    See Function Navigator for Current Functional Status.   Therapy/Group: Individual Therapy  Raymondo Band 12/25/2016, 4:17 PM

## 2016-12-25 NOTE — Progress Notes (Signed)
[facility] PHYSICAL MEDICINE & REHABILITATION     PROGRESS NOTE  Subjective/Complaints:  Pt seen laying in bed this AM.  She slept well overnight.  She notes improvement in strength.    ROS: Denies CP, SOB, N/V/D.  Objective: Vital Signs: Blood pressure (!) 168/68, pulse 68, temperature 98.9 F (37.2 C), temperature source Oral, resp. rate 18, height 5\' 3"  (1.6 m), weight 76.6 kg (168 lb 14 oz), SpO2 98 %. No results found.  Recent Labs  12/24/16 0556  WBC 10.3  HGB 10.8*  HCT 33.8*  PLT 311    Recent Labs  12/24/16 0556  NA 141  K 2.8*  CL 105  GLUCOSE 115*  BUN 9  CREATININE 1.09*  CALCIUM 8.5*   CBG (last 3)  No results for input(s): GLUCAP in the last 72 hours.  Wt Readings from Last 3 Encounters:  12/25/16 76.6 kg (168 lb 14 oz)  12/23/16 76.8 kg (169 lb 6.4 oz)  12/16/16 72.6 kg (160 lb)    Physical Exam:  BP (!) 168/68 (BP Location: Left Arm)   Pulse 68   Temp 98.9 F (37.2 C) (Oral)   Resp 18   Ht 5\' 3"  (1.6 m)   Wt 76.6 kg (168 lb 14 oz)   SpO2 98%   BMI 29.91 kg/m  Constitutional: She appears well-developed and well-nourished. NAD. HENT: Normocephalic and atraumatic.  Eyes: EOMI. No discharge.  Cardiovascular: RRR.  No JVD. Respiratory: Effort normal and breath sounds normal.  GI: Soft. Bowel sounds are normal.  Musculoskeletal: She exhibits no edema or tenderness.  Neurological: She is alert and oriented.  Right facial weakness with expressive>> receptive aphasia. Able to follow simple motor commands.  Motor: RUE: 3+/5 proximal to distal RLE; 3+/5 proximal to distal LUE/LLE: 5/5  Skin: Skin is warm and dry.  Psychiatric: She has a normal mood and affect. Her behavior is normal.   Assessment/Plan: 1. Functional deficits secondary to left MCA infarct which require 3+ hours per day of interdisciplinary therapy in a comprehensive inpatient rehab setting. Physiatrist is providing close team supervision and 24 hour management of active  medical problems listed below. Physiatrist and rehab team continue to assess barriers to discharge/monitor patient progress toward functional and medical goals.  Function:  Bathing Bathing position      Bathing parts      Bathing assist        Upper Body Dressing/Undressing Upper body dressing                    Upper body assist        Lower Body Dressing/Undressing Lower body dressing                                  Lower body assist        Toileting Toileting          Toileting assist     Transfers Chair/bed transfer     Chair/bed transfer assist level: Maximal assist (Pt 25 - 49%/lift and lower)       Locomotion Ambulation     Max distance: 40 ft Assist level: 2 helpers   Chief Financial Officer Comprehension Comprehension assist level: Understands basic 75 - 89% of the time/ requires cueing 10 - 24% of the time  Expression Expression assist level: Expresses basic 25 - 49% of the time/requires cueing  50 - 75% of the time. Uses single words/gestures.  Social Interaction Social Interaction assist level: Interacts appropriately 50 - 74% of the time - May be physically or verbally inappropriate.  Problem Solving Problem solving assist level: Solves basic 25 - 49% of the time - needs direction more than half the time to initiate, plan or complete simple activities  Memory Memory assist level: Recognizes or recalls 25 - 49% of the time/requires cueing 50 - 75% of the time    Medical Problem List and Plan: 1.  Right hemiparesis, global aphasia secondary to left MCA infarct.  Cont CIR 2.  DVT Prophylaxis/Anticoagulation: Pharmaceutical: Other (comment)--Eliquis. Monitor for any new neurological changes.  3. Pain Management: tylenol prn for now 4. Mood: No signs of distress--continue prozac. LCSW to follow for evaluation and support.  5. Neuropsych: This patient is not fully capable of making decisions on her own behalf. 6.  Skin/Wound Care: routine pressure measures 7. Fluids/Electrolytes/Nutrition: Monitor I/Os.   D2 nectar, continue water protocol in between meals.  8. AFib: Monitor HR bid--continue metoprolol and Eliquis 9. CKD: ?Baseline SCR 1.3-1.4. Improved with hydration  Cr 1.09 on 6/7  Cont to monitor.    Labs ordered for Monday 10. Encephalopathy: Resolved. Due to narcotic accumulation. 11. HTN: monitor BP bid.   Continue Lasix and metoprolol.   Avapro increased to 225 on 6/8  Monitor with increased mobility 12. Prediabetes: Educate on diet as cognition improves  Controlled 6/7 13. Diastolic Heart failure: On lasix, metoprolol, avapro and Zocor  Filed Weights   12/23/16 1930 12/24/16 0532 12/25/16 1027  Weight: 77.2 kg (170 lb 3.1 oz) 77.8 kg (171 lb 8.3 oz) 76.6 kg (168 lb 14 oz)  14. H/o anemia: Monitor H/H as now on Eliquis. Monitor for any signs of bleeding.   Hb 10.8 on 6/7  Labs ordered for monday  Cont to monitor 15. Hypoalbuminemia  Supplement initiated 6/7 16. Hypokalemia  Severely low at K+ 2.8  Supplementing x4 days  Lab ordered for today  Labs ordered for monday  LOS (Days) 2 A FACE TO FACE EVALUATION WAS PERFORMED  Seichi Kaufhold Lorie Phenix 12/25/2016 8:40 AM

## 2016-12-25 NOTE — Progress Notes (Signed)
Speech Language Pathology Daily Session Note  Patient Details  Name: Valerie Mcclure MRN: [id number] Date of Birth: [birth year]  Today's Date: 12/25/2016 SLP Individual Time: 1447-1500 SLP Individual Time Calculation (min): 13 min  Short Term Goals: Week 1: SLP Short Term Goal 1 (Week 1): Pt will participate in repeat MBS to determine safest diet consistencies.   SLP Short Term Goal 2 (Week 1): Pt will recognize and correct verbal errors with mod assist multimodal cues.  SLP Short Term Goal 3 (Week 1): Pt will produce accurate phrase level expression during functional tasks for >75% of opportunities with mod assist verbal cues.   SLP Short Term Goal 4 (Week 1): Pt will follow multi-unit commands to complete functional tasks with mod assist verbal cues.    Skilled Therapeutic Interventions:  Pt was seen for skilled ST therapy session targeting dysphagia goals following MBS.  Pt initially needed max assist multimodal cues for sequencing and motor planning of chin tuck; however, with increased repetition and practice therapist was able to fade cues to supervision.  Pt consumed thin liquids via straw with only x1 immediate reflexive cough when she looked up at therapist from chin tuck posture during the swallow.  All other sips were tolerated without overt s/s of aspiration.  A sign was posted in pt's room to facilitate carryover of swallowing precautions in between therapy sessions.  Continue per current plan of care.       Function:  Eating Eating   Modified Consistency Diet: Yes Eating Assist Level: Supervision or verbal cues           Cognition Comprehension Comprehension assist level: Understands basic 75 - 89% of the time/ requires cueing 10 - 24% of the time  Expression   Expression assist level: Expresses basic 50 - 74% of the time/requires cueing 25 - 49% of the time. Needs to repeat parts of sentences.  Social Interaction Social Interaction assist level: Interacts  appropriately 50 - 74% of the time - May be physically or verbally inappropriate.  Problem Solving Problem solving assist level: Solves basic 25 - 49% of the time - needs direction more than half the time to initiate, plan or complete simple activities  Memory Memory assist level: Recognizes or recalls 25 - 49% of the time/requires cueing 50 - 75% of the time    Pain Pain Assessment Pain Assessment: No/denies pain  Therapy/Group: Individual Therapy  Caitlynn Ju, Selinda Orion 12/25/2016, 4:14 PM

## 2016-12-25 NOTE — Progress Notes (Signed)
Social Work Patient ID: Valerie Mcclure, female   DOB: 12-Apr-1941, 76 y.o.   MRN: [id number]  Original assessment reviewed with pt and spouse.  No changes needed.   Valerie Mcclure, [place] Social Worker Signed   Progress Notes Date of Service: 12/04/2016  4:39 PM  Related encounter: Admission (Discharged) from 12/03/2016 in [facility] copied text [place] for attribution information Social Work  Social Work Assessment and Plan   Patient Details  Name: Valerie Mcclure MRN: [id number] Date of Birth: 19-Sep-1940   Today's Date: 12/04/2016   Problem List:      Patient Active Problem List    Diagnosis Date Noted  . Anemia of chronic disease    . Benign essential HTN    . Dysphagia, post-stroke    . Expressive aphasia    . Anterior cerebral circulation hemorrhagic infarction ([place]) 12/03/2016  . PAC (premature atrial contraction) 12/03/2016  . Acute ischemic left MCA stroke ([place]) 12/03/2016  . Aphasia as late effect of stroke 12/03/2016  . Right hemiparesis ([place])    . Nontraumatic subcortical hemorrhage of left cerebral hemisphere ([place])    . Acute embolic stroke ([place]) 75/64/3329  . Mild aortic regurgitation 08/20/2016  . Bilateral edema of lower extremity 04/07/2016  . Pancreatic duct dilated 01/09/2016  . CKD (chronic kidney disease) 12/25/2015  . Mild tricuspid regurgitation 12/25/2015  . Mild mitral regurgitation 12/25/2015  . Prediabetes 06/10/2015  . Edema 06/04/2015  . Abdominal pain, chronic, right upper quadrant 06/04/2015  . Postmenopausal disorder 04/03/2014  . Chronic low back pain    . PAIN IN THORACIC SPINE 04/18/2010  . Coronary atherosclerosis 01/24/2009  . DEGENERATIVE JOINT DISEASE 01/23/2009  . ARTHRITIS, LEFT KNEE 12/13/2008  . HERNIATED LUMBAR DISC 12/13/2008  . Hypothyroidism 11/21/2007  . Hyperlipidemia 11/09/2007  . ANEMIA 11/09/2007  . Essential hypertension 11/09/2007  . Osteopenia 11/09/2007    Past  Medical History:      Past Medical History:  Diagnosis Date  . Blood transfusion without reported diagnosis    . Chronic low back pain    . Hyperlipidemia    . Osteopenia    . Unspecified essential hypertension    . Unspecified hypothyroidism      Past Surgical History:       Past Surgical History:  Procedure Laterality Date  . ABDOMINAL HYSTERECTOMY   1970  . CHOLECYSTECTOMY   07/2009    Dr. Rise Patience  . COLONOSCOPY   2003  . FLEXIBLE SIGMOIDOSCOPY   2010  . HAND SURGERY      . IR ANGIO INTRA EXTRACRAN SEL COM CAROTID INNOMINATE UNI L MOD SED   11/29/2016  . IR ANGIO VERTEBRAL SEL SUBCLAVIAN INNOMINATE UNI R MOD SED   11/29/2016  . IR PERCUTANEOUS ART THROMBECTOMY/INFUSION INTRACRANIAL INC DIAG ANGIO   11/29/2016  . LUMBAR LAMINECTOMY   11/2008    Done by Dr. Patrice Paradise  . RADIOLOGY WITH ANESTHESIA N/A 11/29/2016    Procedure: RADIOLOGY WITH ANESTHESIA;  Surgeon: Radiologist, Medication, MD;  Location: [facility];  Service: Radiology;  Laterality: N/A;  . THYROIDECTOMY        Social History:  reports that she has never smoked. She has never used smokeless tobacco. She reports that she does not drink alcohol or use drugs.   Family / Support Systems Marital Status: Married How Long?: 23 yrs (2nd marriage for both) Patient Roles: Spouse, Parent Spouse/Significant Other: Valerie Mcclure @ (H)  [phone] Children: daughter, Valerie Mcclure [facility]) @ (C) [phone]; daughter, Valerie Mcclure [facility]) @ (C) [phone]; step-daughter, Valerie Mcclure (local) @ [phone] Other Supports: extended family in area Anticipated Caregiver: Dtr, step daughter and spouse Ability/Limitations of Caregiver: Husband is retired and can assist.  Dtr and step daughter can alternate. Caregiver Availability: 24/7 Family Dynamics: Pt describes all family as supportive.  Daughter, notes that her step-father is very supportive but does have health issues.  Limited support from husband's sons.   Social  History Preferred language: English Religion: Baptist Cultural Background: NA Read: Yes Write: Yes Employment Status: Retired Freight forwarder Issues: None Guardian/Conservator: None - per MD, pt is not capable of making decisions on her own behalf - defer to spouse    Abuse/Neglect Physical Abuse: Denies Verbal Abuse: Denies Sexual Abuse: Denies Exploitation of patient/patient's resources: Denies Self-Neglect: Denies   Emotional Status Pt's affect, behavior adn adjustment status: Pt lying in bed and very fatigued from first day of therapy.   She attempts to complete interview as best as she can but speech is slurred and has to repeat at times.  Answer are accurate per daughter.  She admits to much frustration with her limitations and "rolls eyes" and she states, "you don't know how bad this is...."  We spoke about neuropsychology input and she is agreeable to this. Recent Psychosocial Issues: Husband recently had a heart attack but recovering well. Pyschiatric History: None Substance Abuse History: None   Patient / Family Perceptions, Expectations & Goals Pt/Family understanding of illness & functional limitations: Pt and family with basic understanding of her stroke, IR retreival of clot and current functional limitations/ need for CIR. Premorbid pt/family roles/activities: Pt was completely independent and active home and in the community. Anticipated changes in roles/activities/participation: Initial tx goals being set for minimal assistance - husband and family will need to assume some hands on caregiver roles as they are able. Pt/family expectations/goals: Pt states desire to "do for myself..."   [employer]: None Premorbid Home Care/DME Agencies: None Transportation available at discharge: yes Resource referrals recommended: Neuropsychology, Support group (specify)   Discharge Planning Living Arrangements: Spouse/significant  other Support Systems: Spouse/significant other, Children, Other relatives, Friends/neighbors Type of Residence: Private residence Insurance Resources: [employer] ([place] Medicare) Museum/gallery curator Resources: [place] Referred: No Living Expenses: Education officer, community Management: Spouse Does the patient have any problems obtaining your medications?: No Home Management: pt and spouse Patient/Family Preliminary Plans: Pt to d/c home with elderly spouse with recent health issues.  Family able to provide added support for them both. Social Work Anticipated Follow Up Needs: HH/OP Expected length of stay: 19-22 days   Clinical Impression Pleasant woman here following a CVA and with physical and speech deficits.  Daughter at bedside and very supportive.  Both report that pt's spouse has recent health issues and really cannot provide much more than supervision.  Local family can provide additional assist.  Pt admits to some emotional adjustment issues related to loss of independence and agreeable with neuropsychology involvement.  Will follow.   Vernor Monnig 12/04/2016, 4:39 PM

## 2016-12-26 ENCOUNTER — Inpatient Hospital Stay (HOSPITAL_COMMUNITY): Payer: Medicare Other | Admitting: Physical Therapy

## 2016-12-26 ENCOUNTER — Inpatient Hospital Stay (HOSPITAL_COMMUNITY): Payer: Medicare Other | Admitting: Occupational Therapy

## 2016-12-26 ENCOUNTER — Inpatient Hospital Stay (HOSPITAL_COMMUNITY): Payer: Medicare Other | Admitting: Speech Pathology

## 2016-12-26 DIAGNOSIS — I69322 Dysarthria following cerebral infarction: Secondary | ICD-10-CM

## 2016-12-26 DIAGNOSIS — R471 Dysarthria and anarthria: Secondary | ICD-10-CM

## 2016-12-26 LAB — URINE CULTURE: Culture: >=100000 — AB

## 2016-12-26 LAB — GLUCOSE, CAPILLARY: GLUCOSE-CAPILLARY: 138 mg/dL — AB (ref 65–99)

## 2016-12-26 MED ORDER — SIMVASTATIN 20 MG PO TABS
10.0000 mg | ORAL_TABLET | Freq: Every day | ORAL | Status: DC
  Administered 2016-12-26 – 2017-01-06 (×12): 10 mg via ORAL
  Filled 2016-12-26 (×12): qty 1

## 2016-12-26 NOTE — Progress Notes (Signed)
Occupational Therapy Session Note  Patient Details  Name: Valerie Mcclure MRN: [id number] Date of Birth: 06-09-1941  Today's Date: 12/26/2016 OT Individual Time: 0700-0800 OT Individual Time Calculation (min): 60 min    Short Term Goals: Week 1:  OT Short Term Goal 1 (Week 1): Pt will complete bathing with mod assist at sit > stand level  OT Short Term Goal 2 (Week 1): pt will don shirt with min assist OT Short Term Goal 3 (Week 1): pt will don pants with mod assist at sit > stand level OT Short Term Goal 4 (Week 1): Pt will complete toilet transfers with mod assist OT Short Term Goal 5 (Week 1): Pt will locate items on Rt to complete self-care tasks with min cues  Skilled Therapeutic Interventions/Progress Updates:patient seated in w/c upon approach for this session.   Per nursing report patient toileted, washed periarea and changed brief.    Patient stated she'd had a complete bath already.    This session was focused on right hand over hand using and patient initiation to use right hand to stabilize activities such as stabilizing toothbrush during application.  As well, she worked on dynamic sitting balance, especially with bilateral hip flexion to lean forward in chair to don socks and strapover shoes.  At end of session, patient was left seated and safely belted in her w/c and with call bell and phone within reach.      Therapy Documentation Precautions:  Precautions Precautions: Fall Precaution Comments: Rt hemiparesis, Rt inattention Restrictions Weight Bearing Restrictions: No  Pain: Pain Assessment Pain Assessment: No/denies pain    Visual Perceptual l- still some right in attention    See Function Navigator for Current Functional Status.   Therapy/Group: Individual Therapy  Alfredia Ferguson [facility] 12/26/2016, 12:34 PM

## 2016-12-26 NOTE — Progress Notes (Signed)
Speech Language Pathology Daily Session Note  Patient Details  Name: Valerie Mcclure MRN: [id number] Date of Birth: February 07, 1941  Today's Date: 12/26/2016 SLP Individual Time: 0950-1030 SLP Individual Time Calculation (min): 40 min  Short Term Goals: Week 1: SLP Short Term Goal 1 (Week 1): Pt will participate in repeat MBS to determine safest diet consistencies.   SLP Short Term Goal 2 (Week 1): Pt will recognize and correct verbal errors with mod assist multimodal cues.  SLP Short Term Goal 3 (Week 1): Pt will produce accurate phrase level expression during functional tasks for >75% of opportunities with mod assist verbal cues.   SLP Short Term Goal 4 (Week 1): Pt will follow multi-unit commands to complete functional tasks with mod assist verbal cues.    Skilled Therapeutic Interventions: Skilled treatment session focused on addressing speech and swallow goals. Upon SLP arrival patient with breakfast tray.  SLP asked if she needed assist to eat and patient replied "No, it has been sitting there since about 7:30." SLP provided fresh yogurt with fruit which patient consumed with effective oral clearance.  Patient required Total assist verbal, visual, and tactile cues to to complete a full chin tuck through completion of swallow with thin liquids via straw, when cues were faded to Max assist patient raised head prior to completion, which resulted in coughing.  SLP also facilitated session by providing Min-Mod assist question cues for phrase level expression with meal ordering task.  Continue with current plan of care.    Function:  Eating Eating   Modified Consistency Diet: Yes Eating Assist Level: Supervision or verbal cues;Help with picking up utensils;Help managing cup/glass;Set up assist for   Eating Set Up Assist For: Opening containers       Cognition Comprehension Comprehension assist level: Understands basic 75 - 89% of the time/ requires cueing 10 - 24% of the time  Expression    Expression assist level: Expresses basic 50 - 74% of the time/requires cueing 25 - 49% of the time. Needs to repeat parts of sentences.  Social Interaction Social Interaction assist level: Interacts appropriately 50 - 74% of the time - May be physically or verbally inappropriate.  Problem Solving Problem solving assist level: Solves basic 25 - 49% of the time - needs direction more than half the time to initiate, plan or complete simple activities  Memory Memory assist level: Recognizes or recalls 25 - 49% of the time/requires cueing 50 - 75% of the time    Pain Pain Assessment Pain Assessment: No/denies pain  Therapy/Group: Individual Therapy  Carmelia Roller., [place] [phone]  [place] 12/26/2016, 10:37 AM

## 2016-12-26 NOTE — Progress Notes (Signed)
Occupational Therapy Session Note  Patient Details  Name: Valerie Mcclure MRN: [id number] Date of Birth: 12-11-1940   Patient stated she was nauseated and has been all day.   She was able to complete the hour long OT session this am, but this afternoon, she stated she really felt too badly to get out of bed.    She stated her nurse is aware of her not feeling well.  Will offer therapy next scheduled day/time.     Therapy Documentation Precautions:  Precautions Precautions: Fall Precaution Comments: Rt hemiparesis, Rt inattention Restrictions Weight Bearing Restrictions: No General: General OT Amount of Missed Time:  (30)   Herschell Dimes 12/26/2016, 3:17 PM

## 2016-12-26 NOTE — Progress Notes (Signed)
[facility] MEDIC[facility] & RE[facility]ABILITATION     PROGRESS NOTE  Subjective/Complaints:  Pt seen laying in bed this AM.  She slept well overnight. She denies complaints.   ROS: Denies CP, SOB, N/V/D.  Objective: Vital Signs: Blood pressure (!) 157/74, pulse 72, temperature 98.9 F (37.2 C), temperature source Oral, resp. rate 16, height 5\' 3"  (1.6 m), weight 76.7 kg (169 lb 1.5 oz), SpO2 99 %. Dg Swallowing Func-speech Pathology  Result Date: 12/25/2016 Objective Swallowing Evaluation: Type of Study: MBS-Modified Barium Swallow Study Patient Details Name: MARCINA KINNISON MRN: [id number] Date of Birth: [birth year] Today's Date: 12/25/2016 Time: SLP Start [id number] -SLP Stop Time: 85 SLP Time Calculation (min) (ACUTE ONLY): 23 min Past Medical History: Past Medical History: Diagnosis Date . Blood transfusion without reported diagnosis  . Chronic low back pain  . Hyperlipidemia  . Osteopenia  . Unspecified essential hypertension  . Unspecified hypothyroidism  Past Surgical History: Past Surgical History: Procedure Laterality Date . ABDOMINAL HYSTERECTOMY  1970 . CHOLECYSTECTOMY  07/2009  Dr. Rise Patience . COLONOSCOPY  2003 . FLEXIBLE SIGMOIDOSCOPY  2010 . HAND SURGERY   . IR ANGIO INTRA EXTRACRAN SEL COM CAROTID INNOMINATE UNI L MOD SED  11/29/2016 . IR ANGIO VERTEBRAL SEL SUBCLAVIAN INNOMINATE UNI R MOD SED  11/29/2016 . IR PERCUTANEOUS ART THROMBECTOMY/INFUSION INTRACRANIAL INC DIAG ANGIO  11/29/2016 . LUMBAR LAMINECTOMY  11/2008  Done by Dr. Patrice Paradise . RADIOLOGY WITH ANESTHESIA N/A 11/29/2016  Procedure: RADIOLOGY WITH ANESTHESIA;  Surgeon: Radiologist, Medication, MD;  Location: [facility];  Service: Radiology;  Laterality: N/A; . THYROIDECTOMY   HPI: 76yo CF with acute L MCA CVA with secondary hemorrhage transferred from rehab for acute encephalopathy likely toxic 2/2 narcotic accumulation given her response to narcan. CT head demonstrates stability - improvement and other metabolic indices/septic work up has been  unrevealing. Pt receiving skilled ST in CIR for dysphagia and aphasia, had advanced to D2/nectar thick liquids prior to this admission. Recent MBS 12/08/16 findings mild-moderate oropharyngeal dysphagia with motor planning and sensorimotor deficits, recommended Dys 2/nectar with full supervision, meds whole in puree.  Repeat MBS ordered to determine readiness for advancement given improvements noted at bedside.   Subjective: pt alert, aphasic and dysarthric Assessment / Plan / Recommendation CHL IP CLINICAL IMPRESSIONS 12/25/2016 Clinical Impression Pt presents with improved swallowing function in comparison to previous swallow evaluation.  Pt demonstrated timely swallow initiation with no aspiration or penetration of nectar thick liquids via cup sips.  Decreased bolus cohesion with thin liquids resulted in delay in swallow initiation to the level of the pyriforms which lead to deep penetration to the cords which cleared on second swallow.  Use of chin tuck facilitated by straw kept penetration above the level of the cords, especially when pt consumed smaller boluses.  As a result, recommend that pt's diet be advanced to dys 2 textures, thin liquids with full supervision for use of chin tuck and small controlled sips via straw.   SLP Visit Diagnosis Dysphagia, oropharyngeal phase (R13.12)     Impact on safety and function Mild aspiration risk     CHL IP DIET RECOMMENDATION 12/25/2016 SLP Diet Recommendations Dysphagia 2 (Fine chop) solids;Thin liquid Liquid Administration via Cup;Straw Medication Administration Whole meds with puree Compensations Slow rate;Small sips/bites;Monitor for anterior loss;Multiple dry swallows after each bite/sip;Chin tuck;Clear throat intermittently Postural Changes Seated upright at 90 degrees            CHL IP ORAL PHASE 12/25/2016 Oral Phase Impaired Oral - Pudding Teaspoon --  Oral - Pudding Cup -- Oral - Honey Teaspoon -- Oral - Honey Cup -- Oral - Nectar Teaspoon -- Oral - Nectar Cup WFL  Oral - Nectar Straw -- Oral - Thin Teaspoon -- Oral - Thin Cup Decreased bolus cohesion;Premature spillage Oral - Thin Straw Decreased bolus cohesion;Premature spillage Oral - Puree WFL Oral - Mech Soft Premature spillage;Impaired mastication;Decreased bolus cohesion Oral - Regular -- Oral - Multi-Consistency -- Oral - Pill -- Oral Phase - Comment --  CHL IP PHARYNGEAL PHASE 12/25/2016 Pharyngeal Phase Impaired Pharyngeal- Pudding Teaspoon -- Pharyngeal -- Pharyngeal- Pudding Cup -- Pharyngeal -- Pharyngeal- Honey Teaspoon -- Pharyngeal -- Pharyngeal- Honey Cup -- Pharyngeal -- Pharyngeal- Nectar Teaspoon -- Pharyngeal -- Pharyngeal- Nectar Cup WFL Pharyngeal -- Pharyngeal- Nectar Straw -- Pharyngeal -- Pharyngeal- Thin Teaspoon -- Pharyngeal -- Pharyngeal- Thin Cup Delayed swallow initiation-pyriform sinuses;Penetration/Aspiration during swallow Pharyngeal Material enters airway, CONTACTS cords and then ejected out Pharyngeal- Thin Straw Compensatory strategies attempted (with notebox);Penetration/Aspiration during swallow;Delayed swallow initiation-pyriform sinuses Pharyngeal Material enters airway, remains ABOVE vocal cords then ejected out Pharyngeal- Puree Pharyngeal residue - valleculae Pharyngeal -- Pharyngeal- Mechanical Soft Pharyngeal residue - valleculae Pharyngeal -- Pharyngeal- Regular -- Pharyngeal -- Pharyngeal- Multi-consistency -- Pharyngeal -- Pharyngeal- Pill -- Pharyngeal -- Pharyngeal Comment --  CHL IP CERVICAL ESOPHAGEAL PHASE 12/25/2016 Cervical Esophageal Phase WFL Pudding Teaspoon -- Pudding Cup -- Honey Teaspoon -- Honey Cup -- Nectar Teaspoon -- Nectar Cup -- Nectar Straw -- Thin Teaspoon -- Thin Cup -- Thin Straw -- Puree -- Mechanical Soft -- Regular -- Multi-consistency -- Pill -- Cervical Esophageal Comment -- No flowsheet data found. Page, Selinda Orion 12/25/2016, 2:38 PM               Recent Labs  12/24/16 0556  WBC 10.3  HGB 10.8*  HCT 33.8*  PLT 311    Recent Labs   12/24/16 0556 12/25/16 0958  NA 141  --   K 2.8* 3.2*  CL 105  --   GLUCOSE 115*  --   BUN 9  --   CREATININE 1.09*  --   CALCIUM 8.5*  --    CBG (last 3)  No results for input(s): GLUCAP in the last 72 hours.  Wt Readings from Last 3 Encounters:  12/26/16 76.7 kg (169 lb 1.5 oz)  12/23/16 76.8 kg (169 lb 6.4 oz)  12/16/16 72.6 kg (160 lb)    Physical Exam:  BP (!) 157/74 (BP Location: Left Arm)   Pulse 72   Temp 98.9 F (37.2 C) (Oral)   Resp 16   Ht 5\' 3"  (1.6 m)   Wt 76.7 kg (169 lb 1.5 oz)   SpO2 99%   BMI 29.95 kg/m  Constitutional: She appears well-developed and well-nourished. NAD. HENT: Normocephalic and atraumatic.  Eyes: EOMI. No discharge.  Cardiovascular: RRR.  No JVD. Respiratory: Effort normal and breath sounds normal.  GI: Soft. Bowel sounds are normal.  Musculoskeletal: She exhibits no edema or tenderness.  Neurological: She is alert and oriented.  Right facial weakness with expressive>> receptive aphasia. Able to follow simple motor commands.  Motor: RUE: 3+/5 proximal to distal (stable) RLE; 3+/5 proximal to distal LUE/LLE: 5/5  Skin: Skin is warm and dry.  Psychiatric: She has a normal mood and affect. Her behavior is normal.   Assessment/Plan: 1. Functional deficits secondary to left MCA infarct which require 3+ hours per day of interdisciplinary therapy in a comprehensive inpatient rehab setting. Physiatrist is providing close team supervision and 24 hour  management of active medical problems listed below. Physiatrist and rehab team continue to assess barriers to discharge/monitor patient progress toward functional and medical goals.  Function:  Bathing Bathing position   Position: Wheelchair/chair at sink  Bathing parts Body parts bathed by patient: Chest, Abdomen, Right upper leg, Left upper leg Body parts bathed by helper: Right arm, Left arm, Front perineal area, Buttocks, Right lower leg, Left lower leg, Back  Bathing assist Assist  Level:  (Max assist)      Upper Body Dressing/Undressing Upper body dressing   What is the patient wearing?: Bra, Pull over shirt/dress   Bra - Perfomed by helper: Thread/unthread right bra strap, Thread/unthread left bra strap, Hook/unhook bra (pull down sports bra) Pull over shirt/dress - Perfomed by patient: Put head through opening Pull over shirt/dress - Perfomed by helper: Thread/unthread right sleeve, Thread/unthread left sleeve, Pull shirt over trunk        Upper body assist Assist Level:  (Max assist)      Lower Body Dressing/Undressing Lower body dressing   What is the patient wearing?: Pants, Non-skid slipper socks       Pants- Performed by helper: Thread/unthread right pants leg, Thread/unthread left pants leg, Pull pants up/down   Non-skid slipper socks- Performed by helper: Don/doff right sock, Don/doff left sock                  Lower body assist Assist for lower body dressing:  (Total assist)      Toileting Toileting          Toileting assist     Transfers Chair/bed transfer   Chair/bed transfer method: Stand pivot Chair/bed transfer assist level: Touching or steadying assistance (Pt > 75%)       Locomotion Ambulation     Max distance:  (20 ft) Assist level: Moderate assist (Pt 50 - 74%)   Wheelchair     Max wheelchair distance:  (100 ft) Assist Level: Moderate assistance (Pt 50 - 74%)  Cognition Comprehension Comprehension assist level: Understands basic 75 - 89% of the time/ requires cueing 10 - 24% of the time  Expression Expression assist level: Expresses basic 50 - 74% of the time/requires cueing 25 - 49% of the time. Needs to repeat parts of sentences.  Social Interaction Social Interaction assist level: Interacts appropriately 50 - 74% of the time - May be physically or verbally inappropriate.  Problem Solving Problem solving assist level: Solves basic 25 - 49% of the time - needs direction more than half the time to initiate,  plan or complete simple activities  Memory Memory assist level: Recognizes or recalls 25 - 49% of the time/requires cueing 50 - 75% of the time    Medical Problem List and Plan: 1.  Right hemiparesis, global aphasia secondary to left MCA infarct.  Cont CIR 2.  DVT Prophylaxis/Anticoagulation: Pharmaceutical: Other (comment)--Eliquis. Monitor for any new neurological changes.  3. Pain Management: tylenol prn for now 4. Mood: No signs of distress--continue prozac. LCSW to follow for evaluation and support.  5. Neuropsych: This patient is not fully capable of making decisions on her own behalf. 6. Skin/Wound Care: routine pressure measures 7. Fluids/Electrolytes/Nutrition: Monitor I/Os.   D2 nectar, continue water protocol in between meals.  8. AFib: Monitor HR bid--continue metoprolol and Eliquis 9. CKD: ?Baseline SCR 1.3-1.4. Improved with hydration  Cr 1.09 on 6/7  Cont to monitor.    Labs ordered for Monday 10. Encephalopathy: Resolved. Due to narcotic accumulation. 11. HTN: monitor BP  bid.   Continue Lasix and metoprolol.   Avapro increased to 225 on 6/8  Improving, cont to monitor 12. Prediabetes: Educate on diet as cognition improves  Controlled 6/7 13. Diastolic Heart failure: On lasix, metoprolol, avapro and Zocor  Filed Weights   12/24/16 0532 12/25/16 0614 12/26/16 0513  Weight: 77.8 kg (171 lb 8.3 oz) 76.6 kg (168 lb 14 oz) 76.7 kg (169 lb 1.5 oz)  14. H/o anemia: Monitor H/H as now on Eliquis. Monitor for any signs of bleeding.   Hb 10.8 on 6/7  Labs ordered for monday  Cont to monitor 15. Hypoalbuminemia  Supplement initiated 6/7 16. Hypokalemia  K+ 3.2 on 6/8  Supplementing x4 days  Labs ordered for monday  LOS (Days) 3 A FACE TO FACE EVALUATION WAS PERFORMED  Auria Mckinlay Lorie Phenix 12/26/2016 9:22 AM

## 2016-12-26 NOTE — Progress Notes (Signed)
Physical Therapy Session Note  Patient Details  Name: Valerie Mcclure MRN: [id number] Date of Birth: 05-25-1941  Today's Date: 12/26/2016 PT Individual Time: 1112-1206 PT Individual Time Calculation (min): 54 min   Short Term Goals: Week 1:  PT Short Term Goal 1 (Week 1): pt will consistently perform transfers at mod A PT Short Term Goal 2 (Week 1): pt will perform gait with mod A x 50' in controlled environment  Skilled Therapeutic Interventions/Progress Updates:  Pt received in w/c & agreeable to tx. Session focused on NMR during gait, dynamic balance, postural control, & strengthening.  Transported pt to gym via w/c total assist for time management. Pt completes sit<>stand transfers with min assist and ambulates 85 ft + 75 ft with mod assist without AD. Therapist provides manual facilitation for weight shifting L<>R (pt limits weight shifting L) and tactile cuing for upright posture. Pt with R hip instability during gait. Attempted to ambulate with [facility] but pt with great difficulty sequencing gait pattern with use of AD despite max multimodal cuing. Pt engaged in standing activity with task focusing on standing balance & tolerance and postural control. Pt requires mod assist for standing balance 2/2 significant posterior lean and inability to correct despite max multimodal cuing on first trial but with slightly improved ability to correct on 2nd trial.  Pt utilized cybex kinetron in sitting for BLE strengthening & R NMR. At end of session pt left sitting in w/c in room with QRB donned & all needs within reach.  During session pt noted fatigue and required rest breaks PRN.  Therapy Documentation Precautions:  Precautions Precautions: Fall Precaution Comments: Rt hemiparesis, Rt inattention Restrictions Weight Bearing Restrictions: No  Pain: Denied c/o pain.  See Function Navigator for Current Functional Status.   Therapy/Group: Individual Therapy  Waunita Schooner 12/26/2016,  12:26 PM

## 2016-12-27 MED ORDER — IRBESARTAN 300 MG PO TABS
300.0000 mg | ORAL_TABLET | Freq: Every day | ORAL | Status: DC
  Administered 2016-12-28 – 2017-01-07 (×11): 300 mg via ORAL
  Filled 2016-12-27 (×11): qty 1

## 2016-12-27 MED ORDER — IRBESARTAN 75 MG PO TABS
75.0000 mg | ORAL_TABLET | Freq: Once | ORAL | Status: AC
  Administered 2016-12-27: 75 mg via ORAL
  Filled 2016-12-27: qty 1

## 2016-12-27 NOTE — Progress Notes (Signed)
[facility] PHYSICAL MEDICINE & REHABILITATION     PROGRESS NOTE  Subjective/Complaints:  Pt seen laying in bed this AM.  She slept well overnight.  She believes she is getting stronger.   ROS: Denies CP, SOB, N/V/D.  Objective: Vital Signs: Blood pressure (!) 149/69, pulse 60, temperature 98.3 F (36.8 C), temperature source Oral, resp. rate 18, height 5\' 3"  (1.6 m), weight 76.8 kg (169 lb 5 oz), SpO2 99 %. Dg Swallowing Func-speech Pathology  Result Date: 12/25/2016 Objective Swallowing Evaluation: Type of Study: MBS-Modified Barium Swallow Study Patient Details Name: Valerie Mcclure MRN: [id number] Date of Birth: 1940-12-03 Today's Date: 12/25/2016 Time: SLP Start [id number] -SLP Stop Time: 70 SLP Time Calculation (min) (ACUTE ONLY): 23 min Past Medical History: Past Medical History: Diagnosis Date . Blood transfusion without reported diagnosis  . Chronic low back pain  . Hyperlipidemia  . Osteopenia  . Unspecified essential hypertension  . Unspecified hypothyroidism  Past Surgical History: Past Surgical History: Procedure Laterality Date . ABDOMINAL HYSTERECTOMY  1970 . CHOLECYSTECTOMY  07/2009  Dr. Rise Patience . COLONOSCOPY  2003 . FLEXIBLE SIGMOIDOSCOPY  2010 . HAND SURGERY   . IR ANGIO INTRA EXTRACRAN SEL COM CAROTID INNOMINATE UNI L MOD SED  11/29/2016 . IR ANGIO VERTEBRAL SEL SUBCLAVIAN INNOMINATE UNI R MOD SED  11/29/2016 . IR PERCUTANEOUS ART THROMBECTOMY/INFUSION INTRACRANIAL INC DIAG ANGIO  11/29/2016 . LUMBAR LAMINECTOMY  11/2008  Done by Dr. Patrice Paradise . RADIOLOGY WITH ANESTHESIA N/A 11/29/2016  Procedure: RADIOLOGY WITH ANESTHESIA;  Surgeon: Radiologist, Medication, MD;  Location: [facility];  Service: Radiology;  Laterality: N/A; . THYROIDECTOMY   HPI: 76yo CF with acute L MCA CVA with secondary hemorrhage transferred from rehab for acute encephalopathy likely toxic 2/2 narcotic accumulation given her response to narcan. CT head demonstrates stability - improvement and other metabolic indices/septic  work up has been unrevealing. Pt receiving skilled ST in CIR for dysphagia and aphasia, had advanced to D2/nectar thick liquids prior to this admission. Recent MBS 12/08/16 findings mild-moderate oropharyngeal dysphagia with motor planning and sensorimotor deficits, recommended Dys 2/nectar with full supervision, meds whole in puree.  Repeat MBS ordered to determine readiness for advancement given improvements noted at bedside.   Subjective: pt alert, aphasic and dysarthric Assessment / Plan / Recommendation CHL IP CLINICAL IMPRESSIONS 12/25/2016 Clinical Impression Pt presents with improved swallowing function in comparison to previous swallow evaluation.  Pt demonstrated timely swallow initiation with no aspiration or penetration of nectar thick liquids via cup sips.  Decreased bolus cohesion with thin liquids resulted in delay in swallow initiation to the level of the pyriforms which lead to deep penetration to the cords which cleared on second swallow.  Use of chin tuck facilitated by straw kept penetration above the level of the cords, especially when pt consumed smaller boluses.  As a result, recommend that pt's diet be advanced to dys 2 textures, thin liquids with full supervision for use of chin tuck and small controlled sips via straw.   SLP Visit Diagnosis Dysphagia, oropharyngeal phase (R13.12)     Impact on safety and function Mild aspiration risk     CHL IP DIET RECOMMENDATION 12/25/2016 SLP Diet Recommendations Dysphagia 2 (Fine chop) solids;Thin liquid Liquid Administration via Cup;Straw Medication Administration Whole meds with puree Compensations Slow rate;Small sips/bites;Monitor for anterior loss;Multiple dry swallows after each bite/sip;Chin tuck;Clear throat intermittently Postural Changes Seated upright at 90 degrees            CHL IP ORAL PHASE 12/25/2016 Oral Phase Impaired  Oral - Pudding Teaspoon -- Oral - Pudding Cup -- Oral - Honey Teaspoon -- Oral - Honey Cup -- Oral - Nectar Teaspoon -- Oral -  Nectar Cup WFL Oral - Nectar Straw -- Oral - Thin Teaspoon -- Oral - Thin Cup Decreased bolus cohesion;Premature spillage Oral - Thin Straw Decreased bolus cohesion;Premature spillage Oral - Puree WFL Oral - Mech Soft Premature spillage;Impaired mastication;Decreased bolus cohesion Oral - Regular -- Oral - Multi-Consistency -- Oral - Pill -- Oral Phase - Comment --  CHL IP PHARYNGEAL PHASE 12/25/2016 Pharyngeal Phase Impaired Pharyngeal- Pudding Teaspoon -- Pharyngeal -- Pharyngeal- Pudding Cup -- Pharyngeal -- Pharyngeal- Honey Teaspoon -- Pharyngeal -- Pharyngeal- Honey Cup -- Pharyngeal -- Pharyngeal- Nectar Teaspoon -- Pharyngeal -- Pharyngeal- Nectar Cup WFL Pharyngeal -- Pharyngeal- Nectar Straw -- Pharyngeal -- Pharyngeal- Thin Teaspoon -- Pharyngeal -- Pharyngeal- Thin Cup Delayed swallow initiation-pyriform sinuses;Penetration/Aspiration during swallow Pharyngeal Material enters airway, CONTACTS cords and then ejected out Pharyngeal- Thin Straw Compensatory strategies attempted (with notebox);Penetration/Aspiration during swallow;Delayed swallow initiation-pyriform sinuses Pharyngeal Material enters airway, remains ABOVE vocal cords then ejected out Pharyngeal- Puree Pharyngeal residue - valleculae Pharyngeal -- Pharyngeal- Mechanical Soft Pharyngeal residue - valleculae Pharyngeal -- Pharyngeal- Regular -- Pharyngeal -- Pharyngeal- Multi-consistency -- Pharyngeal -- Pharyngeal- Pill -- Pharyngeal -- Pharyngeal Comment --  CHL IP CERVICAL ESOPHAGEAL PHASE 12/25/2016 Cervical Esophageal Phase WFL Pudding Teaspoon -- Pudding Cup -- Honey Teaspoon -- Honey Cup -- Nectar Teaspoon -- Nectar Cup -- Nectar Straw -- Thin Teaspoon -- Thin Cup -- Thin Straw -- Puree -- Mechanical Soft -- Regular -- Multi-consistency -- Pill -- Cervical Esophageal Comment -- No flowsheet data found. Page, Selinda Orion 12/25/2016, 2:38 PM              No results for input(s): WBC, HGB, HCT, PLT in the last 72 hours.  Recent Labs   12/25/16 0958  K 3.2*   CBG (last 3)   Recent Labs  12/26/16 1715  GLUCAP 138*    Wt Readings from Last 3 Encounters:  12/27/16 76.8 kg (169 lb 5 oz)  12/23/16 76.8 kg (169 lb 6.4 oz)  12/16/16 72.6 kg (160 lb)    Physical Exam:  BP (!) 149/69 (BP Location: Left Arm)   Pulse 60   Temp 98.3 F (36.8 C) (Oral)   Resp 18   Ht 5\' 3"  (1.6 m)   Wt 76.8 kg (169 lb 5 oz)   SpO2 99%   BMI 29.99 kg/m  Constitutional: She appears well-developed and well-nourished. NAD. HENT: Normocephalic and atraumatic.  Eyes: EOMI. No discharge.  Cardiovascular: RRR.  No JVD. Respiratory: Effort normal and breath sounds normal.  GI: Soft. Bowel sounds are normal.  Musculoskeletal: She exhibits no edema or tenderness.  Neurological: She is alert and oriented.  Right facial weakness with expressive>> receptive aphasia. Able to follow simple motor commands.  Motor: RUE: 4-/5 proximal to distal  RLE: 4-/5 proximal to distal LUE/LLE: 5/5  Skin: Skin is warm and dry.  Psychiatric: She has a normal mood and affect. Her behavior is normal.   Assessment/Plan: 1. Functional deficits secondary to left MCA infarct which require 3+ hours per day of interdisciplinary therapy in a comprehensive inpatient rehab setting. Physiatrist is providing close team supervision and 24 hour management of active medical problems listed below. Physiatrist and rehab team continue to assess barriers to discharge/monitor patient progress toward functional and medical goals.  Function:  Bathing Bathing position Bathing activity did not occur: N/A (stated she'd bathed already)  Position: Wheelchair/chair at sink  Bathing parts Body parts bathed by patient: Chest, Abdomen, Right upper leg, Left upper leg Body parts bathed by helper: Right arm, Left arm, Front perineal area, Buttocks, Right lower leg, Left lower leg, Back  Bathing assist Assist Level:  (Max assist)      Upper Body Dressing/Undressing Upper body  dressing Upper body dressing/undressing activity did not occur: N/A (stated she'd already dressed (only donned socks and shoes)) What is the patient wearing?: Bra, Pull over shirt/dress   Bra - Perfomed by helper: Thread/unthread right bra strap, Thread/unthread left bra strap, Hook/unhook bra (pull down sports bra) Pull over shirt/dress - Perfomed by patient: Put head through opening Pull over shirt/dress - Perfomed by helper: Thread/unthread right sleeve, Thread/unthread left sleeve, Pull shirt over trunk        Upper body assist Assist Level:  (Max assist)      Lower Body Dressing/Undressing Lower body dressing   What is the patient wearing?: Socks, Shoes       Pants- Performed by helper: Thread/unthread right pants leg, Thread/unthread left pants leg, Pull pants up/down   Non-skid slipper socks- Performed by helper: Don/doff right sock, Don/doff left sock   Socks - Performed by helper: Don/doff right sock, Don/doff left sock   Shoes - Performed by helper: Don/doff right shoe, Don/doff left shoe, Fasten right, Fasten left          Lower body assist Assist for lower body dressing:  (Total assist)      Toileting Toileting Toileting activity did not occur: N/A        Toileting assist     Transfers Chair/bed transfer   Chair/bed transfer method: Stand pivot Chair/bed transfer assist level: Touching or steadying assistance (Pt > 75%)       Locomotion Ambulation     Max distance: 85 ft Assist level: Moderate assist (Pt 50 - 74%)   Wheelchair     Max wheelchair distance:  (100 ft) Assist Level: Moderate assistance (Pt 50 - 74%)  Cognition Comprehension Comprehension assist level: Understands basic 75 - 89% of the time/ requires cueing 10 - 24% of the time  Expression Expression assist level: Expresses basic 25 - 49% of the time/requires cueing 50 - 75% of the time. Uses single words/gestures.  Social Interaction Social Interaction assist level: Interacts  appropriately with others with medication or extra time (anti-anxiety, antidepressant).  Problem Solving Problem solving assist level: Solves basic less than 25% of the time - needs direction nearly all the time or does not effectively solve problems and may need a restraint for safety  Memory Memory assist level: Recognizes or recalls 25 - 49% of the time/requires cueing 50 - 75% of the time    Medical Problem List and Plan: 1.  Right hemiparesis, global aphasia secondary to left MCA infarct.  Cont CIR 2.  DVT Prophylaxis/Anticoagulation: Pharmaceutical: Other (comment)--Eliquis. Monitor for any new neurological changes.  3. Pain Management: tylenol prn for now 4. Mood: No signs of distress--continue prozac. LCSW to follow for evaluation and support.  5. Neuropsych: This patient is not fully capable of making decisions on her own behalf. 6. Skin/Wound Care: routine pressure measures 7. Fluids/Electrolytes/Nutrition: Monitor I/Os.   D2 nectar, continue water protocol in between meals.  8. AFib: Monitor HR bid--continue metoprolol and Eliquis 9. CKD: ?Baseline SCR 1.3-1.4. Improved with hydration  Cr 1.09 on 6/7  Cont to monitor.    Labs ordered for tomorrow 10. Encephalopathy: Resolved. Due to narcotic accumulation.  11. HTN: monitor BP bid.   Continue Lasix and metoprolol.   Avapro increased to 225 on 6/8, increased to 300 on 6/10 12. Prediabetes: Educate on diet as cognition improves  Overall controlled 6/10 13. Diastolic Heart failure: On lasix, metoprolol, avapro and Zocor  Filed Weights   12/25/16 0614 12/26/16 0513 12/27/16 0527  Weight: 76.6 kg (168 lb 14 oz) 76.7 kg (169 lb 1.5 oz) 76.8 kg (169 lb 5 oz)  14. H/o anemia: Monitor H/H as now on Eliquis. Monitor for any signs of bleeding.   Hb 10.8 on 6/7  Labs ordered for tomorrow  Cont to monitor 15. Hypoalbuminemia  Supplement initiated 6/7 16. Hypokalemia  K+ 3.2 on 6/8  Supplementing x4 days  Labs ordered for  tomorrow  LOS (Days) 4 A FACE TO FACE EVALUATION WAS PERFORMED  Leni Pankonin Lorie Phenix 12/27/2016 8:42 AM

## 2016-12-28 ENCOUNTER — Inpatient Hospital Stay (HOSPITAL_COMMUNITY): Payer: Medicare Other | Admitting: Occupational Therapy

## 2016-12-28 ENCOUNTER — Inpatient Hospital Stay (HOSPITAL_COMMUNITY): Payer: Medicare Other | Admitting: Speech Pathology

## 2016-12-28 ENCOUNTER — Inpatient Hospital Stay (HOSPITAL_COMMUNITY): Payer: Medicare Other | Admitting: Physical Therapy

## 2016-12-28 DIAGNOSIS — R4701 Aphasia: Secondary | ICD-10-CM

## 2016-12-28 DIAGNOSIS — I69351 Hemiplegia and hemiparesis following cerebral infarction affecting right dominant side: Secondary | ICD-10-CM

## 2016-12-28 LAB — CBC WITH DIFFERENTIAL/PLATELET
BASOS PCT: 0 %
Basophils Absolute: 0 10*3/uL (ref 0.0–0.1)
EOS ABS: 0.2 10*3/uL (ref 0.0–0.7)
Eosinophils Relative: 2 %
HCT: 39 % (ref 36.0–46.0)
HEMOGLOBIN: 12.2 g/dL (ref 12.0–15.0)
Lymphocytes Relative: 23 %
Lymphs Abs: 2.4 10*3/uL (ref 0.7–4.0)
MCH: 28.8 pg (ref 26.0–34.0)
MCHC: 31.3 g/dL (ref 30.0–36.0)
MCV: 92.2 fL (ref 78.0–100.0)
MONOS PCT: 6 %
Monocytes Absolute: 0.6 10*3/uL (ref 0.1–1.0)
NEUTROS PCT: 69 %
Neutro Abs: 7.4 10*3/uL (ref 1.7–7.7)
Platelets: 275 10*3/uL (ref 150–400)
RBC: 4.23 MIL/uL (ref 3.87–5.11)
RDW: 14.9 % (ref 11.5–15.5)
WBC: 10.7 10*3/uL — AB (ref 4.0–10.5)

## 2016-12-28 LAB — BASIC METABOLIC PANEL
Anion gap: 12 (ref 5–15)
BUN: 21 mg/dL — ABNORMAL HIGH (ref 6–20)
CALCIUM: 9.1 mg/dL (ref 8.9–10.3)
CO2: 24 mmol/L (ref 22–32)
CREATININE: 1.25 mg/dL — AB (ref 0.44–1.00)
Chloride: 104 mmol/L (ref 101–111)
GFR, EST AFRICAN AMERICAN: 47 mL/min — AB (ref >60)
GFR, EST NON AFRICAN AMERICAN: 40 mL/min — AB (ref >60)
Glucose, Bld: 130 mg/dL — ABNORMAL HIGH (ref 65–99)
Potassium: 3.3 mmol/L — ABNORMAL LOW (ref 3.5–5.1)
SODIUM: 140 mmol/L (ref 135–145)

## 2016-12-28 NOTE — Consult Note (Signed)
Neuropsychological Consultation   Patient:   Valerie Mcclure   DOB:   [birth year]  MR Number:  [id number]  Location:  [facility] [id number] [place] [address] Dept: [place]: [phone]           Date of Service:   02/27/2017  Start Time:   8 AM End Time:   9 AM  Provider/Observer:  Ilean Skill, Psy.D.       Clinical Neuropsychologist       Billing Code/Service: 48270 4 Units  Chief Complaint:    Valerie Mcclure has been dealing with a left MCA infarct and left hemispheric hemorrhage. The patient likely was dealing with significant anxiety and some significant family discord and psychosocial stressors before this event and these have persisted. The neuropsychological consultation was requested to work on coping skills and strategies around some of the stressors associated with her CVA as well as pre-existing stressors and anxiety.  Reason for Service:  Valerie Mcclure is a 76 year old female with history of hyperlipidemia who fell from bed on 5/13 and presented left facial weakness and slurred speech and right sided weakness.  Below is the HPI form the current admission.  Valerie Mcclure a 76 y.o.femalewith history of chronic abdominal and LBP, CKD, hyperlipidemia who fell out of bed on 5/13 am and was found to have left facial weakness with slurred speech and right sided weakness. History taken form chart review and daughter. Work up revealed hyperdense L-MCA sign with left M1 occlusion due to acute thrombus or embolus with significant penumbra left temporoparietal lobe and irregular non-calcified plaque L-ICA. She underwent cerebral angio with complete revascularization of L-MCA with reperfusion with hemorrhagic transformation of left temporal infarct and minimal SDH. MRI brain revealed L-MCA infarct with diffusion restriction left temporal lobe, superimposed  5.5 cm intra-axial hemorrhage centered at left basal ganglia with associated left hemisphere edema, effaced left lateral ventricle with trace IVH and occasional small foci of restriction in left thalamus and anterior left occipital lobe. Dr. Erlinda Hong felt that stroke embolic due to unknown source and to start ASA on 5/24 for secondary stroke prevention. There was question of new onset A fib but Dr. Acie Fredrickson felt that patient with multifocal PACs and to have 30 day loop recorder placed at discharge. She was started on D1, honey by teaspoon due to dysphagia. Patient with resultant right sided weakness with right lean, right inattention, aphasia, motor apraxia and DOE. CIR was recommended for follow up therapy.   She was admitted to CIR on 12/03/16 and was maintained on ASA for secondary stroke prevention. She was started on Bactrim for E coli UTI and renal status has been monitored serially and has been stable.  Blood pressures were noted to be labile and Avapro was titrated upwards.  Pain has been reasonable controlled on MS contin bid. Her diet was advanced to dysphagia 2, nectar liquids with water protocol on 5/22. Baclofen was added briefly to help manage spasticity but was discontinued due to sedation. Therapy has been focused on right inattention as well as weight bearing RLE. She has required cues for sequencing ADL tasks as well as mobility. She required min to mod assist with ADL tasks and mobility. She required supervision with cues to maintain aspiration precautions and functional communication was limited due to global aphasia with apraxia. On 6/2, she developed somnolence with hypoxia. She responded to multiple doses of narcan  and stat CT head reviewed, showing improvement in midline shift. She developed agitation post narcan administration and required transfer to ICU. She was started on narcan drip for acute encephalopathy due to narcotic overdose. Mentation improved and narcan drip discontinued by 6/3.  Work up negative and diet resumed. She developed atrial flutter with variable AV block and was started on metoprolol for rate control. Repeat CT 6/2 head showed near resolution of hematoma.  Dr. Erlinda Hong recommended resuming Eliquis with  close neuro monitoring and stat CT head prn acute changes to rule out ICH. Therapy resumed and patient was cleared to resume her rehab course.   Current Status:  The patient is continuing with expressive aphasia (non-fluent aphasia).  She has weakness in right arm/hand and right leg.  Some facial weakness.  She appears to have good understanding and awareness of what it is told to her and the patient appears to be well oriented. There are some issues of anxiety and stress with the patient.    Reliability of Information: Information is derived from 1 hour face-to-face clinical interview with the patient as well as review of available medical records and consultation with treatment team members.  Behavioral Observation: Valerie Mcclure  presents as a 76 y.o.-year-old Right Caucasian Female who appeared her stated age. her dress was Appropriate and she was Well Groomed and her manners were Appropriate to the situation.  her participation was indicative of Appropriate and Attentive behaviors.  There were physical disabilities noted.  she displayed an appropriate level of cooperation and motivation.     Interactions:    Active Appropriate and Attentive  Attention:   within normal limits and attention span and concentration were age appropriate  Memory:   within normal limits; recent and remote memory intact  Visuo-spatial:  not examined  Speech (Volume):  normal  Speech:   non-fluent aphasia; non-fluent aphasia  Thought Process:  Coherent and Relevant  Though Content:  WNL; not suicidal  Orientation:   person, place, time/date and  situation  Judgment:   Fair  Planning:   Fair  Affect:    Anxious  Mood:    Anxious  Insight:   Fair  Intelligence:   normal  Marital Status/Living: The patient is married to her second husband and there are some challenges in family discord between her children.  Medical History:   Past Medical History:  Diagnosis Date  . Blood transfusion without reported diagnosis   . Chronic low back pain   . Hyperlipidemia   . Osteopenia   . Unspecified essential hypertension   . Unspecified hypothyroidism             Psychiatric History:  The patient does have some prior history of dealing with some anxiety symptoms.  Family Med/Psych History:  Family History  Problem Relation Age of Onset  . Heart disease Father 20       MI age 31s  . Lung cancer Brother 4  . Colon cancer Neg Hx   . Esophageal cancer Neg Hx   . Rectal cancer Neg Hx   . Stomach cancer Neg Hx     Risk of Suicide/Violence: virtually non-existent   Impression/DX:  Valerie Mcclure is a 76 year old female who had a left hemispheric CVA and hemorrhage. The patient has continued to have expressive language deficits and hemiparesis of her right side. The patient has described a lot of stress within the family and the family themselves have been dealing with some internal discord.  Disposition/Plan:  I will see the patient again later this week if possible and help work on coping skills and strategies around her underlying anxiety symptoms.          Electronically Signed   _______________________ Ilean Skill, Psy.D.

## 2016-12-28 NOTE — Progress Notes (Signed)
Occupational Therapy Session Note  Patient Details  Name: Valerie Mcclure MRN: [id number] Date of Birth: April 04, 1941  Today's Date: 12/28/2016 OT Individual Time: 0930-1030 and 1430-1500 OT Individual Time Calculation (min): 60 min and 30 min   Short Term Goals: Week 1:  OT Short Term Goal 1 (Week 1): Pt will complete bathing with mod assist at sit > stand level  OT Short Term Goal 2 (Week 1): pt will don shirt with min assist OT Short Term Goal 3 (Week 1): pt will don pants with mod assist at sit > stand level OT Short Term Goal 4 (Week 1): Pt will complete toilet transfers with mod assist OT Short Term Goal 5 (Week 1): Pt will locate items on Rt to complete self-care tasks with min cues  Skilled Therapeutic Interventions/Progress Updates:    1) Treatment session with focus on trunk control in sitting and standing and Rt attention during self-care tasks.  Pt received in bed complaining about some nursing staff changing her and then not coming back (as pt in bed with towel around her waist), due to aphasia therapist unable to comprehend entire story.  Completed bed mobility and stand pivot transfer with mod assist, pt with improved trunk control with decreased posterior lean.  Pt demonstrating improved Rt attention during self-care tasks with scanning to Rt to wash arm and improved ability to thread bra strap over Rt arm without assistance/cues.  Continues to require multimodal cues for sequencing with dressing tasks.  Improved sitting balance allowing for pt to cross leg over opposite knee to assist with LB bathing and dressing.  Min assist for standing when attempting to pull pants over hips, however unable to weight shift and reach enough to adjust pants over Rt hip.  Incorporate scanning to Rt on sink while engaging in grooming tasks with overall improved Rt visual attention.  2) Treatment session with focus on attention to RUE and functional use during table top task.  Engaged in table top  task with colored tiles, utilizing hand over hand to slide tiles across table to match by colors.  Pt continues to have decreased awareness of RUE positioning, requiring cues to visually scan and attend to RUE during tasks.  Noted improvements with spontaneous speech, however with demand pt continues to demonstrate increased aphasia.  Therapy Documentation Precautions:  Precautions Precautions: Fall Precaution Comments: Rt hemiparesis, Rt inattention Restrictions Weight Bearing Restrictions: No General:   Vital Signs: Therapy Vitals Temp: 97.5 F (36.4 C) Temp Source: Oral Pulse Rate: 74 Resp: 18 BP: 130/60 Patient Position (if appropriate): Sitting Oxygen Therapy SpO2: 99 % O2 Device: Not Delivered Pain:  Pt with no c/o pain  See Function Navigator for Current Functional Status.   Therapy/Group: Individual Therapy  Simonne Come 12/28/2016, 12:23 PM

## 2016-12-28 NOTE — Progress Notes (Addendum)
Physical Therapy Note  Patient Details  Name: Valerie Mcclure MRN: [id number] Date of Birth: [birth year] Today's Date: 12/28/2016    Time: 1100-1115 15 minutes  1:1 No c/o pain.  Session focused on gait training without AD on carpet to simulate home environment.  Pt performed gait 50' x 4 with min A for straight line, mod A for turning to sit. Pt requires assist with wt shifts and tactile cues for Rt hip activation to decrease trendelenberg gait.  Pt requires max cuing for safety with turning to sit as she frequently undershoots and almost misses her chair.    Time 2: 1300-1341 41 minutes  1:1 No c/o pain.  Gait training with SBQC, pt unable to correctly sequence and frequently holds cane in the air, continues to require mod A for gait due to Rt LE weakness, manual facilitation for wt shifts.  Standing side stepping with focus on Rt LE strength and control with pt requiring mod A for balance and mod cuing for coordination.  Standing ball kick with noticeable apraxia, difficulty coordinating kicking with bilat LEs.  Pt improves with tactile cues and increased proprioceptive input in Rt LE.  Mod A for balance with standing ball kick.  Standing ball tap with mod A for balance, improving coordination with UEs with repetition.  Pt able to use Rt UE to catch ball x 3 attempts.  Pt requires frequent rest breaks due to fatigue but is improving balance reactions throughout session. Esaiah Wanless 12/28/2016, 11:17 AM

## 2016-12-28 NOTE — Progress Notes (Signed)
Speech Language Pathology Daily Session Note  Patient Details  Name: Valerie Mcclure MRN: [id number] Date of Birth: [birth year]  Today's Date: 12/28/2016 SLP Individual Time: 1115-1200 SLP Individual Time Calculation (min): 45 min  Short Term Goals: Week 1: SLP Short Term Goal 1 (Week 1): Pt will participate in repeat MBS to determine safest diet consistencies.   SLP Short Term Goal 2 (Week 1): Pt will recognize and correct verbal errors with mod assist multimodal cues.  SLP Short Term Goal 3 (Week 1): Pt will produce accurate phrase level expression during functional tasks for >75% of opportunities with mod assist verbal cues.   SLP Short Term Goal 4 (Week 1): Pt will follow multi-unit commands to complete functional tasks with mod assist verbal cues.    Skilled Therapeutic Interventions: Skilled treatment session focused on speech and dysphagia goals. SLP facilitated session by providing Mod A verbal and phonemic cues while naming functional items and Max A multimodal cues for phrase level completion while comparing/contrasting 2 familiar items. However, patient's speech accuracy improved during an informal conversation initiated by patient. SLP also facilitated session by providing Mod A verbal cues for accurate use of chin tuck with thin liquids throughout lunch meal of Dys. 2 textures with thin liquids. Patient with minimal PO intake but did not demonstrate any overt s/s of aspiration. Patient completed oral care at end of meal with a moderate amount of oral residue expectorated. Patient left upright in wheelchair with all needs within reach and quick release belt in place. Continue with current plan of care.      Function:  Eating Eating   Modified Consistency Diet: Yes Eating Assist Level: Set up assist for;Supervision or verbal cues;More than reasonable amount of time   Eating Set Up Assist For: Opening containers;Cutting food       Cognition Comprehension Comprehension assist  level: Understands basic 75 - 89% of the time/ requires cueing 10 - 24% of the time  Expression   Expression assist level: Expresses basic 50 - 74% of the time/requires cueing 25 - 49% of the time. Needs to repeat parts of sentences.  Social Interaction Social Interaction assist level: Interacts appropriately 50 - 74% of the time - May be physically or verbally inappropriate.  Problem Solving Problem solving assist level: Solves basic 25 - 49% of the time - needs direction more than half the time to initiate, plan or complete simple activities  Memory Memory assist level: Recognizes or recalls 25 - 49% of the time/requires cueing 50 - 75% of the time    Pain No/Denies Pain   Therapy/Group: Individual Therapy  Gedalia Mcmillon 12/28/2016, 12:48 PM

## 2016-12-28 NOTE — Progress Notes (Signed)
[place] PHYSICAL MEDICINE & REHABILITATION     PROGRESS NOTE  Subjective/Complaints:  Pt seen laying in bed this AM.  She slept well overnight.  She continues to have expressive aphasia, but realizes her mistakes and attempts to correct.  ROS: Denies CP, SOB, N/V/D.  Objective: Vital Signs: Blood pressure 130/70, pulse 72, temperature 98 F (36.7 C), temperature source Oral, resp. rate 16, height 5\' 3"  (1.6 m), weight 77 kg (169 lb 12.1 oz), SpO2 99 %. No results found. No results for input(s): WBC, HGB, HCT, PLT in the last 72 hours.  Recent Labs  12/25/16 0958  K 3.2*   CBG (last 3)   Recent Labs  12/26/16 1715  GLUCAP 138*    Wt Readings from Last 3 Encounters:  12/28/16 77 kg (169 lb 12.1 oz)  12/23/16 76.8 kg (169 lb 6.4 oz)  12/16/16 72.6 kg (160 lb)    Physical Exam:  BP 130/70 (BP Location: Left Arm)   Pulse 72   Temp 98 F (36.7 C) (Oral)   Resp 16   Ht 5\' 3"  (1.6 m)   Wt 77 kg (169 lb 12.1 oz)   SpO2 99%   BMI 30.07 kg/m  Constitutional: She appears well-developed and well-nourished. NAD. HENT: Normocephalic and atraumatic.  Eyes: EOMI. No discharge.  Cardiovascular: RRR.  No JVD. Respiratory: Effort normal and breath sounds normal.  GI: Soft. Bowel sounds are normal.  Musculoskeletal: She exhibits no edema or tenderness.  Neurological: She is alert and oriented.  Right facial weakness with expressive>> receptive aphasia. Able to follow simple motor commands.  Motor: RUE: 4-/5 proximal to distal (stable) RLE: 4-/5 proximal to distal LUE/LLE: 5/5  Skin: Skin is warm and dry.  Psychiatric: She has a normal mood and affect. Her behavior is normal.   Assessment/Plan: 1. Functional deficits secondary to left MCA infarct which require 3+ hours per day of interdisciplinary therapy in a comprehensive inpatient rehab setting. Physiatrist is providing close team supervision and 24 hour management of active medical problems listed below. Physiatrist  and rehab team continue to assess barriers to discharge/monitor patient progress toward functional and medical goals.  Function:  Bathing Bathing position Bathing activity did not occur: N/A (stated she'd bathed already) Position: Wheelchair/chair at sink  Bathing parts Body parts bathed by patient: Chest, Abdomen, Right upper leg, Left upper leg Body parts bathed by helper: Right arm, Left arm, Front perineal area, Buttocks, Right lower leg, Left lower leg, Back  Bathing assist Assist Level:  (Max assist)      Upper Body Dressing/Undressing Upper body dressing Upper body dressing/undressing activity did not occur: N/A (stated she'd already dressed (only donned socks and shoes)) What is the patient wearing?: Bra, Pull over shirt/dress   Bra - Perfomed by helper: Thread/unthread right bra strap, Thread/unthread left bra strap, Hook/unhook bra (pull down sports bra) Pull over shirt/dress - Perfomed by patient: Put head through opening Pull over shirt/dress - Perfomed by helper: Thread/unthread right sleeve, Thread/unthread left sleeve, Pull shirt over trunk        Upper body assist Assist Level:  (Max assist)      Lower Body Dressing/Undressing Lower body dressing   What is the patient wearing?: Socks, Shoes       Pants- Performed by helper: Thread/unthread right pants leg, Thread/unthread left pants leg, Pull pants up/down   Non-skid slipper socks- Performed by helper: Don/doff right sock, Don/doff left sock   Socks - Performed by helper: Don/doff right sock, Don/doff left  sock   Shoes - Performed by helper: Don/doff right shoe, Don/doff left shoe, Fasten right, Fasten left          Lower body assist Assist for lower body dressing:  (Total assist)      Toileting Toileting Toileting activity did not occur: N/A        Toileting assist     Transfers Chair/bed transfer   Chair/bed transfer method: Stand pivot Chair/bed transfer assist level: Touching or steadying  assistance (Pt > 75%)       Locomotion Ambulation     Max distance: 85 ft Assist level: Moderate assist (Pt 50 - 74%)   Wheelchair     Max wheelchair distance:  (100 ft) Assist Level: Moderate assistance (Pt 50 - 74%)  Cognition Comprehension Comprehension assist level: Understands basic 75 - 89% of the time/ requires cueing 10 - 24% of the time  Expression Expression assist level: Expresses basic 25 - 49% of the time/requires cueing 50 - 75% of the time. Uses single words/gestures.  Social Interaction Social Interaction assist level: Interacts appropriately with others with medication or extra time (anti-anxiety, antidepressant).  Problem Solving Problem solving assist level: Solves basic less than 25% of the time - needs direction nearly all the time or does not effectively solve problems and may need a restraint for safety  Memory Memory assist level: Recognizes or recalls 25 - 49% of the time/requires cueing 50 - 75% of the time    Medical Problem List and Plan: 1.  Right hemiparesis, global aphasia secondary to left MCA infarct.  Cont CIR 2.  DVT Prophylaxis/Anticoagulation: Pharmaceutical: Other (comment)--Eliquis. Monitor for any new neurological changes.  3. Pain Management: tylenol prn for now 4. Mood: No signs of distress--continue prozac. LCSW to follow for evaluation and support.  5. Neuropsych: This patient is not fully capable of making decisions on her own behalf. 6. Skin/Wound Care: routine pressure measures 7. Fluids/Electrolytes/Nutrition: Monitor I/Os.   D2 nectar, continue water protocol in between meals.  8. AFib: Monitor HR bid--continue metoprolol and Eliquis 9. CKD: ?Baseline SCR 1.3-1.4. Improved with hydration  Cr 1.09 on 6/7  Cont to monitor.    Labs pending 10. Encephalopathy: Resolved. Due to narcotic accumulation. 11. HTN: monitor BP bid.   Continue Lasix and metoprolol.   Avapro increased to 225 on 6/8, increased to 300 on 6/10 12. Prediabetes:  Educate on diet as cognition improves  Overall controlled 6/11 13. Diastolic Heart failure: On lasix, metoprolol, avapro and Zocor  Filed Weights   12/26/16 0513 12/27/16 0527 12/28/16 0500  Weight: 76.7 kg (169 lb 1.5 oz) 76.8 kg (169 lb 5 oz) 77 kg (169 lb 12.1 oz)  14. H/o anemia: Resolved  Monitor H/H as now on Eliquis. Monitor for any signs of bleeding.   Hb 12.2 on 6/11  Cont to monitor 15. Hypoalbuminemia  Supplement initiated 6/7 16. Hypokalemia  K+ 3.2 on 6/8  Supplementing x4 days  Labs pending  LOS (Days) 5 A FACE TO FACE EVALUATION WAS PERFORMED  Landy Dunnavant Lorie Phenix 12/28/2016 9:17 AM

## 2016-12-29 ENCOUNTER — Inpatient Hospital Stay (HOSPITAL_COMMUNITY): Payer: Medicare Other | Admitting: Physical Therapy

## 2016-12-29 ENCOUNTER — Inpatient Hospital Stay (HOSPITAL_COMMUNITY): Payer: Medicare Other | Admitting: Occupational Therapy

## 2016-12-29 ENCOUNTER — Inpatient Hospital Stay (HOSPITAL_COMMUNITY): Payer: Medicare Other | Admitting: Speech Pathology

## 2016-12-29 MED ORDER — POTASSIUM CHLORIDE IN NACL 20-0.9 MEQ/L-% IV SOLN
INTRAVENOUS | Status: DC
  Filled 2016-12-29: qty 1000

## 2016-12-29 MED ORDER — SODIUM CHLORIDE 0.9 % IV SOLN: INTRAVENOUS | Status: DC

## 2016-12-29 MED ORDER — POTASSIUM CHLORIDE IN NACL 20-0.9 MEQ/L-% IV SOLN
INTRAVENOUS | Status: DC
  Administered 2016-12-29 – 2016-12-30 (×2): via INTRAVENOUS
  Administered 2016-12-31 – 2017-01-01 (×2): 75 mL via INTRAVENOUS
  Administered 2017-01-02 – 2017-01-03 (×2): via INTRAVENOUS
  Filled 2016-12-29 (×6): qty 1000

## 2016-12-29 MED ORDER — POTASSIUM CHLORIDE CRYS ER 20 MEQ PO TBCR
20.0000 meq | EXTENDED_RELEASE_TABLET | Freq: Every day | ORAL | Status: DC
  Administered 2016-12-29 – 2017-01-07 (×10): 20 meq via ORAL
  Filled 2016-12-29 (×10): qty 1

## 2016-12-29 NOTE — Discharge Instructions (Addendum)
Inpatient Rehab Discharge Instructions  Valerie Mcclure Discharge date and time:    Activities/Precautions/ Functional Status: Activity: no lifting, driving, or strenuous exercise for till cleared by MD Diet: Mechanical soft Wound Care: none needed   Functional status:  ___ No restrictions     ___ Walk up steps independently _X__ 24/7 supervision/assistance   ___ Walk up steps with assistance ___ Intermittent supervision/assistance  ___ Bathe/dress independently ___ Walk with walker     _X__ Bathe/dress with assistance ___ Walk Independently    ___ Shower independently ___ Walk with assistance    ___ Shower with assistance _X__ No alcohol     ___ Return to work/school ________     COMMUNITY REFERRALS UPON DISCHARGE:    Home Health:   PT    OT     ST                     Agency:  [facility] Phone: [phone]   Medical Equipment/Items Ordered:  Wheelchair, cushion, quad cane, 3n1 commode and tub bench                                                      Agency/Supplier:  [facility] @ [phone]   GENERAL COMMUNITY RESOURCES FOR PATIENT/FAMILY:  Support Groups:  Stroke Support Group                                2nd Thursday or each month (no meetings June, July and Aug)                              3 - 4:00 in the [facility] Dayroom                              Contact:  Hollace Kinnier - Crew @ [phone]   Special Instructions:   STROKE/TIA DISCHARGE INSTRUCTIONS SMOKING Cigarette smoking nearly doubles your risk of having a stroke & is the single most alterable risk factor  If you smoke or have smoked in the last 12 months, you are advised to quit smoking for your health.  Most of the excess cardiovascular risk related to smoking disappears within a year of stopping.  Ask you doctor about anti-smoking medications  [facility] Quit Line: 1-800-QUIT NOW  Free Smoking Cessation Classes [phone]  CHOLESTEROL Know your levels; limit fat &  cholesterol in your diet  Lipid Panel     Component Value Date/Time   CHOL 162 11/30/2016 0329   TRIG 175 (H) 11/30/2016 0329   HDL 42 11/30/2016 0329   CHOLHDL 3.9 11/30/2016 0329   VLDL 35 11/30/2016 0329   LDLCALC 85 11/30/2016 0329      Many patients benefit from treatment even if their cholesterol is at goal.  Goal: Total Cholesterol (CHOL) less than 160  Goal:  Triglycerides (TRIG) less than 150  Goal:  HDL greater than 40  Goal:  LDL (LDLCALC) less than 100   BLOOD PRESSURE American Stroke Association blood pressure target is less that 120/80 mm/Hg  Your discharge blood pressure is:  BP: (!) 130/55  Monitor your blood pressure  Limit your  salt and alcohol intake  Many individuals will require more than one medication for high blood pressure  DIABETES (A1c is a blood sugar average for last 3 months) Goal HGBA1c is under 7% (HBGA1c is blood sugar average for last 3 months)  Diabetes: No known diagnosis of diabetes    Lab Results  Component Value Date   HGBA1C 5.8 (H) 11/30/2016     Your HGBA1c can be lowered with medications, healthy diet, and exercise.  Check your blood sugar as directed by your physician  Call your physician if you experience unexplained or low blood sugars.  PHYSICAL ACTIVITY/REHABILITATION Goal is 30 minutes at least 4 days per week  Activity: No driving, Therapies: See above Return to work: N/A  Activity decreases your risk of heart attack and stroke and makes your heart stronger.  It helps control your weight and blood pressure; helps you relax and can improve your mood.  Participate in a regular exercise program.  Talk with your doctor about the best form of exercise for you (dancing, walking, swimming, cycling).  DIET/WEIGHT Goal is to maintain a healthy weight  Your discharge diet is: DIET DYS 2 Room service appropriate? Yes with Assist; Fluid consistency: Thin liquids Your height is:  Height: 5\' 3"  (160 cm) Your current weight  is: Weight: 77.6 kg (171 lb) Your Body Mass Index (BMI) is:  BMI (Calculated): 30.2  Following the type of diet specifically designed for you will help prevent another stroke.  Your goal weight is:  141 lbs  Your goal Body Mass Index (BMI) is 19-24.  Healthy food habits can help reduce 3 risk factors for stroke:  High cholesterol, hypertension, and excess weight.  RESOURCES Stroke/Support Group:  Call [phone]   STROKE EDUCATION PROVIDED/REVIEWED AND GIVEN TO PATIENT Stroke warning signs and symptoms How to activate emergency medical system (call 911). Medications prescribed at discharge. Need for follow-up after discharge. Personal risk factors for stroke. Pneumonia vaccine given:  Flu vaccine given:  My questions have been answered, the writing is legible, and I understand these instructions.  I will adhere to these goals & educational materials that have been provided to me after my discharge from the hospital.      My questions have been answered and I understand these instructions. I will adhere to these goals and the provided educational materials after my discharge from the hospital.  Patient/Caregiver Signature _______________________________ Date __________  Clinician Signature _______________________________________ Date __________  Please bring this form and your medication list with you to all your follow-up doctor's appointments.     Information on my medicine - ELIQUIS (apixaban)  This medication education was reviewed with me or my healthcare representative as part of my discharge preparation.    Why was Eliquis prescribed for you? Eliquis was prescribed for you to reduce the risk of a blood clot forming that can cause a stroke if you have a medical condition called atrial fibrillation (a type of irregular heartbeat).  What do You need to know about Eliquis ? Take your Eliquis TWICE DAILY - one tablet in the morning and one tablet in the evening with  or without food. If you have difficulty swallowing the tablet whole please discuss with your pharmacist how to take the medication safely.  Take Eliquis exactly as prescribed by your doctor and DO NOT stop taking Eliquis without talking to the doctor who prescribed the medication.  Stopping may increase your risk of developing a stroke.  Refill your prescription before you run  out.  After discharge, you should have regular check-up appointments with your healthcare provider that is prescribing your Eliquis.  In the future your dose may need to be changed if your kidney function or weight changes by a significant amount or as you get older.  What do you do if you miss a dose? If you miss a dose, take it as soon as you remember on the same day and resume taking twice daily.  Do not take more than one dose of ELIQUIS at the same time to make up a missed dose.  Important Safety Information A possible side effect of Eliquis is bleeding. You should call your healthcare provider right away if you experience any of the following: ? Bleeding from an injury or your nose that does not stop. ? Unusual colored urine (red or dark brown) or unusual colored stools (red or black). ? Unusual bruising for unknown reasons. ? A serious fall or if you hit your head (even if there is no bleeding).  Some medicines may interact with Eliquis and might increase your risk of bleeding or clotting while on Eliquis. To help avoid this, consult your healthcare provider or pharmacist prior to using any new prescription or non-prescription medications, including herbals, vitamins, non-steroidal anti-inflammatory drugs (NSAIDs) and supplements.  This website has more information on Eliquis (apixaban): http://[email or web address].com/eliquis/home

## 2016-12-29 NOTE — Progress Notes (Signed)
[place] PHYSICAL MEDICINE & REHABILITATION     PROGRESS NOTE  Subjective/Complaints:  Pt seen laying in bed this AM.  She slept well overnight.  She notes improvement in strength.   ROS: Denies CP, SOB, N/V/D.  Objective: Vital Signs: Blood pressure (!) 145/68, pulse 75, temperature 97.8 F (36.6 C), temperature source Oral, resp. rate 18, height 5\' 3"  (1.6 m), weight 77.6 kg (171 lb), SpO2 100 %. No results found.  Recent Labs  12/28/16 0805  WBC 10.7*  HGB 12.2  HCT 39.0  PLT 275    Recent Labs  12/28/16 0805  NA 140  K 3.3*  CL 104  GLUCOSE 130*  BUN 21*  CREATININE 1.25*  CALCIUM 9.1   CBG (last 3)   Recent Labs  12/26/16 1715  GLUCAP 138*    Wt Readings from Last 3 Encounters:  12/29/16 77.6 kg (171 lb)  12/23/16 76.8 kg (169 lb 6.4 oz)  12/16/16 72.6 kg (160 lb)    Physical Exam:  BP (!) 145/68 (BP Location: Left Arm)   Pulse 75   Temp 97.8 F (36.6 C) (Oral)   Resp 18   Ht 5\' 3"  (1.6 m)   Wt 77.6 kg (171 lb)   SpO2 100%   BMI 30.29 kg/m  Constitutional: She appears well-developed and well-nourished. NAD. HENT: Normocephalic and atraumatic.  Eyes: EOMI. No discharge.  Cardiovascular: RRR.  No JVD. Respiratory: Effort normal and breath sounds normal.  GI: Soft. Bowel sounds are normal.  Musculoskeletal: She exhibits no edema or tenderness.  Neurological: She is alert and oriented.  Right facial weakness with expressive>> receptive aphasia, improving Able to follow simple motor commands.  Motor: RUE: 4-/5 proximal to distal (improving) RLE: 4-/5 proximal to distal (improving) LUE/LLE: 5/5  Skin: Skin is warm and dry.  Psychiatric: She has a normal mood and affect. Her behavior is normal.   Assessment/Plan: 1. Functional deficits secondary to left MCA infarct which require 3+ hours per day of interdisciplinary therapy in a comprehensive inpatient rehab setting. Physiatrist is providing close team supervision and 24 hour management of  active medical problems listed below. Physiatrist and rehab team continue to assess barriers to discharge/monitor patient progress toward functional and medical goals.  Function:  Bathing Bathing position Bathing activity did not occur: N/A (stated she'd bathed already) Position: Wheelchair/chair at sink  Bathing parts Body parts bathed by patient: Chest, Abdomen, Right upper leg, Left upper leg, Right arm, Front perineal area, Right lower leg Body parts bathed by helper: Left arm, Buttocks, Left lower leg, Back  Bathing assist Assist Level:  (Mod assist)      Upper Body Dressing/Undressing Upper body dressing Upper body dressing/undressing activity did not occur: N/A (stated she'd already dressed (only donned socks and shoes)) What is the patient wearing?: Bra, Pull over shirt/dress Bra - Perfomed by patient: Thread/unthread right bra strap Bra - Perfomed by helper: Thread/unthread left bra strap, Hook/unhook bra (pull down sports bra) Pull over shirt/dress - Perfomed by patient: Put head through opening, Pull shirt over trunk Pull over shirt/dress - Perfomed by helper: Thread/unthread right sleeve, Thread/unthread left sleeve        Upper body assist Assist Level:  (Max assist)      Lower Body Dressing/Undressing Lower body dressing   What is the patient wearing?: Pants, Socks, Shoes     Pants- Performed by patient: Thread/unthread right pants leg Pants- Performed by helper: Thread/unthread left pants leg, Pull pants up/down   Non-skid slipper socks-  Performed by helper: Don/doff right sock, Don/doff left sock   Socks - Performed by helper: Don/doff right sock, Don/doff left sock Shoes - Performed by patient: Fasten right, Fasten left (velcro strap shoes) Shoes - Performed by helper: Don/doff right shoe, Don/doff left shoe          Lower body assist Assist for lower body dressing:  (Max assist)      Toileting Toileting Toileting activity did not occur: N/A    Toileting steps completed by helper: Adjust clothing prior to toileting, Performs perineal hygiene, Adjust clothing after toileting (per Dorian Heckle, NT)    Toileting assist     Transfers Chair/bed transfer   Chair/bed transfer method: Stand pivot Chair/bed transfer assist level: Moderate assist (Pt 50 - 74%/lift or lower)       Locomotion Ambulation     Max distance: 50 Assist level: Moderate assist (Pt 50 - 74%)   Wheelchair     Max wheelchair distance:  (100 ft) Assist Level: Moderate assistance (Pt 50 - 74%)  Cognition Comprehension Comprehension assist level: Understands basic 75 - 89% of the time/ requires cueing 10 - 24% of the time  Expression Expression assist level: Expresses basic 50 - 74% of the time/requires cueing 25 - 49% of the time. Needs to repeat parts of sentences.  Social Interaction Social Interaction assist level: Interacts appropriately 50 - 74% of the time - May be physically or verbally inappropriate.  Problem Solving Problem solving assist level: Solves basic 25 - 49% of the time - needs direction more than half the time to initiate, plan or complete simple activities  Memory Memory assist level: Recognizes or recalls 25 - 49% of the time/requires cueing 50 - 75% of the time    Medical Problem List and Plan: 1.  Right hemiparesis, global aphasia secondary to left MCA infarct.  Cont CIR 2.  DVT Prophylaxis/Anticoagulation: Pharmaceutical: Other (comment)--Eliquis. Monitor for any new neurological changes.  3. Pain Management: tylenol prn for now 4. Mood: No signs of distress--continue prozac. LCSW to follow for evaluation and support.  5. Neuropsych: This patient is not fully capable of making decisions on her own behalf. 6. Skin/Wound Care: routine pressure measures 7. Fluids/Electrolytes/Nutrition: Monitor I/Os.   Now D2 thins.  8. AFib: Monitor HR bid--continue metoprolol and Eliquis 9. CKD ?III: ?Baseline SCR 1.3-1.4. Improved with  hydration  Cr 1.25 on 6/11  Cont to monitor.    IVF ordered x3 nights 10. Encephalopathy: Resolved. Due to narcotic accumulation. 11. HTN: monitor BP bid.   Continue Lasix and metoprolol.   Avapro increased to 225 on 6/8, increased to 300 on 6/10  Relatively controlled 6/12 12. Prediabetes: Educate on diet as cognition improves  Overall controlled 6/12 13. Diastolic Heart failure: On lasix, metoprolol, avapro and Zocor  Filed Weights   12/27/16 0527 12/28/16 0500 12/29/16 0500  Weight: 76.8 kg (169 lb 5 oz) 77 kg (169 lb 12.1 oz) 77.6 kg (171 lb)  14. H/o anemia: Resolved  Monitor H/H as now on Eliquis. Monitor for any signs of bleeding.   Hb 12.2 on 6/11  Cont to monitor 15. Hypoalbuminemia  Supplement initiated 6/7 16. Hypokalemia  K+ 3.3 on 6/11  Supplementing daily  LOS (Days) 6 A FACE TO FACE EVALUATION WAS PERFORMED  Pinkney Venard Lorie Phenix 12/29/2016 10:33 AM

## 2016-12-29 NOTE — Progress Notes (Signed)
Speech Language Pathology Daily Session Note  Patient Details  Name: Valerie Mcclure MRN: [id number] Date of Birth: Jan 24, 1941  Today's Date: 12/29/2016 SLP Individual Time: 1130-1200 SLP Individual Time Calculation (min): 30 min  Short Term Goals: Week 1: SLP Short Term Goal 1 (Week 1): Pt will participate in repeat MBS to determine safest diet consistencies.   SLP Short Term Goal 2 (Week 1): Pt will recognize and correct verbal errors with mod assist multimodal cues.  SLP Short Term Goal 3 (Week 1): Pt will produce accurate phrase level expression during functional tasks for >75% of opportunities with mod assist verbal cues.   SLP Short Term Goal 4 (Week 1): Pt will follow multi-unit commands to complete functional tasks with mod assist verbal cues.    Skilled Therapeutic Interventions:   Pt was seen for skilled ST targeting goals for communication and dysphagia.  SLP facilitated the session with skilled observations completed during presentations of thin liquids.  Pt needed max faded to mod assist verbal and demonstration cues to correctly sequence use of chin tuck.  No overt s/s of aspiration with thin liquids.  Pt was able to verbalize biographical information with max assist multimodal cues as she was often aware of verbal errors but needed cues to correct them.  Pt was left in wheelchair with quick release belt donned for safety and call bell within reach.  Continue per current plan of care.       Function:  Eating Eating   Modified Consistency Diet: Yes Eating Assist Level: Set up assist for;Supervision or verbal cues;More than reasonable amount of time           Cognition Comprehension Comprehension assist level: Understands basic 75 - 89% of the time/ requires cueing 10 - 24% of the time  Expression   Expression assist level: Expresses basic 50 - 74% of the time/requires cueing 25 - 49% of the time. Needs to repeat parts of sentences.  Social Interaction Social  Interaction assist level: Interacts appropriately 50 - 74% of the time - May be physically or verbally inappropriate.  Problem Solving Problem solving assist level: Solves basic 25 - 49% of the time - needs direction more than half the time to initiate, plan or complete simple activities  Memory Memory assist level: Recognizes or recalls 25 - 49% of the time/requires cueing 50 - 75% of the time    Pain Pain Assessment Pain Assessment: No/denies pain  Therapy/Group: Individual Therapy  Zelma Snead, Selinda Orion 12/29/2016, 12:00 PM

## 2016-12-29 NOTE — Progress Notes (Signed)
Occupational Therapy Session Note  Patient Details  Name: SANORA CUNANAN MRN: [id number] Date of Birth: 1941-04-09  Today's Date: 12/29/2016 OT Individual Time: 1030-1130 and 1300-1345 OT Individual Time Calculation (min): 60 min and 45 min   Short Term Goals: Week 1:  OT Short Term Goal 1 (Week 1): Pt will complete bathing with mod assist at sit > stand level  OT Short Term Goal 2 (Week 1): pt will don shirt with min assist OT Short Term Goal 3 (Week 1): pt will don pants with mod assist at sit > stand level OT Short Term Goal 4 (Week 1): Pt will complete toilet transfers with mod assist OT Short Term Goal 5 (Week 1): Pt will locate items on Rt to complete self-care tasks with min cues  Skilled Therapeutic Interventions/Progress Updates:    1) Treatment session with focus on RUE NMR and trunk control.  Pt received upright in w/c dressed from PT session.  Stand pivot transfer to therapy mat mod assist, continuing to demonstrate decreased proprioception in RLE with extensor tone.  Utilized hand skate and towel glides on table with focus on active shoulder flexion/protraction and horizontal adduction.  Utilized external targets to further promote increased reach and activation.  Pt benefits from additional input and quick, short cues to increase initiation of movement.  Engaged in visual scanning with pt reading large print words with 0 errors.  Increased challenge to identifying specific letters with pt demonstrating difficulty scanning to locate "d" confusing it with "b".    2) Treatment session with focus on sit > stand, Rt attention, and trunk control during transitional movements.  Utilized food labeled bean bags in standing with reaching to Rt to obtain and tossing into 3 categories.  Noted decreased extensor tone with sit > stand, however increased with trunk rotation and bending to retrieve items from Rt with pt not bending at knee and increased pointer flexion to compensate for  decreased knee flexion.  Therapist facilitated increased knee flexion during reaching task to decrease tone.  Pt with improved ability to read all bean bags, requiring min cues for clarity intermittently.    Therapy Documentation Precautions:  Precautions Precautions: Fall Precaution Comments: Rt hemiparesis, Rt inattention Restrictions Weight Bearing Restrictions: No General:   Vital Signs:  Pain: Pain Assessment Pain Assessment: No/denies pain  See Function Navigator for Current Functional Status.   Therapy/Group: Individual Therapy  Simonne Come 12/29/2016, 12:09 PM

## 2016-12-29 NOTE — Progress Notes (Signed)
Physical Therapy Note  Patient Details  Name: Valerie Mcclure MRN: [id number] Date of Birth: June 16, 1941 Today's Date: 12/29/2016    Time: 830-928 58 minutes  1:1 No c/o pain.   Sitting balance edge of bed for upper body dressing with total A for bra and max A for shirt, close supervision and mod cues for sitting balance.  Sit to stand for donning pants with min A for sit to stand, mod A standing balance.  Attempted gait with Rt AFO with mod A , increased supination with Rt AFO during 2 trials.  Gait without AFO with mod A with decreased supination, manual facilitation for wt shifts and max A for turning during gait.  Standing tap ups with Lt LE for Rt LE stance control with max A , manual facilitation for glute and knee activation. Standing ball kick with max A for balance, motor planning deficits making coordination for kicking difficult especially with fatigue.  Pt requires frequent rest breaks but is motivated during treatment.   Kimberle Stanfill 12/29/2016, 9:30 AM

## 2016-12-30 ENCOUNTER — Inpatient Hospital Stay (HOSPITAL_COMMUNITY): Payer: Medicare Other | Admitting: Occupational Therapy

## 2016-12-30 ENCOUNTER — Inpatient Hospital Stay (HOSPITAL_COMMUNITY): Payer: Medicare Other | Admitting: Speech Pathology

## 2016-12-30 ENCOUNTER — Encounter (HOSPITAL_COMMUNITY): Payer: Self-pay | Admitting: *Deleted

## 2016-12-30 ENCOUNTER — Inpatient Hospital Stay (HOSPITAL_COMMUNITY): Payer: Medicare Other | Admitting: Physical Therapy

## 2016-12-30 LAB — BASIC METABOLIC PANEL
Anion gap: 11 (ref 5–15)
BUN: 32 mg/dL — AB (ref 6–20)
CO2: 22 mmol/L (ref 22–32)
Calcium: 8.9 mg/dL (ref 8.9–10.3)
Chloride: 105 mmol/L (ref 101–111)
Creatinine, Ser: 1.15 mg/dL — ABNORMAL HIGH (ref 0.44–1.00)
GFR, EST AFRICAN AMERICAN: 52 mL/min — AB (ref >60)
GFR, EST NON AFRICAN AMERICAN: 45 mL/min — AB (ref >60)
Glucose, Bld: 98 mg/dL (ref 65–99)
Potassium: 3.8 mmol/L (ref 3.5–5.1)
SODIUM: 138 mmol/L (ref 135–145)

## 2016-12-30 NOTE — Progress Notes (Signed)
Speech Language Pathology Daily Session Note  Patient Details  Name: Valerie Mcclure MRN: [id number] Date of Birth: 09-20-1940  Today's Date: 12/30/2016 SLP Individual Time: 1030-1130 SLP Individual Time Calculation (min): 60 min  Short Term Goals: Week 1: SLP Short Term Goal 1 (Week 1): Pt will consume dys 2 textures and thin liquids with supervision verbal cues for use of swallowing precautions.   SLP Short Term Goal 1 - Progress (Week 1): Updated due to goal met SLP Short Term Goal 2 (Week 1): Pt will recognize and correct verbal errors with mod assist multimodal cues.  SLP Short Term Goal 3 (Week 1): Pt will produce accurate phrase level expression during functional tasks for >75% of opportunities with mod assist verbal cues.   SLP Short Term Goal 4 (Week 1): Pt will follow multi-unit commands to complete functional tasks with mod assist verbal cues.   SLP Short Term Goal 5 (Week 1): Pt will consume therapeutic trials of dys 3 textures with supervision verbal cues to clear residue from the oral cavity.    Skilled Therapeutic Interventions:  Pt was seen for skilled ST targeting goals for dysphagia and communication.  SLP facilitated the session with a trial snack of dys 3 textures to continue working towards diet progression.  While pt demonstrated effective clearance of graham cracker from the oral cavity, this was achieved by very small and carefully controlled bite sizes (which pt monitored herself).  As a result, trials were effectively modified by pt to dys 2 textures.  Pt continues to have diffuse oral residue of solids after meals; therefore, do not recommend diet advancement at this time.  Pt was able to return demonstration of a chin tuck with min assist verbal cues with no overt s/s of aspiration with thin liquids.  Therapist also facilitated the session with a verbal picture description task to address expression at the phrase level.  Pt could sequence 4 step picture cards with  mod-max assist to correct errors and needed mod-max assist sentence completion cues to describe objects and actions in pictures at the phrase level.  Pt was returned to room and left in wheelchair with quick release belt donned and call bell within reach.  Continue per current plan of care.       Function:  Eating Eating   Modified Consistency Diet: Yes Eating Assist Level: Set up assist for;Supervision or verbal cues   Eating Set Up Assist For: Opening containers       Cognition Comprehension Comprehension assist level: Understands basic 75 - 89% of the time/ requires cueing 10 - 24% of the time  Expression   Expression assist level: Expresses basic 50 - 74% of the time/requires cueing 25 - 49% of the time. Needs to repeat parts of sentences.  Social Interaction Social Interaction assist level: Interacts appropriately 75 - 89% of the time - Needs redirection for appropriate language or to initiate interaction.  Problem Solving Problem solving assist level: Solves basic 25 - 49% of the time - needs direction more than half the time to initiate, plan or complete simple activities  Memory Memory assist level: Recognizes or recalls 50 - 74% of the time/requires cueing 25 - 49% of the time    Pain Pain Assessment Pain Assessment: No/denies pain Pain Score: 0-No pain  Therapy/Group: Individual Therapy  Sadia Belfiore, Selinda Orion 12/30/2016, 11:27 AM

## 2016-12-30 NOTE — Progress Notes (Signed)
[place] PHYSICAL MEDICINE & REHABILITATION     PROGRESS NOTE  Subjective/Complaints:  Pt seen laying in bed this AM.  She slept well overnight.  She states she hoped to be discharged before Saturday.   ROS: Denies CP, SOB, N/V/D.  Objective: Vital Signs: Blood pressure (!) 126/50, pulse 65, temperature 98.6 F (37 C), temperature source Oral, resp. rate 16, height 5\' 3"  (1.6 m), weight 76.2 kg (168 lb 1.6 oz), SpO2 96 %. No results found.  Recent Labs  12/28/16 0805  WBC 10.7*  HGB 12.2  HCT 39.0  PLT 275    Recent Labs  12/28/16 0805 12/30/16 0407  NA 140 138  K 3.3* 3.8  CL 104 105  GLUCOSE 130* 98  BUN 21* 32*  CREATININE 1.25* 1.15*  CALCIUM 9.1 8.9   CBG (last 3)  No results for input(s): GLUCAP in the last 72 hours.  Wt Readings from Last 3 Encounters:  12/30/16 76.2 kg (168 lb 1.6 oz)  12/23/16 76.8 kg (169 lb 6.4 oz)  12/16/16 72.6 kg (160 lb)    Physical Exam:  BP (!) 126/50 (BP Location: Left Arm)   Pulse 65   Temp 98.6 F (37 C) (Oral)   Resp 16   Ht 5\' 3"  (1.6 m)   Wt 76.2 kg (168 lb 1.6 oz)   SpO2 96%   BMI 29.78 kg/m  Constitutional: She appears well-developed and well-nourished. NAD. HENT: Normocephalic and atraumatic.  Eyes: EOMI. No discharge.  Cardiovascular: RRR.  No JVD. Respiratory: Effort normal and breath sounds normal.  GI: Soft. Bowel sounds are normal.  Musculoskeletal: She exhibits no edema or tenderness.  Neurological: She is alert and oriented.  Right facial weakness with expressive>> receptive aphasia, continues to improve Able to follow simple motor commands.  Motor: RUE: 4-/5 proximal to distal (improving) RLE: 4-/5 proximal to distal (improving) LUE/LLE: 5/5  Skin: Skin is warm and dry.  Psychiatric: She has a normal mood and affect. Her behavior is normal.   Assessment/Plan: 1. Functional deficits secondary to left MCA infarct which require 3+ hours per day of interdisciplinary therapy in a comprehensive  inpatient rehab setting. Physiatrist is providing close team supervision and 24 hour management of active medical problems listed below. Physiatrist and rehab team continue to assess barriers to discharge/monitor patient progress toward functional and medical goals.  Function:  Bathing Bathing position Bathing activity did not occur: N/A (stated she'd bathed already) Position: Wheelchair/chair at sink  Bathing parts Body parts bathed by patient: Chest, Abdomen, Right upper leg, Left upper leg, Right arm, Front perineal area, Right lower leg Body parts bathed by helper: Left arm, Buttocks, Left lower leg, Back  Bathing assist Assist Level:  (Mod assist)      Upper Body Dressing/Undressing Upper body dressing Upper body dressing/undressing activity did not occur: N/A (stated she'd already dressed (only donned socks and shoes)) What is the patient wearing?: Bra, Pull over shirt/dress Bra - Perfomed by patient: Thread/unthread right bra strap Bra - Perfomed by helper: Thread/unthread left bra strap, Hook/unhook bra (pull down sports bra) Pull over shirt/dress - Perfomed by patient: Put head through opening, Pull shirt over trunk Pull over shirt/dress - Perfomed by helper: Thread/unthread right sleeve, Thread/unthread left sleeve        Upper body assist Assist Level:  (Max assist)      Lower Body Dressing/Undressing Lower body dressing   What is the patient wearing?: Pants, Socks, Shoes     Pants- Performed by patient:  Thread/unthread right pants leg Pants- Performed by helper: Thread/unthread left pants leg, Pull pants up/down   Non-skid slipper socks- Performed by helper: Don/doff right sock, Don/doff left sock   Socks - Performed by helper: Don/doff right sock, Don/doff left sock Shoes - Performed by patient: Fasten right, Fasten left (velcro strap shoes) Shoes - Performed by helper: Don/doff right shoe, Don/doff left shoe          Lower body assist Assist for lower body  dressing:  (Max assist)      Toileting Toileting Toileting activity did not occur: N/A   Toileting steps completed by helper: Adjust clothing prior to toileting, Performs perineal hygiene, Adjust clothing after toileting    Toileting assist     Transfers Chair/bed transfer   Chair/bed transfer method: Stand pivot Chair/bed transfer assist level: Moderate assist (Pt 50 - 74%/lift or lower)       Locomotion Ambulation     Max distance: 50 Assist level: Moderate assist (Pt 50 - 74%)   Wheelchair     Max wheelchair distance:  (100 ft) Assist Level: Moderate assistance (Pt 50 - 74%)  Cognition Comprehension Comprehension assist level: Understands basic 75 - 89% of the time/ requires cueing 10 - 24% of the time  Expression Expression assist level: Expresses basic 50 - 74% of the time/requires cueing 25 - 49% of the time. Needs to repeat parts of sentences.  Social Interaction Social Interaction assist level: Interacts appropriately 50 - 74% of the time - May be physically or verbally inappropriate.  Problem Solving Problem solving assist level: Solves basic 25 - 49% of the time - needs direction more than half the time to initiate, plan or complete simple activities  Memory Memory assist level: Recognizes or recalls 25 - 49% of the time/requires cueing 50 - 75% of the time    Medical Problem List and Plan: 1.  Right hemiparesis, global aphasia secondary to left MCA infarct.  Cont CIR 2.  DVT Prophylaxis/Anticoagulation: Pharmaceutical: Other (comment)--Eliquis. Monitor for any new neurological changes.  3. Pain Management: tylenol prn for now 4. Mood: No signs of distress--continue prozac. LCSW to follow for evaluation and support.  5. Neuropsych: This patient is not fully capable of making decisions on her own behalf. 6. Skin/Wound Care: routine pressure measures 7. Fluids/Electrolytes/Nutrition: Monitor I/Os.   Now D2 thins.  8. AFib: Monitor HR bid--continue metoprolol and  Eliquis 9. CKD ?III: ?Baseline SCR 1.3-1.4. Improved with hydration  Cr 1.15 on 6/13  Cont to monitor.    IVF ordered x3 nights (2 more) 10. Encephalopathy: Resolved. Due to narcotic accumulation. 11. HTN: monitor BP bid.   Continue Lasix and metoprolol.   Avapro increased to 225 on 6/8, increased to 300 on 6/10  Relatively controlled 6/13 12. Prediabetes: Educate on diet as cognition improves  Overall controlled 6/13 13. Diastolic Heart failure: On lasix, metoprolol, avapro and Zocor  Filed Weights   12/28/16 0500 12/29/16 0500 12/30/16 0450  Weight: 77 kg (169 lb 12.1 oz) 77.6 kg (171 lb) 76.2 kg (168 lb 1.6 oz)  14. H/o anemia: Resolved  Monitor H/H as now on Eliquis. Monitor for any signs of bleeding.   Hb 12.2 on 6/11  Cont to monitor 15. Hypoalbuminemia  Supplement initiated 6/7 16. Hypokalemia  K+ 3.8 on 6/13  Supplementing daily  LOS (Days) 7 A FACE TO FACE EVALUATION WAS PERFORMED  Ankit Lorie Phenix 12/30/2016 9:27 AM

## 2016-12-30 NOTE — Progress Notes (Signed)
Physical Therapy Session Note  Patient Details  Name: Valerie Mcclure MRN: [id number] Date of Birth: [birth year]  Today's Date: 12/30/2016 PT Individual Time: 0755-0910 PT Individual Time Calculation (min): 75 min    Skilled Therapeutic Interventions/Progress    Pt denies pain.  Pt found in bed today in clean depend with only night shift donned.  Prior to session, pt performed supine to sit and moderate assistance.  Sat edge of bed for dressing upper body with moderate assistance.  Due to strong posterior lean with attempting standing, pt had to return to bed and performed rolling with total A from PT to donn pants.  Pt then transferred bed to chair with min to mod assist with significant verbal cues to remember to bring trunk forwards during sit to stand.  Pt able to perform sit to stand and return to sit with min A with flexing at hips first.  Session also addressed deficits in standing balance and pt performed ring toss while working on weight shifting on R LE.  Pt ambulated approx 20 ft with no assistive device but with AFO with mod assist to stabilize R knee and to facilitate R sided weight shift.  Attempted ambulation without AFO, however, pt shows worse R sided weight shift without AFO.    AFO appears to be assisting in gait, however, is not fitting appropriately in shoe.  Attempted to call orthotist, however, unable to leave message.  Therapy Documentation Precautions:  Precautions Precautions: Fall Precaution Comments: Rt hemiparesis, Rt inattention Restrictions Weight Bearing Restrictions: No   See Function Navigator for Current Functional Status.   Therapy/Group: Individual Therapy  Verne Cove [name] 12/30/2016, 11:21 AM

## 2016-12-30 NOTE — Progress Notes (Signed)
Occupational Therapy Session Note  Patient Details  Name: Valerie Mcclure MRN: [id number] Date of Birth: 10-May-1941  Today's Date: 12/30/2016 OT Individual Time: 1117-3567 OT Individual Time Calculation (min): 55 min    Short Term Goals: Week 1:  OT Short Term Goal 1 (Week 1): Pt will complete bathing with mod assist at sit > stand level  OT Short Term Goal 2 (Week 1): pt will don shirt with min assist OT Short Term Goal 3 (Week 1): pt will don pants with mod assist at sit > stand level OT Short Term Goal 4 (Week 1): Pt will complete toilet transfers with mod assist OT Short Term Goal 5 (Week 1): Pt will locate items on Rt to complete self-care tasks with min cues  Skilled Therapeutic Interventions/Progress Updates:    Treatment session with focus on RUE NMR and functional use to integrate into self-care tasks.  Pt received on toilet with RN present. Pt declined bathing and dressing, reporting she dressed with PT.  Engaged in trunk control, trunk rotation, and RUE NMR in supine and sidelying.  Pt required min-mod assist with rolling on therapy mat.  Pt with improved shoulder ROM with gravity eliminated.  Manual facilitation to maintain RUE in 90 degree flexion during elbow flexion/extension against gravity with cues to touch her nose.  Pt requiring increased time and feedback for correct placement of RUE. In sidelying, utilized table top and hand skate with focus on shoulder protraction and motor control with improved movements.  In sitting, pt able to reach RUE to Lt shoulder to simulate UB bathing.  Continues to demonstrate difficulty with purposeful reaching for target with increased difficulty the longer she tries.  Short quick cues with pt demonstrating increased success.  Issued wash mitt to assist with bathing as pt with increased ROM but still lacking grasp strength.  Therapy Documentation Precautions:  Precautions Precautions: Fall Precaution Comments: Rt hemiparesis, Rt  inattention Restrictions Weight Bearing Restrictions: No General:   Vital Signs:  Pain: Pain Assessment Pain Assessment: No/denies pain Pain Score: 0-No pain ADL:   Vision   Perception    Praxis   Exercises:   Other Treatments:    See Function Navigator for Current Functional Status.   Therapy/Group: Individual Therapy  Simonne Come 12/30/2016, 10:28 AM

## 2016-12-31 ENCOUNTER — Inpatient Hospital Stay (HOSPITAL_COMMUNITY): Payer: Medicare Other | Admitting: Occupational Therapy

## 2016-12-31 ENCOUNTER — Inpatient Hospital Stay (HOSPITAL_COMMUNITY): Payer: Medicare Other | Admitting: Speech Pathology

## 2016-12-31 ENCOUNTER — Inpatient Hospital Stay (HOSPITAL_COMMUNITY): Payer: Medicare Other | Admitting: Physical Therapy

## 2016-12-31 MED ORDER — SODIUM CHLORIDE 0.9% FLUSH
10.0000 mL | Freq: Two times a day (BID) | INTRAVENOUS | Status: DC
Start: 2016-12-31 — End: 2017-01-06
  Administered 2016-12-31 – 2017-01-02 (×2): 10 mL

## 2016-12-31 MED ORDER — SODIUM CHLORIDE 0.9% FLUSH
10.0000 mL | INTRAVENOUS | Status: DC | PRN
Start: 2016-12-31 — End: 2017-01-06

## 2016-12-31 NOTE — Progress Notes (Signed)
Physical Therapy Note  Patient Details  Name: [name] MRN: [id number] Date of Birth: Feb 12, 1941 Today's Date: 12/31/2016    Time: 1300-1405 65 minutes  1:1 No c/o pain. Gait training with Rt AFO with pt still with significant external rotation, ankle supination, knee hyperextension. Pt able to gait multiple attempts of 50' with min A for straight line, mod A to turn.   Orthotist came and observed, states this is the best AFO for now, will reassess on Tuesday 6/19.  Standing balance with tapping with max A, sit to stand with adductor ball squeeze with improvement in midline posture.  Supine hip abduction with mod A, bridging with minimal hip clearance with tactile cues, lower trunk rotation with adduction with mod A especially for Lt rotation due to increased tone.  Pt fatigue at end of session, left to rest in w/c before next OT session.   Kaliegh Willadsen 12/31/2016, 2:48 PM

## 2016-12-31 NOTE — Progress Notes (Signed)
[facility]     PROGRESS NOTE  Subjective/Complaints:  Pt seen laying in bed this AM.  She slept states she slept "wonderful" overnight.  Her aphasia is improving and she states someone came by to adjust her AFO yesterday.  ROS: Denies CP, SOB, N/V/D.  Objective: Vital Signs: Blood pressure (!) 144/38, pulse 64, temperature 97.7 F (36.5 C), temperature source Oral, resp. rate 16, height 5\' 3"  (1.6 m), weight 75.4 kg (166 lb 3.2 oz), SpO2 100 %. No results found. No results for input(s): WBC, HGB, HCT, PLT in the last 72 hours.  Recent Labs  12/30/16 0407  NA 138  K 3.8  CL 105  GLUCOSE 98  BUN 32*  CREATININE 1.15*  CALCIUM 8.9   CBG (last 3)  No results for input(s): GLUCAP in the last 72 hours.  Wt Readings from Last 3 Encounters:  12/31/16 75.4 kg (166 lb 3.2 oz)  12/23/16 76.8 kg (169 lb 6.4 oz)  12/16/16 72.6 kg (160 lb)    Physical Exam:  BP (!) 144/38 (BP Location: Left Arm)   Pulse 64   Temp 97.7 F (36.5 C) (Oral)   Resp 16   Ht 5\' 3"  (1.6 m)   Wt 75.4 kg (166 lb 3.2 oz)   SpO2 100%   BMI 29.44 kg/m  Constitutional: She appears well-developed and well-nourished. NAD. HENT: Normocephalic and atraumatic.  Eyes: EOMI. No discharge.  Cardiovascular: RRR.  No JVD. Respiratory: Effort normal and breath sounds normal.  GI: Soft. Bowel sounds are normal.  Musculoskeletal: She exhibits no edema or tenderness.  Neurological: She is alert and oriented.  Right facial weakness with predominantly expressive receptive aphasia, improving Motor: RUE: 4-/5 proximal to distal (improving) RLE: 4-/5 proximal to distal (improving) LUE/LLE: 5/5  Skin: Skin is warm and dry.  Psychiatric: She has a normal mood and affect. Her behavior is normal.   Assessment/Plan: 1. Functional deficits secondary to left MCA infarct which require 3+ hours per day of interdisciplinary therapy in a comprehensive inpatient rehab setting. Physiatrist is  providing close team supervision and 24 hour management of active medical problems listed below. Physiatrist and rehab team continue to assess barriers to discharge/monitor patient progress toward functional and medical goals.  Function:  Bathing Bathing position Bathing activity did not occur: N/A (stated she'd bathed already) Position: Wheelchair/chair at sink  Bathing parts Body parts bathed by patient: Chest, Abdomen, Right upper leg, Left upper leg, Right arm, Front perineal area, Right lower leg Body parts bathed by helper: Left arm, Buttocks, Left lower leg, Back  Bathing assist Assist Level:  (Mod assist)      Upper Body Dressing/Undressing Upper body dressing Upper body dressing/undressing activity did not occur: N/A (stated she'd already dressed (only donned socks and shoes)) What is the patient wearing?: Bra, Pull over shirt/dress Bra - Perfomed by patient: Thread/unthread right bra strap Bra - Perfomed by helper: Thread/unthread left bra strap, Hook/unhook bra (pull down sports bra) Pull over shirt/dress - Perfomed by patient: Put head through opening, Pull shirt over trunk Pull over shirt/dress - Perfomed by helper: Thread/unthread right sleeve, Thread/unthread left sleeve        Upper body assist Assist Level:  (Max assist)      Lower Body Dressing/Undressing Lower body dressing   What is the patient wearing?: Pants, Socks, Shoes     Pants- Performed by patient: Thread/unthread right pants leg Pants- Performed by helper: Thread/unthread left pants leg, Pull pants up/down  Non-skid slipper socks- Performed by helper: Don/doff right sock, Don/doff left sock   Socks - Performed by helper: Don/doff right sock, Don/doff left sock Shoes - Performed by patient: Fasten right, Fasten left (velcro strap shoes) Shoes - Performed by helper: Don/doff right shoe, Don/doff left shoe          Lower body assist Assist for lower body dressing:  (Max assist)       Toileting Toileting Toileting activity did not occur: N/A Toileting steps completed by patient: Performs perineal hygiene Toileting steps completed by helper: Adjust clothing prior to toileting, Performs perineal hygiene, Adjust clothing after toileting Toileting Assistive Devices: Grab bar or rail  Toileting assist Assist level: Supervision or verbal cues, Touching or steadying assistance (Pt.75%)   Transfers Chair/bed transfer   Chair/bed transfer method: Stand pivot Chair/bed transfer assist level: Moderate assist (Pt 50 - 74%/lift or lower) Chair/bed transfer assistive device: Mechanical lift Mechanical lift: Stedy   Locomotion Ambulation     Max distance:  (20 ft) Assist level: Moderate assist (Pt 50 - 74%)   Wheelchair     Max wheelchair distance:  (100 ft) Assist Level: Moderate assistance (Pt 50 - 74%)  Cognition Comprehension Comprehension assist level: Understands basic 75 - 89% of the time/ requires cueing 10 - 24% of the time  Expression Expression assist level: Expresses basic 50 - 74% of the time/requires cueing 25 - 49% of the time. Needs to repeat parts of sentences.  Social Interaction Social Interaction assist level: Interacts appropriately 75 - 89% of the time - Needs redirection for appropriate language or to initiate interaction.  Problem Solving Problem solving assist level: Solves basic 25 - 49% of the time - needs direction more than half the time to initiate, plan or complete simple activities  Memory Memory assist level: Recognizes or recalls 50 - 74% of the time/requires cueing 25 - 49% of the time    Medical Problem List and Plan: 1.  Right hemiparesis, global aphasia secondary to left MCA infarct.  Cont CIR 2.  DVT Prophylaxis/Anticoagulation: Pharmaceutical: Other (comment)--Eliquis. Monitor for any new neurological changes.  3. Pain Management: tylenol prn for now 4. Mood: No signs of distress--continue prozac. LCSW to follow for evaluation and  support.  5. Neuropsych: This patient is not fully capable of making decisions on her own behalf. 6. Skin/Wound Care: routine pressure measures 7. Fluids/Electrolytes/Nutrition: Monitor I/Os.   Now D2 thins.  8. AFib: Monitor HR bid--continue metoprolol and Eliquis 9. CKD ?III: ?Baseline SCR 1.3-1.4. Improved with hydration  Cr 1.15 on 6/13  Cont to monitor.    IVF ordered x3 nights (1 more) 10. Encephalopathy: Resolved. Due to narcotic accumulation. 11. HTN: monitor BP bid.   Continue Lasix and metoprolol.   Avapro increased to 225 on 6/8, increased to 300 on 6/10  Overall controlled 6/14 12. Prediabetes: Educate on diet as cognition improves  Overall controlled 6/13 13. Diastolic Heart failure: On lasix, metoprolol, avapro and Zocor  Filed Weights   12/29/16 0500 12/30/16 0450 12/31/16 0500  Weight: 77.6 kg (171 lb) 76.2 kg (168 lb 1.6 oz) 75.4 kg (166 lb 3.2 oz)  14. H/o anemia: Resolved  Monitor H/H as now on Eliquis. Monitor for any signs of bleeding.   Hb 12.2 on 6/11  Cont to monitor 15. Hypoalbuminemia  Supplement initiated 6/7 16. Hypokalemia  K+ 3.8 on 6/13  Supplementing daily  LOS (Days) 8 A FACE TO FACE EVALUATION WAS PERFORMED  Drayk Humbarger Lorie Phenix 12/31/2016 8:31 AM

## 2016-12-31 NOTE — Progress Notes (Signed)
Occupational Therapy Weekly Progress Note  Patient Details  Name: Valerie Mcclure MRN: [id number] Date of Birth: [birth year]  Beginning of progress report period: December 24, 2016 End of progress report period: December 31, 2016  Today's Date: 12/31/2016 OT Individual Time: 1287-8676 and 1415-1455 OT Individual Time Calculation (min): 58 min and 40 min   Patient has met 4 of 4 short term goals.  Pt is making steady progress towards goals.  Pt continues to demonstrate posterior lean during transitional movements and during dynamic standing, however much improved from eval.  Pt is demonstrating improved Rt attention and AROM even incorporating RUE into bathing.    Patient continues to demonstrate the following deficits: muscle weakness, decreased cardiorespiratoy endurance, impaired timing and sequencing, unbalanced muscle activation and decreased coordination, decreased attention to right, decreased attention, decreased awareness, decreased problem solving, decreased memory and delayed processingand decreased sitting balance, decreased standing balance, decreased postural control, hemiplegia and decreased balance strategies and therefore will continue to benefit from skilled OT intervention to enhance overall performance with BADL and Reduce care partner burden.  Patient progressing toward long term goals..  Continue plan of care.  OT Short Term Goals Week 1:  OT Short Term Goal 1 (Week 1): Pt will complete bathing with mod assist at sit > stand level  OT Short Term Goal 1 - Progress (Week 1): Met OT Short Term Goal 2 (Week 1): pt will don shirt with min assist OT Short Term Goal 2 - Progress (Week 1): Met OT Short Term Goal 3 (Week 1): pt will don pants with mod assist at sit > stand level OT Short Term Goal 3 - Progress (Week 1): Met OT Short Term Goal 4 (Week 1): Pt will complete toilet transfers with mod assist OT Short Term Goal 4 - Progress (Week 1): Met OT Short Term Goal 5 (Week 1): Pt  will locate items on Rt to complete self-care tasks with min cues OT Short Term Goal 5 - Progress (Week 1): Met Week 2:  OT Short Term Goal 1 (Week 2): STG = LTGs due to remaining LOS  Skilled Therapeutic Interventions/Progress Updates:    1) Treatment session with focus on functional transfers, dynamic standing balance, and functional use of RUE during bathing and dressing tasks.  Pt completed stand step transfer in/out of room shower with min assist with improved initiation of movement.  Pt able to wash Lt arm and underarm this session with use of wash mitt to compensate for decreased grasp on wash cloth.  Washed lower legs by crossing leg over opposite knee.  Min assist for standing balance with therapist washing buttocks while pt focused on standing balance.  Engaged in hemi-dressing with focus on orientation of clothing and ensuring success when donning RUE and RLE first.  Min cues for utilization of compensatory strategies when applying toothpaste to toothbrush.    2) Treatment session with focus on Rt attention and standing balance.  Pt received from handoff after PT session.  Pt asking about lunch as she never received her lunch tray.  Pt engaged in self-feeding with use of LUE, scanning to Rt side of tray to eat from each item on plate and obtain tea in Rt visual field.  Engaged in [place] in sitting and standing to increase attention and scanning to Rt.  Pt utilized LUE only during Dynavision activity with good trunk rotation in standing with min assist due to increased weakness with fatigue.  Pt selected 4 and 52 in 60 second time  limit in standing and 58/58 when provided with 2 second time limit for 60 seconds.  Pt with marginally slower reaction times in Rt vs Lt hemi, also to be attributed to use of only LUE.  Therapy Documentation Precautions:  Precautions Precautions: Fall Precaution Comments: Rt hemiparesis, Rt inattention Restrictions Weight Bearing Restrictions: No Pain:  Pt  with no c/o pain  See Function Navigator for Current Functional Status.   Therapy/Group: Individual Therapy  Simonne Come 12/31/2016, 9:34 AM

## 2016-12-31 NOTE — Progress Notes (Signed)
Speech Language Pathology Weekly Progress and Session Note  Patient Details  Name: Valerie Mcclure MRN: [id number] Date of Birth: 1940-10-27  Beginning of progress report period: December 24, 2016    End of progress report period:  December 31, 2016   Today's Date: 12/31/2016 SLP Individual Time: 7989-2119 SLP Individual Time Calculation (min): 57 min  Short Term Goals: Week 1: SLP Short Term Goal 1 (Week 1): Pt will consume dys 2 textures and thin liquids with supervision verbal cues for use of swallowing precautions.   SLP Short Term Goal 1 - Progress (Week 1): Progressing toward goal SLP Short Term Goal 2 (Week 1): Pt will recognize and correct verbal errors with mod assist multimodal cues.  SLP Short Term Goal 2 - Progress (Week 1): Met SLP Short Term Goal 3 (Week 1): Pt will produce accurate phrase level expression during functional tasks for >75% of opportunities with mod assist verbal cues.   SLP Short Term Goal 3 - Progress (Week 1): Progressing toward goal SLP Short Term Goal 4 (Week 1): Pt will follow multi-unit commands to complete functional tasks with mod assist verbal cues.   SLP Short Term Goal 4 - Progress (Week 1): Met SLP Short Term Goal 5 (Week 1): Pt will consume therapeutic trials of dys 3 textures with supervision verbal cues to clear residue from the oral cavity.   SLP Short Term Goal 5 - Progress (Week 1): Progressing toward goal    New Short Term Goals: Week 2: SLP Short Term Goal 1 (Week 2): Pt will consume dys 2 textures and thin liquids with supervision verbal cues for use of swallowing precautions.   SLP Short Term Goal 2 (Week 2): Pt will recognize and correct verbal errors with mod assist verbal cues.  SLP Short Term Goal 3 (Week 2): Pt will produce accurate phrase level expression during functional tasks for >75% of opportunities with mod assist verbal cues.   SLP Short Term Goal 4 (Week 2): Pt will follow multi-unit commands to complete functional tasks with  min assist verbal cues.   SLP Short Term Goal 5 (Week 2): Pt will consume therapeutic trials of dys 3 textures with supervision verbal cues to clear residue from the oral cavity.    Weekly Progress Updates:  Pt has made slow but functional gains this reporting period despite having only met 2 out of 5 short term goals.  Pt is currently mod-max assist for verbal expression at the phrase level during structured tasks but has demonstrated improved automaticity with her conversational language.  Pt is also demonstrating improved mastication of dys 3 textures and may be ready for an upgrade of solid soon depending use of swallowing precautions with a full meal versus a snack.  Pts liquids have been upgraded to thin per MBS on 6/8 and pt has demonstrated excellent toleration as evidenced by no spikes in temp, WFL O2 sats on room air, and clear lung sounds per chart review.  Pt does need min-mod assist for use of chin tuck given motor planning impairment.  As a result, pt would benefit from continued ST follow up while inpatient in order to maximize functional independence and reduce burden of care prior to discharge.  Continue to anticipate that pt will need 24/7 supervision at discharge in addition to [facility] follow up at next level of care.       Intensity: Minumum of 1-2 x/day, 30 to 90 minutes Frequency: 3 to 5 out of 7 days Duration/Length of Stay: 10-14  days  Treatment/Interventions: Cognitive remediation/compensation;Cueing hierarchy;Dysphagia/aspiration precaution training;Environmental controls;Functional tasks;Internal/external aids;Patient/family education;Speech/Language facilitation   Daily Session  Skilled Therapeutic Interventions: Pt was seen for skilled ST targeting goals for communication and dysphagia.  SLP facilitated the session with a functional snack of dys 3 textures and thin liquids.  Pt demonstrated improved mastication of solids with effective clearance of residuals from the oral  cavity post swallow.  Pt is not consistent in her timing of chin tuck posture despite frequent repetition with trials; however, pt is demonstrating s/s of improving airway protection with head in a more neutral posture and had only x1 instance of immediate coughing when consuming 4 oz of thin liquids.  Will continue to follow up to work towards d/c of chin tuck.  For now, recommend a trial meal tray of dys 3 textures at next available appointment to continue working towards diet progression.  Pt required mod assist sentence completion cues for verbal expression at the word and short phrase level during simple picture description tasks.  Spontaneous verbal expression during conversations with therapist is noted to be improving.  Pt returned to room at the end of today's therapy session with quick release belt donned and call bell within reach.  Goals updated to reflect current progress and plan of care.       Function:   Eating Eating   Modified Consistency Diet: Yes Eating Assist Level: Set up assist for;Supervision or verbal cues   Eating Set Up Assist For: Opening containers       Cognition Comprehension Comprehension assist level: Understands basic 75 - 89% of the time/ requires cueing 10 - 24% of the time  Expression   Expression assist level: Expresses basic 50 - 74% of the time/requires cueing 25 - 49% of the time. Needs to repeat parts of sentences.  Social Interaction Social Interaction assist level: Interacts appropriately 90% of the time - Needs monitoring or encouragement for participation or interaction.  Problem Solving Problem solving assist level: Solves basic 25 - 49% of the time - needs direction more than half the time to initiate, plan or complete simple activities  Memory Memory assist level: Recognizes or recalls 50 - 74% of the time/requires cueing 25 - 49% of the time   General    Pain Pain Assessment Pain Assessment: No/denies pain  Therapy/Group: Individual  Therapy  Carvin Almas, Selinda Orion 12/31/2016, 12:11 PM

## 2016-12-31 NOTE — Progress Notes (Signed)
Social Work Patient ID: Valerie Mcclure, female   DOB: [birth year], 76 y.o.   MRN: [id number]   Have reviewed team conference information with pt and spouse who are aware and agreeable with targeted d/c date of 6/21 and minimal assistance overall.  Both pleased with progress so far and deny any concerns.  Continue to follow.  Audine Mangione, LCSW

## 2016-12-31 NOTE — Progress Notes (Signed)
Physical Therapy Weekly Progress Note  Patient Details  Name: SUZETTE FLAGLER MRN: [id number] Date of Birth: 21-Jan-1941  Beginning of progress report period: December 24, 2016 End of progress report period: December 31, 2016  Patient has met 2 of 2 short term goals.  Pt improving transfers and gait, still limited by significant Rt LE weakness and motor planning and coordination deficits.  Patient continues to demonstrate the following deficits muscle weakness, impaired timing and sequencing, abnormal tone, unbalanced muscle activation, motor apraxia, decreased coordination and decreased motor planning, decreased awareness, decreased problem solving, decreased safety awareness, decreased memory and delayed processing and decreased sitting balance, decreased standing balance, decreased postural control, hemiplegia and decreased balance strategies and therefore will continue to benefit from skilled PT intervention to increase functional independence with mobility.  Patient progressing toward long term goals..  Continue plan of care.  PT Short Term Goals Week 2:  PT Short Term Goal 1 (Week 2): Pt will consistently perform functional transfers at mod A level PT Short Term Goal 1 - Progress (Week 2): Met PT Short Term Goal 2 (Week 2): Pt will perform gait 25' with mod A in controlled environment PT Short Term Goal 2 - Progress (Week 2): Met Week 3:  PT Short Term Goal 1 (Week 3): STG=LTG  Skilled Therapeutic Interventions/Progress Updates:  Ambulation/gait training;Cognitive remediation/compensation;DME/adaptive equipment instruction;Discharge planning;Functional mobility training;Pain management;Splinting/orthotics;Therapeutic Activities;UE/LE Strength taining/ROM;Wheelchair propulsion/positioning;Therapeutic Exercise;UE/LE Coordination activities;Stair training;Patient/family education;Neuromuscular re-education;Functional electrical stimulation;Community reintegration;Balance/vestibular training      DONAWERTH,KAREN 12/31/2016, 3:01 PM

## 2016-12-31 NOTE — Patient Care Conference (Signed)
Inpatient RehabilitationTeam Conference and Plan of Care Update Date: 12/30/2016   Time: 11:40 AM    Patient Name: Valerie Mcclure      Medical Record Number: [id number]  Date of Birth: Aug 12, 1940 Sex: Female         Room/Bed: 4W05C/4W05C-01 Payor Info: Payor: [employer] MEDICARE / Plan: [employer] MEDICARE / Product Type: *No Product type* /    Admitting Diagnosis: CVA  Admit Date/Time:  12/23/2016  4:39 PM Admission Comments: No comment available   Primary Diagnosis:  <principal problem not specified> Principal Problem: <principal problem not specified>  Patient Active Problem List   Diagnosis Date Noted  . Dysarthria, post-stroke   . Hypoalbuminemia due to protein-calorie malnutrition ([place])   . Global aphasia   . Encephalopathy 12/23/2016  . Acute on chronic diastolic heart failure ([place])   . Acute blood loss anemia   . Tachypnea   . Toxic encephalopathy 12/22/2016  . Atrial flutter ([place]) 12/22/2016  . Hypokalemia 12/21/2016  . Obesity (BMI 30-39.9) 12/21/2016  . Narcotic overdose 12/19/2016  . Spastic hemiplegia of right dominant side as late effect of cerebral infarction ([place])   . E-coli UTI   . Anemia of chronic disease   . Benign essential HTN   . Dysphagia, post-stroke   . Non-fluent aphasia   . Anterior cerebral circulation hemorrhagic infarction ([place]) 12/03/2016  . PAC (premature atrial contraction) 12/03/2016  . Acute ischemic left MCA stroke ([place]) 12/03/2016  . Aphasia as late effect of stroke 12/03/2016  . Right hemiparesis ([place])   . Nontraumatic subcortical hemorrhage of left cerebral hemisphere ([place])   . Acute embolic stroke ([place]) 48/54/6270  . Mild aortic regurgitation 08/20/2016  . Bilateral edema of lower extremity 04/07/2016  . Pancreatic duct dilated 01/09/2016  . CKD (chronic kidney disease) 12/25/2015  . Mild tricuspid regurgitation 12/25/2015  . Mild mitral regurgitation 12/25/2015  . Prediabetes 06/10/2015  . Edema 06/04/2015  . Abdominal  pain, chronic, right upper quadrant 06/04/2015  . Postmenopausal disorder 04/03/2014  . Chronic low back pain   . PAIN IN THORACIC SPINE 04/18/2010  . Coronary atherosclerosis 01/24/2009  . DEGENERATIVE JOINT DISEASE 01/23/2009  . ARTHRITIS, LEFT KNEE 12/13/2008  . HERNIATED LUMBAR DISC 12/13/2008  . Hypothyroidism 11/21/2007  . Hyperlipidemia 11/09/2007  . ANEMIA 11/09/2007  . Essential hypertension 11/09/2007  . Osteopenia 11/09/2007    Expected Discharge Date: Expected Discharge Date: 01/07/17  Team Members Present: Physician leading conference: Dr. Delice Lesch Social Worker Present: Lennart Pall, LCSW Nurse Present: Other (comment) Genene Churn, RN) PT Present: Jorge Mandril, PT OT Present: Simonne Come, OT SLP Present: Windell Moulding, SLP PPS Coordinator present : Daiva Nakayama, RN, CRRN     Current Status/Progress Goal Weekly Team Focus  Medical   Right hemiparesis, global aphasia secondary to left MCA infarct  Improve mobility, stregnth, dysphagia, hypokalemia  See above   Bowel/Bladder   incontinent  of bladder at times,BM 6/14  toileting more often  get to contienence level all the time   Swallow/Nutrition/ Hydration   Dys 2, thin liquids per MBS; min-mod assist for use of swallowing precautions   Supervision   education and carryover of use of chin tuck    ADL's   mod assist bathing, max assist UB and LB dressing, mod assist stand pivot transfers, poor trunk control and RLE extensor tone  Min assist  ADL retraining, transfers, RUE NMR, Rt attention, pt/family education   Mobility   min A transfers, mod A gait without AD  min A transfers and gait  family ed, d/c planning, strength, balance   Communication   Mod-max assist   mod assist   object naming, functional communication to convey needs and wants to caregivers, awareness of verbal errors, consistency with verbal production    Safety/Cognition/ Behavioral Observations  Mod-max A  Mod A   safety awareness, problem  solving, error awareness    Pain   no complains of pain  less<2  monitor ane adress it as need it   Skin   intact  no breakdown  monitor and assess it every shift    Rehab Goals Patient on target to meet rehab goals: Yes *See Care Plan and progress notes for long and short-term goals.  Barriers to Discharge: Mobiity, dysphagia, CKD, HTN, prediabtes, aphasia    Possible Resolutions to Barriers:  Therapies, IVF for 2 night, follow BP,     Discharge Planning/Teaching Needs:  Plan for pt to d/c home with spouse providing primary care.  Teaching to be done closer to d/c and prn.   Team Discussion:  BP stabilizing;  Some stool incont.  MBS done with upgrade to thin.  Mod- max communication but improving.  Min - mod assist PT/OT with transfers.  AFO not fitting properly and await a fix.  Requires lots of VCs for b/d.  Much imprived right UE movement.  Goals at min assist overall and plan to begin fam ed next week.  Revisions to Treatment Plan:  None   Continued Need for Acute Rehabilitation Level of Care: The patient requires daily medical management by a physician with specialized training in physical medicine and rehabilitation for the following conditions: Daily direction of a multidisciplinary physical rehabilitation program to ensure safe treatment while eliciting the highest outcome that is of practical value to the patient.: Yes Daily medical management of patient stability for increased activity during participation in an intensive rehabilitation regime.: Yes Daily analysis of laboratory values and/or radiology reports with any subsequent need for medication adjustment of medical intervention for : Neurological problems;Blood pressure problems;Renal problems;Urological problems;Other  Mimi Debellis 12/31/2016, 3:18 PM

## 2017-01-01 ENCOUNTER — Inpatient Hospital Stay (HOSPITAL_COMMUNITY): Payer: Medicare Other | Admitting: Occupational Therapy

## 2017-01-01 ENCOUNTER — Inpatient Hospital Stay (HOSPITAL_COMMUNITY): Payer: Medicare Other | Admitting: Speech Pathology

## 2017-01-01 ENCOUNTER — Inpatient Hospital Stay (HOSPITAL_COMMUNITY): Payer: Medicare Other | Admitting: Physical Therapy

## 2017-01-01 NOTE — Progress Notes (Signed)
Occupational Therapy Session Note  Patient Details  Name: Valerie Mcclure MRN: [id number] Date of Birth: 07/29/1940  Today's Date: 01/01/2017 OT Individual Time: [id number] and 1130-1200 OT Individual Time Calculation (min): 57 min and 30 min   Short Term Goals: Week 2:  OT Short Term Goal 1 (Week 2): STG = LTGs due to remaining LOS  Skilled Therapeutic Interventions/Progress Updates:    1) Treatment session with focus on ADL retraining with transfers, sit > stand, and increased functional use of RUE during bathing and dressing tasks.  Pt received in bed, completed bed mobility with supervision/mod cues for sequencing and weight shifting.  Stand pivot transfer to w/c with mod assist due to posterior lean.  Grooming completed in sitting with setup to obtain items, pt able to utilize RUE as gross assist to hold toothpaste while opening.  UB bathing and dressing completed with supervision, min cues for increased attention to RUE during use when washing Lt arm and threading shirt.  Therapist assisted with setup to gather shirt sleeve, then pt able to complete dressing with only min cues.  LB bathing and dressing with min cues to cross leg over opposite knee with pt able to thread pants.  Increased trunk control and rotation with pulling pants over hips, pt requiring cues reach around body with Lt hand as RUE with decreased sensation and proprioception when pulling up pants.  Backward chaining with donning socks, pt requiring assist to thread but the able to pull over foot and heel.  Required assist to fasten shoes, may benefit from elastic shoe laces or shoe button - TBD with fit of AFO.  2) Treatment session with focus on functional reaching and grasp with RUE.  Utilized dominoes to address reaching and manipulation of RUE with purposeful reaching and flipping over dominoes.  Pt demonstrating ability to reach towards dominoes but decreased motor planning to actually reach for dominoes to flip over.   Once placed between pt thumb and fingers pt able to coordinate movement to flip over.  Addressed horizontal abduction to place dominoes in sequence.   Therapy Documentation Precautions:  Precautions Precautions: Fall Precaution Comments: Rt hemiparesis, Rt inattention Restrictions Weight Bearing Restrictions: No General:   Vital Signs: Therapy Vitals BP: (!) 142/64 Patient Position (if appropriate): Lying Pain: Pain Assessment Pain Assessment: No/denies pain ADL:   Vision   Perception    Praxis   Exercises:   Other Treatments:    See Function Navigator for Current Functional Status.   Therapy/Group: Individual Therapy  Jozette Castrellon, [place] 01/01/2017, 9:00 AM

## 2017-01-01 NOTE — Progress Notes (Signed)
Physical Therapy Note  Patient Details  Name: [name] MRN: [id number] Date of Birth: 21-Aug-1940 Today's Date: 01/01/2017    Time: [phone] 55 minutes  1:1 No c/o pain.  Tall kneeling for Rt hip strengthening with min A to get into position, reaching tasks with manual facilitation for Rt hip protraction and tactile cues for Rt hip and glute activation.  Pt min A for balance in tall kneeling.  Tall kneeling with wt on elbows with alternating hip extension with mod A for Rt glute activation.  Gait 50' x 2 with min A, manual cues for hip and trunk activation on Rt.  Gait through cones with max/total A for motor planning and coordination, min/mod A for balance.  Rt UE NMR with minA for holding cards, min cues for attention to Rt hand.  Pt able to match cards with 100% accuracy.   Jacelyn Cuen 01/01/2017, 10:29 AM

## 2017-01-01 NOTE — Progress Notes (Signed)
Speech Language Pathology Daily Session Note  Patient Details  Name: Valerie Mcclure MRN: [id number] Date of Birth: 10-08-1940  Today's Date: 01/01/2017 SLP Individual Time: 1300-1400 SLP Individual Time Calculation (min): 60 min  Short Term Goals: Week 2: SLP Short Term Goal 1 (Week 2): Pt will consume dys 2 textures and thin liquids with supervision verbal cues for use of swallowing precautions.   SLP Short Term Goal 2 (Week 2): Pt will recognize and correct verbal errors with mod assist verbal cues.  SLP Short Term Goal 3 (Week 2): Pt will produce accurate phrase level expression during functional tasks for >75% of opportunities with mod assist verbal cues.   SLP Short Term Goal 4 (Week 2): Pt will follow multi-unit commands to complete functional tasks with min assist verbal cues.   SLP Short Term Goal 5 (Week 2): Pt will consume therapeutic trials of dys 3 textures with supervision verbal cues to clear residue from the oral cavity.    Skilled Therapeutic Interventions:  Pt was seen for skilled ST targeting goals for dysphagia and communication.  SLP facilitated the session with a trial meal tray of dys 3 textures to continue working towards diet progression.  Pt utilized swallowing precautions with supervision verbal cues with minimal overt s/s of aspiration.  Pt was able to clear advanced solids from the oral cavity with extra time.  Recommend advancing pt's diet to dys 3 textures with thin liquids and full supervision for use of swallowing precautions.  Therapist facilitated the session with a menu planning task to address verbal expression.  Pt was able to convey meal choices to therapist at the phrase level when given a choice of 2-3 items.  Pt is demonstrating improving ability to monitor and correct verbal errors during conversational speech with min-mod assist verbal cues.  Pt was left in wheelchair with quick release belt donned and call bell within reach.  Continue per current plan  of care.       Function:  Eating Eating   Modified Consistency Diet: Yes Eating Assist Level: Set up assist for;Supervision or verbal cues   Eating Set Up Assist For: Opening containers       Cognition Comprehension Comprehension assist level: Understands basic 90% of the time/cues < 10% of the time  Expression   Expression assist level: Expresses basic 50 - 74% of the time/requires cueing 25 - 49% of the time. Needs to repeat parts of sentences.  Social Interaction Social Interaction assist level: Interacts appropriately 75 - 89% of the time - Needs redirection for appropriate language or to initiate interaction.  Problem Solving Problem solving assist level: Solves basic 50 - 74% of the time/requires cueing 25 - 49% of the time  Memory Memory assist level: Recognizes or recalls 50 - 74% of the time/requires cueing 25 - 49% of the time    Pain Pain Assessment Pain Assessment: No/denies pain  Therapy/Group: Individual Therapy  Rand Boller, Selinda [place] 01/01/2017, 2:03 PM

## 2017-01-01 NOTE — Progress Notes (Signed)
[facility] PHYSICAL MEDICINE & REHABILITATION     PROGRESS NOTE  Subjective/Complaints:  Pt up in bed. Feels well. Denies pain  ROS: pt denies nausea, vomiting, diarrhea, cough, shortness of breath or chest pain  Objective: Vital Signs: Blood pressure (!) 142/64, pulse 61, temperature 97.7 F (36.5 C), temperature source Oral, resp. rate 16, height 5\' 3"  (1.6 m), weight 75.5 kg (166 lb 6.4 oz), SpO2 97 %. No results found. No results for input(s): WBC, HGB, HCT, PLT in the last 72 hours.  Recent Labs  12/30/16 0407  NA 138  K 3.8  CL 105  GLUCOSE 98  BUN 32*  CREATININE 1.15*  CALCIUM 8.9   CBG (last 3)  No results for input(s): GLUCAP in the last 72 hours.  Wt Readings from Last 3 Encounters:  01/01/17 75.5 kg (166 lb 6.4 oz)  12/23/16 76.8 kg (169 lb 6.4 oz)  12/16/16 72.6 kg (160 lb)    Physical Exam:  BP (!) 142/64 (BP Location: Left Arm)   Pulse 61   Temp 97.7 F (36.5 C) (Oral)   Resp 16   Ht 5\' 3"  (1.6 m)   Wt 75.5 kg (166 lb 6.4 oz)   SpO2 97%   BMI 29.48 kg/m  Constitutional: She appears well-developed and well-nourished. NAD. HENT: Normocephalic and atraumatic.  Eyes: EOMI. No discharge.  Cardiovascular: RRR without murmur. No JVD . Respiratory: CTA Bilaterally without wheezes or rales. Normal effort  GI: Soft. Bowel sounds are normal.  Musculoskeletal: She exhibits no edema or tenderness.  Neurological: She is alert and oriented.  Right facial weakness with predominantly expressive receptive aphasia, improving Motor: RUE: 4-/5 proximal to distal (improving) RLE: 4-/5 proximal to distal (improving) LUE/LLE: 5/5  Skin: Skin is warm and dry.  Psychiatric: She has a normal mood and affect. Her behavior is normal.   Assessment/Plan: 1. Functional deficits secondary to left MCA infarct which require 3+ hours per day of interdisciplinary therapy in a comprehensive inpatient rehab setting. Physiatrist is providing close team supervision and 24 hour  management of active medical problems listed below. Physiatrist and rehab team continue to assess barriers to discharge/monitor patient progress toward functional and medical goals.  Function:  Bathing Bathing position Bathing activity did not occur: N/A (stated she'd bathed already) Position: Shower  Bathing parts Body parts bathed by patient: Chest, Abdomen, Right upper leg, Left upper leg, Right arm, Front perineal area, Right lower leg, Left arm, Left lower leg Body parts bathed by helper: Buttocks, Back  Bathing assist Assist Level: Touching or steadying assistance(Pt > 75%)      Upper Body Dressing/Undressing Upper body dressing Upper body dressing/undressing activity did not occur: N/A (stated she'd already dressed (only donned socks and shoes)) What is the patient wearing?: Pull over shirt/dress Bra - Perfomed by patient: Thread/unthread right bra strap Bra - Perfomed by helper: Thread/unthread left bra strap, Hook/unhook bra (pull down sports bra) Pull over shirt/dress - Perfomed by patient: Thread/unthread right sleeve, Thread/unthread left sleeve, Put head through opening, Pull shirt over trunk Pull over shirt/dress - Perfomed by helper: Thread/unthread right sleeve        Upper body assist Assist Level: Set up, Supervision or verbal cues   Set up : To obtain clothing/put away  Lower Body Dressing/Undressing Lower body dressing   What is the patient wearing?: Pants, Socks, Shoes     Pants- Performed by patient: Thread/unthread right pants leg, Thread/unthread left pants leg Pants- Performed by helper: Pull pants up/down  Non-skid slipper socks- Performed by helper: Don/doff right sock, Don/doff left sock   Socks - Performed by helper: Don/doff right sock, Don/doff left sock Shoes - Performed by patient: Fasten right, Fasten left (velcro strap shoes) Shoes - Performed by helper: Don/doff right shoe, Don/doff left shoe, Fasten right          Lower body assist  Assist for lower body dressing:  (Max assist)      Toileting Toileting Toileting activity did not occur: N/A Toileting steps completed by patient: Adjust clothing prior to toileting, Performs perineal hygiene Toileting steps completed by helper: Adjust clothing prior to toileting Toileting Assistive Devices: Grab bar or rail  Toileting assist Assist level: Supervision or verbal cues, Touching or steadying assistance (Pt.75%)   Transfers Chair/bed transfer   Chair/bed transfer method: Stand pivot Chair/bed transfer assist level: Moderate assist (Pt 50 - 74%/lift or lower) Chair/bed transfer assistive device: Mechanical lift Mechanical lift: Stedy   Locomotion Ambulation     Max distance:  (20 ft) Assist level: Moderate assist (Pt 50 - 74%)   Wheelchair     Max wheelchair distance:  (100 ft) Assist Level: Moderate assistance (Pt 50 - 74%)  Cognition Comprehension Comprehension assist level: Understands basic 75 - 89% of the time/ requires cueing 10 - 24% of the time  Expression Expression assist level: Expresses basic 75 - 89% of the time/requires cueing 10 - 24% of the time. Needs helper to occlude trach/needs to repeat words.  Social Interaction Social Interaction assist level: Interacts appropriately 75 - 89% of the time - Needs redirection for appropriate language or to initiate interaction.  Problem Solving Problem solving assist level: Solves basic 75 - 89% of the time/requires cueing 10 - 24% of the time  Memory Memory assist level: Recognizes or recalls 50 - 74% of the time/requires cueing 25 - 49% of the time    Medical Problem List and Plan: 1.  Right hemiparesis, global aphasia secondary to left MCA infarct.  Cont CIR 2.  DVT Prophylaxis/Anticoagulation: Eliquis. Monitor for any new neurological changes.  3. Pain Management: tylenol prn for now 4. Mood: No signs of distress--continue prozac. LCSW to follow for evaluation and support.  5. Neuropsych: This patient is  not fully capable of making decisions on her own behalf. 6. Skin/Wound Care: routine pressure measures 7. Fluids/Electrolytes/Nutrition: Monitor I/Os.   Now D2 thins.   -intake still up and down 8. AFib: Monitor HR bid--continue metoprolol and Eliquis 9. CKD ?III: ?Baseline SCR 1.3-1.4. Improved with hydration  Cr 1.15 on 6/13  Cont to monitor. Reviewed PO hydration with patient today   S/p 3 nights of IVF hydration---plan was to stop today, but she's really not taking enough in by mouth yet---continue for now  -recheck labs tomorrow 10. Encephalopathy: Resolved. Due to narcotic accumulation. 11. HTN: monitor BP bid.   Continue Lasix and metoprolol.   Avapro increased to 225 on 6/8, increased to 300 on 6/10  Overall controlled 6/15 12. Prediabetes: Educate on diet as cognition improves  Overall controlled 6/15 13. Diastolic Heart failure: On lasix, metoprolol, avapro and Zocor  Filed Weights   12/30/16 0450 12/31/16 0500 01/01/17 0500  Weight: 76.2 kg (168 lb 1.6 oz) 75.4 kg (166 lb 3.2 oz) 75.5 kg (166 lb 6.4 oz)  14. H/o anemia: Resolved  Monitor H/H as now on Eliquis. Monitor for any signs of bleeding.   Hb 12.2 on 6/11  Cont to monitor 15. Hypoalbuminemia  Supplement initiated 6/7 16. Hypokalemia  K+ 3.8 on 6/13  Supplementing daily  LOS (Days) 9 A FACE TO FACE EVALUATION WAS PERFORMED  SWARTZ,ZACHARY T 01/01/2017 9:18 AM

## 2017-01-02 ENCOUNTER — Inpatient Hospital Stay (HOSPITAL_COMMUNITY): Payer: Medicare Other

## 2017-01-02 DIAGNOSIS — I4891 Unspecified atrial fibrillation: Secondary | ICD-10-CM

## 2017-01-02 LAB — BASIC METABOLIC PANEL WITH GFR
Anion gap: 8 (ref 5–15)
BUN: 19 mg/dL (ref 6–20)
CO2: 24 mmol/L (ref 22–32)
Calcium: 8.7 mg/dL — ABNORMAL LOW (ref 8.9–10.3)
Chloride: 108 mmol/L (ref 101–111)
Creatinine, Ser: 1.19 mg/dL — ABNORMAL HIGH (ref 0.44–1.00)
GFR calc Af Amer: 50 mL/min — ABNORMAL LOW (ref >60)
GFR calc non Af Amer: 43 mL/min — ABNORMAL LOW (ref >60)
Glucose, Bld: 119 mg/dL — ABNORMAL HIGH (ref 65–99)
Potassium: 3.8 mmol/L (ref 3.5–5.1)
Sodium: 140 mmol/L (ref 135–145)

## 2017-01-02 NOTE — Progress Notes (Signed)
Valerie Mcclure is a 76 y.o. female Mar 12, 1941 [id number]  Subjective: No new complaints. No new problems. Slept well.  Objective: Vital signs in last 24 hours: Temp:  [97.7 F (36.5 C)-98.4 F (36.9 C)] 97.7 F (36.5 C) (06/16 0544) Pulse Rate:  [66-87] 66 (06/16 0544) Resp:  [18] 18 (06/16 0544) BP: (129-146)/(59-76) 146/74 (06/16 0544) SpO2:  [98 %-100 %] 100 % (06/16 0544) Weight:  [169 lb 15.6 oz (77.1 kg)] 169 lb 15.6 oz (77.1 kg) (06/16 0544) Weight change: 3 lb 9.2 oz (1.621 kg) Last BM Date: 12/31/16  Intake/Output from previous day: 06/15 0701 - 06/16 0700 In: 600 [P.O.:600] Out: -  Last cbgs: CBG (last 3)  No results for input(s): GLUCAP in the last 72 hours.   Physical Exam General: No apparent distress   HEENT: not dry Lungs: Normal effort. Lungs clear to auscultation, no crackles or wheezes. Cardiovascular: Regular rate and rhythm, no edema Abdomen: S/NT/ND; BS(+) Musculoskeletal:  unchanged Neurological: No new neurological deficits Wounds: N/A    Skin: clear  Aging changes Mental state: Alert, aphasic    Lab Results: BMET    Component Value Date/Time   NA 140 01/02/2017 0557   K 3.8 01/02/2017 0557   CL 108 01/02/2017 0557   CO2 24 01/02/2017 0557   GLUCOSE 119 (H) 01/02/2017 0557   BUN 19 01/02/2017 0557   CREATININE 1.19 (H) 01/02/2017 0557   CALCIUM 8.7 (L) 01/02/2017 0557   GFRNONAA 43 (L) 01/02/2017 0557   GFRAA 50 (L) 01/02/2017 0557   CBC    Component Value Date/Time   WBC 10.7 (H) 12/28/2016 0805   RBC 4.23 12/28/2016 0805   HGB 12.2 12/28/2016 0805   HCT 39.0 12/28/2016 0805   PLT 275 12/28/2016 0805   MCV 92.2 12/28/2016 0805   MCH 28.8 12/28/2016 0805   MCHC 31.3 12/28/2016 0805   RDW 14.9 12/28/2016 0805   LYMPHSABS 2.4 12/28/2016 0805   MONOABS 0.6 12/28/2016 0805   EOSABS 0.2 12/28/2016 0805   BASOSABS 0.0 12/28/2016 0805    Studies/Results: No results found.  Medications: I have reviewed the patient's  current medications.  Assessment/Plan:   1. R hemiparesis w/aphasia - CIR 2. L CVA - Eliquis, Zocor, Avapro, Toprol 3. DVT proph - Eliquis 4. A fib - Eliquis 5. HTN. Avapro, Lasix, Metoprolol 6. CHF - Avapro, Lasix    Length of stay, days: [place] , MD 01/02/2017, 1:41 PM

## 2017-01-02 NOTE — Progress Notes (Signed)
Occupational Therapy Session Note  Patient Details  Name: Valerie Mcclure MRN: [id number] Date of Birth: [birth year]  Today's Date: 01/02/2017 OT Individual Time: 1500-1530 OT Individual Time Calculation (min): 30 min    Short Term Goals: Week 2:  OT Short Term Goal 1 (Week 2): STG = LTGs due to remaining LOS  Skilled Therapeutic Interventions/Progress Updates:    Husband present at beginning of session and OT answered questions regarding d/c and interdisciplinary team aspect of CIR. Pt completes stand pivot transfer w/c<>EOM with MIN A for balance and VC for foot placement/ safety awareness. In standing and seated and crosses midline with CGA to obtain connect 4 pieces with LUE. Pt attempts to grasp pieces with RUE however unable to 2/2 decreased AROM/strength in RUE. While pt is seated reaching for pieces, pt weight bears through elbow/forearm to reach mod ranges outside BOS with LUE. Exited session with pt seated in w/c with call light in reach, QRB donned and husband entering room as OT exiting.  Therapy Documentation Precautions:  Precautions Precautions: Fall Precaution Comments: Rt hemiparesis, Rt inattention Restrictions Weight Bearing Restrictions: No  See Function Navigator for Current Functional Status.   Therapy/Group: Individual Therapy  Tonny Branch 01/02/2017, 3:43 PM

## 2017-01-03 ENCOUNTER — Encounter (HOSPITAL_COMMUNITY): Payer: Self-pay | Admitting: *Deleted

## 2017-01-03 ENCOUNTER — Inpatient Hospital Stay (HOSPITAL_COMMUNITY): Payer: Medicare Other

## 2017-01-03 NOTE — Progress Notes (Signed)
Occupational Therapy Session Note  Patient Details  Name: Valerie Mcclure MRN: [id number] Date of Birth: [birth year]  Today's Date: 01/03/2017 OT Individual Time: 2505-3976 OT Individual Time Calculation (min): 55 min    Short Term Goals: Week 2:  OT Short Term Goal 1 (Week 2): STG = LTGs due to remaining LOS  Skilled Therapeutic Interventions/Progress Updates:    1:1. Pt declines bathing and dressing this session. In ADL apartment pt stand pivot transfers w/c<>TTB 2x with MIN A for balance when pivoting to L and MOD A when pivoting to R. When pivoting to R pt demo greater posterior lean and difficulty managing legs. Pt stands at kitchen sink to wash dishes with MIN A for balance and VC for posture, foot placement and HOH A of RUE to facilitate functional grasp and movement patterns. In standing pt weight bears through RUE to complete 2x10 of towel glides of shoulder felx/ext, horizontal ab/adduc, circles and diagonals with MIN A for balance, weigh shifting and rotation of trunk with seated rest breaks. Exited session with pt seated in w/c with call light in reach and QRB donned.   Therapy Documentation Precautions:  Precautions Precautions: Fall Precaution Comments: Rt hemiparesis, Rt inattention Restrictions Weight Bearing Restrictions: No  See Function Navigator for Current Functional Status.   Therapy/Group: Individual Therapy  Tonny Branch 01/03/2017, 6:30 PM

## 2017-01-03 NOTE — Progress Notes (Signed)
Patient ID: Valerie Mcclure, female   DOB: 08-20-1940, 76 y.o.   MRN: [id number]   12/1716.  Valerie Mcclure is a 76 y.o. female admitted for CIR following a left MCA infarct with functional deficits secondary to right hemiparesis and aphasia  Subjective: Comfortable night.  No new complaints.  Remains alert and appropriate  Past Medical History:  Diagnosis Date  . Blood transfusion without reported diagnosis   . Chronic low back pain   . Hyperlipidemia   . Osteopenia   . Unspecified essential hypertension   . Unspecified hypothyroidism      Objective: Vital signs in last 24 hours: Temp:  [97.7 F (36.5 C)-98 F (36.7 C)] 98 F (36.7 C) (06/17 0409) Pulse Rate:  [69-71] 69 (06/17 0409) Resp:  [18] 18 (06/17 0409) BP: (129-138)/(57-73) 138/73 (06/17 0409) SpO2:  [100 %] 100 % (06/17 0409) Weight:  [169 lb 1.5 oz (76.7 kg)] 169 lb 1.5 oz (76.7 kg) (06/17 0409) Weight change: -14.1 oz (-0.4 kg) Last BM Date: 12/31/16  Intake/Output from previous day: 06/16 0701 - 06/17 0700 In: 300 [P.O.:300] Out: -   BP Readings from Last 3 Encounters:  01/03/17 138/73  12/23/16 (!) 152/80  12/19/16 (!) 160/83    Physical Exam General: No apparent distress   HEENT: not dry Lungs: Normal effort. Lungs clear to auscultation, no crackles or wheezes. Cardiovascular: Regular rate and rhythm, no edema Abdomen: S/NT/ND; BS(+) Musculoskeletal:  unchanged Neurological: Right-sided deficits Extremities-no edema Skin: clear   Mental state: Alert, oriented, cooperative    Lab Results: BMET    Component Value Date/Time   NA 140 01/02/2017 0557   K 3.8 01/02/2017 0557   CL 108 01/02/2017 0557   CO2 24 01/02/2017 0557   GLUCOSE 119 (H) 01/02/2017 0557   BUN 19 01/02/2017 0557   CREATININE 1.19 (H) 01/02/2017 0557   CALCIUM 8.7 (L) 01/02/2017 0557   GFRNONAA 43 (L) 01/02/2017 0557   GFRAA 50 (L) 01/02/2017 0557   CBC    Component Value Date/Time   WBC 10.7 (H)  12/28/2016 0805   RBC 4.23 12/28/2016 0805   HGB 12.2 12/28/2016 0805   HCT 39.0 12/28/2016 0805   PLT 275 12/28/2016 0805   MCV 92.2 12/28/2016 0805   MCH 28.8 12/28/2016 0805   MCHC 31.3 12/28/2016 0805   RDW 14.9 12/28/2016 0805   LYMPHSABS 2.4 12/28/2016 0805   MONOABS 0.6 12/28/2016 0805   EOSABS 0.2 12/28/2016 0805   BASOSABS 0.0 12/28/2016 0805    Studies/Results: No results found.  Medications: I have reviewed the patient's current medications.  Assessment/Plan:  Functional deficits secondary to left MCA infarct with right sided weakness Atrial fibrillation.  Continue rate control with metoprolol and anticoagulation with Eliquis Essential hypertension, stable Chronic diastolic heart failure.  Compensated    Length of stay, days: [place] , MD 01/03/2017, 11:32 AM

## 2017-01-04 ENCOUNTER — Inpatient Hospital Stay (HOSPITAL_COMMUNITY): Payer: Medicare Other

## 2017-01-04 ENCOUNTER — Inpatient Hospital Stay (HOSPITAL_COMMUNITY): Payer: Medicare Other | Admitting: Speech Pathology

## 2017-01-04 ENCOUNTER — Inpatient Hospital Stay (HOSPITAL_COMMUNITY): Payer: Medicare Other | Admitting: Physical Therapy

## 2017-01-04 ENCOUNTER — Inpatient Hospital Stay (HOSPITAL_COMMUNITY): Payer: Medicare Other | Admitting: Occupational Therapy

## 2017-01-04 NOTE — Progress Notes (Signed)
Occupational Therapy Session Note  Patient Details  Name: Valerie Mcclure MRN: [id number] Date of Birth: 10/04/1940  Today's Date: 01/04/2017 OT Individual Time: 2440-1027 OT Individual Time Calculation (min): 61 min    Short Term Goals: Week 2:  OT Short Term Goal 1 (Week 2): STG = LTGs due to remaining LOS  Skilled Therapeutic Interventions/Progress Updates:    Tx focus on Rt attention, Rt NMR and sitting balance during ADL tasks.   Pt greeted supine in bed, declining shower but agreeable to get dressed. Pt transitioning to EOB with min guard. She was able to thread Rt arm into correct sleeve with shirt placed in lap, min cues, and encouragement. Pt threading R LE into pants with extra time, required assist for L LE after attempting herself with extra time. Challenging due to combined balance demands. HOH Rt hand for lifting pants over hips standing at RW (Mod A sit<stand, manual facilitation for knee extension due to pt kicking her leg out during transition). Pt issued elastic shoe laces, able to don both shoes when placed on floor. Stand pivot to w/c with Mod A. She completed oral care/grooming tasks while seated, using R UE as gross assist with HOH. Max assist for word finding of ADL items with 0/3 recall after we reviewed them 5 minutes later. Pt left in w/c with safety belt and affected UE elevated. Spouse present at time of departure.    Therapy Documentation Precautions:  Precautions Precautions: Fall Precaution Comments: Rt hemiparesis, Rt inattention Restrictions Weight Bearing Restrictions: No    Pain: No c/o pain during tx    ADL:      See Function Navigator for Current Functional Status.   Therapy/Group: Individual Therapy  Meridee Branum A Joeangel Jeanpaul 01/04/2017, 12:25 PM

## 2017-01-04 NOTE — Progress Notes (Signed)
[facility] PHYSICAL MEDICINE & REHABILITATION     PROGRESS NOTE  Subjective/Complaints:  Pt seen laying in bed this AM.  He slept well overnight and had a good weekend. She wants to know if she is still on track for discharge this week.   ROS: Denies CP, SOB, N/V/D  Objective: Vital Signs: Blood pressure 118/60, pulse 80, temperature 97.7 F (36.5 C), temperature source Oral, resp. rate 18, height 5\' 3"  (1.6 m), weight 77.7 kg (171 lb 4.8 oz), SpO2 100 %. No results found. No results for input(s): WBC, HGB, HCT, PLT in the last 72 hours.  Recent Labs  01/02/17 0557  NA 140  K 3.8  CL 108  GLUCOSE 119*  BUN 19  CREATININE 1.19*  CALCIUM 8.7*   CBG (last 3)  No results for input(s): GLUCAP in the last 72 hours.  Wt Readings from Last 3 Encounters:  01/04/17 77.7 kg (171 lb 4.8 oz)  12/23/16 76.8 kg (169 lb 6.4 oz)  12/16/16 72.6 kg (160 lb)    Physical Exam:  BP 118/60 (BP Location: Right Arm)   Pulse 80   Temp 97.7 F (36.5 C) (Oral)   Resp 18   Ht 5\' 3"  (1.6 m)   Wt 77.7 kg (171 lb 4.8 oz)   SpO2 100%   BMI 30.34 kg/m  Constitutional: She appears well-developed and well-nourished. NAD. HENT: Normocephalic and atraumatic.  Eyes: EOMI. No discharge.  Cardiovascular: RRR. No JVD . Respiratory: CTA Bilaterally. Normal effort  GI: Soft. Bowel sounds are normal.  Musculoskeletal: She exhibits no edema or tenderness.  Neurological: She is alert and oriented.  Right facial weakness with predominantly expressive receptive aphasia, improving Motor: RUE: 4-/5 proximal to distal (stable) RLE: 4-/5 proximal to distal (stable) LUE/LLE: 5/5  Skin: Skin is warm and dry.  Psychiatric: She has a normal mood and affect. Her behavior is normal.   Assessment/Plan: 1. Functional deficits secondary to left MCA infarct which require 3+ hours per day of interdisciplinary therapy in a comprehensive inpatient rehab setting. Physiatrist is providing close team supervision and 24  hour management of active medical problems listed below. Physiatrist and rehab team continue to assess barriers to discharge/monitor patient progress toward functional and medical goals.  Function:  Bathing Bathing position Bathing activity did not occur: N/A (stated she'd bathed already) Position: Sitting EOB  Bathing parts Body parts bathed by patient: Right arm, Left arm, Front perineal area, Abdomen, Chest Body parts bathed by helper: Buttocks, Right upper leg, Left upper leg, Right lower leg, Left lower leg, Back  Bathing assist Assist Level: Touching or steadying assistance(Pt > 75%)      Upper Body Dressing/Undressing Upper body dressing Upper body dressing/undressing activity did not occur: N/A (stated she'd already dressed (only donned socks and shoes)) What is the patient wearing?: Pull over shirt/dress Bra - Perfomed by patient: Thread/unthread right bra strap Bra - Perfomed by helper: Thread/unthread left bra strap, Hook/unhook bra (pull down sports bra) Pull over shirt/dress - Perfomed by patient: Thread/unthread right sleeve, Thread/unthread left sleeve, Put head through opening, Pull shirt over trunk Pull over shirt/dress - Perfomed by helper: Thread/unthread right sleeve        Upper body assist Assist Level: Set up, Supervision or verbal cues   Set up : To obtain clothing/put away  Lower Body Dressing/Undressing Lower body dressing   What is the patient wearing?: Pants, Socks, Shoes     Pants- Performed by patient: Thread/unthread right pants leg, Thread/unthread left pants  leg Pants- Performed by helper: Thread/unthread right pants leg, Thread/unthread left pants leg, Pull pants up/down   Non-skid slipper socks- Performed by helper: Don/doff right sock, Don/doff left sock   Socks - Performed by helper: Don/doff right sock, Don/doff left sock Shoes - Performed by patient: Don/doff right shoe, Don/doff left shoe Shoes - Performed by helper: Fasten right, Fasten  left          Lower body assist Assist for lower body dressing: Touching or steadying assistance (Pt > 75%) (Max assist)      Toileting Toileting Toileting activity did not occur: N/A Toileting steps completed by patient: Performs perineal hygiene Toileting steps completed by helper: Adjust clothing prior to toileting, Adjust clothing after toileting Toileting Assistive Devices: Grab bar or rail  Toileting assist Assist level: Touching or steadying assistance (Pt.75%)   Transfers Chair/bed transfer   Chair/bed transfer method: Stand pivot Chair/bed transfer assist level: Moderate assist (Pt 50 - 74%/lift or lower) Chair/bed transfer assistive device: Mechanical lift Mechanical lift: Stedy   Locomotion Ambulation     Max distance:  (20 ft) Assist level: Moderate assist (Pt 50 - 74%)   Wheelchair     Max wheelchair distance:  (100 ft) Assist Level: Moderate assistance (Pt 50 - 74%)  Cognition Comprehension Comprehension assist level: Understands complex 90% of the time/cues 10% of the time  Expression Expression assist level: Expresses basic 50 - 74% of the time/requires cueing 25 - 49% of the time. Needs to repeat parts of sentences.  Social Interaction Social Interaction assist level: Interacts appropriately 75 - 89% of the time - Needs redirection for appropriate language or to initiate interaction.  Problem Solving Problem solving assist level: Solves basic 50 - 74% of the time/requires cueing 25 - 49% of the time  Memory Memory assist level: Recognizes or recalls 50 - 74% of the time/requires cueing 25 - 49% of the time    Medical Problem List and Plan: 1.  Right hemiparesis, global aphasia secondary to left MCA infarct.  Cont CIR 2.  DVT Prophylaxis/Anticoagulation: Eliquis. Monitor for any new neurological changes.  3. Pain Management: tylenol prn for now 4. Mood: No signs of distress--continue prozac. LCSW to follow for evaluation and support.  5. Neuropsych: This  patient is not fully capable of making decisions on her own behalf. 6. Skin/Wound Care: routine pressure measures 7. Fluids/Electrolytes/Nutrition: Monitor I/Os.   Now D3 thins.  8. AFib: Monitor HR bid--continue metoprolol and Eliquis 9. CKD ?III: ?Baseline SCR 1.3-1.4. Improved with hydration  Cr 1.19 on 6/16  Cont to monitor. Reviewed PO hydration with patient today   IVF d/ced 10. Encephalopathy: Resolved. Due to narcotic accumulation. 11. HTN: monitor BP bid.   Continue Lasix and metoprolol.   Avapro increased to 225 on 6/8, increased to 300 on 6/10  Overall controlled 6/18 12. Prediabetes: Educate on diet as cognition improves  Overall controlled 6/16 13. Diastolic Heart failure: On lasix, metoprolol, avapro and Zocor  Filed Weights   01/02/17 0544 01/03/17 0409 01/04/17 0431  Weight: 77.1 kg (169 lb 15.6 oz) 76.7 kg (169 lb 1.5 oz) 77.7 kg (171 lb 4.8 oz)  14. H/o anemia: Resolved  Monitor H/H as now on Eliquis. Monitor for any signs of bleeding.   Hb 12.2 on 6/11  Labs ordered for tomorrow  Cont to monitor 15. Hypoalbuminemia  Supplement initiated 6/7 16. Hypokalemia  K+ 3.8 on 6/16  Labs ordered for tomorrow  Supplementing daily  LOS (Days) 12 A FACE TO  FACE EVALUATION WAS PERFORMED  Valerie Mcclure Lorie Phenix 01/04/2017 9:38 AM

## 2017-01-04 NOTE — Progress Notes (Signed)
Speech Language Pathology Daily Session Note  Patient Details  Name: MAIRANY BRUNO MRN: [id number] Date of Birth: 1940/12/19  Today's Date: 01/04/2017 SLP Individual Time: 1000-1045 SLP Individual Time Calculation (min): 45 min  Short Term Goals: Week 2: SLP Short Term Goal 1 (Week 2): Pt will consume dys 2 textures and thin liquids with supervision verbal cues for use of swallowing precautions.   SLP Short Term Goal 2 (Week 2): Pt will recognize and correct verbal errors with mod assist verbal cues.  SLP Short Term Goal 3 (Week 2): Pt will produce accurate phrase level expression during functional tasks for >75% of opportunities with mod assist verbal cues.   SLP Short Term Goal 4 (Week 2): Pt will follow multi-unit commands to complete functional tasks with min assist verbal cues.   SLP Short Term Goal 5 (Week 2): Pt will consume therapeutic trials of dys 3 textures with supervision verbal cues to clear residue from the oral cavity.    Skilled Therapeutic Interventions: Skilled treatment session focused on addressing speech and swallow goals. SLP facilitated session with set-up and skilled observation of patient consuming thin liquids via cup and straw with with head neutral with no overt s/s of aspiration.  Advanced trials of regular textures were also attempted this session, but resulted in delayed cough.  Recommend to remove chin tuck and continue with current diet orders at this time.  Spouse present for session today and as a result, SLP focused on initiation of education regarding Dys.3 diet restrictions as well as safe swallow strategies: small bites and sips at a slow pace with handout provided for carryover.  SLP also facilitated session with education regarding effective communication strategies for self-monitoring and correcting of verbal errors, encourage spouse to practice some and ask questions this week prior to discharge.  Continue with current plan of care.     Function:  Eating Eating   Modified Consistency Diet: No (trials with SLP) Eating Assist Level: Set up assist for;Supervision or verbal cues   Eating Set Up Assist For: Opening containers       Cognition Comprehension Comprehension assist level: Understands basic 90% of the time/cues < 10% of the time  Expression   Expression assist level: Expresses basic 50 - 74% of the time/requires cueing 25 - 49% of the time. Needs to repeat parts of sentences.  Social Interaction Social Interaction assist level: Interacts appropriately 75 - 89% of the time - Needs redirection for appropriate language or to initiate interaction.  Problem Solving Problem solving assist level: Solves basic 50 - 74% of the time/requires cueing 25 - 49% of the time  Memory Memory assist level: Recognizes or recalls 50 - 74% of the time/requires cueing 25 - 49% of the time    Pain Pain Assessment Pain Assessment: No/denies pain  Therapy/Group: Individual Therapy  Carmelia Roller., [place] [phone]  [place] 01/04/2017, 4:07 PM

## 2017-01-04 NOTE — Progress Notes (Signed)
Physical Therapy Note  Patient Details  Name: Valerie Mcclure MRN: [id number] Date of Birth: 12/04/1940 Today's Date: 01/04/2017    Time: 1115-1200 45 minutes  1:1 No c/o pain.  Stair negotiation for strengthening and coordination 8 3'' steps x 2 with 1 handrail, mod cuing for technique and safety.  Occasional min A for Rt LE placement due to increased adductor tone.  Standing tapping forward and laterally with focus on Rt LE stance control with mod A, max tactile cuing for coordination and motor planning.  Seated Rt UE AAROM shoulder flex/ext/ER/IR with towel on table top with improving movement in all directions.  Pt states frequently throughout session how she is "just ready to go home"   [facility] 01/04/2017, 3:23 PM

## 2017-01-04 NOTE — Progress Notes (Signed)
Physical Therapy Session Note  Patient Details  Name: Valerie Mcclure MRN: [id number] Date of Birth: 1940-12-08  Today's Date: 01/04/2017 PT Individual Time: 1350-1430 PT Individual Time Calculation (min): 40 min    Skilled Therapeutic Interventions/Progress Updates:    Session started a few min late due to nursing to address bleed from IV site. Session focused on neuro re-ed to address RUE/RLE, postural control, gait, and balance activities. Pt able to gait without AD with mod assist with facilitation for weightshifting and cues for step length and slowed/controlled movement during turning. On Kinetron in standing, (with and without mirror for visual feedback) worked on equal weightbearing and forced weightbearing on RLE - pt difficulty with coordination of reciprocal movement pattern and maintaining upright posture (mod assist needed) x several repetitions until fatigue. In standing with 4" block under LLE for forced use of RLE while performing card matching task with cues for upright posture, activation of RLE musculature, and weightshifting pt required up to max assist to maintain position. Pt continues to demonstrate impaired postural control during mobility increasing fall risk. End of session transferred back to bed with focus on supine <> sit technique for efficiency of movement pattern with verbal and tactile cues (x 2 repetitions). Positioned with RUE supported on pillow for edema control and positioning due to decreased awareness of RUE.   Therapy Documentation Precautions:  Precautions Precautions: Fall Precaution Comments: Rt hemiparesis, Rt inattention Restrictions Weight Bearing Restrictions: No  Pain:  Denies pain.   See Function Navigator for Current Functional Status.   Therapy/Group: Individual Therapy  Valerie Mcclure, PT, DPT  01/04/2017, 2:59 PM

## 2017-01-05 ENCOUNTER — Inpatient Hospital Stay (HOSPITAL_COMMUNITY): Payer: Medicare Other | Admitting: Speech Pathology

## 2017-01-05 ENCOUNTER — Inpatient Hospital Stay (HOSPITAL_COMMUNITY): Payer: Medicare Other | Admitting: Physical Therapy

## 2017-01-05 ENCOUNTER — Inpatient Hospital Stay (HOSPITAL_COMMUNITY): Payer: Medicare Other | Admitting: Occupational Therapy

## 2017-01-05 LAB — BASIC METABOLIC PANEL
Anion gap: 9 (ref 5–15)
BUN: 16 mg/dL (ref 6–20)
CHLORIDE: 106 mmol/L (ref 101–111)
CO2: 26 mmol/L (ref 22–32)
CREATININE: 1.17 mg/dL — AB (ref 0.44–1.00)
Calcium: 8.8 mg/dL — ABNORMAL LOW (ref 8.9–10.3)
GFR calc Af Amer: 51 mL/min — ABNORMAL LOW (ref >60)
GFR calc non Af Amer: 44 mL/min — ABNORMAL LOW (ref >60)
GLUCOSE: 119 mg/dL — AB (ref 65–99)
Potassium: 3.6 mmol/L (ref 3.5–5.1)
SODIUM: 141 mmol/L (ref 135–145)

## 2017-01-05 LAB — CBC WITH DIFFERENTIAL/PLATELET
Basophils Absolute: 0 10*3/uL (ref 0.0–0.1)
Basophils Relative: 0 %
EOS PCT: 2 %
Eosinophils Absolute: 0.2 10*3/uL (ref 0.0–0.7)
HCT: 36.3 % (ref 36.0–46.0)
Hemoglobin: 11.5 g/dL — ABNORMAL LOW (ref 12.0–15.0)
LYMPHS ABS: 2 10*3/uL (ref 0.7–4.0)
LYMPHS PCT: 20 %
MCH: 29.1 pg (ref 26.0–34.0)
MCHC: 31.7 g/dL (ref 30.0–36.0)
MCV: 91.9 fL (ref 78.0–100.0)
MONO ABS: 0.5 10*3/uL (ref 0.1–1.0)
MONOS PCT: 5 %
Neutro Abs: 7.3 10*3/uL (ref 1.7–7.7)
Neutrophils Relative %: 73 %
PLATELETS: 272 10*3/uL (ref 150–400)
RBC: 3.95 MIL/uL (ref 3.87–5.11)
RDW: 14.6 % (ref 11.5–15.5)
WBC: 9.9 10*3/uL (ref 4.0–10.5)

## 2017-01-05 LAB — GLUCOSE, CAPILLARY: GLUCOSE-CAPILLARY: 112 mg/dL — AB (ref 65–99)

## 2017-01-05 NOTE — Progress Notes (Signed)
[place] PHYSICAL MEDICINE & REHABILITATION     PROGRESS NOTE  Subjective/Complaints:  Pt seen laying in bed this AM.  She slept well overnight.  She states she will drink more fluids.  ROS: Denies CP, SOB, N/V/D  Objective: Vital Signs: Blood pressure (!) 150/70, pulse 80, temperature 97.8 F (36.6 C), temperature source Oral, resp. rate 18, height 5\' 3"  (1.6 m), weight 66 kg (145 lb 8 oz), SpO2 99 %. No results found.  Recent Labs  01/05/17 0500  WBC 9.9  HGB 11.5*  HCT 36.3  PLT 272    Recent Labs  01/05/17 0500  NA 141  K 3.6  CL 106  GLUCOSE 119*  BUN 16  CREATININE 1.17*  CALCIUM 8.8*   CBG (last 3)   Recent Labs  01/05/17 0629  GLUCAP 112*    Wt Readings from Last 3 Encounters:  01/05/17 66 kg (145 lb 8 oz)  12/23/16 76.8 kg (169 lb 6.4 oz)  12/16/16 72.6 kg (160 lb)    Physical Exam:  BP (!) 150/70 (BP Location: Left Arm)   Pulse 80   Temp 97.8 F (36.6 C) (Oral)   Resp 18   Ht 5\' 3"  (1.6 m)   Wt 66 kg (145 lb 8 oz)   SpO2 99%   BMI 25.77 kg/m  Constitutional: She appears well-developed and well-nourished. NAD. HENT: Normocephalic and atraumatic.  Eyes: EOMI. No discharge.  Cardiovascular: RRR. No JVD . Respiratory: CTA Bilaterally. Normal effort  GI: Soft. Bowel sounds are normal.  Musculoskeletal: She exhibits no edema or tenderness.  Neurological: She is alert and oriented.  Right facial weakness with predominantly expressive receptive aphasia, continues to improve Motor: RUE: 4-/5 proximal to distal (stable) RLE: 4/5 proximal to distal  LUE/LLE: 5/5  Skin: Skin is warm and dry.  Psychiatric: She has a normal mood and affect. Her behavior is normal.   Assessment/Plan: 1. Functional deficits secondary to left MCA infarct which require 3+ hours per day of interdisciplinary therapy in a comprehensive inpatient rehab setting. Physiatrist is providing close team supervision and 24 hour management of active medical problems listed  below. Physiatrist and rehab team continue to assess barriers to discharge/monitor patient progress toward functional and medical goals.  Function:  Bathing Bathing position Bathing activity did not occur: N/A (stated she'd bathed already) Position: Sitting EOB  Bathing parts Body parts bathed by patient: Right arm, Left arm, Front perineal area, Abdomen, Chest Body parts bathed by helper: Buttocks, Right upper leg, Left upper leg, Right lower leg, Left lower leg, Back  Bathing assist Assist Level: Touching or steadying assistance(Pt > 75%)      Upper Body Dressing/Undressing Upper body dressing Upper body dressing/undressing activity did not occur: N/A (stated she'd already dressed (only donned socks and shoes)) What is the patient wearing?: Pull over shirt/dress Bra - Perfomed by patient: Thread/unthread right bra strap Bra - Perfomed by helper: Thread/unthread left bra strap, Hook/unhook bra (pull down sports bra) Pull over shirt/dress - Perfomed by patient: Thread/unthread left sleeve, Put head through opening, Pull shirt over trunk Pull over shirt/dress - Perfomed by helper: Thread/unthread right sleeve        Upper body assist Assist Level: Set up, Supervision or verbal cues   Set up : To obtain clothing/put away  Lower Body Dressing/Undressing Lower body dressing   What is the patient wearing?: Pants, Socks, Shoes     Pants- Performed by patient: Thread/unthread right pants leg Pants- Performed by helper: Thread/unthread left  pants leg, Pull pants up/down   Non-skid slipper socks- Performed by helper: Don/doff right sock, Don/doff left sock   Socks - Performed by helper: Don/doff right sock, Don/doff left sock Shoes - Performed by patient: Don/doff right shoe, Don/doff left shoe Shoes - Performed by helper: Fasten right, Fasten left          Lower body assist Assist for lower body dressing: Touching or steadying assistance (Pt > 75%) (Max assist)       Toileting Toileting Toileting activity did not occur: N/A Toileting steps completed by patient: Performs perineal hygiene Toileting steps completed by helper: Adjust clothing prior to toileting, Adjust clothing after toileting Toileting Assistive Devices: Grab bar or rail  Toileting assist Assist level: Touching or steadying assistance (Pt.75%)   Transfers Chair/bed transfer   Chair/bed transfer method: Stand pivot Chair/bed transfer assist level: Moderate assist (Pt 50 - 74%/lift or lower) Chair/bed transfer assistive device: Armrests Mechanical lift: Ecologist     Max distance:  (20 ft) Assist level: Moderate assist (Pt 50 - 74%)   Wheelchair     Max wheelchair distance:  (100 ft) Assist Level: Moderate assistance (Pt 50 - 74%)  Cognition Comprehension Comprehension assist level: Understands basic 90% of the time/cues < 10% of the time  Expression Expression assist level: Expresses basic 50 - 74% of the time/requires cueing 25 - 49% of the time. Needs to repeat parts of sentences.  Social Interaction Social Interaction assist level: Interacts appropriately 90% of the time - Needs monitoring or encouragement for participation or interaction.  Problem Solving Problem solving assist level: Solves basic 50 - 74% of the time/requires cueing 25 - 49% of the time  Memory Memory assist level: Recognizes or recalls 75 - 89% of the time/requires cueing 10 - 24% of the time    Medical Problem List and Plan: 1.  Right hemiparesis, global aphasia secondary to left MCA infarct.  Cont CIR 2.  DVT Prophylaxis/Anticoagulation: Eliquis. Monitor for any new neurological changes.  3. Pain Management: tylenol prn for now 4. Mood: No signs of distress--continue prozac. LCSW to follow for evaluation and support.  5. Neuropsych: This patient is not fully capable of making decisions on her own behalf. 6. Skin/Wound Care: routine pressure measures 7.  Fluids/Electrolytes/Nutrition: Monitor I/Os.   Now D3 thins.  8. AFib: Monitor HR bid--continue metoprolol and Eliquis 9. CKD III: ?Baseline SCR 1.3-1.4. Improved with hydration  Cr 1.17 on 6/19  Cont to monitor. Reviewed PO hydration with patient today   IVF d/ced 10. Encephalopathy: Resolved. Due to narcotic accumulation. 11. HTN: monitor BP bid.   Continue Lasix and metoprolol.   Avapro increased to 225 on 6/8, increased to 300 on 6/10  Elevated this AM, otherwise  12. Prediabetes: Educate on diet as cognition improves  Overall controlled 6/19 13. Diastolic Heart failure: On lasix, metoprolol, avapro and Zocor  Filed Weights   01/03/17 0409 01/04/17 0431 01/05/17 0426  Weight: 76.7 kg (169 lb 1.5 oz) 77.7 kg (171 lb 4.8 oz) 66 kg (145 lb 8 oz)  14. H/o anemia:   Monitor H/H as now on Eliquis. Monitor for any signs of bleeding.   Hb 11.5 on 6/19  Cont to monitor 15. Hypoalbuminemia  Supplement initiated 6/7 16. Hypokalemia  K+ 3.6 on 6/19  Supplementing daily  LOS (Days) 13 A FACE TO FACE EVALUATION WAS PERFORMED  Ankit Lorie Phenix 01/05/2017 9:16 AM

## 2017-01-05 NOTE — Progress Notes (Addendum)
Occupational Therapy Session Note  Patient Details  Name: Valerie Mcclure MRN: [id number] Date of Birth: 1940-10-05  Today's Date: 01/05/2017 OT Individual Time: 0906-1005 OT Individual Time Calculation (min): 59 min    Short Term Goals: Week 2:  OT Short Term Goal 1 (Week 2): STG = LTGs due to remaining LOS  Skilled Therapeutic Interventions/Progress Updates:    Pt presented sitting up in w/c ready for OT session. Pt agreeable to taking shower this AM. Completing stand pivot transfer w/c to/from tub bench with ModA using grab bars. Pt completed washing all body parts with setup assist and with assistance for washing buttocks and back. Pt requires Min steady assist during sit to stand during shower. Pt completed UB/LB dressing seated in w/c. Requires MinA for donning overhead shirt to thread R UE, verbal cues for using hemi techniques to complete. Pt completed LB dressing, demonstrating ability to thread bil LEs into pants without assist and with increased time, stood to pull up at [place] with MinA to steady during standing. Verbal cues required for safety as Pt attempting to stand with pant legs under feet and with R foot sliding on floor. Pt requires assist for donning socks, able to don shoes with close guard for safety. Completed grooming ADLs at sink from w/c level. Husband present end of session and providing verbal education on assistance needed for ADL completion at home. Pt left seated in w/c, trialing no QRB this date with RN aware and in agreement.   Therapy Documentation Precautions:  Precautions Precautions: Fall Precaution Comments: Rt hemiparesis, Rt inattention Restrictions Weight Bearing Restrictions: No   Pain: Pain Assessment Pain Assessment: Faces Faces Pain Scale: No hurt  See Function Navigator for Current Functional Status.   Therapy/Group: Individual Therapy  Raymondo Band 01/05/2017, 3:28 PM

## 2017-01-05 NOTE — Progress Notes (Signed)
Speech Language Pathology Daily Session Note  Patient Details  Name: Valerie Mcclure MRN: [id number] Date of Birth: 08-13-1940  Today's Date: 01/05/2017 SLP Individual Time: 0804-0900 SLP Individual Time Calculation (min): 56 min  Short Term Goals: Week 2: SLP Short Term Goal 1 (Week 2): Pt will consume dys 2 textures and thin liquids with supervision verbal cues for use of swallowing precautions.   SLP Short Term Goal 2 (Week 2): Pt will recognize and correct verbal errors with mod assist verbal cues.  SLP Short Term Goal 3 (Week 2): Pt will produce accurate phrase level expression during functional tasks for >75% of opportunities with mod assist verbal cues.   SLP Short Term Goal 4 (Week 2): Pt will follow multi-unit commands to complete functional tasks with min assist verbal cues.   SLP Short Term Goal 5 (Week 2): Pt will consume therapeutic trials of dys 3 textures with supervision verbal cues to clear residue from the oral cavity.    Skilled Therapeutic Interventions:  Pt was seen for skilled ST targeting cognitive-linguistic goals.  Pt's spontaneous verbal expression was much improved today and pt was able to request specific items when completing basic sinkside ADLs with supervision question cues to clarify.  Pt needed min question cues to ask for help as needed, specifically for opening containers and threading right arm through her shirt when donning a clean shirt.  Pt was able to name specific items in a generative, categorical naming task with min-mod assist verbal cues to monitor and correct verbal errors.  SLP facilitated the session with a trial snack of regular textures and thin liquids.  Pt consumed advanced solids and thin liquids via straw without overt s/s of aspiration.  Will plan to see pt for a trial meal tray of regular textures prior to upgrade.  Pt was left in wheelchair with husband at bedside.  Continue per current plan of care.       Function:  Eating Eating                  Cognition Comprehension Comprehension assist level: Understands basic 90% of the time/cues < 10% of the time  Expression   Expression assist level: Expresses basic 50 - 74% of the time/requires cueing 25 - 49% of the time. Needs to repeat parts of sentences.  Social Interaction Social Interaction assist level: Interacts appropriately 90% of the time - Needs monitoring or encouragement for participation or interaction.  Problem Solving Problem solving assist level: Solves basic 50 - 74% of the time/requires cueing 25 - 49% of the time  Memory Memory assist level: Recognizes or recalls 75 - 89% of the time/requires cueing 10 - 24% of the time    Pain Pain Assessment Pain Assessment: No/denies pain  Therapy/Group: Individual Therapy  [place] Dineen, Selinda Orion 01/05/2017, 9:02 AM

## 2017-01-05 NOTE — Progress Notes (Addendum)
Physical Therapy Note  Patient Details  Name: Valerie Mcclure MRN: [id number] Date of Birth: 1941/07/03 Today's Date: 01/05/2017    Session 1: 1130-1200 30 minutes  1:1 No c/o pain. Husband was present for the entire session. Focused on family education throughout session including transfers from w<>c to bed, w/c <> chair and w/c <> car. PT demonstrated transfers first and then husband was able to perform, all with min assist. Pt requires verbal cues for turning, foot placement and to slow down. Pt was also able to walk from w/c <> bed 8 ft with min A, x1 with PT and x 1 with husband. When in bed pt transferred from sitting <> supine with min assist to lift R LE and min assist to help scoot hips and shoulders, x 1 with PT and x 1 with husband.  Session 2:  1300-1345 45 minutes  1:1 No c/o pain.  PT demo'd and pt/husband performed w/c <> bed transfer with use of 3'' step which husband says he has at home to help her get into bed.  Pt/husband able to perform safely at min A level.  Gait training with min A with manual facilitation for Rt hip activation during stance phase, pt able to gait 110' x 2. Pt continues to require cues for safety with turning to sit.  Nu step for UE and LE NMR x 8 minutes level 3.  Pt's husband assists pt back to bed at end of session at min A level. Husband checked off to perform transfers in room.    Malon Branton 01/05/2017, 12:21 PM

## 2017-01-06 ENCOUNTER — Inpatient Hospital Stay (HOSPITAL_COMMUNITY): Payer: Medicare Other | Admitting: Physical Therapy

## 2017-01-06 ENCOUNTER — Encounter (HOSPITAL_COMMUNITY): Payer: Self-pay | Admitting: *Deleted

## 2017-01-06 ENCOUNTER — Ambulatory Visit (HOSPITAL_COMMUNITY): Payer: Medicare Other | Admitting: Speech Pathology

## 2017-01-06 ENCOUNTER — Inpatient Hospital Stay (HOSPITAL_COMMUNITY): Payer: Medicare Other | Admitting: Occupational Therapy

## 2017-01-06 DIAGNOSIS — I5032 Chronic diastolic (congestive) heart failure: Secondary | ICD-10-CM

## 2017-01-06 LAB — BASIC METABOLIC PANEL
ANION GAP: 9 (ref 5–15)
BUN: 16 mg/dL (ref 6–20)
CALCIUM: 9 mg/dL (ref 8.9–10.3)
CO2: 28 mmol/L (ref 22–32)
Chloride: 104 mmol/L (ref 101–111)
Creatinine, Ser: 1.23 mg/dL — ABNORMAL HIGH (ref 0.44–1.00)
GFR, EST AFRICAN AMERICAN: 48 mL/min — AB (ref >60)
GFR, EST NON AFRICAN AMERICAN: 41 mL/min — AB (ref >60)
GLUCOSE: 121 mg/dL — AB (ref 65–99)
Potassium: 3.8 mmol/L (ref 3.5–5.1)
SODIUM: 141 mmol/L (ref 135–145)

## 2017-01-06 MED ORDER — APIXABAN 5 MG PO TABS: 5.0000 mg | ORAL_TABLET | Freq: Two times a day (BID) | ORAL | 0 refills | Status: DC

## 2017-01-06 MED ORDER — FLUOXETINE HCL 20 MG PO CAPS: 20.0000 mg | ORAL_CAPSULE | Freq: Every day | ORAL | 11 refills | Status: DC

## 2017-01-06 MED ORDER — POTASSIUM CHLORIDE CRYS ER 20 MEQ PO TBCR: 20.0000 meq | EXTENDED_RELEASE_TABLET | Freq: Every day | ORAL | 0 refills | Status: DC

## 2017-01-06 MED ORDER — SIMVASTATIN 10 MG PO TABS: ORAL_TABLET | ORAL | 3 refills | Status: DC

## 2017-01-06 MED ORDER — NORTRIPTYLINE HCL 10 MG PO CAPS: 20.0000 mg | ORAL_CAPSULE | Freq: Every day | ORAL | 1 refills | Status: DC

## 2017-01-06 MED ORDER — METOPROLOL TARTRATE 25 MG PO TABS: 25.0000 mg | ORAL_TABLET | Freq: Two times a day (BID) | ORAL | 0 refills | Status: DC

## 2017-01-06 MED ORDER — FUROSEMIDE 20 MG PO TABS: 20.0000 mg | ORAL_TABLET | Freq: Every day | ORAL | 0 refills | Status: DC

## 2017-01-06 MED ORDER — VALSARTAN 160 MG PO TABS: 160.0000 mg | ORAL_TABLET | Freq: Every day | ORAL | 3 refills | Status: DC

## 2017-01-06 MED ORDER — LEVOTHYROXINE SODIUM 88 MCG PO TABS: 88.0000 ug | ORAL_TABLET | Freq: Every day | ORAL | 1 refills | Status: DC

## 2017-01-06 NOTE — Progress Notes (Signed)
Speech Language Pathology Discharge Summary  Patient Details  Name: Valerie Mcclure MRN: [id number] Date of Birth: 1940-12-27  Today's Date: 01/06/2017 SLP Individual Time: 0800-0900 SLP Individual Time Calculation (min): 60 min   Skilled Therapeutic Interventions:  Pt was seen for skilled ST targeting goals for dysphagia and communication.  Therapist facilitated the session with a trial meal tray of regular textures to continue working towards liquids advancement.  Pt utilized her swallowing precautions with supervision verbal cues to monitor and correct anterior labial loss.  Pt was able to effectively clear advanced solids from the oral cavity without difficulty or overt s/s of aspiration.  While pt's meal tray was not overly challenging in respect to textures, pt could verbalize safe swallowing strategies to use if certain textures are currently too difficult to chew and swallow, stating " I wouldn't eat it."  Therapist facilitated the session with a verbal sequencing task to address goals for output.  Pt needed min-mod assist verbal cues to monitor and correct verbal errors within task which is notably improved from previous attempts at task.  Pt was returned to room at the end of today's therapy session and left with call bell within reach.  Pt is ready for discharge tomorrow.      Patient has met 6 of 6 long term goals.  Patient to discharge at [facility] level.  Reasons goals not met:     Clinical Impression/Discharge Summary:   Pt has made functional gains and is discharging having met 6 out of 6 long term goals.  Pt is currently an overall min-mod assist for functional communication due to expressive>receptive aphasia with apraxia.  Pt has demonstrated improved ability to monitor and correct verbal errors in conversations but continues to struggle during structured tasks.  Pt's diet has been upgraded to regular textures and thin liquids which pt is consuming with supervision use of  swallowing precautions.  Pt and family education is complete at this time.  Pt is discharging home with 24/7 supervision from family and would benefit from [place] follow up at next level of care.   Care Partner:  Caregiver Able to Provide Assistance: Yes  Type of Caregiver Assistance: Physical;Cognitive  Recommendation:  Home Health SLP;Outpatient SLP;24 hour supervision/assistance  Rationale for SLP Follow Up: Maximize functional communication;Reduce caregiver burden;Maximize cognitive function and independence;Maximize swallowing safety   Equipment: none recommended by SLP    Reasons for discharge: Discharged from hospital   Patient/Family Agrees with Progress Made and Goals Achieved: Yes   Function:  Eating Eating   Modified Consistency Diet: No Eating Assist Level: Set up assist for;Supervision or verbal cues   Eating Set Up Assist For: Opening containers       Cognition Comprehension Comprehension assist level: Understands basic 90% of the time/cues < 10% of the time  Expression   Expression assist level: Expresses basic 75 - 89% of the time/requires cueing 10 - 24% of the time. Needs helper to occlude trach/needs to repeat words.  Social Interaction Social Interaction assist level: Interacts appropriately 90% of the time - Needs monitoring or encouragement for participation or interaction.  Problem Solving Problem solving assist level: Solves basic 75 - 89% of the time/requires cueing 10 - 24% of the time  Memory Memory assist level: Recognizes or recalls 75 - 89% of the time/requires cueing 10 - 24% of the time   Emilio Math 01/06/2017, 10:02 PM

## 2017-01-06 NOTE — Progress Notes (Signed)
Recreational Therapy Session Note  Patient Details  Name: Valerie Mcclure MRN: [id number] Date of Birth: October 12, 1940 Today's Date: 01/06/2017  Pain: no c/o Skilled Therapeutic Interventions/Progress Updates: Pt participated in animal assisted activity/therapy seated w/c level with supervision.  Pt self corrected errors during simple conversation with pet partner team with supervision and expressed excitement about tomorrow's discharge.    [place] 01/06/2017, 2:41 PM

## 2017-01-06 NOTE — Progress Notes (Signed)
Physical Therapy Discharge Summary  Patient Details  Name: Valerie Mcclure MRN: [id number] Date of Birth: [birth year]  Today's Date: 01/06/2017 PT Individual Time: 1345-1430 PT Individual Time Calculation (min): 45 min    Patient has met 7 of 7 long term goals due to improved activity tolerance, improved balance, improved postural control, increased strength, ability to compensate for deficits, functional use of  right upper extremity and right lower extremity, improved attention, improved awareness and improved coordination.  Patient to discharge at an ambulatory level Min Assist; recommend w/c for community mobility.   Patient's care partner is independent to provide the necessary physical and cognitive assistance at discharge.  All goals met.   Recommendation:  Patient will benefit from ongoing skilled PT services in home health setting to continue to advance safe functional mobility, address ongoing impairments in balance and mobility, and minimize fall risk.  Equipment: wheelchair with elevating leg rests  Reasons for discharge: treatment goals met and discharge from hospital  Patient/family agrees with progress made and goals achieved: Yes  PT Discharge Precautions/Restrictions Precautions Precautions: Fall Precaution Comments: Rt hemiparesis, Rt inattention Restrictions Weight Bearing Restrictions: No   Pain Pain Assessment Pain Assessment: No/denies pain   Vision/Perception  Perception Perception: Impaired Inattention/Neglect: Does not attend to right visual field;Does not attend to right side of body Praxis Praxis: Impaired Praxis Impairment Details: Motor planning  Cognition Overall Cognitive Status: Impaired/Different from baseline Arousal/Alertness: Awake/alert Orientation Level: Oriented X4 Attention: Selective;Alternating Sustained Attention: Appears intact Selective Attention: Appears intact Alternating Attention: Impaired Memory: Impaired Memory  Impairment: Decreased recall of new information;Decreased short term memory Decreased Short Term Memory: Verbal basic;Functional basic Awareness: Impaired Awareness Impairment: Emergent impairment Problem Solving: Impaired Problem Solving Impairment: Functional basic;Verbal basic Executive Function: Organizing Organizing: Impaired Behaviors: Poor frustration tolerance Safety/Judgment: Impaired Sensation Sensation Light Touch: Impaired Detail Light Touch Impaired Details: Impaired RLE;Impaired RUE Stereognosis: Impaired Detail Stereognosis Impaired Details: Impaired RUE Hot/Cold: Impaired by gross assessment Proprioception Impaired Details: Impaired RLE;Impaired RUE Coordination Gross Motor Movements are Fluid and Coordinated: No Fine Motor Movements are Fluid and Coordinated: No Coordination and Movement Description: improved use of right UE but requires min A for execution of motor task Motor  Motor Motor: Abnormal postural alignment and control;Hemiplegia Motor - Discharge Observations: improved postural alignment and motor control  Mobility Bed Mobility Bed Mobility: Supine to Sit;Sitting - Scoot to Edge of Bed;Sit to Supine Supine to Sit: 4: Min assist Supine to Sit Details: Verbal cues for sequencing;Tactile cues for sequencing Sitting - Scoot to Edge of Bed: 4: Min assist Sitting - Scoot to [employer] of Bed Details: Tactile cues for weight shifting;Verbal cues for sequencing Sit to Supine: 4: Min assist Sit to Supine - Details: Tactile cues for placement;Verbal cues for sequencing Transfers Transfers: Yes Sit to Stand: 4: Min assist Sit to Stand Details: Verbal cues for sequencing Stand to Sit: 4: Min assist Stand Pivot Transfers: 4: Min assist Stand Pivot Transfer Details: Verbal cues for sequencing;Tactile cues for weight shifting Locomotion  Ambulation Ambulation: Yes Ambulation/Gait Assistance: 4: Min assist Ambulation Distance (Feet): 50 Feet Ambulation/Gait  Assistance Details: Manual facilitation for weight shifting Gait Gait: Yes Gait Pattern: Step-to pattern;Decreased step length - left;Decreased stance time - right;Trendelenburg;Poor foot clearance - right Gait velocity: decreased Stairs / Additional Locomotion Stairs: Yes Stairs Assistance: 4: Min assist Stair Management Technique: One rail Left;Step to pattern Number of Stairs: 6 Height of Stairs: 3 Ramp: 4: Min assist Curb: 4: Min Administrator Mobility: No (not  applicable, caregiver will be pushing w/c)  Trunk/Postural Assessment  Cervical Assessment Cervical Assessment: Within Functional Limits Thoracic Assessment Thoracic Assessment:  (mild kyphosis ) Lumbar Assessment Lumbar Assessment: Within Functional Limits Postural Control Postural Control: Deficits on evaluation Righting Reactions: delayed  Balance Balance Balance Assessed: Yes Static Sitting Balance Static Sitting - Balance Support: Feet supported Static Sitting - Level of Assistance: 5: Stand by assistance Dynamic Sitting Balance Dynamic Sitting - Balance Support: Feet supported Dynamic Sitting - Level of Assistance: 5: Stand by assistance Dynamic Sitting - Balance Activities: Lateral lean/weight shifting;Forward lean/weight shifting;Reaching across midline Sitting balance - Comments: progressed to supervision  Static Standing Balance Static Standing - Balance Support: No upper extremity supported Static Standing - Level of Assistance: 4: Min assist Dynamic Standing Balance Dynamic Standing - Balance Support: No upper extremity supported Dynamic Standing - Level of Assistance: 4: Min assist Dynamic Standing - Balance Activities: Lateral lean/weight shifting;Forward lean/weight shifting;Reaching across midline Dynamic Standing - Comments: mod A  Extremity Assessment  RUE Assessment RUE Assessment: Exceptions to [facility] RUE AROM (degrees) Overall AROM Right Upper Extremity: Within  functional limits for tasks performed RUE Overall AROM Comments: WFL range against gravity  RUE Strength RUE Overall Strength Comments: Brunstrom V- impacted by poor attention and sensation RUE Tone RUE Tone: Modified [place] LUE Assessment LUE Assessment: Within Functional Limits RLE Assessment RLE Assessment: Exceptions to [facility] RLE Strength RLE Overall Strength: Deficits RLE Overall Strength Comments: weakness greater distally, hip abduction weakeness noted in functional tasks LLE Assessment LLE Assessment: Within Functional Limits   PT Interventions: Pt transferred from w/c to mat with min A, stand pivot transfer. Pt ambulated 50 ft with min A, facilitation to R glute med in order to reduce trendelenburg gait and activate glute med. Pt ascended/descended 8 steps, 3 inches using the L handrail and Min assist, verbal cues needed for foot placement. Pt transferred from w/c to car, ambulated up ramp and ambulated across uneven surface with min assist and verbal cues for sequencing. Pt transferred from w/c to bed with min assist, stand pivot transfer. Pt transferred from sitting EOB to supine with min assist in order to help lift R LE and to scoot in bed. Pt with worsening of aphasia and communication toward end of session, suspect d/t fatigue. Pt was left supine in bed with call bell in reach.   See Function Navigator for Current Functional Status.  Drema Dallas Schagen 01/06/2017, 2:05 PM

## 2017-01-06 NOTE — Patient Care Conference (Signed)
Inpatient RehabilitationTeam Conference and Plan of Care Update Date: 01/06/2017   Time: 2:30 PM    Patient Name: Valerie Mcclure      Medical Record Number: [id number]  Date of Birth: 1940/10/19 Sex: Female         Room/Bed: 4W05C/4W05C-01 Payor Info: Payor: [employer] MEDICARE / Plan: [employer] MEDICARE / Product Type: *No Product type* /    Admitting Diagnosis: CVA  Admit Date/Time:  12/23/2016  4:39 PM Admission Comments: No comment available   Primary Diagnosis:  <principal problem not specified> Principal Problem: <principal problem not specified>  Patient Active Problem List   Diagnosis Date Noted  . Chronic diastolic heart failure ([place])   . Dysarthria, post-stroke   . Hypoalbuminemia due to protein-calorie malnutrition ([place])   . Global aphasia   . Encephalopathy 12/23/2016  . Acute on chronic diastolic heart failure ([place])   . Acute blood loss anemia   . Tachypnea   . Toxic encephalopathy 12/22/2016  . Atrial flutter ([place]) 12/22/2016  . Hypokalemia 12/21/2016  . Obesity (BMI 30-39.9) 12/21/2016  . Narcotic overdose 12/19/2016  . Spastic hemiplegia of right dominant side as late effect of cerebral infarction ([place])   . E-coli UTI   . Anemia of chronic disease   . Benign essential HTN   . Dysphagia, post-stroke   . Non-fluent aphasia   . Anterior cerebral circulation hemorrhagic infarction ([place]) 12/03/2016  . PAC (premature atrial contraction) 12/03/2016  . Acute ischemic left MCA stroke ([place]) 12/03/2016  . Aphasia as late effect of stroke 12/03/2016  . Right hemiparesis ([place])   . Nontraumatic subcortical hemorrhage of left cerebral hemisphere ([place])   . Acute embolic stroke ([place]) 76/73/4193  . Mild aortic regurgitation 08/20/2016  . Bilateral edema of lower extremity 04/07/2016  . Pancreatic duct dilated 01/09/2016  . CKD (chronic kidney disease) 12/25/2015  . Mild tricuspid regurgitation 12/25/2015  . Mild mitral regurgitation 12/25/2015  . Prediabetes  06/10/2015  . Edema 06/04/2015  . Abdominal pain, chronic, right upper quadrant 06/04/2015  . Postmenopausal disorder 04/03/2014  . Chronic low back pain   . PAIN IN THORACIC SPINE 04/18/2010  . Coronary atherosclerosis 01/24/2009  . DEGENERATIVE JOINT DISEASE 01/23/2009  . ARTHRITIS, LEFT KNEE 12/13/2008  . HERNIATED LUMBAR DISC 12/13/2008  . Hypothyroidism 11/21/2007  . Hyperlipidemia 11/09/2007  . ANEMIA 11/09/2007  . Essential hypertension 11/09/2007  . Osteopenia 11/09/2007    Expected Discharge Date: Expected Discharge Date: 01/07/17  Team Members Present: Physician leading conference: Dr. Delice Lesch Social Worker Present: Lennart Pall, LCSW Nurse Present: Dorthula Nettles, RN PT Present: Jorge Mandril, PT OT Present: Willeen Cass, OT PPS Coordinator present : Daiva Nakayama, RN, CRRN     Current Status/Progress Goal Weekly Team Focus  Medical   Right hemiparesis, global aphasia secondary to left MCA infarct  Improve mobility, cognition, dysphagia  See above   Bowel/Bladder   Patient LBM 01/05/17.  Patient to remain continent of B/B while on IPR.  Continue to assess B/B needs and maintain continence of patient.   Swallow/Nutrition/ Hydration   Regular textures, thin liquids, full supervision   supervision   family education is complete    ADL's   overall min A with encouragement, stand step transfers with min A with extra time  Min assist  family education, d/c planning   Mobility   min A  min A  family ed, d/c planning   Communication   Min-mod assist   mod assist   family education is complete,  ready for discharge with family ed at next level of care   Safety/Cognition/ Behavioral Observations  mod A   mod A   family education is complete, pt is ready for discharge    Pain   Patient denies pain at this time.  Patient to have pain <= to 2/10 while on IPR.  Contine to monitor pain level and medicate PRN.   Skin   Patient w/ skin intact.  Slight MASD to peri area  - clearing with powder.  Patient to remain w/ skin clean/dry/intact.  Continue to monitor skin integrity q shift and PRN.    Rehab Goals Patient on target to meet rehab goals: Yes *See Care Plan and progress notes for long and short-term goals.  Barriers to Discharge: Mobiity, dysphagia, CKD, HTN, prediabetes, aphasia    Possible Resolutions to Barriers:  Therapies, follow BP, CKD stable    Discharge Planning/Teaching Needs:  Plan for pt to d/c home with spouse providing primary care.  Spouse to finalize education with OT session day of d/c.   Team Discussion:  Ready for d/c tomorrow with last fam ed session in morning.  Meeting goals  Revisions to Treatment Plan:  None   Continued Need for Acute Rehabilitation Level of Care: The patient requires daily medical management by a physician with specialized training in physical medicine and rehabilitation for the following conditions: Daily direction of a multidisciplinary physical rehabilitation program to ensure safe treatment while eliciting the highest outcome that is of practical value to the patient.: Yes Daily medical management of patient stability for increased activity during participation in an intensive rehabilitation regime.: Yes Daily analysis of laboratory values and/or radiology reports with any subsequent need for medication adjustment of medical intervention for : Neurological problems;Blood pressure problems;Renal problems;Other  Valerie Mcclure 01/07/2017, 9:03 AM

## 2017-01-06 NOTE — Progress Notes (Signed)
Occupational Therapy Discharge Summary  Patient Details  Name: Valerie Mcclure MRN: [id number] Date of Birth: 06-17-1941  Today's Date: 01/07/2017 OT Individual Time: 7858-8502 OT Individual Time Calculation (min): 56 min    OT treatment session completed with focus on family education during ADL completion in preparation for safe DC home. Pt completed UB/LB bathing, UB/LB dressing, and grooming ADLs with caregiver assist and intermittent MinA from therapist with therapist providing verbal cues and education throughout to increase safety and successful completion. Pt is overall MinA for bathing and dressing ADLs, requires setup/supervision for grooming ADLs. Educated spouse on encouraging Pt to continue to complete parts of ADL tasks as much as she is able to in order to continue increasing independence with task completion at home. Accompanied Pt and spouse to rehab apartment for transfer training to tub bench with Pt and spouse return demonstrating with good safety and follow through of instruction. Practiced transfer using stand pivot w/c to<>tub bench as well as using HHA to take approx 3-4 steps from doorway to tub bench (as spouse is unsure whether w/c will fit through bathroom door). Ended session with Pt sitting up in w/c in room, AFO donned to R LE, spouse present, call bell and needs within reach.   Patient has met 13 of 13 long term goals due to improved activity tolerance, improved balance, postural control, ability to compensate for deficits, functional use of  RIGHT upper extremity, improved attention and improved awareness.  Patient to discharge at [facility] Assist level.  Patient's care partner (spouse) is independent to provide the necessary physical and cognitive assistance at discharge.    Reasons goals not met: NA  Recommendation:  Patient will benefit from ongoing skilled OT services in home health setting to continue to advance functional skills in the area of BADL and  Reduce care partner burden.  Equipment: 3:1 BSC, tub bench  Reasons for discharge: treatment goals met  Patient/family agrees with progress made and goals achieved: Yes  OT Discharge Precautions/Restrictions  Precautions Precautions: Fall Precaution Comments: Rt hemiparesis, Rt inattention Restrictions Weight Bearing Restrictions: No General   Vital Signs  Pain Pain Assessment Pain Assessment: No/denies pain Pain Score: 0-No pain ADL ADL ADL Comments: see functional navigator Vision Baseline Vision/History: Wears glasses Wears Glasses: Reading only Patient Visual Report: No change from baseline Perception  Perception: Impaired Inattention/Neglect: Does not attend to right visual field;Does not attend to right side of body Praxis Praxis: Impaired Praxis Impairment Details: Motor planning Cognition Overall Cognitive Status: Impaired/Different from baseline Arousal/Alertness: Awake/alert Orientation Level: Oriented X4 Attention: Selective;Alternating Sustained Attention: Appears intact Selective Attention: Appears intact Alternating Attention: Impaired Memory: Impaired Memory Impairment: Decreased recall of new information;Decreased short term memory Decreased Short Term Memory: Verbal basic;Functional basic Awareness: Impaired Awareness Impairment: Emergent impairment Problem Solving: Impaired Problem Solving Impairment: Functional basic;Verbal basic Executive Function: Organizing Organizing: Impaired Behaviors: Poor frustration tolerance Safety/Judgment: Impaired Sensation Sensation Light Touch: Impaired Detail Light Touch Impaired Details: Impaired RLE;Impaired RUE Stereognosis: Impaired Detail Stereognosis Impaired Details: Impaired RUE Hot/Cold: Impaired by gross assessment Proprioception Impaired Details: Impaired RLE;Impaired RUE Coordination Gross Motor Movements are Fluid and Coordinated: No Fine Motor Movements are Fluid and Coordinated:  No Coordination and Movement Description: improved use of right UE but requires min A for execution of motor task Motor  Motor Motor: Abnormal postural alignment and control;Hemiplegia Motor - Discharge Observations: improved postural alignment and motor control Mobility  Transfers Transfers: Sit to Stand;Stand to Sit Sit to Stand: 4: Min assist Stand  to Sit: 4: Min assist  Trunk/Postural Assessment  Cervical Assessment Cervical Assessment: Within Functional Limits Thoracic Assessment Thoracic Assessment:  (mild kyphosis ) Lumbar Assessment Lumbar Assessment: Within Functional Limits Postural Control Postural Control: Deficits on evaluation Righting Reactions: delayed  Balance Balance Balance Assessed: Yes Dynamic Sitting Balance Sitting balance - Comments: progressed to supervision  Dynamic Standing Balance Dynamic Standing - Comments: mod A  Extremity/Trunk Assessment RUE Assessment RUE Assessment: Exceptions to [facility] RUE AROM (degrees) Overall AROM Right Upper Extremity: Within functional limits for tasks performed RUE Overall AROM Comments: WFL range against gravity  RUE Strength RUE Overall Strength Comments: [place] V- impacted by poor attention and sensation RUE Tone RUE Tone: Modified [place] LUE Assessment LUE Assessment: Within Functional Limits   See Function Navigator for Current Functional Status.  Willeen Cass [place], OT  01/07/2017  01/06/2017, 11:55 AM

## 2017-01-06 NOTE — Progress Notes (Signed)
[place] PHYSICAL MEDICINE & REHABILITATION     PROGRESS NOTE  Subjective/Complaints:  Pt seen sitting up in her chair this AM.  She slept well overnight.  She is eager to go home tomorrow.  ROS: Denies CP, SOB, N/V/D  Objective: Vital Signs: Blood pressure (!) 143/69, pulse (!) 56, temperature 97.5 F (36.4 C), temperature source Oral, resp. rate 16, height 5\' 3"  (1.6 m), weight 69 kg (152 lb 3.2 oz), SpO2 100 %. No results found.  Recent Labs  01/05/17 0500  WBC 9.9  HGB 11.5*  HCT 36.3  PLT 272    Recent Labs  01/05/17 0500 01/06/17 0529  NA 141 141  K 3.6 3.8  CL 106 104  GLUCOSE 119* 121*  BUN 16 16  CREATININE 1.17* 1.23*  CALCIUM 8.8* 9.0   CBG (last 3)   Recent Labs  01/05/17 0629  GLUCAP 112*    Wt Readings from Last 3 Encounters:  01/06/17 69 kg (152 lb 3.2 oz)  12/23/16 76.8 kg (169 lb 6.4 oz)  12/16/16 72.6 kg (160 lb)    Physical Exam:  BP (!) 143/69 (BP Location: Left Arm)   Pulse (!) 56   Temp 97.5 F (36.4 C) (Oral)   Resp 16   Ht 5\' 3"  (1.6 m)   Wt 69 kg (152 lb 3.2 oz)   SpO2 100%   BMI 26.96 kg/m  Constitutional: She appears well-developed and well-nourished. NAD. HENT: Normocephalic and atraumatic.  Eyes: EOMI. No discharge.  Cardiovascular: RRR. No JVD . Respiratory: CTA Bilaterally. Normal effort  GI: Soft. Bowel sounds are normal.  Musculoskeletal: She exhibits no edema or tenderness.  Neurological: She is alert and oriented.  Right facial weakness with predominantly expressive receptive aphasia, improving Motor: RUE: 4/5 proximal to distal  RLE: 4/5 proximal to distal LUE/LLE: 5/5  Skin: Skin is warm and dry.  Psychiatric: She has a normal mood and affect. Her behavior is normal.   Assessment/Plan: 1. Functional deficits secondary to left MCA infarct which require 3+ hours per day of interdisciplinary therapy in a comprehensive inpatient rehab setting. Physiatrist is providing close team supervision and 24 hour  management of active medical problems listed below. Physiatrist and rehab team continue to assess barriers to discharge/monitor patient progress toward functional and medical goals.  Function:  Bathing Bathing position Bathing activity did not occur: N/A (stated she'd bathed already) Position: Shower  Bathing parts Body parts bathed by patient: Right arm, Left arm, Abdomen, Chest, Front perineal area, Right upper leg, Left upper leg, Right lower leg, Left lower leg Body parts bathed by helper: Buttocks, Back  Bathing assist Assist Level: Touching or steadying assistance(Pt > 75%)      Upper Body Dressing/Undressing Upper body dressing Upper body dressing/undressing activity did not occur: N/A (stated she'd already dressed (only donned socks and shoes)) What is the patient wearing?: Pull over shirt/dress, Bra Bra - Perfomed by patient: Thread/unthread right bra strap, Thread/unthread left bra strap Bra - Perfomed by helper: Hook/unhook bra (pull down sports bra) Pull over shirt/dress - Perfomed by patient: Thread/unthread left sleeve, Put head through opening, Pull shirt over trunk Pull over shirt/dress - Perfomed by helper: Thread/unthread right sleeve        Upper body assist Assist Level: Touching or steadying assistance(Pt > 75%)   Set up : To obtain clothing/put away  Lower Body Dressing/Undressing Lower body dressing   What is the patient wearing?: Pants, Socks, Shoes     Pants- Performed by patient:  Thread/unthread right pants leg, Thread/unthread left pants leg, Pull pants up/down Pants- Performed by helper: Thread/unthread left pants leg, Pull pants up/down   Non-skid slipper socks- Performed by helper: Don/doff right sock, Don/doff left sock   Socks - Performed by helper: Don/doff right sock, Don/doff left sock Shoes - Performed by patient: Don/doff right shoe, Don/doff left shoe Shoes - Performed by helper: Fasten right, Fasten left          Lower body assist  Assist for lower body dressing: Touching or steadying assistance (Pt > 75%) (assist to steady while pulling up pants )      Toileting Toileting Toileting activity did not occur: N/A Toileting steps completed by patient: Performs perineal hygiene Toileting steps completed by helper: Adjust clothing prior to toileting, Adjust clothing after toileting Toileting Assistive Devices: Grab bar or rail  Toileting assist Assist level: Touching or steadying assistance (Pt.75%)   Transfers Chair/bed transfer   Chair/bed transfer method: Stand pivot Chair/bed transfer assist level: Moderate assist (Pt 50 - 74%/lift or lower) Chair/bed transfer assistive device: Armrests Mechanical lift: Ecologist     Max distance:  (20 ft) Assist level: Moderate assist (Pt 50 - 74%)   Wheelchair     Max wheelchair distance:  (100 ft) Assist Level: Moderate assistance (Pt 50 - 74%)  Cognition Comprehension Comprehension assist level: Understands basic 90% of the time/cues < 10% of the time  Expression Expression assist level: Expresses basic 50 - 74% of the time/requires cueing 25 - 49% of the time. Needs to repeat parts of sentences.  Social Interaction Social Interaction assist level: Interacts appropriately 90% of the time - Needs monitoring or encouragement for participation or interaction.  Problem Solving Problem solving assist level: Solves basic 75 - 89% of the time/requires cueing 10 - 24% of the time  Memory Memory assist level: Recognizes or recalls 75 - 89% of the time/requires cueing 10 - 24% of the time    Medical Problem List and Plan: 1.  Right hemiparesis, global aphasia secondary to left MCA infarct.  Cont CIR 2.  DVT Prophylaxis/Anticoagulation: Eliquis. Monitor for any new neurological changes.  3. Pain Management: tylenol prn for now 4. Mood: No signs of distress--continue prozac. LCSW to follow for evaluation and support.  5. Neuropsych: This patient is not fully  capable of making decisions on her own behalf. 6. Skin/Wound Care: routine pressure measures 7. Fluids/Electrolytes/Nutrition: Monitor I/Os.   Now D3 thins.  8. AFib: Monitor HR bid--continue metoprolol and Eliquis 9. CKD ?III: ?Baseline SCR 1.3-1.4. Improved with hydration  Cr 1.23 on 6/20  Cont to monitor. Reviewed PO hydration with patient today   IVF d/ced 10. Encephalopathy: Resolved. Due to narcotic accumulation. 11. HTN: monitor BP bid.   Continue Lasix and metoprolol.   Avapro increased to 225 on 6/8, increased to 300 on 6/10  Overall controlled 6/20 12. Prediabetes: Educate on diet as cognition improves  Overall controlled 6/20 13. Diastolic Heart failure: On lasix, metoprolol, avapro and Zocor  Filed Weights   01/04/17 0431 01/05/17 0426 01/06/17 0500  Weight: 77.7 kg (171 lb 4.8 oz) 66 kg (145 lb 8 oz) 69 kg (152 lb 3.2 oz)  14. H/o anemia: Resolved  Monitor H/H as now on Eliquis. Monitor for any signs of bleeding.   Hb 11.5 on 6/19  Cont to monitor 15. Hypoalbuminemia  Supplement initiated 6/7 16. Hypokalemia  K+ 3.8 on 6/20  Supplementing daily  LOS (Days) 14 A FACE TO FACE  EVALUATION WAS PERFORMED  Ankit Lorie Phenix 01/06/2017 1:06 PM

## 2017-01-07 ENCOUNTER — Inpatient Hospital Stay (HOSPITAL_COMMUNITY): Payer: Medicare Other | Admitting: Occupational Therapy

## 2017-01-07 NOTE — Plan of Care (Signed)
Problem: Consults Goal: RH STROKE PATIENT EDUCATION See Patient Education module for education specifics   Outcome: Progressing Verbalizes understanding

## 2017-01-07 NOTE — Progress Notes (Signed)
[place] PHYSICAL MEDICINE & REHABILITATION     PROGRESS NOTE  Subjective/Complaints:  Pt seen laying in bed this AM.  She slept well overnight and is really looking forward to going home.   ROS: Denies CP, SOB, N/V/D  Objective: Vital Signs: Blood pressure (!) 140/53, pulse 72, temperature 97.4 F (36.3 C), temperature source Oral, resp. rate 16, height 5\' 3"  (1.6 m), weight 69.3 kg (152 lb 11.2 oz), SpO2 100 %. No results found.  Recent Labs  01/05/17 0500  WBC 9.9  HGB 11.5*  HCT 36.3  PLT 272    Recent Labs  01/05/17 0500 01/06/17 0529  NA 141 141  K 3.6 3.8  CL 106 104  GLUCOSE 119* 121*  BUN 16 16  CREATININE 1.17* 1.23*  CALCIUM 8.8* 9.0   CBG (last 3)   Recent Labs  01/05/17 0629  GLUCAP 112*    Wt Readings from Last 3 Encounters:  01/07/17 69.3 kg (152 lb 11.2 oz)  12/23/16 76.8 kg (169 lb 6.4 oz)  12/16/16 72.6 kg (160 lb)    Physical Exam:  BP (!) 140/53 (BP Location: Left Arm)   Pulse 72   Temp 97.4 F (36.3 C) (Oral)   Resp 16   Ht 5\' 3"  (1.6 m)   Wt 69.3 kg (152 lb 11.2 oz)   SpO2 100%   BMI 27.05 kg/m  Constitutional: She appears well-developed and well-nourished. NAD. HENT: Normocephalic and atraumatic.  Eyes: EOMI. No discharge.  Cardiovascular: RRR. No JVD . Respiratory: CTA Bilaterally. Normal effort  GI: Soft. Bowel sounds are normal.  Musculoskeletal: She exhibits no edema or tenderness.  Neurological: She is alert and oriented.  Right facial weakness with predominantly expressive receptive aphasia, improving Motor: RUE: 4/5 proximal to distal  RLE: 4/5 proximal to distal (improving) LUE/LLE: 5/5  No increase in tone Skin: Skin is warm and dry.  Psychiatric: She has a normal mood and affect. Her behavior is normal.   Assessment/Plan: 1. Functional deficits secondary to left MCA infarct which require 3+ hours per day of interdisciplinary therapy in a comprehensive inpatient rehab setting. Physiatrist is providing  close team supervision and 24 hour management of active medical problems listed below. Physiatrist and rehab team continue to assess barriers to discharge/monitor patient progress toward functional and medical goals.  Function:  Bathing Bathing position Bathing activity did not occur: N/A (stated she'd bathed already) Position: Shower  Bathing parts Body parts bathed by patient: Right arm, Left arm, Abdomen, Chest, Front perineal area, Right upper leg, Left upper leg, Right lower leg, Left lower leg Body parts bathed by helper: Buttocks, Back  Bathing assist Assist Level: Touching or steadying assistance(Pt > 75%)      Upper Body Dressing/Undressing Upper body dressing Upper body dressing/undressing activity did not occur: N/A (stated she'd already dressed (only donned socks and shoes)) What is the patient wearing?: Pull over shirt/dress, Bra Bra - Perfomed by patient: Thread/unthread right bra strap, Thread/unthread left bra strap Bra - Perfomed by helper: Hook/unhook bra (pull down sports bra) Pull over shirt/dress - Perfomed by patient: Thread/unthread left sleeve, Put head through opening, Pull shirt over trunk Pull over shirt/dress - Perfomed by helper: Thread/unthread right sleeve        Upper body assist Assist Level: Touching or steadying assistance(Pt > 75%)   Set up : To obtain clothing/put away  Lower Body Dressing/Undressing Lower body dressing   What is the patient wearing?: Pants, Socks, Shoes     Pants- Performed  by patient: Thread/unthread right pants leg, Thread/unthread left pants leg, Pull pants up/down Pants- Performed by helper: Thread/unthread left pants leg, Pull pants up/down   Non-skid slipper socks- Performed by helper: Don/doff right sock, Don/doff left sock   Socks - Performed by helper: Don/doff right sock, Don/doff left sock Shoes - Performed by patient: Don/doff right shoe, Don/doff left shoe Shoes - Performed by helper: Fasten right, Fasten left           Lower body assist Assist for lower body dressing: Touching or steadying assistance (Pt > 75%) (assist to steady while pulling up pants )      Toileting Toileting Toileting activity did not occur: N/A Toileting steps completed by patient: Performs perineal hygiene Toileting steps completed by helper: Adjust clothing prior to toileting, Adjust clothing after toileting Toileting Assistive Devices: Grab bar or rail  Toileting assist Assist level: Touching or steadying assistance (Pt.75%)   Transfers Chair/bed transfer   Chair/bed transfer method: Stand pivot Chair/bed transfer assist level: Touching or steadying assistance (Pt > 75%) Chair/bed transfer assistive device: Armrests Mechanical lift: Ecologist     Max distance: 50 Assist level: Touching or steadying assistance (Pt > 75%)   Wheelchair Wheelchair activity did not occur: N/A (Pt will not self propel at home) Type: Manual Max wheelchair distance:  (100 ft) Assist Level: Moderate assistance (Pt 50 - 74%)  Cognition Comprehension Comprehension assist level: Understands basic 90% of the time/cues < 10% of the time  Expression Expression assist level: Expresses basic 75 - 89% of the time/requires cueing 10 - 24% of the time. Needs helper to occlude trach/needs to repeat words.  Social Interaction Social Interaction assist level: Interacts appropriately 90% of the time - Needs monitoring or encouragement for participation or interaction.  Problem Solving Problem solving assist level: Solves basic 75 - 89% of the time/requires cueing 10 - 24% of the time  Memory Memory assist level: Recognizes or recalls 75 - 89% of the time/requires cueing 10 - 24% of the time    Medical Problem List and Plan: 1.  Right hemiparesis, global aphasia secondary to left MCA infarct.  D/c today  Will see patient in 1-2 weeks for transitional care management 2.  DVT Prophylaxis/Anticoagulation: Eliquis. Monitor for any  new neurological changes.  3. Pain Management: tylenol prn for now 4. Mood: No signs of distress--continue prozac. LCSW to follow for evaluation and support.  5. Neuropsych: This patient is not fully capable of making decisions on her own behalf. 6. Skin/Wound Care: routine pressure measures 7. Fluids/Electrolytes/Nutrition: Monitor I/Os.   Now regular diet  8. AFib: Monitor HR bid--continue metoprolol and Eliquis 9. CKD ?III: ?Baseline SCR 1.3-1.4. Improved with hydration  Cr 1.23 on 6/20  Cont to monitor. Reviewed PO hydration with patient today   IVF d/ced 10. Encephalopathy: Resolved. Due to narcotic accumulation. 11. HTN: monitor BP bid.   Continue Lasix and metoprolol.   Avapro increased to 225 on 6/8, increased to 300 on 6/10  Overall controlled 6/21 12. Prediabetes: Educate on diet as cognition improves  Overall controlled 6/20 13. Diastolic Heart failure: On lasix, metoprolol, avapro and Zocor  Filed Weights   01/05/17 0426 01/06/17 0500 01/07/17 0428  Weight: 66 kg (145 lb 8 oz) 69 kg (152 lb 3.2 oz) 69.3 kg (152 lb 11.2 oz)  14. H/o anemia: Resolved  Monitor H/H as now on Eliquis. Monitor for any signs of bleeding.   Hb 11.5 on 6/19  Cont to monitor  15. Hypoalbuminemia  Supplement initiated 6/7 16. Hypokalemia  K+ 3.8 on 6/20  Supplementing daily  LOS (Days) 15 A FACE TO FACE EVALUATION WAS PERFORMED  Hattie Pine Lorie Phenix 01/07/2017 8:28 AM

## 2017-01-07 NOTE — Progress Notes (Signed)
Occupational Therapy Note  Patient Details  Name: Valerie Mcclure MRN: [id number] Date of Birth: Mar 14, 1941  Today's Date: 01/06/2017 OT Individual Time:  - 11:15-12:00  Late entry for 01/06/17  Pt already bathed and dressed and reported NT assisted her.  Focus on transitioning into new w/c to ensure the fit and provide simple education on safety with it.  Husband not present to complete ADL education with today.  Setup for Thursday prior to d/c.    NMR focus on right UE at table top sitting EOM. Pt required mod VC for visual attention to hand and min A to lift hand against gravity at times. Pt with decr frustration tolerance- benefiting from encouragement. Addressed grasp and release, functional reach, lateral key pinch and pincher grasp, etc. Pt able to perform stand step transfers with min A with extra time for motor coordination and balance.   Left in new w/c in room in prep for lunch.    No reports of pain       Willeen Cass [facility] 01/07/2017, 7:13 AM

## 2017-01-07 NOTE — Progress Notes (Signed)
Patient and husband received discharge instructions from Marlowe Shores, PA-C with verbal understanding. Patient discharged to home with husband and belongings.

## 2017-01-07 NOTE — Progress Notes (Signed)
Social Work  Discharge Note  The overall goal for the admission was met for:   Discharge location: Yes- home with spouse as primary support w/ additional assist from pt's daughter  Length of Stay: Yes - 15 days  Discharge activity level: Yes - min assist overall  Home/community participation: Yes  Services provided included: MD, RD, PT, OT, SLP, RN, TR, Pharmacy, Neuropsych and SW  Financial Services: [employer] Medicare  Follow-up services arranged: Home Health: PT, OT, ST via [facility], DME: 18x18 lightweight w/c with 1/2 lap tray, basic cusnion, SBQC, 3n1 commode and tub bench via [facility] and Patient/Family has no preference for HH/DME agencies  Comments (or additional information):  Patient/Family verbalized understanding of follow-up arrangements: Yes  Individual responsible for coordination of the follow-up plan: pt/ spouse  Confirmed correct DME delivered: ,  01/07/2017    ,  

## 2017-01-07 NOTE — Progress Notes (Signed)
Physical Therapy Session Note  Patient Details  Name: Valerie Mcclure MRN: [id number] Date of Birth: [birth year]  Today's Date: 01/06/2017 PT Individual Time: 7793-9030   09 min   Short Term Goals: Week 3:  PT Short Term Goal 1 (Week 3): STG=LTG   Skilled Therapeutic Interventions/Progress Updates:   Pt received sitting in WC and agreeable to PT  Pt transported to rehab gym in [facility]. Gait training without AD x 122ft with min assist overlal and mod assist x1 due to LOB in turn to door. Significant trendelenburg noted with gait as well as scissoring with RLE. PT instructed pt in gait with RW x 78ft with min assist. No decrease noted trendelenburg gait pattern and no added safety in turns.   Pt declined WC mobility due to fatigue.   PT transported pt to room. Stand pivot transfer to bed with min assist from PT and min assist to return to supine in bed.   Supine NMR for hip abduction x 8 and clam shells x 8 as well as bridges x 5. Moderate cues from PT for decreased compensation of movements.    pt left supine in bed with all needs ned.      Therapy Documentation Precautions:  Precautions Precautions: Fall Precaution Comments: Rt hemiparesis, Rt inattention Restrictions Weight Bearing Restrictions: No Vital Signs: Therapy Vitals Temp: 97.4 F (36.3 C) Temp Source: Oral Pulse Rate: 72 Resp: 16 BP: (!) 140/53 Patient Position (if appropriate): Lying Oxygen Therapy SpO2: 100 % O2 Device: Not Delivered Pain: 0/10   See Function Navigator for Current Functional Status.   Therapy/Group: Individual Therapy  Lorie Phenix 01/07/2017, 5:37 AM

## 2017-01-08 ENCOUNTER — Telehealth: Payer: Self-pay | Admitting: *Deleted

## 2017-01-08 NOTE — Discharge Summary (Signed)
Physician Discharge Summary  Patient ID: Valerie Mcclure MRN: [id number] DOB/AGE: 1941/02/18 76 y.o.  Admit date: 12/23/2016 Discharge date: 01/07/2017  Discharge Diagnoses:  Principal Problem:   Acute ischemic left MCA stroke [facility]) Active Problems:   CKD (chronic kidney disease)   Benign essential HTN   Hypokalemia   Acute blood loss anemia   Hypoalbuminemia due to protein-calorie malnutrition ([name])   Global aphasia   Dysarthria, post-stroke   Chronic diastolic heart failure ([name])   Discharged Condition: good  Significant Diagnostic Studies: Ct Head Wo Contrast  Result Date: 12/20/2016 CLINICAL DATA:  Acute onset of mental deterioration. Initial encounter. EXAM: CT HEAD WITHOUT CONTRAST TECHNIQUE: Contiguous axial images were obtained from the base of the skull through the vertex without intravenous contrast. COMPARISON:  CT of the head performed earlier today at 4:16 p.m. FINDINGS: Brain: The evolving subacute left MCA territory infarct is grossly unchanged from the recent prior study, with partial effacement of the left lateral ventricle, and approximately 2 mm of rightward shift. Previously noted hemorrhage transformation at the left basal ganglia is stable in appearance. No new infarcts are identified. There is no evidence of mass lesion. There is no evidence of hydrocephalus at this time. Mild cerebellar atrophy is noted. The brainstem and fourth ventricle are unremarkable in appearance. Vascular: No hyperdense vessel or unexpected calcification. Skull: There is no evidence of fracture; visualized osseous structures are unremarkable in appearance. Sinuses/Orbits: The orbits are within normal limits. The paranasal sinuses and mastoid air cells are well-aerated. Other: No significant soft tissue abnormalities are seen. IMPRESSION: Evolving subacute left MCA territory infarct is grossly unchanged from the recent prior study, with partial effacement of the left lateral ventricle, and  approximately 2 mm of rightward shift. Previously noted hemorrhagic transformation at the left basal ganglia is stable in appearance. Electronically Signed   By: Garald Balding M.D.   On: 12/20/2016 00:21            Labs:  Basic Metabolic Panel:  Recent Labs Lab 01/05/17 0500 01/06/17 0529  NA 141 141  K 3.6 3.8  CL 106 104  CO2 26 28  GLUCOSE 119* 121*  BUN 16 16  CREATININE 1.17* 1.23*  CALCIUM 8.8* 9.0    CBC:  Recent Labs Lab 01/05/17 0500  WBC 9.9  NEUTROABS 7.3  HGB 11.5*  HCT 36.3  MCV 91.9  PLT 272    CBG:  Recent Labs Lab 01/05/17 0629  GLUCAP 112*    Brief HPI:   Valerie Mcclure a 76 y.o.femalewith history of chronic abdominal and LBP, CKD, hyperlipidemia who fell out of bed on 5/13 am and was found to have left facial weakness with slurred speech and right sided weakness. History taken form chart review and daughter. Work up revealed hyperdense L-MCA sign with left M1 occlusion due to acute thrombus or embolus with significant penumbra left temporoparietal lobe and irregular non-calcified plaque L-ICA. She underwent cerebral angio with complete revascularization of L-MCA with reperfusion with hemorrhagic transformation of left temporal infarct and minimal SDH. MRI brain revealed L-MCA infarct with diffusion restriction left temporal lobe, superimposed 5.5 cm intra-axial hemorrhage centered at left basal ganglia with associated left hemisphere edema, effaced left lateral ventricle with trace IVH and occasional small foci of restriction in left thalamus and anterior left occipital lobe. Dr. Erlinda Hong felt that stroke embolic due to unknown source and to start ASA on 5/24 for secondary stroke prevention. There was question of new onset A fib but Dr. Acie Fredrickson  felt that patient with multifocal PACs and to have 30 day loop recorder placed at discharge. She was started on D1, honey by teaspoon due to dysphagia. Patient with resultant right sided weakness with right  lean, right inattention, aphasia, motor apraxia and DOE. CIR was recommended for follow up therapy.   She was admitted to CIR on 12/03/16 and was maintained on ASA for secondary stroke prevention. She was started on Bactrim for E coli UTI and renal status has been monitored serially and has been stable.  Blood pressures were noted to be labile and Avapro was titrated upwards.  Pain has been reasonable controlled on MS contin bid. Her diet was advanced to dysphagia 2, nectar liquids with water protocol on 5/22. Baclofen was added briefly to help manage spasticity but was discontinued due to sedation. Therapy has been focused on right inattention as well as weight bearing RLE. She has required cues for sequencing ADL tasks as well as mobility. She required min to mod assist with ADL tasks and mobility. She required supervision with cues to maintain aspiration precautions and functional communication was limited due to global aphasia with apraxia. On 6/2, she developed somnolence with hypoxia. She responded to multiple doses of narcan and stat CT head reviewed, showing improvement in midline shift. She developed agitation post narcan administration and required transfer to ICU. She was started on narcan drip for acute encephalopathy due to narcotic overdose. Mentation improved and narcan drip discontinued by 6/3. Work up negative and diet resumed. She developed atrial flutter with variable AV block and was started on metoprolol for rate control. Repeat CT 6/2 head showed near resolution of hematoma.  Dr. Erlinda Hong recommended resuming Eliquis with  close neuro monitoring and stat CT head prn acute changes to rule out ICH. Therapy resumed and patient was cleared to resume her rehab course.    Hospital Course: Valerie Mcclure was readmitted to rehab 12/23/2016 to resume  inpatient therapies consisting of PT, ST and OT at least three hours five days a week. Past admission physiatrist, therapy team and rehab RN have worked  together to provide customized collaborative inpatient rehab. She was maintained on Eliquis and has been neurologically stable. Heart rate has been controlled on metoprolol. Blood pressures have been monitored on bid basis and avapro was titrated upwards for tighter control, Follow up CBC showed H/H to be improving and check of lytes showed persistent hypokalemia. She was started on Kdur for  Supplement and hypokalemia has resolved. Repeat MBS was done on 6/8 and diet was advanced to dysphagia 2, thin liquids with chin tuck. She was briefly hydrated for acute on chronic renal failure and was encouraged to increase fluid intake. IVF were discontinued on 6/18 and follow up BMET showed improved in pre renal azotemia and SCr is at baseline. Po intake has been good and her  diet was advanced to regular by discharge. She has had decrease in right inattention and improvement in verbal output. She has progressed to min assist level and will continue to receive follow up HHPT,HHOT and HHRN by [facility] at home after discharge.    Rehab course: During patient's stay in rehab weekly team conferences were held to monitor patient's progress, set goals and discuss barriers to discharge. At admission, patient required max assist for mobility and total assist with basic self care tasks. She was showing improvement in expressive deficits with > 80% accuracy to name items and was intermittently able to have episodes of clear speech. She continued to  demonstrated perseverative speech with jargon and echolalia. She has had improvement in activity tolerance, balance, postural control, as well as ability to compensate for deficits. She is has had improvement in functional use RUE  and RLE as well as improved awareness She is able to complete ADL tasks with moderate verbal cues to incorporate RUE.  She is able to perform transfers with min assist and is able to ambulate 100' with min assist and no AD needed. She was showing improvement  in ability to monitor and correct verbal errors in conversation level. She required supervision to use safe swallow strategies. Family education was completed with husband regarding all aspects of mobility, self care and cues for expressive deficits.     Disposition: 01-Home or Self Care  Diet: Mechanical soft  Special Instructions: 1. Needs 24 hours supervision.    Allergies as of 01/07/2017      Reactions   Shrimp [shellfish Allergy]    Break outs and swelling    Tandem Plus [fefum-fepo-fa-b Cmp-[id number]] Nausea And Vomiting   Ivp Dye [iodinated Diagnostic Agents] Rash   itching   Penicillins Rash   itching      Medication List    TAKE these medications   apixaban 5 MG Tabs tablet Commonly known as:  ELIQUIS Take 1 tablet (5 mg total) by mouth 2 (two) times daily.   CALCIUM 1200 PO Take 1 tablet by mouth daily.   FLUoxetine 20 MG capsule Commonly known as:  PROZAC Take 1 capsule (20 mg total) by mouth daily. What changed:  how much to take   furosemide 20 MG tablet Commonly known as:  LASIX Take 1 tablet (20 mg total) by mouth daily. What changed:  See the new instructions.   levothyroxine 88 MCG tablet Commonly known as:  SYNTHROID, LEVOTHROID Take 1 tablet (88 mcg total) by mouth daily.   metoprolol tartrate 25 MG tablet Commonly known as:  LOPRESSOR Take 1 tablet (25 mg total) by mouth 2 (two) times daily. What changed:  how much to take   nortriptyline 10 MG capsule Commonly known as:  PAMELOR Take 2 capsules (20 mg total) by mouth at bedtime. What changed:  See the new instructions.   potassium chloride SA 20 MEQ tablet Commonly known as:  K-DUR,KLOR-CON Take 1 tablet (20 mEq total) by mouth daily.   simvastatin 10 MG tablet Commonly known as:  ZOCOR TAKE 1 TABLET(10 MG) BY MOUTH DAILY   valsartan 160 MG tablet Commonly known as:  DIOVAN Take 1 tablet (160 mg total) by mouth daily.   vitamin B-12 100 MCG tablet Commonly known as:   CYANOCOBALAMIN Take 100 mcg by mouth daily.   Vitamin D3 1000 units Caps Take 1,000 Units by mouth daily.      Follow-up Information    Jamse Arn, MD Follow up.   Specialty:  Physical Medicine and Rehabilitation Why:  office will call you with follow up appointment Contact information: [address] [place] [address] [phone]        Rosalin Hawking, MD. Call in 1 day(s).   Specialty:  Neurology Why:  for follow up appointment Contact information: [place] [id number] [phone]        [name], MD Follow up on 01/11/2017.   Specialty:  Internal Medicine Why:  @ 1:00 pm (hospital follow up appt) Contact information: [address] [place] [address] [phone]  SignedBary Leriche 01/11/2017, 10:26 PM

## 2017-01-08 NOTE — Telephone Encounter (Signed)
Transitional Care call-I spoke with Mr Aaronson    1. Are you/is patient experiencing any problems since coming home? Are there any questions regarding any aspect of care?NO 2. Are there any questions regarding medications administration/dosing? Are meds being taken as prescribed? Patient should review meds with caller to confirm They have all medications 3. Have there been any falls? NO 4. Has Home Health been to the house and/or have they contacted you? If not, have you tried to contact them? Can we help you contact them? Yes they have made contact and will begin Saturday 5. Are bowels and bladder emptying properly? Are there any unexpected incontinence issues? If applicable, is patient following bowel/bladder programs? No problems 6. Any fevers, problems with breathing, unexpected pain? NO 7. Are there any skin problems or new areas of breakdown?NO 8. Has the patient/family member arranged specialty MD follow up (ie cardiology/neurology/renal/surgical/etc)?  Can we help arrange?Appointment given today for Dr Posey Pronto  9. Does the patient need any other services or support that we can help arrange? NO 10. Are caregivers following through as expected in assisting the patient?YES 11. Has the patient quit smoking, drinking alcohol, or using drugs as recommended? N/A  Appointment Thursday 01/14/17 @ 1:20  Arrive by 1:00 with Dr Posey Pronto Address reviewed [address]

## 2017-01-09 DIAGNOSIS — I4891 Unspecified atrial fibrillation: Secondary | ICD-10-CM | POA: Diagnosis not present

## 2017-01-09 DIAGNOSIS — Z993 Dependence on wheelchair: Secondary | ICD-10-CM | POA: Diagnosis not present

## 2017-01-09 DIAGNOSIS — I5030 Unspecified diastolic (congestive) heart failure: Secondary | ICD-10-CM | POA: Diagnosis not present

## 2017-01-09 DIAGNOSIS — N183 Chronic kidney disease, stage 3 (moderate): Secondary | ICD-10-CM | POA: Diagnosis not present

## 2017-01-09 DIAGNOSIS — I69320 Aphasia following cerebral infarction: Secondary | ICD-10-CM | POA: Diagnosis not present

## 2017-01-09 DIAGNOSIS — I130 Hypertensive heart and chronic kidney disease with heart failure and stage 1 through stage 4 chronic kidney disease, or unspecified chronic kidney disease: Secondary | ICD-10-CM | POA: Diagnosis not present

## 2017-01-09 DIAGNOSIS — Z9181 History of falling: Secondary | ICD-10-CM | POA: Diagnosis not present

## 2017-01-09 DIAGNOSIS — I69351 Hemiplegia and hemiparesis following cerebral infarction affecting right dominant side: Secondary | ICD-10-CM | POA: Diagnosis not present

## 2017-01-10 NOTE — Progress Notes (Signed)
Subjective:    Patient ID: Valerie Mcclure, female    DOB: 1941-01-13, 76 y.o.   MRN: [id number]  HPI The patient is here for follow up from the hospital.  Admitted 11/29/16 - 12/03/16 for acute left MCA infarct s/p mechanical thrombectomy.   She woke on the morning of 5/13 with aphasia and right hemiplegia.  A Ct scan showed hyperdense left MCA and hypodensity in the left temporal area.  CTA confirmed left MCA clot.  She did not receive IV t-PA due to delay in arrival.  She was taken to interventional neuroradiology where angiogram showed left MCA occlusion.  She had TICI3 revascularization with mecahical thrmobectomy.  She had a post IR hemorrhage.  She was admitted to neuro ICU.    A 2D echo showed an EF 55-60%, LE doppler showed no DVT.  Tele showed frequent PACs - cardiology recommended a 30 day loop recorder after discharge.  Planned to start ASA 325 after 7 days due to [place].  a1c was slightly elevated at 5.8%.  LDL 85.  Aggressive stroke risk factor management recommended.    Hypertension:  Was stable in the hospital.  She was restarted on lopressor and diovan.   Hyperlipidemia, LDL 85:  She was not on a statin at home, but was started on Zocor 20 mg.    Exam on discharge:  Moderate expressive aphasia, able to follow commands, naming 2/4 and difficulty with repetition, right facial droop.  RUE proximal 3/5, tricep 2/5, bicep and finger movement 0/5, RLE 4/5 proximal and 3/5 distal.  She was discharged to [facility] inpatient rehab for PT, OT and ST.    Admitted to rehab 12/03/16 - 12/19/16.  she was placed on bactrim for an e coli UTI.   renal status monitored and stable.  BP labile and avapro was titrated.  Pain controlled with chronic morphine.  Diet was advance to dysphagia 2.  Baclofen added for spasticity but then d/c'd due to sedation.  On 6/2 she became somnolent with hypoxia.  She responded to multiple doses of narcan and stat CT showed improvement in midline shift.  Due to lethargy  and apnea she was transferred to ICU from rehab.  Admitted to ICU 12/19/16 - 12/23/16 for toxic encephalopathy.  She was started on a narcan drip.  This was weaned.  There was no evidence of infection or acute CVA.  On telemetry she was in aflutter, HR controlled, but occasionally 110.  She was started on metoprolol.  Neurology recommended starting eliquis.  This was likely the cause of her earlier embolic stroke.  She went back to rehab and was discharged home on 01/07/17.  She will have PT, OT and ST at home.   Since coming home her daughter and husband have noticed increased spasm of right leg, worsening of gait/balance and speech is not has good. She is more tired. She has not had a bowel movement since leaving the hospital. She is eating some, but not much.  She was not eating much when she was in the hospital either.  Her urine is clear - no cloudiness or foul smell. She has not been coughing.  She is sleeping well.      Medications and allergies reviewed with patient and updated if appropriate.  Patient Active Problem List   Diagnosis Date Noted  . Chronic diastolic heart failure ([place])   . Dysarthria, post-stroke   . Hypoalbuminemia due to protein-calorie malnutrition ([place])   . Global aphasia   .  Encephalopathy 12/23/2016  . Acute on chronic diastolic heart failure ([place])   . Acute blood loss anemia   . Tachypnea   . Toxic encephalopathy 12/22/2016  . Atrial flutter ([place]) 12/22/2016  . Hypokalemia 12/21/2016  . Obesity (BMI 30-39.9) 12/21/2016  . Narcotic overdose 12/19/2016  . Spastic hemiplegia of right dominant side as late effect of cerebral infarction ([place])   . E-coli UTI   . Anemia of chronic disease   . Benign essential HTN   . Dysphagia, post-stroke   . Non-fluent aphasia   . Anterior cerebral circulation hemorrhagic infarction ([place]) 12/03/2016  . PAC (premature atrial contraction) 12/03/2016  . Acute ischemic left MCA stroke ([place]) 12/03/2016  . Aphasia as late effect of  stroke 12/03/2016  . Right hemiparesis ([place])   . Nontraumatic subcortical hemorrhage of left cerebral hemisphere ([place])   . Acute embolic stroke ([place]) 74/94/4967  . Mild aortic regurgitation 08/20/2016  . Bilateral edema of lower extremity 04/07/2016  . Pancreatic duct dilated 01/09/2016  . CKD (chronic kidney disease) 12/25/2015  . Mild tricuspid regurgitation 12/25/2015  . Mild mitral regurgitation 12/25/2015  . Prediabetes 06/10/2015  . Edema 06/04/2015  . Abdominal pain, chronic, right upper quadrant 06/04/2015  . Postmenopausal disorder 04/03/2014  . Chronic low back pain   . PAIN IN THORACIC SPINE 04/18/2010  . Coronary atherosclerosis 01/24/2009  . DEGENERATIVE JOINT DISEASE 01/23/2009  . ARTHRITIS, LEFT KNEE 12/13/2008  . HERNIATED LUMBAR DISC 12/13/2008  . Hypothyroidism 11/21/2007  . Hyperlipidemia 11/09/2007  . ANEMIA 11/09/2007  . Essential hypertension 11/09/2007  . Osteopenia 11/09/2007    Current Outpatient Prescriptions on File Prior to Visit  Medication Sig Dispense Refill  . apixaban (ELIQUIS) 5 MG TABS tablet Take 1 tablet (5 mg total) by mouth 2 (two) times daily. 60 tablet 0  . Calcium Carbonate-Vit D-Min (CALCIUM 1200 PO) Take 1 tablet by mouth daily.     . Cholecalciferol (VITAMIN D3) 1000 UNITS CAPS Take 1,000 Units by mouth daily.     Marland Kitchen FLUoxetine (PROZAC) 20 MG capsule Take 1 capsule (20 mg total) by mouth daily. 30 capsule 11  . furosemide (LASIX) 20 MG tablet Take 1 tablet (20 mg total) by mouth daily. 30 tablet 0  . levothyroxine (SYNTHROID, LEVOTHROID) 88 MCG tablet Take 1 tablet (88 mcg total) by mouth daily. 90 tablet 1  . metoprolol tartrate (LOPRESSOR) 25 MG tablet Take 1 tablet (25 mg total) by mouth 2 (two) times daily. 60 tablet 0  . nortriptyline (PAMELOR) 10 MG capsule Take 2 capsules (20 mg total) by mouth at bedtime. 180 capsule 1  . potassium chloride SA (K-DUR,KLOR-CON) 20 MEQ tablet Take 1 tablet (20 mEq total) by mouth daily. 30 tablet  0  . simvastatin (ZOCOR) 10 MG tablet TAKE 1 TABLET(10 MG) BY MOUTH DAILY 90 tablet 3  . valsartan (DIOVAN) 160 MG tablet Take 1 tablet (160 mg total) by mouth daily. 90 tablet 3  . vitamin B-12 (CYANOCOBALAMIN) 100 MCG tablet Take 100 mcg by mouth daily.     No current facility-administered medications on file prior to visit.     Past Medical History:  Diagnosis Date  . Blood transfusion without reported diagnosis   . Chronic low back pain   . Hyperlipidemia   . Osteopenia   . Unspecified essential hypertension   . Unspecified hypothyroidism     Past Surgical History:  Procedure Laterality Date  . ABDOMINAL HYSTERECTOMY  1970  . CHOLECYSTECTOMY  07/2009   Dr. Rise Patience  .  COLONOSCOPY  2003  . FLEXIBLE SIGMOIDOSCOPY  2010  . HAND SURGERY    . IR ANGIO INTRA EXTRACRAN SEL COM CAROTID INNOMINATE UNI L MOD SED  11/29/2016  . IR ANGIO VERTEBRAL SEL SUBCLAVIAN INNOMINATE UNI R MOD SED  11/29/2016  . IR PERCUTANEOUS ART THROMBECTOMY/INFUSION INTRACRANIAL INC DIAG ANGIO  11/29/2016  . LUMBAR LAMINECTOMY  11/2008   Done by Dr. Patrice Paradise  . RADIOLOGY WITH ANESTHESIA N/A 11/29/2016   Procedure: RADIOLOGY WITH ANESTHESIA;  Surgeon: Radiologist, Medication, MD;  Location: [facility];  Service: Radiology;  Laterality: N/A;  . THYROIDECTOMY      Social History   Social History  . Marital status: Married    Spouse name: N/A  . Number of children: N/A  . Years of education: N/A   Social History Main Topics  . Smoking status: Never Smoker  . Smokeless tobacco: Never Used  . Alcohol use No  . Drug use: No  . Sexual activity: Not Currently   Other Topics Concern  . Not on file   Social History Narrative   Married    Family History  Problem Relation Age of Onset  . Heart disease Father 45       MI age 31s  . Lung cancer Brother 21  . Colon cancer Neg Hx   . Esophageal cancer Neg Hx   . Rectal cancer Neg Hx   . Stomach cancer Neg Hx     Review of Systems  Constitutional: Positive  for appetite change (decreased) and fever (low grade yesterday 99.7). Negative for chills.  Respiratory: Positive for shortness of breath (with exertion). Negative for cough and wheezing.   Cardiovascular: Positive for leg swelling (mild, left). Negative for chest pain and palpitations.  Gastrointestinal: Positive for abdominal pain (chronic) and constipation. Negative for blood in stool.  Genitourinary: Negative for dysuria and hematuria.       No change in color and odor  Neurological: Negative for dizziness, light-headedness, numbness and headaches.  Psychiatric/Behavioral: Negative for sleep disturbance.       Objective:   Vitals:   01/11/17 1300  BP: 104/60  Pulse: 76  Resp: 16  Temp: 98.1 F (36.7 C)   Wt Readings from Last 3 Encounters:  01/07/17 152 lb 11.2 oz (69.3 kg)  12/23/16 169 lb 6.4 oz (76.8 kg)  12/16/16 160 lb (72.6 kg)   There is no height or weight on file to calculate BMI.   Physical Exam    Constitutional: Appears well-developed and well-nourished. No distress. Falling asleep during the visit HENT:  Head: Normocephalic and atraumatic.  Neck: Neck supple. No tracheal deviation present. No thyromegaly present.  No cervical lymphadenopathy Cardiovascular: Normal rate, irregular rhythm and normal heart sounds.   No murmur heard. No carotid bruit .  No edema Pulmonary/Chest: Effort normal and breath sounds normal. No respiratory distress. No has no wheezes. No rales.  Abdomen: soft, non tender, non distended Neuro:  RUE proximal 3/5, distal 4/5; asphasic; RLE 3/5; spasms in RLE Skin: Skin is warm and dry. Not diaphoretic.  Psychiatric: Normal mood and affect. Behavior is normal.      Assessment & Plan:    See Problem List for Assessment and Plan of chronic medical problems.

## 2017-01-10 NOTE — Patient Instructions (Addendum)
CT scan was ordered today.      Medications reviewed and updated.   No changes recommended at this time.    Please followup in 2-3 months

## 2017-01-11 ENCOUNTER — Other Ambulatory Visit: Payer: Self-pay

## 2017-01-11 ENCOUNTER — Ambulatory Visit (HOSPITAL_COMMUNITY)
Admission: RE | Admit: 2017-01-11 | Discharge: 2017-01-11 | Disposition: A | Discharge status: Home / Self Care | Payer: Medicare Other | Source: Ambulatory Visit | Attending: Internal Medicine | Admitting: Internal Medicine

## 2017-01-11 ENCOUNTER — Encounter (HOSPITAL_COMMUNITY): Payer: Self-pay

## 2017-01-11 ENCOUNTER — Telehealth: Payer: Self-pay | Admitting: *Deleted

## 2017-01-11 ENCOUNTER — Other Ambulatory Visit: Payer: Self-pay | Admitting: *Deleted

## 2017-01-11 ENCOUNTER — Encounter (HOSPITAL_COMMUNITY): Payer: Self-pay | Admitting: Emergency Medicine

## 2017-01-11 ENCOUNTER — Inpatient Hospital Stay (HOSPITAL_COMMUNITY)
Admission: EM | Admit: 2017-01-11 | Discharge: 2017-01-14 | DRG: 065 | Disposition: A | Discharge status: Rehabilitation Facility | Payer: Medicare Other | Attending: Internal Medicine | Admitting: Internal Medicine

## 2017-01-11 ENCOUNTER — Ambulatory Visit (INDEPENDENT_AMBULATORY_CARE_PROVIDER_SITE_OTHER): Payer: Medicare Other | Admitting: Internal Medicine

## 2017-01-11 ENCOUNTER — Telehealth: Payer: Self-pay | Admitting: Internal Medicine

## 2017-01-11 ENCOUNTER — Encounter: Payer: Self-pay | Admitting: Internal Medicine

## 2017-01-11 VITALS — BP 104/60 | HR 76 | Temp 98.1°F | Resp 16

## 2017-01-11 DIAGNOSIS — I10 Essential (primary) hypertension: Secondary | ICD-10-CM

## 2017-01-11 DIAGNOSIS — I63512 Cerebral infarction due to unspecified occlusion or stenosis of left middle cerebral artery: Principal | ICD-10-CM | POA: Diagnosis present

## 2017-01-11 DIAGNOSIS — R5381 Other malaise: Secondary | ICD-10-CM | POA: Diagnosis not present

## 2017-01-11 DIAGNOSIS — I480 Paroxysmal atrial fibrillation: Secondary | ICD-10-CM | POA: Diagnosis not present

## 2017-01-11 DIAGNOSIS — Z79899 Other long term (current) drug therapy: Secondary | ICD-10-CM | POA: Diagnosis not present

## 2017-01-11 DIAGNOSIS — R4702 Dysphasia: Secondary | ICD-10-CM

## 2017-01-11 DIAGNOSIS — T45516A Underdosing of anticoagulants, initial encounter: Secondary | ICD-10-CM | POA: Diagnosis not present

## 2017-01-11 DIAGNOSIS — E785 Hyperlipidemia, unspecified: Secondary | ICD-10-CM | POA: Diagnosis present

## 2017-01-11 DIAGNOSIS — N183 Chronic kidney disease, stage 3 (moderate): Secondary | ICD-10-CM | POA: Diagnosis present

## 2017-01-11 DIAGNOSIS — G8191 Hemiplegia, unspecified affecting right dominant side: Secondary | ICD-10-CM | POA: Diagnosis present

## 2017-01-11 DIAGNOSIS — I459 Conduction disorder, unspecified: Secondary | ICD-10-CM | POA: Diagnosis not present

## 2017-01-11 DIAGNOSIS — M8580 Other specified disorders of bone density and structure, unspecified site: Secondary | ICD-10-CM | POA: Diagnosis not present

## 2017-01-11 DIAGNOSIS — Z91013 Allergy to seafood: Secondary | ICD-10-CM | POA: Diagnosis not present

## 2017-01-11 DIAGNOSIS — Z880 Allergy status to penicillin: Secondary | ICD-10-CM

## 2017-01-11 DIAGNOSIS — Z8249 Family history of ischemic heart disease and other diseases of the circulatory system: Secondary | ICD-10-CM | POA: Diagnosis not present

## 2017-01-11 DIAGNOSIS — D638 Anemia in other chronic diseases classified elsewhere: Secondary | ICD-10-CM | POA: Diagnosis not present

## 2017-01-11 DIAGNOSIS — R404 Transient alteration of awareness: Secondary | ICD-10-CM | POA: Diagnosis not present

## 2017-01-11 DIAGNOSIS — I6789 Other cerebrovascular disease: Secondary | ICD-10-CM | POA: Diagnosis not present

## 2017-01-11 DIAGNOSIS — I69351 Hemiplegia and hemiparesis following cerebral infarction affecting right dominant side: Secondary | ICD-10-CM | POA: Diagnosis not present

## 2017-01-11 DIAGNOSIS — I610 Nontraumatic intracerebral hemorrhage in hemisphere, subcortical: Secondary | ICD-10-CM

## 2017-01-11 DIAGNOSIS — I4892 Unspecified atrial flutter: Secondary | ICD-10-CM

## 2017-01-11 DIAGNOSIS — I69992 Facial weakness following unspecified cerebrovascular disease: Secondary | ICD-10-CM | POA: Diagnosis not present

## 2017-01-11 DIAGNOSIS — N1832 Chronic kidney disease, stage 3b: Secondary | ICD-10-CM | POA: Diagnosis present

## 2017-01-11 DIAGNOSIS — R29715 NIHSS score 15: Secondary | ICD-10-CM | POA: Diagnosis present

## 2017-01-11 DIAGNOSIS — R7303 Prediabetes: Secondary | ICD-10-CM | POA: Diagnosis not present

## 2017-01-11 DIAGNOSIS — Z91041 Radiographic dye allergy status: Secondary | ICD-10-CM | POA: Diagnosis not present

## 2017-01-11 DIAGNOSIS — R1310 Dysphagia, unspecified: Secondary | ICD-10-CM | POA: Diagnosis not present

## 2017-01-11 DIAGNOSIS — Z91128 Patient's intentional underdosing of medication regimen for other reason: Secondary | ICD-10-CM

## 2017-01-11 DIAGNOSIS — E860 Dehydration: Secondary | ICD-10-CM | POA: Diagnosis not present

## 2017-01-11 DIAGNOSIS — R4701 Aphasia: Secondary | ICD-10-CM

## 2017-01-11 DIAGNOSIS — N179 Acute kidney failure, unspecified: Secondary | ICD-10-CM | POA: Diagnosis present

## 2017-01-11 DIAGNOSIS — E890 Postprocedural hypothyroidism: Secondary | ICD-10-CM | POA: Diagnosis not present

## 2017-01-11 DIAGNOSIS — I612 Nontraumatic intracerebral hemorrhage in hemisphere, unspecified: Secondary | ICD-10-CM | POA: Diagnosis not present

## 2017-01-11 DIAGNOSIS — N184 Chronic kidney disease, stage 4 (severe): Secondary | ICD-10-CM | POA: Diagnosis present

## 2017-01-11 DIAGNOSIS — I69320 Aphasia following cerebral infarction: Secondary | ICD-10-CM | POA: Diagnosis not present

## 2017-01-11 DIAGNOSIS — Z801 Family history of malignant neoplasm of trachea, bronchus and lung: Secondary | ICD-10-CM | POA: Diagnosis not present

## 2017-01-11 DIAGNOSIS — S0990XA Unspecified injury of head, initial encounter: Secondary | ICD-10-CM | POA: Diagnosis not present

## 2017-01-11 DIAGNOSIS — Y92009 Unspecified place in unspecified non-institutional (private) residence as the place of occurrence of the external cause: Secondary | ICD-10-CM

## 2017-01-11 LAB — CBC WITH DIFFERENTIAL/PLATELET
BASOS PCT: 0 %
Basophils Absolute: 0 10*3/uL (ref 0.0–0.1)
EOS ABS: 0.2 10*3/uL (ref 0.0–0.7)
Eosinophils Relative: 2 %
HCT: 34.7 % — ABNORMAL LOW (ref 36.0–46.0)
HEMOGLOBIN: 10.9 g/dL — AB (ref 12.0–15.0)
Lymphocytes Relative: 19 %
Lymphs Abs: 2 10*3/uL (ref 0.7–4.0)
MCH: 29.5 pg (ref 26.0–34.0)
MCHC: 31.4 g/dL (ref 30.0–36.0)
MCV: 93.8 fL (ref 78.0–100.0)
MONO ABS: 0.8 10*3/uL (ref 0.1–1.0)
MONOS PCT: 8 %
NEUTROS PCT: 71 %
Neutro Abs: 7.5 10*3/uL (ref 1.7–7.7)
Platelets: 296 10*3/uL (ref 150–400)
RBC: 3.7 MIL/uL — ABNORMAL LOW (ref 3.87–5.11)
RDW: 14.3 % (ref 11.5–15.5)
WBC: 10.5 10*3/uL (ref 4.0–10.5)

## 2017-01-11 LAB — COMPREHENSIVE METABOLIC PANEL
ALBUMIN: 3.1 g/dL — AB (ref 3.5–5.0)
ALT: 44 U/L (ref 14–54)
ANION GAP: 7 (ref 5–15)
AST: 18 U/L (ref 15–41)
Alkaline Phosphatase: 133 U/L — ABNORMAL HIGH (ref 38–126)
BUN: 48 mg/dL — ABNORMAL HIGH (ref 6–20)
CHLORIDE: 104 mmol/L (ref 101–111)
CO2: 28 mmol/L (ref 22–32)
Calcium: 9.2 mg/dL (ref 8.9–10.3)
Creatinine, Ser: 3.02 mg/dL — ABNORMAL HIGH (ref 0.44–1.00)
GFR calc Af Amer: 16 mL/min — ABNORMAL LOW (ref >60)
GFR calc non Af Amer: 14 mL/min — ABNORMAL LOW (ref >60)
GLUCOSE: 127 mg/dL — AB (ref 65–99)
POTASSIUM: 4.8 mmol/L (ref 3.5–5.1)
SODIUM: 139 mmol/L (ref 135–145)
Total Bilirubin: 0.4 mg/dL (ref 0.3–1.2)
Total Protein: 6.3 g/dL — ABNORMAL LOW (ref 6.5–8.1)

## 2017-01-11 LAB — PROTIME-INR
INR: 1.54
Prothrombin Time: 18.6 seconds — ABNORMAL HIGH (ref 11.4–15.2)

## 2017-01-11 MED ORDER — SENNOSIDES-DOCUSATE SODIUM 8.6-50 MG PO TABS: 1.0000 | ORAL_TABLET | Freq: Every evening | ORAL | Status: DC | PRN

## 2017-01-11 MED ORDER — ACETAMINOPHEN 325 MG PO TABS: 650.0000 mg | ORAL_TABLET | ORAL | Status: DC | PRN

## 2017-01-11 MED ORDER — METOPROLOL TARTRATE 25 MG PO TABS: 25.0000 mg | ORAL_TABLET | Freq: Two times a day (BID) | ORAL | Status: DC

## 2017-01-11 MED ORDER — LACTATED RINGERS IV BOLUS (SEPSIS): 250.0000 mL | Freq: Once | INTRAVENOUS | Status: DC

## 2017-01-11 MED ORDER — LACTATED RINGERS IV SOLN: INTRAVENOUS | Status: DC

## 2017-01-11 MED ORDER — ACETAMINOPHEN 650 MG RE SUPP: 650.0000 mg | RECTAL | Status: DC | PRN

## 2017-01-11 MED ORDER — ONDANSETRON HCL 4 MG/2ML IJ SOLN: 4.0000 mg | Freq: Four times a day (QID) | INTRAMUSCULAR | Status: DC | PRN

## 2017-01-11 MED ORDER — APIXABAN 5 MG PO TABS
5.0000 mg | ORAL_TABLET | Freq: Two times a day (BID) | ORAL | Status: DC
  Administered 2017-01-12 – 2017-01-14 (×5): 5 mg via ORAL
  Filled 2017-01-11 (×5): qty 1

## 2017-01-11 MED ORDER — STROKE: EARLY STAGES OF RECOVERY BOOK
Freq: Once | Status: DC
  Filled 2017-01-11: qty 1

## 2017-01-11 MED ORDER — SODIUM CHLORIDE 0.9 % IV SOLN
INTRAVENOUS | Status: DC
  Administered 2017-01-12 – 2017-01-13 (×5): via INTRAVENOUS

## 2017-01-11 MED ORDER — LEVOTHYROXINE SODIUM 88 MCG PO TABS
88.0000 ug | ORAL_TABLET | Freq: Every day | ORAL | Status: DC
  Administered 2017-01-12 – 2017-01-14 (×3): 88 ug via ORAL
  Filled 2017-01-11 (×3): qty 1

## 2017-01-11 MED ORDER — MORPHINE SULFATE ER 15 MG PO TBCR: 15.0000 mg | EXTENDED_RELEASE_TABLET | Freq: Two times a day (BID) | ORAL | Status: DC

## 2017-01-11 MED ORDER — FLUOXETINE HCL 20 MG PO CAPS
20.0000 mg | ORAL_CAPSULE | Freq: Every day | ORAL | Status: DC
  Administered 2017-01-12 – 2017-01-14 (×3): 20 mg via ORAL
  Filled 2017-01-11 (×3): qty 1

## 2017-01-11 MED ORDER — NORTRIPTYLINE HCL 10 MG PO CAPS
20.0000 mg | ORAL_CAPSULE | Freq: Every day | ORAL | Status: DC
  Administered 2017-01-12 – 2017-01-13 (×2): 20 mg via ORAL
  Filled 2017-01-11 (×3): qty 2

## 2017-01-11 MED ORDER — SIMVASTATIN 10 MG PO TABS
10.0000 mg | ORAL_TABLET | Freq: Every day | ORAL | Status: DC
  Administered 2017-01-12 – 2017-01-14 (×3): 10 mg via ORAL
  Filled 2017-01-11 (×3): qty 1

## 2017-01-11 MED ORDER — ACETAMINOPHEN 160 MG/5ML PO SOLN: 650.0000 mg | ORAL | Status: DC | PRN

## 2017-01-11 MED ORDER — METOPROLOL TARTRATE 25 MG PO TABS: 12.5000 mg | ORAL_TABLET | Freq: Two times a day (BID) | ORAL | Status: DC

## 2017-01-11 NOTE — Patient Outreach (Signed)
[facility]) Care Management  01/11/2017  Valerie Mcclure October 22, 1940 [id number]  EMMI-[place] RED ON EMMI ALERT DAY# 3: DATE:01/10/17 RED ALERT:Felling Worse Overall   Outreach attempt #1 to patient. No answer. RN CM left HIPAA compliant message along with contact info.    Plan: RNCM will contact patient within one business day.   Valerie Bells, RN, BSN, MHA/MSL, [place] Telephonic Care Manager Coordinator [facility] Direct Phone: [phone] Toll Free: [phone] Fax: [phone]

## 2017-01-11 NOTE — Telephone Encounter (Signed)
FYI: Spoke with pts daughter, states that EMS was at Pts house and would be transporting her to hospital. States she was worse than when she left the office and from getting her CT Scan done.

## 2017-01-11 NOTE — Assessment & Plan Note (Signed)
Related to recent CVA Worsening of speech since leaving hospital  Stat CT of head

## 2017-01-11 NOTE — ED Provider Notes (Signed)
[facility] DEPT Provider Note   CSN: [id number] Arrival date & time: 01/11/17  1716     History   Chief Complaint Chief Complaint  Patient presents with  . Stroke Symptoms    HPI Valerie Mcclure is a 76 y.o. female.  The history is provided by the spouse, medical records and a relative. No language interpreter was used.  Neurologic Problem  This is a new problem. The current episode started 2 days ago. The problem occurs constantly. The problem has not changed since onset.Nothing aggravates the symptoms. Nothing relieves the symptoms. She has tried rest and food for the symptoms. The treatment provided no relief.    Past Medical History:  Diagnosis Date  . Blood transfusion without reported diagnosis   . Chronic low back pain   . Hyperlipidemia   . Osteopenia   . Unspecified essential hypertension   . Unspecified hypothyroidism     Patient Active Problem List   Diagnosis Date Noted  . Acute kidney failure ([place]) 01/11/2017  . Chronic diastolic heart failure ([place])   . Dysarthria, post-stroke   . Hypoalbuminemia due to protein-calorie malnutrition ([place])   . Global aphasia   . Encephalopathy 12/23/2016  . Acute on chronic diastolic heart failure ([place])   . Acute blood loss anemia   . Tachypnea   . Toxic encephalopathy 12/22/2016  . Atrial flutter ([place]) 12/22/2016  . Hypokalemia 12/21/2016  . Obesity (BMI 30-39.9) 12/21/2016  . Narcotic overdose 12/19/2016  . Spastic hemiplegia of right dominant side as late effect of cerebral infarction ([place])   . E-coli UTI   . Anemia of chronic disease   . Benign essential HTN   . Dysphagia, post-stroke   . Non-fluent aphasia   . Anterior cerebral circulation hemorrhagic infarction ([place]) 12/03/2016  . PAC (premature atrial contraction) 12/03/2016  . Acute ischemic left MCA stroke ([place]) 12/03/2016  . Aphasia as late effect of stroke 12/03/2016  . Right hemiparesis ([place])   . Nontraumatic subcortical hemorrhage of left  cerebral hemisphere ([place])   . Acute embolic stroke ([place]) 94/49/6759  . Mild aortic regurgitation 08/20/2016  . Bilateral edema of lower extremity 04/07/2016  . Pancreatic duct dilated 01/09/2016  . CKD (chronic kidney disease) 12/25/2015  . Mild tricuspid regurgitation 12/25/2015  . Mild mitral regurgitation 12/25/2015  . Prediabetes 06/10/2015  . Edema 06/04/2015  . Abdominal pain, chronic, right upper quadrant 06/04/2015  . Postmenopausal disorder 04/03/2014  . Chronic low back pain   . PAIN IN THORACIC SPINE 04/18/2010  . Coronary atherosclerosis 01/24/2009  . DEGENERATIVE JOINT DISEASE 01/23/2009  . ARTHRITIS, LEFT KNEE 12/13/2008  . HERNIATED LUMBAR DISC 12/13/2008  . Hypothyroidism 11/21/2007  . Hyperlipidemia 11/09/2007  . ANEMIA 11/09/2007  . Essential hypertension 11/09/2007  . Osteopenia 11/09/2007    Past Surgical History:  Procedure Laterality Date  . ABDOMINAL HYSTERECTOMY  1970  . CHOLECYSTECTOMY  07/2009   Dr. Rise Patience  . COLONOSCOPY  2003  . FLEXIBLE SIGMOIDOSCOPY  2010  . HAND SURGERY    . IR ANGIO INTRA EXTRACRAN SEL COM CAROTID INNOMINATE UNI L MOD SED  11/29/2016  . IR ANGIO VERTEBRAL SEL SUBCLAVIAN INNOMINATE UNI R MOD SED  11/29/2016  . IR PERCUTANEOUS ART THROMBECTOMY/INFUSION INTRACRANIAL INC DIAG ANGIO  11/29/2016  . LUMBAR LAMINECTOMY  11/2008   Done by Dr. Patrice Paradise  . RADIOLOGY WITH ANESTHESIA N/A 11/29/2016   Procedure: RADIOLOGY WITH ANESTHESIA;  Surgeon: Radiologist, Medication, MD;  Location: [facility];  Service: Radiology;  Laterality: N/A;  .  THYROIDECTOMY      OB History    No data available       Home Medications    Prior to Admission medications   Medication Sig Start Date End Date Taking? Authorizing Provider  apixaban (ELIQUIS) 5 MG TABS tablet Take 1 tablet (5 mg total) by mouth 2 (two) times daily. 01/06/17  Yes Angiulli, Lavon Paganini, PA-C  Calcium Carbonate-Vit D-Min (CALCIUM 1200 PO) Take 1 tablet by mouth daily.    Yes [provider]  Cholecalciferol (VITAMIN D3) 1000 UNITS CAPS Take 1,000 Units by mouth daily.    Yes [provider]  FLUoxetine (PROZAC) 20 MG capsule Take 1 capsule (20 mg total) by mouth daily. 01/06/17  Yes Angiulli, Lavon Paganini, PA-C  furosemide (LASIX) 20 MG tablet Take 1 tablet (20 mg total) by mouth daily. 01/07/17  Yes Angiulli, Lavon Paganini, PA-C  levothyroxine (SYNTHROID, LEVOTHROID) 88 MCG tablet Take 1 tablet (88 mcg total) by mouth daily. 01/06/17  Yes Angiulli, Lavon Paganini, PA-C  metoprolol tartrate (LOPRESSOR) 25 MG tablet Take 1 tablet (25 mg total) by mouth 2 (two) times daily. 01/06/17  Yes Angiulli, Lavon Paganini, PA-C  morphine (MS CONTIN) 15 MG 12 hr tablet Take 15 mg by mouth every 12 (twelve) hours.   Yes [provider]  nortriptyline (PAMELOR) 10 MG capsule Take 2 capsules (20 mg total) by mouth at bedtime. 01/06/17  Yes Angiulli, Lavon Paganini, PA-C  potassium chloride SA (K-DUR,KLOR-CON) 20 MEQ tablet Take 1 tablet (20 mEq total) by mouth daily. 01/07/17  Yes Angiulli, Lavon Paganini, PA-C  simvastatin (ZOCOR) 10 MG tablet TAKE 1 TABLET(10 MG) BY MOUTH DAILY 01/06/17  Yes Angiulli, Lavon Paganini, PA-C  valsartan (DIOVAN) 160 MG tablet Take 1 tablet (160 mg total) by mouth daily. 01/06/17  Yes Angiulli, Lavon Paganini, PA-C  vitamin B-12 (CYANOCOBALAMIN) 100 MCG tablet Take 100 mcg by mouth daily.   Yes [provider]    Family History Family History  Problem Relation Age of Onset  . Heart disease Father 10       MI age 78s  . Lung cancer Brother 22  . Colon cancer Neg Hx   . Esophageal cancer Neg Hx   . Rectal cancer Neg Hx   . Stomach cancer Neg Hx     Social History Social History  Substance Use Topics  . Smoking status: Never Smoker  . Smokeless tobacco: Never Used  . Alcohol use No     Allergies   Shrimp [shellfish allergy]; Tandem plus [fefum-fepo-fa-b cmp-c-zn-mn-cu]; Ivp dye [iodinated diagnostic agents]; and Penicillins   Review of Systems Review of Systems    Unable to perform ROS: Patient nonverbal     Physical Exam Updated Vital Signs BP (!) 92/44   Pulse 80   Temp 98.3 F (36.8 C) (Oral)   Resp 20   SpO2 93%   Physical Exam  Constitutional: She appears well-developed. No distress.  HENT:  Head: Normocephalic and atraumatic.  Eyes: Conjunctivae are normal.  Neck: Neck supple.  Cardiovascular: Normal rate and regular rhythm.   No murmur heard. Pulmonary/Chest: Effort normal and breath sounds normal. No respiratory distress.  Abdominal: Soft. There is no tenderness.  Musculoskeletal: She exhibits no edema.  Neurological: She is alert.  Expressive aphasia on exam. Nonsensical speech pattern. Slight weakness of RUE and RLE compared with LHB. Mild R facial droop. Moves all four extremities  Skin: Skin is warm and dry.  Nursing note and vitals reviewed.  ED Treatments / Results  Labs (all labs ordered are listed, but only abnormal results are displayed) Labs Reviewed  CBC WITH DIFFERENTIAL/PLATELET - Abnormal; Notable for the following:       Result Value   RBC 3.70 (*)    Hemoglobin 10.9 (*)    HCT 34.7 (*)    All other components within normal limits  COMPREHENSIVE METABOLIC PANEL - Abnormal; Notable for the following:    Glucose, Bld 127 (*)    BUN 48 (*)    Creatinine, Ser 3.02 (*)    Total Protein 6.3 (*)    Albumin 3.1 (*)    Alkaline Phosphatase 133 (*)    GFR calc non Af Amer 14 (*)    GFR calc Af Amer 16 (*)    All other components within normal limits  PROTIME-INR - Abnormal; Notable for the following:    Prothrombin Time 18.6 (*)    All other components within normal limits  URINALYSIS, ROUTINE W REFLEX MICROSCOPIC  RAPID URINE DRUG SCREEN, HOSP PERFORMED  BASIC METABOLIC PANEL    EKG  EKG Interpretation None       Radiology Ct Head Wo Contrast  Result Date: 01/11/2017 CLINICAL DATA:  Recent fall.  12/19/2016 EXAM: CT HEAD WITHOUT CONTRAST TECHNIQUE: Contiguous axial images were obtained from  the base of the skull through the vertex without intravenous contrast. COMPARISON:  12/19/2016 FINDINGS: Brain: Low -density within the left basal ganglia in the area of prior infarct with ray hemorrhagic conversion. Low-density in the anterior left temporal lobe is also in the area of prior infarct. No acute infarction or hemorrhage. No hydrocephalus. No mass effect or midline shift. Vascular: No hyperdense vessel or unexpected calcification. Skull: No acute calvarial abnormality. Sinuses/Orbits: Visualized paranasal sinuses and mastoids clear. Orbital soft tissues unremarkable. Other: None IMPRESSION: Evolutionary changes and the area of hemorrhage in the left basal ganglia and prior infarcts seen in the anterior left temporal lobe. No acute infarction or hemorrhage. Electronically Signed   By: Rolm Baptise M.D.   On: 01/11/2017 14:50    Procedures Procedures (including critical care time)  Medications Ordered in ED Medications  lactated ringers bolus 250 mL (not administered)  lactated ringers infusion (not administered)  simvastatin (ZOCOR) tablet 10 mg (not administered)  nortriptyline (PAMELOR) capsule 20 mg (not administered)  levothyroxine (SYNTHROID, LEVOTHROID) tablet 88 mcg (not administered)  apixaban (ELIQUIS) tablet 5 mg (not administered)  FLUoxetine (PROZAC) capsule 20 mg (not administered)  0.9 %  sodium chloride infusion ( Intravenous New Bag/Given 01/12/17 0113)   stroke: mapping our early stages of recovery book (not administered)  acetaminophen (TYLENOL) tablet 650 mg (not administered)    Or  acetaminophen (TYLENOL) solution 650 mg (not administered)    Or  acetaminophen (TYLENOL) suppository 650 mg (not administered)  senna-docusate (Senokot-S) tablet 1 tablet (not administered)  ondansetron (ZOFRAN) injection 4 mg (not administered)  metoprolol tartrate (LOPRESSOR) tablet 25 mg (not administered)     Initial Impression / Assessment and Plan / ED Course  I have  reviewed the triage vital signs and the nursing notes.  Pertinent labs & imaging results that were available during my care of the patient were reviewed by me and considered in my medical decision making (see chart for details).     76 year old female history of large ischemic left MCA stroke in May 2018 with IR thrombectomy and subsequent hemorrhagic transformation who presents with daughter and pt's husband for AMS. Onset 2 days ago.  Patient just  returned home 3 nights ago from rehab facility after extensive stay.  Daughter and husband state patient was ambulating on her own, feeding herself, and holding normal conversations.  Two days ago, patient's husband and daughter no acute onset of altered mental status with incomprehensible speech, RHB weakness, and inability to stand.  Family waited 2 days until PCP appointment this morning.  CT head was obtained and showed evolutionary changes of hemorrhage in the left basal ganglia and prior infarcts of anterior left temporal lobe.  With persistence of symptoms, EMS was contacted after patient arrived home and she was brought for further evaluation. Patient is currently on Eliquis for A. fib.  Afebrile, VSS.  Patient with nonsensical speech on exam.  Slight weakness noted of RUE and RLE.   Labs significant for Crt 3.01 (baseline 1.1-1.2). UA w/o evidence of UTI. IVF given. Neurology consulted and recommended admission for MRI. Pt admitted for further management and evaluation.  Pt care d/w Dr. Vanita Panda  Final Clinical Impressions(s) / ED Diagnoses   Final diagnoses:  Aphasia as late effect of cerebrovascular accident (CVA)    New Prescriptions New Prescriptions   No medications on file     Payton Emerald, MD 01/13/17 1106    Carmin Muskrat, MD 01/16/17 [phone]

## 2017-01-11 NOTE — ED Triage Notes (Signed)
Pt arrives via [facility] from home, ems reports pt had stroke with right sided defs 6 weeks ago, on Saturday morning family noticed that patient began having issues with her speech and becoming more weak on her right side. Pt saw her doctor today and was sent to [facility] for a head CT. Pt alert, and presents with expressive aphasia.

## 2017-01-11 NOTE — Telephone Encounter (Signed)
Pt's daughter called asking to speak with you. She can be reached at [phone].

## 2017-01-11 NOTE — ED Notes (Signed)
ED Provider at bedside. 

## 2017-01-11 NOTE — Assessment & Plan Note (Signed)
BP controlled Will continue current medication

## 2017-01-11 NOTE — Telephone Encounter (Signed)
Flor, PT from [facility] called to get verbal apporval for POC 3 wk5,  2 wk 3. Approval given.

## 2017-01-11 NOTE — Assessment & Plan Note (Signed)
Has gotten worse since leaving the hospital - did not tolerate baclofen due to sedation Will follow up with neurology

## 2017-01-11 NOTE — Assessment & Plan Note (Signed)
Recent CVA s/p IR and hemorrhagic conversion Increasing difficulty speaking, spasticity in RLE, fatigue Worsening of neuro status -- will get a stat CT scan of head -- if increasing hemorrhage or new infarct will need to go to ED.  If change in neuro status continues will need ED evaluation to rule out infection

## 2017-01-11 NOTE — Assessment & Plan Note (Signed)
She has an irregular rhythm on exam, rate controlled On eliquis without signs of bleeding Management per cardiology

## 2017-01-11 NOTE — H&P (Addendum)
History and Physical    Valerie Mcclure ZDG:[id number] DOB: February 17, 1941 DOA: 01/11/2017  PCP: [name], MD  Patient coming from: Home  I have personally briefly reviewed patient's old medical records in [place]  Chief Complaint: Worsened R sided weakness and new onset aphasia  HPI: Valerie Mcclure is a 76 y.o. female with medical history significant of recent L MCA stroke in May, had mechanical thrombectomy and subsequent hemorrhagic conversion.  Re-admitted for opiate overdose and discharge to SNF.  During second admit was diagnosed with A.Fib and put on eliquis.  Improved in SNF and discharged home on Thursday last week.   ED Course: CT head shows no acute findings.   Review of Systems: As per HPI otherwise 10 point review of systems negative.   Past Medical History:  Diagnosis Date  . Blood transfusion without reported diagnosis   . Chronic low back pain   . Hyperlipidemia   . Osteopenia   . Unspecified essential hypertension   . Unspecified hypothyroidism     Past Surgical History:  Procedure Laterality Date  . ABDOMINAL HYSTERECTOMY  1970  . CHOLECYSTECTOMY  07/2009   Dr. Rise Patience  . COLONOSCOPY  2003  . FLEXIBLE SIGMOIDOSCOPY  2010  . HAND SURGERY    . IR ANGIO INTRA EXTRACRAN SEL COM CAROTID INNOMINATE UNI L MOD SED  11/29/2016  . IR ANGIO VERTEBRAL SEL SUBCLAVIAN INNOMINATE UNI R MOD SED  11/29/2016  . IR PERCUTANEOUS ART THROMBECTOMY/INFUSION INTRACRANIAL INC DIAG ANGIO  11/29/2016  . LUMBAR LAMINECTOMY  11/2008   Done by Dr. Patrice Paradise  . RADIOLOGY WITH ANESTHESIA N/A 11/29/2016   Procedure: RADIOLOGY WITH ANESTHESIA;  Surgeon: Radiologist, Medication, MD;  Location: [facility];  Service: Radiology;  Laterality: N/A;  . THYROIDECTOMY       reports that she has never smoked. She has never used smokeless tobacco. She reports that she does not drink alcohol or use drugs.  Allergies  Allergen Reactions  . Shrimp [Shellfish Allergy] Anaphylaxis and Rash     Break outs and swelling of the throat  . Tandem Plus [Fefum-Fepo-Fa-B Cmp-C-Zn-Mn-Cu] Nausea And Vomiting  . Ivp Dye [Iodinated Diagnostic Agents] Rash    itching  . Penicillins Itching and Rash    Has patient had a PCN reaction causing immediate rash, facial/tongue/throat swelling, SOB or lightheadedness with hypotension: Yes Has patient had a PCN reaction causing severe rash involving mucus membranes or skin necrosis: No Has patient had a PCN reaction that required hospitalization: No Has patient had a PCN reaction occurring within the last 10 years: No If all of the above answers are "NO", then may proceed with Cephalosporin use.     Family History  Problem Relation Age of Onset  . Heart disease Father 25       MI age 37s  . Lung cancer Brother 72  . Colon cancer Neg Hx   . Esophageal cancer Neg Hx   . Rectal cancer Neg Hx   . Stomach cancer Neg Hx      Prior to Admission medications   Medication Sig Start Date End Date Taking? Authorizing Provider  apixaban (ELIQUIS) 5 MG TABS tablet Take 1 tablet (5 mg total) by mouth 2 (two) times daily. 01/06/17  Yes Angiulli, Lavon Paganini, PA-C  Calcium Carbonate-Vit D-Min (CALCIUM 1200 PO) Take 1 tablet by mouth daily.    Yes [provider]  Cholecalciferol (VITAMIN D3) 1000 UNITS CAPS Take 1,000 Units by mouth daily.    Yes  [provider]  FLUoxetine (PROZAC) 20 MG capsule Take 1 capsule (20 mg total) by mouth daily. 01/06/17  Yes Angiulli, Lavon Paganini, PA-C  furosemide (LASIX) 20 MG tablet Take 1 tablet (20 mg total) by mouth daily. 01/07/17  Yes Angiulli, Lavon Paganini, PA-C  levothyroxine (SYNTHROID, LEVOTHROID) 88 MCG tablet Take 1 tablet (88 mcg total) by mouth daily. 01/06/17  Yes Angiulli, Lavon Paganini, PA-C  metoprolol tartrate (LOPRESSOR) 25 MG tablet Take 1 tablet (25 mg total) by mouth 2 (two) times daily. 01/06/17  Yes Angiulli, Lavon Paganini, PA-C  morphine (MS CONTIN) 15 MG 12 hr tablet Take 15 mg by mouth every 12 (twelve)  hours.   Yes [provider]  nortriptyline (PAMELOR) 10 MG capsule Take 2 capsules (20 mg total) by mouth at bedtime. 01/06/17  Yes Angiulli, Lavon Paganini, PA-C  potassium chloride SA (K-DUR,KLOR-CON) 20 MEQ tablet Take 1 tablet (20 mEq total) by mouth daily. 01/07/17  Yes Angiulli, Lavon Paganini, PA-C  simvastatin (ZOCOR) 10 MG tablet TAKE 1 TABLET(10 MG) BY MOUTH DAILY 01/06/17  Yes Angiulli, Lavon Paganini, PA-C  valsartan (DIOVAN) 160 MG tablet Take 1 tablet (160 mg total) by mouth daily. 01/06/17  Yes Angiulli, Lavon Paganini, PA-C  vitamin B-12 (CYANOCOBALAMIN) 100 MCG tablet Take 100 mcg by mouth daily.   Yes [provider]    Physical Exam: Vitals:   01/11/17 1902 01/11/17 2025 01/11/17 2115 01/11/17 2215  BP:  99/77 (!) 105/46 (!) 118/57  Pulse: 77 62  97  Resp: 19 17 14 19   Temp:      TempSrc:      SpO2: 100% 96%  (!) 75%    Constitutional: NAD, calm, comfortable Eyes: PERRL, lids and conjunctivae normal ENMT: Mucous membranes are moist. Posterior pharynx clear of any exudate or lesions.Normal dentition.  Neck: normal, supple, no masses, no thyromegaly Respiratory: clear to auscultation bilaterally, no wheezing, no crackles. Normal respiratory effort. No accessory muscle use.  Cardiovascular: Regular rate and rhythm, no murmurs / rubs / gallops. No extremity edema. 2+ pedal pulses. No carotid bruits.  Abdomen: no tenderness, no masses palpated. No hepatosplenomegaly. Bowel sounds positive.  Musculoskeletal: no clubbing / cyanosis. No joint deformity upper and lower extremities. Good ROM, no contractures. Normal muscle tone.  Skin: no rashes, lesions, ulcers. No induration Neurologic: No blink to threat in R visual field, RUE 4/5, tongue deviates to right, R facial droop, dysarthria present, severe naming deficit. Psychiatric: Normal judgment and insight. Alert and oriented x 3. Normal mood.    Labs on Admission: I have personally reviewed following labs and imaging  studies  CBC:  Recent Labs Lab 01/05/17 0500 01/11/17 1841  WBC 9.9 10.5  NEUTROABS 7.3 7.5  HGB 11.5* 10.9*  HCT 36.3 34.7*  MCV 91.9 93.8  PLT 272 161   Basic Metabolic Panel:  Recent Labs Lab 01/05/17 0500 01/06/17 0529 01/11/17 1841  NA 141 141 139  K 3.6 3.8 4.8  CL 106 104 104  CO2 26 28 28   GLUCOSE 119* 121* 127*  BUN 16 16 48*  CREATININE 1.17* 1.23* 3.02*  CALCIUM 8.8* 9.0 9.2   GFR: Estimated Creatinine Clearance: 14.6 mL/min (A) (by C-G formula based on SCr of 3.02 mg/dL (H)). Liver Function Tests:  Recent Labs Lab 01/11/17 1841  AST 18  ALT 44  ALKPHOS 133*  BILITOT 0.4  PROT 6.3*  ALBUMIN 3.1*   No results for input(s): LIPASE, AMYLASE in the last 168 hours. No results for input(s): AMMONIA  in the last 168 hours. Coagulation Profile:  Recent Labs Lab 01/11/17 1841  INR 1.54   Cardiac Enzymes: No results for input(s): CKTOTAL, CKMB, CKMBINDEX, TROPONINI in the last 168 hours. BNP (last 3 results) No results for input(s): PROBNP in the last 8760 hours. HbA1C: No results for input(s): HGBA1C in the last 72 hours. CBG:  Recent Labs Lab 01/05/17 0629  GLUCAP 112*   Lipid Profile: No results for input(s): CHOL, HDL, LDLCALC, TRIG, CHOLHDL, LDLDIRECT in the last 72 hours. Thyroid Function Tests: No results for input(s): TSH, T4TOTAL, FREET4, T3FREE, THYROIDAB in the last 72 hours. Anemia Panel: No results for input(s): VITAMINB12, FOLATE, FERRITIN, TIBC, IRON, RETICCTPCT in the last 72 hours. Urine analysis:    Component Value Date/Time   COLORURINE YELLOW 12/23/2016 1020   APPEARANCEUR HAZY (A) 12/23/2016 1020   LABSPEC 1.008 12/23/2016 1020   PHURINE 7.0 12/23/2016 1020   GLUCOSEU NEGATIVE 12/23/2016 1020   GLUCOSEU NEGATIVE 11/18/2010 0859   HGBUR NEGATIVE 12/23/2016 1020   HGBUR negative 12/13/2008 0944   BILIRUBINUR NEGATIVE 12/23/2016 1020   KETONESUR NEGATIVE 12/23/2016 1020   PROTEINUR NEGATIVE 12/23/2016 1020    UROBILINOGEN 0.2 11/18/2010 0859   NITRITE POSITIVE (A) 12/23/2016 1020   LEUKOCYTESUR TRACE (A) 12/23/2016 1020    Radiological Exams on Admission: Ct Head Wo Contrast  Result Date: 01/11/2017 CLINICAL DATA:  Recent fall.  12/19/2016 EXAM: CT HEAD WITHOUT CONTRAST TECHNIQUE: Contiguous axial images were obtained from the base of the skull through the vertex without intravenous contrast. COMPARISON:  12/19/2016 FINDINGS: Brain: Low -density within the left basal ganglia in the area of prior infarct with ray hemorrhagic conversion. Low-density in the anterior left temporal lobe is also in the area of prior infarct. No acute infarction or hemorrhage. No hydrocephalus. No mass effect or midline shift. Vascular: No hyperdense vessel or unexpected calcification. Skull: No acute calvarial abnormality. Sinuses/Orbits: Visualized paranasal sinuses and mastoids clear. Orbital soft tissues unremarkable. Other: None IMPRESSION: Evolutionary changes and the area of hemorrhage in the left basal ganglia and prior infarcts seen in the anterior left temporal lobe. No acute infarction or hemorrhage. Electronically Signed   By: Rolm Baptise M.D.   On: 01/11/2017 14:50    EKG: Independently reviewed.  Assessment/Plan Principal Problem:   Aphasia as late effect of stroke Active Problems:   CKD (chronic kidney disease)   Benign essential HTN   Atrial flutter ([name])   Acute kidney failure ([name])    1. Aphasia / dysarthria as late effect of recent MCA stroke - 1. MRI/MRA ordered 2. UDS ordered 3. Holding MS contin for now, possible OD or accidental OD due to worsening kidney function. 4. PT/OT/SLP 2. AKF on CKD stage 3 - 1. Holding BP meds given low BPs in ED 2. Bladder scan 3. Strict intake and output 4. IVF 5. Repeat BMP in AM 6. UA pending 3. A.Flutter - 1. continue eliquis 2. Continue metoprolol 4. HTN - 1. holding lasix and losartan 2. Continue metoprolol for rate control of A.Flutter  DVT  prophylaxis: Eliquis Code Status: Full Family Communication: Family at bedside Disposition Plan: TBD Consults called: Neuro Admission status: Place in obs - convert to inpatient if MRI shows acute ischemic stroke extension   Etta Quill. DO [facility] Pager [phone]  If 7AM-7PM, please contact day team taking care of patient [email or web address] Password [facility]  01/11/2017, 11:42 PM

## 2017-01-11 NOTE — Assessment & Plan Note (Signed)
Lab Results  Component Value Date   HGBA1C 5.8 (H) 11/30/2016   Will monitor

## 2017-01-11 NOTE — ED Notes (Signed)
Family provided mouth swabs and ice

## 2017-01-11 NOTE — Telephone Encounter (Signed)
Agree with ED evaluation - concern for possible infection
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
## 2017-01-11 NOTE — Assessment & Plan Note (Signed)
Secondary to recent CVA with spasticity - did not tolerate baclofen due to sedation Will schedule neurology f/u
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
## 2017-01-11 NOTE — Telephone Encounter (Signed)
Pt's daughter called back stating that she is going to call 911 and have her mom taken to the hospital.

## 2017-01-12 ENCOUNTER — Ambulatory Visit: Payer: Self-pay | Admitting: *Deleted

## 2017-01-12 ENCOUNTER — Observation Stay (HOSPITAL_COMMUNITY): Payer: Medicare Other

## 2017-01-12 ENCOUNTER — Other Ambulatory Visit: Payer: Self-pay | Admitting: *Deleted

## 2017-01-12 DIAGNOSIS — G8191 Hemiplegia, unspecified affecting right dominant side: Secondary | ICD-10-CM | POA: Diagnosis present

## 2017-01-12 DIAGNOSIS — Z79899 Other long term (current) drug therapy: Secondary | ICD-10-CM | POA: Diagnosis not present

## 2017-01-12 DIAGNOSIS — R1310 Dysphagia, unspecified: Secondary | ICD-10-CM | POA: Diagnosis not present

## 2017-01-12 DIAGNOSIS — E860 Dehydration: Secondary | ICD-10-CM | POA: Diagnosis present

## 2017-01-12 DIAGNOSIS — I69351 Hemiplegia and hemiparesis following cerebral infarction affecting right dominant side: Secondary | ICD-10-CM | POA: Diagnosis not present

## 2017-01-12 DIAGNOSIS — Y92009 Unspecified place in unspecified non-institutional (private) residence as the place of occurrence of the external cause: Secondary | ICD-10-CM | POA: Diagnosis not present

## 2017-01-12 DIAGNOSIS — I129 Hypertensive chronic kidney disease with stage 1 through stage 4 chronic kidney disease, or unspecified chronic kidney disease: Secondary | ICD-10-CM | POA: Diagnosis not present

## 2017-01-12 DIAGNOSIS — M545 Low back pain: Secondary | ICD-10-CM | POA: Diagnosis not present

## 2017-01-12 DIAGNOSIS — R5381 Other malaise: Secondary | ICD-10-CM | POA: Diagnosis not present

## 2017-01-12 DIAGNOSIS — N179 Acute kidney failure, unspecified: Secondary | ICD-10-CM | POA: Diagnosis not present

## 2017-01-12 DIAGNOSIS — Z91128 Patient's intentional underdosing of medication regimen for other reason: Secondary | ICD-10-CM | POA: Diagnosis not present

## 2017-01-12 DIAGNOSIS — I69391 Dysphagia following cerebral infarction: Secondary | ICD-10-CM | POA: Diagnosis not present

## 2017-01-12 DIAGNOSIS — I459 Conduction disorder, unspecified: Secondary | ICD-10-CM | POA: Diagnosis present

## 2017-01-12 DIAGNOSIS — I69393 Ataxia following cerebral infarction: Secondary | ICD-10-CM | POA: Diagnosis not present

## 2017-01-12 DIAGNOSIS — E785 Hyperlipidemia, unspecified: Secondary | ICD-10-CM | POA: Diagnosis not present

## 2017-01-12 DIAGNOSIS — Z91013 Allergy to seafood: Secondary | ICD-10-CM | POA: Diagnosis not present

## 2017-01-12 DIAGNOSIS — Z9049 Acquired absence of other specified parts of digestive tract: Secondary | ICD-10-CM | POA: Diagnosis not present

## 2017-01-12 DIAGNOSIS — N183 Chronic kidney disease, stage 3 (moderate): Secondary | ICD-10-CM | POA: Diagnosis not present

## 2017-01-12 DIAGNOSIS — Z880 Allergy status to penicillin: Secondary | ICD-10-CM | POA: Diagnosis not present

## 2017-01-12 DIAGNOSIS — E876 Hypokalemia: Secondary | ICD-10-CM | POA: Diagnosis not present

## 2017-01-12 DIAGNOSIS — D62 Acute posthemorrhagic anemia: Secondary | ICD-10-CM | POA: Diagnosis not present

## 2017-01-12 DIAGNOSIS — I69392 Facial weakness following cerebral infarction: Secondary | ICD-10-CM | POA: Diagnosis not present

## 2017-01-12 DIAGNOSIS — T45516A Underdosing of anticoagulants, initial encounter: Secondary | ICD-10-CM | POA: Diagnosis present

## 2017-01-12 DIAGNOSIS — Z888 Allergy status to other drugs, medicaments and biological substances status: Secondary | ICD-10-CM | POA: Diagnosis not present

## 2017-01-12 DIAGNOSIS — Z8249 Family history of ischemic heart disease and other diseases of the circulatory system: Secondary | ICD-10-CM | POA: Diagnosis not present

## 2017-01-12 DIAGNOSIS — M8580 Other specified disorders of bone density and structure, unspecified site: Secondary | ICD-10-CM | POA: Diagnosis present

## 2017-01-12 DIAGNOSIS — E8809 Other disorders of plasma-protein metabolism, not elsewhere classified: Secondary | ICD-10-CM | POA: Diagnosis not present

## 2017-01-12 DIAGNOSIS — E890 Postprocedural hypothyroidism: Secondary | ICD-10-CM | POA: Diagnosis not present

## 2017-01-12 DIAGNOSIS — I63512 Cerebral infarction due to unspecified occlusion or stenosis of left middle cerebral artery: Secondary | ICD-10-CM | POA: Diagnosis present

## 2017-01-12 DIAGNOSIS — D638 Anemia in other chronic diseases classified elsewhere: Secondary | ICD-10-CM | POA: Diagnosis present

## 2017-01-12 DIAGNOSIS — R4701 Aphasia: Secondary | ICD-10-CM | POA: Diagnosis not present

## 2017-01-12 DIAGNOSIS — I480 Paroxysmal atrial fibrillation: Secondary | ICD-10-CM | POA: Diagnosis present

## 2017-01-12 DIAGNOSIS — R739 Hyperglycemia, unspecified: Secondary | ICD-10-CM | POA: Diagnosis not present

## 2017-01-12 DIAGNOSIS — I10 Essential (primary) hypertension: Secondary | ICD-10-CM | POA: Diagnosis not present

## 2017-01-12 DIAGNOSIS — R2689 Other abnormalities of gait and mobility: Secondary | ICD-10-CM | POA: Diagnosis not present

## 2017-01-12 DIAGNOSIS — Z801 Family history of malignant neoplasm of trachea, bronchus and lung: Secondary | ICD-10-CM | POA: Diagnosis not present

## 2017-01-12 DIAGNOSIS — I69320 Aphasia following cerebral infarction: Secondary | ICD-10-CM | POA: Diagnosis not present

## 2017-01-12 DIAGNOSIS — I69390 Apraxia following cerebral infarction: Secondary | ICD-10-CM | POA: Diagnosis not present

## 2017-01-12 DIAGNOSIS — R29715 NIHSS score 15: Secondary | ICD-10-CM | POA: Diagnosis present

## 2017-01-12 DIAGNOSIS — Z91041 Radiographic dye allergy status: Secondary | ICD-10-CM | POA: Diagnosis not present

## 2017-01-12 DIAGNOSIS — G8929 Other chronic pain: Secondary | ICD-10-CM | POA: Diagnosis not present

## 2017-01-12 DIAGNOSIS — I4892 Unspecified atrial flutter: Secondary | ICD-10-CM | POA: Diagnosis not present

## 2017-01-12 LAB — URINALYSIS, ROUTINE W REFLEX MICROSCOPIC
BILIRUBIN URINE: NEGATIVE
GLUCOSE, UA: NEGATIVE mg/dL
HGB URINE DIPSTICK: NEGATIVE
Ketones, ur: NEGATIVE mg/dL
Leukocytes, UA: NEGATIVE
Nitrite: NEGATIVE
Protein, ur: NEGATIVE mg/dL
SPECIFIC GRAVITY, URINE: 1.01 (ref 1.005–1.030)
pH: 5 (ref 5.0–8.0)

## 2017-01-12 LAB — BASIC METABOLIC PANEL
ANION GAP: 10 (ref 5–15)
BUN: 40 mg/dL — ABNORMAL HIGH (ref 6–20)
CALCIUM: 9 mg/dL (ref 8.9–10.3)
CO2: 24 mmol/L (ref 22–32)
CREATININE: 2.46 mg/dL — AB (ref 0.44–1.00)
Chloride: 105 mmol/L (ref 101–111)
GFR, EST AFRICAN AMERICAN: 21 mL/min — AB (ref >60)
GFR, EST NON AFRICAN AMERICAN: 18 mL/min — AB (ref >60)
Glucose, Bld: 129 mg/dL — ABNORMAL HIGH (ref 65–99)
Potassium: 4.1 mmol/L (ref 3.5–5.1)
Sodium: 139 mmol/L (ref 135–145)

## 2017-01-12 LAB — RAPID URINE DRUG SCREEN, HOSP PERFORMED
Amphetamines: NOT DETECTED
BARBITURATES: NOT DETECTED
BENZODIAZEPINES: NOT DETECTED
Cocaine: NOT DETECTED
Opiates: POSITIVE — AB
Tetrahydrocannabinol: NOT DETECTED

## 2017-01-12 MED ORDER — ORAL CARE MOUTH RINSE
15.0000 mL | Freq: Two times a day (BID) | OROMUCOSAL | Status: DC
  Administered 2017-01-12 – 2017-01-14 (×3): 15 mL via OROMUCOSAL

## 2017-01-12 MED ORDER — CHLORHEXIDINE GLUCONATE 0.12 % MT SOLN
15.0000 mL | Freq: Two times a day (BID) | OROMUCOSAL | Status: DC
  Administered 2017-01-12 – 2017-01-14 (×5): 15 mL via OROMUCOSAL
  Filled 2017-01-12 (×4): qty 15

## 2017-01-12 MED ORDER — RESOURCE THICKENUP CLEAR PO POWD
ORAL | Status: DC | PRN
  Filled 2017-01-12 (×2): qty 125

## 2017-01-12 MED ORDER — METOPROLOL TARTRATE 25 MG PO TABS
25.0000 mg | ORAL_TABLET | Freq: Two times a day (BID) | ORAL | Status: DC
  Administered 2017-01-12 – 2017-01-14 (×5): 25 mg via ORAL
  Filled 2017-01-12 (×5): qty 1
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
## 2017-01-12 NOTE — Progress Notes (Signed)
PROGRESS NOTE    Valerie Mcclure  OVZ:[id number] DOB: 1940-07-24 DOA: 01/11/2017 PCP: [name], MD    Brief Narrative:  Patient is a 76 year old female with history of CKD, hyperlipidemia, recent stroke L MCA in May, had mechanical thrombectomy and subsequent hemorrhagic conversion and recent diagnosis of A. fib on eliquis. Patient presented for worsening right-sided weakness and new onset aphasia.  Neurology consulted and currently working patient up   Assessment & Plan: Stroke - Q versus early subacute infarction left caudate body extending into the periventricular white matter and within the left medial thalamus likely extension and recent stroke from large vessel disease status post left MCA mechanical temp thrombectomy in May 2018. Of note recent intracerebral hemorrhage following mechanical embolectomy has fortunately remained stable despite patient being started on eliquis. - Echocardiogram obtained and reporting EF of 60-65% with no source of embolus - Vas [place] BLE: no DVT - Neurology on board and making further evaluation recommendations - LDL 85, goal < 70  - Speech therapy evaluation - PT recommending CIR evaluation. Consult placed.  Hyperlipidemia - LDL 85, goal < 70. Continue statin  Principal Problem:   Aphasia as late effect of stroke Active Problems:   Acute on chronic CKD (chronic kidney disease) - Recent serum creatinines have been as low as 1.1-1.2. Serum creatinine trending down from 3-2.4. - Continue normal saline hydration and reassess BMP next a.m.    Benign essential HTN -Allow for permissive hypertension but continuing beta blocker for atrial flutter controlled    Atrial flutter ([name]) - Continue beta blocker for control and prevention of subsequent strokes - Eliquis   DVT prophylaxis: Pt on Eliquis Code Status: Full Family Communication: tried to update spouse but obtained VM, called daughter too but got VM Disposition Plan: Pending further  evaluation by neurology place consult to CIR,    Consultants:   Neurology   Procedures: Stroke work up   Antimicrobials: None   Subjective: The patient has no new complaints.   Objective: Vitals:   01/12/17 0525 01/12/17 1249 01/12/17 1421 01/12/17 1501  BP: (!) 98/48 (!) 110/46 (!) 83/51 (!) 108/56  Pulse:  79 78   Resp: 18  16   Temp: 98.8 F (37.1 C)  98.4 F (36.9 C)   TempSrc:   Oral   SpO2:   94%     Intake/Output Summary (Last 24 hours) at 01/12/17 2021 Last data filed at 01/12/17 1500  Gross per 24 hour  Intake          1196.25 ml  Output              900 ml  Net           296.25 ml   There were no vitals filed for this visit.  Examination:  General exam: Appears calm and comfortable , In no acute distress Respiratory system: Clear to auscultation. Respiratory effort normal. Cardiovascular system: S1 & S2 heard, no murmurs or gallops CV strip: reporting sinus with PVC's Gastrointestinal system: Abdomen is nondistended, soft and nontender. No organomegaly or masses felt. Normal bowel sounds heard. Central nervous system: Alert and awake, aphasia Extremities: Symmetric 5 x 5 power. Skin: No rashes, lesions or ulcers Psychiatry: Mood & affect appropriate.     Data Reviewed: I have personally reviewed following labs and imaging studies  CBC:  Recent Labs Lab 01/11/17 1841  WBC 10.5  NEUTROABS 7.5  HGB 10.9*  HCT 34.7*  MCV 93.8  PLT 296  Basic Metabolic Panel:  Recent Labs Lab 01/06/17 0529 01/11/17 1841 01/12/17 0705  NA 141 139 139  K 3.8 4.8 4.1  CL 104 104 105  CO2 28 28 24   GLUCOSE 121* 127* 129*  BUN 16 48* 40*  CREATININE 1.23* 3.02* 2.46*  CALCIUM 9.0 9.2 9.0   GFR: Estimated Creatinine Clearance: 17.9 mL/min (A) (by C-G formula based on SCr of 2.46 mg/dL (H)). Liver Function Tests:  Recent Labs Lab 01/11/17 1841  AST 18  ALT 44  ALKPHOS 133*  BILITOT 0.4  PROT 6.3*  ALBUMIN 3.1*   No results for input(s):  LIPASE, AMYLASE in the last 168 hours. No results for input(s): AMMONIA in the last 168 hours. Coagulation Profile:  Recent Labs Lab 01/11/17 1841  INR 1.54   Cardiac Enzymes: No results for input(s): CKTOTAL, CKMB, CKMBINDEX, TROPONINI in the last 168 hours. BNP (last 3 results) No results for input(s): PROBNP in the last 8760 hours. HbA1C: No results for input(s): HGBA1C in the last 72 hours. CBG: No results for input(s): GLUCAP in the last 168 hours. Lipid Profile: No results for input(s): CHOL, HDL, LDLCALC, TRIG, CHOLHDL, LDLDIRECT in the last 72 hours. Thyroid Function Tests: No results for input(s): TSH, T4TOTAL, FREET4, T3FREE, THYROIDAB in the last 72 hours. Anemia Panel: No results for input(s): VITAMINB12, FOLATE, FERRITIN, TIBC, IRON, RETICCTPCT in the last 72 hours. Sepsis Labs: No results for input(s): PROCALCITON, LATICACIDVEN in the last 168 hours.  No results found for this or any previous visit (from the past 240 hour(s)).    Radiology Studies: Ct Head Wo Contrast  Result Date: 01/11/2017 CLINICAL DATA:  Recent fall.  12/19/2016 EXAM: CT HEAD WITHOUT CONTRAST TECHNIQUE: Contiguous axial images were obtained from the base of the skull through the vertex without intravenous contrast. COMPARISON:  12/19/2016 FINDINGS: Brain: Low -density within the left basal ganglia in the area of prior infarct with ray hemorrhagic conversion. Low-density in the anterior left temporal lobe is also in the area of prior infarct. No acute infarction or hemorrhage. No hydrocephalus. No mass effect or midline shift. Vascular: No hyperdense vessel or unexpected calcification. Skull: No acute calvarial abnormality. Sinuses/Orbits: Visualized paranasal sinuses and mastoids clear. Orbital soft tissues unremarkable. Other: None IMPRESSION: Evolutionary changes and the area of hemorrhage in the left basal ganglia and prior infarcts seen in the anterior left temporal lobe. No acute infarction or  hemorrhage. Electronically Signed   By: Rolm Baptise M.D.   On: 01/11/2017 14:50   Mr Brain Wo Contrast  Result Date: 01/12/2017 CLINICAL DATA:  76 y/o F; worsening right upper and lower extremity weakness and new onset aphasia Saturday. Weakness has resolved with residual aphasia. EXAM: MRI HEAD WITHOUT CONTRAST MRA HEAD WITHOUT CONTRAST TECHNIQUE: Multiplanar, multiecho pulse sequences of the brain and surrounding structures were obtained without intravenous contrast. Angiographic images of the head were obtained using MRA technique without contrast. COMPARISON:  01/11/2017 CT head.  11/30/2016 MRI of the brain. FINDINGS: MRI HEAD FINDINGS Brain: There is mildly reduced diffusion within the left caudate body extending in the periventricular white matter and within the left medial thalamus with increased distribution in comparison with areas of infection seen on the prior MRI of the brain. There is mild associated T2 FLAIR hyperintense signal abnormality. Findings are compatible with interval acute to early subacute infarction. Hematoma within the left basal ganglia is decreased in size from the prior MRI of the brain and demonstrates increased T1/T2 signal compatible with late subacute hemorrhage. T2  FLAIR hyperintense signal abnormality with pons likely representing microvascular ischemic changes. Left anterior temporal encephalomalacia corresponding to region of infarction on the prior MRI of the brain. The area of infarction demonstrates hemosiderin staining. Additionally, there is hemosiderin staining along the margins of the left lateral ventricle frontal horn. No hydrocephalus, herniation, or evidence for interval hemorrhage. Vascular: As below. Skull and upper cervical spine: Normal marrow signal. Sinuses/Orbits: Negative. Other: None. MRA HEAD FINDINGS Internal carotid arteries:  Patent. Anterior cerebral arteries:  Patent. Middle cerebral arteries: Patent. Anterior communicating artery: Patent.  Posterior communicating arteries: Fetal left PCA. No right posterior communicating artery identified, likely hypoplastic or absent. Posterior cerebral arteries:  Patent. Basilar artery:  Patent. Vertebral arteries:  Patent. No evidence of high-grade stenosis, large vessel occlusion, or aneurysm unless noted above. IMPRESSION: 1. Reduced diffusion within left caudate body extending in the periventricular white matter and within the left medial thalamus increased in distribution in comparison with prior MRI of the brain. Findings are consistent with interval acute/early subacute infarction. 2. Decreased size of late subacute hematoma within the left basal ganglia. Left lateral ventricle hemosiderin staining. 3. Left anterior temporal lobe encephalomalacia from prior infarction. 4. Patent circle of Willis. No large vessel occlusion, aneurysm, or significant stenosis is identified. Electronically Signed   By: Kristine Garbe M.D.   On: 01/12/2017 02:50   Mr Jodene Nam Head/brain YK Cm  Result Date: 01/12/2017 CLINICAL DATA:  76 y/o F; worsening right upper and lower extremity weakness and new onset aphasia Saturday. Weakness has resolved with residual aphasia. EXAM: MRI HEAD WITHOUT CONTRAST MRA HEAD WITHOUT CONTRAST TECHNIQUE: Multiplanar, multiecho pulse sequences of the brain and surrounding structures were obtained without intravenous contrast. Angiographic images of the head were obtained using MRA technique without contrast. COMPARISON:  01/11/2017 CT head.  11/30/2016 MRI of the brain. FINDINGS: MRI HEAD FINDINGS Brain: There is mildly reduced diffusion within the left caudate body extending in the periventricular white matter and within the left medial thalamus with increased distribution in comparison with areas of infection seen on the prior MRI of the brain. There is mild associated T2 FLAIR hyperintense signal abnormality. Findings are compatible with interval acute to early subacute infarction.  Hematoma within the left basal ganglia is decreased in size from the prior MRI of the brain and demonstrates increased T1/T2 signal compatible with late subacute hemorrhage. T2 FLAIR hyperintense signal abnormality with pons likely representing microvascular ischemic changes. Left anterior temporal encephalomalacia corresponding to region of infarction on the prior MRI of the brain. The area of infarction demonstrates hemosiderin staining. Additionally, there is hemosiderin staining along the margins of the left lateral ventricle frontal horn. No hydrocephalus, herniation, or evidence for interval hemorrhage. Vascular: As below. Skull and upper cervical spine: Normal marrow signal. Sinuses/Orbits: Negative. Other: None. MRA HEAD FINDINGS Internal carotid arteries:  Patent. Anterior cerebral arteries:  Patent. Middle cerebral arteries: Patent. Anterior communicating artery: Patent. Posterior communicating arteries: Fetal left PCA. No right posterior communicating artery identified, likely hypoplastic or absent. Posterior cerebral arteries:  Patent. Basilar artery:  Patent. Vertebral arteries:  Patent. No evidence of high-grade stenosis, large vessel occlusion, or aneurysm unless noted above. IMPRESSION: 1. Reduced diffusion within left caudate body extending in the periventricular white matter and within the left medial thalamus increased in distribution in comparison with prior MRI of the brain. Findings are consistent with interval acute/early subacute infarction. 2. Decreased size of late subacute hematoma within the left basal ganglia. Left lateral ventricle hemosiderin staining. 3. Left anterior temporal  lobe encephalomalacia from prior infarction. 4. Patent circle of Willis. No large vessel occlusion, aneurysm, or significant stenosis is identified. Electronically Signed   By: Kristine Garbe M.D.   On: 01/12/2017 02:50    Scheduled Meds: .  stroke: mapping our early stages of recovery book   Does  not apply Once  . apixaban  5 mg Oral BID  . chlorhexidine  15 mL Mouth Rinse BID  . FLUoxetine  20 mg Oral Daily  . levothyroxine  88 mcg Oral QAC breakfast  . mouth rinse  15 mL Mouth Rinse q12n4p  . metoprolol tartrate  25 mg Oral BID  . nortriptyline  20 mg Oral QHS  . simvastatin  10 mg Oral q1800   Continuous Infusions: . sodium chloride 75 mL/hr at 01/12/17 1816  . lactated ringers    . lactated ringers       LOS: 0 days    Time spent: > 35 minutes  Velvet Bathe, MD [facility] Pager [phone]  If 7PM-7AM, please contact night-coverage [email or web address] Password [email or web address] 01/12/2017, 8:21 PM

## 2017-01-12 NOTE — Progress Notes (Signed)
Rehab Admissions Coordinator Note:  Patient was screened by Cleatrice Burke for appropriateness for an Inpatient Acute Rehab Consult per OT recommendation.   At this time, we are recommending Inpatient Rehab consult. Patient was discharged from [facility] re[facility] on 01/07/17 home with family, not SNF as per H and P.   Cleatrice Burke 01/12/2017, 10:56 AM  I can be reached at [phone].

## 2017-01-12 NOTE — Evaluation (Signed)
Speech Language Pathology Evaluation Patient Details Name: Valerie Mcclure MRN: [id number] DOB: [birth year] Today's Date: 01/12/2017 Time: 5916-3846 SLP Time Calculation (min) (ACUTE ONLY): 16 min  Problem List:  Patient Active Problem List   Diagnosis Date Noted  . Acute kidney failure ([place]) 01/11/2017  . Chronic diastolic heart failure ([place])   . Dysarthria, post-stroke   . Hypoalbuminemia due to protein-calorie malnutrition ([place])   . Global aphasia   . Encephalopathy 12/23/2016  . Acute on chronic diastolic heart failure ([place])   . Acute blood loss anemia   . Tachypnea   . Toxic encephalopathy 12/22/2016  . Atrial flutter ([place]) 12/22/2016  . Hypokalemia 12/21/2016  . Obesity (BMI 30-39.9) 12/21/2016  . Narcotic overdose 12/19/2016  . Spastic hemiplegia of right dominant side as late effect of cerebral infarction ([place])   . E-coli UTI   . Anemia of chronic disease   . Benign essential HTN   . Dysphagia, post-stroke   . Non-fluent aphasia   . Anterior cerebral circulation hemorrhagic infarction ([place]) 12/03/2016  . PAC (premature atrial contraction) 12/03/2016  . Acute ischemic left MCA stroke ([place]) 12/03/2016  . Aphasia as late effect of stroke 12/03/2016  . Right hemiparesis ([place])   . Nontraumatic subcortical hemorrhage of left cerebral hemisphere ([place])   . Acute embolic stroke ([place]) 65/99/3570  . Mild aortic regurgitation 08/20/2016  . Bilateral edema of lower extremity 04/07/2016  . Pancreatic duct dilated 01/09/2016  . CKD (chronic kidney disease) 12/25/2015  . Mild tricuspid regurgitation 12/25/2015  . Mild mitral regurgitation 12/25/2015  . Prediabetes 06/10/2015  . Edema 06/04/2015  . Abdominal pain, chronic, right upper quadrant 06/04/2015  . Postmenopausal disorder 04/03/2014  . Chronic low back pain   . PAIN IN THORACIC SPINE 04/18/2010  . Coronary atherosclerosis 01/24/2009  . DEGENERATIVE JOINT DISEASE 01/23/2009  . ARTHRITIS, LEFT KNEE 12/13/2008  .  HERNIATED LUMBAR DISC 12/13/2008  . Hypothyroidism 11/21/2007  . Hyperlipidemia 11/09/2007  . ANEMIA 11/09/2007  . Essential hypertension 11/09/2007  . Osteopenia 11/09/2007   Past Medical History:  Past Medical History:  Diagnosis Date  . Blood transfusion without reported diagnosis   . Chronic low back pain   . Hyperlipidemia   . Osteopenia   . Unspecified essential hypertension   . Unspecified hypothyroidism    Past Surgical History:  Past Surgical History:  Procedure Laterality Date  . ABDOMINAL HYSTERECTOMY  1970  . CHOLECYSTECTOMY  07/2009   Dr. Rise Patience  . COLONOSCOPY  2003  . FLEXIBLE SIGMOIDOSCOPY  2010  . HAND SURGERY    . IR ANGIO INTRA EXTRACRAN SEL COM CAROTID INNOMINATE UNI L MOD SED  11/29/2016  . IR ANGIO VERTEBRAL SEL SUBCLAVIAN INNOMINATE UNI R MOD SED  11/29/2016  . IR PERCUTANEOUS ART THROMBECTOMY/INFUSION INTRACRANIAL INC DIAG ANGIO  11/29/2016  . LUMBAR LAMINECTOMY  11/2008   Done by Dr. Patrice Paradise  . RADIOLOGY WITH ANESTHESIA N/A 11/29/2016   Procedure: RADIOLOGY WITH ANESTHESIA;  Surgeon: Radiologist, Medication, MD;  Location: [facility];  Service: Radiology;  Laterality: N/A;  . THYROIDECTOMY     HPI:  Valerie Mcclure a 76 y.o.femalewith medical history significant of recent L MCA stroke in May, had mechanical thrombectomy and subsequent hemorrhagic conversion, readmission from rehab for opiate overdose and discharge to SNF, A.Fib. Admitted 6/25 with worsened right sided weakness and new onset aphasia. MRI Findings are consistent with interval acute/early subacute infarction with reduced diffusion within left caudate body extending in the periventricular white matter and within the left  medial thalamus, increased in distribution in comparison with prior MRI of the brain. MBS 12/25/16 flash penetration to cords with thin; chin tuck and straw sip facilitated penetration above the cords. Dys 2, thin with chin tuck and small controlled sips with straw.     Assessment / Plan / Recommendation Clinical Impression  Pt has history of apraxia and aphasia from left CVA on 11/2016 and worked with [facility] on inpatient rehab. Difficult to discern current language-cognitive abilities without family present as pt demonstrating moderate expressive aphasia marked by dysnomia, semantic and phonemic paraphasias and stimulable with phrase completion cues. Comprehension for biographical and enviromental yes/no questions was 60%. Perseverative at word level throughout and recognized some of her errors. Pt would benefit from continued services for functional communication and cognitive abilities.     SLP Assessment  SLP Recommendation/Assessment: Patient needs continued Speech Lanaguage Pathology Services SLP Visit Diagnosis: Aphasia (R47.01);Cognitive communication deficit (R41.841)    Follow Up Recommendations   (TBD)    Frequency and Duration min 2x/week  2 weeks      SLP Evaluation Cognition  Overall Cognitive Status: Difficult to assess (ST in CIR May/June focused on aphasia/swallow, family not pr) Arousal/Alertness: Awake/alert Orientation Level: Oriented to person;Disoriented to situation;Oriented to place;Disoriented to time Attention: Sustained Sustained Attention: Appears intact Memory:  (to be assessed further) Awareness: Impaired Awareness Impairment: Intellectual impairment;Emergent impairment Problem Solving:  (TBA) Safety/Judgment: Impaired       Comprehension  Auditory Comprehension Overall Auditory Comprehension: Impaired Yes/No Questions: Impaired Basic Biographical Questions: 51-75% accurate (60%) Basic Immediate Environment Questions: 50-74% accurate Complex Questions: Other (comment) (60%) Visual Recognition/Discrimination Discrimination: Not tested Reading Comprehension Reading Status:  (TBA)    Expression Expression Primary Mode of Expression: Verbal Verbal Expression Overall Verbal Expression: Impaired Initiation: No  impairment Level of Generative/Spontaneous Verbalization: Sentence;Conversation Repetition:  (TBA) Naming: Impairment Responsive: Not tested Confrontation: Impaired (object naming 0% without cues) Convergent: Not tested Divergent: Not tested Verbal Errors: Aware of errors;Semantic paraphasias;Phonemic paraphasias;Perseveration Pragmatics: No impairment Effective Techniques: Sentence completion Written Expression Dominant Hand: Right Written Expression:  (Difficulty d/t aphasia and non dominent hand)   Oral / Motor  Oral Motor/Sensory Function Overall Oral Motor/Sensory Function: Moderate impairment Facial ROM: Reduced right;Suspected CN VII (facial) dysfunction Facial Symmetry: Abnormal symmetry right;Suspected CN VII (facial) dysfunction Facial Strength: Reduced right;Suspected CN VII (facial) dysfunction Facial Sensation: Reduced right;Suspected CN V (Trigeminal) dysfunction Lingual ROM: Suspected CN XII (hypoglossal) dysfunction;Reduced right Lingual Symmetry: Within Functional Limits Motor Speech Overall Motor Speech: Appears within functional limits for tasks assessed Respiration: Within functional limits Phonation: Normal Resonance: Within functional limits Articulation: Within functional limitis Intelligibility: Intelligible Motor Planning:  (assess further)   GO          Functional Assessment Tool Used: skilled clinical observation Functional Limitations: Spoken language expressive Swallow Current Status ([id number]): At least 60 percent but less than 80 percent impaired, limited or restricted Swallow Goal Status [phone]): At least 40 percent but less than 60 percent impaired, limited or restricted Spoken Language Expression Current Status [phone]): At least 60 percent but less than 80 percent impaired, limited or restricted Spoken Language Expression Goal Status [phone]): At least 40 percent but less than 60 percent impaired, limited or restricted         Houston Siren 01/12/2017, 1:00 PM   Orbie Pyo Colvin Caroli.Ed [employer] [phone]

## 2017-01-12 NOTE — Progress Notes (Signed)
STROKE TEAM PROGRESS NOTE   HISTORY OF PRESENT ILLNESS (per record) [name] a 76 y.o.femalewith history of chronic abdominal and LBP, CKD, hyperlipidemia who fell out of bed on 5/13 am and was found to have left facial weakness with slurred speech and right sided weakness.  CT Head showed hyperdense L-MCA sign with left M1 embolic occlusion.  CT angio with complete revascularization of L-MCA with reperfusion with hemorrhagic transformation of left temporal infarct and minimal SDH.  MRI brain showed L-MCA infarct with diffusion restriction left temporal lobe, superimposed 5.5 cm intra-axial hemorrhage centered at left basal ganglia with associated left hemisphere edema, effaced left lateral ventricle with trace IVH and occasional small foci of restriction in left thalamus and anterior left occipital lobe.  She was diagnosed with new-onset atrial fibrillation and started on Eliquis.    She presents back to the ED on 01/11/2017 with worsened right upper and lower extremity weakness with new onset aphasia on Saturday. The weakness has resolved back to her baseline of post-stroke right sided weakness, but the aphasia continues. She was just discharged from rehab on Thursday. She was receiving her Eliquis in rehab but did not receive her doses on Friday or Saturday due to troubles either with finances or with family ability to get to the pharmacy to have the prescriptions filled, according to her daughters, who are unable to specify the circumstances in more detail. She was restarted on Eliquis on Sunday after the prescription was filled. Daughters state that the situation in the patient's home is chaotic, with illicit drug use taking place and an overdose on heroin by one family member recently; there are no other family members present to corroborate this.   Patient was not administered IV t-PA secondary to recent [place]. She was admitted to General Neurology for further evaluation and  treatment.   SUBJECTIVE (INTERVAL HISTORY) Her daughter is at the bedside. Patient had worsening of her speech and right-sided weakness last Saturday which has not returned back to her baseline   OBJECTIVE Temp:  [97.7 F (36.5 C)-98.8 F (37.1 C)] 98.4 F (36.9 C) (06/26 1421) Pulse Rate:  [62-97] 78 (06/26 1421) Cardiac Rhythm: Normal sinus rhythm (06/26 0806) Resp:  [14-20] 16 (06/26 1421) BP: (83-118)/(35-83) 108/56 (06/26 1501) SpO2:  [75 %-100 %] 94 % (06/26 1421)  CBC:  Recent Labs Lab 01/11/17 1841  WBC 10.5  NEUTROABS 7.5  HGB 10.9*  HCT 34.7*  MCV 93.8  PLT 254    Basic Metabolic Panel:  Recent Labs Lab 01/11/17 1841 01/12/17 0705  NA 139 139  K 4.8 4.1  CL 104 105  CO2 28 24  GLUCOSE 127* 129*  BUN 48* 40*  CREATININE 3.02* 2.46*  CALCIUM 9.2 9.0    Lipid Panel:    Component Value Date/Time   CHOL 162 11/30/2016 0329   TRIG 175 (H) 11/30/2016 0329   HDL 42 11/30/2016 0329   CHOLHDL 3.9 11/30/2016 0329   VLDL 35 11/30/2016 0329   LDLCALC 85 11/30/2016 0329   HgbA1c:  Lab Results  Component Value Date   HGBA1C 5.8 (H) 11/30/2016   Urine Drug Screen:    Component Value Date/Time   LABOPIA POSITIVE (A) 01/12/2017 0053   COCAINSCRNUR NONE DETECTED 01/12/2017 0053   LABBENZ NONE DETECTED 01/12/2017 0053   AMPHETMU NONE DETECTED 01/12/2017 0053   THCU NONE DETECTED 01/12/2017 0053   LABBARB NONE DETECTED 01/12/2017 0053    Alcohol Level No results found for: [place]  Head Wo Contrast 01/11/2017 IMPRESSION: Evolutionary changes and the area of hemorrhage in the left basal ganglia and prior infarcts seen in the anterior left temporal lobe. No acute infarction or hemorrhage.  Mr Brain 69 Contrast Mr Jodene Nam Head/brain Wo Cm 01/12/2017 IMPRESSION: 1. Reduced diffusion within left caudate body extending in the periventricular white matter and within the left medial thalamus increased in distribution in comparison with prior MRI of the  brain. Findings are consistent with interval acute/early subacute infarction. 2. Decreased size of late subacute hematoma within the left basal ganglia. Left lateral ventricle hemosiderin staining. 3. Left anterior temporal lobe encephalomalacia from prior infarction. 4. Patent circle of Willis. No large vessel occlusion, aneurysm, or significant stenosis is identified.      PHYSICAL EXAM Frail elderly Caucasian lady currently not in distress. . Afebrile. Head is nontraumatic. Neck is supple without bruit.    Cardiac exam no murmur or gallop. Lungs are clear to auscultation. Distal pulses are well felt. Neurological Exam :  Awake alert moderate expressive aphasia as well as dysarthria. Can speak short sentences and simple words. Does have significant word hesitation and aphasic errors. Difficulty with naming and repetition. Comprehension appears better preserved. Extraocular movements appear full range but there is some left gaze preference. Decreased blink to threat on the right compared to the left. Pupils equal reactive. Vision acuity seems adequate. Fundi were not visualized. Right lower facial weakness. Tongue midline. Hearing is intact. Motor system exam shows right hemiplegia with 3/5 right upper extremity and 4/5 right lower extremity strength with significant weakness of right grip and intrinsic hand muscles and right ankle dorsiflexors. Normal strength on the left. Tone is increased on the right side. The tendon reflexes are brisker on the right compared to the left. Lantus upgoing on the right and downgoing on the left. Sensation appears preserved bilaterally. Gait was not tested. ASSESSMENT/PLAN Ms. SUANN KLIER is a 76 y.o. female with history of chronic abdominal and LBP, CKD, hyperlipidemia presenting 01/11/2017 with worsened right upper and lower extremity weakness with new onset aphasia after revascularization of L-MCA with hemorrhagic transformation on 11/30/2016. She did not receive  IV t-PA due to recent ICH.    Stroke:  acute/early subacute infarction of left caudate body extending in the periventricular white matter and within the left medial thalamus likely extension of recent stroke from large vessel disease status post left MCA mechanical temp thrombectomy in May 2018. Recent intracerebral hemorrhage following mechanical embolectomy has fortunately remained stable despite being started on eliquis  CT head: Evolutionary changes and the area of hemorrhage in the left basal ganglia and prior infarcts seen in the anterior left temporal lobe  MRI head: acute/early subacute infarction ofl eft caudate body extending in the periventricular white matter and within the left medial thalamus  MRA head: Patent circle of Willis. No large vessel occlusion, aneurysm, or significant stenosis is identified.   2D Echo: EF 60-65%. No source of embolus  Vas [place] BLE: no DVT   LDL 85  HgbA1c 5.8  SCDs for VTE prophylaxis  DIET DYS 2 Room service appropriate? Yes; Fluid consistency: Nectar Thick  Eliquis (apixaban) daily prior to admission, now on Eliquis (apixaban) daily  Patient counseled to be compliant with her antithrombotic medications  Ongoing aggressive stroke risk factor management  Therapy recommendations: CIR;Supervision for mobility/OOB  Disposition:  Pending   Hyperlipidemia  Home meds: none,   LDL 85, goal < 70  Continue statin  Other Stroke Risk Factors  Advanced age  Hx stroke/TIA  Opioid abuse  Other Active Problems  None  Hospital day # 0  I have personally examined this patient, reviewed notes, independently viewed imaging studies, participated in medical decision making and plan of care.ROS completed by me personally and pertinent positives fully documented  I have made any additions or clarifications directly to the above note. She had recent left middle cerebral artery infarct in May 2018 and underwent mechanical embolectomy and had  shown moderate improvement with mild residual expressive aphasia and right hemiparesis that has had recent clinical worsening likely due to subacute extension of recent infarct. Fortunately the patient's recent intracerebral hemorrhage has not extended despite being on eliquis. MRA however shows patent left cerebral artery. Patient does have a atrial fibrillation and was on eliquis. I had a long discussion the patient and husband regarding lack of definite clinical data suggesting switching eliquis to alternative anticoagulant being any better. After discussion patient and husband agreed to stay on eliquis now. Recommend physical occupational therapy and rehabilitation consults. No need for extensive stroke workup given patient's recent admission for the same problem. Greater than 50% time during this 35 minute visit was spent on counseling and coordination of care for atrial fibrillation stroke risk discussion of treatment options and answered questions. Antony Contras, MD Medical Director [place] Pager: [phone] 01/12/2017 6:25 PM   To contact Stroke Continuity provider, please refer to [email or web address] After hours, contact General Neurology

## 2017-01-12 NOTE — Care Management Obs Status (Signed)
[place] NOTIFICATION   Patient Details  Name: SEPHORA BOYAR MRN: [id number] Date of Birth: 09/27/1940   Medicare Observation Status Notification Given:  Yes    Carles Collet, RN 01/12/2017, 11:09 AM

## 2017-01-12 NOTE — Progress Notes (Signed)
Pt was admitted from ED per stretcher accompanied by nurse tech and pt family on arrival to the floor pt was lethergic  Confuse was able to tell me her name and could not  answer any other questions right, having aphasia and lt facial droop and right sided weakness, per pt two daughters all presented symptoms are not new accept aphasia and drowsiness, pt ID bracelet verified  with pt family skin assessment done,  fall risk hook to tele  Assessment done pt resting on bed with bed alarm on, will continue to monitor pt

## 2017-01-12 NOTE — Patient Outreach (Signed)
[facility]) Care Management  01/12/2017  Valerie Mcclure 1941-03-08 [id number]  EMMI-[place] RED ON EMMI ALERT DAY# 3: DATE:01/10/17 RED ALERT:Felling Worse Overall   Outreach attempt #2 to patient. No answer. RN CM left HIPAA compliant message along with contact info.   Plan: RNCM will contact patient within one business day.   [name], RN, BSN, MHA/MSL, Cross Telephonic Care Manager Coordinator [facility] Direct Phone: [phone] Toll Free: [phone] Fax: [phone]

## 2017-01-12 NOTE — ED Notes (Signed)
Pt to MR via stretcher, instructions given to transport to call ED for staff to take patient to floor.

## 2017-01-12 NOTE — Evaluation (Addendum)
Occupational Therapy Evaluation Patient Details Name: CATRICE ZULETA MRN: [id number] DOB: 09-02-1940 Today's Date: 01/12/2017    History of Present Illness Yuleni Burich [name] a 76 y.o.femalewith medical history significant of recent L MCA stroke in May, had mechanical thrombectomy and subsequent hemorrhagic conversion, readmission from rehab for opiate overdose and discharge to SNF, A.Fib. Admitted 6/25 with worsened right sided weakness and new onset aphasia. MRI findings are consistent with interval acute/early subacute infarction with reduced diffusion within left caudate body extending in the periventricular white matter and within the left medial thalamus, increased in distribution in comparison with prior MRI of the brain. PMH significant for recent L MCA stroke in May, chronic low back pain, hyperlipidemia, osetopenia, unspecified essential hypertension, unspecified hypothyroidism.   Clinical Impression   PTA, pt was able to complete functional mobility without assistance and required some assistance with dressing/bathing tasks but was able to participate. She has 24 hour assistance available from her family. Pt currently requires mod assist for UB ADL, max assist for LB ADL and toileting hygiene, and min assist for seated grooming tasks. She additionally requires heavy mod assist for stand-pivot toilet transfers. She presents with decreased functional use of R UE with poor coordination, strength, and motor planning skills as well as decreased communication and poor seated and standing balance impacting her ability to participate in ADL tasks. Feel she would benefit from intensive rehabilitation at CIR level in order to maximize independence and safety with ADL and functional mobility in preparation for return home with family. OT will continue to follow while admitted.     Follow Up Recommendations  CIR;Supervision/Assistance - 24 hour    Equipment Recommendations  Other (comment)  (TBD at next venue of care)    Recommendations for Other Services Rehab consult     Precautions / Restrictions Precautions Precautions: Fall Precaution Comments: R sided weakness, R inattention Restrictions Weight Bearing Restrictions: No      Mobility Bed Mobility Overal bed mobility: Needs Assistance Bed Mobility: Rolling;Sidelying to Sit Rolling: Min guard Sidelying to sit: Mod assist       General bed mobility comments: Multimodal cues for sequencing and initiation. Noted poor motor planning with attempting to roll and maneuver B LE off of bed.   Transfers Overall transfer level: Needs assistance Equipment used: 1 person hand held assist Transfers: Sit to/from [facility] Sit to Stand: Mod assist Stand pivot transfers: Mod assist       General transfer comment: Initially attempted with RW unsuccessfully (see ADL section above for details). Mod lifting and steadying assist with pt demonstrating significant posterior lean in standing position.     Balance Overall balance assessment: Needs assistance Sitting-balance support: Single extremity supported;Feet supported;Feet unsupported Sitting balance-Leahy Scale: Poor Sitting balance - Comments: Min guard assist for EOB ADL.  Postural control: Posterior lean Standing balance support: Bilateral upper extremity supported Standing balance-Leahy Scale: Poor Standing balance comment: Posterior lean with pt requiring mod physical assistance and B UE support.                            ADL either performed or assessed with clinical judgement   ADL Overall ADL's : Needs assistance/impaired Eating/Feeding: Minimal assistance;Sitting   Grooming: Minimal assistance;Sitting   Upper Body Bathing: Moderate assistance;Sitting   Lower Body Bathing: Sit to/from stand;Maximal assistance   Upper Body Dressing : Moderate assistance;Sitting   Lower Body Dressing: Sit to/from stand;Maximal assistance    Toilet Transfer:  Moderate assistance;Stand-pivot Toilet Transfer Details (indicate cue type and reason): Attempting use of RW initially as pt reporting that she was using this at home (family later clarified that she was not utilizing AD). Pt with inattention to R side and inability to grasp RW with R hand and completed transfer without RW consequently.  Toileting- Clothing Manipulation and Hygiene: Maximal assistance;Sit to/from stand       Functional mobility during ADLs: Moderate assistance (stand-pivot transfer only) General ADL Comments: Poor coordination and control of R UE impacting her ability to participate.      Vision   Vision Assessment?: Vision impaired- to be further tested in functional context Additional Comments: Will continue to assess.     Perception     Praxis Praxis Praxis tested?: Deficits Deficits: Ideomotor;Ideation;Organization Praxis-Other Comments: Motor planning deficits limiting independence with bed mobility.     Pertinent Vitals/Pain Pain Assessment: No/denies pain     Hand Dominance Right   Extremity/Trunk Assessment Upper Extremity Assessment Upper Extremity Assessment: RUE deficits/detail RUE Deficits / Details: Unable to sustain contraction throughout RUE with jerking/tremoring movement. Able to complete full AROM but unable to sustain position.  RUE Coordination: decreased gross motor;decreased fine motor   Lower Extremity Assessment Lower Extremity Assessment: Defer to PT evaluation       Communication Communication Communication: Expressive difficulties (Aphasia)   Cognition Arousal/Alertness: Awake/alert Behavior During Therapy: WFL for tasks assessed/performed Overall Cognitive Status: Difficult to assess Area of Impairment: Attention;Following commands;Safety/judgement                   Current Attention Level: Selective   Following Commands: Follows one step commands consistently;Follows multi-step commands with  increased time Safety/Judgement: Decreased awareness of safety     General Comments: Expressive difficulties making full cognitive assessment difficult. Pt able to follow multi-step commands with increased time and appears to demonstrate decreased safety awareness during mobility tasks.   General Comments  Husband and daughter arrived at end of session. Husband kissing pt repetitively on arrival.     Exercises     Shoulder Instructions      Home Living Family/patient expects to be discharged to:: Private residence Living Arrangements: Spouse/significant other Available Help at Discharge: Family;Available 24 hours/day Type of Home: Apartment Home Access: Level entry     Home Layout: One level     Bathroom Shower/Tub: Tub/shower unit         Home Equipment: Bedside commode;Cane - single point   Additional Comments: Majority of home information taken from previous admission and pt's husband at end of session on his arrival.       Prior Functioning/Environment Level of Independence: Needs assistance  Gait / Transfers Assistance Needed: Pt/family reports ambulating without AD ADL's / Homemaking Assistance Needed: Pt had recently returned home from SNF level rehab. She was requiring assistance for dressing/bathing tasks.            OT Problem List: Decreased strength;Decreased activity tolerance;Impaired balance (sitting and/or standing);Decreased safety awareness;Decreased knowledge of use of DME or AE;Decreased knowledge of precautions;Decreased cognition;Decreased coordination;Impaired vision/perception;Impaired UE functional use      OT Treatment/Interventions: Self-care/ADL training;Therapeutic exercise;Neuromuscular education;Energy conservation;DME and/or AE instruction;Therapeutic activities;Patient/family education;Balance training    OT Goals(Current goals can be found in the care plan section) Acute Rehab OT Goals Patient Stated Goal: family would like for pt to  return to independence OT Goal Formulation: With patient/family Time For Goal Achievement: 01/26/17 Potential to Achieve Goals: Good ADL Goals Pt Will Perform Grooming: with supervision;sitting  Pt Will Perform Upper Body Dressing: sitting;with min guard assist Pt Will Perform Lower Body Dressing: with min assist;sit to/from stand Pt Will Transfer to Toilet: with min assist;ambulating;bedside commode (BSC over toilet) Pt Will Perform Toileting - Clothing Manipulation and hygiene: with min assist;sit to/from stand Pt/caregiver will Perform Home Exercise Program: Right Upper extremity;Increased strength;With written HEP provided;With Supervision (increased strength and coordination) Additional ADL Goal #1: Pt will demonstrate improved motor planning skills to complete bed mobility with overall supervision in preparation for seated ADL tasks at EOB.   OT Frequency: Min 3X/week   Barriers to D/C:            Co-evaluation              AM-PAC PT "6 Clicks" Daily Activity     Outcome Measure Help from another person eating meals?: A Little Help from another person taking care of personal grooming?: A Little Help from another person toileting, which includes using toliet, bedpan, or urinal?: A Lot Help from another person bathing (including washing, rinsing, drying)?: A Lot Help from another person to put on and taking off regular upper body clothing?: A Lot Help from another person to put on and taking off regular lower body clothing?: A Lot 6 Click Score: 14   End of Session Equipment Utilized During Treatment: Gait belt Nurse Communication: Mobility status;Other (comment) (Nurse tech - external catheter to be re-applied)  Activity Tolerance: Patient tolerated treatment well Patient left: in chair;with call bell/phone within reach;with chair alarm set;with family/visitor present  OT Visit Diagnosis: Cognitive communication deficit (R41.841);Hemiplegia and hemiparesis;Other  abnormalities of gait and mobility (R26.89) Symptoms and signs involving cognitive functions: Cerebral infarction Hemiplegia - Right/Left: Right Hemiplegia - dominant/non-dominant: Dominant Hemiplegia - caused by: Cerebral infarction                Time: 1696-7893 OT Time Calculation (min): 29 min Charges:  OT General Charges $OT Visit: 1 Procedure OT Evaluation $OT Eval Moderate Complexity: 1 Procedure OT Treatments $Self Care/Home Management : 8-22 mins G-Codes: OT G-codes **NOT FOR INPATIENT CLASS** Functional Assessment Tool Used: AM-PAC 6 Clicks Daily Activity Functional Limitation: Self care Self Care Current Status (Y1017): At least 40 percent but less than 60 percent impaired, limited or restricted Self Care Goal Status (P1025): At least 1 percent but less than 20 percent impaired, limited or restricted   Norman Herrlich, MS OTR/L  Pager: [place] 01/12/2017, 10:49 AM

## 2017-01-12 NOTE — ED Notes (Signed)
Family  Sent to 5W waiting area. Pt to go to MRI then will be transported to 5W

## 2017-01-12 NOTE — Evaluation (Signed)
Clinical/Bedside Swallow Evaluation Patient Details  Name: Valerie Mcclure MRN: [id number] Date of Birth: [birth year]  Today's Date: 01/12/2017 Time: SLP Start Time (ACUTE ONLY): 0859 SLP Stop Time (ACUTE ONLY): 0929 SLP Time Calculation (min) (ACUTE ONLY): 13 min  Past Medical History:  Past Medical History:  Diagnosis Date  . Blood transfusion without reported diagnosis   . Chronic low back pain   . Hyperlipidemia   . Osteopenia   . Unspecified essential hypertension   . Unspecified hypothyroidism    Past Surgical History:  Past Surgical History:  Procedure Laterality Date  . ABDOMINAL HYSTERECTOMY  1970  . CHOLECYSTECTOMY  07/2009   Dr. Rise Patience  . COLONOSCOPY  2003  . FLEXIBLE SIGMOIDOSCOPY  2010  . HAND SURGERY    . IR ANGIO INTRA EXTRACRAN SEL COM CAROTID INNOMINATE UNI L MOD SED  11/29/2016  . IR ANGIO VERTEBRAL SEL SUBCLAVIAN INNOMINATE UNI R MOD SED  11/29/2016  . IR PERCUTANEOUS ART THROMBECTOMY/INFUSION INTRACRANIAL INC DIAG ANGIO  11/29/2016  . LUMBAR LAMINECTOMY  11/2008   Done by Dr. Patrice Paradise  . RADIOLOGY WITH ANESTHESIA N/A 11/29/2016   Procedure: RADIOLOGY WITH ANESTHESIA;  Surgeon: Radiologist, Medication, MD;  Location: [place];  Service: Radiology;  Laterality: N/A;  . THYROIDECTOMY     HPI:  Valerie Mcclure a 76 y.o.femalewith medical history significant of recent L MCA stroke in May, had mechanical thrombectomy and subsequent hemorrhagic conversion, readmission from rehab for opiate overdose and discharge to SNF, A.Fib. Admitted 6/25 with worsened right sided weakness and new onset aphasia. MRI Findings are consistent with interval acute/early subacute infarction with reduced diffusion within left caudate body extending in the periventricular white matter and within the left medial thalamus, increased in distribution in comparison with prior MRI of the brain. MBS 12/25/16 flash penetration to cords with thin; chin tuck and straw sip facilitated penetration  above the cords. Dys 2, thin with chin tuck and small controlled sips with straw.    Assessment / Plan / Recommendation Clinical Impression  Pt has history of dysphagia and recommended to tuck chin with liquids following MBS 12/25/16. She does not recall having test nor compensatory strategies. Three oz water test resulted in subtle differences in respirations and red/watery eyes possibly indicative of air invasion. Slow mastication and oral transit with solid. Recommend MBS tomorrow (schedule does not allow today), Dys 2 texture, nectar thick liquids and pills whole in applesauce.  SLP Visit Diagnosis: Dysphagia, oropharyngeal phase (R13.12)    Aspiration Risk  Moderate aspiration risk    Diet Recommendation Dysphagia 2 (Fine chop);Nectar-thick liquid   Liquid Administration via: Cup;No straw Medication Administration: Whole meds with puree Supervision: Patient able to self feed;Full supervision/cueing for compensatory strategies Compensations: Slow rate;Small sips/bites Postural Changes: Seated upright at 90 degrees    Other  Recommendations Oral Care Recommendations: Oral care BID   Follow up Recommendations  (TBD)      Frequency and Duration min 2x/week  2 weeks       Prognosis Prognosis for Safe Diet Advancement: Good Barriers to Reach Goals: Cognitive deficits      Swallow Study   General HPI: Valerie Mcclure a 76 y.o.femalewith medical history significant of recent L MCA stroke in May, had mechanical thrombectomy and subsequent hemorrhagic conversion, readmission from rehab for opiate overdose and discharge to SNF, A.Fib. Admitted 6/25 with worsened right sided weakness and new onset aphasia. MRI Findings are consistent with interval acute/early subacute infarction with reduced diffusion within left caudate body  extending in the periventricular white matter and within the left medial thalamus, increased in distribution in comparison with prior MRI of the brain. MBS  12/25/16 flash penetration to cords with thin; chin tuck and straw sip facilitated penetration above the cords. Dys 2, thin with chin tuck and small controlled sips with straw.  Type of Study: Bedside Swallow Evaluation Previous Swallow Assessment:  (see HPI) Diet Prior to this Study: Dysphagia 2 (chopped);Thin liquids (NPO at hospital) Temperature Spikes Noted: No Respiratory Status: Nasal cannula History of Recent Intubation: No Behavior/Cognition: Alert;Cooperative;Pleasant mood;Requires cueing Oral Cavity Assessment: Dry Oral Care Completed by SLP: Yes Oral Cavity - Dentition: Adequate natural dentition Vision: Impaired for self-feeding Self-Feeding Abilities: Needs set up;Needs assist;Able to feed self (uses left hand) Patient Positioning: Upright in bed Baseline Vocal Quality: Normal Volitional Cough: Weak Volitional Swallow: Able to elicit    Oral/Motor/Sensory Function Overall Oral Motor/Sensory Function: Moderate impairment Facial ROM: Reduced right;Suspected CN VII (facial) dysfunction Facial Symmetry: Abnormal symmetry right;Suspected CN VII (facial) dysfunction Facial Strength: Reduced right;Suspected CN VII (facial) dysfunction Facial Sensation: Reduced right;Suspected CN V (Trigeminal) dysfunction Lingual ROM: Suspected CN XII (hypoglossal) dysfunction;Reduced right Lingual Symmetry: Within Functional Limits   Ice Chips Ice chips: Not tested   Thin Liquid Thin Liquid: Impaired Presentation: Cup Oral Phase Impairments: Reduced labial seal Oral Phase Functional Implications: Right lateral sulci pocketing Pharyngeal  Phase Impairments: Suspected delayed Swallow (? increased respirations, red eyes/face)    Nectar Thick Nectar Thick Liquid: Not tested   Honey Thick Honey Thick Liquid: Not tested   Puree Puree: Within functional limits   Solid   GO   Solid: Impaired Oral Phase Impairments: Reduced lingual movement/coordination Oral Phase Functional Implications:  Prolonged oral transit    Functional Assessment Tool Used: skilled clinical observation Functional Limitations: Swallowing Swallow Current Status (H6314): At least 60 percent but less than 80 percent impaired, limited or restricted Swallow Goal Status [phone]): At least 40 percent but less than 60 percent impaired, limited or restricted   Houston Siren 01/12/2017,9:52 AM   Orbie Pyo [place].Ed [employer] [phone]

## 2017-01-12 NOTE — ED Notes (Signed)
Attempted to call report, RN will return call 

## 2017-01-13 ENCOUNTER — Inpatient Hospital Stay (HOSPITAL_COMMUNITY): Payer: Medicare Other

## 2017-01-13 ENCOUNTER — Other Ambulatory Visit: Payer: Self-pay | Admitting: *Deleted

## 2017-01-13 ENCOUNTER — Ambulatory Visit: Payer: Self-pay | Admitting: *Deleted

## 2017-01-13 DIAGNOSIS — M545 Low back pain: Secondary | ICD-10-CM

## 2017-01-13 DIAGNOSIS — I10 Essential (primary) hypertension: Secondary | ICD-10-CM

## 2017-01-13 DIAGNOSIS — I480 Paroxysmal atrial fibrillation: Secondary | ICD-10-CM

## 2017-01-13 DIAGNOSIS — N179 Acute kidney failure, unspecified: Secondary | ICD-10-CM

## 2017-01-13 DIAGNOSIS — G8929 Other chronic pain: Secondary | ICD-10-CM

## 2017-01-13 DIAGNOSIS — N183 Chronic kidney disease, stage 3 (moderate): Secondary | ICD-10-CM

## 2017-01-13 DIAGNOSIS — D62 Acute posthemorrhagic anemia: Secondary | ICD-10-CM

## 2017-01-13 DIAGNOSIS — I69320 Aphasia following cerebral infarction: Secondary | ICD-10-CM

## 2017-01-13 LAB — BASIC METABOLIC PANEL
ANION GAP: 7 (ref 5–15)
BUN: 34 mg/dL — ABNORMAL HIGH (ref 6–20)
CHLORIDE: 109 mmol/L (ref 101–111)
CO2: 25 mmol/L (ref 22–32)
CREATININE: 1.59 mg/dL — AB (ref 0.44–1.00)
Calcium: 8.6 mg/dL — ABNORMAL LOW (ref 8.9–10.3)
GFR calc non Af Amer: 30 mL/min — ABNORMAL LOW (ref >60)
GFR, EST AFRICAN AMERICAN: 35 mL/min — AB (ref >60)
Glucose, Bld: 114 mg/dL — ABNORMAL HIGH (ref 65–99)
Potassium: 4.2 mmol/L (ref 3.5–5.1)
SODIUM: 141 mmol/L (ref 135–145)

## 2017-01-13 NOTE — Patient Outreach (Signed)
[facility]) Care Management  01/13/2017  Valerie Mcclure 13-Jan-1941 [id number]   EMMI-[place] RED ON EMMI ALERT DAY# 3: DATE:01/10/17 RED ALERT:Felling Worse Overall? Yes   Outreach attempt # 3 spoke with patient's daughter [name]). [name] reported, her mother was readmitted to [facility]. Based on Epic documentation, patient was admitted on 01/11/17 for Aphasia as late effect of stroke.    Plan: RN CM will notify [facility] CM administrative assistant regarding case closure.   [name], RN, BSN, MHA/MSL, [employer] [place] Telephonic Care Manager Coordinator [facility] Direct Phone: [phone] Toll Free: [phone] Fax: [phone]

## 2017-01-13 NOTE — Progress Notes (Signed)
Modified Barium Swallow Progress Note  Patient Details  Name: Valerie Mcclure MRN: [id number] Date of Birth: [birth year]  Today's Date: 01/13/2017  Modified Barium Swallow completed.  Full report located under Chart Review in the Imaging Section.  Brief recommendations include the following:  Clinical Impression  MBS compared to 5/22 is overall similar with minimal improvements such as improved oral control and cohesion and laryngeal intrusion was penetration on anterior vestibule wall and did not aspirate during study. Decreased motor strength led to mild vallecular residual and min pyriform sinuses. Chin tuck appeared to prevent penetration however evidence of memory deficits not recalling prior MBS or hearing a recommendation for chin tuck prior. For increased safety and general deconditioning with hospitalizations the majority of May and June, recommend she continue nectar thick liquids and Dys 2 (until able to upgrade at bedside) and pills whole in applesauce if small. Will continue ST   Swallow Evaluation Recommendations       SLP Diet Recommendations: Dysphagia 2 (Fine chop) solids;Nectar thick liquid   Liquid Administration via: Cup;No straw   Medication Administration: Whole meds with puree   Supervision: Patient able to self feed;Full supervision/cueing for compensatory strategies   Compensations: Slow rate;Small sips/bites;Minimize environmental distractions;Multiple dry swallows after each bite/sip   Postural Changes: Seated upright at 90 degrees   Oral Care Recommendations: Oral care BID        Valerie Mcclure 01/13/2017,3:25 PM   Valerie Mcclure [place].[employer] [phone]

## 2017-01-13 NOTE — Progress Notes (Signed)
Thank you for consult on [name].  Chart reviewed and note that patient admitted with acute renal failure and worsening of symptoms. She was discharge to home on 6/21 at min assist level for mobility and min to mod assist due to aphasia with apraxia.  Anticipate that she will progress to reach this level with hydration and therapy on acute. Will monitor at distance for now.Question ability of family to provide assistance needed as well as safety concerns as reported by daughters   May need SNF for therapy after discharge.

## 2017-01-13 NOTE — Consult Note (Signed)
Physical Medicine and Rehabilitation Consult   Reason for Consult: extension of stroke with worsening of right sided weakness with apraxia and aphasia Referring Physician: Dr. Wendee Beavers.    HPI: Valerie Mcclure is a 76 y.o. female with history of chronic LBP, CKD, L-MCA infarct s/p revascularization with hemorrhagic transformation with resultant right sided weakness, aphasia and inattention and new diagnosis of AFib. She underwent CIR  stay complicated by encephalopathy due to narcotics (discontiued)  and was discharged to home 6/21 at min assist level. She was readmitted 01/11/17 with increase in aphasia and worsening of right sided weakness. She had not received Eliquis X 2 days at home and MS contin had been resumed. Daughters expressed concerns about safety at home with illicit drug use and heroin overdose by a family member recently. UDS positive for opiates. She was started on IV fluid for hydration and  MRI brain done showing interval acute/early subacute infarct in left caudate extending to periventricular and left medial thalamus, left anterior temporal lobe encephalomalacia, decrease in size of left basal ganglia subacute hematoma. TPA not administered due to recent ICH.  Dr. [name] recommended continuing Eliquis and compliance with antithrombotic medications.   MBS done today to evaluate swallow and patient on dysphagia 2, nectar liquids due to dysphagia and aspiration risk. Cognitive evaluation done revealing perseverative language with moderate expressive aphasia and comprehension 60% intact for biographic and Y/N questions. PT/OT evaluations done revealing decrease in RUE motor planning, decreased balance, RLE ataxia and weakness causing decline in mobility and ability to carry out ADL tasks. CIR recommended for follow up therapy.     Review of Systems  Unable to perform ROS: Language      Past Medical History:  Diagnosis Date  . Blood transfusion without reported diagnosis     . Chronic low back pain   . Hyperlipidemia   . Osteopenia   . Unspecified essential hypertension   . Unspecified hypothyroidism    Past Surgical History:  Procedure Laterality Date  . ABDOMINAL HYSTERECTOMY  1970  . CHOLECYSTECTOMY  07/2009   Dr. Rise Patience  . COLONOSCOPY  2003  . FLEXIBLE SIGMOIDOSCOPY  2010  . HAND SURGERY    . IR ANGIO INTRA EXTRACRAN SEL COM CAROTID INNOMINATE UNI L MOD SED  11/29/2016  . IR ANGIO VERTEBRAL SEL SUBCLAVIAN INNOMINATE UNI R MOD SED  11/29/2016  . IR PERCUTANEOUS ART THROMBECTOMY/INFUSION INTRACRANIAL INC DIAG ANGIO  11/29/2016  . LUMBAR LAMINECTOMY  11/2008   Done by Dr. Patrice Paradise  . RADIOLOGY WITH ANESTHESIA N/A 11/29/2016   Procedure: RADIOLOGY WITH ANESTHESIA;  Surgeon: Radiologist, Medication, MD;  Location: [facility];  Service: Radiology;  Laterality: N/A;  . THYROIDECTOMY     Family History  Problem Relation Age of Onset  . Heart disease Father 101       MI age 36s  . Lung cancer Brother 62  . Colon cancer Neg Hx   . Esophageal cancer Neg Hx   . Rectal cancer Neg Hx   . Stomach cancer Neg Hx    Social History:  reports that she has never smoked. She has never used smokeless tobacco. She reports that she does not drink alcohol or use drugs. Allergies:  Allergies  Allergen Reactions  . Shrimp [Shellfish Allergy] Anaphylaxis and Rash    Break outs and swelling of the throat  . Tandem Plus [Fefum-Fepo-Fa-B Cmp-C-Zn-Mn-Cu] Nausea And Vomiting  . Ivp Dye [Iodinated Diagnostic Agents] Rash    itching  . Penicillins  Itching and Rash    Has patient had a PCN reaction causing immediate rash, facial/tongue/throat swelling, SOB or lightheadedness with hypotension: Yes Has patient had a PCN reaction causing severe rash involving mucus membranes or skin necrosis: No Has patient had a PCN reaction that required hospitalization: No Has patient had a PCN reaction occurring within the last 10 years: No If all of the above answers are "NO", then may proceed  with Cephalosporin use.    Medications Prior to Admission  Medication Sig Dispense Refill  . apixaban (ELIQUIS) 5 MG TABS tablet Take 1 tablet (5 mg total) by mouth 2 (two) times daily. 60 tablet 0  . Calcium Carbonate-Vit D-Min (CALCIUM 1200 PO) Take 1 tablet by mouth daily.     . Cholecalciferol (VITAMIN D3) 1000 UNITS CAPS Take 1,000 Units by mouth daily.     Marland Kitchen FLUoxetine (PROZAC) 20 MG capsule Take 1 capsule (20 mg total) by mouth daily. 30 capsule 11  . furosemide (LASIX) 20 MG tablet Take 1 tablet (20 mg total) by mouth daily. 30 tablet 0  . levothyroxine (SYNTHROID, LEVOTHROID) 88 MCG tablet Take 1 tablet (88 mcg total) by mouth daily. 90 tablet 1  . metoprolol tartrate (LOPRESSOR) 25 MG tablet Take 1 tablet (25 mg total) by mouth 2 (two) times daily. 60 tablet 0  . morphine (MS CONTIN) 15 MG 12 hr tablet Take 15 mg by mouth every 12 (twelve) hours.    . nortriptyline (PAMELOR) 10 MG capsule Take 2 capsules (20 mg total) by mouth at bedtime. 180 capsule 1  . potassium chloride SA (K-DUR,KLOR-CON) 20 MEQ tablet Take 1 tablet (20 mEq total) by mouth daily. 30 tablet 0  . simvastatin (ZOCOR) 10 MG tablet TAKE 1 TABLET(10 MG) BY MOUTH DAILY 90 tablet 3  . valsartan (DIOVAN) 160 MG tablet Take 1 tablet (160 mg total) by mouth daily. 90 tablet 3  . vitamin B-12 (CYANOCOBALAMIN) 100 MCG tablet Take 100 mcg by mouth daily.      Home: Home Living Family/patient expects to be discharged to:: Private residence Living Arrangements: Spouse/significant other Available Help at Discharge: Family, Available 24 hours/day Type of Home: Apartment Home Access: Level entry [place]: One level Bathroom Shower/Tub: Tub/shower unit Home Equipment: Bedside commode, Cane - single point Additional Comments: Majority of home information taken from previous admission and pt's husband at end of session on his arrival.   Functional History: Prior Function Level of Independence: Needs assistance Gait /  Transfers Assistance Needed: Pt/family reports ambulating without AD ADL's / Homemaking Assistance Needed: Pt had recently returned home from SNF level rehab. She was requiring assistance for dressing/bathing tasks. Functional Status:  Mobility: Bed Mobility Overal bed mobility: Needs Assistance Bed Mobility: Supine to Sit Rolling: Min guard Sidelying to sit: Mod assist Supine to sit: Mod assist, HOB elevated General bed mobility comments: assist to elevate trunk into sitting and scoot hips toward EOB; multimodal cues for use of R UE; pt able to maintain sitting balance EOB Transfers Overall transfer level: Needs assistance Equipment used: Rolling walker (2 wheeled), 1 person hand held assist Transfers: Sit to/from Stand Sit to Stand: Min assist, Mod assist Stand pivot transfers: Mod assist General transfer comment: min A to power up into standing and mod A upon standing for balance Ambulation/Gait Ambulation/Gait assistance: Mod assist Ambulation Distance (Feet): 6 Feet Assistive device: Rolling walker (2 wheeled) Gait Pattern/deviations: Ataxic, Shuffle, Narrow base of support General Gait Details: RLE buckling at times when trying to support single  leg transition, very ataxic in step coordination. Moderate physical assist for stability and manual assist to keep RUE on RW, max VCs for attention    ADL: ADL Overall ADL's : Needs assistance/impaired Eating/Feeding: Minimal assistance, Sitting Grooming: Minimal assistance, Sitting Upper Body Bathing: Moderate assistance, Sitting Lower Body Bathing: Sit to/from stand, Maximal assistance Upper Body Dressing : Moderate assistance, Sitting Lower Body Dressing: Sit to/from stand, Maximal assistance Toilet Transfer: Moderate assistance, Stand-pivot Toilet Transfer Details (indicate cue type and reason): Attempting use of RW initially as pt reporting that she was using this at home (family later clarified that she was not utilizing AD).  Pt with inattention to R side and inability to grasp RW with R hand and completed transfer without RW consequently.  Toileting- Clothing Manipulation and Hygiene: Maximal assistance, Sit to/from stand Functional mobility during ADLs: Moderate assistance (stand-pivot transfer only) General ADL Comments: Poor coordination and control of R UE impacting her ability to participate.   Cognition: Cognition Overall Cognitive Status: Difficult to assess Arousal/Alertness: Awake/alert Orientation Level: Oriented to person, Disoriented to place, Disoriented to time, Disoriented to situation Attention: Sustained Sustained Attention: Appears intact Memory:  (to be assessed further) Awareness: Impaired Awareness Impairment: Intellectual impairment, Emergent impairment Problem Solving:  (TBA) Safety/Judgment: Impaired Cognition Arousal/Alertness: Awake/alert Behavior During Therapy: WFL for tasks assessed/performed Overall Cognitive Status: Difficult to assess Area of Impairment: Problem solving, Attention, Safety/judgement Current Attention Level: Selective Following Commands: Follows one step commands consistently, Follows multi-step commands with increased time Safety/Judgement: Decreased awareness of deficits, Decreased awareness of safety Awareness: Intellectual Problem Solving: Decreased initiation, Requires verbal cues, Requires tactile cues General Comments: at times following commands with oposite site Difficult to assess due to: Impaired communication  Blood pressure (!) 127/58, pulse 90, temperature 98 F (36.7 C), temperature source Oral, resp. rate 18, height 5\' 3"  (1.6 m), SpO2 100 %. Physical Exam  Vitals reviewed. Constitutional: She appears well-developed and well-nourished. No distress.  HENT:  Head: Normocephalic and atraumatic.  Mouth/Throat: Oropharynx is clear and moist.  Eyes: Conjunctivae and EOM are normal. Pupils are equal, round, and reactive to light.  Neck: Normal  range of motion. Neck supple.  Cardiovascular: Normal rate and regular rhythm.   Respiratory: Effort normal and breath sounds normal. No stridor. No respiratory distress.  GI: Soft. Bowel sounds are normal.  Musculoskeletal: She exhibits no edema or tenderness.  Neurological: She is alert.  Right facial weakness with mild inattention.  Expressive > receptive deficits.   Able answer Y/N biographic questions.  RUE ataxia with apraxia with motor strength 4/5.  RLE ataxia with apraxia with motor strength 4-/5. LUE: 4+/5 proximal to distal LLE: 4/5 proximal to distal She was able to point and follow simple two step commands with LUE.   Skin: Skin is warm and dry. She is not diaphoretic.  Psychiatric: She has a normal mood and affect. Her behavior is normal.    Results for orders placed or performed during the hospital encounter of 01/11/17 (from the past 24 hour(s))  Basic metabolic panel     Status: Abnormal   Collection Time: 01/13/17  7:19 AM  Result Value Ref Range   Sodium 141 135 - 145 mmol/L   Potassium 4.2 3.5 - 5.1 mmol/L   Chloride 109 101 - 111 mmol/L   CO2 25 22 - 32 mmol/L   Glucose, Bld 114 (H) 65 - 99 mg/dL   BUN 34 (H) 6 - 20 mg/dL   Creatinine, Ser 1.59 (H) 0.44 - 1.00 mg/dL  Calcium 8.6 (L) 8.9 - 10.3 mg/dL   GFR calc non Af Amer 30 (L) >60 mL/min   GFR calc Af Amer 35 (L) >60 mL/min   Anion gap 7 5 - 15   Ct Head Wo Contrast  Result Date: 01/11/2017 CLINICAL DATA:  Recent fall.  12/19/2016 EXAM: CT HEAD WITHOUT CONTRAST TECHNIQUE: Contiguous axial images were obtained from the base of the skull through the vertex without intravenous contrast. COMPARISON:  12/19/2016 FINDINGS: Brain: Low -density within the left basal ganglia in the area of prior infarct with ray hemorrhagic conversion. Low-density in the anterior left temporal lobe is also in the area of prior infarct. No acute infarction or hemorrhage. No hydrocephalus. No mass effect or midline shift. Vascular: No  hyperdense vessel or unexpected calcification. Skull: No acute calvarial abnormality. Sinuses/Orbits: Visualized paranasal sinuses and mastoids clear. Orbital soft tissues unremarkable. Other: None IMPRESSION: Evolutionary changes and the area of hemorrhage in the left basal ganglia and prior infarcts seen in the anterior left temporal lobe. No acute infarction or hemorrhage. Electronically Signed   By: Rolm Baptise M.D.   On: 01/11/2017 14:50   Mr Brain Wo Contrast  Result Date: 01/12/2017 CLINICAL DATA:  76 y/o F; worsening right upper and lower extremity weakness and new onset aphasia Saturday. Weakness has resolved with residual aphasia. EXAM: MRI HEAD WITHOUT CONTRAST MRA HEAD WITHOUT CONTRAST TECHNIQUE: Multiplanar, multiecho pulse sequences of the brain and surrounding structures were obtained without intravenous contrast. Angiographic images of the head were obtained using MRA technique without contrast. COMPARISON:  01/11/2017 CT head.  11/30/2016 MRI of the brain. FINDINGS: MRI HEAD FINDINGS Brain: There is mildly reduced diffusion within the left caudate body extending in the periventricular white matter and within the left medial thalamus with increased distribution in comparison with areas of infection seen on the prior MRI of the brain. There is mild associated T2 FLAIR hyperintense signal abnormality. Findings are compatible with interval acute to early subacute infarction. Hematoma within the left basal ganglia is decreased in size from the prior MRI of the brain and demonstrates increased T1/T2 signal compatible with late subacute hemorrhage. T2 FLAIR hyperintense signal abnormality with pons likely representing microvascular ischemic changes. Left anterior temporal encephalomalacia corresponding to region of infarction on the prior MRI of the brain. The area of infarction demonstrates hemosiderin staining. Additionally, there is hemosiderin staining along the margins of the left lateral  ventricle frontal horn. No hydrocephalus, herniation, or evidence for interval hemorrhage. Vascular: As below. Skull and upper cervical spine: Normal marrow signal. Sinuses/Orbits: Negative. Other: None. MRA HEAD FINDINGS Internal carotid arteries:  Patent. Anterior cerebral arteries:  Patent. Middle cerebral arteries: Patent. Anterior communicating artery: Patent. Posterior communicating arteries: Fetal left PCA. No right posterior communicating artery identified, likely hypoplastic or absent. Posterior cerebral arteries:  Patent. Basilar artery:  Patent. Vertebral arteries:  Patent. No evidence of high-grade stenosis, large vessel occlusion, or aneurysm unless noted above. IMPRESSION: 1. Reduced diffusion within left caudate body extending in the periventricular white matter and within the left medial thalamus increased in distribution in comparison with prior MRI of the brain. Findings are consistent with interval acute/early subacute infarction. 2. Decreased size of late subacute hematoma within the left basal ganglia. Left lateral ventricle hemosiderin staining. 3. Left anterior temporal lobe encephalomalacia from prior infarction. 4. Patent circle of Willis. No large vessel occlusion, aneurysm, or significant stenosis is identified. Electronically Signed   By: Kristine Garbe M.D.   On: 01/12/2017 02:50   Mr  Mra Head/brain Wo Cm  Result Date: 01/12/2017 CLINICAL DATA:  76 y/o F; worsening right upper and lower extremity weakness and new onset aphasia Saturday. Weakness has resolved with residual aphasia. EXAM: MRI HEAD WITHOUT CONTRAST MRA HEAD WITHOUT CONTRAST TECHNIQUE: Multiplanar, multiecho pulse sequences of the brain and surrounding structures were obtained without intravenous contrast. Angiographic images of the head were obtained using MRA technique without contrast. COMPARISON:  01/11/2017 CT head.  11/30/2016 MRI of the brain. FINDINGS: MRI HEAD FINDINGS Brain: There is mildly reduced  diffusion within the left caudate body extending in the periventricular white matter and within the left medial thalamus with increased distribution in comparison with areas of infection seen on the prior MRI of the brain. There is mild associated T2 FLAIR hyperintense signal abnormality. Findings are compatible with interval acute to early subacute infarction. Hematoma within the left basal ganglia is decreased in size from the prior MRI of the brain and demonstrates increased T1/T2 signal compatible with late subacute hemorrhage. T2 FLAIR hyperintense signal abnormality with pons likely representing microvascular ischemic changes. Left anterior temporal encephalomalacia corresponding to region of infarction on the prior MRI of the brain. The area of infarction demonstrates hemosiderin staining. Additionally, there is hemosiderin staining along the margins of the left lateral ventricle frontal horn. No hydrocephalus, herniation, or evidence for interval hemorrhage. Vascular: As below. Skull and upper cervical spine: Normal marrow signal. Sinuses/Orbits: Negative. Other: None. MRA HEAD FINDINGS Internal carotid arteries:  Patent. Anterior cerebral arteries:  Patent. Middle cerebral arteries: Patent. Anterior communicating artery: Patent. Posterior communicating arteries: Fetal left PCA. No right posterior communicating artery identified, likely hypoplastic or absent. Posterior cerebral arteries:  Patent. Basilar artery:  Patent. Vertebral arteries:  Patent. No evidence of high-grade stenosis, large vessel occlusion, or aneurysm unless noted above. IMPRESSION: 1. Reduced diffusion within left caudate body extending in the periventricular white matter and within the left medial thalamus increased in distribution in comparison with prior MRI of the brain. Findings are consistent with interval acute/early subacute infarction. 2. Decreased size of late subacute hematoma within the left basal ganglia. Left lateral  ventricle hemosiderin staining. 3. Left anterior temporal lobe encephalomalacia from prior infarction. 4. Patent circle of Willis. No large vessel occlusion, aneurysm, or significant stenosis is identified. Electronically Signed   By: Kristine Garbe M.D.   On: 01/12/2017 02:50    Assessment/Plan: Diagnosis: extension of stroke with worsening of right sided weakness with apraxia and aphasia Labs and images independently reviewed.  Records reviewed and summated above. Stroke: Continue secondary stroke prophylaxis and Risk Factor Modification listed below:   Antiplatelet therapy:   Blood Pressure Management:  Continue current medication with prn's with permisive HTN per primary team Statin Agent:   Prediabetes management: Right sided hemiparesis:  Motor recovery: Fluoxetine  1. Does the need for close, 24 hr/day medical supervision in concert with the patient's rehab needs make it unreasonable for this patient to be served in a less intensive setting? No  2. Co-Morbidities requiring supervision/potential complications: dysphagia (advance diet as tolerated), chronic LBP (Biofeedback training with therapies to help reduce reliance on opiate pain medications, monitor pain control during therapies, and sedation at rest and titrate to maximum efficacy to ensure participation and gains in therapies), AKI on CKD (avoid nephrotoxic meds), L-MCA infarct s/p revascularization with hemorrhagic transformation with resultant right sided weakness, aphasia, AFib (monitor HR with increased mobility), ABLA (transfuse if necessary to ensure appropriate perfusion for increased activity tolerance) 3. Due to bladder management, bowel management, safety,  skin/wound care, disease management, medication administration and patient education, does the patient require 24 hr/day rehab nursing? Yes 4. Does the patient require coordinated care of a physician, rehab nurse, PT (1-2 hrs/day, 5 days/week), OT (1-2 hrs/day, 5  days/week) and SLP (1-2 hrs/day, 5 days/week) to address physical and functional deficits in the context of the above medical diagnosis(es)? Yes Addressing deficits in the following areas: balance, endurance, locomotion, strength, transferring, bathing, dressing, toileting, cognition, speech, swallowing and psychosocial support 5. Can the patient actively participate in an intensive therapy program of at least 3 hrs of therapy per day at least 5 days per week? Yes 6. The potential for patient to make measurable gains while on inpatient rehab is good 7. Anticipated functional outcomes upon discharge from inpatient rehab are min assist  with PT, min assist with OT, min assist with SLP. 8. Estimated rehab length of stay to reach the above functional goals is: 7-11 days. 9. Anticipated D/C setting: Other 10. Anticipated post D/C treatments: SNF 11. Overall Rehab/Functional Prognosis: good  RECOMMENDATIONS: This patient's condition is appropriate for continued rehabilitative care in the following setting: ?Ability to care for patient at home.  Pt with recent rehab stay and will likely be close to previous level of functioning.  If unable to caare for patient at home recommend SNF with PM&R follow up. Patient has agreed to participate in recommended program. Potentially Note that insurance prior authorization may be required for reimbursement for recommended care.  Comment: Rehab Admissions Coordinator to follow up.  Delice Lesch, MD, [address] [place] 01/13/2017

## 2017-01-13 NOTE — Progress Notes (Signed)
Physical Therapy Treatment Patient Details Name: Valerie Mcclure MRN: [id number] DOB: [birth year] Today's Date: 01/13/2017    History of Present Illness Valerie Mcclure a 76 y.o.femalewith medical history significant of recent L MCA stroke in May, had mechanical thrombectomy and subsequent hemorrhagic conversion, readmission from rehab for opiate overdose and discharge to SNF, A.Fib. Admitted 6/25 with worsened right sided weakness and new onset aphasia. MRI findings are consistent with interval acute/early subacute infarction with reduced diffusion within left caudate body extending in the periventricular white matter and within the left medial thalamus, increased in distribution in comparison with prior MRI of the brain. PMH significant for recent L MCA stroke in May, chronic low back pain, hyperlipidemia, osetopenia, unspecified essential hypertension, unspecified hypothyroidism.    PT Comments    Patient is making progress toward mobility goals. Pt tolerated gait training with mod A. Pt followed commands consistently. Pt continues to demonstrate R side inattention but with R side gaze. Current plan remains appropriate.   Follow Up Recommendations  CIR;Supervision for mobility/OOB     Equipment Recommendations  Other (comment) (TBD)    Recommendations for Other Services Rehab consult     Precautions / Restrictions Precautions Precautions: Fall Precaution Comments: R sided weakness, R inattention Restrictions Weight Bearing Restrictions: No    Mobility  Bed Mobility Overal bed mobility: Needs Assistance Bed Mobility: Supine to Sit     Supine to sit: Mod assist;HOB elevated     General bed mobility comments: assist to elevate trunk into sitting and scoot hips toward EOB; multimodal cues for use of R UE; pt able to maintain sitting balance EOB  Transfers Overall transfer level: Needs assistance Equipment used: Rolling walker (2 wheeled);1 person hand held  assist Transfers: Sit to/from Stand Sit to Stand: Min assist;Mod assist         General transfer comment: min A to power up into standing and mod A upon standing for balance; assist to maintain R grip on RW; posterior bias  Ambulation/Gait Ambulation/Gait assistance: Mod assist Ambulation Distance (Feet): 40 Feet Assistive device: Rolling walker (2 wheeled);1 person hand held assist Gait Pattern/deviations: Narrow base of support;Step-through pattern;Decreased step length - left;Decreased step length - right Gait velocity: decreased   General Gait Details: R LE weakness and knee buckling at times; RW most of the time and attempted HHA last ~13ft; pt required assist for balance, R UE grip on RW, and management of RW around obstacles   Stairs            Wheelchair Mobility    Modified Rankin (Stroke Patients Only) Modified Rankin (Stroke Patients Only) Pre-Morbid Rankin Score: Moderate disability Modified Rankin: Severe disability     Balance Overall balance assessment: Needs assistance Sitting-balance support: Single extremity supported;Feet supported;Feet unsupported Sitting balance-Leahy Scale: Poor Sitting balance - Comments: min guard in sitting in chair, cues for upright positioning  Postural control: Posterior lean Standing balance support: Bilateral upper extremity supported Standing balance-Leahy Scale: Poor                              Cognition Arousal/Alertness: Awake/alert Behavior During Therapy: WFL for tasks assessed/performed Overall Cognitive Status: Difficult to assess Area of Impairment: Problem solving;Attention;Safety/judgement                   Current Attention Level: Selective     Safety/Judgement: Decreased awareness of deficits;Decreased awareness of safety Awareness: Intellectual Problem Solving: Decreased initiation;Requires verbal cues;Requires  tactile cues        Exercises      General Comments         Pertinent Vitals/Pain Pain Assessment: No/denies pain    Home Living                      Prior Function            PT Goals (current goals can now be found in the care plan section) Acute Rehab PT Goals PT Goal Formulation: With patient/family Time For Goal Achievement: 01/26/17 Potential to Achieve Goals: Good Progress towards PT goals: Progressing toward goals    Frequency    Min 3X/week      PT Plan Current plan remains appropriate    Co-evaluation              AM-PAC PT "6 Clicks" Daily Activity  Outcome Measure  Difficulty turning over in bed (including adjusting bedclothes, sheets and blankets)?: Total Difficulty moving from lying on back to sitting on the side of the bed? : Total Difficulty sitting down on and standing up from a chair with arms (e.g., wheelchair, bedside commode, etc,.)?: Total Help needed moving to and from a bed to chair (including a wheelchair)?: A Lot Help needed walking in hospital room?: A Lot Help needed climbing 3-5 steps with a railing? : A Lot 6 Click Score: 9    End of Session Equipment Utilized During Treatment: Gait belt Activity Tolerance: Patient tolerated treatment well Patient left: in chair;with call bell/phone within reach;with chair alarm set;with nursing/sitter in room Nurse Communication: Mobility status PT Visit Diagnosis: Hemiplegia and hemiparesis;Apraxia (R48.2);Unsteadiness on feet (R26.81);Other symptoms and signs involving the nervous system (R29.898) Hemiplegia - Right/Left: Right Hemiplegia - dominant/non-dominant: Dominant Hemiplegia - caused by: Cerebral infarction     Time: 0768-0881 PT Time Calculation (min) (ACUTE ONLY): 30 min  Charges:  $Gait Training: 8-22 mins $Therapeutic Activity: 8-22 mins                    G Codes:       Earney Navy, PTA Pager: [phone]     Darliss Cheney 01/13/2017, 2:46 PM

## 2017-01-13 NOTE — Progress Notes (Addendum)
PROGRESS NOTE    Valerie Mcclure  WLN:[id number] DOB: Dec 27, 1940 DOA: 01/11/2017 PCP: [name], MD    Brief Narrative:  Patient is a 76 year old female with history of CKD, hyperlipidemia, recent stroke L MCA in May, had mechanical thrombectomy and subsequent hemorrhagic conversion and recent diagnosis of A. fib on eliquis. Patient presented for worsening right-sided weakness and new onset aphasia.  Neurology consulted and patient is undergoing stroke work up and evaluation by Physical therapy. Currently recommending inpatient rehab. CIR consult placed.   Assessment & Plan: Stroke - Q versus early subacute infarction left caudate body extending into the periventricular white matter and within the left medial thalamus likely extension and recent stroke from large vessel disease status post left MCA mechanical temp thrombectomy in May 2018. Of note recent intracerebral hemorrhage following mechanical embolectomy has fortunately remained stable despite patient being started on eliquis. - Echocardiogram obtained and reporting EF of 60-65% with no source of embolus - Vas [place] BLE: no DVT - Neurology on board and making further evaluation recommendations - LDL 85, goal < 70  - Speech therapy evaluation - PT recommending CIR evaluation. Consult placed. Case discussed with rehab team who is waiting for reevaluation by PT today to decide whether or not to pursue consideration into inpatient rehab. Case discussed with neurology who also recommends inpatient rehab.   Hyperlipidemia - LDL 85, goal < 70. Continue statin  Principal Problem:   Aphasia as late effect of stroke Active Problems:   Acute on chronic CKD (chronic kidney disease) - Recent serum creatinines have been as low as 1.1-1.2. Serum creatinine trending down from 2.4 to 1.5. - Continue normal saline hydration and reassess BMP next a.m. - secondary to prerenal etiology from dehydration - not quite at baseline as such will  continue IVF's    Benign essential HTN -Allow for permissive hypertension but continuing beta blocker for atrial flutter controlled    Atrial flutter ([name]) - Continue beta blocker for control and prevention of subsequent strokes - Eliquis   DVT prophylaxis: Pt on Eliquis Code Status: Full Family Communication: Called all contacts on emergency contact list but was unable to reach them, straight to VM.  Addendum: updated daughter at bedside Disposition Plan: awaiting disposition and will rehydrate to improve serum creatinine levels.    Consultants:   Neurology   Procedures: Stroke work up   Antimicrobials: None   Subjective: The patient has no new complaints.   Objective: Vitals:   01/12/17 1501 01/12/17 2153 01/13/17 0455 01/13/17 1029  BP: (!) 108/56 (!) 142/92 (!) 102/41 (!) 127/58  Pulse:  83 94 90  Resp:  16 18   Temp:  98 F (36.7 C) 98 F (36.7 C)   TempSrc:  Oral Oral   SpO2:  95% 100%   Height:   5\' 3"  (1.6 m)     Intake/Output Summary (Last 24 hours) at 01/13/17 1358 Last data filed at 01/13/17 0400  Gross per 24 hour  Intake          1196.25 ml  Output              600 ml  Net           596.25 ml   There were no vitals filed for this visit.  Examination: Physical exam unchanged from yesterday's exam 01/12/17  General exam: Appears calm and comfortable , In no acute distress Respiratory system: Clear to auscultation. Respiratory effort normal. Cardiovascular system: S1 & S2 heard, no  murmurs or gallops CV strip: reporting sinus with PVC's Gastrointestinal system: Abdomen is nondistended, soft and nontender. No organomegaly or masses felt. Normal bowel sounds heard. Central nervous system: Alert and awake, aphasia, right sided weakness Extremities: Symmetric 5 x 5 power. Skin: No rashes, lesions or ulcers Psychiatry: Mood & affect appropriate.   Data Reviewed: I have personally reviewed following labs and imaging studies  CBC:  Recent  Labs Lab 01/11/17 1841  WBC 10.5  NEUTROABS 7.5  HGB 10.9*  HCT 34.7*  MCV 93.8  PLT 035   Basic Metabolic Panel:  Recent Labs Lab 01/11/17 1841 01/12/17 0705 01/13/17 0719  NA 139 139 141  K 4.8 4.1 4.2  CL 104 105 109  CO2 28 24 25   GLUCOSE 127* 129* 114*  BUN 48* 40* 34*  CREATININE 3.02* 2.46* 1.59*  CALCIUM 9.2 9.0 8.6*   GFR: Estimated Creatinine Clearance: 27.7 mL/min (A) (by C-G formula based on SCr of 1.59 mg/dL (H)). Liver Function Tests:  Recent Labs Lab 01/11/17 1841  AST 18  ALT 44  ALKPHOS 133*  BILITOT 0.4  PROT 6.3*  ALBUMIN 3.1*   No results for input(s): LIPASE, AMYLASE in the last 168 hours. No results for input(s): AMMONIA in the last 168 hours. Coagulation Profile:  Recent Labs Lab 01/11/17 1841  INR 1.54   Cardiac Enzymes: No results for input(s): CKTOTAL, CKMB, CKMBINDEX, TROPONINI in the last 168 hours. BNP (last 3 results) No results for input(s): PROBNP in the last 8760 hours. HbA1C: No results for input(s): HGBA1C in the last 72 hours. CBG: No results for input(s): GLUCAP in the last 168 hours. Lipid Profile: No results for input(s): CHOL, HDL, LDLCALC, TRIG, CHOLHDL, LDLDIRECT in the last 72 hours. Thyroid Function Tests: No results for input(s): TSH, T4TOTAL, FREET4, T3FREE, THYROIDAB in the last 72 hours. Anemia Panel: No results for input(s): VITAMINB12, FOLATE, FERRITIN, TIBC, IRON, RETICCTPCT in the last 72 hours. Sepsis Labs: No results for input(s): PROCALCITON, LATICACIDVEN in the last 168 hours.  No results found for this or any previous visit (from the past 240 hour(s)).    Radiology Studies: Ct Head Wo Contrast  Result Date: 01/11/2017 CLINICAL DATA:  Recent fall.  12/19/2016 EXAM: CT HEAD WITHOUT CONTRAST TECHNIQUE: Contiguous axial images were obtained from the base of the skull through the vertex without intravenous contrast. COMPARISON:  12/19/2016 FINDINGS: Brain: Low -density within the left basal  ganglia in the area of prior infarct with ray hemorrhagic conversion. Low-density in the anterior left temporal lobe is also in the area of prior infarct. No acute infarction or hemorrhage. No hydrocephalus. No mass effect or midline shift. Vascular: No hyperdense vessel or unexpected calcification. Skull: No acute calvarial abnormality. Sinuses/Orbits: Visualized paranasal sinuses and mastoids clear. Orbital soft tissues unremarkable. Other: None IMPRESSION: Evolutionary changes and the area of hemorrhage in the left basal ganglia and prior infarcts seen in the anterior left temporal lobe. No acute infarction or hemorrhage. Electronically Signed   By: Rolm Baptise M.D.   On: 01/11/2017 14:50   Mr Brain Wo Contrast  Result Date: 01/12/2017 CLINICAL DATA:  76 y/o F; worsening right upper and lower extremity weakness and new onset aphasia Saturday. Weakness has resolved with residual aphasia. EXAM: MRI HEAD WITHOUT CONTRAST MRA HEAD WITHOUT CONTRAST TECHNIQUE: Multiplanar, multiecho pulse sequences of the brain and surrounding structures were obtained without intravenous contrast. Angiographic images of the head were obtained using MRA technique without contrast. COMPARISON:  01/11/2017 CT head.  11/30/2016 MRI  of the brain. FINDINGS: MRI HEAD FINDINGS Brain: There is mildly reduced diffusion within the left caudate body extending in the periventricular white matter and within the left medial thalamus with increased distribution in comparison with areas of infection seen on the prior MRI of the brain. There is mild associated T2 FLAIR hyperintense signal abnormality. Findings are compatible with interval acute to early subacute infarction. Hematoma within the left basal ganglia is decreased in size from the prior MRI of the brain and demonstrates increased T1/T2 signal compatible with late subacute hemorrhage. T2 FLAIR hyperintense signal abnormality with pons likely representing microvascular ischemic changes.  Left anterior temporal encephalomalacia corresponding to region of infarction on the prior MRI of the brain. The area of infarction demonstrates hemosiderin staining. Additionally, there is hemosiderin staining along the margins of the left lateral ventricle frontal horn. No hydrocephalus, herniation, or evidence for interval hemorrhage. Vascular: As below. Skull and upper cervical spine: Normal marrow signal. Sinuses/Orbits: Negative. Other: None. MRA HEAD FINDINGS Internal carotid arteries:  Patent. Anterior cerebral arteries:  Patent. Middle cerebral arteries: Patent. Anterior communicating artery: Patent. Posterior communicating arteries: Fetal left PCA. No right posterior communicating artery identified, likely hypoplastic or absent. Posterior cerebral arteries:  Patent. Basilar artery:  Patent. Vertebral arteries:  Patent. No evidence of high-grade stenosis, large vessel occlusion, or aneurysm unless noted above. IMPRESSION: 1. Reduced diffusion within left caudate body extending in the periventricular white matter and within the left medial thalamus increased in distribution in comparison with prior MRI of the brain. Findings are consistent with interval acute/early subacute infarction. 2. Decreased size of late subacute hematoma within the left basal ganglia. Left lateral ventricle hemosiderin staining. 3. Left anterior temporal lobe encephalomalacia from prior infarction. 4. Patent circle of Willis. No large vessel occlusion, aneurysm, or significant stenosis is identified. Electronically Signed   By: Kristine Garbe M.D.   On: 01/12/2017 02:50   Mr Jodene Nam Head/brain HW Cm  Result Date: 01/12/2017 CLINICAL DATA:  76 y/o F; worsening right upper and lower extremity weakness and new onset aphasia Saturday. Weakness has resolved with residual aphasia. EXAM: MRI HEAD WITHOUT CONTRAST MRA HEAD WITHOUT CONTRAST TECHNIQUE: Multiplanar, multiecho pulse sequences of the brain and surrounding structures  were obtained without intravenous contrast. Angiographic images of the head were obtained using MRA technique without contrast. COMPARISON:  01/11/2017 CT head.  11/30/2016 MRI of the brain. FINDINGS: MRI HEAD FINDINGS Brain: There is mildly reduced diffusion within the left caudate body extending in the periventricular white matter and within the left medial thalamus with increased distribution in comparison with areas of infection seen on the prior MRI of the brain. There is mild associated T2 FLAIR hyperintense signal abnormality. Findings are compatible with interval acute to early subacute infarction. Hematoma within the left basal ganglia is decreased in size from the prior MRI of the brain and demonstrates increased T1/T2 signal compatible with late subacute hemorrhage. T2 FLAIR hyperintense signal abnormality with pons likely representing microvascular ischemic changes. Left anterior temporal encephalomalacia corresponding to region of infarction on the prior MRI of the brain. The area of infarction demonstrates hemosiderin staining. Additionally, there is hemosiderin staining along the margins of the left lateral ventricle frontal horn. No hydrocephalus, herniation, or evidence for interval hemorrhage. Vascular: As below. Skull and upper cervical spine: Normal marrow signal. Sinuses/Orbits: Negative. Other: None. MRA HEAD FINDINGS Internal carotid arteries:  Patent. Anterior cerebral arteries:  Patent. Middle cerebral arteries: Patent. Anterior communicating artery: Patent. Posterior communicating arteries: Fetal left PCA. No right  posterior communicating artery identified, likely hypoplastic or absent. Posterior cerebral arteries:  Patent. Basilar artery:  Patent. Vertebral arteries:  Patent. No evidence of high-grade stenosis, large vessel occlusion, or aneurysm unless noted above. IMPRESSION: 1. Reduced diffusion within left caudate body extending in the periventricular white matter and within the left  medial thalamus increased in distribution in comparison with prior MRI of the brain. Findings are consistent with interval acute/early subacute infarction. 2. Decreased size of late subacute hematoma within the left basal ganglia. Left lateral ventricle hemosiderin staining. 3. Left anterior temporal lobe encephalomalacia from prior infarction. 4. Patent circle of Willis. No large vessel occlusion, aneurysm, or significant stenosis is identified. Electronically Signed   By: Kristine Garbe M.D.   On: 01/12/2017 02:50    Scheduled Meds: .  stroke: mapping our early stages of recovery book   Does not apply Once  . apixaban  5 mg Oral BID  . chlorhexidine  15 mL Mouth Rinse BID  . FLUoxetine  20 mg Oral Daily  . levothyroxine  88 mcg Oral QAC breakfast  . mouth rinse  15 mL Mouth Rinse q12n4p  . metoprolol tartrate  25 mg Oral BID  . nortriptyline  20 mg Oral QHS  . simvastatin  10 mg Oral q1800   Continuous Infusions: . sodium chloride 75 mL/hr at 01/13/17 0640  . lactated ringers    . lactated ringers       LOS: 1 day    Time spent: > 35 minutes  Velvet Bathe, MD [facility] Pager [phone]  If 7PM-7AM, please contact night-coverage [email or web address] Password [email or web address] 01/13/2017, 1:58 PM

## 2017-01-13 NOTE — Progress Notes (Signed)
CSW received consult regarding PT recommendation of SNF at discharge.  Patient's daughter and spouse are refusing SNF. They would like to pursue inpatient rehab admission. If not, they would like home health.  CSW signing off.   Percell Locus Alaiza Yau LCSWA [phone]

## 2017-01-13 NOTE — Progress Notes (Signed)
STROKE TEAM PROGRESS NOTE   HISTORY OF PRESENT ILLNESS (per record) Real Valerie Mcclure a 76 y.o.femalewith history of chronic abdominal and LBP, CKD, hyperlipidemia who fell out of bed on 5/13 am and was found to have left facial weakness with slurred speech and right sided weakness.  CT Head showed hyperdense L-MCA sign with left M1 embolic occlusion.  CT angio with complete revascularization of L-MCA with reperfusion with hemorrhagic transformation of left temporal infarct and minimal SDH.  MRI brain showed L-MCA infarct with diffusion restriction left temporal lobe, superimposed 5.5 cm intra-axial hemorrhage centered at left basal ganglia with associated left hemisphere edema, effaced left lateral ventricle with trace IVH and occasional small foci of restriction in left thalamus and anterior left occipital lobe.  She was diagnosed with new-onset atrial fibrillation and started on Eliquis.    She presents back to the ED on 01/11/2017 with worsened right upper and lower extremity weakness with new onset aphasia on Saturday. The weakness has resolved back to her baseline of post-stroke right sided weakness, but the aphasia continues. She was just discharged from rehab on Thursday. She was receiving her Eliquis in rehab but did not receive her doses on Friday or Saturday due to troubles either with finances or with family ability to get to the pharmacy to have the prescriptions filled, according to her daughters, who are unable to specify the circumstances in more detail. She was restarted on Eliquis on Sunday after the prescription was filled. Daughters state that the situation in the patient's home is chaotic, with illicit drug use taking place and an overdose on heroin by one family member recently; there are no other family members present to corroborate this.   Patient was not administered IV t-PA secondary to recent T[place]. She was admitted to General Neurology for further evaluation and  treatment.   SUBJECTIVE (INTERVAL HISTORY) Her family is not at the bedside. Patient is better but has not returned back to her baseline   OBJECTIVE Temp:  [98 F (36.7 C)] 98 F (36.7 C) (06/27 0455) Pulse Rate:  [83-94] 90 (06/27 1029) Cardiac Rhythm: Heart block (06/27 0707) Resp:  [16-18] 18 (06/27 0455) BP: (102-142)/(41-92) 127/58 (06/27 1029) SpO2:  [95 %-100 %] 100 % (06/27 0455) Weight:  [176 lb (79.8 kg)] 176 lb (79.8 kg) (06/27 1554)  CBC:   Recent Labs Lab 01/11/17 1841  WBC 10.5  NEUTROABS 7.5  HGB 10.9*  HCT 34.7*  MCV 93.8  PLT 277    Basic Metabolic Panel:   Recent Labs Lab 01/12/17 0705 01/13/17 0719  NA 139 141  K 4.1 4.2  CL 105 109  CO2 24 25  GLUCOSE 129* 114*  BUN 40* 34*  CREATININE 2.46* 1.59*  CALCIUM 9.0 8.6*    Lipid Panel:     Component Value Date/Time   CHOL 162 11/30/2016 0329   TRIG 175 (H) 11/30/2016 0329   HDL 42 11/30/2016 0329   CHOLHDL 3.9 11/30/2016 0329   VLDL 35 11/30/2016 0329   LDLCALC 85 11/30/2016 0329   HgbA1c:  Lab Results  Component Value Date   HGBA1C 5.8 (H) 11/30/2016   Urine Drug Screen:     Component Value Date/Time   LABOPIA POSITIVE (A) 01/12/2017 0053   COCAINSCRNUR NONE DETECTED 01/12/2017 0053   LABBENZ NONE DETECTED 01/12/2017 0053   AMPHETMU NONE DETECTED 01/12/2017 0053   THCU NONE DETECTED 01/12/2017 0053   LABBARB NONE DETECTED 01/12/2017 0053    Alcohol Level No results found for:  ETH  IMAGING  Ct Head Wo Contrast 01/11/2017 IMPRESSION: Evolutionary changes and the area of hemorrhage in the left basal ganglia and prior infarcts seen in the anterior left temporal lobe. No acute infarction or hemorrhage.  Mr Brain 69 Contrast Mr Valerie Mcclure Head/brain Wo Cm 01/12/2017 IMPRESSION: 1. Reduced diffusion within left caudate body extending in the periventricular white matter and within the left medial thalamus increased in distribution in comparison with prior MRI of the brain. Findings are  consistent with interval acute/early subacute infarction. 2. Decreased size of late subacute hematoma within the left basal ganglia. Left lateral ventricle hemosiderin staining. 3. Left anterior temporal lobe encephalomalacia from prior infarction. 4. Patent circle of Willis. No large vessel occlusion, aneurysm, or significant stenosis is identified.      PHYSICAL EXAM Frail elderly Caucasian lady currently not in distress. . Afebrile. Head is nontraumatic. Neck is supple without bruit.    Cardiac exam no murmur or gallop. Lungs are clear to auscultation. Distal pulses are well felt. Neurological Exam :  Awake alert moderate expressive aphasia as well as dysarthria. Can speak short sentences and simple words. Does have significant word hesitation and aphasic errors. Difficulty with naming and repetition. Comprehension appears better preserved. Extraocular movements appear full range but there is some left gaze preference. Decreased blink to threat on the right compared to the left. Pupils equal reactive. Vision acuity seems adequate. Fundi were not visualized. Right lower facial weakness. Tongue midline. Hearing is intact. Motor system exam shows right hemiplegia with 3/5 right upper extremity and 4/5 right lower extremity strength with significant weakness of right grip and intrinsic hand muscles and right ankle dorsiflexors. Normal strength on the left. Tone is increased on the right side. The tendon reflexes are brisker on the right compared to the left. Lantus upgoing on the right and downgoing on the left. Sensation appears preserved bilaterally. Gait was not tested. ASSESSMENT/PLAN [name] is a 76 y.o. female with history of chronic abdominal and LBP, CKD, hyperlipidemia presenting 01/11/2017 with worsened right upper and lower extremity weakness with new onset aphasia after revascularization of L-MCA with hemorrhagic transformation on 11/30/2016. She did not receive IV t-PA due to  recent ICH.    Stroke:  acute/early subacute infarction of left caudate body extending in the periventricular white matter and within the left medial thalamus likely extension of recent stroke from large vessel disease status post left MCA mechanical temp thrombectomy in May 2018. Recent intracerebral hemorrhage following mechanical embolectomy has fortunately remained stable despite being started on eliquis  CT head: Evolutionary changes and the area of hemorrhage in the left basal ganglia and prior infarcts seen in the anterior left temporal lobe  MRI head: acute/early subacute infarction ofl eft caudate body extending in the periventricular white matter and within the left medial thalamus  MRA head: Patent circle of Willis. No large vessel occlusion, aneurysm, or significant stenosis is identified.   2D Echo: EF 60-65%. No source of embolus  Vas [place] BLE: no DVT   LDL 85  HgbA1c 5.8  SCDs for VTE prophylaxis DIET DYS 2 Room service appropriate? Yes; Fluid consistency: Nectar Thick  Eliquis (apixaban) daily prior to admission, now on Eliquis (apixaban) daily  Patient counseled to be compliant with her antithrombotic medications  Ongoing aggressive stroke risk factor management  Therapy recommendations: CIR;Supervision for mobility/OOB  Disposition:  Pending   Hyperlipidemia  Home meds: none,   LDL 85, goal < 70  Continue statin  Other Stroke Risk  Factors  Advanced age  Hx stroke/TIA  Opioid abuse  Other Active Problems  None  Hospital day # 1  I have personally examined this patient, reviewed notes, independently viewed imaging studies, participated in medical decision making and plan of care.ROS completed by me personally and pertinent positives fully documented  I have made any additions or clarifications directly to the above note. She had recent left middle cerebral artery infarct in May 2018 and underwent mechanical embolectomy and had shown moderate  improvement with mild residual expressive aphasia and right hemiparesis that has had recent clinical worsening likely due to subacute extension of recent infarct. Fortunately the patient's recent intracerebral hemorrhage has not extended despite being on eliquis. MRA however shows patent left middle cerebral artery. Patient does have  atrial fibrillation and was on eliquis. I had a long discussion the patient and husband regarding lack of definite clinical data suggesting switching eliquis to alternative anticoagulant being any better. After discussion patient and husband agreed to stay on eliquis now. Recommend physical occupational therapy and rehabilitation consults. No need for extensive stroke workup given patient's recent admission for the same problem.  Await rehab decision. D/w Dr Wendee Beavers Antony Contras, MD Medical Director [place] Pager: [phone] 01/13/2017 3:58 PM   To contact Stroke Continuity provider, please refer to [email or web address] After hours, contact General Neurology

## 2017-01-14 ENCOUNTER — Inpatient Hospital Stay (HOSPITAL_COMMUNITY)
Admission: RE | Admit: 2017-01-14 | Discharge: 2017-01-19 | DRG: 092 | Disposition: A | Discharge status: Home Health | Payer: Medicare Other | Source: Intra-hospital | Attending: Physical Medicine & Rehabilitation | Admitting: Physical Medicine & Rehabilitation

## 2017-01-14 ENCOUNTER — Encounter: Payer: Medicare Other | Admitting: Physical Medicine & Rehabilitation

## 2017-01-14 DIAGNOSIS — E890 Postprocedural hypothyroidism: Secondary | ICD-10-CM | POA: Diagnosis present

## 2017-01-14 DIAGNOSIS — I69393 Ataxia following cerebral infarction: Secondary | ICD-10-CM

## 2017-01-14 DIAGNOSIS — Z880 Allergy status to penicillin: Secondary | ICD-10-CM | POA: Diagnosis not present

## 2017-01-14 DIAGNOSIS — G8929 Other chronic pain: Secondary | ICD-10-CM | POA: Diagnosis not present

## 2017-01-14 DIAGNOSIS — E876 Hypokalemia: Secondary | ICD-10-CM | POA: Diagnosis present

## 2017-01-14 DIAGNOSIS — Z79899 Other long term (current) drug therapy: Secondary | ICD-10-CM

## 2017-01-14 DIAGNOSIS — W19XXXA Unspecified fall, initial encounter: Secondary | ICD-10-CM | POA: Diagnosis not present

## 2017-01-14 DIAGNOSIS — Z90710 Acquired absence of both cervix and uterus: Secondary | ICD-10-CM | POA: Diagnosis not present

## 2017-01-14 DIAGNOSIS — Z7901 Long term (current) use of anticoagulants: Secondary | ICD-10-CM

## 2017-01-14 DIAGNOSIS — N183 Chronic kidney disease, stage 3 (moderate): Secondary | ICD-10-CM | POA: Diagnosis present

## 2017-01-14 DIAGNOSIS — I69392 Facial weakness following cerebral infarction: Secondary | ICD-10-CM

## 2017-01-14 DIAGNOSIS — I69320 Aphasia following cerebral infarction: Secondary | ICD-10-CM | POA: Diagnosis not present

## 2017-01-14 DIAGNOSIS — R1310 Dysphagia, unspecified: Secondary | ICD-10-CM | POA: Diagnosis not present

## 2017-01-14 DIAGNOSIS — I69351 Hemiplegia and hemiparesis following cerebral infarction affecting right dominant side: Secondary | ICD-10-CM | POA: Diagnosis not present

## 2017-01-14 DIAGNOSIS — I69390 Apraxia following cerebral infarction: Secondary | ICD-10-CM | POA: Diagnosis not present

## 2017-01-14 DIAGNOSIS — E8809 Other disorders of plasma-protein metabolism, not elsewhere classified: Secondary | ICD-10-CM | POA: Diagnosis not present

## 2017-01-14 DIAGNOSIS — R739 Hyperglycemia, unspecified: Secondary | ICD-10-CM | POA: Diagnosis present

## 2017-01-14 DIAGNOSIS — E785 Hyperlipidemia, unspecified: Secondary | ICD-10-CM

## 2017-01-14 DIAGNOSIS — I69391 Dysphagia following cerebral infarction: Secondary | ICD-10-CM | POA: Diagnosis not present

## 2017-01-14 DIAGNOSIS — I129 Hypertensive chronic kidney disease with stage 1 through stage 4 chronic kidney disease, or unspecified chronic kidney disease: Secondary | ICD-10-CM | POA: Diagnosis present

## 2017-01-14 DIAGNOSIS — Z888 Allergy status to other drugs, medicaments and biological substances status: Secondary | ICD-10-CM | POA: Diagnosis not present

## 2017-01-14 DIAGNOSIS — G8191 Hemiplegia, unspecified affecting right dominant side: Secondary | ICD-10-CM | POA: Diagnosis not present

## 2017-01-14 DIAGNOSIS — D62 Acute posthemorrhagic anemia: Secondary | ICD-10-CM | POA: Diagnosis present

## 2017-01-14 DIAGNOSIS — Z79891 Long term (current) use of opiate analgesic: Secondary | ICD-10-CM

## 2017-01-14 DIAGNOSIS — Z91013 Allergy to seafood: Secondary | ICD-10-CM

## 2017-01-14 DIAGNOSIS — I63512 Cerebral infarction due to unspecified occlusion or stenosis of left middle cerebral artery: Secondary | ICD-10-CM | POA: Diagnosis present

## 2017-01-14 DIAGNOSIS — R2689 Other abnormalities of gait and mobility: Secondary | ICD-10-CM | POA: Diagnosis not present

## 2017-01-14 DIAGNOSIS — E46 Unspecified protein-calorie malnutrition: Secondary | ICD-10-CM | POA: Diagnosis not present

## 2017-01-14 DIAGNOSIS — M545 Low back pain: Secondary | ICD-10-CM | POA: Diagnosis present

## 2017-01-14 DIAGNOSIS — Z8249 Family history of ischemic heart disease and other diseases of the circulatory system: Secondary | ICD-10-CM | POA: Diagnosis not present

## 2017-01-14 DIAGNOSIS — Z9049 Acquired absence of other specified parts of digestive tract: Secondary | ICD-10-CM

## 2017-01-14 DIAGNOSIS — Z91041 Radiographic dye allergy status: Secondary | ICD-10-CM

## 2017-01-14 DIAGNOSIS — R5381 Other malaise: Secondary | ICD-10-CM

## 2017-01-14 DIAGNOSIS — I10 Essential (primary) hypertension: Secondary | ICD-10-CM | POA: Diagnosis not present

## 2017-01-14 LAB — BASIC METABOLIC PANEL
ANION GAP: 7 (ref 5–15)
BUN: 21 mg/dL — ABNORMAL HIGH (ref 6–20)
CALCIUM: 8.6 mg/dL — AB (ref 8.9–10.3)
CO2: 22 mmol/L (ref 22–32)
Chloride: 113 mmol/L — ABNORMAL HIGH (ref 101–111)
Creatinine, Ser: 1.25 mg/dL — ABNORMAL HIGH (ref 0.44–1.00)
GFR, EST AFRICAN AMERICAN: 47 mL/min — AB (ref >60)
GFR, EST NON AFRICAN AMERICAN: 40 mL/min — AB (ref >60)
Glucose, Bld: 104 mg/dL — ABNORMAL HIGH (ref 65–99)
Potassium: 3.9 mmol/L (ref 3.5–5.1)
SODIUM: 142 mmol/L (ref 135–145)

## 2017-01-14 MED ORDER — PROCHLORPERAZINE MALEATE 5 MG PO TABS: 5.0000 mg | ORAL_TABLET | Freq: Four times a day (QID) | ORAL | Status: DC | PRN

## 2017-01-14 MED ORDER — LEVOTHYROXINE SODIUM 88 MCG PO TABS
88.0000 ug | ORAL_TABLET | Freq: Every day | ORAL | Status: DC
  Administered 2017-01-15 – 2017-01-19 (×5): 88 ug via ORAL
  Filled 2017-01-14 (×5): qty 1

## 2017-01-14 MED ORDER — DIPHENHYDRAMINE HCL 12.5 MG/5ML PO ELIX
12.5000 mg | ORAL_SOLUTION | Freq: Four times a day (QID) | ORAL | Status: DC | PRN
  Administered 2017-01-17 – 2017-01-18 (×2): 25 mg via ORAL
  Filled 2017-01-14 (×2): qty 10

## 2017-01-14 MED ORDER — ALUM & MAG HYDROXIDE-SIMETH 200-200-20 MG/5ML PO SUSP
30.0000 mL | ORAL | Status: DC | PRN
  Administered 2017-01-15: 30 mL via ORAL
  Filled 2017-01-14: qty 30

## 2017-01-14 MED ORDER — ORAL CARE MOUTH RINSE
15.0000 mL | Freq: Two times a day (BID) | OROMUCOSAL | Status: DC
  Administered 2017-01-15 – 2017-01-19 (×9): 15 mL via OROMUCOSAL

## 2017-01-14 MED ORDER — SENNOSIDES-DOCUSATE SODIUM 8.6-50 MG PO TABS: 1.0000 | ORAL_TABLET | Freq: Every evening | ORAL | Status: DC | PRN

## 2017-01-14 MED ORDER — ACETAMINOPHEN 325 MG PO TABS
325.0000 mg | ORAL_TABLET | ORAL | Status: DC | PRN
  Administered 2017-01-18 (×2): 650 mg via ORAL
  Filled 2017-01-14 (×2): qty 2

## 2017-01-14 MED ORDER — SODIUM CHLORIDE 0.9 % IV SOLN
INTRAVENOUS | Status: DC
  Administered 2017-01-15: 18:00:00 via INTRAVENOUS
  Filled 2017-01-14: qty 1000

## 2017-01-14 MED ORDER — VALSARTAN 80 MG PO TABS: 160.0000 mg | ORAL_TABLET | Freq: Every day | ORAL | Status: DC

## 2017-01-14 MED ORDER — METOPROLOL TARTRATE 25 MG PO TABS
25.0000 mg | ORAL_TABLET | Freq: Two times a day (BID) | ORAL | Status: DC
  Administered 2017-01-14 – 2017-01-19 (×10): 25 mg via ORAL
  Filled 2017-01-14 (×10): qty 1

## 2017-01-14 MED ORDER — POTASSIUM CHLORIDE CRYS ER 20 MEQ PO TBCR: 10.0000 meq | EXTENDED_RELEASE_TABLET | Freq: Every day | ORAL | 0 refills | Status: DC

## 2017-01-14 MED ORDER — APIXABAN 5 MG PO TABS
5.0000 mg | ORAL_TABLET | Freq: Two times a day (BID) | ORAL | Status: DC
  Administered 2017-01-14 – 2017-01-19 (×10): 5 mg via ORAL
  Filled 2017-01-14 (×10): qty 1

## 2017-01-14 MED ORDER — CHLORHEXIDINE GLUCONATE 0.12 % MT SOLN
15.0000 mL | Freq: Two times a day (BID) | OROMUCOSAL | Status: DC
  Administered 2017-01-14 – 2017-01-19 (×10): 15 mL via OROMUCOSAL
  Filled 2017-01-14 (×10): qty 15

## 2017-01-14 MED ORDER — BISACODYL 10 MG RE SUPP: 10.0000 mg | Freq: Every day | RECTAL | Status: DC | PRN

## 2017-01-14 MED ORDER — PROCHLORPERAZINE EDISYLATE 5 MG/ML IJ SOLN: 5.0000 mg | Freq: Four times a day (QID) | INTRAMUSCULAR | Status: DC | PRN

## 2017-01-14 MED ORDER — SIMVASTATIN 5 MG PO TABS
10.0000 mg | ORAL_TABLET | Freq: Every day | ORAL | Status: DC
  Administered 2017-01-15 – 2017-01-18 (×4): 10 mg via ORAL
  Filled 2017-01-14 (×4): qty 2

## 2017-01-14 MED ORDER — NORTRIPTYLINE HCL 10 MG PO CAPS
20.0000 mg | ORAL_CAPSULE | Freq: Every day | ORAL | Status: DC
  Administered 2017-01-14 – 2017-01-18 (×5): 20 mg via ORAL
  Filled 2017-01-14 (×5): qty 2

## 2017-01-14 MED ORDER — FLUOXETINE HCL 20 MG PO CAPS
20.0000 mg | ORAL_CAPSULE | Freq: Every day | ORAL | Status: DC
  Administered 2017-01-15 – 2017-01-19 (×5): 20 mg via ORAL
  Filled 2017-01-14 (×5): qty 1

## 2017-01-14 MED ORDER — TRAZODONE HCL 50 MG PO TABS
25.0000 mg | ORAL_TABLET | Freq: Every evening | ORAL | Status: DC | PRN
  Administered 2017-01-14 – 2017-01-15 (×2): 50 mg via ORAL
  Filled 2017-01-14 (×2): qty 1

## 2017-01-14 MED ORDER — RESOURCE THICKENUP CLEAR PO POWD
ORAL | Status: DC | PRN
  Filled 2017-01-14: qty 125

## 2017-01-14 MED ORDER — STROKE: EARLY STAGES OF RECOVERY BOOK: 1.0000 | Freq: Once | Status: DC

## 2017-01-14 MED ORDER — FUROSEMIDE 20 MG PO TABS: 20.0000 mg | ORAL_TABLET | ORAL | Status: DC

## 2017-01-14 MED ORDER — RESOURCE THICKENUP CLEAR PO POWD: ORAL | Status: DC

## 2017-01-14 MED ORDER — GUAIFENESIN-DM 100-10 MG/5ML PO SYRP: 5.0000 mL | ORAL_SOLUTION | Freq: Four times a day (QID) | ORAL | Status: DC | PRN

## 2017-01-14 MED ORDER — FLEET ENEMA 7-19 GM/118ML RE ENEM: 1.0000 | ENEMA | Freq: Once | RECTAL | Status: DC | PRN

## 2017-01-14 MED ORDER — PROCHLORPERAZINE 25 MG RE SUPP: 12.5000 mg | Freq: Four times a day (QID) | RECTAL | Status: DC | PRN

## 2017-01-14 NOTE — Progress Notes (Signed)
Physical Therapy Treatment Patient Details Name: Valerie Mcclure MRN: [id number] DOB: 07-18-1941 Today's Date: 01/14/2017    History of Present Illness Valerie Darrow [name] a 76 y.o.femalewith medical history significant of recent L MCA stroke in May, had mechanical thrombectomy and subsequent hemorrhagic conversion, readmission from rehab for opiate overdose and discharge to SNF, A.Fib. Admitted 6/25 with worsened right sided weakness and new onset aphasia. MRI findings are consistent with interval acute/early subacute infarction with reduced diffusion within left caudate body extending in the periventricular white matter and within the left medial thalamus, increased in distribution in comparison with prior MRI of the brain. PMH significant for recent L MCA stroke in May, chronic low back pain, hyperlipidemia, osetopenia, unspecified essential hypertension, unspecified hypothyroidism.    PT Comments    Pt able to increase ambulation distance with decreased R knee buckeling.  Ataxia noted with target training with therex.  Pt is a good candidate for CIR.   Follow Up Recommendations  CIR;Supervision for mobility/OOB     Equipment Recommendations  Other (comment) (TBD)    Recommendations for Other Services       Precautions / Restrictions Precautions Precautions: Fall Precaution Comments: R sided weakness, R inattention Restrictions Weight Bearing Restrictions: No    Mobility  Bed Mobility               General bed mobility comments: up in recliner upon arrival  Transfers Overall transfer level: Needs assistance Equipment used: Rolling walker (2 wheeled) Transfers: Sit to/from Stand Sit to Stand: Min assist         General transfer comment: MIN A to power up from recliner with tendency to rely on posterior legs with transfer.  Multiple reps with cues for hand placement and forward weight shift  Ambulation/Gait Ambulation/Gait assistance: Min assist;Mod  assist Ambulation Distance (Feet): 80 Feet Assistive device: Rolling walker (2 wheeled) Gait Pattern/deviations: Narrow base of support;Decreased step length - right;Decreased step length - left;Trendelenburg Gait velocity: decreased   General Gait Details: No R LE buckeling today, but weakness noted in R LE throughout gait.  A to maintain R hand on RW.  Son followed behind with recliner.   Stairs            Wheelchair Mobility    Modified Rankin (Stroke Patients Only) Modified Rankin (Stroke Patients Only) Pre-Morbid Rankin Score: Moderate disability Modified Rankin: Severe disability     Balance Overall balance assessment: Needs assistance         Standing balance support: Bilateral upper extremity supported Standing balance-Leahy Scale: Poor                              Cognition Arousal/Alertness: Awake/alert Behavior During Therapy: WFL for tasks assessed/performed   Area of Impairment: Problem solving;Attention;Safety/judgement                   Current Attention Level: Selective   Following Commands: Follows one step commands consistently;Follows multi-step commands with increased time Safety/Judgement: Decreased awareness of deficits;Decreased awareness of safety Awareness: Intellectual Problem Solving: Decreased initiation;Requires verbal cues;Requires tactile cues        Exercises General Exercises - Lower Extremity Ankle Circles/Pumps: AROM;AAROM;Right;10 reps Long Arc Quad: AROM;Strengthening;Other (comment) (with target training) Hip ABduction/ADduction: AROM;Both;10 reps    General Comments        Pertinent Vitals/Pain Pain Assessment: No/denies pain    Home Living  Prior Function            PT Goals (current goals can now be found in the care plan section) Acute Rehab PT Goals Patient Stated Goal: CIR before home PT Goal Formulation: With patient/family Time For Goal Achievement:  01/26/17 Potential to Achieve Goals: Good Progress towards PT goals: Progressing toward goals    Frequency    Min 3X/week      PT Plan Current plan remains appropriate    Co-evaluation              AM-PAC PT "6 Clicks" Daily Activity  Outcome Measure  Difficulty turning over in bed (including adjusting bedclothes, sheets and blankets)?: Total Difficulty moving from lying on back to sitting on the side of the bed? : Total Difficulty sitting down on and standing up from a chair with arms (e.g., wheelchair, bedside commode, etc,.)?: Total Help needed moving to and from a bed to chair (including a wheelchair)?: A Little Help needed walking in hospital room?: A Lot Help needed climbing 3-5 steps with a railing? : A Lot 6 Click Score: 10    End of Session Equipment Utilized During Treatment: Gait belt Activity Tolerance: Patient tolerated treatment well Patient left: in chair;with call bell/phone within reach   PT Visit Diagnosis: Hemiplegia and hemiparesis;Apraxia (R48.2);Unsteadiness on feet (R26.81);Other symptoms and signs involving the nervous system (R29.898) Hemiplegia - Right/Left: Right Hemiplegia - dominant/non-dominant: Dominant Hemiplegia - caused by: Cerebral infarction     Time: 1111-1135 PT Time Calculation (min) (ACUTE ONLY): 24 min  Charges:  $Gait Training: 8-22 mins $Therapeutic Exercise: 8-22 mins                    G Codes:       Dante Cooter L. Tamala Julian, [place] Pager [phone] 01/14/2017     Galen Manila 01/14/2017, 1:15 PM

## 2017-01-14 NOTE — Consult Note (Signed)
   [facility] Inpatient Consult   01/14/2017  [name] 06/02/1941 [id number]   Patient assessed for re-admission in the [place]. Primary Care Provider noted at Dr. Billey Gosling.  Chart reviewed and found per progress notes the patient is Valerie Mcclure a 76 y.o.femalewith history of chronic abdominal and LBP, CKD, hyperlipidemia who fell out of bed  and was found to have left facial weakness with slurred speech and right sided weakness.  CT Head showed hyperdense L-MCA sign with left M1 embolic occlusion.  Patient and family are currently pursuing inpatient rehabilitation at [facility] facility awaiting insurance authorization. Came to speak with inpatient RNCM but not currently available. No [facility] Care Management needs noted for transition to rehab.  Please contact, for questions:  Natividad Brood, RN BSN [facility] Liaison  [phone] business mobile phone Toll free office [phone]

## 2017-01-14 NOTE — Discharge Summary (Signed)
Physician Discharge Summary  SRESHTA CRESSLER XBJ:[id number] DOB: Oct 08, 1940 DOA: 01/11/2017  PCP: [name], MD  Admit date: 01/11/2017 Discharge date: 01/14/2017  Time spent: 35 minutes  Recommendations for Outpatient Follow-up:  Please follow BMET in 2-3 days to follow electrolytes and renal function  Follow BP and adjust medications as needed Repeat CBC in 5 days to follow Hgb trend    Discharge Diagnoses:  Principal Problem:   Aphasia as late effect of stroke Active Problems:   CKD (chronic kidney disease)   Benign essential HTN   Atrial flutter ([name])   Acute kidney failure ([place])   Aphasia as late effect of cerebrovascular accident (CVA)   AKI (acute kidney injury) ([place])   PAF (paroxysmal atrial fibrillation) ([place])   Physical deconditioning   Discharge Condition: stable and improved. Discharge to inpatient rehab unit for further care and rehabilitation. Follow up with PCP in 2 weeks after discharge.  Diet recommendation: heart healthy diet   Filed Weights   01/13/17 1554  Weight: 79.8 kg (176 lb)    Brief History of present illness:  As per H&P written by Dr. Alcario Drought written on 01/11/17 76 y.o. female with medical history significant of recent L MCA stroke in May, had mechanical thrombectomy and subsequent hemorrhagic conversion.  Re-admitted for opiate overdose and discharge to SNF.  During second admit was diagnosed with A.Fib and put on eliquis.  Improved in SNF and discharged home on Thursday last week.  Hospital Course:  1-stroke -acute/subacute infarction of left caudate body extending in the periventricular white matter and within left medial thalamus (likely extension of recent stroke from left MCA). Recent ICH following mechanical embolectomy has remained stable despite use of eliquis. -2-D echo w/o source of emboli and preserved EF -MRA head with patent circle of willis  -vas [place] BLE: no DVT -per neurology rec's continue use of eliquis for secondary  prevention and continue risk factor modifications -patient discharge to CIR for rehabilitation and further care prior to be discharge home   2-dysphagia: residual deficit from stroke -continue dysphagia 3 diet and nectar thick liquids -SPL to follow patient at CIR  3-HLD -continue statins   4-hypothyroidism -will continue synthroid   5-HTN: -stable overall -will resume home antihypertensive regimen with adjustments -follow up BP and adjust further her regimen as needed   6-acute on chronic renal failure: stage 3 at baseline -due to dehydration and continue use of nephrotoxic agents -back to baseline at discharge -will adjust dose of lasix and ARB -follow good hydration and continue monitoring renal function trend   7-atrial flutter -will continue B-blocker for rate control and continue the use of eliquis for anticoagulation therapy.  8-anemia of chronic disease: -due to renal failure -Hgb in 10.0 range -follow trend  -no bleeding appreciated and no transfusion needed   9-physical deconditioning and right side hemiparesis/dysarthria: residual effects from stroke -patient discharge to CIR for further care and rehabilitation.   Procedures:  See below for x-ray reports   LE duplex: no DVT  Consultations:  Neurology   CIR   Discharge Exam: Vitals:   01/14/17 0757 01/14/17 1255  BP: 128/66 137/75  Pulse: 79 68  Resp: 20 18  Temp: 98.6 F (37 C) 97.8 F (36.6 C)    General: afebrile, no CP and denies SOB. Patient with positive aphasia and dysarthria. No acute distress and denies nausea and HDMode.be facial droop and right side hemiparesis  Cardiovascular: rate controlled, no rubs, no gallops, no murmurs Respiratory: good air  movement bilaterally, no wheezing, no crackles Abd: soft, NT, ND, positive BS Extremities: no edema, no cyanosis Neurologic exam: positive dysarthria and expressive aphasia; patient with right hemiparesis (MS 3/5 upper limb and  4/5lower extremity), normal strength on her left side. Positive left gaze and mild right facial weakness/droop.  Discharge Instructions   Discharge Instructions    Diet - low sodium heart healthy    Complete by:  As directed    Discharge instructions    Complete by:  As directed    Follow heart healthy diet  Maintain adequate hydration  Take medications as prescribed  Please follow BMET in 2-3 days to follow electrolytes and renal function  Follow BP and adjust medications as needed     Current Discharge Medication List    START taking these medications   Details   stroke: mapping our early stages of recovery book MISC 1 each by Does not apply route once.    Maltodextrin-Xanthan Gum (RESOURCE THICKENUP CLEAR) POWD Use as needed to get liquid to nectar thick consistency      CONTINUE these medications which have CHANGED   Details  furosemide (LASIX) 20 MG tablet Take 1 tablet (20 mg total) by mouth every other day.    potassium chloride SA (K-DUR,KLOR-CON) 20 MEQ tablet Take 0.5 tablets (10 mEq total) by mouth daily. Qty: 30 tablet, Refills: 0    valsartan (DIOVAN) 80 MG tablet Take 2 tablets (160 mg total) by mouth daily.      CONTINUE these medications which have NOT CHANGED   Details  apixaban (ELIQUIS) 5 MG TABS tablet Take 1 tablet (5 mg total) by mouth 2 (two) times daily. Qty: 60 tablet, Refills: 0    Calcium Carbonate-Vit D-Min (CALCIUM 1200 PO) Take 1 tablet by mouth daily.     Cholecalciferol (VITAMIN D3) 1000 UNITS CAPS Take 1,000 Units by mouth daily.     FLUoxetine (PROZAC) 20 MG capsule Take 1 capsule (20 mg total) by mouth daily. Qty: 30 capsule, Refills: 11    levothyroxine (SYNTHROID, LEVOTHROID) 88 MCG tablet Take 1 tablet (88 mcg total) by mouth daily. Qty: 90 tablet, Refills: 1    metoprolol tartrate (LOPRESSOR) 25 MG tablet Take 1 tablet (25 mg total) by mouth 2 (two) times daily. Qty: 60 tablet, Refills: 0    morphine (MS CONTIN) 15 MG 12 hr  tablet Take 15 mg by mouth every 12 (twelve) hours.    nortriptyline (PAMELOR) 10 MG capsule Take 2 capsules (20 mg total) by mouth at bedtime. Qty: 180 capsule, Refills: 1    simvastatin (ZOCOR) 10 MG tablet TAKE 1 TABLET(10 MG) BY MOUTH DAILY Qty: 90 tablet, Refills: 3    vitamin B-12 (CYANOCOBALAMIN) 100 MCG tablet Take 100 mcg by mouth daily.       Allergies  Allergen Reactions  . Shrimp [Shellfish Allergy] Anaphylaxis and Rash    Break outs and swelling of the throat  . Tandem Plus [Fefum-Fepo-Fa-B Cmp-C-Zn-Mn-Cu] Nausea And Vomiting  . Ivp Dye [Iodinated Diagnostic Agents] Rash    itching  . Penicillins Itching and Rash    Has patient had a PCN reaction causing immediate rash, facial/tongue/throat swelling, SOB or lightheadedness with hypotension: Yes Has patient had a PCN reaction causing severe rash involving mucus membranes or skin necrosis: No Has patient had a PCN reaction that required hospitalization: No Has patient had a PCN reaction occurring within the last 10 years: No If all of the above answers are "NO", then may proceed with Cephalosporin  use.    Follow-up Information    Burns, Claudina Lick, MD. Schedule an appointment as soon as possible for a visit in 10 day(s).   Specialty:  Internal Medicine Why:  after discharge from [facility] information: [address] [place] [address] [phone]            The results of significant diagnostics from this hospitalization (including imaging, microbiology, ancillary and laboratory) are listed below for reference.    Significant Diagnostic Studies: Ct Head Wo Contrast  Result Date: 01/11/2017 CLINICAL DATA:  Recent fall.  12/19/2016 EXAM: CT HEAD WITHOUT CONTRAST TECHNIQUE: Contiguous axial images were obtained from the base of the skull through the vertex without intravenous contrast. COMPARISON:  12/19/2016 FINDINGS: Brain: Low -density within the left basal ganglia in the area of prior infarct with ray  hemorrhagic conversion. Low-density in the anterior left temporal lobe is also in the area of prior infarct. No acute infarction or hemorrhage. No hydrocephalus. No mass effect or midline shift. Vascular: No hyperdense vessel or unexpected calcification. Skull: No acute calvarial abnormality. Sinuses/Orbits: Visualized paranasal sinuses and mastoids clear. Orbital soft tissues unremarkable. Other: None IMPRESSION: Evolutionary changes and the area of hemorrhage in the left basal ganglia and prior infarcts seen in the anterior left temporal lobe. No acute infarction or hemorrhage. Electronically Signed   By: Rolm Baptise M.D.   On: 01/11/2017 14:50   Ct Head Wo Contrast  Result Date: 12/20/2016 CLINICAL DATA:  Acute onset of mental deterioration. Initial encounter. EXAM: CT HEAD WITHOUT CONTRAST TECHNIQUE: Contiguous axial images were obtained from the base of the skull through the vertex without intravenous contrast. COMPARISON:  CT of the head performed earlier today at 4:16 p.m. FINDINGS: Brain: The evolving subacute left MCA territory infarct is grossly unchanged from the recent prior study, with partial effacement of the left lateral ventricle, and approximately 2 mm of rightward shift. Previously noted hemorrhage transformation at the left basal ganglia is stable in appearance. No new infarcts are identified. There is no evidence of mass lesion. There is no evidence of hydrocephalus at this time. Mild cerebellar atrophy is noted. The brainstem and fourth ventricle are unremarkable in appearance. Vascular: No hyperdense vessel or unexpected calcification. Skull: There is no evidence of fracture; visualized osseous structures are unremarkable in appearance. Sinuses/Orbits: The orbits are within normal limits. The paranasal sinuses and mastoid air cells are well-aerated. Other: No significant soft tissue abnormalities are seen. IMPRESSION: Evolving subacute left MCA territory infarct is grossly unchanged from  the recent prior study, with partial effacement of the left lateral ventricle, and approximately 2 mm of rightward shift. Previously noted hemorrhagic transformation at the left basal ganglia is stable in appearance. Electronically Signed   By: Garald Balding M.D.   On: 12/20/2016 00:21   Ct Head Wo Contrast  Result Date: 12/19/2016 CLINICAL DATA:  Loss consciousness for 10 seconds. EXAM: CT HEAD WITHOUT CONTRAST TECHNIQUE: Contiguous axial images were obtained from the base of the skull through the vertex without intravenous contrast. COMPARISON:  Brain MRI 11/30/2016 FINDINGS: Brain: Subacute left MCA territory infarct with cytotoxic edema involving the left basal ganglia, deep white matter tracts, and lateral temporal lobe primarily. There was hemorrhagic conversion at basal ganglia, with decreased clot mass effect and high-density since prior. High-density is now mainly seen at the level of the putamen. Midline shift now measures 1-2 mm, as compared to 7 mm previously. No acute hemorrhage. No evidence of progressive infarct. Vascular: Atherosclerotic calcification. No hyperdense  vessel noted. Skull: No acute or aggressive finding. Sinuses/Orbits: Negative IMPRESSION: 1. No acute finding. 2. Expected evolution of subacute left MCA territory infarct with hemorrhagic conversion. Midline shift is nearly resolved. Electronically Signed   By: Monte Fantasia M.D.   On: 12/19/2016 16:43   Mr Brain Wo Contrast  Result Date: 01/12/2017 CLINICAL DATA:  76 y/o F; worsening right upper and lower extremity weakness and new onset aphasia Saturday. Weakness has resolved with residual aphasia. EXAM: MRI HEAD WITHOUT CONTRAST MRA HEAD WITHOUT CONTRAST TECHNIQUE: Multiplanar, multiecho pulse sequences of the brain and surrounding structures were obtained without intravenous contrast. Angiographic images of the head were obtained using MRA technique without contrast. COMPARISON:  01/11/2017 CT head.  11/30/2016 MRI of the  brain. FINDINGS: MRI HEAD FINDINGS Brain: There is mildly reduced diffusion within the left caudate body extending in the periventricular white matter and within the left medial thalamus with increased distribution in comparison with areas of infection seen on the prior MRI of the brain. There is mild associated T2 FLAIR hyperintense signal abnormality. Findings are compatible with interval acute to early subacute infarction. Hematoma within the left basal ganglia is decreased in size from the prior MRI of the brain and demonstrates increased T1/T2 signal compatible with late subacute hemorrhage. T2 FLAIR hyperintense signal abnormality with pons likely representing microvascular ischemic changes. Left anterior temporal encephalomalacia corresponding to region of infarction on the prior MRI of the brain. The area of infarction demonstrates hemosiderin staining. Additionally, there is hemosiderin staining along the margins of the left lateral ventricle frontal horn. No hydrocephalus, herniation, or evidence for interval hemorrhage. Vascular: As below. Skull and upper cervical spine: Normal marrow signal. Sinuses/Orbits: Negative. Other: None. MRA HEAD FINDINGS Internal carotid arteries:  Patent. Anterior cerebral arteries:  Patent. Middle cerebral arteries: Patent. Anterior communicating artery: Patent. Posterior communicating arteries: Fetal left PCA. No right posterior communicating artery identified, likely hypoplastic or absent. Posterior cerebral arteries:  Patent. Basilar artery:  Patent. Vertebral arteries:  Patent. No evidence of high-grade stenosis, large vessel occlusion, or aneurysm unless noted above. IMPRESSION: 1. Reduced diffusion within left caudate body extending in the periventricular white matter and within the left medial thalamus increased in distribution in comparison with prior MRI of the brain. Findings are consistent with interval acute/early subacute infarction. 2. Decreased size of late  subacute hematoma within the left basal ganglia. Left lateral ventricle hemosiderin staining. 3. Left anterior temporal lobe encephalomalacia from prior infarction. 4. Patent circle of Willis. No large vessel occlusion, aneurysm, or significant stenosis is identified. Electronically Signed   By: Kristine Garbe M.D.   On: 01/12/2017 02:50   Dg Chest Port 1 View  Result Date: 12/21/2016 CLINICAL DATA:  Acute respiratory failure with hypoxia. EXAM: PORTABLE CHEST 1 VIEW COMPARISON:  12/19/2016 FINDINGS: Chronic cardiomegaly. Chronic aortic atherosclerosis. The lungs remain radiographically clear. No heart failure or effusion. No significant bone finding. IMPRESSION: No active disease. Electronically Signed   By: Nelson Chimes M.D.   On: 12/21/2016 07:01   Dg Chest Port 1 View  Result Date: 12/19/2016 CLINICAL DATA:  Respiratory depression EXAM: PORTABLE CHEST 1 VIEW COMPARISON:  12/01/2016 FINDINGS: Cardiac shadow is mildly enlarged. The lungs are well aerated bilaterally. No focal infiltrate or sizable effusion is seen. No bony abnormality is noted. IMPRESSION: No acute abnormality seen. Electronically Signed   By: Inez Catalina M.D.   On: 12/19/2016 18:50   Dg Swallowing Func-speech Pathology  Result Date: 01/13/2017 Objective Swallowing Evaluation: Type of Study: MBS-Modified  Barium Swallow Study Patient Details Name: ULLA MCKIERNAN MRN: [id number] Date of Birth: 1941-07-08 Today's Date: 01/13/2017 Time: SLP Start Time (ACUTE ONLY): 1339-SLP Stop Time (ACUTE ONLY): 1359 SLP Time Calculation (min) (ACUTE ONLY): 20 min Past Medical History: Past Medical History: Diagnosis Date . Blood transfusion without reported diagnosis  . Chronic low back pain  . Hyperlipidemia  . Osteopenia  . Unspecified essential hypertension  . Unspecified hypothyroidism  Past Surgical History: Past Surgical History: Procedure Laterality Date . ABDOMINAL HYSTERECTOMY  1970 . CHOLECYSTECTOMY  07/2009  Dr. Rise Patience .  COLONOSCOPY  2003 . FLEXIBLE SIGMOIDOSCOPY  2010 . HAND SURGERY   . IR ANGIO INTRA EXTRACRAN SEL COM CAROTID INNOMINATE UNI L MOD SED  11/29/2016 . IR ANGIO VERTEBRAL SEL SUBCLAVIAN INNOMINATE UNI R MOD SED  11/29/2016 . IR PERCUTANEOUS ART THROMBECTOMY/INFUSION INTRACRANIAL INC DIAG ANGIO  11/29/2016 . LUMBAR LAMINECTOMY  11/2008  Done by Dr. Patrice Paradise . RADIOLOGY WITH ANESTHESIA N/A 11/29/2016  Procedure: RADIOLOGY WITH ANESTHESIA;  Surgeon: Radiologist, Medication, MD;  Location: [facility];  Service: Radiology;  Laterality: N/A; . THYROIDECTOMY   HPI: Quinci Gavidia [name] a 76 y.o.femalewith medical history significant of recent L MCA stroke in May, had mechanical thrombectomy and subsequent hemorrhagic conversion, readmission from rehab for opiate overdose and discharge to SNF, A.Fib. Admitted 6/25 with worsened right sided weakness and new onset aphasia. MRI Findings are consistent with interval acute/early subacute infarction with reduced diffusion within left caudate body extending in the periventricular white matter and within the left medial thalamus, increased in distribution in comparison with prior MRI of the brain. MBS 12/25/16 flash penetration to cords with thin; chin tuck and straw sip facilitated penetration above the cords. Dys 2, thin with chin tuck and small controlled sips with straw.  Subjective: pt alert, aphasic and dysarthric Assessment / Plan / Recommendation CHL IP CLINICAL IMPRESSIONS 01/13/2017 Clinical Impression MBS compared to 5/22 is overall similar with minimal improvements such as improved oral control and cohesion and laryngeal intrusion was penetration on anterior vestibule wall and did not aspirate during study. Decreased motor strength led to mild vallecular residual and min pyriform sinuses. Chin tuck appeared to prevent penetration however evidence of memory deficits not recalling prior MBS or hearing a recommendation for chin tuck prior. For increased safety and general deconditioning  with hospitalizations the majority of May and June, recommend she continue nectar thick liquids and Dys 2 (until able to upgrade at bedside) and pills whole in applesauce if small. Will continue ST SLP Visit Diagnosis Dysphagia, pharyngeal phase (R13.13) Attention and concentration deficit following -- Frontal lobe and executive function deficit following -- Impact on safety and function Moderate aspiration risk   CHL IP TREATMENT RECOMMENDATION 01/13/2017 Treatment Recommendations Therapy as outlined in treatment plan below   Prognosis 01/13/2017 Prognosis for Safe Diet Advancement Good Barriers to Reach Goals Cognitive deficits Barriers/Prognosis Comment -- CHL IP DIET RECOMMENDATION 01/13/2017 SLP Diet Recommendations Dysphagia 2 (Fine chop) solids;Nectar thick liquid Liquid Administration via Cup;No straw Medication Administration Whole meds with puree Compensations Slow rate;Small sips/bites;Minimize environmental distractions;Multiple dry swallows after each bite/sip Postural Changes Seated upright at 90 degrees   CHL IP OTHER RECOMMENDATIONS 01/13/2017 Recommended Consults -- Oral Care Recommendations Oral care BID Other Recommendations --   CHL IP FOLLOW UP RECOMMENDATIONS 01/13/2017 Follow up Recommendations (No Data)   CHL IP FREQUENCY AND DURATION 01/13/2017 Speech Therapy Frequency (ACUTE ONLY) min 2x/week Treatment Duration 2 weeks      CHL IP ORAL PHASE 01/13/2017 Oral  Phase Impaired Oral - Pudding Teaspoon -- Oral - Pudding Cup -- Oral - Honey Teaspoon -- Oral - Honey Cup -- Oral - Nectar Teaspoon -- Oral - Nectar Cup Right anterior bolus loss Oral - Nectar Straw -- Oral - Thin Teaspoon -- Oral - Thin Cup Right anterior bolus loss Oral - Thin Straw NT Oral - Puree NT Oral - Mech Soft NT Oral - Regular WFL Oral - Multi-Consistency -- Oral - Pill -- Oral Phase - Comment --  CHL IP PHARYNGEAL PHASE 01/13/2017 Pharyngeal Phase Impaired Pharyngeal- Pudding Teaspoon -- Pharyngeal -- Pharyngeal- Pudding Cup --  Pharyngeal -- Pharyngeal- Honey Teaspoon -- Pharyngeal -- Pharyngeal- Honey Cup -- Pharyngeal -- Pharyngeal- Nectar Teaspoon -- Pharyngeal -- Pharyngeal- Nectar Cup Pharyngeal residue - valleculae;Reduced laryngeal elevation Pharyngeal -- Pharyngeal- Nectar Straw -- Pharyngeal -- Pharyngeal- Thin Teaspoon -- Pharyngeal -- Pharyngeal- Thin Cup Penetration/Aspiration during swallow;Reduced airway/laryngeal closure;Pharyngeal residue - valleculae;Reduced laryngeal elevation Pharyngeal Material enters airway, remains ABOVE vocal cords then ejected out;Material enters airway, remains ABOVE vocal cords and not ejected out Pharyngeal- Thin Straw NT Pharyngeal -- Pharyngeal- Puree NT Pharyngeal -- Pharyngeal- Mechanical Soft NT Pharyngeal -- Pharyngeal- Regular Pharyngeal residue - valleculae Pharyngeal -- Pharyngeal- Multi-consistency -- Pharyngeal -- Pharyngeal- Pill -- Pharyngeal -- Pharyngeal Comment --  CHL IP CERVICAL ESOPHAGEAL PHASE 01/13/2017 Cervical Esophageal Phase WFL Pudding Teaspoon -- Pudding Cup -- Honey Teaspoon -- Honey Cup -- Nectar Teaspoon -- Nectar Cup -- Nectar Straw -- Thin Teaspoon -- Thin Cup -- Thin Straw -- Puree -- Mechanical Soft -- Regular -- Multi-consistency -- Pill -- Cervical Esophageal Comment -- CHL IP GO 01/12/2017 Functional Assessment Tool Used skilled clinical observation Functional Limitations Spoken language expressive Swallow Current Status (T6226) CL Swallow Goal Status (J3354) CK Swallow Discharge Status (T6256) (None) Motor Speech Current Status (L8937) (None) Motor Speech Goal Status (D4287) (None) Motor Speech Goal Status (G8115) (None) Spoken Language Comprehension Current Status (B2620) (None) Spoken Language Comprehension Goal Status (B5597) (None) Spoken Language Comprehension Discharge Status (C1638) (None) Spoken Language Expression Current Status (G5364) CL Spoken Language Expression Goal Status (W8032) CK Spoken Language Expression Discharge Status (Z2248) (None)  Attention Current Status (G5003) (None) Attention Goal Status (B0488) (None) Attention Discharge Status (Q9169) (None) Memory Current Status (I5038) (None) Memory Goal Status (U8280) (None) Memory Discharge Status (K3491) (None) Voice Current Status (P9150) (None) Voice Goal Status (V6979) (None) Voice Discharge Status (Y8016) (None) Other Speech-Language Pathology Functional Limitation Current Status (P5374) (None) Other Speech-Language Pathology Functional Limitation Goal Status (M2707) (None) Other Speech-Language Pathology Functional Limitation Discharge Status [phone]) (None) Houston Siren 01/13/2017, 3:24 PM  Orbie Pyo Colvin Caroli.Ed CCC-SLP Pager [phone]             Dg Swallowing Func-speech Pathology  Result Date: 12/25/2016 Objective Swallowing Evaluation: Type of Study: MBS-Modified Barium Swallow Study Patient Details Name: KENNA SEWARD MRN: [id number] Date of Birth: Nov 29, 1940 Today's Date: 12/25/2016 Time: SLP Start [id number] -SLP Stop Time: 81 SLP Time Calculation (min) (ACUTE ONLY): 23 min Past Medical History: Past Medical History: Diagnosis Date . Blood transfusion without reported diagnosis  . Chronic low back pain  . Hyperlipidemia  . Osteopenia  . Unspecified essential hypertension  . Unspecified hypothyroidism  Past Surgical History: Past Surgical History: Procedure Laterality Date . ABDOMINAL HYSTERECTOMY  1970 . CHOLECYSTECTOMY  07/2009  Dr. Rise Patience . COLONOSCOPY  2003 . FLEXIBLE SIGMOIDOSCOPY  2010 . HAND SURGERY   . IR ANGIO INTRA EXTRACRAN SEL COM CAROTID INNOMINATE UNI L MOD SED  11/29/2016 .  IR ANGIO VERTEBRAL SEL SUBCLAVIAN INNOMINATE UNI R MOD SED  11/29/2016 . IR PERCUTANEOUS ART THROMBECTOMY/INFUSION INTRACRANIAL INC DIAG ANGIO  11/29/2016 . LUMBAR LAMINECTOMY  11/2008  Done by Dr. Patrice Paradise . RADIOLOGY WITH ANESTHESIA N/A 11/29/2016  Procedure: RADIOLOGY WITH ANESTHESIA;  Surgeon: Radiologist, Medication, MD;  Location: [facility];  Service: Radiology;  Laterality: N/A; .  THYROIDECTOMY   HPI: 76yo CF with acute L MCA CVA with secondary hemorrhage transferred from rehab for acute encephalopathy likely toxic 2/2 narcotic accumulation given her response to narcan. CT head demonstrates stability - improvement and other metabolic indices/septic work up has been unrevealing. Pt receiving skilled ST in CIR for dysphagia and aphasia, had advanced to D2/nectar thick liquids prior to this admission. Recent MBS 12/08/16 findings mild-moderate oropharyngeal dysphagia with motor planning and sensorimotor deficits, recommended Dys 2/nectar with full supervision, meds whole in puree.  Repeat MBS ordered to determine readiness for advancement given improvements noted at bedside.   Subjective: pt alert, aphasic and dysarthric Assessment / Plan / Recommendation [facility] IP CLINICAL IMPRESSIONS 12/25/2016 Clinical Impression Pt presents with improved swallowing function in comparison to previous swallow evaluation.  Pt demonstrated timely swallow initiation with no aspiration or penetration of nectar thick liquids via cup sips.  Decreased bolus cohesion with thin liquids resulted in delay in swallow initiation to the level of the pyriforms which lead to deep penetration to the cords which cleared on second swallow.  Use of chin tuck facilitated by straw kept penetration above the level of the cords, especially when pt consumed smaller boluses.  As a result, recommend that pt's diet be advanced to dys 2 textures, thin liquids with full supervision for use of chin tuck and small controlled sips via straw.   SLP Visit Diagnosis Dysphagia, oropharyngeal phase (R13.12)     Impact on safety and function Mild aspiration risk     [facility] IP DIET RECOMMENDATION 12/25/2016 SLP Diet Recommendations Dysphagia 2 (Fine chop) solids;Thin liquid Liquid Administration via Cup;Straw Medication Administration Whole meds with puree Compensations Slow rate;Small sips/bites;Monitor for anterior loss;Multiple dry swallows after each  bite/sip;Chin tuck;Clear throat intermittently Postural Changes Seated upright at 90 degrees            [facility] IP ORAL PHASE 12/25/2016 Oral Phase Impaired Oral - Pudding Teaspoon -- Oral - Pudding Cup -- Oral - Honey Teaspoon -- Oral - Honey Cup -- Oral - Nectar Teaspoon -- Oral - Nectar Cup WFL Oral - Nectar Straw -- Oral - Thin Teaspoon -- Oral - Thin Cup Decreased bolus cohesion;Premature spillage Oral - Thin Straw Decreased bolus cohesion;Premature spillage Oral - Puree WFL Oral - Mech Soft Premature spillage;Impaired mastication;Decreased bolus cohesion Oral - Regular -- Oral - Multi-Consistency -- Oral - Pill -- Oral Phase - Comment --  [facility] IP PHARYNGEAL PHASE 12/25/2016 Pharyngeal Phase Impaired Pharyngeal- Pudding Teaspoon -- Pharyngeal -- Pharyngeal- Pudding Cup -- Pharyngeal -- Pharyngeal- Honey Teaspoon -- Pharyngeal -- Pharyngeal- Honey Cup -- Pharyngeal -- Pharyngeal- Nectar Teaspoon -- Pharyngeal -- Pharyngeal- Nectar Cup WFL Pharyngeal -- Pharyngeal- Nectar Straw -- Pharyngeal -- Pharyngeal- Thin Teaspoon -- Pharyngeal -- Pharyngeal- Thin Cup Delayed swallow initiation-pyriform sinuses;Penetration/Aspiration during swallow Pharyngeal Material enters airway, CONTACTS cords and then ejected out Pharyngeal- Thin Straw Compensatory strategies attempted (with notebox);Penetration/Aspiration during swallow;Delayed swallow initiation-pyriform sinuses Pharyngeal Material enters airway, remains ABOVE vocal cords then ejected out Pharyngeal- Puree Pharyngeal residue - valleculae Pharyngeal -- Pharyngeal- Mechanical Soft Pharyngeal residue - valleculae Pharyngeal -- Pharyngeal- Regular -- Pharyngeal -- Pharyngeal- Multi-consistency -- Pharyngeal --  Pharyngeal- Pill -- Pharyngeal -- Pharyngeal Comment --  [facility] IP CERVICAL ESOPHAGEAL PHASE 12/25/2016 Cervical Esophageal Phase WFL Pudding Teaspoon -- Pudding Cup -- Honey Teaspoon -- Honey Cup -- Nectar Teaspoon -- Nectar Cup -- Nectar Straw -- Thin Teaspoon -- Thin Cup --  Thin Straw -- Puree -- Mechanical Soft -- Regular -- Multi-consistency -- Pill -- Cervical Esophageal Comment -- No flowsheet data found. Page, Selinda Orion 12/25/2016, 2:38 PM              Mr Jodene Nam Head/brain Wo Cm  Result Date: 01/12/2017 CLINICAL DATA:  76 y/o F; worsening right upper and lower extremity weakness and new onset aphasia Saturday. Weakness has resolved with residual aphasia. EXAM: MRI HEAD WITHOUT CONTRAST MRA HEAD WITHOUT CONTRAST TECHNIQUE: Multiplanar, multiecho pulse sequences of the brain and surrounding structures were obtained without intravenous contrast. Angiographic images of the head were obtained using MRA technique without contrast. COMPARISON:  01/11/2017 CT head.  11/30/2016 MRI of the brain. FINDINGS: MRI HEAD FINDINGS Brain: There is mildly reduced diffusion within the left caudate body extending in the periventricular white matter and within the left medial thalamus with increased distribution in comparison with areas of infection seen on the prior MRI of the brain. There is mild associated T2 FLAIR hyperintense signal abnormality. Findings are compatible with interval acute to early subacute infarction. Hematoma within the left basal ganglia is decreased in size from the prior MRI of the brain and demonstrates increased T1/T2 signal compatible with late subacute hemorrhage. T2 FLAIR hyperintense signal abnormality with pons likely representing microvascular ischemic changes. Left anterior temporal encephalomalacia corresponding to region of infarction on the prior MRI of the brain. The area of infarction demonstrates hemosiderin staining. Additionally, there is hemosiderin staining along the margins of the left lateral ventricle frontal horn. No hydrocephalus, herniation, or evidence for interval hemorrhage. Vascular: As below. Skull and upper cervical spine: Normal marrow signal. Sinuses/Orbits: Negative. Other: None. MRA HEAD FINDINGS Internal carotid arteries:  Patent. Anterior  cerebral arteries:  Patent. Middle cerebral arteries: Patent. Anterior communicating artery: Patent. Posterior communicating arteries: Fetal left PCA. No right posterior communicating artery identified, likely hypoplastic or absent. Posterior cerebral arteries:  Patent. Basilar artery:  Patent. Vertebral arteries:  Patent. No evidence of high-grade stenosis, large vessel occlusion, or aneurysm unless noted above. IMPRESSION: 1. Reduced diffusion within left caudate body extending in the periventricular white matter and within the left medial thalamus increased in distribution in comparison with prior MRI of the brain. Findings are consistent with interval acute/early subacute infarction. 2. Decreased size of late subacute hematoma within the left basal ganglia. Left lateral ventricle hemosiderin staining. 3. Left anterior temporal lobe encephalomalacia from prior infarction. 4. Patent circle of Willis. No large vessel occlusion, aneurysm, or significant stenosis is identified. Electronically Signed   By: Kristine Garbe M.D.   On: 01/12/2017 02:50    Microbiology: No results found for this or any previous visit (from the past 240 hour(s)).   Labs: Basic Metabolic Panel:  Recent Labs Lab 01/11/17 1841 01/12/17 0705 01/13/17 0719 01/14/17 0522  NA 139 139 141 142  K 4.8 4.1 4.2 3.9  CL 104 105 109 113*  CO2 28 24 25 22   GLUCOSE 127* 129* 114* 104*  BUN 48* 40* 34* 21*  CREATININE 3.02* 2.46* 1.59* 1.25*  CALCIUM 9.2 9.0 8.6* 8.6*   Liver Function Tests:  Recent Labs Lab 01/11/17 1841  AST 18  ALT 44  ALKPHOS 133*  BILITOT 0.4  PROT 6.3*  ALBUMIN 3.1*   CBC:  Recent Labs Lab 01/11/17 1841  WBC 10.5  NEUTROABS 7.5  HGB 10.9*  HCT 34.7*  MCV 93.8  PLT 296    Signed:  Barton Dubois MD.  [facility] 01/14/2017, 6:10 PM

## 2017-01-14 NOTE — Progress Notes (Signed)
STROKE TEAM PROGRESS NOTE   HISTORY OF PRESENT ILLNESS (per record) [name] a 76 y.o.femalewith history of chronic abdominal and LBP, CKD, hyperlipidemia who fell out of bed on 5/13 am and was found to have left facial weakness with slurred speech and right sided weakness.  CT Head showed hyperdense L-MCA sign with left M1 embolic occlusion.  CT angio with complete revascularization of L-MCA with reperfusion with hemorrhagic transformation of left temporal infarct and minimal SDH.  MRI brain showed L-MCA infarct with diffusion restriction left temporal lobe, superimposed 5.5 cm intra-axial hemorrhage centered at left basal ganglia with associated left hemisphere edema, effaced left lateral ventricle with trace IVH and occasional small foci of restriction in left thalamus and anterior left occipital lobe.  She was diagnosed with new-onset atrial fibrillation and started on Eliquis.    She presents back to the ED on 01/11/2017 with worsened right upper and lower extremity weakness with new onset aphasia on Saturday. The weakness has resolved back to her baseline of post-stroke right sided weakness, but the aphasia continues. She was just discharged from rehab on Thursday. She was receiving her Eliquis in rehab but did not receive her doses on Friday or Saturday due to troubles either with finances or with family ability to get to the pharmacy to have the prescriptions filled, according to her daughters, who are unable to specify the circumstances in more detail. She was restarted on Eliquis on Sunday after the prescription was filled. Daughters state that the situation in the patient's home is chaotic, with illicit drug use taking place and an overdose on heroin by one family member recently; there are no other family members present to corroborate this.   Patient was not administered IV t-PA secondary to recent [place]. She was admitted to General Neurology for further evaluation and  treatment.   SUBJECTIVE (INTERVAL HISTORY) Her husband and brother are    at the bedside. Patient is better but has not returned back to her baseline.Family wants inpatient rehab and are not willing for SNF rehab   OBJECTIVE Temp:  [97.8 F (36.6 C)-98.6 F (37 C)] 97.8 F (36.6 C) (06/28 1255) Pulse Rate:  [68-82] 68 (06/28 1255) Cardiac Rhythm: Atrial flutter (06/28 0700) Resp:  [18-20] 18 (06/28 1255) BP: (116-138)/(52-75) 137/75 (06/28 1255) SpO2:  [93 %-100 %] 97 % (06/28 1255) Weight:  [176 lb (79.8 kg)] 176 lb (79.8 kg) (06/27 1554)  CBC:   Recent Labs Lab 01/11/17 1841  WBC 10.5  NEUTROABS 7.5  HGB 10.9*  HCT 34.7*  MCV 93.8  PLT 458    Basic Metabolic Panel:   Recent Labs Lab 01/13/17 0719 01/14/17 0522  NA 141 142  K 4.2 3.9  CL 109 113*  CO2 25 22  GLUCOSE 114* 104*  BUN 34* 21*  CREATININE 1.59* 1.25*  CALCIUM 8.6* 8.6*    Lipid Panel:     Component Value Date/Time   CHOL 162 11/30/2016 0329   TRIG 175 (H) 11/30/2016 0329   HDL 42 11/30/2016 0329   CHOLHDL 3.9 11/30/2016 0329   VLDL 35 11/30/2016 0329   LDLCALC 85 11/30/2016 0329   HgbA1c:  Lab Results  Component Value Date   HGBA1C 5.8 (H) 11/30/2016   Urine Drug Screen:     Component Value Date/Time   LABOPIA POSITIVE (A) 01/12/2017 0053   COCAINSCRNUR NONE DETECTED 01/12/2017 0053   LABBENZ NONE DETECTED 01/12/2017 0053   AMPHETMU NONE DETECTED 01/12/2017 0053   THCU NONE DETECTED 01/12/2017  [place] DETECTED 01/12/2017 0053    Alcohol Level No results found for: [place] Head Wo Contrast 01/11/2017 IMPRESSION: Evolutionary changes and the area of hemorrhage in the left basal ganglia and prior infarcts seen in the anterior left temporal lobe. No acute infarction or hemorrhage.  Mr Brain 32 Contrast Mr Jodene Nam Head/brain Wo Cm 01/12/2017 IMPRESSION: 1. Reduced diffusion within left caudate body extending in the periventricular white matter and within the  left medial thalamus increased in distribution in comparison with prior MRI of the brain. Findings are consistent with interval acute/early subacute infarction. 2. Decreased size of late subacute hematoma within the left basal ganglia. Left lateral ventricle hemosiderin staining. 3. Left anterior temporal lobe encephalomalacia from prior infarction. 4. Patent circle of Willis. No large vessel occlusion, aneurysm, or significant stenosis is identified.      PHYSICAL EXAM Frail elderly Caucasian lady currently not in distress. . Afebrile. Head is nontraumatic. Neck is supple without bruit.    Cardiac exam no murmur or gallop. Lungs are clear to auscultation. Distal pulses are well felt. Neurological Exam :  Awake alert moderate expressive aphasia as well as dysarthria. Can speak short sentences and simple words. Does have significant word hesitation and aphasic errors. Difficulty with naming and repetition. Comprehension appears better preserved. Extraocular movements appear full range but there is some left gaze preference. Decreased blink to threat on the right compared to the left. Pupils equal reactive. Vision acuity seems adequate. Fundi were not visualized. Right lower facial weakness. Tongue midline. Hearing is intact. Motor system exam shows right hemiplegia with 3/5 right upper extremity and 4/5 right lower extremity strength with significant weakness of right grip and intrinsic hand muscles and right ankle dorsiflexors. Normal strength on the left. Tone is increased on the right side. The tendon reflexes are brisker on the right compared to the left. Lantus upgoing on the right and downgoing on the left. Sensation appears preserved bilaterally. Gait was not tested. ASSESSMENT/PLAN Valerie Mcclure is a 76 y.o. female with history of chronic abdominal and LBP, CKD, hyperlipidemia presenting 01/11/2017 with worsened right upper and lower extremity weakness with new onset aphasia after  revascularization of L-MCA with hemorrhagic transformation on 11/30/2016. She did not receive IV t-PA due to recent ICH.    Stroke:  acute/early subacute infarction of left caudate body extending in the periventricular white matter and within the left medial thalamus likely extension of recent stroke from large vessel disease status post left MCA mechanical temp thrombectomy in May 2018. Recent intracerebral hemorrhage following mechanical embolectomy has fortunately remained stable despite being started on eliquis  CT head: Evolutionary changes and the area of hemorrhage in the left basal ganglia and prior infarcts seen in the anterior left temporal lobe  MRI head: acute/early subacute infarction ofl eft caudate body extending in the periventricular white matter and within the left medial thalamus  MRA head: Patent circle of Willis. No large vessel occlusion, aneurysm, or significant stenosis is identified.   2D Echo: EF 60-65%. No source of embolus  Vas [place] BLE: no DVT   LDL 85  HgbA1c 5.8  SCDs for VTE prophylaxis DIET DYS 3 Room service appropriate? Yes; Fluid consistency: Nectar Thick  Eliquis (apixaban) daily prior to admission, now on Eliquis (apixaban) daily  Patient counseled to be compliant with her antithrombotic medications  Ongoing aggressive stroke risk factor management  Therapy recommendations: CIR;Supervision for mobility/OOB  Disposition:  Pending   Hyperlipidemia  Home meds: none,   LDL 85, goal < 70  Continue statin  Other Stroke Risk Factors  Advanced age  Hx stroke/TIA  Opioid abuse  Other Active Problems  None  Hospital day # 2  I have personally examined this patient, reviewed notes, independently viewed imaging studies, participated in medical decision making and plan of care.ROS completed by me personally and pertinent positives fully documented  I have made any additions or clarifications directly to the above note. She had recent left  middle cerebral artery infarct in May 2018 and underwent mechanical embolectomy and had shown moderate improvement with mild residual expressive aphasia and right hemiparesis that has had recent clinical worsening likely due to subacute extension of recent infarct. Fortunately the patient's recent intracerebral hemorrhage has not extended despite being on eliquis. MRA however shows patent left middle cerebral artery. Patient does have  atrial fibrillation and was on eliquis. I had a long discussion the patient , her brother  and husband regarding lack of definite clinical data suggesting switching eliquis to alternative anticoagulant being any better. After discussion patient and husband agreed to stay on eliquis now. Continue ongoing  physical and occupational therapy  . No need for extensive stroke workup given patient's recent admission for the same problem.  Await rehab decision. D/w Dr Delice Lesch and rehab team PA. Time spent 25 mins Antony Contras, MD Medical Director [facility] Pager: [phone] 01/14/2017 1:54 PM   To contact Stroke Continuity provider, please refer to [email or web address] After hours, contact General Neurology

## 2017-01-14 NOTE — Progress Notes (Addendum)
  Speech Language Pathology Treatment: Dysphagia  Patient Details Name: Valerie Mcclure MRN: [id number] DOB: 12/24/1940 Today's Date: 01/14/2017 Time: 3202-3343 SLP Time Calculation (min) (ACUTE ONLY): 20 min  Assessment / Plan / Recommendation Clinical Impression  Husband present and educated/reviewed yesterday's MBS results and recommendations. No indications of difficulty with nectar juice. Consumed trial of upgraded solids (D3) without difficulty masticating or transitioning. Verbal cues provided to remove oral residue if present. Recommend upgrade to Dys 3, continue nectar liquids. ST will continue for dysphagia and aphasia. She continues to be appropriate for CIR to increase independence for communication and dysphagia.   HPI HPI: Valerie Hauger [name] a 76 y.o.femalewith medical history significant of recent L MCA stroke in May, had mechanical thrombectomy and subsequent hemorrhagic conversion, readmission from rehab for opiate overdose and discharge to SNF, A.Fib. Admitted 6/25 with worsened right sided weakness and new onset aphasia. MRI Findings are consistent with interval acute/early subacute infarction with reduced diffusion within left caudate body extending in the periventricular white matter and within the left medial thalamus, increased in distribution in comparison with prior MRI of the brain. MBS 12/25/16 flash penetration to cords with thin; chin tuck and straw sip facilitated penetration above the cords. Dys 2, thin with chin tuck and small controlled sips with straw.       SLP Plan  Continue with current plan of care       Recommendations  Diet recommendations: Dysphagia 3 (mechanical soft);Nectar-thick liquid Liquids provided via: Cup;No straw Medication Administration: Whole meds with puree Supervision: Patient able to self feed;Full supervision/cueing for compensatory strategies Compensations: Slow rate;Small sips/bites;Minimize environmental  distractions;Multiple dry swallows after each bite/sip Postural Changes and/or Swallow Maneuvers: Seated upright 90 degrees;Upright 30-60 min after meal                General recommendations: Rehab consult Oral Care Recommendations: Oral care BID Follow up Recommendations: Inpatient Rehab SLP Visit Diagnosis: Dysphagia, pharyngeal phase (R13.13) Plan: Continue with current plan of care       GO                Houston Siren 01/14/2017, 11:16 AM  Orbie Pyo Colvin Caroli.[employer] [phone]

## 2017-01-14 NOTE — Progress Notes (Addendum)
Inpatient Rehabilitation  I spoke with Dr. [name] regarding patient's extension/new CVA and worsening deficits, which is not her baseline level of functioning.  I also spoke with patient's spouse who wishes for her to return to [facility] prior to returning home with him.  Insruance authorization has been initiated.  Plan to follow up with the team as I know.  Please call with questions.   Carmelia Roller., CCC/SLP Admission Coordinator  [place]  Cell [phone]

## 2017-01-14 NOTE — Progress Notes (Signed)
Inpatient Rehabilitation  I have spoken with the patient and spouse who are adamant that they want IP Rehab and to return home together.  I have insurance authorization, a bed available, and medical clearance to admit patient to IP Rehab.  Please call with questions.   Carmelia Roller., CCC/SLP Admission Coordinator  [place]  Cell [phone]

## 2017-01-14 NOTE — PMR Pre-admission (Signed)
PMR Admission Coordinator Pre-Admission Assessment  Patient: Valerie Mcclure is an 76 y.o., female MRN: [id number] DOB: 03/12/1941 Height: 5\' 3"  (160 cm) Weight: 79.8 kg (176 lb)              Insurance Information HMO: Yes   PPO:       PCP:       IPA:       80/20:       OTHER:  Group # [id number] PRIMARY: [place]      Policy#: [id number]      Subscriber:  patient CM Name: Vevelyn Royals      Phone#: [phone]     Fax#: [phone] Pre-Cert#: [id number]      Employer: Retired  Benefits:  Phone #: [phone]     Name:  Hanley Seamen. Date: 07/20/16     Deduct:  $0      Out of Pocket Max: $4400     Life Max: N/A CIR: $345 days 1-5      SNF: $0 days 1-20; $160 days 21-48; $0 days 49-100 Outpatient:  Medical necessity     Co-Pay: $40/visit Home Health: 100%      Co-Pay: none DME: 80%     Co-Pay: 20% Providers: in network  Medicaid Application Date:        Case Manager:   Disability Application Date:        Case Worker:     Emergency Facilities manager Information    Name Relation Home Work [place] Spouse [place] Daughter   [phone]   Jim Like Daughter [phone]     [facility] Relative   [phone]     Current Medical History  Patient Admitting Diagnosis: Extension of stroke with worsening of right sided weakness with apraxia and aphasia   History of Present Illness: Valerie Mcclure a 76 y.o.femalewith history of chronic LBP, CKD, L-MCA infarct s/p revascularization with hemorrhagic transformation with resultant right sided weakness, aphasia, inattention, and new diagnosis of AFib. She underwent CIRstay complicated by encephalopathy due to narcotics (discontiued)and was discharged to home 01/07/17 at [facility] assist level. She was readmitted 01/11/17 with increase in aphasia and worsening of right sided weakness. She had not received Eliquis x2 days at home and MS contin had been resumed. Daughters expressed  concerns about safety at home with illicit drug use and heroin overdose by a family member recently. UDS positive for opiates. She was started on IV fluid for hydration andMRI brain done showing interval acute/early subacute infarct in left caudate extending to periventricular and left medial thalamus, left anterior temporal lobe encephalomalacia, decrease in size of left basal ganglia subacute hematoma. TPA not administered due to recent ICH. Dr. [name] recommended continuing Eliquis and compliance with antithrombotic medications.   MBS done to evaluate swallow and patient currently on dysphagia 3 textures with nectar-thick liquids due to dysphagia and aspiration risk. Cognitive-linguistic evaluation done revealing perseverative language with moderate expressive aphasia and comprehension 60% intact for biographic and Y/N questions. Therapy initiated and patient limited by decrease in RUE motor planning, decreased balance, aphasia, RLE ataxia, and weakness causing decline in mobility and ability to carry out ADL tasks. CIR recommended for follow up therapy and patient admitted to [facility] 01/14/17.   NIH Total: 9    Past Medical History  Past Medical History:  Diagnosis Date  . Blood transfusion without reported diagnosis   . Chronic low back pain   . Hyperlipidemia   . Osteopenia   .  Unspecified essential hypertension   . Unspecified hypothyroidism     Family History  family history includes Heart disease (age of onset: 70) in her father; Lung cancer (age of onset: 4) in her brother.  Prior Rehab/Hospitalizations:  Has the patient had major surgery during 100 days prior to admission? No  Current Medications   Current Facility-Administered Medications:  .   stroke: mapping our early stages of recovery book, , Does not apply, Once, Alcario Drought, Jared M, DO .  0.9 %  sodium chloride infusion, , Intravenous, Continuous, Etta Quill, DO, Last Rate: 75 mL/hr at 01/13/17 0640 .  acetaminophen  (TYLENOL) tablet 650 mg, 650 mg, Oral, Q4H PRN **OR** acetaminophen (TYLENOL) solution 650 mg, 650 mg, Per Tube, Q4H PRN **OR** acetaminophen (TYLENOL) suppository 650 mg, 650 mg, Rectal, Q4H PRN, Etta Quill, DO .  apixaban (ELIQUIS) tablet 5 mg, 5 mg, Oral, BID, Jennette Kettle M, DO, 5 mg at 01/14/17 0955 .  chlorhexidine (PERIDEX) 0.12 % solution 15 mL, 15 mL, Mouth Rinse, BID, Velvet Bathe, MD, 15 mL at 01/14/17 0955 .  FLUoxetine (PROZAC) capsule 20 mg, 20 mg, Oral, Daily, Jennette Kettle M, DO, 20 mg at 01/14/17 0955 .  lactated ringers bolus 250 mL, 250 mL, Intravenous, Once, Mu, Frank, MD .  lactated ringers infusion, , Intravenous, Continuous, Mu, Frank, MD .  levothyroxine (SYNTHROID, LEVOTHROID) tablet 88 mcg, 88 mcg, Oral, QAC breakfast, Etta Quill, DO, 88 mcg at 01/14/17 0954 .  MEDLINE mouth rinse, 15 mL, Mouth Rinse, q12n4p, Velvet Bathe, MD, 15 mL at 01/13/17 1600 .  metoprolol tartrate (LOPRESSOR) tablet 25 mg, 25 mg, Oral, BID, Etta Quill, DO, 25 mg at 01/14/17 0954 .  nortriptyline (PAMELOR) capsule 20 mg, 20 mg, Oral, QHS, Gardner, Jared M, DO, 20 mg at 01/13/17 2252 .  ondansetron (ZOFRAN) injection 4 mg, 4 mg, Intravenous, Q6H PRN, Alcario Drought, Toy Care, DO .  RESOURCE THICKENUP CLEAR, , Oral, PRN, Velvet Bathe, MD .  senna-docusate (Senokot-S) tablet 1 tablet, 1 tablet, Oral, QHS PRN, Etta Quill, DO .  simvastatin (ZOCOR) tablet 10 mg, 10 mg, Oral, q1800, Etta Quill, DO, 10 mg at 01/13/17 1733  Patients Current Diet: DIET DYS 3 Room service appropriate? Yes; Fluid consistency: Nectar Thick  Precautions / Restrictions Precautions Precautions: Fall Precaution Comments: R sided weakness, R inattention Restrictions Weight Bearing Restrictions: No   Has the patient had 2 or more falls or a fall with injury in the past year?No  Prior Activity Level Community (5-7x/wk): Prior to this admission patient was a Min assist with transfers and gait and  had family support to assist at home.    Home Assistive Devices / Equipment Home Assistive Devices/Equipment: None Home Equipment: Bedside commode, Cane - single point  Prior Device Use: Indicate devices/aids used by the patient prior to current illness, exacerbation or injury? Walker  Prior Functional Level Prior Function Level of Independence: Needs assistance Gait / Transfers Assistance Needed: Pt/family reports ambulating without AD ADL's / Homemaking Assistance Needed: Pt had recently returned home from SNF level rehab. She was requiring assistance for dressing/bathing tasks.  Self Care: Did the patient need help bathing, dressing, using the toilet or eating? Needed some help  Indoor Mobility: Did the patient need assistance with walking from room to room (with or without device)? Needed some help  Stairs: Did the patient need assistance with internal or external stairs (with or without device)? Needed some help  Functional Cognition: Did the patient  need help planning regular tasks such as shopping or remembering to take medications? Needed some help  Current Functional Level Cognition  Arousal/Alertness: Awake/alert Overall Cognitive Status: Difficult to assess Difficult to assess due to: Impaired communication Current Attention Level: Selective Orientation Level: Oriented to person, Disoriented to time, Disoriented to situation, Oriented to place Following Commands: Follows one step commands consistently, Follows multi-step commands with increased time Safety/Judgement: Decreased awareness of deficits, Decreased awareness of safety General Comments: at times following commands with oposite site Attention: Sustained Sustained Attention: Appears intact Memory:  (to be assessed further) Awareness: Impaired Awareness Impairment: Intellectual impairment, Emergent impairment Problem Solving:  (TBA) Safety/Judgment: Impaired    Extremity Assessment (includes  Sensation/Coordination)  Upper Extremity Assessment: Defer to OT evaluation RUE Deficits / Details: Unable to sustain contraction throughout RUE with jerking/tremoring movement. Able to complete full AROM but unable to sustain position.  RUE Coordination: decreased gross motor, decreased fine motor  Lower Extremity Assessment: RLE deficits/detail, LLE deficits/detail RLE Deficits / Details: Strength grossly 3/5.  Decreased coordination.  RLE Coordination: decreased gross motor LLE Coordination: decreased gross motor    ADLs  Overall ADL's : Needs assistance/impaired Eating/Feeding: Minimal assistance, Sitting Grooming: Minimal assistance, Sitting Upper Body Bathing: Moderate assistance, Sitting Lower Body Bathing: Sit to/from stand, Maximal assistance Upper Body Dressing : Moderate assistance, Sitting Lower Body Dressing: Sit to/from stand, Maximal assistance Toilet Transfer: Moderate assistance, Stand-pivot Toilet Transfer Details (indicate cue type and reason): Attempting use of RW initially as pt reporting that she was using this at home (family later clarified that she was not utilizing AD). Pt with inattention to R side and inability to grasp RW with R hand and completed transfer without RW consequently.  Toileting- Clothing Manipulation and Hygiene: Maximal assistance, Sit to/from stand Functional mobility during ADLs: Moderate assistance (stand-pivot transfer only) General ADL Comments: Poor coordination and control of R UE impacting her ability to participate.     Mobility  Overal bed mobility: Needs Assistance Bed Mobility: Supine to Sit Rolling: Min guard Sidelying to sit: Mod assist Supine to sit: Mod assist, HOB elevated General bed mobility comments: up in recliner upon arrival    Transfers  Overall transfer level: Needs assistance Equipment used: Rolling walker (2 wheeled) Transfers: Sit to/from Stand Sit to Stand: Min assist Stand pivot transfers: Mod  assist General transfer comment: MIN A to power up from recliner with tendency to rely on posterior legs with transfer.  Multiple reps with cues for hand placement and forward weight shift    Ambulation / Gait / Stairs / Wheelchair Mobility  Ambulation/Gait Ambulation/Gait assistance: Min assist, Mod assist Ambulation Distance (Feet): 80 Feet Assistive device: Rolling walker (2 wheeled) Gait Pattern/deviations: Narrow base of support, Decreased step length - right, Decreased step length - left, Trendelenburg General Gait Details: No R LE buckeling today, but weakness noted in R LE throughout gait.  A to maintain R hand on RW.  Son followed behind with recliner. Gait velocity: decreased    Posture / Balance Dynamic Sitting Balance Sitting balance - Comments: min guard in sitting in chair, cues for upright positioning  Balance Overall balance assessment: Needs assistance Sitting-balance support: Single extremity supported, Feet supported, Feet unsupported Sitting balance-Leahy Scale: Poor Sitting balance - Comments: min guard in sitting in chair, cues for upright positioning  Postural control: Posterior lean Standing balance support: Bilateral upper extremity supported Standing balance-Leahy Scale: Poor Standing balance comment: Posterior lean with pt requiring mod physical assistance and B UE support.  Manual  assist to maintain RUE support on RW    Special needs/care consideration BiPAP/CPAP: No CPM: No Continuous Drip IV: No Dialysis: No         Life Vest: No Oxygen: No Special Bed: No Trach Size: No Wound Vac (area): No       Skin: Ecchymosis to right and left arms                                Bowel mgmt: Continent 01/14/17 Bladder mgmt: Continent intermittently  Diabetic mgmt: No     Previous Home Environment Living Arrangements: Spouse/significant other Available Help at Discharge: Family, Available 24 hours/day Type of Home: Apartment Home Layout: One level Home  Access: Level entry Bathroom Shower/Tub: Tub/shower unit Home Care Services: No Additional Comments: Majority of home information taken from previous admission and pt's husband at end of session on his arrival.   Discharge Living Setting Plans for Discharge Living Setting: Lives with (comment) (Spouse ) Type of Home at Discharge: Apartment Discharge Home Layout: One level Discharge Home Access: Level entry Discharge Bathroom Shower/Tub: Tub/shower unit Discharge Bathroom Toilet: Standard Discharge Bathroom Accessibility: Yes How Accessible: Accessible via walker Does the patient have any problems obtaining your medications?: No  Social/Family/Support Systems Patient Roles: Spouse, Parent Contact Information: "Leisl Spurrier - husband - [phone] Anticipated Caregiver: Spouse  Anticipated Caregiver's Contact Information: see above  Ability/Limitations of Caregiver: Husband is retired and can Financial trader Availability: 24/7 Discharge Plan Discussed with Primary Caregiver: Yes Is Caregiver In Agreement with Plan?: Yes Does Caregiver/Family have Issues with Lodging/Transportation while Pt is in Rehab?: No  Goals/Additional Needs Patient/Family Goal for Rehab: PT Min assist; OT Min-Mod assist; SLP Supervision  Expected length of stay: 7-11 days  Cultural Considerations: [employer] Dietary Needs: Dys.3 textures and nectar-thick liquids  Equipment Needs: TBD Special Service Needs: None Additional Information: N/A Pt/Family Agrees to Admission and willing to participate: Yes Program Orientation Provided & Reviewed with Pt/Caregiver Including Roles  & Responsibilities: Yes Additional Information Needs: Patient and spouse will require more education for medication management and specific directions for hydration. Information Needs to be Provided By: Team   Decrease burden of Care through IP rehab admission: No  Possible need for SNF placement upon discharge: No  Patient  Condition: This patient's medical and functional status has changed since the consult dated 01/13/17 in which the Rehabilitation Physician determined and documented that the patient was potentially appropriate for intensive rehabilitative care in an inpatient rehabilitation facility. Issues have been addressed and update has been discussed with Dr. Posey Pronto and Dr. Letta Pate and patient now appropriate for inpatient rehabilitation. Will admit to inpatient rehab today.   Preadmission Screen Completed By:  Gunnar Fusi, 01/14/2017 4:47 PM ______________________________________________________________________   Discussed status with Dr. Letta Pate on 01/14/17 at 1430 and received telephone approval for admission today.  Admission Coordinator:  Gunnar Fusi, time 1430/Date 01/14/17

## 2017-01-15 ENCOUNTER — Telehealth: Payer: Self-pay | Admitting: *Deleted

## 2017-01-15 ENCOUNTER — Inpatient Hospital Stay (HOSPITAL_COMMUNITY): Payer: Medicare Other | Admitting: Speech Pathology

## 2017-01-15 ENCOUNTER — Inpatient Hospital Stay (HOSPITAL_COMMUNITY): Payer: Medicare Other | Admitting: Physical Therapy

## 2017-01-15 ENCOUNTER — Inpatient Hospital Stay (HOSPITAL_COMMUNITY): Payer: Medicare Other

## 2017-01-15 ENCOUNTER — Inpatient Hospital Stay (HOSPITAL_COMMUNITY): Payer: Medicare Other | Admitting: Occupational Therapy

## 2017-01-15 ENCOUNTER — Encounter (HOSPITAL_COMMUNITY): Payer: Self-pay | Admitting: Neurology

## 2017-01-15 DIAGNOSIS — D62 Acute posthemorrhagic anemia: Secondary | ICD-10-CM

## 2017-01-15 DIAGNOSIS — I69391 Dysphagia following cerebral infarction: Secondary | ICD-10-CM

## 2017-01-15 DIAGNOSIS — N183 Chronic kidney disease, stage 3 (moderate): Secondary | ICD-10-CM

## 2017-01-15 DIAGNOSIS — E46 Unspecified protein-calorie malnutrition: Secondary | ICD-10-CM

## 2017-01-15 DIAGNOSIS — I10 Essential (primary) hypertension: Secondary | ICD-10-CM

## 2017-01-15 DIAGNOSIS — I63512 Cerebral infarction due to unspecified occlusion or stenosis of left middle cerebral artery: Secondary | ICD-10-CM

## 2017-01-15 LAB — CBC WITH DIFFERENTIAL/PLATELET
BASOS PCT: 0 %
Basophils Absolute: 0 10*3/uL (ref 0.0–0.1)
Eosinophils Absolute: 0.1 10*3/uL (ref 0.0–0.7)
Eosinophils Relative: 2 %
HEMATOCRIT: 27.9 % — AB (ref 36.0–46.0)
Hemoglobin: 8.9 g/dL — ABNORMAL LOW (ref 12.0–15.0)
Lymphocytes Relative: 30 %
Lymphs Abs: 1.7 10*3/uL (ref 0.7–4.0)
MCH: 29.1 pg (ref 26.0–34.0)
MCHC: 31.9 g/dL (ref 30.0–36.0)
MCV: 91.2 fL (ref 78.0–100.0)
MONO ABS: 0.3 10*3/uL (ref 0.1–1.0)
Monocytes Relative: 6 %
NEUTROS ABS: 3.4 10*3/uL (ref 1.7–7.7)
Neutrophils Relative %: 62 %
Platelets: 237 10*3/uL (ref 150–400)
RBC: 3.06 MIL/uL — ABNORMAL LOW (ref 3.87–5.11)
RDW: 13.9 % (ref 11.5–15.5)
WBC: 5.4 10*3/uL (ref 4.0–10.5)

## 2017-01-15 LAB — COMPREHENSIVE METABOLIC PANEL
ALBUMIN: 2.6 g/dL — AB (ref 3.5–5.0)
ALT: 18 U/L (ref 14–54)
ANION GAP: 6 (ref 5–15)
AST: 13 U/L — ABNORMAL LOW (ref 15–41)
Alkaline Phosphatase: 75 U/L (ref 38–126)
BILIRUBIN TOTAL: 0.4 mg/dL (ref 0.3–1.2)
BUN: 15 mg/dL (ref 6–20)
CO2: 24 mmol/L (ref 22–32)
Calcium: 8.3 mg/dL — ABNORMAL LOW (ref 8.9–10.3)
Chloride: 113 mmol/L — ABNORMAL HIGH (ref 101–111)
Creatinine, Ser: 1.17 mg/dL — ABNORMAL HIGH (ref 0.44–1.00)
GFR calc Af Amer: 51 mL/min — ABNORMAL LOW (ref >60)
GFR, EST NON AFRICAN AMERICAN: 44 mL/min — AB (ref >60)
Glucose, Bld: 107 mg/dL — ABNORMAL HIGH (ref 65–99)
POTASSIUM: 3.5 mmol/L (ref 3.5–5.1)
Sodium: 143 mmol/L (ref 135–145)
TOTAL PROTEIN: 5.3 g/dL — AB (ref 6.5–8.1)

## 2017-01-15 MED ORDER — CALCIUM POLYCARBOPHIL 625 MG PO TABS
1250.0000 mg | ORAL_TABLET | Freq: Every day | ORAL | Status: DC
  Administered 2017-01-15 – 2017-01-19 (×5): 1250 mg via ORAL
  Filled 2017-01-15 (×5): qty 2

## 2017-01-15 MED ORDER — PRO-STAT SUGAR FREE PO LIQD
30.0000 mL | Freq: Two times a day (BID) | ORAL | Status: DC
  Administered 2017-01-15 – 2017-01-19 (×7): 30 mL via ORAL
  Filled 2017-01-15 (×9): qty 30

## 2017-01-15 NOTE — Evaluation (Signed)
Occupational Therapy Assessment and Plan  Patient Details  Name: Valerie Mcclure MRN: [id number] Date of Birth: 04/28/1941  OT Diagnosis: cognitive deficits and hemiplegia affecting dominant side Rehab Potential: Rehab Potential (ACUTE ONLY): Good ELOS: ~3-5 days   Today's Date: 01/15/2017 OT Individual Time: 0930-1030 OT Individual Time Calculation (min): 60 min     Problem List:  Patient Active Problem List   Diagnosis Date Noted  . Physical deconditioning   . Aphasia as late effect of cerebrovascular accident (CVA)   . AKI (acute kidney injury) ([place])   . PAF (paroxysmal atrial fibrillation) ([place])   . Acute kidney failure ([place]) 01/11/2017  . Chronic diastolic heart failure ([place])   . Dysarthria, post-stroke   . Hypoalbuminemia due to protein-calorie malnutrition ([place])   . Global aphasia   . Encephalopathy 12/23/2016  . Acute on chronic diastolic heart failure ([place])   . Acute blood loss anemia   . Tachypnea   . Toxic encephalopathy 12/22/2016  . Atrial flutter ([place]) 12/22/2016  . Hypokalemia 12/21/2016  . Obesity (BMI 30-39.9) 12/21/2016  . Narcotic overdose 12/19/2016  . Spastic hemiplegia of right dominant side as late effect of cerebral infarction ([place])   . E-coli UTI   . Anemia of chronic disease   . Benign essential HTN   . Dysphagia, post-stroke   . Non-fluent aphasia   . Anterior cerebral circulation hemorrhagic infarction ([place]) 12/03/2016  . PAC (premature atrial contraction) 12/03/2016  . Acute ischemic left MCA stroke ([place]) 12/03/2016  . Aphasia as late effect of stroke 12/03/2016  . Right hemiparesis ([place])   . Nontraumatic subcortical hemorrhage of left cerebral hemisphere ([place])   . Acute embolic stroke ([place]) 19/37/9024  . Mild aortic regurgitation 08/20/2016  . Bilateral edema of lower extremity 04/07/2016  . Pancreatic duct dilated 01/09/2016  . CKD (chronic kidney disease) 12/25/2015  . Mild tricuspid regurgitation 12/25/2015  . Mild mitral  regurgitation 12/25/2015  . Prediabetes 06/10/2015  . Edema 06/04/2015  . Abdominal pain, chronic, right upper quadrant 06/04/2015  . Postmenopausal disorder 04/03/2014  . Chronic low back pain   . PAIN IN THORACIC SPINE 04/18/2010  . Coronary atherosclerosis 01/24/2009  . DEGENERATIVE JOINT DISEASE 01/23/2009  . ARTHRITIS, LEFT KNEE 12/13/2008  . HERNIATED LUMBAR DISC 12/13/2008  . Hypothyroidism 11/21/2007  . Hyperlipidemia 11/09/2007  . ANEMIA 11/09/2007  . Essential hypertension 11/09/2007  . Osteopenia 11/09/2007    Past Medical History:  Past Medical History:  Diagnosis Date  . Blood transfusion without reported diagnosis   . Chronic low back pain   . Hyperlipidemia   . Osteopenia   . Unspecified essential hypertension   . Unspecified hypothyroidism    Past Surgical History:  Past Surgical History:  Procedure Laterality Date  . ABDOMINAL HYSTERECTOMY  1970  . CHOLECYSTECTOMY  07/2009   Dr. Rise Patience  . COLONOSCOPY  2003  . FLEXIBLE SIGMOIDOSCOPY  2010  . HAND SURGERY    . IR ANGIO INTRA EXTRACRAN SEL COM CAROTID INNOMINATE UNI L MOD SED  11/29/2016  . IR ANGIO VERTEBRAL SEL SUBCLAVIAN INNOMINATE UNI R MOD SED  11/29/2016  . IR PERCUTANEOUS ART THROMBECTOMY/INFUSION INTRACRANIAL INC DIAG ANGIO  11/29/2016  . LUMBAR LAMINECTOMY  11/2008   Done by Dr. Patrice Paradise  . RADIOLOGY WITH ANESTHESIA N/A 11/29/2016   Procedure: RADIOLOGY WITH ANESTHESIA;  Surgeon: Radiologist, Medication, MD;  Location: [place];  Service: Radiology;  Laterality: N/A;  . THYROIDECTOMY      Assessment & Plan Clinical Impression: Patient is a  76 y.o. year old female  history of chronic LBP, CKD, L-MCA infarct s/p revascularization with hemorrhagic transformation with resultant right sided weakness, aphasia and inattention and new diagnosis of AFib. She underwent CIR stay complicated by encephalopathy due to narcotics (discontiued) and was discharged to home 6/21 at min assist level. She was readmitted  01/11/17 with increase in aphasia and worsening of right sided weakness. She had not received Eliquis X 2 days at home and MS contin had been resumed. Daughters expressed concerns about safety at home with illicit drug use and heroin overdose by a family member recently. UDS positive for opiates. She was started on IV fluid for hydration and MRI brain done showing interval acute/early subacute infarct in left caudate extending to periventricular and left medial thalamus, left anterior temporal lobe encephalomalacia, decrease in size of left basal ganglia subacute hematoma. TPA not administered due to recent ICH. Dr. [name] recommended continuing Eliquis and compliance with antithrombotic medications.   MBS done to evaluate swallow and patient on dysphagia 2, nectar liquids due to dysphagia and aspiration risk. Cognitive evaluation done revealing perseverative language with moderate expressive aphasia and comprehension 60% intact for biographic and Y/N questions.Patient transferred to CIR on 01/14/2017 .    Patient currently requires min to mod A with basic self-care skills secondary to muscle weakness, decreased cardiorespiratoy endurance, impaired timing and sequencing, unbalanced muscle activation, decreased coordination and decreased motor planning, decreased attention to right, decreased attention, decreased awareness and decreased problem solving and decreased standing balance, hemiplegia and decreased balance strategies.  Prior to hospitalization, patient could complete AD: with min from husband  Patient will benefit from skilled intervention to decrease level of assist with basic self-care skills and increase independence with basic self-care skills prior to discharge home with care partner.  Anticipate patient will require minimal physical assistance and follow up home health.  OT - End of Session Activity Tolerance: Tolerates 30+ min activity with multiple rests Endurance Deficit: Yes Endurance  Deficit Description: rest breaks required throughout session OT Assessment Rehab Potential (ACUTE ONLY): Good OT Patient demonstrates impairments in the following area(s): Balance;Behavior;Safety;Cognition;Edema;Endurance;Motor;Pain;Perception;Vision OT Basic ADL's Functional Problem(s): Grooming;Bathing;Dressing;Toileting OT Transfers Functional Problem(s): Toilet;Tub/Shower OT Additional Impairment(s): Fuctional Use of Upper Extremity OT Plan OT Intensity: Minimum of 1-2 x/day, 45 to 90 minutes OT Frequency: 5 out of 7 days OT Duration/Estimated Length of Stay: ~3-5 days OT Treatment/Interventions: Balance/vestibular training;Cognitive remediation/compensation;Community reintegration;Discharge planning;Disease mangement/prevention;DME/adaptive equipment instruction;Functional electrical stimulation;Functional mobility training;Neuromuscular re-education;Pain management;Patient/family education;Psychosocial support;Self Care/advanced ADL retraining;Skin care/wound managment;Splinting/orthotics;Therapeutic Activities;Therapeutic Exercise;UE/LE Strength taining/ROM;UE/LE Coordination activities;Visual/perceptual remediation/compensation;Wheelchair propulsion/positioning OT Self Feeding Anticipated Outcome(s): setup  OT Basic Self-Care Anticipated Outcome(s): supervision - min A  OT Toileting Anticipated Outcome(s): min A  OT Bathroom Transfers Anticipated Outcome(s): min A  OT Recommendation Patient destination: Home Follow Up Recommendations: Home health OT;24 hour supervision/assistance Equipment Recommended: 3 in 1 bedside comode;Tub/shower bench   Skilled Therapeutic Intervention 1:1 self care retraining with OT purpose, goals, and role discussed. Pt performed transfers min to mod A stand pivot transfers. Pt incontinent 3x of bowel movement. Showered sit to stand on [facility]. Pt's mobility and functional ADLs today is very similar to her last admission (at discharge)- leading to a shorter LOS  here in CIR.  Performed transferred into hemi height w/c for improved functional mobility at w/c level.  LKeft in room with safety belt donned.    OT Evaluation Precautions/Restrictions  Precautions Precautions: Fall Precaution Comments: R sided weakness, R inattention Restrictions Weight Bearing Restrictions: No General Chart Reviewed:  Yes Family/Caregiver Present: No Vital Signs  Pain   Home Living/Prior Functioning Home Living Family/patient expects to be discharged to:: Private residence Living Arrangements: Spouse/significant other, Children Available Help at Discharge: Family, Available 24 hours/day Type of Home: Apartment Home Access: Level entry Home Layout: One level Bathroom Shower/Tub: Tub/shower unit  Lives With: Spouse Prior Function Level of Independence: Independent with basic ADLs, Needs assistance with homemaking, Needs assistance with gait  Able to Take Stairs?: Yes Driving: Yes Vocation: Retired Comments: enjoys spending time with grandchildren, gardening ADL ADL ADL Comments: see functional navigator Vision Baseline Vision/History: Wears glasses Wears Glasses: Reading only Patient Visual Report: No change from baseline Eye Alignment: Within Functional Limits Perception  Perception: Impaired Inattention/Neglect: Does not attend to right visual field;Does not attend to right side of body Praxis Praxis: Impaired Praxis Impairment Details: Motor planning Cognition Overall Cognitive Status: History of cognitive impairments - at baseline Arousal/Alertness: Awake/alert Orientation Level: Person;Place Person: Oriented Place: Oriented Situation:  (unable to full assess due to aphasia) Year:  (19...) Month: June Day of Week: Incorrect (thursday) Memory Impairment: Decreased recall of new information Immediate Memory Recall: Bed (without cues) Memory Recall:  (difficult due to aphasia) Attention: Sustained Sustained Attention: Appears  intact Selective Attention: Appears intact Awareness: Impaired Awareness Impairment: Intellectual impairment;Emergent impairment Problem Solving: Impaired Problem Solving Impairment: Functional basic Executive Function:  (all impaired) Behaviors: Poor frustration tolerance Safety/Judgment: Impaired Sensation Sensation Light Touch: Impaired by gross assessment Light Touch Impaired Details: Impaired RUE;Impaired RLE Stereognosis: Impaired Detail Hot/Cold: Impaired by gross assessment Proprioception: Impaired Detail Proprioception Impaired Details: Impaired RUE Coordination Gross Motor Movements are Fluid and Coordinated: No Fine Motor Movements are Fluid and Coordinated: No Coordination and Movement Description: decreased gross coordination through Rt UE/LE.  Finger Nose Finger Test: unable to complete on Rt side Heel Shin Test: unable to complete test with Rt LE Motor  Motor Motor: Hemiplegia Motor - Skilled Clinical Observations: Rt hemiplegia ongoing Mobility  Transfers Transfers: Sit to Stand;Stand to Sit Sit to Stand: 4: Min assist Sit to Stand Details: Verbal cues for sequencing Stand to Sit: 4: Min assist  Trunk/Postural Assessment  Cervical Assessment Cervical Assessment: Within Functional Limits Thoracic Assessment Thoracic Assessment:  (mild kyphosis) Lumbar Assessment Lumbar Assessment: Within Functional Limits Postural Control Postural Control: Deficits on evaluation Righting Reactions: delayed  Balance Balance Balance Assessed: Yes Static Sitting Balance Static Sitting - Balance Support: Feet supported Static Sitting - Level of Assistance: 5: Stand by assistance Dynamic Sitting Balance Dynamic Sitting - Balance Support: Feet supported Dynamic Sitting - Level of Assistance: 5: Stand by assistance Static Standing Balance Static Standing - Balance Support: Left upper extremity supported Static Standing - Level of Assistance: 4: Min assist Dynamic  Standing Balance Dynamic Standing - Balance Support: Left upper extremity supported Dynamic Standing - Level of Assistance: 3: Mod assist Extremity/Trunk Assessment RUE Assessment RUE Assessment: Exceptions to [facility] RUE AROM (degrees) RUE Overall AROM Comments: WFL range against gravity  RUE Strength RUE Overall Strength Comments: grossly decreased strength throughout RUE Tone RUE Tone: Modified Ashworth Modified Ashworth Scale for Grading Hypertonia RUE: Slight increase in muscle tone, manifested by a catch and release or by minimal resistance at the end of the range of motion when the affected part(s) is moved in flexion or extension LUE Assessment LUE Assessment: Within Functional Limits   See Function Navigator for Current Functional Status.   Refer to Care Plan for Long Term Goals  Recommendations for other services: None    Discharge Criteria:  Patient will be discharged from OT if patient refuses treatment 3 consecutive times without medical reason, if treatment goals not met, if there is a change in medical status, if patient makes no progress towards goals or if patient is discharged from hospital.  The above assessment, treatment plan, treatment alternatives and goals were discussed and mutually agreed upon: by patient  Nicoletta Ba 01/15/2017, 10:53 AM

## 2017-01-15 NOTE — Evaluation (Signed)
Speech Language Pathology Assessment and Plan  Patient Details  Name: Valerie Mcclure MRN: [id number] Date of Birth: [birth year]  SLP Diagnosis: Aphasia;Dysphagia;Cognitive Impairments;Apraxia  Rehab Potential: Good ELOS: 3 to 5 days    Today's Date: 01/15/2017 SLP Individual Time: 1500-1600 SLP Individual Time Calculation (min): 60 min   Problem List:  Patient Active Problem List   Diagnosis Date Noted  . Physical deconditioning   . Aphasia as late effect of cerebrovascular accident (CVA)   . AKI (acute kidney injury) ([place])   . PAF (paroxysmal atrial fibrillation) ([place])   . Acute kidney failure ([place]) 01/11/2017  . Chronic diastolic heart failure ([place])   . Dysarthria, post-stroke   . Hypoalbuminemia due to protein-calorie malnutrition ([place])   . Global aphasia   . Encephalopathy 12/23/2016  . Acute on chronic diastolic heart failure ([place])   . Acute blood loss anemia   . Tachypnea   . Toxic encephalopathy 12/22/2016  . Atrial flutter ([place]) 12/22/2016  . Hypokalemia 12/21/2016  . Obesity (BMI 30-39.9) 12/21/2016  . Narcotic overdose 12/19/2016  . Spastic hemiplegia of right dominant side as late effect of cerebral infarction ([place])   . E-coli UTI   . Anemia of chronic disease   . Benign essential HTN   . Dysphagia, post-stroke   . Non-fluent aphasia   . Anterior cerebral circulation hemorrhagic infarction ([place]) 12/03/2016  . PAC (premature atrial contraction) 12/03/2016  . Acute ischemic left MCA stroke ([place]) 12/03/2016  . Aphasia as late effect of stroke 12/03/2016  . Right hemiparesis ([place])   . Nontraumatic subcortical hemorrhage of left cerebral hemisphere ([place])   . Acute embolic stroke ([place]) 59/56/3875  . Mild aortic regurgitation 08/20/2016  . Bilateral edema of lower extremity 04/07/2016  . Pancreatic duct dilated 01/09/2016  . CKD (chronic kidney disease) 12/25/2015  . Mild tricuspid regurgitation 12/25/2015  . Mild mitral regurgitation 12/25/2015  .  Prediabetes 06/10/2015  . Edema 06/04/2015  . Abdominal pain, chronic, right upper quadrant 06/04/2015  . Postmenopausal disorder 04/03/2014  . Chronic low back pain   . PAIN IN THORACIC SPINE 04/18/2010  . Coronary atherosclerosis 01/24/2009  . DEGENERATIVE JOINT DISEASE 01/23/2009  . ARTHRITIS, LEFT KNEE 12/13/2008  . HERNIATED LUMBAR DISC 12/13/2008  . Hypothyroidism 11/21/2007  . Hyperlipidemia 11/09/2007  . ANEMIA 11/09/2007  . Essential hypertension 11/09/2007  . Osteopenia 11/09/2007   Past Medical History:  Past Medical History:  Diagnosis Date  . Blood transfusion without reported diagnosis   . Chronic low back pain   . Hyperlipidemia   . Osteopenia   . Unspecified essential hypertension   . Unspecified hypothyroidism    Past Surgical History:  Past Surgical History:  Procedure Laterality Date  . ABDOMINAL HYSTERECTOMY  1970  . CHOLECYSTECTOMY  07/2009   Dr. Rise Patience  . COLONOSCOPY  2003  . FLEXIBLE SIGMOIDOSCOPY  2010  . HAND SURGERY    . IR ANGIO INTRA EXTRACRAN SEL COM CAROTID INNOMINATE UNI L MOD SED  11/29/2016  . IR ANGIO VERTEBRAL SEL SUBCLAVIAN INNOMINATE UNI R MOD SED  11/29/2016  . IR PERCUTANEOUS ART THROMBECTOMY/INFUSION INTRACRANIAL INC DIAG ANGIO  11/29/2016  . LUMBAR LAMINECTOMY  11/2008   Done by Dr. Patrice Paradise  . RADIOLOGY WITH ANESTHESIA N/A 11/29/2016   Procedure: RADIOLOGY WITH ANESTHESIA;  Surgeon: Radiologist, Medication, MD;  Location: [place];  Service: Radiology;  Laterality: N/A;  . THYROIDECTOMY      Assessment / Plan / Recommendation Clinical Impression Valerie Mcclure a 76 y.o.femalewith history of  chronic LBP, CKD, L-MCA infarct s/p revascularization with hemorrhagic transformation with resultant right sided weakness, aphasia and inattention and new diagnosis of AFib. She underwent CIR stay complicated by encephalopathy due to narcotics (discontiued) and was discharged to home 6/21 at min assist level. She was readmitted 01/11/17 with  increase in aphasia and worsening of right sided weakness. She had not received Eliquis X 2 days at home and MS contin had been resumed. Daughters expressed concerns about safety at home with illicit drug use and heroin overdose by a family member recently. UDS positive for opiates. She was started on IV fluid for hydration and MRI brain done showing interval acute/early subacute infarct in left caudate extending to periventricular and left medial thalamus, left anterior temporal lobe encephalomalacia, decrease in size of left basal ganglia subacute hematoma. TPA not administered due to recent ICH. Dr. [name] recommended continuing Eliquis and compliance with antithrombotic medications. MBS done to evaluate swallow and patient on dysphagia 3, nectar liquids due to dysphagia and aspiration risk. Cognitive evaluation done revealing perseverative language with moderate expressive aphasia and comprehension 60% intact for biographic and Y/N questions. Therapy initiated and patient limited by decrease in RUE motor planning, decreased balance, aphasia, RLE ataxia and weakness causing decline in mobility and ability to carry out ADL tasks. CIR recommended for follow up therapy. Pt admited to CIR on 01/14/17.   Speech-language and bedside swallow evaluations completed on 01/15/17. Pt presents with moderate expressive aphasia c/b semantic paraphasias and perseverative language further complicated by apraxia of speech. Pt able to communicate intermittent wants and needs when supplemented by gestures and nonverbals. Pt also presents with mild to moderate dysphagia and consumed dysphagia 3 snack with necatr thick liquids without overt s/s of aspiration. Skilled ST is required to education husband on thickening liquids and facilitating discharge plan that helps pt with communicate to increase functional independence. Anticipate that pt will need 24 hour supervision and follow up ST at discharge.     Skilled Therapeutic  Interventions          Skilled treatment session focused on completion of bedside swallow and speech-language evaluations, see above. Pt with increased verbal communication when presented with sentence completion tasks.    SLP Assessment  Patient will need skilled Speech Lanaguage Pathology Services during CIR admission    Recommendations  SLP Diet Recommendations: Dysphagia 3 (Mech soft);Nectar Liquid Administration via: Cup Medication Administration: Whole meds with puree Supervision: Patient able to self feed;Full supervision/cueing for compensatory strategies Compensations: Slow rate;Small sips/bites;Minimize environmental distractions;Multiple dry swallows after each bite/sip Postural Changes and/or Swallow Maneuvers: Seated upright 90 degrees;Upright 30-60 min after meal Oral Care Recommendations: Oral care BID Patient destination: Home Follow up Recommendations: Home Health SLP;Outpatient SLP;24 hour supervision/assistance Equipment Recommended: None recommended by SLP    SLP Frequency 3 to 5 out of 7 days   SLP Duration  SLP Intensity  SLP Treatment/Interventions 3 to 5 days  Minumum of 1-2 x/day, 30 to 90 minutes  Cognitive remediation/compensation;Cueing hierarchy;Dysphagia/aspiration precaution training;Environmental controls;Functional tasks;Internal/external aids;Patient/family education;Speech/Language facilitation    Pain    Prior Functioning Cognitive/Linguistic Baseline: Baseline deficits Baseline deficit details:  Min to Mod expressive > receptive aphasia Type of Home: Apartment  Lives With: Spouse Available Help at Discharge: Family;Available 24 hours/day Vocation: Retired  Function:  Eating Eating   Modified Consistency Diet: Yes Eating Assist Level: Set up assist for;Supervision or verbal cues   Eating Set Up Assist For: Opening containers Helper Scoops Food on Utensil: Occasionally  Cognition Comprehension Comprehension assist level:  Understands basic 90% of the time/cues < 10% of the time  Expression   Expression assist level: Expresses basic 50 - 74% of the time/requires cueing 25 - 49% of the time. Needs to repeat parts of sentences.  Social Interaction Social Interaction assist level: Interacts appropriately 90% of the time - Needs monitoring or encouragement for participation or interaction.  Problem Solving Problem solving assist level: Solves basic 50 - 74% of the time/requires cueing 25 - 49% of the time  Memory Memory assist level: Recognizes or recalls 50 - 74% of the time/requires cueing 25 - 49% of the time   Short Term Goals: Week 1: SLP Short Term Goal 1 (Week 1): STGS= LTGS d/t ELOS  Refer to Care Plan for Long Term Goals  Recommendations for other services: None   Discharge Criteria: Patient will be discharged from SLP if patient refuses treatment 3 consecutive times without medical reason, if treatment goals not met, if there is a change in medical status, if patient makes no progress towards goals or if patient is discharged from hospital.  The above assessment, treatment plan, treatment alternatives and goals were discussed and mutually agreed upon: by patient  Javien Tesch 01/15/2017, 4:54 PM

## 2017-01-15 NOTE — Progress Notes (Signed)
Social Work  Social Work Assessment and Plan  Patient Details  Name: Valerie Mcclure MRN: [id number] Date of Birth: 14-Jun-1941  Today's Date: 01/15/2017  Problem List:  Patient Active Problem List   Diagnosis Date Noted  . Hyperglycemia   . Physical deconditioning   . Aphasia as late effect of cerebrovascular accident (CVA)   . AKI (acute kidney injury) ([place])   . PAF (paroxysmal atrial fibrillation) ([place])   . Acute kidney failure ([place]) 01/11/2017  . Chronic diastolic heart failure ([place])   . Dysarthria, post-stroke   . Hypoalbuminemia due to protein-calorie malnutrition ([place])   . Global aphasia   . Encephalopathy 12/23/2016  . Acute on chronic diastolic heart failure ([place])   . Acute blood loss anemia   . Tachypnea   . Toxic encephalopathy 12/22/2016  . Atrial flutter ([place]) 12/22/2016  . Hypokalemia 12/21/2016  . Obesity (BMI 30-39.9) 12/21/2016  . Narcotic overdose 12/19/2016  . Spastic hemiplegia of right dominant side as late effect of cerebral infarction ([place])   . E-coli UTI   . Anemia of chronic disease   . Benign essential HTN   . Dysphagia, post-stroke   . Non-fluent aphasia   . Anterior cerebral circulation hemorrhagic infarction ([place]) 12/03/2016  . PAC (premature atrial contraction) 12/03/2016  . Acute ischemic left MCA stroke ([place]) 12/03/2016  . Aphasia as late effect of stroke 12/03/2016  . Right hemiparesis ([place])   . Nontraumatic subcortical hemorrhage of left cerebral hemisphere ([place])   . Acute embolic stroke ([place]) 05/39/7673  . Mild aortic regurgitation 08/20/2016  . Bilateral edema of lower extremity 04/07/2016  . Pancreatic duct dilated 01/09/2016  . CKD (chronic kidney disease) 12/25/2015  . Mild tricuspid regurgitation 12/25/2015  . Mild mitral regurgitation 12/25/2015  . Prediabetes 06/10/2015  . Edema 06/04/2015  . Abdominal pain, chronic, right upper quadrant 06/04/2015  . Postmenopausal disorder 04/03/2014  . Chronic low back pain   . PAIN  IN THORACIC SPINE 04/18/2010  . Coronary atherosclerosis 01/24/2009  . DEGENERATIVE JOINT DISEASE 01/23/2009  . ARTHRITIS, LEFT KNEE 12/13/2008  . HERNIATED LUMBAR DISC 12/13/2008  . Hypothyroidism 11/21/2007  . Hyperlipidemia 11/09/2007  . ANEMIA 11/09/2007  . Essential hypertension 11/09/2007  . Osteopenia 11/09/2007   Past Medical History:  Past Medical History:  Diagnosis Date  . Blood transfusion without reported diagnosis   . Chronic low back pain   . Hyperlipidemia   . Osteopenia   . Unspecified essential hypertension   . Unspecified hypothyroidism    Past Surgical History:  Past Surgical History:  Procedure Laterality Date  . ABDOMINAL HYSTERECTOMY  1970  . CHOLECYSTECTOMY  07/2009   Dr. Rise Patience  . COLONOSCOPY  2003  . FLEXIBLE SIGMOIDOSCOPY  2010  . HAND SURGERY    . IR ANGIO INTRA EXTRACRAN SEL COM CAROTID INNOMINATE UNI L MOD SED  11/29/2016  . IR ANGIO VERTEBRAL SEL SUBCLAVIAN INNOMINATE UNI R MOD SED  11/29/2016  . IR PERCUTANEOUS ART THROMBECTOMY/INFUSION INTRACRANIAL INC DIAG ANGIO  11/29/2016  . LUMBAR LAMINECTOMY  11/2008   Done by Dr. Patrice Paradise  . RADIOLOGY WITH ANESTHESIA N/A 11/29/2016   Procedure: RADIOLOGY WITH ANESTHESIA;  Surgeon: Radiologist, Medication, MD;  Location: [facility];  Service: Radiology;  Laterality: N/A;  . THYROIDECTOMY     Social History:  reports that she has never smoked. She has never used smokeless tobacco. She reports that she does not drink alcohol or use drugs.  Family / Support Systems Marital Status: Married How Long?: 23 yrs (  2nd marriage for both) Patient Roles: Spouse, Parent Spouse/Significant Other: Sherrita Riederer @ [phone] Children: daughter, Wallis Mart [facility]) @ (C) [phone];  daughter, [name] [facility]) @ (C) [phone]; step-daughter, Vergia Alberts @ [phone] Other Supports: extended family in area Anticipated Caregiver: Spouse  Ability/Limitations of Caregiver: Husband is retired and can  Financial trader Availability: 24/7 Family Dynamics: Pt reports that husband remains supportive and daughter, [name] also very involved.  Social History Preferred language: English Religion: Baptist Cultural Background: NA Read: Yes Write: Yes Employment Status: Retired Freight forwarder Issues: None Guardian/Conservator: None - per MD, pt is not fully capable of making decisions on her own behalf - defer to spouse   Abuse/Neglect Physical Abuse: Denies Verbal Abuse: Denies Sexual Abuse: Denies Exploitation of patient/patient's resources: Denies Self-Neglect: Denies  Emotional Status Pt's affect, behavior adn adjustment status: Pt smiling when I entered room and reports she is happy to be back on rehab unit, however, frustrated with quick re-admit.  She is very familiar with rehab process.  Denies any significant emotional distress and hopeful her LOS will be short. Recent Psychosocial Issues: Husband recently had a heart attack but recovering well. Pyschiatric History: None Substance Abuse History: SA hx for another family member  Patient / Family Perceptions, Expectations & Goals Pt/Family understanding of illness & functional limitations: Pt and family familiar with CIR and understanding that she hashad an extension of stroke. Premorbid pt/family roles/activities: Pt was completely independent and active home and in the community prior to 1st CVA.  FAmily providing 24/7 support since first CIR d/c Anticipated changes in roles/activities/participation: Initial tx goals being set for minimal assistance - husband and family will need to assume some hands on caregiver roles as they are able. Pt/family expectations/goals: Pt states desire to "do for myself..."  [employer]: None Premorbid Home Care/DME Agencies: Other (Comment) ([facility]) Transportation available at discharge: yes Resource referrals recommended:  Neuropsychology  Discharge Planning Living Arrangements: Spouse/significant other, Children Support Systems: Spouse/significant other, Children, Other relatives Type of Residence: Private residence Insurance Resources: [employer] Resources: Radio broadcast assistant Screen Referred: No Living Expenses: Education officer, community Management: Spouse Does the patient have any problems obtaining your medications?: No Home Management: pt and spouse Patient/Family Preliminary Plans: Pt to d/c home with elderly spouse with recent health issues.  Family able to provide added support for them both. Social Work Anticipated Follow Up Needs: HH/OP Expected length of stay: 5-7 days  Clinical Impression Very pleasant woman familiar to CIR with very recent d/c following a CVA.  Now with extension of stroke and needing a short LOS on CIR again.  Family has been providing 24/7 assistance and will resume.  Pt pleased with return to CIR, however, eager to get home ASAP.  Will follow for support and restart of [place] services.  Dashawn Bartnick 01/15/2017, 4:06 PM

## 2017-01-15 NOTE — Progress Notes (Signed)
Occupational Therapy Session Note  Patient Details  Name: TIFFAY PINETTE MRN: [id number] Date of Birth: 11-12-1940  Today's Date: 01/15/2017 OT Individual Time: 1430-1457 OT Individual Time Calculation (min): 27 min    Short Term Goals: Week 1:  OT Short Term Goal 1 (Week 1): LTG=SSTG  Skilled Therapeutic Interventions/Progress Updates:    1;1. Focus of session on functional mobility, functional use of RUE, and endurance. Without AD, Pt ambulates with min-mod A for balance and VC for posture around bed. Pt makes bed stripping fitted sheet and placing new sheets with VC for orientation of sheet, mod cueing for use of RUE and min HOH A of RUE for smooth movement patterns. Pt has 2 LOB to R with MOD A to correct. Pt requires 1 seated rest break. Exited session with pt seated in w/ with QRB donned and call light in reach.   Therapy Documentation Precautions:  Precautions Precautions: Fall Precaution Comments: R sided weakness, R inattention Restrictions Weight Bearing Restrictions: No Praxis Praxis: Impaired Praxis Impairment Details: Motor planning  See Function Navigator for Current Functional Status.   Therapy/Group: Individual Therapy  Tonny Branch 01/15/2017, 3:01 PM

## 2017-01-15 NOTE — Progress Notes (Signed)
Enteric precautions started, family educated. Sample not obtained as of yet and family notified that sample not obtained that must wear gown and gloves until confirmed and will readdress when results received.

## 2017-01-15 NOTE — Progress Notes (Signed)
Physical Therapy Assessment and Plan  Patient Details  Name: Valerie Mcclure MRN: [id number] Date of Birth: Dec 11, 1940  PT Diagnosis: Abnormality of gait, Coordination disorder, Difficulty walking, Hemiparesis dominant, Impaired cognition, Impaired sensation and Muscle weakness Rehab Potential: Good ELOS:  (3-5 days)   Today's Date: 01/15/2017 PT Individual Time: 4536-4680      Problem List:  Patient Active Problem List   Diagnosis Date Noted  . Physical deconditioning   . Aphasia as late effect of cerebrovascular accident (CVA)   . AKI (acute kidney injury) ([place])   . PAF (paroxysmal atrial fibrillation) ([place])   . Acute kidney failure ([place]) 01/11/2017  . Chronic diastolic heart failure ([place])   . Dysarthria, post-stroke   . Hypoalbuminemia due to protein-calorie malnutrition ([place])   . Global aphasia   . Encephalopathy 12/23/2016  . Acute on chronic diastolic heart failure ([place])   . Acute blood loss anemia   . Tachypnea   . Toxic encephalopathy 12/22/2016  . Atrial flutter ([place]) 12/22/2016  . Hypokalemia 12/21/2016  . Obesity (BMI 30-39.9) 12/21/2016  . Narcotic overdose 12/19/2016  . Spastic hemiplegia of right dominant side as late effect of cerebral infarction ([place])   . E-coli UTI   . Anemia of chronic disease   . Benign essential HTN   . Dysphagia, post-stroke   . Non-fluent aphasia   . Anterior cerebral circulation hemorrhagic infarction ([place]) 12/03/2016  . PAC (premature atrial contraction) 12/03/2016  . Acute ischemic left MCA stroke ([place]) 12/03/2016  . Aphasia as late effect of stroke 12/03/2016  . Right hemiparesis ([place])   . Nontraumatic subcortical hemorrhage of left cerebral hemisphere ([place])   . Acute embolic stroke ([place]) 32/06/2481  . Mild aortic regurgitation 08/20/2016  . Bilateral edema of lower extremity 04/07/2016  . Pancreatic duct dilated 01/09/2016  . CKD (chronic kidney disease) 12/25/2015  . Mild tricuspid regurgitation 12/25/2015  . Mild  mitral regurgitation 12/25/2015  . Prediabetes 06/10/2015  . Edema 06/04/2015  . Abdominal pain, chronic, right upper quadrant 06/04/2015  . Postmenopausal disorder 04/03/2014  . Chronic low back pain   . PAIN IN THORACIC SPINE 04/18/2010  . Coronary atherosclerosis 01/24/2009  . DEGENERATIVE JOINT DISEASE 01/23/2009  . ARTHRITIS, LEFT KNEE 12/13/2008  . HERNIATED LUMBAR DISC 12/13/2008  . Hypothyroidism 11/21/2007  . Hyperlipidemia 11/09/2007  . ANEMIA 11/09/2007  . Essential hypertension 11/09/2007  . Osteopenia 11/09/2007    Past Medical History:  Past Medical History:  Diagnosis Date  . Blood transfusion without reported diagnosis   . Chronic low back pain   . Hyperlipidemia   . Osteopenia   . Unspecified essential hypertension   . Unspecified hypothyroidism    Past Surgical History:  Past Surgical History:  Procedure Laterality Date  . ABDOMINAL HYSTERECTOMY  1970  . CHOLECYSTECTOMY  07/2009   Dr. Rise Patience  . COLONOSCOPY  2003  . FLEXIBLE SIGMOIDOSCOPY  2010  . HAND SURGERY    . IR ANGIO INTRA EXTRACRAN SEL COM CAROTID INNOMINATE UNI L MOD SED  11/29/2016  . IR ANGIO VERTEBRAL SEL SUBCLAVIAN INNOMINATE UNI R MOD SED  11/29/2016  . IR PERCUTANEOUS ART THROMBECTOMY/INFUSION INTRACRANIAL INC DIAG ANGIO  11/29/2016  . LUMBAR LAMINECTOMY  11/2008   Done by Dr. Patrice Paradise  . RADIOLOGY WITH ANESTHESIA N/A 11/29/2016   Procedure: RADIOLOGY WITH ANESTHESIA;  Surgeon: Radiologist, Medication, MD;  Location: [facility];  Service: Radiology;  Laterality: N/A;  . THYROIDECTOMY      Assessment & Plan Clinical Impression: Patient is a  76 y.o.femalewith history of chronic LBP, CKD, L-MCA infarct s/p revascularization with hemorrhagic transformation with resultant right sided weakness, aphasia and inattention and new diagnosis of AFib. She underwent CIR stay complicated by encephalopathy due to narcotics (discontiued) and was discharged to home 6/21 at min assist level. She was readmitted  01/11/17 with increase in aphasia and worsening of right sided weakness. She had not received Eliquis X 2 days at home and MS contin had been resumed. Daughters expressed concerns about safety at home with illicit drug use and heroin overdose by a family member recently. UDS positive for opiates. She was started on IV fluid for hydration and MRI brain done showing interval acute/early subacute infarct in left caudate extending to periventricular and left medial thalamus, left anterior temporal lobe encephalomalacia, decrease in size of left basal ganglia subacute hematoma. TPA not administered due to recent ICH. Dr. [name] recommended continuing Eliquis and compliance with antithrombotic medications.   MBS done to evaluate swallow and patient on dysphagia 2, nectar liquids due to dysphagia and aspiration risk. Cognitive evaluation done revealing perseverative language with moderate expressive aphasia and comprehension 60% intact for biographic and Y/N questions. Therapy initiated and patient limited by decrease in RUE motor planning, decreased balance, aphasia, RLE ataxia and weakness causing decline in mobility and ability to carry out ADL tasks. CIR recommended for follow up therapy. Patient transferred to CIR on 01/14/2017 .   Patient currently requires min with mobility secondary to muscle weakness, impaired timing and sequencing, decreased coordination and decreased motor planning, decreased attention to right, decreased awareness, decreased problem solving, decreased safety awareness, decreased memory and delayed processing and decreased sitting balance, decreased standing balance and hemiplegia.  Prior to hospitalization, patient was supervision to min assist with mobility and lived with Spouse in a [place] home.  Home access is  Level entry.  Patient will benefit from skilled PT intervention to maximize safe functional mobility and minimize fall risk for planned discharge home with 24 hour assist.   Anticipate patient will benefit from follow up [facility] at discharge.  PT - End of Session Activity Tolerance: Tolerates 30+ min activity with multiple rests Endurance Deficit: Yes Endurance Deficit Description: rest breaks required throughout session PT Assessment Rehab Potential (ACUTE/IP ONLY): Good PT Plan PT Intensity: Minimum of 1-2 x/day ,45 to 90 minutes PT Frequency: 5 out of 7 days PT Duration Estimated Length of Stay:  (3-5 days) PT Treatment/Interventions: Ambulation/gait training;Balance/vestibular training;Cognitive remediation/compensation;Community reintegration;Discharge planning;Disease management/prevention;DME/adaptive equipment instruction;Functional electrical stimulation;Functional mobility training;Neuromuscular re-education;Patient/family education;Psychosocial support;Splinting/orthotics;Stair training;Therapeutic Activities;Therapeutic Exercise;UE/LE Strength taining/ROM;UE/LE Coordination activities;Visual/perceptual remediation/compensation;Wheelchair propulsion/positioning PT Transfers Anticipated Outcome(s): supervision PT Locomotion Anticipated Outcome(s): ambulatory with min assist PT Recommendation Follow Up Recommendations: Home health PT Patient destination: Home Equipment Recommended: None recommended by PT Equipment Details: pt reports having walker and wheelchair at home  Skilled Therapeutic Intervention Pt in bed upon arrival, agreeable to PT session. Working on bed mobility, transfers and ambulation during session. Pt able to ambulate 8 ft X1, 20 ft X1. Attempted use of rw but pt unable to consistently utilize due pt Rt hand weakness. Performing car transfers as well as stairs during session. Pt educated on general PT goals as well as safety considerations.  PT Evaluation Precautions/Restrictions Precautions Precautions: Fall Precaution Comments: R sided weakness, R inattention Restrictions Weight Bearing Restrictions: No General   Vital  Signs Pain  Pt denies any pain. Home Living/Prior Functioning Home Living Available Help at Discharge: Family;Available 24 hours/day Type of Home: Apartment Home Access: Level entry Home  Layout: One level Bathroom Shower/Tub: Tub/shower unit  Lives With: Spouse Prior Function Level of Independence: Independent with basic ADLs;Needs assistance with homemaking;Needs assistance with gait  Able to Take Stairs?: Yes Driving: Yes Vocation: Retired Comments: enjoys spending time with grandchildren, gardening Vision/Perception  Vision - History Baseline Vision: No visual deficits Patient Visual Report: No change from baseline Vision - Assessment Eye Alignment: Within Functional Limits Perception Perception: Impaired Inattention/Neglect: Does not attend to right visual field;Does not attend to right side of body Praxis Praxis: Impaired Praxis Impairment Details: Motor planning  Cognition Overall Cognitive Status: Difficult to assess Arousal/Alertness: Awake/alert Orientation Level: Oriented to person;Oriented to place;Disoriented to time Attention: Sustained Sustained Attention: Appears intact Selective Attention: Appears intact Memory Impairment: Decreased recall of new information Awareness: Impaired Awareness Impairment: Intellectual impairment;Emergent impairment Problem Solving: Impaired Problem Solving Impairment: Functional basic;Verbal basic Executive Function:  (all impaired) Behaviors: Poor frustration tolerance Safety/Judgment: Impaired Sensation Sensation Light Touch: Impaired by gross assessment Light Touch Impaired Details: Impaired RUE;Impaired RLE Stereognosis: Impaired Detail Hot/Cold: Impaired by gross assessment Proprioception: Appears Intact Proprioception Impaired Details: Impaired RUE Coordination Gross Motor Movements are Fluid and Coordinated: No Fine Motor Movements are Fluid and Coordinated: No Coordination and Movement Description: decreased  gross coordination through [place].  Finger Nose Finger Test: unable to complete on Rt side Heel Shin Test: unable to complete test with Rt LE Motor  Motor Motor: Hemiplegia Motor - Skilled Clinical Observations: Rt hemiplegia  Mobility Transfers Sit to Stand: 4: Min assist Sit to Stand Details: Verbal cues for sequencing Stand to Sit: 4: Min assist    Trunk/Postural Assessment  Cervical Assessment Cervical Assessment: Within Functional Limits Thoracic Assessment Thoracic Assessment:  (mild kyphosis) Lumbar Assessment Lumbar Assessment: Within Functional Limits Postural Control Postural Control: Deficits on evaluation Righting Reactions: delayed  Balance Balance Balance Assessed: Yes Static Sitting Balance Static Sitting - Balance Support: Feet supported Static Sitting - Level of Assistance: 5: Stand by assistance Dynamic Sitting Balance Dynamic Sitting - Balance Support: Feet supported Dynamic Sitting - Level of Assistance: 4: Min assist Static Standing Balance Static Standing - Balance Support: Left upper extremity supported Static Standing - Level of Assistance: 4: Min assist Dynamic Standing Balance Dynamic Standing - Balance Support: Left upper extremity supported Dynamic Standing - Level of Assistance: 3: Mod assist Extremity Assessment  RUE Assessment RUE Assessment: Exceptions to [facility] RUE AROM (degrees) RUE Overall AROM Comments: WFL range against gravity  RUE Strength RUE Overall Strength Comments: grossly decreased strength throughout RUE Tone RUE Tone: Modified Ashworth Modified Ashworth Scale for Grading Hypertonia RUE: Slight increase in muscle tone, manifested by a catch and release or by minimal resistance at the end of the range of motion when the affected part(s) is moved in flexion or extension LUE Assessment LUE Assessment: Within Functional Limits RLE Strength RLE Overall Strength: Deficits RLE Overall Strength Comments: active motion noted  throughout with grossly decreased strength. Right Hip Flexion: 3-/5 Right Knee Extension: 3-/5 Right Ankle Dorsiflexion: 3-/5 Right Ankle Plantar Flexion: 2+/5 LLE Assessment LLE Assessment: Within Functional Limits   See Function Navigator for Current Functional Status.   Refer to Care Plan for Long Term Goals  Recommendations for other services: None   Discharge Criteria: Patient will be discharged from PT if patient refuses treatment 3 consecutive times without medical reason, if treatment goals not met, if there is a change in medical status, if patient makes no progress towards goals or if patient is discharged from hospital.  The above assessment,  treatment plan, treatment alternatives and goals were discussed and mutually agreed upon: by patient  Linard Millers, PT 01/15/2017, 12:43 PM

## 2017-01-15 NOTE — Progress Notes (Signed)
Patient information reviewed and entered into eRehab system by Rhonda Vangieson, RN, CRRN, PPS Coordinator.  Information including medical coding and functional independence measure will be reviewed and updated through discharge.     Per nursing patient was given "Data Collection Information Summary for Patients in Inpatient Rehabilitation Facilities with attached "Privacy Act Statement-Health Care Records" upon admission.  

## 2017-01-15 NOTE — Progress Notes (Signed)
Gunn[facility] Admission Coordinator Signed Physical Medicine and Rehabilitation  PMR Pre-admission Date of Service: 01/14/2017 4:47 PM  Related encounter: ED to Hosp-Admission (Discharged) from 01/11/2017 in [place]       [] Hide copied text PMR Admission Coordinator Pre-Admission Assessment  Patient: Valerie Mcclure is an 76 y.o., female MRN: [id number] DOB: [birth year] Height: 5\' 3"  (160 cm) Weight: 79.8 kg (176 lb)                                                                                                                                                  Insurance Information HMO: YesPPO: PCP: IPA:80/20: OTHER: Group # [id number] PRIMARY: UHC medicarePolicy#: [id number] Subscriber: patient CM Name: Tarry Kos SmithPhone#: [phone]#: [phone] Pre-Cert#: [id number] Employer: Retired  Benefits: Phone #: [phone]: Online Eff. Date: 01/01/18Deduct: $0Out of Pocket Max: $4400Life Max: N/A CIR: $345 days 1-5SNF: $0 days 1-20; $160 days 21-48; $0 days 49-100 Outpatient:Medical necessityCo-Pay: $40/visit Home Health: 100%Co-Pay: none DME: 80%Co-Pay: 20% Providers: in network  Medicaid Application Date:Case Manager: Disability Application Date:Case Worker:   Emergency Tax adviser Information    Name Relation Home Work [place] B Spouse [place] Daughter   [phone]   Jim Like Daughter [phone]     [facility] Relative   [phone]     Current Medical History  Patient Admitting Diagnosis: Extension of stroke with worsening of right sided weakness with apraxia and aphasia   History of Present Illness: Valerie Tegeler [name] a 76 y.o.femalewith history of chronic LBP, CKD, L-MCA infarct s/p revascularization with  hemorrhagic transformation with resultant right sided weakness, aphasia, inattention, and new diagnosis of AFib. She underwent CIRstay complicated by encephalopathy due to narcotics (discontiued)and was discharged to home 01/07/17 at [facility] assist level. She was readmitted 01/11/17 with increase in aphasia and worsening of right sided weakness. She had not received Eliquis x2 days at home and MS contin had been resumed. Daughters expressed concerns about safety at home with illicit drug use and heroin overdose by a family member recently. UDS positive for opiates. She was started on IV fluid for hydration andMRI brain done showing interval acute/early subacute infarct in left caudate extending to periventricular and left medial thalamus, left anterior temporal lobe encephalomalacia, decrease in size of left basal ganglia subacute hematoma. TPA not administered due to recent ICH. Dr. [name] recommended continuing Eliquis and compliance with antithrombotic medications.   MBS done to evaluate swallow and patient currently on dysphagia 3 textures with nectar-thick liquids due to dysphagia and aspiration risk. Cognitive-linguistic evaluation done revealing perseverative language with moderate expressive aphasia and comprehension 60% intact for biographic and Y/N questions. Therapy initiated and patient limited by decrease in RUE motor planning, decreased balance, aphasia, RLE ataxia, and weakness causing decline in mobility and ability to  carry out ADL tasks. CIR recommended for follow up therapy and patient admitted to [facility] 01/14/17.   NIH Total: 9  Past Medical History      Past Medical History:  Diagnosis Date  . Blood transfusion without reported diagnosis   . Chronic low back pain   . Hyperlipidemia   . Osteopenia   . Unspecified essential hypertension   . Unspecified hypothyroidism     Family History  family history includes Heart disease (age of onset: 14) in her father; Lung cancer (age  of onset: 10) in her brother.  Prior Rehab/Hospitalizations:  Has the patient had major surgery during 100 days prior to admission? No  Current Medications   Current Facility-Administered Medications:  .   stroke: mapping our early stages of recovery book, , Does not apply, Once, Alcario Drought, Jared M, DO .  0.9 %  sodium chloride infusion, , Intravenous, Continuous, Etta Quill, DO, Last Rate: 75 mL/hr at 01/13/17 0640 .  acetaminophen (TYLENOL) tablet 650 mg, 650 mg, Oral, Q4H PRN **OR** acetaminophen (TYLENOL) solution 650 mg, 650 mg, Per Tube, Q4H PRN **OR** acetaminophen (TYLENOL) suppository 650 mg, 650 mg, Rectal, Q4H PRN, Etta Quill, DO .  apixaban (ELIQUIS) tablet 5 mg, 5 mg, Oral, BID, Jennette Kettle M, DO, 5 mg at 01/14/17 0955 .  chlorhexidine (PERIDEX) 0.12 % solution 15 mL, 15 mL, Mouth Rinse, BID, Velvet Bathe, MD, 15 mL at 01/14/17 0955 .  FLUoxetine (PROZAC) capsule 20 mg, 20 mg, Oral, Daily, Jennette Kettle M, DO, 20 mg at 01/14/17 0955 .  lactated ringers bolus 250 mL, 250 mL, Intravenous, Once, Mu, Frank, MD .  lactated ringers infusion, , Intravenous, Continuous, Mu, Frank, MD .  levothyroxine (SYNTHROID, LEVOTHROID) tablet 88 mcg, 88 mcg, Oral, QAC breakfast, Etta Quill, DO, 88 mcg at 01/14/17 0954 .  MEDLINE mouth rinse, 15 mL, Mouth Rinse, q12n4p, Velvet Bathe, MD, 15 mL at 01/13/17 1600 .  metoprolol tartrate (LOPRESSOR) tablet 25 mg, 25 mg, Oral, BID, Etta Quill, DO, 25 mg at 01/14/17 0954 .  nortriptyline (PAMELOR) capsule 20 mg, 20 mg, Oral, QHS, Gardner, Jared M, DO, 20 mg at 01/13/17 2252 .  ondansetron (ZOFRAN) injection 4 mg, 4 mg, Intravenous, Q6H PRN, Alcario Drought, Toy Care, DO .  RESOURCE THICKENUP CLEAR, , Oral, PRN, Velvet Bathe, MD .  senna-docusate (Senokot-S) tablet 1 tablet, 1 tablet, Oral, QHS PRN, Etta Quill, DO .  simvastatin (ZOCOR) tablet 10 mg, 10 mg, Oral, q1800, Etta Quill, DO, 10 mg at 01/13/17 1733  Patients  Current Diet: DIET DYS 3 Room service appropriate? Yes; Fluid consistency: Nectar Thick  Precautions / Restrictions Precautions Precautions: Fall Precaution Comments: R sided weakness, R inattention Restrictions Weight Bearing Restrictions: No   Has the patient had 2 or more falls or a fall with injury in the past year?No  Prior Activity Level Community (5-7x/wk): Prior to this admission patient was a Min assist with transfers and gait and had family support to assist at home.    Home Assistive Devices / Equipment Home Assistive Devices/Equipment: None Home Equipment: Bedside commode, Cane - single point  Prior Device Use: Indicate devices/aids used by the patient prior to current illness, exacerbation or injury? Walker  Prior Functional Level Prior Function Level of Independence: Needs assistance Gait / Transfers Assistance Needed: Pt/family reports ambulating without AD ADL's / Homemaking Assistance Needed: Pt had recently returned home from SNF level rehab. She was requiring assistance for dressing/bathing tasks.  Self Care:  Did the patient need help bathing, dressing, using the toilet or eating? Needed some help  Indoor Mobility: Did the patient need assistance with walking from room to room (with or without device)? Needed some help  Stairs: Did the patient need assistance with internal or external stairs (with or without device)? Needed some help  Functional Cognition: Did the patient need help planning regular tasks such as shopping or remembering to take medications? Needed some help  Current Functional Level Cognition  Arousal/Alertness: Awake/alert Overall Cognitive Status: Difficult to assess Difficult to assess due to: Impaired communication Current Attention Level: Selective Orientation Level: Oriented to person, Disoriented to time, Disoriented to situation, Oriented to place Following Commands: Follows one step commands consistently, Follows  multi-step commands with increased time Safety/Judgement: Decreased awareness of deficits, Decreased awareness of safety General Comments: at times following commands with oposite site Attention: Sustained Sustained Attention: Appears intact Memory:  (to be assessed further) Awareness: Impaired Awareness Impairment: Intellectual impairment, Emergent impairment Problem Solving:  (TBA) Safety/Judgment: Impaired    Extremity Assessment (includes Sensation/Coordination)  Upper Extremity Assessment: Defer to OT evaluation RUE Deficits / Details: Unable to sustain contraction throughout RUE with jerking/tremoring movement. Able to complete full AROM but unable to sustain position.  RUE Coordination: decreased gross motor, decreased fine motor  Lower Extremity Assessment: RLE deficits/detail, LLE deficits/detail RLE Deficits / Details: Strength grossly 3/5.  Decreased coordination.  RLE Coordination: decreased gross motor LLE Coordination: decreased gross motor    ADLs  Overall ADL's : Needs assistance/impaired Eating/Feeding: Minimal assistance, Sitting Grooming: Minimal assistance, Sitting Upper Body Bathing: Moderate assistance, Sitting Lower Body Bathing: Sit to/from stand, Maximal assistance Upper Body Dressing : Moderate assistance, Sitting Lower Body Dressing: Sit to/from stand, Maximal assistance Toilet Transfer: Moderate assistance, Stand-pivot Toilet Transfer Details (indicate cue type and reason): Attempting use of RW initially as pt reporting that she was using this at home (family later clarified that she was not utilizing AD). Pt with inattention to R side and inability to grasp RW with R hand and completed transfer without RW consequently.  Toileting- Clothing Manipulation and Hygiene: Maximal assistance, Sit to/from stand Functional mobility during ADLs: Moderate assistance (stand-pivot transfer only) General ADL Comments: Poor coordination and control of R UE impacting  her ability to participate.     Mobility  Overal bed mobility: Needs Assistance Bed Mobility: Supine to Sit Rolling: Min guard Sidelying to sit: Mod assist Supine to sit: Mod assist, HOB elevated General bed mobility comments: up in recliner upon arrival    Transfers  Overall transfer level: Needs assistance Equipment used: Rolling walker (2 wheeled) Transfers: Sit to/from Stand Sit to Stand: Min assist Stand pivot transfers: Mod assist General transfer comment: MIN A to power up from recliner with tendency to rely on posterior legs with transfer.  Multiple reps with cues for hand placement and forward weight shift    Ambulation / Gait / Stairs / Wheelchair Mobility  Ambulation/Gait Ambulation/Gait assistance: Min assist, Mod assist Ambulation Distance (Feet): 80 Feet Assistive device: Rolling walker (2 wheeled) Gait Pattern/deviations: Narrow base of support, Decreased step length - right, Decreased step length - left, Trendelenburg General Gait Details: No R LE buckeling today, but weakness noted in R LE throughout gait.  A to maintain R hand on RW.  Son followed behind with recliner. Gait velocity: decreased    Posture / Balance Dynamic Sitting Balance Sitting balance - Comments: min guard in sitting in chair, cues for upright positioning  Balance Overall balance assessment: Needs  assistance Sitting-balance support: Single extremity supported, Feet supported, Feet unsupported Sitting balance-Leahy Scale: Poor Sitting balance - Comments: min guard in sitting in chair, cues for upright positioning  Postural control: Posterior lean Standing balance support: Bilateral upper extremity supported Standing balance-Leahy Scale: Poor Standing balance comment: Posterior lean with pt requiring mod physical assistance and B UE support.  Manual assist to maintain RUE support on RW    Special needs/care consideration BiPAP/CPAP: No CPM: No Continuous Drip IV: No Dialysis: No          Life Vest: No Oxygen: No Special Bed: No Trach Size: No Wound Vac (area): No       Skin: Ecchymosis to right and left arms                                Bowel mgmt: Continent 01/14/17 Bladder mgmt: Continent intermittently  Diabetic mgmt: No     Previous Home Environment Living Arrangements: Spouse/significant other Available Help at Discharge: Family, Available 24 hours/day Type of Home: Apartment Home Layout: One level Home Access: Level entry Bathroom Shower/Tub: Tub/shower unit Home Care Services: No Additional Comments: Majority of home information taken from previous admission and pt's husband at end of session on his arrival.   Discharge Living Setting Plans for Discharge Living Setting: Lives with (comment) (Spouse ) Type of Home at Discharge: Apartment Discharge Home Layout: One level Discharge Home Access: Level entry Discharge Bathroom Shower/Tub: Tub/shower unit Discharge Bathroom Toilet: Standard Discharge Bathroom Accessibility: Yes How Accessible: Accessible via walker Does the patient have any problems obtaining your medications?: No  Social/Family/Support Systems Patient Roles: Spouse, Parent Contact Information: "[place] - husband - [phone] Anticipated Caregiver: Spouse  Anticipated Caregiver's Contact Information: see above  Ability/Limitations of Caregiver: Husband is retired and can Financial trader Availability: 24/7 Discharge Plan Discussed with Primary Caregiver: Yes Is Caregiver In Agreement with Plan?: Yes Does Caregiver/Family have Issues with Lodging/Transportation while Pt is in Rehab?: No  Goals/Additional Needs Patient/Family Goal for Rehab: PT Min assist; OT Min-Mod assist; SLP Supervision  Expected length of stay: 7-11 days  Cultural Considerations: [employer] Dietary Needs: Dys.3 textures and nectar-thick liquids  Equipment Needs: TBD Special Service Needs: None Additional Information: N/A Pt/Family Agrees  to Admission and willing to participate: Yes Program Orientation Provided & Reviewed with Pt/Caregiver Including Roles  & Responsibilities: Yes Additional Information Needs: Patient and spouse will require more education for medication management and specific directions for hydration. Information Needs to be Provided By: Team   Decrease burden of Care through IP rehab admission: No  Possible need for SNF placement upon discharge: No  Patient Condition: This patient's medical and functional status has changed since the consult dated 01/13/17 in which the Rehabilitation Physician determined and documented that the patient was potentially appropriate for intensive rehabilitative care in an inpatient rehabilitation facility. Issues have been addressed and update has been discussed with Dr. Posey Pronto and Dr. Letta Pate and patient now appropriate for inpatient rehabilitation. Will admit to inpatient rehab today.   Preadmission Screen Completed By:  Gunnar Fusi, 01/14/2017 4:47 PM ______________________________________________________________________   Discussed status with Dr. Letta Pate on 01/14/17 at 1430 and received telephone approval for admission today.  Admission Coordinator:  Gunnar Fusi, time 1430/Date 01/14/17       Cosigned by: Charlett Blake, MD at 01/14/2017 5:16 PM  Revision History

## 2017-01-15 NOTE — H&P (Signed)
Physical Medicine and Rehabilitation Admission H&P    Chief Complaint  Patient presents with  . Worsening of right sided weakness with apraxia, dysphagia and acute renal failure    HPI:   Valerie Mcclure is a 76 y.o. female with history of chronic LBP, CKD, L-MCA infarct s/p revascularization with hemorrhagic transformation with resultant right sided weakness, aphasia and inattention and new diagnosis of AFib. She underwent CIR  stay complicated by encephalopathy due to narcotics (discontiued)  and was discharged to home 6/21 at min assist level. She was readmitted 01/11/17 with increase in aphasia and worsening of right sided weakness. She had not received Eliquis X 2 days at home and MS contin had been resumed. Daughters expressed concerns about safety at home with illicit drug use and heroin overdose by a family member recently. UDS positive for opiates. She was started on IV fluid for hydration and  MRI brain done showing interval acute/early subacute infarct in left caudate extending to periventricular and left medial thalamus, left anterior temporal lobe encephalomalacia, decrease in size of left basal ganglia subacute hematoma. TPA not administered due to recent ICH.  Dr. [name] recommended continuing Eliquis and compliance with antithrombotic medications.   MBS done to evaluate swallow and patient on dysphagia 2, nectar liquids due to dysphagia and aspiration risk. Cognitive evaluation done revealing perseverative language with moderate expressive aphasia and comprehension 60% intact for biographic and Y/N questions. Therapy initiated and patient limited by decrease in RUE motor planning, decreased balance, aphasia, RLE ataxia and weakness causing decline in mobility and ability to carry out ADL tasks. CIR recommended for follow up therapy.     Review of Systems  HENT: Negative for hearing loss and tinnitus.   Eyes: Negative for blurred vision and double vision.  Respiratory:  Negative for cough and shortness of breath.   Cardiovascular: Negative for chest pain and palpitations.  Gastrointestinal: Negative for constipation and heartburn.  Genitourinary: Negative for dysuria, frequency and urgency.  Musculoskeletal: Negative for back pain, joint pain, myalgias and neck pain.  Neurological: Positive for speech change and focal weakness. Negative for dizziness, tingling and headaches.  Psychiatric/Behavioral: The patient is not nervous/anxious and does not have insomnia.   All other systems reviewed and are negative.     Past Medical History:  Diagnosis Date  . Blood transfusion without reported diagnosis   . Chronic low back pain   . Hyperlipidemia   . Osteopenia   . Unspecified essential hypertension   . Unspecified hypothyroidism     Past Surgical History:  Procedure Laterality Date  . ABDOMINAL HYSTERECTOMY  1970  . CHOLECYSTECTOMY  07/2009   Dr. Rise Patience  . COLONOSCOPY  2003  . FLEXIBLE SIGMOIDOSCOPY  2010  . HAND SURGERY    . IR ANGIO INTRA EXTRACRAN SEL COM CAROTID INNOMINATE UNI L MOD SED  11/29/2016  . IR ANGIO VERTEBRAL SEL SUBCLAVIAN INNOMINATE UNI R MOD SED  11/29/2016  . IR PERCUTANEOUS ART THROMBECTOMY/INFUSION INTRACRANIAL INC DIAG ANGIO  11/29/2016  . LUMBAR LAMINECTOMY  11/2008   Done by Dr. Patrice Paradise  . RADIOLOGY WITH ANESTHESIA N/A 11/29/2016   Procedure: RADIOLOGY WITH ANESTHESIA;  Surgeon: Radiologist, Medication, MD;  Location: [facility];  Service: Radiology;  Laterality: N/A;  . THYROIDECTOMY      Family History  Problem Relation Age of Onset  . Heart disease Father 48       MI age 56s  . Lung cancer Brother 67  . Colon cancer Neg Hx   .  Esophageal cancer Neg Hx   . Rectal cancer Neg Hx   . Stomach cancer Neg Hx     Social History:  Married.  Retired and independent prior to stroke. Daughter lives with them and assists as needed. She does not use tobacco, alcohol or illicit drugs.    Allergies  Allergen Reactions  . Shrimp  [Shellfish Allergy] Anaphylaxis and Rash    Break outs and swelling of the throat  . Tandem Plus [Fefum-Fepo-Fa-B Cmp-C-Zn-Mn-Cu] Nausea And Vomiting  . Ivp Dye [Iodinated Diagnostic Agents] Rash    itching  . Penicillins Itching and Rash    Has patient had a PCN reaction causing immediate rash, facial/tongue/throat swelling, SOB or lightheadedness with hypotension: Yes Has patient had a PCN reaction causing severe rash involving mucus membranes or skin necrosis: No Has patient had a PCN reaction that required hospitalization: No Has patient had a PCN reaction occurring within the last 10 years: No If all of the above answers are "NO", then may proceed with Cephalosporin use.     Medications Prior to Admission  Medication Sig Dispense Refill  . apixaban (ELIQUIS) 5 MG TABS tablet Take 1 tablet (5 mg total) by mouth 2 (two) times daily. 60 tablet 0  . Calcium Carbonate-Vit D-Min (CALCIUM 1200 PO) Take 1 tablet by mouth daily.     . Cholecalciferol (VITAMIN D3) 1000 UNITS CAPS Take 1,000 Units by mouth daily.     Marland Kitchen FLUoxetine (PROZAC) 20 MG capsule Take 1 capsule (20 mg total) by mouth daily. 30 capsule 11  . furosemide (LASIX) 20 MG tablet Take 1 tablet (20 mg total) by mouth daily. 30 tablet 0  . levothyroxine (SYNTHROID, LEVOTHROID) 88 MCG tablet Take 1 tablet (88 mcg total) by mouth daily. 90 tablet 1  . metoprolol tartrate (LOPRESSOR) 25 MG tablet Take 1 tablet (25 mg total) by mouth 2 (two) times daily. 60 tablet 0  . morphine (MS CONTIN) 15 MG 12 hr tablet Take 15 mg by mouth every 12 (twelve) hours.    . nortriptyline (PAMELOR) 10 MG capsule Take 2 capsules (20 mg total) by mouth at bedtime. 180 capsule 1  . potassium chloride SA (K-DUR,KLOR-CON) 20 MEQ tablet Take 1 tablet (20 mEq total) by mouth daily. 30 tablet 0  . simvastatin (ZOCOR) 10 MG tablet TAKE 1 TABLET(10 MG) BY MOUTH DAILY 90 tablet 3  . valsartan (DIOVAN) 160 MG tablet Take 1 tablet (160 mg total) by mouth daily. 90  tablet 3  . vitamin B-12 (CYANOCOBALAMIN) 100 MCG tablet Take 100 mcg by mouth daily.      Home: Home Living Family/patient expects to be discharged to:: Private residence Living Arrangements: Spouse/significant other Available Help at Discharge: Family, Available 24 hours/day Type of Home: Apartment Home Access: Level entry [place]: One level Bathroom Shower/Tub: Tub/shower unit Home Equipment: Bedside commode, Cane - single point Additional Comments: Majority of home information taken from previous admission and pt's husband at end of session on his arrival.    Functional History: Prior Function Level of Independence: Needs assistance Gait / Transfers Assistance Needed: Pt/family reports ambulating without AD ADL's / Homemaking Assistance Needed: Pt had recently returned home from SNF level rehab. She was requiring assistance for dressing/bathing tasks.  Functional Status:  Mobility: Bed Mobility Overal bed mobility: Needs Assistance Bed Mobility: Supine to Sit Rolling: Min guard Sidelying to sit: Mod assist Supine to sit: Mod assist, HOB elevated General bed mobility comments: up in recliner upon arrival Transfers Overall transfer level:  Needs assistance Equipment used: Rolling walker (2 wheeled) Transfers: Sit to/from Stand Sit to Stand: Min assist Stand pivot transfers: Mod assist General transfer comment: MIN A to power up from recliner with tendency to rely on posterior legs with transfer.  Multiple reps with cues for hand placement and forward weight shift Ambulation/Gait Ambulation/Gait assistance: Min assist, Mod assist Ambulation Distance (Feet): 80 Feet Assistive device: Rolling walker (2 wheeled) Gait Pattern/deviations: Narrow base of support, Decreased step length - right, Decreased step length - left, Trendelenburg General Gait Details: No R LE buckeling today, but weakness noted in R LE throughout gait.  A to maintain R hand on RW.  Son followed behind  with recliner. Gait velocity: decreased    ADL: ADL Overall ADL's : Needs assistance/impaired Eating/Feeding: Minimal assistance, Sitting Grooming: Minimal assistance, Sitting Upper Body Bathing: Moderate assistance, Sitting Lower Body Bathing: Sit to/from stand, Maximal assistance Upper Body Dressing : Moderate assistance, Sitting Lower Body Dressing: Sit to/from stand, Maximal assistance Toilet Transfer: Moderate assistance, Stand-pivot Toilet Transfer Details (indicate cue type and reason): Attempting use of RW initially as pt reporting that she was using this at home (family later clarified that she was not utilizing AD). Pt with inattention to R side and inability to grasp RW with R hand and completed transfer without RW consequently.  Toileting- Clothing Manipulation and Hygiene: Maximal assistance, Sit to/from stand Functional mobility during ADLs: Moderate assistance (stand-pivot transfer only) General ADL Comments: Poor coordination and control of R UE impacting her ability to participate.   Cognition: Cognition Overall Cognitive Status: Difficult to assess Arousal/Alertness: Awake/alert Orientation Level: Oriented to person, Disoriented to time, Disoriented to situation, Oriented to place Attention: Sustained Sustained Attention: Appears intact Memory:  (to be assessed further) Awareness: Impaired Awareness Impairment: Intellectual impairment, Emergent impairment Problem Solving:  (TBA) Safety/Judgment: Impaired Cognition Arousal/Alertness: Awake/alert Behavior During Therapy: WFL for tasks assessed/performed Overall Cognitive Status: Difficult to assess Area of Impairment: Problem solving, Attention, Safety/judgement Current Attention Level: Selective Following Commands: Follows one step commands consistently, Follows multi-step commands with increased time Safety/Judgement: Decreased awareness of deficits, Decreased awareness of safety Awareness:  Intellectual Problem Solving: Decreased initiation, Requires verbal cues, Requires tactile cues General Comments: at times following commands with oposite site Difficult to assess due to: Impaired communication   Blood pressure 137/75, pulse 68, temperature 97.8 F (36.6 C), temperature source Oral, resp. rate 18, height 5\' 3"  (1.6 m), weight 79.8 kg (176 lb), SpO2 97 %. Physical Exam  Nursing note and vitals reviewed. Constitutional: She is oriented to person, place, and time. She appears well-developed and well-nourished. No distress.  Smiling and laughing to compensate (as before)  HENT:  Head: Normocephalic and atraumatic.  Eyes: Conjunctivae and EOM are normal. Pupils are equal, round, and reactive to light.  Neck: Normal range of motion. Neck supple.  Cardiovascular: Normal rate and regular rhythm.   No murmur heard. Respiratory: Effort normal and breath sounds normal. No stridor. No respiratory distress. She has no wheezes.  GI: Soft. Bowel sounds are normal. She exhibits no distension. There is no tenderness.  Musculoskeletal: She exhibits no edema or tenderness.  Right hand cool to touch. Peripheral edema noted--1+ pitting LLE.  Neurological: She is alert and oriented to person, place, and time.  Right facial weakness.  Mixed aphasia with occasional jargon.  Able to answer Y/N biographic questions.  Motor: RUE/RLE 4+/5 with apraxia  Skin: Skin is warm and dry. She is not diaphoretic.  Psychiatric: She has a normal mood and  affect. Her behavior is normal.    Results for orders placed or performed during the hospital encounter of 01/11/17 (from the past 48 hour(s))  Basic metabolic panel     Status: Abnormal   Collection Time: 01/13/17  7:19 AM  Result Value Ref Range   Sodium 141 135 - 145 mmol/L   Potassium 4.2 3.5 - 5.1 mmol/L   Chloride 109 101 - 111 mmol/L   CO2 25 22 - 32 mmol/L   Glucose, Bld 114 (H) 65 - 99 mg/dL   BUN 34 (H) 6 - 20 mg/dL   Creatinine, Ser 1.59  (H) 0.44 - 1.00 mg/dL   Calcium 8.6 (L) 8.9 - 10.3 mg/dL   GFR calc non Af Amer 30 (L) >60 mL/min   GFR calc Af Amer 35 (L) >60 mL/min    Comment: (NOTE) The eGFR has been calculated using the CKD EPI equation. This calculation has not been validated in all clinical situations. eGFR's persistently <60 mL/min signify possible Chronic Kidney Disease.    Anion gap 7 5 - 15  Basic metabolic panel     Status: Abnormal   Collection Time: 01/14/17  5:22 AM  Result Value Ref Range   Sodium 142 135 - 145 mmol/L   Potassium 3.9 3.5 - 5.1 mmol/L   Chloride 113 (H) 101 - 111 mmol/L   CO2 22 22 - 32 mmol/L   Glucose, Bld 104 (H) 65 - 99 mg/dL   BUN 21 (H) 6 - 20 mg/dL   Creatinine, Ser 1.25 (H) 0.44 - 1.00 mg/dL   Calcium 8.6 (L) 8.9 - 10.3 mg/dL   GFR calc non Af Amer 40 (L) >60 mL/min   GFR calc Af Amer 47 (L) >60 mL/min    Comment: (NOTE) The eGFR has been calculated using the CKD EPI equation. This calculation has not been validated in all clinical situations. eGFR's persistently <60 mL/min signify possible Chronic Kidney Disease.    Anion gap 7 5 - 15        Medical Problem List and Plan: 1.  Decrease mobility, apraxia, decreased balance, aphasia, ataxia secondary to left CVA.  Begin CIR 2.  DVT Prophylaxis/Anticoagulation: Pharmaceutical: Other (comment)--Eliquis 3. Pain Management: tylenol prn. No issues with pain at prior admission. Continue pamelor at bedtime 4. Mood:  LCSW to follow for evaluation and support.  5. Neuropsych: This patient is not fully capable of making decisions on his own behalf. 6. Skin/Wound Care: routine pressure relief measures 7. Fluids/Electrolytes/Nutrition:  Monitor I/Os. Encourage fluid intake  Now on nectar liquids.    8. HTN: Avoid ACE/ARB with history of CKD as well as issues with oral intake. Monitor BP-- bid.   Continue metoprolol bid.  Monitor with increased mobility 9. Mild leucocytosis: Resolved  WBCs 5.4 on 6/29 10. Dysphagia:    Changed IVF to nights as renal status almost back to baseline., will d/c next week 11. Dyslipidemia: On statin.  12. CKD   Cr 1.17 on 6/29   Cont to monitor 13. Hypoalbuminemia  Supplement initiated 6/29 14. ABLA  Hb 8.9 on 6/29  Cont to monitor  Post Admission Physician Evaluation: 1. Preadmission assessment reviewed and changes made below. 2. Functional deficits secondary  to Left CVA. 3. Patient is admitted to receive collaborative, interdisciplinary care between the physiatrist, rehab nursing staff, and therapy team. 4. Patient's level of medical complexity and substantial therapy needs in context of that medical necessity cannot be provided at a lesser intensity of care such as a  SNF. 5. Patient has experienced substantial functional loss from his/her baseline which was documented above under the "Functional History" and "Functional Status" headings.  Judging by the patient's diagnosis, physical exam, and functional history, the patient has potential for functional progress which will result in measurable gains while on inpatient rehab.  These gains will be of substantial and practical use upon discharge  in facilitating mobility and self-care at the household level. 37. Physiatrist will provide 24 hour management of medical needs as well as oversight of the therapy plan/treatment and provide guidance as appropriate regarding the interaction of the two. 7. 24 hour rehab nursing will assist with safety, disease management and patient education  and help integrate therapy concepts, techniques,education, etc. 8. PT will assess and treat for/with: Lower extremity strength, range of motion, stamina, balance, functional mobility, safety, adaptive techniques and equipment, coping skills, pain control, stroke education.   Goals are: Min A. 9. OT will assess and treat for/with: ADL's, functional mobility, safety, upper extremity strength, adaptive techniques and equipment, ego support, and  community reintegration.   Goals are: Min A. Therapy may proceed with showering this patient. 10. SLP will assess and treat for/with: speech, language, cognition.  Goals are: Supervision. 11. Case Management and Social Worker will assess and treat for psychological issues and discharge planning. 12. Team conference will be held weekly to assess progress toward goals and to determine barriers to discharge. 13. Patient will receive at least 3 hours of therapy per day at least 5 days per week. 14. ELOS: 6-9 days.       15. Prognosis:  good  Delice Lesch, MD, Mellody Drown 01/15/17 Bary Leriche, PA-C 01/14/2017

## 2017-01-15 NOTE — Progress Notes (Signed)
Valerie Arn, MD Physician Signed Physical Medicine and Rehabilitation  Consult Note Date of Service: 01/13/2017 2:42 PM  Related encounter: ED to Hosp-Admission (Discharged) from 01/11/2017 in [place] All Collapse All   [] Hide copied text [] Hover for attribution information      Physical Medicine and Rehabilitation Consult   Reason for Consult: extension of stroke with worsening of right sided weakness with apraxia and aphasia Referring Physician: Dr. Wendee Beavers.    HPI: Valerie Mcclure is a 76 y.o. female with history of chronic LBP, CKD, L-MCA infarct s/p revascularization with hemorrhagic transformation with resultant right sided weakness, aphasia and inattention and new diagnosis of AFib. She underwent CIR  stay complicated by encephalopathy due to narcotics (discontiued)  and was discharged to home 6/21 at min assist level. She was readmitted 01/11/17 with increase in aphasia and worsening of right sided weakness. She had not received Eliquis X 2 days at home and MS contin had been resumed. Daughters expressed concerns about safety at home with illicit drug use and heroin overdose by a family member recently. UDS positive for opiates. She was started on IV fluid for hydration and  MRI brain done showing interval acute/early subacute infarct in left caudate extending to periventricular and left medial thalamus, left anterior temporal lobe encephalomalacia, decrease in size of left basal ganglia subacute hematoma. TPA not administered due to recent ICH.  Dr. [name] recommended continuing Eliquis and compliance with antithrombotic medications.   MBS done today to evaluate swallow and patient on dysphagia 2, nectar liquids due to dysphagia and aspiration risk. Cognitive evaluation done revealing perseverative language with moderate expressive aphasia and comprehension 60% intact for biographic and Y/N questions. PT/OT evaluations done  revealing decrease in RUE motor planning, decreased balance, RLE ataxia and weakness causing decline in mobility and ability to carry out ADL tasks. CIR recommended for follow up therapy.     Review of Systems  Unable to perform ROS: Language          Past Medical History:  Diagnosis Date  . Blood transfusion without reported diagnosis   . Chronic low back pain   . Hyperlipidemia   . Osteopenia   . Unspecified essential hypertension   . Unspecified hypothyroidism         Past Surgical History:  Procedure Laterality Date  . ABDOMINAL HYSTERECTOMY  1970  . CHOLECYSTECTOMY  07/2009   Dr. Rise Patience  . COLONOSCOPY  2003  . FLEXIBLE SIGMOIDOSCOPY  2010  . HAND SURGERY    . IR ANGIO INTRA EXTRACRAN SEL COM CAROTID INNOMINATE UNI L MOD SED  11/29/2016  . IR ANGIO VERTEBRAL SEL SUBCLAVIAN INNOMINATE UNI R MOD SED  11/29/2016  . IR PERCUTANEOUS ART THROMBECTOMY/INFUSION INTRACRANIAL INC DIAG ANGIO  11/29/2016  . LUMBAR LAMINECTOMY  11/2008   Done by Dr. Patrice Paradise  . RADIOLOGY WITH ANESTHESIA N/A 11/29/2016   Procedure: RADIOLOGY WITH ANESTHESIA;  Surgeon: Radiologist, Medication, MD;  Location: [facility];  Service: Radiology;  Laterality: N/A;  . THYROIDECTOMY          Family History  Problem Relation Age of Onset  . Heart disease Father 57       MI age 63s  . Lung cancer Brother 26  . Colon cancer Neg Hx   . Esophageal cancer Neg Hx   . Rectal cancer Neg Hx   . Stomach cancer Neg Hx    Social History:  reports that she has never  smoked. She has never used smokeless tobacco. She reports that she does not drink alcohol or use drugs. Allergies:       Allergies  Allergen Reactions  . Shrimp [Shellfish Allergy] Anaphylaxis and Rash    Break outs and swelling of the throat  . Tandem Plus [Fefum-Fepo-Fa-B Cmp-C-Zn-Mn-Cu] Nausea And Vomiting  . Ivp Dye [Iodinated Diagnostic Agents] Rash    itching  . Penicillins Itching and Rash    Has patient had a  PCN reaction causing immediate rash, facial/tongue/throat swelling, SOB or lightheadedness with hypotension: Yes Has patient had a PCN reaction causing severe rash involving mucus membranes or skin necrosis: No Has patient had a PCN reaction that required hospitalization: No Has patient had a PCN reaction occurring within the last 10 years: No If all of the above answers are "NO", then may proceed with Cephalosporin use.          Medications Prior to Admission  Medication Sig Dispense Refill  . apixaban (ELIQUIS) 5 MG TABS tablet Take 1 tablet (5 mg total) by mouth 2 (two) times daily. 60 tablet 0  . Calcium Carbonate-Vit D-Min (CALCIUM 1200 PO) Take 1 tablet by mouth daily.     . Cholecalciferol (VITAMIN D3) 1000 UNITS CAPS Take 1,000 Units by mouth daily.     Marland Kitchen FLUoxetine (PROZAC) 20 MG capsule Take 1 capsule (20 mg total) by mouth daily. 30 capsule 11  . furosemide (LASIX) 20 MG tablet Take 1 tablet (20 mg total) by mouth daily. 30 tablet 0  . levothyroxine (SYNTHROID, LEVOTHROID) 88 MCG tablet Take 1 tablet (88 mcg total) by mouth daily. 90 tablet 1  . metoprolol tartrate (LOPRESSOR) 25 MG tablet Take 1 tablet (25 mg total) by mouth 2 (two) times daily. 60 tablet 0  . morphine (MS CONTIN) 15 MG 12 hr tablet Take 15 mg by mouth every 12 (twelve) hours.    . nortriptyline (PAMELOR) 10 MG capsule Take 2 capsules (20 mg total) by mouth at bedtime. 180 capsule 1  . potassium chloride SA (K-DUR,KLOR-CON) 20 MEQ tablet Take 1 tablet (20 mEq total) by mouth daily. 30 tablet 0  . simvastatin (ZOCOR) 10 MG tablet TAKE 1 TABLET(10 MG) BY MOUTH DAILY 90 tablet 3  . valsartan (DIOVAN) 160 MG tablet Take 1 tablet (160 mg total) by mouth daily. 90 tablet 3  . vitamin B-12 (CYANOCOBALAMIN) 100 MCG tablet Take 100 mcg by mouth daily.      Home: Home Living Family/patient expects to be discharged to:: Private residence Living Arrangements: Spouse/significant other Available Help at  Discharge: Family, Available 24 hours/day Type of Home: Apartment Home Access: Level entry [place]: One level Bathroom Shower/Tub: Tub/shower unit Home Equipment: Bedside commode, Cane - single point Additional Comments: Majority of home information taken from previous admission and pt's husband at end of session on his arrival.   Functional History: Prior Function Level of Independence: Needs assistance Gait / Transfers Assistance Needed: Pt/family reports ambulating without AD ADL's / Homemaking Assistance Needed: Pt had recently returned home from SNF level rehab. She was requiring assistance for dressing/bathing tasks. Functional Status:  Mobility: Bed Mobility Overal bed mobility: Needs Assistance Bed Mobility: Supine to Sit Rolling: Min guard Sidelying to sit: Mod assist Supine to sit: Mod assist, HOB elevated General bed mobility comments: assist to elevate trunk into sitting and scoot hips toward EOB; multimodal cues for use of R UE; pt able to maintain sitting balance EOB Transfers Overall transfer level: Needs assistance Equipment used: Rolling  walker (2 wheeled), 1 person hand held assist Transfers: Sit to/from Stand Sit to Stand: Min assist, Mod assist Stand pivot transfers: Mod assist General transfer comment: min A to power up into standing and mod A upon standing for balance Ambulation/Gait Ambulation/Gait assistance: Mod assist Ambulation Distance (Feet): 6 Feet Assistive device: Rolling walker (2 wheeled) Gait Pattern/deviations: Ataxic, Shuffle, Narrow base of support General Gait Details: RLE buckling at times when trying to support single leg transition, very ataxic in step coordination. Moderate physical assist for stability and manual assist to keep RUE on RW, max VCs for attention  ADL: ADL Overall ADL's : Needs assistance/impaired Eating/Feeding: Minimal assistance, Sitting Grooming: Minimal assistance, Sitting Upper Body Bathing: Moderate  assistance, Sitting Lower Body Bathing: Sit to/from stand, Maximal assistance Upper Body Dressing : Moderate assistance, Sitting Lower Body Dressing: Sit to/from stand, Maximal assistance Toilet Transfer: Moderate assistance, Stand-pivot Toilet Transfer Details (indicate cue type and reason): Attempting use of RW initially as pt reporting that she was using this at home (family later clarified that she was not utilizing AD). Pt with inattention to R side and inability to grasp RW with R hand and completed transfer without RW consequently.  Toileting- Clothing Manipulation and Hygiene: Maximal assistance, Sit to/from stand Functional mobility during ADLs: Moderate assistance (stand-pivot transfer only) General ADL Comments: Poor coordination and control of R UE impacting her ability to participate.   Cognition: Cognition Overall Cognitive Status: Difficult to assess Arousal/Alertness: Awake/alert Orientation Level: Oriented to person, Disoriented to place, Disoriented to time, Disoriented to situation Attention: Sustained Sustained Attention: Appears intact Memory:  (to be assessed further) Awareness: Impaired Awareness Impairment: Intellectual impairment, Emergent impairment Problem Solving:  (TBA) Safety/Judgment: Impaired Cognition Arousal/Alertness: Awake/alert Behavior During Therapy: WFL for tasks assessed/performed Overall Cognitive Status: Difficult to assess Area of Impairment: Problem solving, Attention, Safety/judgement Current Attention Level: Selective Following Commands: Follows one step commands consistently, Follows multi-step commands with increased time Safety/Judgement: Decreased awareness of deficits, Decreased awareness of safety Awareness: Intellectual Problem Solving: Decreased initiation, Requires verbal cues, Requires tactile cues General Comments: at times following commands with oposite site Difficult to assess due to: Impaired communication  Blood  pressure (!) 127/58, pulse 90, temperature 98 F (36.7 C), temperature source Oral, resp. rate 18, height 5\' 3"  (1.6 m), SpO2 100 %. Physical Exam  Vitals reviewed. Constitutional: She appears well-developed and well-nourished. No distress.  HENT:  Head: Normocephalic and atraumatic.  Mouth/Throat: Oropharynx is clear and moist.  Eyes: Conjunctivae and EOM are normal. Pupils are equal, round, and reactive to light.  Neck: Normal range of motion. Neck supple.  Cardiovascular: Normal rate and regular rhythm.   Respiratory: Effort normal and breath sounds normal. No stridor. No respiratory distress.  GI: Soft. Bowel sounds are normal.  Musculoskeletal: She exhibits no edema or tenderness.  Neurological: She is alert.  Right facial weakness with mild inattention.  Expressive > receptive deficits.   Able answer Y/N biographic questions.  RUE ataxia with apraxia with motor strength 4/5.  RLE ataxia with apraxia with motor strength 4-/5. LUE: 4+/5 proximal to distal LLE: 4/5 proximal to distal She was able to point and follow simple two step commands with LUE.   Skin: Skin is warm and dry. She is not diaphoretic.  Psychiatric: She has a normal mood and affect. Her behavior is normal.    Lab Results Last 24 Hours       Results for orders placed or performed during the hospital encounter of 01/11/17 (from the past  24 hour(s))  Basic metabolic panel     Status: Abnormal   Collection Time: 01/13/17  7:19 AM  Result Value Ref Range   Sodium 141 135 - 145 mmol/L   Potassium 4.2 3.5 - 5.1 mmol/L   Chloride 109 101 - 111 mmol/L   CO2 25 22 - 32 mmol/L   Glucose, Bld 114 (H) 65 - 99 mg/dL   BUN 34 (H) 6 - 20 mg/dL   Creatinine, Ser 1.59 (H) 0.44 - 1.00 mg/dL   Calcium 8.6 (L) 8.9 - 10.3 mg/dL   GFR calc non Af Amer 30 (L) >60 mL/min   GFR calc Af Amer 35 (L) >60 mL/min   Anion gap 7 5 - 15      Imaging Results (Last 48 hours)  Ct Head Wo Contrast  Result Date:  01/11/2017 CLINICAL DATA:  Recent fall.  12/19/2016 EXAM: CT HEAD WITHOUT CONTRAST TECHNIQUE: Contiguous axial images were obtained from the base of the skull through the vertex without intravenous contrast. COMPARISON:  12/19/2016 FINDINGS: Brain: Low -density within the left basal ganglia in the area of prior infarct with ray hemorrhagic conversion. Low-density in the anterior left temporal lobe is also in the area of prior infarct. No acute infarction or hemorrhage. No hydrocephalus. No mass effect or midline shift. Vascular: No hyperdense vessel or unexpected calcification. Skull: No acute calvarial abnormality. Sinuses/Orbits: Visualized paranasal sinuses and mastoids clear. Orbital soft tissues unremarkable. Other: None IMPRESSION: Evolutionary changes and the area of hemorrhage in the left basal ganglia and prior infarcts seen in the anterior left temporal lobe. No acute infarction or hemorrhage. Electronically Signed   By: Rolm Baptise M.D.   On: 01/11/2017 14:50   Mr Brain Wo Contrast  Result Date: 01/12/2017 CLINICAL DATA:  76 y/o F; worsening right upper and lower extremity weakness and new onset aphasia Saturday. Weakness has resolved with residual aphasia. EXAM: MRI HEAD WITHOUT CONTRAST MRA HEAD WITHOUT CONTRAST TECHNIQUE: Multiplanar, multiecho pulse sequences of the brain and surrounding structures were obtained without intravenous contrast. Angiographic images of the head were obtained using MRA technique without contrast. COMPARISON:  01/11/2017 CT head.  11/30/2016 MRI of the brain. FINDINGS: MRI HEAD FINDINGS Brain: There is mildly reduced diffusion within the left caudate body extending in the periventricular white matter and within the left medial thalamus with increased distribution in comparison with areas of infection seen on the prior MRI of the brain. There is mild associated T2 FLAIR hyperintense signal abnormality. Findings are compatible with interval acute to early subacute  infarction. Hematoma within the left basal ganglia is decreased in size from the prior MRI of the brain and demonstrates increased T1/T2 signal compatible with late subacute hemorrhage. T2 FLAIR hyperintense signal abnormality with pons likely representing microvascular ischemic changes. Left anterior temporal encephalomalacia corresponding to region of infarction on the prior MRI of the brain. The area of infarction demonstrates hemosiderin staining. Additionally, there is hemosiderin staining along the margins of the left lateral ventricle frontal horn. No hydrocephalus, herniation, or evidence for interval hemorrhage. Vascular: As below. Skull and upper cervical spine: Normal marrow signal. Sinuses/Orbits: Negative. Other: None. MRA HEAD FINDINGS Internal carotid arteries:  Patent. Anterior cerebral arteries:  Patent. Middle cerebral arteries: Patent. Anterior communicating artery: Patent. Posterior communicating arteries: Fetal left PCA. No right posterior communicating artery identified, likely hypoplastic or absent. Posterior cerebral arteries:  Patent. Basilar artery:  Patent. Vertebral arteries:  Patent. No evidence of high-grade stenosis, large vessel occlusion, or aneurysm unless noted above.  IMPRESSION: 1. Reduced diffusion within left caudate body extending in the periventricular white matter and within the left medial thalamus increased in distribution in comparison with prior MRI of the brain. Findings are consistent with interval acute/early subacute infarction. 2. Decreased size of late subacute hematoma within the left basal ganglia. Left lateral ventricle hemosiderin staining. 3. Left anterior temporal lobe encephalomalacia from prior infarction. 4. Patent circle of Willis. No large vessel occlusion, aneurysm, or significant stenosis is identified. Electronically Signed   By: Kristine Garbe M.D.   On: 01/12/2017 02:50   Mr Jodene Nam Head/brain EX Cm  Result Date: 01/12/2017 CLINICAL DATA:   76 y/o F; worsening right upper and lower extremity weakness and new onset aphasia Saturday. Weakness has resolved with residual aphasia. EXAM: MRI HEAD WITHOUT CONTRAST MRA HEAD WITHOUT CONTRAST TECHNIQUE: Multiplanar, multiecho pulse sequences of the brain and surrounding structures were obtained without intravenous contrast. Angiographic images of the head were obtained using MRA technique without contrast. COMPARISON:  01/11/2017 CT head.  11/30/2016 MRI of the brain. FINDINGS: MRI HEAD FINDINGS Brain: There is mildly reduced diffusion within the left caudate body extending in the periventricular white matter and within the left medial thalamus with increased distribution in comparison with areas of infection seen on the prior MRI of the brain. There is mild associated T2 FLAIR hyperintense signal abnormality. Findings are compatible with interval acute to early subacute infarction. Hematoma within the left basal ganglia is decreased in size from the prior MRI of the brain and demonstrates increased T1/T2 signal compatible with late subacute hemorrhage. T2 FLAIR hyperintense signal abnormality with pons likely representing microvascular ischemic changes. Left anterior temporal encephalomalacia corresponding to region of infarction on the prior MRI of the brain. The area of infarction demonstrates hemosiderin staining. Additionally, there is hemosiderin staining along the margins of the left lateral ventricle frontal horn. No hydrocephalus, herniation, or evidence for interval hemorrhage. Vascular: As below. Skull and upper cervical spine: Normal marrow signal. Sinuses/Orbits: Negative. Other: None. MRA HEAD FINDINGS Internal carotid arteries:  Patent. Anterior cerebral arteries:  Patent. Middle cerebral arteries: Patent. Anterior communicating artery: Patent. Posterior communicating arteries: Fetal left PCA. No right posterior communicating artery identified, likely hypoplastic or absent. Posterior cerebral  arteries:  Patent. Basilar artery:  Patent. Vertebral arteries:  Patent. No evidence of high-grade stenosis, large vessel occlusion, or aneurysm unless noted above. IMPRESSION: 1. Reduced diffusion within left caudate body extending in the periventricular white matter and within the left medial thalamus increased in distribution in comparison with prior MRI of the brain. Findings are consistent with interval acute/early subacute infarction. 2. Decreased size of late subacute hematoma within the left basal ganglia. Left lateral ventricle hemosiderin staining. 3. Left anterior temporal lobe encephalomalacia from prior infarction. 4. Patent circle of Willis. No large vessel occlusion, aneurysm, or significant stenosis is identified. Electronically Signed   By: Kristine Garbe M.D.   On: 01/12/2017 02:50     Assessment/Plan: Diagnosis: extension of stroke with worsening of right sided weakness with apraxia and aphasia Labs and images independently reviewed.  Records reviewed and summated above. Stroke: Continue secondary stroke prophylaxis and Risk Factor Modification listed below:   Antiplatelet therapy:   Blood Pressure Management:  Continue current medication with prn's with permisive HTN per primary team Statin Agent:   Prediabetes management: Right sided hemiparesis:  Motor recovery: Fluoxetine  1. Does the need for close, 24 hr/day medical supervision in concert with the patient's rehab needs make it unreasonable for this patient to be  served in a less intensive setting? No  2. Co-Morbidities requiring supervision/potential complications: dysphagia (advance diet as tolerated), chronic LBP (Biofeedback training with therapies to help reduce reliance on opiate pain medications, monitor pain control during therapies, and sedation at rest and titrate to maximum efficacy to ensure participation and gains in therapies), AKI on CKD (avoid nephrotoxic meds), L-MCA infarct s/p revascularization  with hemorrhagic transformation with resultant right sided weakness, aphasia, AFib (monitor HR with increased mobility), ABLA (transfuse if necessary to ensure appropriate perfusion for increased activity tolerance) 3. Due to bladder management, bowel management, safety, skin/wound care, disease management, medication administration and patient education, does the patient require 24 hr/day rehab nursing? Yes 4. Does the patient require coordinated care of a physician, rehab nurse, PT (1-2 hrs/day, 5 days/week), OT (1-2 hrs/day, 5 days/week) and SLP (1-2 hrs/day, 5 days/week) to address physical and functional deficits in the context of the above medical diagnosis(es)? Yes Addressing deficits in the following areas: balance, endurance, locomotion, strength, transferring, bathing, dressing, toileting, cognition, speech, swallowing and psychosocial support 5. Can the patient actively participate in an intensive therapy program of at least 3 hrs of therapy per day at least 5 days per week? Yes 6. The potential for patient to make measurable gains while on inpatient rehab is good 7. Anticipated functional outcomes upon discharge from inpatient rehab are min assist  with PT, min assist with OT, min assist with SLP. 8. Estimated rehab length of stay to reach the above functional goals is: 7-11 days. 9. Anticipated D/C setting: Other 10. Anticipated post D/C treatments: SNF 11. Overall Rehab/Functional Prognosis: good  RECOMMENDATIONS: This patient's condition is appropriate for continued rehabilitative care in the following setting: ?Ability to care for patient at home.  Pt with recent rehab stay and will likely be close to previous level of functioning.  If unable to caare for patient at home recommend SNF with PM&R follow up. Patient has agreed to participate in recommended program. Potentially Note that insurance prior authorization may be required for reimbursement for recommended care.  Comment:  Rehab Admissions Coordinator to follow up.  Delice Lesch, MD, Mellody Drown [place], [place] 01/13/2017    Revision History                        Routing History

## 2017-01-15 NOTE — IPOC Note (Signed)
Overall Plan of Care [facility]) Patient Details Name: Valerie Mcclure MRN: [id number] DOB: 08-03-1940  Admitting Diagnosis: CVA  Hospital Problems: Active Problems:   Acute ischemic left MCA stroke [facility])     Functional Problem List: Nursing Bladder, Bowel, Behavior, Perception, Safety, Endurance, Skin Integrity, Medication Management, Motor, Nutrition  PT Balance, Endurance, Motor, Perception, Safety, Sensory  OT Balance, Behavior, Safety, Cognition, Edema, Endurance, Motor, Pain, Perception, Vision  SLP Cognition, Linguistic, Nutrition  TR         Basic ADL's: OT Grooming, Bathing, Dressing, Toileting     Advanced  ADL's: OT       Transfers: PT Bed Mobility, Bed to Chair, Car, [employer], Futures trader, Metallurgist: PT Ambulation, Emergency planning/management officer, Stairs     Additional Impairments: OT Fuctional Use of Upper Extremity  SLP Swallowing, Communication comprehension, expression    TR      Anticipated Outcomes Item Anticipated Outcome  Self Feeding setup   Swallowing  Min A   Basic self-care  supervision - min A   Toileting  min A    Bathroom Transfers min A   Bowel/Bladder  Continent of bowel and bladder   Transfers  supervision  Locomotion  ambulatory with min assist  Communication  Mod A  Cognition  Mod A  Pain  no c/o pain  Safety/Judgment      Therapy Plan: PT Intensity: Minimum of 1-2 x/day ,45 to 90 minutes PT Frequency: 5 out of 7 days PT Duration Estimated Length of Stay:  (3-5 days) OT Intensity: Minimum of 1-2 x/day, 45 to 90 minutes OT Frequency: 5 out of 7 days OT Duration/Estimated Length of Stay: ~3-5 days SLP Intensity: Minumum of 1-2 x/day, 30 to 90 minutes SLP Frequency: 3 to 5 out of 7 days SLP Duration/Estimated Length of Stay: 3 to 5 days       Team Interventions: Nursing Interventions Patient/Family Education, Disease Management/Prevention, Discharge Planning, Psychosocial Support, Pain Management,  Bladder Management, Bowel Management, Medication Management, Dysphagia/Aspiration Precaution Training, Cognitive Remediation/Compensation, Skin Care/Wound Management  PT interventions Ambulation/gait training, Balance/vestibular training, Cognitive remediation/compensation, Community reintegration, Discharge planning, Disease management/prevention, DME/adaptive equipment instruction, Functional electrical stimulation, Functional mobility training, Neuromuscular re-education, Patient/family education, Psychosocial support, Splinting/orthotics, Stair training, Therapeutic Activities, Therapeutic Exercise, UE/LE Strength taining/ROM, UE/LE Coordination activities, Visual/perceptual remediation/compensation, Wheelchair propulsion/positioning  OT Interventions Training and development officer, Cognitive remediation/compensation, Community reintegration, Discharge planning, Disease mangement/prevention, DME/adaptive equipment instruction, Functional electrical stimulation, Functional mobility training, Neuromuscular re-education, Pain management, Patient/family education, Psychosocial support, Self Care/advanced ADL retraining, Skin care/wound managment, Splinting/orthotics, Therapeutic Activities, Therapeutic Exercise, UE/LE Strength taining/ROM, UE/LE Coordination activities, Visual/perceptual remediation/compensation, Wheelchair propulsion/positioning  SLP Interventions Cognitive remediation/compensation, English as a second language teacher, Dysphagia/aspiration precaution training, Environmental controls, Functional tasks, Internal/external aids, Patient/family education, Speech/Language facilitation  TR Interventions    SW/CM Interventions Discharge Planning, Psychosocial Support, Patient/Family Education    Team Discharge Planning: Destination: PT-Home ,OT- Home , SLP-Home Projected Follow-up: PT-Home health PT, OT-  Home health OT, 24 hour supervision/assistance, SLP-Home Health SLP, Outpatient SLP, 24 hour  supervision/assistance Projected Equipment Needs: PT-None recommended by PT, OT- 3 in 1 bedside comode, Tub/shower bench, SLP-None recommended by SLP Equipment Details: PT-pt reports having walker and wheelchair at home, OT-  Patient/family involved in discharge planning: PT- Patient,  OT-Patient, SLP-Patient  MD ELOS: 3-5 days. Medical Rehab Prognosis:  Good Assessment: 76 y.o. female with history of chronic LBP, CKD, L-MCA infarct s/p revascularization with hemorrhagic transformation with resultant right sided weakness, aphasia and inattention and new  diagnosis of AFib. She underwent CIR  stay complicated by encephalopathy due to narcotics (discontiued)  and was discharged to home 6/21 at min assist level. She was readmitted 01/11/17 with increase in aphasia and worsening of right sided weakness. She had not received Eliquis X 2 days at home and MS contin had been resumed. Daughters expressed concerns about safety at home with illicit drug use and heroin overdose by a family member recently. UDS positive for opiates. She was started on IV fluid for hydration and  MRI brain done showing interval acute/early subacute infarct in left caudate extending to periventricular and left medial thalamus, left anterior temporal lobe encephalomalacia, decrease in size of left basal ganglia subacute hematoma. TPA not administered due to recent ICH.  Dr. [name] recommended continuing Eliquis and compliance with antithrombotic medications. MBS done to evaluate swallow and patient on dysphagia 2, nectar liquids due to dysphagia and aspiration risk. Cognitive evaluation done revealing perseverative language with moderate expressive aphasia and comprehension 60% intact for biographic and Y/N questions. Therapy initiated and patient limited by decrease in RUE motor planning, decreased balance, aphasia, RLE ataxia and weakness causing decline in mobility and ability to carry out ADL tasks. Will set goals for Min A with PT/OT and  Mod A with SLP.    See Team Conference Notes for weekly updates to the plan of care

## 2017-01-15 NOTE — Progress Notes (Signed)
Initial Nutrition Assessment  DOCUMENTATION CODES:   Not applicable  INTERVENTION:    Mighty Shake II with supper daily, each supplement provides 480-500 kcals and 20-23 grams of protein  NUTRITION DIAGNOSIS:   Inadequate oral intake related to dysphagia as evidenced by per patient/family report.  GOAL:   Patient will meet greater than or equal to 90% of their needs  MONITOR:   PO intake, Supplement acceptance, Labs, I & O's, Weight trends  REASON FOR ASSESSMENT:   Malnutrition Screening Tool    ASSESSMENT:   76 yo female with PMH of HTN, HLD, osteopenia, recent L-MCA infarct s/p revascularization with resultant right sided weakness who was admitted to CIR on 6/28 for further therapy, given worsening weakness with apraxia, dysphagia, and acute renal failure.  Patient and her family reports that she has been losing weight, usually 160 lbs, down to 152 lbs, however, today's weight is 159 lbs.  She was eating lunch during RD visit, good appetite. She only consumed 25% of breakfast. Nutrition-Focused physical exam completed. Findings are no fat depletion, no muscle depletion, and no edema.  Family requests that patient receive PO supplements to improve her intake. Labs and medications reviewed.   Diet Order:  DIET DYS 3 Room service appropriate? Yes; Fluid consistency: Nectar Thick  Skin:  Reviewed, no issues  Last BM:  6/28  Height:   Ht Readings from Last 1 Encounters:  01/13/17 5\' 3"  (1.6 m)    Weight:   Wt Readings from Last 1 Encounters:  01/15/17 159 lb 11.2 oz (72.4 kg)    Ideal Body Weight:  52.3 kg  BMI:  Body mass index is 28.29 kg/m.  Estimated Nutritional Needs:   Kcal:  1400-1600  Protein:  75-85 gm  Fluid:  >/= 1.5 L  EDUCATION NEEDS:   No education needs identified at this time  Molli Barrows, [place], [place], [place] Pager [phone] After Hours Pager [phone]

## 2017-01-15 NOTE — Telephone Encounter (Signed)
Pt was on TCM list admitted 01/11/17 for Aphasia as late effect of stroke. Pt was discharge 01/14/17 to inpatient rehab unit for further care and rehabilitation. Per D/C summary will need follow up with PCP in 2 weeks after discharge from rehab...Johny Chess

## 2017-01-16 ENCOUNTER — Inpatient Hospital Stay (HOSPITAL_COMMUNITY): Payer: Medicare Other | Admitting: Occupational Therapy

## 2017-01-16 ENCOUNTER — Encounter (HOSPITAL_COMMUNITY): Payer: Self-pay | Admitting: *Deleted

## 2017-01-16 ENCOUNTER — Inpatient Hospital Stay (HOSPITAL_COMMUNITY): Payer: Medicare Other | Admitting: Physical Therapy

## 2017-01-16 DIAGNOSIS — G8191 Hemiplegia, unspecified affecting right dominant side: Secondary | ICD-10-CM

## 2017-01-16 NOTE — Progress Notes (Signed)
PHYSICAL MEDICINE & REHABILITATION     PROGRESS NOTE  Subjective/Complaints:  Pt seen laying in bed this AM. She slept well overnight, not notes diarrhea because she had too much coffee yesterday.   ROS: Denies CP, SOB, N/V/D>  Objective: Vital Signs: Blood pressure (!) 170/75, pulse 66, temperature 97.9 F (36.6 C), temperature source Oral, resp. rate 18, weight 73 kg (161 lb), SpO2 94 %. No results found.  Recent Labs  01/15/17 0518  WBC 5.4  HGB 8.9*  HCT 27.9*  PLT 237    Recent Labs  01/14/17 0522 01/15/17 0518  NA 142 143  K 3.9 3.5  CL 113* 113*  GLUCOSE 104* 107*  BUN 21* 15  CREATININE 1.25* 1.17*  CALCIUM 8.6* 8.3*   CBG (last 3)  No results for input(s): GLUCAP in the last 72 hours.  Wt Readings from Last 3 Encounters:  01/16/17 73 kg (161 lb)  01/13/17 79.8 kg (176 lb)  01/07/17 69.3 kg (152 lb 11.2 oz)    Physical Exam:  BP (!) 170/75 (BP Location: Left Arm)   Pulse 66   Temp 97.9 F (36.6 C) (Oral)   Resp 18   Wt 73 kg (161 lb)   SpO2 94%   BMI 28.52 kg/m  Head: Normocephalic and atraumatic.  Eyes: EOMI. No discharge.  Cardiovascular: Normal rate and regular rhythm.  No JVD. Respiratory: Effort normal and breath sounds normal.  GI: Soft. Bowel sounds are normal.   Musculoskeletal: She exhibits no edema or tenderness.  Neurological: She is alert and oriented.  Right facial weakness.  Mixed aphasia expressive aphasia Motor: RUE/RLE 4+/5 with apraxia  Skin: Skin is warm and dry. She is not diaphoretic.  Psychiatric: She has a normal mood and affect. Her behavior is normal.     Assessment/Plan: 1. Functional deficits secondary to left CVA which require 3+ hours per day of interdisciplinary therapy in a comprehensive inpatient rehab setting. Physiatrist is providing close team supervision and 24 hour management of active medical problems listed below. Physiatrist and rehab team continue to assess barriers to discharge/monitor  patient progress toward functional and medical goals.  Function:  Bathing Bathing position   Position: Shower  Bathing parts Body parts bathed by patient: Right arm, Left arm, Chest, Abdomen, Front perineal area, Right upper leg, Left upper leg, Right lower leg, Left lower leg Body parts bathed by helper: Buttocks  Bathing assist Assist Level: Touching or steadying assistance(Pt > 75%)      Upper Body Dressing/Undressing Upper body dressing   What is the patient wearing?: Pull over shirt/dress     Pull over shirt/dress - Perfomed by patient: Thread/unthread left sleeve, Put head through opening, Pull shirt over trunk          Upper body assist Assist Level: Touching or steadying assistance(Pt > 75%)      Lower Body Dressing/Undressing Lower body dressing   What is the patient wearing?: Pants, Shoes       Pants- Performed by helper: Thread/unthread right pants leg, Thread/unthread left pants leg, Pull pants up/down         Shoes - Performed by patient: Don/doff left shoe Shoes - Performed by helper: Don/doff right shoe          Lower body assist Assist for lower body dressing: Touching or steadying assistance (Pt > 75%)      Toileting Toileting Toileting activity did not occur: No continent bowel/bladder event        Toileting  assist     Transfers Chair/bed transfer   Chair/bed transfer method: Stand pivot Chair/bed transfer assist level: Touching or steadying assistance (Pt > 75%) Chair/bed transfer assistive device: Armrests, Medical sales representative     Max distance: 8 ft Assist level: Touching or steadying assistance (Pt > 75%)   Wheelchair   Type: Manual Max wheelchair distance: 12 ft Assist Level: Moderate assistance (Pt 50 - 74%)  Cognition Comprehension Comprehension assist level: Understands basic 90% of the time/cues < 10% of the time  Expression Expression assist level: Expresses basic 50 - 74% of the time/requires cueing 25 -  49% of the time. Needs to repeat parts of sentences.  Social Interaction Social Interaction assist level: Interacts appropriately 90% of the time - Needs monitoring or encouragement for participation or interaction.  Problem Solving Problem solving assist level: Solves basic 50 - 74% of the time/requires cueing 25 - 49% of the time  Memory Memory assist level: Recognizes or recalls 50 - 74% of the time/requires cueing 25 - 49% of the time    Medical Problem List and Plan: 1.  Decrease mobility, apraxia, decreased balance, aphasia, ataxia secondary to left CVA.  Cont CIR 2.  DVT Prophylaxis/Anticoagulation: Pharmaceutical: Other (comment)--Eliquis 3. Pain Management: tylenol prn. No issues with pain at prior admission. Continue pamelor at bedtime 4. Mood:  LCSW to follow for evaluation and support.  5. Neuropsych: This patient is not fully capable of making decisions on his own behalf. 6. Skin/Wound Care: routine pressure relief measures 7. Fluids/Electrolytes/Nutrition:  Monitor I/Os. Encourage fluid intake             Now on nectar liquids.    8. HTN: Avoid ACE/ARB with history of CKD as well as issues with oral intake. Monitor BP-- bid.              Continue metoprolol bid.             Monitor with increased mobility 9. Mild leucocytosis: Resolved             WBCs 5.4 on 6/29 10. Dysphagia:              IVF d/ced, encouraged oral fluids.   Cont to monitor 11. Dyslipidemia: On statin.  12. CKD              Cr 1.17 on 6/29            Labs ordered for Monday             Cont to monitor 13. Hypoalbuminemia             Supplement initiated 6/29 14. ABLA             Hb 8.9 on 6/29             Cont to monitor  LOS (Days) 2 A FACE TO FACE EVALUATION WAS PERFORMED  Valerie Mcclure Lorie Phenix 01/16/2017 7:50 AM

## 2017-01-16 NOTE — Progress Notes (Signed)
Occupational Therapy Session Note  Patient Details  Name: Valerie Mcclure MRN: [id number] Date of Birth: 1941/05/30  Today's Date: 01/16/2017 OT Individual Time: 4132-4401 and 1300-1400 OT Individual Time Calculation (min): 59 min and 60 min   Short Term Goals: Week 1:  OT Short Term Goal 1 (Week 1): LTG=SSTG  Skilled Therapeutic Interventions/Progress Updates:    1) Treatment session with focus on ADL retraining with functional mobility, sit > stand, and dynamic standing balance during bathing and dressing tasks.  Pt ambulated to room shower with min assist/HHA.  Completed bathing with increased time and cues to visually attend to RUE while utilizing it to wash, as pt would drop cloth x2.  Pt incontinent of bowel (unaware) during shower.  Assist provided to ensure proper hygiene post BM.  Completed dressing at sit > stand level with pt requiring increased time to problem solve LB dressing, able to thread BLE with increased time and able to pull pants over hips requiring adjustment on Rt to fully over hips.  Increased time with UB dressing, however able to complete all steps with exception of fastening bra.  Pt incontinent of bowel again after dressed, changed brief with min assist at sit > stand level.  Left upright in recliner with all needs in reach.  2) Treatment session with focus on functional use of RUE.  Pt propelled w/c with use of BLE for strengthening and Rt attention.  Stand pivot transfers completed with min assist with increased time for anterior weight shift.  Engaged in chest presses and supination/pronation with BUE on ball to promote symmetrical movements, requiring hand over hand to maintain positioning of RUE to increase proper technique and ROM.  Engaged in table top sorting task, with pt able to pick up colored dots with increased time and min cues to visually attend to RUE.  Pt with improved success with functional tasks.  Engaged in picking up cup for drinking and folding  pillow case, requiring mod cues to utilize RUE with tasks and benefiting from demonstration and verbal cues to increase success.  Returned to room and left upright in w/c with pt's husband and daughter present.  Discussed planned family education for Monday.  Therapy Documentation Precautions:  Precautions Precautions: Fall Precaution Comments: R sided weakness, R inattention Restrictions Weight Bearing Restrictions: No General:   Vital Signs: Therapy Vitals Temp: 97.9 F (36.6 C) Temp Source: Oral Pulse Rate: 66 Resp: 18 BP: (!) 170/75 Patient Position (if appropriate): Lying Oxygen Therapy SpO2: 94 % O2 Device: Not Delivered Pain:  Pt with no c/o pain ADL: ADL ADL Comments: see functional navigator  See Function Navigator for Current Functional Status.   Therapy/Group: Individual Therapy  Simonne Come 01/16/2017, 9:50 AM

## 2017-01-16 NOTE — Progress Notes (Signed)
Stool sent down this am was not enough. Reordered stool for C-diff.

## 2017-01-16 NOTE — Progress Notes (Signed)
Physical Therapy Session Note  Patient Details  Name: Valerie Mcclure MRN: [id number] Date of Birth: 05/02/1941  Today's Date: 01/16/2017 PT Individual Time: 1045-1200 PT Individual Time Calculation (min): 75 min   Short Term Goals: Week 1:  PT Short Term Goal 1 (Week 1): STG=LTG  Skilled Therapeutic Interventions/Progress Updates: Pt presented in w/c agreeable to therapy. Performed w/c propulsion using BLE 100ft for LE strengthening and endurance. Stand pivot e/c to mat with min A cues for hand placement and sequencing. Performed standing balance activities including toe taps to target, static reaching with LUE and forced use of gross movement of RUE. Pt able to maintain good  balance with no LOB. Performed sit to/from stand with LLE on 2in step for RLE strengthening and increased forced use. Gait with RW x 30ft with limited use of RUE and difficulty maintaining placement on RW. Trialed SBQC x 30ft, x 60ft, minA. Pt required mod cues for sequencing and use of QC however able to maintain fait balance. Pt returned to room via w/c and remained in w/c at end of session with needs met and call bell within reach.      Therapy Documentation Precautions:  Precautions Precautions: Fall Precaution Comments: R sided weakness, R inattention Restrictions Weight Bearing Restrictions: No General:   Vital Signs: Therapy Vitals Temp: 98 F (36.7 C) Temp Source: Oral Pulse Rate: 62 Resp: 18 BP: (!) 177/65 Patient Position (if appropriate): Lying Oxygen Therapy SpO2: 100 % O2 Device: Not Delivered   See Function Navigator for Current Functional Status.   Therapy/Group: Individual Therapy      , PTA   01/16/2017, 4:16 PM  

## 2017-01-17 ENCOUNTER — Inpatient Hospital Stay (HOSPITAL_COMMUNITY): Payer: Medicare Other | Admitting: Occupational Therapy

## 2017-01-17 MED ORDER — AMLODIPINE BESYLATE 5 MG PO TABS
5.0000 mg | ORAL_TABLET | Freq: Every day | ORAL | Status: DC
  Administered 2017-01-17 – 2017-01-18 (×2): 5 mg via ORAL
  Filled 2017-01-17 (×2): qty 1

## 2017-01-17 NOTE — Progress Notes (Signed)
Occupational Therapy Session Note  Patient Details  Name: Valerie Mcclure MRN: [id number] Date of Birth: July 23, 1940  Today's Date: 01/17/2017 OT Individual Time:  - 755-855  1st session                                         1430-1530  (60 min)  2nd session      Short Term Goals: Week 1:  OT Short Term Goal 1 (Week 1): LTG=SSTG  Skilled Therapeutic Interventions/Progress Updates:   1st  Session:   Treatment session with focus on ADL retraining with functional mobility, sit > stand, and dynamic standing balance during bathing and dressing tasks.  Pt ambulated to sink with Bowel incontinence.  Ambulated to toilet and completed BM.   Needed cues to attend to right pants when pulling up over hips.    Ambulated back to sink and stood to brush teeth with min assist.  Required cues to use RUE with multiple dropping of clothe.  Provided max cues to visually attend to washcloth.  Ppt stood to wash anterior peri but needed assit with posterior.[place] dressing with mod cues to correctly don pants and assist and pull up on right hip.  Donned bra with assist with hooks in back.    Finished and left in wc with all needs. In reach.     2nd session:  [place] with forced use therapy techniques.  Used nustep for 30 mins on level one with RUE and LLE for NMRE and attention to right UE.  Pt attended to right for 15-20 sec before RUE would slid off hand bar.  Did Push/pull with RUE and RLE with mod assist for initiation and getting started.  Transferred to nustep with min assist:  Transferred to mat with min cues for full rotation before sitting.  Performed RUE Push/pull in frontal plane, sagittal plane.  Pt performed with mod cues for maintaining grip and attention.to RUE Transferred back to wc and left in room with daughter and hustand.     Therapy Documentation Precautions:  Precautions Precautions: Fall Precaution Comments: R sided weakness, R inattention Restrictions Weight Bearing  Restrictions: No General:   Vital Signs: Therapy Vitals Temp: 98.8 F (37.1 C) Temp Source: Oral Pulse Rate: 60 Resp: 18 BP: (!) 161/69 Patient Position (if appropriate): Lying Oxygen Therapy SpO2: 100 % O2 Device: Not Delivered Pain: right thumb pain briefly when jammed into brake on wc otherwise none   ADL: ADL ADL Comments: see functional navigator Vision  See Function Navigator for Current Functional Status.   Therapy/Group: Individual Therapy  [name], [name] 01/17/2017, 7:01 AM

## 2017-01-17 NOTE — Progress Notes (Signed)
[place] PHYSICAL MEDICINE & REHABILITATION     PROGRESS NOTE  Subjective/Complaints:  Patient seen lying in bed this morning. She states she slept well overnight. She informs me that she was told that she is Tuesday.  ROS: Denies CP, SOB, N/V/D.  Objective: Vital Signs: Blood pressure (!) 161/69, pulse 60, temperature 98.8 F (37.1 C), temperature source Oral, resp. rate 18, weight 71.9 kg (158 lb 9.6 oz), SpO2 100 %. No results found.  Recent Labs  01/15/17 0518  WBC 5.4  HGB 8.9*  HCT 27.9*  PLT 237    Recent Labs  01/15/17 0518  NA 143  K 3.5  CL 113*  GLUCOSE 107*  BUN 15  CREATININE 1.17*  CALCIUM 8.3*   CBG (last 3)  No results for input(s): GLUCAP in the last 72 hours.  Wt Readings from Last 3 Encounters:  01/17/17 71.9 kg (158 lb 9.6 oz)  01/13/17 79.8 kg (176 lb)  01/07/17 69.3 kg (152 lb 11.2 oz)    Physical Exam:  BP (!) 161/69 (BP Location: Right Arm)   Pulse 60   Temp 98.8 F (37.1 C) (Oral)   Resp 18   Wt 71.9 kg (158 lb 9.6 oz)   SpO2 100%   BMI 28.09 kg/m  Head: Normocephalic and atraumatic.  Eyes: EOMI. No discharge.  Cardiovascular: RRR.  No JVD. Respiratory: Effort normal and breath sounds normal.  GI: Soft. Bowel sounds are normal.   Musculoskeletal: She exhibits no edema or tenderness.  Neurological: She is alert and oriented.  Right facial weakness.  Predominantly expressive aphasia, improving Motor: RUE/RLE 4+/5 with apraxia  Skin: Skin is warm and dry. She is not diaphoretic.  Psychiatric: She has a normal mood and affect. Her behavior is normal.     Assessment/Plan: 1. Functional deficits secondary to left CVA which require 3+ hours per day of interdisciplinary therapy in a comprehensive inpatient rehab setting. Physiatrist is providing close team supervision and 24 hour management of active medical problems listed below. Physiatrist and rehab team continue to assess barriers to discharge/monitor patient progress  toward functional and medical goals.  Function:  Bathing Bathing position   Position: Shower  Bathing parts Body parts bathed by patient: Right arm, Left arm, Chest, Abdomen, Front perineal area, Right upper leg, Left upper leg, Right lower leg, Left lower leg Body parts bathed by helper: Buttocks  Bathing assist Assist Level: Touching or steadying assistance(Pt > 75%)      Upper Body Dressing/Undressing Upper body dressing   What is the patient wearing?: Bra, Pull over shirt/dress Bra - Perfomed by patient: Thread/unthread right bra strap, Thread/unthread left bra strap Bra - Perfomed by helper: Hook/unhook bra (pull down sports bra) Pull over shirt/dress - Perfomed by patient: Thread/unthread left sleeve, Put head through opening, Pull shirt over trunk, Thread/unthread right sleeve          Upper body assist Assist Level: Touching or steadying assistance(Pt > 75%)      Lower Body Dressing/Undressing Lower body dressing   What is the patient wearing?: Pants, Non-skid slipper socks     Pants- Performed by patient: Thread/unthread right pants leg, Thread/unthread left pants leg Pants- Performed by helper: Pull pants up/down   Non-skid slipper socks- Performed by helper: Don/doff right sock, Don/doff left sock     Shoes - Performed by patient: Don/doff left shoe Shoes - Performed by helper: Don/doff right shoe          Lower body assist Assist for lower  body dressing: Touching or steadying assistance (Pt > 75%) (Max assist)      Toileting Toileting Toileting activity did not occur: No continent bowel/bladder event Toileting steps completed by patient: Performs perineal hygiene Toileting steps completed by helper: Adjust clothing prior to toileting, Adjust clothing after toileting Toileting Assistive Devices: Grab bar or rail  Toileting assist Assist level: Touching or steadying assistance (Pt.75%)   Transfers Chair/bed transfer   Chair/bed transfer method:  Ambulatory Chair/bed transfer assist level: Touching or steadying assistance (Pt > 75%) Chair/bed transfer assistive device: Armrests, Medical sales representative     Max distance: 6ft Assist level: Touching or steadying assistance (Pt > 75%)   Wheelchair   Type: Manual Max wheelchair distance: 12 ft Assist Level: Moderate assistance (Pt 50 - 74%)  Cognition Comprehension Comprehension assist level: Understands basic 90% of the time/cues < 10% of the time  Expression Expression assist level: Expresses basic 90% of the time/requires cueing < 10% of the time.  Social Interaction Social Interaction assist level: Interacts appropriately 90% of the time - Needs monitoring or encouragement for participation or interaction.  Problem Solving Problem solving assist level: Solves basic 90% of the time/requires cueing < 10% of the time  Memory Memory assist level: Recognizes or recalls 90% of the time/requires cueing < 10% of the time    Medical Problem List and Plan: 1.  Decrease mobility, apraxia, decreased balance, aphasia, ataxia secondary to left CVA.  Cont CIR 2.  DVT Prophylaxis/Anticoagulation: Pharmaceutical: Other (comment)--Eliquis 3. Pain Management: tylenol prn. No issues with pain at prior admission. Continue pamelor at bedtime 4. Mood:  LCSW to follow for evaluation and support.  5. Neuropsych: This patient is not fully capable of making decisions on his own behalf. 6. Skin/Wound Care: routine pressure relief measures 7. Fluids/Electrolytes/Nutrition:  Monitor I/Os. Encourage fluid intake             Now on D3 nectar liquids.    8. HTN: Avoid ACE/ARB with history of CKD as well as issues with oral intake. Monitor BP-- bid.              Continue metoprolol bid.             Norvasc 5 started on 7/1 9. Mild leucocytosis: Resolved             WBCs 5.4 on 6/29 10. Dysphagia:              IVF d/ced, encouraged oral fluids.   Cont to monitor 11. Dyslipidemia: On statin.   12. CKD              Cr 1.17 on 6/29            Labs ordered for Monday             Cont to monitor 13. Hypoalbuminemia             Supplement initiated 6/29 14. ABLA             Hb 8.9 on 6/29  Labs ordered for tomorrow             Cont to monitor  LOS (Days) 3 A FACE TO FACE EVALUATION WAS PERFORMED  Valerie Mcclure Valerie Mcclure 01/17/2017 7:59 AM

## 2017-01-17 NOTE — Progress Notes (Signed)
Occupational Therapy Session Note  Patient Details  Name: Valerie Mcclure MRN: [id number] Date of Birth: [birth year]  Today's Date: 01/17/2017 OT Individual Time: 1110-1207 OT Individual Time Calculation (min): 57 min    Short Term Goals: Week 1:  OT Short Term Goal 1 (Week 1): LTG=SSTG  Skilled Therapeutic Interventions/Progress Updates:    Tx focus on Rt NMR, standing balance, and UE coordination during meaningful tasks.   Pt greeted in w/c, smiling, self propelling to door to meet therapist. Pt self propelled with bilateral LEs to RN station, escorted pt to dayroom for time mgt. Once in dayroom, had pt sit/stand while clapping in beat to meaningful 4th of July music. Pt requiring HOH for bilateral coordination of UEs 50% of time, but visibly attempting to mimic rhythm, by either clapping hands or thighs. Coordination abilities decreasing with fatigue. When pt stood, she was able to activate triceps to resist graded forces from therapist to Rt arm. Pt dancing with therapist for 3 minute windows before fatiguing, either weightbearing through bilateral UEs while swaying hips, or pushing therapist's hand and weightbearing with unilateral support on elevated table. While seated, pt squeezing yellow resistive sponge with Rt hand in beat to "[place]." Pt verbalizing words/phrases in this song, as well as lyrics to [place]. Pt laughing and smiling during tx, evidence of tx positively impacting her psychosocial wellbeing. Pt was then escorted 3/4 way back to room. Had her attempt to propel with bilateral UEs back to room, pt requiring max HOH assist from therapist due to pt neglecting arm and allowing it to drop towards wheel. Pt was set up for lunch and left with all needs a time of departure.   Therapy Documentation Precautions:  Precautions Precautions: Fall Precaution Comments: R sided weakness, R inattention Restrictions Weight Bearing Restrictions:  No  Pain: No c/o pain during tx    ADL: ADL ADL Comments: see functional navigator    See Function Navigator for Current Functional Status.   Therapy/Group: Individual Therapy  Dreux Mcgroarty A Lilana Blasko 01/17/2017, 12:45 PM

## 2017-01-18 ENCOUNTER — Inpatient Hospital Stay (HOSPITAL_COMMUNITY): Payer: Medicare Other | Admitting: Occupational Therapy

## 2017-01-18 ENCOUNTER — Inpatient Hospital Stay (HOSPITAL_COMMUNITY): Payer: Medicare Other | Admitting: Physical Therapy

## 2017-01-18 ENCOUNTER — Inpatient Hospital Stay (HOSPITAL_COMMUNITY): Payer: Medicare Other | Admitting: Speech Pathology

## 2017-01-18 ENCOUNTER — Inpatient Hospital Stay (HOSPITAL_COMMUNITY): Payer: Medicare Other

## 2017-01-18 DIAGNOSIS — E876 Hypokalemia: Secondary | ICD-10-CM

## 2017-01-18 DIAGNOSIS — R739 Hyperglycemia, unspecified: Secondary | ICD-10-CM

## 2017-01-18 LAB — CBC
HEMATOCRIT: 30.4 % — AB (ref 36.0–46.0)
Hemoglobin: 9.9 g/dL — ABNORMAL LOW (ref 12.0–15.0)
MCH: 28.8 pg (ref 26.0–34.0)
MCHC: 32.6 g/dL (ref 30.0–36.0)
MCV: 88.4 fL (ref 78.0–100.0)
Platelets: 222 10*3/uL (ref 150–400)
RBC: 3.44 MIL/uL — ABNORMAL LOW (ref 3.87–5.11)
RDW: 13.9 % (ref 11.5–15.5)
WBC: 8.4 10*3/uL (ref 4.0–10.5)

## 2017-01-18 LAB — BASIC METABOLIC PANEL
Anion gap: 8 (ref 5–15)
BUN: 11 mg/dL (ref 6–20)
CHLORIDE: 109 mmol/L (ref 101–111)
CO2: 24 mmol/L (ref 22–32)
Calcium: 8.2 mg/dL — ABNORMAL LOW (ref 8.9–10.3)
Creatinine, Ser: 1.07 mg/dL — ABNORMAL HIGH (ref 0.44–1.00)
GFR calc Af Amer: 57 mL/min — ABNORMAL LOW (ref >60)
GFR calc non Af Amer: 49 mL/min — ABNORMAL LOW (ref >60)
Glucose, Bld: 130 mg/dL — ABNORMAL HIGH (ref 65–99)
POTASSIUM: 2.8 mmol/L — AB (ref 3.5–5.1)
SODIUM: 141 mmol/L (ref 135–145)

## 2017-01-18 LAB — C DIFFICILE QUICK SCREEN W PCR REFLEX
C DIFFICILE (CDIFF) INTERP: NOT DETECTED
C DIFFICILE (CDIFF) TOXIN: NEGATIVE
C Diff antigen: NEGATIVE

## 2017-01-18 MED ORDER — POTASSIUM CHLORIDE CRYS ER 20 MEQ PO TBCR
20.0000 meq | EXTENDED_RELEASE_TABLET | Freq: Every day | ORAL | Status: DC
  Administered 2017-01-18 – 2017-01-19 (×2): 20 meq via ORAL
  Filled 2017-01-18: qty 1

## 2017-01-18 MED ORDER — POTASSIUM CHLORIDE CRYS ER 20 MEQ PO TBCR
40.0000 meq | EXTENDED_RELEASE_TABLET | Freq: Two times a day (BID) | ORAL | Status: AC
  Administered 2017-01-18 (×2): 40 meq via ORAL
  Filled 2017-01-18 (×3): qty 2

## 2017-01-18 NOTE — Progress Notes (Signed)
[place] PHYSICAL MEDICINE & REHABILITATION     PROGRESS NOTE  Subjective/Complaints:  Pt seen laying in bed this AM.  She slept well overnight.  ROS: Denies CP, SOB, N/V/D.  Objective: Vital Signs: Blood pressure (!) 143/75, pulse 69, temperature 97.8 F (36.6 C), temperature source Oral, resp. rate 18, weight 71.6 kg (157 lb 14.4 oz), SpO2 98 %. No results found.  Recent Labs  01/18/17 0425  WBC 8.4  HGB 9.9*  HCT 30.4*  PLT 222    Recent Labs  01/18/17 0425  NA 141  K 2.8*  CL 109  GLUCOSE 130*  BUN 11  CREATININE 1.07*  CALCIUM 8.2*   CBG (last 3)  No results for input(s): GLUCAP in the last 72 hours.  Wt Readings from Last 3 Encounters:  01/18/17 71.6 kg (157 lb 14.4 oz)  01/13/17 79.8 kg (176 lb)  01/07/17 69.3 kg (152 lb 11.2 oz)    Physical Exam:  BP (!) 143/75 (BP Location: Left Arm)   Pulse 69   Temp 97.8 F (36.6 C) (Oral)   Resp 18   Wt 71.6 kg (157 lb 14.4 oz)   SpO2 98%   BMI 27.97 kg/m  Head: Normocephalic and atraumatic.  Eyes: EOMI. No discharge.  Cardiovascular: RRR.  No JVD. Respiratory: Effort normal and breath sounds normal.  GI: Soft. Bowel sounds are normal.   Musculoskeletal: She exhibits no edema or tenderness.  Neurological: She is alert and oriented.  Right facial weakness.  Predominantly expressive aphasia, improving Motor: RUE/RLE 4+/5 with apraxia (stable) Skin: Skin is warm and dry. She is not diaphoretic. Mild facial flushing. Psychiatric: She has a normal mood and affect. Her behavior is normal.     Assessment/Plan: 1. Functional deficits secondary to left CVA which require 3+ hours per day of interdisciplinary therapy in a comprehensive inpatient rehab setting. Physiatrist is providing close team supervision and 24 hour management of active medical problems listed below. Physiatrist and rehab team continue to assess barriers to discharge/monitor patient progress toward functional and medical  goals.  Function:  Bathing Bathing position   Position: Wheelchair/chair at sink  Bathing parts Body parts bathed by patient: Right arm, Left arm, Chest, Abdomen, Front perineal area, Right upper leg, Left upper leg, Right lower leg, Left lower leg Body parts bathed by helper: Buttocks  Bathing assist Assist Level: Touching or steadying assistance(Pt > 75%)      Upper Body Dressing/Undressing Upper body dressing Upper body dressing/undressing activity did not occur: N/A What is the patient wearing?: Bra, Pull over shirt/dress Bra - Perfomed by patient: Thread/unthread right bra strap, Thread/unthread left bra strap Bra - Perfomed by helper: Hook/unhook bra (pull down sports bra) Pull over shirt/dress - Perfomed by patient: Thread/unthread left sleeve, Put head through opening, Pull shirt over trunk, Thread/unthread right sleeve Pull over shirt/dress - Perfomed by helper: Thread/unthread right sleeve        Upper body assist Assist Level: Touching or steadying assistance(Pt > 75%)   Set up : To obtain clothing/put away  Lower Body Dressing/Undressing Lower body dressing   What is the patient wearing?: Pants, Non-skid slipper socks     Pants- Performed by patient: Thread/unthread right pants leg, Thread/unthread left pants leg Pants- Performed by helper: Pull pants up/down   Non-skid slipper socks- Performed by helper: Don/doff right sock, Don/doff left sock     Shoes - Performed by patient: Don/doff left shoe Shoes - Performed by helper: Don/doff right shoe  Lower body assist Assist for lower body dressing: Touching or steadying assistance (Pt > 75%)      Toileting Toileting Toileting activity did not occur: No continent bowel/bladder event Toileting steps completed by patient: Performs perineal hygiene Toileting steps completed by helper: Adjust clothing prior to toileting, Adjust clothing after toileting Toileting Assistive Devices: Grab bar or rail   Toileting assist Assist level: Touching or steadying assistance (Pt.75%)   Transfers Chair/bed transfer   Chair/bed transfer method: Ambulatory Chair/bed transfer assist level: Touching or steadying assistance (Pt > 75%) Chair/bed transfer assistive device: Armrests, Medical sales representative     Max distance: 40 feet Assist level: Touching or steadying assistance (Pt > 75%)   Wheelchair   Type: Manual Max wheelchair distance: 12 ft Assist Level: Moderate assistance (Pt 50 - 74%)  Cognition Comprehension Comprehension assist level: Understands basic 90% of the time/cues < 10% of the time  Expression Expression assist level: Expresses basic 90% of the time/requires cueing < 10% of the time.  Social Interaction Social Interaction assist level: Interacts appropriately 90% of the time - Needs monitoring or encouragement for participation or interaction.  Problem Solving Problem solving assist level: Solves basic 90% of the time/requires cueing < 10% of the time  Memory Memory assist level: Recognizes or recalls 90% of the time/requires cueing < 10% of the time    Medical Problem List and Plan: 1.  Decrease mobility, apraxia, decreased balance, aphasia, ataxia secondary to left CVA.  Cont CIR 2.  DVT Prophylaxis/Anticoagulation: Pharmaceutical: Other (comment)--Eliquis 3. Pain Management: tylenol prn. No issues with pain at prior admission. Continue pamelor at bedtime 4. Mood:  LCSW to follow for evaluation and support.  5. Neuropsych: This patient is not fully capable of making decisions on his own behalf. 6. Skin/Wound Care: routine pressure relief measures 7. Fluids/Electrolytes/Nutrition:  Monitor I/Os. Encourage fluid intake             Now on D3 nectar liquids.    8. HTN: Avoid ACE/ARB with history of CKD as well as issues with oral intake. Monitor BP-- bid.              Continue metoprolol bid.             Norvasc 5 started on 7/1  Improving, cont to monitor 9.  Mild leucocytosis: Resolved 10. Dysphagia:              IVF d/ced, encouraged oral fluids.   Cont to monitor 11. Dyslipidemia: On statin.  12. CKD              Cr 1.07 on 7/2               Cont to monitor 13. Hypoalbuminemia             Supplement initiated 6/29 14. ABLA             Hb 9.9 on 7/2             Cont to monitor 15. Hyperglycemia  Relatively controlled 7/2 16. Hypokalemia  K+2.8 on 7/2  Supplementing  Cont to monitor  LOS (Days) 4 A FACE TO FACE EVALUATION WAS PERFORMED  Ankit Lorie Phenix 01/18/2017 8:31 AM

## 2017-01-18 NOTE — Progress Notes (Signed)
Received in report that patient face was red, upon assessment, her face was still red so benadryl was given twice and was effective. Passed it on in report. We continue to monitor.

## 2017-01-18 NOTE — Progress Notes (Addendum)
Therapist passing pt room and pt sitting on the floor on the right side of the floor. Pt reports that was sitting on side of bed and slid to the floor, reports that didn't hit head, no other injuries noted. Pt assisted to recliner with safety belt on.  No bed alarm sounding at time of fall.  Family had just left  room. Reported to PA, charge nurse, and supv.

## 2017-01-18 NOTE — Care Management Note (Signed)
Inpatient [facility] Individual Statement of Services  Patient Name:  Valerie Mcclure  Date:  01/18/2017  Welcome to the [facility].  Our goal is to provide you with an individualized program based on your diagnosis and situation, designed to meet your specific needs.  With this comprehensive rehabilitation program, you will be expected to participate in at least 3 hours of rehabilitation therapies Monday-Friday, with modified therapy programming on the weekends.  Your rehabilitation program will include the following services:  Physical Therapy (PT), Occupational Therapy (OT), Speech Therapy (ST), 24 hour per day rehabilitation nursing, Therapeutic Recreaction (TR), Neuropsychology, Case Management (Social Worker), Rehabilitation Medicine, Nutrition Services and Pharmacy Services  Weekly team conferences will be held on Wednesdays to discuss your progress.  Your Social Worker will talk with you frequently to get your input and to update you on team discussions.  Team conferences with you and your family in attendance may also be held.  Expected length of stay: 5-7 days  Overall anticipated outcome: min assist  Depending on your progress and recovery, your program may change. Your Social Worker will coordinate services and will keep you informed of any changes. Your Social Worker's name and contact numbers are listed  below.  The following services may also be recommended but are not provided by the [facility] will be made to provide these services after discharge if needed.  Arrangements include referral to agencies that provide these services.  Your insurance has been verified to be:  [facility] Medicare Your primary doctor is:  Billey Gosling  Pertinent information will be shared with your doctor and your insurance  company.  Social Worker:  [place], [place] or ([phone]   Information discussed with and copy given to patient by: Lennart Pall, 01/18/2017, 10:33 AM

## 2017-01-18 NOTE — Progress Notes (Signed)
Occupational Therapy Discharge Summary  Patient Details  Name: Valerie Mcclure MRN: [id number] Date of Birth: 07-08-1941  Patient has met 9 of 9 long term goals due to improved activity tolerance, improved balance, postural control, ability to compensate for deficits, functional use of  RIGHT upper and RIGHT lower extremity, improved awareness and improved coordination.  Patient to discharge at [facility] Assist level.  Patient's care partner is independent to provide the necessary physical and cognitive assistance at discharge.    Reasons goals not met: N/A  Recommendation:  Patient will benefit from ongoing skilled OT services in home health setting to continue to advance functional skills in the area of BADL and Reduce care partner burden.  Equipment: No equipment provided Pt has all from previous stay  Reasons for discharge: treatment goals met and discharge from hospital  Patient/family agrees with progress made and goals achieved: Yes  OT Discharge Precautions/Restrictions  Precautions Precautions: Fall Precaution Comments: R sided weakness, R inattention Pain Pain Assessment Pain Assessment: No/denies pain ADL ADL ADL Comments: see functional navigator Vision Baseline Vision/History: Wears glasses Wears Glasses: Reading only Patient Visual Report: No change from baseline Vision Assessment?: Vision impaired- to be further tested in functional context Additional Comments: Able to scan in all directions, Rt inattention to body and environment Perception  Perception: Impaired Inattention/Neglect: Does not attend to right visual field;Does not attend to right side of body Praxis Praxis: Impaired Praxis Impairment Details: Motor planning Cognition Overall Cognitive Status: Impaired/Different from baseline Arousal/Alertness: Awake/alert Orientation Level: Oriented to person;Oriented to place Attention: Sustained Sustained Attention: Appears intact Selective  Attention: Appears intact Alternating Attention: Impaired Memory:  (difficult to assess d/t expressive aphasia) Memory Impairment: Decreased recall of new information Decreased Short Term Memory: Verbal basic;Functional basic Awareness: Impaired Problem Solving: Impaired Sequencing: Impaired Sequencing Impairment: Functional basic Organizing: Impaired Organizing Impairment: Functional basic Behaviors: Poor frustration tolerance Safety/Judgment: Impaired Sensation Sensation Light Touch: Impaired by gross assessment Light Touch Impaired Details: Impaired RUE;Impaired RLE Stereognosis: Impaired Detail Stereognosis Impaired Details: Impaired RUE Hot/Cold: Impaired by gross assessment Proprioception: Impaired Detail Proprioception Impaired Details: Impaired RUE Motor  Motor Motor: Hemiplegia Motor - Skilled Clinical Observations: Rt hemiplegia Extremity/Trunk Assessment RUE Assessment RUE Assessment: Exceptions to [facility] RUE AROM (degrees) RUE Overall AROM Comments: shoulder flexion grossly 140 degrees, utilizes abduction and external rotation to obtain flexion RUE Strength RUE Overall Strength Comments: strength grossly 3/5  LUE Assessment LUE Assessment: Within Functional Limits   See Function Navigator for Current Functional Status.  Simonne Come 01/18/2017, 12:19 PM

## 2017-01-18 NOTE — Progress Notes (Signed)
Occupational Therapy Session Note  Patient Details  Name: Valerie Mcclure MRN: [id number] Date of Birth: 08/22/1940  Today's Date: 01/18/2017 OT Individual Time: 1100-1157 OT Individual Time Calculation (min): 57 min    Short Term Goals: Week 1:  OT Short Term Goal 1 (Week 1): LTG=SSTG  Skilled Therapeutic Interventions/Progress Updates:    Completed ADL retraining at overall min assist level.  Pt required min assist initially for sit > stand progressing to supervision depending on hand placement and height of surfaces.  Ambulated to room shower with quad cane with min assist.  Pt completed all bathing at sit > stand level with min assist when standing.  Pt reports having long handled sponge at home, which will increase ease of washing lower legs and back.  Required min cues for sequencing and problem solving when washing buttocks.  Dressing completed at sit > stand level with pt requiring increased time, no cues, for sequencing and problem solving with dressing tasks.  Attempted to utilize step stool as well as hemi-technique to increase success with donning socks, however still requiring backward chaining.  Grooming tasks completed in standing at sink with mod cues for visual scanning to locate items and problem solving to apply toothpaste.  Completed stand pivot transfer w/c <> recliner with supervision.  Left upright in recliner with all needs in reach.  Therapy Documentation Precautions:  Precautions Precautions: Fall Precaution Comments: R sided weakness, R inattention Restrictions Weight Bearing Restrictions: No Pain:  Pt with no c/o pain ADL: ADL ADL Comments: see functional navigator  See Function Navigator for Current Functional Status.   Therapy/Group: Individual Therapy  Simonne Come 01/18/2017, 12:11 PM

## 2017-01-18 NOTE — Progress Notes (Signed)
Physical Therapy Session Note  Patient Details  Name: Valerie Mcclure MRN: [id number] Date of Birth: [birth year]  Today's Date: 01/18/2017 PT Individual Time: 0820-0919 PT Individual Time Calculation (min): 59 min   Short Term Goals: Week 1:  PT Short Term Goal 1 (Week 1): STG=LTG  Skilled Therapeutic Interventions/Progress Updates:    Pt sitting in recliner upon arrival, agreeable to PT session. Propelling w/c with LEs X 100 ft X2, cues to avoid drifting Rt. Gait: ambulating 50 ft, 100 ft, 55 ft with SBQC and min assist. Cues for posture and sequence as needed. Transfers: cues for sequence with sitting for safety. Balance: standing head and trunk rotations Lt/Rt, head up/down, staggered stance static. Bed mobility: supine<>sitting performed with supervision on second attempt. Pt using LLE to assist with Rt LE when going sitting>supine. Following session, pt returned to room. Sitting in recliner with all needs in reach.   Therapy Documentation Precautions:  Precautions Precautions: Fall Precaution Comments: R sided weakness, R inattention Restrictions Weight Bearing Restrictions: No  Pain:  Denies pain.    See Function Navigator for Current Functional Status.   Therapy/Group: Individual Therapy  Linard Millers, PT 01/18/2017, 8:38 AM

## 2017-01-18 NOTE — Progress Notes (Signed)
Speech Language Pathology Discharge Summary  Patient Details  Name: Valerie Mcclure MRN: [id number] Date of Birth: [birth year]  Today's Date: 01/18/2017 SLP Individual Time: 1500-1530 SLP Individual Time Calculation (min): 30 min   Skilled Therapeutic Interventions:  Skilled treatment session focused on providing education to pt's daughter and husband. Both were given handout of dysphagia 3 diet and nectar thick liquids items and were able to return demonstration of thickening liquids. Education also provided on ways to facilitate language comprehension and expression. All questions answered to their satisfaction.      Patient has met 7 of 7 long term goals.  Patient to discharge at overall Mod level.    Clinical Impression/Discharge Summary:   Pt has met 7 of 7 LTGs and pt's family are able to provide Mod A ca, including dysphagia 3 diet with nectar thick liquids. HHST is recommended to further advance pt's diet.   Care Partner:  Caregiver Able to Provide Assistance: Yes  Type of Caregiver Assistance: Physical;Cognitive  Recommendation:  Home Health SLP;Outpatient SLP;24 hour supervision/assistance  Rationale for SLP Follow Up: Maximize functional communication;Reduce caregiver burden;Maximize cognitive function and independence;Maximize swallowing safety   Equipment: Thickener   Reasons for discharge: Treatment goals met   Patient/Family Agrees with Progress Made and Goals Achieved: Yes   Function:  Eating Eating   Modified Consistency Diet: Yes Eating Assist Level: Set up assist for   Eating Set Up Assist For: Opening containers       Cognition Comprehension Comprehension assist level: Understands basic 75 - 89% of the time/ requires cueing 10 - 24% of the time  Expression   Expression assist level: Expresses basic 75 - 89% of the time/requires cueing 10 - 24% of the time. Needs helper to occlude trach/needs to repeat words.  Social Interaction Social Interaction  assist level: Interacts appropriately 90% of the time - Needs monitoring or encouragement for participation or interaction.  Problem Solving Problem solving assist level: Solves basic 75 - 89% of the time/requires cueing 10 - 24% of the time  Memory Memory assist level: Recognizes or recalls 90% of the time/requires cueing < 10% of the time   Quinlin Conant 01/18/2017, 4:20 PM

## 2017-01-18 NOTE — Progress Notes (Addendum)
Physical Therapy Discharge Summary  Patient Details  Name: Valerie Mcclure MRN: [id number] Date of Birth: 01-Mar-1941  Today's Date: 01/18/2017 PT Individual Time: 1410-1455 PT Individual Time Calculation (min): 45 min    Patient has met 4 of 9 long term goals due to improved activity tolerance, improved balance, increased strength and ability to compensate for deficits.  Patient to discharge at an ambulatory level [place].   Patient's care partner is independent to provide the necessary physical and cognitive assistance at discharge.  Reasons goals not met: Pt did not progress to supervision level with transfers and dynamic balance as initially anticipated due to continued balance deficits. Pt also unable to navigate 4 steps due to weakness and balance deficits, however this does not affect her discharge since she does not have steps at home.   Recommendation:  Patient will benefit from ongoing skilled PT services in home health setting to continue to advance safe functional mobility, address ongoing impairments in strength, balance, gait, functional mobility, safety awarens, and minimize fall risk.  Equipment: No equipment provided  Reasons for discharge: treatment goals met  Patient/family agrees with progress made and goals achieved: Yes     Therapy Session Treatment Husband and daughter present for family training. Discussed with the family the importance of allowing the patient time to complete tasks rather than doing everything for the patient or trying to rush the patient. Emphasized the importance of communication between both the patient and caregiver during transfers. Discussed the importance of proper techniques with transfers, specifically helping lift the patient from her waist as needed rather than pulling on the patients arms. Session focused on practicing car transfers, bed transfers, transfers to the recliner, and bed mobility with education to family members on how  to perform. Therapist performed all transfers first, with explanation and then gave the family opportunity to practice. Pt ambulated 150 ft with min assist from the therapist to work on ambulation tolerance. PT explained to the family that it will be safer to limit walking in the household, and to perform stand pivot transfers from the w/c instead. PT explained that the patient will continue to work on ambulation, balance and strengthening exercises in her therapy she receives at home. Family questions addressed, no further questions stated at this time. Pt left sitting in w/c with call bell in reach and family in the room.   PT Discharge Precautions/Restrictions Precautions Precautions: Fall Precaution Comments: R sided weakness, R inattention Restrictions Weight Bearing Restrictions: No Pain Pain Assessment Pain Assessment: No/denies pain Vision/Perception  Vision - History Baseline Vision: No visual deficits Patient Visual Report: No change from baseline Vision - Assessment Eye Alignment: Within Functional Limits Additional Comments: Able to scan in all directions, Rt inattention to body and environment Perception Perception: Impaired Inattention/Neglect: Does not attend to right visual field;Does not attend to right side of body Praxis Praxis: Impaired Praxis Impairment Details: Motor planning  Cognition Overall Cognitive Status: Impaired/Different from baseline Arousal/Alertness: Awake/alert Orientation Level: Oriented to person;Oriented to place;Oriented to situation;Oriented to time Attention: Sustained Sustained Attention: Appears intact Selective Attention: Appears intact Alternating Attention: Impaired Memory: Impaired Memory Impairment: Decreased recall of new information Decreased Short Term Memory: Verbal basic;Functional basic Awareness: Impaired Problem Solving: Impaired Sequencing: Impaired Sequencing Impairment: Functional basic Organizing:  Impaired Organizing Impairment: Functional basic Behaviors: Poor frustration tolerance Safety/Judgment: Impaired Sensation Sensation Light Touch: Impaired by gross assessment Light Touch Impaired Details: Impaired RUE;Impaired RLE Stereognosis: Impaired Detail Stereognosis Impaired Details: Impaired RUE Hot/Cold: Impaired by gross  assessment Proprioception: Impaired Detail Proprioception Impaired Details: Impaired RUE Coordination Gross Motor Movements are Fluid and Coordinated: No Fine Motor Movements are Fluid and Coordinated: No Coordination and Movement Description: decreased gross coordination through Rt UE/LE.  Heel Shin Test: unable to complete with current Rt LE control Motor  Motor Motor: Hemiplegia Motor - Skilled Clinical Observations: Rt hemiplegia     Balance Balance Balance Assessed: Yes Static Sitting Balance Static Sitting - Balance Support: Feet supported Static Sitting - Level of Assistance: 6: Modified independent (Device/Increase time) Dynamic Sitting Balance Dynamic Sitting - Balance Support: Feet supported Dynamic Sitting - Level of Assistance: 5: Stand by assistance Static Standing Balance Static Standing - Balance Support: No upper extremity supported Static Standing - Level of Assistance: 4: Min assist Dynamic Standing Balance Dynamic Standing - Balance Support: No upper extremity supported Dynamic Standing - Level of Assistance: 4: Min assist Extremity Assessment  RUE Assessment RUE Assessment: Exceptions to [facility] RUE AROM (degrees) RUE Overall AROM Comments: shoulder flexion grossly 140 degrees, utilizes abduction and external rotation to obtain flexion RUE Strength RUE Overall Strength Comments: grossly decreased strength LUE Assessment LUE Assessment: Within Functional Limits RLE Assessment RLE Assessment: Exceptions to [facility] RLE Strength RLE Overall Strength: Deficits Right Hip Flexion: 3-/5 Right Knee Extension: 3/5 Right Ankle  Dorsiflexion: 3/5 Right Ankle Plantar Flexion: 3/5 LLE Assessment LLE Assessment: Within Functional Limits   See Function Navigator for Current Functional Status. Netta Corrigan, PT, DPT 01/18/2017, 15:16  Linard Millers, PT 01/18/2017, 12:41 PM

## 2017-01-18 NOTE — Progress Notes (Addendum)
Called micro, C-Diff results are processing. CDiff negative, precautions removed

## 2017-01-19 DIAGNOSIS — W19XXXA Unspecified fall, initial encounter: Secondary | ICD-10-CM

## 2017-01-19 LAB — BASIC METABOLIC PANEL
Anion gap: 7 (ref 5–15)
BUN: 16 mg/dL (ref 6–20)
CO2: 23 mmol/L (ref 22–32)
CREATININE: 1.17 mg/dL — AB (ref 0.44–1.00)
Calcium: 9.1 mg/dL (ref 8.9–10.3)
Chloride: 111 mmol/L (ref 101–111)
GFR calc Af Amer: 51 mL/min — ABNORMAL LOW (ref >60)
GFR, EST NON AFRICAN AMERICAN: 44 mL/min — AB (ref >60)
Glucose, Bld: 115 mg/dL — ABNORMAL HIGH (ref 65–99)
Potassium: 4.2 mmol/L (ref 3.5–5.1)
SODIUM: 141 mmol/L (ref 135–145)

## 2017-01-19 MED ORDER — POTASSIUM CHLORIDE ER 10 MEQ PO TBCR: 10.0000 meq | EXTENDED_RELEASE_TABLET | Freq: Every day | ORAL | 0 refills | Status: DC

## 2017-01-19 MED ORDER — ATORVASTATIN CALCIUM 10 MG PO TABS: 5.0000 mg | ORAL_TABLET | Freq: Every day | ORAL | 0 refills | Status: DC

## 2017-01-19 MED ORDER — AMLODIPINE BESYLATE 10 MG PO TABS: 10.0000 mg | ORAL_TABLET | Freq: Every day | ORAL | 0 refills | Status: DC

## 2017-01-19 MED ORDER — AMLODIPINE BESYLATE 10 MG PO TABS
10.0000 mg | ORAL_TABLET | Freq: Every day | ORAL | Status: DC
  Administered 2017-01-19: 10 mg via ORAL
  Filled 2017-01-19: qty 1

## 2017-01-19 MED ORDER — RESOURCE THICKENUP CLEAR PO POWD: ORAL | 1 refills | Status: DC

## 2017-01-19 MED ORDER — METOPROLOL TARTRATE 25 MG PO TABS: 25.0000 mg | ORAL_TABLET | Freq: Two times a day (BID) | ORAL | 0 refills | Status: DC

## 2017-01-19 MED ORDER — CALCIUM POLYCARBOPHIL 625 MG PO TABS: 1250.0000 mg | ORAL_TABLET | Freq: Every day | ORAL | 0 refills | Status: DC

## 2017-01-19 NOTE — Discharge Instructions (Signed)
Inpatient Rehab Discharge Instructions  Valerie Mcclure Discharge date and time: 01/19/17   Activities/Precautions/ Functional Status: Activity: no lifting, driving, or strenuous exercise for till cleared by MD Diet: cardiac diet Nectar thick liquids Wound Care: none needed   Functional status:  ___ No restrictions     ___ Walk up steps independently _X__ 24/7 supervision/assistance   ___ Walk up steps with assistance ___ Intermittent supervision/assistance  ___ Bathe/dress independently ___ Walk with walker     _X__ Bathe/dress with assistance ___ Walk Independently    ___ Shower independently _X__ Walk with assistance    ___ Shower with assistance _X__ No alcohol     ___ Return to work/school ________     COMMUNITY REFERRALS UPON DISCHARGE:    Home Health:   PT     OT     ST                     Agency:  [facility]      Phone: [phone]   Special Instructions: 1. Drink plenty of fluids during the day to avoid dehydration. Fluids have be thickened or use naturally thickened items such as V 8, old fashioned buttermilk or fruit nectars. 2. DO NOT TAKE ANY MEDICATIONS THAT IS NOT ON THE LIST. NO PAIN MEDICATIONS ANYMORE.  3. FAMILY HAS TO HELP WITH MEDICATIONS.    STROKE/TIA DISCHARGE INSTRUCTIONS SMOKING Cigarette smoking nearly doubles your risk of having a stroke & is the single most alterable risk factor  If you smoke or have smoked in the last 12 months, you are advised to quit smoking for your health.  Most of the excess cardiovascular risk related to smoking disappears within a year of stopping.  Ask you doctor about anti-smoking medications  [place] Quit Line: 1-800-QUIT NOW  Free Smoking Cessation Classes [phone]  CHOLESTEROL Know your levels; limit fat & cholesterol in your diet  Lipid Panel     Component Value Date/Time   CHOL 162 11/30/2016 0329   TRIG 175 (H) 11/30/2016 0329   HDL 42 11/30/2016 0329   CHOLHDL 3.9 11/30/2016 0329   VLDL 35  11/30/2016 0329   LDLCALC 85 11/30/2016 0329      Many patients benefit from treatment even if their cholesterol is at goal.  Goal: Total Cholesterol (CHOL) less than 160  Goal:  Triglycerides (TRIG) less than 150  Goal:  HDL greater than 40  Goal:  LDL (LDLCALC) less than 100   BLOOD PRESSURE American Stroke Association blood pressure target is less that 120/80 mm/Hg  Your discharge blood pressure is:  BP: (!) 161/68  Monitor your blood pressure  Limit your salt and alcohol intake  Many individuals will require more than one medication for high blood pressure  DIABETES (A1c is a blood sugar average for last 3 months) Goal HGBA1c is under 7% (HBGA1c is blood sugar average for last 3 months)  Diabetes: No known diagnosis of diabetes    Lab Results  Component Value Date   HGBA1C 5.8 (H) 11/30/2016     Your HGBA1c can be lowered with medications, healthy diet, and exercise.  Check your blood sugar as directed by your physician  Call your physician if you experience unexplained or low blood sugars.  PHYSICAL ACTIVITY/REHABILITATION Goal is 30 minutes at least 4 days per week  Activity: No driving, Therapies: see above Return to work: N/A  Activity decreases your risk of heart attack and stroke and makes your heart stronger.  It helps control your weight and blood pressure; helps you relax and can improve your mood.  Participate in a regular exercise program.  Talk with your doctor about the best form of exercise for you (dancing, walking, swimming, cycling).  DIET/WEIGHT Goal is to maintain a healthy weight  Your discharge diet is: DIET DYS 3 Room service appropriate? Yes; Fluid consistency: Nectar Thick  liquids Your height is:  5'3" Your current weight is: Weight: 70.9 kg (156 lb 4.8 oz) Your Body Mass Index (BMI) is:  27.7  Following the type of diet specifically designed for you will help prevent another stroke.  Your goal weight  is: 141 lbs  Your goal Body Mass  Index (BMI) is 19-24.  Healthy food habits can help reduce 3 risk factors for stroke:  High cholesterol, hypertension, and excess weight.  RESOURCES Stroke/Support Group:  Call [phone]   STROKE EDUCATION PROVIDED/REVIEWED AND GIVEN TO PATIENT Stroke warning signs and symptoms How to activate emergency medical system (call 911). Medications prescribed at discharge. Need for follow-up after discharge. Personal risk factors for stroke. Pneumonia vaccine given:  Flu vaccine given:  My questions have been answered, the writing is legible, and I understand these instructions.  I will adhere to these goals & educational materials that have been provided to me after my discharge from the hospital.     My questions have been answered and I understand these instructions. I will adhere to these goals and the provided educational materials after my discharge from the hospital.  Patient/Caregiver Signature _______________________________ Date __________  Clinician Signature _______________________________________ Date __________  Please bring this form and your medication list with you to all your follow-up doctor's appointments.

## 2017-01-19 NOTE — Discharge Summary (Signed)
Physician Discharge Summary  Patient ID: Valerie Mcclure MRN: [id number] DOB/AGE: 1941/04/14 76 y.o.  Admit date: 01/14/2017 Discharge date: 01/19/2017  Discharge Diagnoses:  Principal Problem:   Acute ischemic left MCA stroke [facility]) Active Problems:   Essential hypertension   Dysphagia, post-stroke   Hypokalemia   Acute blood loss anemia   Hyperglycemia   Fall   Discharged Condition: stable.   Significant Diagnostic Studies: n/A   Labs:  Basic Metabolic Panel: BMP Latest Ref Rng & Units 01/19/2017 01/18/2017 01/15/2017  Glucose 65 - 99 mg/dL 115(H) 130(H) 107(H)  BUN 6 - 20 mg/dL 16 11 15   Creatinine 0.44 - 1.00 mg/dL 1.17(H) 1.07(H) 1.17(H)  Sodium 135 - 145 mmol/L 141 141 143  Potassium 3.5 - 5.1 mmol/L 4.2 2.8(L) 3.5  Chloride 101 - 111 mmol/L 111 109 113(H)  CO2 22 - 32 mmol/L 23 24 24   Calcium 8.9 - 10.3 mg/dL 9.1 8.2(L) 8.3(L)    CBC:  Recent Labs Lab 01/15/17 0518 01/18/17 0425  WBC 5.4 8.4  NEUTROABS 3.4  --   HGB 8.9* 9.9*  HCT 27.9* 30.4*  MCV 91.2 88.4  PLT 237 222    CBG: No results for input(s): GLUCAP in the last 168 hours.  Brief HPI:   Valerie Mcclure a 76 y.o.femalewith history of chronic LBP, CKD, L-MCA infarct s/p revascularization with hemorrhagic transformation with resultant right sided weakness, aphasia and inattention and new diagnosis of AFib. She underwent CIR stay complicated by encephalopathy due to narcotics (discontiued) and was discharged to home 6/21 at min assist level. She was readmitted 01/11/17 with increase in aphasia and worsening of right sided weakness. She had not received Eliquis X 2 days at home and MS contin had been resumed. Daughters expressed concerns about safety at home with illicit drug use and heroin overdose by a family member recently. UDS positive for opiates. She was started on IV fluid for hydration and MRI brain done showing interval acute/early subacute infarct in left caudate extending to  periventricular and left medial thalamus, left anterior temporal lobe encephalomalacia, decrease in size of left basal ganglia subacute hematoma. TPA not administered due to recent ICH. Dr. [name] recommended continuing Eliquis and compliance with antithrombotic medications   Hospital Course: JULLIANA Mcclure was admitted to rehab 01/14/2017 for inpatient therapies to consist of PT, ST and OT at least three hours five days a week. Past admission physiatrist, therapy team and rehab RN have worked together to provide customized collaborative inpatient rehab.  Blood pressures were monitored on bid basis. Norvasc was added on 7/1 and titrated to 10 mg prior to discharge to help with better control. Follow up CBC showed H/H to be improving and leucocytosis has resolved. Protein supplement was added to help with hypoalbuminemia. She has been tolerating tolerating dysphagia diet and IVF were discontinued over the weekend and renal status has been stable on nectars. She was found to be hypokalemic and was supplemented with improvement in K+ levels. She was discharged on low dose Kdur and is to have lytes repeated in 5-7 days to monitor for stability as well as input on need to continue supplement. There have been no reports/indicaions  of pain with increase in activity and husband has been instructed not to resume any narcotics after discharge and adhere to current list of mediations. She has progressed to min assist level and will continue to receive follow up HHPT,HHOT and HHST by [facility] after discharge.    Rehab course: During patient's  stay in rehab weekly team conferences were held to monitor patient's progress, set goals and discuss barriers to discharge. At admission, patient required min to mod assist with basic self care tasks and min assist with mobility. She exhibited mild to moderate dysphagia as well as moderate expressive aphasia complicated by apraxia but was able to express basic needs with  gestures. She has had improvement in activity tolerance, balance, postural control, as well as ability to compensate for deficits. She is has had improvement in functional use RUE  and RLE as well as improved awareness. She continues to have apraxia affecting functional and basic tasks. She requires min assist to complete ADL tasks. She requires min assist with transfers and mobility due to balance deficits and impulsivity. . She is able to ambulate 150' with min assist and need cues for safety.  She is tolerating nectar thick liquids without signs/symptoms of aspiration. Husband has been educated on appropriate diet, current medications as well as assistance needed with all aspects of care and mobility.     Disposition: 01-Home or Self Care  Diet: dysphagia 3, nectar liquids.   Special Instructions: 1. Needs supervision at meals. Encourage fluid intake between meals.  2. Do not take any narcotics and take medications as per discharge instructions.  3. Needs 24 hours supervision.  4. Needs BMET/CBC repeated in 5-7 days.   Allergies as of 01/19/2017      Reactions   Shrimp [shellfish Allergy] Anaphylaxis, Rash   Break outs and swelling of the throat   Valsartan Other (See Comments)   Renal failure   Tandem Plus [fefum-fepo-fa-b Cmp-c-[id number]-cu] Nausea And Vomiting   Ivp Dye [iodinated Diagnostic Agents] Rash   itching   Penicillins Itching, Rash   Has patient had a PCN reaction causing immediate rash, facial/tongue/throat swelling, SOB or lightheadedness with hypotension: Yes Has patient had a PCN reaction causing severe rash involving mucus membranes or skin necrosis: No Has patient had a PCN reaction that required hospitalization: No Has patient had a PCN reaction occurring within the last 10 years: No If all of the above answers are "NO", then may proceed with Cephalosporin use.      Medication List    STOP taking these medications    stroke: mapping our early stages of recovery book  Misc   furosemide 20 MG tablet Commonly known as:  LASIX   morphine 15 MG 12 hr tablet Commonly known as:  MS CONTIN   potassium chloride SA 20 MEQ tablet Commonly known as:  K-DUR,KLOR-CON   simvastatin 10 MG tablet Commonly known as:  ZOCOR   valsartan 80 MG tablet Commonly known as:  DIOVAN     TAKE these medications   amLODipine 10 MG tablet Commonly known as:  NORVASC Take 1 tablet (10 mg total) by mouth daily. Start taking on:  01/20/2017   apixaban 5 MG Tabs tablet Commonly known as:  ELIQUIS Take 1 tablet (5 mg total) by mouth 2 (two) times daily.   atorvastatin 10 MG tablet Commonly known as:  LIPITOR Take 0.5 tablets (5 mg total) by mouth daily at 6 PM. Notes to patient:  Take this instead of simvastatin to avoid side effects.    CALCIUM 1200 PO Take 1 tablet by mouth daily.   FLUoxetine 20 MG capsule Commonly known as:  PROZAC Take 1 capsule (20 mg total) by mouth daily.   levothyroxine 88 MCG tablet Commonly known as:  SYNTHROID, LEVOTHROID Take 1 tablet (88 mcg total) by mouth daily.  metoprolol tartrate 25 MG tablet Commonly known as:  LOPRESSOR Take 1 tablet (25 mg total) by mouth 2 (two) times daily.   nortriptyline 10 MG capsule Commonly known as:  PAMELOR Take 2 capsules (20 mg total) by mouth at bedtime.   polycarbophil 625 MG tablet Commonly known as:  FIBERCON Take 2 tablets (1,250 mg total) by mouth daily. Start taking on:  01/20/2017   potassium chloride 10 MEQ tablet Commonly known as:  K-DUR Take 1 tablet (10 mEq total) by mouth daily.   RESOURCE THICKENUP CLEAR Powd Use as needed to get liquid to nectar thick consistency   vitamin B-12 100 MCG tablet Commonly known as:  CYANOCOBALAMIN Take 100 mcg by mouth daily.   Vitamin D3 1000 units Caps Take 1,000 Units by mouth daily.      Follow-up Information    Jamse Arn, MD Follow up.   Specialty:  Physical Medicine and Rehabilitation Why:  office will call you for  follow up appointment.  Contact information: [address] [address] [phone]        Garvin Fila, MD. Call in 1 day(s).   Specialties:  Neurology, Radiology Why:  for follow up appointment Contact information: [address] [place] [address] [phone]        [name], MD Follow up on 01/22/2017.   Specialty:  Internal Medicine Why:  @ 10:00 am (hospital follow up appt) Contact information: [place] [address] [phone]           Signed: Bary Leriche 01/19/2017, 5:42 PM

## 2017-01-19 NOTE — Progress Notes (Signed)
Pt. Got d/c instructions and prescriptions.Pt. Is ready to go home with husband.

## 2017-01-19 NOTE — Progress Notes (Signed)
Social Work  Discharge Note  The overall goal for the admission was met for:   Discharge location: Yes - home with family to resume 24/7 assistance  Length of Stay: Yes - 5 days  Discharge activity level: Yes -aupervision/min assist overall  Home/community participation: Yes  Services provided included: MD, RD, PT, OT, SLP, RN, TR, Pharmacy and SW  Financial Services: Other: [employer] Medicare  Follow-up services arranged: Home Health: PT, OT, ST via [facility] and Patient/Family has no preference for HH/DME agencies  Comments (or additional information):  Patient/Family verbalized understanding of follow-up arrangements: Yes  Individual responsible for coordination of the follow-up plan: pt/ spouse  Confirmed correct DME delivered: NA - received all needed DME during 1st CIR stay  [place], [place], [place]

## 2017-01-19 NOTE — Progress Notes (Signed)
[place] PHYSICAL MEDICINE & REHABILITATION     PROGRESS NOTE  Subjective/Complaints:  Pt seen laying in bed this AM.  She slept well overnight. She had a fall yesterday afternoon.      ROS: Denies CP, SOB, N/V/D.  Objective: Vital Signs: Blood pressure (!) 149/53, pulse 62, temperature 97.9 F (36.6 C), temperature source Oral, resp. rate 18, weight 70.9 kg (156 lb 4.8 oz), SpO2 100 %. No results found.  Recent Labs  01/18/17 0425  WBC 8.4  HGB 9.9*  HCT 30.4*  PLT 222    Recent Labs  01/18/17 0425  NA 141  K 2.8*  CL 109  GLUCOSE 130*  BUN 11  CREATININE 1.07*  CALCIUM 8.2*   CBG (last 3)  No results for input(s): GLUCAP in the last 72 hours.  Wt Readings from Last 3 Encounters:  01/19/17 70.9 kg (156 lb 4.8 oz)  01/13/17 79.8 kg (176 lb)  01/07/17 69.3 kg (152 lb 11.2 oz)    Physical Exam:  BP (!) 149/53 (BP Location: Left Arm)   Pulse 62   Temp 97.9 F (36.6 C) (Oral)   Resp 18   Wt 70.9 kg (156 lb 4.8 oz)   SpO2 100%   BMI 27.69 kg/m  Head: Normocephalic and atraumatic.  Eyes: EOMI. No discharge.  Cardiovascular: RRR.  No JVD. Respiratory: Effort normal and breath sounds normal.  GI: Soft. Bowel sounds are normal.   Musculoskeletal: She exhibits no edema or tenderness.  Neurological: She is alert and oriented.  Right facial weakness.  Predominantly expressive aphasia, improved Motor: RUE/RLE 4+/5 with apraxia (unchanged) Skin: Skin is warm and dry. She is not diaphoretic. Psychiatric: She has a normal mood and affect. Her behavior is normal.     Assessment/Plan: 1. Functional deficits secondary to left CVA which require 3+ hours per day of interdisciplinary therapy in a comprehensive inpatient rehab setting. Physiatrist is providing close team supervision and 24 hour management of active medical problems listed below. Physiatrist and rehab team continue to assess barriers to discharge/monitor patient progress toward functional and medical  goals.  Function:  Bathing Bathing position   Position: Shower  Bathing parts Body parts bathed by patient: Right arm, Left arm, Chest, Abdomen, Front perineal area, Right upper leg, Left upper leg, Right lower leg, Left lower leg Body parts bathed by helper: Buttocks  Bathing assist Assist Level: Touching or steadying assistance(Pt > 75%)      Upper Body Dressing/Undressing Upper body dressing Upper body dressing/undressing activity did not occur: N/A What is the patient wearing?: Bra, Pull over shirt/dress Bra - Perfomed by patient: Thread/unthread right bra strap, Thread/unthread left bra strap Bra - Perfomed by helper: Hook/unhook bra (pull down sports bra) Pull over shirt/dress - Perfomed by patient: Thread/unthread left sleeve, Put head through opening, Pull shirt over trunk, Thread/unthread right sleeve Pull over shirt/dress - Perfomed by helper: Thread/unthread right sleeve        Upper body assist Assist Level: Touching or steadying assistance(Pt > 75%)   Set up : To obtain clothing/put away  Lower Body Dressing/Undressing Lower body dressing   What is the patient wearing?: Pants, Socks, Shoes Underwear - Performed by patient: Thread/unthread right underwear leg, Thread/unthread left underwear leg, Pull underwear up/down   Pants- Performed by patient: Thread/unthread right pants leg, Thread/unthread left pants leg, Pull pants up/down Pants- Performed by helper: Pull pants up/down   Non-skid slipper socks- Performed by helper: Don/doff right sock, Don/doff left sock   Socks -  Performed by helper: Don/doff right sock, Don/doff left sock Shoes - Performed by patient: Don/doff right shoe, Don/doff left shoe Shoes - Performed by helper: Don/doff right shoe          Lower body assist Assist for lower body dressing: Touching or steadying assistance (Pt > 75%)      Toileting Toileting Toileting activity did not occur: No continent bowel/bladder event Toileting steps  completed by patient: Adjust clothing prior to toileting, Adjust clothing after toileting Toileting steps completed by helper: Adjust clothing prior to toileting, Performs perineal hygiene, Adjust clothing after toileting Toileting Assistive Devices: Grab bar or rail  Toileting assist Assist level: Touching or steadying assistance (Pt.75%)   Transfers Chair/bed transfer   Chair/bed transfer method: Ambulatory Chair/bed transfer assist level: Touching or steadying assistance (Pt > 75%) Chair/bed transfer assistive device: Armrests, Librarian, academic     Max distance: 150 Assist level: Touching or steadying assistance (Pt > 75%)   Wheelchair   Type: Manual Max wheelchair distance: 100 ft Assist Level: Supervision or verbal cues  Cognition Comprehension Comprehension assist level: Understands basic 75 - 89% of the time/ requires cueing 10 - 24% of the time  Expression Expression assist level: Expresses basic 75 - 89% of the time/requires cueing 10 - 24% of the time. Needs helper to occlude trach/needs to repeat words.  Social Interaction Social Interaction assist level: Interacts appropriately 75 - 89% of the time - Needs redirection for appropriate language or to initiate interaction.  Problem Solving Problem solving assist level: Solves basic 75 - 89% of the time/requires cueing 10 - 24% of the time  Memory Memory assist level: Recognizes or recalls 75 - 89% of the time/requires cueing 10 - 24% of the time    Medical Problem List and Plan: 1.  Decrease mobility, apraxia, decreased balance, aphasia, ataxia secondary to left CVA.  D/c today  Will see patient in 1-2 weeks for transitional care management  Fall yesterday, no reported head trauma.  Pt denies symptoms.  Neurochecks WNL. 2.  DVT Prophylaxis/Anticoagulation: Pharmaceutical: Other (comment)--Eliquis 3. Pain Management: tylenol prn. No issues with pain at prior admission. Continue pamelor at bedtime 4. Mood:   LCSW to follow for evaluation and support.  5. Neuropsych: This patient is not fully capable of making decisions on his own behalf. 6. Skin/Wound Care: routine pressure relief measures 7. Fluids/Electrolytes/Nutrition:  Monitor I/Os. Encourage fluid intake             Now on D3 nectar liquids.    8. HTN: Avoid ACE/ARB with history of CKD as well as issues with oral intake. Monitor BP-- bid.              Continue metoprolol bid.             Norvasc 5 started on 7/1, increased to 10 on 7/3  Improving, cont to monitor 9. Mild leucocytosis: Resolved 10. Dysphagia:              IVF d/ced, encouraged oral fluids.   Cont to monitor 11. Dyslipidemia: On statin.  12. CKD              Cr 1.07 on 7/2               Cont to monitor 13. Hypoalbuminemia             Supplement initiated 6/29 14. ABLA             Hb  9.9 on 7/2             Cont to monitor 15. Hyperglycemia  Relatively controlled 7/3 16. Hypokalemia  K+2.8 on 7/2  Will order labs to ensure improvement prior to d/c  Supplementing  Cont to monitor  LOS (Days) 5 A FACE TO FACE EVALUATION WAS PERFORMED  Jalil Lorusso Lorie Phenix 01/19/2017 7:50 AM

## 2017-01-20 NOTE — Patient Care Conference (Signed)
Inpatient RehabilitationTeam Conference and Plan of Care Update Date: 01/19/2017   Time: 10:12 AM    Patient Name: Valerie Mcclure      Medical Record Number: [id number]  Date of Birth: 04-Oct-1940 Sex: Female         Room/Bed: 4M04C/4M04C-01 Payor Info: Payor: [employer] MEDICARE / Plan: [facility] MEDICARE / Product Type: *No Product type* /    Admitting Diagnosis: CVA  Admit Date/Time:  01/14/2017  6:54 PM Admission Comments: No comment available   Primary Diagnosis:  Acute ischemic left MCA stroke ([name]) Principal Problem: Acute ischemic left MCA stroke [facility])  Patient Active Problem List   Diagnosis Date Noted  . Fall   . Hyperglycemia   . Physical deconditioning   . Aphasia as late effect of cerebrovascular accident (CVA)   . AKI (acute kidney injury) ([name])   . PAF (paroxysmal atrial fibrillation) ([name])   . Acute kidney failure ([name]) 01/11/2017  . Chronic diastolic heart failure ([name])   . Dysarthria, post-stroke   . Hypoalbuminemia due to protein-calorie malnutrition ([name])   . Global aphasia   . Encephalopathy 12/23/2016  . Acute on chronic diastolic heart failure ([name])   . Acute blood loss anemia   . Tachypnea   . Toxic encephalopathy 12/22/2016  . Atrial flutter ([name]) 12/22/2016  . Hypokalemia 12/21/2016  . Obesity (BMI 30-39.9) 12/21/2016  . Narcotic overdose 12/19/2016  . Spastic hemiplegia of right dominant side as late effect of cerebral infarction ([name])   . E-coli UTI   . Anemia of chronic disease   . Benign essential HTN   . Dysphagia, post-stroke   . Non-fluent aphasia   . Anterior cerebral circulation hemorrhagic infarction ([name]) 12/03/2016  . PAC (premature atrial contraction) 12/03/2016  . Acute ischemic left MCA stroke ([name]) 12/03/2016  . Aphasia as late effect of stroke 12/03/2016  . Right hemiparesis ([name])   . Nontraumatic subcortical hemorrhage of left cerebral hemisphere ([name])   . Acute embolic stroke ([name]) 11/29/2016  . Mild aortic  regurgitation 08/20/2016  . Bilateral edema of lower extremity 04/07/2016  . Pancreatic duct dilated 01/09/2016  . CKD (chronic kidney disease) 12/25/2015  . Mild tricuspid regurgitation 12/25/2015  . Mild mitral regurgitation 12/25/2015  . Prediabetes 06/10/2015  . Edema 06/04/2015  . Abdominal pain, chronic, right upper quadrant 06/04/2015  . Postmenopausal disorder 04/03/2014  . Chronic low back pain   . PAIN IN THORACIC SPINE 04/18/2010  . Coronary atherosclerosis 01/24/2009  . DEGENERATIVE JOINT DISEASE 01/23/2009  . ARTHRITIS, LEFT KNEE 12/13/2008  . HERNIATED LUMBAR DISC 12/13/2008  . Hypothyroidism 11/21/2007  . Hyperlipidemia 11/09/2007  . ANEMIA 11/09/2007  . Essential hypertension 11/09/2007  . Osteopenia 11/09/2007    Expected Discharge Date: Expected Discharge Date: 01/19/17  Team Members Present: Physician leading conference: Dr. Maryla Morrow Social Worker Present: Amada Jupiter, LCSW Nurse Present: Tennis Must, RN PT Present: Other (comment) Midge Minium, PT) OT Present: Rosalio Loud, OT SLP Present: Feliberto Gottron, SLP PPS Coordinator present : Tora Duck, RN, CRRN     Current Status/Progress Goal Weekly Team Focus  Medical   Decrease mobility, apraxia, decreased balance, aphasia, secondary to left CVA  Improve dysphasia, HTN, CKD, cognition  See above   Bowel/Bladder   Continent to B&B. LBM 7/1.  Remain continent to B&B with min. assisst.  To monitor B&B function Q shift.   Swallow/Nutrition/ Hydration   Dys. 3 textures with nectar-thick liquids, Min A  Min A  Family Education complete and will d/c home  ADL's   min assist with and without AD  Min assist  d/c planning   Mobility   min assist ambulation, close supervision transfers to occasional min assist.   min assist - supervision  progress gait, balance, transfers, d/c planning, family edu.    Communication   Mod A  Mod A   Family Education complete and patient will d/c home with family     Safety/Cognition/ Behavioral Observations            Pain   No c/o of pain  Keep pain level less than 3  Assess for pain levels Q 2-3 hrs.   Skin   Skin dry and intact.MASD is better.  Continue free of pressure ulcers  To monitor skin Q shift and PRN    Rehab Goals Patient on target to meet rehab goals: Yes *See Care Plan and progress notes for long and short-term goals.  Barriers to Discharge: Mobiity, dysphagia, CKD, HTN, aphasia    Possible Resolutions to Barriers:  Therapies, follow BP, CKD stable, making progress, plan for d/c today    Discharge Planning/Teaching Needs:  Pt to d/c home with family providing 24/7 assistance.  Teaching completed yesterday.   Team Discussion:  Pt has met min assist goals and family trained on nectar liquids.  Reacy for d/c today.  Revisions to Treatment Plan:  None   Continued Need for Acute Rehabilitation Level of Care: The patient requires daily medical management by a physician with specialized training in physical medicine and rehabilitation for the following conditions: Daily direction of a multidisciplinary physical rehabilitation program to ensure safe treatment while eliciting the highest outcome that is of practical value to the patient.: Yes Daily medical management of patient stability for increased activity during participation in an intensive rehabilitation regime.: Yes Daily analysis of laboratory values and/or radiology reports with any subsequent need for medication adjustment of medical intervention for : Neurological problems;Blood pressure problems;Renal problems;Other  Stepanie Graver 01/20/2017, 10:12 AM

## 2017-01-21 ENCOUNTER — Other Ambulatory Visit: Payer: Self-pay | Admitting: *Deleted

## 2017-01-21 ENCOUNTER — Telehealth: Payer: Self-pay | Admitting: *Deleted

## 2017-01-21 NOTE — Patient Outreach (Signed)
[facility]) Care Management  01/21/2017  JAHNAY LANTIER [birth year] [id number]  Transition of Care Referral  Referral Date: 01/19/17 Referral Source: [facility] High Risk Date of Discharge: 01/19/17 Facility: [facility] Discharge Diagnosis: MCA Stroke Insurance: [facility] Medicare  Outreach attempt # 1 spoke with patient's spouse Aniqua Briere). HIPAA verified with spouse.   Social: Patient lives with husband. Patient is unable to communicate effectively, per spouse. Patient uses a wheelchair or cane. Per Epic Documentation, [facility] will be providing home services for patient (PT/OT/ST). Spouse reported, the patient hasn't been contacted by any home health service since being discharged from the hospital. Patient is dependent upon husband for transportation to medical appointments.  Conditions: Past Medical Conditions: CVA,, HTN, Dysphagia, CKD, LBP, Afib, Aphasia Spouse reported, patient is unable to communicate effectively related to deficits from a stroke. Patient had 5 hospital admissions within the last 6 months. Her spouse stated, she is dependent/assist with ADLs. Per husband, patient has chronic back and leg pain. She has been active with a pain management clinic for the last 15 years. During her last hospital admission/discharge, the MD stopped her "Morphine", per spouse. The MD note states that patient stopped taking her Eliquis for 2 days prior to being readmitted. Patient had a fall within the last 3 months related to the stroke. PT note explains functional deficits patient has related to CVA.  Medications: Spouse reported patient taking greater than 10 medications per day. Spouse verbalized, patient has difficulty affording medications.  Appointments: Patient has an upcoming appointment with PCP on 01/22/17.  Consent: [facility] services reviewed and discussed with spouse. She agreed to services.   Plan: RN CM will send [place] for medication  management.RN CM will send [facility] SW referral for possible assistance with community resources. RN CM advised patient that [facility] community RN CM would follow up and contact patient within the next 10 days RN CM advised patient to contact RN CM for any needs or concerns. RN CM provided patient with [facility] 24hr Nurse Line contact info.  [name], RN, BSN, MHA/MSL, [place] Telephonic Care Manager Coordinator [facility] Direct Phone: [phone] Toll Free: [phone] Fax: [phone]

## 2017-01-21 NOTE — Progress Notes (Deleted)
Subjective:    Patient ID: Valerie Mcclure, female    DOB: [birth year], 76 y.o.   MRN: [id number]  HPI The patient is here for follow up from the hospital.  She was here 6/25 for hospital follow up and her family noted that her neurological status was worse.  We did a stat Ct scan of her head which showed no bleeding, but her mental status and speech continued to worsen.  They took her to the ED where she had an MRI.   Admitted 01/11/17 - 01/14/17.    Recommendations for Outpatient Follow-up:  Please follow BMET in 2-3 days to follow electrolytes and renal function  Follow BP and adjust medications as needed Repeat CBC in 5 days to follow Hgb trend   The MRI showed an acute/subacute infarct of the left caudate body extending in the periventricular white matter and within left medial thalamus (likely extension of recent stroke from left MCA).  She was discharged to rehab for two weeks.  She was continued on the dysphagia 3 diet and nectar thick liquids.   Her other medical problems remained stable.   No medication changes were made.           Medications and allergies reviewed with patient and updated if appropriate.  Patient Active Problem List   Diagnosis Date Noted  . Fall   . Hyperglycemia   . Physical deconditioning   . Aphasia as late effect of cerebrovascular accident (CVA)   . AKI (acute kidney injury) ([place])   . PAF (paroxysmal atrial fibrillation) ([place])   . Acute kidney failure ([place]) 01/11/2017  . Chronic diastolic heart failure ([place])   . Dysarthria, post-stroke   . Hypoalbuminemia due to protein-calorie malnutrition ([place])   . Global aphasia   . Encephalopathy 12/23/2016  . Acute on chronic diastolic heart failure ([place])   . Acute blood loss anemia   . Tachypnea   . Toxic encephalopathy 12/22/2016  . Atrial flutter ([place]) 12/22/2016  . Hypokalemia 12/21/2016  . Obesity (BMI 30-39.9) 12/21/2016  . Narcotic overdose 12/19/2016  . Spastic hemiplegia of  right dominant side as late effect of cerebral infarction ([place])   . E-coli UTI   . Anemia of chronic disease   . Benign essential HTN   . Dysphagia, post-stroke   . Non-fluent aphasia   . Anterior cerebral circulation hemorrhagic infarction ([place]) 12/03/2016  . PAC (premature atrial contraction) 12/03/2016  . Acute ischemic left MCA stroke ([place]) 12/03/2016  . Aphasia as late effect of stroke 12/03/2016  . Right hemiparesis ([place])   . Nontraumatic subcortical hemorrhage of left cerebral hemisphere ([place])   . Acute embolic stroke ([place]) 54/65/0354  . Mild aortic regurgitation 08/20/2016  . Bilateral edema of lower extremity 04/07/2016  . Pancreatic duct dilated 01/09/2016  . CKD (chronic kidney disease) 12/25/2015  . Mild tricuspid regurgitation 12/25/2015  . Mild mitral regurgitation 12/25/2015  . Prediabetes 06/10/2015  . Edema 06/04/2015  . Abdominal pain, chronic, right upper quadrant 06/04/2015  . Postmenopausal disorder 04/03/2014  . Chronic low back pain   . PAIN IN THORACIC SPINE 04/18/2010  . Coronary atherosclerosis 01/24/2009  . DEGENERATIVE JOINT DISEASE 01/23/2009  . ARTHRITIS, LEFT KNEE 12/13/2008  . HERNIATED LUMBAR DISC 12/13/2008  . Hypothyroidism 11/21/2007  . Hyperlipidemia 11/09/2007  . ANEMIA 11/09/2007  . Essential hypertension 11/09/2007  . Osteopenia 11/09/2007    Current Outpatient Prescriptions on File Prior to Visit  Medication Sig Dispense Refill  . amLODipine (NORVASC) 10 MG  tablet Take 1 tablet (10 mg total) by mouth daily. 30 tablet 0  . apixaban (ELIQUIS) 5 MG TABS tablet Take 1 tablet (5 mg total) by mouth 2 (two) times daily. 60 tablet 0  . atorvastatin (LIPITOR) 10 MG tablet Take 0.5 tablets (5 mg total) by mouth daily at 6 PM. 30 tablet 0  . Calcium Carbonate-Vit D-Min (CALCIUM 1200 PO) Take 1 tablet by mouth daily.     . Cholecalciferol (VITAMIN D3) 1000 UNITS CAPS Take 1,000 Units by mouth daily.     Marland Kitchen FLUoxetine (PROZAC) 20 MG capsule Take 1  capsule (20 mg total) by mouth daily. 30 capsule 11  . levothyroxine (SYNTHROID, LEVOTHROID) 88 MCG tablet Take 1 tablet (88 mcg total) by mouth daily. 90 tablet 1  . Maltodextrin-Xanthan Gum (RESOURCE THICKENUP CLEAR) POWD Use as needed to get liquid to nectar thick consistency 3 Can 1  . metoprolol tartrate (LOPRESSOR) 25 MG tablet Take 1 tablet (25 mg total) by mouth 2 (two) times daily. 60 tablet 0  . nortriptyline (PAMELOR) 10 MG capsule Take 2 capsules (20 mg total) by mouth at bedtime. 180 capsule 1  . polycarbophil (FIBERCON) 625 MG tablet Take 2 tablets (1,250 mg total) by mouth daily. 60 tablet 0  . potassium chloride (K-DUR) 10 MEQ tablet Take 1 tablet (10 mEq total) by mouth daily. 15 tablet 0  . vitamin B-12 (CYANOCOBALAMIN) 100 MCG tablet Take 100 mcg by mouth daily.     No current facility-administered medications on file prior to visit.     Past Medical History:  Diagnosis Date  . Blood transfusion without reported diagnosis   . Chronic low back pain   . Hyperlipidemia   . Osteopenia   . Unspecified essential hypertension   . Unspecified hypothyroidism     Past Surgical History:  Procedure Laterality Date  . ABDOMINAL HYSTERECTOMY  1970  . CHOLECYSTECTOMY  07/2009   Dr. Rise Patience  . COLONOSCOPY  2003  . FLEXIBLE SIGMOIDOSCOPY  2010  . HAND SURGERY    . IR ANGIO INTRA EXTRACRAN SEL COM CAROTID INNOMINATE UNI L MOD SED  11/29/2016  . IR ANGIO VERTEBRAL SEL SUBCLAVIAN INNOMINATE UNI R MOD SED  11/29/2016  . IR PERCUTANEOUS ART THROMBECTOMY/INFUSION INTRACRANIAL INC DIAG ANGIO  11/29/2016  . LUMBAR LAMINECTOMY  11/2008   Done by Dr. Patrice Paradise  . RADIOLOGY WITH ANESTHESIA N/A 11/29/2016   Procedure: RADIOLOGY WITH ANESTHESIA;  Surgeon: Radiologist, Medication, MD;  Location: [facility];  Service: Radiology;  Laterality: N/A;  . THYROIDECTOMY      Social History   Social History  . Marital status: Married    Spouse name: N/A  . Number of children: N/A  . Years of education:  N/A   Social History Main Topics  . Smoking status: Never Smoker  . Smokeless tobacco: Never Used  . Alcohol use No  . Drug use: No  . Sexual activity: Not Currently   Other Topics Concern  . Not on file   Social History Narrative   Married    Family History  Problem Relation Age of Onset  . Heart disease Father 55       MI age 65s  . Lung cancer Brother 27  . Colon cancer Neg Hx   . Esophageal cancer Neg Hx   . Rectal cancer Neg Hx   . Stomach cancer Neg Hx     Review of Systems     Objective:  There were no vitals filed for this visit.  Wt Readings from Last 3 Encounters:  01/19/17 156 lb 4.8 oz (70.9 kg)  01/13/17 176 lb (79.8 kg)  01/07/17 152 lb 11.2 oz (69.3 kg)   There is no height or weight on file to calculate BMI.   Physical Exam    Constitutional: Appears well-developed and well-nourished. No distress.  HENT:  Head: Normocephalic and atraumatic.  Neck: Neck supple. No tracheal deviation present. No thyromegaly present.  No cervical lymphadenopathy Cardiovascular: Normal rate, regular rhythm and normal heart sounds.   No murmur heard. No carotid bruit .  No edema Pulmonary/Chest: Effort normal and breath sounds normal. No respiratory distress. No has no wheezes. No rales.  Skin: Skin is warm and dry. Not diaphoretic.  Psychiatric: Normal mood and affect. Behavior is normal.   MRI head 01/12/17: IMPRESSION: 1. Reduced diffusion within left caudate body extending in the periventricular white matter and within the left medial thalamus increased in distribution in comparison with prior MRI of the brain. Findings are consistent with interval acute/early subacute infarction. 2. Decreased size of late subacute hematoma within the left basal ganglia. Left lateral ventricle hemosiderin staining. 3. Left anterior temporal lobe encephalomalacia from prior infarction. 4. Patent circle of Willis. No large vessel occlusion, aneurysm, or significant stenosis is  identified.   CT head w/o contrast  01/11/17 IMPRESSION: Evolutionary changes and the area of hemorrhage in the left basal ganglia and prior infarcts seen in the anterior left temporal lobe. No acute infarction or hemorrhage. .  Assessment & Plan:    See Problem List for Assessment and Plan of chronic medical problems.

## 2017-01-21 NOTE — Telephone Encounter (Signed)
Transition Care Management Follow-up Telephone Call   Date discharged? 01/19/17   How have you been since you were released from the hospital? Called pt she states she is doing ok   Do you understand why you were in the hospital? YES   Do you understand the discharge instructions? YES   Where were you discharged to? Home   Items Reviewed:  Medications reviewed: YES  Allergies reviewed: YES  Dietary changes reviewed: YES  Referrals reviewed: YES   Functional Questionnaire:   Activities of Daily Living (ADLs):   She states she are independent in the following: bathing and hygiene, feeding, continence, grooming, toileting and dressing States they require assistance with the following: ambulation    Any transportation issues/concerns?: NO   Any patient concerns? NO   Confirmed importance and date/time of follow-up visits scheduled YES, appt 01/22/17  Provider Appointment booked with Dr. Quay Burow  Confirmed with patient if condition begins to worsen call PCP or go to the ER.  Patient was given the office number and encouraged to call back with question or concerns.  : YES

## 2017-01-22 ENCOUNTER — Telehealth: Payer: Self-pay | Admitting: Internal Medicine

## 2017-01-22 ENCOUNTER — Inpatient Hospital Stay: Payer: Medicare Other | Admitting: Internal Medicine

## 2017-01-22 NOTE — Telephone Encounter (Signed)
FYI: Home health will be going out on July 9th.

## 2017-01-24 DIAGNOSIS — I69320 Aphasia following cerebral infarction: Secondary | ICD-10-CM | POA: Diagnosis not present

## 2017-01-24 DIAGNOSIS — Z9181 History of falling: Secondary | ICD-10-CM | POA: Diagnosis not present

## 2017-01-24 DIAGNOSIS — I5030 Unspecified diastolic (congestive) heart failure: Secondary | ICD-10-CM | POA: Diagnosis not present

## 2017-01-24 DIAGNOSIS — I130 Hypertensive heart and chronic kidney disease with heart failure and stage 1 through stage 4 chronic kidney disease, or unspecified chronic kidney disease: Secondary | ICD-10-CM | POA: Diagnosis not present

## 2017-01-24 DIAGNOSIS — Z993 Dependence on wheelchair: Secondary | ICD-10-CM | POA: Diagnosis not present

## 2017-01-24 DIAGNOSIS — I4891 Unspecified atrial fibrillation: Secondary | ICD-10-CM | POA: Diagnosis not present

## 2017-01-24 DIAGNOSIS — I69351 Hemiplegia and hemiparesis following cerebral infarction affecting right dominant side: Secondary | ICD-10-CM | POA: Diagnosis not present

## 2017-01-24 DIAGNOSIS — N183 Chronic kidney disease, stage 3 (moderate): Secondary | ICD-10-CM | POA: Diagnosis not present

## 2017-01-25 ENCOUNTER — Other Ambulatory Visit: Payer: Self-pay | Admitting: Licensed Clinical Social Worker

## 2017-01-25 ENCOUNTER — Telehealth: Payer: Self-pay | Admitting: Internal Medicine

## 2017-01-25 ENCOUNTER — Ambulatory Visit: Payer: Self-pay | Admitting: Licensed Clinical Social Worker

## 2017-01-25 ENCOUNTER — Other Ambulatory Visit: Payer: Self-pay

## 2017-01-25 DIAGNOSIS — I69351 Hemiplegia and hemiparesis following cerebral infarction affecting right dominant side: Secondary | ICD-10-CM | POA: Diagnosis not present

## 2017-01-25 DIAGNOSIS — N183 Chronic kidney disease, stage 3 (moderate): Secondary | ICD-10-CM | POA: Diagnosis not present

## 2017-01-25 DIAGNOSIS — I130 Hypertensive heart and chronic kidney disease with heart failure and stage 1 through stage 4 chronic kidney disease, or unspecified chronic kidney disease: Secondary | ICD-10-CM | POA: Diagnosis not present

## 2017-01-25 DIAGNOSIS — I5030 Unspecified diastolic (congestive) heart failure: Secondary | ICD-10-CM | POA: Diagnosis not present

## 2017-01-25 DIAGNOSIS — Z993 Dependence on wheelchair: Secondary | ICD-10-CM | POA: Diagnosis not present

## 2017-01-25 DIAGNOSIS — Z9181 History of falling: Secondary | ICD-10-CM | POA: Diagnosis not present

## 2017-01-25 DIAGNOSIS — I69320 Aphasia following cerebral infarction: Secondary | ICD-10-CM | POA: Diagnosis not present

## 2017-01-25 DIAGNOSIS — I4891 Unspecified atrial fibrillation: Secondary | ICD-10-CM | POA: Diagnosis not present

## 2017-01-25 NOTE — Patient Outreach (Addendum)
[facility]) Care Management  01/25/2017  Valerie Mcclure 1940-10-30 [id number]  Assessment- CSW received new referral on patient and completed initial outreach today on 01/25/17. CSW was able to reach patient's spouse successfully. Patient herself is not able to communicate due to recent stroke. Spouse provided HIPPA verifications. CSW introduced self, reason for call and of [facility] social work services.  Spouse is extremely pleasant and is open to providing brief history on patient. Spouse reports that patient had a recent fell yesterday and "her appetite is very low." Spouse states "I wanted to get you involved so we could have her go to a short term nursing facility but I don't want to do that anymore." He shares "I'm never going to give up on my wife and I want her to know that. I want to give every option a shot before she has to go to short term rehab because I know she doesn't want to go. I rather give Home Health a chance first." Spouse reports that patient's balance is very poor. Currently, patient's daughter [name] is residing with family as her apartment (which is 1 block away) is getting worked on. Spouse reports that daughter [name] is a "huge help" but that patient's other daughter [name] is not involved. Spouse reports that [facility] will start services today. He reports that he hopes this this well help patient to become stronger. He shares that he has his own health issues and nerve damage which makes it difficult for him to assist patient at times. Spouse reports that he has no issues with transportation and that he provides stable transportation for patient to all medical appointments. When asked how they were managing finances he stated "We get by." However, spouse admits that they are not able to afford a private pay caregiver at this time. CSW advised spouse to look into hiring someone with care giving experience that is not attached to an agency which would be more  cost effective. CSW educated spouse on the [facility] [place] which is a free care giving service with a wait list of 1 year 1/2. Spouse wishes for CSW to place patient on that wait list. Spouse understands that program will contact him every 6 months to make sure they still wish for patient to be on the wait list. Spouse expressed understanding that he must respond to those future contacts. Spouse denies any other social work needs at this time as he no longer wishes for patient to be placed at short term rehab. Spouse also denies wanting patient to be placed at a facility long term. Spouse reports "We really need help with her medications." CSW informed him that [place] and Pharmacist will be in touch within the next 10 days. Spouse prefers [facility] involvement first to see if patient can improve any before seeking short term placement at a nursing facility. However, spouse is agreeable to CSW mailing out Senior resources, Pensions consultant, In [employer] information, Caregiver Support Group information, Private Pay Caregiver information and [place] and [employer]. CSW will also place patient on the wait list for In [place]. Spouse is agreeable to keeping CSW's number for any further questions or social work needs.  CSW completed call to In [facility] and was transferred to appropriate caseworker. CSW unable to reach [employer] caseworker and complete referral but left HIPPA compliant voice message requesting return call.  Plan-CSW will send request to [place] Management Assistant  to mail out requested resources. CSW will await to hear back from In [place] in order to make referral to program. CSW will update [facility] RNCM and Pharmacist.  Eula Fried, [place], MSW, [email or web address] Phone: [phone] Fax: [phone]

## 2017-01-25 NOTE — Telephone Encounter (Signed)
Spoke with Juliann Pulse to give verbal orders per MD.

## 2017-01-25 NOTE — Telephone Encounter (Signed)
Needs verbals for speech therapy for 2x6    Has 3 medication issues, 2 drug interactions and 1 adverse interaction , these have been faxed today to be looked over

## 2017-01-25 NOTE — Telephone Encounter (Signed)
Spoke with Flor to give verbal orders per MD.  

## 2017-01-25 NOTE — Telephone Encounter (Signed)
[facility]   [phone]  Needs verbal or Home health PT  2 week 1 3 week 3 2 week 2 1 week 1

## 2017-01-26 ENCOUNTER — Telehealth: Payer: Self-pay | Admitting: Internal Medicine

## 2017-01-26 ENCOUNTER — Other Ambulatory Visit: Payer: Self-pay

## 2017-01-26 NOTE — Telephone Encounter (Signed)
Cindy from [facility] called stating that the Case Manager has requested for social work to go out and see the pt due to her [place] being greater than 9.

## 2017-01-26 NOTE — Telephone Encounter (Signed)
LVM with Jenny Reichmann from [facility] to giver verbals for Social Work eval per MD.

## 2017-01-27 ENCOUNTER — Other Ambulatory Visit: Payer: Self-pay | Admitting: Licensed Clinical Social Worker

## 2017-01-27 DIAGNOSIS — Z9181 History of falling: Secondary | ICD-10-CM | POA: Diagnosis not present

## 2017-01-27 DIAGNOSIS — I4891 Unspecified atrial fibrillation: Secondary | ICD-10-CM | POA: Diagnosis not present

## 2017-01-27 DIAGNOSIS — I5030 Unspecified diastolic (congestive) heart failure: Secondary | ICD-10-CM | POA: Diagnosis not present

## 2017-01-27 DIAGNOSIS — Z993 Dependence on wheelchair: Secondary | ICD-10-CM | POA: Diagnosis not present

## 2017-01-27 DIAGNOSIS — I130 Hypertensive heart and chronic kidney disease with heart failure and stage 1 through stage 4 chronic kidney disease, or unspecified chronic kidney disease: Secondary | ICD-10-CM | POA: Diagnosis not present

## 2017-01-27 DIAGNOSIS — I69320 Aphasia following cerebral infarction: Secondary | ICD-10-CM | POA: Diagnosis not present

## 2017-01-27 DIAGNOSIS — I69351 Hemiplegia and hemiparesis following cerebral infarction affecting right dominant side: Secondary | ICD-10-CM | POA: Diagnosis not present

## 2017-01-27 DIAGNOSIS — N183 Chronic kidney disease, stage 3 (moderate): Secondary | ICD-10-CM | POA: Diagnosis not present

## 2017-01-27 NOTE — Patient Outreach (Signed)
[facility]) Care Management   01/26/2017  Valerie Mcclure 07-10-1941 [id number]  Valerie Mcclure is an 76 y.o. female  Subjective:  I had a stroke. I am getting physical therapy here at home.  Objective:   ROS  Frail lady with right side weakness, right side facial droop  Physical Exam  ROS  Encounter Medications:   Outpatient Encounter Prescriptions as of 01/26/2017  Medication Sig  . amLODipine (NORVASC) 10 MG tablet Take 1 tablet (10 mg total) by mouth daily.  Marland Kitchen apixaban (ELIQUIS) 5 MG TABS tablet Take 1 tablet (5 mg total) by mouth 2 (two) times daily.  Marland Kitchen atorvastatin (LIPITOR) 10 MG tablet Take 0.5 tablets (5 mg total) by mouth daily at 6 PM.  . Calcium Carbonate-Vit D-Min (CALCIUM 1200 PO) Take 1 tablet by mouth daily.   . Cholecalciferol (VITAMIN D3) 1000 UNITS CAPS Take 1,000 Units by mouth daily.   Marland Kitchen FLUoxetine (PROZAC) 20 MG capsule Take 1 capsule (20 mg total) by mouth daily.  Marland Kitchen levothyroxine (SYNTHROID, LEVOTHROID) 88 MCG tablet Take 1 tablet (88 mcg total) by mouth daily.  . Maltodextrin-Xanthan Gum (RESOURCE THICKENUP CLEAR) POWD Use as needed to get liquid to nectar thick consistency  . metoprolol tartrate (LOPRESSOR) 25 MG tablet Take 1 tablet (25 mg total) by mouth 2 (two) times daily.  . nortriptyline (PAMELOR) 10 MG capsule Take 2 capsules (20 mg total) by mouth at bedtime.  . polycarbophil (FIBERCON) 625 MG tablet Take 2 tablets (1,250 mg total) by mouth daily.  . potassium chloride (K-DUR) 10 MEQ tablet Take 1 tablet (10 mEq total) by mouth daily.  . vitamin B-12 (CYANOCOBALAMIN) 100 MCG tablet Take 100 mcg by mouth daily.   No facility-administered encounter medications on file as of 01/26/2017.     Functional Status:   In your present state of health, do you have any difficulty performing the following activities: 01/26/2017 01/15/2017  Hearing? N -  Vision? Y -  Difficulty concentrating or making decisions? N -  Walking or  climbing stairs? Y -  Dressing or bathing? Y -  Doing errands, shopping? [name]  Preparing Food and eating ? Y -  Using the Toilet? Y -  In the past six months, have you accidently leaked urine? Y -  Do you have problems with loss of bowel control? Y -  Managing your Medications? Y -  Managing your Finances? Y -  Housekeeping or managing your Housekeeping? Y -  Some recent data might be hidden    Fall/Depression Screening:    Fall Risk  01/26/2017 01/21/2017 04/07/2016  Falls in the past year? Yes Yes No  Number falls in past yr: 2 or more 1 -  Injury with Fall? No No -  Risk Factor Category  High Fall Risk - -  Risk for fall due to : History of fall(s);Impaired balance/gait;Impaired mobility;Impaired vision;Medication side effect History of fall(s);Impaired mobility;Mental status change -  Follow up Falls prevention discussed Falls evaluation completed -   PHQ 2/9 Scores 01/26/2017 01/21/2017 04/07/2016 04/03/2014 11/14/2012  PHQ - 2 Score 4 - 0 0 0  PHQ- 9 Score 10 - - - -  Exception Documentation - Other- indicate reason in comment box - - -  Not completed - unable to assess, spoke with husband - - -   Depression screen [facility] 2/9 01/26/2017 04/07/2016 04/03/2014 11/14/2012  Decreased Interest 3 0 0 0  Down, Depressed, Hopeless 1 0 0 0  PHQ - 2 Score  4 0 0 0  Altered sleeping 0 - - -  Tired, decreased energy 0 - - -  Change in appetite 3 - - -  Feeling bad or failure about yourself  0 - - -  Trouble concentrating 0 - - -  Moving slowly or fidgety/restless 3 - - -  Suicidal thoughts 0 - - -  PHQ-9 Score 10 - - -  Difficult doing work/chores Very difficult - - -   Fall Risk  01/26/2017 01/21/2017 04/07/2016 01/29/2016 04/03/2014  Falls in the past year? Yes Yes No No No  Number falls in past yr: 2 or more 1 - - -  Injury with Fall? No No - - -  Risk Factor Category  High Fall Risk - - - -  Risk for fall due to : History of fall(s);Impaired balance/gait;Impaired mobility;Impaired  vision;Medication side effect History of fall(s);Impaired mobility;Mental status change - - -  Follow up Falls prevention discussed Falls evaluation completed - - -   [facility] CM Care Plan Problem One     Most Recent Value  Care Plan Problem One  (P) recent life changing CVA  Role Documenting the Problem One  (P) Care Management [place] for Problem One  (P) Active  [facility] Long Term Goal   (P) patient will have no acute care admission in the next 31 days  [facility] Long Term Goal Start Date  (P) 01/26/17  Interventions for Problem One Long Term Goal  (P) initial home visit to assess case management needs    [facility] CM Care Plan Problem Two     Most Recent Value  Care Plan for Problem Two  (P) Active  Interventions for Problem Two Long Term Goal   (P) PHQ scale administered  [facility] Long Term Goal  (P) Patient has PHQ scale of over 9  [facility] Long Term Goal Start Date  (P) 01/26/17    [facility] CM Care Plan Problem Three     Most Recent Value  Care Plan Problem Three  (P) patient is at risk for fall  Role Documenting the Problem Three  (P) Care Management [place] for Problem Three  (P) Active  [facility] Long Term Goal   (P) In the next 31 days, patient will meet with [facility] RNCM for fall prevention education  [facility] Long Term Goal Start Date  (P) 01/26/17      Assessment:   Patient referred for community care coordination due to limited income, recent CVA with right side weakness, right facial droop, expressive aphasia. Patient lives in an apartment with her husband, Valerie Mcclure, who is her primary caregiver.Both retired from [employer].  Her daugther, Valerie Mcclure is very active in her care  Patient and her husband have a total income of over $3000, however, they indicate they are having a hard time making ends meet. Patient and husband report having very expensive copayments and medical bills. Patient scored a PHQ over 9, referral made to [facility] for LCSW consult.  Patient acknowledges feeling  depressed at times, especially with her CVA. Referral placed for [place] to assist with assessing medication cost, education.   Patient is at risk for fall, reviewed EMMI video and also viewed CVA (Stroke) Bundle  Plan:  Contact in the next 21 days for assessment of community care coordination needs.

## 2017-01-27 NOTE — Patient Outreach (Signed)
[facility]) Care Management  01/27/2017  AURILLA COULIBALY 1940/10/24 [id number]  Assessment- CSW received return call from [place] caseworker on 01/27/17. CSW was able to place patient on the wait list for the In [place]. Current wait is 1 1/2 years - 2 years. Service be free. CSW will now complete social work discharge now. Patient will also have a Education officer, museum with [place].   Plan-CSW will complete social work discharge.  Eula Fried, BSW, MSW, [email or web address] Phone: [phone] Fax: [phone]

## 2017-01-28 DIAGNOSIS — I130 Hypertensive heart and chronic kidney disease with heart failure and stage 1 through stage 4 chronic kidney disease, or unspecified chronic kidney disease: Secondary | ICD-10-CM | POA: Diagnosis not present

## 2017-01-28 DIAGNOSIS — Z9181 History of falling: Secondary | ICD-10-CM | POA: Diagnosis not present

## 2017-01-28 DIAGNOSIS — N183 Chronic kidney disease, stage 3 (moderate): Secondary | ICD-10-CM | POA: Diagnosis not present

## 2017-01-28 DIAGNOSIS — I69351 Hemiplegia and hemiparesis following cerebral infarction affecting right dominant side: Secondary | ICD-10-CM | POA: Diagnosis not present

## 2017-01-28 DIAGNOSIS — I69320 Aphasia following cerebral infarction: Secondary | ICD-10-CM | POA: Diagnosis not present

## 2017-01-28 DIAGNOSIS — Z993 Dependence on wheelchair: Secondary | ICD-10-CM | POA: Diagnosis not present

## 2017-01-28 DIAGNOSIS — I4891 Unspecified atrial fibrillation: Secondary | ICD-10-CM | POA: Diagnosis not present

## 2017-01-28 DIAGNOSIS — I5030 Unspecified diastolic (congestive) heart failure: Secondary | ICD-10-CM | POA: Diagnosis not present

## 2017-01-28 NOTE — Progress Notes (Signed)
Subjective:    Patient ID: Valerie Mcclure, female    DOB: [birth year], 76 y.o.   MRN: [id number]  HPI The patient is here for follow up from the hospital.  She was here 6/25 for hospital follow up and her family noted that her neurological status was worse.  We did a stat Ct scan of her head which showed no bleeding, but her mental status and speech continued to worsen.  They took her to the ED where she had an MRI.   Admitted 01/11/17 - 01/14/17.    Recommendations for Outpatient Follow-up:  Please follow BMET in 2-3 days to follow electrolytes and renal function  Follow BP and adjust medications as needed Repeat CBC in 5 days to follow Hgb trend   The MRI showed an acute/subacute infarct of the left caudate body extending in the periventricular white matter and within left medial thalamus (likely extension of recent stroke from left MCA).  She was discharged to rehab for two weeks.  She was continued on the dysphagia 3 diet and nectar thick liquids.   Her other medical problems remained stable.   No medication changes were made.  She was sent to rehabilitation for 2 weeks and did very well. She is currently at home. She is doing speech therapy-they have been out twice. Physical therapy has not called them yet to set up a time to come out. There are also advised the pharmacist with, and they have not recurred about that.  She is doing well and improving. She can eat by herself. She is not using any of the thickening liquid and has no difficulty eating or swallowing. She does need some assistance. There have not been any falls. Her weakness is improving. Her speech is improving.   Depression: She is taking the Prozac daily. She wonders if she can come off of it. She does not feel depressed or anxious mood rather not take it.   Medications and allergies reviewed with patient and updated if appropriate.  Patient Active Problem List   Diagnosis Date Noted  . Fall   .  Hyperglycemia   . Physical deconditioning   . Aphasia as late effect of cerebrovascular accident (CVA)   . AKI (acute kidney injury) ([place])   . PAF (paroxysmal atrial fibrillation) ([place])   . Acute kidney failure ([place]) 01/11/2017  . Chronic diastolic heart failure ([place])   . Dysarthria, post-stroke   . Hypoalbuminemia due to protein-calorie malnutrition ([place])   . Global aphasia   . Encephalopathy 12/23/2016  . Acute on chronic diastolic heart failure ([place])   . Acute blood loss anemia   . Tachypnea   . Toxic encephalopathy 12/22/2016  . Atrial flutter ([place]) 12/22/2016  . Hypokalemia 12/21/2016  . Obesity (BMI 30-39.9) 12/21/2016  . Narcotic overdose 12/19/2016  . Spastic hemiplegia of right dominant side as late effect of cerebral infarction ([place])   . E-coli UTI   . Anemia of chronic disease   . Benign essential HTN   . Dysphagia, post-stroke   . Non-fluent aphasia   . Anterior cerebral circulation hemorrhagic infarction ([place]) 12/03/2016  . PAC (premature atrial contraction) 12/03/2016  . Acute ischemic left MCA stroke ([place]) 12/03/2016  . Aphasia as late effect of stroke 12/03/2016  . Right hemiparesis ([place])   . Nontraumatic subcortical hemorrhage of left cerebral hemisphere ([place])   . Acute embolic stroke ([place]) 93/23/5573  . Mild aortic regurgitation 08/20/2016  . Bilateral edema of lower extremity 04/07/2016  .  Pancreatic duct dilated 01/09/2016  . CKD (chronic kidney disease) 12/25/2015  . Mild tricuspid regurgitation 12/25/2015  . Mild mitral regurgitation 12/25/2015  . Prediabetes 06/10/2015  . Edema 06/04/2015  . Abdominal pain, chronic, right upper quadrant 06/04/2015  . Postmenopausal disorder 04/03/2014  . Chronic low back pain   . PAIN IN THORACIC SPINE 04/18/2010  . Coronary atherosclerosis 01/24/2009  . DEGENERATIVE JOINT DISEASE 01/23/2009  . ARTHRITIS, LEFT KNEE 12/13/2008  . HERNIATED LUMBAR DISC 12/13/2008  . Hypothyroidism 11/21/2007  . Hyperlipidemia  11/09/2007  . ANEMIA 11/09/2007  . Essential hypertension 11/09/2007  . Osteopenia 11/09/2007    Current Outpatient Prescriptions on File Prior to Visit  Medication Sig Dispense Refill  . amLODipine (NORVASC) 10 MG tablet Take 1 tablet (10 mg total) by mouth daily. 30 tablet 0  . apixaban (ELIQUIS) 5 MG TABS tablet Take 1 tablet (5 mg total) by mouth 2 (two) times daily. 60 tablet 0  . atorvastatin (LIPITOR) 10 MG tablet Take 0.5 tablets (5 mg total) by mouth daily at 6 PM. 30 tablet 0  . Calcium Carbonate-Vit D-Min (CALCIUM 1200 PO) Take 1 tablet by mouth daily.     . Cholecalciferol (VITAMIN D3) 1000 UNITS CAPS Take 1,000 Units by mouth daily.     Marland Kitchen FLUoxetine (PROZAC) 20 MG capsule Take 1 capsule (20 mg total) by mouth daily. 30 capsule 11  . levothyroxine (SYNTHROID, LEVOTHROID) 88 MCG tablet Take 1 tablet (88 mcg total) by mouth daily. 90 tablet 1  . Maltodextrin-Xanthan Gum (RESOURCE THICKENUP CLEAR) POWD Use as needed to get liquid to nectar thick consistency 3 Can 1  . metoprolol tartrate (LOPRESSOR) 25 MG tablet Take 1 tablet (25 mg total) by mouth 2 (two) times daily. 60 tablet 0  . nortriptyline (PAMELOR) 10 MG capsule Take 2 capsules (20 mg total) by mouth at bedtime. 180 capsule 1  . polycarbophil (FIBERCON) 625 MG tablet Take 2 tablets (1,250 mg total) by mouth daily. 60 tablet 0  . potassium chloride (K-DUR) 10 MEQ tablet Take 1 tablet (10 mEq total) by mouth daily. 15 tablet 0  . vitamin B-12 (CYANOCOBALAMIN) 100 MCG tablet Take 100 mcg by mouth daily.     No current facility-administered medications on file prior to visit.     Past Medical History:  Diagnosis Date  . Blood transfusion without reported diagnosis   . Chronic low back pain   . Hyperlipidemia   . Osteopenia   . Unspecified essential hypertension   . Unspecified hypothyroidism     Past Surgical History:  Procedure Laterality Date  . ABDOMINAL HYSTERECTOMY  1970  . CHOLECYSTECTOMY  07/2009   Dr.  Rise Patience  . COLONOSCOPY  2003  . FLEXIBLE SIGMOIDOSCOPY  2010  . HAND SURGERY    . IR ANGIO INTRA EXTRACRAN SEL COM CAROTID INNOMINATE UNI L MOD SED  11/29/2016  . IR ANGIO VERTEBRAL SEL SUBCLAVIAN INNOMINATE UNI R MOD SED  11/29/2016  . IR PERCUTANEOUS ART THROMBECTOMY/INFUSION INTRACRANIAL INC DIAG ANGIO  11/29/2016  . LUMBAR LAMINECTOMY  11/2008   Done by Dr. Patrice Paradise  . RADIOLOGY WITH ANESTHESIA N/A 11/29/2016   Procedure: RADIOLOGY WITH ANESTHESIA;  Surgeon: Radiologist, Medication, MD;  Location: [facility];  Service: Radiology;  Laterality: N/A;  . THYROIDECTOMY      Social History   Social History  . Marital status: Married    Spouse name: N/A  . Number of children: N/A  . Years of education: N/A   Social History Main Topics  .  Smoking status: Never Smoker  . Smokeless tobacco: Never Used  . Alcohol use No  . Drug use: No  . Sexual activity: Not Currently   Other Topics Concern  . Not on file   Social History Narrative   Married    Family History  Problem Relation Age of Onset  . Heart disease Father 57       MI age 87s  . Lung cancer Brother 63  . Colon cancer Neg Hx   . Esophageal cancer Neg Hx   . Rectal cancer Neg Hx   . Stomach cancer Neg Hx     Review of Systems  Constitutional: Negative for appetite change, chills and fever.  Respiratory: Negative for cough, shortness of breath and wheezing.   Cardiovascular: Negative for chest pain, palpitations and leg swelling.  Gastrointestinal: Negative for abdominal pain and nausea.  Neurological: Negative for dizziness, light-headedness and headaches.  Psychiatric/Behavioral: Negative for dysphoric mood and sleep disturbance. The patient is not nervous/anxious.        Objective:   Vitals:   01/29/17 1046  BP: 134/74  Pulse: 87  Resp: 16  Temp: 98.1 F (36.7 C)   Wt Readings from Last 3 Encounters:  01/29/17 152 lb (68.9 kg)  01/19/17 156 lb 4.8 oz (70.9 kg)  01/13/17 176 lb (79.8 kg)   Body mass index  is 26.93 kg/m.   Physical Exam    Constitutional: Appears well-developed and well-nourished. No distress.  HENT:  Head: Normocephalic and atraumatic.  Neck: Neck supple. No tracheal deviation present. No thyromegaly present.  No cervical lymphadenopathy Cardiovascular: Normal rate, regular rhythm and normal heart sounds.   No murmur heard. No carotid bruit .  No edema Pulmonary/Chest: Effort normal and breath sounds normal. No respiratory distress. No has no wheezes. No rales.  Neuro: 4/5 strength in RUE, 5.5 strength in LUE, RLE strength 4/5, LLE strength 5/5, follows commands, mild dysarthria  Skin: Skin is warm and dry. Not diaphoretic.  Psychiatric: Normal mood and affect. Behavior is normal.   MRI head 01/12/17: IMPRESSION: 1. Reduced diffusion within left caudate body extending in the periventricular white matter and within the left medial thalamus increased in distribution in comparison with prior MRI of the brain. Findings are consistent with interval acute/early subacute infarction. 2. Decreased size of late subacute hematoma within the left basal ganglia. Left lateral ventricle hemosiderin staining. 3. Left anterior temporal lobe encephalomalacia from prior infarction. 4. Patent circle of Willis. No large vessel occlusion, aneurysm, or significant stenosis is identified.   CT head w/o contrast  01/11/17 IMPRESSION: Evolutionary changes and the area of hemorrhage in the left basal ganglia and prior infarcts seen in the anterior left temporal lobe. No acute infarction or hemorrhage. .  Assessment & Plan:    See Problem List for Assessment and Plan of chronic medical problems.

## 2017-01-29 ENCOUNTER — Encounter: Payer: Self-pay | Admitting: Internal Medicine

## 2017-01-29 ENCOUNTER — Ambulatory Visit (INDEPENDENT_AMBULATORY_CARE_PROVIDER_SITE_OTHER): Payer: Medicare Other | Admitting: Internal Medicine

## 2017-01-29 VITALS — BP 134/74 | HR 87 | Temp 98.1°F | Resp 16 | Wt 152.0 lb

## 2017-01-29 DIAGNOSIS — I69351 Hemiplegia and hemiparesis following cerebral infarction affecting right dominant side: Secondary | ICD-10-CM

## 2017-01-29 DIAGNOSIS — I10 Essential (primary) hypertension: Secondary | ICD-10-CM

## 2017-01-29 DIAGNOSIS — I69320 Aphasia following cerebral infarction: Secondary | ICD-10-CM

## 2017-01-29 DIAGNOSIS — F32A Depression, unspecified: Secondary | ICD-10-CM | POA: Insufficient documentation

## 2017-01-29 DIAGNOSIS — F329 Major depressive disorder, single episode, unspecified: Secondary | ICD-10-CM | POA: Insufficient documentation

## 2017-01-29 DIAGNOSIS — F3289 Other specified depressive episodes: Secondary | ICD-10-CM | POA: Diagnosis not present

## 2017-01-29 NOTE — Assessment & Plan Note (Addendum)
Her weakness has improved some Awaiting physical therapy to come out-we will check on this Her husband asked about possibly going back to rehabilitation if needed-we will have physical therapy evaluate and see what they recommend Blood pressure controlled Continue eliquis, statin

## 2017-01-29 NOTE — Assessment & Plan Note (Signed)
Speech is improving Continue speech therapy

## 2017-01-29 NOTE — Assessment & Plan Note (Signed)
BP Readings from Last 3 Encounters:  01/29/17 134/74  01/26/17 126/74  01/19/17 (!) 157/61   BP well controlled Current regimen effective and well tolerated Continue current medications at current doses

## 2017-01-29 NOTE — Assessment & Plan Note (Signed)
Taking Prozac daily Denies depression or anxiety Would prefer not to take the medication Advised she can stop it and restarted if anxiety or depression increases

## 2017-01-29 NOTE — Patient Instructions (Addendum)
  All other Health Maintenance issues reviewed.   All recommended immunizations and age-appropriate screenings are up-to-date or discussed.  No immunizations administered today.   Medications reviewed and updated.  Changes include  - you can try stopping the prozac and see how you do without it.   Your prescription(s) have been submitted to your pharmacy. Please take as directed and contact our office if you believe you are having problem(s) with the medication(s).    Please followup in 3 months

## 2017-02-01 ENCOUNTER — Other Ambulatory Visit: Payer: Self-pay | Admitting: Internal Medicine

## 2017-02-01 DIAGNOSIS — N183 Chronic kidney disease, stage 3 (moderate): Secondary | ICD-10-CM | POA: Diagnosis not present

## 2017-02-01 DIAGNOSIS — I130 Hypertensive heart and chronic kidney disease with heart failure and stage 1 through stage 4 chronic kidney disease, or unspecified chronic kidney disease: Secondary | ICD-10-CM | POA: Diagnosis not present

## 2017-02-01 DIAGNOSIS — Z9181 History of falling: Secondary | ICD-10-CM | POA: Diagnosis not present

## 2017-02-01 DIAGNOSIS — I4891 Unspecified atrial fibrillation: Secondary | ICD-10-CM | POA: Diagnosis not present

## 2017-02-01 DIAGNOSIS — I69351 Hemiplegia and hemiparesis following cerebral infarction affecting right dominant side: Secondary | ICD-10-CM | POA: Diagnosis not present

## 2017-02-01 DIAGNOSIS — I69320 Aphasia following cerebral infarction: Secondary | ICD-10-CM | POA: Diagnosis not present

## 2017-02-01 DIAGNOSIS — I5030 Unspecified diastolic (congestive) heart failure: Secondary | ICD-10-CM | POA: Diagnosis not present

## 2017-02-01 DIAGNOSIS — Z993 Dependence on wheelchair: Secondary | ICD-10-CM | POA: Diagnosis not present

## 2017-02-02 ENCOUNTER — Other Ambulatory Visit: Payer: Self-pay | Admitting: Internal Medicine

## 2017-02-02 DIAGNOSIS — I69351 Hemiplegia and hemiparesis following cerebral infarction affecting right dominant side: Secondary | ICD-10-CM | POA: Diagnosis not present

## 2017-02-02 DIAGNOSIS — I5030 Unspecified diastolic (congestive) heart failure: Secondary | ICD-10-CM | POA: Diagnosis not present

## 2017-02-02 DIAGNOSIS — I130 Hypertensive heart and chronic kidney disease with heart failure and stage 1 through stage 4 chronic kidney disease, or unspecified chronic kidney disease: Secondary | ICD-10-CM | POA: Diagnosis not present

## 2017-02-02 DIAGNOSIS — N183 Chronic kidney disease, stage 3 (moderate): Secondary | ICD-10-CM | POA: Diagnosis not present

## 2017-02-02 DIAGNOSIS — Z9181 History of falling: Secondary | ICD-10-CM | POA: Diagnosis not present

## 2017-02-02 DIAGNOSIS — Z993 Dependence on wheelchair: Secondary | ICD-10-CM | POA: Diagnosis not present

## 2017-02-02 DIAGNOSIS — I69320 Aphasia following cerebral infarction: Secondary | ICD-10-CM | POA: Diagnosis not present

## 2017-02-02 DIAGNOSIS — I4891 Unspecified atrial fibrillation: Secondary | ICD-10-CM | POA: Diagnosis not present

## 2017-02-03 ENCOUNTER — Other Ambulatory Visit: Payer: Self-pay | Admitting: Internal Medicine

## 2017-02-03 ENCOUNTER — Telehealth: Payer: Self-pay | Admitting: Emergency Medicine

## 2017-02-03 DIAGNOSIS — I5030 Unspecified diastolic (congestive) heart failure: Secondary | ICD-10-CM | POA: Diagnosis not present

## 2017-02-03 DIAGNOSIS — I69320 Aphasia following cerebral infarction: Secondary | ICD-10-CM | POA: Diagnosis not present

## 2017-02-03 DIAGNOSIS — I4891 Unspecified atrial fibrillation: Secondary | ICD-10-CM | POA: Diagnosis not present

## 2017-02-03 DIAGNOSIS — Z993 Dependence on wheelchair: Secondary | ICD-10-CM | POA: Diagnosis not present

## 2017-02-03 DIAGNOSIS — N183 Chronic kidney disease, stage 3 (moderate): Secondary | ICD-10-CM | POA: Diagnosis not present

## 2017-02-03 DIAGNOSIS — I130 Hypertensive heart and chronic kidney disease with heart failure and stage 1 through stage 4 chronic kidney disease, or unspecified chronic kidney disease: Secondary | ICD-10-CM | POA: Diagnosis not present

## 2017-02-03 DIAGNOSIS — I69351 Hemiplegia and hemiparesis following cerebral infarction affecting right dominant side: Secondary | ICD-10-CM | POA: Diagnosis not present

## 2017-02-03 DIAGNOSIS — Z9181 History of falling: Secondary | ICD-10-CM | POA: Diagnosis not present

## 2017-02-03 NOTE — Telephone Encounter (Signed)
I spoke with pts husband, pt is currently getting Pt. He states he has the concern of the medications costing too much. He does feel comfortable giving pt meds and does not feel that he needs help with that. Informed him that if a medication cost too much at [facility], we could try sending to another pharmacy that may be cheaper.

## 2017-02-03 NOTE — Telephone Encounter (Signed)
noted 

## 2017-02-03 NOTE — Telephone Encounter (Signed)
-----   Message from [name], MD sent at 02/02/2017  8:07 PM EDT ----- Let her husband know I ordered PT.  He thought he was having a pharmacist coming out to his house - does he still feel like he needs this -- what was it for again?   ----- Message ----- From: Melene Plan, CMA Sent: 02/01/2017   2:41 PM To: [name], MD  I dont see anything like that.. They have changed referrals and we arent able to see as much anymore.   ----- Message ----- From: [name], MD Sent: 02/01/2017  10:43 AM To: Melene Plan, CMA  Is there any pharmacy referral - they said they were told a pharmacist would be contacting them  ----- Message ----- From: Melene Plan, CMA Sent: 02/01/2017  10:24 AM To: [name], MD  I do not see any referrals for PT from the hospital or any current orders in media, please enter.   ----- Message ----- From: [name], MD Sent: 01/29/2017  11:17 AM To: Melene Plan, CMA  PT has not come out to her house or called her -- not sure who their home care agency is -- do we need to reorder?

## 2017-02-04 ENCOUNTER — Telehealth: Payer: Self-pay | Admitting: Internal Medicine

## 2017-02-04 DIAGNOSIS — I5030 Unspecified diastolic (congestive) heart failure: Secondary | ICD-10-CM | POA: Diagnosis not present

## 2017-02-04 DIAGNOSIS — I69351 Hemiplegia and hemiparesis following cerebral infarction affecting right dominant side: Secondary | ICD-10-CM | POA: Diagnosis not present

## 2017-02-04 DIAGNOSIS — I130 Hypertensive heart and chronic kidney disease with heart failure and stage 1 through stage 4 chronic kidney disease, or unspecified chronic kidney disease: Secondary | ICD-10-CM | POA: Diagnosis not present

## 2017-02-04 DIAGNOSIS — Z993 Dependence on wheelchair: Secondary | ICD-10-CM | POA: Diagnosis not present

## 2017-02-04 DIAGNOSIS — N183 Chronic kidney disease, stage 3 (moderate): Secondary | ICD-10-CM | POA: Diagnosis not present

## 2017-02-04 DIAGNOSIS — Z9181 History of falling: Secondary | ICD-10-CM | POA: Diagnosis not present

## 2017-02-04 DIAGNOSIS — I69320 Aphasia following cerebral infarction: Secondary | ICD-10-CM | POA: Diagnosis not present

## 2017-02-04 DIAGNOSIS — I4891 Unspecified atrial fibrillation: Secondary | ICD-10-CM | POA: Diagnosis not present

## 2017-02-04 NOTE — Telephone Encounter (Signed)
LVM with Clair Gulling giving verbal orders per MD

## 2017-02-04 NOTE — Telephone Encounter (Signed)
Valerie Mcclure , with [facility]  [phone]   Need verbals for OT   2 week 5 1 week 1

## 2017-02-05 ENCOUNTER — Other Ambulatory Visit: Payer: Self-pay

## 2017-02-05 DIAGNOSIS — I130 Hypertensive heart and chronic kidney disease with heart failure and stage 1 through stage 4 chronic kidney disease, or unspecified chronic kidney disease: Secondary | ICD-10-CM | POA: Diagnosis not present

## 2017-02-05 DIAGNOSIS — I69320 Aphasia following cerebral infarction: Secondary | ICD-10-CM | POA: Diagnosis not present

## 2017-02-05 DIAGNOSIS — I6330 Cerebral infarction due to thrombosis of unspecified cerebral artery: Secondary | ICD-10-CM | POA: Diagnosis not present

## 2017-02-05 DIAGNOSIS — Z9181 History of falling: Secondary | ICD-10-CM | POA: Diagnosis not present

## 2017-02-05 DIAGNOSIS — N183 Chronic kidney disease, stage 3 (moderate): Secondary | ICD-10-CM | POA: Diagnosis not present

## 2017-02-05 DIAGNOSIS — Z993 Dependence on wheelchair: Secondary | ICD-10-CM | POA: Diagnosis not present

## 2017-02-05 DIAGNOSIS — I4891 Unspecified atrial fibrillation: Secondary | ICD-10-CM | POA: Diagnosis not present

## 2017-02-05 DIAGNOSIS — I69351 Hemiplegia and hemiparesis following cerebral infarction affecting right dominant side: Secondary | ICD-10-CM | POA: Diagnosis not present

## 2017-02-05 DIAGNOSIS — I5030 Unspecified diastolic (congestive) heart failure: Secondary | ICD-10-CM | POA: Diagnosis not present

## 2017-02-05 NOTE — Patient Outreach (Signed)
[facility] ([facility]) Care Management  02/05/2017   Valerie Mcclure 08/17/1940 [id number]  Subjective:  She is doing much better. (husband spoke on behalf of patient)   Objective:  Elderly lady who is recovering from a CVA,   Current Medications:  Current Outpatient Prescriptions  Medication Sig Dispense Refill  . amLODipine (NORVASC) 10 MG tablet Take 1 tablet (10 mg total) by mouth daily. 30 tablet 0  . apixaban (ELIQUIS) 5 MG TABS tablet Take 1 tablet (5 mg total) by mouth 2 (two) times daily. 60 tablet 0  . atorvastatin (LIPITOR) 10 MG tablet Take 0.5 tablets (5 mg total) by mouth daily at 6 PM. 30 tablet 0  . Calcium Carbonate-Vit D-Min (CALCIUM 1200 PO) Take 1 tablet by mouth daily.     . Cholecalciferol (VITAMIN D3) 1000 UNITS CAPS Take 1,000 Units by mouth daily.     . FLUoxetine (PROZAC) 20 MG capsule Take 1 capsule (20 mg total) by mouth daily. 30 capsule 11  . levothyroxine (SYNTHROID, LEVOTHROID) 88 MCG tablet Take 1 tablet (88 mcg total) by mouth daily. 90 tablet 1  . Maltodextrin-Xanthan Gum (RESOURCE THICKENUP CLEAR) POWD Use as needed to get liquid to nectar thick consistency 3 Can 1  . metoprolol tartrate (LOPRESSOR) 25 MG tablet Take 1 tablet (25 mg total) by mouth 2 (two) times daily. 60 tablet 0  . metoprolol tartrate (LOPRESSOR) 25 MG tablet TAKE 1 TABLET(25 MG) BY MOUTH TWICE DAILY 180 tablet 1  . nortriptyline (PAMELOR) 10 MG capsule Take 2 capsules (20 mg total) by mouth at bedtime. 180 capsule 1  . nortriptyline (PAMELOR) 10 MG capsule TAKE 2 CAPSULES BY MOUTH AT BEDTIME 180 capsule 1  . polycarbophil (FIBERCON) 625 MG tablet Take 2 tablets (1,250 mg total) by mouth daily. 60 tablet 0  . potassium chloride (K-DUR) 10 MEQ tablet Take 1 tablet (10 mEq total) by mouth daily. 15 tablet 0  . vitamin B-12 (CYANOCOBALAMIN) 100 MCG tablet Take 100 mcg by mouth daily.     No current facility-administered medications for this visit.     Functional  Status:  In your present state of health, do you have any difficulty performing the following activities: 01/26/2017 01/15/2017  Hearing? N -  Vision? Y -  Difficulty concentrating or making decisions? N -  Walking or climbing stairs? Y -  Dressing or bathing? Y -  Doing errands, shopping? Y Y  Preparing Food and eating ? Y -  Using the Toilet? Y -  In the past six months, have you accidently leaked urine? Y -  Do you have problems with loss of bowel control? Y -  Managing your Medications? Y -  Managing your Finances? Y -  Housekeeping or managing your Housekeeping? Y -  Some recent data might be hidden    Fall/Depression Screening: Fall Risk  01/26/2017 01/21/2017 04/07/2016  Falls in the past year? Yes Yes No  Number falls in past yr: 2 or more 1 -  Injury with Fall? No No -  Risk Factor Category  High Fall Risk - -  Risk for fall due to : History of fall(s);Impaired balance/gait;Impaired mobility;Impaired vision;Medication side effect History of fall(s);Impaired mobility;Mental status change -  Follow up Falls prevention discussed Falls evaluation completed -   PHQ 2/9 Scores 01/26/2017 01/21/2017 04/07/2016 04/03/2014 11/14/2012  PHQ - 2 Score 4 - 0 0 0  PHQ- 9 Score 10 - - - -  Exception Documentation - Other- indicate reason in comment box - - -    Not completed - unable to assess, spoke with husband - - -   [facility] CM Care Plan Problem One     Most Recent Value  Care Plan Problem One  recent life changing CVA  Role Documenting the Problem One  Care Management Coordinator  Care Plan for Problem One  Active  [facility] Long Term Goal   patient will have no acute care admission in the next 31 days  [facility] Long Term Goal Start Date  01/26/17  Interventions for Problem One Long Term Goal  7/20 patient's husband denies admissions to the acute care setting since the last encounter    [facility] CM Care Plan Problem Two     Most Recent Value  Care Plan Problem Two  patient and husband stated  they are  having a hard time paying for medications  Role Documenting the Problem Two  Care Management Coordinator  Care Plan for Problem Two  Active  Interventions for Problem Two Long Term Goal   7/20 patient's husband william reports [name] LCSW from [facility] came to their home for psychosocial assessment/support on yesterday  [facility] Long Term Goal  Patient has PHQ scale of over 9  [facility] Long Term Goal Start Date  01/26/17  [facility] Long Term Goal Met Date  02/05/17    [facility] CM Care Plan Problem Three     Most Recent Value  Care Plan Problem Three  patient is at risk for fall  Role Documenting the Problem Three  Care Management Coordinator  Care Plan for Problem Three  Active  [facility] Long Term Goal   In the next 31 days, patient will meet with [facility] RNCM for fall prevention education  [facility] Long Term Goal Start Date  01/26/17  [facility] Long Term Goal Met Date  02/05/17  Interventions for Problem Three Long Term Goal  7/20 patient's husband states he and his wife have no further educational needs at this time.     Assessment:  Patient's husband states he notices improvements in patient conditions. Patient's husband states there is still a need for assistance with finding lower cost medication. RNCM will re-enter referral for [facility]acy.  Plan:  Home in the next 21 days for community care coordination.      

## 2017-02-08 DIAGNOSIS — I69320 Aphasia following cerebral infarction: Secondary | ICD-10-CM | POA: Diagnosis not present

## 2017-02-08 DIAGNOSIS — Z993 Dependence on wheelchair: Secondary | ICD-10-CM | POA: Diagnosis not present

## 2017-02-08 DIAGNOSIS — I69351 Hemiplegia and hemiparesis following cerebral infarction affecting right dominant side: Secondary | ICD-10-CM | POA: Diagnosis not present

## 2017-02-08 DIAGNOSIS — Z9181 History of falling: Secondary | ICD-10-CM | POA: Diagnosis not present

## 2017-02-08 DIAGNOSIS — I130 Hypertensive heart and chronic kidney disease with heart failure and stage 1 through stage 4 chronic kidney disease, or unspecified chronic kidney disease: Secondary | ICD-10-CM | POA: Diagnosis not present

## 2017-02-08 DIAGNOSIS — I4891 Unspecified atrial fibrillation: Secondary | ICD-10-CM | POA: Diagnosis not present

## 2017-02-08 DIAGNOSIS — N183 Chronic kidney disease, stage 3 (moderate): Secondary | ICD-10-CM | POA: Diagnosis not present

## 2017-02-08 DIAGNOSIS — I5030 Unspecified diastolic (congestive) heart failure: Secondary | ICD-10-CM | POA: Diagnosis not present

## 2017-02-09 ENCOUNTER — Other Ambulatory Visit: Payer: Self-pay

## 2017-02-09 ENCOUNTER — Telehealth: Payer: Self-pay | Admitting: Internal Medicine

## 2017-02-09 ENCOUNTER — Other Ambulatory Visit: Payer: Self-pay | Admitting: Pharmacist

## 2017-02-09 DIAGNOSIS — Z993 Dependence on wheelchair: Secondary | ICD-10-CM | POA: Diagnosis not present

## 2017-02-09 DIAGNOSIS — N183 Chronic kidney disease, stage 3 (moderate): Secondary | ICD-10-CM | POA: Diagnosis not present

## 2017-02-09 DIAGNOSIS — Z9181 History of falling: Secondary | ICD-10-CM | POA: Diagnosis not present

## 2017-02-09 DIAGNOSIS — I5030 Unspecified diastolic (congestive) heart failure: Secondary | ICD-10-CM | POA: Diagnosis not present

## 2017-02-09 DIAGNOSIS — I130 Hypertensive heart and chronic kidney disease with heart failure and stage 1 through stage 4 chronic kidney disease, or unspecified chronic kidney disease: Secondary | ICD-10-CM | POA: Diagnosis not present

## 2017-02-09 DIAGNOSIS — I69320 Aphasia following cerebral infarction: Secondary | ICD-10-CM | POA: Diagnosis not present

## 2017-02-09 DIAGNOSIS — I69351 Hemiplegia and hemiparesis following cerebral infarction affecting right dominant side: Secondary | ICD-10-CM | POA: Diagnosis not present

## 2017-02-09 DIAGNOSIS — I4891 Unspecified atrial fibrillation: Secondary | ICD-10-CM | POA: Diagnosis not present

## 2017-02-09 NOTE — Telephone Encounter (Signed)
Called [name] no answer LMOm w/MD response...Valerie Mcclure

## 2017-02-09 NOTE — Patient Outreach (Signed)
[facility]) Care Management  [place]   02/09/2017  Valerie Mcclure 12/31/1940 [id number]  Subjective:  Patient was referred to [place] by [place] for medication patient assistance evaluation.   Patient has a past medical history including, hypertension, atrial flutter, hypothyroidism, hyperlipidemia, history of CVA.    She was admitted at [facility] 6/25-6/28/18 and then [facility] re[facility] 6/28-01/19/17.   Successful phone outreach to patient's daughter, [name], on [facility] Consent and HIPAA details of patient were verified.   Patient's daughter, reports patient has help getting her daily medication from her spouse----they deny needing assistance with medication management.  They report main concern is cost of medications.  [name] was able to review medications with [facility] Pharmacist.     Objective:   Current Medications: Current Outpatient Prescriptions  Medication Sig Dispense Refill  . amLODipine (NORVASC) 10 MG tablet Take 1 tablet (10 mg total) by mouth daily. 30 tablet 0  . apixaban (ELIQUIS) 5 MG TABS tablet Take 1 tablet (5 mg total) by mouth 2 (two) times daily. 60 tablet 0  . atorvastatin (LIPITOR) 10 MG tablet Take 0.5 tablets (5 mg total) by mouth daily at 6 PM. 30 tablet 0  . Calcium Carbonate-Vit D-Min (CALCIUM 1200 PO) Take 1 tablet by mouth daily.     . Cholecalciferol (VITAMIN D3) 1000 UNITS CAPS Take 1,000 Units by mouth daily.     Marland Kitchen FLUoxetine (PROZAC) 20 MG capsule Take 1 capsule (20 mg total) by mouth daily. (Patient not taking: Reported on 02/09/2017) 30 capsule 11  . levothyroxine (SYNTHROID, LEVOTHROID) 88 MCG tablet Take 1 tablet (88 mcg total) by mouth daily. 90 tablet 1  . Maltodextrin-Xanthan Gum (RESOURCE THICKENUP CLEAR) POWD Use as needed to get liquid to nectar thick consistency 3 Can 1  . metoprolol tartrate (LOPRESSOR) 25 MG tablet Take 1 tablet (25 mg total) by mouth 2 (two) times daily. 60 tablet 0  .  metoprolol tartrate (LOPRESSOR) 25 MG tablet TAKE 1 TABLET(25 MG) BY MOUTH TWICE DAILY 180 tablet 1  . nortriptyline (PAMELOR) 10 MG capsule Take 2 capsules (20 mg total) by mouth at bedtime. 180 capsule 1  . nortriptyline (PAMELOR) 10 MG capsule TAKE 2 CAPSULES BY MOUTH AT BEDTIME 180 capsule 1  . polycarbophil (FIBERCON) 625 MG tablet Take 2 tablets (1,250 mg total) by mouth daily. 60 tablet 0  . potassium chloride (K-DUR) 10 MEQ tablet Take 1 tablet (10 mEq total) by mouth daily. 15 tablet 0  . vitamin B-12 (CYANOCOBALAMIN) 100 MCG tablet Take 100 mcg by mouth daily.     No current facility-administered medications for this visit.     Functional Status: In your present state of health, do you have any difficulty performing the following activities: 01/26/2017 01/15/2017  Hearing? N -  Vision? Y -  Difficulty concentrating or making decisions? N -  Walking or climbing stairs? Y -  Dressing or bathing? Y -  Doing errands, shopping? [name]  Preparing Food and eating ? Y -  Using the Toilet? Y -  In the past six months, have you accidently leaked urine? Y -  Do you have problems with loss of bowel control? Y -  Managing your Medications? Y -  Managing your Finances? Y -  Housekeeping or managing your Housekeeping? Y -  Some recent data might be hidden    Fall/Depression Screening: Fall Risk  01/26/2017 01/21/2017 04/07/2016  Falls in the past year? Yes Yes No  Number falls in past yr: 2 or more 1 -  Injury with Fall? No No -  Risk Factor Category  High Fall Risk - -  Risk for fall due to : History of fall(s);Impaired balance/gait;Impaired mobility;Impaired vision;Medication side effect History of fall(s);Impaired mobility;Mental status change -  Follow up Falls prevention discussed Falls evaluation completed -   PHQ 2/9 Scores 01/26/2017 01/21/2017 04/07/2016 04/03/2014 11/14/2012  PHQ - 2 Score 4 - 0 0 0  PHQ- 9 Score 10 - - - -  Exception Documentation - Other- indicate reason in comment  box - - -  Not completed - unable to assess, spoke with husband - - -    Assessment:  Medication review per review of medications in chart, discharge medication list, and report of patient's medications from her daughter.  Patient was recently discharged from hospital and all medications have been reviewed.  Drugs sorted by system:  Neurologic/Psychologic: -nortriptyline  Cardiovascular: -amlodipine -atorvastatin -metoprolol tartrate -potassium chloride -apixaban (Eliquis)  Endocrine: -levothyroxine  Vitamins/Minerals: -calcium -vitamin D -cyanocobalamin  On discharge from [facility] rehab the following medications were stopped: -furosemide -morphine  -simvastatin (changed to atorvastatin) -valsartan  Other issues noted:  -potassium appears to have been decreased from 20 mEq daily to 10 mEq daily on discharge---daughter reports patient still taking 20 mEq daily  -daughter reports patient stopped taking fluoxetine---it is on medication list still and confirmed per 01/29/17 PCP note, PCP did okay patient to stop fluoxetine  Medication assistance  -Daughter reports patient and husband income exceeds SSA Extra Help requirements. -Discussed [place] Patient Assistance Program requirements---daughter reports patient has not met out-of-pocket prescription spend requirement yet.    -Discussed patient may have cost savings on Tier 1 and Tier 2 generic medications via [facility] mail order which may offer $0 co-pay/90 day supply while patient is in initial coverage phase vs paying monthly co-pay at local pharmacy.   -Discussed can have prescriber of Eliquis attempt Tier Exception request from patient's insurance company---there is no guarantee insurance would accept request.   Daughter is unsure who will continue prescribing Eliquis post discharge.    Plan:  Will route note and barriers letter to PCP.   Will get PCP Tier exception request form for patient  insurance if they wish to attempt Tier Exception.   Will place follow-up call to patient/daughter next month.   Karrie Meres, PharmD, [place] [phone]

## 2017-02-09 NOTE — Telephone Encounter (Signed)
MD out of office pls advise on msg below.../lmb 

## 2017-02-09 NOTE — Telephone Encounter (Signed)
Requesting verbal for speech therapy for aphasia effective 7/29.  3 times a week for 3 weeks and 1 time a week for 1 week.

## 2017-02-09 NOTE — Patient Outreach (Signed)
[facility]) Care Management   02/09/2017  Valerie Mcclure May 13, 1941 [id number]  Valerie Mcclure is an 76 y.o. female  Subjective:  I am feeling so much better.  Objective:   ROS Well dressed elderly lady sitting on sofa in her living room.  Physical Exam ROS  Encounter Medications:   Outpatient Encounter Prescriptions as of 02/09/2017  Medication Sig  . amLODipine (NORVASC) 10 MG tablet Take 1 tablet (10 mg total) by mouth daily.  Marland Kitchen apixaban (ELIQUIS) 5 MG TABS tablet Take 1 tablet (5 mg total) by mouth 2 (two) times daily.  Marland Kitchen atorvastatin (LIPITOR) 10 MG tablet Take 0.5 tablets (5 mg total) by mouth daily at 6 PM.  . Calcium Carbonate-Vit D-Min (CALCIUM 1200 PO) Take 1 tablet by mouth daily.   . Cholecalciferol (VITAMIN D3) 1000 UNITS CAPS Take 1,000 Units by mouth daily.   Marland Kitchen FLUoxetine (PROZAC) 20 MG capsule Take 1 capsule (20 mg total) by mouth daily. (Patient not taking: Reported on 02/09/2017)  . levothyroxine (SYNTHROID, LEVOTHROID) 88 MCG tablet Take 1 tablet (88 mcg total) by mouth daily.  . Maltodextrin-Xanthan Gum (RESOURCE THICKENUP CLEAR) POWD Use as needed to get liquid to nectar thick consistency  . metoprolol tartrate (LOPRESSOR) 25 MG tablet Take 1 tablet (25 mg total) by mouth 2 (two) times daily.  . metoprolol tartrate (LOPRESSOR) 25 MG tablet TAKE 1 TABLET(25 MG) BY MOUTH TWICE DAILY  . nortriptyline (PAMELOR) 10 MG capsule Take 2 capsules (20 mg total) by mouth at bedtime.  . nortriptyline (PAMELOR) 10 MG capsule TAKE 2 CAPSULES BY MOUTH AT BEDTIME  . polycarbophil (FIBERCON) 625 MG tablet Take 2 tablets (1,250 mg total) by mouth daily.  . potassium chloride (K-DUR) 10 MEQ tablet Take 1 tablet (10 mEq total) by mouth daily.  . vitamin B-12 (CYANOCOBALAMIN) 100 MCG tablet Take 100 mcg by mouth daily.   No facility-administered encounter medications on file as of 02/09/2017.     Functional Status:   In your present state of health, do  you have any difficulty performing the following activities: 01/26/2017 01/15/2017  Hearing? N -  Vision? Y -  Difficulty concentrating or making decisions? N -  Walking or climbing stairs? Y -  Dressing or bathing? Y -  Doing errands, shopping? Valerie Mcclure  Preparing Food and eating ? Y -  Using the Toilet? Y -  In the past six months, have you accidently leaked urine? Y -  Do you have problems with loss of bowel control? Y -  Managing your Medications? Y -  Managing your Finances? Y -  Housekeeping or managing your Housekeeping? Y -  Some recent data might be hidden    Fall/Depression Screening:    Fall Risk  01/26/2017 01/21/2017 04/07/2016  Falls in the past year? Yes Yes No  Number falls in past yr: 2 or more 1 -  Injury with Fall? No No -  Risk Factor Category  High Fall Risk - -  Risk for fall due to : History of fall(s);Impaired balance/gait;Impaired mobility;Impaired vision;Medication side effect History of fall(s);Impaired mobility;Mental status change -  Follow up Falls prevention discussed Falls evaluation completed -   PHQ 2/9 Scores 01/26/2017 01/21/2017 04/07/2016 04/03/2014 11/14/2012  PHQ - 2 Score 4 - 0 0 0  PHQ- 9 Score 10 - - - -  Exception Documentation - Other- indicate reason in comment box - - -  Not completed - unable to assess, spoke with husband - - -  [facility] CM Care Plan Problem One     Most Recent Value  Care Plan Problem One  recent life changing CVA  Role Documenting the Problem One  Care Management Coordinator  Care Plan for Problem One  Active  [facility] Long Term Goal   patient will have no acute care admission in the next 31 days  [facility] Long Term Goal Start Date  01/26/17  Interventions for Problem One Long Term Goal  02/09/2017 routine home visit to assess patient's progress in meeting her case management goals.  Patient has had not acute care admissions since our last encounter    [facility] CM Care Plan Problem Two     Most Recent Value  Care Plan Problem Two  patient and  husband stated  they are having a hard time paying for medications  Role Documenting the Problem Two  Care Management [place] for Problem Two  Active  Interventions for Problem Two Long Term Goal   7/20 patient's husband Valerie Mcclure reports Valerie Best LCSW from [facility] came to their home for psychosocial assessment/support on yesterday  [place] Term Goal  Patient has PHQ scale of over 9  [facility] Long Term Goal Start Date  01/26/17  [facility] Long Term Goal Met Date  02/05/17    [facility] CM Care Plan Problem Three     Most Recent Value  Care Plan Problem Three  patient is at risk for fall  Role Documenting the Problem Three  Care Management Coordinator  Care Plan for Problem Three  Active  [facility] Long Term Goal   In the next 31 days, patient will meet with [facility] RNCM for fall prevention education  [facility] Long Term Goal Start Date  01/26/17  Interventions for Problem Three Long Term Goal  02/09/2017 patient reports having no falls since the last encounter     Assessment:   Patient continues to progress towards meeting her case management goals. Patient has referral with [place] pending to assist with assessing for lower cost medications. Patient endorses continued support from daughter and husband. Patient continues to receive services from [facility] for PT/OT/LCSW.  Plan:  Telephone contact in the next 21 days for assessment of progress towards meeting her case management goals.

## 2017-02-09 NOTE — Telephone Encounter (Signed)
Ok for verbal 

## 2017-02-10 DIAGNOSIS — Z9181 History of falling: Secondary | ICD-10-CM | POA: Diagnosis not present

## 2017-02-10 DIAGNOSIS — I5030 Unspecified diastolic (congestive) heart failure: Secondary | ICD-10-CM | POA: Diagnosis not present

## 2017-02-10 DIAGNOSIS — I69320 Aphasia following cerebral infarction: Secondary | ICD-10-CM | POA: Diagnosis not present

## 2017-02-10 DIAGNOSIS — I69351 Hemiplegia and hemiparesis following cerebral infarction affecting right dominant side: Secondary | ICD-10-CM | POA: Diagnosis not present

## 2017-02-10 DIAGNOSIS — I130 Hypertensive heart and chronic kidney disease with heart failure and stage 1 through stage 4 chronic kidney disease, or unspecified chronic kidney disease: Secondary | ICD-10-CM | POA: Diagnosis not present

## 2017-02-10 DIAGNOSIS — I4891 Unspecified atrial fibrillation: Secondary | ICD-10-CM | POA: Diagnosis not present

## 2017-02-10 DIAGNOSIS — N183 Chronic kidney disease, stage 3 (moderate): Secondary | ICD-10-CM | POA: Diagnosis not present

## 2017-02-10 DIAGNOSIS — Z993 Dependence on wheelchair: Secondary | ICD-10-CM | POA: Diagnosis not present

## 2017-02-11 DIAGNOSIS — I69351 Hemiplegia and hemiparesis following cerebral infarction affecting right dominant side: Secondary | ICD-10-CM | POA: Diagnosis not present

## 2017-02-11 DIAGNOSIS — I4891 Unspecified atrial fibrillation: Secondary | ICD-10-CM | POA: Diagnosis not present

## 2017-02-11 DIAGNOSIS — I69320 Aphasia following cerebral infarction: Secondary | ICD-10-CM | POA: Diagnosis not present

## 2017-02-11 DIAGNOSIS — N183 Chronic kidney disease, stage 3 (moderate): Secondary | ICD-10-CM | POA: Diagnosis not present

## 2017-02-11 DIAGNOSIS — Z9181 History of falling: Secondary | ICD-10-CM | POA: Diagnosis not present

## 2017-02-11 DIAGNOSIS — Z993 Dependence on wheelchair: Secondary | ICD-10-CM | POA: Diagnosis not present

## 2017-02-11 DIAGNOSIS — I5030 Unspecified diastolic (congestive) heart failure: Secondary | ICD-10-CM | POA: Diagnosis not present

## 2017-02-11 DIAGNOSIS — I130 Hypertensive heart and chronic kidney disease with heart failure and stage 1 through stage 4 chronic kidney disease, or unspecified chronic kidney disease: Secondary | ICD-10-CM | POA: Diagnosis not present

## 2017-02-12 ENCOUNTER — Telehealth: Payer: Self-pay | Admitting: Internal Medicine

## 2017-02-12 ENCOUNTER — Encounter: Payer: Medicare Other | Attending: Physical Medicine & Rehabilitation | Admitting: Physical Medicine & Rehabilitation

## 2017-02-12 ENCOUNTER — Encounter: Payer: Self-pay | Admitting: Physical Medicine & Rehabilitation

## 2017-02-12 VITALS — BP 142/77 | HR 68

## 2017-02-12 DIAGNOSIS — G8929 Other chronic pain: Secondary | ICD-10-CM

## 2017-02-12 DIAGNOSIS — I69320 Aphasia following cerebral infarction: Secondary | ICD-10-CM | POA: Diagnosis not present

## 2017-02-12 DIAGNOSIS — M545 Low back pain: Secondary | ICD-10-CM | POA: Insufficient documentation

## 2017-02-12 DIAGNOSIS — I4891 Unspecified atrial fibrillation: Secondary | ICD-10-CM | POA: Diagnosis not present

## 2017-02-12 DIAGNOSIS — I130 Hypertensive heart and chronic kidney disease with heart failure and stage 1 through stage 4 chronic kidney disease, or unspecified chronic kidney disease: Secondary | ICD-10-CM | POA: Diagnosis not present

## 2017-02-12 DIAGNOSIS — E785 Hyperlipidemia, unspecified: Secondary | ICD-10-CM | POA: Diagnosis not present

## 2017-02-12 DIAGNOSIS — Z8249 Family history of ischemic heart disease and other diseases of the circulatory system: Secondary | ICD-10-CM | POA: Insufficient documentation

## 2017-02-12 DIAGNOSIS — E876 Hypokalemia: Secondary | ICD-10-CM | POA: Diagnosis not present

## 2017-02-12 DIAGNOSIS — I69322 Dysarthria following cerebral infarction: Secondary | ICD-10-CM

## 2017-02-12 DIAGNOSIS — R269 Unspecified abnormalities of gait and mobility: Secondary | ICD-10-CM | POA: Insufficient documentation

## 2017-02-12 DIAGNOSIS — N189 Chronic kidney disease, unspecified: Secondary | ICD-10-CM | POA: Insufficient documentation

## 2017-02-12 DIAGNOSIS — I69351 Hemiplegia and hemiparesis following cerebral infarction affecting right dominant side: Secondary | ICD-10-CM | POA: Diagnosis not present

## 2017-02-12 DIAGNOSIS — R1310 Dysphagia, unspecified: Secondary | ICD-10-CM | POA: Insufficient documentation

## 2017-02-12 DIAGNOSIS — E669 Obesity, unspecified: Secondary | ICD-10-CM

## 2017-02-12 DIAGNOSIS — I69393 Ataxia following cerebral infarction: Secondary | ICD-10-CM | POA: Diagnosis not present

## 2017-02-12 DIAGNOSIS — I129 Hypertensive chronic kidney disease with stage 1 through stage 4 chronic kidney disease, or unspecified chronic kidney disease: Secondary | ICD-10-CM | POA: Diagnosis not present

## 2017-02-12 DIAGNOSIS — G8191 Hemiplegia, unspecified affecting right dominant side: Secondary | ICD-10-CM | POA: Diagnosis not present

## 2017-02-12 DIAGNOSIS — Z801 Family history of malignant neoplasm of trachea, bronchus and lung: Secondary | ICD-10-CM | POA: Diagnosis not present

## 2017-02-12 DIAGNOSIS — M8580 Other specified disorders of bone density and structure, unspecified site: Secondary | ICD-10-CM | POA: Diagnosis not present

## 2017-02-12 DIAGNOSIS — E039 Hypothyroidism, unspecified: Secondary | ICD-10-CM | POA: Diagnosis not present

## 2017-02-12 DIAGNOSIS — Z993 Dependence on wheelchair: Secondary | ICD-10-CM | POA: Diagnosis not present

## 2017-02-12 DIAGNOSIS — Z9181 History of falling: Secondary | ICD-10-CM | POA: Diagnosis not present

## 2017-02-12 DIAGNOSIS — I10 Essential (primary) hypertension: Secondary | ICD-10-CM

## 2017-02-12 DIAGNOSIS — I69390 Apraxia following cerebral infarction: Secondary | ICD-10-CM | POA: Insufficient documentation

## 2017-02-12 DIAGNOSIS — N183 Chronic kidney disease, stage 3 (moderate): Secondary | ICD-10-CM | POA: Diagnosis not present

## 2017-02-12 DIAGNOSIS — I5030 Unspecified diastolic (congestive) heart failure: Secondary | ICD-10-CM | POA: Diagnosis not present

## 2017-02-12 LAB — BASIC METABOLIC PANEL
BUN/Creatinine Ratio: 12 (ref 12–28)
BUN: 16 mg/dL (ref 8–27)
CALCIUM: 10.2 mg/dL (ref 8.7–10.3)
CO2: 25 mmol/L (ref 20–29)
CREATININE: 1.3 mg/dL — AB (ref 0.57–1.00)
Chloride: 105 mmol/L (ref 96–106)
GFR calc Af Amer: 46 mL/min/{1.73_m2} — ABNORMAL LOW (ref >59)
GFR, EST NON AFRICAN AMERICAN: 40 mL/min/{1.73_m2} — AB (ref >59)
Glucose: 111 mg/dL — ABNORMAL HIGH (ref 65–99)
POTASSIUM: 4.1 mmol/L (ref 3.5–5.2)
Sodium: 140 mmol/L (ref 134–144)

## 2017-02-12 NOTE — Telephone Encounter (Signed)
Pt daughter called asking for a refill of her mothers apixaban (ELIQUIS) 5 MG TABS tablet  This is not prescribed by Dr. Quay Burow and I asked her who normally prescribed it and she said Lauraine Rinne, and I asked why he would not refill it and she said it was a one time thing from the stroke doctor. I told her I can send a message to Dr. Quay Burow assistant but Dr. Quay Burow is on vacation so I would have to wait for a response but your mother may need an appointment because Dr. Quay Burow has never prescribed this before. She asked if I could call her back and tell her whats going on and I said yes but Dr. Quay Burow is on vacation and I will have to wait for response and she said we will call back Monday and hung up, so I was not able to tell her how long Dr. Quay Burow would be out

## 2017-02-12 NOTE — Progress Notes (Signed)
Subjective:    Patient ID: Valerie Mcclure, female    DOB: 1940-08-25, 76 y.o.   MRN: [id number]  HPI  76 y.o. female with history of chronic LBP, CKD, L-MCA infarct s/p revascularization with hemorrhagic transformation with resultant right sided weakness, aphasia and inattention and new diagnosis of AFib presents for hospital follow up for left CVA.    At discharge, she was instructed to follow up with Neurology, she does not have an appointment.  Husband present, who provides some of history. She saw her PCP. She does not have issues with swallowing on a regular diet now.  She still has 24/7 supervision.  She has not had blood work since leaving the hospital. She is no longer taking morphine IR. BP is relatively controlled. Denies falls.   Pain Inventory Average Pain 4 Pain Right Now 4 My pain is constant and aching  In the last 24 hours, has pain interfered with the following? General activity 6 Relation with others 6 Enjoyment of life 5 What TIME of day is your pain at its worst? morning Sleep (in general) Good  Pain is worse with: walking Pain improves with: na Relief from Meds: na  Mobility walk with assistance use a cane how many minutes can you walk? 4 ability to climb steps?  no do you drive?  no use a wheelchair needs help with transfers  Function retired I need assistance with the following:  dressing, bathing, toileting, meal prep, household duties and shopping  Neuro/Psych trouble walking confusion anxiety  Prior Studies Any changes since last visit?  no  Physicians involved in your care Any changes since last visit?  no   Family History  Problem Relation Age of Onset  . Heart disease Father 20       MI age 58s  . Lung cancer Brother 39  . Colon cancer Neg Hx   . Esophageal cancer Neg Hx   . Rectal cancer Neg Hx   . Stomach cancer Neg Hx    Social History   Social History  . Marital status: Married    Spouse name: N/A  . Number of  children: N/A  . Years of education: N/A   Social History Main Topics  . Smoking status: Never Smoker  . Smokeless tobacco: Never Used  . Alcohol use No  . Drug use: No  . Sexual activity: Not Currently   Other Topics Concern  . None   Social History Narrative   Married   Past Surgical History:  Procedure Laterality Date  . ABDOMINAL HYSTERECTOMY  1970  . CHOLECYSTECTOMY  07/2009   Dr. Rise Patience  . COLONOSCOPY  2003  . FLEXIBLE SIGMOIDOSCOPY  2010  . HAND SURGERY    . IR ANGIO INTRA EXTRACRAN SEL COM CAROTID INNOMINATE UNI L MOD SED  11/29/2016  . IR ANGIO VERTEBRAL SEL SUBCLAVIAN INNOMINATE UNI R MOD SED  11/29/2016  . IR PERCUTANEOUS ART THROMBECTOMY/INFUSION INTRACRANIAL INC DIAG ANGIO  11/29/2016  . LUMBAR LAMINECTOMY  11/2008   Done by Dr. Patrice Paradise  . RADIOLOGY WITH ANESTHESIA N/A 11/29/2016   Procedure: RADIOLOGY WITH ANESTHESIA;  Surgeon: Radiologist, Medication, MD;  Location: [facility];  Service: Radiology;  Laterality: N/A;  . THYROIDECTOMY     Past Medical History:  Diagnosis Date  . Blood transfusion without reported diagnosis   . Chronic low back pain   . Hyperlipidemia   . Osteopenia   . Unspecified essential hypertension   . Unspecified hypothyroidism    BP Marland Kitchen)  142/77   Pulse 68   SpO2 96%   Opioid Risk Score:   Fall Risk Score:  `1  Depression screen PHQ 2/9  Depression screen [facility] 2/9 02/12/2017 01/26/2017 04/07/2016 04/03/2014 11/14/2012  Decreased Interest 0 3 0 0 0  Down, Depressed, Hopeless 1 1 0 0 0  PHQ - 2 Score 1 4 0 0 0  Altered sleeping 1 0 - - -  Tired, decreased energy 0 0 - - -  Change in appetite 0 3 - - -  Feeling bad or failure about yourself  0 0 - - -  Trouble concentrating 0 0 - - -  Moving slowly or fidgety/restless 0 3 - - -  Suicidal thoughts 0 0 - - -  PHQ-9 Score 2 10 - - -  Difficult doing work/chores - Very difficult - - -   Review of Systems  Constitutional: Positive for unexpected weight change.       Loss  HENT: Negative.    Eyes: Negative.   Respiratory: Negative.   Cardiovascular: Negative.   Gastrointestinal: Negative.   Endocrine: Negative.   Genitourinary: Negative.   Musculoskeletal: Positive for gait problem.  Skin: Negative.   Allergic/Immunologic: Negative.   Neurological: Positive for speech difficulty and weakness. Negative for numbness.  Hematological: Negative.   Psychiatric/Behavioral: Positive for confusion. The patient is nervous/anxious.   All other systems reviewed and are negative.      Objective:   Physical Exam  Gen: NAD. Vital signs reviewed. Head: Normocephalic and atraumatic.  Eyes: EOMI. No discharge.  Cardiovascular: RRR.  No JVD. Respiratory: Effort normal and breath sounds normal.  GI: Soft. Bowel sounds are normal.   Musculoskeletal: She exhibits no edema or tenderness.  Neurological: She is alert and oriented.  Right facial weakness.  Predominantly expressive aphasia, improved Motor: RUE/RLE 4+/5 with apraxia (improving) Skin: Skin is warm and dry. She is not diaphoretic.  Psychiatric: She has a normal mood and affect. Her behavior is normal.     Assessment & Plan:  77 y.o. female with history of chronic LBP, CKD, L-MCA infarct s/p revascularization with hemorrhagic transformation with resultant right sided weakness, aphasia and inattention and new diagnosis of AFib presents for hospital follow up for left CVA.     1. Decrease mobility, apraxia, decreased balance, aphasia, ataxia secondary to left CVA.  Cont therapies  Follow up with Neurology, husband believes there is an appointment  Cont Supervision  2. Dysphagia  Now on reg diet, thin liquids.     3. HTN:   Cont meds  Controlled at present  4 Hypokalemia  Pt never had lab work performed after d/c, will order  5. Chronic pain  Denies at present  6. Gait abnormality  Cont cane for safety

## 2017-02-13 NOTE — Telephone Encounter (Signed)
Ok to refill 

## 2017-02-15 ENCOUNTER — Telehealth: Payer: Self-pay | Admitting: *Deleted

## 2017-02-15 DIAGNOSIS — N183 Chronic kidney disease, stage 3 (moderate): Secondary | ICD-10-CM | POA: Diagnosis not present

## 2017-02-15 DIAGNOSIS — I130 Hypertensive heart and chronic kidney disease with heart failure and stage 1 through stage 4 chronic kidney disease, or unspecified chronic kidney disease: Secondary | ICD-10-CM | POA: Diagnosis not present

## 2017-02-15 DIAGNOSIS — I69351 Hemiplegia and hemiparesis following cerebral infarction affecting right dominant side: Secondary | ICD-10-CM | POA: Diagnosis not present

## 2017-02-15 DIAGNOSIS — I4891 Unspecified atrial fibrillation: Secondary | ICD-10-CM | POA: Diagnosis not present

## 2017-02-15 DIAGNOSIS — I5030 Unspecified diastolic (congestive) heart failure: Secondary | ICD-10-CM | POA: Diagnosis not present

## 2017-02-15 DIAGNOSIS — Z9181 History of falling: Secondary | ICD-10-CM | POA: Diagnosis not present

## 2017-02-15 DIAGNOSIS — Z993 Dependence on wheelchair: Secondary | ICD-10-CM | POA: Diagnosis not present

## 2017-02-15 DIAGNOSIS — I69320 Aphasia following cerebral infarction: Secondary | ICD-10-CM | POA: Diagnosis not present

## 2017-02-15 MED ORDER — APIXABAN 5 MG PO TABS: 5.0000 mg | ORAL_TABLET | Freq: Two times a day (BID) | ORAL | 5 refills | Status: DC

## 2017-02-15 NOTE — Telephone Encounter (Signed)
RX as been sent, Pts husband has been notified.

## 2017-02-15 NOTE — Telephone Encounter (Signed)
Patients daughter was calling again to follow up on this request. She asked for a call back from the assistant. She also states her mother is about to be out. Thank you.

## 2017-02-15 NOTE — Telephone Encounter (Signed)
Mickel Baas PT called about Valerie Mcclure's HR being around 28 when she was working with her. Her bp was 120/78 and she was asymptomatic. I talked with Mickel Baas and [name] is on Metoprolol and may have been during a time when it was peaking in her system and will lower HR for a short period. Since she was asymptomatic no concern. Future concerns surrounding low BP or HR should go to primary care or cardiologist.  I will forward to DR Posey Pronto as and FYI.  She was seen in the office on Friday and HR was 68.

## 2017-02-15 NOTE — Telephone Encounter (Signed)
Daughters number is [phone] [name]

## 2017-02-16 DIAGNOSIS — I69351 Hemiplegia and hemiparesis following cerebral infarction affecting right dominant side: Secondary | ICD-10-CM | POA: Diagnosis not present

## 2017-02-16 DIAGNOSIS — I4891 Unspecified atrial fibrillation: Secondary | ICD-10-CM | POA: Diagnosis not present

## 2017-02-16 DIAGNOSIS — I130 Hypertensive heart and chronic kidney disease with heart failure and stage 1 through stage 4 chronic kidney disease, or unspecified chronic kidney disease: Secondary | ICD-10-CM | POA: Diagnosis not present

## 2017-02-16 DIAGNOSIS — I69320 Aphasia following cerebral infarction: Secondary | ICD-10-CM | POA: Diagnosis not present

## 2017-02-16 DIAGNOSIS — I5030 Unspecified diastolic (congestive) heart failure: Secondary | ICD-10-CM | POA: Diagnosis not present

## 2017-02-16 DIAGNOSIS — Z993 Dependence on wheelchair: Secondary | ICD-10-CM | POA: Diagnosis not present

## 2017-02-16 DIAGNOSIS — Z9181 History of falling: Secondary | ICD-10-CM | POA: Diagnosis not present

## 2017-02-16 DIAGNOSIS — N183 Chronic kidney disease, stage 3 (moderate): Secondary | ICD-10-CM | POA: Diagnosis not present

## 2017-02-16 MED ORDER — AMLODIPINE BESYLATE 10 MG PO TABS: 10.0000 mg | ORAL_TABLET | Freq: Every day | ORAL | 1 refills | Status: DC

## 2017-02-16 MED ORDER — ATORVASTATIN CALCIUM 10 MG PO TABS: 5.0000 mg | ORAL_TABLET | Freq: Every day | ORAL | 1 refills | Status: DC

## 2017-02-16 NOTE — Addendum Note (Signed)
Addended by: Earnstine Regal on: 02/16/2017 11:50 AM   Modules accepted: Orders

## 2017-02-16 NOTE — Telephone Encounter (Signed)
Notified pt [facility]...Valerie Mcclure

## 2017-02-16 NOTE — Telephone Encounter (Signed)
Daughter called stating her stroke doctor is not longer going to prescribe  amLODipine (NORVASC) 10 MG tablet  Besylate 10mg  1x a day atorvastatin (LIPITOR) 10 MG tablet  They would like to get this refilled through [facility] on [address]

## 2017-02-17 DIAGNOSIS — I69351 Hemiplegia and hemiparesis following cerebral infarction affecting right dominant side: Secondary | ICD-10-CM | POA: Diagnosis not present

## 2017-02-17 DIAGNOSIS — Z993 Dependence on wheelchair: Secondary | ICD-10-CM | POA: Diagnosis not present

## 2017-02-17 DIAGNOSIS — N183 Chronic kidney disease, stage 3 (moderate): Secondary | ICD-10-CM | POA: Diagnosis not present

## 2017-02-17 DIAGNOSIS — Z9181 History of falling: Secondary | ICD-10-CM | POA: Diagnosis not present

## 2017-02-17 DIAGNOSIS — I130 Hypertensive heart and chronic kidney disease with heart failure and stage 1 through stage 4 chronic kidney disease, or unspecified chronic kidney disease: Secondary | ICD-10-CM | POA: Diagnosis not present

## 2017-02-17 DIAGNOSIS — I5030 Unspecified diastolic (congestive) heart failure: Secondary | ICD-10-CM | POA: Diagnosis not present

## 2017-02-17 DIAGNOSIS — I69320 Aphasia following cerebral infarction: Secondary | ICD-10-CM | POA: Diagnosis not present

## 2017-02-17 DIAGNOSIS — I4891 Unspecified atrial fibrillation: Secondary | ICD-10-CM | POA: Diagnosis not present

## 2017-02-18 DIAGNOSIS — I130 Hypertensive heart and chronic kidney disease with heart failure and stage 1 through stage 4 chronic kidney disease, or unspecified chronic kidney disease: Secondary | ICD-10-CM | POA: Diagnosis not present

## 2017-02-18 DIAGNOSIS — I69320 Aphasia following cerebral infarction: Secondary | ICD-10-CM | POA: Diagnosis not present

## 2017-02-18 DIAGNOSIS — I5030 Unspecified diastolic (congestive) heart failure: Secondary | ICD-10-CM | POA: Diagnosis not present

## 2017-02-18 DIAGNOSIS — Z9181 History of falling: Secondary | ICD-10-CM | POA: Diagnosis not present

## 2017-02-18 DIAGNOSIS — N183 Chronic kidney disease, stage 3 (moderate): Secondary | ICD-10-CM | POA: Diagnosis not present

## 2017-02-18 DIAGNOSIS — I4891 Unspecified atrial fibrillation: Secondary | ICD-10-CM | POA: Diagnosis not present

## 2017-02-18 DIAGNOSIS — I69351 Hemiplegia and hemiparesis following cerebral infarction affecting right dominant side: Secondary | ICD-10-CM | POA: Diagnosis not present

## 2017-02-18 DIAGNOSIS — Z993 Dependence on wheelchair: Secondary | ICD-10-CM | POA: Diagnosis not present

## 2017-02-19 DIAGNOSIS — I69320 Aphasia following cerebral infarction: Secondary | ICD-10-CM | POA: Diagnosis not present

## 2017-02-19 DIAGNOSIS — N183 Chronic kidney disease, stage 3 (moderate): Secondary | ICD-10-CM | POA: Diagnosis not present

## 2017-02-19 DIAGNOSIS — I69351 Hemiplegia and hemiparesis following cerebral infarction affecting right dominant side: Secondary | ICD-10-CM | POA: Diagnosis not present

## 2017-02-19 DIAGNOSIS — I130 Hypertensive heart and chronic kidney disease with heart failure and stage 1 through stage 4 chronic kidney disease, or unspecified chronic kidney disease: Secondary | ICD-10-CM | POA: Diagnosis not present

## 2017-02-19 DIAGNOSIS — I4891 Unspecified atrial fibrillation: Secondary | ICD-10-CM | POA: Diagnosis not present

## 2017-02-19 DIAGNOSIS — Z9181 History of falling: Secondary | ICD-10-CM | POA: Diagnosis not present

## 2017-02-19 DIAGNOSIS — I5030 Unspecified diastolic (congestive) heart failure: Secondary | ICD-10-CM | POA: Diagnosis not present

## 2017-02-19 DIAGNOSIS — Z993 Dependence on wheelchair: Secondary | ICD-10-CM | POA: Diagnosis not present

## 2017-02-22 DIAGNOSIS — I69351 Hemiplegia and hemiparesis following cerebral infarction affecting right dominant side: Secondary | ICD-10-CM | POA: Diagnosis not present

## 2017-02-22 DIAGNOSIS — N183 Chronic kidney disease, stage 3 (moderate): Secondary | ICD-10-CM | POA: Diagnosis not present

## 2017-02-22 DIAGNOSIS — I4891 Unspecified atrial fibrillation: Secondary | ICD-10-CM | POA: Diagnosis not present

## 2017-02-22 DIAGNOSIS — Z993 Dependence on wheelchair: Secondary | ICD-10-CM | POA: Diagnosis not present

## 2017-02-22 DIAGNOSIS — I5030 Unspecified diastolic (congestive) heart failure: Secondary | ICD-10-CM | POA: Diagnosis not present

## 2017-02-22 DIAGNOSIS — I130 Hypertensive heart and chronic kidney disease with heart failure and stage 1 through stage 4 chronic kidney disease, or unspecified chronic kidney disease: Secondary | ICD-10-CM | POA: Diagnosis not present

## 2017-02-22 DIAGNOSIS — Z9181 History of falling: Secondary | ICD-10-CM | POA: Diagnosis not present

## 2017-02-22 DIAGNOSIS — I69320 Aphasia following cerebral infarction: Secondary | ICD-10-CM | POA: Diagnosis not present

## 2017-02-23 DIAGNOSIS — I5030 Unspecified diastolic (congestive) heart failure: Secondary | ICD-10-CM | POA: Diagnosis not present

## 2017-02-23 DIAGNOSIS — Z9181 History of falling: Secondary | ICD-10-CM | POA: Diagnosis not present

## 2017-02-23 DIAGNOSIS — N183 Chronic kidney disease, stage 3 (moderate): Secondary | ICD-10-CM | POA: Diagnosis not present

## 2017-02-23 DIAGNOSIS — I4891 Unspecified atrial fibrillation: Secondary | ICD-10-CM | POA: Diagnosis not present

## 2017-02-23 DIAGNOSIS — I69351 Hemiplegia and hemiparesis following cerebral infarction affecting right dominant side: Secondary | ICD-10-CM | POA: Diagnosis not present

## 2017-02-23 DIAGNOSIS — Z993 Dependence on wheelchair: Secondary | ICD-10-CM | POA: Diagnosis not present

## 2017-02-23 DIAGNOSIS — I130 Hypertensive heart and chronic kidney disease with heart failure and stage 1 through stage 4 chronic kidney disease, or unspecified chronic kidney disease: Secondary | ICD-10-CM | POA: Diagnosis not present

## 2017-02-23 DIAGNOSIS — I69320 Aphasia following cerebral infarction: Secondary | ICD-10-CM | POA: Diagnosis not present

## 2017-02-24 ENCOUNTER — Other Ambulatory Visit: Payer: Self-pay

## 2017-02-24 DIAGNOSIS — Z9181 History of falling: Secondary | ICD-10-CM | POA: Diagnosis not present

## 2017-02-24 DIAGNOSIS — I69351 Hemiplegia and hemiparesis following cerebral infarction affecting right dominant side: Secondary | ICD-10-CM | POA: Diagnosis not present

## 2017-02-24 DIAGNOSIS — I4891 Unspecified atrial fibrillation: Secondary | ICD-10-CM | POA: Diagnosis not present

## 2017-02-24 DIAGNOSIS — I69320 Aphasia following cerebral infarction: Secondary | ICD-10-CM | POA: Diagnosis not present

## 2017-02-24 DIAGNOSIS — I130 Hypertensive heart and chronic kidney disease with heart failure and stage 1 through stage 4 chronic kidney disease, or unspecified chronic kidney disease: Secondary | ICD-10-CM | POA: Diagnosis not present

## 2017-02-24 DIAGNOSIS — N183 Chronic kidney disease, stage 3 (moderate): Secondary | ICD-10-CM | POA: Diagnosis not present

## 2017-02-24 DIAGNOSIS — I5030 Unspecified diastolic (congestive) heart failure: Secondary | ICD-10-CM | POA: Diagnosis not present

## 2017-02-24 DIAGNOSIS — Z993 Dependence on wheelchair: Secondary | ICD-10-CM | POA: Diagnosis not present

## 2017-02-24 NOTE — Patient Outreach (Signed)
     Unsuccessful attempt made to contact patient via telephone for community care coordination and to assess for patient's progression in meeting her case management goals.  Plan: Make telephone contact with patient in the next 28 days to assess patient's progress in meeting her case management goals

## 2017-02-25 ENCOUNTER — Ambulatory Visit: Payer: Self-pay

## 2017-02-25 DIAGNOSIS — I4891 Unspecified atrial fibrillation: Secondary | ICD-10-CM | POA: Diagnosis not present

## 2017-02-25 DIAGNOSIS — Z993 Dependence on wheelchair: Secondary | ICD-10-CM | POA: Diagnosis not present

## 2017-02-25 DIAGNOSIS — N183 Chronic kidney disease, stage 3 (moderate): Secondary | ICD-10-CM | POA: Diagnosis not present

## 2017-02-25 DIAGNOSIS — I130 Hypertensive heart and chronic kidney disease with heart failure and stage 1 through stage 4 chronic kidney disease, or unspecified chronic kidney disease: Secondary | ICD-10-CM | POA: Diagnosis not present

## 2017-02-25 DIAGNOSIS — Z9181 History of falling: Secondary | ICD-10-CM | POA: Diagnosis not present

## 2017-02-25 DIAGNOSIS — I69351 Hemiplegia and hemiparesis following cerebral infarction affecting right dominant side: Secondary | ICD-10-CM | POA: Diagnosis not present

## 2017-02-25 DIAGNOSIS — I5030 Unspecified diastolic (congestive) heart failure: Secondary | ICD-10-CM | POA: Diagnosis not present

## 2017-02-25 DIAGNOSIS — I69320 Aphasia following cerebral infarction: Secondary | ICD-10-CM | POA: Diagnosis not present

## 2017-03-01 DIAGNOSIS — Z993 Dependence on wheelchair: Secondary | ICD-10-CM | POA: Diagnosis not present

## 2017-03-01 DIAGNOSIS — I4891 Unspecified atrial fibrillation: Secondary | ICD-10-CM | POA: Diagnosis not present

## 2017-03-01 DIAGNOSIS — I130 Hypertensive heart and chronic kidney disease with heart failure and stage 1 through stage 4 chronic kidney disease, or unspecified chronic kidney disease: Secondary | ICD-10-CM | POA: Diagnosis not present

## 2017-03-01 DIAGNOSIS — I69351 Hemiplegia and hemiparesis following cerebral infarction affecting right dominant side: Secondary | ICD-10-CM | POA: Diagnosis not present

## 2017-03-01 DIAGNOSIS — I5030 Unspecified diastolic (congestive) heart failure: Secondary | ICD-10-CM | POA: Diagnosis not present

## 2017-03-01 DIAGNOSIS — I69320 Aphasia following cerebral infarction: Secondary | ICD-10-CM | POA: Diagnosis not present

## 2017-03-01 DIAGNOSIS — N183 Chronic kidney disease, stage 3 (moderate): Secondary | ICD-10-CM | POA: Diagnosis not present

## 2017-03-01 DIAGNOSIS — I129 Hypertensive chronic kidney disease with stage 1 through stage 4 chronic kidney disease, or unspecified chronic kidney disease: Secondary | ICD-10-CM | POA: Diagnosis not present

## 2017-03-01 DIAGNOSIS — Z9181 History of falling: Secondary | ICD-10-CM | POA: Diagnosis not present

## 2017-03-01 DIAGNOSIS — Z8673 Personal history of transient ischemic attack (TIA), and cerebral infarction without residual deficits: Secondary | ICD-10-CM | POA: Diagnosis not present

## 2017-03-02 DIAGNOSIS — I69320 Aphasia following cerebral infarction: Secondary | ICD-10-CM | POA: Diagnosis not present

## 2017-03-02 DIAGNOSIS — Z9181 History of falling: Secondary | ICD-10-CM | POA: Diagnosis not present

## 2017-03-02 DIAGNOSIS — I4891 Unspecified atrial fibrillation: Secondary | ICD-10-CM | POA: Diagnosis not present

## 2017-03-02 DIAGNOSIS — Z993 Dependence on wheelchair: Secondary | ICD-10-CM | POA: Diagnosis not present

## 2017-03-02 DIAGNOSIS — I5030 Unspecified diastolic (congestive) heart failure: Secondary | ICD-10-CM | POA: Diagnosis not present

## 2017-03-02 DIAGNOSIS — N183 Chronic kidney disease, stage 3 (moderate): Secondary | ICD-10-CM | POA: Diagnosis not present

## 2017-03-02 DIAGNOSIS — I69351 Hemiplegia and hemiparesis following cerebral infarction affecting right dominant side: Secondary | ICD-10-CM | POA: Diagnosis not present

## 2017-03-02 DIAGNOSIS — I130 Hypertensive heart and chronic kidney disease with heart failure and stage 1 through stage 4 chronic kidney disease, or unspecified chronic kidney disease: Secondary | ICD-10-CM | POA: Diagnosis not present

## 2017-03-03 ENCOUNTER — Telehealth: Payer: Self-pay | Admitting: Internal Medicine

## 2017-03-03 DIAGNOSIS — N183 Chronic kidney disease, stage 3 (moderate): Secondary | ICD-10-CM | POA: Diagnosis not present

## 2017-03-03 DIAGNOSIS — I130 Hypertensive heart and chronic kidney disease with heart failure and stage 1 through stage 4 chronic kidney disease, or unspecified chronic kidney disease: Secondary | ICD-10-CM | POA: Diagnosis not present

## 2017-03-03 DIAGNOSIS — Z9181 History of falling: Secondary | ICD-10-CM | POA: Diagnosis not present

## 2017-03-03 DIAGNOSIS — Z993 Dependence on wheelchair: Secondary | ICD-10-CM | POA: Diagnosis not present

## 2017-03-03 DIAGNOSIS — I5030 Unspecified diastolic (congestive) heart failure: Secondary | ICD-10-CM | POA: Diagnosis not present

## 2017-03-03 DIAGNOSIS — I4891 Unspecified atrial fibrillation: Secondary | ICD-10-CM | POA: Diagnosis not present

## 2017-03-03 DIAGNOSIS — I69351 Hemiplegia and hemiparesis following cerebral infarction affecting right dominant side: Secondary | ICD-10-CM | POA: Diagnosis not present

## 2017-03-03 DIAGNOSIS — I69320 Aphasia following cerebral infarction: Secondary | ICD-10-CM | POA: Diagnosis not present

## 2017-03-03 MED ORDER — POTASSIUM CHLORIDE CRYS ER 20 MEQ PO TBCR: 20.0000 meq | EXTENDED_RELEASE_TABLET | Freq: Once | ORAL | 1 refills | Status: DC

## 2017-03-03 MED ORDER — ATORVASTATIN CALCIUM 10 MG PO TABS: 5.0000 mg | ORAL_TABLET | Freq: Every day | ORAL | 1 refills | Status: DC

## 2017-03-03 NOTE — Telephone Encounter (Signed)
Reviewed chart pt is up-to-date sent notified pt/daughter [facility]ills [facility].Marland KitchenJohny Chess

## 2017-03-03 NOTE — Telephone Encounter (Signed)
Patient's daughter called requesting potassium chloride and atorvastatin.  [facility] at [facility].  States one medication was prescribed at the hospital but has had a hospital follow up with [facility].

## 2017-03-08 ENCOUNTER — Other Ambulatory Visit (HOSPITAL_COMMUNITY): Payer: Self-pay | Admitting: Interventional Radiology

## 2017-03-08 ENCOUNTER — Telehealth: Payer: Self-pay | Admitting: Internal Medicine

## 2017-03-08 DIAGNOSIS — I69351 Hemiplegia and hemiparesis following cerebral infarction affecting right dominant side: Secondary | ICD-10-CM | POA: Diagnosis not present

## 2017-03-08 DIAGNOSIS — R1310 Dysphagia, unspecified: Secondary | ICD-10-CM | POA: Diagnosis not present

## 2017-03-08 DIAGNOSIS — I130 Hypertensive heart and chronic kidney disease with heart failure and stage 1 through stage 4 chronic kidney disease, or unspecified chronic kidney disease: Secondary | ICD-10-CM | POA: Diagnosis not present

## 2017-03-08 DIAGNOSIS — N183 Chronic kidney disease, stage 3 (moderate): Secondary | ICD-10-CM | POA: Diagnosis not present

## 2017-03-08 DIAGNOSIS — I639 Cerebral infarction, unspecified: Secondary | ICD-10-CM

## 2017-03-08 DIAGNOSIS — D631 Anemia in chronic kidney disease: Secondary | ICD-10-CM | POA: Diagnosis not present

## 2017-03-08 DIAGNOSIS — I69391 Dysphagia following cerebral infarction: Secondary | ICD-10-CM | POA: Diagnosis not present

## 2017-03-08 DIAGNOSIS — I69390 Apraxia following cerebral infarction: Secondary | ICD-10-CM | POA: Diagnosis not present

## 2017-03-08 DIAGNOSIS — Z7901 Long term (current) use of anticoagulants: Secondary | ICD-10-CM | POA: Diagnosis not present

## 2017-03-08 DIAGNOSIS — I6330 Cerebral infarction due to thrombosis of unspecified cerebral artery: Secondary | ICD-10-CM | POA: Diagnosis not present

## 2017-03-08 DIAGNOSIS — I69320 Aphasia following cerebral infarction: Secondary | ICD-10-CM | POA: Diagnosis not present

## 2017-03-08 DIAGNOSIS — Z993 Dependence on wheelchair: Secondary | ICD-10-CM | POA: Diagnosis not present

## 2017-03-08 DIAGNOSIS — R7303 Prediabetes: Secondary | ICD-10-CM | POA: Diagnosis not present

## 2017-03-08 DIAGNOSIS — I4891 Unspecified atrial fibrillation: Secondary | ICD-10-CM | POA: Diagnosis not present

## 2017-03-08 DIAGNOSIS — I5030 Unspecified diastolic (congestive) heart failure: Secondary | ICD-10-CM | POA: Diagnosis not present

## 2017-03-08 NOTE — Telephone Encounter (Signed)
Requesting order to recert pt for speech therapy for two times a week for one week and three times a week for four weeks.

## 2017-03-08 NOTE — Telephone Encounter (Signed)
Requesting verbals to extend Physical therapy for 2week 4

## 2017-03-09 ENCOUNTER — Other Ambulatory Visit: Payer: Self-pay | Admitting: Pharmacist

## 2017-03-09 ENCOUNTER — Telehealth: Payer: Self-pay | Admitting: Internal Medicine

## 2017-03-09 DIAGNOSIS — Z993 Dependence on wheelchair: Secondary | ICD-10-CM | POA: Diagnosis not present

## 2017-03-09 DIAGNOSIS — R7303 Prediabetes: Secondary | ICD-10-CM | POA: Diagnosis not present

## 2017-03-09 DIAGNOSIS — I69351 Hemiplegia and hemiparesis following cerebral infarction affecting right dominant side: Secondary | ICD-10-CM | POA: Diagnosis not present

## 2017-03-09 DIAGNOSIS — D631 Anemia in chronic kidney disease: Secondary | ICD-10-CM | POA: Diagnosis not present

## 2017-03-09 DIAGNOSIS — I69320 Aphasia following cerebral infarction: Secondary | ICD-10-CM | POA: Diagnosis not present

## 2017-03-09 DIAGNOSIS — Z7901 Long term (current) use of anticoagulants: Secondary | ICD-10-CM | POA: Diagnosis not present

## 2017-03-09 DIAGNOSIS — I69391 Dysphagia following cerebral infarction: Secondary | ICD-10-CM | POA: Diagnosis not present

## 2017-03-09 DIAGNOSIS — I5030 Unspecified diastolic (congestive) heart failure: Secondary | ICD-10-CM | POA: Diagnosis not present

## 2017-03-09 DIAGNOSIS — I130 Hypertensive heart and chronic kidney disease with heart failure and stage 1 through stage 4 chronic kidney disease, or unspecified chronic kidney disease: Secondary | ICD-10-CM | POA: Diagnosis not present

## 2017-03-09 DIAGNOSIS — I69390 Apraxia following cerebral infarction: Secondary | ICD-10-CM | POA: Diagnosis not present

## 2017-03-09 DIAGNOSIS — N183 Chronic kidney disease, stage 3 (moderate): Secondary | ICD-10-CM | POA: Diagnosis not present

## 2017-03-09 DIAGNOSIS — I4891 Unspecified atrial fibrillation: Secondary | ICD-10-CM | POA: Diagnosis not present

## 2017-03-09 DIAGNOSIS — R1310 Dysphagia, unspecified: Secondary | ICD-10-CM | POA: Diagnosis not present

## 2017-03-09 NOTE — Telephone Encounter (Signed)
LVM giving verbal orders per MD

## 2017-03-09 NOTE — Telephone Encounter (Signed)
LVM with Flor giving verbal orders per MD.    CB Number: [phone]

## 2017-03-09 NOTE — Telephone Encounter (Signed)
LVM with Clair Gulling to give verbal orders per MD

## 2017-03-09 NOTE — Patient Outreach (Signed)
[facility]) Care Management  03/09/2017  Valerie Mcclure [birth year] [phone]  Successful phone outreach to patient's daughter, Valerie Mcclure, on [facility] Consent form.    Daughter reports since patient has followed-up with PCP they have not had questions or medication needs.  She reports at this time they are not going to look into mail order pharmacy for medication cost savings---they understand this in an option patient's insurance plan offers.    Daughter denies patient having further pharmacy related needs at this time.   Plan:  Will close pharmacy case.    Will update [facility] RN Pam of pharmacy case closure.   Karrie Meres, PharmD, [place] [phone]

## 2017-03-09 NOTE — Telephone Encounter (Signed)
Verbal Orders for OT  1x a week 2x a week  Also think Neuro Rehab needs to begin for PT, OT and Speech therapy.  Jim [phone]

## 2017-03-10 ENCOUNTER — Encounter: Payer: Self-pay | Admitting: Emergency Medicine

## 2017-03-10 ENCOUNTER — Telehealth: Payer: Self-pay | Admitting: Internal Medicine

## 2017-03-10 ENCOUNTER — Other Ambulatory Visit: Payer: Self-pay

## 2017-03-10 DIAGNOSIS — N183 Chronic kidney disease, stage 3 (moderate): Secondary | ICD-10-CM | POA: Diagnosis not present

## 2017-03-10 DIAGNOSIS — R1310 Dysphagia, unspecified: Secondary | ICD-10-CM | POA: Diagnosis not present

## 2017-03-10 DIAGNOSIS — Z7901 Long term (current) use of anticoagulants: Secondary | ICD-10-CM | POA: Diagnosis not present

## 2017-03-10 DIAGNOSIS — I5030 Unspecified diastolic (congestive) heart failure: Secondary | ICD-10-CM | POA: Diagnosis not present

## 2017-03-10 DIAGNOSIS — I4891 Unspecified atrial fibrillation: Secondary | ICD-10-CM | POA: Diagnosis not present

## 2017-03-10 DIAGNOSIS — Z993 Dependence on wheelchair: Secondary | ICD-10-CM | POA: Diagnosis not present

## 2017-03-10 DIAGNOSIS — D631 Anemia in chronic kidney disease: Secondary | ICD-10-CM | POA: Diagnosis not present

## 2017-03-10 DIAGNOSIS — I69320 Aphasia following cerebral infarction: Secondary | ICD-10-CM | POA: Diagnosis not present

## 2017-03-10 DIAGNOSIS — I69391 Dysphagia following cerebral infarction: Secondary | ICD-10-CM | POA: Diagnosis not present

## 2017-03-10 DIAGNOSIS — I130 Hypertensive heart and chronic kidney disease with heart failure and stage 1 through stage 4 chronic kidney disease, or unspecified chronic kidney disease: Secondary | ICD-10-CM | POA: Diagnosis not present

## 2017-03-10 DIAGNOSIS — I69390 Apraxia following cerebral infarction: Secondary | ICD-10-CM | POA: Diagnosis not present

## 2017-03-10 DIAGNOSIS — R7303 Prediabetes: Secondary | ICD-10-CM | POA: Diagnosis not present

## 2017-03-10 DIAGNOSIS — I69351 Hemiplegia and hemiparesis following cerebral infarction affecting right dominant side: Secondary | ICD-10-CM | POA: Diagnosis not present

## 2017-03-10 NOTE — Telephone Encounter (Signed)
Please advise and I will write letter.

## 2017-03-10 NOTE — Telephone Encounter (Signed)
Pt daughter called stating the Pt was summoned to [employer] for [employer] on sept 12th. And cannot make it due to her having 2 strokes. They would like a letter excluding her from this.  Please advise.

## 2017-03-10 NOTE — Telephone Encounter (Signed)
Ok to write letter - she is unable to serve on jury due to her current medical conditions, including history of 2 strokes.

## 2017-03-10 NOTE — Telephone Encounter (Signed)
Spoke with pts daughter. Letter has been mailed to pt at daughters request.

## 2017-03-11 ENCOUNTER — Telehealth: Payer: Self-pay | Admitting: Internal Medicine

## 2017-03-11 DIAGNOSIS — N183 Chronic kidney disease, stage 3 (moderate): Secondary | ICD-10-CM | POA: Diagnosis not present

## 2017-03-11 DIAGNOSIS — I69391 Dysphagia following cerebral infarction: Secondary | ICD-10-CM | POA: Diagnosis not present

## 2017-03-11 DIAGNOSIS — R1310 Dysphagia, unspecified: Secondary | ICD-10-CM | POA: Diagnosis not present

## 2017-03-11 DIAGNOSIS — I4891 Unspecified atrial fibrillation: Secondary | ICD-10-CM | POA: Diagnosis not present

## 2017-03-11 DIAGNOSIS — I130 Hypertensive heart and chronic kidney disease with heart failure and stage 1 through stage 4 chronic kidney disease, or unspecified chronic kidney disease: Secondary | ICD-10-CM | POA: Diagnosis not present

## 2017-03-11 DIAGNOSIS — Z7901 Long term (current) use of anticoagulants: Secondary | ICD-10-CM | POA: Diagnosis not present

## 2017-03-11 DIAGNOSIS — I5030 Unspecified diastolic (congestive) heart failure: Secondary | ICD-10-CM | POA: Diagnosis not present

## 2017-03-11 DIAGNOSIS — I69351 Hemiplegia and hemiparesis following cerebral infarction affecting right dominant side: Secondary | ICD-10-CM | POA: Diagnosis not present

## 2017-03-11 DIAGNOSIS — Z993 Dependence on wheelchair: Secondary | ICD-10-CM | POA: Diagnosis not present

## 2017-03-11 DIAGNOSIS — I69320 Aphasia following cerebral infarction: Secondary | ICD-10-CM | POA: Diagnosis not present

## 2017-03-11 DIAGNOSIS — D631 Anemia in chronic kidney disease: Secondary | ICD-10-CM | POA: Diagnosis not present

## 2017-03-11 DIAGNOSIS — R7303 Prediabetes: Secondary | ICD-10-CM | POA: Diagnosis not present

## 2017-03-11 DIAGNOSIS — I69390 Apraxia following cerebral infarction: Secondary | ICD-10-CM | POA: Diagnosis not present

## 2017-03-11 MED ORDER — TRAMADOL HCL 50 MG PO TABS: 50.0000 mg | ORAL_TABLET | Freq: Three times a day (TID) | ORAL | 0 refills | Status: DC | PRN

## 2017-03-11 NOTE — Telephone Encounter (Signed)
The pts daughter called stating that the physical therapist was just out with the pt. She is starting to have back pain again. Her physical therapist requested that they ask for a prescription for tramadol to help with the pain. Please advise.

## 2017-03-11 NOTE — Telephone Encounter (Signed)
Ok printed

## 2017-03-11 NOTE — Telephone Encounter (Signed)
RX faxed to [facility]. Notified pts daughter

## 2017-03-12 DIAGNOSIS — I69391 Dysphagia following cerebral infarction: Secondary | ICD-10-CM | POA: Diagnosis not present

## 2017-03-12 DIAGNOSIS — I69390 Apraxia following cerebral infarction: Secondary | ICD-10-CM | POA: Diagnosis not present

## 2017-03-12 DIAGNOSIS — I69351 Hemiplegia and hemiparesis following cerebral infarction affecting right dominant side: Secondary | ICD-10-CM | POA: Diagnosis not present

## 2017-03-12 DIAGNOSIS — R1310 Dysphagia, unspecified: Secondary | ICD-10-CM | POA: Diagnosis not present

## 2017-03-12 DIAGNOSIS — I130 Hypertensive heart and chronic kidney disease with heart failure and stage 1 through stage 4 chronic kidney disease, or unspecified chronic kidney disease: Secondary | ICD-10-CM | POA: Diagnosis not present

## 2017-03-12 DIAGNOSIS — Z993 Dependence on wheelchair: Secondary | ICD-10-CM | POA: Diagnosis not present

## 2017-03-12 DIAGNOSIS — Z7901 Long term (current) use of anticoagulants: Secondary | ICD-10-CM | POA: Diagnosis not present

## 2017-03-12 DIAGNOSIS — N183 Chronic kidney disease, stage 3 (moderate): Secondary | ICD-10-CM | POA: Diagnosis not present

## 2017-03-12 DIAGNOSIS — I69320 Aphasia following cerebral infarction: Secondary | ICD-10-CM | POA: Diagnosis not present

## 2017-03-12 DIAGNOSIS — I4891 Unspecified atrial fibrillation: Secondary | ICD-10-CM | POA: Diagnosis not present

## 2017-03-12 DIAGNOSIS — D631 Anemia in chronic kidney disease: Secondary | ICD-10-CM | POA: Diagnosis not present

## 2017-03-12 DIAGNOSIS — R7303 Prediabetes: Secondary | ICD-10-CM | POA: Diagnosis not present

## 2017-03-12 DIAGNOSIS — I5030 Unspecified diastolic (congestive) heart failure: Secondary | ICD-10-CM | POA: Diagnosis not present

## 2017-03-15 DIAGNOSIS — I69390 Apraxia following cerebral infarction: Secondary | ICD-10-CM | POA: Diagnosis not present

## 2017-03-15 DIAGNOSIS — R1310 Dysphagia, unspecified: Secondary | ICD-10-CM | POA: Diagnosis not present

## 2017-03-15 DIAGNOSIS — R7303 Prediabetes: Secondary | ICD-10-CM | POA: Diagnosis not present

## 2017-03-15 DIAGNOSIS — D631 Anemia in chronic kidney disease: Secondary | ICD-10-CM | POA: Diagnosis not present

## 2017-03-15 DIAGNOSIS — I4891 Unspecified atrial fibrillation: Secondary | ICD-10-CM | POA: Diagnosis not present

## 2017-03-15 DIAGNOSIS — I5030 Unspecified diastolic (congestive) heart failure: Secondary | ICD-10-CM | POA: Diagnosis not present

## 2017-03-15 DIAGNOSIS — I130 Hypertensive heart and chronic kidney disease with heart failure and stage 1 through stage 4 chronic kidney disease, or unspecified chronic kidney disease: Secondary | ICD-10-CM | POA: Diagnosis not present

## 2017-03-15 DIAGNOSIS — Z7901 Long term (current) use of anticoagulants: Secondary | ICD-10-CM | POA: Diagnosis not present

## 2017-03-15 DIAGNOSIS — I69391 Dysphagia following cerebral infarction: Secondary | ICD-10-CM | POA: Diagnosis not present

## 2017-03-15 DIAGNOSIS — N183 Chronic kidney disease, stage 3 (moderate): Secondary | ICD-10-CM | POA: Diagnosis not present

## 2017-03-15 DIAGNOSIS — I69320 Aphasia following cerebral infarction: Secondary | ICD-10-CM | POA: Diagnosis not present

## 2017-03-15 DIAGNOSIS — Z993 Dependence on wheelchair: Secondary | ICD-10-CM | POA: Diagnosis not present

## 2017-03-15 DIAGNOSIS — I69351 Hemiplegia and hemiparesis following cerebral infarction affecting right dominant side: Secondary | ICD-10-CM | POA: Diagnosis not present

## 2017-03-16 ENCOUNTER — Other Ambulatory Visit: Payer: Self-pay

## 2017-03-16 DIAGNOSIS — R7303 Prediabetes: Secondary | ICD-10-CM | POA: Diagnosis not present

## 2017-03-16 DIAGNOSIS — I4891 Unspecified atrial fibrillation: Secondary | ICD-10-CM | POA: Diagnosis not present

## 2017-03-16 DIAGNOSIS — I69320 Aphasia following cerebral infarction: Secondary | ICD-10-CM | POA: Diagnosis not present

## 2017-03-16 DIAGNOSIS — I5030 Unspecified diastolic (congestive) heart failure: Secondary | ICD-10-CM | POA: Diagnosis not present

## 2017-03-16 DIAGNOSIS — I69351 Hemiplegia and hemiparesis following cerebral infarction affecting right dominant side: Secondary | ICD-10-CM | POA: Diagnosis not present

## 2017-03-16 DIAGNOSIS — R1310 Dysphagia, unspecified: Secondary | ICD-10-CM | POA: Diagnosis not present

## 2017-03-16 DIAGNOSIS — N183 Chronic kidney disease, stage 3 (moderate): Secondary | ICD-10-CM | POA: Diagnosis not present

## 2017-03-16 DIAGNOSIS — Z7901 Long term (current) use of anticoagulants: Secondary | ICD-10-CM | POA: Diagnosis not present

## 2017-03-16 DIAGNOSIS — Z993 Dependence on wheelchair: Secondary | ICD-10-CM | POA: Diagnosis not present

## 2017-03-16 DIAGNOSIS — I69391 Dysphagia following cerebral infarction: Secondary | ICD-10-CM | POA: Diagnosis not present

## 2017-03-16 DIAGNOSIS — D631 Anemia in chronic kidney disease: Secondary | ICD-10-CM | POA: Diagnosis not present

## 2017-03-16 DIAGNOSIS — I130 Hypertensive heart and chronic kidney disease with heart failure and stage 1 through stage 4 chronic kidney disease, or unspecified chronic kidney disease: Secondary | ICD-10-CM | POA: Diagnosis not present

## 2017-03-16 DIAGNOSIS — I69390 Apraxia following cerebral infarction: Secondary | ICD-10-CM | POA: Diagnosis not present

## 2017-03-16 NOTE — Patient Outreach (Signed)
Telephone outreach to patient to obtain mRs was successfully completed. mRs= 5. 

## 2017-03-17 DIAGNOSIS — I130 Hypertensive heart and chronic kidney disease with heart failure and stage 1 through stage 4 chronic kidney disease, or unspecified chronic kidney disease: Secondary | ICD-10-CM | POA: Diagnosis not present

## 2017-03-17 DIAGNOSIS — I69320 Aphasia following cerebral infarction: Secondary | ICD-10-CM | POA: Diagnosis not present

## 2017-03-17 DIAGNOSIS — I5030 Unspecified diastolic (congestive) heart failure: Secondary | ICD-10-CM | POA: Diagnosis not present

## 2017-03-17 DIAGNOSIS — I69391 Dysphagia following cerebral infarction: Secondary | ICD-10-CM | POA: Diagnosis not present

## 2017-03-17 DIAGNOSIS — N183 Chronic kidney disease, stage 3 (moderate): Secondary | ICD-10-CM | POA: Diagnosis not present

## 2017-03-17 DIAGNOSIS — R1310 Dysphagia, unspecified: Secondary | ICD-10-CM | POA: Diagnosis not present

## 2017-03-17 DIAGNOSIS — I69390 Apraxia following cerebral infarction: Secondary | ICD-10-CM | POA: Diagnosis not present

## 2017-03-17 DIAGNOSIS — Z7901 Long term (current) use of anticoagulants: Secondary | ICD-10-CM | POA: Diagnosis not present

## 2017-03-17 DIAGNOSIS — I69351 Hemiplegia and hemiparesis following cerebral infarction affecting right dominant side: Secondary | ICD-10-CM | POA: Diagnosis not present

## 2017-03-17 DIAGNOSIS — R7303 Prediabetes: Secondary | ICD-10-CM | POA: Diagnosis not present

## 2017-03-17 DIAGNOSIS — D631 Anemia in chronic kidney disease: Secondary | ICD-10-CM | POA: Diagnosis not present

## 2017-03-17 DIAGNOSIS — Z993 Dependence on wheelchair: Secondary | ICD-10-CM | POA: Diagnosis not present

## 2017-03-17 DIAGNOSIS — I4891 Unspecified atrial fibrillation: Secondary | ICD-10-CM | POA: Diagnosis not present

## 2017-03-18 ENCOUNTER — Ambulatory Visit: Payer: Self-pay | Admitting: Neurology

## 2017-03-18 DIAGNOSIS — R7303 Prediabetes: Secondary | ICD-10-CM | POA: Diagnosis not present

## 2017-03-18 DIAGNOSIS — Z7901 Long term (current) use of anticoagulants: Secondary | ICD-10-CM | POA: Diagnosis not present

## 2017-03-18 DIAGNOSIS — I69351 Hemiplegia and hemiparesis following cerebral infarction affecting right dominant side: Secondary | ICD-10-CM | POA: Diagnosis not present

## 2017-03-18 DIAGNOSIS — I4891 Unspecified atrial fibrillation: Secondary | ICD-10-CM | POA: Diagnosis not present

## 2017-03-18 DIAGNOSIS — I130 Hypertensive heart and chronic kidney disease with heart failure and stage 1 through stage 4 chronic kidney disease, or unspecified chronic kidney disease: Secondary | ICD-10-CM | POA: Diagnosis not present

## 2017-03-18 DIAGNOSIS — I5030 Unspecified diastolic (congestive) heart failure: Secondary | ICD-10-CM | POA: Diagnosis not present

## 2017-03-18 DIAGNOSIS — Z993 Dependence on wheelchair: Secondary | ICD-10-CM | POA: Diagnosis not present

## 2017-03-18 DIAGNOSIS — I69320 Aphasia following cerebral infarction: Secondary | ICD-10-CM | POA: Diagnosis not present

## 2017-03-18 DIAGNOSIS — N183 Chronic kidney disease, stage 3 (moderate): Secondary | ICD-10-CM | POA: Diagnosis not present

## 2017-03-18 DIAGNOSIS — I69391 Dysphagia following cerebral infarction: Secondary | ICD-10-CM | POA: Diagnosis not present

## 2017-03-18 DIAGNOSIS — R1310 Dysphagia, unspecified: Secondary | ICD-10-CM | POA: Diagnosis not present

## 2017-03-18 DIAGNOSIS — I69390 Apraxia following cerebral infarction: Secondary | ICD-10-CM | POA: Diagnosis not present

## 2017-03-18 DIAGNOSIS — D631 Anemia in chronic kidney disease: Secondary | ICD-10-CM | POA: Diagnosis not present

## 2017-03-23 DIAGNOSIS — I69391 Dysphagia following cerebral infarction: Secondary | ICD-10-CM | POA: Diagnosis not present

## 2017-03-23 DIAGNOSIS — I4891 Unspecified atrial fibrillation: Secondary | ICD-10-CM | POA: Diagnosis not present

## 2017-03-23 DIAGNOSIS — I130 Hypertensive heart and chronic kidney disease with heart failure and stage 1 through stage 4 chronic kidney disease, or unspecified chronic kidney disease: Secondary | ICD-10-CM | POA: Diagnosis not present

## 2017-03-23 DIAGNOSIS — I69390 Apraxia following cerebral infarction: Secondary | ICD-10-CM | POA: Diagnosis not present

## 2017-03-23 DIAGNOSIS — Z7901 Long term (current) use of anticoagulants: Secondary | ICD-10-CM | POA: Diagnosis not present

## 2017-03-23 DIAGNOSIS — I69320 Aphasia following cerebral infarction: Secondary | ICD-10-CM | POA: Diagnosis not present

## 2017-03-23 DIAGNOSIS — R1310 Dysphagia, unspecified: Secondary | ICD-10-CM | POA: Diagnosis not present

## 2017-03-23 DIAGNOSIS — D631 Anemia in chronic kidney disease: Secondary | ICD-10-CM | POA: Diagnosis not present

## 2017-03-23 DIAGNOSIS — R7303 Prediabetes: Secondary | ICD-10-CM | POA: Diagnosis not present

## 2017-03-23 DIAGNOSIS — Z993 Dependence on wheelchair: Secondary | ICD-10-CM | POA: Diagnosis not present

## 2017-03-23 DIAGNOSIS — I69351 Hemiplegia and hemiparesis following cerebral infarction affecting right dominant side: Secondary | ICD-10-CM | POA: Diagnosis not present

## 2017-03-23 DIAGNOSIS — N183 Chronic kidney disease, stage 3 (moderate): Secondary | ICD-10-CM | POA: Diagnosis not present

## 2017-03-23 DIAGNOSIS — I5030 Unspecified diastolic (congestive) heart failure: Secondary | ICD-10-CM | POA: Diagnosis not present

## 2017-03-24 ENCOUNTER — Ambulatory Visit (HOSPITAL_COMMUNITY)
Admission: RE | Admit: 2017-03-24 | Discharge: 2017-03-24 | Disposition: A | Discharge status: Home / Self Care | Payer: Medicare Other | Source: Ambulatory Visit | Attending: Interventional Radiology | Admitting: Interventional Radiology

## 2017-03-24 DIAGNOSIS — I639 Cerebral infarction, unspecified: Secondary | ICD-10-CM

## 2017-03-24 DIAGNOSIS — I63412 Cerebral infarction due to embolism of left middle cerebral artery: Secondary | ICD-10-CM | POA: Diagnosis not present

## 2017-03-24 HISTORY — PX: IR RADIOLOGIST EVAL & MGMT: IMG5224

## 2017-03-25 ENCOUNTER — Telehealth: Payer: Self-pay | Admitting: Internal Medicine

## 2017-03-25 DIAGNOSIS — I130 Hypertensive heart and chronic kidney disease with heart failure and stage 1 through stage 4 chronic kidney disease, or unspecified chronic kidney disease: Secondary | ICD-10-CM | POA: Diagnosis not present

## 2017-03-25 DIAGNOSIS — Z7901 Long term (current) use of anticoagulants: Secondary | ICD-10-CM | POA: Diagnosis not present

## 2017-03-25 DIAGNOSIS — I69390 Apraxia following cerebral infarction: Secondary | ICD-10-CM | POA: Diagnosis not present

## 2017-03-25 DIAGNOSIS — I69320 Aphasia following cerebral infarction: Secondary | ICD-10-CM | POA: Diagnosis not present

## 2017-03-25 DIAGNOSIS — R7303 Prediabetes: Secondary | ICD-10-CM | POA: Diagnosis not present

## 2017-03-25 DIAGNOSIS — D631 Anemia in chronic kidney disease: Secondary | ICD-10-CM | POA: Diagnosis not present

## 2017-03-25 DIAGNOSIS — I69391 Dysphagia following cerebral infarction: Secondary | ICD-10-CM | POA: Diagnosis not present

## 2017-03-25 DIAGNOSIS — R1310 Dysphagia, unspecified: Secondary | ICD-10-CM | POA: Diagnosis not present

## 2017-03-25 DIAGNOSIS — I69351 Hemiplegia and hemiparesis following cerebral infarction affecting right dominant side: Secondary | ICD-10-CM | POA: Diagnosis not present

## 2017-03-25 DIAGNOSIS — I5030 Unspecified diastolic (congestive) heart failure: Secondary | ICD-10-CM | POA: Diagnosis not present

## 2017-03-25 DIAGNOSIS — N183 Chronic kidney disease, stage 3 (moderate): Secondary | ICD-10-CM | POA: Diagnosis not present

## 2017-03-25 DIAGNOSIS — I4891 Unspecified atrial fibrillation: Secondary | ICD-10-CM | POA: Diagnosis not present

## 2017-03-25 DIAGNOSIS — Z993 Dependence on wheelchair: Secondary | ICD-10-CM | POA: Diagnosis not present

## 2017-03-25 NOTE — Telephone Encounter (Signed)
Valerie Mcclure from [place]  [phone]  Late afternoon pt bp is running a little high, mornings they are ok.    160/80

## 2017-03-26 ENCOUNTER — Encounter (HOSPITAL_COMMUNITY): Payer: Self-pay | Admitting: Interventional Radiology

## 2017-03-26 ENCOUNTER — Telehealth: Payer: Self-pay | Admitting: Internal Medicine

## 2017-03-26 DIAGNOSIS — I4891 Unspecified atrial fibrillation: Secondary | ICD-10-CM | POA: Diagnosis not present

## 2017-03-26 DIAGNOSIS — I5030 Unspecified diastolic (congestive) heart failure: Secondary | ICD-10-CM | POA: Diagnosis not present

## 2017-03-26 DIAGNOSIS — R1310 Dysphagia, unspecified: Secondary | ICD-10-CM | POA: Diagnosis not present

## 2017-03-26 DIAGNOSIS — I130 Hypertensive heart and chronic kidney disease with heart failure and stage 1 through stage 4 chronic kidney disease, or unspecified chronic kidney disease: Secondary | ICD-10-CM | POA: Diagnosis not present

## 2017-03-26 DIAGNOSIS — I69320 Aphasia following cerebral infarction: Secondary | ICD-10-CM | POA: Diagnosis not present

## 2017-03-26 DIAGNOSIS — Z7901 Long term (current) use of anticoagulants: Secondary | ICD-10-CM | POA: Diagnosis not present

## 2017-03-26 DIAGNOSIS — R7303 Prediabetes: Secondary | ICD-10-CM | POA: Diagnosis not present

## 2017-03-26 DIAGNOSIS — I69391 Dysphagia following cerebral infarction: Secondary | ICD-10-CM | POA: Diagnosis not present

## 2017-03-26 DIAGNOSIS — I69351 Hemiplegia and hemiparesis following cerebral infarction affecting right dominant side: Secondary | ICD-10-CM | POA: Diagnosis not present

## 2017-03-26 DIAGNOSIS — I69390 Apraxia following cerebral infarction: Secondary | ICD-10-CM | POA: Diagnosis not present

## 2017-03-26 DIAGNOSIS — Z993 Dependence on wheelchair: Secondary | ICD-10-CM | POA: Diagnosis not present

## 2017-03-26 DIAGNOSIS — R5381 Other malaise: Secondary | ICD-10-CM

## 2017-03-26 DIAGNOSIS — N183 Chronic kidney disease, stage 3 (moderate): Secondary | ICD-10-CM | POA: Diagnosis not present

## 2017-03-26 DIAGNOSIS — D631 Anemia in chronic kidney disease: Secondary | ICD-10-CM | POA: Diagnosis not present

## 2017-03-26 NOTE — Telephone Encounter (Signed)
Valerie Mcclure at home [phone]  Need order for out patient speak, OT and PT at the [facility] rehab   Epic order or fax order  Fax number [phone]

## 2017-03-26 NOTE — Telephone Encounter (Signed)
Orders need to be placed in epic for out pt neuro rehab for Speech, OT and PT.

## 2017-03-26 NOTE — Telephone Encounter (Signed)
Called Valerie Mcclure no answer [facility] w/MD advisement...Johny Chess

## 2017-03-26 NOTE — Telephone Encounter (Signed)
We can try having her take 1/2 of the amlodipine in the morning and 1/2 at night -- this may make the BP more even

## 2017-03-27 DIAGNOSIS — I69390 Apraxia following cerebral infarction: Secondary | ICD-10-CM | POA: Diagnosis not present

## 2017-03-27 DIAGNOSIS — I69391 Dysphagia following cerebral infarction: Secondary | ICD-10-CM | POA: Diagnosis not present

## 2017-03-27 DIAGNOSIS — I130 Hypertensive heart and chronic kidney disease with heart failure and stage 1 through stage 4 chronic kidney disease, or unspecified chronic kidney disease: Secondary | ICD-10-CM | POA: Diagnosis not present

## 2017-03-27 DIAGNOSIS — D631 Anemia in chronic kidney disease: Secondary | ICD-10-CM | POA: Diagnosis not present

## 2017-03-27 DIAGNOSIS — N183 Chronic kidney disease, stage 3 (moderate): Secondary | ICD-10-CM | POA: Diagnosis not present

## 2017-03-27 DIAGNOSIS — I69351 Hemiplegia and hemiparesis following cerebral infarction affecting right dominant side: Secondary | ICD-10-CM | POA: Diagnosis not present

## 2017-03-27 DIAGNOSIS — R1310 Dysphagia, unspecified: Secondary | ICD-10-CM | POA: Diagnosis not present

## 2017-03-27 DIAGNOSIS — Z993 Dependence on wheelchair: Secondary | ICD-10-CM | POA: Diagnosis not present

## 2017-03-27 DIAGNOSIS — R7303 Prediabetes: Secondary | ICD-10-CM | POA: Diagnosis not present

## 2017-03-27 DIAGNOSIS — I69320 Aphasia following cerebral infarction: Secondary | ICD-10-CM | POA: Diagnosis not present

## 2017-03-27 DIAGNOSIS — I4891 Unspecified atrial fibrillation: Secondary | ICD-10-CM | POA: Diagnosis not present

## 2017-03-27 DIAGNOSIS — I5030 Unspecified diastolic (congestive) heart failure: Secondary | ICD-10-CM | POA: Diagnosis not present

## 2017-03-27 DIAGNOSIS — Z7901 Long term (current) use of anticoagulants: Secondary | ICD-10-CM | POA: Diagnosis not present

## 2017-03-27 NOTE — Telephone Encounter (Signed)
ordered

## 2017-03-29 ENCOUNTER — Ambulatory Visit: Payer: Self-pay

## 2017-03-29 DIAGNOSIS — I69351 Hemiplegia and hemiparesis following cerebral infarction affecting right dominant side: Secondary | ICD-10-CM | POA: Diagnosis not present

## 2017-03-29 DIAGNOSIS — Z993 Dependence on wheelchair: Secondary | ICD-10-CM | POA: Diagnosis not present

## 2017-03-29 DIAGNOSIS — I69391 Dysphagia following cerebral infarction: Secondary | ICD-10-CM | POA: Diagnosis not present

## 2017-03-29 DIAGNOSIS — I69390 Apraxia following cerebral infarction: Secondary | ICD-10-CM | POA: Diagnosis not present

## 2017-03-29 DIAGNOSIS — I5030 Unspecified diastolic (congestive) heart failure: Secondary | ICD-10-CM | POA: Diagnosis not present

## 2017-03-29 DIAGNOSIS — I4891 Unspecified atrial fibrillation: Secondary | ICD-10-CM | POA: Diagnosis not present

## 2017-03-29 DIAGNOSIS — I130 Hypertensive heart and chronic kidney disease with heart failure and stage 1 through stage 4 chronic kidney disease, or unspecified chronic kidney disease: Secondary | ICD-10-CM | POA: Diagnosis not present

## 2017-03-29 DIAGNOSIS — R1310 Dysphagia, unspecified: Secondary | ICD-10-CM | POA: Diagnosis not present

## 2017-03-29 DIAGNOSIS — R7303 Prediabetes: Secondary | ICD-10-CM | POA: Diagnosis not present

## 2017-03-29 DIAGNOSIS — I69320 Aphasia following cerebral infarction: Secondary | ICD-10-CM | POA: Diagnosis not present

## 2017-03-29 DIAGNOSIS — Z7901 Long term (current) use of anticoagulants: Secondary | ICD-10-CM | POA: Diagnosis not present

## 2017-03-29 DIAGNOSIS — D631 Anemia in chronic kidney disease: Secondary | ICD-10-CM | POA: Diagnosis not present

## 2017-03-29 DIAGNOSIS — N183 Chronic kidney disease, stage 3 (moderate): Secondary | ICD-10-CM | POA: Diagnosis not present

## 2017-03-30 DIAGNOSIS — R7303 Prediabetes: Secondary | ICD-10-CM | POA: Diagnosis not present

## 2017-03-30 DIAGNOSIS — N183 Chronic kidney disease, stage 3 (moderate): Secondary | ICD-10-CM | POA: Diagnosis not present

## 2017-03-30 DIAGNOSIS — I69351 Hemiplegia and hemiparesis following cerebral infarction affecting right dominant side: Secondary | ICD-10-CM | POA: Diagnosis not present

## 2017-03-30 DIAGNOSIS — Z9181 History of falling: Secondary | ICD-10-CM | POA: Diagnosis not present

## 2017-03-30 DIAGNOSIS — R1310 Dysphagia, unspecified: Secondary | ICD-10-CM | POA: Diagnosis not present

## 2017-03-30 DIAGNOSIS — I5030 Unspecified diastolic (congestive) heart failure: Secondary | ICD-10-CM | POA: Diagnosis not present

## 2017-03-30 DIAGNOSIS — I69390 Apraxia following cerebral infarction: Secondary | ICD-10-CM | POA: Diagnosis not present

## 2017-03-30 DIAGNOSIS — I130 Hypertensive heart and chronic kidney disease with heart failure and stage 1 through stage 4 chronic kidney disease, or unspecified chronic kidney disease: Secondary | ICD-10-CM | POA: Diagnosis not present

## 2017-03-30 DIAGNOSIS — Z7901 Long term (current) use of anticoagulants: Secondary | ICD-10-CM | POA: Diagnosis not present

## 2017-03-30 DIAGNOSIS — D631 Anemia in chronic kidney disease: Secondary | ICD-10-CM | POA: Diagnosis not present

## 2017-03-30 DIAGNOSIS — I69320 Aphasia following cerebral infarction: Secondary | ICD-10-CM | POA: Diagnosis not present

## 2017-03-30 DIAGNOSIS — Z993 Dependence on wheelchair: Secondary | ICD-10-CM | POA: Diagnosis not present

## 2017-03-30 DIAGNOSIS — I4891 Unspecified atrial fibrillation: Secondary | ICD-10-CM | POA: Diagnosis not present

## 2017-03-30 DIAGNOSIS — I69391 Dysphagia following cerebral infarction: Secondary | ICD-10-CM | POA: Diagnosis not present

## 2017-03-31 DIAGNOSIS — R1310 Dysphagia, unspecified: Secondary | ICD-10-CM | POA: Diagnosis not present

## 2017-03-31 DIAGNOSIS — I4891 Unspecified atrial fibrillation: Secondary | ICD-10-CM | POA: Diagnosis not present

## 2017-03-31 DIAGNOSIS — I69351 Hemiplegia and hemiparesis following cerebral infarction affecting right dominant side: Secondary | ICD-10-CM | POA: Diagnosis not present

## 2017-03-31 DIAGNOSIS — D631 Anemia in chronic kidney disease: Secondary | ICD-10-CM | POA: Diagnosis not present

## 2017-03-31 DIAGNOSIS — R7303 Prediabetes: Secondary | ICD-10-CM | POA: Diagnosis not present

## 2017-03-31 DIAGNOSIS — Z993 Dependence on wheelchair: Secondary | ICD-10-CM | POA: Diagnosis not present

## 2017-03-31 DIAGNOSIS — Z7901 Long term (current) use of anticoagulants: Secondary | ICD-10-CM | POA: Diagnosis not present

## 2017-03-31 DIAGNOSIS — I69391 Dysphagia following cerebral infarction: Secondary | ICD-10-CM | POA: Diagnosis not present

## 2017-03-31 DIAGNOSIS — I69390 Apraxia following cerebral infarction: Secondary | ICD-10-CM | POA: Diagnosis not present

## 2017-03-31 DIAGNOSIS — I69320 Aphasia following cerebral infarction: Secondary | ICD-10-CM | POA: Diagnosis not present

## 2017-03-31 DIAGNOSIS — N183 Chronic kidney disease, stage 3 (moderate): Secondary | ICD-10-CM | POA: Diagnosis not present

## 2017-03-31 DIAGNOSIS — I5030 Unspecified diastolic (congestive) heart failure: Secondary | ICD-10-CM | POA: Diagnosis not present

## 2017-03-31 DIAGNOSIS — I130 Hypertensive heart and chronic kidney disease with heart failure and stage 1 through stage 4 chronic kidney disease, or unspecified chronic kidney disease: Secondary | ICD-10-CM | POA: Diagnosis not present

## 2017-04-01 ENCOUNTER — Telehealth: Payer: Self-pay | Admitting: Internal Medicine

## 2017-04-01 DIAGNOSIS — I69391 Dysphagia following cerebral infarction: Secondary | ICD-10-CM | POA: Diagnosis not present

## 2017-04-01 DIAGNOSIS — I5030 Unspecified diastolic (congestive) heart failure: Secondary | ICD-10-CM | POA: Diagnosis not present

## 2017-04-01 DIAGNOSIS — I69351 Hemiplegia and hemiparesis following cerebral infarction affecting right dominant side: Secondary | ICD-10-CM | POA: Diagnosis not present

## 2017-04-01 DIAGNOSIS — D631 Anemia in chronic kidney disease: Secondary | ICD-10-CM | POA: Diagnosis not present

## 2017-04-01 DIAGNOSIS — R7303 Prediabetes: Secondary | ICD-10-CM | POA: Diagnosis not present

## 2017-04-01 DIAGNOSIS — R1310 Dysphagia, unspecified: Secondary | ICD-10-CM | POA: Diagnosis not present

## 2017-04-01 DIAGNOSIS — I4891 Unspecified atrial fibrillation: Secondary | ICD-10-CM | POA: Diagnosis not present

## 2017-04-01 DIAGNOSIS — I130 Hypertensive heart and chronic kidney disease with heart failure and stage 1 through stage 4 chronic kidney disease, or unspecified chronic kidney disease: Secondary | ICD-10-CM | POA: Diagnosis not present

## 2017-04-01 DIAGNOSIS — N183 Chronic kidney disease, stage 3 (moderate): Secondary | ICD-10-CM | POA: Diagnosis not present

## 2017-04-01 DIAGNOSIS — Z993 Dependence on wheelchair: Secondary | ICD-10-CM | POA: Diagnosis not present

## 2017-04-01 DIAGNOSIS — Z7901 Long term (current) use of anticoagulants: Secondary | ICD-10-CM | POA: Diagnosis not present

## 2017-04-01 DIAGNOSIS — I69320 Aphasia following cerebral infarction: Secondary | ICD-10-CM | POA: Diagnosis not present

## 2017-04-01 DIAGNOSIS — I69390 Apraxia following cerebral infarction: Secondary | ICD-10-CM | POA: Diagnosis not present

## 2017-04-01 NOTE — Telephone Encounter (Signed)
Needs verbals speech therapy for extension to be effective 9/16 2week1

## 2017-04-01 NOTE — Telephone Encounter (Signed)
Spoke with Tye Maryland to give verbal orders per MD.

## 2017-04-05 ENCOUNTER — Telehealth: Payer: Self-pay | Admitting: Internal Medicine

## 2017-04-05 NOTE — Telephone Encounter (Signed)
She is on two medications that can lower her heart rate.  If her heart rate is frequently low we may need to adjust her medication, but we also need to keep her BP well controlled, so we may need to add a different medication.

## 2017-04-05 NOTE — Telephone Encounter (Signed)
[name](home health nurse) called with concerning vitals. States that patients heart rate is 54. States patient also feels washed out and dizzy. She was unable to reach [facility].

## 2017-04-06 NOTE — Telephone Encounter (Signed)
LVM with Juliann Pulse with MDs response.

## 2017-04-07 DIAGNOSIS — I4891 Unspecified atrial fibrillation: Secondary | ICD-10-CM | POA: Diagnosis not present

## 2017-04-07 DIAGNOSIS — R1310 Dysphagia, unspecified: Secondary | ICD-10-CM | POA: Diagnosis not present

## 2017-04-07 DIAGNOSIS — Z993 Dependence on wheelchair: Secondary | ICD-10-CM | POA: Diagnosis not present

## 2017-04-07 DIAGNOSIS — I69390 Apraxia following cerebral infarction: Secondary | ICD-10-CM | POA: Diagnosis not present

## 2017-04-07 DIAGNOSIS — I69320 Aphasia following cerebral infarction: Secondary | ICD-10-CM | POA: Diagnosis not present

## 2017-04-07 DIAGNOSIS — I69391 Dysphagia following cerebral infarction: Secondary | ICD-10-CM | POA: Diagnosis not present

## 2017-04-07 DIAGNOSIS — D631 Anemia in chronic kidney disease: Secondary | ICD-10-CM | POA: Diagnosis not present

## 2017-04-07 DIAGNOSIS — I5030 Unspecified diastolic (congestive) heart failure: Secondary | ICD-10-CM | POA: Diagnosis not present

## 2017-04-07 DIAGNOSIS — N183 Chronic kidney disease, stage 3 (moderate): Secondary | ICD-10-CM | POA: Diagnosis not present

## 2017-04-07 DIAGNOSIS — R7303 Prediabetes: Secondary | ICD-10-CM | POA: Diagnosis not present

## 2017-04-07 DIAGNOSIS — I130 Hypertensive heart and chronic kidney disease with heart failure and stage 1 through stage 4 chronic kidney disease, or unspecified chronic kidney disease: Secondary | ICD-10-CM | POA: Diagnosis not present

## 2017-04-07 DIAGNOSIS — Z7901 Long term (current) use of anticoagulants: Secondary | ICD-10-CM | POA: Diagnosis not present

## 2017-04-07 DIAGNOSIS — I69351 Hemiplegia and hemiparesis following cerebral infarction affecting right dominant side: Secondary | ICD-10-CM | POA: Diagnosis not present

## 2017-04-08 ENCOUNTER — Encounter (HOSPITAL_COMMUNITY): Payer: Self-pay | Admitting: Emergency Medicine

## 2017-04-08 DIAGNOSIS — I6330 Cerebral infarction due to thrombosis of unspecified cerebral artery: Secondary | ICD-10-CM | POA: Diagnosis not present

## 2017-04-08 DIAGNOSIS — Z5321 Procedure and treatment not carried out due to patient leaving prior to being seen by health care provider: Secondary | ICD-10-CM | POA: Insufficient documentation

## 2017-04-08 DIAGNOSIS — R000 Tachycardia, unspecified: Secondary | ICD-10-CM | POA: Diagnosis not present

## 2017-04-08 DIAGNOSIS — R001 Bradycardia, unspecified: Secondary | ICD-10-CM | POA: Diagnosis present

## 2017-04-08 LAB — CBC
HEMATOCRIT: 40.8 % (ref 36.0–46.0)
Hemoglobin: 13.5 g/dL (ref 12.0–15.0)
MCH: 29.3 pg (ref 26.0–34.0)
MCHC: 33.1 g/dL (ref 30.0–36.0)
MCV: 88.5 fL (ref 78.0–100.0)
PLATELETS: 361 10*3/uL (ref 150–400)
RBC: 4.61 MIL/uL (ref 3.87–5.11)
RDW: 14.5 % (ref 11.5–15.5)
WBC: 9.3 10*3/uL (ref 4.0–10.5)

## 2017-04-08 LAB — I-STAT TROPONIN, ED: Troponin i, poc: 0.02 ng/mL (ref 0.00–0.08)

## 2017-04-08 NOTE — ED Triage Notes (Signed)
Patient arrives from home with bradycardia at home. Husband states that home health nurse left a monitor for them to use at home and asked him to check her pulse a few times daily. Tonight he noted her pulse rates to be in upper 40s. Spoke with RN via phone and was advised to come to hospital for evaluation. Patient denies symptoms. BP adequate in triage with a HR @ 54-60.

## 2017-04-09 ENCOUNTER — Other Ambulatory Visit: Payer: Self-pay | Admitting: Internal Medicine

## 2017-04-09 ENCOUNTER — Emergency Department (HOSPITAL_COMMUNITY)
Admission: EM | Admit: 2017-04-09 | Discharge: 2017-04-09 | Disposition: A | Discharge status: Home / Self Care | Payer: Medicare Other | Attending: Emergency Medicine | Admitting: Emergency Medicine

## 2017-04-09 DIAGNOSIS — Z7901 Long term (current) use of anticoagulants: Secondary | ICD-10-CM | POA: Diagnosis not present

## 2017-04-09 DIAGNOSIS — I69391 Dysphagia following cerebral infarction: Secondary | ICD-10-CM | POA: Diagnosis not present

## 2017-04-09 DIAGNOSIS — R7303 Prediabetes: Secondary | ICD-10-CM | POA: Diagnosis not present

## 2017-04-09 DIAGNOSIS — D631 Anemia in chronic kidney disease: Secondary | ICD-10-CM | POA: Diagnosis not present

## 2017-04-09 DIAGNOSIS — I5030 Unspecified diastolic (congestive) heart failure: Secondary | ICD-10-CM | POA: Diagnosis not present

## 2017-04-09 DIAGNOSIS — I4891 Unspecified atrial fibrillation: Secondary | ICD-10-CM | POA: Diagnosis not present

## 2017-04-09 DIAGNOSIS — N183 Chronic kidney disease, stage 3 (moderate): Secondary | ICD-10-CM | POA: Diagnosis not present

## 2017-04-09 DIAGNOSIS — Z993 Dependence on wheelchair: Secondary | ICD-10-CM | POA: Diagnosis not present

## 2017-04-09 DIAGNOSIS — I69390 Apraxia following cerebral infarction: Secondary | ICD-10-CM | POA: Diagnosis not present

## 2017-04-09 DIAGNOSIS — R1310 Dysphagia, unspecified: Secondary | ICD-10-CM | POA: Diagnosis not present

## 2017-04-09 DIAGNOSIS — I69320 Aphasia following cerebral infarction: Secondary | ICD-10-CM | POA: Diagnosis not present

## 2017-04-09 DIAGNOSIS — I130 Hypertensive heart and chronic kidney disease with heart failure and stage 1 through stage 4 chronic kidney disease, or unspecified chronic kidney disease: Secondary | ICD-10-CM | POA: Diagnosis not present

## 2017-04-09 DIAGNOSIS — I69351 Hemiplegia and hemiparesis following cerebral infarction affecting right dominant side: Secondary | ICD-10-CM | POA: Diagnosis not present

## 2017-04-09 LAB — BASIC METABOLIC PANEL
Anion gap: 14 (ref 5–15)
BUN: 24 mg/dL — AB (ref 6–20)
CHLORIDE: 103 mmol/L (ref 101–111)
CO2: 23 mmol/L (ref 22–32)
CREATININE: 1.37 mg/dL — AB (ref 0.44–1.00)
Calcium: 9.6 mg/dL (ref 8.9–10.3)
GFR calc Af Amer: 42 mL/min — ABNORMAL LOW (ref >60)
GFR calc non Af Amer: 36 mL/min — ABNORMAL LOW (ref >60)
GLUCOSE: 102 mg/dL — AB (ref 65–99)
Potassium: 3.5 mmol/L (ref 3.5–5.1)
SODIUM: 140 mmol/L (ref 135–145)

## 2017-04-09 MED ORDER — HYDRALAZINE HCL 25 MG PO TABS: 25.0000 mg | ORAL_TABLET | Freq: Three times a day (TID) | ORAL | 2 refills | Status: DC

## 2017-04-09 MED ORDER — AMLODIPINE BESYLATE 10 MG PO TABS: 5.0000 mg | ORAL_TABLET | Freq: Every day | ORAL | 1 refills | Status: DC

## 2017-04-09 NOTE — Telephone Encounter (Signed)
Called Juliann Pulse back she states she was returning callback from someone . Per chart Lovena Le left msg for [place] to RTC. On Wednesday. Juliann Pulse states family hs been checking BP for a week know. BP has been doing great, but hher HR is low. Been running in the 50's. Juliann Pulse saw pt this am BP was 132/76, HR 54. She been c/o being swinny headed & fatigue. Pt went to ER yesterday concerning her low HR, but it was a long wait so pt/family left..wanting to see what MD recommend....Johny Chess

## 2017-04-09 NOTE — Telephone Encounter (Signed)
Both BP medications she is on can dec her HR.  We can decrease her amlodipine to 5 mg daily since she is symptomatic due to her HR is low.  She will likely need a new medication to help keep her BP well controlled.  We are limited in what we can use due to her kidney function and allergies.    Decrease amlodipine to 5 mg daily Continue metoprolol at current dose. Start hydralazine 25 mg three times a day  Monitor BP closely and call next week with numbers.     Med list changed.  Hydralazine pending.

## 2017-04-09 NOTE — Telephone Encounter (Signed)
Beth called back forr Juliann Pulse since she was not able to answer phone. Marylou Flesher, RN MD response on med change. Inform [facility]...Johny Chess

## 2017-04-09 NOTE — Telephone Encounter (Signed)
Called Valerie Mcclure no answer [facility] RTC ASAP...Valerie Mcclure

## 2017-04-09 NOTE — Telephone Encounter (Signed)
Cathy with [facility]  [phone]  Her bp has been fine but she need to speak to nurse about adjusting meds

## 2017-04-09 NOTE — ED Notes (Signed)
Called for VS reassessment without response.

## 2017-04-11 NOTE — Progress Notes (Signed)
Subjective:    Patient ID: Valerie Mcclure, female    DOB: Jun 23, 1941, 76 y.o.   MRN: [id number]  HPI The patient is here for follow up.  Bradycardia, hypertension:  Last week she reported having a low HR and not feeling well with the lower HR (41-50's)- lightheaded and washed out.  She has been taking her medications daily as prescribed. She is exercising with PT.    She denies any chest pain, palpitations and headaches.  She does have some leg swelling - right leg more than the left.    Medications and allergies reviewed with patient and updated if appropriate.  Patient Active Problem List   Diagnosis Date Noted  . Depression 01/29/2017  . Fall   . Physical deconditioning   . Aphasia as late effect of cerebrovascular accident (CVA)   . PAF (paroxysmal atrial fibrillation) ([place])   . Chronic diastolic heart failure ([place])   . Dysarthria, post-stroke   . Hypoalbuminemia due to protein-calorie malnutrition ([place])   . Global aphasia   . Encephalopathy 12/23/2016  . Acute on chronic diastolic heart failure ([place])   . Acute blood loss anemia   . Tachypnea   . Toxic encephalopathy 12/22/2016  . Atrial flutter ([place]) 12/22/2016  . Hypokalemia 12/21/2016  . Obesity (BMI 30-39.9) 12/21/2016  . Spastic hemiplegia of right dominant side as late effect of cerebral infarction ([place])   . E-coli UTI   . Anemia of chronic disease   . Benign essential HTN   . Dysphagia, post-stroke   . Anterior cerebral circulation hemorrhagic infarction ([place]) 12/03/2016  . PAC (premature atrial contraction) 12/03/2016  . Acute ischemic left MCA stroke ([place]) 12/03/2016  . Aphasia as late effect of stroke 12/03/2016  . Right hemiparesis ([place])   . Nontraumatic subcortical hemorrhage of left cerebral hemisphere ([place])   . Acute embolic stroke ([place]) 74/25/9563  . Mild aortic regurgitation 08/20/2016  . Bilateral edema of lower extremity 04/07/2016  . Pancreatic duct dilated 01/09/2016  . CKD (chronic  kidney disease) 12/25/2015  . Mild tricuspid regurgitation 12/25/2015  . Mild mitral regurgitation 12/25/2015  . Prediabetes 06/10/2015  . Edema 06/04/2015  . Abdominal pain, chronic, right upper quadrant 06/04/2015  . Chronic low back pain   . PAIN IN THORACIC SPINE 04/18/2010  . Coronary atherosclerosis 01/24/2009  . DEGENERATIVE JOINT DISEASE 01/23/2009  . ARTHRITIS, LEFT KNEE 12/13/2008  . HERNIATED LUMBAR DISC 12/13/2008  . Hypothyroidism 11/21/2007  . Hyperlipidemia 11/09/2007  . ANEMIA 11/09/2007  . Essential hypertension 11/09/2007  . Osteopenia 11/09/2007    Current Outpatient Prescriptions on File Prior to Visit  Medication Sig Dispense Refill  . amLODipine (NORVASC) 10 MG tablet Take 0.5 tablets (5 mg total) by mouth daily. 45 tablet 1  . apixaban (ELIQUIS) 5 MG TABS tablet Take 1 tablet (5 mg total) by mouth 2 (two) times daily. 60 tablet 5  . atorvastatin (LIPITOR) 10 MG tablet Take 0.5 tablets (5 mg total) by mouth daily at 6 PM. 45 tablet 1  . Calcium Carbonate-Vit D-Min (CALCIUM 1200 PO) Take 1 tablet by mouth daily.     . Cholecalciferol (VITAMIN D3) 1000 UNITS CAPS Take 1,000 Units by mouth daily.     . hydrALAZINE (APRESOLINE) 25 MG tablet Take 1 tablet (25 mg total) by mouth 3 (three) times daily. 90 tablet 2  . levothyroxine (SYNTHROID, LEVOTHROID) 88 MCG tablet Take 1 tablet (88 mcg total) by mouth daily. 90 tablet 1  . Maltodextrin-Xanthan Gum ([place])  POWD Use as needed to get liquid to nectar thick consistency 3 Can 1  . metoprolol tartrate (LOPRESSOR) 25 MG tablet TAKE 1 TABLET(25 MG) BY MOUTH TWICE DAILY 180 tablet 1  . nortriptyline (PAMELOR) 10 MG capsule TAKE 2 CAPSULES BY MOUTH AT BEDTIME 180 capsule 1  . polycarbophil (FIBERCON) 625 MG tablet Take 2 tablets (1,250 mg total) by mouth daily. 60 tablet 0  . traMADol (ULTRAM) 50 MG tablet Take 1 tablet (50 mg total) by mouth every 8 (eight) hours as needed. 60 tablet 0  . vitamin B-12  (CYANOCOBALAMIN) 100 MCG tablet Take 100 mcg by mouth daily.    . potassium chloride SA (K-DUR,KLOR-CON) 20 MEQ tablet Take 1 tablet (20 mEq total) by mouth once. 90 tablet 1   No current facility-administered medications on file prior to visit.     Past Medical History:  Diagnosis Date  . Blood transfusion without reported diagnosis   . Chronic low back pain   . Hyperlipidemia   . Osteopenia   . Unspecified essential hypertension   . Unspecified hypothyroidism     Past Surgical History:  Procedure Laterality Date  . ABDOMINAL HYSTERECTOMY  1970  . CHOLECYSTECTOMY  07/2009   Dr. Rise Patience  . COLONOSCOPY  2003  . FLEXIBLE SIGMOIDOSCOPY  2010  . HAND SURGERY    . IR ANGIO INTRA EXTRACRAN SEL COM CAROTID INNOMINATE UNI L MOD SED  11/29/2016  . IR ANGIO VERTEBRAL SEL SUBCLAVIAN INNOMINATE UNI R MOD SED  11/29/2016  . IR PERCUTANEOUS ART THROMBECTOMY/INFUSION INTRACRANIAL INC DIAG ANGIO  11/29/2016  . IR RADIOLOGIST EVAL & MGMT  03/24/2017  . LUMBAR LAMINECTOMY  11/2008   Done by Dr. Patrice Paradise  . RADIOLOGY WITH ANESTHESIA N/A 11/29/2016   Procedure: RADIOLOGY WITH ANESTHESIA;  Surgeon: Radiologist, Medication, MD;  Location: [place];  Service: Radiology;  Laterality: N/A;  . THYROIDECTOMY      Social History   Social History  . Marital status: Married    Spouse name: N/A  . Number of children: N/A  . Years of education: N/A   Social History Main Topics  . Smoking status: Never Smoker  . Smokeless tobacco: Never Used  . Alcohol use No  . Drug use: No  . Sexual activity: Not Currently   Other Topics Concern  . None   Social History Narrative   Married    Family History  Problem Relation Age of Onset  . Heart disease Father 59       MI age 62s  . Lung cancer Brother 17  . Colon cancer Neg Hx   . Esophageal cancer Neg Hx   . Rectal cancer Neg Hx   . Stomach cancer Neg Hx     Review of Systems  Constitutional: Positive for fatigue. Negative for activity change, appetite  change, chills and fever.  Respiratory: Negative for cough, shortness of breath and wheezing.   Cardiovascular: Positive for leg swelling (right leg). Negative for chest pain and palpitations.  Neurological: Positive for light-headedness. Negative for headaches.       Objective:   Vitals:   04/12/17 1514  BP: 118/76  Pulse: 64  Temp: 98 F (36.7 C)  SpO2: 96%   Wt Readings from Last 3 Encounters:  04/12/17 153 lb (69.4 kg)  01/29/17 152 lb (68.9 kg)  01/19/17 156 lb 4.8 oz (70.9 kg)   Body mass index is 27.1 kg/m.   Physical Exam    Constitutional: Appears well-developed and well-nourished. No distress.  HENT:  Head: Normocephalic and atraumatic.  Neck: Neck supple. No tracheal deviation present. No thyromegaly present.  No cervical lymphadenopathy Cardiovascular: Normal rate, regular rhythm and normal heart sounds.   No murmur heard. No carotid bruit .  Mild right > left LE edema Pulmonary/Chest: Effort normal and breath sounds normal. No respiratory distress. No has no wheezes. No rales.  Skin: Skin is warm and dry. Not diaphoretic.  Psychiatric: Normal mood and affect. Behavior is normal.      Assessment & Plan:    See Problem List for Assessment and Plan of chronic medical problems.

## 2017-04-12 ENCOUNTER — Encounter: Payer: Self-pay | Admitting: Internal Medicine

## 2017-04-12 ENCOUNTER — Ambulatory Visit (INDEPENDENT_AMBULATORY_CARE_PROVIDER_SITE_OTHER): Payer: Medicare Other | Admitting: Internal Medicine

## 2017-04-12 VITALS — BP 118/76 | HR 64 | Temp 98.0°F | Ht 63.0 in | Wt 153.0 lb

## 2017-04-12 DIAGNOSIS — Z23 Encounter for immunization: Secondary | ICD-10-CM

## 2017-04-12 DIAGNOSIS — I10 Essential (primary) hypertension: Secondary | ICD-10-CM | POA: Diagnosis not present

## 2017-04-12 DIAGNOSIS — R001 Bradycardia, unspecified: Secondary | ICD-10-CM | POA: Diagnosis not present

## 2017-04-12 NOTE — Assessment & Plan Note (Signed)
At home - 41-50's - symptomatic with lightheadedness and fatigue Since she is symptomatic will decrease amlodipine to 5 mg and continue other medications She will monitor her BP and HR and let me know if BP is too high and HR is too low - will adjust medications further if needed - may need to decrease metoprolol and/or increase hydralazine

## 2017-04-12 NOTE — Patient Instructions (Signed)
Decrease amlodipine from 10 mg to 5 mg (1/2 pill ) daily.  Continue all other medications.   Monitor and record BP an HR.  Let me know if your BP is frequently > 130/80.  HR ideally should be more than 58.     Call with questions or concerns.

## 2017-04-12 NOTE — Assessment & Plan Note (Signed)
BP good here today and has been well controlled at home, but having low HR and symptomatic At home - 41-50's - symptomatic with lightheadedness and fatigue Since she is symptomatic will decrease amlodipine to 5 mg and continue other medications She will monitor her BP and HR and let me know if BP is too high and HR is too low - will adjust medications further if needed - may need to decrease metoprolol and/or increase hydralazine

## 2017-04-13 ENCOUNTER — Ambulatory Visit: Payer: Medicare Other | Attending: Internal Medicine | Admitting: Physical Therapy

## 2017-04-13 ENCOUNTER — Ambulatory Visit: Payer: Medicare Other | Admitting: Occupational Therapy

## 2017-04-13 ENCOUNTER — Telehealth: Payer: Self-pay | Admitting: Internal Medicine

## 2017-04-13 DIAGNOSIS — I69351 Hemiplegia and hemiparesis following cerebral infarction affecting right dominant side: Secondary | ICD-10-CM | POA: Diagnosis not present

## 2017-04-13 DIAGNOSIS — I69318 Other symptoms and signs involving cognitive functions following cerebral infarction: Secondary | ICD-10-CM | POA: Diagnosis not present

## 2017-04-13 DIAGNOSIS — R293 Abnormal posture: Secondary | ICD-10-CM | POA: Insufficient documentation

## 2017-04-13 DIAGNOSIS — R2689 Other abnormalities of gait and mobility: Secondary | ICD-10-CM

## 2017-04-13 DIAGNOSIS — R278 Other lack of coordination: Secondary | ICD-10-CM | POA: Diagnosis not present

## 2017-04-13 DIAGNOSIS — R2681 Unsteadiness on feet: Secondary | ICD-10-CM

## 2017-04-13 DIAGNOSIS — R29818 Other symptoms and signs involving the nervous system: Secondary | ICD-10-CM | POA: Diagnosis not present

## 2017-04-13 DIAGNOSIS — M6281 Muscle weakness (generalized): Secondary | ICD-10-CM

## 2017-04-13 NOTE — Telephone Encounter (Signed)
Valerie Mcclure from [facility] called requesting verbal orders for speech therapy one time a week for 1 week and two times a week for 3 weeks. They are also requesting PT and OT evaluation.

## 2017-04-13 NOTE — Therapy (Signed)
[facility] [address] [place], [place], [address] Phone: [phone]   Fax:  [phone]  Physical Therapy Evaluation  Patient Details  Name: Valerie Mcclure MRN: [id number] Date of Birth: 1940-09-01 Referring Provider: Billey Gosling, MD  Encounter Date: 04/13/2017      PT End of Session - 04/13/17 1446    Visit Number 1   Number of Visits 18   Date for PT Re-Evaluation 06/12/17   Authorization Type [employer] Medicare-GCODE every 10th visit   PT Start Time 1025  Pt/husband filling out FOTO with front office staff   PT Stop Time 1100   PT Time Calculation (min) 35 min   Activity Tolerance Patient tolerated treatment well   Behavior During Therapy [facility] for tasks assessed/performed      Past Medical History:  Diagnosis Date  . Blood transfusion without reported diagnosis   . Chronic low back pain   . Hyperlipidemia   . Osteopenia   . Unspecified essential hypertension   . Unspecified hypothyroidism     Past Surgical History:  Procedure Laterality Date  . ABDOMINAL HYSTERECTOMY  1970  . CHOLECYSTECTOMY  07/2009   Dr. Rise Patience  . COLONOSCOPY  2003  . FLEXIBLE SIGMOIDOSCOPY  2010  . HAND SURGERY    . IR ANGIO INTRA EXTRACRAN SEL COM CAROTID INNOMINATE UNI L MOD SED  11/29/2016  . IR ANGIO VERTEBRAL SEL SUBCLAVIAN INNOMINATE UNI R MOD SED  11/29/2016  . IR PERCUTANEOUS ART THROMBECTOMY/INFUSION INTRACRANIAL INC DIAG ANGIO  11/29/2016  . IR RADIOLOGIST EVAL & MGMT  03/24/2017  . LUMBAR LAMINECTOMY  11/2008   Done by Dr. Patrice Paradise  . RADIOLOGY WITH ANESTHESIA N/A 11/29/2016   Procedure: RADIOLOGY WITH ANESTHESIA;  Surgeon: Radiologist, Medication, MD;  Location: [facility];  Service: Radiology;  Laterality: N/A;  . THYROIDECTOMY      There were no vitals filed for this visit.       Subjective Assessment - 04/13/17 1029    Subjective Pt wants to work on leg strength and R arm.  Pt with history of L MCA infarct, R sided  weakness in May 2018, with pt participating in inpatient rehab and [facility] therapies.  Therapist recommend use of cane, but pt not using it.     Patient is accompained by: Family member  Husband, Valerie Mcclure   Pertinent History L MCA infarct with aphasia, spastic R hemiplegia 11/2016, hx of paroxysmal A-fib, diastolic heart failure, chronic low back pain, DJD L knee   Patient Stated Goals Pt's goals for therapy are to improve leg strength, walking and turns.   Currently in Pain? No/denies            [facility] PT Assessment - 04/13/17 1033      Assessment   Medical Diagnosis R spastic hemiplegia s/p L MCA infarct   Referring Provider Billey Gosling, MD   Onset Date/Surgical Date 11/29/16  CVA/hospitalization     Precautions   Precautions Fall  No driving     Balance Screen   Has the patient fallen in the past 6 months No   Has the patient had a decrease in activity level because of a fear of falling?  Yes   Is the patient reluctant to leave their home because of a fear of falling?  No     Home Social worker Private residence   Living Arrangements Spouse/significant other   Available Help at Discharge Family   Type of [place]  Home Access Level entry   [place] - single point;Wheelchair - power;Tub bench;Bedside commode     Prior Function   Level of Independence Independent   Vocation Retired   Leisure Enjoyed shopping, going out to eat     Observation/Other Assessments   Focus on Therapeutic Outcomes (FOTO)  Functional Intake Status measure:  39; Neuro QOL 34.3%     Posture/Postural Control   Posture/Postural Control Postural limitations  Husband reports this posture is pre-CVA, back related   Postural Limitations Rounded Shoulders;Forward head;Posterior pelvic tilt   Posture Comments In standing:  left lateral lean, L shoulder lower than R, R hip higher than L     ROM / Strength   AROM / PROM / Strength Strength;AROM      AROM   Overall AROM Comments With RLE active ROM, pt tends to have increased supination/inversion with knee extension      Strength   Strength Assessment Site Hip;Knee;Ankle   Right/Left Hip Right;Left   Right Hip Flexion 4/5   Left Hip Flexion 4/5   Right/Left Knee Right;Left   Right Knee Flexion 3+/5   Right Knee Extension 4/5   Left Knee Flexion 4/5   Left Knee Extension 4/5   Right/Left Ankle Right;Left   Right Ankle Dorsiflexion 3/5   Left Ankle Dorsiflexion 3+/5     Transfers   Transfers Sit to Stand;Stand to Sit   Sit to Stand 6: Modified independent (Device/Increase time);With upper extremity assist;From chair/3-in-1   Stand to Sit 6: Modified independent (Device/Increase time);With upper extremity assist;To chair/3-in-1     Ambulation/Gait   Ambulation/Gait Yes   Ambulation/Gait Assistance 5: Supervision   Ambulation Distance (Feet) 150 Feet   Assistive device None   Gait Pattern Step-through pattern;Decreased arm swing - right;Decreased step length - right;Decreased step length - left;Decreased stance time - right;Narrow base of support;Lateral trunk lean to left   Ambulation Surface Level;Indoor   Gait velocity 23.96 sec = 1.37 ft/sec     Standardized Balance Assessment   Standardized Balance Assessment Timed Up and Go Test;Berg Balance Test     Berg Balance Test   Sit to Stand Able to stand  independently using hands   Standing Unsupported Able to stand 2 minutes with supervision   Sitting with Back Unsupported but Feet Supported on Floor or Stool Able to sit safely and securely 2 minutes   Stand to Sit Sits safely with minimal use of hands   Transfers Able to transfer with verbal cueing and /or supervision   Standing Unsupported with Eyes Closed Able to stand 10 seconds with supervision   Standing Ubsupported with Feet Together Able to place feet together independently and stand for 1 minute with supervision   From Standing, Reach Forward with Outstretched Arm  Reaches forward but needs supervision   From Standing Position, Pick up Object from Floor Unable to try/needs assist to keep balance   From Standing Position, Turn to Look Behind Over each Shoulder Needs supervision when turning   Turn 360 Degrees Needs close supervision or verbal cueing  8.59   Standing Unsupported, Alternately Place Feet on Step/Stool Needs assistance to keep from falling or unable to try   Standing Unsupported, One Foot in Front Able to take small step independently and hold 30 seconds   Standing on One Leg Tries to lift leg/unable to hold 3 seconds but remains standing independently   Total Score 28   Berg comment: Scores <45/56  indicate increased fall risk.     Timed Up and Go Test   Normal TUG (seconds) 19.72   TUG Comments Scores >13.5 sec indicate increased fall risk.            Objective measurements completed on examination: See above findings.                  PT Education - 04/13/17 1444    Education provided Yes   Education Details Educated patient to continue using cane as directed by HHPT, based on fall risk from objective measures   Person(s) Educated Patient;Spouse   Methods Explanation   Comprehension Verbalized understanding          PT Short Term Goals - 04/13/17 1505      PT SHORT TERM GOAL #1   Title Pt will perform HEP with family supervision, for improved functional mobility/decreased fall risk.  TARGET 05/14/17   Time 5   Period Weeks   Status New   Target Date 05/14/17     PT SHORT TERM GOAL #2   Title Pt will perform at least 6 of 10 reps of sit<>stand transfers with minimal to no UE support, for improved transfer efficiency and safety.   Time 5   Period Weeks   Target Date 05/14/17     PT SHORT TERM GOAL #3   Title Pt will improve Berg Balance score to at least 36/56 for decreased fall risk.   Time 5   Period Weeks   Status New   Target Date 05/14/17     PT SHORT TERM GOAL #4   Title Pt will improve  TUG score to less than or equal to 15 seconds for decreased fall risk.   Time 5   Period Weeks   Status New   Target Date 05/14/17     PT SHORT TERM GOAL #5   Title Pt/husband will verbalize understanding of CVA warning signs and symptoms.   Time 5   Period Weeks   Status New   Target Date 05/14/17           PT Long Term Goals - 04/13/17 2156      PT LONG TERM GOAL #1   Title Pt/husband will verbalize understanding of fall prevention in home environment.  TARGET 06/11/17   Time 9   Period Weeks   Status New   Target Date 06/11/17     PT LONG TERM GOAL #2   Title Pt will improve gait velocity to at least 1.8 ft/sec for decreased fall risk.   Time 9   Period Weeks   Status New   Target Date 06/11/17     PT LONG TERM GOAL #3   Title Pt will improve Berg Balance score to at least 44/56 for decreased fall risk.   Time 9   Period Weeks   Status New   Target Date 06/11/17     PT LONG TERM GOAL #4   Title Pt will improve TUG score to less than or equal to 13.5 seconds for decreased fall risk.   Time 9   Period Weeks   Status New   Target Date 06/11/17     PT LONG TERM GOAL #5   Title Pt will ambulate at least 500 ft, indoor and outdoor surfaces, least restrictive assistive device, modified independently, for improved community gait.   Time 9   Period Weeks   Status New   Target Date 06/11/17     Additional Long  Term Goals   Additional Long Term Goals Yes     PT LONG TERM GOAL #6   Title Pt will report improved Neuro QOL score on FOTO, by at least 10%.   Time 9   Period Weeks   Status New   Target Date 06/11/17                Plan - 05/13/2017 1448    Clinical Impression Statement Pt is a 76 year old female who presents to OP PT with history of L MCA infarct with R spastic hemiplegia, in May 2018.  Pt has had inpatient rehab and home health therapies, and now presents to OP PT.  She presents with decreased muscle strength, decreased balance, decreased  independence and safety with gait, abnormal posture.  Prior to CVA, pt was independent with mobility and all activities.  Pt would benefit from skilled PT to address the above stated deficits to decrease fall risk and improve functional mobility.   History and Personal Factors relevant to plan of care: >3 co-morbidities per PMH, >3 body system involvement   Clinical Presentation Evolving   Clinical Presentation due to: hx of CVA 11/2016, at fall risk per 3 mobility meausres   Clinical Decision Making Moderate   Rehab Potential Good   PT Frequency 2x / week   PT Duration Other (comment)  9 weeks total POC   PT Treatment/Interventions ADLs/Self Care Home Management;Stair training;Gait training;DME Instruction;Functional mobility training;Therapeutic activities;Therapeutic exercise;Balance training;Neuromuscular re-education;Patient/family education;Orthotic Fit/Training   PT Next Visit Plan Initiate HEP to address balance and strengthening; gait training with cane   Consulted and Agree with Plan of Care Patient;Family member/caregiver   Family Member Consulted Husband      Patient will benefit from skilled therapeutic intervention in order to improve the following deficits and impairments:  Abnormal gait, Decreased balance, Decreased mobility, Decreased knowledge of use of DME, Decreased safety awareness, Difficulty walking, Decreased strength, Impaired tone, Postural dysfunction  Visit Diagnosis: Other abnormalities of gait and mobility  Muscle weakness (generalized)  Unsteadiness on feet  Abnormal posture      G-Codes - May 13, 2017 November 09, 2199    Functional Assessment Tool Used (Outpatient Only) gait velocity 1.37 ft/sec, TUG 19.72 sec, Berg 28/56   Functional Limitation Mobility: Walking and moving around   Mobility: Walking and Moving Around Current Status [phone]) At least 60 percent but less than 80 percent impaired, limited or restricted   Mobility: Walking and Moving Around Goal Status  [phone]) At least 20 percent but less than 40 percent impaired, limited or restricted       Problem List Patient Active Problem List   Diagnosis Date Noted  . Bradycardia 04/12/2017  . Depression 01/29/2017  . Fall   . Physical deconditioning   . Aphasia as late effect of cerebrovascular accident (CVA)   . PAF (paroxysmal atrial fibrillation) ([place])   . Chronic diastolic heart failure ([place])   . Dysarthria, post-stroke   . Hypoalbuminemia due to protein-calorie malnutrition ([place])   . Global aphasia   . Encephalopathy 12/23/2016  . Acute on chronic diastolic heart failure ([place])   . Acute blood loss anemia   . Tachypnea   . Toxic encephalopathy 12/22/2016  . Atrial flutter ([place]) 12/22/2016  . Hypokalemia 12/21/2016  . Obesity (BMI 30-39.9) 12/21/2016  . Spastic hemiplegia of right dominant side as late effect of cerebral infarction ([place])   . E-coli UTI   . Anemia of chronic disease   . Benign essential HTN   .  Dysphagia, post-stroke   . Anterior cerebral circulation hemorrhagic infarction ([place]) 12/03/2016  . PAC (premature atrial contraction) 12/03/2016  . Acute ischemic left MCA stroke ([place]) 12/03/2016  . Aphasia as late effect of stroke 12/03/2016  . Right hemiparesis ([place])   . Nontraumatic subcortical hemorrhage of left cerebral hemisphere ([place])   . Acute embolic stroke ([place]) 07/12/4974  . Mild aortic regurgitation 08/20/2016  . Bilateral edema of lower extremity 04/07/2016  . Pancreatic duct dilated 01/09/2016  . CKD (chronic kidney disease) 12/25/2015  . Mild tricuspid regurgitation 12/25/2015  . Mild mitral regurgitation 12/25/2015  . Prediabetes 06/10/2015  . Edema 06/04/2015  . Abdominal pain, chronic, right upper quadrant 06/04/2015  . Chronic low back pain   . PAIN IN THORACIC SPINE 04/18/2010  . Coronary atherosclerosis 01/24/2009  . DEGENERATIVE JOINT DISEASE 01/23/2009  . ARTHRITIS, LEFT KNEE 12/13/2008  . HERNIATED LUMBAR DISC 12/13/2008  . Hypothyroidism  11/21/2007  . Hyperlipidemia 11/09/2007  . ANEMIA 11/09/2007  . Osteopenia 11/09/2007    Leonora Gores W. 04/13/2017, 10:03 PM  Frazier Butt., PT   [facility] [address] [place], [place], [address] Phone: [phone]   Fax:  [phone]  Name: Valerie Mcclure MRN: [id number] Date of Birth: 1941-07-02

## 2017-04-13 NOTE — Therapy (Signed)
[place] [address] [place], [place], [address] Phone: [phone]   Fax:  [phone]  Occupational Therapy Evaluation  Patient Details  Name: ALOHA Mcclure MRN: [id number] Date of Birth: 02-08-1941 Referring Provider: Dr. Billey Gosling  Encounter Date: 04/13/2017      OT End of Session - 04/13/17 1222    Visit Number 1   Number of Visits 17   Date for OT Re-Evaluation 06/12/17   Authorization Type [employer] Medicare, no visit limit, no auth req; G-code needed    Authorization Time Period cert. date: 9/25-11/24/18    Authorization - Visit Number 1   Authorization - Number of Visits 10   OT Start Time 1106   OT Stop Time 1146   OT Time Calculation (min) 40 min   Activity Tolerance Patient tolerated treatment well   Behavior During Therapy WFL for tasks assessed/performed      Past Medical History:  Diagnosis Date  . Blood transfusion without reported diagnosis   . Chronic low back pain   . Hyperlipidemia   . Osteopenia   . Unspecified essential hypertension   . Unspecified hypothyroidism     Past Surgical History:  Procedure Laterality Date  . ABDOMINAL HYSTERECTOMY  1970  . CHOLECYSTECTOMY  07/2009   Dr. Rise Patience  . COLONOSCOPY  2003  . FLEXIBLE SIGMOIDOSCOPY  2010  . HAND SURGERY    . IR ANGIO INTRA EXTRACRAN SEL COM CAROTID INNOMINATE UNI L MOD SED  11/29/2016  . IR ANGIO VERTEBRAL SEL SUBCLAVIAN INNOMINATE UNI R MOD SED  11/29/2016  . IR PERCUTANEOUS ART THROMBECTOMY/INFUSION INTRACRANIAL INC DIAG ANGIO  11/29/2016  . IR RADIOLOGIST EVAL & MGMT  03/24/2017  . LUMBAR LAMINECTOMY  11/2008   Done by Dr. Patrice Paradise  . RADIOLOGY WITH ANESTHESIA N/A 11/29/2016   Procedure: RADIOLOGY WITH ANESTHESIA;  Surgeon: Radiologist, Medication, MD;  Location: [facility];  Service: Radiology;  Laterality: N/A;  . THYROIDECTOMY      There were no vitals filed for this visit.      Subjective Assessment - 04/13/17 1109     Subjective  Pt denies pain   Patient is accompained by: Family member  husband--[name]   Pertinent History CVA 11/29/16 with R hemiparesis, TIA 01/11/17.  PMH includes:  HTN, chronic kidney disease, heart problems (including bradycardia, chronic diastolic heart failure, aterial flutter, regurgitation--see Epic for details), pre-diabetes, hx of chronic low back pain, herinated lumbar disc, DJD, hypothyroidism, hyperlipidemia, osteopenia, depression, encephalopy   Limitations fall risk, no driving, aphasia (receptive)   Patient Stated Goals incr independence with ADLs, improve RUE functional use   Currently in Pain? No/denies           [facility] : [facility] OT Assessment - 04/13/17 1112      Assessment   Diagnosis R hemiparesis due to CVA,   Referring Provider Dr. Billey Gosling   Onset Date 11/29/16   Assessment hospitalized 11/29/16-12/23/16 and then for TIA 01/11/17-01/14/17 per husband   Prior Therapy home health therapies ended last week     Precautions   Precautions Fall;Other (comment)     Balance Screen   Has the patient fallen in the past 6 months No     Home  Environment   Family/patient expects to be discharged to: Private residence   Lives With Spouse  dtr also assists     Prior Function   Level of [place] Enjoyed shopping, going out to eat, artificial flower arrangements  ADL   Eating/Feeding Set up  using L hand (needs A for cutting food/opening containers)   Grooming Maximal assistance  needs A for make-up, set up for teeth, A for earrings   Upper Body Bathing Moderate assistance   Lower Body Bathing Moderate assistance   Upper Body Dressing Minimal assistance  bra and fasteners    Lower Body Dressing Moderate assistance   Toilet Transfer Modified independent   Toileting - Clothing Manipulation Modified independent   Toileting -  Hygiene Modified Independent   Tub/Shower Transfer Minimal assistance   Data processing manager tub bench   hand held shower head     IADL   Prior Level of Function Shopping independent   Shopping --  uses w/c (husband pushes), husband shops   Prior Level of Function Light Housekeeping pt and husband performed   Light Housekeeping --  washes dishes, folded clthes, dusted   Prior Level of Function Meal Prep pt and husband performed   Meal Prep --  pt not performing   Prior Level of Function Community Mobility independent, drove   Education officer, environmental on family or friends for transportation   Prior Level of Function Meal Prep independent   Medication Management Is not capable of dispensing or managing own medication   Prior Level of Function Financial Management independent   Financial Management Dependent  husband performs     Mobility   Mobility Status Comments per PT, using cane is recommended     Written Expression   Dominant Hand Right   Handwriting 25% legible  due to decr coordination/aphasia     Vision - History   Baseline Vision Wears glasses only for reading   Visual History Cataracts   Additional Comments denies changes from CVA     Vision Assessment   Vision Assessment Vision not tested     Cognition   Overall Cognitive Status Difficult to assess  husband/pt deny changes, expressive aphasia   Mini Mental State Exam  --   Difficult to assess due to --  aphasia, particularly expressive; scheduled for ST eval 9/26   Behaviors --  slow processing at times, ? if due to aphasia     Sensation   Light Touch Appears Intact   Additional Comments pt denies numbness     Coordination   Gross Motor Movements are Fluid and Coordinated No   Fine Motor Movements are Fluid and Coordinated No   9 Hole Peg Test Right;Left   Right 9 Hole Peg Test 104.0   Left 9 Hole Peg Test 25.09     Edema   Edema mild in R hand     ROM / Strength   AROM / PROM / Strength AROM;Strength     AROM   Overall AROM  Deficits   Overall AROM Comments RUE approx 90% movement with decr  coordination (limitations noted in finger opposition, full shoulder flex, wrist ext, supination)     Strength   Overall Strength Deficits   Overall Strength Comments L shoulder proximal strength grossly 5/5, RUE proximal strength grossly 3+ to 4-/5  pt with difficulty following directions, needed incr cues     Hand Function   Right Hand Grip (lbs) 17   Left Hand Grip (lbs) 47                         OT Education - 04/13/17 1646    Education provided Yes   Education Details OT eval results,  OT POC   Person(s) Educated Patient;Spouse   Methods Explanation   Comprehension Verbalized understanding          OT Short Term Goals - 04/13/17 1527      OT SHORT TERM GOAL #1   Title Pt will be independent with initial HEP.--check STGs 05/12/17   Time 4   Period Weeks   Status New     OT SHORT TERM GOAL #2   Title Pt will use RUE to eat least 50% of the time.   Time 4   Period Weeks   Status New     OT SHORT TERM GOAL #3   Title Pt will perform grooming tasks with min A.   Time 4   Period Weeks   Status New     OT SHORT TERM GOAL #4   Title Pt will perform bathing with min A.   Time 4   Period Weeks   Status New     OT SHORT TERM GOAL #5   Title Pt will perform LB dressing with min A.   Period Weeks   Status New           OT Long Term Goals - 04/13/17 1636      OT LONG TERM GOAL #1   Title Pt will be indpendent with updated HEP.--check LTGs 06/12/17   Time 8   Period Weeks   Status New     OT LONG TERM GOAL #2   Title Pt will use RUE to eat least 75% of the time.   Time 8   Period Weeks   Status New     OT LONG TERM GOAL #3   Title Pt will perform BADLs with set-up/supervision.   Time 8   Period Weeks   Status New     OT LONG TERM GOAL #4   Title Pt will perform simple cooking task with supervision.   Time 8   Period Weeks   Status New     OT LONG TERM GOAL #5   Title Pt will improve dominant R hand coordination for ADLs as  shown by completing 9-hole peg test in less than 70sec.   Baseline 104 sec   Time 8   Period Weeks   Status New     Long Term Additional Goals   Additional Long Term Goals Yes     OT LONG TERM GOAL #6   Title Pt will improve R grip strength to at least 28lbs to assist with pulling/gripping tasks and opening containers.   Baseline 17lbs   Time 8   Period Weeks   Status New               Plan - 04/13/17 1223    Clinical Impression Statement Pt presents today with R hemiparesis, decr coordination, decr ROM, decr strength, decr balance/functional mobility, cognitive deficits/aphasia.  Pt would benefit from occupational therapy in order to improve ADL/IADL performance, independence, improve dominant RUE functional use, and improve quality of life.   Occupational Profile and client history currently impacting functional performance Pt is a 76 y.o. female s/p CVA with R hemiparesis on 11/29/16.  Pt also hospitalized for "light stroke/TIA" 01/11/17-01/14/17 per husband.  PMH includes:  HTN, chronic kidney disease, heart problems (including bradycardia, chronic diastolic heart failure, aterial flutter, regurgitation--see Epic for details), pre-diabetes, hx of chronic low back pain, herinated lumbar disc, DJD, hypothyroidism, hyperlipidemia, osteopenia, depression, encephalopy.  Pt was independent prior to CVA and enjoyed arranging flowers.  Pt is now limited with ADLs/IADLs and with dominant RUE functional use.  Husband assisted with eval today due to pt's aphasia.   Occupational performance deficits (Please refer to evaluation for details): ADL's;IADL's;Leisure;Social Participation   Rehab Potential Good   Current Impairments/barriers affecting progress: aphasia   OT Frequency 2x / week   OT Duration 8 weeks  +eval   OT Treatment/Interventions Self-care/ADL training;Cryotherapy;Parrafin;Therapeutic exercise;DME and/or AE instruction;Therapist, nutritional;Therapeutic  activities;Patient/family education;Balance training;Cognitive remediation/compensation;Splinting;Manual Therapy;Neuromuscular education;Fluidtherapy;Ultrasound;Electrical Stimulation;Moist Heat;Energy conservation;Contrast Bath;Passive range of motion;Therapeutic exercises   Plan coordination, RUE functional use HEP   Clinical Decision Making Several treatment options, min-mod task modification necessary   Consulted and Agree with Plan of Care Patient      Patient will benefit from skilled therapeutic intervention in order to improve the following deficits and impairments:  Decreased balance, Decreased mobility, Impaired tone, Impaired perceived functional ability, Decreased range of motion, Decreased cognition, Decreased coordination, Decreased knowledge of use of DME, Decreased strength, Impaired UE functional use  Visit Diagnosis: Hemiplegia and hemiparesis following cerebral infarction affecting right dominant side ([name])  Other lack of coordination  Unsteadiness on feet  Other abnormalities of gait and mobility  Other symptoms and signs involving the nervous system  Other symptoms and signs involving cognitive functions following cerebral infarction      G-Codes - 2017-05-05 1647    Functional Assessment Tool Used (Outpatient only) 9-hole peg test:  104sec.  R grip strength:  17lbs.  Not using RUE to eat with or perform grooming tasks.   Functional Limitation Carrying, moving and handling objects   Carrying, Moving and Handling Objects Current Status [phone]) At least 60 percent but less than 80 percent impaired, limited or restricted   Carrying, Moving and Handling Objects Goal Status ([id number]) At least 20 percent but less than 40 percent impaired, limited or restricted      Problem List Patient Active Problem List   Diagnosis Date Noted  . Bradycardia 04/12/2017  . Depression 01/29/2017  . Fall   . Physical deconditioning   . Aphasia as late effect of cerebrovascular accident  (CVA)   . PAF (paroxysmal atrial fibrillation) ([place])   . Chronic diastolic heart failure ([place])   . Dysarthria, post-stroke   . Hypoalbuminemia due to protein-calorie malnutrition ([place])   . Global aphasia   . Encephalopathy 12/23/2016  . Acute on chronic diastolic heart failure ([place])   . Acute blood loss anemia   . Tachypnea   . Toxic encephalopathy 12/22/2016  . Atrial flutter ([place]) 12/22/2016  . Hypokalemia 12/21/2016  . Obesity (BMI 30-39.9) 12/21/2016  . Spastic hemiplegia of right dominant side as late effect of cerebral infarction ([place])   . E-coli UTI   . Anemia of chronic disease   . Benign essential HTN   . Dysphagia, post-stroke   . Anterior cerebral circulation hemorrhagic infarction ([place]) 12/03/2016  . PAC (premature atrial contraction) 12/03/2016  . Acute ischemic left MCA stroke ([place]) 12/03/2016  . Aphasia as late effect of stroke 12/03/2016  . Right hemiparesis ([place])   . Nontraumatic subcortical hemorrhage of left cerebral hemisphere ([place])   . Acute embolic stroke ([place]) 62/69/4854  . Mild aortic regurgitation 08/20/2016  . Bilateral edema of lower extremity 04/07/2016  . Pancreatic duct dilated 01/09/2016  . CKD (chronic kidney disease) 12/25/2015  . Mild tricuspid regurgitation 12/25/2015  . Mild mitral regurgitation 12/25/2015  . Prediabetes 06/10/2015  . Edema 06/04/2015  . Abdominal pain, chronic, right upper quadrant 06/04/2015  . Chronic low back  pain   . PAIN IN THORACIC SPINE 04/18/2010  . Coronary atherosclerosis 01/24/2009  . DEGENERATIVE JOINT DISEASE 01/23/2009  . ARTHRITIS, LEFT KNEE 12/13/2008  . HERNIATED LUMBAR DISC 12/13/2008  . Hypothyroidism 11/21/2007  . Hyperlipidemia 11/09/2007  . ANEMIA 11/09/2007  . Osteopenia 11/09/2007    [facility] 04/13/2017, 4:53 PM  [place] [address] [place], [place], [address] Phone: [phone]   Fax:  [phone]  Name: Valerie Mcclure MRN: [id number] Date of Birth: 1940/10/02   Vianne Bulls, OTR/L [facility] [address] [place], [place]  [address] [phone] phone [phone] 04/13/17 4:53 PM

## 2017-04-14 ENCOUNTER — Ambulatory Visit: Payer: Medicare Other

## 2017-04-14 ENCOUNTER — Ambulatory Visit: Payer: Medicare Other | Admitting: Physical Therapy

## 2017-04-14 ENCOUNTER — Encounter: Payer: Medicare Other | Admitting: Occupational Therapy

## 2017-04-14 DIAGNOSIS — I69390 Apraxia following cerebral infarction: Secondary | ICD-10-CM | POA: Diagnosis not present

## 2017-04-14 DIAGNOSIS — I4891 Unspecified atrial fibrillation: Secondary | ICD-10-CM | POA: Diagnosis not present

## 2017-04-14 DIAGNOSIS — R7303 Prediabetes: Secondary | ICD-10-CM | POA: Diagnosis not present

## 2017-04-14 DIAGNOSIS — Z7901 Long term (current) use of anticoagulants: Secondary | ICD-10-CM | POA: Diagnosis not present

## 2017-04-14 DIAGNOSIS — I69351 Hemiplegia and hemiparesis following cerebral infarction affecting right dominant side: Secondary | ICD-10-CM | POA: Diagnosis not present

## 2017-04-14 DIAGNOSIS — I130 Hypertensive heart and chronic kidney disease with heart failure and stage 1 through stage 4 chronic kidney disease, or unspecified chronic kidney disease: Secondary | ICD-10-CM | POA: Diagnosis not present

## 2017-04-14 DIAGNOSIS — D631 Anemia in chronic kidney disease: Secondary | ICD-10-CM | POA: Diagnosis not present

## 2017-04-14 DIAGNOSIS — I69391 Dysphagia following cerebral infarction: Secondary | ICD-10-CM | POA: Diagnosis not present

## 2017-04-14 DIAGNOSIS — I5030 Unspecified diastolic (congestive) heart failure: Secondary | ICD-10-CM | POA: Diagnosis not present

## 2017-04-14 DIAGNOSIS — R1310 Dysphagia, unspecified: Secondary | ICD-10-CM | POA: Diagnosis not present

## 2017-04-14 DIAGNOSIS — I69320 Aphasia following cerebral infarction: Secondary | ICD-10-CM | POA: Diagnosis not present

## 2017-04-14 DIAGNOSIS — Z993 Dependence on wheelchair: Secondary | ICD-10-CM | POA: Diagnosis not present

## 2017-04-14 DIAGNOSIS — N183 Chronic kidney disease, stage 3 (moderate): Secondary | ICD-10-CM | POA: Diagnosis not present

## 2017-04-14 NOTE — Telephone Encounter (Signed)
LVM with Tye [place] giving verbal orders per MD

## 2017-04-19 ENCOUNTER — Telehealth: Payer: Self-pay | Admitting: Emergency Medicine

## 2017-04-19 DIAGNOSIS — I69351 Hemiplegia and hemiparesis following cerebral infarction affecting right dominant side: Secondary | ICD-10-CM | POA: Diagnosis not present

## 2017-04-19 DIAGNOSIS — I69390 Apraxia following cerebral infarction: Secondary | ICD-10-CM | POA: Diagnosis not present

## 2017-04-19 DIAGNOSIS — I69391 Dysphagia following cerebral infarction: Secondary | ICD-10-CM | POA: Diagnosis not present

## 2017-04-19 DIAGNOSIS — R7303 Prediabetes: Secondary | ICD-10-CM | POA: Diagnosis not present

## 2017-04-19 DIAGNOSIS — N183 Chronic kidney disease, stage 3 (moderate): Secondary | ICD-10-CM | POA: Diagnosis not present

## 2017-04-19 DIAGNOSIS — Z993 Dependence on wheelchair: Secondary | ICD-10-CM | POA: Diagnosis not present

## 2017-04-19 DIAGNOSIS — I5030 Unspecified diastolic (congestive) heart failure: Secondary | ICD-10-CM | POA: Diagnosis not present

## 2017-04-19 DIAGNOSIS — I4891 Unspecified atrial fibrillation: Secondary | ICD-10-CM | POA: Diagnosis not present

## 2017-04-19 DIAGNOSIS — D631 Anemia in chronic kidney disease: Secondary | ICD-10-CM | POA: Diagnosis not present

## 2017-04-19 DIAGNOSIS — I130 Hypertensive heart and chronic kidney disease with heart failure and stage 1 through stage 4 chronic kidney disease, or unspecified chronic kidney disease: Secondary | ICD-10-CM | POA: Diagnosis not present

## 2017-04-19 DIAGNOSIS — I69320 Aphasia following cerebral infarction: Secondary | ICD-10-CM | POA: Diagnosis not present

## 2017-04-19 DIAGNOSIS — Z7901 Long term (current) use of anticoagulants: Secondary | ICD-10-CM | POA: Diagnosis not present

## 2017-04-19 DIAGNOSIS — R1310 Dysphagia, unspecified: Secondary | ICD-10-CM | POA: Diagnosis not present

## 2017-04-19 MED ORDER — METOPROLOL TARTRATE 25 MG PO TABS: 12.5000 mg | ORAL_TABLET | Freq: Two times a day (BID) | ORAL | 1 refills | Status: DC

## 2017-04-19 NOTE — Telephone Encounter (Signed)
What is her blood pressure?

## 2017-04-19 NOTE — Telephone Encounter (Signed)
Decrease metoprolol to 12. 5 mg twice daily ( 1/2 pill twice daily).     Call back in a few days or one week with HR and BP, sooner if concern

## 2017-04-19 NOTE — Telephone Encounter (Signed)
LVM for Juliann Pulse to call back and discuss.

## 2017-04-19 NOTE — Telephone Encounter (Signed)
BP was 126/76 today, asymptomatic. HR dropped even more once cutting the BP med in half and starting hydralazine.

## 2017-04-19 NOTE — Telephone Encounter (Signed)
LVM with pts husband to inform. Spoke with Juliann Pulse to give MDs response.

## 2017-04-19 NOTE — Telephone Encounter (Signed)
[facility] called and stated when patients blood pressure medicine was cut in half her heart rate has dropped. Her heart rate today is 48. Just wanted your to be aware and to give her a call back. Thanks.

## 2017-04-20 ENCOUNTER — Ambulatory Visit: Payer: Medicare Other | Admitting: Physical Therapy

## 2017-04-20 ENCOUNTER — Telehealth: Payer: Self-pay | Admitting: Internal Medicine

## 2017-04-20 DIAGNOSIS — I69391 Dysphagia following cerebral infarction: Secondary | ICD-10-CM | POA: Diagnosis not present

## 2017-04-20 DIAGNOSIS — Z993 Dependence on wheelchair: Secondary | ICD-10-CM | POA: Diagnosis not present

## 2017-04-20 DIAGNOSIS — N183 Chronic kidney disease, stage 3 (moderate): Secondary | ICD-10-CM | POA: Diagnosis not present

## 2017-04-20 DIAGNOSIS — I69320 Aphasia following cerebral infarction: Secondary | ICD-10-CM | POA: Diagnosis not present

## 2017-04-20 DIAGNOSIS — I4891 Unspecified atrial fibrillation: Secondary | ICD-10-CM | POA: Diagnosis not present

## 2017-04-20 DIAGNOSIS — D631 Anemia in chronic kidney disease: Secondary | ICD-10-CM | POA: Diagnosis not present

## 2017-04-20 DIAGNOSIS — Z7901 Long term (current) use of anticoagulants: Secondary | ICD-10-CM | POA: Diagnosis not present

## 2017-04-20 DIAGNOSIS — R7303 Prediabetes: Secondary | ICD-10-CM | POA: Diagnosis not present

## 2017-04-20 DIAGNOSIS — I69390 Apraxia following cerebral infarction: Secondary | ICD-10-CM | POA: Diagnosis not present

## 2017-04-20 DIAGNOSIS — I69351 Hemiplegia and hemiparesis following cerebral infarction affecting right dominant side: Secondary | ICD-10-CM | POA: Diagnosis not present

## 2017-04-20 DIAGNOSIS — R1310 Dysphagia, unspecified: Secondary | ICD-10-CM | POA: Diagnosis not present

## 2017-04-20 DIAGNOSIS — I5030 Unspecified diastolic (congestive) heart failure: Secondary | ICD-10-CM | POA: Diagnosis not present

## 2017-04-20 DIAGNOSIS — I130 Hypertensive heart and chronic kidney disease with heart failure and stage 1 through stage 4 chronic kidney disease, or unspecified chronic kidney disease: Secondary | ICD-10-CM | POA: Diagnosis not present

## 2017-04-20 NOTE — Telephone Encounter (Signed)
Requesting verbal for OT for two times a week for three weeks.

## 2017-04-20 NOTE — Telephone Encounter (Signed)
LVM giving verbal orders per MD.

## 2017-04-21 ENCOUNTER — Encounter: Payer: Self-pay | Admitting: Physical Medicine & Rehabilitation

## 2017-04-21 ENCOUNTER — Encounter: Payer: Medicare Other | Attending: Physical Medicine & Rehabilitation | Admitting: Physical Medicine & Rehabilitation

## 2017-04-21 VITALS — BP 123/80 | HR 58

## 2017-04-21 DIAGNOSIS — I69393 Ataxia following cerebral infarction: Secondary | ICD-10-CM | POA: Insufficient documentation

## 2017-04-21 DIAGNOSIS — I69322 Dysarthria following cerebral infarction: Secondary | ICD-10-CM

## 2017-04-21 DIAGNOSIS — E876 Hypokalemia: Secondary | ICD-10-CM | POA: Insufficient documentation

## 2017-04-21 DIAGNOSIS — I69320 Aphasia following cerebral infarction: Secondary | ICD-10-CM | POA: Diagnosis not present

## 2017-04-21 DIAGNOSIS — G8191 Hemiplegia, unspecified affecting right dominant side: Secondary | ICD-10-CM | POA: Diagnosis not present

## 2017-04-21 DIAGNOSIS — M545 Low back pain: Secondary | ICD-10-CM | POA: Insufficient documentation

## 2017-04-21 DIAGNOSIS — E785 Hyperlipidemia, unspecified: Secondary | ICD-10-CM | POA: Insufficient documentation

## 2017-04-21 DIAGNOSIS — E039 Hypothyroidism, unspecified: Secondary | ICD-10-CM | POA: Diagnosis not present

## 2017-04-21 DIAGNOSIS — Z801 Family history of malignant neoplasm of trachea, bronchus and lung: Secondary | ICD-10-CM | POA: Diagnosis not present

## 2017-04-21 DIAGNOSIS — I69390 Apraxia following cerebral infarction: Secondary | ICD-10-CM | POA: Diagnosis not present

## 2017-04-21 DIAGNOSIS — N189 Chronic kidney disease, unspecified: Secondary | ICD-10-CM | POA: Diagnosis not present

## 2017-04-21 DIAGNOSIS — G8929 Other chronic pain: Secondary | ICD-10-CM | POA: Diagnosis not present

## 2017-04-21 DIAGNOSIS — R1310 Dysphagia, unspecified: Secondary | ICD-10-CM | POA: Insufficient documentation

## 2017-04-21 DIAGNOSIS — Z8249 Family history of ischemic heart disease and other diseases of the circulatory system: Secondary | ICD-10-CM | POA: Insufficient documentation

## 2017-04-21 DIAGNOSIS — R269 Unspecified abnormalities of gait and mobility: Secondary | ICD-10-CM | POA: Insufficient documentation

## 2017-04-21 DIAGNOSIS — I4891 Unspecified atrial fibrillation: Secondary | ICD-10-CM | POA: Insufficient documentation

## 2017-04-21 DIAGNOSIS — I129 Hypertensive chronic kidney disease with stage 1 through stage 4 chronic kidney disease, or unspecified chronic kidney disease: Secondary | ICD-10-CM | POA: Insufficient documentation

## 2017-04-21 DIAGNOSIS — M8580 Other specified disorders of bone density and structure, unspecified site: Secondary | ICD-10-CM | POA: Insufficient documentation

## 2017-04-21 NOTE — Progress Notes (Signed)
Subjective:    Patient ID: Valerie Mcclure, female    DOB: 01/22/1941, 76 y.o.   MRN: [id number]  HPI  76 y.o. female with history of chronic LBP, CKD, L-MCA infarct s/p revascularization with hemorrhagic transformation with resultant right sided weakness, aphasia and inattention and new diagnosis of AFib presents for follow up for left CVA.    Last clinic visit, 02/12/17.  Husband present, who supplements history. Since that time, she continues to receive therapies.  She denies difficulty swallowing.  BP is controlled. She obtained lab work, which was stable.  She uses a cane occasionally.  Denies falls.    Pain Inventory Average Pain 3 Pain Right Now 3 My pain is constant and aching  In the last 24 hours, has pain interfered with the following? General activity 6 Relation with others 6 Enjoyment of life 5 What TIME of day is your pain at its worst? morning Sleep (in general) Good  Pain is worse with: walking Pain improves with: na Relief from Meds: na  Mobility walk with assistance use a cane how many minutes can you walk? 4 ability to climb steps?  no do you drive?  no use a wheelchair needs help with transfers  Function retired I need assistance with the following:  dressing, bathing, toileting, meal prep, household duties and shopping  Neuro/Psych trouble walking confusion anxiety  Prior Studies Any changes since last visit?  no  Physicians involved in your care Any changes since last visit?  no   Family History  Problem Relation Age of Onset  . Heart disease Father 88       MI age 19s  . Lung cancer Brother 40  . Colon cancer Neg Hx   . Esophageal cancer Neg Hx   . Rectal cancer Neg Hx   . Stomach cancer Neg Hx    Social History   Social History  . Marital status: Married    Spouse name: N/A  . Number of children: N/A  . Years of education: N/A   Social History Main Topics  . Smoking status: Never Smoker  . Smokeless tobacco: Never  Used  . Alcohol use No  . Drug use: No  . Sexual activity: Not Currently   Other Topics Concern  . None   Social History Narrative   Married   Past Surgical History:  Procedure Laterality Date  . ABDOMINAL HYSTERECTOMY  1970  . CHOLECYSTECTOMY  07/2009   Dr. Rise Patience  . COLONOSCOPY  2003  . FLEXIBLE SIGMOIDOSCOPY  2010  . HAND SURGERY    . IR ANGIO INTRA EXTRACRAN SEL COM CAROTID INNOMINATE UNI L MOD SED  11/29/2016  . IR ANGIO VERTEBRAL SEL SUBCLAVIAN INNOMINATE UNI R MOD SED  11/29/2016  . IR PERCUTANEOUS ART THROMBECTOMY/INFUSION INTRACRANIAL INC DIAG ANGIO  11/29/2016  . IR RADIOLOGIST EVAL & MGMT  03/24/2017  . LUMBAR LAMINECTOMY  11/2008   Done by Dr. Patrice Paradise  . RADIOLOGY WITH ANESTHESIA N/A 11/29/2016   Procedure: RADIOLOGY WITH ANESTHESIA;  Surgeon: Radiologist, Medication, MD;  Location: [facility];  Service: Radiology;  Laterality: N/A;  . THYROIDECTOMY     Past Medical History:  Diagnosis Date  . Blood transfusion without reported diagnosis   . Chronic low back pain   . Hyperlipidemia   . Osteopenia   . Unspecified essential hypertension   . Unspecified hypothyroidism    BP 123/80   Pulse (!) 58   SpO2 95%   Opioid Risk Score:   Fall Risk  Score:  `1  Depression screen PHQ 2/9  Depression screen [facility] 2/9 02/12/2017 01/26/2017 04/07/2016 04/03/2014 11/14/2012  Decreased Interest 0 3 0 0 0  Down, Depressed, Hopeless 1 1 0 0 0  PHQ - 2 Score 1 4 0 0 0  Altered sleeping 1 0 - - -  Tired, decreased energy 0 0 - - -  Change in appetite 0 3 - - -  Feeling bad or failure about yourself  0 0 - - -  Trouble concentrating 0 0 - - -  Moving slowly or fidgety/restless 0 3 - - -  Suicidal thoughts 0 0 - - -  PHQ-9 Score 2 10 - - -  Difficult doing work/chores - Very difficult - - -  Some recent data might be hidden   Review of Systems  Constitutional:       Loss  HENT: Negative.   Eyes: Negative.   Respiratory: Negative.   Cardiovascular: Negative.   Gastrointestinal:  Negative.   Endocrine: Negative.   Genitourinary: Negative.   Musculoskeletal: Positive for gait problem.  Skin: Negative.   Allergic/Immunologic: Negative.   Neurological: Positive for speech difficulty and weakness. Negative for numbness.  Hematological: Negative.   Psychiatric/Behavioral: Positive for confusion. The patient is nervous/anxious.   All other systems reviewed and are negative.      Objective:   Physical Exam  Gen: NAD. Vital signs reviewed. Head: Normocephalic and atraumatic.  Eyes: EOMI. No discharge.  Cardiovascular: RRR.  No JVD. Respiratory: Effort normal and breath sounds normal.  GI: Soft. Bowel sounds are normal.   Musculoskeletal: She exhibits no edema or tenderness.  Neurological: She is alert and oriented.  Right facial weakness.  Predominantly expressive aphasia, continues to improve Motor: RUE/RLE 4+/5 with apraxia (improving) Skin: Skin is warm and dry. She is not diaphoretic.  Psychiatric: She has a normal mood and affect. Her behavior is normal.     Assessment & Plan:  76 y.o. female with history of chronic LBP, CKD, L-MCA infarct s/p revascularization with hemorrhagic transformation with resultant right sided weakness, aphasia and inattention and new diagnosis of AFib presents for follow up for left CVA.    1. Decrease mobility, apraxia, decreased balance, aphasia, ataxia secondary to left CVA.  Cont therapies  She states she was released by Neurology  2. Hypokalemia  Labs reviewed, stable  3. Chronic pain  TENS ineffective  Trial tylenol  Educated on avoidance of NSAIDs for time being  4. Gait abnormality  Cont cane for safety  Denies falls

## 2017-04-22 ENCOUNTER — Encounter: Payer: Medicare Other | Admitting: Speech Pathology

## 2017-04-22 ENCOUNTER — Ambulatory Visit: Payer: Medicare Other | Admitting: Physical Therapy

## 2017-04-22 DIAGNOSIS — Z993 Dependence on wheelchair: Secondary | ICD-10-CM | POA: Diagnosis not present

## 2017-04-22 DIAGNOSIS — N183 Chronic kidney disease, stage 3 (moderate): Secondary | ICD-10-CM | POA: Diagnosis not present

## 2017-04-22 DIAGNOSIS — I4891 Unspecified atrial fibrillation: Secondary | ICD-10-CM | POA: Diagnosis not present

## 2017-04-22 DIAGNOSIS — I69391 Dysphagia following cerebral infarction: Secondary | ICD-10-CM | POA: Diagnosis not present

## 2017-04-22 DIAGNOSIS — D631 Anemia in chronic kidney disease: Secondary | ICD-10-CM | POA: Diagnosis not present

## 2017-04-22 DIAGNOSIS — I69351 Hemiplegia and hemiparesis following cerebral infarction affecting right dominant side: Secondary | ICD-10-CM | POA: Diagnosis not present

## 2017-04-22 DIAGNOSIS — R7303 Prediabetes: Secondary | ICD-10-CM | POA: Diagnosis not present

## 2017-04-22 DIAGNOSIS — I5030 Unspecified diastolic (congestive) heart failure: Secondary | ICD-10-CM | POA: Diagnosis not present

## 2017-04-22 DIAGNOSIS — Z7901 Long term (current) use of anticoagulants: Secondary | ICD-10-CM | POA: Diagnosis not present

## 2017-04-22 DIAGNOSIS — I69320 Aphasia following cerebral infarction: Secondary | ICD-10-CM | POA: Diagnosis not present

## 2017-04-22 DIAGNOSIS — R1310 Dysphagia, unspecified: Secondary | ICD-10-CM | POA: Diagnosis not present

## 2017-04-22 DIAGNOSIS — I69390 Apraxia following cerebral infarction: Secondary | ICD-10-CM | POA: Diagnosis not present

## 2017-04-22 DIAGNOSIS — I130 Hypertensive heart and chronic kidney disease with heart failure and stage 1 through stage 4 chronic kidney disease, or unspecified chronic kidney disease: Secondary | ICD-10-CM | POA: Diagnosis not present

## 2017-04-23 DIAGNOSIS — I69391 Dysphagia following cerebral infarction: Secondary | ICD-10-CM | POA: Diagnosis not present

## 2017-04-23 DIAGNOSIS — D631 Anemia in chronic kidney disease: Secondary | ICD-10-CM | POA: Diagnosis not present

## 2017-04-23 DIAGNOSIS — R1310 Dysphagia, unspecified: Secondary | ICD-10-CM | POA: Diagnosis not present

## 2017-04-23 DIAGNOSIS — Z993 Dependence on wheelchair: Secondary | ICD-10-CM | POA: Diagnosis not present

## 2017-04-23 DIAGNOSIS — I4891 Unspecified atrial fibrillation: Secondary | ICD-10-CM | POA: Diagnosis not present

## 2017-04-23 DIAGNOSIS — Z7901 Long term (current) use of anticoagulants: Secondary | ICD-10-CM | POA: Diagnosis not present

## 2017-04-23 DIAGNOSIS — N183 Chronic kidney disease, stage 3 (moderate): Secondary | ICD-10-CM | POA: Diagnosis not present

## 2017-04-23 DIAGNOSIS — I69320 Aphasia following cerebral infarction: Secondary | ICD-10-CM | POA: Diagnosis not present

## 2017-04-23 DIAGNOSIS — I69390 Apraxia following cerebral infarction: Secondary | ICD-10-CM | POA: Diagnosis not present

## 2017-04-23 DIAGNOSIS — I130 Hypertensive heart and chronic kidney disease with heart failure and stage 1 through stage 4 chronic kidney disease, or unspecified chronic kidney disease: Secondary | ICD-10-CM | POA: Diagnosis not present

## 2017-04-23 DIAGNOSIS — I69351 Hemiplegia and hemiparesis following cerebral infarction affecting right dominant side: Secondary | ICD-10-CM | POA: Diagnosis not present

## 2017-04-23 DIAGNOSIS — I5030 Unspecified diastolic (congestive) heart failure: Secondary | ICD-10-CM | POA: Diagnosis not present

## 2017-04-23 DIAGNOSIS — R7303 Prediabetes: Secondary | ICD-10-CM | POA: Diagnosis not present

## 2017-04-26 DIAGNOSIS — I130 Hypertensive heart and chronic kidney disease with heart failure and stage 1 through stage 4 chronic kidney disease, or unspecified chronic kidney disease: Secondary | ICD-10-CM | POA: Diagnosis not present

## 2017-04-26 DIAGNOSIS — N183 Chronic kidney disease, stage 3 (moderate): Secondary | ICD-10-CM | POA: Diagnosis not present

## 2017-04-26 DIAGNOSIS — I69390 Apraxia following cerebral infarction: Secondary | ICD-10-CM | POA: Diagnosis not present

## 2017-04-26 DIAGNOSIS — R1310 Dysphagia, unspecified: Secondary | ICD-10-CM | POA: Diagnosis not present

## 2017-04-26 DIAGNOSIS — I69391 Dysphagia following cerebral infarction: Secondary | ICD-10-CM | POA: Diagnosis not present

## 2017-04-26 DIAGNOSIS — Z993 Dependence on wheelchair: Secondary | ICD-10-CM | POA: Diagnosis not present

## 2017-04-26 DIAGNOSIS — Z7901 Long term (current) use of anticoagulants: Secondary | ICD-10-CM | POA: Diagnosis not present

## 2017-04-26 DIAGNOSIS — I69351 Hemiplegia and hemiparesis following cerebral infarction affecting right dominant side: Secondary | ICD-10-CM | POA: Diagnosis not present

## 2017-04-26 DIAGNOSIS — I69320 Aphasia following cerebral infarction: Secondary | ICD-10-CM | POA: Diagnosis not present

## 2017-04-26 DIAGNOSIS — I5030 Unspecified diastolic (congestive) heart failure: Secondary | ICD-10-CM | POA: Diagnosis not present

## 2017-04-26 DIAGNOSIS — I4891 Unspecified atrial fibrillation: Secondary | ICD-10-CM | POA: Diagnosis not present

## 2017-04-26 DIAGNOSIS — D631 Anemia in chronic kidney disease: Secondary | ICD-10-CM | POA: Diagnosis not present

## 2017-04-26 DIAGNOSIS — R7303 Prediabetes: Secondary | ICD-10-CM | POA: Diagnosis not present

## 2017-04-27 ENCOUNTER — Encounter: Payer: Medicare Other | Admitting: Occupational Therapy

## 2017-04-27 DIAGNOSIS — I69391 Dysphagia following cerebral infarction: Secondary | ICD-10-CM | POA: Diagnosis not present

## 2017-04-27 DIAGNOSIS — R7303 Prediabetes: Secondary | ICD-10-CM | POA: Diagnosis not present

## 2017-04-27 DIAGNOSIS — Z993 Dependence on wheelchair: Secondary | ICD-10-CM | POA: Diagnosis not present

## 2017-04-27 DIAGNOSIS — I69351 Hemiplegia and hemiparesis following cerebral infarction affecting right dominant side: Secondary | ICD-10-CM | POA: Diagnosis not present

## 2017-04-27 DIAGNOSIS — I130 Hypertensive heart and chronic kidney disease with heart failure and stage 1 through stage 4 chronic kidney disease, or unspecified chronic kidney disease: Secondary | ICD-10-CM | POA: Diagnosis not present

## 2017-04-27 DIAGNOSIS — I5030 Unspecified diastolic (congestive) heart failure: Secondary | ICD-10-CM | POA: Diagnosis not present

## 2017-04-27 DIAGNOSIS — Z7901 Long term (current) use of anticoagulants: Secondary | ICD-10-CM | POA: Diagnosis not present

## 2017-04-27 DIAGNOSIS — N183 Chronic kidney disease, stage 3 (moderate): Secondary | ICD-10-CM | POA: Diagnosis not present

## 2017-04-27 DIAGNOSIS — I4891 Unspecified atrial fibrillation: Secondary | ICD-10-CM | POA: Diagnosis not present

## 2017-04-27 DIAGNOSIS — I69320 Aphasia following cerebral infarction: Secondary | ICD-10-CM | POA: Diagnosis not present

## 2017-04-27 DIAGNOSIS — I69390 Apraxia following cerebral infarction: Secondary | ICD-10-CM | POA: Diagnosis not present

## 2017-04-27 DIAGNOSIS — D631 Anemia in chronic kidney disease: Secondary | ICD-10-CM | POA: Diagnosis not present

## 2017-04-27 DIAGNOSIS — R1310 Dysphagia, unspecified: Secondary | ICD-10-CM | POA: Diagnosis not present

## 2017-04-28 ENCOUNTER — Ambulatory Visit: Payer: Medicare Other | Admitting: Physical Therapy

## 2017-04-28 ENCOUNTER — Encounter: Payer: Medicare Other | Admitting: Speech Pathology

## 2017-04-28 ENCOUNTER — Encounter: Payer: Medicare Other | Admitting: Occupational Therapy

## 2017-04-28 DIAGNOSIS — D631 Anemia in chronic kidney disease: Secondary | ICD-10-CM | POA: Diagnosis not present

## 2017-04-28 DIAGNOSIS — Z993 Dependence on wheelchair: Secondary | ICD-10-CM | POA: Diagnosis not present

## 2017-04-28 DIAGNOSIS — N183 Chronic kidney disease, stage 3 (moderate): Secondary | ICD-10-CM | POA: Diagnosis not present

## 2017-04-28 DIAGNOSIS — R1310 Dysphagia, unspecified: Secondary | ICD-10-CM | POA: Diagnosis not present

## 2017-04-28 DIAGNOSIS — I5030 Unspecified diastolic (congestive) heart failure: Secondary | ICD-10-CM | POA: Diagnosis not present

## 2017-04-28 DIAGNOSIS — I130 Hypertensive heart and chronic kidney disease with heart failure and stage 1 through stage 4 chronic kidney disease, or unspecified chronic kidney disease: Secondary | ICD-10-CM | POA: Diagnosis not present

## 2017-04-28 DIAGNOSIS — I69320 Aphasia following cerebral infarction: Secondary | ICD-10-CM | POA: Diagnosis not present

## 2017-04-28 DIAGNOSIS — R7303 Prediabetes: Secondary | ICD-10-CM | POA: Diagnosis not present

## 2017-04-28 DIAGNOSIS — I69351 Hemiplegia and hemiparesis following cerebral infarction affecting right dominant side: Secondary | ICD-10-CM | POA: Diagnosis not present

## 2017-04-28 DIAGNOSIS — I69390 Apraxia following cerebral infarction: Secondary | ICD-10-CM | POA: Diagnosis not present

## 2017-04-28 DIAGNOSIS — Z7901 Long term (current) use of anticoagulants: Secondary | ICD-10-CM | POA: Diagnosis not present

## 2017-04-28 DIAGNOSIS — I69391 Dysphagia following cerebral infarction: Secondary | ICD-10-CM | POA: Diagnosis not present

## 2017-04-28 DIAGNOSIS — I4891 Unspecified atrial fibrillation: Secondary | ICD-10-CM | POA: Diagnosis not present

## 2017-04-29 ENCOUNTER — Ambulatory Visit: Payer: Medicare Other | Admitting: Physical Therapy

## 2017-04-29 DIAGNOSIS — R7303 Prediabetes: Secondary | ICD-10-CM | POA: Diagnosis not present

## 2017-04-29 DIAGNOSIS — Z993 Dependence on wheelchair: Secondary | ICD-10-CM | POA: Diagnosis not present

## 2017-04-29 DIAGNOSIS — I5030 Unspecified diastolic (congestive) heart failure: Secondary | ICD-10-CM | POA: Diagnosis not present

## 2017-04-29 DIAGNOSIS — I69390 Apraxia following cerebral infarction: Secondary | ICD-10-CM | POA: Diagnosis not present

## 2017-04-29 DIAGNOSIS — R1310 Dysphagia, unspecified: Secondary | ICD-10-CM | POA: Diagnosis not present

## 2017-04-29 DIAGNOSIS — I130 Hypertensive heart and chronic kidney disease with heart failure and stage 1 through stage 4 chronic kidney disease, or unspecified chronic kidney disease: Secondary | ICD-10-CM | POA: Diagnosis not present

## 2017-04-29 DIAGNOSIS — N183 Chronic kidney disease, stage 3 (moderate): Secondary | ICD-10-CM | POA: Diagnosis not present

## 2017-04-29 DIAGNOSIS — I69351 Hemiplegia and hemiparesis following cerebral infarction affecting right dominant side: Secondary | ICD-10-CM | POA: Diagnosis not present

## 2017-04-29 DIAGNOSIS — I69391 Dysphagia following cerebral infarction: Secondary | ICD-10-CM | POA: Diagnosis not present

## 2017-04-29 DIAGNOSIS — Z7901 Long term (current) use of anticoagulants: Secondary | ICD-10-CM | POA: Diagnosis not present

## 2017-04-29 DIAGNOSIS — I4891 Unspecified atrial fibrillation: Secondary | ICD-10-CM | POA: Diagnosis not present

## 2017-04-29 DIAGNOSIS — I69320 Aphasia following cerebral infarction: Secondary | ICD-10-CM | POA: Diagnosis not present

## 2017-04-29 DIAGNOSIS — D631 Anemia in chronic kidney disease: Secondary | ICD-10-CM | POA: Diagnosis not present

## 2017-05-02 NOTE — Progress Notes (Deleted)
Subjective:    Patient ID: Valerie Mcclure, female    DOB: 1941-02-22, 76 y.o.   MRN: [id number]  HPI The patient is here for follow up.  CAD, Hypertension: She is taking her medication daily. She is compliant with a low sodium diet.  She denies chest pain, palpitations, edema, shortness of breath and regular headaches. She is exercising regularly.  She does not monitor her blood pressure at home.    Hypothyroidism:  She is taking her medication daily.  She denies any recent changes in energy or weight that are unexplained.   H/o CVA:  CKD:    Prediabetes:  She is compliant with a low sugar/carbohydrate diet.  She is exercising regularly.  Depression: She is taking her medication daily as prescribed. She denies any side effects from the medication. She feels her depression is well controlled and she is happy with her current dose of medication.      Medications and allergies reviewed with patient and updated if appropriate.  Patient Active Problem List   Diagnosis Date Noted  . Bradycardia 04/12/2017  . Depression 01/29/2017  . Fall   . Physical deconditioning   . Aphasia as late effect of cerebrovascular accident (CVA)   . PAF (paroxysmal atrial fibrillation) ([name])   . Chronic diastolic heart failure ([place])   . Dysarthria, post-stroke   . Hypoalbuminemia due to protein-calorie malnutrition ([place])   . Global aphasia   . Encephalopathy 12/23/2016  . Acute on chronic diastolic heart failure ([place])   . Acute blood loss anemia   . Tachypnea   . Toxic encephalopathy 12/22/2016  . Atrial flutter ([place]) 12/22/2016  . Hypokalemia 12/21/2016  . Obesity (BMI 30-39.9) 12/21/2016  . Spastic hemiplegia of right dominant side as late effect of cerebral infarction ([place])   . E-coli UTI   . Anemia of chronic disease   . Benign essential HTN   . Dysphagia, post-stroke   . Anterior cerebral circulation hemorrhagic infarction 12/03/2016  . PAC (premature atrial contraction)  12/03/2016  . Acute ischemic left MCA stroke ([place]) 12/03/2016  . Aphasia as late effect of stroke 12/03/2016  . Right hemiparesis ([place])   . Nontraumatic subcortical hemorrhage of left cerebral hemisphere ([place])   . Acute embolic stroke ([place]) 64/40/3474  . Mild aortic regurgitation 08/20/2016  . Bilateral edema of lower extremity 04/07/2016  . Pancreatic duct dilated 01/09/2016  . CKD (chronic kidney disease) 12/25/2015  . Mild tricuspid regurgitation 12/25/2015  . Mild mitral regurgitation 12/25/2015  . Prediabetes 06/10/2015  . Edema 06/04/2015  . Abdominal pain, chronic, right upper quadrant 06/04/2015  . Chronic low back pain   . PAIN IN THORACIC SPINE 04/18/2010  . Coronary atherosclerosis 01/24/2009  . DEGENERATIVE JOINT DISEASE 01/23/2009  . ARTHRITIS, LEFT KNEE 12/13/2008  . HERNIATED LUMBAR DISC 12/13/2008  . Hypothyroidism 11/21/2007  . Hyperlipidemia 11/09/2007  . ANEMIA 11/09/2007  . Osteopenia 11/09/2007    Current Outpatient Prescriptions on File Prior to Visit  Medication Sig Dispense Refill  . amLODipine (NORVASC) 10 MG tablet Take 0.5 tablets (5 mg total) by mouth daily. 45 tablet 1  . apixaban (ELIQUIS) 5 MG TABS tablet Take 1 tablet (5 mg total) by mouth 2 (two) times daily. 60 tablet 5  . atorvastatin (LIPITOR) 10 MG tablet Take 0.5 tablets (5 mg total) by mouth daily at 6 PM. 45 tablet 1  . Calcium Carbonate-Vit D-Min (CALCIUM 1200 PO) Take 1 tablet by mouth daily.     . Cholecalciferol (VITAMIN  D3) 1000 UNITS CAPS Take 1,000 Units by mouth daily.     Marland Kitchen FLUoxetine (PROZAC) 20 MG capsule Take 20 mg by mouth daily.    . hydrALAZINE (APRESOLINE) 25 MG tablet Take 1 tablet (25 mg total) by mouth 3 (three) times daily. 90 tablet 2  . levothyroxine (SYNTHROID, LEVOTHROID) 88 MCG tablet Take 1 tablet (88 mcg total) by mouth daily. 90 tablet 1  . Maltodextrin-Xanthan Gum (RESOURCE THICKENUP CLEAR) POWD Use as needed to get liquid to nectar thick consistency 3 Can 1  .  metoprolol tartrate (LOPRESSOR) 25 MG tablet Take 0.5 tablets (12.5 mg total) by mouth 2 (two) times daily. 90 tablet 1  . nortriptyline (PAMELOR) 10 MG capsule TAKE 2 CAPSULES BY MOUTH AT BEDTIME 180 capsule 1  . polycarbophil (FIBERCON) 625 MG tablet Take 2 tablets (1,250 mg total) by mouth daily. 60 tablet 0  . potassium chloride SA (K-DUR,KLOR-CON) 20 MEQ tablet Take 1 tablet (20 mEq total) by mouth once. 90 tablet 1  . traMADol (ULTRAM) 50 MG tablet Take 1 tablet (50 mg total) by mouth every 8 (eight) hours as needed. 60 tablet 0  . vitamin B-12 (CYANOCOBALAMIN) 100 MCG tablet Take 100 mcg by mouth daily.     No current facility-administered medications on file prior to visit.     Past Medical History:  Diagnosis Date  . Blood transfusion without reported diagnosis   . Chronic low back pain   . Hyperlipidemia   . Osteopenia   . Unspecified essential hypertension   . Unspecified hypothyroidism     Past Surgical History:  Procedure Laterality Date  . ABDOMINAL HYSTERECTOMY  1970  . CHOLECYSTECTOMY  07/2009   Dr. Rise Patience  . COLONOSCOPY  2003  . FLEXIBLE SIGMOIDOSCOPY  2010  . HAND SURGERY    . IR ANGIO INTRA EXTRACRAN SEL COM CAROTID INNOMINATE UNI L MOD SED  11/29/2016  . IR ANGIO VERTEBRAL SEL SUBCLAVIAN INNOMINATE UNI R MOD SED  11/29/2016  . IR PERCUTANEOUS ART THROMBECTOMY/INFUSION INTRACRANIAL INC DIAG ANGIO  11/29/2016  . IR RADIOLOGIST EVAL & MGMT  03/24/2017  . LUMBAR LAMINECTOMY  11/2008   Done by Dr. Patrice Paradise  . RADIOLOGY WITH ANESTHESIA N/A 11/29/2016   Procedure: RADIOLOGY WITH ANESTHESIA;  Surgeon: Radiologist, Medication, MD;  Location: [facility];  Service: Radiology;  Laterality: N/A;  . THYROIDECTOMY      Social History   Social History  . Marital status: Married    Spouse name: N/A  . Number of children: N/A  . Years of education: N/A   Social History Main Topics  . Smoking status: Never Smoker  . Smokeless tobacco: Never Used  . Alcohol use No  . Drug use:  No  . Sexual activity: Not Currently   Other Topics Concern  . Not on file   Social History Narrative   Married    Family History  Problem Relation Age of Onset  . Heart disease Father 47       MI age 8s  . Lung cancer Brother 38  . Colon cancer Neg Hx   . Esophageal cancer Neg Hx   . Rectal cancer Neg Hx   . Stomach cancer Neg Hx     Review of Systems     Objective:  There were no vitals filed for this visit. Wt Readings from Last 3 Encounters:  04/12/17 153 lb (69.4 kg)  01/29/17 152 lb (68.9 kg)  01/19/17 156 lb 4.8 oz (70.9 kg)   There is  no height or weight on file to calculate BMI.   Physical Exam    Constitutional: Appears well-developed and well-nourished. No distress.  HENT:  Head: Normocephalic and atraumatic.  Neck: Neck supple. No tracheal deviation present. No thyromegaly present.  No cervical lymphadenopathy Cardiovascular: Normal rate, regular rhythm and normal heart sounds.   No murmur heard. No carotid bruit .  No edema Pulmonary/Chest: Effort normal and breath sounds normal. No respiratory distress. No has no wheezes. No rales.  Skin: Skin is warm and dry. Not diaphoretic.  Psychiatric: Normal mood and affect. Behavior is normal.      Assessment & Plan:    See Problem List for Assessment and Plan of chronic medical problems.

## 2017-05-03 ENCOUNTER — Telehealth: Payer: Self-pay

## 2017-05-03 ENCOUNTER — Ambulatory Visit: Payer: Self-pay | Admitting: Neurology

## 2017-05-03 ENCOUNTER — Ambulatory Visit: Payer: Medicare Other | Admitting: Physical Therapy

## 2017-05-03 ENCOUNTER — Encounter: Payer: Medicare Other | Admitting: Speech Pathology

## 2017-05-03 ENCOUNTER — Encounter: Payer: Medicare Other | Admitting: Occupational Therapy

## 2017-05-03 NOTE — Telephone Encounter (Signed)
Patient no showed her appointment 05/03/17 with Dr. Erlinda Hong.

## 2017-05-04 ENCOUNTER — Encounter: Payer: Self-pay | Admitting: Neurology

## 2017-05-04 DIAGNOSIS — D631 Anemia in chronic kidney disease: Secondary | ICD-10-CM | POA: Diagnosis not present

## 2017-05-04 DIAGNOSIS — I5030 Unspecified diastolic (congestive) heart failure: Secondary | ICD-10-CM | POA: Diagnosis not present

## 2017-05-04 DIAGNOSIS — R7303 Prediabetes: Secondary | ICD-10-CM | POA: Diagnosis not present

## 2017-05-04 DIAGNOSIS — I69390 Apraxia following cerebral infarction: Secondary | ICD-10-CM | POA: Diagnosis not present

## 2017-05-04 DIAGNOSIS — I69351 Hemiplegia and hemiparesis following cerebral infarction affecting right dominant side: Secondary | ICD-10-CM | POA: Diagnosis not present

## 2017-05-04 DIAGNOSIS — R1310 Dysphagia, unspecified: Secondary | ICD-10-CM | POA: Diagnosis not present

## 2017-05-04 DIAGNOSIS — I130 Hypertensive heart and chronic kidney disease with heart failure and stage 1 through stage 4 chronic kidney disease, or unspecified chronic kidney disease: Secondary | ICD-10-CM | POA: Diagnosis not present

## 2017-05-04 DIAGNOSIS — I4891 Unspecified atrial fibrillation: Secondary | ICD-10-CM | POA: Diagnosis not present

## 2017-05-04 DIAGNOSIS — I69391 Dysphagia following cerebral infarction: Secondary | ICD-10-CM | POA: Diagnosis not present

## 2017-05-04 DIAGNOSIS — Z993 Dependence on wheelchair: Secondary | ICD-10-CM | POA: Diagnosis not present

## 2017-05-04 DIAGNOSIS — I69320 Aphasia following cerebral infarction: Secondary | ICD-10-CM | POA: Diagnosis not present

## 2017-05-04 DIAGNOSIS — N183 Chronic kidney disease, stage 3 (moderate): Secondary | ICD-10-CM | POA: Diagnosis not present

## 2017-05-04 DIAGNOSIS — Z7901 Long term (current) use of anticoagulants: Secondary | ICD-10-CM | POA: Diagnosis not present

## 2017-05-05 ENCOUNTER — Ambulatory Visit: Payer: Medicare Other | Admitting: Internal Medicine

## 2017-05-05 DIAGNOSIS — I69390 Apraxia following cerebral infarction: Secondary | ICD-10-CM | POA: Diagnosis not present

## 2017-05-05 DIAGNOSIS — D631 Anemia in chronic kidney disease: Secondary | ICD-10-CM | POA: Diagnosis not present

## 2017-05-05 DIAGNOSIS — R7303 Prediabetes: Secondary | ICD-10-CM | POA: Diagnosis not present

## 2017-05-05 DIAGNOSIS — I130 Hypertensive heart and chronic kidney disease with heart failure and stage 1 through stage 4 chronic kidney disease, or unspecified chronic kidney disease: Secondary | ICD-10-CM | POA: Diagnosis not present

## 2017-05-05 DIAGNOSIS — I5030 Unspecified diastolic (congestive) heart failure: Secondary | ICD-10-CM | POA: Diagnosis not present

## 2017-05-05 DIAGNOSIS — I69351 Hemiplegia and hemiparesis following cerebral infarction affecting right dominant side: Secondary | ICD-10-CM | POA: Diagnosis not present

## 2017-05-05 DIAGNOSIS — I69320 Aphasia following cerebral infarction: Secondary | ICD-10-CM | POA: Diagnosis not present

## 2017-05-05 DIAGNOSIS — R1310 Dysphagia, unspecified: Secondary | ICD-10-CM | POA: Diagnosis not present

## 2017-05-05 DIAGNOSIS — I69391 Dysphagia following cerebral infarction: Secondary | ICD-10-CM | POA: Diagnosis not present

## 2017-05-05 DIAGNOSIS — I4891 Unspecified atrial fibrillation: Secondary | ICD-10-CM | POA: Diagnosis not present

## 2017-05-05 DIAGNOSIS — Z7901 Long term (current) use of anticoagulants: Secondary | ICD-10-CM | POA: Diagnosis not present

## 2017-05-05 DIAGNOSIS — N183 Chronic kidney disease, stage 3 (moderate): Secondary | ICD-10-CM | POA: Diagnosis not present

## 2017-05-05 DIAGNOSIS — Z993 Dependence on wheelchair: Secondary | ICD-10-CM | POA: Diagnosis not present

## 2017-05-06 ENCOUNTER — Telehealth: Payer: Self-pay | Admitting: Internal Medicine

## 2017-05-06 ENCOUNTER — Encounter: Payer: Medicare Other | Admitting: Speech Pathology

## 2017-05-06 ENCOUNTER — Ambulatory Visit: Payer: Medicare Other | Admitting: Physical Therapy

## 2017-05-06 ENCOUNTER — Encounter: Payer: Medicare Other | Admitting: Occupational Therapy

## 2017-05-06 DIAGNOSIS — R1310 Dysphagia, unspecified: Secondary | ICD-10-CM | POA: Diagnosis not present

## 2017-05-06 DIAGNOSIS — I130 Hypertensive heart and chronic kidney disease with heart failure and stage 1 through stage 4 chronic kidney disease, or unspecified chronic kidney disease: Secondary | ICD-10-CM | POA: Diagnosis not present

## 2017-05-06 DIAGNOSIS — I69351 Hemiplegia and hemiparesis following cerebral infarction affecting right dominant side: Secondary | ICD-10-CM | POA: Diagnosis not present

## 2017-05-06 DIAGNOSIS — Z7901 Long term (current) use of anticoagulants: Secondary | ICD-10-CM | POA: Diagnosis not present

## 2017-05-06 DIAGNOSIS — I5030 Unspecified diastolic (congestive) heart failure: Secondary | ICD-10-CM | POA: Diagnosis not present

## 2017-05-06 DIAGNOSIS — D631 Anemia in chronic kidney disease: Secondary | ICD-10-CM | POA: Diagnosis not present

## 2017-05-06 DIAGNOSIS — R7303 Prediabetes: Secondary | ICD-10-CM | POA: Diagnosis not present

## 2017-05-06 DIAGNOSIS — I69391 Dysphagia following cerebral infarction: Secondary | ICD-10-CM | POA: Diagnosis not present

## 2017-05-06 DIAGNOSIS — N183 Chronic kidney disease, stage 3 (moderate): Secondary | ICD-10-CM | POA: Diagnosis not present

## 2017-05-06 DIAGNOSIS — I4891 Unspecified atrial fibrillation: Secondary | ICD-10-CM | POA: Diagnosis not present

## 2017-05-06 DIAGNOSIS — I69390 Apraxia following cerebral infarction: Secondary | ICD-10-CM | POA: Diagnosis not present

## 2017-05-06 DIAGNOSIS — I69320 Aphasia following cerebral infarction: Secondary | ICD-10-CM | POA: Diagnosis not present

## 2017-05-06 DIAGNOSIS — Z993 Dependence on wheelchair: Secondary | ICD-10-CM | POA: Diagnosis not present

## 2017-05-06 NOTE — Telephone Encounter (Signed)
LVM giving verbal orders for Speech per MD

## 2017-05-06 NOTE — Telephone Encounter (Signed)
Speech therapy  2x a week for 4weeks  [phone] - Erin  You may LVM

## 2017-05-07 ENCOUNTER — Telehealth: Payer: Self-pay | Admitting: Internal Medicine

## 2017-05-07 NOTE — Telephone Encounter (Signed)
Would like to recertify patient for PT and OT   OT 2week3 1week1 Pt 2week4

## 2017-05-07 NOTE — Telephone Encounter (Signed)
Spoke with Jim to give verbal orders per MD.  

## 2017-05-08 DIAGNOSIS — I6330 Cerebral infarction due to thrombosis of unspecified cerebral artery: Secondary | ICD-10-CM | POA: Diagnosis not present

## 2017-05-10 ENCOUNTER — Encounter: Payer: Medicare Other | Admitting: Occupational Therapy

## 2017-05-10 ENCOUNTER — Ambulatory Visit: Payer: Medicare Other | Admitting: Physical Therapy

## 2017-05-10 ENCOUNTER — Encounter: Payer: Medicare Other | Admitting: Speech Pathology

## 2017-05-10 DIAGNOSIS — N183 Chronic kidney disease, stage 3 (moderate): Secondary | ICD-10-CM | POA: Diagnosis not present

## 2017-05-10 DIAGNOSIS — I69390 Apraxia following cerebral infarction: Secondary | ICD-10-CM | POA: Diagnosis not present

## 2017-05-10 DIAGNOSIS — I69351 Hemiplegia and hemiparesis following cerebral infarction affecting right dominant side: Secondary | ICD-10-CM | POA: Diagnosis not present

## 2017-05-10 DIAGNOSIS — I130 Hypertensive heart and chronic kidney disease with heart failure and stage 1 through stage 4 chronic kidney disease, or unspecified chronic kidney disease: Secondary | ICD-10-CM | POA: Diagnosis not present

## 2017-05-10 DIAGNOSIS — R7303 Prediabetes: Secondary | ICD-10-CM | POA: Diagnosis not present

## 2017-05-10 DIAGNOSIS — R1310 Dysphagia, unspecified: Secondary | ICD-10-CM | POA: Diagnosis not present

## 2017-05-10 DIAGNOSIS — I69391 Dysphagia following cerebral infarction: Secondary | ICD-10-CM | POA: Diagnosis not present

## 2017-05-10 DIAGNOSIS — Z7901 Long term (current) use of anticoagulants: Secondary | ICD-10-CM | POA: Diagnosis not present

## 2017-05-10 DIAGNOSIS — D631 Anemia in chronic kidney disease: Secondary | ICD-10-CM | POA: Diagnosis not present

## 2017-05-10 DIAGNOSIS — I5030 Unspecified diastolic (congestive) heart failure: Secondary | ICD-10-CM | POA: Diagnosis not present

## 2017-05-10 DIAGNOSIS — I4891 Unspecified atrial fibrillation: Secondary | ICD-10-CM | POA: Diagnosis not present

## 2017-05-10 DIAGNOSIS — Z993 Dependence on wheelchair: Secondary | ICD-10-CM | POA: Diagnosis not present

## 2017-05-10 DIAGNOSIS — I69320 Aphasia following cerebral infarction: Secondary | ICD-10-CM | POA: Diagnosis not present

## 2017-05-11 DIAGNOSIS — R7303 Prediabetes: Secondary | ICD-10-CM | POA: Diagnosis not present

## 2017-05-11 DIAGNOSIS — I69320 Aphasia following cerebral infarction: Secondary | ICD-10-CM | POA: Diagnosis not present

## 2017-05-11 DIAGNOSIS — N183 Chronic kidney disease, stage 3 (moderate): Secondary | ICD-10-CM | POA: Diagnosis not present

## 2017-05-11 DIAGNOSIS — I5030 Unspecified diastolic (congestive) heart failure: Secondary | ICD-10-CM | POA: Diagnosis not present

## 2017-05-11 DIAGNOSIS — Z993 Dependence on wheelchair: Secondary | ICD-10-CM | POA: Diagnosis not present

## 2017-05-11 DIAGNOSIS — I69390 Apraxia following cerebral infarction: Secondary | ICD-10-CM | POA: Diagnosis not present

## 2017-05-11 DIAGNOSIS — R1310 Dysphagia, unspecified: Secondary | ICD-10-CM | POA: Diagnosis not present

## 2017-05-11 DIAGNOSIS — I130 Hypertensive heart and chronic kidney disease with heart failure and stage 1 through stage 4 chronic kidney disease, or unspecified chronic kidney disease: Secondary | ICD-10-CM | POA: Diagnosis not present

## 2017-05-11 DIAGNOSIS — D631 Anemia in chronic kidney disease: Secondary | ICD-10-CM | POA: Diagnosis not present

## 2017-05-11 DIAGNOSIS — I69351 Hemiplegia and hemiparesis following cerebral infarction affecting right dominant side: Secondary | ICD-10-CM | POA: Diagnosis not present

## 2017-05-11 DIAGNOSIS — Z7901 Long term (current) use of anticoagulants: Secondary | ICD-10-CM | POA: Diagnosis not present

## 2017-05-11 DIAGNOSIS — I69391 Dysphagia following cerebral infarction: Secondary | ICD-10-CM | POA: Diagnosis not present

## 2017-05-11 DIAGNOSIS — I4891 Unspecified atrial fibrillation: Secondary | ICD-10-CM | POA: Diagnosis not present

## 2017-05-12 ENCOUNTER — Ambulatory Visit: Payer: Medicare Other | Admitting: Physical Therapy

## 2017-05-12 ENCOUNTER — Ambulatory Visit (INDEPENDENT_AMBULATORY_CARE_PROVIDER_SITE_OTHER): Payer: Medicare Other | Admitting: Internal Medicine

## 2017-05-12 ENCOUNTER — Encounter: Payer: Self-pay | Admitting: Internal Medicine

## 2017-05-12 ENCOUNTER — Encounter: Payer: Medicare Other | Admitting: Occupational Therapy

## 2017-05-12 VITALS — BP 132/72 | HR 74 | Temp 97.8°F | Resp 16 | Wt 156.0 lb

## 2017-05-12 DIAGNOSIS — Z993 Dependence on wheelchair: Secondary | ICD-10-CM | POA: Diagnosis not present

## 2017-05-12 DIAGNOSIS — R7303 Prediabetes: Secondary | ICD-10-CM | POA: Diagnosis not present

## 2017-05-12 DIAGNOSIS — I5030 Unspecified diastolic (congestive) heart failure: Secondary | ICD-10-CM | POA: Diagnosis not present

## 2017-05-12 DIAGNOSIS — I69390 Apraxia following cerebral infarction: Secondary | ICD-10-CM | POA: Diagnosis not present

## 2017-05-12 DIAGNOSIS — I10 Essential (primary) hypertension: Secondary | ICD-10-CM

## 2017-05-12 DIAGNOSIS — D631 Anemia in chronic kidney disease: Secondary | ICD-10-CM | POA: Diagnosis not present

## 2017-05-12 DIAGNOSIS — I69391 Dysphagia following cerebral infarction: Secondary | ICD-10-CM | POA: Diagnosis not present

## 2017-05-12 DIAGNOSIS — M545 Low back pain: Secondary | ICD-10-CM

## 2017-05-12 DIAGNOSIS — I69320 Aphasia following cerebral infarction: Secondary | ICD-10-CM | POA: Diagnosis not present

## 2017-05-12 DIAGNOSIS — I69351 Hemiplegia and hemiparesis following cerebral infarction affecting right dominant side: Secondary | ICD-10-CM | POA: Diagnosis not present

## 2017-05-12 DIAGNOSIS — I480 Paroxysmal atrial fibrillation: Secondary | ICD-10-CM

## 2017-05-12 DIAGNOSIS — N183 Chronic kidney disease, stage 3 (moderate): Secondary | ICD-10-CM | POA: Diagnosis not present

## 2017-05-12 DIAGNOSIS — I4891 Unspecified atrial fibrillation: Secondary | ICD-10-CM | POA: Diagnosis not present

## 2017-05-12 DIAGNOSIS — I2510 Atherosclerotic heart disease of native coronary artery without angina pectoris: Secondary | ICD-10-CM | POA: Diagnosis not present

## 2017-05-12 DIAGNOSIS — I130 Hypertensive heart and chronic kidney disease with heart failure and stage 1 through stage 4 chronic kidney disease, or unspecified chronic kidney disease: Secondary | ICD-10-CM | POA: Diagnosis not present

## 2017-05-12 DIAGNOSIS — R001 Bradycardia, unspecified: Secondary | ICD-10-CM

## 2017-05-12 DIAGNOSIS — I491 Atrial premature depolarization: Secondary | ICD-10-CM | POA: Diagnosis not present

## 2017-05-12 DIAGNOSIS — R1310 Dysphagia, unspecified: Secondary | ICD-10-CM | POA: Diagnosis not present

## 2017-05-12 DIAGNOSIS — Z7901 Long term (current) use of anticoagulants: Secondary | ICD-10-CM | POA: Diagnosis not present

## 2017-05-12 DIAGNOSIS — G8929 Other chronic pain: Secondary | ICD-10-CM

## 2017-05-12 NOTE — Assessment & Plan Note (Signed)
Episode while in hospital In sinus on exam with PACs

## 2017-05-12 NOTE — Assessment & Plan Note (Signed)
Amlodipine decreased at last visit , still with intermittent bradycardia per PT - ? Symptomatic ? Intermittent afib - more likely PAC's Will refer to cardio prior to making further med changes

## 2017-05-12 NOTE — Assessment & Plan Note (Signed)
Was seeing with Dr Myles Rosenthal for pain management - stopped seeing him at the time of her stroke and would like to go back  Was on morphine at the time and it worked Will help get her back to see Dr MetLife Currently on tramadol - not effective

## 2017-05-12 NOTE — Progress Notes (Signed)
Subjective:    Patient ID: Valerie Mcclure, female    DOB: 1941-01-20, 76 y.o.   MRN: [id number]  HPI She is here for an acute visit.   Chronic lower back pain:  It started about 20 years.  She has seen Dr Myles Rosenthal - last saw him in May 2018.  She had a stroke at that time and has been recovering since.  She has pain of 6-7/10 and would like to get back to see Dr MetLife.  He had her on morphine and it was effective.  She is currently on tramadol and it does not help.   The pain is in her left lower back and does not radiate.  She denies numbness/tingling.    Bradycardia:  HR 48-60  and irregular per PT.   She has chronic fatigue and lightheadedness, but is unsure if it is related to the low HR.  She did have an episode of Afib in the hospital when she had the stroke.  She denies chest pain, palpitations, SOB.  She does have some leg edema.     Feels tired and blurriness at times - will see eye doctor.   Medications and allergies reviewed with patient and updated if appropriate.  Patient Active Problem List   Diagnosis Date Noted  . Bradycardia 04/12/2017  . Depression 01/29/2017  . Fall   . Physical deconditioning   . Aphasia as late effect of cerebrovascular accident (CVA)   . PAF (paroxysmal atrial fibrillation) ([place])   . Chronic diastolic heart failure ([place])   . Dysarthria, post-stroke   . Hypoalbuminemia due to protein-calorie malnutrition ([place])   . Global aphasia   . Encephalopathy 12/23/2016  . Acute on chronic diastolic heart failure ([place])   . Acute blood loss anemia   . Tachypnea   . Toxic encephalopathy 12/22/2016  . Atrial flutter ([place]) 12/22/2016  . Hypokalemia 12/21/2016  . Obesity (BMI 30-39.9) 12/21/2016  . Spastic hemiplegia of right dominant side as late effect of cerebral infarction ([place])   . E-coli UTI   . Anemia of chronic disease   . Benign essential HTN   . Dysphagia, post-stroke   . Anterior cerebral circulation hemorrhagic infarction  12/03/2016  . PAC (premature atrial contraction) 12/03/2016  . Acute ischemic left MCA stroke ([place]) 12/03/2016  . Aphasia as late effect of stroke 12/03/2016  . Right hemiparesis ([place])   . Nontraumatic subcortical hemorrhage of left cerebral hemisphere ([place])   . Acute embolic stroke ([place]) 67/20/9470  . Mild aortic regurgitation 08/20/2016  . Bilateral edema of lower extremity 04/07/2016  . Pancreatic duct dilated 01/09/2016  . CKD (chronic kidney disease) 12/25/2015  . Mild tricuspid regurgitation 12/25/2015  . Mild mitral regurgitation 12/25/2015  . Prediabetes 06/10/2015  . Edema 06/04/2015  . Abdominal pain, chronic, right upper quadrant 06/04/2015  . Chronic low back pain   . PAIN IN THORACIC SPINE 04/18/2010  . Coronary atherosclerosis 01/24/2009  . DEGENERATIVE JOINT DISEASE 01/23/2009  . ARTHRITIS, LEFT KNEE 12/13/2008  . HERNIATED LUMBAR DISC 12/13/2008  . Hypothyroidism 11/21/2007  . Hyperlipidemia 11/09/2007  . ANEMIA 11/09/2007  . Osteopenia 11/09/2007    Current Outpatient Prescriptions on File Prior to Visit  Medication Sig Dispense Refill  . amLODipine (NORVASC) 10 MG tablet Take 0.5 tablets (5 mg total) by mouth daily. 45 tablet 1  . apixaban (ELIQUIS) 5 MG TABS tablet Take 1 tablet (5 mg total) by mouth 2 (two) times daily. 60 tablet 5  . atorvastatin (LIPITOR)  10 MG tablet Take 0.5 tablets (5 mg total) by mouth daily at 6 PM. 45 tablet 1  . Calcium Carbonate-Vit D-Min (CALCIUM 1200 PO) Take 1 tablet by mouth daily.     . Cholecalciferol (VITAMIN D3) 1000 UNITS CAPS Take 1,000 Units by mouth daily.     Marland Kitchen FLUoxetine (PROZAC) 20 MG capsule Take 20 mg by mouth daily.    . hydrALAZINE (APRESOLINE) 25 MG tablet Take 1 tablet (25 mg total) by mouth 3 (three) times daily. 90 tablet 2  . levothyroxine (SYNTHROID, LEVOTHROID) 88 MCG tablet Take 1 tablet (88 mcg total) by mouth daily. 90 tablet 1  . Maltodextrin-Xanthan Gum (RESOURCE THICKENUP CLEAR) POWD Use as needed to  get liquid to nectar thick consistency 3 Can 1  . metoprolol tartrate (LOPRESSOR) 25 MG tablet Take 0.5 tablets (12.5 mg total) by mouth 2 (two) times daily. 90 tablet 1  . nortriptyline (PAMELOR) 10 MG capsule TAKE 2 CAPSULES BY MOUTH AT BEDTIME 180 capsule 1  . polycarbophil (FIBERCON) 625 MG tablet Take 2 tablets (1,250 mg total) by mouth daily. 60 tablet 0  . traMADol (ULTRAM) 50 MG tablet Take 1 tablet (50 mg total) by mouth every 8 (eight) hours as needed. 60 tablet 0  . vitamin B-12 (CYANOCOBALAMIN) 100 MCG tablet Take 100 mcg by mouth daily.    . potassium chloride SA (K-DUR,KLOR-CON) 20 MEQ tablet Take 1 tablet (20 mEq total) by mouth once. 90 tablet 1   No current facility-administered medications on file prior to visit.     Past Medical History:  Diagnosis Date  . Blood transfusion without reported diagnosis   . Chronic low back pain   . Hyperlipidemia   . Osteopenia   . Unspecified essential hypertension   . Unspecified hypothyroidism     Past Surgical History:  Procedure Laterality Date  . ABDOMINAL HYSTERECTOMY  1970  . CHOLECYSTECTOMY  07/2009   Dr. Rise Patience  . COLONOSCOPY  2003  . FLEXIBLE SIGMOIDOSCOPY  2010  . HAND SURGERY    . IR ANGIO INTRA EXTRACRAN SEL COM CAROTID INNOMINATE UNI L MOD SED  11/29/2016  . IR ANGIO VERTEBRAL SEL SUBCLAVIAN INNOMINATE UNI R MOD SED  11/29/2016  . IR PERCUTANEOUS ART THROMBECTOMY/INFUSION INTRACRANIAL INC DIAG ANGIO  11/29/2016  . IR RADIOLOGIST EVAL & MGMT  03/24/2017  . LUMBAR LAMINECTOMY  11/2008   Done by Dr. Patrice Paradise  . RADIOLOGY WITH ANESTHESIA N/A 11/29/2016   Procedure: RADIOLOGY WITH ANESTHESIA;  Surgeon: Radiologist, Medication, MD;  Location: [facility];  Service: Radiology;  Laterality: N/A;  . THYROIDECTOMY      Social History   Social History  . Marital status: Married    Spouse name: N/A  . Number of children: N/A  . Years of education: N/A   Social History Main Topics  . Smoking status: Never Smoker  . Smokeless  tobacco: Never Used  . Alcohol use No  . Drug use: No  . Sexual activity: Not Currently   Other Topics Concern  . None   Social History Narrative   Married    Family History  Problem Relation Age of Onset  . Heart disease Father 29       MI age 15s  . Lung cancer Brother 62  . Colon cancer Neg Hx   . Esophageal cancer Neg Hx   . Rectal cancer Neg Hx   . Stomach cancer Neg Hx     Review of Systems  Constitutional: Positive for fatigue (constant). Negative  for chills and fever.  Respiratory: Negative for cough, shortness of breath and wheezing.   Cardiovascular: Positive for leg swelling. Negative for chest pain and palpitations.  Neurological: Positive for light-headedness. Negative for headaches.       Objective:   Vitals:   05/12/17 1535  BP: 132/72  Pulse: 74  Resp: 16  Temp: 97.8 F (36.6 C)  SpO2: 97%   Filed Weights   05/12/17 1535  Weight: 156 lb (70.8 kg)   Body mass index is 27.63 kg/m.  Wt Readings from Last 3 Encounters:  05/12/17 156 lb (70.8 kg)  04/12/17 153 lb (69.4 kg)  01/29/17 152 lb (68.9 kg)     Physical Exam Constitutional: Appears well-developed and well-nourished. No distress.  HENT:  Head: Normocephalic and atraumatic.  Neck: Neck supple. No tracheal deviation present. No thyromegaly present.  No cervical lymphadenopathy Cardiovascular: Normal rate, regular rhythm with premature beatsand normal heart sounds.   2/6 systolic murmur heard. No carotid bruit .  Mild b/l LE edema Pulmonary/Chest: Effort normal and breath sounds normal. No respiratory distress. No has no wheezes. No rales.  Skin: Skin is warm and dry. Not diaphoretic.  Psychiatric: Normal mood and affect. Behavior is normal.         Assessment & Plan:    See Problem List for Assessment and Plan of chronic medical problems.

## 2017-05-12 NOTE — Assessment & Plan Note (Signed)
Sinus rhythm on exam with PAC's  - unlikely afib, but ? Intermittent afib  Given bradycardia and other symptoms - ? Cardiac cause - will refer to cardio

## 2017-05-12 NOTE — Assessment & Plan Note (Signed)
BP is good, HR good here today - has had bradycardia at home - ? Causing symptoms Will refer to cardio for full eval prior to medication changes

## 2017-05-12 NOTE — Patient Instructions (Addendum)
  Medications reviewed and updated.  No changes recommended at this time.   A referral was ordered for cardiology and pain management.

## 2017-05-17 ENCOUNTER — Encounter: Payer: Medicare Other | Admitting: Occupational Therapy

## 2017-05-17 ENCOUNTER — Ambulatory Visit: Payer: Medicare Other | Admitting: Physical Therapy

## 2017-05-17 DIAGNOSIS — Z7901 Long term (current) use of anticoagulants: Secondary | ICD-10-CM | POA: Diagnosis not present

## 2017-05-17 DIAGNOSIS — I130 Hypertensive heart and chronic kidney disease with heart failure and stage 1 through stage 4 chronic kidney disease, or unspecified chronic kidney disease: Secondary | ICD-10-CM | POA: Diagnosis not present

## 2017-05-17 DIAGNOSIS — I69391 Dysphagia following cerebral infarction: Secondary | ICD-10-CM | POA: Diagnosis not present

## 2017-05-17 DIAGNOSIS — I69390 Apraxia following cerebral infarction: Secondary | ICD-10-CM | POA: Diagnosis not present

## 2017-05-17 DIAGNOSIS — I69320 Aphasia following cerebral infarction: Secondary | ICD-10-CM | POA: Diagnosis not present

## 2017-05-17 DIAGNOSIS — D631 Anemia in chronic kidney disease: Secondary | ICD-10-CM | POA: Diagnosis not present

## 2017-05-17 DIAGNOSIS — R1310 Dysphagia, unspecified: Secondary | ICD-10-CM | POA: Diagnosis not present

## 2017-05-17 DIAGNOSIS — N183 Chronic kidney disease, stage 3 (moderate): Secondary | ICD-10-CM | POA: Diagnosis not present

## 2017-05-17 DIAGNOSIS — I69351 Hemiplegia and hemiparesis following cerebral infarction affecting right dominant side: Secondary | ICD-10-CM | POA: Diagnosis not present

## 2017-05-17 DIAGNOSIS — R7303 Prediabetes: Secondary | ICD-10-CM | POA: Diagnosis not present

## 2017-05-17 DIAGNOSIS — I5030 Unspecified diastolic (congestive) heart failure: Secondary | ICD-10-CM | POA: Diagnosis not present

## 2017-05-17 DIAGNOSIS — I4891 Unspecified atrial fibrillation: Secondary | ICD-10-CM | POA: Diagnosis not present

## 2017-05-17 DIAGNOSIS — Z993 Dependence on wheelchair: Secondary | ICD-10-CM | POA: Diagnosis not present

## 2017-05-18 DIAGNOSIS — I69351 Hemiplegia and hemiparesis following cerebral infarction affecting right dominant side: Secondary | ICD-10-CM | POA: Diagnosis not present

## 2017-05-18 DIAGNOSIS — D631 Anemia in chronic kidney disease: Secondary | ICD-10-CM | POA: Diagnosis not present

## 2017-05-18 DIAGNOSIS — I4891 Unspecified atrial fibrillation: Secondary | ICD-10-CM | POA: Diagnosis not present

## 2017-05-18 DIAGNOSIS — I69390 Apraxia following cerebral infarction: Secondary | ICD-10-CM | POA: Diagnosis not present

## 2017-05-18 DIAGNOSIS — R7303 Prediabetes: Secondary | ICD-10-CM | POA: Diagnosis not present

## 2017-05-18 DIAGNOSIS — I5030 Unspecified diastolic (congestive) heart failure: Secondary | ICD-10-CM | POA: Diagnosis not present

## 2017-05-18 DIAGNOSIS — R1310 Dysphagia, unspecified: Secondary | ICD-10-CM | POA: Diagnosis not present

## 2017-05-18 DIAGNOSIS — Z7901 Long term (current) use of anticoagulants: Secondary | ICD-10-CM | POA: Diagnosis not present

## 2017-05-18 DIAGNOSIS — I130 Hypertensive heart and chronic kidney disease with heart failure and stage 1 through stage 4 chronic kidney disease, or unspecified chronic kidney disease: Secondary | ICD-10-CM | POA: Diagnosis not present

## 2017-05-18 DIAGNOSIS — Z993 Dependence on wheelchair: Secondary | ICD-10-CM | POA: Diagnosis not present

## 2017-05-18 DIAGNOSIS — I69391 Dysphagia following cerebral infarction: Secondary | ICD-10-CM | POA: Diagnosis not present

## 2017-05-18 DIAGNOSIS — I69320 Aphasia following cerebral infarction: Secondary | ICD-10-CM | POA: Diagnosis not present

## 2017-05-18 DIAGNOSIS — N183 Chronic kidney disease, stage 3 (moderate): Secondary | ICD-10-CM | POA: Diagnosis not present

## 2017-05-19 DIAGNOSIS — I69320 Aphasia following cerebral infarction: Secondary | ICD-10-CM | POA: Diagnosis not present

## 2017-05-19 DIAGNOSIS — N183 Chronic kidney disease, stage 3 (moderate): Secondary | ICD-10-CM | POA: Diagnosis not present

## 2017-05-19 DIAGNOSIS — Z993 Dependence on wheelchair: Secondary | ICD-10-CM | POA: Diagnosis not present

## 2017-05-19 DIAGNOSIS — I130 Hypertensive heart and chronic kidney disease with heart failure and stage 1 through stage 4 chronic kidney disease, or unspecified chronic kidney disease: Secondary | ICD-10-CM | POA: Diagnosis not present

## 2017-05-19 DIAGNOSIS — R7303 Prediabetes: Secondary | ICD-10-CM | POA: Diagnosis not present

## 2017-05-19 DIAGNOSIS — D631 Anemia in chronic kidney disease: Secondary | ICD-10-CM | POA: Diagnosis not present

## 2017-05-19 DIAGNOSIS — I69391 Dysphagia following cerebral infarction: Secondary | ICD-10-CM | POA: Diagnosis not present

## 2017-05-19 DIAGNOSIS — I5030 Unspecified diastolic (congestive) heart failure: Secondary | ICD-10-CM | POA: Diagnosis not present

## 2017-05-19 DIAGNOSIS — I4891 Unspecified atrial fibrillation: Secondary | ICD-10-CM | POA: Diagnosis not present

## 2017-05-19 DIAGNOSIS — I69351 Hemiplegia and hemiparesis following cerebral infarction affecting right dominant side: Secondary | ICD-10-CM | POA: Diagnosis not present

## 2017-05-19 DIAGNOSIS — R1310 Dysphagia, unspecified: Secondary | ICD-10-CM | POA: Diagnosis not present

## 2017-05-19 DIAGNOSIS — I69390 Apraxia following cerebral infarction: Secondary | ICD-10-CM | POA: Diagnosis not present

## 2017-05-19 DIAGNOSIS — Z7901 Long term (current) use of anticoagulants: Secondary | ICD-10-CM | POA: Diagnosis not present

## 2017-05-20 ENCOUNTER — Ambulatory Visit: Payer: Medicare Other | Admitting: Physical Therapy

## 2017-05-20 ENCOUNTER — Encounter: Payer: Medicare Other | Admitting: Occupational Therapy

## 2017-05-20 DIAGNOSIS — I4891 Unspecified atrial fibrillation: Secondary | ICD-10-CM | POA: Diagnosis not present

## 2017-05-20 DIAGNOSIS — N183 Chronic kidney disease, stage 3 (moderate): Secondary | ICD-10-CM | POA: Diagnosis not present

## 2017-05-20 DIAGNOSIS — R7303 Prediabetes: Secondary | ICD-10-CM | POA: Diagnosis not present

## 2017-05-20 DIAGNOSIS — D631 Anemia in chronic kidney disease: Secondary | ICD-10-CM | POA: Diagnosis not present

## 2017-05-20 DIAGNOSIS — I130 Hypertensive heart and chronic kidney disease with heart failure and stage 1 through stage 4 chronic kidney disease, or unspecified chronic kidney disease: Secondary | ICD-10-CM | POA: Diagnosis not present

## 2017-05-20 DIAGNOSIS — I69320 Aphasia following cerebral infarction: Secondary | ICD-10-CM | POA: Diagnosis not present

## 2017-05-20 DIAGNOSIS — R1310 Dysphagia, unspecified: Secondary | ICD-10-CM | POA: Diagnosis not present

## 2017-05-20 DIAGNOSIS — I69390 Apraxia following cerebral infarction: Secondary | ICD-10-CM | POA: Diagnosis not present

## 2017-05-20 DIAGNOSIS — Z7901 Long term (current) use of anticoagulants: Secondary | ICD-10-CM | POA: Diagnosis not present

## 2017-05-20 DIAGNOSIS — I5030 Unspecified diastolic (congestive) heart failure: Secondary | ICD-10-CM | POA: Diagnosis not present

## 2017-05-20 DIAGNOSIS — I69351 Hemiplegia and hemiparesis following cerebral infarction affecting right dominant side: Secondary | ICD-10-CM | POA: Diagnosis not present

## 2017-05-20 DIAGNOSIS — I69391 Dysphagia following cerebral infarction: Secondary | ICD-10-CM | POA: Diagnosis not present

## 2017-05-20 DIAGNOSIS — Z993 Dependence on wheelchair: Secondary | ICD-10-CM | POA: Diagnosis not present

## 2017-05-24 ENCOUNTER — Encounter: Payer: Medicare Other | Admitting: Occupational Therapy

## 2017-05-24 ENCOUNTER — Ambulatory Visit: Payer: Medicare Other | Admitting: Physical Therapy

## 2017-05-24 ENCOUNTER — Encounter: Payer: Medicare Other | Admitting: Speech Pathology

## 2017-05-24 DIAGNOSIS — I130 Hypertensive heart and chronic kidney disease with heart failure and stage 1 through stage 4 chronic kidney disease, or unspecified chronic kidney disease: Secondary | ICD-10-CM | POA: Diagnosis not present

## 2017-05-24 DIAGNOSIS — I5030 Unspecified diastolic (congestive) heart failure: Secondary | ICD-10-CM | POA: Diagnosis not present

## 2017-05-24 DIAGNOSIS — R7303 Prediabetes: Secondary | ICD-10-CM | POA: Diagnosis not present

## 2017-05-24 DIAGNOSIS — Z993 Dependence on wheelchair: Secondary | ICD-10-CM | POA: Diagnosis not present

## 2017-05-24 DIAGNOSIS — N183 Chronic kidney disease, stage 3 (moderate): Secondary | ICD-10-CM | POA: Diagnosis not present

## 2017-05-24 DIAGNOSIS — I4891 Unspecified atrial fibrillation: Secondary | ICD-10-CM | POA: Diagnosis not present

## 2017-05-24 DIAGNOSIS — I69320 Aphasia following cerebral infarction: Secondary | ICD-10-CM | POA: Diagnosis not present

## 2017-05-24 DIAGNOSIS — I69390 Apraxia following cerebral infarction: Secondary | ICD-10-CM | POA: Diagnosis not present

## 2017-05-24 DIAGNOSIS — Z7901 Long term (current) use of anticoagulants: Secondary | ICD-10-CM | POA: Diagnosis not present

## 2017-05-24 DIAGNOSIS — I69351 Hemiplegia and hemiparesis following cerebral infarction affecting right dominant side: Secondary | ICD-10-CM | POA: Diagnosis not present

## 2017-05-24 DIAGNOSIS — R1310 Dysphagia, unspecified: Secondary | ICD-10-CM | POA: Diagnosis not present

## 2017-05-24 DIAGNOSIS — I69391 Dysphagia following cerebral infarction: Secondary | ICD-10-CM | POA: Diagnosis not present

## 2017-05-24 DIAGNOSIS — D631 Anemia in chronic kidney disease: Secondary | ICD-10-CM | POA: Diagnosis not present

## 2017-05-25 DIAGNOSIS — I130 Hypertensive heart and chronic kidney disease with heart failure and stage 1 through stage 4 chronic kidney disease, or unspecified chronic kidney disease: Secondary | ICD-10-CM | POA: Diagnosis not present

## 2017-05-25 DIAGNOSIS — I69391 Dysphagia following cerebral infarction: Secondary | ICD-10-CM | POA: Diagnosis not present

## 2017-05-25 DIAGNOSIS — I5030 Unspecified diastolic (congestive) heart failure: Secondary | ICD-10-CM | POA: Diagnosis not present

## 2017-05-25 DIAGNOSIS — R1310 Dysphagia, unspecified: Secondary | ICD-10-CM | POA: Diagnosis not present

## 2017-05-25 DIAGNOSIS — N183 Chronic kidney disease, stage 3 (moderate): Secondary | ICD-10-CM | POA: Diagnosis not present

## 2017-05-25 DIAGNOSIS — I4891 Unspecified atrial fibrillation: Secondary | ICD-10-CM | POA: Diagnosis not present

## 2017-05-25 DIAGNOSIS — R7303 Prediabetes: Secondary | ICD-10-CM | POA: Diagnosis not present

## 2017-05-25 DIAGNOSIS — I69320 Aphasia following cerebral infarction: Secondary | ICD-10-CM | POA: Diagnosis not present

## 2017-05-25 DIAGNOSIS — Z993 Dependence on wheelchair: Secondary | ICD-10-CM | POA: Diagnosis not present

## 2017-05-25 DIAGNOSIS — I69390 Apraxia following cerebral infarction: Secondary | ICD-10-CM | POA: Diagnosis not present

## 2017-05-25 DIAGNOSIS — Z7901 Long term (current) use of anticoagulants: Secondary | ICD-10-CM | POA: Diagnosis not present

## 2017-05-25 DIAGNOSIS — D631 Anemia in chronic kidney disease: Secondary | ICD-10-CM | POA: Diagnosis not present

## 2017-05-25 DIAGNOSIS — I69351 Hemiplegia and hemiparesis following cerebral infarction affecting right dominant side: Secondary | ICD-10-CM | POA: Diagnosis not present

## 2017-05-26 ENCOUNTER — Encounter: Payer: Self-pay | Admitting: Occupational Therapy

## 2017-05-26 NOTE — Therapy (Signed)
[facility] [address] [place], [place], [address] Phone: [phone]   Fax:  [phone]  Patient Details  Name: Valerie Mcclure MRN: [id number] Date of Birth: 06-15-1941 Referring Provider:  No ref. provider found  Encounter Date: 05/26/2017  OCCUPATIONAL THERAPY DISCHARGE SUMMARY  Visits from Start of Care: 1 (eval)  Current functional level related to goals / functional outcomes: See eval as pt did not return after eval   Remaining deficits: See eval as pt did not return after eval   Education / Equipment: Not completed as pt did not return  Plan: Patient agrees to discharge.  Patient goals were not met. Patient is being discharged due to not returning since the last visit.  Pt did not return after eval.  Cancellation notes concerns about cost and inquiry into home health. ?????          [facility] 05/26/2017, 8:24 AM  [facility] [address] [place], [place], [address] Phone: [phone]   Fax:  [place], OTR/L [facility] [address] [place], [place]  [address] [phone] phone [phone] 05/26/17 8:24 AM

## 2017-05-27 ENCOUNTER — Encounter: Payer: Medicare Other | Admitting: Occupational Therapy

## 2017-05-27 ENCOUNTER — Encounter: Payer: Medicare Other | Admitting: Speech Pathology

## 2017-05-27 ENCOUNTER — Ambulatory Visit: Payer: Medicare Other | Admitting: Physical Therapy

## 2017-05-27 DIAGNOSIS — Z9181 History of falling: Secondary | ICD-10-CM

## 2017-05-27 DIAGNOSIS — I130 Hypertensive heart and chronic kidney disease with heart failure and stage 1 through stage 4 chronic kidney disease, or unspecified chronic kidney disease: Secondary | ICD-10-CM | POA: Diagnosis not present

## 2017-05-27 DIAGNOSIS — D631 Anemia in chronic kidney disease: Secondary | ICD-10-CM | POA: Diagnosis not present

## 2017-05-27 DIAGNOSIS — Z79891 Long term (current) use of opiate analgesic: Secondary | ICD-10-CM

## 2017-05-27 DIAGNOSIS — Z993 Dependence on wheelchair: Secondary | ICD-10-CM | POA: Diagnosis not present

## 2017-05-27 DIAGNOSIS — I69391 Dysphagia following cerebral infarction: Secondary | ICD-10-CM | POA: Diagnosis not present

## 2017-05-27 DIAGNOSIS — I5030 Unspecified diastolic (congestive) heart failure: Secondary | ICD-10-CM | POA: Diagnosis not present

## 2017-05-27 DIAGNOSIS — I4891 Unspecified atrial fibrillation: Secondary | ICD-10-CM | POA: Diagnosis not present

## 2017-05-27 DIAGNOSIS — Z87891 Personal history of nicotine dependence: Secondary | ICD-10-CM

## 2017-05-27 DIAGNOSIS — R1310 Dysphagia, unspecified: Secondary | ICD-10-CM | POA: Diagnosis not present

## 2017-05-27 DIAGNOSIS — R7303 Prediabetes: Secondary | ICD-10-CM | POA: Diagnosis not present

## 2017-05-27 DIAGNOSIS — I69390 Apraxia following cerebral infarction: Secondary | ICD-10-CM | POA: Diagnosis not present

## 2017-05-27 DIAGNOSIS — Z7901 Long term (current) use of anticoagulants: Secondary | ICD-10-CM

## 2017-05-27 DIAGNOSIS — I69320 Aphasia following cerebral infarction: Secondary | ICD-10-CM | POA: Diagnosis not present

## 2017-05-27 DIAGNOSIS — N183 Chronic kidney disease, stage 3 (moderate): Secondary | ICD-10-CM | POA: Diagnosis not present

## 2017-05-27 DIAGNOSIS — I69351 Hemiplegia and hemiparesis following cerebral infarction affecting right dominant side: Secondary | ICD-10-CM | POA: Diagnosis not present

## 2017-05-31 DIAGNOSIS — G894 Chronic pain syndrome: Secondary | ICD-10-CM | POA: Diagnosis not present

## 2017-05-31 DIAGNOSIS — N183 Chronic kidney disease, stage 3 (moderate): Secondary | ICD-10-CM | POA: Diagnosis not present

## 2017-05-31 DIAGNOSIS — I69320 Aphasia following cerebral infarction: Secondary | ICD-10-CM | POA: Diagnosis not present

## 2017-05-31 DIAGNOSIS — I130 Hypertensive heart and chronic kidney disease with heart failure and stage 1 through stage 4 chronic kidney disease, or unspecified chronic kidney disease: Secondary | ICD-10-CM | POA: Diagnosis not present

## 2017-05-31 DIAGNOSIS — Z993 Dependence on wheelchair: Secondary | ICD-10-CM | POA: Diagnosis not present

## 2017-05-31 DIAGNOSIS — Z79891 Long term (current) use of opiate analgesic: Secondary | ICD-10-CM | POA: Diagnosis not present

## 2017-05-31 DIAGNOSIS — R1310 Dysphagia, unspecified: Secondary | ICD-10-CM | POA: Diagnosis not present

## 2017-05-31 DIAGNOSIS — M47816 Spondylosis without myelopathy or radiculopathy, lumbar region: Secondary | ICD-10-CM | POA: Diagnosis not present

## 2017-05-31 DIAGNOSIS — I69351 Hemiplegia and hemiparesis following cerebral infarction affecting right dominant side: Secondary | ICD-10-CM | POA: Diagnosis not present

## 2017-05-31 DIAGNOSIS — I69391 Dysphagia following cerebral infarction: Secondary | ICD-10-CM | POA: Diagnosis not present

## 2017-05-31 DIAGNOSIS — I4891 Unspecified atrial fibrillation: Secondary | ICD-10-CM | POA: Diagnosis not present

## 2017-05-31 DIAGNOSIS — D631 Anemia in chronic kidney disease: Secondary | ICD-10-CM | POA: Diagnosis not present

## 2017-05-31 DIAGNOSIS — R7303 Prediabetes: Secondary | ICD-10-CM | POA: Diagnosis not present

## 2017-05-31 DIAGNOSIS — Z7901 Long term (current) use of anticoagulants: Secondary | ICD-10-CM | POA: Diagnosis not present

## 2017-05-31 DIAGNOSIS — M4155 Other secondary scoliosis, thoracolumbar region: Secondary | ICD-10-CM | POA: Diagnosis not present

## 2017-05-31 DIAGNOSIS — I69390 Apraxia following cerebral infarction: Secondary | ICD-10-CM | POA: Diagnosis not present

## 2017-05-31 DIAGNOSIS — I5030 Unspecified diastolic (congestive) heart failure: Secondary | ICD-10-CM | POA: Diagnosis not present

## 2017-05-31 DIAGNOSIS — M25551 Pain in right hip: Secondary | ICD-10-CM | POA: Diagnosis not present

## 2017-06-01 ENCOUNTER — Ambulatory Visit: Payer: Medicare Other | Admitting: Physical Therapy

## 2017-06-01 ENCOUNTER — Encounter: Payer: Medicare Other | Admitting: Occupational Therapy

## 2017-06-01 DIAGNOSIS — I130 Hypertensive heart and chronic kidney disease with heart failure and stage 1 through stage 4 chronic kidney disease, or unspecified chronic kidney disease: Secondary | ICD-10-CM | POA: Diagnosis not present

## 2017-06-01 DIAGNOSIS — Z993 Dependence on wheelchair: Secondary | ICD-10-CM | POA: Diagnosis not present

## 2017-06-01 DIAGNOSIS — R7303 Prediabetes: Secondary | ICD-10-CM | POA: Diagnosis not present

## 2017-06-01 DIAGNOSIS — I69320 Aphasia following cerebral infarction: Secondary | ICD-10-CM | POA: Diagnosis not present

## 2017-06-01 DIAGNOSIS — I69391 Dysphagia following cerebral infarction: Secondary | ICD-10-CM | POA: Diagnosis not present

## 2017-06-01 DIAGNOSIS — D631 Anemia in chronic kidney disease: Secondary | ICD-10-CM | POA: Diagnosis not present

## 2017-06-01 DIAGNOSIS — N183 Chronic kidney disease, stage 3 (moderate): Secondary | ICD-10-CM | POA: Diagnosis not present

## 2017-06-01 DIAGNOSIS — I69390 Apraxia following cerebral infarction: Secondary | ICD-10-CM | POA: Diagnosis not present

## 2017-06-01 DIAGNOSIS — Z7901 Long term (current) use of anticoagulants: Secondary | ICD-10-CM | POA: Diagnosis not present

## 2017-06-01 DIAGNOSIS — I69351 Hemiplegia and hemiparesis following cerebral infarction affecting right dominant side: Secondary | ICD-10-CM | POA: Diagnosis not present

## 2017-06-01 DIAGNOSIS — I4891 Unspecified atrial fibrillation: Secondary | ICD-10-CM | POA: Diagnosis not present

## 2017-06-01 DIAGNOSIS — I5030 Unspecified diastolic (congestive) heart failure: Secondary | ICD-10-CM | POA: Diagnosis not present

## 2017-06-01 DIAGNOSIS — R1310 Dysphagia, unspecified: Secondary | ICD-10-CM | POA: Diagnosis not present

## 2017-06-02 DIAGNOSIS — I5030 Unspecified diastolic (congestive) heart failure: Secondary | ICD-10-CM | POA: Diagnosis not present

## 2017-06-02 DIAGNOSIS — Z7901 Long term (current) use of anticoagulants: Secondary | ICD-10-CM | POA: Diagnosis not present

## 2017-06-02 DIAGNOSIS — R7303 Prediabetes: Secondary | ICD-10-CM | POA: Diagnosis not present

## 2017-06-02 DIAGNOSIS — N183 Chronic kidney disease, stage 3 (moderate): Secondary | ICD-10-CM | POA: Diagnosis not present

## 2017-06-02 DIAGNOSIS — I69391 Dysphagia following cerebral infarction: Secondary | ICD-10-CM | POA: Diagnosis not present

## 2017-06-02 DIAGNOSIS — D631 Anemia in chronic kidney disease: Secondary | ICD-10-CM | POA: Diagnosis not present

## 2017-06-02 DIAGNOSIS — I130 Hypertensive heart and chronic kidney disease with heart failure and stage 1 through stage 4 chronic kidney disease, or unspecified chronic kidney disease: Secondary | ICD-10-CM | POA: Diagnosis not present

## 2017-06-02 DIAGNOSIS — Z993 Dependence on wheelchair: Secondary | ICD-10-CM | POA: Diagnosis not present

## 2017-06-02 DIAGNOSIS — R1310 Dysphagia, unspecified: Secondary | ICD-10-CM | POA: Diagnosis not present

## 2017-06-02 DIAGNOSIS — I69351 Hemiplegia and hemiparesis following cerebral infarction affecting right dominant side: Secondary | ICD-10-CM | POA: Diagnosis not present

## 2017-06-02 DIAGNOSIS — I69320 Aphasia following cerebral infarction: Secondary | ICD-10-CM | POA: Diagnosis not present

## 2017-06-02 DIAGNOSIS — I69390 Apraxia following cerebral infarction: Secondary | ICD-10-CM | POA: Diagnosis not present

## 2017-06-02 DIAGNOSIS — I4891 Unspecified atrial fibrillation: Secondary | ICD-10-CM | POA: Diagnosis not present

## 2017-06-03 ENCOUNTER — Encounter: Payer: Medicare Other | Admitting: Occupational Therapy

## 2017-06-03 ENCOUNTER — Ambulatory Visit: Payer: Medicare Other | Admitting: Physical Therapy

## 2017-06-04 DIAGNOSIS — I69320 Aphasia following cerebral infarction: Secondary | ICD-10-CM | POA: Diagnosis not present

## 2017-06-04 DIAGNOSIS — I69390 Apraxia following cerebral infarction: Secondary | ICD-10-CM | POA: Diagnosis not present

## 2017-06-04 DIAGNOSIS — Z993 Dependence on wheelchair: Secondary | ICD-10-CM | POA: Diagnosis not present

## 2017-06-04 DIAGNOSIS — D631 Anemia in chronic kidney disease: Secondary | ICD-10-CM | POA: Diagnosis not present

## 2017-06-04 DIAGNOSIS — Z7901 Long term (current) use of anticoagulants: Secondary | ICD-10-CM | POA: Diagnosis not present

## 2017-06-04 DIAGNOSIS — I5030 Unspecified diastolic (congestive) heart failure: Secondary | ICD-10-CM | POA: Diagnosis not present

## 2017-06-04 DIAGNOSIS — R1310 Dysphagia, unspecified: Secondary | ICD-10-CM | POA: Diagnosis not present

## 2017-06-04 DIAGNOSIS — R7303 Prediabetes: Secondary | ICD-10-CM | POA: Diagnosis not present

## 2017-06-04 DIAGNOSIS — I69391 Dysphagia following cerebral infarction: Secondary | ICD-10-CM | POA: Diagnosis not present

## 2017-06-04 DIAGNOSIS — I130 Hypertensive heart and chronic kidney disease with heart failure and stage 1 through stage 4 chronic kidney disease, or unspecified chronic kidney disease: Secondary | ICD-10-CM | POA: Diagnosis not present

## 2017-06-04 DIAGNOSIS — N183 Chronic kidney disease, stage 3 (moderate): Secondary | ICD-10-CM | POA: Diagnosis not present

## 2017-06-04 DIAGNOSIS — I69351 Hemiplegia and hemiparesis following cerebral infarction affecting right dominant side: Secondary | ICD-10-CM | POA: Diagnosis not present

## 2017-06-04 DIAGNOSIS — I4891 Unspecified atrial fibrillation: Secondary | ICD-10-CM | POA: Diagnosis not present

## 2017-06-07 ENCOUNTER — Ambulatory Visit: Payer: Medicare Other | Admitting: Physical Therapy

## 2017-06-07 ENCOUNTER — Encounter: Payer: Medicare Other | Admitting: Speech Pathology

## 2017-06-07 ENCOUNTER — Encounter: Payer: Medicare Other | Admitting: Occupational Therapy

## 2017-06-08 ENCOUNTER — Encounter: Payer: Medicare Other | Admitting: Speech Pathology

## 2017-06-08 ENCOUNTER — Ambulatory Visit: Payer: Medicare Other | Admitting: Physical Therapy

## 2017-06-08 ENCOUNTER — Encounter: Payer: Medicare Other | Admitting: Occupational Therapy

## 2017-06-08 DIAGNOSIS — I6330 Cerebral infarction due to thrombosis of unspecified cerebral artery: Secondary | ICD-10-CM | POA: Diagnosis not present

## 2017-06-14 ENCOUNTER — Ambulatory Visit: Payer: Medicare Other | Admitting: Internal Medicine

## 2017-06-14 ENCOUNTER — Other Ambulatory Visit (HOSPITAL_COMMUNITY): Payer: Self-pay | Admitting: Anesthesiology

## 2017-06-14 ENCOUNTER — Ambulatory Visit (HOSPITAL_COMMUNITY)
Admission: RE | Admit: 2017-06-14 | Discharge: 2017-06-14 | Disposition: A | Discharge status: Home / Self Care | Payer: Medicare Other | Source: Ambulatory Visit | Attending: Anesthesiology | Admitting: Anesthesiology

## 2017-06-14 DIAGNOSIS — M25551 Pain in right hip: Secondary | ICD-10-CM | POA: Insufficient documentation

## 2017-06-14 DIAGNOSIS — S79911A Unspecified injury of right hip, initial encounter: Secondary | ICD-10-CM | POA: Diagnosis not present

## 2017-06-15 ENCOUNTER — Encounter: Payer: Self-pay | Admitting: Neurology

## 2017-06-15 ENCOUNTER — Ambulatory Visit: Payer: Medicare Other | Admitting: Neurology

## 2017-06-15 VITALS — BP 123/59 | HR 52 | Ht 65.0 in | Wt 157.0 lb

## 2017-06-15 DIAGNOSIS — I4892 Unspecified atrial flutter: Secondary | ICD-10-CM

## 2017-06-15 DIAGNOSIS — E785 Hyperlipidemia, unspecified: Secondary | ICD-10-CM | POA: Diagnosis not present

## 2017-06-15 DIAGNOSIS — I63512 Cerebral infarction due to unspecified occlusion or stenosis of left middle cerebral artery: Secondary | ICD-10-CM

## 2017-06-15 DIAGNOSIS — I10 Essential (primary) hypertension: Secondary | ICD-10-CM | POA: Diagnosis not present

## 2017-06-15 DIAGNOSIS — I6389 Other cerebral infarction: Secondary | ICD-10-CM | POA: Diagnosis not present

## 2017-06-15 NOTE — Patient Instructions (Addendum)
-   continue eliquis and lipitor for stroke prevention - check BP at home and record - Follow up with your primary care physician for stroke risk factor modification. Recommend maintain blood pressure goal <130/80, diabetes with hemoglobin A1c goal below 7.0% and lipids with LDL cholesterol goal below 70 mg/dL.  - home exercise for right hand and speech  - may consider cardiology if needed in the future - may consider EEG if any further concern for seizure - continue to follow up with pain clinic for LBP - follow up in 6 months

## 2017-06-15 NOTE — Progress Notes (Signed)
STROKE NEUROLOGY FOLLOW UP NOTE  NAME: Valerie Mcclure DOB: December 23, 1940  REASON FOR VISIT: stroke follow up HISTORY FROM: chart  Today we had the pleasure of seeing [name] in follow-up at our Neurology Clinic. Pt was accompanied by husband.   History Summary Ms.Ariany A [name] a 76 y.o.femalewith history of chronic LBP, HTN, HLD, hypothyroidism admitted on 11/29/16 for aphasia and right hemiplegia on awakening.  CT head showed left temporal lobe hypodensity and hyperdense left MCA.  CT head neck showed left M1 occlusion.  CTP showed significantly penumbra.  Underwent IR and received to TICI3 revascularization of the occluded left MCA with mechanical thrombectomy.  However, post IR CT showed left BG ICH and left temporal hemorrhagic conversion, as well as bilateral tentorium SDH with midline shift.  MRI showed left MCA infarct with left BG hemorrhage.  EF 55-60%, negative for DVT.  LDL 85 and A1c 5.8.  Started Zocor and aspirin at day 7.  Initially considered TEE and loop recorder, however cardiology recommended 30-day monitoring at discharge.  At [facility], patient was found to have atrial flutter on 12/23/16, and Eliquis was started at the time.  Patient was discharged from [facility] on 01/09/17.  Readmission - on 01/11/17 for worsening right UE and LE weakness and aphasia.  MRI showed acute/early subacute infarction of left carotid body extending into periventricular white matter and within the left medial thalamus, likely extension of recent stroke.  MRA head negative.  Continue on Eliquis and statin, and discharged to [facility] again.  Interval History During the interval time, the patient has been doing well.  Patient finished home health PT/OT in July, and much improved, near baseline.  However, still has intermittent speech difficulties.  Mild right-sided paraparesis.  BP today 123/59 continue on Eliquis and Lipitor without side effect.  REVIEW OF SYSTEMS: Full 14 system review of  systems performed and notable only for those listed below and in HPI above, all others are negative:  Constitutional:   Cardiovascular: Leg swelling Ear/Nose/Throat:   Skin:  Eyes:   Respiratory:   Gastroitestinal:   Genitourinary:  Hematology/Lymphatic:   Endocrine:  Musculoskeletal: Back pain Allergy/Immunology:   Neurological: Speech difficulty Psychiatric:  Sleep:   The following represents the patient's updated allergies and side effects list: Allergies  Allergen Reactions  . Shrimp [Shellfish Allergy] Anaphylaxis and Rash    Break outs and swelling of the throat  . Valsartan Other (See Comments)    Renal failure  . Tandem Plus [Fefum-Fepo-Fa-B Cmp-C-Zn-Mn-Cu] Nausea And Vomiting  . Ivp Dye [Iodinated Diagnostic Agents] Rash    itching  . Penicillins Itching and Rash    Has patient had a PCN reaction causing immediate rash, facial/tongue/throat swelling, SOB or lightheadedness with hypotension: Yes Has patient had a PCN reaction causing severe rash involving mucus membranes or skin necrosis: No Has patient had a PCN reaction that required hospitalization: No Has patient had a PCN reaction occurring within the last 10 years: No If all of the above answers are "NO", then may proceed with Cephalosporin use.     The neurologically relevant items on the patient's problem list were reviewed on today's visit.  Neurologic Examination  A problem focused neurological exam (12 or more points of the single system neurologic examination, vital signs counts as 1 point, cranial nerves count for 8 points) was performed.  Blood pressure (!) 123/59, pulse (!) 52, height 5\' 5"  (1.651 m), weight 157 lb (71.2 kg).  General - Well nourished, well developed,  in no apparent distress.  Ophthalmologic - Sharp disc margins OU.   Cardiovascular - Regular rate and rhythm.  Mental Status -  Level of arousal and orientation to time, place were intact, however, not orientated to  person. Intermittent speech hesitancy, mild expressive aphasia, following all commands, 2/4 naming, and difficulty repetition with complex sentences.  Cranial Nerves II - XII - II - Visual field intact OU. III, IV, VI - Extraocular movements intact. V - Facial sensation intact bilaterally. VII - Facial movement intact bilaterally. VIII - Hearing & vestibular intact bilaterally. X - Palate elevates symmetrically. XI - Chin turning & shoulder shrug intact bilaterally. XII - Tongue protrusion intact.  Motor Strength - The patient's strength was normal in all extremities except right hand mild dexterity difficulty and right arm pronator drift was present.  Bulk was normal and fasciculations were absent.   Motor Tone - Muscle tone was assessed at the neck and appendages and was normal.  Reflexes - The patient's reflexes were 1+ in all extremities and she had no pathological reflexes.  Sensory - Light touch, temperature/pinprick were assessed and were normal.    Coordination - The patient had normal movements in the hands with no ataxia or dysmetria.  Tremor was absent.  Gait and Station - walk with walker, right hemi-pleuritic gait   Functional score  mRS = 2   0 - No symptoms.   1 - No significant disability. Able to carry out all usual activities, despite some symptoms.   2 - Slight disability. Able to look after own affairs without assistance, but unable to carry out all previous activities.   3 - Moderate disability. Requires some help, but able to walk unassisted.   4 - Moderately severe disability. Unable to attend to own bodily needs without assistance, and unable to walk unassisted.   5 - Severe disability. Requires constant nursing care and attention, bedridden, incontinent.   6 - Dead.   NIH Stroke Scale = 2  Level Of Consciousness 0=Alert; keenly responsive 1=Not alert, but arousable by minor stimulation 2=Not alert, requires repeated stimulation 3=Responds  only with reflex movements 0  LOC Questions to Month and Age 26=Answers both questions correctly 1=Answers one question correctly 2=Answers neither question correctly 0  LOC Commands      -Open/Close eyes     -Open/close grip 0=Performs both tasks correctly 1=Performs one task correctly 2=Performs neighter task correctly 0  Best Gaze 0=Normal 1=Partial gaze palsy 2=Forced deviation, or total gaze paresis 0  Visual 0=No visual loss 1=Partial hemianopia 2=Complete hemianopia 3=Bilateral hemianopia (blind including cortical blindness) 0  Facial Palsy 0=Normal symmetrical movement 1=Minor paralysis (asymmetry) 2=Partial paralysis (lower face) 3=Complete paralysis (upper and lower face) 0  Motor  0=No drift, limb holds posture for full 10 seconds 1=Drift, limb holds posture, no drift to bed 2=Some antigravity effort, cannot maintain posture, drifts to bed 3=No effort against gravity, limb falls 4=No movement Right Arm 0     Leg 0    Left Arm 1     Leg 0  Limb Ataxia 0=Absent 1=Present in one limb 2=Present in two limbs 0  Sensory 0=Normal 1=Mild to moderate sensory loss 2=Severe to total sensory loss 0  Best Language 0=No aphasia, normal 1=Mild to moderate aphasia 2=Mute, global aphasia 3=Mute, global aphasia 1  Dysarthria 0=Normal 1=Mild to moderate 2=Severe, unintelligible or mute/anarthric 0  Extinction/Neglect 0=No abnormality 1=Extinction to bilateral simultaneous stimulation 2=Profound neglect 0  Total   2     Data  reviewed: I personally reviewed the images and agree with the radiology interpretations.  Ct Head Wo Contrast 11/29/2016 0906 Hypodensity left temporal lobe consistent with acute infarct. Hyperdense left MCA compatible with thrombus or embolus. Aspects score 8  Ct Angio Head W Or Wo Contrast Ct Angio Neck W Or Wo Contrast Ct Cerebral Perfusion W Contrast 11/29/2016 Left M1 occlusion due to acute thrombus or embolus. Hypodensity left lower  temporal lobe compatible with acute infarct. There is significant penumbra in the left temporoparietal lobe. Irregular noncalcified plaque left internal carotid artery could be a source of emboli or not the high non stenotic atherosclerotic disease. No significant stenosis in the carotid or vertebral arteries in the neck. Atherosclerotic calcification in the cavernous carotid bilaterally.   Cerebral angio 11/29/2016 Status post endovascular complete revascularization of occluded left middle cerebral artery with 1 pass with the Solitaire FR 4 mm x 40 mm retrieval device, with achievement of a TICI 3 reperfusion. Groin puncture to initial reperfusion TICI2b 32 minutes. Groin puncture to TICI reperfusion 38 minutes   Ct Head Wo Contrast 11/29/2016 Large area of hemorrhage and extravasated contrast in the left basal ganglia, blood volume 39 mL. Hemorrhagic transformation of left temporal infarct. Mild amount of subdural hematoma along the tentorium bilaterally. 6 mm midline shift to the right.   MRI head 11/30/2016 1. Left MCA infarct with the most confluent diffusion restriction in the left temporal lobe. Superimposed 5.5 cm intra-axial hemorrhage centered at the left basal ganglia with intra-axial blood volume estimated 26 mL. Associated left hemisphere edema.  2. Rightward midline shift of 7 mm. Basilar cisterns remain patent. 3. Effaced left lateral ventricle and trace intraventricular hemorrhage without ventriculomegaly. 4. Occasional small foci of restricted diffusion also at the left PCA territory.  LE venous doppler - no DVT  2D Echocardiogram  - Normal LV size with EF 55-60%. Moderate diastolic dysfunction.Normal RV size and systolic function. Mild pulmonaryhypertension. Mild mitral regurgitation.  Ct Head Wo Contrast 01/11/2017 IMPRESSION: Evolutionary changes and the area of hemorrhage in the left basal ganglia and prior infarcts seen in the anterior left temporal lobe. No acute  infarction or hemorrhage.  Mr Brain 69 Contrast Mr Jodene Nam Head/brain Wo Cm 01/12/2017 IMPRESSION: 1. Reduced diffusion within left caudate body extending in the periventricular white matter and within the left medial thalamus increased in distribution in comparison with prior MRI of the brain. Findings are consistent with interval acute/early subacute infarction. 2. Decreased size of late subacute hematoma within the left basal ganglia. Left lateral ventricle hemosiderin staining. 3. Left anterior temporal lobe encephalomalacia from prior infarction. 4. Patent circle of Willis. No large vessel occlusion, aneurysm, or significant stenosis is identified.   Component     Latest Ref Rng & Units 11/30/2016  Cholesterol     0 - 200 mg/dL 162  Triglycerides     <150 mg/dL 175 (H)  HDL Cholesterol     >40 mg/dL 42  Total CHOL/HDL Ratio     RATIO 3.9  VLDL     0 - 40 mg/dL 35  LDL (calc)     0 - 99 mg/dL 85  Hemoglobin A1C     4.8 - 5.6 % 5.8 (H)  Mean Plasma Glucose     mg/dL 120  T4,Free(Direct)     0.61 - 1.12 ng/dL 1.36 (H)  TSH     0.350 - 4.500 uIU/mL 1.076    Assessment: As you may recall, she is a 76 y.o. Caucasian female with PMH of  chronic LBP, HTN, HLD, hypothyroidism admitted on 11/29/16 for aphasia and right hemiplegia on awakening.  CT head showed left temporal lobe hypodensity and hyperdense left MCA.  CT head neck showed left M1 occlusion.  CTP showed significantly penumbra.  Underwent IR and received to TICI3 revascularization of the occluded left MCA with mechanical thrombectomy.  However, post IR CT showed left BG ICH and left temporal hemorrhagic conversion, as well as bilateral tentorium SDH with midline shift.  MRI showed left MCA infarct with left BG hemorrhage.  EF 55-60%, negative for DVT.  LDL 85 and A1c 5.8.  Started Zocor and aspirin at day 7. At [facility], patient was found to have atrial flutter on 12/23/16, and Eliquis was started at the time.  Readmitted on 01/11/17 for  worsening right UE and LE weakness and aphasia.  MRI showed acute/early subacute infarction of left carotid body extending into periventricular white matter and within the left medial thalamus, likely extension of recent stroke. However, the pattern also resembles seizure like activity. MRA head negative.  Continue on Eliquis and statin. Now pt finished home health PT/OT in July, and much improved, near baseline.  However, still has intermittent speech difficulties.  Mild right-sided paraparesis.  Still on Eliquis and Lipitor without side effect.  Plan:  - continue eliquis and lipitor for stroke prevention - check BP at home and record - Follow up with your primary care physician for stroke risk factor modification. Recommend maintain blood pressure goal <130/80, diabetes with hemoglobin A1c goal below 7.0% and lipids with LDL cholesterol goal below 70 mg/dL.  - home exercise for right hand and speech  - may consider cardiology if needed in the future - may consider EEG if any further concern for seizure - continue to follow up with pain clinic for LBP - follow up in 6 months   I spent more than 25 minutes of face to face time with the patient. Greater than 50% of time was spent in counseling and coordination of care.   No orders of the defined types were placed in this encounter.   No orders of the defined types were placed in this encounter.   Patient Instructions  - continue eliquis and lipitor for stroke prevention - check BP at home and record - Follow up with your primary care physician for stroke risk factor modification. Recommend maintain blood pressure goal <130/80, diabetes with hemoglobin A1c goal below 7.0% and lipids with LDL cholesterol goal below 70 mg/dL.  - home exercise for right hand and speech  - may consider cardiology if needed in the future - may consider EEG if any further concern for seizure - continue to follow up with pain clinic for LBP - follow up in 6 months     Rosalin Hawking, MD PhD [facility] [address] [place], [place] [address] [phone]

## 2017-06-16 DIAGNOSIS — G894 Chronic pain syndrome: Secondary | ICD-10-CM | POA: Diagnosis not present

## 2017-06-16 DIAGNOSIS — M25551 Pain in right hip: Secondary | ICD-10-CM | POA: Diagnosis not present

## 2017-06-16 DIAGNOSIS — M4155 Other secondary scoliosis, thoracolumbar region: Secondary | ICD-10-CM | POA: Diagnosis not present

## 2017-06-16 DIAGNOSIS — M47816 Spondylosis without myelopathy or radiculopathy, lumbar region: Secondary | ICD-10-CM | POA: Diagnosis not present

## 2017-07-02 ENCOUNTER — Other Ambulatory Visit: Payer: Self-pay | Admitting: Internal Medicine

## 2017-07-08 DIAGNOSIS — I6330 Cerebral infarction due to thrombosis of unspecified cerebral artery: Secondary | ICD-10-CM | POA: Diagnosis not present

## 2017-07-12 ENCOUNTER — Telehealth: Payer: Self-pay | Admitting: Internal Medicine

## 2017-07-12 ENCOUNTER — Observation Stay (HOSPITAL_COMMUNITY): Payer: Medicare Other

## 2017-07-12 ENCOUNTER — Emergency Department (HOSPITAL_COMMUNITY): Payer: Medicare Other

## 2017-07-12 ENCOUNTER — Observation Stay (HOSPITAL_COMMUNITY)
Admission: EM | Admit: 2017-07-12 | Discharge: 2017-07-13 | Disposition: A | Discharge status: Home / Self Care | Payer: Medicare Other | Attending: Family Medicine | Admitting: Family Medicine

## 2017-07-12 ENCOUNTER — Encounter (HOSPITAL_COMMUNITY): Payer: Self-pay | Admitting: Internal Medicine

## 2017-07-12 DIAGNOSIS — Z79899 Other long term (current) drug therapy: Secondary | ICD-10-CM | POA: Insufficient documentation

## 2017-07-12 DIAGNOSIS — R4781 Slurred speech: Secondary | ICD-10-CM

## 2017-07-12 DIAGNOSIS — Z7901 Long term (current) use of anticoagulants: Secondary | ICD-10-CM | POA: Diagnosis not present

## 2017-07-12 DIAGNOSIS — I10 Essential (primary) hypertension: Secondary | ICD-10-CM | POA: Diagnosis not present

## 2017-07-12 DIAGNOSIS — N189 Chronic kidney disease, unspecified: Secondary | ICD-10-CM | POA: Diagnosis not present

## 2017-07-12 DIAGNOSIS — R402441 Other coma, without documented Glasgow coma scale score, or with partial score reported, in the field [EMT or ambulance]: Secondary | ICD-10-CM | POA: Diagnosis not present

## 2017-07-12 DIAGNOSIS — I130 Hypertensive heart and chronic kidney disease with heart failure and stage 1 through stage 4 chronic kidney disease, or unspecified chronic kidney disease: Secondary | ICD-10-CM | POA: Diagnosis not present

## 2017-07-12 DIAGNOSIS — N1832 Chronic kidney disease, stage 3b: Secondary | ICD-10-CM | POA: Diagnosis present

## 2017-07-12 DIAGNOSIS — E039 Hypothyroidism, unspecified: Secondary | ICD-10-CM | POA: Diagnosis not present

## 2017-07-12 DIAGNOSIS — G459 Transient cerebral ischemic attack, unspecified: Secondary | ICD-10-CM | POA: Diagnosis not present

## 2017-07-12 DIAGNOSIS — R4701 Aphasia: Secondary | ICD-10-CM | POA: Diagnosis not present

## 2017-07-12 DIAGNOSIS — R29810 Facial weakness: Secondary | ICD-10-CM

## 2017-07-12 DIAGNOSIS — I480 Paroxysmal atrial fibrillation: Secondary | ICD-10-CM | POA: Diagnosis not present

## 2017-07-12 DIAGNOSIS — D638 Anemia in other chronic diseases classified elsewhere: Secondary | ICD-10-CM | POA: Diagnosis present

## 2017-07-12 DIAGNOSIS — I5032 Chronic diastolic (congestive) heart failure: Secondary | ICD-10-CM | POA: Diagnosis present

## 2017-07-12 DIAGNOSIS — N184 Chronic kidney disease, stage 4 (severe): Secondary | ICD-10-CM | POA: Diagnosis present

## 2017-07-12 DIAGNOSIS — I4891 Unspecified atrial fibrillation: Secondary | ICD-10-CM | POA: Diagnosis not present

## 2017-07-12 LAB — I-STAT CHEM 8, ED
BUN: 16 mg/dL (ref 6–20)
CHLORIDE: 101 mmol/L (ref 101–111)
CREATININE: 1.4 mg/dL — AB (ref 0.44–1.00)
Calcium, Ion: 1.14 mmol/L — ABNORMAL LOW (ref 1.15–1.40)
GLUCOSE: 127 mg/dL — AB (ref 65–99)
HEMATOCRIT: 37 % (ref 36.0–46.0)
HEMOGLOBIN: 12.6 g/dL (ref 12.0–15.0)
POTASSIUM: 4.1 mmol/L (ref 3.5–5.1)
Sodium: 139 mmol/L (ref 135–145)
TCO2: 26 mmol/L (ref 22–32)

## 2017-07-12 LAB — CBC
HEMATOCRIT: 37.2 % (ref 36.0–46.0)
Hemoglobin: 12.2 g/dL (ref 12.0–15.0)
MCH: 29.3 pg (ref 26.0–34.0)
MCHC: 32.8 g/dL (ref 30.0–36.0)
MCV: 89.2 fL (ref 78.0–100.0)
Platelets: 336 10*3/uL (ref 150–400)
RBC: 4.17 MIL/uL (ref 3.87–5.11)
RDW: 13.4 % (ref 11.5–15.5)
WBC: 7.6 10*3/uL (ref 4.0–10.5)

## 2017-07-12 LAB — COMPREHENSIVE METABOLIC PANEL
ALT: 11 U/L — AB (ref 14–54)
AST: 21 U/L (ref 15–41)
Albumin: 3.6 g/dL (ref 3.5–5.0)
Alkaline Phosphatase: 89 U/L (ref 38–126)
Anion gap: 8 (ref 5–15)
BILIRUBIN TOTAL: 0.7 mg/dL (ref 0.3–1.2)
BUN: 14 mg/dL (ref 6–20)
CALCIUM: 9 mg/dL (ref 8.9–10.3)
CO2: 26 mmol/L (ref 22–32)
CREATININE: 1.44 mg/dL — AB (ref 0.44–1.00)
Chloride: 103 mmol/L (ref 101–111)
GFR calc Af Amer: 39 mL/min — ABNORMAL LOW (ref >60)
GFR, EST NON AFRICAN AMERICAN: 34 mL/min — AB (ref >60)
Glucose, Bld: 129 mg/dL — ABNORMAL HIGH (ref 65–99)
Potassium: 4.1 mmol/L (ref 3.5–5.1)
Sodium: 137 mmol/L (ref 135–145)
TOTAL PROTEIN: 7.1 g/dL (ref 6.5–8.1)

## 2017-07-12 LAB — ETHANOL: Alcohol, Ethyl (B): <10 mg/dL (ref <10)

## 2017-07-12 LAB — DIFFERENTIAL
BASOS ABS: 0 10*3/uL (ref 0.0–0.1)
BASOS PCT: 0 %
EOS ABS: 0.1 10*3/uL (ref 0.0–0.7)
Eosinophils Relative: 1 %
LYMPHS ABS: 2.2 10*3/uL (ref 0.7–4.0)
Lymphocytes Relative: 29 %
MONOS PCT: 4 %
Monocytes Absolute: 0.3 10*3/uL (ref 0.1–1.0)
NEUTROS ABS: 5 10*3/uL (ref 1.7–7.7)
NEUTROS PCT: 66 %

## 2017-07-12 LAB — PROTIME-INR
INR: 1.35
Prothrombin Time: 16.6 seconds — ABNORMAL HIGH (ref 11.4–15.2)

## 2017-07-12 LAB — I-STAT TROPONIN, ED: TROPONIN I, POC: 0.01 ng/mL (ref 0.00–0.08)

## 2017-07-12 LAB — APTT: APTT: 34 s (ref 24–36)

## 2017-07-12 MED ORDER — VITAMIN B-12 100 MCG PO TABS
100.0000 ug | ORAL_TABLET | Freq: Every day | ORAL | Status: DC
  Administered 2017-07-13: 100 ug via ORAL
  Filled 2017-07-12: qty 1

## 2017-07-12 MED ORDER — RESOURCE THICKENUP CLEAR PO POWD
ORAL | Status: DC | PRN
  Filled 2017-07-12: qty 125

## 2017-07-12 MED ORDER — NORTRIPTYLINE HCL 10 MG PO CAPS
20.0000 mg | ORAL_CAPSULE | Freq: Every day | ORAL | Status: DC
  Administered 2017-07-12: 20 mg via ORAL
  Filled 2017-07-12 (×2): qty 2

## 2017-07-12 MED ORDER — LEVOTHYROXINE SODIUM 88 MCG PO TABS: 88.0000 ug | ORAL_TABLET | Freq: Every day | ORAL | 1 refills | Status: DC

## 2017-07-12 MED ORDER — STROKE: EARLY STAGES OF RECOVERY BOOK
Freq: Once | Status: AC
  Administered 2017-07-12

## 2017-07-12 MED ORDER — CALCIUM POLYCARBOPHIL 625 MG PO TABS
1250.0000 mg | ORAL_TABLET | Freq: Every day | ORAL | Status: DC
  Administered 2017-07-13: 1250 mg via ORAL
  Filled 2017-07-12: qty 2

## 2017-07-12 MED ORDER — HYDRALAZINE HCL 25 MG PO TABS
25.0000 mg | ORAL_TABLET | Freq: Three times a day (TID) | ORAL | Status: DC
  Administered 2017-07-12 – 2017-07-13 (×3): 25 mg via ORAL
  Filled 2017-07-12 (×3): qty 1

## 2017-07-12 MED ORDER — LEVOTHYROXINE SODIUM 88 MCG PO TABS
88.0000 ug | ORAL_TABLET | Freq: Every day | ORAL | Status: DC
  Administered 2017-07-13: 88 ug via ORAL
  Filled 2017-07-12: qty 1

## 2017-07-12 MED ORDER — FLUOXETINE HCL 20 MG PO CAPS
20.0000 mg | ORAL_CAPSULE | Freq: Every day | ORAL | Status: DC
  Administered 2017-07-13: 20 mg via ORAL
  Filled 2017-07-12: qty 1

## 2017-07-12 MED ORDER — ACETAMINOPHEN 325 MG PO TABS: 650.0000 mg | ORAL_TABLET | ORAL | Status: DC | PRN

## 2017-07-12 MED ORDER — AMLODIPINE BESYLATE 5 MG PO TABS
5.0000 mg | ORAL_TABLET | Freq: Every day | ORAL | Status: DC
  Administered 2017-07-13: 5 mg via ORAL
  Filled 2017-07-12: qty 1

## 2017-07-12 MED ORDER — ACETAMINOPHEN 160 MG/5ML PO SOLN: 650.0000 mg | ORAL | Status: DC | PRN

## 2017-07-12 MED ORDER — APIXABAN 5 MG PO TABS
5.0000 mg | ORAL_TABLET | Freq: Two times a day (BID) | ORAL | Status: DC
  Administered 2017-07-12 – 2017-07-13 (×2): 5 mg via ORAL
  Filled 2017-07-12 (×2): qty 1

## 2017-07-12 MED ORDER — METOPROLOL TARTRATE 12.5 MG HALF TABLET
12.5000 mg | ORAL_TABLET | Freq: Two times a day (BID) | ORAL | Status: DC
  Administered 2017-07-12 – 2017-07-13 (×2): 12.5 mg via ORAL
  Filled 2017-07-12 (×2): qty 1

## 2017-07-12 MED ORDER — MORPHINE SULFATE ER 15 MG PO TBCR
15.0000 mg | EXTENDED_RELEASE_TABLET | Freq: Two times a day (BID) | ORAL | Status: DC
  Administered 2017-07-12 – 2017-07-13 (×2): 15 mg via ORAL
  Filled 2017-07-12 (×2): qty 1

## 2017-07-12 MED ORDER — POTASSIUM CHLORIDE CRYS ER 20 MEQ PO TBCR
20.0000 meq | EXTENDED_RELEASE_TABLET | Freq: Once | ORAL | Status: AC
  Administered 2017-07-12: 20 meq via ORAL
  Filled 2017-07-12: qty 1

## 2017-07-12 MED ORDER — ACETAMINOPHEN 650 MG RE SUPP: 650.0000 mg | RECTAL | Status: DC | PRN

## 2017-07-12 MED ORDER — ATORVASTATIN CALCIUM 10 MG PO TABS: 5.0000 mg | ORAL_TABLET | Freq: Every day | ORAL | Status: DC

## 2017-07-12 MED ORDER — ATORVASTATIN CALCIUM 80 MG PO TABS: 80.0000 mg | ORAL_TABLET | Freq: Every day | ORAL | Status: DC

## 2017-07-12 NOTE — H&P (Signed)
History and Physical    Valerie Mcclure WCH:[id number] DOB: 1941-05-29 DOA: 07/12/2017  PCP: [name], MD  Patient coming from: Home.  Chief Complaint: Right facial droop and garbled speech.  HPI: Valerie Mcclure is a 76 y.o. female with previous history of embolic CVA in July 3536, atrial fibrillation, hypertension, hypothyroidism was brought to the ER after patient was found to be increasingly confused with garbled speech and difficulty walking and right facial droop.  Patient as per the family has been having over the last 2 days confusion and dropping things.  This afternoon around 2 PM family noticed right facial droop and garbled speech and patient was brought to the ER.  ED Course: In the ER CT head was unremarkable and neurology on call was consulted and since patient symptoms have been more than 24 hours was not felt to be a candidate for TPA.  Patient admitted for further management of possible TIA versus seizures or stroke.  Review of Systems: As per HPI, rest all negative.   Past Medical History:  Diagnosis Date  . Blood transfusion without reported diagnosis   . Chronic low back pain   . Hyperlipidemia   . Osteopenia   . Stroke ([place])   . Unspecified essential hypertension   . Unspecified hypothyroidism     Past Surgical History:  Procedure Laterality Date  . ABDOMINAL HYSTERECTOMY  1970  . CHOLECYSTECTOMY  07/2009   Dr. Rise Patience  . COLONOSCOPY  2003  . FLEXIBLE SIGMOIDOSCOPY  2010  . HAND SURGERY    . IR ANGIO INTRA EXTRACRAN SEL COM CAROTID INNOMINATE UNI L MOD SED  11/29/2016  . IR ANGIO VERTEBRAL SEL SUBCLAVIAN INNOMINATE UNI R MOD SED  11/29/2016  . IR PERCUTANEOUS ART THROMBECTOMY/INFUSION INTRACRANIAL INC DIAG ANGIO  11/29/2016  . IR RADIOLOGIST EVAL & MGMT  03/24/2017  . LUMBAR LAMINECTOMY  11/2008   Done by Dr. Patrice Paradise  . RADIOLOGY WITH ANESTHESIA N/A 11/29/2016   Procedure: RADIOLOGY WITH ANESTHESIA;  Surgeon: Radiologist, Medication, MD;   Location: [facility];  Service: Radiology;  Laterality: N/A;  . THYROIDECTOMY       reports that  has never smoked. she has never used smokeless tobacco. She reports that she does not drink alcohol or use drugs.  Allergies  Allergen Reactions  . Shrimp [Shellfish Allergy] Anaphylaxis and Rash    Break outs and swelling of the throat  . Valsartan Other (See Comments)    Renal failure  . Tandem Plus [Fefum-Fepo-Fa-B Cmp-C-Zn-Mn-Cu] Nausea And Vomiting  . Ivp Dye [Iodinated Diagnostic Agents] Rash    itching  . Penicillins Itching and Rash    Has patient had a PCN reaction causing immediate rash, facial/tongue/throat swelling, SOB or lightheadedness with hypotension: Yes Has patient had a PCN reaction causing severe rash involving mucus membranes or skin necrosis: No Has patient had a PCN reaction that required hospitalization: No Has patient had a PCN reaction occurring within the last 10 years: No If all of the above answers are "NO", then may proceed with Cephalosporin use.     Family History  Problem Relation Age of Onset  . Heart disease Father 67       MI age 67s  . Lung cancer Brother 38  . Colon cancer Neg Hx   . Esophageal cancer Neg Hx   . Rectal cancer Neg Hx   . Stomach cancer Neg Hx     Prior to Admission medications   Medication Sig Start Date End  Date Taking? Authorizing Provider  amLODipine (NORVASC) 10 MG tablet Take 0.5 tablets (5 mg total) by mouth daily. 04/09/17  Yes Burns, Claudina Lick, MD  apixaban (ELIQUIS) 5 MG TABS tablet Take 1 tablet (5 mg total) by mouth 2 (two) times daily. 02/15/17  Yes Burns, Claudina Lick, MD  atorvastatin (LIPITOR) 10 MG tablet Take 0.5 tablets (5 mg total) by mouth daily at 6 PM. 03/03/17 03/03/18 Yes Burns, Claudina Lick, MD  Calcium Carbonate-Vit D-Min (CALCIUM 1200 PO) Take 1 tablet by mouth daily.    Yes [provider]  Cholecalciferol (VITAMIN D3) 1000 UNITS CAPS Take 1,000 Units by mouth daily.    Yes [provider]    FLUoxetine (PROZAC) 20 MG capsule Take 20 mg by mouth daily.   Yes [provider]  hydrALAZINE (APRESOLINE) 25 MG tablet TAKE 1 TABLET(25 MG) BY MOUTH THREE TIMES DAILY 07/03/17  Yes Burns, Claudina Lick, MD  levothyroxine (SYNTHROID, LEVOTHROID) 88 MCG tablet Take 1 tablet (88 mcg total) by mouth daily. 07/12/17  Yes Burns, Claudina Lick, MD  Maltodextrin-Xanthan Gum ([place]) POWD Use as needed to get liquid to nectar thick consistency 01/19/17  Yes Love, Ivan Anchors, PA-C  metoprolol tartrate (LOPRESSOR) 25 MG tablet Take 0.5 tablets (12.5 mg total) by mouth 2 (two) times daily. 04/19/17  Yes Burns, Claudina Lick, MD  morphine (MS CONTIN) 15 MG 12 hr tablet Take 15 mg by mouth every 12 (twelve) hours. 06/16/17  Yes [provider]  nortriptyline (PAMELOR) 10 MG capsule TAKE 2 CAPSULES BY MOUTH AT BEDTIME 02/02/17  Yes Burns, Claudina Lick, MD  polycarbophil (FIBERCON) 625 MG tablet Take 2 tablets (1,250 mg total) by mouth daily. 01/20/17  Yes Love, Ivan Anchors, PA-C  potassium chloride SA (K-DUR,KLOR-CON) 20 MEQ tablet Take 1 tablet (20 mEq total) by mouth once. 03/03/17 07/12/17 Yes Burns, Claudina Lick, MD  vitamin B-12 (CYANOCOBALAMIN) 100 MCG tablet Take 100 mcg by mouth daily.   Yes [provider]    Physical Exam: Vitals:   07/12/17 1915 07/12/17 1945 07/12/17 2000 07/12/17 2100  BP: (!) 146/64 (!) 135/91 133/69 (!) 147/88  Pulse: (!) 59 (!) 52 (!) 57 67  Resp: 17 13 15 16   Temp:    98.1 F (36.7 C)  TempSrc:    Oral  SpO2: 97% 99% 97% 100%  Weight:    69.8 kg (153 lb 15.5 oz)  Height:    5\' 6"  (1.676 m)      Constitutional: Moderately built and nourished. Vitals:   07/12/17 1915 07/12/17 1945 07/12/17 2000 07/12/17 2100  BP: (!) 146/64 (!) 135/91 133/69 (!) 147/88  Pulse: (!) 59 (!) 52 (!) 57 67  Resp: 17 13 15 16   Temp:    98.1 F (36.7 C)  TempSrc:    Oral  SpO2: 97% 99% 97% 100%  Weight:    69.8 kg (153 lb 15.5 oz)  Height:    5\' 6"  (1.676 m)   Eyes:  Anicteric no pallor. ENMT: No discharge from the ears eyes nose or mouth. Neck: No mass felt.  No neck rigidity. Respiratory: No rhonchi or crepitations. Cardiovascular: S1-S2 heard no murmurs appreciated. Abdomen: Soft nontender bowel sounds present. Musculoskeletal: No edema.  No joint effusion. Skin: No rash.  Skin appears warm. Neurologic: Alert awake oriented to time place and person.  Has mild weakness of the right upper and lower extremities from previous stroke.  No facial droop pupils are equal and reacting to light tongue is  midline.  Patient is able to walk with assistance. Psychiatric: Appears normal.  Normal affect.   Labs on Admission: I have personally reviewed following labs and imaging studies  CBC: Recent Labs  Lab 07/12/17 1755 07/12/17 1813  WBC 7.6  --   NEUTROABS 5.0  --   HGB 12.2 12.6  HCT 37.2 37.0  MCV 89.2  --   PLT 336  --    Basic Metabolic Panel: Recent Labs  Lab 07/12/17 1755 07/12/17 1813  NA 137 139  K 4.1 4.1  CL 103 101  CO2 26  --   GLUCOSE 129* 127*  BUN 14 16  CREATININE 1.44* 1.40*  CALCIUM 9.0  --    GFR: Estimated Creatinine Clearance: 31.5 mL/min (A) (by C-G formula based on SCr of 1.4 mg/dL (H)). Liver Function Tests: Recent Labs  Lab 07/12/17 1755  AST 21  ALT 11*  ALKPHOS 89  BILITOT 0.7  PROT 7.1  ALBUMIN 3.6   No results for input(s): LIPASE, AMYLASE in the last 168 hours. No results for input(s): AMMONIA in the last 168 hours. Coagulation Profile: Recent Labs  Lab 07/12/17 1755  INR 1.35   Cardiac Enzymes: No results for input(s): CKTOTAL, CKMB, CKMBINDEX, TROPONINI in the last 168 hours. BNP (last 3 results) No results for input(s): PROBNP in the last 8760 hours. HbA1C: No results for input(s): HGBA1C in the last 72 hours. CBG: No results for input(s): GLUCAP in the last 168 hours. Lipid Profile: No results for input(s): CHOL, HDL, LDLCALC, TRIG, CHOLHDL, LDLDIRECT in the last 72 hours. Thyroid  Function Tests: No results for input(s): TSH, T4TOTAL, FREET4, T3FREE, THYROIDAB in the last 72 hours. Anemia Panel: No results for input(s): VITAMINB12, FOLATE, FERRITIN, TIBC, IRON, RETICCTPCT in the last 72 hours. Urine analysis:    Component Value Date/Time   COLORURINE YELLOW 01/12/2017 0053   APPEARANCEUR CLEAR 01/12/2017 0053   LABSPEC 1.010 01/12/2017 0053   PHURINE 5.0 01/12/2017 0053   GLUCOSEU NEGATIVE 01/12/2017 0053   GLUCOSEU NEGATIVE 11/18/2010 0859   HGBUR NEGATIVE 01/12/2017 0053   HGBUR negative 12/13/2008 0944   BILIRUBINUR NEGATIVE 01/12/2017 0053   KETONESUR NEGATIVE 01/12/2017 0053   PROTEINUR NEGATIVE 01/12/2017 0053   UROBILINOGEN 0.2 11/18/2010 0859   NITRITE NEGATIVE 01/12/2017 0053   LEUKOCYTESUR NEGATIVE 01/12/2017 0053   Sepsis Labs: @LABRCNTIP (procalcitonin:4,lacticidven:4) )No results found for this or any previous visit (from the past 240 hour(s)).   Radiological Exams on Admission: Ct Head Code Stroke Wo Contrast  Result Date: 07/12/2017 CLINICAL DATA:  Code stroke. RIGHT-sided facial droop, RIGHT-sided weakness and aphasia. History of stroke, hypertension and hyperlipidemia. EXAM: CT HEAD WITHOUT CONTRAST TECHNIQUE: Contiguous axial images were obtained from the base of the skull through the vertex without intravenous contrast. COMPARISON:  MRI of the head January 12, 2017 FINDINGS: BRAIN: No intraparenchymal hemorrhage, mass effect nor midline shift. Old LEFT basal ganglia infarct with ex vacuo dilatation LEFT lateral ventricle. LEFT temporal lobe encephalomalacia. No hydrocephalus. LEFT cerebral peduncle volume loss consistent with wallerian degeneration. Patchy pontine and minimal supratentorial white matter hypodensities compatible with chronic small vessel ischemic disease, less than expected for age. No acute large vascular territory infarct. No abnormal extra-axial fluid collections. VASCULAR: Mild calcific atherosclerosis of the carotid siphons.  SKULL: No skull fracture. No significant scalp soft tissue swelling. SINUSES/ORBITS: The mastoid air-cells and included paranasal sinuses are well-aerated.The included ocular globes and orbital contents are non-suspicious. OTHER: None. ASPECTS [facility] Stroke Program Early CT Score) - Ganglionic level infarction (  caudate, lentiform nuclei, internal capsule, insula, M1-M3 cortex): 7 - Supraganglionic infarction (M4-M6 cortex): 3 Total score (0-10 with 10 being normal): 10 IMPRESSION: 1. No acute intracranial process. 2. Old LEFT MCA territory infarct involving LEFT basal ganglia and LEFT temporal lobe. 3. ASPECTS is 10 . 4. Acute findings text paged to [place], Neurology via [place] system on 07/12/2017 at 6:30 pm including interpreting physician phone number. Electronically Signed   By: Elon Alas M.D.   On: 07/12/2017 18:34    EKG: Independently reviewed.  A. fib rate controlled.  Assessment/Plan Principal Problem:   TIA (transient ischemic attack) Active Problems:   Hypothyroidism   CKD (chronic kidney disease)   Anemia of chronic disease   Benign essential HTN   Global aphasia   Chronic diastolic heart failure ([name])   PAF (paroxysmal atrial fibrillation) ([place])    1. TIA versus stroke versus seizures -appreciate neurology consult.  Patient is placed on neurochecks.  Patient passed swallow evaluation.  Patient is on apixaban which will be continued as per the neurology recommendations.  Patient's Lipitor dose has been increased to 80 mg full dose.  MRI/MRA brain 2D echo and EEG has been ordered.  Physical therapy consult. 2. Hypertension on amlodipine metoprolol and hydralazine. 3. Chronic atrial fibrillation on apixaban and metoprolol.  Rate controlled.  Chads 2 vasc score is more than 2. 4. Chronic kidney disease stage III creatinine appears to be at baseline. 5. Chronic anemia likely from renal disease also on B12 supplements.   DVT prophylaxis: Apixaban. Code Status: Full  code. Family Communication: Husband and son. Disposition Plan: Home. Consults called: Neurology. Admission status: Observation.   Rise Patience MD [facility] Pager [phone].  If 7PM-7AM, please contact night-coverage [email or web address] Password [facility]  07/12/2017, 9:26 PM

## 2017-07-12 NOTE — Telephone Encounter (Signed)
Copied from [place] [id number]. Topic: Quick Communication - Rx Refill/Question >> Jul 12, 2017 11:27 AM Carolyn Stare wrote:  Has the patient contacted their pharmacy YES      levothyroxine (SYNTHROID, LEVOTHROID) 43 MCG tablet  Preferred Pharmacy (with phone number or street name  [facility]      Agent: Please be advised that RX refills may take up to 3 business days. We ask that you follow-up with your pharmacy.

## 2017-07-12 NOTE — Telephone Encounter (Signed)
LOV 06-15-17 with Dr. Quay Burow /  Disp Refills Start End   levothyroxine (SYNTHROID, LEVOTHROID) 88 MCG tablet 90 tablet 1 07/12/2017    Sig - Route: Take 1 tablet (88 mcg total) by mouth daily. - Oral   Sent to pharmacy as: levothyroxine (SYNTHROID, LEVOTHROID) 88 MCG tablet   E-Prescribing Status: Receipt confirmed by pharmacy (07/12/2017 12:23 PM EST)

## 2017-07-12 NOTE — Consult Note (Addendum)
Neurology Consultation  Reason for Consult: acute code stroke Referring Physician: Darl Householder, MD  CC: More confused, right facial droop  History is obtained from: Patient's husband and daughter at bedside  HPI: Valerie Mcclure is a 76 y.o. female who is a past medical history of an embolic stroke in the left MCA territory in June 2018 with residual aphasia and mild residual right hemiparesis, hyperlipidemia, atrial fibrillation on Eliquis, who was in her usual state of health until about 1-1/2-2 days ago when the family started noticing that she has at times been more confused and unable to get her words out much worse than her baseline.  After the last stroke, she was left with moderate expressive aphasia and inability to form complete sentences.  She was doing well in terms of her motor strength with minimal right hemiparesis.  She did not have any facial droop that was residual. This afternoon, around 2 PM, the patient's husband noted that she had right facial droop and her speech was more garbled than what had been her baseline since the last stroke. He also noted that she has been dropping things more over the past day or 2 from both hands. No preceding fevers or chills.  No preceding complaints of urinary urgency or frequency or burning.  No cough shortness of breath.  No chest pain palpitations. The patient took her last dose of Eliquis this morning.  LKW: 2 days ago tpa given?: no, on Eliquis-last dose less than 12 hours ago.,  Outside the window also. Premorbid modified Rankin scale (mRS): 3 ROS: A 14 point ROS was performed and is negative except as noted in the HPI.  This was provided by the family members at bedside  Past Medical History:  Diagnosis Date  . Blood transfusion without reported diagnosis   . Chronic low back pain   . Hyperlipidemia   . Osteopenia   . Stroke ([place])   . Unspecified essential hypertension   . Unspecified hypothyroidism     Family History  Problem  Relation Age of Onset  . Heart disease Father 37       MI age 62s  . Lung cancer Brother 56  . Colon cancer Neg Hx   . Esophageal cancer Neg Hx   . Rectal cancer Neg Hx   . Stomach cancer Neg Hx     Social History:   reports that  has never smoked. she has never used smokeless tobacco. She reports that she does not drink alcohol or use drugs.   Medications No current facility-administered medications for this encounter.   Current Outpatient Medications:  .  amLODipine (NORVASC) 10 MG tablet, Take 0.5 tablets (5 mg total) by mouth daily., Disp: 45 tablet, Rfl: 1 .  apixaban (ELIQUIS) 5 MG TABS tablet, Take 1 tablet (5 mg total) by mouth 2 (two) times daily., Disp: 60 tablet, Rfl: 5 .  atorvastatin (LIPITOR) 10 MG tablet, Take 0.5 tablets (5 mg total) by mouth daily at 6 PM., Disp: 45 tablet, Rfl: 1 .  Calcium Carbonate-Vit D-Min (CALCIUM 1200 PO), Take 1 tablet by mouth daily. , Disp: , Rfl:  .  Cholecalciferol (VITAMIN D3) 1000 UNITS CAPS, Take 1,000 Units by mouth daily. , Disp: , Rfl:  .  FLUoxetine (PROZAC) 20 MG capsule, Take 20 mg by mouth daily., Disp: , Rfl:  .  hydrALAZINE (APRESOLINE) 25 MG tablet, TAKE 1 TABLET(25 MG) BY MOUTH THREE TIMES DAILY, Disp: 90 tablet, Rfl: 5 .  levothyroxine (SYNTHROID, LEVOTHROID) 88 MCG  tablet, Take 1 tablet (88 mcg total) by mouth daily., Disp: 90 tablet, Rfl: 1 .  Maltodextrin-Xanthan Gum (RESOURCE THICKENUP CLEAR) POWD, Use as needed to get liquid to nectar thick consistency, Disp: 3 Can, Rfl: 1 .  metoprolol tartrate (LOPRESSOR) 25 MG tablet, Take 0.5 tablets (12.5 mg total) by mouth 2 (two) times daily., Disp: 90 tablet, Rfl: 1 .  nortriptyline (PAMELOR) 10 MG capsule, TAKE 2 CAPSULES BY MOUTH AT BEDTIME, Disp: 180 capsule, Rfl: 1 .  polycarbophil (FIBERCON) 625 MG tablet, Take 2 tablets (1,250 mg total) by mouth daily., Disp: 60 tablet, Rfl: 0 .  potassium chloride SA (K-DUR,KLOR-CON) 20 MEQ tablet, Take 1 tablet (20 mEq total) by mouth  once., Disp: 90 tablet, Rfl: 1 .  traMADol (ULTRAM) 50 MG tablet, Take 1 tablet (50 mg total) by mouth every 8 (eight) hours as needed., Disp: 60 tablet, Rfl: 0 .  vitamin B-12 (CYANOCOBALAMIN) 100 MCG tablet, Take 100 mcg by mouth daily., Disp: , Rfl:   Exam: Current vital signs: BP 138/75   Pulse 64   Temp 98.7 F (37.1 C) (Oral)   Resp 20   Ht 5\' 6"  (1.676 m)   Wt 69.9 kg (154 lb)   SpO2 99%   BMI 24.86 kg/m  Vital signs in last 24 hours: Temp:  [98.7 F (37.1 C)] 98.7 F (37.1 C) (12/24 1749) Pulse Rate:  [59-69] 64 (12/24 1830) Resp:  [16-20] 20 (12/24 1830) BP: (138-142)/(52-97) 138/75 (12/24 1830) SpO2:  [98 %-99 %] 99 % (12/24 1830) Weight:  [69.9 kg (154 lb)] 69.9 kg (154 lb) (12/24 1749)  GENERAL: Awake, alert in NAD HEENT: - Normocephalic and atraumatic, dry mm, no LN++, no Thyromegally LUNGS - Clear to auscultation bilaterally with no wheezes CV - S1S2 RRR, no m/r/g, equal pulses bilaterally. ABDOMEN - Soft, nontender, nondistended with normoactive BS Ext: warm, well perfused, intact peripheral pulses, no edema  NEURO:  Mental Status: Awake, alert, oriented to self and hospital.  Not oriented to date or time. Language: speech is mildly dysarthric.  Naming, repetition, fluency,impaired.  Moderate expressive aphasia, follows all commands.  Able to name 2 objects on the NIH score card.  Unable to describe the picture.  Unable to repeat. Cranial Nerves: PERRL EOMI, visual fields full, no facial asymmetry facial sensation intact, hearing intact, tongue/uvula/soft palate midline, normal sternocleidomastoid and trapezius muscle strength. No evidence of tongue atrophy or fibrillations Motor: Grossly symmetric antigravity strength in both upper extremities with a mild right pronator drift present but no vertical drift. Tone: is normal and bulk is normal Sensation- Intact to light touch bilaterally Coordination: FTN intact bilaterally Gait- deferred  NIHSS 1a Level of  Conscious.: 0 1b LOC Questions: 1 1c LOC Commands: 0 2 Best Gaze: 0 3 Visual: 0 4 Facial Palsy: 0 5a Motor Arm - left: 0 5b Motor Arm - Right: 0 6a Motor Leg - Left: 0 6b Motor Leg - Right: 0 7 Limb Ataxia: 0 8 Sensory: 0 9 Best Language: 2 10 Dysarthria: 1 11 Extinct. and Inatten.: 0 TOTAL: 4  Labs I have reviewed labs in epic and the results pertinent to this consultation are:  CBC    Component Value Date/Time   WBC 7.6 07/12/2017 1755   RBC 4.17 07/12/2017 1755   HGB 12.6 07/12/2017 1813   HCT 37.0 07/12/2017 1813   PLT 336 07/12/2017 1755   MCV 89.2 07/12/2017 1755   MCH 29.3 07/12/2017 1755   MCHC 32.8 07/12/2017 1755   RDW  13.4 07/12/2017 1755   LYMPHSABS 2.2 07/12/2017 1755   MONOABS 0.3 07/12/2017 1755   EOSABS 0.1 07/12/2017 1755   BASOSABS 0.0 07/12/2017 1755    CMP     Component Value Date/Time   NA 139 07/12/2017 1813   NA 140 02/12/2017 1412   K 4.1 07/12/2017 1813   CL 101 07/12/2017 1813   CO2 23 04/08/2017 2316   GLUCOSE 127 (H) 07/12/2017 1813   BUN 16 07/12/2017 1813   BUN 16 02/12/2017 1412   CREATININE 1.40 (H) 07/12/2017 1813   CALCIUM 9.6 04/08/2017 2316   PROT 5.3 (L) 01/15/2017 0518   ALBUMIN 2.6 (L) 01/15/2017 0518   AST 13 (L) 01/15/2017 0518   ALT 18 01/15/2017 0518   ALKPHOS 75 01/15/2017 0518   BILITOT 0.4 01/15/2017 0518   GFRNONAA 36 (L) 04/08/2017 2316   GFRAA 42 (L) 04/08/2017 2316    Imaging I have reviewed the images obtained:  CT-scan of the brain -noncontrast-no acute changes.  Evidence of old left MCA territory encephalomalacia and left basal ganglia and cephalo-malacia.  Assessment:  76 year old woman with past history of embolic stroke in the left MCA territory status post TPA and IA with residual mild right hemiparesis and aphasia, hyperlipidemia, atrial fibrillation on Eliquis presenting for evaluation of intermittent worsening of her aphasia as well as intermittent right facial droop. On my examination, she  did have the aphasia but I did not appreciate any facial weakness. Initial report at the emergency room to the ED providers in triage was that her last seen 2 PM, but on further inquiry, the family reports that her symptoms have now been going on for at least a day and a half. She is on Eliquis with the last dose taken this morning.  Hence she is not a candidate for IV TPA for that reason as well as being outside the window. Her stroke scale is 4.  Her baseline modified Rankin score is 3.  She has a listed allergy to dye, which the family is not sure if it is true since she got a CTA and interventional radiology procedure for thrombectomy in June.  But I do not appreciate a lot of change from her baseline, hence I did not pursue CT angiogram.  We can obtain an MRI and MRA. Her symptoms are related to a new stroke or worsening of the old stroke-stroke recrudescence in the setting of systemic illness.  Another consideration is seizures.  Impression: Evaluate for new stroke Evaluate for seizures Evaluate for stroke recrudescence  Recommendations: -Admit to hospitalist -Telemetry monitoring -Allow for permissive hypertension for the first 24-48h - only treat PRN if SBP >220 mmHg. Blood pressures can be gradually normalized to SBP<140 upon discharge. -MRI, MRA  of the brain without contrast -Echocardiogram -HgbA1c, fasting lipid panel -Frequent neuro checks -Prophylactic therapy-c/w Eliquis -Atorvastatin 80 mg PO daily -Risk factor modification -PT consult, OT consult, Speech consult -Routine EEG. No AED for now. -Check UA and CXR  Please page stroke NP/PA/MD (listed on AMION)  from 8am-4 pm as this patient will be followed by the stroke team at this point.  -- Amie Portland, MD [facility] [phone] If 7pm to 7am, please call on call as listed on AMION.

## 2017-07-12 NOTE — ED Notes (Signed)
Pt in X-Ray at 18:24; family at bedside.

## 2017-07-12 NOTE — ED Triage Notes (Signed)
Per EMS pt from home husband called out due to altered mental status. Husband states patient seems "off" and is more confused then baseline and notes face seems off but unable to specifically describe differences. Husband states that last seen normal was at 2pm. No facial droop, weakness, paresthesia, blurry vision, aphasia. Pt has some slurred and garbled speech that patient states is normal since previous stroke. Patient was resistant to come to ED initially.

## 2017-07-12 NOTE — ED Provider Notes (Signed)
[facility] EMERGENCY DEPARTMENT Provider Note   CSN: [id number] Arrival date & time: 07/12/17  1736     History   Chief Complaint Chief Complaint  Patient presents with  . Altered Mental Status    HPI Valerie Mcclure is a 76 y.o. female history of recent stroke with residual right-sided weakness and slurred speech who presented with worsening weakness, slurred speech.  Patient told me that she was feeling okay but per the husband, she took her nap around 2 PM today and woke about 4:30 PM.  When she woke up around 4:30 PM, he noticed that she has worsening right facial droop as well as well right-sided weakness.  Patient also has worsening slurred speech.  However this is similar to her previous stroke symptoms.   The history is provided by the patient and the spouse.    Past Medical History:  Diagnosis Date  . Blood transfusion without reported diagnosis   . Chronic low back pain   . Hyperlipidemia   . Osteopenia   . Stroke ([place])   . Unspecified essential hypertension   . Unspecified hypothyroidism     Patient Active Problem List   Diagnosis Date Noted  . Bradycardia 04/12/2017  . Depression 01/29/2017  . Fall   . Physical deconditioning   . Aphasia as late effect of cerebrovascular accident (CVA)   . PAF (paroxysmal atrial fibrillation) ([place])   . Chronic diastolic heart failure ([place])   . Dysarthria, post-stroke   . Hypoalbuminemia due to protein-calorie malnutrition ([place])   . Global aphasia   . Encephalopathy 12/23/2016  . Acute blood loss anemia   . Tachypnea   . Toxic encephalopathy 12/22/2016  . Atrial flutter ([place]) 12/22/2016  . Hypokalemia 12/21/2016  . Obesity (BMI 30-39.9) 12/21/2016  . Spastic hemiplegia of right dominant side as late effect of cerebral infarction ([place])   . E-coli UTI   . Anemia of chronic disease   . Benign essential HTN   . Dysphagia, post-stroke   . Anterior cerebral circulation hemorrhagic infarction  12/03/2016  . PAC (premature atrial contraction) 12/03/2016  . Acute ischemic left MCA stroke ([place]) 12/03/2016  . Aphasia as late effect of stroke 12/03/2016  . Right hemiparesis ([place])   . Nontraumatic subcortical hemorrhage of left cerebral hemisphere ([place])   . Acute embolic stroke ([place]) 17/61/6073  . Mild aortic regurgitation 08/20/2016  . Bilateral edema of lower extremity 04/07/2016  . Pancreatic duct dilated 01/09/2016  . CKD (chronic kidney disease) 12/25/2015  . Mild tricuspid regurgitation 12/25/2015  . Mild mitral regurgitation 12/25/2015  . Prediabetes 06/10/2015  . Edema 06/04/2015  . Abdominal pain, chronic, right upper quadrant 06/04/2015  . Chronic low back pain   . PAIN IN THORACIC SPINE 04/18/2010  . Coronary atherosclerosis 01/24/2009  . DEGENERATIVE JOINT DISEASE 01/23/2009  . ARTHRITIS, LEFT KNEE 12/13/2008  . HERNIATED LUMBAR DISC 12/13/2008  . Hypothyroidism 11/21/2007  . Hyperlipidemia 11/09/2007  . ANEMIA 11/09/2007  . Osteopenia 11/09/2007    Past Surgical History:  Procedure Laterality Date  . ABDOMINAL HYSTERECTOMY  1970  . CHOLECYSTECTOMY  07/2009   Dr. Rise Patience  . COLONOSCOPY  2003  . FLEXIBLE SIGMOIDOSCOPY  2010  . HAND SURGERY    . IR ANGIO INTRA EXTRACRAN SEL COM CAROTID INNOMINATE UNI L MOD SED  11/29/2016  . IR ANGIO VERTEBRAL SEL SUBCLAVIAN INNOMINATE UNI R MOD SED  11/29/2016  . IR PERCUTANEOUS ART THROMBECTOMY/INFUSION INTRACRANIAL INC DIAG ANGIO  11/29/2016  . IR RADIOLOGIST  EVAL & MGMT  03/24/2017  . LUMBAR LAMINECTOMY  11/2008   Done by Dr. Patrice Paradise  . RADIOLOGY WITH ANESTHESIA N/A 11/29/2016   Procedure: RADIOLOGY WITH ANESTHESIA;  Surgeon: Radiologist, Medication, MD;  Location: [facility];  Service: Radiology;  Laterality: N/A;  . THYROIDECTOMY      OB History    No data available       Home Medications    Prior to Admission medications   Medication Sig Start Date End Date Taking? Authorizing Provider  amLODipine (NORVASC) 10 MG  tablet Take 0.5 tablets (5 mg total) by mouth daily. 04/09/17  Yes Burns, Claudina Lick, MD  apixaban (ELIQUIS) 5 MG TABS tablet Take 1 tablet (5 mg total) by mouth 2 (two) times daily. 02/15/17  Yes Burns, Claudina Lick, MD  atorvastatin (LIPITOR) 10 MG tablet Take 0.5 tablets (5 mg total) by mouth daily at 6 PM. 03/03/17 03/03/18 Yes Burns, Claudina Lick, MD  Calcium Carbonate-Vit D-Min (CALCIUM 1200 PO) Take 1 tablet by mouth daily.    Yes [provider]  Cholecalciferol (VITAMIN D3) 1000 UNITS CAPS Take 1,000 Units by mouth daily.    Yes [provider]  FLUoxetine (PROZAC) 20 MG capsule Take 20 mg by mouth daily.   Yes [provider]  hydrALAZINE (APRESOLINE) 25 MG tablet TAKE 1 TABLET(25 MG) BY MOUTH THREE TIMES DAILY 07/03/17  Yes Burns, Claudina Lick, MD  levothyroxine (SYNTHROID, LEVOTHROID) 88 MCG tablet Take 1 tablet (88 mcg total) by mouth daily. 07/12/17  Yes Burns, Claudina Lick, MD  Maltodextrin-Xanthan Gum ([place]) POWD Use as needed to get liquid to nectar thick consistency 01/19/17  Yes Love, Ivan Anchors, PA-C  metoprolol tartrate (LOPRESSOR) 25 MG tablet Take 0.5 tablets (12.5 mg total) by mouth 2 (two) times daily. 04/19/17  Yes Burns, Claudina Lick, MD  morphine (MS CONTIN) 15 MG 12 hr tablet Take 15 mg by mouth every 12 (twelve) hours. 06/16/17  Yes [provider]  nortriptyline (PAMELOR) 10 MG capsule TAKE 2 CAPSULES BY MOUTH AT BEDTIME 02/02/17  Yes Burns, Claudina Lick, MD  polycarbophil (FIBERCON) 625 MG tablet Take 2 tablets (1,250 mg total) by mouth daily. 01/20/17  Yes Love, Ivan Anchors, PA-C  potassium chloride SA (K-DUR,KLOR-CON) 20 MEQ tablet Take 1 tablet (20 mEq total) by mouth once. 03/03/17 07/12/17 Yes Burns, Claudina Lick, MD  vitamin B-12 (CYANOCOBALAMIN) 100 MCG tablet Take 100 mcg by mouth daily.   Yes [provider]    Family History Family History  Problem Relation Age of Onset  . Heart disease Father 63       MI age 29s  . Lung cancer Brother 19  .  Colon cancer Neg Hx   . Esophageal cancer Neg Hx   . Rectal cancer Neg Hx   . Stomach cancer Neg Hx     Social History Social History   Tobacco Use  . Smoking status: Never Smoker  . Smokeless tobacco: Never Used  Substance Use Topics  . Alcohol use: No  . Drug use: No     Allergies   Shrimp [shellfish allergy]; Valsartan; Tandem plus [fefum-fepo-fa-b cmp-c-zn-mn-cu]; Ivp dye [iodinated diagnostic agents]; and Penicillins   Review of Systems Review of Systems  Neurological: Positive for speech difficulty and weakness.  All other systems reviewed and are negative.    Physical Exam Updated Vital Signs BP 138/75   Pulse 64   Temp 98.5 F (36.9 C)   Resp 20   Ht 5\' 6"  (1.676  m)   Wt 69.9 kg (154 lb)   SpO2 99%   BMI 24.86 kg/m   Physical Exam  Constitutional: She appears well-developed.  HENT:  Head: Normocephalic.  Mouth/Throat: Oropharynx is clear and moist.  Eyes: Conjunctivae and EOM are normal. Pupils are equal, round, and reactive to light.  Neck: Normal range of motion. Neck supple.  Cardiovascular: Normal rate, regular rhythm and normal heart sounds.  Pulmonary/Chest: Effort normal and breath sounds normal. No stridor. No respiratory distress. She has no wheezes.  Abdominal: Soft. Bowel sounds are normal. She exhibits no distension. There is no tenderness. There is no guarding.  Musculoskeletal: Normal range of motion.  Neurological: She is alert.  No obvious facial droop. CN 2-12 intact. Strength 4/5 R arm and leg, 5/5 L side. Mild slurred speech   Skin: Skin is warm.  Psychiatric: She has a normal mood and affect.  Nursing note and vitals reviewed.    ED Treatments / Results  Labs (all labs ordered are listed, but only abnormal results are displayed) Labs Reviewed  PROTIME-INR - Abnormal; Notable for the following components:      Result Value   Prothrombin Time 16.6 (*)    All other components within normal limits  COMPREHENSIVE METABOLIC  PANEL - Abnormal; Notable for the following components:   Glucose, Bld 129 (*)    Creatinine, Ser 1.44 (*)    ALT 11 (*)    GFR calc non Af Amer 34 (*)    GFR calc Af Amer 39 (*)    All other components within normal limits  I-STAT CHEM 8, ED - Abnormal; Notable for the following components:   Creatinine, Ser 1.40 (*)    Glucose, Bld 127 (*)    Calcium, Ion 1.14 (*)    All other components within normal limits  ETHANOL  APTT  CBC  DIFFERENTIAL  RAPID URINE DRUG SCREEN, HOSP PERFORMED  URINALYSIS, ROUTINE W REFLEX MICROSCOPIC  I-STAT TROPONIN, ED    EKG  EKG Interpretation None       Radiology Ct Head Code Stroke Wo Contrast  Result Date: 07/12/2017 CLINICAL DATA:  Code stroke. RIGHT-sided facial droop, RIGHT-sided weakness and aphasia. History of stroke, hypertension and hyperlipidemia. EXAM: CT HEAD WITHOUT CONTRAST TECHNIQUE: Contiguous axial images were obtained from the base of the skull through the vertex without intravenous contrast. COMPARISON:  MRI of the head January 12, 2017 FINDINGS: BRAIN: No intraparenchymal hemorrhage, mass effect nor midline shift. Old LEFT basal ganglia infarct with ex vacuo dilatation LEFT lateral ventricle. LEFT temporal lobe encephalomalacia. No hydrocephalus. LEFT cerebral peduncle volume loss consistent with wallerian degeneration. Patchy pontine and minimal supratentorial white matter hypodensities compatible with chronic small vessel ischemic disease, less than expected for age. No acute large vascular territory infarct. No abnormal extra-axial fluid collections. VASCULAR: Mild calcific atherosclerosis of the carotid siphons. SKULL: No skull fracture. No significant scalp soft tissue swelling. SINUSES/ORBITS: The mastoid air-cells and included paranasal sinuses are well-aerated.The included ocular globes and orbital contents are non-suspicious. OTHER: None. ASPECTS [facility] Stroke Program Early CT Score) - Ganglionic level infarction (caudate,  lentiform nuclei, internal capsule, insula, M1-M3 cortex): 7 - Supraganglionic infarction (M4-M6 cortex): 3 Total score (0-10 with 10 being normal): 10 IMPRESSION: 1. No acute intracranial process. 2. Old LEFT MCA territory infarct involving LEFT basal ganglia and LEFT temporal lobe. 3. ASPECTS is 10 . 4. Acute findings text paged to [place], Neurology via [place] system on 07/12/2017 at 6:30 pm including interpreting physician phone number.  Electronically Signed   By: Elon Alas M.D.   On: 07/12/2017 18:34    Procedures Procedures (including critical care time)  Medications Ordered in ED Medications - No data to display   Initial Impression / Assessment and Plan / ED Course  I have reviewed the triage vital signs and the nursing notes.  Pertinent labs & imaging results that were available during my care of the patient were reviewed by me and considered in my medical decision making (see chart for details).    Valerie Mcclure is a 76 y.o. female here with slurred speech, worsening R sided weakness. However, she has slurred speech and weakness at baseline. Since it got worse around 2pm and she is in the TPA window, code stroke activated. I called Dr. Rory Percy, who will see patient.   6:30 pm Patient seen by Dr. Rory Percy. Husband now said that symptoms going on since yesterday. Neuro recommend admission for stroke vs seizure workup. Hospitalist to admit. CT head showed no bleed. Labs at baseline.    Final Clinical Impressions(s) / ED Diagnoses   Final diagnoses:  None    ED Discharge Orders    None       Drenda Freeze, MD 07/12/17 Pauline Aus

## 2017-07-13 ENCOUNTER — Observation Stay (HOSPITAL_COMMUNITY): Payer: Medicare Other

## 2017-07-13 ENCOUNTER — Other Ambulatory Visit: Payer: Self-pay

## 2017-07-13 DIAGNOSIS — E039 Hypothyroidism, unspecified: Secondary | ICD-10-CM | POA: Diagnosis not present

## 2017-07-13 DIAGNOSIS — G459 Transient cerebral ischemic attack, unspecified: Secondary | ICD-10-CM

## 2017-07-13 DIAGNOSIS — I10 Essential (primary) hypertension: Secondary | ICD-10-CM | POA: Diagnosis not present

## 2017-07-13 DIAGNOSIS — D638 Anemia in other chronic diseases classified elsewhere: Secondary | ICD-10-CM | POA: Diagnosis not present

## 2017-07-13 DIAGNOSIS — I130 Hypertensive heart and chronic kidney disease with heart failure and stage 1 through stage 4 chronic kidney disease, or unspecified chronic kidney disease: Secondary | ICD-10-CM | POA: Diagnosis not present

## 2017-07-13 DIAGNOSIS — I481 Persistent atrial fibrillation: Secondary | ICD-10-CM

## 2017-07-13 DIAGNOSIS — R001 Bradycardia, unspecified: Secondary | ICD-10-CM | POA: Diagnosis not present

## 2017-07-13 DIAGNOSIS — I5032 Chronic diastolic (congestive) heart failure: Secondary | ICD-10-CM | POA: Diagnosis not present

## 2017-07-13 DIAGNOSIS — R4781 Slurred speech: Secondary | ICD-10-CM

## 2017-07-13 DIAGNOSIS — R4701 Aphasia: Secondary | ICD-10-CM | POA: Diagnosis not present

## 2017-07-13 DIAGNOSIS — I480 Paroxysmal atrial fibrillation: Secondary | ICD-10-CM | POA: Diagnosis not present

## 2017-07-13 LAB — COMPREHENSIVE METABOLIC PANEL
ALBUMIN: 3.1 g/dL — AB (ref 3.5–5.0)
ALT: 10 U/L — ABNORMAL LOW (ref 14–54)
ANION GAP: 7 (ref 5–15)
AST: 16 U/L (ref 15–41)
Alkaline Phosphatase: 76 U/L (ref 38–126)
BILIRUBIN TOTAL: 0.6 mg/dL (ref 0.3–1.2)
BUN: 12 mg/dL (ref 6–20)
CHLORIDE: 103 mmol/L (ref 101–111)
CO2: 27 mmol/L (ref 22–32)
Calcium: 8.9 mg/dL (ref 8.9–10.3)
Creatinine, Ser: 1.38 mg/dL — ABNORMAL HIGH (ref 0.44–1.00)
GFR calc Af Amer: 42 mL/min — ABNORMAL LOW (ref >60)
GFR calc non Af Amer: 36 mL/min — ABNORMAL LOW (ref >60)
GLUCOSE: 104 mg/dL — AB (ref 65–99)
POTASSIUM: 4.3 mmol/L (ref 3.5–5.1)
SODIUM: 137 mmol/L (ref 135–145)
TOTAL PROTEIN: 6.1 g/dL — AB (ref 6.5–8.1)

## 2017-07-13 LAB — MAGNESIUM: Magnesium: 2 mg/dL (ref 1.7–2.4)

## 2017-07-13 LAB — LIPID PANEL
CHOL/HDL RATIO: 3.3 ratio
CHOLESTEROL: 119 mg/dL (ref 0–200)
HDL: 36 mg/dL — ABNORMAL LOW (ref >40)
LDL Cholesterol: 63 mg/dL (ref 0–99)
Triglycerides: 100 mg/dL (ref <150)
VLDL: 20 mg/dL (ref 0–40)

## 2017-07-13 LAB — CBC
HEMATOCRIT: 34.5 % — AB (ref 36.0–46.0)
HEMOGLOBIN: 11.4 g/dL — AB (ref 12.0–15.0)
MCH: 29.7 pg (ref 26.0–34.0)
MCHC: 33 g/dL (ref 30.0–36.0)
MCV: 89.8 fL (ref 78.0–100.0)
Platelets: 252 10*3/uL (ref 150–400)
RBC: 3.84 MIL/uL — ABNORMAL LOW (ref 3.87–5.11)
RDW: 13.8 % (ref 11.5–15.5)
WBC: 6.9 10*3/uL (ref 4.0–10.5)

## 2017-07-13 LAB — HEMOGLOBIN A1C
Hgb A1c MFr Bld: 5.7 % — ABNORMAL HIGH (ref 4.8–5.6)
Mean Plasma Glucose: 116.89 mg/dL

## 2017-07-13 MED ORDER — ATORVASTATIN CALCIUM 10 MG PO TABS: 5.0000 mg | ORAL_TABLET | Freq: Every day | ORAL | Status: DC

## 2017-07-13 NOTE — Evaluation (Signed)
Physical Therapy Evaluation Patient Details Name: Valerie Mcclure MRN: [id number] DOB: [birth year] Today's Date: 07/13/2017   History of Present Illness  76 y.o. female who is a past medical history of an embolic stroke in the left MCA territory in June 2018 with residual aphasia and mild residual right hemiparesis, hyperlipidemia, atrial fibrillation on Eliquis, who was in her usual state of health until about 1-1/2-2 days ago when the family started noticing that she has at times been more confused and unable to get her words out much worse than her baseline.  After the last stroke, she was left with moderate expressive aphasia and inability to form complete sentences.MRI revealed No acute intracranial process on this mildly motion degraded examination. Old LEFT MCA territory infarct involving LEFT basal ganglia and L temporal lobe.   Clinical Impression  Patient evaluated by Physical Therapy with no further acute PT needs identified. All education has been completed and the patient has no further questions. Pt is limited in her mobility with L-sided weakness from prior CVA however per her family is at her current baseline of mobility. Pt is currently supervision for bed mobility and min guard for  See below for any follow-up Physical Therapy or equipment needs. PT is signing off. Thank you for this referral.     Follow Up Recommendations No PT follow up;Supervision/Assistance - 24 hour    Equipment Recommendations  None recommended by PT    Recommendations for Other Services       Precautions / Restrictions Precautions Precautions: Fall Restrictions Weight Bearing Restrictions: No      Mobility  Bed Mobility Overal bed mobility: Needs Assistance Bed Mobility: Supine to Sit     Supine to sit: Supervision     General bed mobility comments: supervision for safety  Transfers Overall transfer level: Needs assistance Equipment used: None Transfers: Sit to/from Stand Sit to  Stand: Min guard         General transfer comment: min guard for safety, required 2x attempts to come to standing, vc for scooting hips forward on bed after 1st attempt, able to steady herself with use of calves on bed  Ambulation/Gait Ambulation/Gait assistance: Min guard Ambulation Distance (Feet): 200 Feet Assistive device: None Gait Pattern/deviations: Step-through pattern;Decreased step length - left;Decreased dorsiflexion - left;Narrow base of support;Steppage Gait velocity: slowed Gait velocity interpretation: Below normal speed for age/gender General Gait Details: hands on min guard for safety, steppage gait on L to clear decreased dorsiflexion of L ankle, despite gait deviations pt with slow steady gait, with no LoB, pt family reports baseline gait pattern     Modified Rankin (Stroke Patients Only) Modified Rankin (Stroke Patients Only) Pre-Morbid Rankin Score: Moderately severe disability Modified Rankin: Moderately severe disability     Balance Overall balance assessment: Needs assistance Sitting-balance support: No upper extremity supported Sitting balance-Leahy Scale: Fair     Standing balance support: No upper extremity supported;During functional activity Standing balance-Leahy Scale: Fair Standing balance comment: able to steady herself in standing                             Pertinent Vitals/Pain Pain Assessment: No/denies pain    Home Living Family/patient expects to be discharged to:: Private residence Living Arrangements: Spouse/significant other Available Help at Discharge: Family;Available 24 hours/day Type of Home: Apartment Home Access: Level entry     Home Layout: One level Home Equipment: Walker - 2 wheels;Cane - single point;Grab bars -  tub/shower;Tub bench      Prior Function Level of Independence: Needs assistance   Gait / Transfers Assistance Needed: ambulates with RW when needed but normally without AD  ADL's /  Homemaking Assistance Needed: independent with ADLs, assist with iADLs,        Hand Dominance   Dominant Hand: Right    Extremity/Trunk Assessment   Upper Extremity Assessment Upper Extremity Assessment: Overall WFL for tasks assessed    Lower Extremity Assessment Lower Extremity Assessment: LLE deficits/detail LLE Deficits / Details: L LE ROM WFL, strength grossly assessed 4/5 LLE Sensation: (intact)       Communication   Communication: Expressive difficulties(at baseline from previous CVA)  Cognition Arousal/Alertness: Awake/alert Behavior During Therapy: WFL for tasks assessed/performed Overall Cognitive Status: History of cognitive impairments - at baseline                                 General Comments: expressive difficulties      General Comments General comments (skin integrity, edema, etc.): Pt daughter and husband present during evaluation and report pt is at baseline mobility        Assessment/Plan    PT Assessment Patent does not need any further PT services         PT Goals (Current goals can be found in the Care Plan section)  Acute Rehab PT Goals Patient Stated Goal: go home for Christmas PT Goal Formulation: With patient/family     AM-PAC PT "6 Clicks" Daily Activity  Outcome Measure Difficulty turning over in bed (including adjusting bedclothes, sheets and blankets)?: None Difficulty moving from lying on back to sitting on the side of the bed? : None Difficulty sitting down on and standing up from a chair with arms (e.g., wheelchair, bedside commode, etc,.)?: A Little Help needed moving to and from a bed to chair (including a wheelchair)?: A Little Help needed walking in hospital room?: A Little Help needed climbing 3-5 steps with a railing? : A Little 6 Click Score: 20    End of Session Equipment Utilized During Treatment: Gait belt Activity Tolerance: Patient tolerated treatment well Patient left: in chair;with call  bell/phone within reach;with family/visitor present Nurse Communication: Mobility status PT Visit Diagnosis: Muscle weakness (generalized) (M62.81);Other abnormalities of gait and mobility (R26.89);Difficulty in walking, not elsewhere classified (R26.2)    Time: 7989-2119 PT Time Calculation (min) (ACUTE ONLY): 24 min   Charges:   PT Evaluation $PT Eval Moderate Complexity: 1 Mod PT Treatments $Gait Training: 8-22 mins   PT G Codes:   PT G-Codes **NOT FOR INPATIENT CLASS** Functional Assessment Tool Used: AM-PAC 6 Clicks Basic Mobility Functional Limitation: Mobility: Walking and moving around Mobility: Walking and Moving Around Current Status (E1740): At least 20 percent but less than 40 percent impaired, limited or restricted Mobility: Walking and Moving Around Goal Status [phone]): At least 20 percent but less than 40 percent impaired, limited or restricted Mobility: Walking and Moving Around Discharge Status [phone]): At least 20 percent but less than 40 percent impaired, limited or restricted    Benjamine Mola B. Migdalia Dk PT, DPT Acute Rehabilitation  [phone] Pager [phone]    [place] 07/13/2017, 9:15 AM

## 2017-07-13 NOTE — Discharge Summary (Signed)
Physician Discharge Summary  Valerie Mcclure NOM:[id number] DOB: 18-Oct-1940 DOA: 07/12/2017  PCP: [name], MD  Admit date: 07/12/2017 Discharge date: 07/13/2017  Admitted From: Home Disposition: Left AMA  Recommendations for Outpatient Follow-up:  1. Follow up with PCP in 1 week 2. Please obtain BMP/CBC in one week 3. Please follow up on the following pending results: None  Home Health: None Equipment/Devices: None  Discharge Condition: Left AMA CODE STATUS: Full code Diet recommendation: Heart healthy   Brief/Interim Summary:  Admission HPI written by Rise Patience, MD   Chief Complaint: Right facial droop and garbled speech.  HPI: Valerie Mcclure is a 76 y.o. female with previous history of embolic CVA in July 9628, atrial fibrillation, hypertension, hypothyroidism was brought to the ER after patient was found to be increasingly confused with garbled speech and difficulty walking and right facial droop.  Patient as per the family has been having over the last 2 days confusion and dropping things.  This afternoon around 2 PM family noticed right facial droop and garbled speech and patient was brought to the ER.  ED Course: In the ER CT head was unremarkable and neurology on call was consulted and since patient symptoms have been more than 24 hours was not felt to be a candidate for TPA.  Patient admitted for further management of possible TIA versus seizures or stroke.    Hospital course:  Facial droop Concern for TIA vs stroke. MRI negative for stroke. Neurology felt patient was at baseline.  Bradycardia Patient with bradycardia to mid-high 30s with atrial flutter. Patient on metoprolol. Cardiology consulted secondary to bradycardia in setting of questionable facial droop and slurred speech. Cardiology recommending no change in her medication regimen.  Discharge Diagnoses:  Principal Problem:   TIA (transient ischemic attack) Active Problems:    Hypothyroidism   CKD (chronic kidney disease)   Anemia of chronic disease   Benign essential HTN   Global aphasia   Chronic diastolic heart failure ([name])   PAF (paroxysmal atrial fibrillation) [facility])    Discharge Instructions  Discharge Instructions    Ambulatory referral to Neurology   Complete by:  As directed    An appointment is requested in approximately: 6 weeks Follow up with stroke clinic (Dr [name] preferred, if not available, then consider Caesar Chestnut, [facility] or Jaynee Eagles whoever is available) at [facility] in about 6-8 weeks. Thanks.     Allergies as of 07/13/2017      Reactions   Shrimp [shellfish Allergy] Anaphylaxis, Rash   Break outs and swelling of the throat   Valsartan Other (See Comments)   Renal failure   Tandem Plus [fefum-fepo-fa-b Cmp-c-zn-mn-cu] Nausea And Vomiting   Ivp Dye [iodinated Diagnostic Agents] Rash   itching   Penicillins Itching, Rash   Has patient had a PCN reaction causing immediate rash, facial/tongue/throat swelling, SOB or lightheadedness with hypotension: Yes Has patient had a PCN reaction causing severe rash involving mucus membranes or skin necrosis: No Has patient had a PCN reaction that required hospitalization: No Has patient had a PCN reaction occurring within the last 10 years: No If all of the above answers are "NO", then may proceed with Cephalosporin use.       Follow-up Information    Garvin Fila, MD. Schedule an appointment as soon as possible for a visit in 6 week(s).   Specialties:  Neurology, Radiology Contact information: [address] [place] [address] [phone]  Allergies  Allergen Reactions  . Shrimp [Shellfish Allergy] Anaphylaxis and Rash    Break outs and swelling of the throat  . Valsartan Other (See Comments)    Renal failure  . Tandem Plus [Fefum-Fepo-Fa-B Cmp-C-Zn-Mn-Cu] Nausea And Vomiting  . Ivp Dye [Iodinated Diagnostic Agents] Rash    itching  . Penicillins Itching  and Rash    Has patient had a PCN reaction causing immediate rash, facial/tongue/throat swelling, SOB or lightheadedness with hypotension: Yes Has patient had a PCN reaction causing severe rash involving mucus membranes or skin necrosis: No Has patient had a PCN reaction that required hospitalization: No Has patient had a PCN reaction occurring within the last 10 years: No If all of the above answers are "NO", then may proceed with Cephalosporin use.     Consultations:  Neurology  Cardiology   Procedures/Studies: Mr Angiogram Head Wo Contrast  Result Date: 07/12/2017 CLINICAL DATA:  Worsening weakness and slurred speech. Recent stroke with RIGHT-sided weakness and slurred speech, status post endovascular revascularization of LEFT MCA occlusion. History of hypertension, hyperlipidemia. EXAM: MRI HEAD WITHOUT CONTRAST MRA HEAD WITHOUT CONTRAST TECHNIQUE: Multiplanar, multiecho pulse sequences of the brain and surrounding structures were obtained without intravenous contrast. Angiographic images of the head were obtained using MRA technique without contrast. COMPARISON:  MRI of the head January 12, 2017 and CT HEAD July 12, 2017 at 1823 hours. FINDINGS: MRI HEAD FINDINGS- mildly motion degraded examination. BRAIN: No reduced diffusion to suggest acute ischemia. Old LEFT basal ganglia infarct with marginal susceptibility artifact. LEFT temporal lobe encephalomalacia. Mild ex vacuo dilatation LEFT lateral ventricle, ventricles and sulci are overall normal for patient's age. Faint FLAIR T2 hyperintense signal within the LEFT cerebral peduncle and bilateral pons compatible with wallerian degeneration and, component chronic small vessel ischemic disease. No mass effect or masses. No abnormal extra-axial fluid collections. VASCULAR: Normal major intracranial vascular flow voids present at skull base. SKULL AND UPPER CERVICAL SPINE: No abnormal sellar expansion. No suspicious calvarial bone marrow signal.  Craniocervical junction maintained. SINUSES/ORBITS: The mastoid air-cells and included paranasal sinuses are well-aerated. The included ocular globes and orbital contents are non-suspicious. OTHER: None. MRA HEAD FINDINGS- moderately motion degraded examination resulted in duplicated appearance of intracranial vessels ANTERIOR CIRCULATION: Patent bilateral internal carotid artery's. Patent anterior and middle cerebral artery's, limited assessment of distal segments. Moderate stenosis proximal bilateral M2 segments. POSTERIOR CIRCULATION: RIGHT vertebral artery is dominant. Patent vertebral artery's and basilar artery. Robust LEFT posterior communicating artery versus fetal origin of PCA. Patent bilateral posterior cerebral artery's. ANATOMIC VARIANTS: None. Source images and MIP images were reviewed. IMPRESSION: MRI HEAD: 1. No acute intracranial process on this mildly motion degraded examination. 2. Old LEFT MCA territory infarct involving LEFT basal ganglia and LEFT temporal lobe. MRA HEAD: 1. Moderately motion degraded examination. 2. No emergent large vessel occlusion or flow limiting stenosis. 3. Moderate bilateral M2 stenosis versus motion artifact. Electronically Signed   By: Elon Alas M.D.   On: 07/12/2017 22:11   Mr Brain Wo Contrast  Result Date: 07/12/2017 CLINICAL DATA:  Worsening weakness and slurred speech. Recent stroke with RIGHT-sided weakness and slurred speech, status post endovascular revascularization of LEFT MCA occlusion. History of hypertension, hyperlipidemia. EXAM: MRI HEAD WITHOUT CONTRAST MRA HEAD WITHOUT CONTRAST TECHNIQUE: Multiplanar, multiecho pulse sequences of the brain and surrounding structures were obtained without intravenous contrast. Angiographic images of the head were obtained using MRA technique without contrast. COMPARISON:  MRI of the head January 12, 2017 and CT HEAD July 12, 2017 at 1823 hours. FINDINGS: MRI HEAD FINDINGS- mildly motion degraded  examination. BRAIN: No reduced diffusion to suggest acute ischemia. Old LEFT basal ganglia infarct with marginal susceptibility artifact. LEFT temporal lobe encephalomalacia. Mild ex vacuo dilatation LEFT lateral ventricle, ventricles and sulci are overall normal for patient's age. Faint FLAIR T2 hyperintense signal within the LEFT cerebral peduncle and bilateral pons compatible with wallerian degeneration and, component chronic small vessel ischemic disease. No mass effect or masses. No abnormal extra-axial fluid collections. VASCULAR: Normal major intracranial vascular flow voids present at skull base. SKULL AND UPPER CERVICAL SPINE: No abnormal sellar expansion. No suspicious calvarial bone marrow signal. Craniocervical junction maintained. SINUSES/ORBITS: The mastoid air-cells and included paranasal sinuses are well-aerated. The included ocular globes and orbital contents are non-suspicious. OTHER: None. MRA HEAD FINDINGS- moderately motion degraded examination resulted in duplicated appearance of intracranial vessels ANTERIOR CIRCULATION: Patent bilateral internal carotid artery's. Patent anterior and middle cerebral artery's, limited assessment of distal segments. Moderate stenosis proximal bilateral M2 segments. POSTERIOR CIRCULATION: RIGHT vertebral artery is dominant. Patent vertebral artery's and basilar artery. Robust LEFT posterior communicating artery versus fetal origin of PCA. Patent bilateral posterior cerebral artery's. ANATOMIC VARIANTS: None. Source images and MIP images were reviewed. IMPRESSION: MRI HEAD: 1. No acute intracranial process on this mildly motion degraded examination. 2. Old LEFT MCA territory infarct involving LEFT basal ganglia and LEFT temporal lobe. MRA HEAD: 1. Moderately motion degraded examination. 2. No emergent large vessel occlusion or flow limiting stenosis. 3. Moderate bilateral M2 stenosis versus motion artifact. Electronically Signed   By: Elon Alas M.D.   On:  07/12/2017 22:11   Dg Hip Unilat With Pelvis Min 4 Views Right  Result Date: 06/14/2017 CLINICAL DATA:  Fall earlier this year in June or July, patient unsure, was getting better, now having pain in right groin and difficulty walking. EXAM: DG HIP (WITH OR WITHOUT PELVIS) 4+V RIGHT COMPARISON:  None FINDINGS: Posterior lumbar fusion. Hips are located. No evidence of pelvic fracture or sacral fracture. Dedicated view of the RIGHT hip demonstrates no femoral neck fracture. IMPRESSION: No pelvic fracture or RIGHT hip fracture. Electronically Signed   By: Suzy Bouchard M.D.   On: 06/14/2017 10:17   Ct Head Code Stroke Wo Contrast  Result Date: 07/12/2017 CLINICAL DATA:  Code stroke. RIGHT-sided facial droop, RIGHT-sided weakness and aphasia. History of stroke, hypertension and hyperlipidemia. EXAM: CT HEAD WITHOUT CONTRAST TECHNIQUE: Contiguous axial images were obtained from the base of the skull through the vertex without intravenous contrast. COMPARISON:  MRI of the head January 12, 2017 FINDINGS: BRAIN: No intraparenchymal hemorrhage, mass effect nor midline shift. Old LEFT basal ganglia infarct with ex vacuo dilatation LEFT lateral ventricle. LEFT temporal lobe encephalomalacia. No hydrocephalus. LEFT cerebral peduncle volume loss consistent with wallerian degeneration. Patchy pontine and minimal supratentorial white matter hypodensities compatible with chronic small vessel ischemic disease, less than expected for age. No acute large vascular territory infarct. No abnormal extra-axial fluid collections. VASCULAR: Mild calcific atherosclerosis of the carotid siphons. SKULL: No skull fracture. No significant scalp soft tissue swelling. SINUSES/ORBITS: The mastoid air-cells and included paranasal sinuses are well-aerated.The included ocular globes and orbital contents are non-suspicious. OTHER: None. ASPECTS [facility] Stroke Program Early CT Score) - Ganglionic level infarction (caudate, lentiform nuclei,  internal capsule, insula, M1-M3 cortex): 7 - Supraganglionic infarction (M4-M6 cortex): 3 Total score (0-10 with 10 being normal): 10 IMPRESSION: 1. No acute intracranial process. 2. Old LEFT MCA territory infarct involving LEFT basal ganglia and LEFT temporal  lobe. 3. ASPECTS is 10 . 4. Acute findings text paged to [place], Neurology via [place] system on 07/12/2017 at 6:30 pm including interpreting physician phone number. Electronically Signed   By: Elon Alas M.D.   On: 07/12/2017 18:34      Subjective: Symptoms resolved. No palpitations or dyspnea.  Discharge Exam: Vitals:   07/13/17 1008 07/13/17 1332  BP:  (!) 128/49  Pulse:  62  Resp:  16  Temp: 97.9 F (36.6 C) 97.8 F (36.6 C)  SpO2: 100% 98%   Vitals:   07/13/17 0500 07/13/17 0936 07/13/17 1008 07/13/17 1332  BP: (!) 143/49 (!) 137/59  (!) 128/49  Pulse: 66 64  62  Resp: 18 18  16   Temp: 97.8 F (36.6 C)  97.9 F (36.6 C) 97.8 F (36.6 C)  TempSrc: Oral  Oral Oral  SpO2: 100%  100% 98%  Weight:      Height:        General: Pt is alert, awake, not in acute distress Cardiovascular: RRR, S1/S2 +, no rubs, no gallops Respiratory: CTA bilaterally, no wheezing, no rhonchi Abdominal: Soft, NT, ND, bowel sounds + Extremities: no edema, no cyanosis    The results of significant diagnostics from this hospitalization (including imaging, microbiology, ancillary and laboratory) are listed below for reference.     Microbiology: No results found for this or any previous visit (from the past 240 hour(s)).   Labs: BNP (last 3 results) No results for input(s): BNP in the last 8760 hours. Basic Metabolic Panel: Recent Labs  Lab 07/12/17 1755 07/12/17 1813 07/13/17 0531 07/13/17 1000  NA 137 139 137  --   K 4.1 4.1 4.3  --   CL 103 101 103  --   CO2 26  --  27  --   GLUCOSE 129* 127* 104*  --   BUN 14 16 12   --   CREATININE 1.44* 1.40* 1.38*  --   CALCIUM 9.0  --  8.9  --   MG  --   --   --  2.0    Liver Function Tests: Recent Labs  Lab 07/12/17 1755 07/13/17 0531  AST 21 16  ALT 11* 10*  ALKPHOS 89 76  BILITOT 0.7 0.6  PROT 7.1 6.1*  ALBUMIN 3.6 3.1*   No results for input(s): LIPASE, AMYLASE in the last 168 hours. No results for input(s): AMMONIA in the last 168 hours. CBC: Recent Labs  Lab 07/12/17 1755 07/12/17 1813 07/13/17 0531  WBC 7.6  --  6.9  NEUTROABS 5.0  --   --   HGB 12.2 12.6 11.4*  HCT 37.2 37.0 34.5*  MCV 89.2  --  89.8  PLT 336  --  252   Cardiac Enzymes: No results for input(s): CKTOTAL, CKMB, CKMBINDEX, TROPONINI in the last 168 hours. BNP: Invalid input(s): POCBNP CBG: No results for input(s): GLUCAP in the last 168 hours. D-Dimer No results for input(s): DDIMER in the last 72 hours. Hgb A1c Recent Labs    07/13/17 0531  HGBA1C 5.7*   Lipid Profile Recent Labs    07/13/17 0531  CHOL 119  HDL 36*  LDLCALC 63  TRIG 100  CHOLHDL 3.3   Thyroid function studies No results for input(s): TSH, T4TOTAL, T3FREE, THYROIDAB in the last 72 hours.  Invalid input(s): FREET3 Anemia work up No results for input(s): VITAMINB12, FOLATE, FERRITIN, TIBC, IRON, RETICCTPCT in the last 72 hours. Urinalysis    Component Value Date/Time   COLORURINE YELLOW  01/12/2017 [place] 01/12/2017 0053   LABSPEC 1.010 01/12/2017 0053   PHURINE 5.0 01/12/2017 0053   GLUCOSEU NEGATIVE 01/12/2017 0053   GLUCOSEU NEGATIVE 11/18/2010 0859   HGBUR NEGATIVE 01/12/2017 0053   HGBUR negative 12/13/2008 [place] 01/12/2017 [place] 01/12/2017 0053   PROTEINUR NEGATIVE 01/12/2017 0053   UROBILINOGEN 0.2 11/18/2010 0859   NITRITE NEGATIVE 01/12/2017 0053   LEUKOCYTESUR NEGATIVE 01/12/2017 0053     SIGNED:   Cordelia Poche, MD [facility] 07/13/2017, 4:34 PM Pager [phone]  If 7PM-7AM, please contact night-coverage [email or web address] Password [email or web address]

## 2017-07-13 NOTE — Progress Notes (Signed)
Pt determined to discharge, MD made aware.  Family and patient decided to leave AMA.  IV removed, family packed belongings and wheeled patient out.  Cori Razor, RN

## 2017-07-13 NOTE — Consult Note (Signed)
Cardiology Consultation:   Patient ID: Valerie Mcclure; [id number]; 02-Apr-1941   Admit date: 07/12/2017 Date of Consult: 07/13/2017  Primary Care Provider: [name], MD Primary Cardiologist: Dr. Acie Fredrickson Primary Electrophysiologist:  N/A   Patient Profile:   Valerie Mcclure is a 76 y.o. female with a hx of TN, chronic lower extremity edema, hypothyroidism, HLD, prediabetes, CKD stage III, recent CVA and persistent atrial fibrillation who is being seen today for the evaluation of bradycardia at the request of Dr. Lonny Prude.  History of Present Illness:   Valerie Mcclure is a pleasant 76 year old Caucasian female with past medical history of HTN, chronic lower extremity edema, hypothyroidism, HLD, prediabetes, CKD stage III, recent CVA and persistent atrial flutter.  Patient was admitted in May 2018 with acute CVA and underwent thrombectomy to the left MCA with subsequent brain hemorrhage with shift.  Cardiology was consulted for possible atrial fibrillation, however on further review, it was felt telemetry showed frequent PACs instead.  Echocardiogram obtained on 12/02/2016 showed EF 55-60%, mild MR, PA peak pressure 47 mmHg, grade 2 DD.  Initial plan is to discharge patient with 30-day monitor instead of loop recorder.  Unfortunately, patient was readmitted for opiate overdose, EKG obtained on 12/19/2016 showed new atrial flutter.  After discussing with Dr. Erlinda Hong of stroke service, patient was started on Eliquis 5 mg twice daily on 12/23/2016 prior to her going to rehab.  She was seen again near late June for worsening right-sided weakness and new onset aphasia.  Neurology recommended continue Eliquis for the time being.  Since discharge, she has been fairly independent.  She continued to have some right-sided weakness, however the symptom has significantly improved.  Her daughter and husband manages her medication and she has been compliant with his metoprolol and Eliquis.  According to the  patient, she presented to the [facility], Valerie Mcclure this time due to onset of facial drooping.  This symptom quickly resolved.  Although ED physician noted worsening right-sided weakness and worsening slurred speech, however both the patient and the family denies any weakness or slurred speech.  She denies any recent worsening lower extremity edema, orthopnea, PND and chest pain.  Overnight, while on telemetry, she was noted to have bradycardia down to the 38.  Talking with the patient, however she denies any recent dizziness, blurred vision or feeling of passing out.  She denies any fatigue or shortness of breath either.   Past Medical History:  Diagnosis Date  . Blood transfusion without reported diagnosis   . Chronic low back pain   . Hyperlipidemia   . Osteopenia   . Stroke ([place])   . Unspecified essential hypertension   . Unspecified hypothyroidism     Past Surgical History:  Procedure Laterality Date  . ABDOMINAL HYSTERECTOMY  1970  . CHOLECYSTECTOMY  07/2009   Dr. Rise Patience  . COLONOSCOPY  2003  . FLEXIBLE SIGMOIDOSCOPY  2010  . HAND SURGERY    . IR ANGIO INTRA EXTRACRAN SEL COM CAROTID INNOMINATE UNI L MOD SED  11/29/2016  . IR ANGIO VERTEBRAL SEL SUBCLAVIAN INNOMINATE UNI R MOD SED  11/29/2016  . IR PERCUTANEOUS ART THROMBECTOMY/INFUSION INTRACRANIAL INC DIAG ANGIO  11/29/2016  . IR RADIOLOGIST EVAL & MGMT  03/24/2017  . LUMBAR LAMINECTOMY  11/2008   Done by Dr. Patrice Paradise  . RADIOLOGY WITH ANESTHESIA N/A 11/29/2016   Procedure: RADIOLOGY WITH ANESTHESIA;  Surgeon: Radiologist, Medication, MD;  Location: [facility];  Service: Radiology;  Laterality: N/A;  . THYROIDECTOMY  Home Medications:  Prior to Admission medications   Medication Sig Start Date End Date Taking? Authorizing Provider  amLODipine (NORVASC) 10 MG tablet Take 0.5 tablets (5 mg total) by mouth daily. 04/09/17  Yes Burns, Claudina Lick, MD  apixaban (ELIQUIS) 5 MG TABS tablet Take 1 tablet (5 mg total) by mouth 2 (two) times daily. 02/15/17  Yes  Burns, Claudina Lick, MD  atorvastatin (LIPITOR) 10 MG tablet Take 0.5 tablets (5 mg total) by mouth daily at 6 PM. 03/03/17 03/03/18 Yes Burns, Claudina Lick, MD  Calcium Carbonate-Vit D-Min (CALCIUM 1200 PO) Take 1 tablet by mouth daily.    Yes [provider]  Cholecalciferol (VITAMIN D3) 1000 UNITS CAPS Take 1,000 Units by mouth daily.    Yes [provider]  FLUoxetine (PROZAC) 20 MG capsule Take 20 mg by mouth daily.   Yes [provider]  hydrALAZINE (APRESOLINE) 25 MG tablet TAKE 1 TABLET(25 MG) BY MOUTH THREE TIMES DAILY 07/03/17  Yes Burns, Claudina Lick, MD  levothyroxine (SYNTHROID, LEVOTHROID) 88 MCG tablet Take 1 tablet (88 mcg total) by mouth daily. 07/12/17  Yes Burns, Claudina Lick, MD  Maltodextrin-Xanthan Gum ([place]) POWD Use as needed to get liquid to nectar thick consistency 01/19/17  Yes Love, Ivan Anchors, PA-C  metoprolol tartrate (LOPRESSOR) 25 MG tablet Take 0.5 tablets (12.5 mg total) by mouth 2 (two) times daily. 04/19/17  Yes Burns, Claudina Lick, MD  morphine (MS CONTIN) 15 MG 12 hr tablet Take 15 mg by mouth every 12 (twelve) hours. 06/16/17  Yes [provider]  nortriptyline (PAMELOR) 10 MG capsule TAKE 2 CAPSULES BY MOUTH AT BEDTIME 02/02/17  Yes Burns, Claudina Lick, MD  polycarbophil (FIBERCON) 625 MG tablet Take 2 tablets (1,250 mg total) by mouth daily. 01/20/17  Yes Love, Ivan Anchors, PA-C  potassium chloride SA (K-DUR,KLOR-CON) 20 MEQ tablet Take 1 tablet (20 mEq total) by mouth once. 03/03/17 07/12/17 Yes Burns, Claudina Lick, MD  vitamin B-12 (CYANOCOBALAMIN) 100 MCG tablet Take 100 mcg by mouth daily.   Yes [provider]    Inpatient Medications: Scheduled Meds: . amLODipine  5 mg Oral Daily  . apixaban  5 mg Oral BID  . atorvastatin  5 mg Oral q1800  . FLUoxetine  20 mg Oral Daily  . hydrALAZINE  25 mg Oral Q8H  . levothyroxine  88 mcg Oral QAC breakfast  . metoprolol tartrate  12.5 mg Oral BID  . morphine  15 mg Oral Q12H  . nortriptyline   20 mg Oral QHS  . polycarbophil  1,250 mg Oral Daily  . vitamin B-12  100 mcg Oral Daily   Continuous Infusions:  PRN Meds: acetaminophen **OR** acetaminophen (TYLENOL) oral liquid 160 mg/5 mL **OR** acetaminophen, RESOURCE THICKENUP CLEAR  Allergies:    Allergies  Allergen Reactions  . Shrimp [Shellfish Allergy] Anaphylaxis and Rash    Break outs and swelling of the throat  . Valsartan Other (See Comments)    Renal failure  . Tandem Plus [Fefum-Fepo-Fa-B Cmp-C-Zn-Mn-Cu] Nausea And Vomiting  . Ivp Dye [Iodinated Diagnostic Agents] Rash    itching  . Penicillins Itching and Rash    Has patient had a PCN reaction causing immediate rash, facial/tongue/throat swelling, SOB or lightheadedness with hypotension: Yes Has patient had a PCN reaction causing severe rash involving mucus membranes or skin necrosis: No Has patient had a PCN reaction that required hospitalization: No Has patient had a PCN reaction occurring within the last 10 years: No If all of  the above answers are "NO", then may proceed with Cephalosporin use.     Social History:   Social History   Socioeconomic History  . Marital status: Married    Spouse name: Not on file  . Number of children: Not on file  . Years of education: Not on file  . Highest education level: Not on file  Social Needs  . Financial resource strain: Not on file  . Food insecurity - worry: Not on file  . Food insecurity - inability: Not on file  . Transportation needs - medical: Not on file  . Transportation needs - non-medical: Not on file  Occupational History  . Not on file  Tobacco Use  . Smoking status: Never Smoker  . Smokeless tobacco: Never Used  Substance and Sexual Activity  . Alcohol use: No  . Drug use: No  . Sexual activity: Not Currently  Other Topics Concern  . Not on file  Social History Narrative   Married    Family History:    Family History  Problem Relation Age of Onset  . Heart disease Father 36       MI  age 36s  . Lung cancer Brother 73  . Colon cancer Neg Hx   . Esophageal cancer Neg Hx   . Rectal cancer Neg Hx   . Stomach cancer Neg Hx      ROS:  Please see the history of present illness.  Review of Systems  Respiratory: Positive for sputum production.     All other ROS reviewed and negative.     Physical Exam/Data:   Vitals:   07/13/17 0300 07/13/17 0500 07/13/17 0936 07/13/17 1008  BP: (!) 149/73 (!) 143/49 (!) 137/59   Pulse: 61 66 64   Resp: 18 18 18    Temp: 97.9 F (36.6 C) 97.8 F (36.6 C)  97.9 F (36.6 C)  TempSrc: Oral Oral  Oral  SpO2: 98% 100%  100%  Weight:      Height:       No intake or output data in the 24 hours ending 07/13/17 1231 Filed Weights   07/12/17 1749 07/12/17 2100  Weight: 154 lb (69.9 kg) 153 lb 15.5 oz (69.8 kg)   Body mass index is 24.85 kg/m.  General:  Well nourished, well developed, in no acute distress HEENT: normal Lymph: no adenopathy Neck: no JVD Endocrine:  No thryomegaly Vascular: No carotid bruits; FA pulses 2+ bilaterally without bruits  Cardiac:  normal S1, S2; irregularly irregular; no murmur  Lungs:  clear to auscultation bilaterally, no wheezing, rhonchi or rales  Abd: soft, nontender, no hepatomegaly  Ext: no edema Musculoskeletal:  No deformities, BUE and BLE strength normal and equal Skin: warm and dry  Neuro:  CNs 2-12 intact, no focal abnormalities noted Psych:  Normal affect   EKG:  The EKG was personally reviewed and demonstrates:  Atrial flutter rate controlled Telemetry:  Telemetry was personally reviewed and demonstrates: Persistent atrial flutter with HR 50s  Relevant CV Studies: Echo 12/02/2016 LV EF: 55% -   60%  ------------------------------------------------------------------- History:   PMH:  Stroke.  Coronary artery disease.  Risk factors: Hypertension. Dyslipidemia.  ------------------------------------------------------------------- Study Conclusions  - Left ventricle: The cavity  size was normal. Wall thickness was   normal. Systolic function was normal. The estimated ejection   fraction was in the range of 55% to 60%. Although no diagnostic   regional wall motion abnormality was identified, this possibility   cannot be completely excluded  on the basis of this study.   Features are consistent with a pseudonormal left ventricular   filling pattern, with concomitant abnormal relaxation and   increased filling pressure (grade 2 diastolic dysfunction). - Aortic valve: There was no stenosis. There was trivial   regurgitation. - Mitral valve: There was mild regurgitation. - Left atrium: The atrium was mildly dilated. - Right ventricle: The cavity size was normal. Systolic function   was normal. - Tricuspid valve: Peak RV-RA gradient (S): 39 mm Hg. - Pulmonary arteries: PA peak pressure: 47 mm Hg (S). - Systemic veins: IVC measured 2.0 cm with < 50% respirophasic   variation, suggesting RA pressure 8 mmHg.  Impressions:  - Normal LV size with EF 55-60%. Moderate diastolic dysfunction.   Normal RV size and systolic function. Mild pulmonary   hypertension. Mild mitral regurgitation.  Laboratory Data:  Chemistry Recent Labs  Lab 07/12/17 1755 07/12/17 1813 07/13/17 0531  NA 137 139 137  K 4.1 4.1 4.3  CL 103 101 103  CO2 26  --  27  GLUCOSE 129* 127* 104*  BUN 14 16 12   CREATININE 1.44* 1.40* 1.38*  CALCIUM 9.0  --  8.9  GFRNONAA 34*  --  36*  GFRAA 39*  --  42*  ANIONGAP 8  --  7    Recent Labs  Lab 07/12/17 1755 07/13/17 0531  PROT 7.1 6.1*  ALBUMIN 3.6 3.1*  AST 21 16  ALT 11* 10*  ALKPHOS 89 76  BILITOT 0.7 0.6   Hematology Recent Labs  Lab 07/12/17 1755 07/12/17 1813 07/13/17 0531  WBC 7.6  --  6.9  RBC 4.17  --  3.84*  HGB 12.2 12.6 11.4*  HCT 37.2 37.0 34.5*  MCV 89.2  --  89.8  MCH 29.3  --  29.7  MCHC 32.8  --  33.0  RDW 13.4  --  13.8  PLT 336  --  252   Cardiac EnzymesNo results for input(s): TROPONINI in the last 168  hours.  Recent Labs  Lab 07/12/17 1811  TROPIPOC 0.01    BNPNo results for input(s): BNP, PROBNP in the last 168 hours.  DDimer No results for input(s): DDIMER in the last 168 hours.  Radiology/Studies:  Mr Angiogram Head Wo Contrast  Result Date: 07/12/2017 CLINICAL DATA:  Worsening weakness and slurred speech. Recent stroke with RIGHT-sided weakness and slurred speech, status post endovascular revascularization of LEFT MCA occlusion. History of hypertension, hyperlipidemia. EXAM: MRI HEAD WITHOUT CONTRAST MRA HEAD WITHOUT CONTRAST TECHNIQUE: Multiplanar, multiecho pulse sequences of the brain and surrounding structures were obtained without intravenous contrast. Angiographic images of the head were obtained using MRA technique without contrast. COMPARISON:  MRI of the head January 12, 2017 and CT HEAD July 12, 2017 at 1823 hours. FINDINGS: MRI HEAD FINDINGS- mildly motion degraded examination. BRAIN: No reduced diffusion to suggest acute ischemia. Old LEFT basal ganglia infarct with marginal susceptibility artifact. LEFT temporal lobe encephalomalacia. Mild ex vacuo dilatation LEFT lateral ventricle, ventricles and sulci are overall normal for patient's age. Faint FLAIR T2 hyperintense signal within the LEFT cerebral peduncle and bilateral pons compatible with wallerian degeneration and, component chronic small vessel ischemic disease. No mass effect or masses. No abnormal extra-axial fluid collections. VASCULAR: Normal major intracranial vascular flow voids present at skull base. SKULL AND UPPER CERVICAL SPINE: No abnormal sellar expansion. No suspicious calvarial bone marrow signal. Craniocervical junction maintained. SINUSES/ORBITS: The mastoid air-cells and included paranasal sinuses are well-aerated. The included ocular globes and orbital  contents are non-suspicious. OTHER: None. MRA HEAD FINDINGS- moderately motion degraded examination resulted in duplicated appearance of intracranial vessels  ANTERIOR CIRCULATION: Patent bilateral internal carotid artery's. Patent anterior and middle cerebral artery's, limited assessment of distal segments. Moderate stenosis proximal bilateral M2 segments. POSTERIOR CIRCULATION: RIGHT vertebral artery is dominant. Patent vertebral artery's and basilar artery. Robust LEFT posterior communicating artery versus fetal origin of PCA. Patent bilateral posterior cerebral artery's. ANATOMIC VARIANTS: None. Source images and MIP images were reviewed. IMPRESSION: MRI HEAD: 1. No acute intracranial process on this mildly motion degraded examination. 2. Old LEFT MCA territory infarct involving LEFT basal ganglia and LEFT temporal lobe. MRA HEAD: 1. Moderately motion degraded examination. 2. No emergent large vessel occlusion or flow limiting stenosis. 3. Moderate bilateral M2 stenosis versus motion artifact. Electronically Signed   By: Elon Alas M.D.   On: 07/12/2017 22:11   Mr Brain Wo Contrast  Result Date: 07/12/2017 CLINICAL DATA:  Worsening weakness and slurred speech. Recent stroke with RIGHT-sided weakness and slurred speech, status post endovascular revascularization of LEFT MCA occlusion. History of hypertension, hyperlipidemia. EXAM: MRI HEAD WITHOUT CONTRAST MRA HEAD WITHOUT CONTRAST TECHNIQUE: Multiplanar, multiecho pulse sequences of the brain and surrounding structures were obtained without intravenous contrast. Angiographic images of the head were obtained using MRA technique without contrast. COMPARISON:  MRI of the head January 12, 2017 and CT HEAD July 12, 2017 at 1823 hours. FINDINGS: MRI HEAD FINDINGS- mildly motion degraded examination. BRAIN: No reduced diffusion to suggest acute ischemia. Old LEFT basal ganglia infarct with marginal susceptibility artifact. LEFT temporal lobe encephalomalacia. Mild ex vacuo dilatation LEFT lateral ventricle, ventricles and sulci are overall normal for patient's age. Faint FLAIR T2 hyperintense signal within the  LEFT cerebral peduncle and bilateral pons compatible with wallerian degeneration and, component chronic small vessel ischemic disease. No mass effect or masses. No abnormal extra-axial fluid collections. VASCULAR: Normal major intracranial vascular flow voids present at skull base. SKULL AND UPPER CERVICAL SPINE: No abnormal sellar expansion. No suspicious calvarial bone marrow signal. Craniocervical junction maintained. SINUSES/ORBITS: The mastoid air-cells and included paranasal sinuses are well-aerated. The included ocular globes and orbital contents are non-suspicious. OTHER: None. MRA HEAD FINDINGS- moderately motion degraded examination resulted in duplicated appearance of intracranial vessels ANTERIOR CIRCULATION: Patent bilateral internal carotid artery's. Patent anterior and middle cerebral artery's, limited assessment of distal segments. Moderate stenosis proximal bilateral M2 segments. POSTERIOR CIRCULATION: RIGHT vertebral artery is dominant. Patent vertebral artery's and basilar artery. Robust LEFT posterior communicating artery versus fetal origin of PCA. Patent bilateral posterior cerebral artery's. ANATOMIC VARIANTS: None. Source images and MIP images were reviewed. IMPRESSION: MRI HEAD: 1. No acute intracranial process on this mildly motion degraded examination. 2. Old LEFT MCA territory infarct involving LEFT basal ganglia and LEFT temporal lobe. MRA HEAD: 1. Moderately motion degraded examination. 2. No emergent large vessel occlusion or flow limiting stenosis. 3. Moderate bilateral M2 stenosis versus motion artifact. Electronically Signed   By: Elon Alas M.D.   On: 07/12/2017 22:11   Ct Head Code Stroke Wo Contrast  Result Date: 07/12/2017 CLINICAL DATA:  Code stroke. RIGHT-sided facial droop, RIGHT-sided weakness and aphasia. History of stroke, hypertension and hyperlipidemia. EXAM: CT HEAD WITHOUT CONTRAST TECHNIQUE: Contiguous axial images were obtained from the base of the skull  through the vertex without intravenous contrast. COMPARISON:  MRI of the head January 12, 2017 FINDINGS: BRAIN: No intraparenchymal hemorrhage, mass effect nor midline shift. Old LEFT basal ganglia infarct with ex vacuo dilatation LEFT lateral ventricle.  LEFT temporal lobe encephalomalacia. No hydrocephalus. LEFT cerebral peduncle volume loss consistent with wallerian degeneration. Patchy pontine and minimal supratentorial white matter hypodensities compatible with chronic small vessel ischemic disease, less than expected for age. No acute large vascular territory infarct. No abnormal extra-axial fluid collections. VASCULAR: Mild calcific atherosclerosis of the carotid siphons. SKULL: No skull fracture. No significant scalp soft tissue swelling. SINUSES/ORBITS: The mastoid air-cells and included paranasal sinuses are well-aerated.The included ocular globes and orbital contents are non-suspicious. OTHER: None. ASPECTS [facility] Stroke Program Early CT Score) - Ganglionic level infarction (caudate, lentiform nuclei, internal capsule, insula, M1-M3 cortex): 7 - Supraganglionic infarction (M4-M6 cortex): 3 Total score (0-10 with 10 being normal): 10 IMPRESSION: 1. No acute intracranial process. 2. Old LEFT MCA territory infarct involving LEFT basal ganglia and LEFT temporal lobe. 3. ASPECTS is 10 . 4. Acute findings text paged to [place], Neurology via [place] system on 07/12/2017 at 6:30 pm including interpreting physician phone number. Electronically Signed   By: Elon Alas M.D.   On: 07/12/2017 18:34    Assessment and Plan:   1. Bradycardia: Patient has no symptom of dizziness, blurred vision, or feeling of passing out.  She is able to walk around at home without any issue as well.  Although sometimes her heart rate dipped into the 40s, however she does not seems to have any symptoms associated with this. HR mainly stays around 50s.  I am not confident that her bradycardia has any contribution to her  presenting symptom.  Would recommend continue Eliquis and low dose metoprolol for now  2. TIA: Although ED physician's note mentions slurred speech and right-sided weakness, patient says her right-sided weakness has been unchanged compared to the previous admission.  Her only presenting symptom was facial drooping which has resolved.  3. H/O CVA with hemorragic conversion: Occurred in May 2018, has since been placed on Eliquis in early June 2018 after cleared by neurology.  CT of head obtained on 07/12/2017 did not show any acute intracranial process.  4. Hypertension: Blood pressure fairly controlled.  5. Persistent atrial flutter on Eliquis: - CHA2DS2-Vasc score 6 (age, female, CVA, HTN) - Interestingly, cardiology service was unaware of new onset of atrial fibrillation.  Although we were consulted back in May, she was having more PACs at the time.  She was diagnosed with atrial fibrillation in early June and was placed on Eliquis on 12/23/2016.  She will need close outpatient follow-up with cardiology service.  6. CKD stage III: stable  7. HLD: FLP shows well controlled LDL with low HDL   For questions or updates, please contact [facility] Please consult [email or web address] for contact info under Cardiology/STEMI.   Hilbert Corrigan, [place]  07/13/2017 12:31 PM   Patient left AMA prior to my seeing the patient.  Agree with the plan a stated above with Almyra Deforest and will get her back into afib clinic.    Signed: Fransico Him, MD [facility] 07/13/2017

## 2017-07-13 NOTE — Progress Notes (Signed)
OT Cancellation Note  Patient Details Name: Valerie Mcclure MRN: [id number] DOB: November 15, 1940   Cancelled Treatment:    Reason Eval/Treat Not Completed: OT screened, no needs identified, will sign off.  Checked with pt/family.  Pt is at baseline and has no acute OT needs. Will sign off.  [name] 07/13/2017, 9:37 AM  Lesle Chris, OTR/L [phone] 07/13/2017

## 2017-07-13 NOTE — Progress Notes (Signed)
NEUROHOSPITALISTS STROKE TEAM - DAILY PROGRESS NOTE   ADMISSION HISTORY: Valerie Mcclure is a 76 y.o. female who is a past medical history of an embolic stroke in the left MCA territory in June 2018 with residual aphasia and mild residual right hemiparesis, hyperlipidemia, atrial fibrillation on Eliquis, who was in her usual state of health until about 1-1/2-2 days ago when the family started noticing that she has at times been more confused and unable to get her words out much worse than her baseline.  After the last stroke, she was left with moderate expressive aphasia and inability to form complete sentences.  She was doing well in terms of her motor strength with minimal right hemiparesis.  She did not have any facial droop that was residual. This afternoon, around 2 PM, the patient's husband noted that she had right facial droop and her speech was more garbled than what had been her baseline since the last stroke. He also noted that she has been dropping things more over the past day or 2 from both hands. No preceding fevers or chills.  No preceding complaints of urinary urgency or frequency or burning.  No cough shortness of breath.  No chest pain palpitations. The patient took her last dose of Eliquis this morning.  LKW: 2 days ago tpa given?: no, on Eliquis-last dose less than 12 hours ago.,  Outside the window also. Premorbid modified Rankin scale (mRS): 3 NIHSS 1a Level of Conscious.: 0 1b LOC Questions: 1 1c LOC Commands: 0 2 Best Gaze: 0 3 Visual: 0 4 Facial Palsy: 0 5a Motor Arm - left: 0 5b Motor Arm - Right: 0 6a Motor Leg - Left: 0 6b Motor Leg - Right: 0 7 Limb Ataxia: 0 8 Sensory: 0 9 Best Language: 2 10 Dysarthria: 1 11 Extinct. and Inatten.: 0 TOTAL: 4  SUBJECTIVE (INTERVAL HISTORY) Family is at the bedside. Patient is found laying in bed in NAD. Overall she feels her condition is completely resolved.  Voices no new complaints. No new events reported overnight.  OBJECTIVE Lab Results: CBC:  Recent Labs  Lab 07/12/17 1755 07/12/17 1813 07/13/17 0531  WBC 7.6  --  6.9  HGB 12.2 12.6 11.4*  HCT 37.2 37.0 34.5*  MCV 89.2  --  89.8  PLT 336  --  252   BMP: Recent Labs  Lab 07/12/17 1755 07/12/17 1813 07/13/17 0531  NA 137 139 137  K 4.1 4.1 4.3  CL 103 101 103  CO2 26  --  27  GLUCOSE 129* 127* 104*  BUN 14 16 12   CREATININE 1.44* 1.40* 1.38*  CALCIUM 9.0  --  8.9   Liver Function Tests:  Recent Labs  Lab 07/12/17 1755 07/13/17 0531  AST 21 16  ALT 11* 10*  ALKPHOS 89 76  BILITOT 0.7 0.6  PROT 7.1 6.1*  ALBUMIN 3.6 3.1*   Coagulation Studies:  Recent Labs    07/12/17 1755  APTT 34  INR 1.35   Urine Drug Screen:     Component Value Date/Time   LABOPIA POSITIVE (A) 01/12/2017 0053   COCAINSCRNUR NONE DETECTED 01/12/2017 0053   LABBENZ NONE DETECTED 01/12/2017 0053   AMPHETMU NONE DETECTED 01/12/2017 0053   THCU NONE DETECTED 01/12/2017 0053   LABBARB NONE DETECTED 01/12/2017 0053    PHYSICAL EXAM Temp:  [97.8 F (36.6 C)-98.7 F (37.1 C)] 97.9 F (36.6 C) (12/25 1008) Pulse Rate:  [52-69] 64 (12/25 0936) Resp:  [11-20] 18 (12/25 0936) BP: (  133-149)/(49-97) 137/59 (12/25 0936) SpO2:  [97 %-100 %] 100 % (12/25 1008) FiO2 (%):  [98 %] 98 % (12/24 2125) Weight:  [69.8 kg (153 lb 15.5 oz)-69.9 kg (154 lb)] 69.8 kg (153 lb 15.5 oz) (12/24 2100) General - Well nourished, well developed, in no apparent distress Respiratory - Lungs clear bilaterally. No wheezing. Cardiovascular - Regular rate and rhythm  NEURO:  Mental Status: Awake, alert, oriented to self and hospital.  Not oriented to date or time. Language: speech is mildly dysarthric.  Naming, repetition, fluency,impaired.  Moderate expressive aphasia, follows all commands.  Cranial Nerves: PERRL EOMI, visual fields full, no facial asymmetry facial sensation intact, hearing intact, tongue/uvula/soft  palate midline, normal sternocleidomastoid and trapezius muscle strength. No evidence of tongue atrophy or fibrillations Motor: Grossly symmetric antigravity strength in both upper extremities with a mild right pronator drift present but no vertical drift. Tone: is normal and bulk is normal Sensation- Intact to light touch bilaterally Coordination: FTN intact bilaterally Gait- deferred  IMAGING: I have personally reviewed the radiological images below and agree with the radiology interpretations.  MRI/ MRA Brain Wo Contrast Result Date: 07/12/2017 IMPRESSION: MRI HEAD: 1. No acute intracranial process on this mildly motion degraded examination. 2. Old LEFT MCA territory infarct involving LEFT basal ganglia and LEFT temporal lobe. MRA HEAD: 1. Moderately motion degraded examination. 2. No emergent large vessel occlusion or flow limiting stenosis. 3. Moderate bilateral M2 stenosis versus motion artifact. Electronically Signed   By: Elon Alas M.D.   On: 07/12/2017 22:11   Ct Head Code Stroke Wo Contrast Result Date: 07/12/2017 IMPRESSION: 1. No acute intracranial process. 2. Old LEFT MCA territory infarct involving LEFT basal ganglia and LEFT temporal lobe. 3. ASPECTS is 10 . 4. Acute findings text paged to [place], Neurology via [place] system on 07/12/2017 at 6:30 pm including interpreting physician phone number. Electronically Signed   By: Elon Alas M.D.   On: 07/12/2017 18:34      ASSESSMENT: Valerie Mcclure is a 76 y.o. female with PMH of embolic stroke in the left MCA territory status post TPA and IA with residual mild right hemiparesis and aphasia, hyperlipidemia, atrial fibrillation on Eliquis presenting for evaluation of intermittent worsening of her aphasia as well as intermittent right facial droop.  MRI negative for acute findings.  STROKE ruled out:  Suspected Etiology: likely  Transient recrudescence from previous stroke. Resultant Symptoms:  Expressive aphasia, right facial droop Stroke Risk Factors: atrial fibrillation, hyperlipidemia and hypertension Other Stroke Risk Factors: Advanced age,Hx stroke  Outstanding Stroke Work-up Studies:    Workup completed  07/13/2017: Neuro exam remained stable.  Patient appears to be at her physical and mentation baseline.  MRI negative for acute findings.  No need for further stroke workup.  Patient will continue her Eliquis and statin.  Outpatient EEG will be performed at neurology follow-up.   PLAN  07/13/2017: Continue Eliquis/ Statin Frequent neuro checks Telemetry monitoring PT/OT/SLP Ongoing aggressive stroke risk factor management Patient counseled to be compliant with her antithrombotic medications Patient counseled on Lifestyle modifications including, Diet, Exercise, and Stress Follow up with [facility] in 6 weeks  HX OF STROKES: June 2018- old LEFT MCA territory infarct involving LEFT basal ganglia and LEFT temporal lobe. Embolic secondary to atrial fibrillation  Baseline Findings: Right-sided weakness and aphasia  AFIB, CHRONIC: Continue Eliquis  SEIZURES: No seizure activity reported since admission.   Patient appears at her mentation baseline. Outpatient EEG at neurology follow-up  HYPERTENSION: Stable Long term BP goal normotensive. May restart home B/P medications  Home Meds: Norvasc, hydralazine, metoprolol  HYPERLIPIDEMIA:    Component Value Date/Time   CHOL 119 07/13/2017 0531   TRIG 100 07/13/2017 0531   HDL 36 (L) 07/13/2017 0531   CHOLHDL 3.3 07/13/2017 0531   VLDL 20 07/13/2017 0531   LDLCALC 63 07/13/2017 0531  Home Meds: Lipitor 5 mg LDL  goal < 70 Continued on  Lipitor to 5 mg daily Continue statin at discharge  R/O DIABETES: Lab Results  Component Value Date   HGBA1C 5.8 (H) 11/30/2016  HgbA1c goal < 7.0   Other Active Problems: Principal Problem:   TIA (transient ischemic attack) Active Problems:    Hypothyroidism   CKD (chronic kidney disease)   Anemia of chronic disease   Benign essential HTN   Global aphasia   Chronic diastolic heart failure ([name])   PAF (paroxysmal atrial fibrillation) [facility])  Hospital day # 0 VTE prophylaxis: Eliquis Diet : Diet Heart Room service appropriate? Yes; Fluid consistency: Thin   FAMILY UPDATES: family at bedside  TEAM UPDATES: Mariel Aloe, MD     Prior Home Stroke Medications:  Eliquis (apixaban) daily  Discharge Stroke Meds:  Please discharge patient on Eliquis (apixaban) daily   Disposition: 01-Home or Self Care Therapy Recs:               Home Home Equipment:         None Follow Up:  Follow-up Information    Garvin Fila, MD. Schedule an appointment as soon as possible for a visit in 6 week(s).   Specialties:  Neurology, Radiology Contact information: [address] [place] [address] [phone]          [name], MD -PCP Follow up in 1-2 weeks   Case Management aware of need  Renie Ora Stroke Neurology Team 07/13/2017 12:38 PM I have personally examined this patient, reviewed notes, independently viewed imaging studies, participated in medical decision making and plan of care.ROS completed by me personally and pertinent positives fully documented  I have made any additions or clarifications directly to the above note. Agree with note above. She presented with transient facial droop and worsening speech difficulties which appear to have resolved and brain imaging study is negative for acute stroke. She has residual aphasia and mild right hemiparesis from a prior stroke. I had a long discussion the patient and daughter at the bedside and answered questions. I do not recommend further stroke workup is necessary at this time. Patient may be discharged home. Follow-up as an outpatient in stroke clinic. Greater than 50% time during this 25 minute visit was spent on counseling and coordination of  care about her stroke and answered questions. Discussed with Dr. Vincente Poli, [place] Pager: [phone] 07/13/2017 1:00 PM  Neurology to sign-off at this time. Please call with any further questions or concerns. Thank you for this consultation.  To contact Stroke Continuity provider, please refer to [email or web address] After hours, contact General Neurology

## 2017-07-14 ENCOUNTER — Telehealth: Payer: Self-pay | Admitting: Physician Assistant

## 2017-07-14 NOTE — Telephone Encounter (Signed)
Closed Encounter  °

## 2017-07-15 ENCOUNTER — Telehealth: Payer: Self-pay | Admitting: *Deleted

## 2017-07-15 NOTE — Telephone Encounter (Signed)
Transition Care Management Follow-up Telephone Call   Date discharged? 07/13/17   How have you been since you were released from the hospital? Pt states she is doing ok   Do you understand why you were in the hospital? YES   Do you understand the discharge instructions? YES   Where were you discharged to? Home   Items Reviewed:  Medications reviewed: YES  Allergies reviewed: YES  Dietary changes reviewed: YES  Referrals reviewed: No referral needed   Functional Questionnaire:   Activities of Daily Living (ADLs):   She states she are independent in the following: bathing and hygiene, feeding, continence, grooming, toileting and dressing States she require assistance with the following: ambulation   Any transportation issues/concerns?: NO   Any patient concerns? NO   Confirmed importance and date/time of follow-up visits scheduled YES, appt 07/22/17  Provider Appointment booked with Dr, Quay Burow  Confirmed with patient if condition begins to worsen call PCP or go to the ER.  Patient was given the office number and encouraged to call back with question or concerns.  : YES

## 2017-07-21 ENCOUNTER — Ambulatory Visit (HOSPITAL_COMMUNITY)
Admission: RE | Admit: 2017-07-21 | Discharge: 2017-07-21 | Disposition: A | Discharge status: Home / Self Care | Payer: Medicare Other | Source: Ambulatory Visit | Attending: Nurse Practitioner | Admitting: Nurse Practitioner

## 2017-07-21 VITALS — BP 156/72 | HR 60 | Ht 66.0 in | Wt 162.0 lb

## 2017-07-21 DIAGNOSIS — Z90710 Acquired absence of both cervix and uterus: Secondary | ICD-10-CM | POA: Insufficient documentation

## 2017-07-21 DIAGNOSIS — M8580 Other specified disorders of bone density and structure, unspecified site: Secondary | ICD-10-CM | POA: Insufficient documentation

## 2017-07-21 DIAGNOSIS — Z791 Long term (current) use of non-steroidal anti-inflammatories (NSAID): Secondary | ICD-10-CM | POA: Insufficient documentation

## 2017-07-21 DIAGNOSIS — Z91013 Allergy to seafood: Secondary | ICD-10-CM | POA: Insufficient documentation

## 2017-07-21 DIAGNOSIS — G8929 Other chronic pain: Secondary | ICD-10-CM | POA: Diagnosis not present

## 2017-07-21 DIAGNOSIS — Z9889 Other specified postprocedural states: Secondary | ICD-10-CM | POA: Diagnosis not present

## 2017-07-21 DIAGNOSIS — Z91041 Radiographic dye allergy status: Secondary | ICD-10-CM | POA: Insufficient documentation

## 2017-07-21 DIAGNOSIS — Z888 Allergy status to other drugs, medicaments and biological substances status: Secondary | ICD-10-CM | POA: Diagnosis not present

## 2017-07-21 DIAGNOSIS — I481 Persistent atrial fibrillation: Secondary | ICD-10-CM

## 2017-07-21 DIAGNOSIS — E039 Hypothyroidism, unspecified: Secondary | ICD-10-CM | POA: Insufficient documentation

## 2017-07-21 DIAGNOSIS — Z880 Allergy status to penicillin: Secondary | ICD-10-CM | POA: Insufficient documentation

## 2017-07-21 DIAGNOSIS — Z7901 Long term (current) use of anticoagulants: Secondary | ICD-10-CM | POA: Insufficient documentation

## 2017-07-21 DIAGNOSIS — E785 Hyperlipidemia, unspecified: Secondary | ICD-10-CM | POA: Diagnosis not present

## 2017-07-21 DIAGNOSIS — I272 Pulmonary hypertension, unspecified: Secondary | ICD-10-CM | POA: Insufficient documentation

## 2017-07-21 DIAGNOSIS — I4892 Unspecified atrial flutter: Secondary | ICD-10-CM | POA: Insufficient documentation

## 2017-07-21 DIAGNOSIS — Z9049 Acquired absence of other specified parts of digestive tract: Secondary | ICD-10-CM | POA: Insufficient documentation

## 2017-07-21 DIAGNOSIS — I10 Essential (primary) hypertension: Secondary | ICD-10-CM | POA: Insufficient documentation

## 2017-07-21 DIAGNOSIS — Z79899 Other long term (current) drug therapy: Secondary | ICD-10-CM | POA: Diagnosis not present

## 2017-07-21 DIAGNOSIS — Z8673 Personal history of transient ischemic attack (TIA), and cerebral infarction without residual deficits: Secondary | ICD-10-CM | POA: Insufficient documentation

## 2017-07-21 DIAGNOSIS — I4891 Unspecified atrial fibrillation: Secondary | ICD-10-CM | POA: Insufficient documentation

## 2017-07-21 DIAGNOSIS — I4819 Other persistent atrial fibrillation: Secondary | ICD-10-CM

## 2017-07-22 ENCOUNTER — Inpatient Hospital Stay: Payer: Medicare Other | Admitting: Internal Medicine

## 2017-07-22 NOTE — Progress Notes (Signed)
Primary Care Physician: [name], MD Referring Physician: [facility] f/u   Valerie Mcclure is a 77 y.o. female with a h/o previous history of embolic CVA in July 0240, atrial fibrillation, hypertension, hypothyroidism was brought to the ER after patient was found to be increasingly confused with garbled speech and difficulty walking and right facial droop. Patient as per the family has been having over the last 2 days confusion and dropping things. This afternoon around 2 PM family noticed right facial droop and garbled speech and patient was brought to the ER.   In the ER CT head was unremarkable and neurology on call was consulted and since patient symptoms have been more than 24 hours was not felt to be a candidate for TPA. Patient admitted for further management of possible TIA versus seizures or stroke.  Concern for TIA vs stroke. MRI negative for stroke. Neurology felt patient was at baseline.  Patient with bradycardia to mid-high 30s with atrial flutter. Patient on metoprolol. Cardiology consulted secondary to bradycardia in setting of questionable facial droop and slurred speech. Cardiology recommending no change in her medication regimen  She in in the afib clinic for f/u. Here with daughter and husband.  She has had no further issues. She was diagnosed with atrial flutter in June. She is rate controlled and is on eliquis 5 mg bid which the pt/family states that she is taking on a regular basis. She/ husband also states that they can not tell she is in afib, it is not causing her any symptoms and she does not wish to restore SR.  Today, she denies symptoms of palpitations, chest pain, shortness of breath, orthopnea, PND, lower extremity edema, dizziness, presyncope, syncope, or neurologic sequela. The patient is tolerating medications without difficulties and is otherwise without complaint today.   Past Medical History:  Diagnosis Date  . Blood transfusion without reported  diagnosis   . Chronic low back pain   . Hyperlipidemia   . Osteopenia   . Stroke ([place])   . Unspecified essential hypertension   . Unspecified hypothyroidism    Past Surgical History:  Procedure Laterality Date  . ABDOMINAL HYSTERECTOMY  1970  . CHOLECYSTECTOMY  07/2009   Dr. Rise Patience  . COLONOSCOPY  2003  . FLEXIBLE SIGMOIDOSCOPY  2010  . HAND SURGERY    . IR ANGIO INTRA EXTRACRAN SEL COM CAROTID INNOMINATE UNI L MOD SED  11/29/2016  . IR ANGIO VERTEBRAL SEL SUBCLAVIAN INNOMINATE UNI R MOD SED  11/29/2016  . IR PERCUTANEOUS ART THROMBECTOMY/INFUSION INTRACRANIAL INC DIAG ANGIO  11/29/2016  . IR RADIOLOGIST EVAL & MGMT  03/24/2017  . LUMBAR LAMINECTOMY  11/2008   Done by Dr. Patrice Paradise  . RADIOLOGY WITH ANESTHESIA N/A 11/29/2016   Procedure: RADIOLOGY WITH ANESTHESIA;  Surgeon: Radiologist, Medication, MD;  Location: [facility];  Service: Radiology;  Laterality: N/A;  . THYROIDECTOMY      Current Outpatient Medications  Medication Sig Dispense Refill  . amLODipine (NORVASC) 10 MG tablet Take 0.5 tablets (5 mg total) by mouth daily. 45 tablet 1  . apixaban (ELIQUIS) 5 MG TABS tablet Take 1 tablet (5 mg total) by mouth 2 (two) times daily. 60 tablet 5  . atorvastatin (LIPITOR) 10 MG tablet Take 0.5 tablets (5 mg total) by mouth daily at 6 PM. 45 tablet 1  . Calcium Carbonate-Vit D-Min (CALCIUM 1200 PO) Take 1 tablet by mouth daily.     . Cholecalciferol (VITAMIN D3) 1000 UNITS CAPS Take 1,000 Units by mouth  daily.     Marland Kitchen FLUoxetine (PROZAC) 20 MG capsule Take 20 mg by mouth daily.    . hydrALAZINE (APRESOLINE) 25 MG tablet TAKE 1 TABLET(25 MG) BY MOUTH THREE TIMES DAILY 90 tablet 5  . levothyroxine (SYNTHROID, LEVOTHROID) 88 MCG tablet Take 1 tablet (88 mcg total) by mouth daily. 90 tablet 1  . Maltodextrin-Xanthan Gum (RESOURCE THICKENUP CLEAR) POWD Use as needed to get liquid to nectar thick consistency 3 Can 1  . metoprolol tartrate (LOPRESSOR) 25 MG tablet Take 0.5 tablets (12.5 mg total) by  mouth 2 (two) times daily. 90 tablet 1  . morphine (MS CONTIN) 15 MG 12 hr tablet Take 15 mg by mouth every 12 (twelve) hours.  0  . nortriptyline (PAMELOR) 10 MG capsule TAKE 2 CAPSULES BY MOUTH AT BEDTIME 180 capsule 1  . polycarbophil (FIBERCON) 625 MG tablet Take 2 tablets (1,250 mg total) by mouth daily. 60 tablet 0  . vitamin B-12 (CYANOCOBALAMIN) 100 MCG tablet Take 100 mcg by mouth daily.    . potassium chloride SA (K-DUR,KLOR-CON) 20 MEQ tablet Take 1 tablet (20 mEq total) by mouth once. 90 tablet 1   No current facility-administered medications for this encounter.     Allergies  Allergen Reactions  . Shrimp [Shellfish Allergy] Anaphylaxis and Rash    Break outs and swelling of the throat  . Valsartan Other (See Comments)    Renal failure  . Tandem Plus [Fefum-Fepo-Fa-B Cmp-C-Zn-Mn-Cu] Nausea And Vomiting  . Ivp Dye [Iodinated Diagnostic Agents] Rash    itching  . Penicillins Itching and Rash    Has patient had a PCN reaction causing immediate rash, facial/tongue/throat swelling, SOB or lightheadedness with hypotension: Yes Has patient had a PCN reaction causing severe rash involving mucus membranes or skin necrosis: No Has patient had a PCN reaction that required hospitalization: No Has patient had a PCN reaction occurring within the last 10 years: No If all of the above answers are "NO", then may proceed with Cephalosporin use.     Social History   Socioeconomic History  . Marital status: Married    Spouse name: Not on file  . Number of children: Not on file  . Years of education: Not on file  . Highest education level: Not on file  Social Needs  . Financial resource strain: Not on file  . Food insecurity - worry: Not on file  . Food insecurity - inability: Not on file  . Transportation needs - medical: Not on file  . Transportation needs - non-medical: Not on file  Occupational History  . Not on file  Tobacco Use  . Smoking status: Never Smoker  . Smokeless  tobacco: Never Used  Substance and Sexual Activity  . Alcohol use: No  . Drug use: No  . Sexual activity: Not Currently  Other Topics Concern  . Not on file  Social History Narrative   Married    Family History  Problem Relation Age of Onset  . Heart disease Father 21       MI age 54s  . Lung cancer Brother 83  . Colon cancer Neg Hx   . Esophageal cancer Neg Hx   . Rectal cancer Neg Hx   . Stomach cancer Neg Hx     ROS- All systems are reviewed and negative except as per the HPI above  Physical Exam: Vitals:   07/21/17 1359  BP: (!) 156/72  Pulse: 60  Weight: 162 lb (73.5 kg)  Height: 5\' 6"  (1.676  m)   Wt Readings from Last 3 Encounters:  07/21/17 162 lb (73.5 kg)  07/12/17 153 lb 15.5 oz (69.8 kg)  06/15/17 157 lb (71.2 kg)    Labs: Lab Results  Component Value Date   NA 137 07/13/2017   K 4.3 07/13/2017   CL 103 07/13/2017   CO2 27 07/13/2017   GLUCOSE 104 (H) 07/13/2017   BUN 12 07/13/2017   CREATININE 1.38 (H) 07/13/2017   CALCIUM 8.9 07/13/2017   PHOS 4.7 (H) 12/19/2016   MG 2.0 07/13/2017   Lab Results  Component Value Date   INR 1.35 07/12/2017   Lab Results  Component Value Date   CHOL 119 07/13/2017   HDL 36 (L) 07/13/2017   LDLCALC 63 07/13/2017   TRIG 100 07/13/2017     GEN- The patient is well appearing, alert and oriented x 3 today.   Head- normocephalic, atraumatic Eyes-  Sclera clear, conjunctiva pink Ears- hearing intact Oropharynx- clear Neck- supple, no JVP Lymph- no cervical lymphadenopathy Lungs- Clear to ausculation bilaterally, normal work of breathing Heart-irregular, slow rate and rhythm, no murmurs, rubs or gallops, PMI not laterally displaced GI- soft, NT, ND, + BS Extremities- no clubbing, cyanosis, or edema MS- no significant deformity or atrophy Skin- no rash or lesion Psych- euthymic mood, full affect Neuro- strength and sensation are intact  EKG-afib at 60 bpm, qrs int 106 ms, qtc 412 ms Echo-Study  Conclusions  - Left ventricle: The cavity size was normal. Wall thickness was   normal. Systolic function was normal. The estimated ejection   fraction was in the range of 55% to 60%. Although no diagnostic   regional wall motion abnormality was identified, this possibility   cannot be completely excluded on the basis of this study.   Features are consistent with a pseudonormal left ventricular   filling pattern, with concomitant abnormal relaxation and   increased filling pressure (grade 2 diastolic dysfunction). - Aortic valve: There was no stenosis. There was trivial   regurgitation. - Mitral valve: There was mild regurgitation. - Left atrium: The atrium was mildly dilated. - Right ventricle: The cavity size was normal. Systolic function   was normal. - Tricuspid valve: Peak RV-RA gradient (S): 39 mm Hg. - Pulmonary arteries: PA peak pressure: 47 mm Hg (S). - Systemic veins: IVC measured 2.0 cm with < 50% respirophasic   variation, suggesting RA pressure 8 mmHg.  Impressions:  - Normal LV size with EF 55-60%. Moderate diastolic dysfunction.   Normal RV size and systolic function. Mild pulmonary   hypertension. Mild mitral regurgitation.    Assessment and Plan: 1. Afib/fluter Pt has been in this rhythm since June and is rate controlled, ran slow in hospital but at 60 bpm today and cardiology did not recommend change in therapy. Pt is not symptomatic in afib and she/ family did not want to restore SR Continue metoprolol  tartrate12.5 mg bid  2. Chadsvasc score of 4 Continue eliquis 5 mg bid Reminded not to miss doses  Bleeding precautions discussed  Pt was suppose to have f/u with cardiology from June hospitalization Dr. Acie Fredrickson did initial  Consult so will refer to him to get established She is to f/u with PCP with repeat labs, reminded to get appointment  [name] Penny C. Garnette Greb, [facility] [address] [place], [place]  [address] [phone]

## 2017-07-29 IMAGING — RF DG SWALLOWING FUNCTION - NRPT [facility]
16 series · 16 of 16 positions shown · non-contrast
Comparison: none

[Series 1: run · 1 of 1 slices shown (1 of 16)]
[im 1/1]
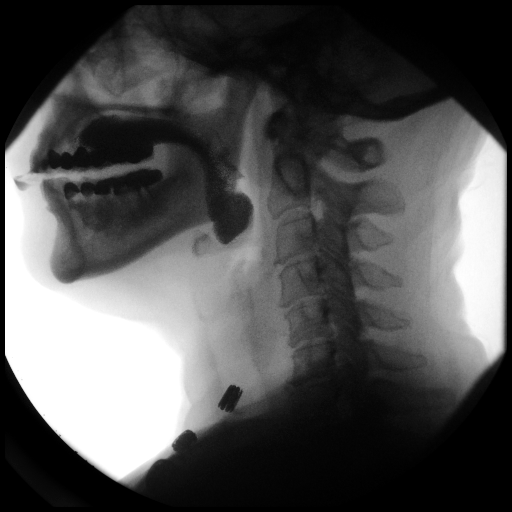

[Series 2: run · 1 of 1 slices shown (2 of 16)]
[im 1/1]
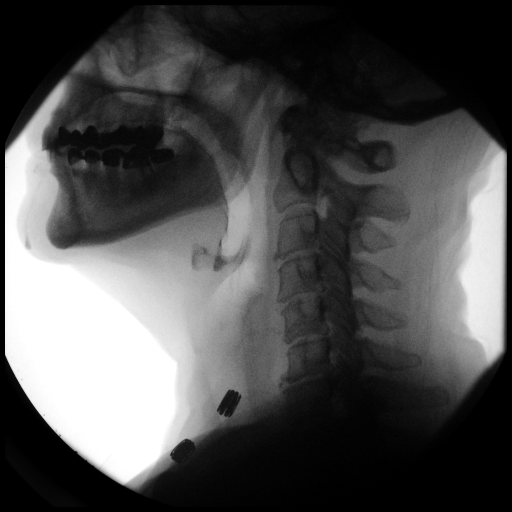

[Series 3: run · 1 of 1 slices shown (3 of 16)]
[im 1/1]
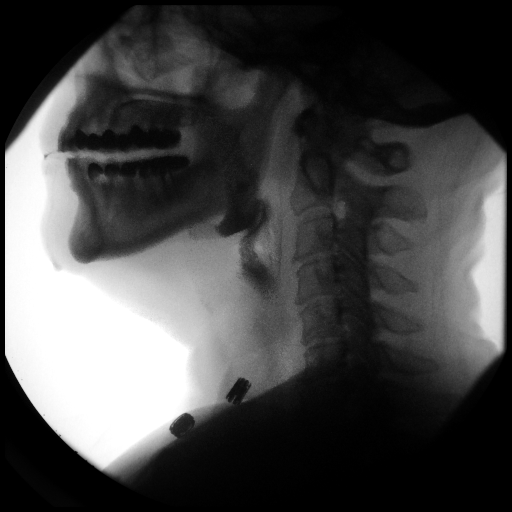

[Series 4: run · 1 of 1 slices shown (4 of 16)]
[im 1/1]
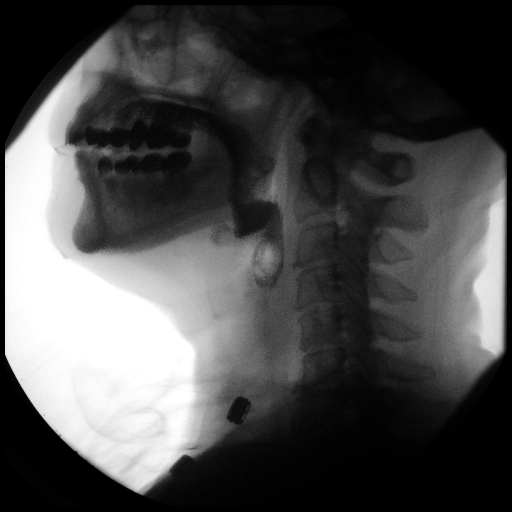

[Series 5: run · 1 of 1 slices shown (5 of 16)]
[im 1/1]
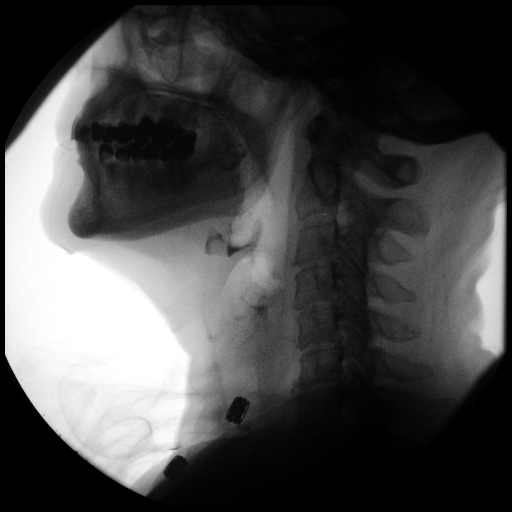

[Series 6: run · 1 of 1 slices shown (6 of 16)]
[im 1/1]
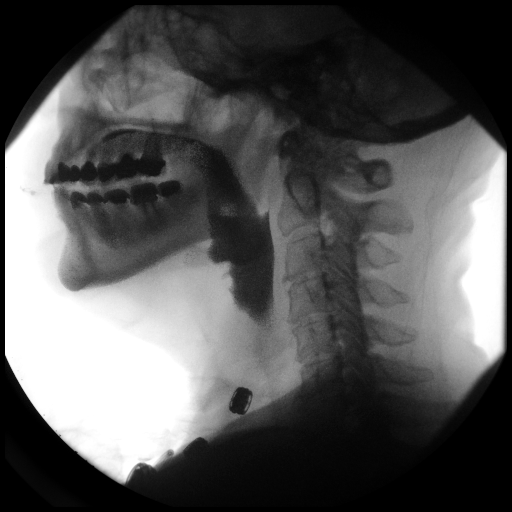

[Series 7: run · 1 of 1 slices shown (7 of 16)]
[im 1/1]
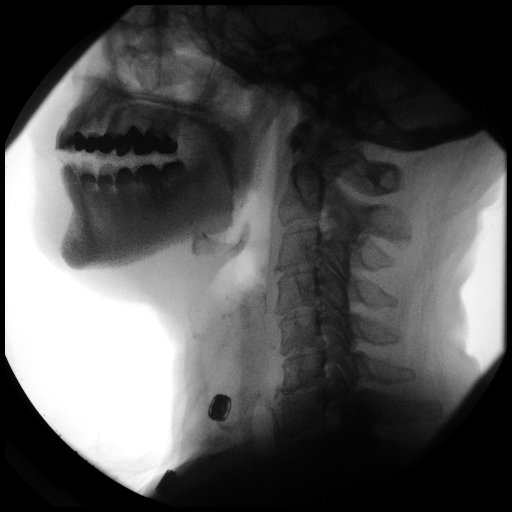

[Series 8: run · 1 of 1 slices shown (8 of 16)]
[im 1/1]
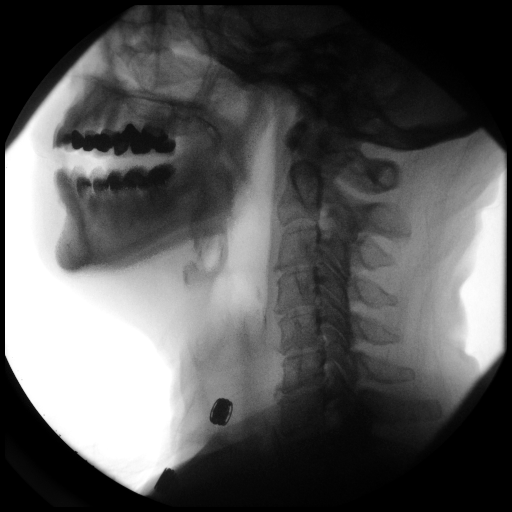

[Series 9: run · 1 of 1 slices shown (9 of 16)]
[im 1/1]
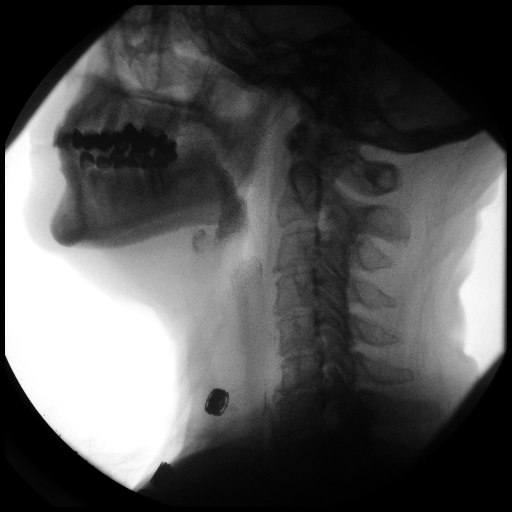

[Series 10: run · 1 of 1 slices shown (10 of 16)]
[im 1/1]
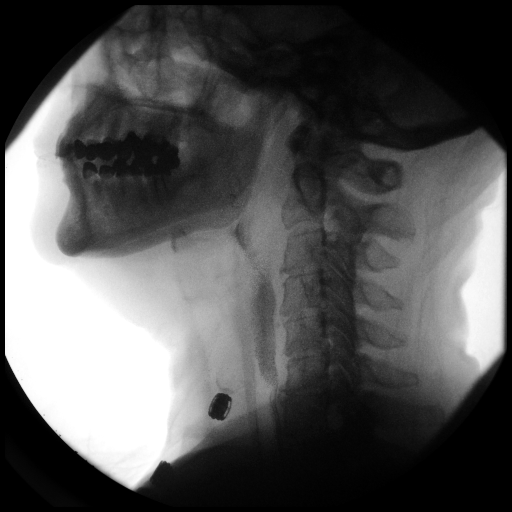

[Series 11: run · 1 of 1 slices shown (11 of 16)]
[im 1/1]
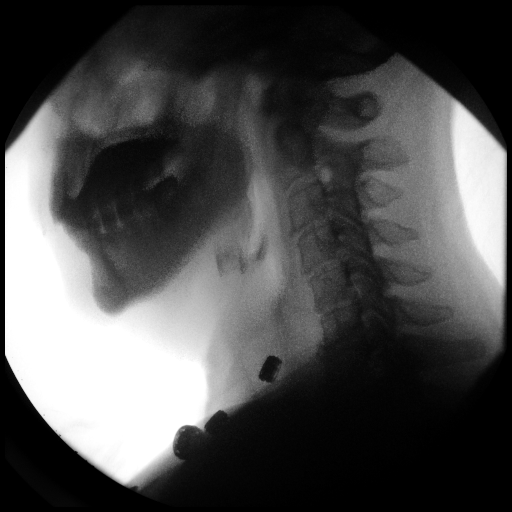

[Series 12: run · 1 of 1 slices shown (12 of 16)]
[im 1/1]
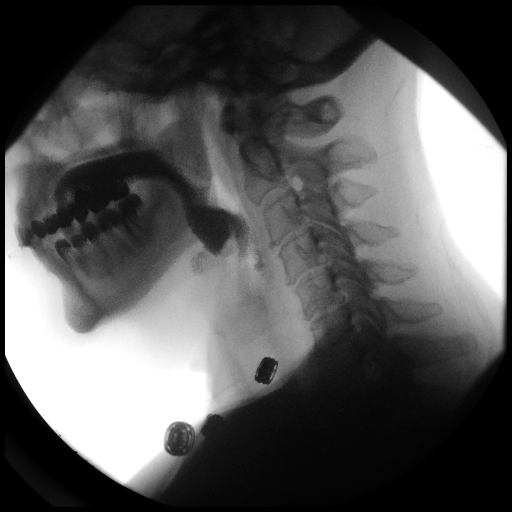

[Series 13: run · 1 of 1 slices shown (13 of 16)]
[im 1/1]
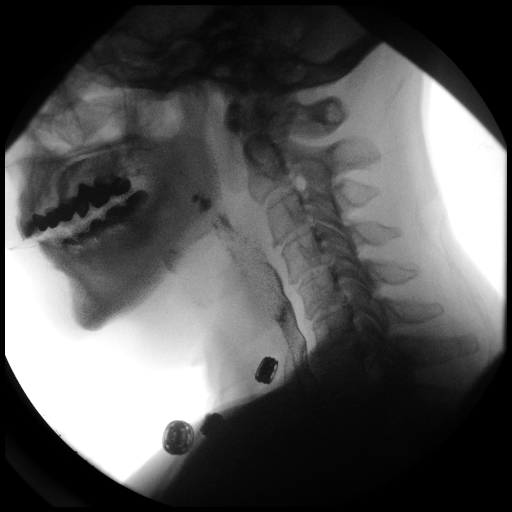

[Series 14: run · 1 of 1 slices shown (14 of 16)]
[im 1/1]
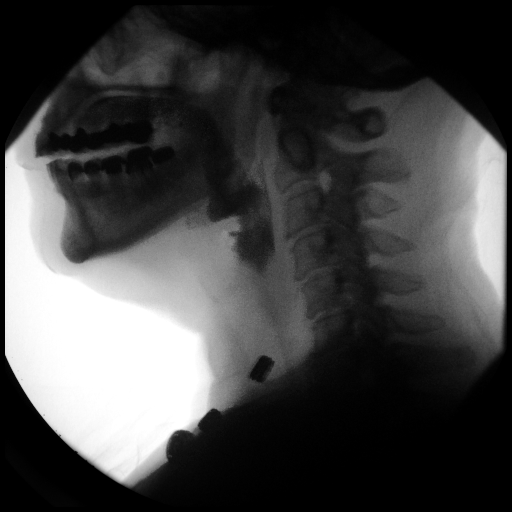

[Series 15: run · 1 of 1 slices shown (15 of 16)]
[im 1/1]
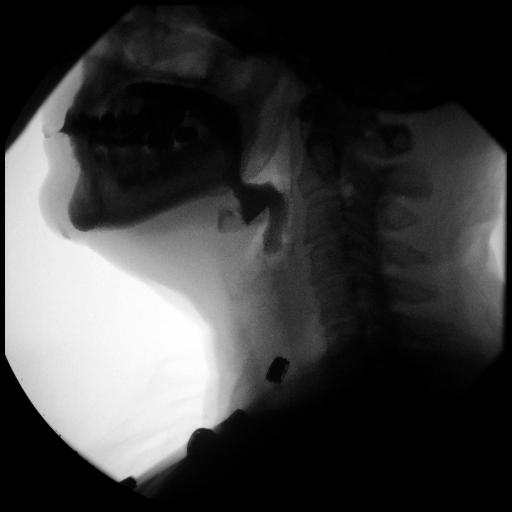

[Series 16: run · 1 of 1 slices shown (16 of 16)]
[im 1/1]
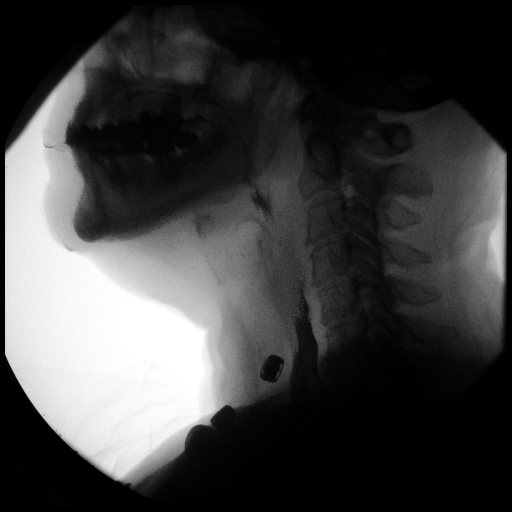

[16 of 16 positions shown; findings below may reference images not displayed]

FLUOROSCOPY FOR SWALLOWING FUNCTION STUDY:
Fluoroscopy was provided for swallowing function study, which was administered by a speech pathologist.  Final results and recommendations from this study are contained within the speech pathology report.

## 2017-07-30 ENCOUNTER — Other Ambulatory Visit: Payer: Self-pay | Admitting: Internal Medicine

## 2017-08-08 DIAGNOSIS — I6330 Cerebral infarction due to thrombosis of unspecified cerebral artery: Secondary | ICD-10-CM | POA: Diagnosis not present

## 2017-08-16 ENCOUNTER — Other Ambulatory Visit: Payer: Self-pay | Admitting: Internal Medicine

## 2017-08-16 DIAGNOSIS — M47816 Spondylosis without myelopathy or radiculopathy, lumbar region: Secondary | ICD-10-CM | POA: Diagnosis not present

## 2017-08-16 DIAGNOSIS — G894 Chronic pain syndrome: Secondary | ICD-10-CM | POA: Diagnosis not present

## 2017-08-16 DIAGNOSIS — M4155 Other secondary scoliosis, thoracolumbar region: Secondary | ICD-10-CM | POA: Diagnosis not present

## 2017-08-16 DIAGNOSIS — M25551 Pain in right hip: Secondary | ICD-10-CM | POA: Diagnosis not present

## 2017-08-23 ENCOUNTER — Ambulatory Visit: Payer: Medicare Other | Admitting: Physician Assistant

## 2017-08-30 ENCOUNTER — Other Ambulatory Visit: Payer: Self-pay | Admitting: Internal Medicine

## 2017-09-02 ENCOUNTER — Telehealth: Payer: Self-pay | Admitting: Internal Medicine

## 2017-09-02 MED ORDER — AMLODIPINE BESYLATE 10 MG PO TABS: 5.0000 mg | ORAL_TABLET | Freq: Every day | ORAL | 0 refills | Status: DC

## 2017-09-02 NOTE — Telephone Encounter (Signed)
Patient has been taking 1 tablet. I informed the daughter that this was changed back in September. She said she would let the patient know. Can a new script be sent over for her?

## 2017-09-02 NOTE — Telephone Encounter (Signed)
Copied from [place] [id number]. Topic: Quick Communication - See Telephone Encounter >> Sep 02, 2017 11:59 AM Bea Graff, NT wrote: CRM for notification. See Telephone encounter for: Pts daughter calling wanting to know why the refill request for amLODipine [facility]) was denied. Please call to discuss. CB#: [phone]  09/02/17

## 2017-09-02 NOTE — Telephone Encounter (Signed)
RX sent

## 2017-09-06 ENCOUNTER — Encounter: Payer: Self-pay | Admitting: Physician Assistant

## 2017-09-06 ENCOUNTER — Ambulatory Visit: Payer: Medicare Other | Admitting: Physician Assistant

## 2017-09-06 VITALS — BP 138/70 | HR 56 | Ht 64.0 in | Wt 162.0 lb

## 2017-09-06 DIAGNOSIS — I69359 Hemiplegia and hemiparesis following cerebral infarction affecting unspecified side: Secondary | ICD-10-CM | POA: Diagnosis not present

## 2017-09-06 DIAGNOSIS — I10 Essential (primary) hypertension: Secondary | ICD-10-CM

## 2017-09-06 DIAGNOSIS — N183 Chronic kidney disease, stage 3 unspecified: Secondary | ICD-10-CM

## 2017-09-06 DIAGNOSIS — I481 Persistent atrial fibrillation: Secondary | ICD-10-CM

## 2017-09-06 DIAGNOSIS — I4819 Other persistent atrial fibrillation: Secondary | ICD-10-CM | POA: Insufficient documentation

## 2017-09-06 NOTE — Progress Notes (Signed)
Cardiology Office Note:    Date:  09/06/2017   ID:  Valerie Mcclure, DOB 02-06-1941, MRN [id number]  PCP:  [name], MD  Cardiologist:  Mertie Moores, MD   Referring MD: [name], MD   Chief Complaint  Patient presents with  . Hospitalization Follow-up    Atrial Fibrillation    History of Present Illness:    Valerie Mcclure is a 77 y.o. female with a hx of hypertension, glucose intolerance, hyperlipidemia, chronic kidney disease, prior stroke, hypothyroidism, chronic lower extremity edema, atrial fibrillation.  She was admitted in May 2018 with left MCA infarct status post mechanical thrombectomy complicated by large left basal ganglia hemorrhage with right shift.  She was seen by Dr. Acie Fredrickson for evaluation of possible atrial fibrillation.  However, telemetry demonstrated frequent PACs and PVCs but no atrial fibrillation.  Patient was readmitted in June 2018 with opiate overdose.  She was noted to be in atrial flutter at that point and placed on Apixaban.  She was admitted again in December 2018 with confusion, garbled speech and facial droop.  There was concern for TIA versus recurrent stroke.  MRI was negative.  She was seen by cardiology for bradycardia.  She left the hospital AMA.  She did follow-up in the atrial fibrillation clinic 07/21/17.  She was in atrial fibrillation at that time with controlled rate.  Strategy of rate control was recommended.    CHADS2-VASc=6 (female, age 36, HTN, CVA).   Valerie Mcclure returns for follow-up.  She is here today with her husband.  She is overall doing well.  She denies chest discomfort or shortness of breath.  She has chronic lower extremity edema without significant change.  She denies syncope or dizziness.  She denies any bleeding issues.  Prior CV studies:   The following studies were reviewed today:  Echo 12/02/16 EF 55-60, Gr 2 DD, mild MR, mild LAE, PASP 47  Echo 08/19/16 EF 55, mild AI, MAC, mild MR, mild LAE, trivial  pericardial effusion   Past Medical History:  Diagnosis Date  . Blood transfusion without reported diagnosis   . Chronic low back pain   . History of echocardiogram    Echo 5/18: EF 55-60, Gr 2 DD, mild MR, mild LAE, PASP 47 // Echo 1/18:  EF 55, mild AI, MAC, mild MR, mild LAE, trivial pericardial effusion  . History of ischemic left MCA stroke 11/2016   left MCA infarct status post mechanical thrombectomy complicated by large left basal ganglia hemorrhage with right shift.  Marland Kitchen HTN (hypertension)   . Hyperlipidemia   . Hypothyroidism   . Osteopenia   . Persistent atrial fibrillation ([name])    CHADS2-VASc=6 (female, age 83, HTN, CVA) // Apixaban // rate control strategy     Past Surgical History:  Procedure Laterality Date  . ABDOMINAL HYSTERECTOMY  1970  . CHOLECYSTECTOMY  07/2009   Dr. Rise Patience  . COLONOSCOPY  2003  . FLEXIBLE SIGMOIDOSCOPY  2010  . HAND SURGERY    . IR ANGIO INTRA EXTRACRAN SEL COM CAROTID INNOMINATE UNI L MOD SED  11/29/2016  . IR ANGIO VERTEBRAL SEL SUBCLAVIAN INNOMINATE UNI R MOD SED  11/29/2016  . IR PERCUTANEOUS ART THROMBECTOMY/INFUSION INTRACRANIAL INC DIAG ANGIO  11/29/2016  . IR RADIOLOGIST EVAL & MGMT  03/24/2017  . LUMBAR LAMINECTOMY  11/2008   Done by Dr. Patrice Paradise  . RADIOLOGY WITH ANESTHESIA N/A 11/29/2016   Procedure: RADIOLOGY WITH ANESTHESIA;  Surgeon: Radiologist, Medication, MD;  Location: [facility];  Service: Radiology;  Laterality: N/A;  . THYROIDECTOMY      Current Medications: Current Meds  Medication Sig  . amLODipine (NORVASC) 10 MG tablet Take 0.5 tablets (5 mg total) by mouth daily.  Marland Kitchen atorvastatin (LIPITOR) 10 MG tablet Take 0.5 tablets (5 mg total) by mouth daily at 6 PM.  . Calcium Carbonate-Vit D-Min (CALCIUM 1200 PO) Take 1 tablet by mouth daily.   . Cholecalciferol (VITAMIN D3) 1000 UNITS CAPS Take 1,000 Units by mouth daily.   Marland Kitchen ELIQUIS 5 MG TABS tablet TAKE 1 TABLET(5 MG) BY MOUTH TWICE DAILY  . FLUoxetine (PROZAC) 20 MG capsule Take  20 mg by mouth daily.  . hydrALAZINE (APRESOLINE) 25 MG tablet TAKE 1 TABLET(25 MG) BY MOUTH THREE TIMES DAILY  . levothyroxine (SYNTHROID, LEVOTHROID) 88 MCG tablet Take 1 tablet (88 mcg total) by mouth daily.  . Maltodextrin-Xanthan Gum (RESOURCE THICKENUP CLEAR) POWD Use as needed to get liquid to nectar thick consistency  . metoprolol tartrate (LOPRESSOR) 25 MG tablet Take 0.5 tablets (12.5 mg total) by mouth 2 (two) times daily.  Marland Kitchen morphine (MS CONTIN) 15 MG 12 hr tablet Take 15 mg by mouth every 12 (twelve) hours.  . nortriptyline (PAMELOR) 10 MG capsule TAKE 2 CAPSULES BY MOUTH AT BEDTIME  . polycarbophil (FIBERCON) 625 MG tablet Take 2 tablets (1,250 mg total) by mouth daily.  . vitamin B-12 (CYANOCOBALAMIN) 100 MCG tablet Take 100 mcg by mouth daily.     Allergies:   Shrimp [shellfish allergy]; Valsartan; Tandem plus [fefum-fepo-fa-b [id number]]; Ivp dye [iodinated diagnostic agents]; and Penicillins   Social History   Tobacco Use  . Smoking status: Never Smoker  . Smokeless tobacco: Never Used  Substance Use Topics  . Alcohol use: No  . Drug use: No     Family Hx: The patient's family history includes Heart disease (age of onset: 66) in her father; Lung cancer (age of onset: 51) in her brother. There is no history of Colon cancer, Esophageal cancer, Rectal cancer, or Stomach cancer.  ROS:   Please see the history of present illness.    Review of Systems  Cardiovascular: Positive for leg swelling.   All other systems reviewed and are negative.   EKGs/Labs/Other Test Reviewed:    EKG:  EKG is  ordered today.  The ekg ordered today demonstrates atrial fibrillation, heart rate 56, left axis deviation, anteroseptal Q waves, inferior Q waves, QTC 416 ms, similar to prior tracings  Recent Labs: 11/30/2016: TSH 1.076 07/13/2017: ALT 10; BUN 12; Creatinine, Ser 1.38; Hemoglobin 11.4; Magnesium 2.0; Platelets 252; Potassium 4.3; Sodium 137   Recent Lipid Panel Lab Results   Component Value Date/Time   CHOL 119 07/13/2017 05:31 AM   TRIG 100 07/13/2017 05:31 AM   HDL 36 (L) 07/13/2017 05:31 AM   CHOLHDL 3.3 07/13/2017 05:31 AM   LDLCALC 63 07/13/2017 05:31 AM   LDLDIRECT 79.0 07/31/2016 11:19 AM    Physical Exam:    VS:  BP 138/70   Pulse (!) 56   Ht _0  (1.626 m)   Wt 162 lb (73.5 kg)   SpO2 94%   BMI 27.81 kg/m      Wt Readings from Last 3 Encounters:  09/06/17 162 lb (73.5 kg)  07/21/17 162 lb (73.5 kg)  07/12/17 153 lb 15.5 oz (69.8 kg)     Physical Exam  Constitutional: She is oriented to person, place, and time. She appears well-developed and well-nourished. No distress.  HENT:  Head: Normocephalic and  atraumatic.  Neck: No JVD present. Carotid bruit is not present.  Cardiovascular: Normal rate. An irregularly irregular rhythm present.  No murmur heard. Pulmonary/Chest: Effort normal. She has no rales.  Abdominal: Soft.  Musculoskeletal: She exhibits no edema.  Neurological: She is alert and oriented to person, place, and time.  Skin: Skin is warm and dry.    ASSESSMENT & PLAN:    1.  Persistent atrial fibrillation ([name]) Rate control strategy.  CHADS2-VASc= 6.  Continue apixaban.  Hemoglobin and creatinine stable in December 2018.  Plan follow-up BMET, CBC at next appointment.  2.  Hemiparesis due to old stroke History of left MCA stroke.  She has residual right-sided weakness and speech difficulty.  She is not on aspirin as she is on apixaban.  Continue statin.  3.  Essential hypertension Blood pressure somewhat above target today.  It has been well controlled in the past.  Continue current therapy.  Continue to monitor.  4.  Stage 3 chronic kidney disease ([facility])   Creatinine stable when last checked in December 2018.   Dispo:  Return in about 6 months (around 03/06/2018) for Routine Follow Up, w/ Dr. Acie Fredrickson, or Richardson Dopp, PA-C.   Medication Adjustments/Labs and Tests Ordered: Current medicines are reviewed at length  with the patient today.  Concerns regarding medicines are outlined above.  Tests Ordered: Orders Placed This Encounter  Procedures  . EKG 12-Lead   Medication Changes: No orders of the defined types were placed in this encounter.   Signed, Richardson Dopp, PA-C  09/06/2017 10:51 AM    [facility] [place], [place],   [address] Phone: [phone]; Fax: [phone]

## 2017-09-06 NOTE — Patient Instructions (Signed)
Medication Instructions:  1. Your physician recommends that you continue on your current medications as directed. Please refer to the Current Medication list given to you today.   Labwork: NONE ORDERED TODAY  Testing/Procedures: NONE ORDERED TODAY  Follow-Up: Your physician wants you to follow-up in: 6 MONTHS WITH SCOTT WEAVER, PAC... You will receive a reminder letter in the mail two months in advance. If you don't receive a letter, please call our office to schedule the follow-up appointment.   Any Other Special Instructions Will Be Listed Below (If Applicable).     If you need a refill on your cardiac medications before your next appointment, please call your pharmacy.   

## 2017-09-08 DIAGNOSIS — I6330 Cerebral infarction due to thrombosis of unspecified cerebral artery: Secondary | ICD-10-CM | POA: Diagnosis not present

## 2017-09-12 ENCOUNTER — Other Ambulatory Visit: Payer: Self-pay | Admitting: Internal Medicine

## 2017-09-14 ENCOUNTER — Ambulatory Visit: Payer: Medicare Other | Admitting: Neurology

## 2017-09-20 ENCOUNTER — Other Ambulatory Visit: Payer: Self-pay | Admitting: Internal Medicine

## 2017-09-25 NOTE — Progress Notes (Signed)
Subjective:    Patient ID: Valerie Mcclure, female    DOB: [birth year], 77 y.o.   MRN: [id number]  HPI The patient is here for follow up.  Not feeling well for about one week:  She has been tired.    Afib, CAD, Hypertension: She is taking her medication daily. She is compliant with a low sodium diet.  She denies chest pain, palpitations, edema and regular headaches. She is exercising regularly.  She does monitor her blood pressure at home.    Hyperlipidemia: She is taking her medication daily. She is compliant with a low fat/cholesterol diet. She is exercising regularly. She denies myalgias.   Hypothyroidism:  She is taking her medication daily.  She denies any recent changes in weight that are unexplained.  Her energy level has been low over the past week.    H/o CVA:  She still has some difficulty with speech and right sided weakness.  She is exercising.  She is taking all her medications as prescribed.   CKD:  She does follow with nephrology.    Prediabetes:  She is compliant with a low sugar/carbohydrate diet.  She is exercising regularly.  Chronic RUQ abdominal pain from thoracic neuropathy:  She is taking nortriptyline nightly.  Her pain is well controlled.    Depression: She is taking prozac daily as prescribed. She denies any side effects from the medication. She feels her depression is not well controlled.  She denies any anxiety.       Medications and allergies reviewed with patient and updated if appropriate.  Patient Active Problem List   Diagnosis Date Noted  . Persistent atrial fibrillation ([place]) 09/06/2017  . TIA (transient ischemic attack) 07/12/2017  . Bradycardia 04/12/2017  . Depression 01/29/2017  . Fall   . Physical deconditioning   . Aphasia as late effect of cerebrovascular accident (CVA)   . Chronic diastolic heart failure ([place])   . Dysarthria, post-stroke   . Hypoalbuminemia due to protein-calorie malnutrition ([place])   . Global aphasia   .  Encephalopathy 12/23/2016  . Acute blood loss anemia   . Tachypnea   . Toxic encephalopathy 12/22/2016  . Atrial flutter ([place]) 12/22/2016  . Hypokalemia 12/21/2016  . Obesity (BMI 30-39.9) 12/21/2016  . Spastic hemiplegia of right dominant side as late effect of cerebral infarction ([place])   . E-coli UTI   . Anemia of chronic disease   . Essential hypertension   . Dysphagia, post-stroke   . Anterior cerebral circulation hemorrhagic infarction 12/03/2016  . PAC (premature atrial contraction) 12/03/2016  . Aphasia as late effect of stroke 12/03/2016  . Right hemiparesis ([place])   . Nontraumatic subcortical hemorrhage of left cerebral hemisphere ([place])   . History of stroke 11/29/2016  . Mild aortic regurgitation 08/20/2016  . Bilateral edema of lower extremity 04/07/2016  . Pancreatic duct dilated 01/09/2016  . CKD (chronic kidney disease) 12/25/2015  . Mild tricuspid regurgitation 12/25/2015  . Mild mitral regurgitation 12/25/2015  . Prediabetes 06/10/2015  . Abdominal pain, chronic, right upper quadrant 06/04/2015  . Chronic low back pain   . PAIN IN THORACIC SPINE 04/18/2010  . Coronary atherosclerosis 01/24/2009  . DEGENERATIVE JOINT DISEASE 01/23/2009  . ARTHRITIS, LEFT KNEE 12/13/2008  . HERNIATED LUMBAR DISC 12/13/2008  . Hypothyroidism 11/21/2007  . Hyperlipidemia 11/09/2007  . Osteopenia 11/09/2007    Current Outpatient Medications on File Prior to Visit  Medication Sig Dispense Refill  . amLODipine (NORVASC) 10 MG tablet Take 0.5 tablets (5 mg  total) by mouth daily. 45 tablet 0  . atorvastatin (LIPITOR) 10 MG tablet Take 0.5 tablets (5 mg total) by mouth daily at 6 PM. 45 tablet 1  . Calcium Carbonate-Vit D-Min (CALCIUM 1200 PO) Take 1 tablet by mouth daily.     . Cholecalciferol (VITAMIN D3) 1000 UNITS CAPS Take 1,000 Units by mouth daily.     Marland Kitchen ELIQUIS 5 MG TABS tablet TAKE 1 TABLET(5 MG) BY MOUTH TWICE DAILY 60 tablet 2  . FLUoxetine (PROZAC) 20 MG capsule Take 20 mg  by mouth daily.    . hydrALAZINE (APRESOLINE) 25 MG tablet TAKE 1 TABLET(25 MG) BY MOUTH THREE TIMES DAILY 90 tablet 5  . levothyroxine (SYNTHROID, LEVOTHROID) 88 MCG tablet Take 1 tablet (88 mcg total) by mouth daily. 90 tablet 1  . Maltodextrin-Xanthan Gum (RESOURCE THICKENUP CLEAR) POWD Use as needed to get liquid to nectar thick consistency 3 Can 1  . metoprolol tartrate (LOPRESSOR) 25 MG tablet Take 0.5 tablets (12.5 mg total) by mouth 2 (two) times daily. 90 tablet 1  . metoprolol tartrate (LOPRESSOR) 25 MG tablet TAKE 1 TABLET(25 MG) BY MOUTH TWICE DAILY 180 tablet 1  . morphine (MS CONTIN) 15 MG 12 hr tablet Take 15 mg by mouth every 12 (twelve) hours.  0  . nortriptyline (PAMELOR) 10 MG capsule TAKE 2 CAPSULES BY MOUTH AT BEDTIME 180 capsule 1  . polycarbophil (FIBERCON) 625 MG tablet Take 2 tablets (1,250 mg total) by mouth daily. 60 tablet 0  . potassium chloride SA (K-DUR,KLOR-CON) 20 MEQ tablet TAKE 1 TABLET(20 MEQ) BY MOUTH 1 TIME 90 tablet 0  . vitamin B-12 (CYANOCOBALAMIN) 100 MCG tablet Take 100 mcg by mouth daily.     No current facility-administered medications on file prior to visit.     Past Medical History:  Diagnosis Date  . Blood transfusion without reported diagnosis   . Chronic low back pain   . History of echocardiogram    Echo 5/18: EF 55-60, Gr 2 DD, mild MR, mild LAE, PASP 47 // Echo 1/18:  EF 55, mild AI, MAC, mild MR, mild LAE, trivial pericardial effusion  . History of ischemic left MCA stroke 11/2016   left MCA infarct status post mechanical thrombectomy complicated by large left basal ganglia hemorrhage with right shift.  Marland Kitchen HTN (hypertension)   . Hyperlipidemia   . Hypothyroidism   . Osteopenia   . Persistent atrial fibrillation ([name])    CHADS2-VASc=6 (female, age 80, HTN, CVA) // Apixaban // rate control strategy     Past Surgical History:  Procedure Laterality Date  . ABDOMINAL HYSTERECTOMY  1970  . CHOLECYSTECTOMY  07/2009   Dr. Rise Patience  .  COLONOSCOPY  2003  . FLEXIBLE SIGMOIDOSCOPY  2010  . HAND SURGERY    . IR ANGIO INTRA EXTRACRAN SEL COM CAROTID INNOMINATE UNI L MOD SED  11/29/2016  . IR ANGIO VERTEBRAL SEL SUBCLAVIAN INNOMINATE UNI R MOD SED  11/29/2016  . IR PERCUTANEOUS ART THROMBECTOMY/INFUSION INTRACRANIAL INC DIAG ANGIO  11/29/2016  . IR RADIOLOGIST EVAL & MGMT  03/24/2017  . LUMBAR LAMINECTOMY  11/2008   Done by Dr. Patrice Paradise  . RADIOLOGY WITH ANESTHESIA N/A 11/29/2016   Procedure: RADIOLOGY WITH ANESTHESIA;  Surgeon: Radiologist, Medication, MD;  Location: [facility];  Service: Radiology;  Laterality: N/A;  . THYROIDECTOMY      Social History   Socioeconomic History  . Marital status: Married    Spouse name: None  . Number of children: None  . Years  of education: None  . Highest education level: None  Social Needs  . Financial resource strain: None  . Food insecurity - worry: None  . Food insecurity - inability: None  . Transportation needs - medical: None  . Transportation needs - non-medical: None  Occupational History  . None  Tobacco Use  . Smoking status: Never Smoker  . Smokeless tobacco: Never Used  Substance and Sexual Activity  . Alcohol use: No  . Drug use: No  . Sexual activity: Not Currently  Other Topics Concern  . None  Social History Narrative   Married    Family History  Problem Relation Age of Onset  . Heart disease Father 50       MI age 16s  . Lung cancer Brother 20  . Colon cancer Neg Hx   . Esophageal cancer Neg Hx   . Rectal cancer Neg Hx   . Stomach cancer Neg Hx     Review of Systems  Constitutional: Positive for appetite change (poor - not new) and fatigue (for about one week). Negative for chills and fever.  HENT: Negative for congestion and sore throat.   Respiratory: Positive for shortness of breath (chronic, no change). Negative for cough and wheezing.   Cardiovascular: Negative for chest pain, palpitations and leg swelling.  Gastrointestinal: Negative for abdominal  pain and nausea.       No gerd  Genitourinary: Negative for dysuria and hematuria.  Neurological: Negative for light-headedness and headaches.       Objective:   Vitals:   09/27/17 1137  BP: (!) 148/70  Pulse: 69  Resp: 16  Temp: 98.7 F (37.1 C)  SpO2: 96%   BP Readings from Last 3 Encounters:  09/27/17 (!) 148/70  09/06/17 138/70  07/21/17 (!) 156/72   Wt Readings from Last 3 Encounters:  09/27/17 158 lb (71.7 kg)  09/06/17 162 lb (73.5 kg)  07/21/17 162 lb (73.5 kg)   Body mass index is 27.12 kg/m.   Physical Exam    Constitutional: Appears well-developed and well-nourished. No distress.  HENT:  Head: Normocephalic and atraumatic.  Neck: Neck supple. No tracheal deviation present. No thyromegaly present.  No cervical lymphadenopathy Cardiovascular: Normal rate, regular rhythm and normal heart sounds.   No murmur heard. No carotid bruit .  No edema Pulmonary/Chest: Effort normal and breath sounds normal. No respiratory distress. No has no wheezes. No rales.  Skin: Skin is warm and dry. Not diaphoretic.  Psychiatric: Normal mood and affect. Behavior is normal.      Assessment & Plan:    See Problem List for Assessment and Plan of chronic medical problems.

## 2017-09-27 ENCOUNTER — Ambulatory Visit (INDEPENDENT_AMBULATORY_CARE_PROVIDER_SITE_OTHER): Payer: Medicare Other | Admitting: Internal Medicine

## 2017-09-27 ENCOUNTER — Encounter: Payer: Self-pay | Admitting: Internal Medicine

## 2017-09-27 VITALS — BP 148/70 | HR 69 | Temp 98.7°F | Resp 16 | Wt 158.0 lb

## 2017-09-27 DIAGNOSIS — G8929 Other chronic pain: Secondary | ICD-10-CM

## 2017-09-27 DIAGNOSIS — N183 Chronic kidney disease, stage 3 unspecified: Secondary | ICD-10-CM

## 2017-09-27 DIAGNOSIS — I10 Essential (primary) hypertension: Secondary | ICD-10-CM

## 2017-09-27 DIAGNOSIS — R1011 Right upper quadrant pain: Secondary | ICD-10-CM

## 2017-09-27 DIAGNOSIS — E039 Hypothyroidism, unspecified: Secondary | ICD-10-CM | POA: Diagnosis not present

## 2017-09-27 DIAGNOSIS — F3289 Other specified depressive episodes: Secondary | ICD-10-CM | POA: Diagnosis not present

## 2017-09-27 DIAGNOSIS — R7303 Prediabetes: Secondary | ICD-10-CM

## 2017-09-27 MED ORDER — FLUOXETINE HCL 40 MG PO CAPS: 40.0000 mg | ORAL_CAPSULE | Freq: Every day | ORAL | 3 refills | Status: DC

## 2017-09-27 NOTE — Patient Instructions (Addendum)
  Test(s) ordered today. Your results will be released to [facility] (or called to you) after review, usually within 72hours after test completion. If any changes need to be made, you will be notified at that same time.  Medications reviewed and updated.  Changes include increase prozac to 40 mg.    Your prescription(s) have been submitted to your pharmacy. Please take as directed and contact our office if you believe you are having problem(s) with the medication(s).  If you experience any of the following: high body temperature, agitation, increased reflexes, tremor, sweating, dilated pupils, and diarrhea call us immediately.     Please followup in 6 month

## 2017-09-27 NOTE — Assessment & Plan Note (Signed)
Controlled, stable Continue current dose of medication  

## 2017-09-27 NOTE — Assessment & Plan Note (Signed)
Not controlled Will increase prozac -- she is also on low dose nortriptyline and I think she should be ok on both, but discussed possible side effects - advised to monitor for serotonin syndrome symptoms Will checck EKG in 2 weeks

## 2017-09-27 NOTE — Assessment & Plan Note (Signed)
States she has seen nephrology cmp today

## 2017-09-27 NOTE — Assessment & Plan Note (Signed)
BP controlled at home, slightly elevated here Will monitor more closely at home - bring in readings Current regimen effective and well tolerated Continue current medications at current doses cmp

## 2017-09-27 NOTE — Assessment & Plan Note (Signed)
Has more fatigue for one week No change in weight Check tsh  Titrate med dose if needed

## 2017-09-27 NOTE — Assessment & Plan Note (Signed)
Check a1c Low sugar / carb diet Stressed regular exercise   

## 2017-10-06 DIAGNOSIS — I6330 Cerebral infarction due to thrombosis of unspecified cerebral artery: Secondary | ICD-10-CM | POA: Diagnosis not present

## 2017-10-13 ENCOUNTER — Ambulatory Visit: Payer: Medicare Other

## 2017-10-13 DIAGNOSIS — M4155 Other secondary scoliosis, thoracolumbar region: Secondary | ICD-10-CM | POA: Diagnosis not present

## 2017-10-13 DIAGNOSIS — M47816 Spondylosis without myelopathy or radiculopathy, lumbar region: Secondary | ICD-10-CM | POA: Diagnosis not present

## 2017-10-13 DIAGNOSIS — G894 Chronic pain syndrome: Secondary | ICD-10-CM | POA: Diagnosis not present

## 2017-10-13 DIAGNOSIS — M25551 Pain in right hip: Secondary | ICD-10-CM | POA: Diagnosis not present

## 2017-10-19 ENCOUNTER — Encounter: Payer: Medicare Other | Admitting: Internal Medicine

## 2017-10-19 ENCOUNTER — Ambulatory Visit: Payer: Medicare Other

## 2017-10-19 DIAGNOSIS — I4891 Unspecified atrial fibrillation: Secondary | ICD-10-CM

## 2017-10-19 NOTE — Progress Notes (Signed)
EKG today shows:  Sinus  Bradycardia at 52 bpm with occasional PAC    -Nonspecific QRS widening.   -Old inferior infarct  -Poor R-wave progression -nonspecific -consider old anterior infarct.  - QT 436    EKG 09/06/2017 Atrial fibrillation at 56 bpm Old inferior infarct, anteroseptal infarct age undetermined Poor R wave progression QT 430  No significant change in EKG     No charge for visit.  This encounter was created in error - please disregard.

## 2017-10-30 ENCOUNTER — Other Ambulatory Visit: Payer: Self-pay | Admitting: Internal Medicine

## 2017-11-08 ENCOUNTER — Other Ambulatory Visit: Payer: Self-pay | Admitting: Internal Medicine

## 2017-11-09 NOTE — Progress Notes (Deleted)
STROKE NEUROLOGY FOLLOW UP NOTE  NAME: Valerie Mcclure DOB: 05/29/1941  REASON FOR VISIT: stroke follow up HISTORY FROM: chart  Today we had the pleasure of seeing Valerie Mcclure in follow-up at our Neurology Clinic. Pt was accompanied by husband.   History Summary Valerie Mcclure a 77 y.o.femalewith history of chronic LBP, HTN, HLD, hypothyroidism admitted on 11/29/16 for aphasia and right hemiplegia on awakening.  CT head showed left temporal lobe hypodensity and hyperdense left MCA.  CT head neck showed left M1 occlusion.  CTP showed significantly penumbra.  Underwent IR and received to TICI3 revascularization of the occluded left MCA with mechanical thrombectomy.  However, post IR CT showed left BG ICH and left temporal hemorrhagic conversion, as well as bilateral tentorium SDH with midline shift.  MRI showed left MCA infarct with left BG hemorrhage.  EF 55-60%, negative for DVT.  LDL 85 and A1c 5.8.  Started Zocor and aspirin at day 7.  Initially considered TEE and loop recorder, however cardiology recommended 30-day monitoring at discharge.  At [facility], patient was found to have atrial flutter on 12/23/16, and Eliquis was started at the time.  Patient was discharged from [facility] on 01/09/17.  Readmission - on 01/11/17 for worsening right UE and LE weakness and aphasia.  MRI showed acute/early subacute infarction of left carotid body extending into periventricular white matter and within the left medial thalamus, likely extension of recent stroke.  MRA head negative.  Continue on Eliquis and statin, and discharged to CIR again.  History 06/15/17: During the interval time, the patient has been doing well.  Patient finished home health PT/OT in July, and much improved, near baseline.  However, still has intermittent speech difficulties.  Mild right-sided paraparesis.  BP today 123/59 continue on Eliquis and Lipitor without side effect.  Readmission 07/12/17: Patient came into [facility] after husband noted she had right facial droop and her speech was more garbled compared to baseline. Patient was also dropping things more over the past 2 days. CT head reviewed and negative for acute intracranial process but did show old left MCA territory infarct involving left basal ganglia and left temporal lobe. MRI negative for acute infarct. MRA did show some moderate bilateral M2 stenosis versus motion artifact.    REVIEW OF SYSTEMS: Full 14 system review of systems performed and notable only for those listed below and in HPI above, all others are negative:    The following represents the patient's updated allergies and side effects list: Allergies  Allergen Reactions  . Shrimp [Shellfish Allergy] Anaphylaxis and Rash    Break outs and swelling of the throat  . Valsartan Other (See Comments)    Renal failure  . Tandem Plus [Fefum-Fepo-Fa-B Cmp-C-Zn-Mn-Cu] Nausea And Vomiting  . Ivp Dye [Iodinated Diagnostic Agents] Rash    itching  . Penicillins Itching and Rash    Has patient had a PCN reaction causing immediate rash, facial/tongue/throat swelling, SOB or lightheadedness with hypotension: Yes Has patient had a PCN reaction causing severe rash involving mucus membranes or skin necrosis: No Has patient had a PCN reaction that required hospitalization: No Has patient had a PCN reaction occurring within the last 10 years: No If all of the above answers are "NO", then may proceed with Cephalosporin use.     The neurologically relevant items on the patient's problem list were reviewed on today's visit.  Neurologic Examination  A problem focused neurological exam (12 or more points of the single system neurologic  examination, vital signs counts as 1 point, cranial nerves count for 8 points) was performed.  There were no vitals taken for this visit.  General - Well nourished, well developed, in no apparent distress.  Ophthalmologic - Sharp disc margins OU.   Cardiovascular  - Regular rate and rhythm.  Mental Status -  Level of arousal and orientation to time, place were intact, however, not orientated to person. Intermittent speech hesitancy, mild expressive aphasia, following all commands, 2/4 naming, and difficulty repetition with complex sentences.  Cranial Nerves II - XII - II - Visual field intact OU. III, IV, VI - Extraocular movements intact. V - Facial sensation intact bilaterally. VII - Facial movement intact bilaterally. VIII - Hearing & vestibular intact bilaterally. X - Palate elevates symmetrically. XI - Chin turning & shoulder shrug intact bilaterally. XII - Tongue protrusion intact.  Motor Strength - The patient's strength was normal in all extremities except right hand mild dexterity difficulty and right arm pronator drift was present.  Bulk was normal and fasciculations were absent.   Motor Tone - Muscle tone was assessed at the neck and appendages and was normal.  Reflexes - The patient's reflexes were 1+ in all extremities and she had no pathological reflexes.  Sensory - Light touch, temperature/pinprick were assessed and were normal.    Coordination - The patient had normal movements in the hands with no ataxia or dysmetria.  Tremor was absent.  Gait and Station - walk with walker, right hemi-pleuritic gait   Functional score  mRS = 2   0 - No symptoms.   1 - No significant disability. Able to carry out all usual activities, despite some symptoms.   2 - Slight disability. Able to look after own affairs without assistance, but unable to carry out all previous activities.   3 - Moderate disability. Requires some help, but able to walk unassisted.   4 - Moderately severe disability. Unable to attend to own bodily needs without assistance, and unable to walk unassisted.   5 - Severe disability. Requires constant nursing care and attention, bedridden, incontinent.   6 - Dead.   NIH Stroke Scale = 2  Level Of Consciousness  0=Alert; keenly responsive 1=Not alert, but arousable by minor stimulation 2=Not alert, requires repeated stimulation 3=Responds only with reflex movements 0  LOC Questions to Month and Age 55=Answers both questions correctly 1=Answers one question correctly 2=Answers neither question correctly 0  LOC Commands      -Open/Close eyes     -Open/close grip 0=Performs both tasks correctly 1=Performs one task correctly 2=Performs neighter task correctly 0  Best Gaze 0=Normal 1=Partial gaze palsy 2=Forced deviation, or total gaze paresis 0  Visual 0=No visual loss 1=Partial hemianopia 2=Complete hemianopia 3=Bilateral hemianopia (blind including cortical blindness) 0  Facial Palsy 0=Normal symmetrical movement 1=Minor paralysis (asymmetry) 2=Partial paralysis (lower face) 3=Complete paralysis (upper and lower face) 0  Motor  0=No drift, limb holds posture for full 10 seconds 1=Drift, limb holds posture, no drift to bed 2=Some antigravity effort, cannot maintain posture, drifts to bed 3=No effort against gravity, limb falls 4=No movement Right Arm 0     Leg 0    Left Arm 1     Leg 0  Limb Ataxia 0=Absent 1=Present in one limb 2=Present in two limbs 0  Sensory 0=Normal 1=Mild to moderate sensory loss 2=Severe to total sensory loss 0  Best Language 0=No aphasia, normal 1=Mild to moderate aphasia 2=Mute, global aphasia 3=Mute, global aphasia 1  Dysarthria  0=Normal 1=Mild to moderate 2=Severe, unintelligible or mute/anarthric 0  Extinction/Neglect 0=No abnormality 1=Extinction to bilateral simultaneous stimulation 2=Profound neglect 0  Total   2     Data reviewed: I personally reviewed the images and agree with the radiology interpretations.  CT head WO contrast 07/12/2017 IMPRESSION: 1. No acute intracranial process. 2. Old LEFT MCA territory infarct involving LEFT basal ganglia and LEFT temporal lobe. 3. ASPECTS is 10 .  MRI brain WO contrast MRA head WO  contrast 07/12/2017 IMPRESSION: MRI HEAD: 1. No acute intracranial process on this mildly motion degraded examination. 2. Old LEFT MCA territory infarct involving LEFT basal ganglia and LEFT temporal lobe. MRA HEAD: 1. Moderately motion degraded examination. 2. No emergent large vessel occlusion or flow limiting stenosis. 3. Moderate bilateral M2 stenosis versus motion artifact.    Assessment: Valerie Mcclure is a 77 year old female with pre-medical history of strokes, HLD, HTN and A. fib on Eliquis who was recently seen on 07/12/2017 with TIA and transient rectrudescence from previous strokes. Patient previously seen by Dr. Erlinda Hong in the office on 06/15/17.    Plan:  -Continue {anticoagulants:31417}  and ***  for secondary stroke prevention -f/u PCP regarding *** management -monitor BP at home  Maintain strict control of hypertension with blood pressure goal below 130/90, diabetes with hemoglobin A1c goal below 6.5% and cholesterol with LDL cholesterol (bad cholesterol) goal below 70 mg/dL. I also advised the patient to eat a healthy diet with plenty of whole grains, cereals, fruits and vegetables, exercise regularly and maintain ideal body weight.  Followup in the future with me in ***  Greater than 50% time during this *** minute consultation visit was spent on counseling and coordination of care about ***, *** (risk factors), discussion about risk benefit of anticoagulation and answering questions.   Venancio Poisson, AGNP-BC  [facility] [address] [place], [place] [address]  Phone [phone] Fax [phone]

## 2017-11-10 ENCOUNTER — Telehealth: Payer: Self-pay | Admitting: *Deleted

## 2017-11-10 ENCOUNTER — Ambulatory Visit: Payer: Self-pay | Admitting: Adult Health

## 2017-11-10 ENCOUNTER — Telehealth: Payer: Self-pay | Admitting: Neurology

## 2017-11-10 ENCOUNTER — Encounter: Payer: Self-pay | Admitting: Adult Health

## 2017-11-10 NOTE — Telephone Encounter (Signed)
pts daughter called this morning said the pt was not feeling well and is unable to come to appt today. She was advised this is the 2nd no show and I advised her of the no show policy. She said the pt would not miss another appt. FYI

## 2017-11-10 NOTE — Telephone Encounter (Signed)
Patient canceled follow up same day due to being sick.

## 2017-11-13 ENCOUNTER — Other Ambulatory Visit: Payer: Self-pay | Admitting: Internal Medicine

## 2017-11-15 DIAGNOSIS — M47816 Spondylosis without myelopathy or radiculopathy, lumbar region: Secondary | ICD-10-CM | POA: Diagnosis not present

## 2017-11-15 DIAGNOSIS — Z79891 Long term (current) use of opiate analgesic: Secondary | ICD-10-CM | POA: Diagnosis not present

## 2017-11-15 DIAGNOSIS — M4155 Other secondary scoliosis, thoracolumbar region: Secondary | ICD-10-CM | POA: Diagnosis not present

## 2017-11-15 DIAGNOSIS — G894 Chronic pain syndrome: Secondary | ICD-10-CM | POA: Diagnosis not present

## 2017-11-23 ENCOUNTER — Encounter: Payer: Self-pay | Admitting: Adult Health

## 2017-11-23 ENCOUNTER — Ambulatory Visit: Payer: Medicare Other | Admitting: Adult Health

## 2017-11-23 VITALS — BP 140/82 | HR 81 | Ht 64.0 in | Wt 157.0 lb

## 2017-11-23 DIAGNOSIS — G8191 Hemiplegia, unspecified affecting right dominant side: Secondary | ICD-10-CM

## 2017-11-23 DIAGNOSIS — R29818 Other symptoms and signs involving the nervous system: Secondary | ICD-10-CM

## 2017-11-23 DIAGNOSIS — R4701 Aphasia: Secondary | ICD-10-CM

## 2017-11-23 DIAGNOSIS — E785 Hyperlipidemia, unspecified: Secondary | ICD-10-CM | POA: Diagnosis not present

## 2017-11-23 DIAGNOSIS — G459 Transient cerebral ischemic attack, unspecified: Secondary | ICD-10-CM | POA: Diagnosis not present

## 2017-11-23 DIAGNOSIS — I10 Essential (primary) hypertension: Secondary | ICD-10-CM

## 2017-11-23 DIAGNOSIS — Z8673 Personal history of transient ischemic attack (TIA), and cerebral infarction without residual deficits: Secondary | ICD-10-CM | POA: Diagnosis not present

## 2017-11-23 NOTE — Progress Notes (Signed)
I have reviewed and agreed above plan. 

## 2017-11-23 NOTE — Progress Notes (Signed)
STROKE NEUROLOGY FOLLOW UP NOTE  NAME: Valerie Mcclure DOB: 04/13/1941  REASON FOR VISIT: stroke follow up HISTORY FROM: chart, patient and huband  Today we had the pleasure of seeing Valerie Mcclure in follow-up at our Neurology Clinic. Pt was accompanied by husband.   History Summary Valerie Mcclure a 77 y.o.femalewith history of chronic LBP, HTN, HLD, hypothyroidism admitted on 11/29/16 for aphasia and right hemiplegia on awakening.  CT head showed left temporal lobe hypodensity and hyperdense left MCA.  CT head neck showed left M1 occlusion.  CTP showed significantly penumbra.  Underwent IR and received to TICI3 revascularization of the occluded left MCA with mechanical thrombectomy.  However, post IR CT showed left BG ICH and left temporal hemorrhagic conversion, as well as bilateral tentorium SDH with midline shift.  MRI showed left MCA infarct with left BG hemorrhage.  EF 55-60%, negative for DVT.  LDL 85 and A1c 5.8.  Started Zocor and aspirin at day 7.  Initially considered TEE and loop recorder, however cardiology recommended 30-day monitoring at discharge.  At [facility], patient was found to have atrial flutter on 12/23/16, and Eliquis was started at the time.  Patient was discharged from [place] on 01/09/17.  Readmission - on 01/11/17 for worsening right UE and LE weakness and aphasia.  MRI showed acute/early subacute infarction of left carotid body extending into periventricular white matter and within the left medial thalamus, likely extension of recent stroke.  MRA head negative.  Continue on Eliquis and statin, and discharged to CIR again.  06/15/17 visit (Dr. Erlinda Hong): During the interval time, the patient has been doing well.  Patient finished home health PT/OT in July, and much improved, near baseline.  However, still has intermittent speech difficulties.  Mild right-sided paraparesis.  BP today 123/59 continue on Eliquis and Lipitor without side effect.  Readmission 07/12/17  - Patient presented with transient facial droop and worsening speech difficulties which appear to have resolved and brain imaging study is negative for acute stroke. She has residual aphasia and mild right hemiparesis from a prior stroke. Denied worsening in baseline motor strength. Patient was compliant with Eliquis for atrial fibrillation. This was likely transient recrudescence from previous stroke. No seizure activity reported during admission. LDL 63 and recommended to continue lipitor 5mg . A1c satisfactory at 5.8. Recommended continue Eliquis. Patient discharged home.  11/23/17 update: Patient is being seen today after hospital admission for TIA.  Patient states she has returned to baseline with aphasia and right hemiparesis.  She continues to take Eliquis without side effects of bleeding or bruising.  Continues to take Lipitor without side effects of myalgias.  Pressures today satisfactory 140/82.  Patient states that she does check this at home regularly.  Currently using a cane while ambulating and has been attempting to encourage her to use this at all times.  Denies new or worsening stroke/TIA symptoms.  REVIEW OF SYSTEMS: Full 14 system review of systems performed and notable only for those listed below and in HPI above, all others are negative: Speech difficulty and weakness   The following represents the patient's updated allergies and side effects list: Allergies  Allergen Reactions  . Shrimp [Shellfish Allergy] Anaphylaxis and Rash    Break outs and swelling of the throat  . Valsartan Other (See Comments)    Renal failure  . Tandem Plus [Fefum-Fepo-Fa-B Cmp-C-Zn-Mn-Cu] Nausea And Vomiting  . Ivp Dye [Iodinated Diagnostic Agents] Rash    itching  . Penicillins Itching and Rash  Has patient had a PCN reaction causing immediate rash, facial/tongue/throat swelling, SOB or lightheadedness with hypotension: Yes Has patient had a PCN reaction causing severe rash involving mucus membranes  or skin necrosis: No Has patient had a PCN reaction that required hospitalization: No Has patient had a PCN reaction occurring within the last 10 years: No If all of the above answers are "NO", then may proceed with Cephalosporin use.     The neurologically relevant items on the patient's problem list were reviewed on today's visit.  Neurologic Examination  A problem focused neurological exam (12 or more points of the single system neurologic examination, vital signs counts as 1 point, cranial nerves count for 8 points) was performed.  Blood pressure 140/82, pulse 81, height 5\' 4"  (1.626 m), weight 157 lb (71.2 kg).  General - Well nourished, pleasant elderly Caucasian female, well developed, in no apparent distress.  Ophthalmologic - Sharp disc margins OU.   Cardiovascular - Iregular rate and rhythm.  Mental Status -  Level of arousal and orientation to time, place were intact,  Intermittent speech hesitancy, moderate expressive aphasia, following all commands, difficulty naming, and mild difficulty repetition with complex sentences.  Cranial Nerves II - XII - II - Visual field intact OU. III, IV, VI - Extraocular movements intact. V - Facial sensation intact bilaterally. VII - Facial movement intact bilaterally. VIII - Hearing & vestibular intact bilaterally. X - Palate elevates symmetrically. XI - Chin turning & shoulder shrug intact bilaterally. XII - Tongue protrusion intact.  Motor Strength - The patient's strength was normal in all extremities except mild weakness in right upper and lower extremity.  Bulk was normal and fasciculations were absent.    Motor Tone - Muscle tone was assessed at the neck and appendages and was normal.  Reflexes - The patient's reflexes were 1+ in all extremities and she had no pathological reflexes.  Sensory - Light touch, temperature/pinprick were assessed and were normal.    Coordination - The patient had normal movements in the hands  with no ataxia or dysmetria.  Tremor was absent.  Gait and Station - ambulates with four-point cane, patient is hunched leaning to left side (per patient husband, due to back pain)   Functional score  mRS = 2   0 - No symptoms.   1 - No significant disability. Able to carry out all usual activities, despite some symptoms.   2 - Slight disability. Able to look after own affairs without assistance, but unable to carry out all previous activities.   3 - Moderate disability. Requires some help, but able to walk unassisted.   4 - Moderately severe disability. Unable to attend to own bodily needs without assistance, and unable to walk unassisted.   5 - Severe disability. Requires constant nursing care and attention, bedridden, incontinent.   6 - Dead.   NIH Stroke Scale = 1 -moderate aphasia  Level Of Consciousness 0=Alert; keenly responsive 1=Not alert, but arousable by minor stimulation 2=Not alert, requires repeated stimulation 3=Responds only with reflex movements 0  LOC Questions to Month and Age 61=Answers both questions correctly 1=Answers one question correctly 2=Answers neither question correctly 0  LOC Commands      -Open/Close eyes     -Open/close grip 0=Performs both tasks correctly 1=Performs one task correctly 2=Performs neighter task correctly 0  Best Gaze 0=Normal 1=Partial gaze palsy 2=Forced deviation, or total gaze paresis 0  Visual 0=No visual loss 1=Partial hemianopia 2=Complete hemianopia 3=Bilateral hemianopia (blind including cortical blindness)  0  Facial Palsy 0=Normal symmetrical movement 1=Minor paralysis (asymmetry) 2=Partial paralysis (lower face) 3=Complete paralysis (upper and lower face) 0  Motor  0=No drift, limb holds posture for full 10 seconds 1=Drift, limb holds posture, no drift to bed 2=Some antigravity effort, cannot maintain posture, drifts to bed 3=No effort against gravity, limb falls 4=No movement Right Arm 0     Leg 0     Left Arm 0     Leg 0  Limb Ataxia 0=Absent 1=Present in one limb 2=Present in two limbs 0  Sensory 0=Normal 1=Mild to moderate sensory loss 2=Severe to total sensory loss 0  Best Language 0=No aphasia, normal 1=Mild to moderate aphasia 2=Mute, global aphasia 3=Mute, global aphasia 1  Dysarthria 0=Normal 1=Mild to moderate 2=Severe, unintelligible or mute/anarthric 0  Extinction/Neglect 0=No abnormality 1=Extinction to bilateral simultaneous stimulation 2=Profound neglect 0  Total   1     Data reviewed: I personally reviewed the images and agree with the radiology interpretations.  CT HEAD WO CONTRAST 07/12/17 IMPRESSION: 1. No acute intracranial process. 2. Old LEFT MCA territory infarct involving LEFT basal ganglia and LEFT temporal lobe. 3. ASPECTS is 10 .  MR BRAIN WO CONTRAST MR MRA HEAD WO CONTRAST 07/12/17 IMPRESSION: MRI HEAD: 1. No acute intracranial process on this mildly motion degraded examination. 2. Old LEFT MCA territory infarct involving LEFT basal ganglia and LEFT temporal lobe. MRA HEAD: 1. Moderately motion degraded examination. 2. No emergent large vessel occlusion or flow limiting stenosis. 3. Moderate bilateral M2 stenosis versus motion artifact.    Assessment: Valerie Mcclure is a 77 year old female with TIA on 07/12/17 likely transient recrudescence from previous stroke. Vascular risk factors include HTN, HLD, and previous stroke on 11/29/16 and evoluation of stroke on 01/11/17 with possible seizure activity.   Plan:  - continue eliquis and lipitor for stroke prevention - f/u PCP regarding HLD and HTN management - PCP to prescribe -referral for EEG placed as recommended during hospitalization -monitor BP at home -recommend to continue speech exercises at home - Recommend maintain blood pressure goal <130/80, diabetes with hemoglobin A1c goal below 7.0% and lipids with LDL cholesterol goal below 70 mg/dL.   Follow up in 4 months or call  earlier if needed  I spent more than 25 minutes of face to face time with the patient. Greater than 50% of time was spent in counseling and coordination of care.    Venancio Poisson, AGNP-BC  [facility] [address] [place], [place] [address]  Phone [phone] Fax [phone]

## 2017-11-23 NOTE — Patient Instructions (Addendum)
Continue Eliquis (apixaban) daily  and lipitor  for secondary stroke prevention  Continue to follow up with PCP regarding HLD and HTN management   Continue to monitor blood pressure at home  Continue to stay active and eat healthy  Continue to practice your speech at home  Maintain strict control of hypertension with blood pressure goal below 130/90, diabetes with hemoglobin A1c goal below 6.5% and cholesterol with LDL cholesterol (bad cholesterol) goal below 70 mg/dL. I also advised the patient to eat a healthy diet with plenty of whole grains, cereals, fruits and vegetables, exercise regularly and maintain ideal body weight.  Followup in the future with me in 4 months or call earlier if needed       Thank you for coming to see [place] at [facility]. I hope we have been able to provide you high quality care today.  You may receive a patient satisfaction survey over the next few weeks. We would appreciate your feedback and comments so that we may continue to improve ourselves and the health of our patients.

## 2017-12-14 ENCOUNTER — Ambulatory Visit: Payer: Medicare Other | Admitting: Nurse Practitioner

## 2017-12-14 ENCOUNTER — Other Ambulatory Visit: Payer: Self-pay | Admitting: Internal Medicine

## 2017-12-15 DIAGNOSIS — M47816 Spondylosis without myelopathy or radiculopathy, lumbar region: Secondary | ICD-10-CM | POA: Diagnosis not present

## 2017-12-15 DIAGNOSIS — Z79891 Long term (current) use of opiate analgesic: Secondary | ICD-10-CM | POA: Diagnosis not present

## 2017-12-15 DIAGNOSIS — G894 Chronic pain syndrome: Secondary | ICD-10-CM | POA: Diagnosis not present

## 2017-12-15 DIAGNOSIS — M4155 Other secondary scoliosis, thoracolumbar region: Secondary | ICD-10-CM | POA: Diagnosis not present

## 2017-12-22 ENCOUNTER — Ambulatory Visit: Payer: Medicare Other | Admitting: Neurology

## 2017-12-22 ENCOUNTER — Other Ambulatory Visit: Payer: Medicare Other

## 2017-12-22 DIAGNOSIS — R4701 Aphasia: Secondary | ICD-10-CM | POA: Diagnosis not present

## 2017-12-24 ENCOUNTER — Telehealth: Payer: Self-pay

## 2017-12-24 NOTE — Telephone Encounter (Signed)
-----   Message from Venancio Poisson, NP sent at 12/24/2017  8:07 AM EDT ----- Please call patient and notify that her EEG showed no seizure activity. Thank you.

## 2017-12-24 NOTE — Telephone Encounter (Signed)
Notes recorded by Marval Regal, RN on 12/24/2017 at 1:25 PM EDT Left vm for patient to call back during business hours for EEG results. ------

## 2017-12-27 NOTE — Telephone Encounter (Signed)
Notes recorded by Marval Regal, RN on 12/27/2017 at 9:10 AM EDT Rn receive call from daughter [name] on dpr.Rn stated the EEG showed no seizure activity. The daughter verbalized understanding. ------

## 2018-01-04 ENCOUNTER — Telehealth: Payer: Self-pay | Admitting: Internal Medicine

## 2018-01-04 NOTE — Telephone Encounter (Signed)
Copied from [place] [id number]. Topic: Quick Communication - See Telephone Encounter >> Jan 04, 2018  4:12 PM Bea Graff, NT wrote: CRM for notification. See Telephone encounter for: 01/04/18. Pts daughter [name] requesting a call from Dr. Ancil Boozer. She states her mom was started on new medication from her pain doctor and since she began the new medication she has had bad diarrhea. CB#: [phone]

## 2018-01-04 NOTE — Telephone Encounter (Signed)
Called Melanie to clarify. Note should have said Dr Quay Burow, not Dr Ancil Boozer.  I advised the daughter to contact the pain management doctor regarding the possible medication reaction. I also told her that if they were not able to help or if she had any other questions to contact our office.

## 2018-01-11 ENCOUNTER — Other Ambulatory Visit: Payer: Self-pay | Admitting: Internal Medicine

## 2018-01-12 DIAGNOSIS — Z79891 Long term (current) use of opiate analgesic: Secondary | ICD-10-CM | POA: Diagnosis not present

## 2018-01-12 DIAGNOSIS — M47816 Spondylosis without myelopathy or radiculopathy, lumbar region: Secondary | ICD-10-CM | POA: Diagnosis not present

## 2018-01-12 DIAGNOSIS — G894 Chronic pain syndrome: Secondary | ICD-10-CM | POA: Diagnosis not present

## 2018-01-12 DIAGNOSIS — M4155 Other secondary scoliosis, thoracolumbar region: Secondary | ICD-10-CM | POA: Diagnosis not present

## 2018-01-30 ENCOUNTER — Other Ambulatory Visit: Payer: Self-pay | Admitting: Internal Medicine

## 2018-02-01 ENCOUNTER — Other Ambulatory Visit: Payer: Self-pay | Admitting: Internal Medicine

## 2018-02-07 ENCOUNTER — Telehealth: Payer: Self-pay | Admitting: *Deleted

## 2018-02-07 NOTE — Telephone Encounter (Signed)
Pt medical records faxed to [facility] attn Aida Puffer RN.

## 2018-03-14 ENCOUNTER — Other Ambulatory Visit: Payer: Self-pay | Admitting: Internal Medicine

## 2018-03-25 NOTE — Progress Notes (Signed)
STROKE NEUROLOGY FOLLOW UP NOTE  NAME: Valerie Mcclure DOB: 01/16/1941  REASON FOR VISIT: stroke follow up HISTORY FROM: chart, patient and huband  Chief Complaint  Patient presents with  . Follow-up    Follow up for Stroke room in back hallway with husband Valerie Mcclure      History Summary Valerie Mcclure a 77 y.o.femalewith history of chronic LBP, HTN, HLD, hypothyroidism admitted on 11/29/16 for aphasia and right hemiplegia on awakening.  CT head showed left temporal lobe hypodensity and hyperdense left MCA.  CT head neck showed left M1 occlusion.  CTP showed significantly penumbra.  Underwent IR and received to TICI3 revascularization of the occluded left MCA with mechanical thrombectomy.  However, post IR CT showed left BG ICH and left temporal hemorrhagic conversion, as well as bilateral tentorium SDH with midline shift.  MRI showed left MCA infarct with left BG hemorrhage.  EF 55-60%, negative for DVT.  LDL 85 and A1c 5.8.  Started Zocor and aspirin at day 7.  Initially considered TEE and loop recorder, however cardiology recommended 30-day monitoring at discharge.  At [facility], patient was found to have atrial flutter on 12/23/16, and Eliquis was started at the time.  Patient was discharged from [facility] on 01/09/17.  Patient did have readmission on 01/11/2017 due to worsening right upper and lower extremity weakness with aphasia.  She was found to have a probable extension of her prior stroke.  Patient was seen in this office by Valerie Mcclure on 06/15/2017 continue to complain of intermittent speech difficulties with mild right-sided hemiparesis but otherwise doing well. Readmission on 07/12/2017 for transient facial droop and worsening speech difficulties which resolved without acute abnormalities on imaging.  Patient was diagnosed with TIA and recommended continuation of Eliquis and was discharged home.  11/23/17 visit: Patient is being seen today after hospital admission for TIA.  Patient  states she has returned to baseline with aphasia and right hemiparesis.  She continues to take Eliquis without side effects of bleeding or bruising.  Continues to take Lipitor without side effects of myalgias.  Pressures today satisfactory 140/82.  Patient states that she does check this at home regularly.  Currently using a cane while ambulating and has been attempting to encourage her to use this at all times.  Denies new or worsening stroke/TIA symptoms.  Interval history 03/28/2018: Patient is being seen today for scheduled office visit for follow up and is accompanied by her husband.  She did undergo EEG monitoring which was normal without evidence of seizure activity.  She continues to have expressive aphasia and right hand weakness with decreased dexterity.  She does state that she has been doing exercises at home that were provided prior to completing OT/ST.  She continues to take Eliquis without side effects of bleeding or bruising and is followed by Valerie Mcclure for atrial fibrillation management.  Continues to take Lipitor without side effects of myalgias.  Blood pressure today 165/71 and patient states this is normal as she does monitor it at home.  Patient does have complaints of difficulty sleeping where she may sleep for 4 to 5 hours and then will be up for the rest of the day.  She frequently awakens during those 4 to 5 hours of sleep.  She denies daytime fatigue.  Has been tested for OSA in the past and was negative.  Denies new or worsening stroke/TIA symptoms.    REVIEW OF SYSTEMS: Full 14 system review of systems performed and notable only for  those listed below and in HPI above, all others are negative: Diarrhea, back pain, aching muscles, walking difficulty, moles, and speech difficulties   The following represents the patient's updated allergies and side effects list: Allergies  Allergen Reactions  . Shrimp [Shellfish Allergy] Anaphylaxis and Rash    Break outs and swelling of the  throat  . Valsartan Other (See Comments)    Renal failure  . Tandem Plus [Fefum-Fepo-Fa-B Cmp-C-Zn-Mn-Cu] Nausea And Vomiting  . Ivp Dye [Iodinated Diagnostic Agents] Rash    itching  . Penicillins Itching and Rash    Has patient had a PCN reaction causing immediate rash, facial/tongue/throat swelling, SOB or lightheadedness with hypotension: Yes Has patient had a PCN reaction causing severe rash involving mucus membranes or skin necrosis: No Has patient had a PCN reaction that required hospitalization: No Has patient had a PCN reaction occurring within the last 10 years: No If all of the above answers are "NO", then may proceed with Cephalosporin use.     The neurologically relevant items on the patient's problem list were reviewed on today's visit.  Neurologic Examination  A problem focused neurological exam (12 or more points of the single system neurologic examination, vital signs counts as 1 point, cranial nerves count for 8 points) was performed.  Blood pressure (!) 165/71, pulse 72, weight 158 lb 6.4 oz (71.8 kg).  General - Well nourished, pleasant elderly Caucasian female, well developed, in no apparent distress.  Ophthalmologic - Sharp disc margins OU.   Cardiovascular - Iregular rate and rhythm.  Mental Status -  Level of arousal and orientation to time, place were intact,  Intermittent speech hesitancy, moderate expressive aphasia, following all commands, difficulty naming, and mild difficulty repetition with complex sentences.  Cranial Nerves II - XII - II - Visual field intact OU. III, IV, VI - Extraocular movements intact. V - Facial sensation intact bilaterally. VII - Facial movement intact bilaterally. VIII - Hearing & vestibular intact bilaterally. X - Palate elevates symmetrically. XI - Chin turning & shoulder shrug intact bilaterally. XII - Tongue protrusion intact.  Motor Strength - The patient's strength was normal in all extremities.  Bulk was normal  and fasciculations were absent.    Motor Tone - Muscle tone was assessed at the neck and appendages and was normal.  Reflexes - The patient's reflexes were 1+ in all extremities and she had no pathological reflexes.  Sensory - Light touch, temperature/pinprick were assessed and were normal.    Coordination - The patient had normal movements in the hands with no ataxia or dysmetria.  Tremor was absent.  Orbits left arm over right arm.  Decreased right finger dexterity.  Gait and Station - ambulates with assistance of husband but noted assistive device.  Does have a hunched gait leaning towards the left.      Data reviewed: I personally reviewed the images and agree with the radiology interpretations.  EEG adult 12/23/2017 Summary Normal electroencephalogram, awake, asleep and with activation procedures. There are no focal lateralizing or epileptiform features.   Assessment: Ms. Woolbright is a 77 year old female with TIA on 07/12/17 likely transient recrudescence from previous stroke. Vascular risk factors include HTN, HLD, and previous stroke on 11/29/16 and evoluation of stroke on 01/11/17 with possible seizure activity.  Patient returns today for follow-up visit and overall continues to do well from a stroke standpoint with residual expressive aphasia and mild right upper extremity weakness.  Plan:  - continue eliquis and lipitor for stroke prevention -  f/u PCP regarding HLD and HTN management - PCP to prescribe -f/u with cardiology Valerie Mcclure for atrial fibrillation management-advised patient to call office to schedule appointment as is recommended for 80-month follow-up at prior appointment on 08/2017 -Advised to continue to do OT/ST exercises at home and patient was provided with OT exercises -monitor BP at home and follow-up with PCP if remains elevated for possible change in medication management -Increase activity level and maintain healthy diet - Recommend maintain blood pressure  goal <130/80, diabetes with hemoglobin A1c goal below 7.0% and lipids with LDL cholesterol goal below 70 mg/dL.   Follow up in 6 months or call earlier if needed  I spent more than 25 minutes of face to face time with the patient. Greater than 50% of time was spent in counseling and coordination of care.    Venancio Poisson, AGNP-BC  [facility] [address] [place], [place] [address]  Phone [phone] Fax [phone]

## 2018-03-28 ENCOUNTER — Encounter: Payer: Self-pay | Admitting: Adult Health

## 2018-03-28 ENCOUNTER — Ambulatory Visit: Payer: Medicare Other | Admitting: Adult Health

## 2018-03-28 VITALS — BP 165/71 | HR 72 | Wt 158.4 lb

## 2018-03-28 DIAGNOSIS — Z8673 Personal history of transient ischemic attack (TIA), and cerebral infarction without residual deficits: Secondary | ICD-10-CM | POA: Diagnosis not present

## 2018-03-28 DIAGNOSIS — I69320 Aphasia following cerebral infarction: Secondary | ICD-10-CM

## 2018-03-28 DIAGNOSIS — I10 Essential (primary) hypertension: Secondary | ICD-10-CM | POA: Diagnosis not present

## 2018-03-28 DIAGNOSIS — E785 Hyperlipidemia, unspecified: Secondary | ICD-10-CM | POA: Diagnosis not present

## 2018-03-28 NOTE — Patient Instructions (Addendum)
Continue Eliquis (apixaban) daily  and lipitor  for secondary stroke prevention  Continue to follow up with PCP regarding cholesterol and blood pressure management   Continue to follow up with cardiology as scheduled for atrial fibrillation - call to schedule appointment with Dr. Cathie Olden at [phone]  Do exercises for continued hand weakness  Continue to monitor blood pressure at home  Maintain strict control of hypertension with blood pressure goal below 130/90, diabetes with hemoglobin A1c goal below 6.5% and cholesterol with LDL cholesterol (bad cholesterol) goal below 70 mg/dL. I also advised the patient to eat a healthy diet with plenty of whole grains, cereals, fruits and vegetables, exercise regularly and maintain ideal body weight.  Followup in the future with me in 6 months or call earlier if needed       Thank you for coming to see [place] at [facility]. I hope we have been able to provide you high quality care today.  You may receive a patient satisfaction survey over the next few weeks. We would appreciate your feedback and comments so that we may continue to improve ourselves and the health of our patients.

## 2018-03-30 ENCOUNTER — Other Ambulatory Visit: Payer: Self-pay | Admitting: Internal Medicine

## 2018-03-30 ENCOUNTER — Telehealth: Payer: Self-pay | Admitting: *Deleted

## 2018-03-30 NOTE — Progress Notes (Signed)
Subjective:    Patient ID: Valerie Mcclure, female    DOB: [birth year], 77 y.o.   MRN: [id number]  HPI The patient is here for follow up.  Afib, CAD, Hypertension: She is taking her medication daily. She is compliant with a low sodium diet.  She denies chest pain, palpitations, shortness of breath and regular headaches. She is exercising regularly - walking a lot.      Hyperlipidemia: She is taking her medication daily. She is compliant with a low fat/cholesterol diet. She is exercising regularly. She denies myalgias.   Hypothyroidism:  She is taking her medication daily.  She denies any recent changes in energy or weight that are unexplained.  She feels her energy level is good overall.  CKD: She does follow with nephrology.  She is drinking a good amount of fluids and does not take any NSAIDs.  H/o CVA with dysphagia and right sided weakness:  She is walking a lot.  Her balance is good.  She still has some right hand weakness, but feels her leg is strong.  Her husband is concerned about whether or not she can drive-he does not feel that she can.  He is concerned about her right hand weakness and possibly reaction time.  She would like to drive if possible and will only go short distances, but understands if she cannot.  Chronic RUQ pain from thoracic neuroapthy:  She is taking nortriptyline nightly.  The pain is controlled.  She has not been sleeping well and her neurologist advised asking if that medication could be increased to see if that helps with the sleep.   Depression: She is taking her medication daily as prescribed. She denies any side effects from the medication. She feels her depression is well controlled and she is happy with her current dose of medication.   Prediabetes:  She is compliant with a low sugar/carbohydrate diet.  She is exercising regularly.   Medications and allergies reviewed with patient and updated if appropriate.  Patient Active Problem List   Diagnosis Date Noted  . Persistent atrial fibrillation ([place]) 09/06/2017  . TIA (transient ischemic attack) 07/12/2017  . Bradycardia 04/12/2017  . Depression 01/29/2017  . Fall   . Physical deconditioning   . Aphasia as late effect of cerebrovascular accident (CVA)   . Chronic diastolic heart failure ([place])   . Dysarthria, post-stroke   . Hypoalbuminemia due to protein-calorie malnutrition ([place])   . Global aphasia   . Encephalopathy 12/23/2016  . Acute blood loss anemia   . Toxic encephalopathy 12/22/2016  . Atrial flutter ([place]) 12/22/2016  . Hypokalemia 12/21/2016  . Obesity (BMI 30-39.9) 12/21/2016  . Spastic hemiplegia of right dominant side as late effect of cerebral infarction ([place])   . E-coli UTI   . Anemia of chronic disease   . Essential hypertension   . Dysphagia, post-stroke   . Anterior cerebral circulation hemorrhagic infarction ([place]) 12/03/2016  . PAC (premature atrial contraction) 12/03/2016  . Aphasia as late effect of stroke 12/03/2016  . Right hemiparesis ([place])   . Nontraumatic subcortical hemorrhage of left cerebral hemisphere ([place])   . History of stroke 11/29/2016  . Mild aortic regurgitation 08/20/2016  . Bilateral edema of lower extremity 04/07/2016  . Pancreatic duct dilated 01/09/2016  . CKD (chronic kidney disease) 12/25/2015  . Mild tricuspid regurgitation 12/25/2015  . Mild mitral regurgitation 12/25/2015  . Prediabetes 06/10/2015  . Abdominal pain, chronic, right upper quadrant 06/04/2015  . Chronic low back pain   .  PAIN IN THORACIC SPINE 04/18/2010  . Coronary atherosclerosis 01/24/2009  . DEGENERATIVE JOINT DISEASE 01/23/2009  . ARTHRITIS, LEFT KNEE 12/13/2008  . HERNIATED LUMBAR DISC 12/13/2008  . Hypothyroidism 11/21/2007  . Hyperlipidemia 11/09/2007  . Osteopenia 11/09/2007    Current Outpatient Medications on File Prior to Visit  Medication Sig Dispense Refill  . amLODipine (NORVASC) 10 MG tablet TAKE 1/2 TABLET BY MOUTH DAILY 45  tablet 3  . Calcium Carbonate-Vit D-Min (CALCIUM 1200 PO) Take 1 tablet by mouth daily.     . Cholecalciferol (VITAMIN D3) 1000 UNITS CAPS Take 1,000 Units by mouth daily.     Marland Kitchen ELIQUIS 5 MG TABS tablet TAKE 1 TABLET(5 MG) BY MOUTH TWICE DAILY 180 tablet 1  . FLUoxetine (PROZAC) 40 MG capsule Take 1 capsule (40 mg total) by mouth daily. 90 capsule 3  . hydrALAZINE (APRESOLINE) 25 MG tablet TAKE 1 TABLET(25 MG) BY MOUTH THREE TIMES DAILY 270 tablet 1  . levothyroxine (SYNTHROID, LEVOTHROID) 88 MCG tablet Take 1 tablet (88 mcg total) by mouth daily before breakfast. -- LABS NEEDED FOR FURTHER REFILLS 90 tablet 0  . metoprolol tartrate (LOPRESSOR) 25 MG tablet TAKE 1 TABLET(25 MG) BY MOUTH TWICE DAILY 180 tablet 0  . nortriptyline (PAMELOR) 10 MG capsule TAKE 2 CAPSULES BY MOUTH AT BEDTIME 180 capsule 0  . polycarbophil (FIBERCON) 625 MG tablet Take 2 tablets (1,250 mg total) by mouth daily. 60 tablet 0  . potassium chloride SA (K-DUR,KLOR-CON) 20 MEQ tablet TAKE 1 TABLET(20 MEQ) BY MOUTH 1 TIME 90 tablet 1  . vitamin B-12 (CYANOCOBALAMIN) 100 MCG tablet Take 100 mcg by mouth daily.    Marland Kitchen atorvastatin (LIPITOR) 10 MG tablet Take 0.5 tablets (5 mg total) by mouth daily at 6 PM. 45 tablet 1   No current facility-administered medications on file prior to visit.     Past Medical History:  Diagnosis Date  . Blood transfusion without reported diagnosis   . Chronic low back pain   . History of echocardiogram    Echo 5/18: EF 55-60, Gr 2 DD, mild MR, mild LAE, PASP 47 // Echo 1/18:  EF 55, mild AI, MAC, mild MR, mild LAE, trivial pericardial effusion  . History of ischemic left MCA stroke 11/2016   left MCA infarct status post mechanical thrombectomy complicated by large left basal ganglia hemorrhage with right shift.  Marland Kitchen HTN (hypertension)   . Hyperlipidemia   . Hypothyroidism   . Osteopenia   . Persistent atrial fibrillation ([name])    CHADS2-VASc=6 (female, age 37, HTN, CVA) // Apixaban // rate  control strategy     Past Surgical History:  Procedure Laterality Date  . ABDOMINAL HYSTERECTOMY  1970  . CHOLECYSTECTOMY  07/2009   Dr. Rise Patience  . COLONOSCOPY  2003  . FLEXIBLE SIGMOIDOSCOPY  2010  . HAND SURGERY    . IR ANGIO INTRA EXTRACRAN SEL COM CAROTID INNOMINATE UNI L MOD SED  11/29/2016  . IR ANGIO VERTEBRAL SEL SUBCLAVIAN INNOMINATE UNI R MOD SED  11/29/2016  . IR PERCUTANEOUS ART THROMBECTOMY/INFUSION INTRACRANIAL INC DIAG ANGIO  11/29/2016  . IR RADIOLOGIST EVAL & MGMT  03/24/2017  . LUMBAR LAMINECTOMY  11/2008   Done by Dr. Patrice Paradise  . RADIOLOGY WITH ANESTHESIA N/A 11/29/2016   Procedure: RADIOLOGY WITH ANESTHESIA;  Surgeon: Radiologist, Medication, MD;  Location: [facility];  Service: Radiology;  Laterality: N/A;  . THYROIDECTOMY      Social History   Socioeconomic History  . Marital status: Married  Spouse name: Not on file  . Number of children: Not on file  . Years of education: Not on file  . Highest education level: Not on file  Occupational History  . Not on file  Social Needs  . Financial resource strain: Not on file  . Food insecurity:    Worry: Not on file    Inability: Not on file  . Transportation needs:    Medical: Not on file    Non-medical: Not on file  Tobacco Use  . Smoking status: Never Smoker  . Smokeless tobacco: Never Used  Substance and Sexual Activity  . Alcohol use: No  . Drug use: No  . Sexual activity: Not Currently  Lifestyle  . Physical activity:    Days per week: Not on file    Minutes per session: Not on file  . Stress: Not on file  Relationships  . Social connections:    Talks on phone: Not on file    Gets together: Not on file    Attends religious service: Not on file    Active member of club or organization: Not on file    Attends meetings of clubs or organizations: Not on file    Relationship status: Not on file  Other Topics Concern  . Not on file  Social History Narrative   Married    Family History  Problem  Relation Age of Onset  . Heart disease Father 39       MI age 67s  . Lung cancer Brother 34  . Colon cancer Neg Hx   . Esophageal cancer Neg Hx   . Rectal cancer Neg Hx   . Stomach cancer Neg Hx     Review of Systems  Constitutional: Negative for chills and fever.  Respiratory: Negative for cough, shortness of breath and wheezing.   Cardiovascular: Positive for leg swelling. Negative for chest pain and palpitations.  Musculoskeletal: Negative for gait problem.  Neurological: Positive for weakness (right hand). Negative for light-headedness and headaches.       Objective:   Vitals:   03/31/18 1017  BP: 122/62  Pulse: (!) 55  Resp: 16  Temp: 98.6 F (37 C)  SpO2: 93%   BP Readings from Last 3 Encounters:  03/31/18 122/62  03/28/18 (!) 165/71  11/23/17 140/82   Wt Readings from Last 3 Encounters:  03/31/18 162 lb (73.5 kg)  03/28/18 158 lb 6.4 oz (71.8 kg)  11/23/17 157 lb (71.2 kg)   Body mass index is 27.81 kg/m.   Physical Exam    Constitutional: Appears well-developed and well-nourished. No distress.  HENT:  Head: Normocephalic and atraumatic.  Neck: Neck supple. No tracheal deviation present. No thyromegaly present.  No cervical lymphadenopathy Cardiovascular: Normal rate, regular rhythm and normal heart sounds.   2/6 systolic  murmur heard. No carotid bruit .  Trace bilateral lower extremity edema Pulmonary/Chest: Effort normal and breath sounds normal. No respiratory distress. No has no wheezes. No rales.  Skin: Skin is warm and dry. Not diaphoretic.  Psychiatric: Normal mood and affect. Behavior is normal.      Assessment & Plan:    See Problem List for Assessment and Plan of chronic medical problems.

## 2018-03-30 NOTE — Progress Notes (Addendum)
Subjective:   Valerie Mcclure is a 77 y.o. female who presents for Medicare Annual (Subsequent) preventive examination.  Review of Systems:  No ROS.  Medicare Wellness Visit. Additional risk factors are reflected in the social history.  Cardiac Risk Factors include: advanced age (>15mn, >>63women);dyslipidemia;hypertension Sleep patterns: feels rested on waking, gets up 1 times nightly to void and sleeps 7-8 hours nightly.    Home Safety/Smoke Alarms: Feels safe in home. Smoke alarms in place.  Living environment; residence and Firearm Safety: apartment, no firearms.Lives with husband, no needs for DME, good support system Seat Belt Safety/Bike Helmet: Wears seat belt.      Objective:     Vitals: BP 122/62   Pulse (!) 55   Resp 16   Ht _0  (1.626 m)   Wt 162 lb (73.5 kg)   SpO2 93%   BMI 27.81 kg/m   Body mass index is 27.81 kg/m.  Advanced Directives 03/31/2018 07/12/2017 07/12/2017 04/13/2017 04/13/2017 02/12/2017 01/26/2017  Does Patient Have a Medical Advance Directive? _1  No No  Does patient want to make changes to medical advance directive? Yes (ED - Information included in AVS) - - - - - -  Would patient like information on creating a medical advance directive? - No - Patient declined No - Patient declined No - Patient declined No - Patient declined No - Patient declined No - Patient declined    Tobacco Social History   Tobacco Use  Smoking Status Never Smoker  Smokeless Tobacco Never Used     Counseling given: Not Answered  Past Medical History:  Diagnosis Date  . Blood transfusion without reported diagnosis   . Chronic low back pain   . History of echocardiogram    Echo 5/18: EF 55-60, Gr 2 DD, mild MR, mild LAE, PASP 47 // Echo 1/18:  EF 55, mild AI, MAC, mild MR, mild LAE, trivial pericardial effusion  . History of ischemic left MCA stroke 11/2016   left MCA infarct status post mechanical thrombectomy complicated by large left basal  ganglia hemorrhage with right shift.  .Marland KitchenHTN (hypertension)   . Hyperlipidemia   . Hypothyroidism   . Osteopenia   . Persistent atrial fibrillation ([name])    CHADS2-VASc=6 (female, age 77 HTN, CVA) // Apixaban // rate control strategy    Past Surgical History:  Procedure Laterality Date  . ABDOMINAL HYSTERECTOMY  1970  . CHOLECYSTECTOMY  07/2009   Dr. WRise Patience . COLONOSCOPY  2003  . FLEXIBLE SIGMOIDOSCOPY  2010  . HAND SURGERY    . IR ANGIO INTRA EXTRACRAN SEL COM CAROTID INNOMINATE UNI L MOD SED  11/29/2016  . IR ANGIO VERTEBRAL SEL SUBCLAVIAN INNOMINATE UNI R MOD SED  11/29/2016  . IR PERCUTANEOUS ART THROMBECTOMY/INFUSION INTRACRANIAL INC DIAG ANGIO  11/29/2016  . IR RADIOLOGIST EVAL & MGMT  03/24/2017  . LUMBAR LAMINECTOMY  11/2008   Done by Dr. CPatrice Paradise . RADIOLOGY WITH ANESTHESIA N/A 11/29/2016   Procedure: RADIOLOGY WITH ANESTHESIA;  Surgeon: Radiologist, Medication, MD;  [place]  Service: Radiology;  Laterality: N/A;  . THYROIDECTOMY     Family History  Problem Relation Age of Onset  . Heart disease Father 644      MI age 3434s . Lung cancer Brother 87 . Colon cancer Neg Hx   . Esophageal cancer Neg Hx   . Rectal cancer Neg Hx   . Stomach cancer Neg Hx    Social  History   Socioeconomic History  . Marital status: Married    Spouse name: Not on file  . Number of children: 1  . Years of education: Not on file  . Highest education level: Not on file  Occupational History  . Occupation: retired  Scientific laboratory technician  . Financial resource strain: Not very hard  . Food insecurity:    Worry: Never true    Inability: Never true  . Transportation needs:    Medical: No    Non-medical: No  Tobacco Use  . Smoking status: Never Smoker  . Smokeless tobacco: Never Used  Substance and Sexual Activity  . Alcohol use: No  . Drug use: No  . Sexual activity: Not Currently  Lifestyle  . Physical activity:    Days per week: 0 days    Minutes per session: 0 min  . Stress: Not  at all  Relationships  . Social connections:    Talks on phone: More than three times a week    Gets together: More than three times a week    Attends religious service: 1 to 4 times per year    Active member of club or organization: Yes    Attends meetings of clubs or organizations: 1 to 4 times per year    Relationship status: Married  Other Topics Concern  . Not on file  Social History Narrative   Married    Outpatient Encounter Medications as of 03/31/2018  Medication Sig  . amLODipine (NORVASC) 10 MG tablet TAKE 1/2 TABLET BY MOUTH DAILY  . Calcium Carbonate-Vit D-Min (CALCIUM 1200 PO) Take 1 tablet by mouth daily.   . Cholecalciferol (VITAMIN D3) 1000 UNITS CAPS Take 1,000 Units by mouth daily.   Marland Kitchen ELIQUIS 5 MG TABS tablet TAKE 1 TABLET(5 MG) BY MOUTH TWICE DAILY  . FLUoxetine (PROZAC) 40 MG capsule Take 1 capsule (40 mg total) by mouth daily.  . hydrALAZINE (APRESOLINE) 25 MG tablet TAKE 1 TABLET(25 MG) BY MOUTH THREE TIMES DAILY  . levothyroxine (SYNTHROID, LEVOTHROID) 88 MCG tablet Take 1 tablet (88 mcg total) by mouth daily before breakfast. -- LABS NEEDED FOR FURTHER REFILLS  . metoprolol tartrate (LOPRESSOR) 25 MG tablet TAKE 1 TABLET(25 MG) BY MOUTH TWICE DAILY  . nortriptyline (PAMELOR) 10 MG capsule TAKE 2 CAPSULES BY MOUTH AT BEDTIME  . polycarbophil (FIBERCON) 625 MG tablet Take 2 tablets (1,250 mg total) by mouth daily.  . potassium chloride SA (K-DUR,KLOR-CON) 20 MEQ tablet TAKE 1 TABLET(20 MEQ) BY MOUTH 1 TIME  . vitamin B-12 (CYANOCOBALAMIN) 100 MCG tablet Take 100 mcg by mouth daily.  Marland Kitchen atorvastatin (LIPITOR) 10 MG tablet Take 0.5 tablets (5 mg total) by mouth daily at 6 PM.   No facility-administered encounter medications on file as of 03/31/2018.     Activities of Daily Living In your present state of health, do you have any difficulty performing the following activities: 03/31/2018 07/12/2017  Hearing? N N  Vision? N N  Difficulty concentrating or making  decisions? N N  Walking or climbing stairs? N Y  Dressing or bathing? N N  Doing errands, shopping? N Y  Conservation officer, nature and eating ? N -  Using the Toilet? N -  In the past six months, have you accidently leaked urine? N -  Do you have problems with loss of bowel control? N -  Managing your Medications? N -  Managing your Finances? N -  Housekeeping or managing your Housekeeping? N -  Some recent data might  be hidden    Patient Care Team: [name], MD as PCP - General (Internal Medicine) Nahser, Wonda Cheng, MD as PCP - Cardiology (Cardiology) Starling Manns, MD as Consulting Physician (Orthopedic Surgery) Nicholaus Bloom, MD as Consulting Physician (Anesthesiology) Gatha Mayer, MD as Consulting Physician (Gastroenterology)    Assessment:   This is a routine wellness examination for [place]. Physical assessment deferred to PCP.   Exercise Activities and Dietary recommendations Current Exercise Habits: Home exercise routine, Type of exercise: walking;stretching(chair exercise), Time (Minutes): 30, Frequency (Times/Week): 5, Weekly Exercise (Minutes/Week): 150, Intensity: Mild, Exercise limited by: orthopedic condition(s)  Diet (meal preparation, eat out, water intake, caffeinated beverages, dairy products, fruits and vegetables): in general, a "healthy" diet  , well balanced   Reviewed heart healthy diet. Encouraged patient to increase daily water and healthy fluid intake.  Goals    . Patient Stated     Maintain current health status.       Fall Risk Fall Risk  03/31/2018 03/28/2018 11/23/2017 04/21/2017 02/12/2017  Falls in the past year? No No No No Yes  Comment - - - - no recent  Number falls in past yr: - - - - 2 or more  Injury with Fall? - - - - -  Risk Factor Category  - - - - High Fall Risk  Risk for fall due to : - - - - -  Follow up - - - - Falls prevention discussed    Depression Screen PHQ 2/9 Scores 03/31/2018 02/12/2017 01/26/2017 01/21/2017  PHQ - 2 Score _0 -  PHQ- 9 Score _1 -  Exception Documentation - - - Other- indicate reason in comment box  Not completed - - - unable to assess, spoke with husband     Cognitive Function MMSE - Mini Mental State Exam 03/31/2018  Orientation to time 5  Orientation to Place 4  Registration 3  Attention/ Calculation 3  Recall 2  Language- name 2 objects 2  Language- repeat 1  Language- follow 3 step command 3  Language- read & follow direction 1  Write a sentence 1  Copy design 1  Total score 26        Immunization History  Administered Date(s) Administered  . Influenza Split 04/22/2011, 05/11/2013  . Influenza, High Dose Seasonal PF 06/04/2015, 04/07/2016, 04/12/2017, 03/31/2018  . Influenza,inj,Quad PF,6+ Mos 04/03/2014  . Pneumococcal Conjugate-13 12/25/2015  . Pneumococcal Polysaccharide-23 11/18/2010  . Td 07/20/2005   Screening Tests Health Maintenance  Topic Date Due  . TETANUS/TDAP  07/21/2015  . INFLUENZA VACCINE  02/17/2018  . DEXA SCAN  Completed  . PNA vac Low Risk Adult  Completed      Plan:    Continue doing brain stimulating activities (puzzles, reading, adult coloring books, staying active) to keep memory sharp.   Continue to eat heart healthy diet (full of fruits, vegetables, whole grains, lean protein, water--limit salt, fat, and sugar intake) and increase physical activity as tolerated.  I have personally reviewed and noted the following in the patient's chart:   . Medical and social history . Use of alcohol, tobacco or illicit drugs  . Current medications and supplements . Functional ability and status . Nutritional status . Physical activity . Advanced directives . List of other physicians . Vitals . Screenings to include cognitive, depression, and falls . Referrals and appointments  In addition, I have reviewed and discussed with patient certain preventive protocols, quality metrics, and best practice  recommendations. A written personalized care  plan for preventive services as well as general preventive health recommendations were provided to patient.     Michiel Cowboy, RN  03/31/2018   Medical screening examination/treatment/procedure(s) were performed by non-physician practitioner and as supervising physician I was immediately available for consultation/collaboration. I agree with above. [name], MD

## 2018-03-30 NOTE — Telephone Encounter (Signed)
Called and spoke with daughter to see if patient would be available to do her AWV after she sees PCP at 1030 03/31/18. An appointment was scheduled.

## 2018-03-31 ENCOUNTER — Ambulatory Visit (INDEPENDENT_AMBULATORY_CARE_PROVIDER_SITE_OTHER): Payer: Medicare Other | Admitting: Internal Medicine

## 2018-03-31 ENCOUNTER — Encounter: Payer: Self-pay | Admitting: Internal Medicine

## 2018-03-31 ENCOUNTER — Other Ambulatory Visit (INDEPENDENT_AMBULATORY_CARE_PROVIDER_SITE_OTHER): Payer: Medicare Other

## 2018-03-31 ENCOUNTER — Ambulatory Visit (INDEPENDENT_AMBULATORY_CARE_PROVIDER_SITE_OTHER): Payer: Medicare Other | Admitting: *Deleted

## 2018-03-31 VITALS — BP 122/62 | HR 55 | Temp 98.6°F | Resp 16 | Ht 64.0 in | Wt 162.0 lb

## 2018-03-31 VITALS — BP 122/62 | HR 55 | Resp 16 | Ht 64.0 in | Wt 162.0 lb

## 2018-03-31 DIAGNOSIS — E785 Hyperlipidemia, unspecified: Secondary | ICD-10-CM

## 2018-03-31 DIAGNOSIS — Z23 Encounter for immunization: Secondary | ICD-10-CM

## 2018-03-31 DIAGNOSIS — R7303 Prediabetes: Secondary | ICD-10-CM

## 2018-03-31 DIAGNOSIS — E039 Hypothyroidism, unspecified: Secondary | ICD-10-CM | POA: Diagnosis not present

## 2018-03-31 DIAGNOSIS — I10 Essential (primary) hypertension: Secondary | ICD-10-CM | POA: Diagnosis not present

## 2018-03-31 DIAGNOSIS — N183 Chronic kidney disease, stage 3 unspecified: Secondary | ICD-10-CM

## 2018-03-31 DIAGNOSIS — G479 Sleep disorder, unspecified: Secondary | ICD-10-CM

## 2018-03-31 DIAGNOSIS — I69320 Aphasia following cerebral infarction: Secondary | ICD-10-CM

## 2018-03-31 DIAGNOSIS — Z0000 Encounter for general adult medical examination without abnormal findings: Secondary | ICD-10-CM | POA: Diagnosis not present

## 2018-03-31 DIAGNOSIS — I2510 Atherosclerotic heart disease of native coronary artery without angina pectoris: Secondary | ICD-10-CM

## 2018-03-31 DIAGNOSIS — G8929 Other chronic pain: Secondary | ICD-10-CM

## 2018-03-31 DIAGNOSIS — R600 Localized edema: Secondary | ICD-10-CM

## 2018-03-31 DIAGNOSIS — R1011 Right upper quadrant pain: Secondary | ICD-10-CM

## 2018-03-31 DIAGNOSIS — F3289 Other specified depressive episodes: Secondary | ICD-10-CM

## 2018-03-31 LAB — COMPREHENSIVE METABOLIC PANEL
ALBUMIN: 4.3 g/dL (ref 3.5–5.2)
ALT: 15 U/L (ref 0–35)
AST: 18 U/L (ref 0–37)
Alkaline Phosphatase: 60 U/L (ref 39–117)
BUN: 16 mg/dL (ref 6–23)
CHLORIDE: 102 meq/L (ref 96–112)
CO2: 28 mEq/L (ref 19–32)
CREATININE: 1.3 mg/dL — AB (ref 0.40–1.20)
Calcium: 9.8 mg/dL (ref 8.4–10.5)
GFR: 42.04 mL/min — ABNORMAL LOW (ref >60.00)
Glucose, Bld: 95 mg/dL (ref 70–99)
Potassium: 4.5 mEq/L (ref 3.5–5.1)
SODIUM: 139 meq/L (ref 135–145)
TOTAL PROTEIN: 7.3 g/dL (ref 6.0–8.3)
Total Bilirubin: 0.4 mg/dL (ref 0.2–1.2)

## 2018-03-31 LAB — LIPID PANEL
CHOLESTEROL: 158 mg/dL (ref 0–200)
HDL: 48.1 mg/dL (ref >39.00)
LDL CALC: 72 mg/dL (ref 0–99)
NonHDL: 109.46
Total CHOL/HDL Ratio: 3
Triglycerides: 185 mg/dL — ABNORMAL HIGH (ref 0.0–149.0)
VLDL: 37 mg/dL (ref 0.0–40.0)

## 2018-03-31 LAB — CBC WITH DIFFERENTIAL/PLATELET
Basophils Absolute: 0.1 10*3/uL (ref 0.0–0.1)
Basophils Relative: 0.5 % (ref 0.0–3.0)
EOS PCT: 1 % (ref 0.0–5.0)
Eosinophils Absolute: 0.1 10*3/uL (ref 0.0–0.7)
HEMATOCRIT: 40.6 % (ref 36.0–46.0)
HEMOGLOBIN: 13.9 g/dL (ref 12.0–15.0)
LYMPHS ABS: 1.9 10*3/uL (ref 0.7–4.0)
Lymphocytes Relative: 19 % (ref 12.0–46.0)
MCHC: 34.3 g/dL (ref 30.0–36.0)
MCV: 91.6 fl (ref 78.0–100.0)
Monocytes Absolute: 0.7 10*3/uL (ref 0.1–1.0)
Monocytes Relative: 6.6 % (ref 3.0–12.0)
NEUTROS PCT: 72.9 % (ref 43.0–77.0)
Neutro Abs: 7.3 10*3/uL (ref 1.4–7.7)
Platelets: 289 10*3/uL (ref 150.0–400.0)
RBC: 4.44 Mil/uL (ref 3.87–5.11)
RDW: 14.9 % (ref 11.5–15.5)
WBC: 10.1 10*3/uL (ref 4.0–10.5)

## 2018-03-31 LAB — HEMOGLOBIN A1C: Hgb A1c MFr Bld: 5.9 % (ref 4.6–6.5)

## 2018-03-31 LAB — TSH: TSH: 0.13 u[IU]/mL — AB (ref 0.35–4.50)

## 2018-03-31 NOTE — Assessment & Plan Note (Signed)
No chest pain, palps, sob Continue current medications Cmp, cbc

## 2018-03-31 NOTE — Assessment & Plan Note (Signed)
Controlled with daily lasix Continue cmp

## 2018-03-31 NOTE — Assessment & Plan Note (Signed)
Check a1c Low sugar / carb diet Stressed regular exercise   

## 2018-03-31 NOTE — Assessment & Plan Note (Signed)
Taking nortriptyline - will increase the dose to help her sleep

## 2018-03-31 NOTE — Assessment & Plan Note (Signed)
Controlled, stable Continue current dose of medication  

## 2018-03-31 NOTE — Patient Instructions (Addendum)
Continue doing brain stimulating activities (puzzles, reading, adult coloring books, staying active) to keep memory sharp.   Continue to eat heart healthy diet (full of fruits, vegetables, whole grains, lean protein, water--limit salt, fat, and sugar intake) and increase physical activity as tolerated.   [name] , Thank you for taking time to come for your Medicare Wellness Visit. I appreciate your ongoing commitment to your health goals. Please review the following plan we discussed and let me know if I can assist you in the future.   These are the goals we discussed: Goals    . Patient Stated     Maintain current health status.       This is a list of the screening recommended for you and due dates:  Health Maintenance  Topic Date Due  . Tetanus Vaccine  07/21/2015  . Flu Shot  02/17/2018  . DEXA scan (bone density measurement)  Completed  . Pneumonia vaccines  Completed   Health Maintenance, Female Adopting a healthy lifestyle and getting preventive care can go a long way to promote health and wellness. Talk with your health care provider about what schedule of regular examinations is right for you. This is a good chance for you to check in with your provider about disease prevention and staying healthy. In between checkups, there are plenty of things you can do on your own. Experts have done a lot of research about which lifestyle changes and preventive measures are most likely to keep you healthy. Ask your health care provider for more information. Weight and diet Eat a healthy diet  Be sure to include plenty of vegetables, fruits, low-fat dairy products, and lean protein.  Do not eat a lot of foods high in solid fats, added sugars, or salt.  Get regular exercise. This is one of the most important things you can do for your health. ? Most adults should exercise for at least 150 minutes each week. The exercise should increase your heart rate and make you sweat  (moderate-intensity exercise). ? Most adults should also do strengthening exercises at least twice a week. This is in addition to the moderate-intensity exercise.  Maintain a healthy weight  Body mass index (BMI) is a measurement that can be used to identify possible weight problems. It estimates body fat based on height and weight. Your health care provider can help determine your BMI and help you achieve or maintain a healthy weight.  For females 23 years of age and older: ? A BMI below 18.5 is considered underweight. ? A BMI of 18.5 to 24.9 is normal. ? A BMI of 25 to 29.9 is considered overweight. ? A BMI of 30 and above is considered obese.  Watch levels of cholesterol and blood lipids  You should start having your blood tested for lipids and cholesterol at 78 years of age, then have this test every 5 years.  You may need to have your cholesterol levels checked more often if: ? Your lipid or cholesterol levels are high. ? You are older than 78 years of age. ? You are at high risk for heart disease.  Cancer screening Lung Cancer  Lung cancer screening is recommended for adults 74-80 years old who are at high risk for lung cancer because of a history of smoking.  A yearly low-dose CT scan of the lungs is recommended for people who: ? Currently smoke. ? Have quit within the past 15 years. ? Have at least a 30-pack-year history of smoking. A  pack year is smoking an average of one pack of cigarettes a day for 1 year.  Yearly screening should continue until it has been 15 years since you quit.  Yearly screening should stop if you develop a health problem that would prevent you from having lung cancer treatment.  Breast Cancer  Practice breast self-awareness. This means understanding how your breasts normally appear and feel.  It also means doing regular breast self-exams. Let your health care provider know about any changes, no matter how small.  If you are in your 20s or  30s, you should have a clinical breast exam (CBE) by a health care provider every 1-3 years as part of a regular health exam.  If you are 2 or older, have a CBE every year. Also consider having a breast X-ray (mammogram) every year.  If you have a family history of breast cancer, talk to your health care provider about genetic screening.  If you are at high risk for breast cancer, talk to your health care provider about having an MRI and a mammogram every year.  Breast cancer gene (BRCA) assessment is recommended for women who have family members with BRCA-related cancers. BRCA-related cancers include: ? Breast. ? Ovarian. ? Tubal. ? Peritoneal cancers.  Results of the assessment will determine the need for genetic counseling and BRCA1 and BRCA2 testing.  Cervical Cancer Your health care provider may recommend that you be screened regularly for cancer of the pelvic organs (ovaries, uterus, and vagina). This screening involves a pelvic examination, including checking for microscopic changes to the surface of your cervix (Pap test). You may be encouraged to have this screening done every 3 years, beginning at age 30.  For women ages 30-65, health care providers may recommend pelvic exams and Pap testing every 3 years, or they may recommend the Pap and pelvic exam, combined with testing for human papilloma virus (HPV), every 5 years. Some types of HPV increase your risk of cervical cancer. Testing for HPV may also be done on women of any age with unclear Pap test results.  Other health care providers may not recommend any screening for nonpregnant women who are considered low risk for pelvic cancer and who do not have symptoms. Ask your health care provider if a screening pelvic exam is right for you.  If you have had past treatment for cervical cancer or a condition that could lead to cancer, you need Pap tests and screening for cancer for at least 20 years after your treatment. If Pap tests  have been discontinued, your risk factors (such as having a new sexual partner) need to be reassessed to determine if screening should resume. Some women have medical problems that increase the chance of getting cervical cancer. In these cases, your health care provider may recommend more frequent screening and Pap tests.  Colorectal Cancer  This type of cancer can be detected and often prevented.  Routine colorectal cancer screening usually begins at 78 years of age and continues through 78 years of age.  Your health care provider may recommend screening at an earlier age if you have risk factors for colon cancer.  Your health care provider may also recommend using home test kits to check for hidden blood in the stool.  A small camera at the end of a tube can be used to examine your colon directly (sigmoidoscopy or colonoscopy). This is done to check for the earliest forms of colorectal cancer.  Routine screening usually begins at age 79.  Direct examination of the colon should be repeated every 5-10 years through 78 years of age. However, you may need to be screened more often if early forms of precancerous polyps or small growths are found.  Skin Cancer  Check your skin from head to toe regularly.  Tell your health care provider about any new moles or changes in moles, especially if there is a change in a mole's shape or color.  Also tell your health care provider if you have a mole that is larger than the size of a pencil eraser.  Always use sunscreen. Apply sunscreen liberally and repeatedly throughout the day.  Protect yourself by wearing long sleeves, pants, a wide-brimmed hat, and sunglasses whenever you are outside.  Heart disease, diabetes, and high blood pressure  High blood pressure causes heart disease and increases the risk of stroke. High blood pressure is more likely to develop in: ? People who have blood pressure in the high end of the normal range (130-139/85-89 mm  Hg). ? People who are overweight or obese. ? People who are African American.  If you are 28-9 years of age, have your blood pressure checked every 3-5 years. If you are 59 years of age or older, have your blood pressure checked every year. You should have your blood pressure measured twice-once when you are at a hospital or clinic, and once when you are not at a hospital or clinic. Record the average of the two measurements. To check your blood pressure when you are not at a hospital or clinic, you can use: ? An automated blood pressure machine at a pharmacy. ? A home blood pressure monitor.  If you are between 9 years and 23 years old, ask your health care provider if you should take aspirin to prevent strokes.  Have regular diabetes screenings. This involves taking a blood sample to check your fasting blood sugar level. ? If you are at a normal weight and have a low risk for diabetes, have this test once every three years after 78 years of age. ? If you are overweight and have a high risk for diabetes, consider being tested at a younger age or more often. Preventing infection Hepatitis B  If you have a higher risk for hepatitis B, you should be screened for this virus. You are considered at high risk for hepatitis B if: ? You were born in a country where hepatitis B is common. Ask your health care provider which countries are considered high risk. ? Your parents were born in a high-risk country, and you have not been immunized against hepatitis B (hepatitis B vaccine). ? You have HIV or AIDS. ? You use needles to inject street drugs. ? You live with someone who has hepatitis B. ? You have had sex with someone who has hepatitis B. ? You get hemodialysis treatment. ? You take certain medicines for conditions, including cancer, organ transplantation, and autoimmune conditions.  Hepatitis C  Blood testing is recommended for: ? Everyone born from [birth year] through 1966. ? Anyone with known  risk factors for hepatitis C.  Sexually transmitted infections (STIs)  You should be screened for sexually transmitted infections (STIs) including gonorrhea and chlamydia if: ? You are sexually active and are younger than 78 years of age. ? You are older than 78 years of age and your health care provider tells you that you are at risk for this type of infection. ? Your sexual activity has changed since you were last screened and  you are at an increased risk for chlamydia or gonorrhea. Ask your health care provider if you are at risk.  If you do not have HIV, but are at risk, it may be recommended that you take a prescription medicine daily to prevent HIV infection. This is called pre-exposure prophylaxis (PrEP). You are considered at risk if: ? You are sexually active and do not regularly use condoms or know the HIV status of your partner(s). ? You take drugs by injection. ? You are sexually active with a partner who has HIV.  Talk with your health care provider about whether you are at high risk of being infected with HIV. If you choose to begin PrEP, you should first be tested for HIV. You should then be tested every 3 months for as long as you are taking PrEP. Pregnancy  If you are premenopausal and you may become pregnant, ask your health care provider about preconception counseling.  If you may become pregnant, take 400 to 800 micrograms (mcg) of folic acid every day.  If you want to prevent pregnancy, talk to your health care provider about birth control (contraception). Osteoporosis and menopause  Osteoporosis is a disease in which the bones lose minerals and strength with aging. This can result in serious bone fractures. Your risk for osteoporosis can be identified using a bone density scan.  If you are 16 years of age or older, or if you are at risk for osteoporosis and fractures, ask your health care provider if you should be screened.  Ask your health care provider whether you  should take a calcium or vitamin D supplement to lower your risk for osteoporosis.  Menopause may have certain physical symptoms and risks.  Hormone replacement therapy may reduce some of these symptoms and risks. Talk to your health care provider about whether hormone replacement therapy is right for you. Follow these instructions at home:  Schedule regular health, dental, and eye exams.  Stay current with your immunizations.  Do not use any tobacco products including cigarettes, chewing tobacco, or electronic cigarettes.  If you are pregnant, do not drink alcohol.  If you are breastfeeding, limit how much and how often you drink alcohol.  Limit alcohol intake to no more than 1 drink per day for nonpregnant women. One drink equals 12 ounces of beer, 5 ounces of Chane Magner, or 1 ounces of hard liquor.  Do not use street drugs.  Do not share needles.  Ask your health care provider for help if you need support or information about quitting drugs.  Tell your health care provider if you often feel depressed.  Tell your health care provider if you have ever been abused or do not feel safe at home. This information is not intended to replace advice given to you by your health care provider. Make sure you discuss any questions you have with your health care provider. Document Released: 01/19/2011 Document Revised: 12/12/2015 Document Reviewed: 04/09/2015 Elsevier Interactive Patient Education  [employer].

## 2018-03-31 NOTE — Assessment & Plan Note (Signed)
Stable Continue atorvastatin, Eliquis Blood pressure well controlled Check CBC, CMP, lipid panel

## 2018-03-31 NOTE — Assessment & Plan Note (Signed)
Check lipid panel  Continue daily statin Regular exercise and healthy diet encouraged  

## 2018-03-31 NOTE — Assessment & Plan Note (Signed)
She is having difficulty sleeping She is Artie taking nortriptyline for thoracic radiculopathy-we will try increasing the dose Currently on 20 mg nightly-increase to 30 mg.  After few days if she would like to try higher dose and is not having any side effects can go to 40 mg and then after a few days 50 mg.  She will not go higher than 50 mg without consulting me She will update me as per what dose is most effective without side effects

## 2018-03-31 NOTE — Assessment & Plan Note (Signed)
Following with nephrology Stable cmp today

## 2018-03-31 NOTE — Assessment & Plan Note (Signed)
Clinically euthyroid Check tsh  Titrate med dose if needed  

## 2018-03-31 NOTE — Patient Instructions (Addendum)
  Test(s) ordered today. Your results will be released to [place] (or called to you) after review, usually within 72hours after test completion. If any changes need to be made, you will be notified at that same time.   Flu immunization administered today.    Medications reviewed and updated.  Changes include:  Increasing nortriptyline to 30 mg daily.  You may increase to 40 mg after a few days if needed and then up to 50 mg a few days later if needed. Update [place] with what does is best.       Please followup in 6 months

## 2018-03-31 NOTE — Assessment & Plan Note (Signed)
BP well controlled Current regimen effective and well tolerated Continue current medications at current doses cmp  

## 2018-04-01 MED ORDER — LEVOTHYROXINE SODIUM 75 MCG PO TABS: 75.0000 ug | ORAL_TABLET | Freq: Every day | ORAL | 0 refills | Status: DC

## 2018-04-03 NOTE — Progress Notes (Signed)
I agree with the above plan 

## 2018-04-16 ENCOUNTER — Other Ambulatory Visit: Payer: Self-pay | Admitting: Internal Medicine

## 2018-04-19 ENCOUNTER — Other Ambulatory Visit: Payer: Self-pay | Admitting: Internal Medicine

## 2018-04-19 MED ORDER — NORTRIPTYLINE HCL 10 MG PO CAPS: 40.0000 mg | ORAL_CAPSULE | Freq: Every day | ORAL | 1 refills | Status: DC

## 2018-04-19 NOTE — Telephone Encounter (Signed)
Copied from [place] [id number]. Topic: Quick Communication - Rx Refill/Question >> Apr 19, 2018 12:06 PM Reyne Dumas L wrote: Medication: nortriptyline (PAMELOR) 10 MG capsule - states physician recommended change from 10mg  to 40mg  capsule.  Has the patient contacted their pharmacy? No - change in dose (Agent: If no, request that the patient contact the pharmacy for the refill.) (Agent: If yes, when and what did the pharmacy advise?)  Preferred Pharmacy (with phone number or street name): [id number] - [place], [place] [phone] (Phone) [phone] (Fax)  Agent: Please be advised that RX refills may take up to 3 business days. We ask that you follow-up with your pharmacy.

## 2018-05-01 ENCOUNTER — Other Ambulatory Visit: Payer: Self-pay | Admitting: Internal Medicine

## 2018-05-06 ENCOUNTER — Other Ambulatory Visit: Payer: Self-pay | Admitting: Internal Medicine

## 2018-05-06 MED ORDER — HYDRALAZINE HCL 25 MG PO TABS: ORAL_TABLET | ORAL | 1 refills | Status: DC

## 2018-05-06 NOTE — Telephone Encounter (Signed)
Copied from [place] [id number]. Topic: General - Other >> May 06, 2018  2:36 PM Lennox Solders wrote: Reason for CRM: pt daughter [name] is calling and the pharm said hydralazine rx has been closed. Pt needs refill on hydralazine #270 for 90 day supply. [facility]

## 2018-06-07 NOTE — Progress Notes (Signed)
Subjective:    Patient ID: Valerie Mcclure, female    DOB: 08/29/1940, 76 y.o.   MRN: [id number]  HPI The patient is here for follow up.  Bump in left neck:  It is nontender.  She is unsure how long it has been there.  She denies any change in size.  She denies other lumps or bumps.     Afib, CAD, Hypertension: She is taking her medication daily. She is compliant with a low sodium diet.  She has intermittent leg edema.  She denies chest pain, palpitations,  shortness of breath and regular headaches. She is exercising regularly.  She does not monitor her blood pressure at home.    Hyperlipidemia: She is taking her medication daily. She is compliant with a low fat/cholesterol diet. She is exercising regularly. She denies myalgias.   Hypothyroidism:  She is taking her medication daily.  She denies any recent changes in energy or weight that are unexplained.   CKD:  She is following with nephrology.  H/o CVA with dysphagia and right sided weakness:  She is doing her exercises daily.  She is taking her medications as prescribed.    Depression: She is taking her medication daily as prescribed. She denies any side effects from the medication. She feels her depression is well controlled and she is happy with her current dose of medication.   Prediabetes:  She is compliant with a low sugar/carbohydrate diet.  She is exercising regularly.  Chronic RUQ pain from thoracic neuropathy:  She takes nortriptyline nightly.  Her pain is controlled.    Medications and allergies reviewed with patient and updated if appropriate.  Patient Active Problem List   Diagnosis Date Noted  . Sleep difficulties 03/31/2018  . Persistent atrial fibrillation 09/06/2017  . TIA (transient ischemic attack) 07/12/2017  . Bradycardia 04/12/2017  . Depression 01/29/2017  . Fall   . Aphasia as late effect of cerebrovascular accident (CVA)   . Chronic diastolic heart failure ([place])   . Dysarthria, post-stroke   .  Global aphasia   . Atrial flutter ([place]) 12/22/2016  . Obesity (BMI 30-39.9) 12/21/2016  . Spastic hemiplegia of right dominant side as late effect of cerebral infarction ([place])   . Anemia of chronic disease   . Essential hypertension   . Dysphagia, post-stroke   . Anterior cerebral circulation hemorrhagic infarction ([place]) 12/03/2016  . PAC (premature atrial contraction) 12/03/2016  . Aphasia as late effect of stroke 12/03/2016  . Right hemiparesis ([place])   . Nontraumatic subcortical hemorrhage of left cerebral hemisphere ([place])   . History of stroke 11/29/2016  . Mild aortic regurgitation 08/20/2016  . Bilateral edema of lower extremity 04/07/2016  . Pancreatic duct dilated 01/09/2016  . CKD (chronic kidney disease) 12/25/2015  . Mild tricuspid regurgitation 12/25/2015  . Mild mitral regurgitation 12/25/2015  . Prediabetes 06/10/2015  . Abdominal pain, chronic, right upper quadrant 06/04/2015  . Chronic low back pain   . PAIN IN THORACIC SPINE 04/18/2010  . Coronary atherosclerosis 01/24/2009  . DEGENERATIVE JOINT DISEASE 01/23/2009  . ARTHRITIS, LEFT KNEE 12/13/2008  . HERNIATED LUMBAR DISC 12/13/2008  . Hypothyroidism 11/21/2007  . Hyperlipidemia 11/09/2007  . Osteopenia 11/09/2007    Current Outpatient Medications on File Prior to Visit  Medication Sig Dispense Refill  . amLODipine (NORVASC) 10 MG tablet TAKE 1/2 TABLET BY MOUTH DAILY 45 tablet 3  . atorvastatin (LIPITOR) 10 MG tablet TAKE 1/2 TABLET BY MOUTH DAILY AT 6 PM 90 tablet 0  .  Calcium Carbonate-Vit D-Min (CALCIUM 1200 PO) Take 1 tablet by mouth daily.     . Cholecalciferol (VITAMIN D3) 1000 UNITS CAPS Take 1,000 Units by mouth daily.     Marland Kitchen ELIQUIS 5 MG TABS tablet TAKE 1 TABLET(5 MG) BY MOUTH TWICE DAILY 180 tablet 1  . FLUoxetine (PROZAC) 40 MG capsule Take 1 capsule (40 mg total) by mouth daily. 90 capsule 3  . hydrALAZINE (APRESOLINE) 25 MG tablet TAKE 1 TABLET(25 MG) BY MOUTH THREE TIMES DAILY 270 tablet 1  .  levothyroxine (SYNTHROID, LEVOTHROID) 75 MCG tablet Take 1 tablet (75 mcg total) by mouth daily. 90 tablet 0  . metoprolol tartrate (LOPRESSOR) 25 MG tablet TAKE 1 TABLET(25 MG) BY MOUTH TWICE DAILY 180 tablet 0  . nortriptyline (PAMELOR) 10 MG capsule Take 4 capsules (40 mg total) by mouth at bedtime. 360 capsule 1  . polycarbophil (FIBERCON) 625 MG tablet Take 2 tablets (1,250 mg total) by mouth daily. 60 tablet 0  . potassium chloride SA (K-DUR,KLOR-CON) 20 MEQ tablet TAKE 1 TABLET(20 MEQ) BY MOUTH 1 TIME 90 tablet 1  . vitamin B-12 (CYANOCOBALAMIN) 100 MCG tablet Take 100 mcg by mouth daily.    Marland Kitchen atorvastatin (LIPITOR) 10 MG tablet Take 0.5 tablets (5 mg total) by mouth daily at 6 PM. 45 tablet 1   No current facility-administered medications on file prior to visit.     Past Medical History:  Diagnosis Date  . Blood transfusion without reported diagnosis   . Chronic low back pain   . History of echocardiogram    Echo 5/18: EF 55-60, Gr 2 DD, mild MR, mild LAE, PASP 47 // Echo 1/18:  EF 55, mild AI, MAC, mild MR, mild LAE, trivial pericardial effusion  . History of ischemic left MCA stroke 11/2016   left MCA infarct status post mechanical thrombectomy complicated by large left basal ganglia hemorrhage with right shift.  Marland Kitchen HTN (hypertension)   . Hyperlipidemia   . Hypothyroidism   . Osteopenia   . Persistent atrial fibrillation    CHADS2-VASc=6 (female, age 68, HTN, CVA) // Apixaban // rate control strategy     Past Surgical History:  Procedure Laterality Date  . ABDOMINAL HYSTERECTOMY  1970  . CHOLECYSTECTOMY  07/2009   Dr. Rise Patience  . COLONOSCOPY  2003  . FLEXIBLE SIGMOIDOSCOPY  2010  . HAND SURGERY    . IR ANGIO INTRA EXTRACRAN SEL COM CAROTID INNOMINATE UNI L MOD SED  11/29/2016  . IR ANGIO VERTEBRAL SEL SUBCLAVIAN INNOMINATE UNI R MOD SED  11/29/2016  . IR PERCUTANEOUS ART THROMBECTOMY/INFUSION INTRACRANIAL INC DIAG ANGIO  11/29/2016  . IR RADIOLOGIST EVAL & MGMT  03/24/2017    . LUMBAR LAMINECTOMY  11/2008   Done by Dr. Patrice Paradise  . RADIOLOGY WITH ANESTHESIA N/A 11/29/2016   Procedure: RADIOLOGY WITH ANESTHESIA;  Surgeon: Radiologist, Medication, MD;  Location: [facility];  Service: Radiology;  Laterality: N/A;  . THYROIDECTOMY      Social History   Socioeconomic History  . Marital status: Married    Spouse name: Not on file  . Number of children: 1  . Years of education: Not on file  . Highest education level: Not on file  Occupational History  . Occupation: retired  Scientific laboratory technician  . Financial resource strain: Not very hard  . Food insecurity:    Worry: Never true    Inability: Never true  . Transportation needs:    Medical: No    Non-medical: No  Tobacco Use  .  Smoking status: Never Smoker  . Smokeless tobacco: Never Used  Substance and Sexual Activity  . Alcohol use: No  . Drug use: No  . Sexual activity: Not Currently  Lifestyle  . Physical activity:    Days per week: 0 days    Minutes per session: 0 min  . Stress: Not at all  Relationships  . Social connections:    Talks on phone: More than three times a week    Gets together: More than three times a week    Attends religious service: 1 to 4 times per year    Active member of club or organization: Yes    Attends meetings of clubs or organizations: 1 to 4 times per year    Relationship status: Married  Other Topics Concern  . Not on file  Social History Narrative   Married    Family History  Problem Relation Age of Onset  . Heart disease Father 29       MI age 66s  . Lung cancer Brother 51  . Colon cancer Neg Hx   . Esophageal cancer Neg Hx   . Rectal cancer Neg Hx   . Stomach cancer Neg Hx     Review of Systems  Constitutional: Negative for chills and fever.  Respiratory: Negative for cough, shortness of breath and wheezing.   Cardiovascular: Positive for leg swelling (sometimes). Negative for chest pain and palpitations.  Gastrointestinal: Negative for abdominal pain and  nausea.  Musculoskeletal: Positive for arthralgias and back pain.  Neurological: Negative for dizziness, light-headedness and headaches.  Psychiatric/Behavioral: Negative for dysphoric mood. The patient is not nervous/anxious.        Objective:   Vitals:   06/08/18 1341  BP: 128/68  Pulse: 73  Resp: 16  Temp: 97.9 F (36.6 C)  SpO2: 99%   BP Readings from Last 3 Encounters:  06/08/18 128/68  03/31/18 122/62  03/31/18 122/62   Wt Readings from Last 3 Encounters:  06/08/18 161 lb (73 kg)  03/31/18 162 lb (73.5 kg)  03/31/18 162 lb (73.5 kg)   Body mass index is 27.64 kg/m.   Physical Exam    Constitutional: Appears well-developed and well-nourished. No distress.  HENT:  Head: Normocephalic and atraumatic.  Neck: Neck supple. No tracheal deviation present. No thyromegaly present.  No cervical lymphadenopathy. 1 mm size hard nodules left anterior- lateral neck - non tender - ? Calcification vs cyst Cardiovascular: Normal rate, irregular rhythm and normal heart sounds.   2/6 systolic murmur heard. No carotid bruit .  No edema Pulmonary/Chest: Effort normal and breath sounds normal. No respiratory distress. No has no wheezes. No rales.  Skin: Skin is warm and dry. Not diaphoretic.  Psychiatric: Normal mood and affect. Behavior is normal.      Assessment & Plan:    See Problem List for Assessment and Plan of chronic medical problems.

## 2018-06-08 ENCOUNTER — Encounter: Payer: Self-pay | Admitting: Internal Medicine

## 2018-06-08 ENCOUNTER — Ambulatory Visit (INDEPENDENT_AMBULATORY_CARE_PROVIDER_SITE_OTHER): Payer: Medicare Other | Admitting: Internal Medicine

## 2018-06-08 ENCOUNTER — Other Ambulatory Visit (INDEPENDENT_AMBULATORY_CARE_PROVIDER_SITE_OTHER): Payer: Medicare Other

## 2018-06-08 VITALS — BP 128/68 | HR 73 | Temp 97.9°F | Resp 16 | Ht 64.0 in | Wt 161.0 lb

## 2018-06-08 DIAGNOSIS — E785 Hyperlipidemia, unspecified: Secondary | ICD-10-CM | POA: Diagnosis not present

## 2018-06-08 DIAGNOSIS — I4819 Other persistent atrial fibrillation: Secondary | ICD-10-CM

## 2018-06-08 DIAGNOSIS — N184 Chronic kidney disease, stage 4 (severe): Secondary | ICD-10-CM

## 2018-06-08 DIAGNOSIS — R7303 Prediabetes: Secondary | ICD-10-CM

## 2018-06-08 DIAGNOSIS — R221 Localized swelling, mass and lump, neck: Secondary | ICD-10-CM | POA: Insufficient documentation

## 2018-06-08 DIAGNOSIS — M545 Low back pain, unspecified: Secondary | ICD-10-CM

## 2018-06-08 DIAGNOSIS — G8929 Other chronic pain: Secondary | ICD-10-CM

## 2018-06-08 DIAGNOSIS — R600 Localized edema: Secondary | ICD-10-CM

## 2018-06-08 DIAGNOSIS — E039 Hypothyroidism, unspecified: Secondary | ICD-10-CM

## 2018-06-08 DIAGNOSIS — I5032 Chronic diastolic (congestive) heart failure: Secondary | ICD-10-CM

## 2018-06-08 DIAGNOSIS — I10 Essential (primary) hypertension: Secondary | ICD-10-CM

## 2018-06-08 DIAGNOSIS — F3289 Other specified depressive episodes: Secondary | ICD-10-CM

## 2018-06-08 DIAGNOSIS — I69320 Aphasia following cerebral infarction: Secondary | ICD-10-CM

## 2018-06-08 DIAGNOSIS — R1011 Right upper quadrant pain: Secondary | ICD-10-CM

## 2018-06-08 LAB — HEMOGLOBIN A1C: HEMOGLOBIN A1C: 5.7 % (ref 4.6–6.5)

## 2018-06-08 LAB — COMPREHENSIVE METABOLIC PANEL
ALK PHOS: 59 U/L (ref 39–117)
ALT: 12 U/L (ref 0–35)
AST: 16 U/L (ref 0–37)
Albumin: 4.3 g/dL (ref 3.5–5.2)
BILIRUBIN TOTAL: 0.3 mg/dL (ref 0.2–1.2)
BUN: 24 mg/dL — ABNORMAL HIGH (ref 6–23)
CO2: 26 mEq/L (ref 19–32)
Calcium: 9.7 mg/dL (ref 8.4–10.5)
Chloride: 104 mEq/L (ref 96–112)
Creatinine, Ser: 1.48 mg/dL — ABNORMAL HIGH (ref 0.40–1.20)
GFR: 36.18 mL/min — AB (ref >60.00)
GLUCOSE: 96 mg/dL (ref 70–99)
Potassium: 4.5 mEq/L (ref 3.5–5.1)
Sodium: 139 mEq/L (ref 135–145)
TOTAL PROTEIN: 7.2 g/dL (ref 6.0–8.3)

## 2018-06-08 LAB — CBC WITH DIFFERENTIAL/PLATELET
Basophils Absolute: 0 10*3/uL (ref 0.0–0.1)
Basophils Relative: 0.4 % (ref 0.0–3.0)
Eosinophils Absolute: 0.1 10*3/uL (ref 0.0–0.7)
Eosinophils Relative: 0.9 % (ref 0.0–5.0)
HCT: 38.2 % (ref 36.0–46.0)
HEMOGLOBIN: 13.3 g/dL (ref 12.0–15.0)
Lymphocytes Relative: 27 % (ref 12.0–46.0)
Lymphs Abs: 2.3 10*3/uL (ref 0.7–4.0)
MCHC: 34.9 g/dL (ref 30.0–36.0)
MCV: 94.5 fl (ref 78.0–100.0)
MONO ABS: 0.5 10*3/uL (ref 0.1–1.0)
Monocytes Relative: 6.3 % (ref 3.0–12.0)
NEUTROS PCT: 65.4 % (ref 43.0–77.0)
Neutro Abs: 5.6 10*3/uL (ref 1.4–7.7)
Platelets: 334 10*3/uL (ref 150.0–400.0)
RBC: 4.04 Mil/uL (ref 3.87–5.11)
RDW: 13.6 % (ref 11.5–15.5)
WBC: 8.6 10*3/uL (ref 4.0–10.5)

## 2018-06-08 LAB — LIPID PANEL
CHOLESTEROL: 182 mg/dL (ref 0–200)
HDL: 48.7 mg/dL (ref >39.00)
NonHDL: 133.2
TRIGLYCERIDES: 311 mg/dL — AB (ref 0.0–149.0)
Total CHOL/HDL Ratio: 4
VLDL: 62.2 mg/dL — ABNORMAL HIGH (ref 0.0–40.0)

## 2018-06-08 LAB — LDL CHOLESTEROL, DIRECT: LDL DIRECT: 82 mg/dL

## 2018-06-08 LAB — TSH: TSH: 0.78 u[IU]/mL (ref 0.35–4.50)

## 2018-06-08 NOTE — Assessment & Plan Note (Signed)
Clinically euthyroid Check tsh  Titrate med dose if needed  

## 2018-06-08 NOTE — Patient Instructions (Signed)
  Tests ordered today. Your results will be released to MyChart (or called to you) after review, usually within 72hours after test completion. If any changes need to be made, you will be notified at that same time.  Medications reviewed and updated.  Changes include :   none      Please followup in 6 months   

## 2018-06-08 NOTE — Assessment & Plan Note (Signed)
Controlled, stable Continue current dose of medication  

## 2018-06-08 NOTE — Assessment & Plan Note (Signed)
Rate controlled, asymptomatic Taking Eliquis, metoprolol Check CMP, CBC

## 2018-06-08 NOTE — Assessment & Plan Note (Signed)
Check a1c Low sugar / carb diet Stressed regular exercise   

## 2018-06-08 NOTE — Assessment & Plan Note (Signed)
Currently only taking Tylenol for her chronic pain Had seen Dr. Hardin Negus in the past, but he does not want to give anything stronger Overall pain seems to be controlled

## 2018-06-08 NOTE — Assessment & Plan Note (Signed)
Taking Eliquis, statin Blood pressure well controlled Exercising Check CBC, CMP, lipid panel

## 2018-06-08 NOTE — Assessment & Plan Note (Signed)
BP well controlled Current regimen effective and well tolerated Continue current medications at current doses cmp  

## 2018-06-08 NOTE — Assessment & Plan Note (Signed)
1 mm sized hard nodule looks anterior-lateral neck-nontender No other palpable lumps in neck, no lymphadenopathy Possible calcification versus cyst We will monitor for now

## 2018-06-08 NOTE — Assessment & Plan Note (Signed)
Check lipid panel  Continue daily statin Regular exercise and healthy diet encouraged  

## 2018-06-08 NOTE — Assessment & Plan Note (Addendum)
Following with nephrology CMP

## 2018-06-08 NOTE — Assessment & Plan Note (Signed)
Intermittent mild Controlled

## 2018-06-08 NOTE — Assessment & Plan Note (Signed)
Related to thoracic radiculopathy Taking nortriptyline Controlled Continue

## 2018-06-12 ENCOUNTER — Other Ambulatory Visit: Payer: Self-pay | Admitting: Internal Medicine

## 2018-06-23 ENCOUNTER — Other Ambulatory Visit: Payer: Self-pay | Admitting: Internal Medicine

## 2018-06-24 ENCOUNTER — Telehealth: Payer: Self-pay

## 2018-06-24 MED ORDER — ATORVASTATIN CALCIUM 10 MG PO TABS: ORAL_TABLET | ORAL | 1 refills | Status: DC

## 2018-06-24 NOTE — Telephone Encounter (Signed)
Medication changed and sent to pharmacy  

## 2018-06-24 NOTE — Telephone Encounter (Signed)
Copied from [place] [id number]. Topic: General - Other >> Jun 23, 2018  2:16 PM Carolyn Stare wrote:  Pt was told to take 1 tablet instead of a half tablet so she need a new RX sent in saying to take 1 tablet daily   atorvastatin (LIPITOR) 10 MG tablet   [facility]

## 2018-07-11 ENCOUNTER — Other Ambulatory Visit: Payer: Self-pay | Admitting: Internal Medicine

## 2018-08-17 ENCOUNTER — Ambulatory Visit: Payer: Self-pay

## 2018-08-17 NOTE — Telephone Encounter (Signed)
Patient's daughter called and says the health nurse is there and wants to know if the patient has CHF or A-Fib in her history. I advised she has both and gave her other history, she verbalized understanding.  Reason for Disposition . Health Information question, no triage required and triager able to answer question  Protocols used: INFORMATION ONLY CALL-A-AH

## 2018-09-12 ENCOUNTER — Other Ambulatory Visit: Payer: Self-pay | Admitting: Internal Medicine

## 2018-09-26 ENCOUNTER — Ambulatory Visit: Payer: Medicare Other | Admitting: Adult Health

## 2018-09-30 ENCOUNTER — Other Ambulatory Visit: Payer: Self-pay | Admitting: Internal Medicine

## 2018-10-04 ENCOUNTER — Ambulatory Visit: Payer: Medicare Other | Admitting: Adult Health

## 2018-10-04 ENCOUNTER — Ambulatory Visit: Payer: Medicare Other | Admitting: Internal Medicine

## 2018-10-17 ENCOUNTER — Other Ambulatory Visit: Payer: Self-pay | Admitting: *Deleted

## 2018-10-17 DIAGNOSIS — I10 Essential (primary) hypertension: Secondary | ICD-10-CM

## 2018-10-17 DIAGNOSIS — I4892 Unspecified atrial flutter: Secondary | ICD-10-CM

## 2018-10-17 DIAGNOSIS — F3289 Other specified depressive episodes: Secondary | ICD-10-CM

## 2018-10-17 DIAGNOSIS — I6389 Other cerebral infarction: Secondary | ICD-10-CM

## 2018-10-17 DIAGNOSIS — N184 Chronic kidney disease, stage 4 (severe): Secondary | ICD-10-CM

## 2018-10-17 DIAGNOSIS — I5032 Chronic diastolic (congestive) heart failure: Secondary | ICD-10-CM

## 2018-10-17 DIAGNOSIS — I4819 Other persistent atrial fibrillation: Secondary | ICD-10-CM

## 2018-10-17 DIAGNOSIS — M545 Low back pain, unspecified: Secondary | ICD-10-CM

## 2018-10-17 DIAGNOSIS — G8929 Other chronic pain: Secondary | ICD-10-CM

## 2018-10-17 NOTE — Patient Outreach (Signed)
  [facility]) Care Management Chronic Special Needs Program  10/17/2018  Name: MARCEIL WELP DOB: March 05, 1941  MRN: [id number]  Ms. Shilee Biggs is enrolled in a chronic special needs plan for  Heart Failure. A completed health risk assessment has not been received from the client and client has not responded to outreach attempts by their health care concierge.  The client's individualized care plan was developed based on available data.  Plan:  . Send unsuccessful outreach letter with a copy of individualized care plan to client . Send educational material to client on self management of blood pressure, atrial fibrillation and preventing a second stroke . Send Advance Directive packet to client . Refer client to [place] [facility] pharmacist related to polypharmacy . Send individualized care plan to provider   Chronic care management coordinator, Peter Garter,  will attempt outreach in 2-4 months.   Barrington Ellison RN,CCM,CDE [place] Management Coordinator Office Phone [phone] Office Fax [phone]

## 2018-10-20 ENCOUNTER — Other Ambulatory Visit: Payer: Self-pay | Admitting: Pharmacist

## 2018-10-20 NOTE — Patient Outreach (Signed)
[facility]) Care Management  [place]   10/20/2018  Valerie Mcclure Aug 24, 1940 [id number]  Reason for referral: Medication Review  Referral source: [place] [facility] RN with [employer] Current insurance: [employer]  Outreach:  Successful telephone call with [name], patient's daughter.  HIPAA identifiers verified.   [name] states she takes care of her mother's medications and is the point-person to talk with for medication related questions.  I explained purpose of call and offered to review medications with [facility].  Melanie declined [place] services at this time and states patient has no concerns or issues with medications at this time.  She is aware she will continue to receive calls from [facility] [facility] team.    Plan: . Will close [facility] pharmacy case at this time as daughter declined services.  Am happy to assist in the future as needed.   Ralene Bathe, PharmD, [place] [phone]

## 2018-11-03 ENCOUNTER — Other Ambulatory Visit: Payer: Self-pay | Admitting: Internal Medicine

## 2018-11-30 ENCOUNTER — Ambulatory Visit: Payer: Self-pay

## 2018-12-07 NOTE — Progress Notes (Signed)
Virtual Visit via Telephone Note  I connected with Valerie Mcclure on 12/08/18 at 11:00 AM EDT by a video enabled telemedicine application and verified that I am speaking with the correct person using two identifiers.   I discussed the limitations of evaluation and management by telemedicine and the availability of in person appointments. The patient expressed understanding and agreed to proceed.  The patient is currently at home and I am in the office.    No referring provider.     History of Present Illness: She is here for follow up of her chronic medical conditions.   She is exercising - PT exercises.    Afib, CAD, Hypertension: She is taking her medication daily. She is compliant with a low sodium diet most of the time.  She will have occasional, mild swelling in the legs if she does consume extra sodium.  She denies chest pain, palpitations, shortness of breath and regular headaches. She does monitor her blood pressure at home and it has been well controlled.    Hyperlipidemia: She is taking her medication daily. She is compliant with a low fat/cholesterol diet. She denies myalgias out of the ordinary.   Hypothyroidism:  She is taking her medication daily.  She denies any recent changes in energy or weight that are unexplained.  Overall she feels well.  CKD:  She follows with nephrology.  Her last appointment was canceled because of the COVID-19 situation.  She is waiting to get call to have this rescheduled.  She is trying to drink plenty of fluids.  She does not take any NSAIDs.  H/o CVA with dysphasia and right sided weakness:  She does her exercises she learned from physical therapy.  She is taking her Eliquis, statin and blood pressure medications daily.    Depression: She is taking her medication daily as prescribed. She denies any side effects from the medication. She feels her depression is well controlled and she is happy with her current dose of medication.    Prediabetes:  She is compliant with a low sugar/carbohydrate diet.  She is exercising.  Chronic RUQ pain from thoracic neuropathy:  She takes nortriptyline nightly.  Her pain is controlled.     Review of Systems  Constitutional: Negative for chills and fever.  Respiratory: Negative for cough, shortness of breath and wheezing.   Cardiovascular: Positive for leg swelling (intermittent, mild). Negative for chest pain and palpitations.  Neurological: Negative for dizziness and headaches.  Psychiatric/Behavioral: Negative for depression. The patient is not nervous/anxious.       Social History   Socioeconomic History  . Marital status: Married    Spouse name: Not on file  . Number of children: 1  . Years of education: Not on file  . Highest education level: Not on file  Occupational History  . Occupation: retired  Scientific laboratory technician  . Financial resource strain: Not very hard  . Food insecurity:    Worry: Never true    Inability: Never true  . Transportation needs:    Medical: No    Non-medical: No  Tobacco Use  . Smoking status: Never Smoker  . Smokeless tobacco: Never Used  Substance and Sexual Activity  . Alcohol use: No  . Drug use: No  . Sexual activity: Not Currently  Lifestyle  . Physical activity:    Days per week: 0 days    Minutes per session: 0 min  . Stress: Not at all  Relationships  . Social connections:  Talks on phone: More than three times a week    Gets together: More than three times a week    Attends religious service: 1 to 4 times per year    Active member of club or organization: Yes    Attends meetings of clubs or organizations: 1 to 4 times per year    Relationship status: Married  Other Topics Concern  . Not on file  Social History Narrative   Married     Observations/Objective:   BP Readings from Last 3 Encounters:  06/08/18 128/68  03/31/18 122/62  03/31/18 122/62   BP running about the same at home.    Lab Results  Component  Value Date   HGBA1C 5.7 06/08/2018     Assessment and Plan:  See Problem List for Assessment and Plan of chronic medical problems.   Follow Up Instructions:    I discussed the assessment and treatment plan with the patient. The patient was provided an opportunity to ask questions and all were answered. The patient agreed with the plan and demonstrated an understanding of the instructions.   The patient was advised to call back or seek an in-person evaluation if the symptoms worsen or if the condition fails to improve as anticipated.  FU in 6 months,   Needs blood work over the summer - by me or nephrology.  We will hold off on blood work at this time given that her blood work is all been stable and in order to avoid her needed to the office.  [name], MD

## 2018-12-08 ENCOUNTER — Encounter: Payer: Self-pay | Admitting: Internal Medicine

## 2018-12-08 ENCOUNTER — Ambulatory Visit (INDEPENDENT_AMBULATORY_CARE_PROVIDER_SITE_OTHER): Payer: HMO | Admitting: Internal Medicine

## 2018-12-08 DIAGNOSIS — N184 Chronic kidney disease, stage 4 (severe): Secondary | ICD-10-CM | POA: Diagnosis not present

## 2018-12-08 DIAGNOSIS — I4819 Other persistent atrial fibrillation: Secondary | ICD-10-CM | POA: Diagnosis not present

## 2018-12-08 DIAGNOSIS — I10 Essential (primary) hypertension: Secondary | ICD-10-CM | POA: Diagnosis not present

## 2018-12-08 DIAGNOSIS — F3289 Other specified depressive episodes: Secondary | ICD-10-CM | POA: Diagnosis not present

## 2018-12-08 DIAGNOSIS — E785 Hyperlipidemia, unspecified: Secondary | ICD-10-CM

## 2018-12-08 DIAGNOSIS — R7303 Prediabetes: Secondary | ICD-10-CM | POA: Diagnosis not present

## 2018-12-08 DIAGNOSIS — R1011 Right upper quadrant pain: Secondary | ICD-10-CM

## 2018-12-08 DIAGNOSIS — I69320 Aphasia following cerebral infarction: Secondary | ICD-10-CM | POA: Diagnosis not present

## 2018-12-08 DIAGNOSIS — E039 Hypothyroidism, unspecified: Secondary | ICD-10-CM

## 2018-12-08 DIAGNOSIS — G8929 Other chronic pain: Secondary | ICD-10-CM | POA: Diagnosis not present

## 2018-12-08 MED ORDER — ATORVASTATIN CALCIUM 10 MG PO TABS: ORAL_TABLET | ORAL | 1 refills | Status: DC

## 2018-12-09 ENCOUNTER — Ambulatory Visit: Payer: Self-pay

## 2018-12-16 ENCOUNTER — Other Ambulatory Visit: Payer: Self-pay | Admitting: Internal Medicine

## 2018-12-30 ENCOUNTER — Other Ambulatory Visit: Payer: Self-pay

## 2018-12-30 NOTE — Patient Outreach (Addendum)
[facility]) Care Management Chronic Special Needs Program  12/30/2018  Name: Valerie Mcclure DOB: April 15, 1941  MRN: [id number]  [name] is enrolled in a chronic special needs plan for Heart Failure. Chronic Care Management Coordinator telephoned client to complete health risk assessment and to develop individualized care plan.  Introduced the chronic care management program, importance of client participation, and taking their care plan to all provider appointments and inpatient facilities.  Reviewed the transition of care process and possible referral to community care management. Spoke with daughter [name] who is on [employer]  consent  Subjective: States that client does not need assistance with her ADL/IADL's other than her speech is effected so she or her Father help her as needed.  States client does get a little SOB if she walks very much.  States she does weight daily but does not write it down as her weight is stable.  States they know to call MD if weight should go up or if she has more swelling in her legs.  States her ankles will become swollen but it goes down when she elevates her legs.  States she has had 2 fall in the last few months where she tripped on a blanket and the other she lost her balance getting up from the kitchen table.  States she has a cane but does not like to use in the house.  States her B/P is good.  States she follows a low salt diet.  Goals Addressed            This Visit's Progress   . COMPLETED:  Acknowledge receipt of Advanced Directive package   On track    Received forms    . Advanced Care Planning complete by next 6 months      . Client understands the importance of follow-up with providers by attending scheduled visits   On track   . Client will not report change from baseline and no repeated symptoms of stroke with in the next 4 months    On track   . Client will report no fall or injuries in  the next 4 months.   Not on track    2 falls     . Client will report no worsening of symptoms of Atrial Fibrillation within the next 4 months   On track   . Client will verbalize knowledge of self management of Hypertension as evidences by BP reading of 140/90 or less; or as defined by provider   On track   . Obtain annual  Lipid Profile, LDL-C   On track   . Patient Stated   On track    Maintain current health status.    . Visit Primary Care Provider or Cardiologist at least 2 times per year   On track    Reports weight as stable and verbalizes s/s to call MD.  Client has completed medication review with pharmacy and she has no current pharmacy needs.  Client received Advanced Directives packet and plans to complete soon.  Declines referral to social work to assist with Advanced Directives.  Reports adherence to daily weights and low sodium diet Encouraged to write weights down Instructed to remind client to use her cane in the house and to get up slowly Encouraged to continue to do the exercises from PT daily  Plan:  Send successful outreach letter with a copy of their individualized care plan, Send individual care plan to provider and Send educational  material EMMI: Preventing Falls, Check for Safety brouchure   Chronic care management coordination will outreach in:  4-5 Months    Peter Garter RN, [facility], [place] Management [phone]

## 2019-02-02 ENCOUNTER — Other Ambulatory Visit: Payer: Self-pay | Admitting: Internal Medicine

## 2019-03-01 ENCOUNTER — Other Ambulatory Visit: Payer: Self-pay | Admitting: Internal Medicine

## 2019-03-08 ENCOUNTER — Other Ambulatory Visit: Payer: Self-pay | Admitting: Internal Medicine

## 2019-03-13 ENCOUNTER — Other Ambulatory Visit: Payer: Self-pay | Admitting: Internal Medicine

## 2019-03-14 ENCOUNTER — Ambulatory Visit: Payer: HMO | Admitting: Physician Assistant

## 2019-04-05 ENCOUNTER — Other Ambulatory Visit: Payer: Self-pay | Admitting: Internal Medicine

## 2019-04-24 ENCOUNTER — Other Ambulatory Visit: Payer: Self-pay

## 2019-04-24 NOTE — Telephone Encounter (Signed)

## 2019-04-25 ENCOUNTER — Encounter: Payer: Self-pay | Admitting: Physician Assistant

## 2019-04-25 ENCOUNTER — Telehealth (INDEPENDENT_AMBULATORY_CARE_PROVIDER_SITE_OTHER): Payer: HMO | Admitting: Physician Assistant

## 2019-04-25 ENCOUNTER — Other Ambulatory Visit: Payer: Self-pay

## 2019-04-25 VITALS — BP 161/72 | HR 63 | Ht 64.0 in | Wt 160.0 lb

## 2019-04-25 DIAGNOSIS — I69359 Hemiplegia and hemiparesis following cerebral infarction affecting unspecified side: Secondary | ICD-10-CM

## 2019-04-25 DIAGNOSIS — I10 Essential (primary) hypertension: Secondary | ICD-10-CM

## 2019-04-25 DIAGNOSIS — N1832 Chronic kidney disease, stage 3b: Secondary | ICD-10-CM

## 2019-04-25 DIAGNOSIS — I4819 Other persistent atrial fibrillation: Secondary | ICD-10-CM | POA: Diagnosis not present

## 2019-04-25 NOTE — Progress Notes (Signed)
Virtual Visit via Telephone Note   This visit type was conducted due to national recommendations for restrictions regarding the COVID-19 Pandemic (e.g. social distancing) in an effort to limit this patient's exposure and mitigate transmission in our community.  Due to her co-morbid illnesses, this patient is at least at moderate risk for complications without adequate follow up.  This format is felt to be most appropriate for this patient at this time.  The patient did not have access to video technology/had technical difficulties with video requiring transitioning to audio format only (telephone).  All issues noted in this document were discussed and addressed.  No physical exam could be performed with this format.  Please refer to the patient's chart for her  consent to telehealth for [facility].   Date:  04/25/2019   ID:  Valerie Mcclure, DOB [birth year], MRN [id number]  Patient Location: Home Provider Location: Home  PCP:  [name], MD  Cardiologist:  Mertie Moores, MD   Electrophysiologist:  None   Evaluation Performed:  Follow-Up Visit  Chief Complaint: Atrial fibrillation  History of Present Illness:    Valerie Mcclure is a 78 y.o. female with:  Persistent Atrial fibrillation  Admx with Opiate overdose 12/2016 >> AFluter noted at that time   CHADS2-VASc=6 (female, age 2, HTN, CVA) >> Eliquis   Hypertension   Glucose intolerance  Hyperlipidemia   Chronic kidney disease   Hx of CVA in 2018  S/p L MCA infarct s/p mechanical thrombectomy, c/b large hemorrhage  Hypothyroidism   Chronic leg edema  She was last seen in 08/2017.  Today, her daughter helps with the history due to the patient's expressive aphasia.  She has not had chest pain, significant shortness of breath, syncope, orthopnea, bleeding issues.  She has not had palpitations.  She has occasional leg swelling that improves with leg elevation.  The patient does not have symptoms concerning  for COVID-19 infection (fever, chills, cough, or new shortness of breath).    Past Medical History:  Diagnosis Date  . Blood transfusion without reported diagnosis   . Chronic low back pain   . History of echocardiogram    Echo 5/18: EF 55-60, Gr 2 DD, mild MR, mild LAE, PASP 47 // Echo 1/18:  EF 55, mild AI, MAC, mild MR, mild LAE, trivial pericardial effusion  . History of ischemic left MCA stroke 11/2016   left MCA infarct status post mechanical thrombectomy complicated by large left basal ganglia hemorrhage with right shift.  Marland Kitchen HTN (hypertension)   . Hyperlipidemia   . Hypothyroidism   . Osteopenia   . Persistent atrial fibrillation ([name])    CHADS2-VASc=6 (female, age 22, HTN, CVA) // Apixaban // rate control strategy    Past Surgical History:  Procedure Laterality Date  . ABDOMINAL HYSTERECTOMY  1970  . CHOLECYSTECTOMY  07/2009   Dr. Rise Patience  . COLONOSCOPY  2003  . FLEXIBLE SIGMOIDOSCOPY  2010  . HAND SURGERY    . IR ANGIO INTRA EXTRACRAN SEL COM CAROTID INNOMINATE UNI L MOD SED  11/29/2016  . IR ANGIO VERTEBRAL SEL SUBCLAVIAN INNOMINATE UNI R MOD SED  11/29/2016  . IR PERCUTANEOUS ART THROMBECTOMY/INFUSION INTRACRANIAL INC DIAG ANGIO  11/29/2016  . IR RADIOLOGIST EVAL & MGMT  03/24/2017  . LUMBAR LAMINECTOMY  11/2008   Done by Dr. Patrice Paradise  . RADIOLOGY WITH ANESTHESIA N/A 11/29/2016   Procedure: RADIOLOGY WITH ANESTHESIA;  Surgeon: Radiologist, Medication, MD;  Location: [facility];  Service: Radiology;  Laterality:  N/A;  . THYROIDECTOMY       Current Meds  Medication Sig  . amLODipine (NORVASC) 10 MG tablet TAKE 1/2 TABLET BY MOUTH DAILY  . atorvastatin (LIPITOR) 10 MG tablet Take 1 tablet (10 mg total) by mouth daily.  . Calcium Carbonate-Vit D-Min (CALCIUM 1200 PO) Take 1 tablet by mouth daily.   . Cholecalciferol (VITAMIN D3) 1000 UNITS CAPS Take 1,000 Units by mouth daily.   Marland Kitchen ELIQUIS 5 MG TABS tablet TAKE 1 TABLET(5 MG) BY MOUTH TWICE DAILY  . FLUoxetine (PROZAC) 40 MG  capsule TAKE 1 CAPSULE(40 MG) BY MOUTH DAILY  . hydrALAZINE (APRESOLINE) 25 MG tablet Take 1 tablet (25 mg total) by mouth 3 (three) times daily.  Marland Kitchen levothyroxine (SYNTHROID) 75 MCG tablet TAKE 1 TABLET(75 MCG) BY MOUTH DAILY  . metoprolol tartrate (LOPRESSOR) 25 MG tablet TAKE 1 TABLET(25 MG) BY MOUTH TWICE DAILY  . nortriptyline (PAMELOR) 10 MG capsule TAKE 4 CAPSULES(40 MG) BY MOUTH AT BEDTIME  . polycarbophil (FIBERCON) 625 MG tablet Take 2 tablets (1,250 mg total) by mouth daily.  . potassium chloride SA (K-DUR) 20 MEQ tablet TAKE 1 TABLET(20 MEQ) BY MOUTH 1 TIME DAILY  . vitamin B-12 (CYANOCOBALAMIN) 100 MCG tablet Take 100 mcg by mouth daily.     Allergies:   Shrimp [shellfish allergy], Valsartan, Tandem plus [fefum-fepo-fa-b cmp-c-zn-mn-cu], Ivp dye [iodinated diagnostic agents], and Penicillins   Social History   Tobacco Use  . Smoking status: Never Smoker  . Smokeless tobacco: Never Used  Substance Use Topics  . Alcohol use: No  . Drug use: No     Family Hx: The patient's family history includes Heart disease (age of onset: 30) in her father; Lung cancer (age of onset: 22) in her brother. There is no history of Colon cancer, Esophageal cancer, Rectal cancer, or Stomach cancer.  ROS:   Please see the history of present illness.    All other systems reviewed and are negative.   Prior CV studies:   The following studies were reviewed today:  Echo 12/02/16 EF 55-60, Gr 2 DD, mild MR, mild LAE, PASP 47  Echo 08/19/16 EF 55, mild AI, MAC, mild MR, mild LAE, trivial pericardial effusion  Labs/Other Tests and Data Reviewed:    EKG:  No ECG reviewed.  Recent Labs: 06/08/2018: ALT 12; BUN 24; Creatinine, Ser 1.48; Hemoglobin 13.3; Platelets 334.0; Potassium 4.5; Sodium 139; TSH 0.78   Recent Lipid Panel Lab Results  Component Value Date/Time   CHOL 182 06/08/2018 02:17 PM   TRIG 311.0 (H) 06/08/2018 02:17 PM   HDL 48.70 06/08/2018 02:17 PM   CHOLHDL 4 06/08/2018  02:17 PM   LDLCALC 72 03/31/2018 11:54 AM   LDLDIRECT 82.0 06/08/2018 02:17 PM    Wt Readings from Last 3 Encounters:  04/25/19 160 lb (72.6 kg)  06/08/18 161 lb (73 kg)  03/31/18 162 lb (73.5 kg)     Objective:    Vital Signs:  BP (!) 161/72   Pulse 63   Ht _0  (1.626 m)   Wt 160 lb (72.6 kg)   BMI 27.46 kg/m    VITAL SIGNS:  reviewed  ASSESSMENT & PLAN:    1. Persistent atrial fibrillation ([name]) Rate control strategy.  She is tolerating anticoagulation.  She is currently on the appropriate dose of Apixaban.  I did tell her daughter that when she turns 25, if her creatinine continues to increase, we would likely need to change her Apixaban to 2.5 mg twice daily.  For now, she will remain on 5 mg twice daily.  She has follow-up with her PCP in November.  I will see if she can have a BMET, CBC obtained at that visit for follow-up.  She can see me or Dr. Acie Fredrickson in 1 year.  2. Stage 3b chronic kidney disease Continue follow-up with nephrology.  3. Essential hypertension Blood pressure elevated today.  Previously, her blood pressure has been well controlled.  I asked her daughter to continue to monitor blood pressure and notify us if her readings are persistently >130/80.  4. Hemiparesis due to old stroke [facility]) As noted, her daughter helps with the history today given the patient's expressive aphasia.  She does note continued weakness on the right side but is able to ambulate with a cane.   Time:   Today, I have spent 12 minutes with the patient with telehealth technology discussing the above problems.     Medication Adjustments/Labs and Tests Ordered: Current medicines are reviewed at length with the patient today.  Concerns regarding medicines are outlined above.   Tests Ordered: No orders of the defined types were placed in this encounter.   Medication Changes: No orders of the defined types were placed in this encounter.   Follow Up:  In Person in 1 year(s)   Signed, Richardson Dopp, PA-C  04/25/2019 3:39 PM    [place]

## 2019-04-25 NOTE — Patient Instructions (Signed)
Medication Instructions:  Your physician recommends that you continue on your current medications as directed. Please refer to the Current Medication list given to you today.  If you need a refill on your cardiac medications before your next appointment, please call your pharmacy.   Lab work: BMET & CBC to be done by your PCP  If you have labs (blood work) drawn today and your tests are completely normal, you will receive your results only by: Marland Kitchen MyChart Message (if you have MyChart) OR . A paper copy in the mail If you have any lab test that is abnormal or we need to change your treatment, we will call you to review the results.  Testing/Procedures: None   Follow-Up: At [facility], you and your health needs are our priority.  As part of our continuing mission to provide you with exceptional heart care, we have created designated Provider Care Teams.  These Care Teams include your primary Cardiologist (physician) and Advanced Practice Providers (APPs -  Physician Assistants and Nurse Practitioners) who all work together to provide you with the care you need, when you need it. You will need a follow up appointment in:  1 years.  Please call our office 2 months in advance to schedule this appointment.  You may see Mertie Moores, MD or one of the following Advanced Practice Providers on your designated Care Team: Richardson Dopp, PA-C  Any Other Special Instructions Will Be Listed Below (If Applicable).

## 2019-05-03 ENCOUNTER — Other Ambulatory Visit: Payer: Self-pay

## 2019-05-03 NOTE — Patient Outreach (Signed)
  [facility]) Care Management Chronic Special Needs Program  05/03/2019  Name: Valerie Mcclure DOB: 03-23-1941  MRN: [id number]  Ms. Nema Oatley is enrolled in a chronic special needs plan for Heart Failure. Reviewed and updated care plan. Spoke with  daughter [name] Designated Party Release, HIPAA verified due to clients expressive aphasia Subjective: States that client has been doing good.  States she weights every day and her weight is stable.  Denies any SOB or chest pains. States she is following a low salt diet States she has had 2 falls in the last 3-4 months.  States client forgot to use her cane.  States her blood pressures have been good.    Goals Addressed            This Visit's Progress   . Advanced Care Planning complete by next 6 months(continue 05/03/19)   On track   . Client understands the importance of follow-up with providers by attending scheduled visits   On track   . Client will not report change from baseline and no repeated symptoms of stroke with in the next 6 months(continue 05/03/19)   On track   . Client will report no fall or injuries in the next 6 months(continue 05/03/19)   Not on track    2 falls     . Client will report no worsening of symptoms of Atrial Fibrillation within the next 55months(continue 05/03/19)   On track   . Client will verbalize knowledge of self management of Hypertension as evidences by BP reading of 140/90 or less; or as defined by provider   On track   . Maintain timely refills of Heart Failure medication as prescribed within the year    On track   . Obtain annual  Lipid Profile, LDL-C   On track   . Patient Stated   On track    Maintain current health status.    . COMPLETED: Visit Primary Care Provider or Cardiologist at least 2 times per year       Visits on 12/08/18, 04/25/19     Reports stable weights and no s/s of HF.  Client to see primary care provider on 06/13/19 Client has Advanced  Directives forms but has not completed Reinforced to weigh daily and to try to write down weight Reviewed s/s of exacerbation of HF to call doctor Reinforce to remind client to use her cane at all times and to get up slowly Reviewed number for 24-hour nurse Line Reviewed COVID 19 precautions  Plan:  Send successful outreach letter with a copy of their individualized care plan and Send individual care plan to provider  Chronic care management coordinator will outreach in:  4-6 Months     [place], [facility], [place] Management Coordinator [place] Management [phone]

## 2019-05-24 ENCOUNTER — Other Ambulatory Visit: Payer: Self-pay | Admitting: Internal Medicine

## 2019-05-24 ENCOUNTER — Other Ambulatory Visit: Payer: Self-pay

## 2019-05-24 MED ORDER — AMLODIPINE BESYLATE 10 MG PO TABS: 5.0000 mg | ORAL_TABLET | Freq: Every day | ORAL | 0 refills | Status: DC

## 2019-05-29 ENCOUNTER — Telehealth: Payer: Self-pay

## 2019-05-29 NOTE — Telephone Encounter (Signed)
If she is having multiple extractions will need to hold for 48 hr prior and start as soon as possible after procedure.  If she is only have one extraction stop 24 hrs prior.

## 2019-05-29 NOTE — Telephone Encounter (Signed)
LVM for dentist office to call back in regards.

## 2019-05-29 NOTE — Telephone Encounter (Signed)
Direction written and faxed over per request below.

## 2019-05-29 NOTE — Telephone Encounter (Signed)
Copied from [place] #[id number]. Topic: General - Other >> May 29, 2019  9:57 AM Rainey Pines A wrote: Dentist Office is requesting a callback in regards to clearance for patients extractions and patient taking eloquis. Please advise. Contact [phone]

## 2019-05-29 NOTE — Telephone Encounter (Signed)
Caller name: Janett Billow  Relation to pt: Lanae Crumbly DDS Call back number: [phone] fax # [phone]   Reason for call:  Requesting PCP instructions please fax

## 2019-06-08 ENCOUNTER — Other Ambulatory Visit: Payer: Self-pay | Admitting: Internal Medicine

## 2019-06-12 NOTE — Patient Instructions (Addendum)
Call [facility] to set up an appointment:  Phone: [phone]    Tests ordered today. Your results will be released to [place] (or called to you) after review.  If any changes need to be made, you will be notified at that same time.   Medications reviewed and updated.  Changes include :   Atorvastatin increased to 20 mg daily  Your prescription(s) have been submitted to your pharmacy. Please take as directed and contact our office if you believe you are having problem(s) with the medication(s).  An MRI and home PT, speech therapy have been ordered.   Please followup in 6 months

## 2019-06-12 NOTE — Progress Notes (Signed)
Subjective:    Patient ID: Valerie Mcclure, female    DOB: [birth year], 78 y.o.   MRN: [id number]  HPI The patient is here for follow up.  Her daughter is here with her.  She is not exercising regularly.    She has had increased falls recently.  She fell yesterday.  She is having a hard time describing what happens with the falls.  One time she was lightheaded, but the other times she just seems to fall.  She has had more slurred speech and dysphasia.  Her daughter is concerned she may have had another stroke.  She does take her medication consistently.  Persistent Afib, CAD, Hypertension: She is taking her medication daily. She is compliant with a low sodium diet.  She denies chest pain, shortness of breath and regular headaches. She does monitor her blood pressure at home - well controlled.    Hyperlipidemia: She is taking her medication daily. She is compliant with a low fat/cholesterol diet. She denies myalgias.   Hypothyroidism:  She is taking her medication daily.  She denies any recent changes in energy or weight that are unexplained.   CKD:  She follows with nephrology.     H/o CVA with dysphasia and right sided weakness:   She is taking all her medications daily.  She denies missing any of her Eliquis doses.  Depression: She is taking her medication daily as prescribed. She denies any side effects from the medication. She feels her depression is well controlled and she is happy with her current dose of medication.   Prediabetes:  She is compliant with a low sugar/carbohydrate diet.  She is not exercising regularly.  Chronic RUQ pain from thoracic neuropathy:  She takes nortriptyline nightly.  Her pain is controlled.     Medications and allergies reviewed with patient and updated if appropriate.  Patient Active Problem List   Diagnosis Date Noted  . Lump in neck 06/08/2018  . Sleep difficulties 03/31/2018  . Persistent atrial fibrillation ([place]) 09/06/2017  . TIA  (transient ischemic attack) 07/12/2017  . Bradycardia 04/12/2017  . Depression 01/29/2017  . Fall   . Aphasia as late effect of cerebrovascular accident (CVA)   . Chronic diastolic heart failure ([place])   . Dysarthria, post-stroke   . Atrial flutter ([place]) 12/22/2016  . Spastic hemiplegia of right dominant side as late effect of cerebral infarction ([place])   . Anemia of chronic disease   . Essential hypertension   . Dysphagia, post-stroke   . Anterior cerebral circulation hemorrhagic infarction ([place]) 12/03/2016  . Right hemiparesis ([place])   . Nontraumatic subcortical hemorrhage of left cerebral hemisphere ([place])   . Mild aortic regurgitation 08/20/2016  . Bilateral edema of lower extremity 04/07/2016  . Pancreatic duct dilated 01/09/2016  . Stage 4 chronic kidney disease ([place]) 12/25/2015  . Mild tricuspid regurgitation 12/25/2015  . Mild mitral regurgitation 12/25/2015  . Prediabetes 06/10/2015  . Abdominal pain, chronic, right upper quadrant 06/04/2015  . Chronic low back pain   . PAIN IN THORACIC SPINE 04/18/2010  . Coronary atherosclerosis 01/24/2009  . DEGENERATIVE JOINT DISEASE 01/23/2009  . ARTHRITIS, LEFT KNEE 12/13/2008  . HERNIATED LUMBAR DISC 12/13/2008  . Hypothyroidism 11/21/2007  . Hyperlipidemia 11/09/2007  . Osteopenia 11/09/2007    Current Outpatient Medications on File Prior to Visit  Medication Sig Dispense Refill  . amLODipine (NORVASC) 10 MG tablet TAKE 1/2 TABLET(5 MG) BY MOUTH DAILY 45 tablet 0  . atorvastatin (LIPITOR) 10 MG tablet  Take 1 tablet (10 mg total) by mouth daily. 90 tablet 1  . Calcium Carbonate-Vit D-Min (CALCIUM 1200 PO) Take 1 tablet by mouth daily.     . Cholecalciferol (VITAMIN D3) 1000 UNITS CAPS Take 1,000 Units by mouth daily.     Marland Kitchen ELIQUIS 5 MG TABS tablet TAKE 1 TABLET(5 MG) BY MOUTH TWICE DAILY 180 tablet 1  . FLUoxetine (PROZAC) 40 MG capsule TAKE 1 CAPSULE(40 MG) BY MOUTH DAILY 90 capsule 1  . hydrALAZINE (APRESOLINE) 25 MG tablet  Take 1 tablet (25 mg total) by mouth 3 (three) times daily. 270 tablet 1  . levothyroxine (SYNTHROID) 75 MCG tablet TAKE 1 TABLET(75 MCG) BY MOUTH DAILY 90 tablet 1  . metoprolol tartrate (LOPRESSOR) 25 MG tablet TAKE 1 TABLET(25 MG) BY MOUTH TWICE DAILY 180 tablet 1  . nortriptyline (PAMELOR) 10 MG capsule TAKE 4 CAPSULES(40 MG) BY MOUTH AT BEDTIME 360 capsule 0  . polycarbophil (FIBERCON) 625 MG tablet Take 2 tablets (1,250 mg total) by mouth daily. 60 tablet 0  . potassium chloride SA (K-DUR) 20 MEQ tablet TAKE 1 TABLET(20 MEQ) BY MOUTH 1 TIME DAILY 90 tablet 1  . vitamin B-12 (CYANOCOBALAMIN) 100 MCG tablet Take 100 mcg by mouth daily.     No current facility-administered medications on file prior to visit.     Past Medical History:  Diagnosis Date  . Blood transfusion without reported diagnosis   . Chronic low back pain   . History of echocardiogram    Echo 5/18: EF 55-60, Gr 2 DD, mild MR, mild LAE, PASP 47 // Echo 1/18:  EF 55, mild AI, MAC, mild MR, mild LAE, trivial pericardial effusion  . History of ischemic left MCA stroke 11/2016   left MCA infarct status post mechanical thrombectomy complicated by large left basal ganglia hemorrhage with right shift.  Marland Kitchen HTN (hypertension)   . Hyperlipidemia   . Hypothyroidism   . Osteopenia   . Persistent atrial fibrillation ([name])    CHADS2-VASc=6 (female, age 43, HTN, CVA) // Apixaban // rate control strategy     Past Surgical History:  Procedure Laterality Date  . ABDOMINAL HYSTERECTOMY  1970  . CHOLECYSTECTOMY  07/2009   Dr. Rise Patience  . COLONOSCOPY  2003  . FLEXIBLE SIGMOIDOSCOPY  2010  . HAND SURGERY    . IR ANGIO INTRA EXTRACRAN SEL COM CAROTID INNOMINATE UNI L MOD SED  11/29/2016  . IR ANGIO VERTEBRAL SEL SUBCLAVIAN INNOMINATE UNI R MOD SED  11/29/2016  . IR PERCUTANEOUS ART THROMBECTOMY/INFUSION INTRACRANIAL INC DIAG ANGIO  11/29/2016  . IR RADIOLOGIST EVAL & MGMT  03/24/2017  . LUMBAR LAMINECTOMY  11/2008   Done by Dr. Patrice Paradise  .  RADIOLOGY WITH ANESTHESIA N/A 11/29/2016   Procedure: RADIOLOGY WITH ANESTHESIA;  Surgeon: Radiologist, Medication, MD;  Location: [facility];  Service: Radiology;  Laterality: N/A;  . THYROIDECTOMY      Social History   Socioeconomic History  . Marital status: Married    Spouse name: Not on file  . Number of children: 1  . Years of education: Not on file  . Highest education level: Not on file  Occupational History  . Occupation: retired  Scientific laboratory technician  . Financial resource strain: Not very hard  . Food insecurity    Worry: Never true    Inability: Never true  . Transportation needs    Medical: No    Non-medical: No  Tobacco Use  . Smoking status: Never Smoker  . Smokeless tobacco: Never Used  Substance and Sexual Activity  . Alcohol use: No  . Drug use: No  . Sexual activity: Not Currently  Lifestyle  . Physical activity    Days per week: 0 days    Minutes per session: 0 min  . Stress: Not at all  Relationships  . Social connections    Talks on phone: More than three times a week    Gets together: More than three times a week    Attends religious service: 1 to 4 times per year    Active member of club or organization: Yes    Attends meetings of clubs or organizations: 1 to 4 times per year    Relationship status: Married  Other Topics Concern  . Not on file  Social History Narrative   Married    Family History  Problem Relation Age of Onset  . Heart disease Father 101       MI age 41s  . Lung cancer Brother 14  . Colon cancer Neg Hx   . Esophageal cancer Neg Hx   . Rectal cancer Neg Hx   . Stomach cancer Neg Hx     Review of Systems  Constitutional: Negative for chills and fever.  Respiratory: Negative for cough, shortness of breath and wheezing.   Cardiovascular: Positive for palpitations (not daily, up to 3/day) and leg swelling. Negative for chest pain.  Neurological: Positive for speech difficulty, weakness (right hand) and light-headedness (occ).  Negative for dizziness, numbness and headaches.       Objective:   Vitals:   06/13/19 1321  BP: 138/68  Pulse: 60  Resp: 16  Temp: 98.8 F (37.1 C)  SpO2: 99%   BP Readings from Last 3 Encounters:  06/13/19 138/68  04/25/19 (!) 161/72  06/08/18 128/68   Wt Readings from Last 3 Encounters:  06/13/19 169 lb 12.8 oz (77 kg)  04/25/19 160 lb (72.6 kg)  06/08/18 161 lb (73 kg)   Body mass index is 29.15 kg/m.   Physical Exam    Constitutional: Appears well-developed and well-nourished. No distress.  HENT:  Head: Normocephalic and atraumatic.  Neck: Neck supple. No tracheal deviation present. No thyromegaly present.  No cervical lymphadenopathy Cardiovascular: Normal rate, irregular rhythm and normal heart sounds.   No murmur heard. No carotid bruit .  Mild bilateral lower extremity edema edema Pulmonary/Chest: Effort normal and breath sounds normal. No respiratory distress. No has no wheezes. No rales.  Skin: Skin is warm and dry. Not diaphoretic.  Psychiatric: Normal mood and affect. Behavior is normal.      Assessment & Plan:    See Problem List for Assessment and Plan of chronic medical problems.

## 2019-06-13 ENCOUNTER — Other Ambulatory Visit (INDEPENDENT_AMBULATORY_CARE_PROVIDER_SITE_OTHER): Payer: HMO

## 2019-06-13 ENCOUNTER — Other Ambulatory Visit: Payer: Self-pay

## 2019-06-13 ENCOUNTER — Ambulatory Visit (INDEPENDENT_AMBULATORY_CARE_PROVIDER_SITE_OTHER): Payer: HMO | Admitting: Internal Medicine

## 2019-06-13 ENCOUNTER — Encounter: Payer: Self-pay | Admitting: Internal Medicine

## 2019-06-13 ENCOUNTER — Ambulatory Visit (INDEPENDENT_AMBULATORY_CARE_PROVIDER_SITE_OTHER): Payer: HMO | Admitting: *Deleted

## 2019-06-13 VITALS — BP 138/68 | HR 60 | Temp 98.8°F | Resp 16 | Ht 64.0 in | Wt 169.8 lb

## 2019-06-13 VITALS — BP 138/68 | HR 60 | Resp 16 | Ht 64.0 in | Wt 169.0 lb

## 2019-06-13 DIAGNOSIS — R7303 Prediabetes: Secondary | ICD-10-CM

## 2019-06-13 DIAGNOSIS — I2510 Atherosclerotic heart disease of native coronary artery without angina pectoris: Secondary | ICD-10-CM

## 2019-06-13 DIAGNOSIS — F3289 Other specified depressive episodes: Secondary | ICD-10-CM

## 2019-06-13 DIAGNOSIS — I10 Essential (primary) hypertension: Secondary | ICD-10-CM

## 2019-06-13 DIAGNOSIS — N184 Chronic kidney disease, stage 4 (severe): Secondary | ICD-10-CM | POA: Diagnosis not present

## 2019-06-13 DIAGNOSIS — E039 Hypothyroidism, unspecified: Secondary | ICD-10-CM

## 2019-06-13 DIAGNOSIS — I4819 Other persistent atrial fibrillation: Secondary | ICD-10-CM | POA: Diagnosis not present

## 2019-06-13 DIAGNOSIS — G3281 Cerebellar ataxia in diseases classified elsewhere: Secondary | ICD-10-CM

## 2019-06-13 DIAGNOSIS — E785 Hyperlipidemia, unspecified: Secondary | ICD-10-CM

## 2019-06-13 DIAGNOSIS — Z0000 Encounter for general adult medical examination without abnormal findings: Secondary | ICD-10-CM

## 2019-06-13 DIAGNOSIS — I69320 Aphasia following cerebral infarction: Secondary | ICD-10-CM | POA: Diagnosis not present

## 2019-06-13 DIAGNOSIS — I69351 Hemiplegia and hemiparesis following cerebral infarction affecting right dominant side: Secondary | ICD-10-CM

## 2019-06-13 DIAGNOSIS — R1011 Right upper quadrant pain: Secondary | ICD-10-CM | POA: Diagnosis not present

## 2019-06-13 DIAGNOSIS — G8929 Other chronic pain: Secondary | ICD-10-CM

## 2019-06-13 LAB — LIPID PANEL
Cholesterol: 153 mg/dL (ref 0–200)
HDL: 54.4 mg/dL (ref >39.00)
LDL Cholesterol: 65 mg/dL (ref 0–99)
NonHDL: 98.21
Total CHOL/HDL Ratio: 3
Triglycerides: 168 mg/dL — ABNORMAL HIGH (ref 0.0–149.0)
VLDL: 33.6 mg/dL (ref 0.0–40.0)

## 2019-06-13 LAB — COMPREHENSIVE METABOLIC PANEL
ALT: 14 U/L (ref 0–35)
AST: 16 U/L (ref 0–37)
Albumin: 4.1 g/dL (ref 3.5–5.2)
Alkaline Phosphatase: 81 U/L (ref 39–117)
BUN: 20 mg/dL (ref 6–23)
CO2: 24 mEq/L (ref 19–32)
Calcium: 9.9 mg/dL (ref 8.4–10.5)
Chloride: 102 mEq/L (ref 96–112)
Creatinine, Ser: 1.38 mg/dL — ABNORMAL HIGH (ref 0.40–1.20)
GFR: 36.81 mL/min — ABNORMAL LOW (ref >60.00)
Glucose, Bld: 111 mg/dL — ABNORMAL HIGH (ref 70–99)
Potassium: 4.2 mEq/L (ref 3.5–5.1)
Sodium: 137 mEq/L (ref 135–145)
Total Bilirubin: 0.4 mg/dL (ref 0.2–1.2)
Total Protein: 7.6 g/dL (ref 6.0–8.3)

## 2019-06-13 LAB — HEMOGLOBIN A1C: Hgb A1c MFr Bld: 5.8 % (ref 4.6–6.5)

## 2019-06-13 LAB — CBC WITH DIFFERENTIAL/PLATELET
Basophils Absolute: 0.1 10*3/uL (ref 0.0–0.1)
Basophils Relative: 0.5 % (ref 0.0–3.0)
Eosinophils Absolute: 0.1 10*3/uL (ref 0.0–0.7)
Eosinophils Relative: 1 % (ref 0.0–5.0)
HCT: 38.6 % (ref 36.0–46.0)
Hemoglobin: 13.1 g/dL (ref 12.0–15.0)
Lymphocytes Relative: 18.9 % (ref 12.0–46.0)
Lymphs Abs: 1.8 10*3/uL (ref 0.7–4.0)
MCHC: 34 g/dL (ref 30.0–36.0)
MCV: 94.3 fl (ref 78.0–100.0)
Monocytes Absolute: 0.5 10*3/uL (ref 0.1–1.0)
Monocytes Relative: 5.6 % (ref 3.0–12.0)
Neutro Abs: 6.9 10*3/uL (ref 1.4–7.7)
Neutrophils Relative %: 74 % (ref 43.0–77.0)
Platelets: 362 10*3/uL (ref 150.0–400.0)
RBC: 4.09 Mil/uL (ref 3.87–5.11)
RDW: 13.8 % (ref 11.5–15.5)
WBC: 9.3 10*3/uL (ref 4.0–10.5)

## 2019-06-13 LAB — TSH: TSH: 0.86 u[IU]/mL (ref 0.35–4.50)

## 2019-06-13 MED ORDER — ATORVASTATIN CALCIUM 20 MG PO TABS: 20.0000 mg | ORAL_TABLET | Freq: Every day | ORAL | 3 refills | Status: DC

## 2019-06-13 NOTE — Assessment & Plan Note (Signed)
History of stroke Hemiplegia of right side Recent increase in falls and worsening of slurred speech, dysarthria Concern for another stroke Advised to follow-up with neurology-has not seen her neurologist in over a year Continue Eliquis Increase atorvastatin to 20 mg daily-May need to increase further MRI We will order home PT, OT, ST

## 2019-06-13 NOTE — Assessment & Plan Note (Signed)
Worsening of aphasia recently Concern for additional stroke On Eliquis, atorvastatin Advised to follow-up with neurology We will order an MRI Atorvastatin dose increased to 20 mg Lipid panel, CMP, CBC

## 2019-06-13 NOTE — Assessment & Plan Note (Signed)
Chronic Related to thoracic radiculopathy Continue nortriptyline Pain controlled

## 2019-06-13 NOTE — Progress Notes (Addendum)
Subjective:   Valerie Mcclure is a 78 y.o. female who presents for Medicare Annual (Subsequent) preventive examination.  This visit occurred during the SARS-CoV-2 public health emergency.  Safety protocols were in place, including screening questions prior to the visit, additional usage of staff PPE, and extensive cleaning of exam room while observing appropriate contact time as indicated for disinfecting solutions.   Review of Systems:   Cardiac Risk Factors include: advanced age (>40mn, >>58women);dyslipidemia;hypertension Sleep patterns: feels rested on waking, gets up 1-2 times nightly to void and sleeps 7 hours nightly.    Home Safety/Smoke Alarms: Feels safe in home. Smoke alarms in place.  Living environment; residence and Firearm Safety: 1-story house/ trailer, equipment: Cane, [place]: [address] Type: rollator and Hygiene Aides, Type: Tub SSurveyor, quantity Seat Belt Safety/Bike Helmet: Wears seat belt.     Objective:     Vitals: There were no vitals taken for this visit.  There is no height or weight on file to calculate BMI.  Advanced Directives 06/13/2019 05/03/2019 12/30/2018 03/31/2018 07/12/2017 07/12/2017 04/13/2017  Does Patient Have a Medical Advance Directive? _0  No No  Does patient want to make changes to medical advance directive? - - - Yes (ED - Information included in AVS) - - -  Would patient like information on creating a medical advance directive? No - Patient declined No - Patient declined Yes (MAU/Ambulatory/Procedural Areas - Information given) - No - Patient declined No - Patient declined No - Patient declined    Tobacco Social History   Tobacco Use  Smoking Status Never Smoker  Smokeless Tobacco Never Used     Counseling given: Not Answered  Past Medical History:  Diagnosis Date  . Blood transfusion without reported diagnosis   . Chronic low back pain   . History of echocardiogram    Echo 5/18: EF 55-60, Gr 2 DD, mild  MR, mild LAE, PASP 47 // Echo 1/18:  EF 55, mild AI, MAC, mild MR, mild LAE, trivial pericardial effusion  . History of ischemic left MCA stroke 11/2016   left MCA infarct status post mechanical thrombectomy complicated by large left basal ganglia hemorrhage with right shift.  .Marland KitchenHTN (hypertension)   . Hyperlipidemia   . Hypothyroidism   . Osteopenia   . Persistent atrial fibrillation ([name])    CHADS2-VASc=6 (female, age 78 HTN, CVA) // Apixaban // rate control strategy    Past Surgical History:  Procedure Laterality Date  . ABDOMINAL HYSTERECTOMY  1970  . CHOLECYSTECTOMY  07/2009   Dr. WRise Patience . COLONOSCOPY  2003  . FLEXIBLE SIGMOIDOSCOPY  2010  . HAND SURGERY    . IR ANGIO INTRA EXTRACRAN SEL COM CAROTID INNOMINATE UNI L MOD SED  11/29/2016  . IR ANGIO VERTEBRAL SEL SUBCLAVIAN INNOMINATE UNI R MOD SED  11/29/2016  . IR PERCUTANEOUS ART THROMBECTOMY/INFUSION INTRACRANIAL INC DIAG ANGIO  11/29/2016  . IR RADIOLOGIST EVAL & MGMT  03/24/2017  . LUMBAR LAMINECTOMY  11/2008   Done by Dr. CPatrice Paradise . RADIOLOGY WITH ANESTHESIA N/A 11/29/2016   Procedure: RADIOLOGY WITH ANESTHESIA;  Surgeon: Radiologist, Medication, MD;  Location: [facility]  Service: Radiology;  Laterality: N/A;  . THYROIDECTOMY     Family History  Problem Relation Age of Onset  . Heart disease Father 661      MI age 2098s . Lung cancer Brother 894 . Colon cancer Neg Hx   . Esophageal cancer Neg Hx   .  Rectal cancer Neg Hx   . Stomach cancer Neg Hx    Social History   Socioeconomic History  . Marital status: Married    Spouse name: Not on file  . Number of children: 1  . Years of education: Not on file  . Highest education level: Not on file  Occupational History  . Occupation: retired  Scientific laboratory technician  . Financial resource strain: Not very hard  . Food insecurity    Worry: Never true    Inability: Never true  . Transportation needs    Medical: No    Non-medical: No  Tobacco Use  . Smoking status: Never Smoker  .  Smokeless tobacco: Never Used  Substance and Sexual Activity  . Alcohol use: No  . Drug use: No  . Sexual activity: Not Currently  Lifestyle  . Physical activity    Days per week: 0 days    Minutes per session: 0 min  . Stress: Not at all  Relationships  . Social connections    Talks on phone: More than three times a week    Gets together: More than three times a week    Attends religious service: 1 to 4 times per year    Active member of club or organization: Yes    Attends meetings of clubs or organizations: 1 to 4 times per year    Relationship status: Married  Other Topics Concern  . Not on file  Social History Narrative   Married    Outpatient Encounter Medications as of 06/13/2019  Medication Sig  . amLODipine (NORVASC) 10 MG tablet TAKE 1/2 TABLET(5 MG) BY MOUTH DAILY  . atorvastatin (LIPITOR) 20 MG tablet Take 1 tablet (20 mg total) by mouth daily.  . Calcium Carbonate-Vit D-Min (CALCIUM 1200 PO) Take 1 tablet by mouth daily.   . Cholecalciferol (VITAMIN D3) 1000 UNITS CAPS Take 1,000 Units by mouth daily.   Marland Kitchen ELIQUIS 5 MG TABS tablet TAKE 1 TABLET(5 MG) BY MOUTH TWICE DAILY  . FLUoxetine (PROZAC) 40 MG capsule TAKE 1 CAPSULE(40 MG) BY MOUTH DAILY  . hydrALAZINE (APRESOLINE) 25 MG tablet Take 1 tablet (25 mg total) by mouth 3 (three) times daily.  Marland Kitchen levothyroxine (SYNTHROID) 75 MCG tablet TAKE 1 TABLET(75 MCG) BY MOUTH DAILY  . metoprolol tartrate (LOPRESSOR) 25 MG tablet TAKE 1 TABLET(25 MG) BY MOUTH TWICE DAILY  . nortriptyline (PAMELOR) 10 MG capsule TAKE 4 CAPSULES(40 MG) BY MOUTH AT BEDTIME  . polycarbophil (FIBERCON) 625 MG tablet Take 2 tablets (1,250 mg total) by mouth daily.  . potassium chloride SA (K-DUR) 20 MEQ tablet TAKE 1 TABLET(20 MEQ) BY MOUTH 1 TIME DAILY  . vitamin B-12 (CYANOCOBALAMIN) 100 MCG tablet Take 100 mcg by mouth daily.  . [DISCONTINUED] atorvastatin (LIPITOR) 10 MG tablet Take 1 tablet (10 mg total) by mouth daily.   No  facility-administered encounter medications on file as of 06/13/2019.     Activities of Daily Living In your present state of health, do you have any difficulty performing the following activities: 06/13/2019 12/30/2018  Hearing? N N  Vision? N N  Difficulty concentrating or making decisions? Y N  Walking or climbing stairs? Y Y  Comment some with stairs -  Dressing or bathing? N N  Doing errands, shopping? [name]  Preparing Food and eating ? N N  Using the Toilet? N N  In the past six months, have you accidently leaked urine? N -  Do you have problems with loss of bowel  control? N -  Managing your Medications? Y Y  Managing your Finances? [name]  Housekeeping or managing your Housekeeping? Y N  Some recent data might be hidden    Patient Care Team: [name], MD as PCP - General (Internal Medicine) Nahser, Wonda Cheng, MD as PCP - Cardiology (Cardiology) Starling Manns, MD as Consulting Physician (Orthopedic Surgery) Nicholaus Bloom, MD as Consulting Physician (Anesthesiology) Gatha Mayer, MD as Consulting Physician (Gastroenterology) Mcclure Ped, RN as [facility]    Assessment:   This is a routine wellness examination for [place]. Physical assessment deferred to PCP.  Exercise Activities and Dietary recommendations Current Exercise Habits: The patient does not participate in regular exercise at present  PCP has ordered PT and OT   Diet (meal preparation, eat out, water intake, caffeinated beverages, dairy products, fruits and vegetables): in general, a "healthy" diet  , well balanced   Reviewed heart healthy diet. Encouraged patient to increase daily water and healthy fluid intake.  Goals    . Advanced Care Planning complete by next 6 months(continue 05/03/19)    . Client understands the importance of follow-up with providers by attending scheduled visits    . Client will not report change from baseline and no repeated symptoms of stroke  with in the next 6 months(continue 05/03/19)    . Client will report no fall or injuries in the next 6 months(continue 05/03/19)     2 falls     . Client will report no worsening of symptoms of Atrial Fibrillation within the next 90month(continue 05/03/19)    . Client will verbalize knowledge of self management of Hypertension as evidences by BP reading of 140/90 or less; or as defined by provider    . Maintain timely refills of Heart Failure medication as prescribed within the year     . Obtain annual  Lipid Profile, LDL-C    . Patient Stated     Maintain current health status.       Fall Risk Fall Risk  06/13/2019 06/13/2019 03/31/2018 03/28/2018 11/23/2017  Falls in the past year? 1 1 No No No  Comment - - - - -  Number falls in past yr: 1 1 - - -  Injury with Fall? 0 - - - -  Risk Factor Category  - - - - -  Risk for fall due to : Impaired balance/gait;Impaired mobility - - - -  Follow up - - - - -   Is the patient's home free of loose throw rugs in walkways, pet beds, electrical cords, etc?   yes      Grab bars in the bathroom? yes      Handrails on the stairs?   yes      Adequate lighting?   yes  Depression Screen PHQ 2/9 Scores 06/13/2019 06/13/2019 05/03/2019 12/30/2018  PHQ - 2 Score 1 0 0 0  PHQ- 9 Score 1 - - -  Exception Documentation - - - -  Not completed - - - -     Cognitive Function MMSE - Mini Mental State Exam 06/13/2019 03/31/2018  Not completed: Refused -  Orientation to time - 5  Orientation to Place - 4  Registration - 3  Attention/ Calculation - 3  Recall - 2  Language- name 2 objects - 2  Language- repeat - 1  Language- follow 3 step command - 3  Language- read & follow direction - 1  Write a sentence -  1  Copy design - 1  Total score - 26        Immunization History  Administered Date(s) Administered  . Fluad Quad(high Dose 65+) 04/15/2019  . Influenza Split 04/22/2011, 05/11/2013  . Influenza, High Dose Seasonal PF 06/04/2015,  04/07/2016, 04/12/2017, 03/31/2018  . Influenza,inj,Quad PF,6+ Mos 04/03/2014  . Pneumococcal Conjugate-13 12/25/2015  . Pneumococcal Polysaccharide-23 11/18/2010  . Td 07/20/2005   Screening Tests Health Maintenance  Topic Date Due  . TETANUS/TDAP  07/21/2015  . INFLUENZA VACCINE  Completed  . DEXA SCAN  Completed  . PNA vac Low Risk Adult  Completed      Plan:    Reviewed health maintenance screenings with patient today and relevant education, vaccines, and/or referrals were provided.   Continue to eat heart healthy diet (full of fruits, vegetables, whole grains, lean protein, water--limit salt, fat, and sugar intake) and increase physical activity as tolerated.  I have personally reviewed and noted the following in the patient's chart:   . Medical and social history . Use of alcohol, tobacco or illicit drugs  . Current medications and supplements . Functional ability and status . Nutritional status . Physical activity . Advanced directives . List of other physicians . Vitals . Screenings to include cognitive, depression, and falls . Referrals and appointments  In addition, I have reviewed and discussed with patient certain preventive protocols, quality metrics, and best practice recommendations. A written personalized care plan for preventive services as well as general preventive health recommendations were provided to patient.     Michiel Cowboy, RN  06/13/2019   Medical screening examination/treatment/procedure(s) were performed by non-physician practitioner and as supervising physician I was immediately available for consultation/collaboration. I agree with above. [name], MD

## 2019-06-13 NOTE — Assessment & Plan Note (Signed)
Blood pressure adequately controlled and seems to be well controlled at home Continue current medications at current doses CMP

## 2019-06-13 NOTE — Assessment & Plan Note (Signed)
Following with cardiology Persistent atrial fibrillation Occasional palpitations Compliant with Eliquis Taking metoprolol, rate controlled

## 2019-06-13 NOTE — Assessment & Plan Note (Signed)
Check A1c. 

## 2019-06-13 NOTE — Assessment & Plan Note (Signed)
Increased atorvastatin to 20 mg daily Goal LDL less than 70 We will check lipid panel today, but this will reflect the 10 mg daily dose

## 2019-06-13 NOTE — Assessment & Plan Note (Signed)
Clinically euthyroid Check tsh  Titrate med dose if needed  

## 2019-06-13 NOTE — Assessment & Plan Note (Signed)
CMP Following with nephrology

## 2019-06-13 NOTE — Assessment & Plan Note (Signed)
Controlled, stable Continue current dose of Prozac

## 2019-06-13 NOTE — Assessment & Plan Note (Signed)
No concerning chest pain or shortness of breath Following with cardiology Taking Eliquis, atorvastatin

## 2019-06-14 ENCOUNTER — Other Ambulatory Visit: Payer: Self-pay | Admitting: Internal Medicine

## 2019-07-08 ENCOUNTER — Other Ambulatory Visit: Payer: Self-pay

## 2019-07-08 ENCOUNTER — Ambulatory Visit
Admission: RE | Admit: 2019-07-08 | Discharge: 2019-07-08 | Disposition: A | Discharge status: Home / Self Care | Payer: HMO | Source: Ambulatory Visit | Attending: Internal Medicine | Admitting: Internal Medicine

## 2019-07-08 DIAGNOSIS — G3281 Cerebellar ataxia in diseases classified elsewhere: Secondary | ICD-10-CM

## 2019-07-08 DIAGNOSIS — R4781 Slurred speech: Secondary | ICD-10-CM | POA: Diagnosis not present

## 2019-07-08 MED ORDER — GADOBENATE DIMEGLUMINE 529 MG/ML IV SOLN
15.0000 mL | Freq: Once | INTRAVENOUS | Status: AC | PRN
  Administered 2019-07-08: 15 mL via INTRAVENOUS

## 2019-07-11 ENCOUNTER — Other Ambulatory Visit: Payer: HMO

## 2019-07-15 ENCOUNTER — Other Ambulatory Visit: Payer: HMO

## 2019-07-17 ENCOUNTER — Telehealth: Payer: Self-pay

## 2019-07-17 ENCOUNTER — Other Ambulatory Visit: Payer: Self-pay | Admitting: Internal Medicine

## 2019-07-17 NOTE — Telephone Encounter (Signed)
Copied from [place] [id number]. Topic: General - Other >> Jul 17, 2019 12:21 PM Rainey Pines A wrote: Patients spouse would like a callback in regards to patients oral surgery that is scheduled that would cause patient to be without her stroke medication for 7 days and would like to know what Dr.Burns would advise.

## 2019-07-18 NOTE — Telephone Encounter (Signed)
She should not need to hold it for that many days --- she should call cardio and see what their thoughts are -- I would recommend hold it only for 3 days prior if ok with surgeon

## 2019-07-19 ENCOUNTER — Other Ambulatory Visit: Payer: Self-pay | Admitting: Internal Medicine

## 2019-07-19 DIAGNOSIS — I10 Essential (primary) hypertension: Secondary | ICD-10-CM | POA: Diagnosis not present

## 2019-07-19 NOTE — Telephone Encounter (Signed)
Spoke with daughter [name] and info given.

## 2019-07-25 ENCOUNTER — Telehealth: Payer: Self-pay | Admitting: Internal Medicine

## 2019-07-25 DIAGNOSIS — I69320 Aphasia following cerebral infarction: Secondary | ICD-10-CM | POA: Diagnosis not present

## 2019-07-25 DIAGNOSIS — M5414 Radiculopathy, thoracic region: Secondary | ICD-10-CM | POA: Diagnosis not present

## 2019-07-25 DIAGNOSIS — I69321 Dysphasia following cerebral infarction: Secondary | ICD-10-CM | POA: Diagnosis not present

## 2019-07-25 DIAGNOSIS — I69351 Hemiplegia and hemiparesis following cerebral infarction affecting right dominant side: Secondary | ICD-10-CM | POA: Diagnosis not present

## 2019-07-25 DIAGNOSIS — I69322 Dysarthria following cerebral infarction: Secondary | ICD-10-CM | POA: Diagnosis not present

## 2019-07-25 DIAGNOSIS — N184 Chronic kidney disease, stage 4 (severe): Secondary | ICD-10-CM | POA: Diagnosis not present

## 2019-07-25 DIAGNOSIS — G629 Polyneuropathy, unspecified: Secondary | ICD-10-CM | POA: Diagnosis not present

## 2019-07-25 DIAGNOSIS — I4892 Unspecified atrial flutter: Secondary | ICD-10-CM | POA: Diagnosis not present

## 2019-07-25 DIAGNOSIS — F3289 Other specified depressive episodes: Secondary | ICD-10-CM | POA: Diagnosis not present

## 2019-07-25 DIAGNOSIS — I2510 Atherosclerotic heart disease of native coronary artery without angina pectoris: Secondary | ICD-10-CM | POA: Diagnosis not present

## 2019-07-25 DIAGNOSIS — I130 Hypertensive heart and chronic kidney disease with heart failure and stage 1 through stage 4 chronic kidney disease, or unspecified chronic kidney disease: Secondary | ICD-10-CM | POA: Diagnosis not present

## 2019-07-25 DIAGNOSIS — I313 Pericardial effusion (noninflammatory): Secondary | ICD-10-CM | POA: Diagnosis not present

## 2019-07-25 DIAGNOSIS — D631 Anemia in chronic kidney disease: Secondary | ICD-10-CM | POA: Diagnosis not present

## 2019-07-25 DIAGNOSIS — G8929 Other chronic pain: Secondary | ICD-10-CM | POA: Diagnosis not present

## 2019-07-25 DIAGNOSIS — I0981 Rheumatic heart failure: Secondary | ICD-10-CM | POA: Diagnosis not present

## 2019-07-25 DIAGNOSIS — I083 Combined rheumatic disorders of mitral, aortic and tricuspid valves: Secondary | ICD-10-CM | POA: Diagnosis not present

## 2019-07-25 DIAGNOSIS — I5032 Chronic diastolic (congestive) heart failure: Secondary | ICD-10-CM | POA: Diagnosis not present

## 2019-07-25 DIAGNOSIS — R1310 Dysphagia, unspecified: Secondary | ICD-10-CM | POA: Diagnosis not present

## 2019-07-25 DIAGNOSIS — M1712 Unilateral primary osteoarthritis, left knee: Secondary | ICD-10-CM | POA: Diagnosis not present

## 2019-07-25 DIAGNOSIS — I4819 Other persistent atrial fibrillation: Secondary | ICD-10-CM | POA: Diagnosis not present

## 2019-07-25 DIAGNOSIS — M5126 Other intervertebral disc displacement, lumbar region: Secondary | ICD-10-CM | POA: Diagnosis not present

## 2019-07-25 DIAGNOSIS — E039 Hypothyroidism, unspecified: Secondary | ICD-10-CM | POA: Diagnosis not present

## 2019-07-25 DIAGNOSIS — I69391 Dysphagia following cerebral infarction: Secondary | ICD-10-CM | POA: Diagnosis not present

## 2019-07-25 DIAGNOSIS — M8580 Other specified disorders of bone density and structure, unspecified site: Secondary | ICD-10-CM | POA: Diagnosis not present

## 2019-07-25 DIAGNOSIS — K8689 Other specified diseases of pancreas: Secondary | ICD-10-CM | POA: Diagnosis not present

## 2019-07-25 NOTE — Telephone Encounter (Signed)
Noted  

## 2019-07-25 NOTE — Telephone Encounter (Signed)
Andreas Newport, with [facility] (not with patient at time of call) , calling as FYI to inform Dr. Quay Burow that patient fell this morning while opening her refrigerator. Sreejesh states she does not seem to be experiencing any pain and there do not appear to be any injuries.   Also wanted to note that they would be faxing orders to add on PT and Speech Therapy to [facility].

## 2019-07-31 ENCOUNTER — Other Ambulatory Visit: Payer: Self-pay | Admitting: Internal Medicine

## 2019-08-01 DIAGNOSIS — R7303 Prediabetes: Secondary | ICD-10-CM

## 2019-08-01 DIAGNOSIS — G8929 Other chronic pain: Secondary | ICD-10-CM

## 2019-08-01 DIAGNOSIS — N184 Chronic kidney disease, stage 4 (severe): Secondary | ICD-10-CM | POA: Diagnosis not present

## 2019-08-01 DIAGNOSIS — F3289 Other specified depressive episodes: Secondary | ICD-10-CM

## 2019-08-01 DIAGNOSIS — E785 Hyperlipidemia, unspecified: Secondary | ICD-10-CM

## 2019-08-01 DIAGNOSIS — I4892 Unspecified atrial flutter: Secondary | ICD-10-CM

## 2019-08-01 DIAGNOSIS — M8580 Other specified disorders of bone density and structure, unspecified site: Secondary | ICD-10-CM

## 2019-08-01 DIAGNOSIS — D631 Anemia in chronic kidney disease: Secondary | ICD-10-CM | POA: Diagnosis not present

## 2019-08-01 DIAGNOSIS — M1712 Unilateral primary osteoarthritis, left knee: Secondary | ICD-10-CM

## 2019-08-01 DIAGNOSIS — I130 Hypertensive heart and chronic kidney disease with heart failure and stage 1 through stage 4 chronic kidney disease, or unspecified chronic kidney disease: Secondary | ICD-10-CM | POA: Diagnosis not present

## 2019-08-01 DIAGNOSIS — I69321 Dysphasia following cerebral infarction: Secondary | ICD-10-CM | POA: Diagnosis not present

## 2019-08-01 DIAGNOSIS — I5032 Chronic diastolic (congestive) heart failure: Secondary | ICD-10-CM | POA: Diagnosis not present

## 2019-08-01 DIAGNOSIS — I69320 Aphasia following cerebral infarction: Secondary | ICD-10-CM | POA: Diagnosis not present

## 2019-08-01 DIAGNOSIS — Z90711 Acquired absence of uterus with remaining cervical stump: Secondary | ICD-10-CM

## 2019-08-01 DIAGNOSIS — R221 Localized swelling, mass and lump, neck: Secondary | ICD-10-CM

## 2019-08-01 DIAGNOSIS — I69351 Hemiplegia and hemiparesis following cerebral infarction affecting right dominant side: Secondary | ICD-10-CM | POA: Diagnosis not present

## 2019-08-01 DIAGNOSIS — I083 Combined rheumatic disorders of mitral, aortic and tricuspid valves: Secondary | ICD-10-CM

## 2019-08-01 DIAGNOSIS — I69322 Dysarthria following cerebral infarction: Secondary | ICD-10-CM | POA: Diagnosis not present

## 2019-08-01 DIAGNOSIS — M5414 Radiculopathy, thoracic region: Secondary | ICD-10-CM

## 2019-08-01 DIAGNOSIS — I4819 Other persistent atrial fibrillation: Secondary | ICD-10-CM

## 2019-08-01 DIAGNOSIS — M5126 Other intervertebral disc displacement, lumbar region: Secondary | ICD-10-CM

## 2019-08-01 DIAGNOSIS — Z9049 Acquired absence of other specified parts of digestive tract: Secondary | ICD-10-CM

## 2019-08-01 DIAGNOSIS — Z9181 History of falling: Secondary | ICD-10-CM

## 2019-08-01 DIAGNOSIS — G629 Polyneuropathy, unspecified: Secondary | ICD-10-CM

## 2019-08-01 DIAGNOSIS — K8689 Other specified diseases of pancreas: Secondary | ICD-10-CM

## 2019-08-01 DIAGNOSIS — R1310 Dysphagia, unspecified: Secondary | ICD-10-CM | POA: Diagnosis not present

## 2019-08-01 DIAGNOSIS — Z7901 Long term (current) use of anticoagulants: Secondary | ICD-10-CM

## 2019-08-01 DIAGNOSIS — I0981 Rheumatic heart failure: Secondary | ICD-10-CM | POA: Diagnosis not present

## 2019-08-01 DIAGNOSIS — I2510 Atherosclerotic heart disease of native coronary artery without angina pectoris: Secondary | ICD-10-CM | POA: Diagnosis not present

## 2019-08-01 DIAGNOSIS — E039 Hypothyroidism, unspecified: Secondary | ICD-10-CM

## 2019-08-01 DIAGNOSIS — I313 Pericardial effusion (noninflammatory): Secondary | ICD-10-CM

## 2019-08-01 DIAGNOSIS — Z9089 Acquired absence of other organs: Secondary | ICD-10-CM

## 2019-08-01 DIAGNOSIS — I69391 Dysphagia following cerebral infarction: Secondary | ICD-10-CM | POA: Diagnosis not present

## 2019-08-01 DIAGNOSIS — R001 Bradycardia, unspecified: Secondary | ICD-10-CM

## 2019-08-08 DIAGNOSIS — I130 Hypertensive heart and chronic kidney disease with heart failure and stage 1 through stage 4 chronic kidney disease, or unspecified chronic kidney disease: Secondary | ICD-10-CM | POA: Diagnosis not present

## 2019-08-08 DIAGNOSIS — G8929 Other chronic pain: Secondary | ICD-10-CM | POA: Diagnosis not present

## 2019-08-08 DIAGNOSIS — K8689 Other specified diseases of pancreas: Secondary | ICD-10-CM | POA: Diagnosis not present

## 2019-08-08 DIAGNOSIS — I2510 Atherosclerotic heart disease of native coronary artery without angina pectoris: Secondary | ICD-10-CM | POA: Diagnosis not present

## 2019-08-08 DIAGNOSIS — I0981 Rheumatic heart failure: Secondary | ICD-10-CM | POA: Diagnosis not present

## 2019-08-08 DIAGNOSIS — I69322 Dysarthria following cerebral infarction: Secondary | ICD-10-CM | POA: Diagnosis not present

## 2019-08-08 DIAGNOSIS — I4892 Unspecified atrial flutter: Secondary | ICD-10-CM | POA: Diagnosis not present

## 2019-08-08 DIAGNOSIS — I69321 Dysphasia following cerebral infarction: Secondary | ICD-10-CM | POA: Diagnosis not present

## 2019-08-08 DIAGNOSIS — I69320 Aphasia following cerebral infarction: Secondary | ICD-10-CM | POA: Diagnosis not present

## 2019-08-08 DIAGNOSIS — M5126 Other intervertebral disc displacement, lumbar region: Secondary | ICD-10-CM | POA: Diagnosis not present

## 2019-08-08 DIAGNOSIS — F3289 Other specified depressive episodes: Secondary | ICD-10-CM | POA: Diagnosis not present

## 2019-08-08 DIAGNOSIS — N184 Chronic kidney disease, stage 4 (severe): Secondary | ICD-10-CM | POA: Diagnosis not present

## 2019-08-08 DIAGNOSIS — G629 Polyneuropathy, unspecified: Secondary | ICD-10-CM | POA: Diagnosis not present

## 2019-08-08 DIAGNOSIS — M8580 Other specified disorders of bone density and structure, unspecified site: Secondary | ICD-10-CM | POA: Diagnosis not present

## 2019-08-08 DIAGNOSIS — I4819 Other persistent atrial fibrillation: Secondary | ICD-10-CM | POA: Diagnosis not present

## 2019-08-08 DIAGNOSIS — R1310 Dysphagia, unspecified: Secondary | ICD-10-CM | POA: Diagnosis not present

## 2019-08-08 DIAGNOSIS — I313 Pericardial effusion (noninflammatory): Secondary | ICD-10-CM | POA: Diagnosis not present

## 2019-08-08 DIAGNOSIS — I69391 Dysphagia following cerebral infarction: Secondary | ICD-10-CM | POA: Diagnosis not present

## 2019-08-08 DIAGNOSIS — D631 Anemia in chronic kidney disease: Secondary | ICD-10-CM | POA: Diagnosis not present

## 2019-08-08 DIAGNOSIS — I69351 Hemiplegia and hemiparesis following cerebral infarction affecting right dominant side: Secondary | ICD-10-CM | POA: Diagnosis not present

## 2019-08-08 DIAGNOSIS — E039 Hypothyroidism, unspecified: Secondary | ICD-10-CM | POA: Diagnosis not present

## 2019-08-08 DIAGNOSIS — I083 Combined rheumatic disorders of mitral, aortic and tricuspid valves: Secondary | ICD-10-CM | POA: Diagnosis not present

## 2019-08-08 DIAGNOSIS — M5414 Radiculopathy, thoracic region: Secondary | ICD-10-CM | POA: Diagnosis not present

## 2019-08-08 DIAGNOSIS — I5032 Chronic diastolic (congestive) heart failure: Secondary | ICD-10-CM | POA: Diagnosis not present

## 2019-08-08 DIAGNOSIS — M1712 Unilateral primary osteoarthritis, left knee: Secondary | ICD-10-CM | POA: Diagnosis not present

## 2019-08-21 DIAGNOSIS — E039 Hypothyroidism, unspecified: Secondary | ICD-10-CM | POA: Diagnosis not present

## 2019-08-21 DIAGNOSIS — I69351 Hemiplegia and hemiparesis following cerebral infarction affecting right dominant side: Secondary | ICD-10-CM | POA: Diagnosis not present

## 2019-08-21 DIAGNOSIS — I2510 Atherosclerotic heart disease of native coronary artery without angina pectoris: Secondary | ICD-10-CM | POA: Diagnosis not present

## 2019-08-21 DIAGNOSIS — I0981 Rheumatic heart failure: Secondary | ICD-10-CM | POA: Diagnosis not present

## 2019-08-21 DIAGNOSIS — K8689 Other specified diseases of pancreas: Secondary | ICD-10-CM | POA: Diagnosis not present

## 2019-08-21 DIAGNOSIS — M5126 Other intervertebral disc displacement, lumbar region: Secondary | ICD-10-CM | POA: Diagnosis not present

## 2019-08-21 DIAGNOSIS — I69391 Dysphagia following cerebral infarction: Secondary | ICD-10-CM | POA: Diagnosis not present

## 2019-08-21 DIAGNOSIS — G8929 Other chronic pain: Secondary | ICD-10-CM | POA: Diagnosis not present

## 2019-08-21 DIAGNOSIS — I313 Pericardial effusion (noninflammatory): Secondary | ICD-10-CM | POA: Diagnosis not present

## 2019-08-21 DIAGNOSIS — D631 Anemia in chronic kidney disease: Secondary | ICD-10-CM | POA: Diagnosis not present

## 2019-08-21 DIAGNOSIS — I69321 Dysphasia following cerebral infarction: Secondary | ICD-10-CM | POA: Diagnosis not present

## 2019-08-21 DIAGNOSIS — I69320 Aphasia following cerebral infarction: Secondary | ICD-10-CM | POA: Diagnosis not present

## 2019-08-21 DIAGNOSIS — I69322 Dysarthria following cerebral infarction: Secondary | ICD-10-CM | POA: Diagnosis not present

## 2019-08-21 DIAGNOSIS — I4892 Unspecified atrial flutter: Secondary | ICD-10-CM | POA: Diagnosis not present

## 2019-08-21 DIAGNOSIS — G629 Polyneuropathy, unspecified: Secondary | ICD-10-CM | POA: Diagnosis not present

## 2019-08-21 DIAGNOSIS — N184 Chronic kidney disease, stage 4 (severe): Secondary | ICD-10-CM | POA: Diagnosis not present

## 2019-08-21 DIAGNOSIS — M5414 Radiculopathy, thoracic region: Secondary | ICD-10-CM | POA: Diagnosis not present

## 2019-08-21 DIAGNOSIS — I130 Hypertensive heart and chronic kidney disease with heart failure and stage 1 through stage 4 chronic kidney disease, or unspecified chronic kidney disease: Secondary | ICD-10-CM | POA: Diagnosis not present

## 2019-08-21 DIAGNOSIS — I4819 Other persistent atrial fibrillation: Secondary | ICD-10-CM | POA: Diagnosis not present

## 2019-08-21 DIAGNOSIS — F3289 Other specified depressive episodes: Secondary | ICD-10-CM | POA: Diagnosis not present

## 2019-08-21 DIAGNOSIS — M8580 Other specified disorders of bone density and structure, unspecified site: Secondary | ICD-10-CM | POA: Diagnosis not present

## 2019-08-21 DIAGNOSIS — I083 Combined rheumatic disorders of mitral, aortic and tricuspid valves: Secondary | ICD-10-CM | POA: Diagnosis not present

## 2019-08-21 DIAGNOSIS — M1712 Unilateral primary osteoarthritis, left knee: Secondary | ICD-10-CM | POA: Diagnosis not present

## 2019-08-21 DIAGNOSIS — I5032 Chronic diastolic (congestive) heart failure: Secondary | ICD-10-CM | POA: Diagnosis not present

## 2019-08-21 DIAGNOSIS — R1310 Dysphagia, unspecified: Secondary | ICD-10-CM | POA: Diagnosis not present

## 2019-08-22 ENCOUNTER — Other Ambulatory Visit: Payer: Self-pay | Admitting: Internal Medicine

## 2019-09-02 ENCOUNTER — Other Ambulatory Visit: Payer: Self-pay | Admitting: Internal Medicine

## 2019-09-06 ENCOUNTER — Other Ambulatory Visit: Payer: Self-pay

## 2019-09-06 DIAGNOSIS — I69391 Dysphagia following cerebral infarction: Secondary | ICD-10-CM | POA: Diagnosis not present

## 2019-09-06 DIAGNOSIS — M1712 Unilateral primary osteoarthritis, left knee: Secondary | ICD-10-CM | POA: Diagnosis not present

## 2019-09-06 DIAGNOSIS — I4819 Other persistent atrial fibrillation: Secondary | ICD-10-CM | POA: Diagnosis not present

## 2019-09-06 DIAGNOSIS — I69322 Dysarthria following cerebral infarction: Secondary | ICD-10-CM | POA: Diagnosis not present

## 2019-09-06 DIAGNOSIS — I69351 Hemiplegia and hemiparesis following cerebral infarction affecting right dominant side: Secondary | ICD-10-CM | POA: Diagnosis not present

## 2019-09-06 DIAGNOSIS — I130 Hypertensive heart and chronic kidney disease with heart failure and stage 1 through stage 4 chronic kidney disease, or unspecified chronic kidney disease: Secondary | ICD-10-CM | POA: Diagnosis not present

## 2019-09-06 DIAGNOSIS — I2510 Atherosclerotic heart disease of native coronary artery without angina pectoris: Secondary | ICD-10-CM | POA: Diagnosis not present

## 2019-09-06 DIAGNOSIS — M5126 Other intervertebral disc displacement, lumbar region: Secondary | ICD-10-CM | POA: Diagnosis not present

## 2019-09-06 DIAGNOSIS — K8689 Other specified diseases of pancreas: Secondary | ICD-10-CM | POA: Diagnosis not present

## 2019-09-06 DIAGNOSIS — I5032 Chronic diastolic (congestive) heart failure: Secondary | ICD-10-CM | POA: Diagnosis not present

## 2019-09-06 DIAGNOSIS — I69320 Aphasia following cerebral infarction: Secondary | ICD-10-CM | POA: Diagnosis not present

## 2019-09-06 DIAGNOSIS — F3289 Other specified depressive episodes: Secondary | ICD-10-CM | POA: Diagnosis not present

## 2019-09-06 DIAGNOSIS — I083 Combined rheumatic disorders of mitral, aortic and tricuspid valves: Secondary | ICD-10-CM | POA: Diagnosis not present

## 2019-09-06 DIAGNOSIS — N184 Chronic kidney disease, stage 4 (severe): Secondary | ICD-10-CM | POA: Diagnosis not present

## 2019-09-06 DIAGNOSIS — D631 Anemia in chronic kidney disease: Secondary | ICD-10-CM | POA: Diagnosis not present

## 2019-09-06 DIAGNOSIS — I4892 Unspecified atrial flutter: Secondary | ICD-10-CM | POA: Diagnosis not present

## 2019-09-06 DIAGNOSIS — R1310 Dysphagia, unspecified: Secondary | ICD-10-CM | POA: Diagnosis not present

## 2019-09-06 DIAGNOSIS — I69321 Dysphasia following cerebral infarction: Secondary | ICD-10-CM | POA: Diagnosis not present

## 2019-09-06 DIAGNOSIS — G629 Polyneuropathy, unspecified: Secondary | ICD-10-CM | POA: Diagnosis not present

## 2019-09-06 DIAGNOSIS — M8580 Other specified disorders of bone density and structure, unspecified site: Secondary | ICD-10-CM | POA: Diagnosis not present

## 2019-09-06 DIAGNOSIS — I0981 Rheumatic heart failure: Secondary | ICD-10-CM | POA: Diagnosis not present

## 2019-09-06 DIAGNOSIS — M5414 Radiculopathy, thoracic region: Secondary | ICD-10-CM | POA: Diagnosis not present

## 2019-09-06 DIAGNOSIS — E039 Hypothyroidism, unspecified: Secondary | ICD-10-CM | POA: Diagnosis not present

## 2019-09-06 DIAGNOSIS — I313 Pericardial effusion (noninflammatory): Secondary | ICD-10-CM | POA: Diagnosis not present

## 2019-09-06 DIAGNOSIS — G8929 Other chronic pain: Secondary | ICD-10-CM | POA: Diagnosis not present

## 2019-09-06 NOTE — Patient Outreach (Signed)
[facility]) Care Management Chronic Special Needs Program  09/06/2019  Name: Valerie Mcclure DOB: 1941-03-12  MRN: [id number]  [name] is enrolled in a chronic special needs plan for Heart Failure. Chronic Care Management Coordinator telephoned client to review health risk assessment and to develop individualized care plan.  Reviewed the chronic care management program, importance of client participation, and taking their care plan to all provider appointments and inpatient facilities.  Reviewed the transition of care process and possible referral to community care management. Spoke with daughter [name] Designated Party Release due to expressive aphasia HIPAA verified Subjective:  States client is doing better since she has been getting physical therapy.  States she had a fall back in January with no injury.  States she is now using her walker all of the time.  States she is weighting daily and her weights are stable.  States denies any shortness of breath or chest pains.  States her atrial fibrillation is under control and she is taking her blood thinner. States client's B/P is good and her husband checks it about 2-3 times a week.  States client is on waiting list of COVID shot.  Denies any issues with affording medications and daughter assists client with her medications  Goals Addressed            This Visit's Progress   . COMPLETED: Advanced Care Planning complete by next 6 months(continue 05/03/19)   Not on track    Reviewed importance of getting Advanced Directives  completed Client has forms but has not completed  Client does not wish to work on this goal at this time Goal not completed and ended on 09/06/19    . Client understands the importance of follow-up with providers by attending scheduled visits   On track    Plan to keep scheduled appointments with providers Reinforced to keep scheduled appointments with providers    . Client  verbalize knowledge of Heart Failure disease self management skills by next 6 months       Signs and symptoms of heart failure reviewed Heart failure zones reviewed  Advised to notify doctor for symptoms Call 911 for severe symptoms Weigh daily and record weighs Follow a low salt diet  Assigned EMMI Heart Failure    . Client will not report change from baseline and no repeated symptoms of stroke with in the next 6 months(continue 09/06/19)   On track    Reports no stroke symptoms Reviewed stoke symptoms and when to call 911    . Client will report no fall or injuries in the next 6 months(continue 09/06/19)   Not on track    Reports one fall with no injury Client currently receiving physical therapy  Reinforced fall precautions and home safety Stand up slowly Use cane or walker at all times Use grabber to pick up objects on the floor Keep home well-lit and wear your glasses  Assigned EMMI Preventing Falls in the Older Adult     . Client will report no worsening of symptoms of Atrial Fibrillation within the next 6months(continue 09/06/19)   On track    Plan to take anticoagulant as ordered to prevent stroke Maintaining timely refills per dispense report Reviewed atrial fibrillation action plan in [employer](HTA) calendar    . Client will report the importance of monitoring weight for Heart Failure management as evidenced by verbalizing signs of exacerbation.       Reports weighing daily Reinforced to record daily  weights      . Client will verbalize knowledge of self management of Hypertension as evidences by BP reading of 140/90 or less; or as defined by provider   On track    Reports B/P less than 140/90 when checked at home and providers visits Plan to check B/P regularly Take B/P medications as ordered Plan to follow a low salt diet  Increase activity as tolerated    . Obtain annual  Lipid Profile, LDL-C   On track    Last completed 06/13/19 LDL 65 The goal for LDL  is less than 70 mg/dL as you are at high risk for complications Try to avoid saturated fats, trans-fats and eat more fiber    . Patient Stated   On track    Maintain current health status. Client currently receiving physical therapy     . Visit Primary Care Provider or Cardiologist at least 2 times per year       Last Annual Wellness visit 06/13/19 Last Primary care provider visit 06/13/19 Reinforced to keep scheduled primary care visit on 12/11/19     Reports no s/s of HF and stable weights Client has Advanced Directives forms but does not wish to complete at this time Reviewed number for 24-hour nurse Line Reviewed COVID 19 precautions Encouraged to sign up for COVID vaccination when possible Plan:  Send successful outreach letter with a copy of their individualized care plan, Send individual care plan to provider and Send educational material  Chronic care management coordination will outreach in:  6 Months     Peter Garter RN, [facility], [place] Management Coordinator [place] [facility] [phone]

## 2019-09-10 ENCOUNTER — Other Ambulatory Visit: Payer: Self-pay | Admitting: Internal Medicine

## 2019-09-13 ENCOUNTER — Telehealth: Payer: Self-pay

## 2019-09-13 NOTE — Telephone Encounter (Signed)
Noted  

## 2019-09-13 NOTE — Telephone Encounter (Signed)
Spoke with pts daughter and she advised that pt is doing ok for now. Her hand is just bruised and she does not feel that she needs to be seen or needs an xray at this time. Will call back if hand does not improve.

## 2019-09-13 NOTE — Telephone Encounter (Signed)
Home health nurse called in about patient, saying she had a fall on last Monday and left hand is bruised. Wanted to notify the Dr    Please advise

## 2019-09-21 ENCOUNTER — Ambulatory Visit: Payer: HMO

## 2019-10-15 ENCOUNTER — Other Ambulatory Visit: Payer: Self-pay | Admitting: Internal Medicine

## 2019-10-16 NOTE — Progress Notes (Signed)
Subjective:    Patient ID: Valerie Mcclure, female    DOB: 1940-11-27, 79 y.o.   MRN: [id number]  HPI The patient is here for an acute visit.  She is here with her daughter.   Swollen feet:  Acute on chronic.  She has had swelling in her lower legs for a while, but it started to get worse the end of last week.  She is unsure why.  She denies any changes in medication or supplements.  She denies any changes in activity or diet.  She was on furosemide in the past.  She is not always compliant with a low-sodium diet.  She does not elevate her legs when she is sitting.  She is not exercising regularly.  She denies any chest pain, palpitations and shortness of breath.     Medications and allergies reviewed with patient and updated if appropriate.  Patient Active Problem List   Diagnosis Date Noted  . Lump in neck 06/08/2018  . Sleep difficulties 03/31/2018  . Persistent atrial fibrillation ([place]) 09/06/2017  . TIA (transient ischemic attack) 07/12/2017  . Bradycardia 04/12/2017  . Depression 01/29/2017  . Fall   . Aphasia as late effect of cerebrovascular accident (CVA)   . Chronic diastolic heart failure ([place])   . Dysarthria, post-stroke   . Atrial flutter ([place]) 12/22/2016  . Spastic hemiplegia of right dominant side as late effect of cerebral infarction ([place])   . Anemia of chronic disease   . Essential hypertension   . Dysphagia, post-stroke   . Anterior cerebral circulation hemorrhagic infarction ([place]) 12/03/2016  . Right hemiparesis ([place])   . Nontraumatic subcortical hemorrhage of left cerebral hemisphere ([place])   . Mild aortic regurgitation 08/20/2016  . Bilateral edema of lower extremity 04/07/2016  . Pancreatic duct dilated 01/09/2016  . Stage 4 chronic kidney disease ([place]) 12/25/2015  . Mild tricuspid regurgitation 12/25/2015  . Mild mitral regurgitation 12/25/2015  . Prediabetes 06/10/2015  . Abdominal pain, chronic, right upper quadrant 06/04/2015  . Chronic low  back pain   . PAIN IN THORACIC SPINE 04/18/2010  . Coronary atherosclerosis 01/24/2009  . DEGENERATIVE JOINT DISEASE 01/23/2009  . ARTHRITIS, LEFT KNEE 12/13/2008  . HERNIATED LUMBAR DISC 12/13/2008  . Hypothyroidism 11/21/2007  . Hyperlipidemia 11/09/2007  . Osteopenia 11/09/2007    Current Outpatient Medications on File Prior to Visit  Medication Sig Dispense Refill  . amLODipine (NORVASC) 10 MG tablet TAKE 1/2 TABLET(5 MG) BY MOUTH DAILY 45 tablet 0  . atorvastatin (LIPITOR) 20 MG tablet Take 1 tablet (20 mg total) by mouth daily. 90 tablet 3  . Calcium Carbonate-Vit D-Min (CALCIUM 1200 PO) Take 1 tablet by mouth daily.     . Cholecalciferol (VITAMIN D3) 1000 UNITS CAPS Take 1,000 Units by mouth daily.     Marland Kitchen ELIQUIS 5 MG TABS tablet TAKE 1 TABLET(5 MG) BY MOUTH TWICE DAILY 180 tablet 1  . FLUoxetine (PROZAC) 40 MG capsule TAKE 1 CAPSULE(40 MG) BY MOUTH DAILY 90 capsule 1  . hydrALAZINE (APRESOLINE) 25 MG tablet TAKE 1 TABLET(25 MG) BY MOUTH THREE TIMES DAILY 270 tablet 1  . levothyroxine (SYNTHROID) 75 MCG tablet TAKE 1 TABLET(75 MCG) BY MOUTH DAILY 90 tablet 1  . metoprolol tartrate (LOPRESSOR) 25 MG tablet TAKE 1 TABLET(25 MG) BY MOUTH TWICE DAILY 180 tablet 1  . nortriptyline (PAMELOR) 10 MG capsule TAKE 4 CAPSULES(40 MG) BY MOUTH AT BEDTIME 360 capsule 0  . polycarbophil (FIBERCON) 625 MG tablet Take 2 tablets (1,250 mg  total) by mouth daily. 60 tablet 0  . potassium chloride SA (KLOR-CON) 20 MEQ tablet TAKE 1 TABLET(20 MEQ) BY MOUTH 1 TIME DAILY 90 tablet 1  . vitamin B-12 (CYANOCOBALAMIN) 100 MCG tablet Take 100 mcg by mouth daily.     No current facility-administered medications on file prior to visit.    Past Medical History:  Diagnosis Date  . Blood transfusion without reported diagnosis   . Chronic low back pain   . History of echocardiogram    Echo 5/18: EF 55-60, Gr 2 DD, mild MR, mild LAE, PASP 47 // Echo 1/18:  EF 55, mild AI, MAC, mild MR, mild LAE, trivial  pericardial effusion  . History of ischemic left MCA stroke 11/2016   left MCA infarct status post mechanical thrombectomy complicated by large left basal ganglia hemorrhage with right shift.  Marland Kitchen HTN (hypertension)   . Hyperlipidemia   . Hypothyroidism   . Osteopenia   . Persistent atrial fibrillation ([name])    CHADS2-VASc=6 (female, age 50, HTN, CVA) // Apixaban // rate control strategy     Past Surgical History:  Procedure Laterality Date  . ABDOMINAL HYSTERECTOMY  1970  . CHOLECYSTECTOMY  07/2009   Dr. Rise Patience  . COLONOSCOPY  2003  . FLEXIBLE SIGMOIDOSCOPY  2010  . HAND SURGERY    . IR ANGIO INTRA EXTRACRAN SEL COM CAROTID INNOMINATE UNI L MOD SED  11/29/2016  . IR ANGIO VERTEBRAL SEL SUBCLAVIAN INNOMINATE UNI R MOD SED  11/29/2016  . IR PERCUTANEOUS ART THROMBECTOMY/INFUSION INTRACRANIAL INC DIAG ANGIO  11/29/2016  . IR RADIOLOGIST EVAL & MGMT  03/24/2017  . LUMBAR LAMINECTOMY  11/2008   Done by Dr. Patrice Paradise  . RADIOLOGY WITH ANESTHESIA N/A 11/29/2016   Procedure: RADIOLOGY WITH ANESTHESIA;  Surgeon: Radiologist, Medication, MD;  Location: [facility];  Service: Radiology;  Laterality: N/A;  . THYROIDECTOMY      Social History   Socioeconomic History  . Marital status: Married    Spouse name: Not on file  . Number of children: 1  . Years of education: Not on file  . Highest education level: Not on file  Occupational History  . Occupation: retired  Tobacco Use  . Smoking status: Never Smoker  . Smokeless tobacco: Never Used  Substance and Sexual Activity  . Alcohol use: No  . Drug use: No  . Sexual activity: Not Currently  Other Topics Concern  . Not on file  Social History Narrative   Married   Social Determinants of Health   Financial Resource Strain:   . Difficulty of Paying Living Expenses:   Food Insecurity: No Food Insecurity  . Worried About Charity fundraiser in the Last Year: Never true  . Ran Out of Food in the Last Year: Never true  Transportation Needs: No  Transportation Needs  . Lack of Transportation (Medical): No  . Lack of Transportation (Non-Medical): No  Physical Activity:   . Days of Exercise per Week:   . Minutes of Exercise per Session:   Stress:   . Feeling of Stress :   Social Connections:   . Frequency of Communication with Friends and Family:   . Frequency of Social Gatherings with Friends and Family:   . Attends Religious Services:   . Active Member of Clubs or Organizations:   . Attends Archivist Meetings:   Marland Kitchen Marital Status:     Family History  Problem Relation Age of Onset  . Heart disease Father 17  MI age 27s  . Lung cancer Brother 26  . Colon cancer Neg Hx   . Esophageal cancer Neg Hx   . Rectal cancer Neg Hx   . Stomach cancer Neg Hx     Review of Systems  Constitutional: Negative for chills and fever.  Respiratory: Negative for cough, shortness of breath and wheezing.   Cardiovascular: Positive for leg swelling. Negative for chest pain and palpitations.  Neurological: Negative for light-headedness and headaches.       Objective:   Vitals:   10/17/19 1105  BP: (!) 146/68  Pulse: 60  Resp: 18  Temp: 97.7 F (36.5 C)  SpO2: 99%   BP Readings from Last 3 Encounters:  10/17/19 (!) 146/68  06/13/19 138/68  06/13/19 138/68   Wt Readings from Last 3 Encounters:  10/17/19 171 lb (77.6 kg)  06/13/19 169 lb (76.7 kg)  06/13/19 169 lb 12.8 oz (77 kg)   Body mass index is 29.35 kg/m.   Physical Exam    Constitutional: Appears well-developed and well-nourished. No distress.  Head: Normocephalic and atraumatic.  Neck: Neck supple. No tracheal deviation present. No thyromegaly present.  No cervical lymphadenopathy Cardiovascular: Normal rate, regular rhythm and normal heart sounds.  No murmur heard. No carotid bruit .  1+ bilateral lower extremity pitting edema Pulmonary/Chest: Effort normal and breath sounds normal. No respiratory distress. No has no wheezes. No rales.  Skin:  Skin is warm and dry. Not diaphoretic.  No lower extremity erythema, lesions or wounds Psychiatric: Normal mood and affect. Behavior is normal.       Assessment & Plan:    See Problem List for Assessment and Plan of chronic medical problems.    This visit occurred during the SARS-CoV-2 public health emergency.  Safety protocols were in place, including screening questions prior to the visit, additional usage of staff PPE, and extensive cleaning of exam room while observing appropriate contact time as indicated for disinfecting solutions.

## 2019-10-17 ENCOUNTER — Ambulatory Visit (INDEPENDENT_AMBULATORY_CARE_PROVIDER_SITE_OTHER): Payer: HMO | Admitting: Internal Medicine

## 2019-10-17 ENCOUNTER — Other Ambulatory Visit: Payer: Self-pay

## 2019-10-17 ENCOUNTER — Encounter: Payer: Self-pay | Admitting: Internal Medicine

## 2019-10-17 VITALS — BP 146/68 | HR 60 | Temp 97.7°F | Resp 18 | Ht 64.0 in | Wt 171.0 lb

## 2019-10-17 DIAGNOSIS — R600 Localized edema: Secondary | ICD-10-CM | POA: Diagnosis not present

## 2019-10-17 MED ORDER — AMLODIPINE BESYLATE 5 MG PO TABS: 5.0000 mg | ORAL_TABLET | Freq: Every day | ORAL | 3 refills | Status: DC

## 2019-10-17 MED ORDER — FUROSEMIDE 20 MG PO TABS: 20.0000 mg | ORAL_TABLET | Freq: Every day | ORAL | 1 refills | Status: DC

## 2019-10-17 NOTE — Assessment & Plan Note (Signed)
Acute on chronic Her chronic lower extremity edema has been worse recently No shortness of breath or concerning symptoms for exacerbation of her chronic diastolic heart failure Stressed low-sodium, elevating legs when sitting and regular exercise Discussed compression socks, but she does not feel she would be able to get these on Restart furosemide 20 mg daily BMP next week She will monitor her blood pressure at home She will call with questions or if her swelling does not improve

## 2019-10-17 NOTE — Patient Instructions (Addendum)
  Blood work was ordered.  To be done next week.    Medications reviewed and updated.  Changes include :  Start lasix 20 mg daily    Your prescription(s) have been submitted to your pharmacy. Please take as directed and contact our office if you believe you are having problem(s) with the medication(s).

## 2019-10-23 ENCOUNTER — Other Ambulatory Visit: Payer: HMO

## 2019-10-25 ENCOUNTER — Other Ambulatory Visit (INDEPENDENT_AMBULATORY_CARE_PROVIDER_SITE_OTHER): Payer: HMO

## 2019-10-25 ENCOUNTER — Other Ambulatory Visit: Payer: Self-pay

## 2019-10-25 ENCOUNTER — Telehealth: Payer: Self-pay | Admitting: Internal Medicine

## 2019-10-25 DIAGNOSIS — N184 Chronic kidney disease, stage 4 (severe): Secondary | ICD-10-CM

## 2019-10-25 DIAGNOSIS — R600 Localized edema: Secondary | ICD-10-CM

## 2019-10-25 LAB — BASIC METABOLIC PANEL
BUN: 36 mg/dL — ABNORMAL HIGH (ref 6–23)
CO2: 29 mEq/L (ref 19–32)
Calcium: 9.5 mg/dL (ref 8.4–10.5)
Chloride: 101 mEq/L (ref 96–112)
Creatinine, Ser: 1.78 mg/dL — ABNORMAL HIGH (ref 0.40–1.20)
GFR: 27.41 mL/min — ABNORMAL LOW (ref >60.00)
Glucose, Bld: 94 mg/dL (ref 70–99)
Potassium: 4.2 mEq/L (ref 3.5–5.1)
Sodium: 138 mEq/L (ref 135–145)

## 2019-10-25 NOTE — Telephone Encounter (Signed)
Her kidney function is worse after starting the lasix.  Is her leg swelling better?   Does she see her kidney doctor any time soon?  Her kidney function needs to be rechecked at some point soon.    Would she be able to take the lasix every other day?

## 2019-10-26 MED ORDER — CICLOPIROX OLAMINE 0.77 % EX CREA: TOPICAL_CREAM | Freq: Two times a day (BID) | CUTANEOUS | 0 refills | Status: DC

## 2019-10-26 NOTE — Telephone Encounter (Signed)
Blood work ordered to recheck kidney function.  Cream sent to pharmacy for probable yeast infection.

## 2019-10-26 NOTE — Telephone Encounter (Signed)
Appointment made for labs. Aware of rx being sent to pharmacy.

## 2019-10-26 NOTE — Telephone Encounter (Signed)
Spoke with daughter. She advised that leg swelling was better. Does not have an appointment to see kidney doctor any time soon. Do you want to recheck kidney function here? Would you like her to follow up with kidney doctor?    Daughter also is asking if something can be sent in for patient for a rash at the crease of her stomach area. Would you prefer a visit?

## 2019-10-30 ENCOUNTER — Other Ambulatory Visit (INDEPENDENT_AMBULATORY_CARE_PROVIDER_SITE_OTHER): Payer: HMO

## 2019-10-30 ENCOUNTER — Other Ambulatory Visit: Payer: HMO

## 2019-10-30 ENCOUNTER — Other Ambulatory Visit: Payer: Self-pay

## 2019-10-30 DIAGNOSIS — N184 Chronic kidney disease, stage 4 (severe): Secondary | ICD-10-CM | POA: Diagnosis not present

## 2019-10-30 LAB — BASIC METABOLIC PANEL
BUN: 30 mg/dL — ABNORMAL HIGH (ref 6–23)
CO2: 27 mEq/L (ref 19–32)
Calcium: 9.5 mg/dL (ref 8.4–10.5)
Chloride: 101 mEq/L (ref 96–112)
Creatinine, Ser: 1.74 mg/dL — ABNORMAL HIGH (ref 0.40–1.20)
GFR: 28.14 mL/min — ABNORMAL LOW (ref >60.00)
Glucose, Bld: 92 mg/dL (ref 70–99)
Potassium: 4.3 mEq/L (ref 3.5–5.1)
Sodium: 137 mEq/L (ref 135–145)

## 2019-11-02 MED ORDER — ELIQUIS 2.5 MG PO TABS: 2.5000 mg | ORAL_TABLET | Freq: Two times a day (BID) | ORAL | 1 refills | Status: DC

## 2019-11-02 NOTE — Addendum Note (Signed)
Addended by: Delice Bison E on: 11/02/2019 03:53 PM   Modules accepted: Orders

## 2019-11-13 ENCOUNTER — Telehealth: Payer: Self-pay | Admitting: Internal Medicine

## 2019-11-13 MED ORDER — FUROSEMIDE 20 MG PO TABS: 10.0000 mg | ORAL_TABLET | Freq: Every day | ORAL | 1 refills | Status: DC

## 2019-11-13 NOTE — Telephone Encounter (Signed)
New message:    Pt's daughter is calling and states her mom(pt) was prescribed a dieretic and she states when she does not take this medication her feet swell. Please advise.

## 2019-11-13 NOTE — Addendum Note (Signed)
Addended by: [name] on: 11/13/2019 02:26 PM   Modules accepted: Orders

## 2019-11-13 NOTE — Telephone Encounter (Signed)
She can restart the medication - if she can only take 1/2 of a pill daily or take 1 pill every other day that would be better than one pill every day.   We will recheck her kidney function next month at her appt.

## 2019-11-13 NOTE — Telephone Encounter (Signed)
Will try a half pill daily and see how that does first.

## 2019-12-02 ENCOUNTER — Other Ambulatory Visit: Payer: Self-pay | Admitting: Internal Medicine

## 2019-12-04 ENCOUNTER — Other Ambulatory Visit: Payer: Self-pay | Admitting: Internal Medicine

## 2019-12-10 NOTE — Progress Notes (Signed)
Subjective:    Patient ID: Valerie Mcclure, female    DOB: Jun 03, 1941, 79 y.o.   MRN: [id number]  HPI The patient is here for follow up of their chronic medical problems, including Afib, CAD, htn, hyperlipidemia,  Hypothyroidism, CKD,   H/o CVA w/ dysphasia dn R sided weakness, depression, prediabetes, chronic RUQ pain from thoracic neuropathy, sleep difficulties, LE edema  She is exercising regularly.      Leg edema  - gets worse during day.  Elevates legs.  Does not use compression socks.  Taking lasix 10 mg QOD.       Medications and allergies reviewed with patient and updated if appropriate.  Patient Active Problem List   Diagnosis Date Noted  . Lump in neck 06/08/2018  . Sleep difficulties 03/31/2018  . Persistent atrial fibrillation ([place]) 09/06/2017  . TIA (transient ischemic attack) 07/12/2017  . Bradycardia 04/12/2017  . Depression 01/29/2017  . Fall   . Aphasia as late effect of cerebrovascular accident (CVA)   . Chronic diastolic heart failure ([place])   . Dysarthria, post-stroke   . Atrial flutter ([place]) 12/22/2016  . Spastic hemiplegia of right dominant side as late effect of cerebral infarction ([place])   . Anemia of chronic disease   . Essential hypertension   . Dysphagia, post-stroke   . Anterior cerebral circulation hemorrhagic infarction ([place]) 12/03/2016  . Right hemiparesis ([place])   . Nontraumatic subcortical hemorrhage of left cerebral hemisphere ([place])   . Mild aortic regurgitation 08/20/2016  . Bilateral edema of lower extremity 04/07/2016  . Pancreatic duct dilated 01/09/2016  . Stage 4 chronic kidney disease ([place]) 12/25/2015  . Mild tricuspid regurgitation 12/25/2015  . Mild mitral regurgitation 12/25/2015  . Prediabetes 06/10/2015  . Abdominal pain, chronic, right upper quadrant 06/04/2015  . Chronic low back pain   . Pain in thoracic spine 04/18/2010  . Coronary atherosclerosis 01/24/2009  . DEGENERATIVE JOINT DISEASE 01/23/2009  . ARTHRITIS,  LEFT KNEE 12/13/2008  . HERNIATED LUMBAR DISC 12/13/2008  . Hypothyroidism 11/21/2007  . Hyperlipidemia 11/09/2007  . Osteopenia 11/09/2007    Current Outpatient Medications on File Prior to Visit  Medication Sig Dispense Refill  . amLODipine (NORVASC) 5 MG tablet Take 1 tablet (5 mg total) by mouth daily. 90 tablet 3  . atorvastatin (LIPITOR) 20 MG tablet Take 1 tablet (20 mg total) by mouth daily. 90 tablet 3  . Calcium Carbonate-Vit D-Min (CALCIUM 1200 PO) Take 1 tablet by mouth daily.     . Cholecalciferol (VITAMIN D3) 1000 UNITS CAPS Take 1,000 Units by mouth daily.     . ciclopirox (LOPROX) 0.77 % cream Apply topically 2 (two) times daily. 90 g 0  . ELIQUIS 2.5 MG TABS tablet Take 1 tablet (2.5 mg total) by mouth 2 (two) times daily. 60 tablet 1  . FLUoxetine (PROZAC) 40 MG capsule TAKE 1 CAPSULE(40 MG) BY MOUTH DAILY 90 capsule 1  . furosemide (LASIX) 20 MG tablet Take 0.5 tablets (10 mg total) by mouth daily. 45 tablet 1  . hydrALAZINE (APRESOLINE) 25 MG tablet TAKE 1 TABLET(25 MG) BY MOUTH THREE TIMES DAILY 270 tablet 1  . levothyroxine (SYNTHROID) 75 MCG tablet TAKE 1 TABLET(75 MCG) BY MOUTH DAILY 30 tablet 0  . metoprolol tartrate (LOPRESSOR) 25 MG tablet TAKE 1 TABLET(25 MG) BY MOUTH TWICE DAILY 180 tablet 1  . nortriptyline (PAMELOR) 10 MG capsule TAKE 4 CAPSULES(40 MG) BY MOUTH AT BEDTIME 360 capsule 0  . polycarbophil (FIBERCON) 625 MG tablet Take 2  tablets (1,250 mg total) by mouth daily. 60 tablet 0  . potassium chloride SA (KLOR-CON) 20 MEQ tablet TAKE 1 TABLET(20 MEQ) BY MOUTH 1 TIME DAILY 90 tablet 1  . vitamin B-12 (CYANOCOBALAMIN) 100 MCG tablet Take 100 mcg by mouth daily.     No current facility-administered medications on file prior to visit.    Past Medical History:  Diagnosis Date  . Blood transfusion without reported diagnosis   . Chronic low back pain   . History of echocardiogram    Echo 5/18: EF 55-60, Gr 2 DD, mild MR, mild LAE, PASP 47 // Echo 1/18:   EF 55, mild AI, MAC, mild MR, mild LAE, trivial pericardial effusion  . History of ischemic left MCA stroke 11/2016   left MCA infarct status post mechanical thrombectomy complicated by large left basal ganglia hemorrhage with right shift.  Marland Kitchen HTN (hypertension)   . Hyperlipidemia   . Hypothyroidism   . Osteopenia   . Persistent atrial fibrillation ([name])    CHADS2-VASc=6 (female, age 61, HTN, CVA) // Apixaban // rate control strategy     Past Surgical History:  Procedure Laterality Date  . ABDOMINAL HYSTERECTOMY  1970  . CHOLECYSTECTOMY  07/2009   Dr. Rise Patience  . COLONOSCOPY  2003  . FLEXIBLE SIGMOIDOSCOPY  2010  . HAND SURGERY    . IR ANGIO INTRA EXTRACRAN SEL COM CAROTID INNOMINATE UNI L MOD SED  11/29/2016  . IR ANGIO VERTEBRAL SEL SUBCLAVIAN INNOMINATE UNI R MOD SED  11/29/2016  . IR PERCUTANEOUS ART THROMBECTOMY/INFUSION INTRACRANIAL INC DIAG ANGIO  11/29/2016  . IR RADIOLOGIST EVAL & MGMT  03/24/2017  . LUMBAR LAMINECTOMY  11/2008   Done by Dr. Patrice Paradise  . RADIOLOGY WITH ANESTHESIA N/A 11/29/2016   Procedure: RADIOLOGY WITH ANESTHESIA;  Surgeon: Radiologist, Medication, MD;  Location: [facility];  Service: Radiology;  Laterality: N/A;  . THYROIDECTOMY      Social History   Socioeconomic History  . Marital status: Married    Spouse name: Not on file  . Number of children: 1  . Years of education: Not on file  . Highest education level: Not on file  Occupational History  . Occupation: retired  Tobacco Use  . Smoking status: Never Smoker  . Smokeless tobacco: Never Used  Substance and Sexual Activity  . Alcohol use: No  . Drug use: No  . Sexual activity: Not Currently  Other Topics Concern  . Not on file  Social History Narrative   Married   Social Determinants of Health   Financial Resource Strain:   . Difficulty of Paying Living Expenses:   Food Insecurity: No Food Insecurity  . Worried About Charity fundraiser in the Last Year: Never true  . Ran Out of Food in the  Last Year: Never true  Transportation Needs: No Transportation Needs  . Lack of Transportation (Medical): No  . Lack of Transportation (Non-Medical): No  Physical Activity:   . Days of Exercise per Week:   . Minutes of Exercise per Session:   Stress:   . Feeling of Stress :   Social Connections:   . Frequency of Communication with Friends and Family:   . Frequency of Social Gatherings with Friends and Family:   . Attends Religious Services:   . Active Member of Clubs or Organizations:   . Attends Archivist Meetings:   Marland Kitchen Marital Status:     Family History  Problem Relation Age of Onset  . Heart disease Father  21       MI age 76s  . Lung cancer Brother 16  . Colon cancer Neg Hx   . Esophageal cancer Neg Hx   . Rectal cancer Neg Hx   . Stomach cancer Neg Hx     Review of Systems  Constitutional: Negative for chills and fever.  Respiratory: Negative for cough, shortness of breath and wheezing.   Cardiovascular: Positive for palpitations (occ) and leg swelling. Negative for chest pain.  Neurological: Negative for light-headedness and headaches.       Objective:   Vitals:   12/11/19 1409  BP: (!) 144/72  Pulse: 74  Resp: 16  Temp: 98.4 F (36.9 C)  SpO2: 97%   BP Readings from Last 3 Encounters:  12/11/19 (!) 144/72  10/17/19 (!) 146/68  06/13/19 138/68   Wt Readings from Last 3 Encounters:  12/11/19 170 lb (77.1 kg)  10/17/19 171 lb (77.6 kg)  06/13/19 169 lb (76.7 kg)   Body mass index is 29.18 kg/m.   Physical Exam    Constitutional: Appears well-developed and well-nourished. No distress.  HENT:  Head: Normocephalic and atraumatic.  Neck: Neck supple. No tracheal deviation present. No thyromegaly present.  No cervical lymphadenopathy Cardiovascular: Normal rate, regular rhythm and normal heart sounds.   No murmur heard. No carotid bruit .  Mild b/l LE edema Pulmonary/Chest: Effort normal and breath sounds normal. No respiratory distress. No  has no wheezes. No rales.  Skin: Skin is warm and dry. Not diaphoretic.  Psychiatric: Normal mood and affect. Behavior is normal.      Assessment & Plan:    See Problem List for Assessment and Plan of chronic medical problems.    This visit occurred during the SARS-CoV-2 public health emergency.  Safety protocols were in place, including screening questions prior to the visit, additional usage of staff PPE, and extensive cleaning of exam room while observing appropriate contact time as indicated for disinfecting solutions.

## 2019-12-10 NOTE — Patient Instructions (Addendum)
  Blood work was ordered.     Medications reviewed and updated.  Changes include :   none  Your prescription(s) have been submitted to your pharmacy. Please take as directed and contact our office if you believe you are having problem(s) with the medication(s).  Follow up with Dr Hollie Salk.     Please followup in 6 months

## 2019-12-11 ENCOUNTER — Ambulatory Visit (INDEPENDENT_AMBULATORY_CARE_PROVIDER_SITE_OTHER): Payer: HMO | Admitting: Internal Medicine

## 2019-12-11 ENCOUNTER — Other Ambulatory Visit: Payer: Self-pay

## 2019-12-11 ENCOUNTER — Encounter: Payer: Self-pay | Admitting: Internal Medicine

## 2019-12-11 VITALS — BP 144/72 | HR 74 | Temp 98.4°F | Resp 16 | Ht 64.0 in | Wt 170.0 lb

## 2019-12-11 DIAGNOSIS — I10 Essential (primary) hypertension: Secondary | ICD-10-CM | POA: Diagnosis not present

## 2019-12-11 DIAGNOSIS — R600 Localized edema: Secondary | ICD-10-CM

## 2019-12-11 DIAGNOSIS — E039 Hypothyroidism, unspecified: Secondary | ICD-10-CM | POA: Diagnosis not present

## 2019-12-11 DIAGNOSIS — I2510 Atherosclerotic heart disease of native coronary artery without angina pectoris: Secondary | ICD-10-CM | POA: Diagnosis not present

## 2019-12-11 DIAGNOSIS — F3289 Other specified depressive episodes: Secondary | ICD-10-CM

## 2019-12-11 DIAGNOSIS — G479 Sleep disorder, unspecified: Secondary | ICD-10-CM | POA: Diagnosis not present

## 2019-12-11 DIAGNOSIS — R7303 Prediabetes: Secondary | ICD-10-CM | POA: Diagnosis not present

## 2019-12-11 DIAGNOSIS — I4819 Other persistent atrial fibrillation: Secondary | ICD-10-CM | POA: Diagnosis not present

## 2019-12-11 DIAGNOSIS — E7849 Other hyperlipidemia: Secondary | ICD-10-CM | POA: Diagnosis not present

## 2019-12-11 DIAGNOSIS — N184 Chronic kidney disease, stage 4 (severe): Secondary | ICD-10-CM

## 2019-12-11 DIAGNOSIS — I69351 Hemiplegia and hemiparesis following cerebral infarction affecting right dominant side: Secondary | ICD-10-CM | POA: Diagnosis not present

## 2019-12-11 LAB — LIPID PANEL
Cholesterol: 146 mg/dL (ref 0–200)
HDL: 45.9 mg/dL (ref >39.00)
LDL Cholesterol: 70 mg/dL (ref 0–99)
NonHDL: 100.3
Total CHOL/HDL Ratio: 3
Triglycerides: 153 mg/dL — ABNORMAL HIGH (ref 0.0–149.0)
VLDL: 30.6 mg/dL (ref 0.0–40.0)

## 2019-12-11 LAB — COMPREHENSIVE METABOLIC PANEL
ALT: 21 U/L (ref 0–35)
AST: 22 U/L (ref 0–37)
Albumin: 4.4 g/dL (ref 3.5–5.2)
Alkaline Phosphatase: 59 U/L (ref 39–117)
BUN: 28 mg/dL — ABNORMAL HIGH (ref 6–23)
CO2: 26 mEq/L (ref 19–32)
Calcium: 9.7 mg/dL (ref 8.4–10.5)
Chloride: 101 mEq/L (ref 96–112)
Creatinine, Ser: 1.77 mg/dL — ABNORMAL HIGH (ref 0.40–1.20)
GFR: 27.58 mL/min — ABNORMAL LOW (ref >60.00)
Glucose, Bld: 125 mg/dL — ABNORMAL HIGH (ref 70–99)
Potassium: 4.6 mEq/L (ref 3.5–5.1)
Sodium: 137 mEq/L (ref 135–145)
Total Bilirubin: 0.4 mg/dL (ref 0.2–1.2)
Total Protein: 7.3 g/dL (ref 6.0–8.3)

## 2019-12-11 LAB — CBC WITH DIFFERENTIAL/PLATELET
Basophils Absolute: 0 10*3/uL (ref 0.0–0.1)
Basophils Relative: 0.3 % (ref 0.0–3.0)
Eosinophils Absolute: 0.1 10*3/uL (ref 0.0–0.7)
Eosinophils Relative: 0.7 % (ref 0.0–5.0)
HCT: 37.1 % (ref 36.0–46.0)
Hemoglobin: 12.6 g/dL (ref 12.0–15.0)
Lymphocytes Relative: 23.1 % (ref 12.0–46.0)
Lymphs Abs: 2 10*3/uL (ref 0.7–4.0)
MCHC: 34 g/dL (ref 30.0–36.0)
MCV: 94.3 fl (ref 78.0–100.0)
Monocytes Absolute: 0.6 10*3/uL (ref 0.1–1.0)
Monocytes Relative: 6.6 % (ref 3.0–12.0)
Neutro Abs: 6.1 10*3/uL (ref 1.4–7.7)
Neutrophils Relative %: 69.3 % (ref 43.0–77.0)
Platelets: 329 10*3/uL (ref 150.0–400.0)
RBC: 3.94 Mil/uL (ref 3.87–5.11)
RDW: 14.9 % (ref 11.5–15.5)
WBC: 8.7 10*3/uL (ref 4.0–10.5)

## 2019-12-11 LAB — TSH: TSH: 2.33 u[IU]/mL (ref 0.35–4.50)

## 2019-12-11 LAB — HEMOGLOBIN A1C: Hgb A1c MFr Bld: 6.1 % (ref 4.6–6.5)

## 2019-12-11 MED ORDER — CICLOPIROX OLAMINE 0.77 % EX CREA: TOPICAL_CREAM | Freq: Two times a day (BID) | CUTANEOUS | 0 refills | Status: DC

## 2019-12-11 MED ORDER — METOPROLOL TARTRATE 25 MG PO TABS: ORAL_TABLET | ORAL | 1 refills | Status: DC

## 2019-12-11 MED ORDER — FUROSEMIDE 20 MG PO TABS: 10.0000 mg | ORAL_TABLET | ORAL | 1 refills | Status: DC

## 2019-12-11 NOTE — Assessment & Plan Note (Signed)
Chronic  Clinically euthyroid Check tsh  Titrate med dose if needed  

## 2019-12-11 NOTE — Assessment & Plan Note (Signed)
Chronic Check lipid panel  Continue daily statin Regular exercise and healthy diet encouraged  

## 2019-12-11 NOTE — Assessment & Plan Note (Signed)
Chronic Check a1c Low sugar / carb diet Stressed regular exercise  

## 2019-12-11 NOTE — Assessment & Plan Note (Signed)
Chronic Controlled, stable Continue current dose of medication prozac 40 mg daily

## 2019-12-11 NOTE — Assessment & Plan Note (Signed)
Chronic No concerning symptoms of angina On statin, eliquis, BP controlled

## 2019-12-11 NOTE — Assessment & Plan Note (Addendum)
Chronic Follow with Dr Hollie Salk when able Continue lasix 10 mg QOD - can increase if needed but edema fairly controlled cmp

## 2019-12-11 NOTE — Assessment & Plan Note (Signed)
Chronic Mild edema on exam Taking lasix 10 mg QOD Stressed better compliance with low sodium diet Elevate legs Compression socks  Continue current dose of lasix - can increase if needed, but will try to continue at low dose

## 2019-12-11 NOTE — Assessment & Plan Note (Signed)
Chronic Following with cardiology Occasional palpitations On eliquis, metoprolol Cbc, cmp, tsh

## 2019-12-11 NOTE — Assessment & Plan Note (Signed)
Chronic Continue eliquis, statin Doing exercises at home - stressed regular exercise

## 2019-12-11 NOTE — Assessment & Plan Note (Signed)
Chronic BP well controlled Current regimen effective and well tolerated Continue current medications at current doses cmp  

## 2019-12-11 NOTE — Assessment & Plan Note (Signed)
Chronic Controlled continue nortriptyline at current dose

## 2019-12-21 ENCOUNTER — Telehealth: Payer: Self-pay

## 2019-12-21 MED ORDER — FLUOXETINE HCL 40 MG PO CAPS: ORAL_CAPSULE | ORAL | 1 refills | Status: DC

## 2019-12-21 NOTE — Telephone Encounter (Signed)
1.Medication Requested:FLUoxetine (PROZAC) 40 MG capsule  2. Pharmacy (Name, Street, City):[facility] [place], [place]  3. On Med List: Yes   4. Last Visit with PCP: 5.24.21   5. Next visit date with PCP: 11.22.21    Agent: Please be advised that RX refills may take up to 3 business days. We ask that you follow-up with your pharmacy.

## 2019-12-21 NOTE — Telephone Encounter (Signed)
Rx sent 

## 2019-12-29 ENCOUNTER — Other Ambulatory Visit: Payer: Self-pay | Admitting: Internal Medicine

## 2020-01-12 ENCOUNTER — Other Ambulatory Visit: Payer: Self-pay | Admitting: Internal Medicine

## 2020-01-17 ENCOUNTER — Telehealth: Payer: Self-pay | Admitting: Internal Medicine

## 2020-01-17 DIAGNOSIS — N184 Chronic kidney disease, stage 4 (severe): Secondary | ICD-10-CM

## 2020-01-17 MED ORDER — FUROSEMIDE 20 MG PO TABS: 20.0000 mg | ORAL_TABLET | Freq: Every day | ORAL | 1 refills | Status: DC

## 2020-01-17 NOTE — Telephone Encounter (Signed)
Spoke with [name] patient's daughter and lab appointment made.

## 2020-01-17 NOTE — Telephone Encounter (Signed)
   Patient requesting to take Lasix 1 pill a day, instead of 1/2 tablet Request refill to [place], [place]

## 2020-01-17 NOTE — Telephone Encounter (Signed)
Ok - increase to 20 mg daily.  Needs to come in for blood work to check kidney function - if her kidney function worsens she will need to see Dr Hollie Salk.  Schedule lab for Tuesday.

## 2020-01-23 ENCOUNTER — Other Ambulatory Visit: Payer: HMO

## 2020-01-23 ENCOUNTER — Telehealth: Payer: Self-pay | Admitting: Internal Medicine

## 2020-01-23 NOTE — Progress Notes (Signed)
Virtual Visit via telephone Note  I connected with Valerie Mcclure on 01/24/20 at 11:00 AM EDT by telephone and verified that I am speaking with the correct person using two identifiers.   I discussed the limitations of evaluation and management by telemedicine and the availability of in person appointments. The patient expressed understanding and agreed to proceed.  Present for the visit:  Myself, Dr Billey Gosling, Irena Reichmann and her daughter [name].  The patient is currently at home and I am in the office.    No referring provider.    History of Present Illness: She is here for an acute visit for cold symptoms.   Her symptoms started friday  She is experiencing cough and chest congestion.  Cough is better, but she is still coughing.  She also states right ear pain and maybe some shortness of breath.  She has not had any fevers or chills.  She denies headaches.  She has tried taking delsym-her daughter said this was helping, but she feels is not helping that much.  Review of Systems  Constitutional: Negative for chills and fever.  HENT: Positive for ear pain (right ). Negative for congestion, sinus pain and sore throat.   Respiratory: Positive for cough, sputum production and shortness of breath. Negative for wheezing.   Gastrointestinal: Negative for abdominal pain and diarrhea.  Neurological: Negative for headaches.      Social History   Socioeconomic History  . Marital status: Married    Spouse name: Not on file  . Number of children: 1  . Years of education: Not on file  . Highest education level: Not on file  Occupational History  . Occupation: retired  Tobacco Use  . Smoking status: Never Smoker  . Smokeless tobacco: Never Used  Vaping Use  . Vaping Use: Never used  Substance and Sexual Activity  . Alcohol use: No  . Drug use: No  . Sexual activity: Not Currently  Other Topics Concern  . Not on file  Social History Narrative   Married   Social  Determinants of Health   Financial Resource Strain:   . Difficulty of Paying Living Expenses:   Food Insecurity: No Food Insecurity  . Worried About Charity fundraiser in the Last Year: Never true  . Ran Out of Food in the Last Year: Never true  Transportation Needs: No Transportation Needs  . Lack of Transportation (Medical): No  . Lack of Transportation (Non-Medical): No  Physical Activity:   . Days of Exercise per Week:   . Minutes of Exercise per Session:   Stress:   . Feeling of Stress :   Social Connections:   . Frequency of Communication with Friends and Family:   . Frequency of Social Gatherings with Friends and Family:   . Attends Religious Services:   . Active Member of Clubs or Organizations:   . Attends Archivist Meetings:   Marland Kitchen Marital Status:      Assessment and Plan:  See Problem List for Assessment and Plan of chronic medical problems.   Follow Up Instructions:    I discussed the assessment and treatment plan with the patient. The patient was provided an opportunity to ask questions and all were answered. The patient agreed with the plan and demonstrated an understanding of the instructions.   The patient was advised to call back or seek an in-person evaluation if the symptoms worsen or if the condition fails to improve as anticipated.   time spent  on telephone call: 8 minutes  [name], MD

## 2020-01-23 NOTE — Telephone Encounter (Signed)
Ok to set up phone visit.

## 2020-01-23 NOTE — Telephone Encounter (Signed)
New message:   Pt's daughter is calling and states the pt is needing an antibiotic for some congestion and a ear ache she has had for a couple of days. She states she is very sick and had to cancel her lab appt today because of it. Please advise.

## 2020-01-23 NOTE — Telephone Encounter (Signed)
   Patient's daughter calling , states patient has cough, congestion. Declined to schedule virtual appointment; due to lack of technology Patient declined to go to Urgent Care Call transferred to [facility] for immediate advice

## 2020-01-24 ENCOUNTER — Telehealth (INDEPENDENT_AMBULATORY_CARE_PROVIDER_SITE_OTHER): Payer: HMO | Admitting: Internal Medicine

## 2020-01-24 ENCOUNTER — Encounter: Payer: Self-pay | Admitting: Internal Medicine

## 2020-01-24 DIAGNOSIS — J069 Acute upper respiratory infection, unspecified: Secondary | ICD-10-CM | POA: Diagnosis not present

## 2020-01-24 MED ORDER — DOXYCYCLINE HYCLATE 100 MG PO TABS: 100.0000 mg | ORAL_TABLET | Freq: Two times a day (BID) | ORAL | 0 refills | Status: DC

## 2020-01-24 MED ORDER — BENZONATATE 200 MG PO CAPS: 200.0000 mg | ORAL_CAPSULE | Freq: Three times a day (TID) | ORAL | 0 refills | Status: DC | PRN

## 2020-01-24 NOTE — Assessment & Plan Note (Signed)
Acute He has had cold symptoms for several days and given her age and comorbidities there is concern for possible bacterial infection, possibly acute bronchitis It is difficult to say without being able to examine her We will go ahead and start an antibiotic-doxycycline twice daily x10 days Tessalon Perles as needed for cough.  Can use Delsym if that works better Her and her daughter will monitor her symptoms closely and if she is not improving or if her symptoms worsen they will call immediately or take her to urgent care to be seen in person

## 2020-01-25 NOTE — Telephone Encounter (Signed)
[facility] Report Valerie Mcclure : ---Caller states she is calling about her mother who has a cough and congestion and ear ache. No fever. Started Friday  Patient saw PCP

## 2020-01-26 ENCOUNTER — Other Ambulatory Visit: Payer: Self-pay | Admitting: Internal Medicine

## 2020-01-28 ENCOUNTER — Other Ambulatory Visit: Payer: Self-pay | Admitting: Internal Medicine

## 2020-02-14 ENCOUNTER — Telehealth: Payer: Self-pay | Admitting: Internal Medicine

## 2020-02-14 NOTE — Telephone Encounter (Signed)
° °  Patient's daughter calling to report patient is still having ear pain since last virtual visit 7/7. Patient has also developed eye irritation. Patient has completed doxycycline Please advise

## 2020-02-14 NOTE — Telephone Encounter (Signed)
yes

## 2020-02-15 NOTE — Progress Notes (Deleted)
Subjective:    Patient ID: Valerie Mcclure, female    DOB: [birth year], 79 y.o.   MRN: [id number]  HPI The patient is here for an acute visit.  She had a video visit on 7/7 for cold symptoms ( cough, chest congestion, right ear pain, ? SOB) - I started her on doxycyline, which she has completed.       Medications and allergies reviewed with patient and updated if appropriate.  Patient Active Problem List   Diagnosis Date Noted  . URI (upper respiratory infection) 01/24/2020  . Lump in neck 06/08/2018  . Sleep difficulties 03/31/2018  . Persistent atrial fibrillation ([place]) 09/06/2017  . TIA (transient ischemic attack) 07/12/2017  . Bradycardia 04/12/2017  . Depression 01/29/2017  . Fall   . Aphasia as late effect of cerebrovascular accident (CVA)   . Chronic diastolic heart failure ([place])   . Dysarthria, post-stroke   . Atrial flutter ([place]) 12/22/2016  . Spastic hemiplegia of right dominant side as late effect of cerebral infarction ([place])   . Anemia of chronic disease   . Essential hypertension   . Dysphagia, post-stroke   . Anterior cerebral circulation hemorrhagic infarction ([place]) 12/03/2016  . Right hemiparesis ([place])   . Nontraumatic subcortical hemorrhage of left cerebral hemisphere ([place])   . Mild aortic regurgitation 08/20/2016  . Bilateral edema of lower extremity 04/07/2016  . Pancreatic duct dilated 01/09/2016  . Stage 4 chronic kidney disease ([place]) 12/25/2015  . Mild tricuspid regurgitation 12/25/2015  . Mild mitral regurgitation 12/25/2015  . Prediabetes 06/10/2015  . Abdominal pain, chronic, right upper quadrant 06/04/2015  . Chronic low back pain   . Pain in thoracic spine 04/18/2010  . Coronary atherosclerosis 01/24/2009  . DEGENERATIVE JOINT DISEASE 01/23/2009  . ARTHRITIS, LEFT KNEE 12/13/2008  . HERNIATED LUMBAR DISC 12/13/2008  . Hypothyroidism 11/21/2007  . Hyperlipidemia 11/09/2007  . Osteopenia 11/09/2007    Current Outpatient Medications  on File Prior to Visit  Medication Sig Dispense Refill  . ELIQUIS 2.5 MG TABS tablet TAKE 1 TABLET(2.5 MG) BY MOUTH TWICE DAILY 60 tablet 1  . amLODipine (NORVASC) 5 MG tablet Take 1 tablet (5 mg total) by mouth daily. 90 tablet 3  . atorvastatin (LIPITOR) 20 MG tablet Take 1 tablet (20 mg total) by mouth daily. 90 tablet 3  . benzonatate (TESSALON) 200 MG capsule Take 1 capsule (200 mg total) by mouth 3 (three) times daily as needed for cough. 30 capsule 0  . Calcium Carbonate-Vit D-Min (CALCIUM 1200 PO) Take 1 tablet by mouth daily.     . Cholecalciferol (VITAMIN D3) 1000 UNITS CAPS Take 1,000 Units by mouth daily.     . ciclopirox (LOPROX) 0.77 % cream Apply topically 2 (two) times daily. 90 g 0  . doxycycline (VIBRA-TABS) 100 MG tablet Take 1 tablet (100 mg total) by mouth 2 (two) times daily. 20 tablet 0  . FLUoxetine (PROZAC) 40 MG capsule TAKE 1 CAPSULE(40 MG) BY MOUTH DAILY 90 capsule 1  . furosemide (LASIX) 20 MG tablet Take 1 tablet (20 mg total) by mouth daily. 90 tablet 1  . hydrALAZINE (APRESOLINE) 25 MG tablet TAKE 1 TABLET(25 MG) BY MOUTH THREE TIMES DAILY 270 tablet 1  . levothyroxine (SYNTHROID) 75 MCG tablet TAKE 1 TABLET(75 MCG) BY MOUTH DAILY 30 tablet 1  . metoprolol tartrate (LOPRESSOR) 25 MG tablet TAKE 1 TABLET(25 MG) BY MOUTH TWICE DAILY 180 tablet 1  . nortriptyline (PAMELOR) 10 MG capsule TAKE 4 CAPSULES(40 MG) BY MOUTH  AT BEDTIME 360 capsule 0  . polycarbophil (FIBERCON) 625 MG tablet Take 2 tablets (1,250 mg total) by mouth daily. 60 tablet 0  . potassium chloride SA (KLOR-CON) 20 MEQ tablet TAKE 1 TABLET(20 MEQ) BY MOUTH 1 TIME DAILY 90 tablet 1  . vitamin B-12 (CYANOCOBALAMIN) 100 MCG tablet Take 100 mcg by mouth daily.     No current facility-administered medications on file prior to visit.    Past Medical History:  Diagnosis Date  . Blood transfusion without reported diagnosis   . Chronic low back pain   . History of echocardiogram    Echo 5/18: EF 55-60,  Gr 2 DD, mild MR, mild LAE, PASP 47 // Echo 1/18:  EF 55, mild AI, MAC, mild MR, mild LAE, trivial pericardial effusion  . History of ischemic left MCA stroke 11/2016   left MCA infarct status post mechanical thrombectomy complicated by large left basal ganglia hemorrhage with right shift.  Marland Kitchen HTN (hypertension)   . Hyperlipidemia   . Hypothyroidism   . Osteopenia   . Persistent atrial fibrillation ([name])    CHADS2-VASc=6 (female, age 34, HTN, CVA) // Apixaban // rate control strategy     Past Surgical History:  Procedure Laterality Date  . ABDOMINAL HYSTERECTOMY  1970  . CHOLECYSTECTOMY  07/2009   Dr. Rise Patience  . COLONOSCOPY  2003  . FLEXIBLE SIGMOIDOSCOPY  2010  . HAND SURGERY    . IR ANGIO INTRA EXTRACRAN SEL COM CAROTID INNOMINATE UNI L MOD SED  11/29/2016  . IR ANGIO VERTEBRAL SEL SUBCLAVIAN INNOMINATE UNI R MOD SED  11/29/2016  . IR PERCUTANEOUS ART THROMBECTOMY/INFUSION INTRACRANIAL INC DIAG ANGIO  11/29/2016  . IR RADIOLOGIST EVAL & MGMT  03/24/2017  . LUMBAR LAMINECTOMY  11/2008   Done by Dr. Patrice Paradise  . RADIOLOGY WITH ANESTHESIA N/A 11/29/2016   Procedure: RADIOLOGY WITH ANESTHESIA;  Surgeon: Radiologist, Medication, MD;  Location: [facility];  Service: Radiology;  Laterality: N/A;  . THYROIDECTOMY      Social History   Socioeconomic History  . Marital status: Married    Spouse name: Not on file  . Number of children: 1  . Years of education: Not on file  . Highest education level: Not on file  Occupational History  . Occupation: retired  Tobacco Use  . Smoking status: Never Smoker  . Smokeless tobacco: Never Used  Vaping Use  . Vaping Use: Never used  Substance and Sexual Activity  . Alcohol use: No  . Drug use: No  . Sexual activity: Not Currently  Other Topics Concern  . Not on file  Social History Narrative   Married   Social Determinants of Health   Financial Resource Strain:   . Difficulty of Paying Living Expenses:   Food Insecurity: No Food Insecurity  .  Worried About Charity fundraiser in the Last Year: Never true  . Ran Out of Food in the Last Year: Never true  Transportation Needs: No Transportation Needs  . Lack of Transportation (Medical): No  . Lack of Transportation (Non-Medical): No  Physical Activity:   . Days of Exercise per Week:   . Minutes of Exercise per Session:   Stress:   . Feeling of Stress :   Social Connections:   . Frequency of Communication with Friends and Family:   . Frequency of Social Gatherings with Friends and Family:   . Attends Religious Services:   . Active Member of Clubs or Organizations:   . Attends Archivist  Meetings:   Marland Kitchen Marital Status:     Family History  Problem Relation Age of Onset  . Heart disease Father 75       MI age 13s  . Lung cancer Brother 30  . Colon cancer Neg Hx   . Esophageal cancer Neg Hx   . Rectal cancer Neg Hx   . Stomach cancer Neg Hx     Review of Systems     Objective:  There were no vitals filed for this visit. BP Readings from Last 3 Encounters:  12/11/19 (!) 144/72  10/17/19 (!) 146/68  06/13/19 138/68   Wt Readings from Last 3 Encounters:  12/11/19 170 lb (77.1 kg)  10/17/19 171 lb (77.6 kg)  06/13/19 169 lb (76.7 kg)   There is no height or weight on file to calculate BMI.   Physical Exam    GENERAL APPEARANCE: Appears stated age, well appearing, NAD EYES: conjunctiva clear, no icterus HEENT: bilateral tympanic membranes and ear canals normal, oropharynx with mild erythema, no thyromegaly, trachea midline, no cervical or supraclavicular lymphadenopathy LUNGS: Clear to auscultation without wheeze or crackles, unlabored breathing, good air entry bilaterally CARDIOVASCULAR: Normal S1,S2 without murmurs, no edema SKIN: Warm, dry      Assessment & Plan:    See Problem List for Assessment and Plan of chronic medical problems.    This visit occurred during the SARS-CoV-2 public health emergency.  Safety protocols were in place,  including screening questions prior to the visit, additional usage of staff PPE, and extensive cleaning of exam room while observing appropriate contact time as indicated for disinfecting solutions.

## 2020-02-16 ENCOUNTER — Ambulatory Visit: Payer: HMO | Admitting: Internal Medicine

## 2020-02-18 NOTE — Progress Notes (Signed)
Subjective:    Patient ID: Valerie Mcclure, female    DOB: June 27, 1941, 79 y.o.   MRN: [id number]  HPI The patient is here for an acute visit for eye and ear pain.  She is here with her daughter.  She had a video visit on 7/7 for cold symptoms ( cough, chest congestion, right ear pain, ? SOB) - I started her on doxycyline, which she has completed.  Her symptoms improved with the doxycycline.  Her left ear has been stopped up for 3 weeks.  She denies any pain.  She does feel a pop at times.  She states some congestion.  She denies any sinus pain, sore throat, fevers or other cold symptoms.  She denies any worsening with eating or chewing.  Her left eye has been tearing a lot.  Initially it was itchy, but it is not as itchy now.  She denies any changes in vision or sensitivity to light.  She states this started about a week ago.  She has not seen the eye doctor for a while.    Medications and allergies reviewed with patient and updated if appropriate.  Patient Active Problem List   Diagnosis Date Noted  . URI (upper respiratory infection) 01/24/2020  . Lump in neck 06/08/2018  . Sleep difficulties 03/31/2018  . Persistent atrial fibrillation ([place]) 09/06/2017  . TIA (transient ischemic attack) 07/12/2017  . Bradycardia 04/12/2017  . Depression 01/29/2017  . Fall   . Aphasia as late effect of cerebrovascular accident (CVA)   . Chronic diastolic heart failure ([place])   . Dysarthria, post-stroke   . Atrial flutter ([place]) 12/22/2016  . Spastic hemiplegia of right dominant side as late effect of cerebral infarction ([place])   . Anemia of chronic disease   . Essential hypertension   . Dysphagia, post-stroke   . Anterior cerebral circulation hemorrhagic infarction ([place]) 12/03/2016  . Right hemiparesis ([place])   . Nontraumatic subcortical hemorrhage of left cerebral hemisphere ([place])   . Mild aortic regurgitation 08/20/2016  . Bilateral edema of lower extremity 04/07/2016  . Pancreatic  duct dilated 01/09/2016  . Stage 4 chronic kidney disease ([place]) 12/25/2015  . Mild tricuspid regurgitation 12/25/2015  . Mild mitral regurgitation 12/25/2015  . Prediabetes 06/10/2015  . Abdominal pain, chronic, right upper quadrant 06/04/2015  . Chronic low back pain   . Pain in thoracic spine 04/18/2010  . Coronary atherosclerosis 01/24/2009  . DEGENERATIVE JOINT DISEASE 01/23/2009  . ARTHRITIS, LEFT KNEE 12/13/2008  . HERNIATED LUMBAR DISC 12/13/2008  . Hypothyroidism 11/21/2007  . Hyperlipidemia 11/09/2007  . Osteopenia 11/09/2007    Current Outpatient Medications on File Prior to Visit  Medication Sig Dispense Refill  . amLODipine (NORVASC) 5 MG tablet Take 1 tablet (5 mg total) by mouth daily. 90 tablet 3  . atorvastatin (LIPITOR) 20 MG tablet Take 1 tablet (20 mg total) by mouth daily. 90 tablet 3  . benzonatate (TESSALON) 200 MG capsule Take 1 capsule (200 mg total) by mouth 3 (three) times daily as needed for cough. 30 capsule 0  . Calcium Carbonate-Vit D-Min (CALCIUM 1200 PO) Take 1 tablet by mouth daily.     . Cholecalciferol (VITAMIN D3) 1000 UNITS CAPS Take 1,000 Units by mouth daily.     . ciclopirox (LOPROX) 0.77 % cream Apply topically 2 (two) times daily. 90 g 0  . doxycycline (VIBRA-TABS) 100 MG tablet Take 1 tablet (100 mg total) by mouth 2 (two) times daily. 20 tablet 0  . ELIQUIS  2.5 MG TABS tablet TAKE 1 TABLET(2.5 MG) BY MOUTH TWICE DAILY 60 tablet 1  . FLUoxetine (PROZAC) 40 MG capsule TAKE 1 CAPSULE(40 MG) BY MOUTH DAILY 90 capsule 1  . furosemide (LASIX) 20 MG tablet Take 1 tablet (20 mg total) by mouth daily. 90 tablet 1  . hydrALAZINE (APRESOLINE) 25 MG tablet TAKE 1 TABLET(25 MG) BY MOUTH THREE TIMES DAILY 270 tablet 1  . levothyroxine (SYNTHROID) 75 MCG tablet TAKE 1 TABLET(75 MCG) BY MOUTH DAILY 30 tablet 1  . metoprolol tartrate (LOPRESSOR) 25 MG tablet TAKE 1 TABLET(25 MG) BY MOUTH TWICE DAILY 180 tablet 1  . nortriptyline (PAMELOR) 10 MG capsule TAKE  4 CAPSULES(40 MG) BY MOUTH AT BEDTIME 360 capsule 0  . polycarbophil (FIBERCON) 625 MG tablet Take 2 tablets (1,250 mg total) by mouth daily. 60 tablet 0  . potassium chloride SA (KLOR-CON) 20 MEQ tablet TAKE 1 TABLET(20 MEQ) BY MOUTH 1 TIME DAILY 90 tablet 1  . vitamin B-12 (CYANOCOBALAMIN) 100 MCG tablet Take 100 mcg by mouth daily.     No current facility-administered medications on file prior to visit.    Past Medical History:  Diagnosis Date  . Blood transfusion without reported diagnosis   . Chronic low back pain   . History of echocardiogram    Echo 5/18: EF 55-60, Gr 2 DD, mild MR, mild LAE, PASP 47 // Echo 1/18:  EF 55, mild AI, MAC, mild MR, mild LAE, trivial pericardial effusion  . History of ischemic left MCA stroke 11/2016   left MCA infarct status post mechanical thrombectomy complicated by large left basal ganglia hemorrhage with right shift.  Marland Kitchen HTN (hypertension)   . Hyperlipidemia   . Hypothyroidism   . Osteopenia   . Persistent atrial fibrillation ([name])    CHADS2-VASc=6 (female, age 52, HTN, CVA) // Apixaban // rate control strategy     Past Surgical History:  Procedure Laterality Date  . ABDOMINAL HYSTERECTOMY  1970  . CHOLECYSTECTOMY  07/2009   Dr. Rise Patience  . COLONOSCOPY  2003  . FLEXIBLE SIGMOIDOSCOPY  2010  . HAND SURGERY    . IR ANGIO INTRA EXTRACRAN SEL COM CAROTID INNOMINATE UNI L MOD SED  11/29/2016  . IR ANGIO VERTEBRAL SEL SUBCLAVIAN INNOMINATE UNI R MOD SED  11/29/2016  . IR PERCUTANEOUS ART THROMBECTOMY/INFUSION INTRACRANIAL INC DIAG ANGIO  11/29/2016  . IR RADIOLOGIST EVAL & MGMT  03/24/2017  . LUMBAR LAMINECTOMY  11/2008   Done by Dr. Patrice Paradise  . RADIOLOGY WITH ANESTHESIA N/A 11/29/2016   Procedure: RADIOLOGY WITH ANESTHESIA;  Surgeon: Radiologist, Medication, MD;  Location: [facility];  Service: Radiology;  Laterality: N/A;  . THYROIDECTOMY      Social History   Socioeconomic History  . Marital status: Married    Spouse name: Not on file  . Number of  children: 1  . Years of education: Not on file  . Highest education level: Not on file  Occupational History  . Occupation: retired  Tobacco Use  . Smoking status: Never Smoker  . Smokeless tobacco: Never Used  Vaping Use  . Vaping Use: Never used  Substance and Sexual Activity  . Alcohol use: No  . Drug use: No  . Sexual activity: Not Currently  Other Topics Concern  . Not on file  Social History Narrative   Married   Social Determinants of Health   Financial Resource Strain:   . Difficulty of Paying Living Expenses:   Food Insecurity: No Food Insecurity  . Worried  About Running Out of Food in the Last Year: Never true  . Ran Out of Food in the Last Year: Never true  Transportation Needs: No Transportation Needs  . Lack of Transportation (Medical): No  . Lack of Transportation (Non-Medical): No  Physical Activity:   . Days of Exercise per Week:   . Minutes of Exercise per Session:   Stress:   . Feeling of Stress :   Social Connections:   . Frequency of Communication with Friends and Family:   . Frequency of Social Gatherings with Friends and Family:   . Attends Religious Services:   . Active Member of Clubs or Organizations:   . Attends Archivist Meetings:   Marland Kitchen Marital Status:     Family History  Problem Relation Age of Onset  . Heart disease Father 69       MI age 52s  . Lung cancer Brother 58  . Colon cancer Neg Hx   . Esophageal cancer Neg Hx   . Rectal cancer Neg Hx   . Stomach cancer Neg Hx     Review of Systems  Constitutional: Negative for chills and fever.  HENT: Positive for congestion. Negative for ear pain, sinus pain and sore throat.        Left ear clogged, pressure and popping  Eyes: Positive for discharge (tearing) and itching. Negative for photophobia and visual disturbance.  Respiratory: Negative for cough, shortness of breath and wheezing.   Cardiovascular: Negative for chest pain.  Neurological: Positive for light-headedness  (occ). Negative for dizziness and headaches.       Objective:   Vitals:   02/19/20 1413  BP: (!) 148/78  Pulse: 71  Temp: 98.1 F (36.7 C)  SpO2: 98%   BP Readings from Last 3 Encounters:  02/19/20 (!) 148/78  12/11/19 (!) 144/72  10/17/19 (!) 146/68   Wt Readings from Last 3 Encounters:  02/19/20 170 lb (77.1 kg)  12/11/19 170 lb (77.1 kg)  10/17/19 171 lb (77.6 kg)   Body mass index is 29.18 kg/m.   Physical Exam    GENERAL APPEARANCE: Appears stated age, well appearing, NAD EYES: conjunctiva clear, no icterus, no active discharge bilaterally, no eyelid swelling HEENT: bilateral tympanic membranes and ear canals normal, oropharynx with no erythema, slight tenderness with palpation of left TMJ joint, no thyromegaly, trachea midline, no cervical or supraclavicular lymphadenopathy LUNGS: Clear to auscultation without wheeze or crackles, unlabored breathing, good air entry bilaterally CARDIOVASCULAR: Normal S1,S2 without murmurs, no edema SKIN: Warm, dry      Assessment & Plan:    See Problem List for Assessment and Plan of chronic medical problems.    This visit occurred during the SARS-CoV-2 public health emergency.  Safety protocols were in place, including screening questions prior to the visit, additional usage of staff PPE, and extensive cleaning of exam room while observing appropriate contact time as indicated for disinfecting solutions.

## 2020-02-19 ENCOUNTER — Other Ambulatory Visit: Payer: Self-pay

## 2020-02-19 ENCOUNTER — Ambulatory Visit (INDEPENDENT_AMBULATORY_CARE_PROVIDER_SITE_OTHER): Payer: HMO | Admitting: Internal Medicine

## 2020-02-19 ENCOUNTER — Encounter: Payer: Self-pay | Admitting: Internal Medicine

## 2020-02-19 DIAGNOSIS — M26629 Arthralgia of temporomandibular joint, unspecified side: Secondary | ICD-10-CM

## 2020-02-19 DIAGNOSIS — H04202 Unspecified epiphora, left lacrimal gland: Secondary | ICD-10-CM

## 2020-02-19 NOTE — Patient Instructions (Addendum)
For your eye tearing you should see your eye doctor   Your left ear pain is likely related to TMJ or jaw pain.   Rest your jaw.  Do not chew gum.  Try warm or cold compresses.   Follow up with your dentist if your symptoms do not improve.      Temporomandibular Joint Syndrome  Temporomandibular joint syndrome (TMJ syndrome) is a condition that causes pain in the temporomandibular joints. These joints are located near your ears and allow your jaw to open and close. For people with TMJ syndrome, chewing, biting, or other movements of the jaw can be difficult or painful. TMJ syndrome is often mild and goes away within a few weeks. However, sometimes the condition becomes a long-term (chronic) problem. What are the causes? This condition may be caused by:  Grinding your teeth or clenching your jaw. Some people do this when they are under stress.  Arthritis.  Injury to the jaw.  Head or neck injury.  Teeth or dentures that are not aligned well. In some cases, the cause of TMJ syndrome may not be known. What are the signs or symptoms? The most common symptom of this condition is an aching pain on the side of the head in the area of the TMJ. Other symptoms may include:  Pain when moving your jaw, such as when chewing or biting.  Being unable to open your jaw all the way.  Making a clicking sound when you open your mouth.  Headache.  Earache.  Neck or shoulder pain. How is this diagnosed? This condition may be diagnosed based on:  Your symptoms and medical history.  A physical exam. Your health care provider may check the range of motion of your jaw.  Imaging tests, such as X-rays or an MRI. You may also need to see your dentist, who will determine if your teeth and jaw are lined up correctly. How is this treated? TMJ syndrome often goes away on its own. If treatment is needed, the options may include:  Eating soft foods and applying ice or heat.  Medicines to relieve pain  or inflammation.  Medicines or massage to relax the muscles.  A splint, bite plate, or mouthpiece to prevent teeth grinding or jaw clenching.  Relaxation techniques or counseling to help reduce stress.  A therapy for pain in which an electrical current is applied to the nerves through the skin (transcutaneous electrical nerve stimulation).  Acupuncture. This is sometimes helpful to relieve pain.  Jaw surgery. This is rarely needed. Follow these instructions at home:  Eating and drinking  Eat a soft diet if you are having trouble chewing.  Avoid foods that require a lot of chewing. Do not chew gum. General instructions  Take over-the-counter and prescription medicines only as told by your health care provider.  If directed, put ice on the painful area. ? Put ice in a plastic bag. ? Place a towel between your skin and the bag. ? Leave the ice on for 20 minutes, 2-3 times a day.  Apply a warm, wet cloth (warm compress) to the painful area as directed.  Massage your jaw area and do any jaw stretching exercises as told by your health care provider.  If you were given a splint, bite plate, or mouthpiece, wear it as told by your health care provider.  Keep all follow-up visits as told by your health care provider. This is important. Contact a health care provider if:  You are having trouble eating.  You have new or worsening symptoms. Get help right away if:  Your jaw locks open or closed. Summary  Temporomandibular joint syndrome (TMJ syndrome) is a condition that causes pain in the temporomandibular joints. These joints are located near your ears and allow your jaw to open and close.  TMJ syndrome is often mild and goes away within a few weeks. However, sometimes the condition becomes a long-term (chronic) problem.  Symptoms include an aching pain on the side of the head in the area of the TMJ, pain when chewing or biting, and being unable to open your jaw all the way.  You may also make a clicking sound when you open your mouth.  TMJ syndrome often goes away on its own. If treatment is needed, it may include medicines to relieve pain, reduce inflammation, or relax the muscles. A splint, bite plate, or mouthpiece may also be used to prevent teeth grinding or jaw clenching. This information is not intended to replace advice given to you by your health care provider. Make sure you discuss any questions you have with your health care provider. Document Revised: 09/17/2017 Document Reviewed: 08/17/2017 Elsevier Patient Education  2020 [employer].

## 2020-02-19 NOTE — Assessment & Plan Note (Signed)
Acute Ear pain she is experiencing is the likely TMJ She does have some tenderness at the TMJ joint on the left She is chewing gum now and that could be contributing Advised fall rest, not chewing gum, warm/cold compresses, Tylenol as needed Advised to see dentist if symptoms do not improve

## 2020-02-19 NOTE — Assessment & Plan Note (Signed)
Acute Started about 1 week ago No obvious conjunctivitis or other infection Discharge is clear and appears to be 2 years ?  Dry eye syndrome versus other Advised to see eye doctor

## 2020-02-22 ENCOUNTER — Other Ambulatory Visit: Payer: Self-pay

## 2020-02-22 NOTE — Patient Outreach (Signed)
[facility]) Care Management Chronic Special Needs Program  02/22/2020  Name: Valerie Mcclure DOB: 02/11/1941  MRN: [id number]  Valerie Mcclure is enrolled in a chronic special needs plan for Heart Failure. Reviewed and updated care plan. Spoke with daughter Valerie Mcclure Designated Party Release due to expressive aphasia HIPAA verified  Subjective: States client has been doing good.  States she is weighing everyday and her weights have been about the same around 159.  Denies any shortness of breath or chest pains.  Denies any recent falls and states she uses her walker.  States she is doing her exercise that physical therapy taught her daily.  States she is following a low salt diet.  States her B/P is good when it is checked and they check it at home about once a week or so.  States she has gotten both of her COVID shots.  Denies any issues with affording her medications and daughter states she helps client keep up with her pills.  States she has not gotten her Advanced Directives done but they have the forms.  Goals Addressed            This Visit's Progress    COMPLETED: Client understands the importance of follow-up with providers by attending scheduled visits   On track    Keeping scheduled appointments with providers Reinforced to keep scheduled appointments with providers Goal completed 02/22/20     Client verbalize knowledge of Heart Failure disease self management skills by next 6 months(continue 02/22/20)   On track    Signs and symptoms of heart failure reviewed Heart failure zones reviewed  Advised to notify doctor for symptoms Call 911 for severe symptoms Weigh daily and record weighs Follow a low salt diet      Client will not report change from baseline and no repeated symptoms of stroke with in the next 6 months(continue 02/22/20)   On track    Reports no stroke symptoms Reviewed stoke symptoms and when to call 911     Client will report  no fall or injuries in the next 6 months(continue 02/22/20)   On track    Reports no falls Encouraged to do  physical therapy exercises Reinforced fall precautions and home safety Stand up slowly Use cane or walker at all times Use grabber to pick up objects on the floor Keep home well-lit and wear your glasses       Client will report no worsening of symptoms of Atrial Fibrillation within the next 50months(continue 02/22/20)   On track    Plan to take anticoagulant as ordered to prevent stroke Maintaining timely refills per dispense report Please review atrial fibrillation action plan in [facility](HTA) calendar     Client will report the importance of monitoring weight for Heart Failure management as evidenced by verbalizing signs of exacerbation(continue 02/22/20.   On track    Reports weighing daily Reinforced to record daily weights  It is important to weigh daily and write down weights. Pay attention to your body if you have any shortness of breath, swelling in your feet, ankles, legs or your waistband gets tight.  Call your provider if you gain 3 pounds overnight or gain 5 pounds in a week     Client will verbalize knowledge of self management of Hypertension as evidences by BP reading of 140/90 or less; or as defined by provider   On track    Reports B/P less than 140/90 when checked at home  and providers visits Plan to check B/P regularly Take B/P medications as ordered Plan to follow a low salt diet  Increase activity as tolerated     Obtain annual  Lipid Profile, LDL-C   On track    Last completed 06/13/19 LDL 65 The goal for LDL is less than 70 mg/dL as you are at high risk for complications Try to avoid saturated fats, trans-fats and eat more fiber Plan to take statin as ordered      Patient Stated   On track    Maintain current health status. Completed physical therapy  Plan to continue to do physical therapy exercises     Visit Primary Care Provider or  Cardiologist at least 2 times per year   On track    Last Annual Wellness visit 06/13/19 Primary care provider visits 12/11/19, 02/19/20 [facility] provider to do initial visit on 02/27/20      Client not engaged with [facility] but is scheduled for initial visit with [facility] on 02/27/20  Plan:  Send successful outreach letter with a copy of their individualized care plan and Send individual care plan to provider  Chronic care management coordinator will review [facility]'s plan of care and will collaborate with [facility] team as indicated in 6 Months  Client will be outreached by a [facility] ([facility]) RNCM in 6 months per tier level     Peter Garter RN, Jackquline Denmark, [place] Management Coordinator [place] Management [phone]

## 2020-03-03 ENCOUNTER — Other Ambulatory Visit: Payer: Self-pay | Admitting: Internal Medicine

## 2020-03-05 ENCOUNTER — Ambulatory Visit: Payer: HMO

## 2020-03-11 ENCOUNTER — Other Ambulatory Visit: Payer: Self-pay | Admitting: Internal Medicine

## 2020-03-14 ENCOUNTER — Other Ambulatory Visit: Payer: Self-pay | Admitting: Internal Medicine

## 2020-03-16 ENCOUNTER — Other Ambulatory Visit: Payer: Self-pay | Admitting: Internal Medicine

## 2020-03-20 ENCOUNTER — Other Ambulatory Visit: Payer: Self-pay | Admitting: Internal Medicine

## 2020-03-27 ENCOUNTER — Other Ambulatory Visit: Payer: Self-pay | Admitting: Internal Medicine

## 2020-04-12 ENCOUNTER — Other Ambulatory Visit: Payer: Self-pay | Admitting: Internal Medicine

## 2020-04-24 ENCOUNTER — Telehealth: Payer: Self-pay | Admitting: Internal Medicine

## 2020-04-24 NOTE — Telephone Encounter (Signed)
Patient's daughter is calling and states the patient is requesting to go on her Lasix. She does not feel like she is getting enough fluid out.   Patient is requesting a refill also on the following medication.  ciclopirox (LOPROX) 0.77 % cream   Please follow up with daughter [name].

## 2020-04-25 MED ORDER — CICLOPIROX OLAMINE 0.77 % EX CREA: TOPICAL_CREAM | Freq: Two times a day (BID) | CUTANEOUS | 2 refills | Status: DC

## 2020-04-25 MED ORDER — FUROSEMIDE 20 MG PO TABS: 20.0000 mg | ORAL_TABLET | Freq: Every day | ORAL | 1 refills | Status: DC

## 2020-04-25 NOTE — Telephone Encounter (Signed)
Spoke with daughter today 

## 2020-04-25 NOTE — Telephone Encounter (Signed)
Lasix and ciclopirox sent to pharmacy

## 2020-05-14 ENCOUNTER — Other Ambulatory Visit: Payer: Self-pay

## 2020-05-14 ENCOUNTER — Encounter: Payer: Self-pay | Admitting: Cardiovascular Disease

## 2020-05-14 ENCOUNTER — Ambulatory Visit: Payer: HMO | Admitting: Cardiovascular Disease

## 2020-05-14 VITALS — BP 128/66 | HR 73 | Ht 64.0 in | Wt 171.4 lb

## 2020-05-14 DIAGNOSIS — I4892 Unspecified atrial flutter: Secondary | ICD-10-CM

## 2020-05-14 DIAGNOSIS — I5032 Chronic diastolic (congestive) heart failure: Secondary | ICD-10-CM

## 2020-05-14 DIAGNOSIS — I10 Essential (primary) hypertension: Secondary | ICD-10-CM

## 2020-05-14 DIAGNOSIS — I4819 Other persistent atrial fibrillation: Secondary | ICD-10-CM | POA: Diagnosis not present

## 2020-05-14 NOTE — Patient Instructions (Signed)
Medication Instructions:  Your physician recommends that you continue on your current medications as directed. Please refer to the Current Medication list given to you today. *If you need a refill on your cardiac medications before your next appointment, please call your pharmacy*  Lab Work: If you have labs (blood work) drawn today and your tests are completely normal, you will receive your results only by: Marland Kitchen MyChart Message (if you have MyChart) OR . A paper copy in the mail If you have any lab test that is abnormal or we need to change your treatment, we will call you to review the results.  Follow-Up: At [facility], you and your health needs are our priority.  As part of our continuing mission to provide you with exceptional heart care, we have created designated Provider Care Teams.  These Care Teams include your primary Cardiologist (physician) and Advanced Practice Providers (APPs -  Physician Assistants and Nurse Practitioners) who all work together to provide you with the care you need, when you need it.  We recommend signing up for the patient portal called "MyChart".  Sign up information is provided on this After Visit Summary.  MyChart is used to connect with patients for Virtual Visits (Telemedicine).  Patients are able to view lab/test results, encounter notes, upcoming appointments, etc.  Non-urgent messages can be sent to your provider as well.   To learn more about what you can do with MyChart, go to [email or web address].    Your next appointment:   1 year(s)  The format for your next appointment:   In Person  Provider:   You will see one of the following Advanced Practice Providers on your designated Care Team:    Richardson Dopp, [place]

## 2020-05-14 NOTE — Progress Notes (Signed)
Cardiology Office Note:    Date:  05/14/2020   ID:  Valerie Mcclure, DOB May 22, 1941, MRN [id number]  PCP:  [name], MD  [facility] Cardiologist:  Mertie Moores, MD  [place] Electrophysiologist:  None   Referring MD: [name], MD   Chief Complaint  Patient presents with  . Atrial Fibrillation    Oct. 26, 2021   Valerie Mcclure is a 79 y.o. female with a hx of atrial fib, HTN, hyyperlipidemia, prior stroke  I met her during a hospitalization in May 2018 when she was admitted with a stroke. Tele revealed PACs and PVCs but no afib. She was readmitted with opiate overdose in June , 2018 and was iin atrial futter at the time .  Was started on Eliquis  She left the hospital AMA Eventually followed up in the afib clinic in 2019  CHADS2-VASc=6 (female, age 68, HTN, CVA).  Was seen by Richardson Dopp in Feb. 2019  Seen with daughter, [name]  No cp, no dyspnea  Has recovered from her stroke   Remains fairly active.  Still eats salty foods.      Past Medical History:  Diagnosis Date  . Blood transfusion without reported diagnosis   . Chronic low back pain   . History of echocardiogram    Echo 5/18: EF 55-60, Gr 2 DD, mild MR, mild LAE, PASP 47 // Echo 1/18:  EF 55, mild AI, MAC, mild MR, mild LAE, trivial pericardial effusion  . History of ischemic left MCA stroke 11/2016   left MCA infarct status post mechanical thrombectomy complicated by large left basal ganglia hemorrhage with right shift.  Marland Kitchen HTN (hypertension)   . Hyperlipidemia   . Hypothyroidism   . Osteopenia   . Persistent atrial fibrillation ([name])    CHADS2-VASc=6 (female, age 61, HTN, CVA) // Apixaban // rate control strategy     Past Surgical History:  Procedure Laterality Date  . ABDOMINAL HYSTERECTOMY  1970  . CHOLECYSTECTOMY  07/2009   Dr. Rise Patience  . COLONOSCOPY  2003  . FLEXIBLE SIGMOIDOSCOPY  2010  . HAND SURGERY    . IR ANGIO INTRA EXTRACRAN SEL COM CAROTID  INNOMINATE UNI L MOD SED  11/29/2016  . IR ANGIO VERTEBRAL SEL SUBCLAVIAN INNOMINATE UNI R MOD SED  11/29/2016  . IR PERCUTANEOUS ART THROMBECTOMY/INFUSION INTRACRANIAL INC DIAG ANGIO  11/29/2016  . IR RADIOLOGIST EVAL & MGMT  03/24/2017  . LUMBAR LAMINECTOMY  11/2008   Done by Dr. Patrice Paradise  . RADIOLOGY WITH ANESTHESIA N/A 11/29/2016   Procedure: RADIOLOGY WITH ANESTHESIA;  Surgeon: Radiologist, Medication, MD;  Location: [place];  Service: Radiology;  Laterality: N/A;  . THYROIDECTOMY      Current Medications: Current Meds  Medication Sig  . amLODipine (NORVASC) 5 MG tablet Take 1 tablet (5 mg total) by mouth daily.  Marland Kitchen atorvastatin (LIPITOR) 20 MG tablet Take 1 tablet (20 mg total) by mouth daily.  . benzonatate (TESSALON) 200 MG capsule Take 1 capsule (200 mg total) by mouth 3 (three) times daily as needed for cough.  . Calcium Carbonate-Vit D-Min (CALCIUM 1200 PO) Take 1 tablet by mouth daily.   . Cholecalciferol (VITAMIN D3) 1000 UNITS CAPS Take 1,000 Units by mouth daily.   . ciclopirox (LOPROX) 0.77 % cream Apply topically 2 (two) times daily.  Marland Kitchen ELIQUIS 2.5 MG TABS tablet TAKE 1 TABLET(2.5 MG) BY MOUTH TWICE DAILY  . FLUoxetine (PROZAC) 40 MG capsule TAKE 1 CAPSULE(40 MG) BY MOUTH DAILY  .  furosemide (LASIX) 20 MG tablet Take 1 tablet (20 mg total) by mouth daily.  . hydrALAZINE (APRESOLINE) 25 MG tablet TAKE 1 TABLET(25 MG) BY MOUTH THREE TIMES DAILY  . levothyroxine (SYNTHROID) 75 MCG tablet Take 1 tablet (75 mcg total) by mouth daily before breakfast. Follow-up appt due in Nov must see provider for future refills  . metoprolol tartrate (LOPRESSOR) 25 MG tablet TAKE 1 TABLET(25 MG) BY MOUTH TWICE DAILY  . nortriptyline (PAMELOR) 10 MG capsule TAKE 4 CAPSULES(40 MG) BY MOUTH AT BEDTIME  . potassium chloride SA (KLOR-CON) 20 MEQ tablet TAKE 1 TABLET(20 MEQ) BY MOUTH 1 TIME DAILY  . vitamin B-12 (CYANOCOBALAMIN) 100 MCG tablet Take 100 mcg by mouth daily.     Allergies:   Shrimp [shellfish  allergy], Valsartan, Tandem plus [fefum-fepo-fa-b cmp-c-zn-mn-cu], Ivp dye [iodinated diagnostic agents], and Penicillins   Social History   Socioeconomic History  . Marital status: Married    Spouse name: Not on file  . Number of children: 1  . Years of education: Not on file  . Highest education level: Not on file  Occupational History  . Occupation: retired  Tobacco Use  . Smoking status: Never Smoker  . Smokeless tobacco: Never Used  Vaping Use  . Vaping Use: Never used  Substance and Sexual Activity  . Alcohol use: No  . Drug use: No  . Sexual activity: Not Currently  Other Topics Concern  . Not on file  Social History Narrative   Married   Social Determinants of Health   Financial Resource Strain:   . Difficulty of Paying Living Expenses: Not on file  Food Insecurity: No Food Insecurity  . Worried About Charity fundraiser in the Last Year: Never true  . Ran Out of Food in the Last Year: Never true  Transportation Needs: No Transportation Needs  . Lack of Transportation (Medical): No  . Lack of Transportation (Non-Medical): No  Physical Activity:   . Days of Exercise per Week: Not on file  . Minutes of Exercise per Session: Not on file  Stress:   . Feeling of Stress : Not on file  Social Connections:   . Frequency of Communication with Friends and Family: Not on file  . Frequency of Social Gatherings with Friends and Family: Not on file  . Attends Religious Services: Not on file  . Active Member of Clubs or Organizations: Not on file  . Attends Archivist Meetings: Not on file  . Marital Status: Not on file     Family History: The patient's family history includes Heart disease (age of onset: 9) in her father; Lung cancer (age of onset: 43) in her brother. There is no history of Colon cancer, Esophageal cancer, Rectal cancer, or Stomach cancer.  ROS:   Please see the history of present illness.     All other systems reviewed and are  negative.  EKGs/Labs/Other Studies Reviewed:    The following studies were reviewed today:   EKG: May 14, 2020: Normal sinus rhythm with first-degree AV block.  Heart rate is 73.  Left bundle branch block.  Recent Labs: 12/11/2019: ALT 21; BUN 28; Creatinine, Ser 1.77; Hemoglobin 12.6; Platelets 329.0; Potassium 4.6; Sodium 137; TSH 2.33  Recent Lipid Panel    Component Value Date/Time   CHOL 146 12/11/2019 1449   TRIG 153.0 (H) 12/11/2019 1449   HDL 45.90 12/11/2019 1449   CHOLHDL 3 12/11/2019 1449   VLDL 30.6 12/11/2019 1449   LDLCALC 70 12/11/2019  1449   LDLDIRECT 82.0 06/08/2018 1417     Risk Assessment/Calculations:     CHA2DS2-VASc Score = 6 6 This indicates a 9.7% annual risk of stroke. The patient's score is based upon: CHF History: 0 HTN History: 1 Diabetes History: 0 Stroke History: 2 Vascular Disease History: 0 Age Score: 2 Gender Score: 1    Physical Exam:    VS:  BP 128/66   Pulse 73   Ht _0  (1.626 m)   Wt 171 lb 6.4 oz (77.7 kg)   SpO2 98%   BMI 29.42 kg/m     Wt Readings from Last 3 Encounters:  05/14/20 171 lb 6.4 oz (77.7 kg)  02/19/20 170 lb (77.1 kg)  12/11/19 170 lb (77.1 kg)     GEN:   Elderly female,  Examined in her chair  HEENT: Normal NECK: No JVD; No carotid bruits LYMPHATICS: No lymphadenopathy CARDIAC: RRR, no murmurs, rubs, gallops RESPIRATORY:  Clear to auscultation without rales, wheezing or rhonchi  ABDOMEN: Soft, non-tender, non-distended MUSCULOSKELETAL:  No edema; No deformity  SKIN: Warm and dry NEUROLOGIC:  Alert and oriented x 3 PSYCHIATRIC:  Normal affect   ASSESSMENT:    1. Essential hypertension   2. Persistent atrial fibrillation ([name])    PLAN:    In order of problems listed above:  1. Atrial flutter:   Remains in sinus rhythm today.  Continue Eliquis 2.5 g twice a day.  2.  Hypertension: I have asked her to reduce her salt intake.  She still eats a lot of salty foods.  3.  Prediabetes: Her  last glucose level was mildly elevated.  I encouraged her to reduce her intake of carbs and sweets.  She will follow-up with her primary medical doctor.  4.  Moderate renal insufficiency: Follow-up with Dr. Quay Burow.  5.  LBBB :  Had normal LV function in 2018.  Will repeat echo in a year.     Medication Adjustments/Labs and Tests Ordered: Current medicines are reviewed at length with the patient today.  Concerns regarding medicines are outlined above.  Orders Placed This Encounter  Procedures  . EKG 12-Lead   No orders of the defined types were placed in this encounter.   Patient Instructions  Medication Instructions:  Your physician recommends that you continue on your current medications as directed. Please refer to the Current Medication list given to you today. *If you need a refill on your cardiac medications before your next appointment, please call your pharmacy*  Lab Work: If you have labs (blood work) drawn today and your tests are completely normal, you will receive your results only by: Marland Kitchen MyChart Message (if you have MyChart) OR . A paper copy in the mail If you have any lab test that is abnormal or we need to change your treatment, we will call you to review the results.  Follow-Up: At [facility], you and your health needs are our priority.  As part of our continuing mission to provide you with exceptional heart care, we have created designated Provider Care Teams.  These Care Teams include your primary Cardiologist (physician) and Advanced Practice Providers (APPs -  Physician Assistants and Nurse Practitioners) who all work together to provide you with the care you need, when you need it.  We recommend signing up for the patient portal called "MyChart".  Sign up information is provided on this After Visit Summary.  MyChart is used to connect with patients for Virtual Visits (Telemedicine).  Patients are able  to view lab/test results, encounter notes, upcoming  appointments, etc.  Non-urgent messages can be sent to your provider as well.   To learn more about what you can do with MyChart, go to [email or web address].    Your next appointment:   1 year(s)  The format for your next appointment:   In Person  Provider:   You will see one of the following Advanced Practice Providers on your designated Care Team:    Richardson Dopp, PA-C       Signed, Mertie Moores, MD  05/14/2020 9:42 AM    [facility]

## 2020-05-26 ENCOUNTER — Other Ambulatory Visit: Payer: Self-pay | Admitting: Internal Medicine

## 2020-06-03 ENCOUNTER — Other Ambulatory Visit: Payer: Self-pay

## 2020-06-03 NOTE — Patient Outreach (Signed)
  [facility]) Care Management Chronic Special Needs Program    06/03/2020  Name: Valerie Mcclure, DOB: 1941/03/29  MRN: [id number]   [name] is enrolled in a chronic special needs plan for Heart Failure.  [facility] will continue to provide services for this client through 07/19/2020. The [facility] care management team will assume care 07/20/2020.  Peter Garter RN, Jackquline Denmark, CDE Chronic Care Management Coordinator [facility] [phone]

## 2020-06-09 NOTE — Patient Instructions (Addendum)
  Blood work was ordered.     Flu immunization administered today.     Medications changes include :   none     A referral was ordered for the kidney doctor.        Someone from their office will call you to schedule an appointment.    Please followup in 6 months

## 2020-06-09 NOTE — Progress Notes (Signed)
Subjective:    Patient ID: Valerie Mcclure, female    DOB: [birth year], 79 y.o.   MRN: [id number]  HPI The patient is here for follow up of their chronic medical problems, including CAD, Afib, htn, hyperlipidemia, hypothyroidism, CKD, h/o CVA w dysphasia and R sided weakness, depression, prediabetes, chronic RUQ pain from thoracic neuropathy, insomnia and LE edema   She is exercising regularly.     She has no concerns and denies changes since she was here last.   Medications and allergies reviewed with patient and updated if appropriate.  Patient Active Problem List   Diagnosis Date Noted  . Eye tearing, left 02/19/2020  . TMJ syndrome, left 02/19/2020  . Lump in neck 06/08/2018  . Sleep difficulties 03/31/2018  . Persistent atrial fibrillation ([place]) 09/06/2017  . TIA (transient ischemic attack) 07/12/2017  . Bradycardia 04/12/2017  . Depression 01/29/2017  . Fall   . Aphasia as late effect of cerebrovascular accident (CVA)   . Chronic diastolic heart failure ([place])   . Dysarthria, post-stroke   . Atrial flutter ([place]) 12/22/2016  . Spastic hemiplegia of right dominant side as late effect of cerebral infarction ([place])   . Anemia of chronic disease   . Essential hypertension   . Dysphagia, post-stroke   . Anterior cerebral circulation hemorrhagic infarction ([place]) 12/03/2016  . Right hemiparesis ([place])   . Nontraumatic subcortical hemorrhage of left cerebral hemisphere ([place])   . Mild aortic regurgitation 08/20/2016  . Bilateral edema of lower extremity 04/07/2016  . Pancreatic duct dilated 01/09/2016  . Stage 4 chronic kidney disease ([place]) 12/25/2015  . Mild tricuspid regurgitation 12/25/2015  . Mild mitral regurgitation 12/25/2015  . Prediabetes 06/10/2015  . Abdominal pain, chronic, right upper quadrant 06/04/2015  . Chronic low back pain   . Pain in thoracic spine 04/18/2010  . Coronary atherosclerosis 01/24/2009  . ARTHRITIS, LEFT KNEE 12/13/2008  . Hypothyroidism  11/21/2007  . Hyperlipidemia 11/09/2007  . Osteopenia 11/09/2007    Current Outpatient Medications on File Prior to Visit  Medication Sig Dispense Refill  . amLODipine (NORVASC) 5 MG tablet Take 1 tablet (5 mg total) by mouth daily. 90 tablet 3  . atorvastatin (LIPITOR) 20 MG tablet Take 1 tablet (20 mg total) by mouth daily. 90 tablet 3  . benzonatate (TESSALON) 200 MG capsule Take 1 capsule (200 mg total) by mouth 3 (three) times daily as needed for cough. 30 capsule 0  . Calcium Carbonate-Vit D-Min (CALCIUM 1200 PO) Take 1 tablet by mouth daily.     . Cholecalciferol (VITAMIN D3) 1000 UNITS CAPS Take 1,000 Units by mouth daily.     . ciclopirox (LOPROX) 0.77 % cream Apply topically 2 (two) times daily. 90 g 2  . ELIQUIS 2.5 MG TABS tablet TAKE 1 TABLET(2.5 MG) BY MOUTH TWICE DAILY 60 tablet 1  . FLUoxetine (PROZAC) 40 MG capsule TAKE 1 CAPSULE(40 MG) BY MOUTH DAILY 90 capsule 1  . furosemide (LASIX) 20 MG tablet Take 1 tablet (20 mg total) by mouth daily. 90 tablet 1  . hydrALAZINE (APRESOLINE) 25 MG tablet TAKE 1 TABLET(25 MG) BY MOUTH THREE TIMES DAILY 270 tablet 1  . levothyroxine (SYNTHROID) 75 MCG tablet Take 1 tablet (75 mcg total) by mouth daily before breakfast. Follow-up appt due in Nov must see provider for future refills 30 tablet 2  . metoprolol tartrate (LOPRESSOR) 25 MG tablet TAKE 1 TABLET(25 MG) BY MOUTH TWICE DAILY 180 tablet 1  . nortriptyline (PAMELOR) 10 MG capsule TAKE  4 CAPSULES(40 MG) BY MOUTH AT BEDTIME 360 capsule 0  . potassium chloride SA (KLOR-CON) 20 MEQ tablet TAKE 1 TABLET(20 MEQ) BY MOUTH 1 TIME DAILY 90 tablet 1  . vitamin B-12 (CYANOCOBALAMIN) 100 MCG tablet Take 100 mcg by mouth daily.     No current facility-administered medications on file prior to visit.    Past Medical History:  Diagnosis Date  . Blood transfusion without reported diagnosis   . Chronic low back pain   . History of echocardiogram    Echo 5/18: EF 55-60, Gr 2 DD, mild MR, mild  LAE, PASP 47 // Echo 1/18:  EF 55, mild AI, MAC, mild MR, mild LAE, trivial pericardial effusion  . History of ischemic left MCA stroke 11/2016   left MCA infarct status post mechanical thrombectomy complicated by large left basal ganglia hemorrhage with right shift.  Marland Kitchen HTN (hypertension)   . Hyperlipidemia   . Hypothyroidism   . Osteopenia   . Persistent atrial fibrillation ([name])    CHADS2-VASc=6 (female, age 60, HTN, CVA) // Apixaban // rate control strategy     Past Surgical History:  Procedure Laterality Date  . ABDOMINAL HYSTERECTOMY  1970  . CHOLECYSTECTOMY  07/2009   Dr. Rise Patience  . COLONOSCOPY  2003  . FLEXIBLE SIGMOIDOSCOPY  2010  . HAND SURGERY    . IR ANGIO INTRA EXTRACRAN SEL COM CAROTID INNOMINATE UNI L MOD SED  11/29/2016  . IR ANGIO VERTEBRAL SEL SUBCLAVIAN INNOMINATE UNI R MOD SED  11/29/2016  . IR PERCUTANEOUS ART THROMBECTOMY/INFUSION INTRACRANIAL INC DIAG ANGIO  11/29/2016  . IR RADIOLOGIST EVAL & MGMT  03/24/2017  . LUMBAR LAMINECTOMY  11/2008   Done by Dr. Patrice Paradise  . RADIOLOGY WITH ANESTHESIA N/A 11/29/2016   Procedure: RADIOLOGY WITH ANESTHESIA;  Surgeon: Radiologist, Medication, MD;  Location: [facility];  Service: Radiology;  Laterality: N/A;  . THYROIDECTOMY      Social History   Socioeconomic History  . Marital status: Married    Spouse name: Not on file  . Number of children: 1  . Years of education: Not on file  . Highest education level: Not on file  Occupational History  . Occupation: retired  Tobacco Use  . Smoking status: Never Smoker  . Smokeless tobacco: Never Used  Vaping Use  . Vaping Use: Never used  Substance and Sexual Activity  . Alcohol use: No  . Drug use: No  . Sexual activity: Not Currently  Other Topics Concern  . Not on file  Social History Narrative   Married   Social Determinants of Health   Financial Resource Strain:   . Difficulty of Paying Living Expenses: Not on file  Food Insecurity: No Food Insecurity  . Worried About  Charity fundraiser in the Last Year: Never true  . Ran Out of Food in the Last Year: Never true  Transportation Needs: No Transportation Needs  . Lack of Transportation (Medical): No  . Lack of Transportation (Non-Medical): No  Physical Activity:   . Days of Exercise per Week: Not on file  . Minutes of Exercise per Session: Not on file  Stress:   . Feeling of Stress : Not on file  Social Connections:   . Frequency of Communication with Friends and Family: Not on file  . Frequency of Social Gatherings with Friends and Family: Not on file  . Attends Religious Services: Not on file  . Active Member of Clubs or Organizations: Not on file  . Attends Club  or Organization Meetings: Not on file  . Marital Status: Not on file    Family History  Problem Relation Age of Onset  . Heart disease Father 57       MI age 24s  . Lung cancer Brother 34  . Colon cancer Neg Hx   . Esophageal cancer Neg Hx   . Rectal cancer Neg Hx   . Stomach cancer Neg Hx     Review of Systems  Constitutional: Negative for chills and fever.  Respiratory: Negative for cough, shortness of breath and wheezing.   Cardiovascular: Positive for leg swelling. Negative for chest pain and palpitations.  Gastrointestinal: Positive for abdominal pain (from back). Negative for nausea.       No gerd  Neurological: Negative for dizziness, light-headedness and headaches.  Psychiatric/Behavioral: Positive for dysphoric mood. The patient is not nervous/anxious.        Objective:   Vitals:   06/10/20 1306  BP: 130/68  Pulse: 62  Temp: 98 F (36.7 C)  SpO2: 99%   BP Readings from Last 3 Encounters:  06/10/20 130/68  05/14/20 128/66  02/19/20 (!) 148/78   Wt Readings from Last 3 Encounters:  06/10/20 168 lb (76.2 kg)  05/14/20 171 lb 6.4 oz (77.7 kg)  02/19/20 170 lb (77.1 kg)   Body mass index is 28.84 kg/m.   Physical Exam    Constitutional: Appears well-developed and well-nourished. No distress.  HENT:    Head: Normocephalic and atraumatic.  Neck: Neck supple. No tracheal deviation present. No thyromegaly present.  No cervical lymphadenopathy Cardiovascular: Normal rate, irregular rhythm and normal heart sounds.   No murmur heard. No carotid bruit .  Trace b/l LE non-pitting edema Pulmonary/Chest: Effort normal and breath sounds normal. No respiratory distress. No has no wheezes. No rales.  Skin: Skin is warm and dry. Not diaphoretic.  Psychiatric: Normal mood and affect. Behavior is normal.      Assessment & Plan:    See Problem List for Assessment and Plan of chronic medical problems.    This visit occurred during the SARS-CoV-2 public health emergency.  Safety protocols were in place, including screening questions prior to the visit, additional usage of staff PPE, and extensive cleaning of exam room while observing appropriate contact time as indicated for disinfecting solutions.

## 2020-06-10 ENCOUNTER — Encounter: Payer: Self-pay | Admitting: Internal Medicine

## 2020-06-10 ENCOUNTER — Ambulatory Visit (INDEPENDENT_AMBULATORY_CARE_PROVIDER_SITE_OTHER): Payer: HMO | Admitting: Internal Medicine

## 2020-06-10 ENCOUNTER — Other Ambulatory Visit: Payer: Self-pay

## 2020-06-10 VITALS — BP 130/68 | HR 62 | Temp 98.0°F | Ht 64.0 in | Wt 168.0 lb

## 2020-06-10 DIAGNOSIS — R7303 Prediabetes: Secondary | ICD-10-CM

## 2020-06-10 DIAGNOSIS — R600 Localized edema: Secondary | ICD-10-CM | POA: Diagnosis not present

## 2020-06-10 DIAGNOSIS — R1011 Right upper quadrant pain: Secondary | ICD-10-CM

## 2020-06-10 DIAGNOSIS — F3289 Other specified depressive episodes: Secondary | ICD-10-CM

## 2020-06-10 DIAGNOSIS — G479 Sleep disorder, unspecified: Secondary | ICD-10-CM | POA: Diagnosis not present

## 2020-06-10 DIAGNOSIS — I4819 Other persistent atrial fibrillation: Secondary | ICD-10-CM

## 2020-06-10 DIAGNOSIS — I10 Essential (primary) hypertension: Secondary | ICD-10-CM | POA: Diagnosis not present

## 2020-06-10 DIAGNOSIS — G8929 Other chronic pain: Secondary | ICD-10-CM

## 2020-06-10 DIAGNOSIS — N184 Chronic kidney disease, stage 4 (severe): Secondary | ICD-10-CM

## 2020-06-10 DIAGNOSIS — I69351 Hemiplegia and hemiparesis following cerebral infarction affecting right dominant side: Secondary | ICD-10-CM | POA: Diagnosis not present

## 2020-06-10 DIAGNOSIS — Z23 Encounter for immunization: Secondary | ICD-10-CM

## 2020-06-10 DIAGNOSIS — E7849 Other hyperlipidemia: Secondary | ICD-10-CM | POA: Diagnosis not present

## 2020-06-10 DIAGNOSIS — E039 Hypothyroidism, unspecified: Secondary | ICD-10-CM | POA: Diagnosis not present

## 2020-06-10 DIAGNOSIS — I2510 Atherosclerotic heart disease of native coronary artery without angina pectoris: Secondary | ICD-10-CM

## 2020-06-10 LAB — COMPREHENSIVE METABOLIC PANEL
ALT: 10 U/L (ref 0–35)
AST: 15 U/L (ref 0–37)
Albumin: 4.4 g/dL (ref 3.5–5.2)
Alkaline Phosphatase: 68 U/L (ref 39–117)
BUN: 26 mg/dL — ABNORMAL HIGH (ref 6–23)
CO2: 28 mEq/L (ref 19–32)
Calcium: 9.9 mg/dL (ref 8.4–10.5)
Chloride: 102 mEq/L (ref 96–112)
Creatinine, Ser: 1.68 mg/dL — ABNORMAL HIGH (ref 0.40–1.20)
GFR: 28.5 mL/min — ABNORMAL LOW (ref >60.00)
Glucose, Bld: 105 mg/dL — ABNORMAL HIGH (ref 70–99)
Potassium: 4.7 mEq/L (ref 3.5–5.1)
Sodium: 140 mEq/L (ref 135–145)
Total Bilirubin: 0.4 mg/dL (ref 0.2–1.2)
Total Protein: 7.7 g/dL (ref 6.0–8.3)

## 2020-06-10 LAB — LIPID PANEL
Cholesterol: 136 mg/dL (ref 0–200)
HDL: 53.2 mg/dL (ref >39.00)
LDL Cholesterol: 51 mg/dL (ref 0–99)
NonHDL: 83.13
Total CHOL/HDL Ratio: 3
Triglycerides: 163 mg/dL — ABNORMAL HIGH (ref 0.0–149.0)
VLDL: 32.6 mg/dL (ref 0.0–40.0)

## 2020-06-10 LAB — CBC WITH DIFFERENTIAL/PLATELET
Basophils Absolute: 0 10*3/uL (ref 0.0–0.1)
Basophils Relative: 0.5 % (ref 0.0–3.0)
Eosinophils Absolute: 0.1 10*3/uL (ref 0.0–0.7)
Eosinophils Relative: 1.1 % (ref 0.0–5.0)
HCT: 41.6 % (ref 36.0–46.0)
Hemoglobin: 13.8 g/dL (ref 12.0–15.0)
Lymphocytes Relative: 27.7 % (ref 12.0–46.0)
Lymphs Abs: 2.2 10*3/uL (ref 0.7–4.0)
MCHC: 33.2 g/dL (ref 30.0–36.0)
MCV: 93.2 fl (ref 78.0–100.0)
Monocytes Absolute: 0.5 10*3/uL (ref 0.1–1.0)
Monocytes Relative: 6.1 % (ref 3.0–12.0)
Neutro Abs: 5.1 10*3/uL (ref 1.4–7.7)
Neutrophils Relative %: 64.6 % (ref 43.0–77.0)
Platelets: 326 10*3/uL (ref 150.0–400.0)
RBC: 4.46 Mil/uL (ref 3.87–5.11)
RDW: 13.9 % (ref 11.5–15.5)
WBC: 7.9 10*3/uL (ref 4.0–10.5)

## 2020-06-10 LAB — TSH: TSH: 0.7 u[IU]/mL (ref 0.35–4.50)

## 2020-06-10 LAB — HEMOGLOBIN A1C: Hgb A1c MFr Bld: 5.9 % (ref 4.6–6.5)

## 2020-06-10 NOTE — Assessment & Plan Note (Signed)
Chronic No symptoms concerning for angina Following with cardiology

## 2020-06-10 NOTE — Assessment & Plan Note (Signed)
Chronic On eliquis Continue exercising regularly

## 2020-06-10 NOTE — Assessment & Plan Note (Signed)
Chronic Needs referral back to nephrology since it has been > 3 years cmp

## 2020-06-10 NOTE — Assessment & Plan Note (Signed)
Chronic Check a1c Low sugar / carb diet Stressed regular exercise  

## 2020-06-10 NOTE — Assessment & Plan Note (Signed)
Chronic Related to thoracic radiculopathy Continue nortriptyline 40 mg HS

## 2020-06-10 NOTE — Assessment & Plan Note (Signed)
Chronic  Clinically euthyroid Currently taking levothyroxine 75 mcg daily Check tsh  Titrate med dose if needed  

## 2020-06-10 NOTE — Assessment & Plan Note (Signed)
Chronic Overall controlled , stable - she has occasional depression that lasts 1-2 days Continue prozac 40 mg daily

## 2020-06-10 NOTE — Assessment & Plan Note (Signed)
Chronic She feels her edema is worse - she has not been elevating her legs Mild on exam Continue lasix 20 mg daily Stressed elevating her legs during the day cmp

## 2020-06-10 NOTE — Assessment & Plan Note (Signed)
Chronic Asymptomatic  On eliquis 2.5 mg BID, metoprolol 25 mg BID Cbc, cmp, tsh

## 2020-06-10 NOTE — Assessment & Plan Note (Signed)
Chronic BP well controlled Continue amlodipine 5 mg daily, lasix 20 mg qd, hydralazine 25 mg TID, metoprolol 25 mg BID cmp

## 2020-06-10 NOTE — Assessment & Plan Note (Signed)
Chronic Wakes frequently, but overall  Wakes early Try melatonin Discussed going to bed at the same night each night

## 2020-06-10 NOTE — Assessment & Plan Note (Signed)
Chronic Check lipid panel  Continue atorvastatin 20 mg daily Regular exercise and healthy diet encouraged  

## 2020-06-11 NOTE — Addendum Note (Signed)
Addended by: Marcina Millard on: 06/11/2020 03:39 PM   Modules accepted: Orders

## 2020-06-13 ENCOUNTER — Other Ambulatory Visit: Payer: Self-pay | Admitting: Internal Medicine

## 2020-06-14 ENCOUNTER — Other Ambulatory Visit: Payer: Self-pay | Admitting: Internal Medicine

## 2020-06-17 ENCOUNTER — Other Ambulatory Visit: Payer: Self-pay | Admitting: Internal Medicine

## 2020-06-25 ENCOUNTER — Other Ambulatory Visit: Payer: Self-pay

## 2020-06-25 ENCOUNTER — Ambulatory Visit (INDEPENDENT_AMBULATORY_CARE_PROVIDER_SITE_OTHER): Payer: HMO

## 2020-06-25 VITALS — BP 126/70 | HR 63 | Temp 98.3°F | Ht 64.0 in | Wt 168.6 lb

## 2020-06-25 DIAGNOSIS — Z0000 Encounter for general adult medical examination without abnormal findings: Secondary | ICD-10-CM | POA: Diagnosis not present

## 2020-06-25 NOTE — Progress Notes (Signed)
Subjective:   CARAL WHAN is a 79 y.o. female who presents for Medicare Annual (Subsequent) preventive examination.  Review of Systems    No ROS. Medicare Wellness Visit. Additional risk factors are reflected in social history. Cardiac Risk Factors include: advanced age (>47mn, >>11women);dyslipidemia;family history of premature cardiovascular disease;hypertension     Objective:    Today's Vitals   06/25/20 1515  BP: 126/70  Pulse: 63  Temp: 98.3 F (36.8 C)  SpO2: 98%  Weight: 168 lb 9.6 oz (76.5 kg)  Height: _0  (1.626 m)  PainSc: 0-No pain   Body mass index is 28.94 kg/m.  Advanced Directives 06/25/2020 09/06/2019 06/13/2019 05/03/2019 12/30/2018 03/31/2018 07/12/2017  Does Patient Have a Medical Advance Directive? _1  No No  Does patient want to make changes to medical advance directive? - - - - - Yes (ED - Information included in AVS) -  Would patient like information on creating a medical advance directive? No - Patient declined No - Patient declined No - Patient declined No - Patient declined Yes (MAU/Ambulatory/Procedural Areas - Information given) - No - Patient declined    Current Medications (verified) Outpatient Encounter Medications as of 06/25/2020  Medication Sig  . amLODipine (NORVASC) 5 MG tablet Take 1 tablet (5 mg total) by mouth daily.  .Marland Kitchenatorvastatin (LIPITOR) 20 MG tablet TAKE 1 TABLET(20 MG) BY MOUTH DAILY  . Calcium Carbonate-Vit D-Min (CALCIUM 1200 PO) Take 1 tablet by mouth daily.   . Cholecalciferol (VITAMIN D3) 1000 UNITS CAPS Take 1,000 Units by mouth daily.   . ciclopirox (LOPROX) 0.77 % cream Apply topically 2 (two) times daily.  .Marland KitchenELIQUIS 2.5 MG TABS tablet TAKE 1 TABLET(2.5 MG) BY MOUTH TWICE DAILY  . FLUoxetine (PROZAC) 40 MG capsule TAKE 1 CAPSULE(40 MG) BY MOUTH DAILY  . furosemide (LASIX) 20 MG tablet Take 1 tablet (20 mg total) by mouth daily.  . hydrALAZINE (APRESOLINE) 25 MG tablet TAKE 1 TABLET(25 MG) BY MOUTH  THREE TIMES DAILY  . levothyroxine (SYNTHROID) 75 MCG tablet TAKE 1 TABLET(75 MCG) BY MOUTH DAILY BEFORE BREAKFAST. FOLLOW-UP APPOINTMENT DUE IN NOV  . metoprolol tartrate (LOPRESSOR) 25 MG tablet TAKE 1 TABLET(25 MG) BY MOUTH TWICE DAILY  . nortriptyline (PAMELOR) 10 MG capsule TAKE 4 CAPSULES(40 MG) BY MOUTH AT BEDTIME  . potassium chloride SA (KLOR-CON) 20 MEQ tablet TAKE 1 TABLET(20 MEQ) BY MOUTH 1 TIME DAILY  . vitamin B-12 (CYANOCOBALAMIN) 100 MCG tablet Take 100 mcg by mouth daily.  . benzonatate (TESSALON) 200 MG capsule Take 1 capsule (200 mg total) by mouth 3 (three) times daily as needed for cough. (Patient not taking: Reported on 06/25/2020)   No facility-administered encounter medications on file as of 06/25/2020.    Allergies (verified) Shrimp [shellfish allergy], Valsartan, Tandem plus [fefum-fepo-fa-b cmp-c-zn-mn-cu], Ivp dye [iodinated diagnostic agents], and Penicillins   History: Past Medical History:  Diagnosis Date  . Blood transfusion without reported diagnosis   . Chronic low back pain   . History of echocardiogram    Echo 5/18: EF 55-60, Gr 2 DD, mild MR, mild LAE, PASP 47 // Echo 1/18:  EF 55, mild AI, MAC, mild MR, mild LAE, trivial pericardial effusion  . History of ischemic left MCA stroke 11/2016   left MCA infarct status post mechanical thrombectomy complicated by large left basal ganglia hemorrhage with right shift.  .Marland KitchenHTN (hypertension)   . Hyperlipidemia   . Hypothyroidism   . Osteopenia   . Persistent  atrial fibrillation ([name])    CHADS2-VASc=6 (female, age 59, HTN, CVA) // Apixaban // rate control strategy    Past Surgical History:  Procedure Laterality Date  . ABDOMINAL HYSTERECTOMY  1970  . CHOLECYSTECTOMY  07/2009   Dr. Rise Patience  . COLONOSCOPY  2003  . FLEXIBLE SIGMOIDOSCOPY  2010  . HAND SURGERY    . IR ANGIO INTRA EXTRACRAN SEL COM CAROTID INNOMINATE UNI L MOD SED  11/29/2016  . IR ANGIO VERTEBRAL SEL SUBCLAVIAN INNOMINATE UNI R MOD SED   11/29/2016  . IR PERCUTANEOUS ART THROMBECTOMY/INFUSION INTRACRANIAL INC DIAG ANGIO  11/29/2016  . IR RADIOLOGIST EVAL & MGMT  03/24/2017  . LUMBAR LAMINECTOMY  11/2008   Done by Dr. Patrice Paradise  . RADIOLOGY WITH ANESTHESIA N/A 11/29/2016   Procedure: RADIOLOGY WITH ANESTHESIA;  Surgeon: Radiologist, Medication, MD;  Location: [place];  Service: Radiology;  Laterality: N/A;  . THYROIDECTOMY     Family History  Problem Relation Age of Onset  . Heart disease Father 18       MI age 49s  . Lung cancer Brother 98  . Colon cancer Neg Hx   . Esophageal cancer Neg Hx   . Rectal cancer Neg Hx   . Stomach cancer Neg Hx    Social History   Socioeconomic History  . Marital status: Married    Spouse name: Not on file  . Number of children: 1  . Years of education: Not on file  . Highest education level: Not on file  Occupational History  . Occupation: retired  Tobacco Use  . Smoking status: Never Smoker  . Smokeless tobacco: Never Used  Vaping Use  . Vaping Use: Never used  Substance and Sexual Activity  . Alcohol use: No  . Drug use: No  . Sexual activity: Not Currently  Other Topics Concern  . Not on file  Social History Narrative   Married   Social Determinants of Health   Financial Resource Strain: Low Risk   . Difficulty of Paying Living Expenses: Not hard at all  Food Insecurity: No Food Insecurity  . Worried About Charity fundraiser in the Last Year: Never true  . Ran Out of Food in the Last Year: Never true  Transportation Needs: No Transportation Needs  . Lack of Transportation (Medical): No  . Lack of Transportation (Non-Medical): No  Physical Activity: Sufficiently Active  . Days of Exercise per Week: 5 days  . Minutes of Exercise per Session: 30 min  Stress: No Stress Concern Present  . Feeling of Stress : Not at all  Social Connections: Socially Integrated  . Frequency of Communication with Friends and Family: More than three times a week  . Frequency of Social  Gatherings with Friends and Family: Once a week  . Attends Religious Services: More than 4 times per year  . Active Member of Clubs or Organizations: No  . Attends Archivist Meetings: More than 4 times per year  . Marital Status: Married    Tobacco Counseling Counseling given: Not Answered   Clinical Intake:  Pre-visit preparation completed: Yes  Pain : No/denies pain Pain Score: 0-No pain     BMI - recorded: 28.94 Nutritional Status: BMI 25 -29 Overweight Nutritional Risks: None Diabetes: No  How often do you need to have someone help you when you read instructions, pamphlets, or other written materials from your doctor or pharmacy?: 1 - Never What is the last grade level you completed in school?: HSG  Diabetic? no  Interpreter Needed?: No  Information entered by :: Lisette Abu, LPN   Activities of Daily Living In your present state of health, do you have any difficulty performing the following activities: 06/25/2020 09/06/2019  Hearing? N N  Vision? N N  Difficulty concentrating or making decisions? N N  Walking or climbing stairs? [name]  Comment uses a cane previous stroke  Dressing or bathing? N N  Doing errands, shopping? [name]  Comment - family assists  Preparing Food and eating ? N N  Using the Toilet? N N  In the past six months, have you accidently leaked urine? N -  Do you have problems with loss of bowel control? Y -  Managing your Medications? Y Y  Comment - family assists  Managing your Finances? Y Y  Comment - family assists  Housekeeping or managing your Housekeeping? Y N  Some recent data might be hidden    Patient Care Team: [name], MD as PCP - General (Internal Medicine) Nahser, Wonda Cheng, MD as PCP - Cardiology (Cardiology) Starling Manns, MD as Consulting Physician (Orthopedic Surgery) Nicholaus Bloom, MD as Consulting Physician (Anesthesiology) Gatha Mayer, MD as Consulting Physician (Gastroenterology) Dimitri Ped, RN as [place] any recent [place] you may have received from other than [facility] providers in the past year (date may be approximate).     Assessment:   This is a routine wellness examination for [place].  Hearing/Vision screen No exam data present  Dietary issues and exercise activities discussed: Current Exercise Habits: Home exercise routine, Type of exercise: walking, Time (Minutes): 30, Frequency (Times/Week): 5, Weekly Exercise (Minutes/Week): 150, Intensity: Mild, Exercise limited by: orthopedic condition(s);cardiac condition(s)  Goals    . DIET - INCREASE WATER INTAKE    . Patient Stated     Maintain current health status. Completed physical therapy  Plan to continue to do physical therapy exercises      Depression Screen PHQ 2/9 Scores 06/25/2020 02/22/2020 09/06/2019 06/13/2019 06/13/2019 05/03/2019 12/30/2018  PHQ - 2 Score 0 0 0 1 0 0 0  PHQ- 9 Score - - - 1 - - -  Exception Documentation - - - - - - -  Not completed - - - - - - -    Fall Risk Fall Risk  06/25/2020 06/13/2019 06/13/2019 03/31/2018 03/28/2018  Falls in the past year? _0 No No  Comment - - - - -  Number falls in past yr: _1 - -  Injury with Fall? 0 0 - - -  Risk Factor Category  - - - - -  Risk for fall due to : Impaired balance/gait Impaired balance/gait;Impaired mobility - - -  Follow up Falls evaluation completed;Education provided - - - -    FALL RISK PREVENTION PERTAINING TO THE HOME:  Any stairs in or around the home? No  If so, are there any without handrails? No  Home free of loose throw rugs in walkways, pet beds, electrical cords, etc? Yes  Adequate lighting in your home to reduce risk of falls? Yes   ASSISTIVE DEVICES UTILIZED TO PREVENT FALLS:  Life alert? No  Use of a cane, walker or w/c? Yes  Grab bars in the bathroom? Yes  Shower chair or bench in shower? No  Elevated toilet seat or a handicapped toilet? No    TIMED UP AND GO:  Was the test performed? No .  Length  of time to ambulate 10 feet: 0 sec.   Gait steady and fast with assistive device  Cognitive Function: MMSE - Mini Mental State Exam 06/13/2019 03/31/2018  Not completed: Refused -  Orientation to time - 5  Orientation to Place - 4  Registration - 3  Attention/ Calculation - 3  Recall - 2  Language- name 2 objects - 2  Language- repeat - 1  Language- follow 3 step command - 3  Language- read & follow direction - 1  Write a sentence - 1  Copy design - 1  Total score - 26     6CIT Screen 06/25/2020  What Year? 0 points  What month? 0 points  What time? 0 points  Count back from 20 0 points  Months in reverse 0 points  Repeat phrase 10 points  Total Score 10    Immunizations Immunization History  Administered Date(s) Administered  . Fluad Quad(high Dose 65+) 04/15/2019, 06/10/2020  . Influenza Split 04/22/2011, 05/11/2013  . Influenza, High Dose Seasonal PF 06/04/2015, 04/07/2016, 04/12/2017, 03/31/2018  . Influenza,inj,Quad PF,6+ Mos 04/03/2014  . Moderna SARS-COVID-2 Vaccination 09/19/2019, 10/19/2019  . Pneumococcal Conjugate-13 12/25/2015  . Pneumococcal Polysaccharide-23 11/18/2010  . Td 07/20/2005    TDAP status: Due, Education has been provided regarding the importance of this vaccine. Advised may receive this vaccine at local pharmacy or Health Dept. Aware to provide a copy of the vaccination record if obtained from local pharmacy or Health Dept. Verbalized acceptance and understanding.  Flu Vaccine status: Up to date  Pneumococcal vaccine status: Up to date  Covid-19 vaccine status: Completed vaccines  Qualifies for Shingles Vaccine? Yes   Zostavax completed No   Shingrix Completed?: No.    Education has been provided regarding the importance of this vaccine. Patient has been advised to call insurance company to determine out of pocket expense if they have not yet received this vaccine. Advised may  also receive vaccine at local pharmacy or Health Dept. Verbalized acceptance and understanding.  Screening Tests Health Maintenance  Topic Date Due  . TETANUS/TDAP  06/10/2021 (Originally 07/21/2015)  . INFLUENZA VACCINE  Completed  . DEXA SCAN  Completed  . COVID-19 Vaccine  Completed  . PNA vac Low Risk Adult  Completed    Health Maintenance  There are no preventive care reminders to display for this patient.  Colorectal cancer screening: No longer required.   Mammogram status: No longer required due to age.  Bone Density status: Completed 02/20/2014. Results reflect: Bone density results: NORMAL. Repeat every 5 years.  Lung Cancer Screening: (Low Dose CT Chest recommended if Age 41-80 years, 30 pack-year currently smoking OR have quit w/in 15years.) does not qualify.   Lung Cancer Screening Referral: no  Additional Screening:  Hepatitis C Screening: does not qualify; Completed no  Vision Screening: Recommended annual ophthalmology exams for early detection of glaucoma and other disorders of the eye. Is the patient up to date with their annual eye exam?  No  Who is the provider or what is the name of the office in which the patient attends annual eye exams? Daughter will schedule eye exam. If pt is not established with a provider, would they like to be referred to a provider to establish care? No .   Dental Screening: Recommended annual dental exams for proper oral hygiene  Community Resource Referral / Chronic Care Management: CRR required this visit?  No   CCM required this visit?  No      Plan:  I have personally reviewed and noted the following in the patient's chart:   . Medical and social history . Use of alcohol, tobacco or illicit drugs  . Current medications and supplements . Functional ability and status . Nutritional status . Physical activity . Advanced directives . List of other physicians . Hospitalizations, surgeries, and ER visits in previous  12 months . Vitals . Screenings to include cognitive, depression, and falls . Referrals and appointments  In addition, I have reviewed and discussed with patient certain preventive protocols, quality metrics, and best practice recommendations. A written personalized care plan for preventive services as well as general preventive health recommendations were provided to patient.     Sheral Flow, LPN   [id number]   Nurse Notes: n/a

## 2020-06-25 NOTE — Patient Instructions (Signed)
Valerie Mcclure , Thank you for taking time to come for your Medicare Wellness Visit. I appreciate your ongoing commitment to your health goals. Please review the following plan we discussed and let me know if I can assist you in the future.   Screening recommendations/referrals: Colonoscopy: last done on 04/13/2013; no repeat due to age 79: last done 02/27/2014; no repeat due to age Bone Density: last done 02/20/2014; no repeat due to age Recommended yearly ophthalmology/optometry visit for glaucoma screening and checkup Recommended yearly dental visit for hygiene and checkup  Vaccinations: Influenza vaccine: 06/10/2020 Pneumococcal vaccine: 11/18/2010, 12/25/2015 Tdap vaccine: 07/2005; overdue Shingles vaccine: never done   Covid-19: 09/19/2019, 10/19/2019  Advanced directives: Advance directive discussed with you today. Even though you declined this today please call our office should you change your mind and we can give you the proper paperwork for you to fill out.  Conditions/risks identified: Yes; Reviewed health maintenance screenings with patient today and relevant education, vaccines, and/or referrals were provided. Please continue to do your personal lifestyle choices by: daily care of teeth and gums, regular physical activity (goal should be 5 days a week for 30 minutes), eat a healthy diet, avoid tobacco and drug use, limiting any alcohol intake, taking a low-dose aspirin (if not allergic or have been advised by your provider otherwise) and taking vitamins and minerals as recommended by your provider. Continue doing brain stimulating activities (puzzles, reading, adult coloring books, staying active) to keep memory sharp. Continue to eat heart healthy diet (full of fruits, vegetables, whole grains, lean protein, water--limit salt, fat, and sugar intake) and increase physical activity as tolerated.  Next appointment: Please schedule your next Medicare Wellness Visit with your Nurse Health  Advisor in 1 year by calling [phone].   Preventive Care 17 Years and Older, Female Preventive care refers to lifestyle choices and visits with your health care provider that can promote health and wellness. What does preventive care include?  A yearly physical exam. This is also called an annual well check.  Dental exams once or twice a year.  Routine eye exams. Ask your health care provider how often you should have your eyes checked.  Personal lifestyle choices, including:  Daily care of your teeth and gums.  Regular physical activity.  Eating a healthy diet.  Avoiding tobacco and drug use.  Limiting alcohol use.  Practicing safe sex.  Taking low-dose aspirin every day.  Taking vitamin and mineral supplements as recommended by your health care provider. What happens during an annual well check? The services and screenings done by your health care provider during your annual well check will depend on your age, overall health, lifestyle risk factors, and family history of disease. Counseling  Your health care provider may ask you questions about your:  Alcohol use.  Tobacco use.  Drug use.  Emotional well-being.  Home and relationship well-being.  Sexual activity.  Eating habits.  History of falls.  Memory and ability to understand (cognition).  Work and work Statistician.  Reproductive health. Screening  You may have the following tests or measurements:  Height, weight, and BMI.  Blood pressure.  Lipid and cholesterol levels. These may be checked every 5 years, or more frequently if you are over 75 years old.  Skin check.  Lung cancer screening. You may have this screening every year starting at age 81 if you have a 30-pack-year history of smoking and currently smoke or have quit within the past 15 years.  Fecal occult blood test (  FOBT) of the stool. You may have this test every year starting at age 27.  Flexible sigmoidoscopy or  colonoscopy. You may have a sigmoidoscopy every 5 years or a colonoscopy every 10 years starting at age 27.  Hepatitis C blood test.  Hepatitis B blood test.  Sexually transmitted disease (STD) testing.  Diabetes screening. This is done by checking your blood sugar (glucose) after you have not eaten for a while (fasting). You may have this done every 1-3 years.  Bone density scan. This is done to screen for osteoporosis. You may have this done starting at age 59.  Mammogram. This may be done every 1-2 years. Talk to your health care provider about how often you should have regular mammograms. Talk with your health care provider about your test results, treatment options, and if necessary, the need for more tests. Vaccines  Your health care provider may recommend certain vaccines, such as:  Influenza vaccine. This is recommended every year.  Tetanus, diphtheria, and acellular pertussis (Tdap, Td) vaccine. You may need a Td booster every 10 years.  Zoster vaccine. You may need this after age 10.  Pneumococcal 13-valent conjugate (PCV13) vaccine. One dose is recommended after age 11.  Pneumococcal polysaccharide (PPSV23) vaccine. One dose is recommended after age 75. Talk to your health care provider about which screenings and vaccines you need and how often you need them. This information is not intended to replace advice given to you by your health care provider. Make sure you discuss any questions you have with your health care provider. Document Released: 08/02/2015 Document Revised: 03/25/2016 Document Reviewed: 05/07/2015 Elsevier Interactive Patient Education  2017 [employer] Prevention in the Home Falls can cause injuries. They can happen to people of all ages. There are many things you can do to make your home safe and to help prevent falls. What can I do on the outside of my home?  Regularly fix the edges of walkways and driveways and fix any cracks.  Remove  anything that might make you trip as you walk through a door, such as a raised step or threshold.  Trim any bushes or trees on the path to your home.  Use bright outdoor lighting.  Clear any walking paths of anything that might make someone trip, such as rocks or tools.  Regularly check to see if handrails are loose or broken. Make sure that both sides of any steps have handrails.  Any raised decks and porches should have guardrails on the edges.  Have any leaves, snow, or ice cleared regularly.  Use sand or salt on walking paths during winter.  Clean up any spills in your garage right away. This includes oil or grease spills. What can I do in the bathroom?  Use night lights.  Install grab bars by the toilet and in the tub and shower. Do not use towel bars as grab bars.  Use non-skid mats or decals in the tub or shower.  If you need to sit down in the shower, use a plastic, non-slip stool.  Keep the floor dry. Clean up any water that spills on the floor as soon as it happens.  Remove soap buildup in the tub or shower regularly.  Attach bath mats securely with double-sided non-slip rug tape.  Do not have throw rugs and other things on the floor that can make you trip. What can I do in the bedroom?  Use night lights.  Make sure that you have a light  by your bed that is easy to reach.  Do not use any sheets or blankets that are too big for your bed. They should not hang down onto the floor.  Have a firm chair that has side arms. You can use this for support while you get dressed.  Do not have throw rugs and other things on the floor that can make you trip. What can I do in the kitchen?  Clean up any spills right away.  Avoid walking on wet floors.  Keep items that you use a lot in easy-to-reach places.  If you need to reach something above you, use a strong step stool that has a grab bar.  Keep electrical cords out of the way.  Do not use floor polish or wax that  makes floors slippery. If you must use wax, use non-skid floor wax.  Do not have throw rugs and other things on the floor that can make you trip. What can I do with my stairs?  Do not leave any items on the stairs.  Make sure that there are handrails on both sides of the stairs and use them. Fix handrails that are broken or loose. Make sure that handrails are as long as the stairways.  Check any carpeting to make sure that it is firmly attached to the stairs. Fix any carpet that is loose or worn.  Avoid having throw rugs at the top or bottom of the stairs. If you do have throw rugs, attach them to the floor with carpet tape.  Make sure that you have a light switch at the top of the stairs and the bottom of the stairs. If you do not have them, ask someone to add them for you. What else can I do to help prevent falls?  Wear shoes that:  Do not have high heels.  Have rubber bottoms.  Are comfortable and fit you well.  Are closed at the toe. Do not wear sandals.  If you use a stepladder:  Make sure that it is fully opened. Do not climb a closed stepladder.  Make sure that both sides of the stepladder are locked into place.  Ask someone to hold it for you, if possible.  Clearly mark and make sure that you can see:  Any grab bars or handrails.  First and last steps.  Where the edge of each step is.  Use tools that help you move around (mobility aids) if they are needed. These include:  Canes.  Walkers.  Scooters.  Crutches.  Turn on the lights when you go into a dark area. Replace any light bulbs as soon as they burn out.  Set up your furniture so you have a clear path. Avoid moving your furniture around.  If any of your floors are uneven, fix them.  If there are any pets around you, be aware of where they are.  Review your medicines with your doctor. Some medicines can make you feel dizzy. This can increase your chance of falling. Ask your doctor what other  things that you can do to help prevent falls. This information is not intended to replace advice given to you by your health care provider. Make sure you discuss any questions you have with your health care provider. Document Released: 05/02/2009 Document Revised: 12/12/2015 Document Reviewed: 08/10/2014 Elsevier Interactive Patient Education  2017 [employer].

## 2020-07-10 ENCOUNTER — Other Ambulatory Visit: Payer: Self-pay

## 2020-07-10 NOTE — Patient Outreach (Signed)
  [facility]) Care Management Chronic Special Needs Program    07/10/2020  Name: Valerie Mcclure, DOB: 1941/01/26  MRN: [id number]   Ms. Lakiesha Ralphs is enrolled in a chronic special needs plan for Diabetes.  [employer] Care Management Team has assumed care and services for this member. Case closed by [place] RN, [facility], [place] Management [phone]

## 2020-07-12 ENCOUNTER — Other Ambulatory Visit: Payer: Self-pay | Admitting: Internal Medicine

## 2020-07-22 ENCOUNTER — Other Ambulatory Visit: Payer: Self-pay | Admitting: Internal Medicine

## 2020-07-25 ENCOUNTER — Other Ambulatory Visit: Payer: Self-pay | Admitting: Internal Medicine

## 2020-08-15 DIAGNOSIS — E785 Hyperlipidemia, unspecified: Secondary | ICD-10-CM | POA: Diagnosis not present

## 2020-08-15 DIAGNOSIS — I129 Hypertensive chronic kidney disease with stage 1 through stage 4 chronic kidney disease, or unspecified chronic kidney disease: Secondary | ICD-10-CM | POA: Diagnosis not present

## 2020-08-15 DIAGNOSIS — N2581 Secondary hyperparathyroidism of renal origin: Secondary | ICD-10-CM | POA: Diagnosis not present

## 2020-08-15 DIAGNOSIS — I4891 Unspecified atrial fibrillation: Secondary | ICD-10-CM | POA: Diagnosis not present

## 2020-08-15 DIAGNOSIS — N184 Chronic kidney disease, stage 4 (severe): Secondary | ICD-10-CM | POA: Diagnosis not present

## 2020-08-15 DIAGNOSIS — Z8673 Personal history of transient ischemic attack (TIA), and cerebral infarction without residual deficits: Secondary | ICD-10-CM | POA: Diagnosis not present

## 2020-08-19 DIAGNOSIS — N184 Chronic kidney disease, stage 4 (severe): Secondary | ICD-10-CM | POA: Diagnosis not present

## 2020-09-02 ENCOUNTER — Other Ambulatory Visit: Payer: Self-pay | Admitting: Internal Medicine

## 2020-09-11 ENCOUNTER — Ambulatory Visit: Payer: HMO | Admitting: Internal Medicine

## 2020-09-11 ENCOUNTER — Telehealth: Payer: Self-pay | Admitting: Internal Medicine

## 2020-09-11 NOTE — Progress Notes (Signed)
  Chronic Care Management   Outreach Note  09/11/2020 Name: Valerie Mcclure MRN: [id number] DOB: Jan 30, 1941  Referred by: [name], MD Reason for referral : No chief complaint on file.   An unsuccessful telephone outreach was attempted today. The patient was referred to the pharmacist for assistance with care management and care coordination.   Follow Up Plan:   Carley Perdue [employer] Scheduler

## 2020-09-12 ENCOUNTER — Other Ambulatory Visit: Payer: Self-pay | Admitting: Internal Medicine

## 2020-09-12 ENCOUNTER — Encounter: Payer: Self-pay | Admitting: Internal Medicine

## 2020-09-12 ENCOUNTER — Ambulatory Visit: Payer: HMO | Admitting: Internal Medicine

## 2020-09-12 ENCOUNTER — Ambulatory Visit (INDEPENDENT_AMBULATORY_CARE_PROVIDER_SITE_OTHER): Payer: HMO | Admitting: Internal Medicine

## 2020-09-12 ENCOUNTER — Other Ambulatory Visit: Payer: Self-pay

## 2020-09-12 VITALS — BP 160/78 | HR 71 | Ht 64.0 in | Wt 171.0 lb

## 2020-09-12 DIAGNOSIS — I10 Essential (primary) hypertension: Secondary | ICD-10-CM | POA: Diagnosis not present

## 2020-09-12 DIAGNOSIS — I5033 Acute on chronic diastolic (congestive) heart failure: Secondary | ICD-10-CM

## 2020-09-12 DIAGNOSIS — N184 Chronic kidney disease, stage 4 (severe): Secondary | ICD-10-CM | POA: Diagnosis not present

## 2020-09-12 MED ORDER — TORSEMIDE 20 MG PO TABS: 20.0000 mg | ORAL_TABLET | Freq: Every day | ORAL | 5 refills | Status: DC

## 2020-09-12 NOTE — Progress Notes (Signed)
Patient ID: Valerie Mcclure, female   DOB: [birth year], 80 y.o.   MRN: [id number]        Chief Complaint: follow up CKD, HT, and worsening bilateral leg swelling       HPI:  Valerie Mcclure is a 80 y.o. female here with daughter who notes pt has seen renal recently with stopping lasix 20 qd, and added ACE.  Unfortunately, pt has gained several lbs and marked leg swelling bilateral .  Pt denies chest pain, increased sob or doe, wheezing, orthopnea, PND, palpitations, dizziness or syncope.  Denies new focal neuro s/s.   Pt denies polydipsia, polyuria, Despite the obvious swelling today off the lasix, pt is adamant she did not have a diuretic effect from taking the lasix and does not want to restart it.  BP has also been elevated at home, and has been more like sbp 145 at home in the past 3-6 months.  No other new complaints.  Has scales at home to check daily wts.    Wt Readings from Last 3 Encounters:  09/12/20 171 lb (77.6 kg)  06/25/20 168 lb 9.6 oz (76.5 kg)  06/10/20 168 lb (76.2 kg)   BP Readings from Last 3 Encounters:  09/12/20 (!) 160/78  06/25/20 126/70  06/10/20 130/68         Past Medical History:  Diagnosis Date  . Blood transfusion without reported diagnosis   . Chronic low back pain   . History of echocardiogram    Echo 5/18: EF 55-60, Gr 2 DD, mild MR, mild LAE, PASP 47 // Echo 1/18:  EF 55, mild AI, MAC, mild MR, mild LAE, trivial pericardial effusion  . History of ischemic left MCA stroke 11/2016   left MCA infarct status post mechanical thrombectomy complicated by large left basal ganglia hemorrhage with right shift.  Marland Kitchen HTN (hypertension)   . Hyperlipidemia   . Hypothyroidism   . Osteopenia   . Persistent atrial fibrillation ([name])    CHADS2-VASc=6 (female, age 11, HTN, CVA) // Apixaban // rate control strategy    Past Surgical History:  Procedure Laterality Date  . ABDOMINAL HYSTERECTOMY  1970  . CHOLECYSTECTOMY  07/2009   Dr. Rise Patience  . COLONOSCOPY  2003   . FLEXIBLE SIGMOIDOSCOPY  2010  . HAND SURGERY    . IR ANGIO INTRA EXTRACRAN SEL COM CAROTID INNOMINATE UNI L MOD SED  11/29/2016  . IR ANGIO VERTEBRAL SEL SUBCLAVIAN INNOMINATE UNI R MOD SED  11/29/2016  . IR PERCUTANEOUS ART THROMBECTOMY/INFUSION INTRACRANIAL INC DIAG ANGIO  11/29/2016  . IR RADIOLOGIST EVAL & MGMT  03/24/2017  . LUMBAR LAMINECTOMY  11/2008   Done by Dr. Patrice Paradise  . RADIOLOGY WITH ANESTHESIA N/A 11/29/2016   Procedure: RADIOLOGY WITH ANESTHESIA;  Surgeon: Radiologist, Medication, MD;  Location: [place];  Service: Radiology;  Laterality: N/A;  . THYROIDECTOMY      reports that she has never smoked. She has never used smokeless tobacco. She reports that she does not drink alcohol and does not use drugs. family history includes Heart disease (age of onset: 26) in her father; Lung cancer (age of onset: 41) in her brother. Allergies  Allergen Reactions  . Shrimp [Shellfish Allergy] Anaphylaxis and Rash    Break outs and swelling of the throat  . Valsartan Other (See Comments)    Renal failure  . Tandem Plus [Fefum-Fepo-Fa-B Cmp-C-Zn-Mn-Cu] Nausea And Vomiting  . Ivp Dye [Iodinated Diagnostic Agents] Rash    itching  . Penicillins Itching and  Rash    Has patient had a PCN reaction causing immediate rash, facial/tongue/throat swelling, SOB or lightheadedness with hypotension: Yes Has patient had a PCN reaction causing severe rash involving mucus membranes or skin necrosis: No Has patient had a PCN reaction that required hospitalization: No Has patient had a PCN reaction occurring within the last 10 years: No If all of the above answers are "NO", then may proceed with Cephalosporin use.    Current Outpatient Medications on File Prior to Visit  Medication Sig Dispense Refill  . amLODipine (NORVASC) 5 MG tablet Take 1 tablet (5 mg total) by mouth daily. 90 tablet 3  . atorvastatin (LIPITOR) 20 MG tablet TAKE 1 TABLET(20 MG) BY MOUTH DAILY 90 tablet 3  . benzonatate (TESSALON) 200 MG  capsule Take 1 capsule (200 mg total) by mouth 3 (three) times daily as needed for cough. 30 capsule 0  . Calcium Carbonate-Vit D-Min (CALCIUM 1200 PO) Take 1 tablet by mouth daily.    . Cholecalciferol (VITAMIN D3) 1000 UNITS CAPS Take 1,000 Units by mouth daily.    . ciclopirox (LOPROX) 0.77 % cream Apply topically 2 (two) times daily. 90 g 2  . ELIQUIS 2.5 MG TABS tablet TAKE 1 TABLET(2.5 MG) BY MOUTH TWICE DAILY 60 tablet 1  . FLUoxetine (PROZAC) 40 MG capsule TAKE 1 CAPSULE(40 MG) BY MOUTH DAILY 90 capsule 1  . hydrALAZINE (APRESOLINE) 25 MG tablet TAKE 1 TABLET(25 MG) BY MOUTH THREE TIMES DAILY 270 tablet 1  . metoprolol tartrate (LOPRESSOR) 25 MG tablet TAKE 1 TABLET(25 MG) BY MOUTH TWICE DAILY 180 tablet 1  . nortriptyline (PAMELOR) 10 MG capsule TAKE 4 CAPSULES(40 MG) BY MOUTH AT BEDTIME 360 capsule 0  . vitamin B-12 (CYANOCOBALAMIN) 100 MCG tablet Take 100 mcg by mouth daily.    . furosemide (LASIX) 20 MG tablet Take 1 tablet (20 mg total) by mouth daily. (Patient not taking: Reported on 09/12/2020) 90 tablet 1  . lisinopril (ZESTRIL) 2.5 MG tablet Take 2.5 mg by mouth daily.    . potassium chloride SA (KLOR-CON) 20 MEQ tablet TAKE 1 TABLET(20 MEQ) BY MOUTH 1 TIME DAILY (Patient not taking: Reported on 09/12/2020) 90 tablet 1   No current facility-administered medications on file prior to visit.        ROS:  All others reviewed and negative.  Objective        PE:  BP (!) 160/78   Pulse 71   Ht _0  (1.626 m)   Wt 171 lb (77.6 kg)   SpO2 99%   BMI 29.35 kg/m                 Constitutional: Pt appears in NAD               HENT: Head: NCAT.                Right Ear: External ear normal.                 Left Ear: External ear normal.                Eyes: . Pupils are equal, round, and reactive to light. Conjunctivae and EOM are normal               Nose: without d/c or deformity               Neck: Neck supple. Gross normal ROM  Cardiovascular: Normal rate and  regular rhythm.                 Pulmonary/Chest: Effort normal and breath sounds without rales or wheezing.                Abd:  Soft, NT, ND, + BS, no organomegaly               Neurological: Pt is alert. At baseline orientation, motor grossly intact               Skin: Skin is warm. No rashes, no other new lesions, LE edema - 2+ to knees               Psychiatric: Pt behavior is normal without agitation   Micro: none  Cardiac tracings I have personally interpreted today:  none  Pertinent Radiological findings (summarize): none   Lab Results  Component Value Date   WBC 7.9 06/10/2020   HGB 13.8 06/10/2020   HCT 41.6 06/10/2020   PLT 326.0 06/10/2020   GLUCOSE 105 (H) 06/10/2020   CHOL 136 06/10/2020   TRIG 163.0 (H) 06/10/2020   HDL 53.20 06/10/2020   LDLDIRECT 82.0 06/08/2018   LDLCALC 51 06/10/2020   ALT 10 06/10/2020   AST 15 06/10/2020   NA 140 06/10/2020   K 4.7 06/10/2020   CL 102 06/10/2020   CREATININE 1.68 (H) 06/10/2020   BUN 26 (H) 06/10/2020   CO2 28 06/10/2020   TSH 0.70 06/10/2020   INR 1.35 07/12/2017   HGBA1C 5.9 06/10/2020   Assessment/Plan:  Valerie Mcclure is a 80 y.o. White or Caucasian [1] female with  has a past medical history of Blood transfusion without reported diagnosis, Chronic low back pain, History of echocardiogram, History of ischemic left MCA stroke (11/2016), HTN (hypertension), Hyperlipidemia, Hypothyroidism, Osteopenia, and Persistent atrial fibrillation ([place]).  Acute on chronic diastolic (congestive) heart failure ([name]) D/w pt and daughter, for add back diuretic with torsemide 20 qd, check daily wts, and f/u PCP in 1 wk for BMP and exam  Stage 4 chronic kidney disease ([place]) Lab Results  Component Value Date   CREATININE 1.68 (H) 06/10/2020   Stable overall, cont to avoid nephrotoxins, f/u 1 wk on new diuretic  Hypertension, uncontrolled Also likely to improve with diuresis, to cont all other med tx, f/u as above - norvasc,  lopressor, hydralazine, lisinopril   Current Outpatient Medications (Endocrine & Metabolic):  .  levothyroxine (SYNTHROID) 75 MCG tablet, TAKE 1 TABLET(75 MCG) BY MOUTH DAILY BEFORE BREAKFAST. FOLLOW-UP APPOINTMENT DUE IN NOV  Current Outpatient Medications (Cardiovascular):  .  amLODipine (NORVASC) 5 MG tablet, Take 1 tablet (5 mg total) by mouth daily. Marland Kitchen  atorvastatin (LIPITOR) 20 MG tablet, TAKE 1 TABLET(20 MG) BY MOUTH DAILY .  hydrALAZINE (APRESOLINE) 25 MG tablet, TAKE 1 TABLET(25 MG) BY MOUTH THREE TIMES DAILY .  metoprolol tartrate (LOPRESSOR) 25 MG tablet, TAKE 1 TABLET(25 MG) BY MOUTH TWICE DAILY .  torsemide (DEMADEX) 20 MG tablet, Take 1 tablet (20 mg total) by mouth daily. .  furosemide (LASIX) 20 MG tablet, Take 1 tablet (20 mg total) by mouth daily. (Patient not taking: Reported on 09/12/2020) .  lisinopril (ZESTRIL) 2.5 MG tablet, Take 2.5 mg by mouth daily.  Current Outpatient Medications (Respiratory):  .  benzonatate (TESSALON) 200 MG capsule, Take 1 capsule (200 mg total) by mouth 3 (three) times daily as needed for cough.   Current Outpatient Medications (Hematological):  Marland Kitchen  ELIQUIS 2.5 MG TABS tablet, TAKE 1 TABLET(2.5 MG) BY MOUTH TWICE DAILY .  vitamin B-12 (CYANOCOBALAMIN) 100 MCG tablet, Take 100 mcg by mouth daily.  Current Outpatient Medications (Other):  Marland Kitchen  Calcium Carbonate-Vit D-Min (CALCIUM 1200 PO), Take 1 tablet by mouth daily. .  Cholecalciferol (VITAMIN D3) 1000 UNITS CAPS, Take 1,000 Units by mouth daily. .  ciclopirox (LOPROX) 0.77 % cream, Apply topically 2 (two) times daily. Marland Kitchen  FLUoxetine (PROZAC) 40 MG capsule, TAKE 1 CAPSULE(40 MG) BY MOUTH DAILY .  nortriptyline (PAMELOR) 10 MG capsule, TAKE 4 CAPSULES(40 MG) BY MOUTH AT BEDTIME .  potassium chloride SA (KLOR-CON) 20 MEQ tablet, TAKE 1 TABLET(20 MEQ) BY MOUTH 1 TIME DAILY (Patient not taking: Reported on 09/12/2020)   Followup: Return in about 1 week (around 09/19/2020), or to Dr Quay Burow.  Cathlean Cower, MD 09/15/2020 5:02 AM [facility]

## 2020-09-12 NOTE — Patient Instructions (Signed)
Please take all new medication as prescribed - the torsemide starting tomorrow AM  Please weigh every day, write it down, and bring to next visit  Please continue all other medications as before, and refills have been done if requested.  Please have the pharmacy call with any other refills you may need.  Please keep your appointments with your specialists as you may have planned  Please return to Dr Quay Burow in 1 week with your daily weights, to recheck your weight and exam and blood testing

## 2020-09-13 ENCOUNTER — Telehealth: Payer: Self-pay | Admitting: Internal Medicine

## 2020-09-13 NOTE — Telephone Encounter (Signed)
yes

## 2020-09-13 NOTE — Telephone Encounter (Signed)
Patients daughter calling to ask if its okay to take the lisinopril (ZESTRIL) 2.5 MG tablet with the torsemide (DEMADEX) 20 MG tablet

## 2020-09-15 ENCOUNTER — Encounter: Payer: Self-pay | Admitting: Internal Medicine

## 2020-09-15 DIAGNOSIS — I5033 Acute on chronic diastolic (congestive) heart failure: Secondary | ICD-10-CM | POA: Insufficient documentation

## 2020-09-15 NOTE — Assessment & Plan Note (Signed)
Also likely to improve with diuresis, to cont all other med tx, f/u as above - norvasc, lopressor, hydralazine, lisinopril   Current Outpatient Medications (Endocrine & Metabolic):  .  levothyroxine (SYNTHROID) 75 MCG tablet, TAKE 1 TABLET(75 MCG) BY MOUTH DAILY BEFORE BREAKFAST. FOLLOW-UP APPOINTMENT DUE IN NOV  Current Outpatient Medications (Cardiovascular):  .  amLODipine (NORVASC) 5 MG tablet, Take 1 tablet (5 mg total) by mouth daily. Marland Kitchen  atorvastatin (LIPITOR) 20 MG tablet, TAKE 1 TABLET(20 MG) BY MOUTH DAILY .  hydrALAZINE (APRESOLINE) 25 MG tablet, TAKE 1 TABLET(25 MG) BY MOUTH THREE TIMES DAILY .  metoprolol tartrate (LOPRESSOR) 25 MG tablet, TAKE 1 TABLET(25 MG) BY MOUTH TWICE DAILY .  torsemide (DEMADEX) 20 MG tablet, Take 1 tablet (20 mg total) by mouth daily. .  furosemide (LASIX) 20 MG tablet, Take 1 tablet (20 mg total) by mouth daily. (Patient not taking: Reported on 09/12/2020) .  lisinopril (ZESTRIL) 2.5 MG tablet, Take 2.5 mg by mouth daily.  Current Outpatient Medications (Respiratory):  .  benzonatate (TESSALON) 200 MG capsule, Take 1 capsule (200 mg total) by mouth 3 (three) times daily as needed for cough.   Current Outpatient Medications (Hematological):  Marland Kitchen  ELIQUIS 2.5 MG TABS tablet, TAKE 1 TABLET(2.5 MG) BY MOUTH TWICE DAILY .  vitamin B-12 (CYANOCOBALAMIN) 100 MCG tablet, Take 100 mcg by mouth daily.  Current Outpatient Medications (Other):  Marland Kitchen  Calcium Carbonate-Vit D-Min (CALCIUM 1200 PO), Take 1 tablet by mouth daily. .  Cholecalciferol (VITAMIN D3) 1000 UNITS CAPS, Take 1,000 Units by mouth daily. .  ciclopirox (LOPROX) 0.77 % cream, Apply topically 2 (two) times daily. Marland Kitchen  FLUoxetine (PROZAC) 40 MG capsule, TAKE 1 CAPSULE(40 MG) BY MOUTH DAILY .  nortriptyline (PAMELOR) 10 MG capsule, TAKE 4 CAPSULES(40 MG) BY MOUTH AT BEDTIME .  potassium chloride SA (KLOR-CON) 20 MEQ tablet, TAKE 1 TABLET(20 MEQ) BY MOUTH 1 TIME DAILY (Patient not taking: Reported on  09/12/2020)

## 2020-09-15 NOTE — Assessment & Plan Note (Signed)
D/w pt and daughter, for add back diuretic with torsemide 20 qd, check daily wts, and f/u PCP in 1 wk for BMP and exam

## 2020-09-15 NOTE — Assessment & Plan Note (Signed)
Lab Results  Component Value Date   CREATININE 1.68 (H) 06/10/2020   Stable overall, cont to avoid nephrotoxins, f/u 1 wk on new diuretic

## 2020-09-16 NOTE — Telephone Encounter (Signed)
Left message for patient's daughter today.

## 2020-09-17 ENCOUNTER — Other Ambulatory Visit: Payer: Self-pay | Admitting: Internal Medicine

## 2020-09-19 ENCOUNTER — Telehealth: Payer: Self-pay | Admitting: Internal Medicine

## 2020-09-19 MED ORDER — POLYMYXIN B-TRIMETHOPRIM 10000-0.1 UNIT/ML-% OP SOLN: 1.0000 [drp] | OPHTHALMIC | 0 refills | Status: DC

## 2020-09-19 NOTE — Telephone Encounter (Signed)
Patients daughter called and was wondering if something could be called in for the patient for pink eye. She said that her left eye is swollen and red. She can be reached at [phone]. Please advise

## 2020-09-19 NOTE — Telephone Encounter (Signed)
Message left for patient

## 2020-09-19 NOTE — Telephone Encounter (Signed)
Yes, sent to [facility]

## 2020-10-02 ENCOUNTER — Telehealth: Payer: Self-pay | Admitting: Internal Medicine

## 2020-10-02 NOTE — Progress Notes (Signed)
  Chronic Care Management   Note  10/02/2020 Name: Valerie Mcclure MRN: [id number] DOB: 03-May-1941  Valerie Mcclure is a 80 y.o. year old female who is a primary care patient of Burns, Claudina Lick, MD. I reached out to [name] by phone today in response to a referral sent by Valerie Mcclure's PCP, [name], MD.   [name] was given information about Chronic Care Management services today including:  1. CCM service includes personalized support from designated clinical staff supervised by her physician, including individualized plan of care and coordination with other care providers 2. 24/7 contact phone numbers for assistance for urgent and routine care needs. 3. Service will only be billed when office clinical staff spend 20 minutes or more in a month to coordinate care. 4. Only one practitioner may furnish and bill the service in a calendar month. 5. The patient may stop CCM services at any time (effective at the end of the month) by phone call to the office staff.   Patient agreed to services and verbal consent obtained.   Follow up plan:   Carley Perdue [employer] Scheduler

## 2020-10-09 ENCOUNTER — Other Ambulatory Visit: Payer: Self-pay | Admitting: Internal Medicine

## 2020-10-10 ENCOUNTER — Other Ambulatory Visit: Payer: Self-pay | Admitting: Internal Medicine

## 2020-10-28 ENCOUNTER — Other Ambulatory Visit: Payer: Self-pay | Admitting: Internal Medicine

## 2020-11-12 ENCOUNTER — Telehealth: Payer: Self-pay

## 2020-11-12 NOTE — Progress Notes (Signed)
Chronic Care Management Pharmacy Assistant   Name: LATRICA CLOWERS  MRN: [id number] DOB: 1941/06/07  Reason for Encounter: Initial Questions  Appointment: OV 11/12/20 @ 2 pm  Recent office visits:  06/10/20 Quay Burow - PCP - F/U Persistent atrial fibrillation. Flu vaccine given. No med changes. Referral for kidney doctor ordered. F/u in 6 months.  09/12/20 [facility] - Internal Medicine - follow up CKD, HT, and worsening bilateral leg swelling. Keep weight log an f/u in 1 week. Start Torsemide 20 mg daily. .  Recent consult visits:  None noted  Hospital visits:  None in previous 6 months  Medications: Outpatient Encounter Medications as of 11/12/2020  Medication Sig  . amLODipine (NORVASC) 5 MG tablet TAKE 1 TABLET(5 MG) BY MOUTH DAILY  . atorvastatin (LIPITOR) 20 MG tablet TAKE 1 TABLET(20 MG) BY MOUTH DAILY  . benzonatate (TESSALON) 200 MG capsule Take 1 capsule (200 mg total) by mouth 3 (three) times daily as needed for cough.  . Calcium Carbonate-Vit D-Min (CALCIUM 1200 PO) Take 1 tablet by mouth daily.  . Cholecalciferol (VITAMIN D3) 1000 UNITS CAPS Take 1,000 Units by mouth daily.  . ciclopirox (LOPROX) 0.77 % cream Apply topically 2 (two) times daily.  Marland Kitchen ELIQUIS 2.5 MG TABS tablet TAKE 1 TABLET(2.5 MG) BY MOUTH TWICE DAILY  . FLUoxetine (PROZAC) 40 MG capsule TAKE 1 CAPSULE(40 MG) BY MOUTH DAILY  . furosemide (LASIX) 20 MG tablet TAKE 1 TABLET(20 MG) BY MOUTH DAILY  . hydrALAZINE (APRESOLINE) 25 MG tablet TAKE 1 TABLET(25 MG) BY MOUTH THREE TIMES DAILY  . levothyroxine (SYNTHROID) 75 MCG tablet TAKE 1 TABLET(75 MCG) BY MOUTH DAILY BEFORE BREAKFAST. FOLLOW-UP APPOINTMENT DUE IN NOV  . lisinopril (ZESTRIL) 2.5 MG tablet Take 2.5 mg by mouth daily.  . metoprolol tartrate (LOPRESSOR) 25 MG tablet TAKE 1 TABLET(25 MG) BY MOUTH TWICE DAILY  . nortriptyline (PAMELOR) 10 MG capsule TAKE 4 CAPSULES(40 MG) BY MOUTH AT BEDTIME  . potassium chloride SA (KLOR-CON) 20 MEQ tablet TAKE 1  TABLET(20 MEQ) BY MOUTH 1 TIME DAILY (Patient not taking: Reported on 09/12/2020)  . torsemide (DEMADEX) 20 MG tablet Take 1 tablet (20 mg total) by mouth daily.  Marland Kitchen trimethoprim-polymyxin b (POLYTRIM) ophthalmic solution Place 1 drop into the left eye every 4 (four) hours. Use for 7 days  . vitamin B-12 (CYANOCOBALAMIN) 100 MCG tablet Take 100 mcg by mouth daily.   No facility-administered encounter medications on file as of 11/12/2020.    Have you seen any other providers since your last visit?  Patient daughter states she hasn't seen any other providers.  Any changes in your medications or health?  Patient daughter states no changes at this time.  Any side effects from any medications?  Patient daughter states no side effects.  Do you have an symptoms or problems not managed by your medications? Patient daughter states no problems at this time.  Any concerns about your health right now?  Patient daughter states no concerns right now.  Has your provider asked that you check blood pressure, blood sugar, or follow special diet at home?  Patient daughter states she doesn't check her BP or glucose nor does she follow a special diet.  Do you get any type of exercise on a regular basis?  Patient daughter states she does walk some outside, still goes to church and to [employer] and [employer].  Can you think of a goal you would like to reach for your health?  Patient daughter states no goals at this  time.  Do you have any problems getting your medications?  Patient daughter states no problem getting medication.  Is there anything that you would like to discuss during the appointment?  Patient daughter states not at this time.  Please bring medications and supplements to appointment   Star Rating Drugs: Atorvastatin - last fill 09/13/20 90D Lisinopril - last fill 08/19/20 [place], RMA Clinical Pharmacists Assistant [phone]  Time Spent: 14

## 2020-11-13 ENCOUNTER — Other Ambulatory Visit (INDEPENDENT_AMBULATORY_CARE_PROVIDER_SITE_OTHER): Payer: HMO

## 2020-11-13 ENCOUNTER — Other Ambulatory Visit: Payer: Self-pay | Admitting: Internal Medicine

## 2020-11-13 ENCOUNTER — Other Ambulatory Visit: Payer: HMO

## 2020-11-13 ENCOUNTER — Ambulatory Visit (INDEPENDENT_AMBULATORY_CARE_PROVIDER_SITE_OTHER): Payer: HMO | Admitting: Pharmacist

## 2020-11-13 ENCOUNTER — Other Ambulatory Visit: Payer: Self-pay

## 2020-11-13 DIAGNOSIS — I4819 Other persistent atrial fibrillation: Secondary | ICD-10-CM

## 2020-11-13 DIAGNOSIS — E7849 Other hyperlipidemia: Secondary | ICD-10-CM

## 2020-11-13 DIAGNOSIS — N184 Chronic kidney disease, stage 4 (severe): Secondary | ICD-10-CM | POA: Diagnosis not present

## 2020-11-13 DIAGNOSIS — E039 Hypothyroidism, unspecified: Secondary | ICD-10-CM | POA: Diagnosis not present

## 2020-11-13 DIAGNOSIS — I5032 Chronic diastolic (congestive) heart failure: Secondary | ICD-10-CM

## 2020-11-13 DIAGNOSIS — I10 Essential (primary) hypertension: Secondary | ICD-10-CM

## 2020-11-13 DIAGNOSIS — F3289 Other specified depressive episodes: Secondary | ICD-10-CM | POA: Diagnosis not present

## 2020-11-13 LAB — BASIC METABOLIC PANEL
BUN: 35 mg/dL — ABNORMAL HIGH (ref 6–23)
CO2: 29 mEq/L (ref 19–32)
Calcium: 9.8 mg/dL (ref 8.4–10.5)
Chloride: 99 mEq/L (ref 96–112)
Creatinine, Ser: 2.01 mg/dL — ABNORMAL HIGH (ref 0.40–1.20)
GFR: 22.91 mL/min — ABNORMAL LOW (ref >60.00)
Glucose, Bld: 106 mg/dL — ABNORMAL HIGH (ref 70–99)
Potassium: 3.8 mEq/L (ref 3.5–5.1)
Sodium: 140 mEq/L (ref 135–145)

## 2020-11-13 NOTE — Patient Instructions (Addendum)
Thank you for meeting with me to discuss your medications! I look forward to working with you to achieve your health care goals. Below is a summary of what we talked about during the visit:   -STOP taking furosemide - put it in the back of your cabinet for later use if needed.   -CONTINUE torsemide 20 mg daily.  -WEIGH YOURSELF every day. If you gain 3+ lbs overnight or 5+ lbs in 1 week, take an extra 1/2 dose of torsemide.  -If you have to take more than 2 extra 1/2 doses of torsemide in a week, contact Dr Quay Burow - we may need to do lab work.  Start taking a calcium-vitamin D pill twice a day for bone health.  You may try Voltaren Gel or lidocaine patch for back pain / joint pain.  You may take up to 3000 mg of Tylenol per day - try Tylenol Extra Strength 2 tablets up to 3 times daily.   Goals Addressed            This Visit's Progress   . Manage My Medicine       Timeframe:  Long-Range Goal Priority:  High Start Date:    11/13/20                         Expected End Date:  02/12/21                     Follow Up Date 01/16/21   - call for medicine refill 2 or 3 days before it runs out - call if I am sick and can't take my medicine - keep a list of all the medicines I take; vitamins and herbals too  -Stop Furosemide. Hold off on restarting potassium until lab results  -Weigh daily; if you gain more than 3 pounds in one day or 5 pounds in one week, take extra 1/2 dose of torsemide. Call PCP if you take more than 2 extra doses in a week. -keep f/u appts with PCP and nephrology   Why is this important?   . These steps will help you keep on track with your medicines.   Notes:       Patient Care Plan: CCM Pharmacy Care Plan    Problem Identified: Hypertension, Hyperlipidemia, Atrial Fibrillation, Heart Failure, Chronic Kidney Disease, Hypothyroidism, Depression and Osteopenia   Priority: High    Long-Range Goal: Disease management   Start Date: 11/13/2020  Expected End Date:  05/15/2021  This Visit's Progress: On track  Priority: High  Note:   Current Barriers:  . Unable to independently monitor therapeutic efficacy . Suboptimal therapeutic regimen for CKD  Pharmacist Clinical Goal(s):  Marland Kitchen Patient will achieve adherence to monitoring guidelines and medication adherence to achieve therapeutic efficacy . adhere to plan to optimize therapeutic regimen for CKD as evidenced by report of adherence to recommended medication management changes through collaboration with PharmD and provider.   Interventions: . 1:1 collaboration with [name], MD regarding development and update of comprehensive plan of care as evidenced by provider attestation and co-signature . Inter-disciplinary care team collaboration (see longitudinal plan of care) . Comprehensive medication review performed; medication list updated in electronic medical record  Hyperlipidemia / hx TIA: (LDL goal < 70) -Controlled - LDL is at goal; pt denies side effects -Current treatment: . Atorvastatin 20 mg daily -Educated on Cholesterol goals;  Benefits of statin for ASCVD risk reduction; -Recommended to continue current  medication  Atrial Fibrillation (Goal: prevent stroke and major bleeding) -Controlled - pt denies recent palpitations; she endorses compliance with medications and denies bleeding; she has submitted applications for Eliquis patient assistance and is awaiting determination -CHADSVASC: 6 -Current treatment: . Rate control: Metoprolol tartrate 25 mg BID . Anticoagulation: Eliquis 2.5 mg BID -Home BP and HR readings: 130/70s, HR 60s -Counseled on increased risk of stroke due to Afib and benefits of anticoagulation for stroke prevention; importance of adherence to anticoagulant exactly as prescribed; bleeding risk associated with Eliquis and importance of self-monitoring for signs/symptoms of bleeding; -Recommended to continue current medication  Heart Failure / HTN / CKD (Goal: BP <  130/80, prevent exacerbations) -Not ideally controlled - pt is currently taking both furosemide and torsemide and reports swelling is generally controlled; she is NOT taking potassium since furosemide and Kcl were both dc'd by nephrologist in January 2022. Pt did not f/u with PCP as instructed for repeat BMP 1 week after starting torsemide in Feb 2022; she is not currently weighing herself due to scale being out of batteries;  -Last ejection fraction: 55-60% (Date: 11/2016) -HF type: Diastolic -Current treatment: . Amlodipine 5 mg daily . Furosemide 20 mg daily - dc'd by nephro in Jan, pt is taking . Torsemide 20 mg daily  - started Feb 2022 d/t swelling . Hydralazine 25 mg TID . Lisinopril 2.5 mg daily . Metoprolol tartrate 25 mg BID . Klor-Con 20 mEq daily - not taking -Educated on Benefits of medications for managing symptoms and prolonging life Importance of weighing daily; if you gain more than 3 pounds in one day or 5 pounds in one week, take extra 1/2 dose of torsemide Proper diuretic administration and potassium supplementation  -Plan:  BMP today  Stop furosemide  Hold off on restarting KCl pending BMP May adjust potassium/torsemide based on BMP results  Depression/Anxiety (Goal: manage symptoms) -Controlled - pt reports mood is well controlled; she is sleeping well with nortriptyline at bedtime -Current treatment: . Fluoxetine 40 mg daily . Nortriptyline 10 mg - 4 caps HS -PHQ9: 0 (06/2020) -GAD7: not on file -Connected with PCP for mental health support -Educated on Benefits of medication for symptom control -Recommended to continue current medication  Osteopenia (Goal: prevent fractures) -Not ideally controlled - pt is not taking ca/vitamin D -Last DEXA Scan: 2015  T-Score femoral neck: +0.5  T-Score forearm radius: +1.2 -Patient is not a candidate for pharmacologic treatment -Recommend [id number] units of vitamin D daily. Recommend 1200 mg of calcium daily from dietary  and supplemental sources.  Hypothyroidism (Goal: maintain TSH in goal range) -Controlled - TSH is within goal range; pt takes levothyroxine with other meds in the morning -Current treatment  . Levothyroxine 75 mcg daily -Recommended to continue current medication  Health Maintenance -Vaccine gaps: Shingrix -Current therapy:  Marland Kitchen Vitamin B12 100 mcg daily . Tylenol 500 mg QID -Patient is satisfied with current therapy and denies issues -Recommended Tylenol up to 3000 mg/day, lidocaine patch or Voltaren gel as needed  -Recommended Shingrix vaccine at pharmacy  Patient Goals/Self-Care Activities . Patient will:  - take medications as prescribed focus on medication adherence by routine check blood pressure daily, document, and provide at future appointments  -Stop Furosemide -weighing daily; if you gain more than 3 pounds in one day or 5 pounds in one week, take extra 1/2 dose of torsemide. Call PCP if you take more than 2 extra doses in a week. -keep f/u appts with PCP and nephrology  Follow  Up Plan: Telephone follow up appointment with care management team member scheduled for: 2 months      Valerie Mcclure was given information about Chronic Care Management services today including:  1. CCM service includes personalized support from designated clinical staff supervised by her physician, including individualized plan of care and coordination with other care providers 2. 24/7 contact phone numbers for assistance for urgent and routine care needs. 3. Standard insurance, coinsurance, copays and deductibles apply for chronic care management only during months in which we provide at least 20 minutes of these services. Most insurances cover these services at 100%, however patients may be responsible for any copay, coinsurance and/or deductible if applicable. This service may help you avoid the need for more expensive face-to-face services. 4. Only one practitioner may furnish and bill the service  in a calendar month. 5. The patient may stop CCM services at any time (effective at the end of the month) by phone call to the office staff.  Patient agreed to services and verbal consent obtained.   Patient verbalizes understanding of instructions provided today and agrees to view in [place].  Telephone follow up appointment with pharmacy team member scheduled for: 2 months  Charlene Brooke, PharmD, [place], CPP Clinical Pharmacist [facility] Disease Chronic kidney disease (CKD) occurs when the kidneys are permanently damaged over a long period of time. When your kidneys are not working well, they cannot remove waste, fluids, and other substances from your blood as well as they did before. The substances can build up, which can worsen kidney damage and affect how your body functions. Certain foods lead to a buildup of these substances. By changing your diet, you can help prevent more kidney damage and delay or prevent the need for dialysis. What are tips for following this plan? Reading food labels  Check the amount of salt (sodium) in foods. Choose foods that have less than 300 milligrams (mg) per serving.  Check the ingredient list for phosphorus or potassium-based additives or preservatives.  Check the amount of saturated fat and trans fat. Limit or avoid these fats as told by your dietitian. Shopping  Avoid buying foods that are: ? Processed or prepackaged. ? Calcium-enriched or that have calcium added to them (are fortified).  Do not buy foods that have salt or sodium listed among the first five ingredients.  Buy canned vegetables and beans that say "no salt added" or "low sodium" and rinse them before eating. Cooking  Soak vegetables, such as potatoes, before cooking to reduce potassium. To do this: 1. Peel and cut the vegetables into small pieces. 2. Soak the vegetables in warm water for at least 2  hours. For every 1 cup of vegetables, use 10 cups of water. 3. Drain and rinse the vegetables with warm water. 4. Boil the vegetables for at least 5 minutes. Meal planning  Limit the amount of protein you eat from plant and animal sources each day.  Do not add salt to food when cooking or before eating.  Eat meals and snacks at around the same time each day. General information  Talk with your health care provider about whether you should take a vitamin and mineral supplement.  Use standard measuring cups and spoons to measure servings of foods. Use a kitchen scale to measure portions of protein foods.  If told by your health care provider, avoid drinking too much fluid. Measure and count all liquids,  including water, ice, soups, flavored gelatin, and frozen desserts such as ice pops or ice cream. If you have diabetes:  If you have diabetes (diabetes mellitus) and CKD, it is important to keep your blood sugar (glucose) in the target range recommended by your health care provider. Follow your diabetes management plan. This may include: ? Checking your blood glucose regularly. ? Taking medicines by mouth, taking insulin, or taking both. ? Exercising for at least 30 minutes on 5 or more days each week, or as told by your health care provider. ? Tracking how many servings of carbohydrates you eat at each meal.  You may be given specific guidelines on how much of certain foods and nutrients you may eat, depending on your stage of kidney disease and whether you have high blood pressure (hypertension). Follow your meal plan as told by your dietitian. What nutrients should I limit? Work with your health care provider and dietitian to develop a meal plan that is right for you. Foods you can eat and foods you should limit or avoid will depend on the stage of your kidney disease and any other health conditions you have. The items listed below are not a complete list. Talk with your dietitian about  what dietary choices are best for you. Potassium Potassium affects how steadily your heart beats. If too much potassium builds up in your blood, the potassium can cause an irregular heartbeat or even a heart attack. You may need to limit or avoid foods that are high in potassium, such as:  Milk and soy milk.  Fruits, such as bananas, apricots, nectarines, melon, prunes, raisins, kiwi, and oranges.  Vegetables, such as potatoes, sweet potatoes, yams, tomatoes, leafy greens, beets, avocado, pumpkin, and winter squash.  White and lima beans.  Whole-wheat breads and pastas.  Beans and nuts. Phosphorus Phosphorus is a mineral found in your bones. A balance between calcium and phosphorus is needed to build and maintain healthy bones. Too much phosphorus pulls calcium from your bones. This can make your bones weak and more likely to break. Too much phosphorus can also make your skin itch. You may need to limit or avoid foods that are high in phosphorus, such as:  Milk and dairy products.  Dried beans and peas.  Tofu, soy milk, and other soy-based meat replacements.  Dark-colored sodas.  Nuts and peanut butter.  Meat, poultry, and fish.  Bran cereals and oatmeal. Protein Protein helps you make and keep muscle. It also helps to repair your body's cells and tissues. One of the natural breakdown products of protein is a waste product called urea. When your kidneys are not working properly, they cannot remove wastes, such as urea. Reducing how much protein you eat can help prevent a buildup of urea in your blood. Depending on your stage of kidney disease, you may need to limit foods that are high in protein. Sources of animal protein include:  Meat (all types).  Fish and seafood.  Poultry.  Eggs.  Dairy. Other protein foods include:  Beans and legumes.  Nuts and nut butter.  Soy and tofu.   Sodium Sodium helps to maintain a healthy balance of fluids in your body. Too much  sodium can increase your blood pressure and have a negative effect on your heart and lungs. Too much sodium can also cause your body to retain too much fluid, making your kidneys work harder. Most people should have less than 2,300 mg of sodium each day. If you have hypertension,  you may need to limit your sodium to 1,500 mg each day. You may need to limit or avoid foods that are high in sodium, such as:  Salt seasonings.  Soy sauce.  Cured and processed meats.  Salted crackers and snack foods.  Fast food.  Canned soups and most canned foods.  Pickled foods.  Vegetable juice.  Boxed mixes or ready-to-eat boxed meals and side dishes.  Bottled dressings, sauces, and marinades. Talk with your dietitian about how much potassium, phosphorus, protein, and sodium you may have each day. Summary  Chronic kidney disease (CKD) can lead to a buildup of waste and extra substances in the body. Certain foods lead to a buildup of these substances. By changing your diet as told, you can help prevent more kidney damage and delay or prevent the need for dialysis.  Food intake changes are different for each person with CKD. Work with a dietitian to set up nutrient goals and a meal plan that is right for you.  If you have diabetes and CKD, it is important to keep your blood sugar in the target range recommended by your health care provider. This information is not intended to replace advice given to you by your health care provider. Make sure you discuss any questions you have with your health care provider. Document Revised: 10/30/2019 Document Reviewed: 10/30/2019 Elsevier Patient Education  2021 [employer].

## 2020-11-13 NOTE — Progress Notes (Signed)
Chronic Care Management Pharmacy Note  11/13/2020 Name:  Valerie Mcclure MRN:  [id number] DOB:  1941/02/22  Subjective: Valerie Mcclure is an 80 y.o. year old female who is a primary patient of Burns, Claudina Lick, MD.  The CCM team was consulted for assistance with disease management and care coordination needs.    Engaged with patient face to face for initial visit in response to provider referral for pharmacy case management and/or care coordination services.   Consent to Services:  The patient was given the following information about Chronic Care Management services today, agreed to services, and gave verbal consent: 1. CCM service includes personalized support from designated clinical staff supervised by the primary care provider, including individualized plan of care and coordination with other care providers 2. 24/7 contact phone numbers for assistance for urgent and routine care needs. 3. Service will only be billed when office clinical staff spend 20 minutes or more in a month to coordinate care. 4. Only one practitioner may furnish and bill the service in a calendar month. 5.The patient may stop CCM services at any time (effective at the end of the month) by phone call to the office staff. 6. The patient will be responsible for cost sharing (co-pay) of up to 20% of the service fee (after annual deductible is met). Patient agreed to services and consent obtained.  Patient Care Team: [name], MD as PCP - General (Internal Medicine) Nahser, Wonda Cheng, MD as PCP - Cardiology (Cardiology) Starling Manns, MD as Consulting Physician (Orthopedic Surgery) Nicholaus Bloom, MD as Consulting Physician (Anesthesiology) Gatha Mayer, MD as Consulting Physician (Gastroenterology) Charlton Haws, [facility] as Pharmacist (Pharmacist)  Recent office visits: 06/10/20 Quay Burow - PCP - F/U Persistent atrial fibrillation. Flu vaccine given. No med changes. Referral for kidney doctor ordered. F/u  in 6 months.  09/12/20 Jenny Reichmann - Internal Medicine - follow [place], HTN, and worsening bilateral leg swelling. Nephro recently stopped furosemide and started lisinopril. Advised to Keep weight log an f/u in 1 week. Start Torsemide 20 mg daily.  Recent consult visits: 08/15/20 nephrology: started lisinopril 2.5 mg daily  05/14/20 Dr Acie Fredrickson (cardiology): f/u Afib, prior stroke. In NSR. No med changes.  Hospital visits: None in previous 6 months  Objective:  Lab Results  Component Value Date   CREATININE 1.68 (H) 06/10/2020   BUN 26 (H) 06/10/2020   GFR 28.50 (L) 06/10/2020   GFRNONAA 36 (L) 07/13/2017   GFRAA 42 (L) 07/13/2017   NA 140 06/10/2020   K 4.7 06/10/2020   CALCIUM 9.9 06/10/2020   CO2 28 06/10/2020   GLUCOSE 105 (H) 06/10/2020    Lab Results  Component Value Date/Time   HGBA1C 5.9 06/10/2020 01:31 PM   HGBA1C 6.1 12/11/2019 02:49 PM   GFR 28.50 (L) 06/10/2020 01:31 PM   GFR 27.58 (L) 12/11/2019 02:49 PM    Last diabetic Eye exam: No results found for: HMDIABEYEEXA  Last diabetic Foot exam: No results found for: HMDIABFOOTEX   Lab Results  Component Value Date   CHOL 136 06/10/2020   HDL 53.20 06/10/2020   LDLCALC 51 06/10/2020   LDLDIRECT 82.0 06/08/2018   TRIG 163.0 (H) 06/10/2020   CHOLHDL 3 06/10/2020    Hepatic Function Latest Ref Rng & Units 06/10/2020 12/11/2019 06/13/2019  Total Protein 6.0 - 8.3 g/dL 7.7 7.3 7.6  Albumin 3.5 - 5.2 g/dL 4.4 4.4 4.1  AST 0 - 37 U/L _0 ALT 0 - 35 U/L  _0 Alk Phosphatase 39 - 117 U/L 68 59 81  Total Bilirubin 0.2 - 1.2 mg/dL 0.4 0.4 0.4  Bilirubin, Direct 0.0 - 0.3 mg/dL - - -    Lab Results  Component Value Date/Time   TSH 0.70 06/10/2020 01:31 PM   TSH 2.33 12/11/2019 02:49 PM   FREET4 1.36 (H) 11/30/2016 01:46 PM    CBC Latest Ref Rng & Units 06/10/2020 12/11/2019 06/13/2019  WBC 4.0 - 10.5 K/uL 7.9 8.7 9.3  Hemoglobin 12.0 - 15.0 g/dL 13.8 12.6 13.1  Hematocrit 36.0 - 46.0 % 41.6 37.1 38.6   Platelets 150.0 - 400.0 K/uL 326.0 329.0 362.0    Lab Results  Component Value Date/Time   VD25OH 29 (L) 12/13/2008 09:58 PM    Clinical ASCVD: Yes  The ASCVD Risk score Mikey Bussing DC Jr., et al., 2013) failed to calculate for the following reasons:   The 2013 ASCVD risk score is only valid for ages 77 to 61    Depression screen PHQ 2/9 06/25/2020 02/22/2020 09/06/2019  Decreased Interest 0 0 0  Down, Depressed, Hopeless 0 0 0  PHQ - 2 Score 0 0 0  Altered sleeping - - -  Tired, decreased energy - - -  Change in appetite - - -  Feeling bad or failure about yourself  - - -  Trouble concentrating - - -  Moving slowly or fidgety/restless - - -  Suicidal thoughts - - -  PHQ-9 Score - - -  Difficult doing work/chores - - -  Some recent data might be hidden     CHA2DS2-VASc Score = 6  The patient's score is based upon: CHF History: No HTN History: Yes Diabetes History: No Stroke History: Yes Vascular Disease History: No Age Score: 2 Gender Score: 1      Social History   Tobacco Use  Smoking Status Never Smoker  Smokeless Tobacco Never Used   BP Readings from Last 3 Encounters:  09/12/20 (!) 160/78  06/25/20 126/70  06/10/20 130/68   Pulse Readings from Last 3 Encounters:  09/12/20 71  06/25/20 63  06/10/20 62   Wt Readings from Last 3 Encounters:  09/12/20 171 lb (77.6 kg)  06/25/20 168 lb 9.6 oz (76.5 kg)  06/10/20 168 lb (76.2 kg)   BMI Readings from Last 3 Encounters:  09/12/20 29.35 kg/m  06/25/20 28.94 kg/m  06/10/20 28.84 kg/m    Assessment/Interventions: Review of patient past medical history, allergies, medications, health status, including review of consultants reports, laboratory and other test data, was performed as part of comprehensive evaluation and provision of chronic care management services.   SDOH:  (Social Determinants of Health) assessments and interventions performed: Yes  SDOH Screenings   Alcohol Screen: Low Risk   . Last Alcohol  Screening Score (AUDIT): 0  Depression (PHQ2-9): Low Risk   . PHQ-2 Score: 0  Financial Resource Strain: Low Risk   . Difficulty of Paying Living Expenses: Not hard at all  Food Insecurity: No Food Insecurity  . Worried About Charity fundraiser in the Last Year: Never true  . Ran Out of Food in the Last Year: Never true  Housing: Low Risk   . Last Housing Risk Score: 0  Physical Activity: Sufficiently Active  . Days of Exercise per Week: 5 days  . Minutes of Exercise per Session: 30 min  Social Connections: Socially Integrated  . Frequency of Communication with Friends and Family: More than three times a week  .  Frequency of Social Gatherings with Friends and Family: Once a week  . Attends Religious Services: More than 4 times per year  . Active Member of Clubs or Organizations: No  . Attends Archivist Meetings: More than 4 times per year  . Marital Status: Married  Stress: No Stress Concern Present  . Feeling of Stress : Not at all  Tobacco Use: Low Risk   . Smoking Tobacco Use: Never Smoker  . Smokeless Tobacco Use: Never Used  Transportation Needs: No Transportation Needs  . Lack of Transportation (Medical): No  . Lack of Transportation (Non-Medical): No    CCM Care Plan  Allergies  Allergen Reactions  . Shrimp [Shellfish Allergy] Anaphylaxis and Rash    Break outs and swelling of the throat  . Valsartan Other (See Comments)    Renal failure  . Tandem Plus [Fefum-Fepo-Fa-B Cmp-C-Zn-Mn-Cu] Nausea And Vomiting  . Ivp Dye [Iodinated Diagnostic Agents] Rash    itching  . Penicillins Itching and Rash    Has patient had a PCN reaction causing immediate rash, facial/tongue/throat swelling, SOB or lightheadedness with hypotension: Yes Has patient had a PCN reaction causing severe rash involving mucus membranes or skin necrosis: No Has patient had a PCN reaction that required hospitalization: No Has patient had a PCN reaction occurring within the last 10 years:  No If all of the above answers are "NO", then may proceed with Cephalosporin use.     Medications Reviewed Today    Reviewed by Charlton Haws, [facility] (Pharmacist) on 11/13/20 at [place] List Status: <None>  Medication Order Taking? Sig Documenting Provider Last Dose Status Informant  acetaminophen (TYLENOL) 500 MG tablet [id number] Yes Take 1,000 mg by mouth every 8 (eight) hours as needed. [provider] Taking Active   amLODipine (NORVASC) 5 MG tablet [id number] Yes TAKE 1 TABLET(5 MG) BY MOUTH DAILY Burns, Claudina Lick, MD Taking Active   atorvastatin (LIPITOR) 20 MG tablet [id number] Yes TAKE 1 TABLET(20 MG) BY MOUTH DAILY Burns, Claudina Lick, MD Taking Active   Calcium Carbonate-Vit D-Min (CALCIUM 1200 PO) [id number] Yes Take 1 tablet by mouth daily. [provider] Taking Active Self  Cholecalciferol (VITAMIN D3) 1000 UNITS CAPS [id number] Yes Take 1,000 Units by mouth daily. [provider] Taking Active Self  ciclopirox (LOPROX) 0.77 % cream [id number] Yes Apply topically 2 (two) times daily. [name], MD Taking Active   ELIQUIS 2.5 MG TABS tablet [id number] Yes TAKE 1 TABLET(2.5 MG) BY MOUTH TWICE DAILY Burns, Claudina Lick, MD Taking Active   FLUoxetine (PROZAC) 40 MG capsule [id number] Yes Take 40 mg by mouth daily. [provider] Taking Active   hydrALAZINE (APRESOLINE) 25 MG tablet [id number] Yes TAKE 1 TABLET(25 MG) BY MOUTH THREE TIMES DAILY Burns, Claudina Lick, MD Taking Active   levothyroxine (SYNTHROID) 75 MCG tablet [id number] Yes TAKE 1 TABLET(75 MCG) BY MOUTH DAILY BEFORE BREAKFAST. FOLLOW-UP APPOINTMENT DUE IN NOV Burns, Claudina Lick, MD Taking Active   lisinopril (ZESTRIL) 2.5 MG tablet [id number] Yes Take 2.5 mg by mouth daily. [provider] Taking Active   metoprolol tartrate (LOPRESSOR) 25 MG tablet [id number] Yes TAKE 1 TABLET(25 MG) BY MOUTH TWICE DAILY Burns, Claudina Lick, MD Taking Active   nortriptyline (PAMELOR) 10 MG capsule [id number] Yes TAKE 4  CAPSULES(40 MG) BY MOUTH AT BEDTIME Burns, Claudina Lick, MD Taking Active   potassium chloride SA (KLOR-CON) 20 MEQ tablet [id number] No TAKE 1 TABLET(20 MEQ) BY MOUTH 1 TIME DAILY  Patient not  taking: Reported on 11/13/2020   [name], MD Not Taking Active            Med Note Malena Catholic Nov 13, 2020  2:59 PM) Pt has not been taking potassium since nephrologist dc'd furosemide in January 2022  torsemide (DEMADEX) 20 MG tablet [id number] Yes Take 1 tablet (20 mg total) by mouth daily. Biagio Borg, MD Taking Active   vitamin B-12 (CYANOCOBALAMIN) 100 MCG tablet [id number] Yes Take 100 mcg by mouth daily. [provider] Taking Active Self          Patient Active Problem List   Diagnosis Date Noted  . Acute on chronic diastolic (congestive) heart failure ([place]) 09/15/2020  . Eye tearing, left 02/19/2020  . TMJ syndrome, left 02/19/2020  . Lump in neck 06/08/2018  . Sleep difficulties 03/31/2018  . Persistent atrial fibrillation ([place]) 09/06/2017  . TIA (transient ischemic attack) 07/12/2017  . Bradycardia 04/12/2017  . Depression 01/29/2017  . Fall   . Aphasia as late effect of cerebrovascular accident (CVA)   . Chronic diastolic heart failure ([place])   . Dysarthria, post-stroke   . Atrial flutter ([place]) 12/22/2016  . Spastic hemiplegia of right dominant side as late effect of cerebral infarction ([place])   . Anemia of chronic disease   . Hypertension, uncontrolled   . Dysphagia, post-stroke   . Anterior cerebral circulation hemorrhagic infarction ([place]) 12/03/2016  . Right hemiparesis ([place])   . Nontraumatic subcortical hemorrhage of left cerebral hemisphere ([place])   . Mild aortic regurgitation 08/20/2016  . Bilateral edema of lower extremity 04/07/2016  . Pancreatic duct dilated 01/09/2016  . Stage 4 chronic kidney disease ([place]) 12/25/2015  . Mild tricuspid regurgitation 12/25/2015  . Mild mitral regurgitation 12/25/2015  . Prediabetes 06/10/2015  . Abdominal  pain, chronic, right upper quadrant 06/04/2015  . Chronic low back pain   . Pain in thoracic spine 04/18/2010  . Coronary atherosclerosis 01/24/2009  . ARTHRITIS, LEFT KNEE 12/13/2008  . Hypothyroidism 11/21/2007  . Hyperlipidemia 11/09/2007  . Osteopenia 11/09/2007    Immunization History  Administered Date(s) Administered  . Fluad Quad(high Dose 65+) 04/15/2019, 06/10/2020  . Influenza Split 04/22/2011, 05/11/2013  . Influenza, High Dose Seasonal PF 06/04/2015, 04/07/2016, 04/12/2017, 03/31/2018  . Influenza,inj,Quad PF,6+ Mos 04/03/2014  . Moderna Sars-Covid-2 Vaccination 09/19/2019, 10/19/2019, 07/02/2020  . Pneumococcal Conjugate-13 12/25/2015  . Pneumococcal Polysaccharide-23 11/18/2010  . Td 07/20/2005    Conditions to be addressed/monitored:  Hypertension, Hyperlipidemia, Atrial Fibrillation, Heart Failure, Chronic Kidney Disease, Hypothyroidism, Depression and Osteopenia  Care Plan : CCM Pharmacy Care Plan  Updates made by Charlton Haws, [place] since 11/13/2020 12:00 AM    Problem: Hypertension, Hyperlipidemia, Atrial Fibrillation, Heart Failure, Chronic Kidney Disease, Hypothyroidism, Depression and Osteopenia   Priority: High    Long-Range Goal: Disease management   Start Date: 11/13/2020  Expected End Date: 05/15/2021  This Visit's Progress: On track  Priority: High  Note:   Current Barriers:  . Unable to independently monitor therapeutic efficacy . Suboptimal therapeutic regimen for CKD  Pharmacist Clinical Goal(s):  Marland Kitchen Patient will achieve adherence to monitoring guidelines and medication adherence to achieve therapeutic efficacy . adhere to plan to optimize therapeutic regimen for CKD as evidenced by report of adherence to recommended medication management changes through collaboration with PharmD and provider.   Interventions: . 1:1 collaboration with [name], MD regarding development and update of comprehensive plan of care as evidenced by  provider attestation and co-signature .  Inter-disciplinary care team collaboration (see longitudinal plan of care) . Comprehensive medication review performed; medication list updated in electronic medical record  Hyperlipidemia / hx TIA: (LDL goal < 70) -Controlled - LDL is at goal; pt denies side effects -Current treatment: . Atorvastatin 20 mg daily -Educated on Cholesterol goals;  Benefits of statin for ASCVD risk reduction; -Recommended to continue current medication  Atrial Fibrillation (Goal: prevent stroke and major bleeding) -Controlled - pt denies recent palpitations; she endorses compliance with medications and denies bleeding; she has submitted applications for Eliquis patient assistance and is awaiting determination -CHADSVASC: 6 -Current treatment: . Rate control: Metoprolol tartrate 25 mg BID . Anticoagulation: Eliquis 2.5 mg BID -Home BP and HR readings: 130/70s, HR 60s -Counseled on increased risk of stroke due to Afib and benefits of anticoagulation for stroke prevention; importance of adherence to anticoagulant exactly as prescribed; bleeding risk associated with Eliquis and importance of self-monitoring for signs/symptoms of bleeding; -Recommended to continue current medication  Heart Failure / HTN / CKD (Goal: BP < 130/80, prevent exacerbations) -Not ideally controlled - pt is currently taking both furosemide and torsemide and reports swelling is generally controlled; she is NOT taking potassium since furosemide and Kcl were both dc'd by nephrologist in January 2022. Pt did not f/u with PCP as instructed for repeat BMP 1 week after starting torsemide in Feb 2022; she is not currently weighing herself due to scale being out of batteries;  -Last ejection fraction: 55-60% (Date: 11/2016) -HF type: Diastolic -Current treatment: . Amlodipine 5 mg daily . Furosemide 20 mg daily - dc'd by nephro in Jan, pt is taking . Torsemide 20 mg daily  - started Feb 2022 d/t  swelling . Hydralazine 25 mg TID . Lisinopril 2.5 mg daily . Metoprolol tartrate 25 mg BID . Klor-Con 20 mEq daily - not taking -Educated on Benefits of medications for managing symptoms and prolonging life Importance of weighing daily; if you gain more than 3 pounds in one day or 5 pounds in one week, take extra 1/2 dose of torsemide Proper diuretic administration and potassium supplementation  -Plan:  BMP today  Stop furosemide  Hold off on restarting KCl pending BMP May adjust potassium/torsemide based on BMP results  Depression/Anxiety (Goal: manage symptoms) -Controlled - pt reports mood is well controlled; she is sleeping well with nortriptyline at bedtime -Current treatment: . Fluoxetine 40 mg daily . Nortriptyline 10 mg - 4 caps HS -PHQ9: 0 (06/2020) -GAD7: not on file -Connected with PCP for mental health support -Educated on Benefits of medication for symptom control -Recommended to continue current medication  Osteopenia (Goal: prevent fractures) -Not ideally controlled - pt is not taking ca/vitamin D -Last DEXA Scan: 2015  T-Score femoral neck: +0.5  T-Score forearm radius: +1.2 -Patient is not a candidate for pharmacologic treatment -Recommend [phone] units of vitamin D daily. Recommend 1200 mg of calcium daily from dietary and supplemental sources.  Hypothyroidism (Goal: maintain TSH in goal range) -Controlled - TSH is within goal range; pt takes levothyroxine with other meds in the morning -Current treatment  . Levothyroxine 75 mcg daily -Recommended to continue current medication  Health Maintenance -Vaccine gaps: Shingrix -Current therapy:  Marland Kitchen Vitamin B12 100 mcg daily . Tylenol 500 mg QID -Patient is satisfied with current therapy and denies issues -Recommended Tylenol up to 3000 mg/day, lidocaine patch or Voltaren gel as needed  -Recommended Shingrix vaccine at pharmacy  Patient Goals/Self-Care Activities . Patient will:  - take medications as  prescribed focus on  medication adherence by routine check blood pressure daily, document, and provide at future appointments  -Stop Furosemide -weighing daily; if you gain more than 3 pounds in one day or 5 pounds in one week, take extra 1/2 dose of torsemide. Call PCP if you take more than 2 extra doses in a week. -keep f/u appts with PCP and nephrology  Follow Up Plan: Telephone follow up appointment with care management team member scheduled for: 2 months      Medication Assistance: Application for Eliquis ([employer])  medication assistance program. in process.  Anticipated assistance start date 11/21/20.  See plan of care for additional detail.  Patient's preferred pharmacy is:  [id number] - [address], [address] [place] [address] Phone: [phone] Fax: [phone]  Uses pill box? No - prefes bottles Pt endorses 100% compliance  We discussed: Current pharmacy is preferred with insurance plan and patient is satisfied with pharmacy services Patient decided to: Continue current medication management strategy  Care Plan and Follow Up Patient Decision:  Patient agrees to Care Plan and Follow-up.  Plan: Telephone follow up appointment with care management team member scheduled for:  2 months  Charlene Brooke, PharmD, [place], CPP Clinical Pharmacist [facility] [phone]

## 2020-11-14 ENCOUNTER — Other Ambulatory Visit: Payer: Self-pay | Admitting: Internal Medicine

## 2020-12-09 ENCOUNTER — Other Ambulatory Visit: Payer: Self-pay | Admitting: Internal Medicine

## 2020-12-09 ENCOUNTER — Telehealth: Payer: Self-pay | Admitting: Internal Medicine

## 2020-12-09 ENCOUNTER — Ambulatory Visit: Payer: HMO | Admitting: Internal Medicine

## 2020-12-09 DIAGNOSIS — Z8673 Personal history of transient ischemic attack (TIA), and cerebral infarction without residual deficits: Secondary | ICD-10-CM | POA: Diagnosis not present

## 2020-12-09 DIAGNOSIS — I4891 Unspecified atrial fibrillation: Secondary | ICD-10-CM | POA: Diagnosis not present

## 2020-12-09 DIAGNOSIS — N184 Chronic kidney disease, stage 4 (severe): Secondary | ICD-10-CM | POA: Diagnosis not present

## 2020-12-09 DIAGNOSIS — N2581 Secondary hyperparathyroidism of renal origin: Secondary | ICD-10-CM | POA: Diagnosis not present

## 2020-12-09 DIAGNOSIS — E785 Hyperlipidemia, unspecified: Secondary | ICD-10-CM | POA: Diagnosis not present

## 2020-12-09 DIAGNOSIS — R7303 Prediabetes: Secondary | ICD-10-CM | POA: Diagnosis not present

## 2020-12-09 DIAGNOSIS — I129 Hypertensive chronic kidney disease with stage 1 through stage 4 chronic kidney disease, or unspecified chronic kidney disease: Secondary | ICD-10-CM | POA: Diagnosis not present

## 2020-12-09 NOTE — Telephone Encounter (Signed)
Please fax most recent labs to [facility]  Fax number: [phone]

## 2020-12-09 NOTE — Telephone Encounter (Signed)
Faxed today

## 2020-12-11 NOTE — Progress Notes (Signed)
Subjective:    Patient ID: Valerie Mcclure, female    DOB: [birth year], 80 y.o.   MRN: [id number]  HPI The patient is here for follow up of their chronic medical problems, including CAD, Afib, htn, hyperlipidemia, hypothyroidism, CKD, h/o CVA w/ dysphasia, R sided weakness, depression, prediabetes, chronic RUQ pain from thoracic neuropathy, insomnia, LE edema  She is taking all of her medications as prescribed.    She is exercising regularly.     Amlodipine was d/c'd by  Dr Allyson Sabal.    Medications and allergies reviewed with patient and updated if appropriate.  Patient Active Problem List   Diagnosis Date Noted  . Acute on chronic diastolic (congestive) heart failure ([place]) 09/15/2020  . Eye tearing, left 02/19/2020  . TMJ syndrome, left 02/19/2020  . Lump in neck 06/08/2018  . Sleep difficulties 03/31/2018  . Persistent atrial fibrillation ([place]) 09/06/2017  . TIA (transient ischemic attack) 07/12/2017  . Bradycardia 04/12/2017  . Depression 01/29/2017  . Fall   . Aphasia as late effect of cerebrovascular accident (CVA)   . Chronic diastolic heart failure ([place])   . Dysarthria, post-stroke   . Atrial flutter ([place]) 12/22/2016  . Spastic hemiplegia of right dominant side as late effect of cerebral infarction ([place])   . Anemia of chronic disease   . Hypertension, uncontrolled   . Dysphagia, post-stroke   . Anterior cerebral circulation hemorrhagic infarction ([place]) 12/03/2016  . Right hemiparesis ([place])   . Nontraumatic subcortical hemorrhage of left cerebral hemisphere ([place])   . Mild aortic regurgitation 08/20/2016  . Bilateral edema of lower extremity 04/07/2016  . Pancreatic duct dilated 01/09/2016  . Stage 4 chronic kidney disease ([place]) 12/25/2015  . Mild tricuspid regurgitation 12/25/2015  . Mild mitral regurgitation 12/25/2015  . Prediabetes 06/10/2015  . Abdominal pain, chronic, right upper quadrant 06/04/2015  . Chronic low back pain   . Pain in thoracic spine  04/18/2010  . Coronary atherosclerosis 01/24/2009  . ARTHRITIS, LEFT KNEE 12/13/2008  . Hypothyroidism 11/21/2007  . Hyperlipidemia 11/09/2007  . Osteopenia 11/09/2007    Current Outpatient Medications on File Prior to Visit  Medication Sig Dispense Refill  . acetaminophen (TYLENOL) 500 MG tablet Take 1,000 mg by mouth every 8 (eight) hours as needed.    Marland Kitchen atorvastatin (LIPITOR) 20 MG tablet TAKE 1 TABLET(20 MG) BY MOUTH DAILY 90 tablet 3  . Calcium Carbonate-Vit D-Min (CALCIUM 1200 PO) Take 1 tablet by mouth daily.    . Cholecalciferol (VITAMIN D3) 1000 UNITS CAPS Take 1,000 Units by mouth daily.    . ciclopirox (LOPROX) 0.77 % cream Apply topically 2 (two) times daily. 90 g 2  . ELIQUIS 2.5 MG TABS tablet TAKE 1 TABLET(2.5 MG) BY MOUTH TWICE DAILY 60 tablet 1  . FLUoxetine (PROZAC) 40 MG capsule TAKE 1 CAPSULE(40 MG) BY MOUTH DAILY 90 capsule 0  . hydrALAZINE (APRESOLINE) 25 MG tablet TAKE 1 TABLET(25 MG) BY MOUTH THREE TIMES DAILY 270 tablet 1  . levothyroxine (SYNTHROID) 75 MCG tablet TAKE 1 TABLET(75 MCG) BY MOUTH DAILY BEFORE BREAKFAST. FOLLOW-UP 30 tablet 2  . lisinopril (ZESTRIL) 2.5 MG tablet Take 2.5 mg by mouth daily.    . metoprolol tartrate (LOPRESSOR) 25 MG tablet TAKE 1 TABLET(25 MG) BY MOUTH TWICE DAILY 180 tablet 1  . nortriptyline (PAMELOR) 10 MG capsule TAKE 4 CAPSULES(40 MG) BY MOUTH AT BEDTIME 360 capsule 0  . torsemide (DEMADEX) 20 MG tablet Take 1 tablet (20 mg total) by mouth daily. 30 tablet 5  .  vitamin B-12 (CYANOCOBALAMIN) 100 MCG tablet Take 100 mcg by mouth daily.     No current facility-administered medications on file prior to visit.    Past Medical History:  Diagnosis Date  . Blood transfusion without reported diagnosis   . Chronic low back pain   . History of echocardiogram    Echo 5/18: EF 55-60, Gr 2 DD, mild MR, mild LAE, PASP 47 // Echo 1/18:  EF 55, mild AI, MAC, mild MR, mild LAE, trivial pericardial effusion  . History of ischemic left MCA  stroke 11/2016   left MCA infarct status post mechanical thrombectomy complicated by large left basal ganglia hemorrhage with right shift.  Marland Kitchen HTN (hypertension)   . Hyperlipidemia   . Hypothyroidism   . Osteopenia   . Persistent atrial fibrillation ([name])    CHADS2-VASc=6 (female, age 42, HTN, CVA) // Apixaban // rate control strategy     Past Surgical History:  Procedure Laterality Date  . ABDOMINAL HYSTERECTOMY  1970  . CHOLECYSTECTOMY  07/2009   Dr. Rise Patience  . COLONOSCOPY  2003  . FLEXIBLE SIGMOIDOSCOPY  2010  . HAND SURGERY    . IR ANGIO INTRA EXTRACRAN SEL COM CAROTID INNOMINATE UNI L MOD SED  11/29/2016  . IR ANGIO VERTEBRAL SEL SUBCLAVIAN INNOMINATE UNI R MOD SED  11/29/2016  . IR PERCUTANEOUS ART THROMBECTOMY/INFUSION INTRACRANIAL INC DIAG ANGIO  11/29/2016  . IR RADIOLOGIST EVAL & MGMT  03/24/2017  . LUMBAR LAMINECTOMY  11/2008   Done by Dr. Patrice Paradise  . RADIOLOGY WITH ANESTHESIA N/A 11/29/2016   Procedure: RADIOLOGY WITH ANESTHESIA;  Surgeon: Radiologist, Medication, MD;  Location: [facility];  Service: Radiology;  Laterality: N/A;  . THYROIDECTOMY      Social History   Socioeconomic History  . Marital status: Married    Spouse name: Not on file  . Number of children: 1  . Years of education: Not on file  . Highest education level: Not on file  Occupational History  . Occupation: retired  Tobacco Use  . Smoking status: Never Smoker  . Smokeless tobacco: Never Used  Vaping Use  . Vaping Use: Never used  Substance and Sexual Activity  . Alcohol use: No  . Drug use: No  . Sexual activity: Not Currently  Other Topics Concern  . Not on file  Social History Narrative   Married   Social Determinants of Health   Financial Resource Strain: Low Risk   . Difficulty of Paying Living Expenses: Not hard at all  Food Insecurity: No Food Insecurity  . Worried About Charity fundraiser in the Last Year: Never true  . Ran Out of Food in the Last Year: Never true  Transportation  Needs: No Transportation Needs  . Lack of Transportation (Medical): No  . Lack of Transportation (Non-Medical): No  Physical Activity: Sufficiently Active  . Days of Exercise per Week: 5 days  . Minutes of Exercise per Session: 30 min  Stress: No Stress Concern Present  . Feeling of Stress : Not at all  Social Connections: Socially Integrated  . Frequency of Communication with Friends and Family: More than three times a week  . Frequency of Social Gatherings with Friends and Family: Once a week  . Attends Religious Services: More than 4 times per year  . Active Member of Clubs or Organizations: No  . Attends Archivist Meetings: More than 4 times per year  . Marital Status: Married    Family History  Problem Relation Age  of Onset  . Heart disease Father 19       MI age 68s  . Lung cancer Brother 71  . Colon cancer Neg Hx   . Esophageal cancer Neg Hx   . Rectal cancer Neg Hx   . Stomach cancer Neg Hx     Review of Systems  Constitutional: Negative for chills and fever.  Respiratory: Negative for cough, shortness of breath and wheezing.   Cardiovascular: Positive for leg swelling (controlled). Negative for chest pain and palpitations.  Gastrointestinal: Negative for abdominal pain, blood in stool (no black stool), constipation, diarrhea and nausea.       No gerd  Neurological: Negative for light-headedness and headaches.       Objective:   Vitals:   12/12/20 1057  BP: 126/76  Pulse: 89  Temp: 98.2 F (36.8 C)  SpO2: 99%   BP Readings from Last 3 Encounters:  12/12/20 126/76  09/12/20 (!) 160/78  06/25/20 126/70   Wt Readings from Last 3 Encounters:  12/12/20 161 lb 6.4 oz (73.2 kg)  09/12/20 171 lb (77.6 kg)  06/25/20 168 lb 9.6 oz (76.5 kg)   Body mass index is 27.7 kg/m.   Physical Exam    Constitutional: Appears well-developed and well-nourished. No distress.  HENT:  Head: Normocephalic and atraumatic.  Neck: Neck supple. No tracheal  deviation present. No thyromegaly present.  No cervical lymphadenopathy Cardiovascular: Normal rate, irregular rhythm and normal heart sounds.   No murmur heard. No carotid bruit .  Trace b/l LE edema Pulmonary/Chest: Effort normal and breath sounds normal. No respiratory distress. No has no wheezes. No rales.  Skin: Skin is warm and dry. Not diaphoretic.  Psychiatric: Normal mood and affect. Behavior is normal.      Assessment & Plan:    See Problem List for Assessment and Plan of chronic medical problems.    This visit occurred during the SARS-CoV-2 public health emergency.  Safety protocols were in place, including screening questions prior to the visit, additional usage of staff PPE, and extensive cleaning of exam room while observing appropriate contact time as indicated for disinfecting solutions.

## 2020-12-11 NOTE — Patient Instructions (Addendum)
   Medications changes include :   none    Please followup in 6 months   

## 2020-12-12 ENCOUNTER — Other Ambulatory Visit: Payer: Self-pay

## 2020-12-12 ENCOUNTER — Ambulatory Visit (INDEPENDENT_AMBULATORY_CARE_PROVIDER_SITE_OTHER): Payer: HMO | Admitting: Internal Medicine

## 2020-12-12 ENCOUNTER — Encounter: Payer: Self-pay | Admitting: Internal Medicine

## 2020-12-12 VITALS — BP 126/76 | HR 89 | Temp 98.2°F | Ht 64.0 in | Wt 161.4 lb

## 2020-12-12 DIAGNOSIS — R7303 Prediabetes: Secondary | ICD-10-CM | POA: Diagnosis not present

## 2020-12-12 DIAGNOSIS — I4819 Other persistent atrial fibrillation: Secondary | ICD-10-CM | POA: Diagnosis not present

## 2020-12-12 DIAGNOSIS — N184 Chronic kidney disease, stage 4 (severe): Secondary | ICD-10-CM

## 2020-12-12 DIAGNOSIS — G479 Sleep disorder, unspecified: Secondary | ICD-10-CM

## 2020-12-12 DIAGNOSIS — I69351 Hemiplegia and hemiparesis following cerebral infarction affecting right dominant side: Secondary | ICD-10-CM

## 2020-12-12 DIAGNOSIS — E7849 Other hyperlipidemia: Secondary | ICD-10-CM

## 2020-12-12 DIAGNOSIS — I5032 Chronic diastolic (congestive) heart failure: Secondary | ICD-10-CM | POA: Diagnosis not present

## 2020-12-12 DIAGNOSIS — I10 Essential (primary) hypertension: Secondary | ICD-10-CM

## 2020-12-12 DIAGNOSIS — E039 Hypothyroidism, unspecified: Secondary | ICD-10-CM | POA: Diagnosis not present

## 2020-12-12 DIAGNOSIS — F3289 Other specified depressive episodes: Secondary | ICD-10-CM | POA: Diagnosis not present

## 2020-12-12 DIAGNOSIS — G8929 Other chronic pain: Secondary | ICD-10-CM

## 2020-12-12 DIAGNOSIS — R1011 Right upper quadrant pain: Secondary | ICD-10-CM

## 2020-12-12 DIAGNOSIS — R600 Localized edema: Secondary | ICD-10-CM | POA: Diagnosis not present

## 2020-12-12 NOTE — Assessment & Plan Note (Signed)
Chronic Controlled, stable Continue nortriptyline at night

## 2020-12-12 NOTE — Assessment & Plan Note (Signed)
Chronic  Clinically euthyroid Continue taking levothyroxine 75 mcg

## 2020-12-12 NOTE — Assessment & Plan Note (Addendum)
Chronic Euvolemic  Continue torsemide 20 mg qd, metoprolol 25 mg bid

## 2020-12-12 NOTE — Assessment & Plan Note (Signed)
Chronic Low sugar / carb diet Stressed regular exercise   

## 2020-12-12 NOTE — Assessment & Plan Note (Addendum)
Chronic Continue eliquis 2.5 mg bid, metoprolol 25 mg bid

## 2020-12-12 NOTE — Assessment & Plan Note (Signed)
Chronic Stable Continue torsemdie

## 2020-12-12 NOTE — Assessment & Plan Note (Signed)
Chronic continue eliquis 2.5 mg bid, atorvastatin 20 mg BP well controlled LDL controlled

## 2020-12-12 NOTE — Assessment & Plan Note (Signed)
Chronic Related to thoracic pain - radiculopathy Continue notriptyline 40 mg hs

## 2020-12-12 NOTE — Assessment & Plan Note (Signed)
Chronic Controlled, stable Continue prozac 40 mg daily 

## 2020-12-12 NOTE — Assessment & Plan Note (Signed)
Chronic Continue lipitor 20 mg qd Regular exercise and healthy diet encouraged

## 2020-12-12 NOTE — Assessment & Plan Note (Addendum)
Chronic BP well controlled Continue hydralazine 25 mg tid, lisinopril 2.5 mg qd, metoprolol 25 mg bid, torsemide 20 mg qd

## 2020-12-12 NOTE — Assessment & Plan Note (Signed)
Chronic Following with dr Allyson Sabal stable

## 2021-01-07 ENCOUNTER — Other Ambulatory Visit: Payer: Self-pay

## 2021-01-07 ENCOUNTER — Other Ambulatory Visit: Payer: Self-pay | Admitting: Internal Medicine

## 2021-01-07 ENCOUNTER — Ambulatory Visit (INDEPENDENT_AMBULATORY_CARE_PROVIDER_SITE_OTHER): Payer: HMO | Admitting: Pharmacist

## 2021-01-07 DIAGNOSIS — F3289 Other specified depressive episodes: Secondary | ICD-10-CM | POA: Diagnosis not present

## 2021-01-07 DIAGNOSIS — I4819 Other persistent atrial fibrillation: Secondary | ICD-10-CM

## 2021-01-07 DIAGNOSIS — G479 Sleep disorder, unspecified: Secondary | ICD-10-CM

## 2021-01-07 DIAGNOSIS — N184 Chronic kidney disease, stage 4 (severe): Secondary | ICD-10-CM

## 2021-01-07 DIAGNOSIS — I5032 Chronic diastolic (congestive) heart failure: Secondary | ICD-10-CM | POA: Diagnosis not present

## 2021-01-07 DIAGNOSIS — I10 Essential (primary) hypertension: Secondary | ICD-10-CM

## 2021-01-07 NOTE — Progress Notes (Signed)
Chronic Care Management Pharmacy Note  01/09/2021 Name:  Valerie Mcclure MRN:  [id number] DOB:  Jan 07, 1941  Summary: -Pt is doing well; endorses compliance with meds as prescribed  Recommendations/Changes made from today's visit: -Advised to get Shingrix vaccine at local pharmacy -Advised to take Calcium/Vitamin D for bone health    Subjective: Valerie Mcclure is an 80 y.o. year old female who is a primary patient of Burns, Claudina Lick, MD.  The CCM team was consulted for assistance with disease management and care coordination needs.    Engaged with patient by telephone for follow up visit in response to provider referral for pharmacy case management and/or care coordination services.   Consent to Services:  The patient was given information about Chronic Care Management services, agreed to services, and gave verbal consent prior to initiation of services.  Please see initial visit note for detailed documentation.   Patient Care Team: [name], MD as PCP - General (Internal Medicine) Nahser, Wonda Cheng, MD as PCP - Cardiology (Cardiology) Starling Manns, MD as Consulting Physician (Orthopedic Surgery) Nicholaus Bloom, MD as Consulting Physician (Anesthesiology) Gatha Mayer, MD as Consulting Physician (Gastroenterology) Charlton Haws, [facility] as Pharmacist (Pharmacist)  Recent office visits: 12/12/20 Dr Quay Burow OV: chronic f/u. Amlodipine was dc'd by Dr Hollie Salk.  06/10/20 Burns - PCP - F/U Persistent atrial fibrillation. Flu vaccine given. No med changes. Referral for kidney doctor ordered. F/u in 6 months.   09/12/20 John - Internal Medicine - follow up CKD, HTN, and worsening bilateral leg swelling. Nephro recently stopped furosemide and started lisinopril. Advised to Keep weight log an f/u in 1 week. Start Torsemide 20 mg daily.  Recent consult visits: 12/09/20 Dr Hollie Salk (nephrology): stopped amlodipine 08/15/20 Dr Hollie Salk (nephrology) started lisinopril 2.5 mg  daily  05/14/20 Dr Acie Fredrickson (cardiology): f/u Afib, prior stroke. In NSR. No med changes.  Hospital visits: None in previous 6 months  Objective:  Lab Results  Component Value Date   CREATININE 2.01 (H) 11/13/2020   BUN 35 (H) 11/13/2020   GFR 22.91 (L) 11/13/2020   GFRNONAA 36 (L) 07/13/2017   GFRAA 42 (L) 07/13/2017   NA 140 11/13/2020   K 3.8 11/13/2020   CALCIUM 9.8 11/13/2020   CO2 29 11/13/2020   GLUCOSE 106 (H) 11/13/2020    Lab Results  Component Value Date/Time   HGBA1C 5.9 06/10/2020 01:31 PM   HGBA1C 6.1 12/11/2019 02:49 PM   GFR 22.91 (L) 11/13/2020 02:52 PM   GFR 28.50 (L) 06/10/2020 01:31 PM    Last diabetic Eye exam: No results found for: HMDIABEYEEXA  Last diabetic Foot exam: No results found for: HMDIABFOOTEX   Lab Results  Component Value Date   CHOL 136 06/10/2020   HDL 53.20 06/10/2020   LDLCALC 51 06/10/2020   LDLDIRECT 82.0 06/08/2018   TRIG 163.0 (H) 06/10/2020   CHOLHDL 3 06/10/2020    Hepatic Function Latest Ref Rng & Units 06/10/2020 12/11/2019 06/13/2019  Total Protein 6.0 - 8.3 g/dL 7.7 7.3 7.6  Albumin 3.5 - 5.2 g/dL 4.4 4.4 4.1  AST 0 - 37 U/L $Remo'15 22 16  'jlKzh$ ALT 0 - 35 U/L $Remo'10 21 14  'DyRGB$ Alk Phosphatase 39 - 117 U/L 68 59 81  Total Bilirubin 0.2 - 1.2 mg/dL 0.4 0.4 0.4  Bilirubin, Direct 0.0 - 0.3 mg/dL - - -    Lab Results  Component Value Date/Time   TSH 0.70 06/10/2020 01:31 PM   TSH 2.33 12/11/2019 02:49 PM  FREET4 1.36 (H) 11/30/2016 01:46 PM    CBC Latest Ref Rng & Units 06/10/2020 12/11/2019 06/13/2019  WBC 4.0 - 10.5 K/uL 7.9 8.7 9.3  Hemoglobin 12.0 - 15.0 g/dL 13.8 12.6 13.1  Hematocrit 36.0 - 46.0 % 41.6 37.1 38.6  Platelets 150.0 - 400.0 K/uL 326.0 329.0 362.0    Lab Results  Component Value Date/Time   VD25OH 29 (L) 12/13/2008 09:58 PM    Clinical ASCVD: Yes  The ASCVD Risk score Mikey Bussing DC Jr., et al., 2013) failed to calculate for the following reasons:   The 2013 ASCVD risk score is only valid for ages 45 to 54     Depression screen PHQ 2/9 06/25/2020 02/22/2020 09/06/2019  Decreased Interest 0 0 0  Down, Depressed, Hopeless 0 0 0  PHQ - 2 Score 0 0 0  Altered sleeping - - -  Tired, decreased energy - - -  Change in appetite - - -  Feeling bad or failure about yourself  - - -  Trouble concentrating - - -  Moving slowly or fidgety/restless - - -  Suicidal thoughts - - -  PHQ-9 Score - - -  Difficult doing work/chores - - -  Some recent data might be hidden     CHA2DS2-VASc Score = 6  The patient's score is based upon: CHF History: No HTN History: Yes Diabetes History: No Stroke History: Yes Vascular Disease History: No Age Score: 2 Gender Score: 1      Social History   Tobacco Use  Smoking Status Never  Smokeless Tobacco Never   BP Readings from Last 3 Encounters:  12/12/20 126/76  09/12/20 (!) 160/78  06/25/20 126/70   Pulse Readings from Last 3 Encounters:  12/12/20 89  09/12/20 71  06/25/20 63   Wt Readings from Last 3 Encounters:  12/12/20 161 lb 6.4 oz (73.2 kg)  09/12/20 171 lb (77.6 kg)  06/25/20 168 lb 9.6 oz (76.5 kg)   BMI Readings from Last 3 Encounters:  12/12/20 27.70 kg/m  09/12/20 29.35 kg/m  06/25/20 28.94 kg/m    Assessment/Interventions: Review of patient past medical history, allergies, medications, health status, including review of consultants reports, laboratory and other test data, was performed as part of comprehensive evaluation and provision of chronic care management services.   SDOH:  (Social Determinants of Health) assessments and interventions performed: Yes  SDOH Screenings   Alcohol Screen: Low Risk    Last Alcohol Screening Score (AUDIT): 0  Depression (PHQ2-9): Low Risk    PHQ-2 Score: 0  Financial Resource Strain: Low Risk    Difficulty of Paying Living Expenses: Not hard at all  Food Insecurity: No Food Insecurity   Worried About Charity fundraiser in the Last Year: Never true   Ran Out of Food in the Last Year: Never  true  Housing: Low Risk    Last Housing Risk Score: 0  Physical Activity: Sufficiently Active   Days of Exercise per Week: 5 days   Minutes of Exercise per Session: 30 min  Social Connections: Engineer, building services of Communication with Friends and Family: More than three times a week   Frequency of Social Gatherings with Friends and Family: Once a week   Attends Religious Services: More than 4 times per year   Active Member of [employer] or Organizations: No   Attends Music therapist: More than 4 times per year   Marital Status: Married  Stress: No Stress Concern Present   Feeling  of Stress : Not at all  Tobacco Use: Low Risk    Smoking Tobacco Use: Never   Smokeless Tobacco Use: Never  Transportation Needs: No Transportation Needs   Lack of Transportation (Medical): No   Lack of Transportation (Non-Medical): No    CCM Care Plan  Allergies  Allergen Reactions   Shrimp [Shellfish Allergy] Anaphylaxis and Rash    Break outs and swelling of the throat   Valsartan Other (See Comments)    Renal failure   Tandem Plus [Fefum-Fepo-Fa-B Cmp-C-Zn-Mn-Cu] Nausea And Vomiting   Ivp Dye [Iodinated Diagnostic Agents] Rash    itching   Penicillins Itching and Rash    Has patient had a PCN reaction causing immediate rash, facial/tongue/throat swelling, SOB or lightheadedness with hypotension: Yes Has patient had a PCN reaction causing severe rash involving mucus membranes or skin necrosis: No Has patient had a PCN reaction that required hospitalization: No Has patient had a PCN reaction occurring within the last 10 years: No If all of the above answers are "NO", then may proceed with Cephalosporin use.     Medications Reviewed Today     Reviewed by Charlton Haws, [facility] (Pharmacist) on 01/09/21 at 1512  Med List Status: <None>   Medication Order Taking? Sig Documenting Provider Last Dose Status Informant  acetaminophen (TYLENOL) 500 MG tablet [id number] Yes Take  1,000 mg by mouth every 8 (eight) hours as needed. [provider] Taking Active   atorvastatin (LIPITOR) 20 MG tablet [id number] Yes TAKE 1 TABLET(20 MG) BY MOUTH DAILY Burns, Claudina Lick, MD Taking Active   Calcium Carbonate-Vit D-Min (CALCIUM 1200 PO) [id number] Yes Take 1 tablet by mouth daily. [provider] Taking Active Self  Cholecalciferol (VITAMIN D3) 1000 UNITS CAPS [id number] Yes Take 1,000 Units by mouth daily. [provider] Taking Active Self  ciclopirox (LOPROX) 0.77 % cream [id number] Yes Apply topically 2 (two) times daily. [name], MD Taking Active   ELIQUIS 2.5 MG TABS tablet [id number] Yes TAKE 1 TABLET(2.5 MG) BY MOUTH TWICE DAILY Burns, Claudina Lick, MD Taking Active   FLUoxetine (PROZAC) 40 MG capsule [id number] Yes TAKE 1 CAPSULE(40 MG) BY MOUTH DAILY Burns, Claudina Lick, MD Taking Active   hydrALAZINE (APRESOLINE) 25 MG tablet [id number] Yes TAKE 1 TABLET(25 MG) BY MOUTH THREE TIMES DAILY Burns, Claudina Lick, MD Taking Active   levothyroxine (SYNTHROID) 75 MCG tablet [id number] Yes TAKE 1 TABLET(75 MCG) BY MOUTH DAILY BEFORE BREAKFAST. FOLLOW-UP [name], MD Taking Active   lisinopril (ZESTRIL) 2.5 MG tablet [id number] Yes Take 2.5 mg by mouth daily. [provider] Taking Active   metoprolol tartrate (LOPRESSOR) 25 MG tablet [id number] Yes TAKE 1 TABLET(25 MG) BY MOUTH TWICE DAILY Burns, Claudina Lick, MD Taking Active   nortriptyline (PAMELOR) 10 MG capsule [id number] Yes TAKE 4 CAPSULES(40 MG) BY MOUTH AT BEDTIME [name], MD Taking Active   torsemide (DEMADEX) 20 MG tablet [id number] Yes Take 1 tablet (20 mg total) by mouth daily. Biagio Borg, MD Taking Active   vitamin B-12 (CYANOCOBALAMIN) 100 MCG tablet [id number] Yes Take 100 mcg by mouth daily. [provider] Taking Active Self            Patient Active Problem List   Diagnosis Date Noted   Acute on chronic diastolic (congestive) heart failure ([place]) 09/15/2020   Eye tearing, left  02/19/2020   TMJ syndrome, left 02/19/2020   Lump in neck 06/08/2018   Sleep difficulties 03/31/2018   Persistent atrial fibrillation (  [name]) 09/06/2017   TIA (transient ischemic attack) 07/12/2017   Bradycardia 04/12/2017   Depression 01/29/2017   Fall    Aphasia as late effect of cerebrovascular accident (CVA)    Chronic diastolic heart failure ([name])    Dysarthria, post-stroke    Atrial flutter ([name]) 12/22/2016   Spastic hemiplegia of right dominant side as late effect of cerebral infarction ([name])    Anemia of chronic disease    Hypertension    Dysphagia, post-stroke    Anterior cerebral circulation hemorrhagic infarction ([name]) 12/03/2016   Right hemiparesis ([name])    Nontraumatic subcortical hemorrhage of left cerebral hemisphere ([name])    Mild aortic regurgitation 08/20/2016   Bilateral edema of lower extremity 04/07/2016   Pancreatic duct dilated 01/09/2016   Stage 4 chronic kidney disease ([name]) 12/25/2015   Mild tricuspid regurgitation 12/25/2015   Mild mitral regurgitation 12/25/2015   Prediabetes 06/10/2015   Abdominal pain, chronic, right upper quadrant 06/04/2015   Chronic low back pain    Pain in thoracic spine 04/18/2010   Coronary atherosclerosis 01/24/2009   ARTHRITIS, LEFT KNEE 12/13/2008   Hypothyroidism 11/21/2007   Hyperlipidemia 11/09/2007   Osteopenia 11/09/2007    Immunization History  Administered Date(s) Administered   Fluad Quad(high Dose 65+) 04/15/2019, 06/10/2020   Influenza Split 04/22/2011, 05/11/2013   Influenza, High Dose Seasonal PF 06/04/2015, 04/07/2016, 04/12/2017, 03/31/2018   Influenza,inj,Quad PF,6+ Mos 04/03/2014   Moderna Sars-Covid-2 Vaccination 09/19/2019, 10/19/2019, 07/02/2020   Pneumococcal Conjugate-13 12/25/2015   Pneumococcal Polysaccharide-23 11/18/2010   Td 07/20/2005    Conditions to be addressed/monitored:  Hypertension, Hyperlipidemia, Atrial Fibrillation, Heart Failure, Chronic Kidney Disease, Hypothyroidism,  Depression and Osteopenia  Patient Care Plan: CCM Pharmacy Care Plan     Problem Identified: Hypertension, Hyperlipidemia, Atrial Fibrillation, Heart Failure, Chronic Kidney Disease, Hypothyroidism, Depression and Osteopenia   Priority: High     Long-Range Goal: Disease management   Start Date: 11/13/2020  Expected End Date: 05/15/2021  This Visit's Progress: On track  Recent Progress: On track  Priority: High  Note:   Current Barriers:  Unable to independently monitor therapeutic efficacy Suboptimal therapeutic regimen for CKD  Pharmacist Clinical Goal(s):  Patient will achieve adherence to monitoring guidelines and medication adherence to achieve therapeutic efficacy adhere to plan to optimize therapeutic regimen for CKD as evidenced by report of adherence to recommended medication management changes through collaboration with PharmD and provider.   Interventions: 1:1 collaboration with Pincus Sanes, MD regarding development and update of comprehensive plan of care as evidenced by provider attestation and co-signature Inter-disciplinary care team collaboration (see longitudinal plan of care) Comprehensive medication review performed; medication list updated in electronic medical record  Hyperlipidemia / hx TIA: (LDL goal < 70) -Controlled - LDL is at goal; pt denies side effects -Current treatment: Atorvastatin 20 mg daily -Educated on Cholesterol goals; Benefits of statin for ASCVD risk reduction; -Recommended to continue current medication  Atrial Fibrillation (Goal: prevent stroke and major bleeding) -Controlled - pt denies recent palpitations; she endorses compliance with medications and denies bleeding; she has submitted applications for Eliquis patient assistance and is awaiting determination -CHADSVASC: 6 -Current treatment: Rate control: Metoprolol tartrate 25 mg BID Anticoagulation: Eliquis 2.5 mg BID -Home BP and HR readings: 130/70s, HR 60s -Counseled on  increased risk of stroke due to Afib and benefits of anticoagulation for stroke prevention; importance of adherence to anticoagulant exactly as prescribed; bleeding risk associated with Eliquis and importance of self-monitoring for signs/symptoms of bleeding; -Recommended to continue current medication  Heart Failure / HTN / CKD (Goal: BP < 130/80, prevent exacerbations) -Controlled- pt is now taking 1 diuretic and no potassium after BMP showed normal K and stable kidney function -Last ejection fraction: 55-60% (Date: 11/2016) -HF type: Diastolic -Current treatment: Torsemide 20 mg daily Hydralazine 25 mg TID Lisinopril 2.5 mg daily Metoprolol tartrate 25 mg BID -Educated on Benefits of medications for managing symptoms and prolonging life; Importance of weighing daily; if you gain more than 3 pounds in one day or 5 pounds in one week, take extra 1/2 dose of torsemide; Proper diuretic administration and potassium supplementation  -Recommend to continue current medication  Depression/Anxiety (Goal: manage symptoms) -Controlled - pt reports mood is well controlled; she is sleeping well with nortriptyline at bedtime -Current treatment: Fluoxetine 40 mg daily Nortriptyline 10 mg - 4 caps HS -PHQ9: 0 (06/2020) -GAD7: not on file -Connected with PCP for mental health support -Educated on Benefits of medication for symptom control -Recommended to continue current medication  Osteopenia (Goal: prevent fractures) -Not ideally controlled - pt is not taking ca/vitamin D -Last DEXA Scan: 2015  T-Score femoral neck: +0.5  T-Score forearm radius: +1.2 -Patient is not a candidate for pharmacologic treatment -Recommend [phone] units of vitamin D daily. Recommend 1200 mg of calcium daily from dietary and supplemental sources.  Hypothyroidism (Goal: maintain TSH in goal range) -Controlled - TSH is within goal range; pt takes levothyroxine with other meds in the morning -Current treatment   Levothyroxine 75 mcg daily -Recommended to continue current medication  Health Maintenance -Vaccine gaps: Shingrix -Current therapy:  Vitamin B12 100 mcg daily Tylenol 500 mg QID -Patient is satisfied with current therapy and denies issues -Recommended Tylenol up to 3000 mg/day, lidocaine patch or Voltaren gel as needed  -Recommended Shingrix vaccine at pharmacy  Patient Goals/Self-Care Activities Patient will:  - take medications as prescribed -focus on medication adherence by routine -check blood pressure daily, document, and provide at future appointments  -Weigh daily; if you gain more than 3 pounds in one day or 5 pounds in one week, take extra 1/2 dose of torsemide. Call PCP if you take more than 2 extra doses in a week. -Get Shingrix vaccine at local pharmacy -Start Calcium-Vitamin D supplement for bone health       Medication Assistance:  None - pt reports coverage meets needs  Compliance/Adherence/Medication fill history: Care Gaps: Shingrix Covid booster (due 10/31/20)  Star-Rating Drugs: Atorvastatin - LF 12/09/20 x 90 ds Lisinopril - LF 12/11/20 x 30 ds  Patient's preferred pharmacy is:  [id number] - [address], [address] [place] [address] Phone: [phone] Fax: [phone]  Uses pill box? No - prefes bottles Pt endorses 100% compliance  We discussed: Current pharmacy is preferred with insurance plan and patient is satisfied with pharmacy services Patient decided to: Continue current medication management strategy  Care Plan and Follow Up Patient Decision:  Patient agrees to Care Plan and Follow-up.  Plan: Telephone follow up appointment with care management team member scheduled for:  6 months  Charlene Brooke, PharmD, [place], CPP Clinical Pharmacist [facility] at [facility] [phone]

## 2021-01-09 NOTE — Patient Instructions (Signed)
Visit Information  Phone number for Pharmacist: [phone]   Goals Addressed             This Visit's Progress    Manage My Medicine       Timeframe:  Long-Range Goal Priority:  High Start Date:    11/13/20                         Expected End Date:  02/12/21                     Follow Up Date 01/16/21   - call for medicine refill 2 or 3 days before it runs out - call if I am sick and can't take my medicine - keep a list of all the medicines I take; vitamins and herbals too  -Weigh daily; if you gain more than 3 pounds in one day or 5 pounds in one week, take extra 1/2 dose of torsemide. Call PCP if you take more than 2 extra doses in a week. -Get Shingrix vaccine at local pharmacy -Start Calcium-Vitamin D supplement for bone health    Why is this important?   These steps will help you keep on track with your medicines.   Notes:          The patient verbalized understanding of instructions, educational materials, and care plan provided today and declined offer to receive copy of patient instructions, educational materials, and care plan.  Telephone follow up appointment with pharmacy team member scheduled for:  Charlene Brooke, PharmD, BCACP, CPP Clinical Pharmacist [facility] at [facility] [phone]

## 2021-01-10 ENCOUNTER — Other Ambulatory Visit: Payer: Self-pay | Admitting: Internal Medicine

## 2021-01-27 ENCOUNTER — Other Ambulatory Visit: Payer: Self-pay | Admitting: Internal Medicine

## 2021-02-04 ENCOUNTER — Encounter: Payer: Self-pay | Admitting: Emergency Medicine

## 2021-02-04 ENCOUNTER — Ambulatory Visit (INDEPENDENT_AMBULATORY_CARE_PROVIDER_SITE_OTHER): Payer: HMO | Admitting: Emergency Medicine

## 2021-02-04 ENCOUNTER — Other Ambulatory Visit: Payer: Self-pay

## 2021-02-04 VITALS — BP 140/76 | HR 74 | Temp 98.1°F | Ht 64.0 in | Wt 161.0 lb

## 2021-02-04 DIAGNOSIS — S51852A Open bite of left forearm, initial encounter: Secondary | ICD-10-CM

## 2021-02-04 DIAGNOSIS — W540XXA Bitten by dog, initial encounter: Secondary | ICD-10-CM

## 2021-02-04 DIAGNOSIS — R21 Rash and other nonspecific skin eruption: Secondary | ICD-10-CM | POA: Diagnosis not present

## 2021-02-04 MED ORDER — PREDNISONE 20 MG PO TABS: 20.0000 mg | ORAL_TABLET | Freq: Every day | ORAL | 0 refills | Status: DC

## 2021-02-04 MED ORDER — MUPIROCIN CALCIUM 2 % EX CREA: 1.0000 "application " | TOPICAL_CREAM | Freq: Two times a day (BID) | CUTANEOUS | 0 refills | Status: DC

## 2021-02-04 NOTE — Progress Notes (Signed)
Valerie Mcclure 80 y.o.   Chief Complaint  Patient presents with   Rash    Arms and legs, pt states no itching or pain from rash. X 2 wk    HISTORY OF PRESENT ILLNESS: This is a 80 y.o. female complaining of persistent rash to arms and legs for the past 2 weeks.  Denies pain or itching. Also has dog bite to left forearm. No other complaints or medical concerns today.  Rash Pertinent negatives include no congestion, cough, diarrhea, fever, shortness of breath, sore throat or vomiting.    Prior to Admission medications   Medication Sig Start Date End Date Taking? Authorizing Provider  acetaminophen (TYLENOL) 500 MG tablet Take 1,000 mg by mouth every 8 (eight) hours as needed.   Yes [provider]  atorvastatin (LIPITOR) 20 MG tablet TAKE 1 TABLET(20 MG) BY MOUTH DAILY 06/17/20  Yes Burns, Claudina Lick, MD  Calcium Carbonate-Vit D-Min (CALCIUM 1200 PO) Take 1 tablet by mouth daily.   Yes [provider]  Cholecalciferol (VITAMIN D3) 1000 UNITS CAPS Take 1,000 Units by mouth daily.   Yes [provider]  ciclopirox (LOPROX) 0.77 % cream Apply topically 2 (two) times daily. 04/25/20  Yes Burns, Claudina Lick, MD  ELIQUIS 2.5 MG TABS tablet TAKE 1 TABLET(2.5 MG) BY MOUTH TWICE DAILY 01/10/21  Yes Burns, Claudina Lick, MD  FLUoxetine (PROZAC) 40 MG capsule TAKE 1 CAPSULE(40 MG) BY MOUTH DAILY 12/09/20  Yes Burns, Claudina Lick, MD  hydrALAZINE (APRESOLINE) 25 MG tablet TAKE 1 TABLET(25 MG) BY MOUTH THREE TIMES DAILY 01/27/21  Yes Burns, Claudina Lick, MD  levothyroxine (SYNTHROID) 75 MCG tablet TAKE 1 TABLET(75 MCG) BY MOUTH DAILY BEFORE BREAKFAST. FOLLOW-UP 12/10/20  Yes Burns, Claudina Lick, MD  lisinopril (ZESTRIL) 2.5 MG tablet Take 2.5 mg by mouth daily. 08/19/20  Yes [provider]  metoprolol tartrate (LOPRESSOR) 25 MG tablet TAKE 1 TABLET(25 MG) BY MOUTH TWICE DAILY 12/09/20  Yes Burns, Claudina Lick, MD  nortriptyline (PAMELOR) 10 MG capsule TAKE 4 CAPSULES(40 MG) BY MOUTH AT BEDTIME  01/08/21  Yes Burns, Claudina Lick, MD  torsemide (DEMADEX) 20 MG tablet Take 1 tablet (20 mg total) by mouth daily. 09/12/20  Yes Biagio Borg, MD  vitamin B-12 (CYANOCOBALAMIN) 100 MCG tablet Take 100 mcg by mouth daily.   Yes [provider]    Allergies  Allergen Reactions   Shrimp [Shellfish Allergy] Anaphylaxis and Rash    Break outs and swelling of the throat   Valsartan Other (See Comments)    Renal failure   Tandem Plus [Fefum-Fepo-Fa-B Cmp-C-Zn-Mn-Cu] Nausea And Vomiting   Ivp Dye [Iodinated Diagnostic Agents] Rash    itching   Penicillins Itching and Rash    Has patient had a PCN reaction causing immediate rash, facial/tongue/throat swelling, SOB or lightheadedness with hypotension: Yes Has patient had a PCN reaction causing severe rash involving mucus membranes or skin necrosis: No Has patient had a PCN reaction that required hospitalization: No Has patient had a PCN reaction occurring within the last 10 years: No If all of the above answers are "NO", then may proceed with Cephalosporin use.     Patient Active Problem List   Diagnosis Date Noted   Acute on chronic diastolic (congestive) heart failure ([place]) 09/15/2020   Eye tearing, left 02/19/2020   TMJ syndrome, left 02/19/2020   Lump in neck 06/08/2018   Sleep difficulties 03/31/2018   Persistent atrial fibrillation ([place]) 09/06/2017   TIA (transient ischemic attack) 07/12/2017  Bradycardia 04/12/2017   Depression 01/29/2017   Fall    Aphasia as late effect of cerebrovascular accident (CVA)    Chronic diastolic heart failure ([place])    Dysarthria, post-stroke    Atrial flutter ([place]) 12/22/2016   Spastic hemiplegia of right dominant side as late effect of cerebral infarction ([place])    Anemia of chronic disease    Hypertension    Dysphagia, post-stroke    Anterior cerebral circulation hemorrhagic infarction ([place]) 12/03/2016   Right hemiparesis ([place])    Nontraumatic subcortical hemorrhage of left cerebral  hemisphere ([name])    Mild aortic regurgitation 08/20/2016   Bilateral edema of lower extremity 04/07/2016   Pancreatic duct dilated 01/09/2016   Stage 4 chronic kidney disease ([place]) 12/25/2015   Mild tricuspid regurgitation 12/25/2015   Mild mitral regurgitation 12/25/2015   Prediabetes 06/10/2015   Abdominal pain, chronic, right upper quadrant 06/04/2015   Chronic low back pain    Pain in thoracic spine 04/18/2010   Coronary atherosclerosis 01/24/2009   ARTHRITIS, LEFT KNEE 12/13/2008   Hypothyroidism 11/21/2007   Hyperlipidemia 11/09/2007   Osteopenia 11/09/2007    Past Medical History:  Diagnosis Date   Blood transfusion without reported diagnosis    Chronic low back pain    History of echocardiogram    Echo 5/18: EF 55-60, Gr 2 DD, mild MR, mild LAE, PASP 47 // Echo 1/18:  EF 55, mild AI, MAC, mild MR, mild LAE, trivial pericardial effusion   History of ischemic left MCA stroke 11/2016   left MCA infarct status post mechanical thrombectomy complicated by large left basal ganglia hemorrhage with right shift.   HTN (hypertension)    Hyperlipidemia    Hypothyroidism    Osteopenia    Persistent atrial fibrillation ([name])    CHADS2-VASc=6 (female, age 87, HTN, CVA) // Apixaban // rate control strategy     Past Surgical History:  Procedure Laterality Date   ABDOMINAL HYSTERECTOMY  1970   CHOLECYSTECTOMY  07/2009   Dr. Rise Patience   COLONOSCOPY  2003   [place]  2010   HAND SURGERY     IR ANGIO INTRA EXTRACRAN SEL COM CAROTID INNOMINATE UNI L MOD SED  11/29/2016   IR ANGIO VERTEBRAL SEL SUBCLAVIAN INNOMINATE UNI R MOD SED  11/29/2016   IR PERCUTANEOUS ART THROMBECTOMY/INFUSION INTRACRANIAL INC DIAG ANGIO  11/29/2016   IR RADIOLOGIST EVAL & MGMT  03/24/2017   LUMBAR LAMINECTOMY  11/2008   Done by Dr. Patrice Paradise   RADIOLOGY WITH ANESTHESIA N/A 11/29/2016   Procedure: RADIOLOGY WITH ANESTHESIA;  Surgeon: Radiologist, Medication, MD;  Location: [facility];  Service: Radiology;   Laterality: N/A;   THYROIDECTOMY      Social History   Socioeconomic History   Marital status: Married    Spouse name: Not on file   Number of children: 1   Years of education: Not on file   Highest education level: Not on file  Occupational History   Occupation: retired  Tobacco Use   Smoking status: Never   Smokeless tobacco: Never  Vaping Use   Vaping Use: Never used  Substance and Sexual Activity   Alcohol use: No   Drug use: No   Sexual activity: Not Currently  Other Topics Concern   Not on file  Social History Narrative   Married   Social Determinants of Health   Financial Resource Strain: Low Risk    Difficulty of Paying Living Expenses: Not hard at all  Food Insecurity: No Food Insecurity  Worried About Charity fundraiser in the Last Year: Never true   [place] in the Last Year: Never true  Transportation Needs: No Transportation Needs   Lack of Transportation (Medical): No   Lack of Transportation (Non-Medical): No  Physical Activity: Sufficiently Active   Days of Exercise per Week: 5 days   Minutes of Exercise per Session: 30 min  Stress: No Stress Concern Present   Feeling of Stress : Not at all  Social Connections: Socially Integrated   Frequency of Communication with Friends and Family: More than three times a week   Frequency of Social Gatherings with Friends and Family: Once a week   Attends Religious Services: More than 4 times per year   Active Member of [employer] or Organizations: No   Attends Music therapist: More than 4 times per year   Marital Status: Married  Human resources officer Violence: Not on file    Family History  Problem Relation Age of Onset   Heart disease Father 32       MI age 84s   Lung cancer Brother 4   Colon cancer Neg Hx    Esophageal cancer Neg Hx    Rectal cancer Neg Hx    Stomach cancer Neg Hx      Review of Systems  Constitutional: Negative.  Negative for chills and fever.  HENT:  Negative for  congestion and sore throat.   Respiratory: Negative.  Negative for cough and shortness of breath.   Cardiovascular:  Positive for palpitations. Negative for chest pain.  Gastrointestinal:  Negative for abdominal pain, diarrhea, nausea and vomiting.  Genitourinary: Negative.  Negative for dysuria and hematuria.  Skin:  Positive for rash.  Neurological:  Negative for dizziness and headaches.  Endo/Heme/Allergies: Negative.   All other systems reviewed and are negative.   Physical Exam Vitals reviewed.  Constitutional:      Appearance: Normal appearance.  HENT:     Head: Normocephalic.  Eyes:     Extraocular Movements: Extraocular movements intact.     Pupils: Pupils are equal, round, and reactive to light.  Cardiovascular:     Rate and Rhythm: Normal rate.  Pulmonary:     Effort: Pulmonary effort is normal.  Skin:    General: Skin is warm and dry.     Capillary Refill: Capillary refill takes less than 2 seconds.     Findings: Rash present.     Comments: Maculopapular rash to upper and lower extremities, mostly exposed areas of the skin.  No rash on covered areas. Left forearm shows recent dog bite with erythema and mild swelling, possible early infection  Neurological:     General: No focal deficit present.     Mental Status: She is alert and oriented to person, place, and time.  Psychiatric:        Mood and Affect: Mood normal.        Behavior: Behavior normal.     ASSESSMENT & PLAN: Saphronia was seen today for rash.  Diagnoses and all orders for this visit:  Rash Comments: Most likely allergic in nature Orders: -     predniSONE (DELTASONE) 20 MG tablet; Take 1 tablet (20 mg total) by mouth daily with breakfast for 5 days. -     Ambulatory referral to Dermatology  Dog bite of left forearm, initial encounter -     mupirocin cream (BACTROBAN) 2 %; Apply 1 application topically 2 (two) times daily.  Patient Instructions  Rash, Adult  A rash is a change in the  color of your skin. A rash can also change the way your skin feels. There are many different conditions and factors that can causea rash. Follow these instructions at home: The goal of treatment is to stop the itching and keep the rash from spreading. Watch for any changes in your symptoms. Let your doctor know about them. Followthese instructions to help with your condition: Medicine Take or apply over-the-counter and prescription medicines only as told by your doctor. These may include medicines: To treat red or swollen skin (corticosteroid creams). To treat itching. To treat an allergy (oral antihistamines). To treat very bad symptoms (oral corticosteroids).  Skin care Put cool cloths (compresses) on the affected areas. Do not scratch or rub your skin. Avoid covering the rash. Make sure that the rash is exposed to air as much as possible. Managing itching and discomfort Avoid hot showers or baths. These can make itching worse. A cold shower may help. Try taking a bath with: Epsom salts. You can get these at your local pharmacy or grocery store. Follow the instructions on the package. Baking soda. Pour a small amount into the bath as told by your doctor. Colloidal oatmeal. You can get this at your local pharmacy or grocery store. Follow the instructions on the package. Try putting baking soda paste onto your skin. Stir water into baking soda until it gets like a paste. Try putting on a lotion that relieves itchiness (calamine lotion). Keep cool and out of the sun. Sweating and being hot can make itching worse. General instructions  Rest as needed. Drink enough fluid to keep your pee (urine) pale yellow. Wear loose-fitting clothing. Avoid scented soaps, detergents, and perfumes. Use gentle soaps, detergents, perfumes, and other cosmetic products. Avoid anything that causes your rash. Keep a journal to help track what causes your rash. Write down: What you eat. What cosmetic products  you use. What you drink. What you wear. This includes jewelry. Keep all follow-up visits as told by your doctor. This is important.  Contact a doctor if: You sweat at night. You lose weight. You pee (urinate) more than normal. You pee less than normal, or you notice that your pee is a darker color than normal. You feel weak. You throw up (vomit). Your skin or the whites of your eyes look yellow (jaundice). Your skin: Tingles. Is numb. Your rash: Does not go away after a few days. Gets worse. You are: More thirsty than normal. More tired than normal. You have: New symptoms. Pain in your belly (abdomen). A fever. Watery poop (diarrhea). Get help right away if: You have a fever and your symptoms suddenly get worse. You start to feel mixed up (confused). You have a very bad headache or a stiff neck. You have very bad joint pains or stiffness. You have jerky movements that you cannot control (seizure). Your rash covers all or most of your body. The rash may or may not be painful. You have blisters that: Are on top of the rash. Grow larger. Grow together. Are painful. Are inside your nose or mouth. You have a rash that: Looks like purple pinprick-sized spots all over your body. Has a "bull's eye" or looks like a target. Is red and painful, causes your skin to peel, and is not from being in the sun too long. Summary A rash is a change in the color of your skin. A rash can also change the way your skin feels. The goal  of treatment is to stop the itching and keep the rash from spreading. Take or apply over-the-counter and prescription medicines only as told by your doctor. Contact a doctor if you have new symptoms or symptoms that get worse. Keep all follow-up visits as told by your doctor. This is important. This information is not intended to replace advice given to you by your health care provider. Make sure you discuss any questions you have with your healthcare  provider. Document Revised: 10/28/2018 Document Reviewed: 02/07/2018 Elsevier Patient Education  2022 [place], MD [facility]

## 2021-02-04 NOTE — Patient Instructions (Signed)
Rash, Adult  A rash is a change in the color of your skin. A rash can also change the way your skin feels. There are many different conditions and factors that can causea rash. Follow these instructions at home: The goal of treatment is to stop the itching and keep the rash from spreading. Watch for any changes in your symptoms. Let your doctor know about them. Followthese instructions to help with your condition: Medicine Take or apply over-the-counter and prescription medicines only as told by your doctor. These may include medicines: To treat red or swollen skin (corticosteroid creams). To treat itching. To treat an allergy (oral antihistamines). To treat very bad symptoms (oral corticosteroids).  Skin care Put cool cloths (compresses) on the affected areas. Do not scratch or rub your skin. Avoid covering the rash. Make sure that the rash is exposed to air as much as possible. Managing itching and discomfort Avoid hot showers or baths. These can make itching worse. A cold shower may help. Try taking a bath with: Epsom salts. You can get these at your local pharmacy or grocery store. Follow the instructions on the package. Baking soda. Pour a small amount into the bath as told by your doctor. Colloidal oatmeal. You can get this at your local pharmacy or grocery store. Follow the instructions on the package. Try putting baking soda paste onto your skin. Stir water into baking soda until it gets like a paste. Try putting on a lotion that relieves itchiness (calamine lotion). Keep cool and out of the sun. Sweating and being hot can make itching worse. General instructions  Rest as needed. Drink enough fluid to keep your pee (urine) pale yellow. Wear loose-fitting clothing. Avoid scented soaps, detergents, and perfumes. Use gentle soaps, detergents, perfumes, and other cosmetic products. Avoid anything that causes your rash. Keep a journal to help track what causes your rash. Write  down: What you eat. What cosmetic products you use. What you drink. What you wear. This includes jewelry. Keep all follow-up visits as told by your doctor. This is important.  Contact a doctor if: You sweat at night. You lose weight. You pee (urinate) more than normal. You pee less than normal, or you notice that your pee is a darker color than normal. You feel weak. You throw up (vomit). Your skin or the whites of your eyes look yellow (jaundice). Your skin: Tingles. Is numb. Your rash: Does not go away after a few days. Gets worse. You are: More thirsty than normal. More tired than normal. You have: New symptoms. Pain in your belly (abdomen). A fever. Watery poop (diarrhea). Get help right away if: You have a fever and your symptoms suddenly get worse. You start to feel mixed up (confused). You have a very bad headache or a stiff neck. You have very bad joint pains or stiffness. You have jerky movements that you cannot control (seizure). Your rash covers all or most of your body. The rash may or may not be painful. You have blisters that: Are on top of the rash. Grow larger. Grow together. Are painful. Are inside your nose or mouth. You have a rash that: Looks like purple pinprick-sized spots all over your body. Has a "bull's eye" or looks like a target. Is red and painful, causes your skin to peel, and is not from being in the sun too long. Summary A rash is a change in the color of your skin. A rash can also change the way your skin feels.   The goal of treatment is to stop the itching and keep the rash from spreading. Take or apply over-the-counter and prescription medicines only as told by your doctor. Contact a doctor if you have new symptoms or symptoms that get worse. Keep all follow-up visits as told by your doctor. This is important. This information is not intended to replace advice given to you by your health care provider. Make sure you discuss any  questions you have with your healthcare provider. Document Revised: 10/28/2018 Document Reviewed: 02/07/2018 Elsevier Patient Education  2022 Elsevier Inc.  

## 2021-02-13 ENCOUNTER — Telehealth: Payer: Self-pay | Admitting: Internal Medicine

## 2021-02-13 DIAGNOSIS — R21 Rash and other nonspecific skin eruption: Secondary | ICD-10-CM

## 2021-02-13 NOTE — Telephone Encounter (Signed)
   Patient / daughter requesting additional medication for rash, prescribed by Dr Mitchel Honour  on 7/19.   CO:[id number] 20 mg Oral Daily with breakfast [id number] - [place], [place] - [address] AT [facility] OF [address]  Dr Mitchel Honour out of office/ Dr Quay Burow patient

## 2021-02-14 MED ORDER — PREDNISONE 20 MG PO TABS: 20.0000 mg | ORAL_TABLET | Freq: Every day | ORAL | 0 refills | Status: AC

## 2021-02-14 NOTE — Telephone Encounter (Signed)
Did the prednisone help at all?  Xarelto she been taking or has anything else helped?  Does she have an appointment with dermatology?

## 2021-02-14 NOTE — Telephone Encounter (Signed)
Sent to [facility]

## 2021-02-14 NOTE — Telephone Encounter (Signed)
See below

## 2021-03-09 ENCOUNTER — Other Ambulatory Visit: Payer: Self-pay | Admitting: Internal Medicine

## 2021-03-09 NOTE — Telephone Encounter (Signed)
Ok to pcp, thanks ?

## 2021-03-11 ENCOUNTER — Other Ambulatory Visit: Payer: Self-pay | Admitting: Internal Medicine

## 2021-03-13 ENCOUNTER — Other Ambulatory Visit: Payer: Self-pay | Admitting: Internal Medicine

## 2021-04-05 ENCOUNTER — Other Ambulatory Visit: Payer: Self-pay | Admitting: Internal Medicine

## 2021-04-10 ENCOUNTER — Other Ambulatory Visit: Payer: Self-pay | Admitting: Internal Medicine

## 2021-05-12 ENCOUNTER — Other Ambulatory Visit: Payer: Self-pay | Admitting: Internal Medicine

## 2021-06-08 ENCOUNTER — Other Ambulatory Visit: Payer: Self-pay | Admitting: Internal Medicine

## 2021-06-09 ENCOUNTER — Other Ambulatory Visit: Payer: Self-pay | Admitting: Internal Medicine

## 2021-06-11 ENCOUNTER — Other Ambulatory Visit: Payer: Self-pay | Admitting: Internal Medicine

## 2021-06-15 ENCOUNTER — Encounter: Payer: Self-pay | Admitting: Internal Medicine

## 2021-06-15 NOTE — Patient Instructions (Signed)
  Blood work was ordered.     Medications changes include :     Your prescription(s) have been submitted to your pharmacy. Please take as directed and contact our office if you believe you are having problem(s) with the medication(s).   A referral was ordered for        Someone from their office will call you to schedule an appointment.    Please followup in 6 months  

## 2021-06-15 NOTE — Progress Notes (Signed)
Subjective:    Patient ID: Valerie Mcclure, female    DOB: 07/07/1941, 80 y.o.   MRN: [id number]  This visit occurred during the SARS-CoV-2 public health emergency.  Safety protocols were in place, including screening questions prior to the visit, additional usage of staff PPE, and extensive cleaning of exam room while observing appropriate contact time as indicated for disinfecting solutions.     HPI The patient is here for follow up of their chronic medical problems, including CAD, Afib, htn, hld, hypothyroidism, CKD, h/o CVA w/ dysphasia, R sided weakness, depression, prediabetes, chronic RUQ pain from thoracic neuropathy, insomnia, LE edema  She is exercising.   Medications and allergies reviewed with patient and updated if appropriate.  Patient Active Problem List   Diagnosis Date Noted  . Eye tearing, left 02/19/2020  . TMJ syndrome, left 02/19/2020  . Lump in neck 06/08/2018  . Sleep difficulties 03/31/2018  . Persistent atrial fibrillation ([place]) 09/06/2017  . TIA (transient ischemic attack) 07/12/2017  . Bradycardia 04/12/2017  . Depression 01/29/2017  . Fall   . Aphasia as late effect of cerebrovascular accident (CVA)   . Chronic diastolic heart failure ([place])   . Dysarthria, post-stroke   . Atrial flutter ([place]) 12/22/2016  . Spastic hemiplegia of right dominant side as late effect of cerebral infarction ([place])   . Anemia of chronic disease   . Hypertension   . Dysphagia, post-stroke   . Anterior cerebral circulation hemorrhagic infarction ([place]) 12/03/2016  . Right hemiparesis ([place])   . Nontraumatic subcortical hemorrhage of left cerebral hemisphere ([place])   . Mild aortic regurgitation 08/20/2016  . Bilateral edema of lower extremity 04/07/2016  . Pancreatic duct dilated 01/09/2016  . Stage 4 chronic kidney disease ([place]) 12/25/2015  . Mild tricuspid regurgitation 12/25/2015  . Mild mitral regurgitation 12/25/2015  . Prediabetes 06/10/2015  . Abdominal  pain, chronic, right upper quadrant 06/04/2015  . Chronic low back pain   . Pain in thoracic spine 04/18/2010  . Coronary atherosclerosis 01/24/2009  . ARTHRITIS, LEFT KNEE 12/13/2008  . Hypothyroidism 11/21/2007  . Hyperlipidemia 11/09/2007  . Osteopenia 11/09/2007    Current Outpatient Medications on File Prior to Visit  Medication Sig Dispense Refill  . acetaminophen (TYLENOL) 500 MG tablet Take 1,000 mg by mouth every 8 (eight) hours as needed.    Marland Kitchen atorvastatin (LIPITOR) 20 MG tablet TAKE 1 TABLET(20 MG) BY MOUTH DAILY 90 tablet 3  . Calcium Carbonate-Vit D-Min (CALCIUM 1200 PO) Take 1 tablet by mouth daily.    . Cholecalciferol (VITAMIN D3) 1000 UNITS CAPS Take 1,000 Units by mouth daily.    . ciclopirox (LOPROX) 0.77 % cream Apply topically 2 (two) times daily. 90 g 2  . FLUoxetine (PROZAC) 40 MG capsule TAKE 1 CAPSULE(40 MG) BY MOUTH DAILY 90 capsule 0  . hydrALAZINE (APRESOLINE) 25 MG tablet TAKE 1 TABLET(25 MG) BY MOUTH THREE TIMES DAILY 270 tablet 1  . levothyroxine (SYNTHROID) 75 MCG tablet TAKE 1 TABLET(75 MCG) BY MOUTH DAILY BEFORE BREAKFAST. FOLLOW-UP 30 tablet 2  . lisinopril (ZESTRIL) 2.5 MG tablet Take 2.5 mg by mouth daily.    . metoprolol tartrate (LOPRESSOR) 25 MG tablet TAKE 1 TABLET(25 MG) BY MOUTH TWICE DAILY 180 tablet 1  . mupirocin cream (BACTROBAN) 2 % Apply 1 application topically 2 (two) times daily. 15 g 0  . torsemide (DEMADEX) 20 MG tablet TAKE 1 TABLET(20 MG) BY MOUTH DAILY 30 tablet 5  . vitamin B-12 (CYANOCOBALAMIN) 100 MCG tablet  Take 100 mcg by mouth daily.     No current facility-administered medications on file prior to visit.    Past Medical History:  Diagnosis Date  . Blood transfusion without reported diagnosis   . Chronic low back pain   . History of echocardiogram    Echo 5/18: EF 55-60, Gr 2 DD, mild MR, mild LAE, PASP 47 // Echo 1/18:  EF 55, mild AI, MAC, mild MR, mild LAE, trivial pericardial effusion  . History of ischemic left MCA  stroke 11/2016   left MCA infarct status post mechanical thrombectomy complicated by large left basal ganglia hemorrhage with right shift.  Marland Kitchen HTN (hypertension)   . Hyperlipidemia   . Hypothyroidism   . Osteopenia   . Persistent atrial fibrillation ([name])    CHADS2-VASc=6 (female, age 27, HTN, CVA) // Apixaban // rate control strategy     Past Surgical History:  Procedure Laterality Date  . ABDOMINAL HYSTERECTOMY  1970  . CHOLECYSTECTOMY  07/2009   Dr. Rise Patience  . COLONOSCOPY  2003  . FLEXIBLE SIGMOIDOSCOPY  2010  . HAND SURGERY    . IR ANGIO INTRA EXTRACRAN SEL COM CAROTID INNOMINATE UNI L MOD SED  11/29/2016  . IR ANGIO VERTEBRAL SEL SUBCLAVIAN INNOMINATE UNI R MOD SED  11/29/2016  . IR PERCUTANEOUS ART THROMBECTOMY/INFUSION INTRACRANIAL INC DIAG ANGIO  11/29/2016  . IR RADIOLOGIST EVAL & MGMT  03/24/2017  . LUMBAR LAMINECTOMY  11/2008   Done by Dr. Patrice Paradise  . RADIOLOGY WITH ANESTHESIA N/A 11/29/2016   Procedure: RADIOLOGY WITH ANESTHESIA;  Surgeon: Radiologist, Medication, MD;  Location: [facility];  Service: Radiology;  Laterality: N/A;  . THYROIDECTOMY      Social History   Socioeconomic History  . Marital status: Married    Spouse name: Not on file  . Number of children: 1  . Years of education: Not on file  . Highest education level: Not on file  Occupational History  . Occupation: retired  Tobacco Use  . Smoking status: Never  . Smokeless tobacco: Never  Vaping Use  . Vaping Use: Never used  Substance and Sexual Activity  . Alcohol use: No  . Drug use: No  . Sexual activity: Not Currently  Other Topics Concern  . Not on file  Social History Narrative   Married   Social Determinants of Health   Financial Resource Strain: Not on file  Food Insecurity: Not on file  Transportation Needs: Not on file  Physical Activity: Not on file  Stress: Not on file  Social Connections: Not on file    Family History  Problem Relation Age of Onset  . Heart disease Father 8        MI age 48s  . Lung cancer Brother 60  . Colon cancer Neg Hx   . Esophageal cancer Neg Hx   . Rectal cancer Neg Hx   . Stomach cancer Neg Hx     Review of Systems     Objective:  There were no vitals filed for this visit. BP Readings from Last 3 Encounters:  06/18/21 138/78  02/04/21 140/76  12/12/20 126/76   Wt Readings from Last 3 Encounters:  06/18/21 163 lb (73.9 kg)  02/04/21 161 lb (73 kg)  12/12/20 161 lb 6.4 oz (73.2 kg)   There is no height or weight on file to calculate BMI.   Physical Exam    Constitutional: Appears well-developed and well-nourished. No distress.  HENT:  Head: Normocephalic and atraumatic.  Neck: Neck  supple. No tracheal deviation present. No thyromegaly present.  No cervical lymphadenopathy Cardiovascular: Normal rate, regular rhythm and normal heart sounds.   No murmur heard. No carotid bruit .  No edema Pulmonary/Chest: Effort normal and breath sounds normal. No respiratory distress. No has no wheezes. No rales.  Skin: Skin is warm and dry. Not diaphoretic.  Psychiatric: Normal mood and affect. Behavior is normal.      Assessment & Plan:    See Problem List for Assessment and Plan of chronic medical problems.     This encounter was created in error - please disregard.

## 2021-06-16 ENCOUNTER — Encounter: Payer: HMO | Admitting: Internal Medicine

## 2021-06-17 NOTE — Progress Notes (Signed)
Subjective:    Patient ID: Valerie Mcclure, female    DOB: [birth year], 80 y.o.   MRN: [id number]  This visit occurred during the SARS-CoV-2 public health emergency.  Safety protocols were in place, including screening questions prior to the visit, additional usage of staff PPE, and extensive cleaning of exam room while observing appropriate contact time as indicated for disinfecting solutions.     HPI The patient is here for follow up of their chronic medical problems, including CAD, Afib, htn, hld, hypothyroidism, CKD, h/o CVA w/ dysphasia, R sided weakness, depression, prediabetes, chronic RUQ pain from thoracic neuropathy, insomnia, LE edema  She is exercising.  She is taking all of her medications as prescribed.    She has no concerns.  Her husband just died last week - he had just had surgery for lung cancer.    Medications and allergies reviewed with patient and updated if appropriate.  Patient Active Problem List   Diagnosis Date Noted   Eye tearing, left 02/19/2020   TMJ syndrome, left 02/19/2020   Lump in neck 06/08/2018   Sleep difficulties 03/31/2018   Persistent atrial fibrillation ([place]) 09/06/2017   TIA (transient ischemic attack) 07/12/2017   Bradycardia 04/12/2017   Depression 01/29/2017   Fall    Aphasia as late effect of cerebrovascular accident (CVA)    Chronic diastolic heart failure ([name])    Dysarthria, post-stroke    Atrial flutter ([place]) 12/22/2016   Spastic hemiplegia of right dominant side as late effect of cerebral infarction ([place])    Anemia of chronic disease    Hypertension    Dysphagia, post-stroke    Anterior cerebral circulation hemorrhagic infarction ([place]) 12/03/2016   Right hemiparesis ([place])    Nontraumatic subcortical hemorrhage of left cerebral hemisphere ([name])    Mild aortic regurgitation 08/20/2016   Bilateral edema of lower extremity 04/07/2016   Pancreatic duct dilated 01/09/2016   Stage 4 chronic kidney disease ([place])  12/25/2015   Mild tricuspid regurgitation 12/25/2015   Mild mitral regurgitation 12/25/2015   Prediabetes 06/10/2015   Abdominal pain, chronic, right upper quadrant 06/04/2015   Chronic low back pain    Pain in thoracic spine 04/18/2010   Coronary atherosclerosis 01/24/2009   ARTHRITIS, LEFT KNEE 12/13/2008   Hypothyroidism 11/21/2007   Hyperlipidemia 11/09/2007   Osteopenia 11/09/2007    Current Outpatient Medications on File Prior to Visit  Medication Sig Dispense Refill   acetaminophen (TYLENOL) 500 MG tablet Take 1,000 mg by mouth every 8 (eight) hours as needed.     atorvastatin (LIPITOR) 20 MG tablet TAKE 1 TABLET(20 MG) BY MOUTH DAILY 90 tablet 3   Calcium Carbonate-Vit D-Min (CALCIUM 1200 PO) Take 1 tablet by mouth daily.     Cholecalciferol (VITAMIN D3) 1000 UNITS CAPS Take 1,000 Units by mouth daily.     ciclopirox (LOPROX) 0.77 % cream Apply topically 2 (two) times daily. 90 g 2   ELIQUIS 2.5 MG TABS tablet TAKE 1 TABLET(2.5 MG) BY MOUTH TWICE DAILY 60 tablet 1   FLUoxetine (PROZAC) 40 MG capsule TAKE 1 CAPSULE(40 MG) BY MOUTH DAILY 90 capsule 0   hydrALAZINE (APRESOLINE) 25 MG tablet TAKE 1 TABLET(25 MG) BY MOUTH THREE TIMES DAILY 270 tablet 1   levothyroxine (SYNTHROID) 75 MCG tablet TAKE 1 TABLET(75 MCG) BY MOUTH DAILY BEFORE BREAKFAST. FOLLOW-UP 30 tablet 2   lisinopril (ZESTRIL) 2.5 MG tablet Take 2.5 mg by mouth daily.     metoprolol tartrate (LOPRESSOR) 25 MG tablet TAKE 1  TABLET(25 MG) BY MOUTH TWICE DAILY 180 tablet 1   mupirocin cream (BACTROBAN) 2 % Apply 1 application topically 2 (two) times daily. 15 g 0   nortriptyline (PAMELOR) 10 MG capsule TAKE 4 CAPSULES(40 MG) BY MOUTH AT BEDTIME 360 capsule 0   torsemide (DEMADEX) 20 MG tablet TAKE 1 TABLET(20 MG) BY MOUTH DAILY 30 tablet 5   vitamin B-12 (CYANOCOBALAMIN) 100 MCG tablet Take 100 mcg by mouth daily.     No current facility-administered medications on file prior to visit.    Past Medical History:   Diagnosis Date   Blood transfusion without reported diagnosis    Chronic low back pain    History of echocardiogram    Echo 5/18: EF 55-60, Gr 2 DD, mild MR, mild LAE, PASP 47 // Echo 1/18:  EF 55, mild AI, MAC, mild MR, mild LAE, trivial pericardial effusion   History of ischemic left MCA stroke 11/2016   left MCA infarct status post mechanical thrombectomy complicated by large left basal ganglia hemorrhage with right shift.   HTN (hypertension)    Hyperlipidemia    Hypothyroidism    Osteopenia    Persistent atrial fibrillation ([name])    CHADS2-VASc=6 (female, age 9, HTN, CVA) // Apixaban // rate control strategy     Past Surgical History:  Procedure Laterality Date   ABDOMINAL HYSTERECTOMY  1970   CHOLECYSTECTOMY  07/2009   Dr. Rise Patience   COLONOSCOPY  2003   [place]  2010   HAND SURGERY     IR ANGIO INTRA EXTRACRAN SEL COM CAROTID INNOMINATE UNI L MOD SED  11/29/2016   IR ANGIO VERTEBRAL SEL SUBCLAVIAN INNOMINATE UNI R MOD SED  11/29/2016   IR PERCUTANEOUS ART THROMBECTOMY/INFUSION INTRACRANIAL INC DIAG ANGIO  11/29/2016   IR RADIOLOGIST EVAL & MGMT  03/24/2017   LUMBAR LAMINECTOMY  11/2008   Done by Dr. Patrice Paradise   RADIOLOGY WITH ANESTHESIA N/A 11/29/2016   Procedure: RADIOLOGY WITH ANESTHESIA;  Surgeon: Radiologist, Medication, MD;  Location: [facility];  Service: Radiology;  Laterality: N/A;   THYROIDECTOMY      Social History   Socioeconomic History   Marital status: Married    Spouse name: Not on file   Number of children: 1   Years of education: Not on file   Highest education level: Not on file  Occupational History   Occupation: retired  Tobacco Use   Smoking status: Never   Smokeless tobacco: Never  Vaping Use   Vaping Use: Never used  Substance and Sexual Activity   Alcohol use: No   Drug use: No   Sexual activity: Not Currently  Other Topics Concern   Not on file  Social History Narrative   Married   Social Determinants of Health   Financial  Resource Strain: Low Risk    Difficulty of Paying Living Expenses: Not hard at all  Food Insecurity: No Food Insecurity   Worried About Charity fundraiser in the Last Year: Never true   Arboriculturist in the Last Year: Never true  Transportation Needs: Not on file  Physical Activity: Sufficiently Active   Days of Exercise per Week: 5 days   Minutes of Exercise per Session: 30 min  Stress: No Stress Concern Present   Feeling of Stress : Not at all  Social Connections: Socially Integrated   Frequency of Communication with Friends and Family: More than three times a week   Frequency of Social Gatherings with Friends and Family: Once a  week   Attends Religious Services: More than 4 times per year   Active Member of Clubs or Organizations: No   Attends Music therapist: More than 4 times per year   Marital Status: Married    Family History  Problem Relation Age of Onset   Heart disease Father 3       MI age 54s   Lung cancer Brother 55   Colon cancer Neg Hx    Esophageal cancer Neg Hx    Rectal cancer Neg Hx    Stomach cancer Neg Hx     Review of Systems  Constitutional:  Negative for appetite change, chills and fever.  Respiratory:  Negative for cough, shortness of breath and wheezing.   Cardiovascular:  Positive for leg swelling (controlled - stable). Negative for chest pain and palpitations.  Gastrointestinal:  Negative for abdominal pain, constipation, diarrhea and nausea.       No gerd  Neurological:  Negative for dizziness, light-headedness and headaches.  Psychiatric/Behavioral:  Negative for sleep disturbance.       Objective:   Vitals:   06/18/21 1458  BP: 138/78  Pulse: 79  Temp: 98 F (36.7 C)  SpO2: 99%   BP Readings from Last 3 Encounters:  06/18/21 138/78  02/04/21 140/76  12/12/20 126/76   Wt Readings from Last 3 Encounters:  06/18/21 163 lb (73.9 kg)  02/04/21 161 lb (73 kg)  12/12/20 161 lb 6.4 oz (73.2 kg)   Body mass index is  27.98 kg/m.   Physical Exam    Constitutional: Appears well-developed and well-nourished. No distress.  HENT:  Head: Normocephalic and atraumatic.  Neck: Neck supple. No tracheal deviation present. No thyromegaly present.  No cervical lymphadenopathy Cardiovascular: Normal rate, regular rhythm and normal heart sounds.   2/6 sys murmur heard. No carotid bruit .  Trace b/l LE edema Pulmonary/Chest: Effort normal and breath sounds normal. No respiratory distress. No has no wheezes. No rales.  Skin: Skin is warm and dry. Not diaphoretic.  Psychiatric: Normal mood and affect. Behavior is normal.      Assessment & Plan:    See Problem List for Assessment and Plan of chronic medical problems.   Follow-up in 6 months

## 2021-06-17 NOTE — Patient Instructions (Addendum)
    Blood work was ordered.      Medications changes include :   none     Please followup in 6 months  

## 2021-06-18 ENCOUNTER — Encounter: Payer: Self-pay | Admitting: Internal Medicine

## 2021-06-18 ENCOUNTER — Ambulatory Visit (INDEPENDENT_AMBULATORY_CARE_PROVIDER_SITE_OTHER): Payer: HMO | Admitting: Internal Medicine

## 2021-06-18 ENCOUNTER — Other Ambulatory Visit: Payer: Self-pay

## 2021-06-18 VITALS — BP 138/78 | HR 79 | Temp 98.0°F | Ht 64.0 in | Wt 163.0 lb

## 2021-06-18 DIAGNOSIS — E039 Hypothyroidism, unspecified: Secondary | ICD-10-CM

## 2021-06-18 DIAGNOSIS — G479 Sleep disorder, unspecified: Secondary | ICD-10-CM

## 2021-06-18 DIAGNOSIS — G8929 Other chronic pain: Secondary | ICD-10-CM

## 2021-06-18 DIAGNOSIS — I10 Essential (primary) hypertension: Secondary | ICD-10-CM | POA: Diagnosis not present

## 2021-06-18 DIAGNOSIS — I5032 Chronic diastolic (congestive) heart failure: Secondary | ICD-10-CM

## 2021-06-18 DIAGNOSIS — R600 Localized edema: Secondary | ICD-10-CM | POA: Diagnosis not present

## 2021-06-18 DIAGNOSIS — E7849 Other hyperlipidemia: Secondary | ICD-10-CM | POA: Diagnosis not present

## 2021-06-18 DIAGNOSIS — N184 Chronic kidney disease, stage 4 (severe): Secondary | ICD-10-CM

## 2021-06-18 DIAGNOSIS — R7303 Prediabetes: Secondary | ICD-10-CM

## 2021-06-18 DIAGNOSIS — R1011 Right upper quadrant pain: Secondary | ICD-10-CM | POA: Diagnosis not present

## 2021-06-18 DIAGNOSIS — I4819 Other persistent atrial fibrillation: Secondary | ICD-10-CM

## 2021-06-18 DIAGNOSIS — F3289 Other specified depressive episodes: Secondary | ICD-10-CM

## 2021-06-18 DIAGNOSIS — I2510 Atherosclerotic heart disease of native coronary artery without angina pectoris: Secondary | ICD-10-CM | POA: Diagnosis not present

## 2021-06-18 DIAGNOSIS — I69351 Hemiplegia and hemiparesis following cerebral infarction affecting right dominant side: Secondary | ICD-10-CM

## 2021-06-18 LAB — COMPREHENSIVE METABOLIC PANEL
ALT: 14 U/L (ref 0–35)
AST: 18 U/L (ref 0–37)
Albumin: 4.2 g/dL (ref 3.5–5.2)
Alkaline Phosphatase: 59 U/L (ref 39–117)
BUN: 27 mg/dL — ABNORMAL HIGH (ref 6–23)
CO2: 29 mEq/L (ref 19–32)
Calcium: 10.2 mg/dL (ref 8.4–10.5)
Chloride: 103 mEq/L (ref 96–112)
Creatinine, Ser: 1.69 mg/dL — ABNORMAL HIGH (ref 0.40–1.20)
GFR: 28.1 mL/min — ABNORMAL LOW (ref >60.00)
Glucose, Bld: 92 mg/dL (ref 70–99)
Potassium: 4.4 mEq/L (ref 3.5–5.1)
Sodium: 141 mEq/L (ref 135–145)
Total Bilirubin: 0.3 mg/dL (ref 0.2–1.2)
Total Protein: 7.4 g/dL (ref 6.0–8.3)

## 2021-06-18 LAB — LIPID PANEL
Cholesterol: 159 mg/dL (ref 0–200)
HDL: 55.2 mg/dL (ref >39.00)
NonHDL: 103.56
Total CHOL/HDL Ratio: 3
Triglycerides: 243 mg/dL — ABNORMAL HIGH (ref 0.0–149.0)
VLDL: 48.6 mg/dL — ABNORMAL HIGH (ref 0.0–40.0)

## 2021-06-18 LAB — CBC WITH DIFFERENTIAL/PLATELET
Basophils Absolute: 0 10*3/uL (ref 0.0–0.1)
Basophils Relative: 0.4 % (ref 0.0–3.0)
Eosinophils Absolute: 0.1 10*3/uL (ref 0.0–0.7)
Eosinophils Relative: 1.3 % (ref 0.0–5.0)
HCT: 37.7 % (ref 36.0–46.0)
Hemoglobin: 12.7 g/dL (ref 12.0–15.0)
Lymphocytes Relative: 22.1 % (ref 12.0–46.0)
Lymphs Abs: 2.1 10*3/uL (ref 0.7–4.0)
MCHC: 33.6 g/dL (ref 30.0–36.0)
MCV: 94.1 fl (ref 78.0–100.0)
Monocytes Absolute: 0.6 10*3/uL (ref 0.1–1.0)
Monocytes Relative: 6.8 % (ref 3.0–12.0)
Neutro Abs: 6.5 10*3/uL (ref 1.4–7.7)
Neutrophils Relative %: 69.4 % (ref 43.0–77.0)
Platelets: 307 10*3/uL (ref 150.0–400.0)
RBC: 4.01 Mil/uL (ref 3.87–5.11)
RDW: 13.6 % (ref 11.5–15.5)
WBC: 9.4 10*3/uL (ref 4.0–10.5)

## 2021-06-18 LAB — LDL CHOLESTEROL, DIRECT: Direct LDL: 64 mg/dL

## 2021-06-18 LAB — HEMOGLOBIN A1C: Hgb A1c MFr Bld: 6.1 % (ref 4.6–6.5)

## 2021-06-18 LAB — TSH: TSH: 1.58 u[IU]/mL (ref 0.35–5.50)

## 2021-06-18 NOTE — Assessment & Plan Note (Signed)
Chronic °Regular exercise and healthy diet encouraged °Check lipid panel  °Continue atorvastatin 20 mg daily °

## 2021-06-18 NOTE — Assessment & Plan Note (Signed)
Chronic Asymptomatic Continue metoprolol 25 mg twice daily and Eliquis 2.5 mg twice daily CMP, CBC

## 2021-06-18 NOTE — Assessment & Plan Note (Signed)
Chronic Controlled, Stable Continue nortriptyline 40 mg at bedtime

## 2021-06-18 NOTE — Assessment & Plan Note (Signed)
Chronic Euvolemic Following with cardiology Continue torsemide 20 mg daily

## 2021-06-18 NOTE — Assessment & Plan Note (Signed)
Chronic Check a1c Low sugar / carb diet Stressed regular exercise  

## 2021-06-18 NOTE — Assessment & Plan Note (Signed)
Chronic Blood pressure well controlled CMP Continue continue metoprolol 25 mg twice daily, lisinopril 2.5 mg daily, hydralazine 25 mg 3 times daily

## 2021-06-18 NOTE — Assessment & Plan Note (Signed)
Chronic Continue Eliquis 2.5 mg twice daily, atorvastatin 20 mg daily Blood pressure well controlled Continue regular exercise

## 2021-06-18 NOTE — Assessment & Plan Note (Signed)
Chronic Controlled Thoracic radiculopathy to right upper quadrant Continue nortriptyline 40 mg at bedtime

## 2021-06-18 NOTE — Assessment & Plan Note (Signed)
Chronic  Clinically euthyroid Currently taking levothyroxine 75 mcg daily Check tsh  Titrate med dose if needed  

## 2021-06-18 NOTE — Assessment & Plan Note (Signed)
Chronic No symptoms consistent with angina Continue current medications

## 2021-06-18 NOTE — Assessment & Plan Note (Signed)
Chronic CMP today Will reschedule nephrology appointment-was scheduled for last week, but her husband died Following with Dr. Hollie Salk

## 2021-06-18 NOTE — Assessment & Plan Note (Signed)
Chronic Controlled, Stable Her husband's diet and I do not think that she is comprehended that completely.  Discussed seeing a counselor at 1 point if needed and then if she needed any adjustments in her medication to let me know Continue fluoxetine 40 mg daily, nortriptyline 40 mg nightly

## 2021-06-18 NOTE — Assessment & Plan Note (Signed)
Chronic Controlled, Stable Continue torsemide 20 mg daily

## 2021-06-26 ENCOUNTER — Ambulatory Visit: Payer: HMO

## 2021-07-03 ENCOUNTER — Telehealth: Payer: Self-pay | Admitting: Internal Medicine

## 2021-07-03 NOTE — Telephone Encounter (Signed)
Type of form received (Home Health, FMLA, disability, handicapped placard, Surgical clearance) Social Security   Form placed in (E-fax folder, Retail banker)  Retail banker   Additional instructions from the patient (mail, fax, notify by phone when complete) Notify by phone  Things to remember: [place] office: If form received in person, remind patient that forms take 7-10 business days CMA should attach charge sheet and put on [employer]

## 2021-07-04 NOTE — Telephone Encounter (Signed)
Reviewed paperwork today and we are unable to complete them.  She will come back by the office to pick them up.

## 2021-07-04 NOTE — Telephone Encounter (Signed)
Patient's son [name] requesting a call back to discuss why paperwork can not be completed  Phone [phone]

## 2021-07-05 NOTE — Telephone Encounter (Signed)
Letter printed.

## 2021-07-07 NOTE — Telephone Encounter (Signed)
Spoke with son today.  He will have his daughter come by and pick up envelop.  Letter placed in sleeve with paperwork.

## 2021-07-08 DIAGNOSIS — R7303 Prediabetes: Secondary | ICD-10-CM | POA: Diagnosis not present

## 2021-07-08 DIAGNOSIS — Z8673 Personal history of transient ischemic attack (TIA), and cerebral infarction without residual deficits: Secondary | ICD-10-CM | POA: Diagnosis not present

## 2021-07-08 DIAGNOSIS — N2581 Secondary hyperparathyroidism of renal origin: Secondary | ICD-10-CM | POA: Diagnosis not present

## 2021-07-08 DIAGNOSIS — I4891 Unspecified atrial fibrillation: Secondary | ICD-10-CM | POA: Diagnosis not present

## 2021-07-08 DIAGNOSIS — N184 Chronic kidney disease, stage 4 (severe): Secondary | ICD-10-CM | POA: Diagnosis not present

## 2021-07-08 DIAGNOSIS — I129 Hypertensive chronic kidney disease with stage 1 through stage 4 chronic kidney disease, or unspecified chronic kidney disease: Secondary | ICD-10-CM | POA: Diagnosis not present

## 2021-07-08 DIAGNOSIS — E785 Hyperlipidemia, unspecified: Secondary | ICD-10-CM | POA: Diagnosis not present

## 2021-07-09 ENCOUNTER — Telehealth: Payer: Self-pay | Admitting: Internal Medicine

## 2021-07-09 ENCOUNTER — Telehealth: Payer: HMO

## 2021-07-09 ENCOUNTER — Other Ambulatory Visit: Payer: Self-pay | Admitting: Internal Medicine

## 2021-07-09 MED ORDER — CLONAZEPAM 0.5 MG PO TABS: 0.2500 mg | ORAL_TABLET | Freq: Two times a day (BID) | ORAL | 0 refills | Status: DC | PRN

## 2021-07-09 NOTE — Telephone Encounter (Signed)
I am sorry for their loss.  The scratching if it started just after his death almost sounds psychological - if it started prior they could try a daily Claritin for the itch. For the anxiety/stress we could try a low dose of twice daily clonazepam but this can potentially cause a little drowsiness.  Let me know     ----- If no improvement she needs to be evaluated in person.

## 2021-07-09 NOTE — Telephone Encounter (Signed)
Patient's daughter [name] states patient's husband passed last month  Caller stated since the passing of patient's husband, patient has not had any rest and is constantly scratching and has now developed scars from the scratching  Patient daughter is requesting a call back to discuss medication options to help patient rest and stop the constant scratching

## 2021-07-24 NOTE — Telephone Encounter (Signed)
Caller inquiring status of return call   Informed caller per nurse a letter was picked up on 07-05-2021  Caller states she knows who picked up the letter and inquiring how the person was allowed to pick up the letter and they are not listed on the dpr  Informed caller I was not allowed to release any additional information, encouraged caller to come by the office and fill out dpr w/ patient's signature in order for future patient related information to be released to her

## 2021-07-24 NOTE — Telephone Encounter (Signed)
Caller states she is patient's daughter Valerie Mcclure  Caller states she accompanied patient to last ov and discuss patient's mental status due to stroke w/ provider  Caller states she emailed provider requesting a letter for Social Security stating patient is unable to handle her financial responsibilities due to stroke  Caller is not listed on patient's dpr, no patient information was released  Caller requesting a call back [phone]

## 2021-07-25 NOTE — Telephone Encounter (Signed)
Message left for Suanne Marker to return to clinic.   If she calls back today after 1:00 pm let her know I will be back in the office on Monday and will call her.

## 2021-07-30 ENCOUNTER — Other Ambulatory Visit: Payer: Self-pay | Admitting: Internal Medicine

## 2021-07-30 ENCOUNTER — Encounter: Payer: Self-pay | Admitting: Internal Medicine

## 2021-07-30 NOTE — Telephone Encounter (Signed)
Spoke with daughter today.  She will pick up letter tomorrow.

## 2021-07-31 NOTE — Telephone Encounter (Signed)
Spoke with daughter on yesterday.  Valerie Mcclure went in to details about a family manner involving her mom, stepfather and stepfather's son. Once stepfather and son share the same name and stepfather was listed on the DPR while he was living. He has now passed but still listed on the [facility]. When the stepson called in he used the name [name] but he goes by [employer].  [name] has forged the patient's signature in attempt to collect insurance money from his father's policy. Valerie Mcclure has sense blocked him from being able to do so and will be moving her mom in with her to take care of her.   She will have patient update DPR to have only her on the DPR and the only one to have access to her medical records.   She needed the letter to take to [facility] to prove that mom had stroke and since fraud alert had been put on her account due to illegal activity from [place].   She will follow up with [place] and let us know if she needs anything else.  No information was discussed in detail with patient's daughter and she understands she will need to be added to [facility] to have full disclosure.

## 2021-08-18 ENCOUNTER — Ambulatory Visit (INDEPENDENT_AMBULATORY_CARE_PROVIDER_SITE_OTHER): Payer: HMO

## 2021-08-18 DIAGNOSIS — I5032 Chronic diastolic (congestive) heart failure: Secondary | ICD-10-CM

## 2021-08-18 DIAGNOSIS — I10 Essential (primary) hypertension: Secondary | ICD-10-CM

## 2021-08-18 DIAGNOSIS — E039 Hypothyroidism, unspecified: Secondary | ICD-10-CM

## 2021-08-18 DIAGNOSIS — I4819 Other persistent atrial fibrillation: Secondary | ICD-10-CM

## 2021-08-18 DIAGNOSIS — I2510 Atherosclerotic heart disease of native coronary artery without angina pectoris: Secondary | ICD-10-CM

## 2021-08-18 NOTE — Patient Instructions (Signed)
Visit Information  Following are the goals we discussed today:   Manage My Medicine   Timeframe:  Long-Range Goal Priority:  High Start Date:    11/13/20                         Expected End Date:  08/18/2022                   Follow Up Date 02/15/22   - call for medicine refill 2 or 3 days before it runs out - call if I am sick and can't take my medicine - keep a list of all the medicines I take; vitamins and herbals too  -Weigh daily; if you gain more than 3 pounds in one day or 5 pounds in one week, take extra 1/2 dose of torsemide. Call PCP if you take more than 2 extra doses in a week. -Get Shingrix vaccine at local pharmacy -Start Calcium-Vitamin D supplement for bone health    Why is this important?   These steps will help you keep on track with your medicines.  Plan: Telephone follow up appointment with care management team member scheduled for:  6 months  The patient has been provided with contact information for the care management team and has been advised to call with any health related questions or concerns.   Tomasa Blase, PharmD Clinical Pharmacist, [facility]   Please call the care guide team at [phone] if you need to cancel or reschedule your appointment.   The patient verbalized understanding of instructions, educational materials, and care plan provided today and agreed to receive a mailed copy of patient instructions, educational materials, and care plan.

## 2021-08-18 NOTE — Progress Notes (Signed)
Chronic Care Management Pharmacy Note  08/18/2021 Name:  Valerie Mcclure MRN:  [id number] DOB:  07/19/1941  Summary: -Spoke with patient's daughter Valerie Mcclure (on [place]) - reports that patient is doing well, reports compliance with current medications, denies any issues or concerns -Not currently checking BP or HR at home, checking at office visits - has been controlled with most recent visits -Notes that clonazepam has been helpful to cope with loss of husband - requests a refill if PCP is agreeable -Has not started calcium / vit D supplementation as dicussed with last CCM visit    Recommendations/Changes made from today's visit: -Recommended for patient to start calcium / vitamin D supplementation -Patient / daughter to reach out with any issues or concerns regarding her medications  -F/u in 6 months   Subjective: Valerie Mcclure is an 81 y.o. year old female who is a primary patient of Burns, Claudina Lick, MD.  The CCM team was consulted for assistance with disease management and care coordination needs.    Engaged with patient by telephone for follow up visit in response to provider referral for pharmacy case management and/or care coordination services.   Consent to Services:  The patient was given information about Chronic Care Management services, agreed to services, and gave verbal consent prior to initiation of services.  Please see initial visit note for detailed documentation.   Patient Care Team: [name], MD as PCP - General (Internal Medicine) Nahser, Wonda Cheng, MD as PCP - Cardiology (Cardiology) Starling Manns, MD as Consulting Physician (Orthopedic Surgery) Nicholaus Bloom, MD as Consulting Physician (Anesthesiology) Gatha Mayer, MD as Consulting Physician (Gastroenterology) Tomasa Blase, [facility] (Pharmacist)  Recent office visits: 06/18/2021 - Dr. Quay Burow - no changes to medications - follow up in 6 months  02/04/2021 - Dr. Mitchel Honour - evaluation of rash on arms  and legs / dog bite - prescribed prednisone and mupirocin ointment   Recent consult visits: None since last visit   Hospital visits: None in previous 6 months  Objective:  Lab Results  Component Value Date   CREATININE 1.69 (H) 06/18/2021   BUN 27 (H) 06/18/2021   GFR 28.10 (L) 06/18/2021   GFRNONAA 36 (L) 07/13/2017   GFRAA 42 (L) 07/13/2017   NA 141 06/18/2021   K 4.4 06/18/2021   CALCIUM 10.2 06/18/2021   CO2 29 06/18/2021   GLUCOSE 92 06/18/2021    Lab Results  Component Value Date/Time   HGBA1C 6.1 06/18/2021 03:37 PM   HGBA1C 5.9 06/10/2020 01:31 PM   GFR 28.10 (L) 06/18/2021 03:37 PM   GFR 22.91 (L) 11/13/2020 02:52 PM    Last diabetic Eye exam: No results found for: HMDIABEYEEXA  Last diabetic Foot exam: No results found for: HMDIABFOOTEX   Lab Results  Component Value Date   CHOL 159 06/18/2021   HDL 55.20 06/18/2021   LDLCALC 51 06/10/2020   LDLDIRECT 64.0 06/18/2021   TRIG 243.0 (H) 06/18/2021   CHOLHDL 3 06/18/2021    Hepatic Function Latest Ref Rng & Units 06/18/2021 06/10/2020 12/11/2019  Total Protein 6.0 - 8.3 g/dL 7.4 7.7 7.3  Albumin 3.5 - 5.2 g/dL 4.2 4.4 4.4  AST 0 - 37 U/L _0 ALT 0 - 35 U/L _1 Alk Phosphatase 39 - 117 U/L 59 68 59  Total Bilirubin 0.2 - 1.2 mg/dL 0.3 0.4 0.4  Bilirubin, Direct 0.0 - 0.3 mg/dL - - -    Lab Results  Component  Value Date/Time   TSH 1.58 06/18/2021 03:37 PM   TSH 0.70 06/10/2020 01:31 PM   FREET4 1.36 (H) 11/30/2016 01:46 PM    CBC Latest Ref Rng & Units 06/18/2021 06/10/2020 12/11/2019  WBC 4.0 - 10.5 K/uL 9.4 7.9 8.7  Hemoglobin 12.0 - 15.0 g/dL 12.7 13.8 12.6  Hematocrit 36.0 - 46.0 % 37.7 41.6 37.1  Platelets 150.0 - 400.0 K/uL 307.0 326.0 329.0    Lab Results  Component Value Date/Time   VD25OH 29 (L) 12/13/2008 09:58 PM    Clinical ASCVD: Yes  The ASCVD Risk score (Arnett DK, et al., 2019) failed to calculate for the following reasons:   The 2019 ASCVD risk score is only  valid for ages 83 to 73    Depression screen PHQ 2/9 06/25/2020 02/22/2020 09/06/2019  Decreased Interest 0 0 0  Down, Depressed, Hopeless 0 0 0  PHQ - 2 Score 0 0 0  Altered sleeping - - -  Tired, decreased energy - - -  Change in appetite - - -  Feeling bad or failure about yourself  - - -  Trouble concentrating - - -  Moving slowly or fidgety/restless - - -  Suicidal thoughts - - -  PHQ-9 Score - - -  Difficult doing work/chores - - -  Some recent data might be hidden     CHA2DS2-VASc Score =    The patient's score is based upon:        Social History   Tobacco Use  Smoking Status Never  Smokeless Tobacco Never   BP Readings from Last 3 Encounters:  06/18/21 138/78  02/04/21 140/76  12/12/20 126/76   Pulse Readings from Last 3 Encounters:  06/18/21 79  02/04/21 74  12/12/20 89   Wt Readings from Last 3 Encounters:  06/18/21 163 lb (73.9 kg)  02/04/21 161 lb (73 kg)  12/12/20 161 lb 6.4 oz (73.2 kg)   BMI Readings from Last 3 Encounters:  06/18/21 27.98 kg/m  02/04/21 27.64 kg/m  12/12/20 27.70 kg/m    Assessment/Interventions: Review of patient past medical history, allergies, medications, health status, including review of consultants reports, laboratory and other test data, was performed as part of comprehensive evaluation and provision of chronic care management services.   SDOH:  (Social Determinants of Health) assessments and interventions performed: Yes  SDOH Screenings   Alcohol Screen: Not on file  Depression (PHQ2-9): Not on file  Financial Resource Strain: Not on file  Food Insecurity: Not on file  Housing: Not on file  Physical Activity: Not on file  Social Connections: Not on file  Stress: Not on file  Tobacco Use: Low Risk    Smoking Tobacco Use: Never   Smokeless Tobacco Use: Never   Passive Exposure: Not on file  Transportation Needs: Not on file    CCM Care Plan  Allergies  Allergen Reactions   Shrimp [Shellfish Allergy]  Anaphylaxis and Rash    Break outs and swelling of the throat   Valsartan Other (See Comments)    Renal failure   Tandem Plus [Fefum-Fepo-Fa-B Cmp-C-Zn-Mn-Cu] Nausea And Vomiting   Ivp Dye [Iodinated Contrast Media] Rash    itching   Penicillins Itching and Rash    Has patient had a PCN reaction causing immediate rash, facial/tongue/throat swelling, SOB or lightheadedness with hypotension: Yes Has patient had a PCN reaction causing severe rash involving mucus membranes or skin necrosis: No Has patient had a PCN reaction that required hospitalization: No Has patient had a  PCN reaction occurring within the last 10 years: No If all of the above answers are "NO", then may proceed with Cephalosporin use.     Medications Reviewed Today     Reviewed by Tomasa Blase, [facility] (Pharmacist) on 08/18/21 at 1338  Med List Status: <None>   Medication Order Taking? Sig Documenting Provider Last Dose Status Informant  acetaminophen (TYLENOL) 500 MG tablet [id number] Yes Take 1,000 mg by mouth every 8 (eight) hours as needed. [provider] Taking Active   atorvastatin (LIPITOR) 20 MG tablet [id number] Yes TAKE 1 TABLET(20 MG) BY MOUTH DAILY Quay Burow, Claudina Lick, MD Taking Active   CALCIUM-VITAMIN D PO [id number] Yes Take by mouth. [provider] Taking Active   ciclopirox (LOPROX) 0.77 % cream [id number] Yes Apply topically 2 (two) times daily. [name], MD Taking Active   clonazePAM Bobbye [place]) 0.5 MG tablet [id number] Yes Take 0.5 tablets (0.25 mg total) by mouth 2 (two) times daily as needed for anxiety. [name], MD Taking Active   ELIQUIS 2.5 MG TABS tablet [id number] Yes TAKE 1 TABLET(2.5 MG) BY MOUTH TWICE DAILY Burns, Claudina Lick, MD Taking Active   FLUoxetine (PROZAC) 40 MG capsule [id number] Yes TAKE 1 CAPSULE(40 MG) BY MOUTH DAILY Burns, Claudina Lick, MD Taking Active   hydrALAZINE (APRESOLINE) 25 MG tablet [id number] Yes TAKE 1 TABLET(25 MG) BY MOUTH THREE TIMES DAILY Burns, Claudina Lick,  MD Taking Active   levothyroxine (SYNTHROID) 75 MCG tablet [id number] Yes TAKE 1 TABLET(75 MCG) BY MOUTH DAILY BEFORE BREAKFAST. FOLLOW-UP [name], MD Taking Active   lisinopril (ZESTRIL) 2.5 MG tablet [id number] Yes Take 2.5 mg by mouth daily. [provider] Taking Active   metoprolol tartrate (LOPRESSOR) 25 MG tablet [id number] Yes TAKE 1 TABLET(25 MG) BY MOUTH TWICE DAILY Burns, Claudina Lick, MD Taking Active   nortriptyline (PAMELOR) 10 MG capsule [id number] Yes TAKE 4 CAPSULES(40 MG) BY MOUTH AT BEDTIME [name], MD Taking Active   torsemide (DEMADEX) 20 MG tablet [id number] Yes TAKE 1 TABLET(20 MG) BY MOUTH DAILY Burns, Claudina Lick, MD Taking Active   vitamin B-12 (CYANOCOBALAMIN) 100 MCG tablet [id number] Yes Take 100 mcg by mouth daily. [provider] Taking Active Self            Patient Active Problem List   Diagnosis Date Noted   Eye tearing, left 02/19/2020   TMJ syndrome, left 02/19/2020   Lump in neck 06/08/2018   Sleep difficulties 03/31/2018   Persistent atrial fibrillation ([place]) 09/06/2017   TIA (transient ischemic attack) 07/12/2017   Bradycardia 04/12/2017   Depression 01/29/2017   Fall    Aphasia as late effect of cerebrovascular accident (CVA)    Chronic diastolic heart failure ([name])    Dysarthria, post-stroke    Atrial flutter ([place]) 12/22/2016   Spastic hemiplegia of right dominant side as late effect of cerebral infarction ([place])    Anemia of chronic disease    Hypertension    Dysphagia, post-stroke    Anterior cerebral circulation hemorrhagic infarction ([place]) 12/03/2016   Right hemiparesis ([place])    Nontraumatic subcortical hemorrhage of left cerebral hemisphere ([name])    Mild aortic regurgitation 08/20/2016   Bilateral edema of lower extremity 04/07/2016   Pancreatic duct dilated 01/09/2016   Stage 4 chronic kidney disease ([place]) 12/25/2015   Mild tricuspid regurgitation 12/25/2015   Mild mitral regurgitation 12/25/2015   Prediabetes  06/10/2015   Abdominal pain, chronic, right upper quadrant 06/04/2015   Chronic low back pain  Pain in thoracic spine 04/18/2010   Coronary atherosclerosis 01/24/2009   ARTHRITIS, LEFT KNEE 12/13/2008   Hypothyroidism 11/21/2007   Hyperlipidemia 11/09/2007   Osteopenia 11/09/2007    Immunization History  Administered Date(s) Administered   Fluad Quad(high Dose 65+) 04/15/2019, 06/10/2020   Influenza Split 04/22/2011, 05/11/2013   Influenza, High Dose Seasonal PF 06/04/2015, 04/07/2016, 04/12/2017, 03/31/2018   Influenza,inj,Quad PF,6+ Mos 04/03/2014   Influenza-Unspecified 04/30/2021   Moderna Sars-Covid-2 Vaccination 09/19/2019, 10/19/2019, 07/02/2020   Pneumococcal Conjugate-13 12/25/2015   Pneumococcal Polysaccharide-23 11/18/2010   Td 07/20/2005    Conditions to be addressed/monitored:  Hypertension, Hyperlipidemia, Atrial Fibrillation, Heart Failure, Chronic Kidney Disease, Hypothyroidism, Depression and Osteopenia  Patient Care Plan: CCM Pharmacy Care Plan     Problem Identified: Hypertension, Hyperlipidemia, Atrial Fibrillation, Heart Failure, Chronic Kidney Disease, Hypothyroidism, Depression and Osteopenia   Priority: High     Long-Range Goal: Disease management   Start Date: 11/13/2020  Expected End Date: 08/18/2022  This Visit's Progress: On track  Recent Progress: On track  Priority: High  Note:   Current Barriers:  Unable to independently monitor therapeutic efficacy  Pharmacist Clinical Goal(s):  Patient will achieve adherence to monitoring guidelines and medication adherence to achieve therapeutic efficacy  Interventions: 1:1 collaboration with [name], MD regarding development and update of comprehensive plan of care as evidenced by provider attestation and co-signature Inter-disciplinary care team collaboration (see longitudinal plan of care) Comprehensive medication review performed; medication list updated in electronic medical  record  Hyperlipidemia / hx TIA: (LDL goal < 70) -Controlled - LDL is at goal; pt denies side effects Lab Results  Component Value Date   LDLCALC 51 06/10/2020  -Current treatment: Atorvastatin 20 mg daily -Educated on Cholesterol goals; Benefits of statin for ASCVD risk reduction; -Recommended to continue current medication  Atrial Fibrillation (Goal: prevent stroke and major bleeding) -Controlled -no recent issues with palpitations, denies any abnormal bruising or bleeding  -CHADSVASC: 6 -Current treatment: Rate control: Metoprolol tartrate 25 mg BID Anticoagulation: Eliquis 2.5 mg BID (dosing appropriate based on Scr >1.5 and Age >80) -Home BP and HR readings: not currently checking BP/ HR at home  -Counseled on increased risk of stroke due to Afib and benefits of anticoagulation for stroke prevention; importance of adherence to anticoagulant exactly as prescribed; bleeding risk associated with Eliquis and importance of self-monitoring for signs/symptoms of bleeding; -Recommended to continue current medication  Heart Failure / HTN / CKD (Goal: BP < 130/80, prevent exacerbations) -Controlled-  -Last ejection fraction: 55-60% (Date: 11/2016) -HF type: Diastolic BP Readings from Last 3 Encounters:  06/18/21 138/78  02/04/21 140/76  12/12/20 126/76   Pulse Readings from Last 3 Encounters:  06/18/21 79  02/04/21 74  12/12/20 89  -Current treatment: Torsemide 20 mg daily Hydralazine 25 mg TID Lisinopril 2.5 mg daily Metoprolol tartrate 25 mg BID -Educated on Benefits of medications for managing symptoms and prolonging life; Importance of weighing daily; if you gain more than 3 pounds in one day or 5 pounds in one week, take extra 1/2 dose of torsemide; Proper diuretic administration and potassium supplementation  -Recommend to continue current medication  Depression/Anxiety (Goal: manage symptoms) -Controlled - pt reports mood is well controlled; she is sleeping well with  nortriptyline at bedtime - continues to grieve from loss of husband - clonazepam is helping (using sparingly) -Current treatment: Fluoxetine 40 mg daily Nortriptyline 10 mg - 4 caps HS Clonazepam 0.54m - 1 tablet BID prn  -PHQ9: 0 (06/2020) -GAD7: not on file -  Connected with PCP for mental health support -Educated on Benefits of medication for symptom control -Recommended to continue current medication  Osteopenia (Goal: prevent fractures) -Not ideally controlled - pt is not taking ca/vitamin D -Last DEXA Scan: 2015  T-Score femoral neck: +0.5  T-Score forearm radius: +1.2 -Patient is not a candidate for pharmacologic treatment -Recommend [phone] units of vitamin D daily. Recommend 1200 mg of calcium daily from dietary and supplemental sources - daughter Valerie Mcclure voiced understanding of plan   Hypothyroidism (Goal: maintain TSH in goal range) -Controlled - TSH is within goal range; pt takes levothyroxine with other meds in the morning Lab Results  Component Value Date   TSH 1.58 06/18/2021  -Current treatment  Levothyroxine 75 mcg daily -Recommended to continue current medication  Health Maintenance -Vaccine gaps: Shingrix -Current therapy:  Vitamin B12 100 mcg daily Tylenol 500 mg QID -Patient is satisfied with current therapy and denies issues -Recommended Tylenol up to 3000 mg/day, lidocaine patch or Voltaren gel as needed  -Recommended Shingrix, Tetanus, and COVID booster vaccine at pharmacy  Patient Goals/Self-Care Activities Patient will:  - take medications as prescribed -focus on medication adherence by routine -check blood pressure daily, document, and provide at future appointments  -Weigh daily; if you gain more than 3 pounds in one day or 5 pounds in one week, take extra 1/2 dose of torsemide. Call PCP if you take more than 2 extra doses in a week. -Get Shingrix, Tdap, and COVID booster vaccine at local pharmacy -Start Calcium-Vitamin D supplement for bone  health      Care Gaps: Shingrix Covid booster Tdap    Patient's preferred pharmacy is:  [facility] [address], [place] [address] Phone: [phone] Fax: [phone]   Uses pill box? No - prefes bottles Pt endorses 100% compliance  Care Plan and Follow Up Patient Decision:  Patient agrees to Care Plan and Follow-up.  Plan: Telephone follow up appointment with care management team member scheduled for:  6 months  Tomasa Blase, PharmD Clinical Pharmacist, [place]

## 2021-08-19 ENCOUNTER — Other Ambulatory Visit: Payer: Self-pay | Admitting: Internal Medicine

## 2021-08-19 DIAGNOSIS — I2510 Atherosclerotic heart disease of native coronary artery without angina pectoris: Secondary | ICD-10-CM

## 2021-08-19 DIAGNOSIS — E039 Hypothyroidism, unspecified: Secondary | ICD-10-CM

## 2021-08-19 DIAGNOSIS — I10 Essential (primary) hypertension: Secondary | ICD-10-CM

## 2021-08-19 DIAGNOSIS — I5032 Chronic diastolic (congestive) heart failure: Secondary | ICD-10-CM | POA: Diagnosis not present

## 2021-08-19 DIAGNOSIS — I4819 Other persistent atrial fibrillation: Secondary | ICD-10-CM | POA: Diagnosis not present

## 2021-08-19 MED ORDER — CLONAZEPAM 0.5 MG PO TABS: 0.2500 mg | ORAL_TABLET | Freq: Two times a day (BID) | ORAL | 0 refills | Status: DC | PRN

## 2021-08-27 ENCOUNTER — Telehealth: Payer: Self-pay

## 2021-08-27 NOTE — Telephone Encounter (Signed)
Pt daughter [name]) calling to remove someone off of pt DPR. I advised Pt that the pt as long as she is fully competent that she could complete another DPR form for [place].  FYI

## 2021-09-02 ENCOUNTER — Ambulatory Visit: Payer: HMO

## 2021-09-02 ENCOUNTER — Other Ambulatory Visit: Payer: Self-pay

## 2021-09-08 ENCOUNTER — Other Ambulatory Visit: Payer: Self-pay | Admitting: Internal Medicine

## 2021-09-08 ENCOUNTER — Telehealth: Payer: Self-pay

## 2021-09-08 NOTE — Progress Notes (Signed)
Chronic Care Management Pharmacy Assistant   Name: Valerie Mcclure  MRN: [id number] DOB: 09/28/1940  Valerie Mcclure is an 81 y.o. year old female who presents for his follow-up CCM visit with the clinical pharmacist.  Reason for Encounter: Disease State   Conditions to be addressed/monitored: HTN  Recent office visits:  None ID  Recent consult visits:  None ID  Hospital visits:  None in previous 6 months  Medications: Outpatient Encounter Medications as of 09/08/2021  Medication Sig   acetaminophen (TYLENOL) 500 MG tablet Take 1,000 mg by mouth every 8 (eight) hours as needed.   atorvastatin (LIPITOR) 20 MG tablet TAKE 1 TABLET(20 MG) BY MOUTH DAILY   CALCIUM-VITAMIN D PO Take by mouth.   ciclopirox (LOPROX) 0.77 % cream Apply topically 2 (two) times daily.   clonazePAM (KLONOPIN) 0.5 MG tablet Take 0.5 tablets (0.25 mg total) by mouth 2 (two) times daily as needed for anxiety.   ELIQUIS 2.5 MG TABS tablet TAKE 1 TABLET(2.5 MG) BY MOUTH TWICE DAILY   FLUoxetine (PROZAC) 40 MG capsule TAKE 1 CAPSULE(40 MG) BY MOUTH DAILY   hydrALAZINE (APRESOLINE) 25 MG tablet TAKE 1 TABLET(25 MG) BY MOUTH THREE TIMES DAILY   levothyroxine (SYNTHROID) 75 MCG tablet TAKE 1 TABLET(75 MCG) BY MOUTH DAILY BEFORE BREAKFAST. FOLLOW-UP   lisinopril (ZESTRIL) 2.5 MG tablet Take 2.5 mg by mouth daily.   metoprolol tartrate (LOPRESSOR) 25 MG tablet TAKE 1 TABLET(25 MG) BY MOUTH TWICE DAILY   nortriptyline (PAMELOR) 10 MG capsule TAKE 4 CAPSULES(40 MG) BY MOUTH AT BEDTIME   torsemide (DEMADEX) 20 MG tablet TAKE 1 TABLET(20 MG) BY MOUTH DAILY   vitamin B-12 (CYANOCOBALAMIN) 100 MCG tablet Take 100 mcg by mouth daily.   No facility-administered encounter medications on file as of 09/08/2021.   Reviewed chart prior to disease state call. Spoke with patient regarding BP  Recent Office Vitals: BP Readings from Last 3 Encounters:  06/18/21 138/78  02/04/21 140/76  12/12/20 126/76   Pulse  Readings from Last 3 Encounters:  06/18/21 79  02/04/21 74  12/12/20 89    Wt Readings from Last 3 Encounters:  06/18/21 163 lb (73.9 kg)  02/04/21 161 lb (73 kg)  12/12/20 161 lb 6.4 oz (73.2 kg)     Kidney Function Lab Results  Component Value Date/Time   CREATININE 1.69 (H) 06/18/2021 03:37 PM   CREATININE 2.01 (H) 11/13/2020 02:52 PM   GFR 28.10 (L) 06/18/2021 03:37 PM   GFRNONAA 36 (L) 07/13/2017 05:31 AM   GFRAA 42 (L) 07/13/2017 05:31 AM    BMP Latest Ref Rng & Units 06/18/2021 11/13/2020 06/10/2020  Glucose 70 - 99 mg/dL 92 106(H) 105(H)  BUN 6 - 23 mg/dL 27(H) 35(H) 26(H)  Creatinine 0.40 - 1.20 mg/dL 1.69(H) 2.01(H) 1.68(H)  BUN/Creat Ratio 12 - 28 - - -  Sodium 135 - 145 mEq/L 141 140 140  Potassium 3.5 - 5.1 mEq/L 4.4 3.8 4.7  Chloride 96 - 112 mEq/L 103 99 102  CO2 19 - 32 mEq/L 29 29 28   Calcium 8.4 - 10.5 mg/dL 10.2 9.8 9.9    Current antihypertensive regimen:  Torsemide 20 mg daily Hydralazine 25 mg TID Lisinopril 2.5 mg daily Metoprolol tartrate 25 mg BID  How often are you checking your Blood Pressure?  Spoke with patient daughter [name] who stated that she has not checked patient blood pressure in a few weeks  Current home BP readings: She states that she believes patient blood pressure is  normal range. Patient has not been complaining of dizziness or being light headed  What recent interventions/DTPs have been made by any provider to improve Blood Pressure control since last CPP Visit: None noted  Any recent hospitalizations or ED visits since last visit with CPP? No  What diet changes have been made to improve Blood Pressure Control?  Daughter states that patient has not made any changes in diet  What exercise is being done to improve your Blood Pressure Control?  Daughter states that patient has real bad back pain in her back so she have trouble with walking  Adherence Review: Is the patient currently on ACE/ARB medication? Yes Does the  patient have >5 day gap between last estimated fill dates? Yes   Care Gaps: Colonoscopy-NA Diabetic Foot Exam-NA Mammogram-NA Ophthalmology-NA Dexa Scan - NA Annual Well Visit - NA Micro albumin-NA Hemoglobin A1c- 06/18/21  Star Rating Drugs: Atorvastatin 20 mg-last fill 06/11/21 90 ds Lisinopril 2.5 mg-last fill 04/13/21 90 ds  Ethelene Hal Clinical Pharmacist Assistant [phone]

## 2021-09-11 ENCOUNTER — Telehealth: Payer: Self-pay | Admitting: Internal Medicine

## 2021-09-11 NOTE — Telephone Encounter (Signed)
Pts daughter states pt has periodic back pain, pts daughter states pt is having severe back pain and requesting a rx for the pain  Encouraged daughter to schedule an ov, she declined due to lack of transportation   Daughter requesting a c/b

## 2021-09-11 NOTE — Telephone Encounter (Signed)
Book patient for 09/17/21 for slot at 340

## 2021-09-11 NOTE — Telephone Encounter (Signed)
Needs an appt

## 2021-09-14 ENCOUNTER — Other Ambulatory Visit: Payer: Self-pay | Admitting: Internal Medicine

## 2021-09-15 NOTE — Telephone Encounter (Signed)
Used 240 slot

## 2021-09-17 ENCOUNTER — Ambulatory Visit: Payer: HMO | Admitting: Internal Medicine

## 2021-10-02 ENCOUNTER — Telehealth: Payer: Self-pay | Admitting: Internal Medicine

## 2021-10-02 NOTE — Telephone Encounter (Signed)
Appointment made

## 2021-10-02 NOTE — Telephone Encounter (Signed)
Patient daughter [name] calling in ? ?Says patient other daughter received letter in the mail stating that patient was no longer competent enough to take care of her own finances ? ?[name] says that is completely not true & that she spoke to a rep at [facility] they advised her that she needed to get letter from patient provider proving that she is competent enough to handle her finances ? ?Please fu [phone] ?

## 2021-10-02 NOTE — Telephone Encounter (Signed)
The letter written in December does not state she is incompetent.  It stated she had a stroke and has some difficulty speaking because of it. ? ? ?It may be best if no letters will be written in the future w/o a visit given family dynamics.  ?

## 2021-10-06 ENCOUNTER — Encounter: Payer: Self-pay | Admitting: Internal Medicine

## 2021-10-06 NOTE — Patient Instructions (Addendum)
? ? ? ?  Blood work was ordered.   ? ? ?Medications changes include :    ? ? ?Your prescription(s) have been sent to your pharmacy.  ? ? ?A referral was ordered for XX.     Someone from that office will call you to schedule an appointment.  ? ? ?Return in about 6 months (around 04/09/2022) for follow up. ? ?

## 2021-10-06 NOTE — Progress Notes (Signed)
? ? ? ? ?Subjective:  ? ? Patient ID: Valerie Mcclure, female    DOB: 10/10/1940, 81 y.o.   MRN: [id number] ? ?This visit occurred during the SARS-CoV-2 public health emergency.  Safety protocols were in place, including screening questions prior to the visit, additional usage of staff PPE, and extensive cleaning of exam room while observing appropriate contact time as indicated for disinfecting solutions.   ? ? 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 ? ?She is here with her daughter Suanne Marker.   ? ? ?Medications and allergies reviewed with patient and updated if appropriate. ? ?Current Outpatient Medications on File Prior to Visit  ?Medication Sig Dispense Refill  ? acetaminophen (TYLENOL) 500 MG tablet Take 1,000 mg by mouth every 8 (eight) hours as needed.    ? atorvastatin (LIPITOR) 20 MG tablet TAKE 1 TABLET(20 MG) BY MOUTH DAILY 90 tablet 3  ? CALCIUM-VITAMIN D PO Take by mouth.    ? ciclopirox (LOPROX) 0.77 % cream Apply topically 2 (two) times daily. 90 g 2  ? clonazePAM (KLONOPIN) 0.5 MG tablet Take 0.5 tablets (0.25 mg total) by mouth 2 (two) times daily as needed for anxiety. 30 tablet 0  ? ELIQUIS 2.5 MG TABS tablet TAKE 1 TABLET(2.5 MG) BY MOUTH TWICE DAILY 60 tablet 1  ? FLUoxetine (PROZAC) 40 MG capsule TAKE 1 CAPSULE(40 MG) BY MOUTH DAILY 90 capsule 0  ? hydrALAZINE (APRESOLINE) 25 MG tablet TAKE 1 TABLET(25 MG) BY MOUTH THREE TIMES DAILY 270 tablet 1  ? levothyroxine (SYNTHROID) 75 MCG tablet TAKE 1 TABLET(75 MCG) BY MOUTH DAILY BEFORE BREAKFAST. FOLLOW-UP 30 tablet 2  ? lisinopril (ZESTRIL) 2.5 MG tablet Take 2.5 mg by mouth daily.    ? metoprolol tartrate (LOPRESSOR) 25 MG tablet TAKE 1 TABLET(25 MG) BY MOUTH TWICE DAILY 180 tablet 1  ? nortriptyline (PAMELOR) 10 MG capsule TAKE 4  CAPSULES(40 MG) BY MOUTH AT BEDTIME 360 capsule 1  ? torsemide (DEMADEX) 20 MG tablet TAKE 1 TABLET(20 MG) BY MOUTH DAILY 30 tablet 5  ? vitamin B-12 (CYANOCOBALAMIN) 100 MCG tablet Take 100 mcg by mouth daily.    ? ?No current facility-administered medications on file prior to visit.  ? ? ? ?Review of Systems ? ?   ?Objective:  ?There were no vitals filed for this visit. ?BP Readings from Last 3 Encounters:  ?06/18/21 138/78  ?02/04/21 140/76  ?12/12/20 126/76  ? ?Wt Readings from Last 3 Encounters:  ?06/18/21 163 lb (73.9 kg)  ?02/04/21 161 lb (73 kg)  ?12/12/20 161 lb 6.4 oz (73.2 kg)  ? ?There is no height or weight on file to calculate BMI. ? ?  ?Physical Exam ?   ? ?Lab Results  ?Component Value Date  ? WBC 9.4 06/18/2021  ? HGB 12.7 06/18/2021  ? HCT 37.7 06/18/2021  ? PLT 307.0 06/18/2021  ? GLUCOSE 92 06/18/2021  ? CHOL 159 06/18/2021  ? TRIG 243.0 (H) 06/18/2021  ? HDL 55.20 06/18/2021  ? LDLDIRECT 64.0 06/18/2021  ? [place] 51 06/10/2020  ? ALT 14 06/18/2021  ? AST 18 06/18/2021  ? NA 141 06/18/2021  ? K 4.4 06/18/2021  ? CL 103 06/18/2021  ? CREATININE 1.69 (H) 06/18/2021  ? BUN 27 (H) 06/18/2021  ? CO2 29 06/18/2021  ? TSH 1.58 06/18/2021  ? INR  1.35 07/12/2017  ? HGBA1C 6.1 06/18/2021  ? ? ? ?Assessment & Plan:  ? ? ?See Problem List for Assessment and Plan of chronic medical problems.  ? ?This encounter was created in error - please disregard. ?

## 2021-10-07 ENCOUNTER — Encounter: Payer: HMO | Admitting: Internal Medicine

## 2021-10-07 DIAGNOSIS — I4819 Other persistent atrial fibrillation: Secondary | ICD-10-CM

## 2021-10-07 DIAGNOSIS — G479 Sleep disorder, unspecified: Secondary | ICD-10-CM

## 2021-10-07 DIAGNOSIS — F3289 Other specified depressive episodes: Secondary | ICD-10-CM

## 2021-10-07 DIAGNOSIS — I69320 Aphasia following cerebral infarction: Secondary | ICD-10-CM

## 2021-10-07 DIAGNOSIS — I69351 Hemiplegia and hemiparesis following cerebral infarction affecting right dominant side: Secondary | ICD-10-CM

## 2021-10-07 DIAGNOSIS — G8929 Other chronic pain: Secondary | ICD-10-CM

## 2021-10-07 DIAGNOSIS — R600 Localized edema: Secondary | ICD-10-CM

## 2021-10-07 DIAGNOSIS — E7849 Other hyperlipidemia: Secondary | ICD-10-CM

## 2021-10-07 DIAGNOSIS — R7303 Prediabetes: Secondary | ICD-10-CM

## 2021-10-07 DIAGNOSIS — E039 Hypothyroidism, unspecified: Secondary | ICD-10-CM

## 2021-10-07 DIAGNOSIS — I10 Essential (primary) hypertension: Secondary | ICD-10-CM

## 2021-10-07 DIAGNOSIS — N184 Chronic kidney disease, stage 4 (severe): Secondary | ICD-10-CM

## 2021-10-08 ENCOUNTER — Telehealth: Payer: Self-pay

## 2021-10-08 NOTE — Telephone Encounter (Signed)
Pt daughter [name] calling stating that her Sister [name] knows the pts next appt time and wants to know how that is possible. ? ?Melanie ask that [name] number be taken off of pt chart. I informed her that the pt would have to be the one to request that be done. ? ?FYI ?

## 2021-10-09 ENCOUNTER — Other Ambulatory Visit: Payer: Self-pay | Admitting: Internal Medicine

## 2021-10-12 ENCOUNTER — Encounter: Payer: Self-pay | Admitting: Internal Medicine

## 2021-10-12 NOTE — Patient Instructions (Addendum)
? ? ? ?  Blood work was ordered.   ? ? ?Medications changes include :  increase torsemide to 40 mg daily x 3 days then go back to one pill or 20 mg daily.  Bactroban ointment for the leg.   ? ? ?A referral was ordered for a social work for transportation ? ?A referral was ordered for a home nurse.   ? ? ?Return in about 6 months (around 04/15/2022) for 6 month f/u. ? ?

## 2021-10-12 NOTE — Progress Notes (Signed)
? ? ? ? ?Subjective:  ? ? Patient ID: Valerie Mcclure, female    DOB: 26-Mar-1941, 81 y.o.   MRN: [id number] ? ?This visit occurred during the SARS-CoV-2 public health emergency.  Safety protocols were in place, including screening questions prior to the visit, additional usage of staff PPE, and extensive cleaning of exam room while observing appropriate contact time as indicated for disinfecting solutions.   ? ? 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    She is here with her daughter [name] ? ? ?Her leg edema has been worse for the past 1-2 weeks.  She has some areas that are leaking clear fluid on the left lateral leg.  No fever/chills.  The legs are tight and a little uncomfortable, but no significant pain.  She has not seen any pus.  She denies any worsening of her shortness of breath.  She has been elevating the legs a lot, but it does not seem to be helping. ? ?She is taking all of her medications as prescribed. ? ? ? ? ? ?Medications and allergies reviewed with patient and updated if appropriate. ? ?Current Outpatient Medications on File Prior to Visit  ?Medication Sig Dispense Refill  ? acetaminophen (TYLENOL) 500 MG tablet Take 1,000 mg by mouth every 8 (eight) hours as needed.    ? atorvastatin (LIPITOR) 20 MG tablet TAKE 1 TABLET(20 MG) BY MOUTH DAILY 90 tablet 3  ? CALCIUM-VITAMIN D PO Take by mouth.    ? ciclopirox (LOPROX) 0.77 % cream Apply topically 2 (two) times daily. 90 g 2  ? clonazePAM (KLONOPIN) 0.5 MG tablet Take 0.5 tablets (0.25 mg total) by mouth 2 (two) times daily as needed for anxiety. 30 tablet 0  ? ELIQUIS 2.5 MG TABS tablet TAKE 1 TABLET(2.5 MG) BY MOUTH TWICE DAILY 60 tablet 1  ? FLUoxetine (PROZAC) 40 MG capsule TAKE 1 CAPSULE(40 MG) BY MOUTH DAILY 90 capsule 0  ?  hydrALAZINE (APRESOLINE) 25 MG tablet TAKE 1 TABLET(25 MG) BY MOUTH THREE TIMES DAILY 270 tablet 1  ? levothyroxine (SYNTHROID) 75 MCG tablet TAKE 1 TABLET(75 MCG) BY MOUTH DAILY BEFORE BREAKFAST. FOLLOW-UP 30 tablet 2  ? lisinopril (ZESTRIL) 2.5 MG tablet Take 2.5 mg by mouth daily.    ? metoprolol tartrate (LOPRESSOR) 25 MG tablet TAKE 1 TABLET(25 MG) BY MOUTH TWICE DAILY 180 tablet 1  ? nortriptyline (PAMELOR) 10 MG capsule TAKE 4 CAPSULES(40 MG) BY MOUTH AT BEDTIME 360 capsule 0  ? torsemide (DEMADEX) 20 MG tablet TAKE 1 TABLET(20 MG) BY MOUTH DAILY 30 tablet 5  ? vitamin B-12 (CYANOCOBALAMIN) 100 MCG tablet Take 100 mcg by mouth daily.    ? ?No current facility-administered medications on file prior to visit.  ? ? ? ?Review of Systems  ?Constitutional:  Negative for chills and fever.  ?Respiratory:  Negative for cough, shortness of breath and wheezing.   ?Cardiovascular:  Positive for leg swelling. Negative for chest pain and palpitations.  ?Gastrointestinal:  Positive for abdominal pain and diarrhea. Negative for blood in stool, constipation and nausea.  ?Neurological:  Negative for light-headedness and headaches.  ? ?   ?Objective:  ? ?Vitals:  ? 10/13/21 1434  ?BP: 124/68  ?Pulse: 74  ?Temp: 97.6 ?F (36.4 ?C)  ?SpO2: 99%  ? ?BP Readings from Last 3 Encounters:  ?10/13/21  124/68  ?06/18/21 138/78  ?02/04/21 140/76  ? ?Wt Readings from Last 3 Encounters:  ?10/13/21 171 lb (77.6 kg)  ?06/18/21 163 lb (73.9 kg)  ?02/04/21 161 lb (73 kg)  ? ?Body mass index is 29.35 kg/m?. ? ?  ?Physical Exam ?Constitutional:   ?   General: She is not in acute distress. ?   Appearance: Normal appearance.  ?HENT:  ?   Head: Normocephalic and atraumatic.  ?Eyes:  ?   Conjunctiva/sclera: Conjunctivae normal.  ?Cardiovascular:  ?   Rate and Rhythm: Normal rate and regular rhythm.  ?   Heart sounds: Normal heart sounds. No murmur heard. ?Pulmonary:  ?   Effort: Pulmonary effort is normal. No respiratory distress.  ?   Breath sounds:  Normal breath sounds. No wheezing.  ?Musculoskeletal:  ?   Cervical back: Neck supple.  ?   Right lower leg: Edema present.  ?   Left lower leg: Edema present.  ?Lymphadenopathy:  ?   Cervical: No cervical adenopathy.  ?Skin: ?   Findings: Lesion (Several ulcerations left lateral lower leg w/ clear fluid discharge-surrounding erythema not warm to touch, not tenderness compared to rest of her leg.  Several blisters on left anterior lower leg and a few smaller blisters on the right anterior lower leg) present. No rash.  ?Neurological:  ?   Mental Status: She is alert. Mental status is at baseline.  ?Psychiatric:     ?   Mood and Affect: Mood normal.     ?   Behavior: Behavior normal.  ? ?   ? ?Lab Results  ?Component Value Date  ? WBC 9.4 06/18/2021  ? HGB 12.7 06/18/2021  ? HCT 37.7 06/18/2021  ? PLT 307.0 06/18/2021  ? GLUCOSE 92 06/18/2021  ? CHOL 159 06/18/2021  ? TRIG 243.0 (H) 06/18/2021  ? HDL 55.20 06/18/2021  ? LDLDIRECT 64.0 06/18/2021  ? [place] 51 06/10/2020  ? ALT 14 06/18/2021  ? AST 18 06/18/2021  ? NA 141 06/18/2021  ? K 4.4 06/18/2021  ? CL 103 06/18/2021  ? CREATININE 1.69 (H) 06/18/2021  ? BUN 27 (H) 06/18/2021  ? CO2 29 06/18/2021  ? TSH 1.58 06/18/2021  ? INR 1.35 07/12/2017  ? HGBA1C 6.1 06/18/2021  ? ? ? ?Assessment & Plan:  ? ? ?See Problem List for Assessment and Plan of chronic medical problems.  ? ? ?

## 2021-10-13 ENCOUNTER — Encounter: Payer: Self-pay | Admitting: Internal Medicine

## 2021-10-13 ENCOUNTER — Ambulatory Visit (INDEPENDENT_AMBULATORY_CARE_PROVIDER_SITE_OTHER): Payer: HMO | Admitting: Internal Medicine

## 2021-10-13 ENCOUNTER — Other Ambulatory Visit: Payer: Self-pay

## 2021-10-13 ENCOUNTER — Telehealth: Payer: Self-pay | Admitting: Internal Medicine

## 2021-10-13 VITALS — BP 124/68 | HR 74 | Temp 97.6°F | Ht 64.0 in | Wt 171.0 lb

## 2021-10-13 DIAGNOSIS — F3289 Other specified depressive episodes: Secondary | ICD-10-CM

## 2021-10-13 DIAGNOSIS — I2510 Atherosclerotic heart disease of native coronary artery without angina pectoris: Secondary | ICD-10-CM | POA: Diagnosis not present

## 2021-10-13 DIAGNOSIS — L97921 Non-pressure chronic ulcer of unspecified part of left lower leg limited to breakdown of skin: Secondary | ICD-10-CM

## 2021-10-13 DIAGNOSIS — I10 Essential (primary) hypertension: Secondary | ICD-10-CM | POA: Diagnosis not present

## 2021-10-13 DIAGNOSIS — G479 Sleep disorder, unspecified: Secondary | ICD-10-CM | POA: Diagnosis not present

## 2021-10-13 DIAGNOSIS — R7303 Prediabetes: Secondary | ICD-10-CM | POA: Diagnosis not present

## 2021-10-13 DIAGNOSIS — I69351 Hemiplegia and hemiparesis following cerebral infarction affecting right dominant side: Secondary | ICD-10-CM | POA: Diagnosis not present

## 2021-10-13 DIAGNOSIS — E7849 Other hyperlipidemia: Secondary | ICD-10-CM | POA: Diagnosis not present

## 2021-10-13 DIAGNOSIS — I4819 Other persistent atrial fibrillation: Secondary | ICD-10-CM

## 2021-10-13 DIAGNOSIS — I5032 Chronic diastolic (congestive) heart failure: Secondary | ICD-10-CM | POA: Diagnosis not present

## 2021-10-13 DIAGNOSIS — E039 Hypothyroidism, unspecified: Secondary | ICD-10-CM

## 2021-10-13 DIAGNOSIS — L97929 Non-pressure chronic ulcer of unspecified part of left lower leg with unspecified severity: Secondary | ICD-10-CM | POA: Insufficient documentation

## 2021-10-13 DIAGNOSIS — G8929 Other chronic pain: Secondary | ICD-10-CM

## 2021-10-13 DIAGNOSIS — R1011 Right upper quadrant pain: Secondary | ICD-10-CM | POA: Diagnosis not present

## 2021-10-13 DIAGNOSIS — N184 Chronic kidney disease, stage 4 (severe): Secondary | ICD-10-CM

## 2021-10-13 DIAGNOSIS — R600 Localized edema: Secondary | ICD-10-CM

## 2021-10-13 LAB — CBC WITH DIFFERENTIAL/PLATELET
Basophils Absolute: 0 10*3/uL (ref 0.0–0.1)
Basophils Relative: 0.2 % (ref 0.0–3.0)
Eosinophils Absolute: 0.1 10*3/uL (ref 0.0–0.7)
Eosinophils Relative: 0.8 % (ref 0.0–5.0)
HCT: 37.2 % (ref 36.0–46.0)
Hemoglobin: 12.3 g/dL (ref 12.0–15.0)
Lymphocytes Relative: 23.2 % (ref 12.0–46.0)
Lymphs Abs: 2 10*3/uL (ref 0.7–4.0)
MCHC: 33 g/dL (ref 30.0–36.0)
MCV: 94.6 fl (ref 78.0–100.0)
Monocytes Absolute: 0.6 10*3/uL (ref 0.1–1.0)
Monocytes Relative: 7 % (ref 3.0–12.0)
Neutro Abs: 6.1 10*3/uL (ref 1.4–7.7)
Neutrophils Relative %: 68.8 % (ref 43.0–77.0)
Platelets: 297 10*3/uL (ref 150.0–400.0)
RBC: 3.93 Mil/uL (ref 3.87–5.11)
RDW: 14.3 % (ref 11.5–15.5)
WBC: 8.8 10*3/uL (ref 4.0–10.5)

## 2021-10-13 LAB — HEMOGLOBIN A1C: Hgb A1c MFr Bld: 6.6 % — ABNORMAL HIGH (ref 4.6–6.5)

## 2021-10-13 LAB — COMPREHENSIVE METABOLIC PANEL
ALT: 18 U/L (ref 0–35)
AST: 21 U/L (ref 0–37)
Albumin: 4.4 g/dL (ref 3.5–5.2)
Alkaline Phosphatase: 65 U/L (ref 39–117)
BUN: 32 mg/dL — ABNORMAL HIGH (ref 6–23)
CO2: 29 mEq/L (ref 19–32)
Calcium: 9.7 mg/dL (ref 8.4–10.5)
Chloride: 99 mEq/L (ref 96–112)
Creatinine, Ser: 1.87 mg/dL — ABNORMAL HIGH (ref 0.40–1.20)
GFR: 24.83 mL/min — ABNORMAL LOW (ref >60.00)
Glucose, Bld: 98 mg/dL (ref 70–99)
Potassium: 4.8 mEq/L (ref 3.5–5.1)
Sodium: 134 mEq/L — ABNORMAL LOW (ref 135–145)
Total Bilirubin: 0.3 mg/dL (ref 0.2–1.2)
Total Protein: 7.4 g/dL (ref 6.0–8.3)

## 2021-10-13 LAB — LIPID PANEL
Cholesterol: 153 mg/dL (ref 0–200)
HDL: 52.3 mg/dL (ref >39.00)
NonHDL: 100.98
Total CHOL/HDL Ratio: 3
Triglycerides: 244 mg/dL — ABNORMAL HIGH (ref 0.0–149.0)
VLDL: 48.8 mg/dL — ABNORMAL HIGH (ref 0.0–40.0)

## 2021-10-13 LAB — LDL CHOLESTEROL, DIRECT: Direct LDL: 57 mg/dL

## 2021-10-13 MED ORDER — MUPIROCIN 2 % EX OINT: 1.0000 "application " | TOPICAL_OINTMENT | Freq: Two times a day (BID) | CUTANEOUS | 2 refills | Status: DC

## 2021-10-13 NOTE — Assessment & Plan Note (Signed)
Chronic ?Had follow-up 3 to 4 months ago with nephrology ?Kidney function has been stable ?CMP ?We do need to increase her torsemide temporarily and advised we need to monitor her kidney function closely-May need to have home health nurse come and check blood work since transportation is an issue ?

## 2021-10-13 NOTE — Assessment & Plan Note (Signed)
Chronic  Clinically euthyroid Currently taking levothyroxine 75 mcg daily Check tsh  Titrate med dose if needed  

## 2021-10-13 NOTE — Assessment & Plan Note (Signed)
Chronic Check a1c Low sugar / carb diet Stressed regular exercise  

## 2021-10-13 NOTE — Assessment & Plan Note (Addendum)
Chronic ?No symptoms consistent with angina ?Continue atorvastatin 20 mg daily, Eliquis 2.5 mg twice daily, metoprolol 25 mg twice daily ?

## 2021-10-13 NOTE — Assessment & Plan Note (Signed)
Chronic Controlled, Stable Continue fluoxetine 40 mg daily 

## 2021-10-13 NOTE — Telephone Encounter (Signed)
Pts daughter called in to update pts son Valerie Mcclure's new address and telephone number for dpr ? ?Daughter stated a new dpr was submitted today  ? ?Address [address] [place], [place] [address]  ? ?Phone [phone] ?

## 2021-10-13 NOTE — Assessment & Plan Note (Signed)
Chronic ?Doing okay and only taking nortriptyline 40 mg nightly which she takes for her chronic radiculopathy pain-continue ?

## 2021-10-13 NOTE — Assessment & Plan Note (Signed)
Chronic ?Pain controlled ?Pain related to thoracic radiculopathy ?Continue nortriptyline 40 mg at bedtime ?

## 2021-10-13 NOTE — Assessment & Plan Note (Signed)
Chronic °Regular exercise and healthy diet encouraged °Check lipid panel  °Continue atorvastatin 20 mg daily °

## 2021-10-13 NOTE — Progress Notes (Unsigned)
Was called to the front by staff stating that a patient had fallen. Observed this patient in a sitting position on the floor in front of check out window. Pt stated her shoes got hung up on the carpet, causing her to fall. She also states that this is not the first time these particular shoes have caused problems with her losing balance.  Patient reports falling forward and catching herself.  Denies hitting head. Denies dizziness or loss of consciousness. Small, reddened area noted to palmar surface of left hand. Pt denies any complaints of pain. Patient refuses any further evaluation and refuses evaluation by a provider. Instructed patient that she should stop wearing that pair of shoes. Assisted patient to a standing position. Gait appears steady.  Daughter at patient's side. Patient observed getting on elevator and leaving practice.  ?

## 2021-10-13 NOTE — Assessment & Plan Note (Signed)
Acute on chronic ?Worsened edema for no obvious reason in the last 1-2 weeks ?Denies increased shortness of breath ?Has not increased sodium in her diet ?She is elevating legs when sitting, but not doing much exercise which I did advise her to start doing more walking around the house ?Has some leg ulcerations from the increased swelling ?Increase torsemide to 40 mg daily x3 days and then decrease back to 20 mg daily ?CMP today-we will need blood work done soon to recheck kidney function ?Transportation is an issue and she does not think she can come back for an appointment ?We will order home health nurse and social worker to help monitor wound and see if she is eligible for any transportation assistance-needs close follow-up ?

## 2021-10-13 NOTE — Assessment & Plan Note (Signed)
Chronic ?Denies palpitations ?Rate controlled ?Continue Eliquis 2.5 mg twice daily, metoprolol 25 mg twice daily ?Due for follow-up with cardiology ?

## 2021-10-13 NOTE — Assessment & Plan Note (Signed)
Chronic ?Continue Eliquis 2.5 mg twice daily, atorvastatin 20 mg daily ?Blood pressure well controlled ?Encouraged regular exercise ?

## 2021-10-13 NOTE — Assessment & Plan Note (Signed)
Chronic ?Blood pressure well controlled ?CMP ?Continue metoprolol 25 mg twice daily, lisinopril 2.5 mg daily, hydralazine 25 mg 3 times daily ?

## 2021-10-13 NOTE — Assessment & Plan Note (Signed)
Acute ?Increased bilateral leg swelling with ulcers left lower lateral leg-discharge is clear fluid-no pus ?No increase in warmth or tenderness in this 1 area so I do not think there is an infection ?Hopefully reducing the swelling by increasing torsemide will improve this area, but need to monitor closely because she could have a mild infection ?Start Bactroban ointment twice daily ?If the area does not improve over the next couple of days as the swelling decreases I will prescribe an antibiotic ?Will get home health nurse to come and help monitor ?

## 2021-10-13 NOTE — Assessment & Plan Note (Signed)
Chronic ?Legs are more swollen and she is fluid overloaded, but lungs are clear ?She is due for follow-up with her cardiologist ?Increase torsemide to 40 mg daily x3 days and then decrease back to 20 mg daily ?She is not able to get transportation to appointments so follow-up is going to be difficult-Home health nurse ordered, social work ordered to help with transportation/follow-up ?

## 2021-10-14 LAB — VITAMIN D 25 HYDROXY (VIT D DEFICIENCY, FRACTURES): VITD: 58.28 ng/mL (ref 30.00–100.00)

## 2021-10-14 LAB — TSH: TSH: 4.27 u[IU]/mL (ref 0.35–5.50)

## 2021-10-14 LAB — VITAMIN B12: Vitamin B-12: 754 pg/mL (ref 211–911)

## 2021-10-15 ENCOUNTER — Encounter: Payer: Self-pay | Admitting: Internal Medicine

## 2021-10-21 ENCOUNTER — Telehealth: Payer: Self-pay | Admitting: *Deleted

## 2021-10-21 NOTE — Chronic Care Management (AMB) (Signed)
?  Care Management  ? ?Note ? ?10/21/2021 ?Name: Valerie Mcclure MRN: [id number] DOB: Dec 27, 1940 ? ?[name] is a 81 y.o. year old female who is a primary care patient of Quay Burow, Claudina Lick, MD and is actively engaged with the care management team. I reached out to [name] by phone today to assist with scheduling an initial visit with the Licensed Clinical Social Worker ? ?Follow up plan: ?Unsuccessful telephone outreach attempt made. A HIPAA compliant phone message was left for the patient providing contact information and requesting a return call.  ?The care management team will reach out to the patient again over the next 7 days.  ?If patient returns call to provider office, please advise to call [place] at [phone]. ? ?Laverda Sorenson  ?Care Guide, Embedded Care Coordination ?[facility]  Care Management  ?Direct Dial: [phone] ? ?

## 2021-10-22 NOTE — Chronic Care Management (AMB) (Signed)
?  Care Management  ? ?Note ? ?10/22/2021 ?Name: Valerie Mcclure MRN: [id number] DOB: October 27, 1940 ? ?Valerie Mcclure is a 81 y.o. year old female who is a primary care patient of Quay Burow, Claudina Lick, MD and is actively engaged with the care management team. I reached out to [name] by phone today to assist with scheduling an initial visit with the Licensed Clinical Social Worker ? ?Follow up plan: ?Unsuccessful telephone outreach attempt made. The care management team will reach out to the patient again over the next 14 days. If patient returns call to provider office, please advise to call [facility] at [phone] ? ?Laverda Sorenson  ?Care Guide, Embedded Care Coordination ?[facility]  Care Management  ?Direct Dial: [phone] ? ?

## 2021-10-28 ENCOUNTER — Telehealth: Payer: Self-pay | Admitting: Internal Medicine

## 2021-10-28 ENCOUNTER — Encounter: Payer: Self-pay | Admitting: Internal Medicine

## 2021-10-28 ENCOUNTER — Telehealth (INDEPENDENT_AMBULATORY_CARE_PROVIDER_SITE_OTHER): Payer: HMO | Admitting: Internal Medicine

## 2021-10-28 DIAGNOSIS — J069 Acute upper respiratory infection, unspecified: Secondary | ICD-10-CM | POA: Diagnosis not present

## 2021-10-28 MED ORDER — DOXYCYCLINE HYCLATE 100 MG PO TABS: 100.0000 mg | ORAL_TABLET | Freq: Two times a day (BID) | ORAL | 0 refills | Status: AC

## 2021-10-28 NOTE — Progress Notes (Signed)
Virtual Visit via Video Note ? ?I connected with Valerie Mcclure on 10/28/21 at  3:40 PM EDT by a video enabled telemedicine application and verified that I am speaking with the correct person using two identifiers. ?  ?I discussed the limitations of evaluation and management by telemedicine and the availability of in person appointments. The patient expressed understanding and agreed to proceed. ? ?Present for the visit:  Myself, Dr Billey Gosling, Valerie Mcclure and her daughter Valerie Mcclure.  The patient is currently at home and I am in the office.   ? ?No referring provider.  ? ? ?History of Present Illness: ?She is here for an acute visit for cold symptoms.  ? ?Her symptoms started 4 days ? ?She is experiencing fatigue, sore throat, cough.  The cough has gotten progressively worse and she did not sleep last night.  Her daughter states it sounds like she has to bring something up but cannot.  She has not had any known fevers or chills.  She has not had any shortness of breath. ? ?She has not tried taking anything for symptoms-her doctor was unsure what to get her with all of her medications and medical problems. ? ? ?Review of Systems  ?Constitutional:  Positive for malaise/fatigue. Negative for chills and fever.  ?HENT:  Positive for sore throat. Negative for congestion, ear pain and sinus pain.   ?Respiratory:  Positive for cough. Negative for shortness of breath and wheezing.   ?Gastrointestinal:  Negative for abdominal pain and nausea.  ?Musculoskeletal:  Negative for myalgias.  ?Neurological:  Negative for dizziness and headaches.   ? ? ?Social History  ? ?Socioeconomic History  ? Marital status: Married  ?  Spouse name: Not on file  ? Number of children: 1  ? Years of education: Not on file  ? Highest education level: Not on file  ?Occupational History  ? Occupation: retired  ?Tobacco Use  ? Smoking status: Never  ? Smokeless tobacco: Never  ?Vaping Use  ? Vaping Use: Never used  ?Substance and Sexual  Activity  ? Alcohol use: No  ? Drug use: No  ? Sexual activity: Not Currently  ?Other Topics Concern  ? Not on file  ?Social History Narrative  ? Married  ? ?Social Determinants of Health  ? ?Financial Resource Strain: Not on file  ?Food Insecurity: Not on file  ?Transportation Needs: Not on file  ?Physical Activity: Not on file  ?Stress: Not on file  ?Social Connections: Not on file  ? ?  ?Observations/Objective: ? ? ? ?Assessment and Plan: ? ?See Problem List for Assessment and Plan of chronic medical problems. ? ? ?Follow Up Instructions: ? ?  ?I discussed the assessment and treatment plan with the patient. The patient was provided an opportunity to ask questions and all were answered. The patient agreed with the plan and demonstrated an understanding of the instructions. ?  ?The patient was advised to call back or seek an in-person evaluation if the symptoms worsen or if the condition fails to improve as anticipated. ? ?Time spent on telephone call -8 minutes ? ? ? ?[name], MD ? ?

## 2021-10-28 NOTE — Assessment & Plan Note (Signed)
Acute ?Cold symptoms started a few days ago and cough has progressively worsened ?Unfortunately she was not able to come in today we are very to limited since I cannot evaluate her in person ?Symptoms could be viral in nature, but I am concerned about her age and medical problems and the possibility of a bacterial infection ?Her cough is starting to sound more productive ?Start doxycycline 100 mg twice daily x10 days ?Advised that she can take any Coricidin cold products ?Advised cough suppressants ?Advised her daughter to call if she has any questions or if her symptoms continue to get worse ?

## 2021-10-29 ENCOUNTER — Telehealth: Payer: HMO | Admitting: Family Medicine

## 2021-11-04 NOTE — Chronic Care Management (AMB) (Signed)
?  Care Management  ? ?Note ? ?11/04/2021 ?Name: Valerie Mcclure MRN: [id number] DOB: 02-03-1941 ? ?Valerie Mcclure is a 81 y.o. year old female who is a primary care patient of Valerie Mcclure, Valerie Lick, MD and is actively engaged with the care management team. I reached out to Valerie Mcclure by phone today to assist with scheduling an initial visit with the Licensed Clinical Social Worker ? ?Follow up plan: ?Telephone appointment with care management team member scheduled for:11/11/21 ? ?Valerie Mcclure  ?Care Guide, Embedded Care Coordination ?  Care Management  ?Direct Dial: [phone] ? ?

## 2021-11-05 ENCOUNTER — Telehealth: Payer: Self-pay

## 2021-11-05 NOTE — Telephone Encounter (Signed)
? ?  Telephone encounter was:  Successful.  ?11/05/2021 ?Name: Valerie Mcclure MRN: [id number] DOB: 03-05-1941 ? ?Valerie Mcclure is a 81 y.o. year old female who is a primary care patient of Burns, Claudina Lick, MD . The community resource team was consulted for assistance with Transportation Needs  ? ?Care guide performed the following interventions:  Spoke to son Jimmye Norman about transportation. Son advised he is just interested in options for transportation at this time as he usually gets pt where she needs to go. Pt uses a cane only.  Son also inquired about legal support . ? ?Follow Up Plan:  Care guide will outreach resources to assist patient with transportation and legal support. ? ?Cameron Proud ?Care Guide, Embedded Care Coordination ?[facility]  Care management  ?[place], [place]  ?Main Phone: [phone]  E-mail: [email or web address]  ?Website: [email or web address] ? ? ? ?

## 2021-11-05 NOTE — Telephone Encounter (Signed)
Pt daughter Valerie Mcclure called stating that she needed a letter stating that the pt is incompetent and can not handle her financial affairs. I advise that the pt would need to be seen as that would be a change in mental status. Valerie Mcclure states that "I am her POA  and I advised her that her name is listed. She said the one y'all have is null and void he is dead." I advised her that we don't have a updated one on file but if she has one to please bring to have it on chart. She states that she really needed the letter bc her sister and step brother are taking all of her mothers SS. I advised her that from our stand point we cant do anything about the legal matter however she should make a report if its fraudulent things happening with her mother and we disconnected the call. ? ?FYI  ?

## 2021-11-06 ENCOUNTER — Telehealth: Payer: Self-pay

## 2021-11-06 MED ORDER — BENZONATATE 100 MG PO CAPS: 100.0000 mg | ORAL_CAPSULE | Freq: Three times a day (TID) | ORAL | 1 refills | Status: DC | PRN

## 2021-11-06 NOTE — Telephone Encounter (Signed)
Pt daughter [name] calling in reporting that the pt cough isnt getting any better. Pt has 2 more days of abx remaining.  ? ?Pt was scheduled for an appt for 4/21 but they can only make it if they can find a ride.  ? ?Per Nurse I advise the daughter that I will sat up an appt and also let Dr. Quay Burow know whats going on and the nurse will call her and let her know if she needs to come in. ? ?Please advise ?

## 2021-11-06 NOTE — Telephone Encounter (Signed)
Should be seen in person.  There are a lot of viruses going around that are causing a prolonged cough so more antibiotic may not help.  We can send in tessalon perles to  help with the cough if needed.   ?

## 2021-11-06 NOTE — Addendum Note (Signed)
Addended by: [name] on: 11/06/2021 03:47 PM ? ? Modules accepted: Orders ? ?

## 2021-11-07 ENCOUNTER — Ambulatory Visit: Payer: HMO | Admitting: Internal Medicine

## 2021-11-11 ENCOUNTER — Other Ambulatory Visit: Payer: Self-pay | Admitting: Internal Medicine

## 2021-11-11 ENCOUNTER — Ambulatory Visit (INDEPENDENT_AMBULATORY_CARE_PROVIDER_SITE_OTHER): Payer: HMO | Admitting: Licensed Clinical Social Worker

## 2021-11-11 ENCOUNTER — Telehealth: Payer: Self-pay

## 2021-11-11 DIAGNOSIS — I10 Essential (primary) hypertension: Secondary | ICD-10-CM

## 2021-11-11 DIAGNOSIS — Z7189 Other specified counseling: Secondary | ICD-10-CM

## 2021-11-11 DIAGNOSIS — I5032 Chronic diastolic (congestive) heart failure: Secondary | ICD-10-CM

## 2021-11-11 NOTE — Telephone Encounter (Signed)
? ?  Telephone encounter was:  Successful.  ?11/11/2021 ?Name: Valerie Mcclure MRN: [id number] DOB: 29-Jun-1941 ? ?Valerie Mcclure is a 81 y.o. year old female who is a primary care patient of Burns, Claudina Lick, MD . The community resource team was consulted for assistance with Transportation Needs  ? ?Care guide performed the following interventions: Follow up call placed to the patient to discuss status of referral. I spoke to son [name] advising e-mail was sent pertaining to transportation vendors providing their brochures and applications as son requested. Son explained that pt only needs transportation when family can not take her but he advised 99% percent of the time, pt is covered with transportation. I also sent legal resources as well. ?E-mail enclosed: [employer], [employer], [place], [place], [employer], [place], and [place]. ? ?Follow Up Plan:  No further follow up planned at this time. The patient has been provided with needed resources. At this time, no further resources are needed and there are no further questions or concerns at this time. ? ?Cameron Proud ?Care Guide, Embedded Care Coordination ?[facility]  Care management  ?[place], [place]  ?Main Phone: [phone]  E-mail: [email or web address]  ?Website: [email or web address] ? ? ? ?

## 2021-11-11 NOTE — Patient Instructions (Signed)
Visit Information ? ?Thank you for taking time to visit with me today. Please don't hesitate to contact me if I can be of assistance to you before our next scheduled telephone appointment. ? ?Following are the goals discussed today with your son [name]: Transportation concerns ? ?Please call the care guide team at [phone] if you need to cancel or reschedule your appointment.  ? ?No follow up scheduled, per our conversation you do not desire continued follow up ?I will disconnect from your care team at this time, please call the office if additional needs are identified  ? ?Casimer Lanius, LCSW ?Licensed Clinical Social Worker [facility]  ?[place]  ?[phone]  ?

## 2021-11-11 NOTE — Chronic Care Management (AMB) (Signed)
? ?  Social Work  ?Care Coordination Note ?11/11/2021 ?Name: Valerie Mcclure MRN: [id number] DOB: Oct 30, 1940 ? ?Valerie Mcclure is a 81 y.o. year old female who is a primary care patient of Burns, Claudina Lick, MD. The CCM team was consulted for assistance with care coordination needs: Transportation Needs  and Level of Care Concerns .   ? ? ?Engaged with patient 's son Valerie Mcclure by telephone for initial visit in response to provider referral. ?CCM Care Team: pharmacy only ; SW will disconnect after this encounter. ? ?Summary: Valerie Mcclure reports no needs, patient lives with daughter and he lives across the stress.  Patient does well at home. They have family support  .  ? ?Follow up Plan:  ?Patient's son does not desire continued follow-up by CCM LCSW. Will contact the office if needed ?CCM LCSW will disconnect from patient's care team at this time, but will be available at any time they would like to re-engage for care coordination services.  ?  ?SDOH (Social Determinants of Health) screening and interventions performed today:  ?  ?Interventions: ?Review various resources, discussed options and provided information: ?[employer] transportation (Application e-mailed per son's request) ?Private pay options for personal care needs :(Not needed at this time) ? ?Review of patient past medical history, allergies, medications, and health status, including review of pertinent consultant reports was performed as part of comprehensive evaluation and provision of care management/care coordination services.  ?  ?No Care Plan  Established during this encounter ?  ?  ?Casimer Lanius, LCSW ?Licensed Clinical Social Worker [facility]  ?[place]  ?[phone]  ?   ? ?   ?

## 2021-11-12 ENCOUNTER — Encounter: Payer: Self-pay | Admitting: Internal Medicine

## 2021-11-12 ENCOUNTER — Ambulatory Visit (INDEPENDENT_AMBULATORY_CARE_PROVIDER_SITE_OTHER): Payer: HMO | Admitting: Internal Medicine

## 2021-11-12 ENCOUNTER — Ambulatory Visit (INDEPENDENT_AMBULATORY_CARE_PROVIDER_SITE_OTHER): Payer: HMO

## 2021-11-12 VITALS — BP 140/68 | HR 82 | Temp 97.6°F | Ht 64.0 in

## 2021-11-12 DIAGNOSIS — J069 Acute upper respiratory infection, unspecified: Secondary | ICD-10-CM

## 2021-11-12 DIAGNOSIS — R9389 Abnormal findings on diagnostic imaging of other specified body structures: Secondary | ICD-10-CM

## 2021-11-12 DIAGNOSIS — R059 Cough, unspecified: Secondary | ICD-10-CM | POA: Diagnosis not present

## 2021-11-12 MED ORDER — DOXYCYCLINE HYCLATE 100 MG PO TABS: 100.0000 mg | ORAL_TABLET | Freq: Two times a day (BID) | ORAL | 0 refills | Status: AC

## 2021-11-12 MED ORDER — HYDROCODONE BIT-HOMATROP MBR 5-1.5 MG/5ML PO SOLN: 5.0000 mL | Freq: Three times a day (TID) | ORAL | 0 refills | Status: DC | PRN

## 2021-11-12 NOTE — Progress Notes (Signed)
? ? ?Subjective:  ? ? Patient ID: Valerie Mcclure, female    DOB: 18-Mar-1941, 81 y.o.   MRN: [id number] ? ?This visit occurred during the SARS-CoV-2 public health emergency.  Safety protocols were in place, including screening questions prior to the visit, additional usage of staff PPE, and extensive cleaning of exam room while observing appropriate contact time as indicated for disinfecting solutions. ? ? ? ?HPI ?Valerie Mcclure is here for  ?Chief Complaint  ?Patient presents with  ? Nasal Congestion  ?  Congestion and cough  ? ?She is here for an acute visit for cold symptoms.  ? ?Her symptoms started 3 weeks ago ? ?She is experiencing decreased appetite, postnasal drip, sore throat, hoarseness, cough that is wet sounding, dizziness/lightheadedness and headaches.  Her and her daughter deny any obvious fevers.  She denies shortness of breath, wheezing. ? ?She has tried taking over-the-counter cough suppressants and Tessalon Perles. ? ? ? ? ? ? ?Medications and allergies reviewed with patient and updated if appropriate. ? ?Current Outpatient Medications on File Prior to Visit  ?Medication Sig Dispense Refill  ? acetaminophen (TYLENOL) 500 MG tablet Take 1,000 mg by mouth every 8 (eight) hours as needed.    ? atorvastatin (LIPITOR) 20 MG tablet TAKE 1 TABLET(20 MG) BY MOUTH DAILY 90 tablet 3  ? benzonatate (TESSALON PERLES) 100 MG capsule Take 1 capsule (100 mg total) by mouth 3 (three) times daily as needed for cough. 30 capsule 1  ? CALCIUM-VITAMIN D PO Take by mouth.    ? ciclopirox (LOPROX) 0.77 % cream Apply topically 2 (two) times daily. 90 g 2  ? clonazePAM (KLONOPIN) 0.5 MG tablet Take 0.5 tablets (0.25 mg total) by mouth 2 (two) times daily as needed for anxiety. 30 tablet 0  ? ELIQUIS 2.5 MG TABS tablet TAKE 1 TABLET(2.5 MG) BY MOUTH TWICE DAILY 60 tablet 1  ? FLUoxetine (PROZAC) 40 MG capsule TAKE 1 CAPSULE(40 MG) BY MOUTH DAILY 90 capsule 0  ? hydrALAZINE (APRESOLINE) 25 MG tablet TAKE 1 TABLET(25 MG) BY  MOUTH THREE TIMES DAILY 270 tablet 1  ? levothyroxine (SYNTHROID) 75 MCG tablet TAKE 1 TABLET(75 MCG) BY MOUTH DAILY BEFORE BREAKFAST. FOLLOW-UP 30 tablet 2  ? lisinopril (ZESTRIL) 2.5 MG tablet Take 2.5 mg by mouth daily.    ? metoprolol tartrate (LOPRESSOR) 25 MG tablet TAKE 1 TABLET(25 MG) BY MOUTH TWICE DAILY 180 tablet 1  ? mupirocin ointment (BACTROBAN) 2 % Apply 1 application. topically 2 (two) times daily. 30 g 2  ? nortriptyline (PAMELOR) 10 MG capsule TAKE 4 CAPSULES(40 MG) BY MOUTH AT BEDTIME 360 capsule 0  ? torsemide (DEMADEX) 20 MG tablet TAKE 1 TABLET(20 MG) BY MOUTH DAILY 30 tablet 5  ? vitamin B-12 (CYANOCOBALAMIN) 100 MCG tablet Take 100 mcg by mouth daily.    ? ?No current facility-administered medications on file prior to visit.  ? ? ?Review of Systems  ?Constitutional:  Positive for appetite change (dec). Negative for fever.  ?HENT:  Positive for postnasal drip, sore throat (mild) and voice change. Negative for congestion, ear pain, sinus pressure and sinus pain.   ?Respiratory:  Positive for cough (wet sounding). Negative for shortness of breath and wheezing.   ?Gastrointestinal:  Negative for diarrhea and nausea.  ?Neurological:  Positive for dizziness, light-headedness and headaches (occ).  ? ?   ?Objective:  ? ?Vitals:  ? 11/12/21 1418  ?BP: 140/68  ?Pulse: 82  ?Temp: 97.6 ?F (36.4 ?C)  ?SpO2: 99%  ? ?BP Readings  from Last 3 Encounters:  ?11/12/21 140/68  ?10/13/21 124/68  ?06/18/21 138/78  ? ?Wt Readings from Last 3 Encounters:  ?10/13/21 171 lb (77.6 kg)  ?06/18/21 163 lb (73.9 kg)  ?02/04/21 161 lb (73 kg)  ? ?Body mass index is 29.35 kg/m?. ? ?  ?Physical Exam ?Constitutional:   ?   General: She is not in acute distress. ?   Appearance: She is ill-appearing (Mildly ill-appearing).  ?HENT:  ?   Head: Normocephalic and atraumatic.  ?Eyes:  ?   Conjunctiva/sclera: Conjunctivae normal.  ?Cardiovascular:  ?   Rate and Rhythm: Normal rate and regular rhythm.  ?   Heart sounds: Normal heart  sounds. No murmur heard. ?Pulmonary:  ?   Effort: Pulmonary effort is normal. No respiratory distress.  ?   Breath sounds: Normal breath sounds. No wheezing.  ?Musculoskeletal:  ?   Cervical back: Neck supple.  ?   Right lower leg: No edema.  ?   Left lower leg: No edema.  ?Lymphadenopathy:  ?   Cervical: No cervical adenopathy.  ?Skin: ?   General: Skin is warm and dry.  ?   Findings: No rash.  ?Neurological:  ?   Mental Status: She is alert. Mental status is at baseline.  ?Psychiatric:     ?   Mood and Affect: Mood normal.     ?   Behavior: Behavior normal.  ? ?   ?DG Chest 2 View ?CLINICAL DATA:  Cough ? ?EXAM: ?CHEST - 2 VIEW ? ?COMPARISON:  Chest x-ray dated December 21, 2016 ? ?FINDINGS: ?Cardiac and mediastinal contours are within normal limits. Mild ?opacity of the left costophrenic angle. No focal consolidation. No ?pleural effusion or pneumothorax. ? ?IMPRESSION: ?Mild opacity of the left costophrenic angle, favor atelectasis. No ?focal consolidation. Recommend follow-up chest x-ray in 3-4 weeks to ?ensure resolution. ? ?Electronically Signed ?  By: Yetta Glassman M.D. ?  On: 11/12/2021 15:05 ? ? ? ? ? ? ?Assessment & Plan:  ? ? ?See Problem List for Assessment and Plan of chronic medical problems.  ? ? ? ? ?

## 2021-11-12 NOTE — Patient Instructions (Addendum)
? ? ? ?  A chest xray was ordered.   ? ? ?Medications changes include :   doxycyline and cough syrup ? ? ?Your prescription(s) have been sent to your pharmacy.  ? ?.  ? ? ?Return if symptoms worsen or fail to improve. ? ?

## 2021-11-12 NOTE — Assessment & Plan Note (Signed)
Subacute ?Her symptoms started 3 weeks ago and I am concerned that she does not seem to be progressing in the right direction ?Concerned about the possibility of pneumonia-chest x-ray today ?We will be cautious and start doxycycline 100 mg twice daily x10 days ?Hycodan cough syrup 5 mils every 8 hours as needed-her and her daughter are aware this may cause some drowsiness ?Can continue Tessalon Perles 3 times a day as needed ?Advised to start Mucinex ?Continue increased fluids ?Call if no improvement or if symptoms worsen ? ?

## 2021-11-17 ENCOUNTER — Telehealth: Payer: Self-pay | Admitting: Internal Medicine

## 2021-11-17 MED ORDER — PREDNISONE 20 MG PO TABS: 40.0000 mg | ORAL_TABLET | Freq: Every day | ORAL | 0 refills | Status: AC

## 2021-11-17 NOTE — Telephone Encounter (Signed)
Spoke with [employer] today and info given. ?

## 2021-11-17 NOTE — Telephone Encounter (Signed)
States PT is not getting any better, abx is not helping her at all.  ? ?Requesting that assistant callback ASAP.  ?

## 2021-11-17 NOTE — Telephone Encounter (Signed)
Continue current abx. ? ?Lets add a steroid to see if that helps.  Sent to pharmacy. ? ?They should call us with an update in a few days ?

## 2021-12-01 ENCOUNTER — Other Ambulatory Visit: Payer: Self-pay | Admitting: Internal Medicine

## 2021-12-03 ENCOUNTER — Encounter: Payer: Self-pay | Admitting: Internal Medicine

## 2021-12-03 NOTE — Progress Notes (Signed)
Subjective:    Patient ID: Valerie Mcclure, female    DOB: [birth year], 81 y.o.   MRN: [id number]      HPI Valerie Mcclure is here for  Chief Complaint  Patient presents with   Annual Exam       Medications and allergies reviewed with patient and updated if appropriate.  Current Outpatient Medications on File Prior to Visit  Medication Sig Dispense Refill   acetaminophen (TYLENOL) 500 MG tablet Take 1,000 mg by mouth every 8 (eight) hours as needed.     atorvastatin (LIPITOR) 20 MG tablet TAKE 1 TABLET(20 MG) BY MOUTH DAILY 90 tablet 3   benzonatate (TESSALON PERLES) 100 MG capsule Take 1 capsule (100 mg total) by mouth 3 (three) times daily as needed for cough. 30 capsule 1   CALCIUM-VITAMIN D PO Take by mouth.     ciclopirox (LOPROX) 0.77 % cream Apply topically 2 (two) times daily. 90 g 2   clonazePAM (KLONOPIN) 0.5 MG tablet Take 0.5 tablets (0.25 mg total) by mouth 2 (two) times daily as needed for anxiety. 30 tablet 0   ELIQUIS 2.5 MG TABS tablet TAKE 1 TABLET(2.5 MG) BY MOUTH TWICE DAILY 60 tablet 1   FLUoxetine (PROZAC) 40 MG capsule TAKE 1 CAPSULE(40 MG) BY MOUTH DAILY 90 capsule 0   hydrALAZINE (APRESOLINE) 25 MG tablet TAKE 1 TABLET(25 MG) BY MOUTH THREE TIMES DAILY 270 tablet 1   HYDROcodone bit-homatropine (HYCODAN) 5-1.5 MG/5ML syrup Take 5 mLs by mouth every 8 (eight) hours as needed for cough. 120 mL 0   levothyroxine (SYNTHROID) 75 MCG tablet TAKE 1 TABLET(75 MCG) BY MOUTH DAILY BEFORE BREAKFAST. FOLLOW-UP 30 tablet 2   lisinopril (ZESTRIL) 2.5 MG tablet Take 2.5 mg by mouth daily.     metoprolol tartrate (LOPRESSOR) 25 MG tablet TAKE 1 TABLET(25 MG) BY MOUTH TWICE DAILY 180 tablet 1   mupirocin ointment (BACTROBAN) 2 % Apply 1 application. topically 2 (two) times daily. 30 g 2   nortriptyline (PAMELOR) 10 MG capsule TAKE 4 CAPSULES(40 MG) BY MOUTH AT BEDTIME 360 capsule 0   torsemide (DEMADEX) 20 MG tablet TAKE 1 TABLET(20 MG) BY MOUTH DAILY 30 tablet 5    vitamin B-12 (CYANOCOBALAMIN) 100 MCG tablet Take 100 mcg by mouth daily.     No current facility-administered medications on file prior to visit.    Review of Systems     Objective:  There were no vitals filed for this visit. There were no vitals filed for this visit. There is no height or weight on file to calculate BMI.  BP Readings from Last 3 Encounters:  11/12/21 140/68  10/13/21 124/68  06/18/21 138/78    Wt Readings from Last 3 Encounters:  10/13/21 171 lb (77.6 kg)  06/18/21 163 lb (73.9 kg)  02/04/21 161 lb (73 kg)       11/12/2021    2:26 PM 06/25/2020    3:24 PM 02/22/2020    9:08 AM 09/06/2019    9:32 AM 06/13/2019    3:07 PM  Depression screen PHQ 2/9  Decreased Interest 0 0 0 0 0  Down, Depressed, Hopeless 0 0 0 0 1  PHQ - 2 Score 0 0 0 0 1  Altered sleeping     0  Tired, decreased energy     0  Change in appetite     0  Feeling bad or failure about yourself      0  Trouble concentrating     0  Moving slowly or fidgety/restless     0  Suicidal thoughts     0  PHQ-9 Score     1  Difficult doing work/chores     Not difficult at all         View : No data to display.              Physical Exam Constitutional: She appears well-developed and well-nourished. No distress.  HENT:  Head: Normocephalic and atraumatic.  Right Ear: External ear normal. Normal ear canal and TM Left Ear: External ear normal.  Normal ear canal and TM Mouth/Throat: Oropharynx is clear and moist.  Eyes: Conjunctivae normal.  Neck: Neck supple. No tracheal deviation present. No thyromegaly present.  No carotid bruit  Cardiovascular: Normal rate, regular rhythm and normal heart sounds.   No murmur heard.  No edema. Pulmonary/Chest: Effort normal and breath sounds normal. No respiratory distress. She has no wheezes. She has no rales.  Breast: deferred   Abdominal: Soft. She exhibits no distension. There is no tenderness.  Lymphadenopathy: She has no cervical adenopathy.   Skin: Skin is warm and dry. She is not diaphoretic.  Psychiatric: She has a normal mood and affect. Her behavior is normal.     Lab Results  Component Value Date   WBC 8.8 10/13/2021   HGB 12.3 10/13/2021   HCT 37.2 10/13/2021   PLT 297.0 10/13/2021   GLUCOSE 98 10/13/2021   CHOL 153 10/13/2021   TRIG 244.0 (H) 10/13/2021   HDL 52.30 10/13/2021   LDLDIRECT 57.0 10/13/2021   LDLCALC 51 06/10/2020   ALT 18 10/13/2021   AST 21 10/13/2021   NA 134 (L) 10/13/2021   K 4.8 10/13/2021   CL 99 10/13/2021   CREATININE 1.87 (H) 10/13/2021   BUN 32 (H) 10/13/2021   CO2 29 10/13/2021   TSH 4.27 10/13/2021   INR 1.35 07/12/2017   HGBA1C 6.6 (H) 10/13/2021         Assessment & Plan:   Physical exam: Screening blood work  ordered Exercise   Weight   Substance abuse  none   Reviewed recommended immunizations.   Health Maintenance  Topic Date Due   FOOT EXAM  Never done   OPHTHALMOLOGY EXAM  Never done   Zoster Vaccines- Shingrix (1 of 2) Never done   TETANUS/TDAP  07/21/2015   COVID-19 Vaccine (4 - Booster for Moderna series) 08/27/2020   INFLUENZA VACCINE  02/17/2022   HEMOGLOBIN A1C  04/15/2022   Pneumonia Vaccine 56+ Years old  Completed   DEXA SCAN  Completed   HPV VACCINES  Aged Out          See Problem List for Assessment and Plan of chronic medical problems.    This encounter was created in error - please disregard.

## 2021-12-03 NOTE — Patient Instructions (Addendum)
? ? ? ?Blood work was ordered.   ? ? ?Medications changes include :    ? ? ?Your prescription(s) have been sent to your pharmacy.  ? ? ?A referral was ordered for XX.     Someone from that office will call you to schedule an appointment.  ? ? ?Return in about 6 months (around 06/06/2022). ? ? ?Health Maintenance, Female ?Adopting a healthy lifestyle and getting preventive care are important in promoting health and wellness. Ask your health care provider about: ?The right schedule for you to have regular tests and exams. ?Things you can do on your own to prevent diseases and keep yourself healthy. ?What should I know about diet, weight, and exercise? ?Eat a healthy diet ? ?Eat a diet that includes plenty of vegetables, fruits, low-fat dairy products, and lean protein. ?Do not eat a lot of foods that are high in solid fats, added sugars, or sodium. ?Maintain a healthy weight ?Body mass index (BMI) is used to identify weight problems. It estimates body fat based on height and weight. Your health care provider can help determine your BMI and help you achieve or maintain a healthy weight. ?Get regular exercise ?Get regular exercise. This is one of the most important things you can do for your health. Most adults should: ?Exercise for at least 150 minutes each week. The exercise should increase your heart rate and make you sweat (moderate-intensity exercise). ?Do strengthening exercises at least twice a week. This is in addition to the moderate-intensity exercise. ?Spend less time sitting. Even light physical activity can be beneficial. ?Watch cholesterol and blood lipids ?Have your blood tested for lipids and cholesterol at 82 years of age, then have this test every 5 years. ?Have your cholesterol levels checked more often if: ?Your lipid or cholesterol levels are high. ?You are older than 82 years of age. ?You are at high risk for heart disease. ?What should I know about cancer screening? ?Depending on your health  history and family history, you may need to have cancer screening at various ages. This may include screening for: ?Breast cancer. ?Cervical cancer. ?Colorectal cancer. ?Skin cancer. ?Lung cancer. ?What should I know about heart disease, diabetes, and high blood pressure? ?Blood pressure and heart disease ?High blood pressure causes heart disease and increases the risk of stroke. This is more likely to develop in people who have high blood pressure readings or are overweight. ?Have your blood pressure checked: ?Every 3-5 years if you are 36-48 years of age. ?Every year if you are 66 years old or older. ?Diabetes ?Have regular diabetes screenings. This checks your fasting blood sugar level. Have the screening done: ?Once every three years after age 9 if you are at a normal weight and have a low risk for diabetes. ?More often and at a younger age if you are overweight or have a high risk for diabetes. ?What should I know about preventing infection? ?Hepatitis B ?If you have a higher risk for hepatitis B, you should be screened for this virus. Talk with your health care provider to find out if you are at risk for hepatitis B infection. ?Hepatitis C ?Testing is recommended for: ?Everyone born from [birth year] through 1966. ?Anyone with known risk factors for hepatitis C. ?Sexually transmitted infections (STIs) ?Get screened for STIs, including gonorrhea and chlamydia, if: ?You are sexually active and are younger than 82 years of age. ?You are older than 82 years of age and your health care provider tells you that you are  at risk for this type of infection. ?Your sexual activity has changed since you were last screened, and you are at increased risk for chlamydia or gonorrhea. Ask your health care provider if you are at risk. ?Ask your health care provider about whether you are at high risk for HIV. Your health care provider may recommend a prescription medicine to help prevent HIV infection. If you choose to take medicine to  prevent HIV, you should first get tested for HIV. You should then be tested every 3 months for as long as you are taking the medicine. ?Pregnancy ?If you are about to stop having your period (premenopausal) and you may become pregnant, seek counseling before you get pregnant. ?Take 400 to 800 micrograms (mcg) of folic acid every day if you become pregnant. ?Ask for birth control (contraception) if you want to prevent pregnancy. ?Osteoporosis and menopause ?Osteoporosis is a disease in which the bones lose minerals and strength with aging. This can result in bone fractures. If you are 1 years old or older, or if you are at risk for osteoporosis and fractures, ask your health care provider if you should: ?Be screened for bone loss. ?Take a calcium or vitamin D supplement to lower your risk of fractures. ?Be given hormone replacement therapy (HRT) to treat symptoms of menopause. ?Follow these instructions at home: ?Alcohol use ?Do not drink alcohol if: ?Your health care provider tells you not to drink. ?You are pregnant, may be pregnant, or are planning to become pregnant. ?If you drink alcohol: ?Limit how much you have to: ?0-1 drink a day. ?Know how much alcohol is in your drink. In the [place] one drink equals one 12 oz bottle of beer (355 mL), one 5 oz glass of wine (148 mL), or one 1? oz glass of hard liquor (44 mL). ?Lifestyle ?Do not use any products that contain nicotine or tobacco. These products include cigarettes, chewing tobacco, and vaping devices, such as e-cigarettes. If you need help quitting, ask your health care provider. ?Do not use street drugs. ?Do not share needles. ?Ask your health care provider for help if you need support or information about quitting drugs. ?General instructions ?Schedule regular health, dental, and eye exams. ?Stay current with your vaccines. ?Tell your health care provider if: ?You often feel depressed. ?You have ever been abused or do not feel safe at  home. ?Summary ?Adopting a healthy lifestyle and getting preventive care are important in promoting health and wellness. ?Follow your health care provider's instructions about healthy diet, exercising, and getting tested or screened for diseases. ?Follow your health care provider's instructions on monitoring your cholesterol and blood pressure. ?This information is not intended to replace advice given to you by your health care provider. Make sure you discuss any questions you have with your health care provider. ?Document Revised: 11/25/2020 Document Reviewed: 11/25/2020 ?Elsevier Patient Education ? [place]. ? ?

## 2021-12-04 ENCOUNTER — Encounter: Payer: HMO | Admitting: Internal Medicine

## 2021-12-04 DIAGNOSIS — I10 Essential (primary) hypertension: Secondary | ICD-10-CM

## 2021-12-04 DIAGNOSIS — E119 Type 2 diabetes mellitus without complications: Secondary | ICD-10-CM

## 2021-12-04 DIAGNOSIS — I5032 Chronic diastolic (congestive) heart failure: Secondary | ICD-10-CM

## 2021-12-04 DIAGNOSIS — I2510 Atherosclerotic heart disease of native coronary artery without angina pectoris: Secondary | ICD-10-CM

## 2021-12-04 DIAGNOSIS — F3289 Other specified depressive episodes: Secondary | ICD-10-CM

## 2021-12-04 DIAGNOSIS — N184 Chronic kidney disease, stage 4 (severe): Secondary | ICD-10-CM

## 2021-12-04 DIAGNOSIS — E039 Hypothyroidism, unspecified: Secondary | ICD-10-CM

## 2021-12-04 DIAGNOSIS — I4819 Other persistent atrial fibrillation: Secondary | ICD-10-CM

## 2021-12-04 DIAGNOSIS — G479 Sleep disorder, unspecified: Secondary | ICD-10-CM

## 2021-12-04 DIAGNOSIS — G8929 Other chronic pain: Secondary | ICD-10-CM

## 2021-12-04 DIAGNOSIS — M8580 Other specified disorders of bone density and structure, unspecified site: Secondary | ICD-10-CM

## 2021-12-04 DIAGNOSIS — Z0000 Encounter for general adult medical examination without abnormal findings: Secondary | ICD-10-CM

## 2021-12-04 DIAGNOSIS — I69351 Hemiplegia and hemiparesis following cerebral infarction affecting right dominant side: Secondary | ICD-10-CM

## 2021-12-07 ENCOUNTER — Other Ambulatory Visit: Payer: Self-pay | Admitting: Internal Medicine

## 2021-12-08 ENCOUNTER — Observation Stay (HOSPITAL_COMMUNITY): Payer: HMO

## 2021-12-08 ENCOUNTER — Other Ambulatory Visit: Payer: Self-pay

## 2021-12-08 ENCOUNTER — Encounter (HOSPITAL_COMMUNITY): Payer: Self-pay

## 2021-12-08 ENCOUNTER — Emergency Department (HOSPITAL_COMMUNITY): Payer: HMO

## 2021-12-08 ENCOUNTER — Inpatient Hospital Stay (HOSPITAL_COMMUNITY)
Admission: EM | Admit: 2021-12-08 | Discharge: 2021-12-17 | DRG: 312 | Disposition: A | Discharge status: Skilled Nursing Facility | Payer: HMO | Attending: Internal Medicine | Admitting: Internal Medicine

## 2021-12-08 DIAGNOSIS — E872 Acidosis, unspecified: Secondary | ICD-10-CM | POA: Diagnosis present

## 2021-12-08 DIAGNOSIS — G8929 Other chronic pain: Secondary | ICD-10-CM | POA: Diagnosis present

## 2021-12-08 DIAGNOSIS — I739 Peripheral vascular disease, unspecified: Secondary | ICD-10-CM | POA: Diagnosis not present

## 2021-12-08 DIAGNOSIS — M2550 Pain in unspecified joint: Secondary | ICD-10-CM | POA: Diagnosis not present

## 2021-12-08 DIAGNOSIS — R600 Localized edema: Secondary | ICD-10-CM | POA: Diagnosis present

## 2021-12-08 DIAGNOSIS — R4020 Unspecified coma: Secondary | ICD-10-CM | POA: Diagnosis not present

## 2021-12-08 DIAGNOSIS — E7849 Other hyperlipidemia: Secondary | ICD-10-CM | POA: Diagnosis not present

## 2021-12-08 DIAGNOSIS — R609 Edema, unspecified: Secondary | ICD-10-CM | POA: Diagnosis not present

## 2021-12-08 DIAGNOSIS — Z9889 Other specified postprocedural states: Secondary | ICD-10-CM | POA: Diagnosis not present

## 2021-12-08 DIAGNOSIS — E1122 Type 2 diabetes mellitus with diabetic chronic kidney disease: Secondary | ICD-10-CM | POA: Diagnosis present

## 2021-12-08 DIAGNOSIS — D649 Anemia, unspecified: Secondary | ICD-10-CM

## 2021-12-08 DIAGNOSIS — R4182 Altered mental status, unspecified: Secondary | ICD-10-CM | POA: Diagnosis not present

## 2021-12-08 DIAGNOSIS — Z66 Do not resuscitate: Secondary | ICD-10-CM | POA: Diagnosis present

## 2021-12-08 DIAGNOSIS — E785 Hyperlipidemia, unspecified: Secondary | ICD-10-CM | POA: Diagnosis present

## 2021-12-08 DIAGNOSIS — R531 Weakness: Secondary | ICD-10-CM

## 2021-12-08 DIAGNOSIS — J9811 Atelectasis: Secondary | ICD-10-CM | POA: Diagnosis not present

## 2021-12-08 DIAGNOSIS — Z91013 Allergy to seafood: Secondary | ICD-10-CM

## 2021-12-08 DIAGNOSIS — I951 Orthostatic hypotension: Principal | ICD-10-CM | POA: Diagnosis present

## 2021-12-08 DIAGNOSIS — N179 Acute kidney failure, unspecified: Secondary | ICD-10-CM

## 2021-12-08 DIAGNOSIS — R471 Dysarthria and anarthria: Secondary | ICD-10-CM

## 2021-12-08 DIAGNOSIS — E8809 Other disorders of plasma-protein metabolism, not elsewhere classified: Secondary | ICD-10-CM | POA: Diagnosis present

## 2021-12-08 DIAGNOSIS — L98429 Non-pressure chronic ulcer of back with unspecified severity: Secondary | ICD-10-CM

## 2021-12-08 DIAGNOSIS — I4892 Unspecified atrial flutter: Secondary | ICD-10-CM | POA: Diagnosis not present

## 2021-12-08 DIAGNOSIS — F32A Depression, unspecified: Secondary | ICD-10-CM | POA: Diagnosis present

## 2021-12-08 DIAGNOSIS — Z79890 Hormone replacement therapy (postmenopausal): Secondary | ICD-10-CM

## 2021-12-08 DIAGNOSIS — Z79899 Other long term (current) drug therapy: Secondary | ICD-10-CM

## 2021-12-08 DIAGNOSIS — R42 Dizziness and giddiness: Secondary | ICD-10-CM | POA: Diagnosis not present

## 2021-12-08 DIAGNOSIS — D696 Thrombocytopenia, unspecified: Secondary | ICD-10-CM

## 2021-12-08 DIAGNOSIS — W010XXA Fall on same level from slipping, tripping and stumbling without subsequent striking against object, initial encounter: Secondary | ICD-10-CM | POA: Diagnosis present

## 2021-12-08 DIAGNOSIS — Z7901 Long term (current) use of anticoagulants: Secondary | ICD-10-CM

## 2021-12-08 DIAGNOSIS — R339 Retention of urine, unspecified: Secondary | ICD-10-CM | POA: Diagnosis not present

## 2021-12-08 DIAGNOSIS — R404 Transient alteration of awareness: Secondary | ICD-10-CM | POA: Diagnosis not present

## 2021-12-08 DIAGNOSIS — Z8673 Personal history of transient ischemic attack (TIA), and cerebral infarction without residual deficits: Secondary | ICD-10-CM

## 2021-12-08 DIAGNOSIS — I480 Paroxysmal atrial fibrillation: Secondary | ICD-10-CM | POA: Diagnosis present

## 2021-12-08 DIAGNOSIS — G9341 Metabolic encephalopathy: Secondary | ICD-10-CM

## 2021-12-08 DIAGNOSIS — Z515 Encounter for palliative care: Secondary | ICD-10-CM

## 2021-12-08 DIAGNOSIS — I447 Left bundle-branch block, unspecified: Secondary | ICD-10-CM | POA: Diagnosis not present

## 2021-12-08 DIAGNOSIS — R2990 Unspecified symptoms and signs involving the nervous system: Secondary | ICD-10-CM | POA: Diagnosis present

## 2021-12-08 DIAGNOSIS — I130 Hypertensive heart and chronic kidney disease with heart failure and stage 1 through stage 4 chronic kidney disease, or unspecified chronic kidney disease: Secondary | ICD-10-CM | POA: Diagnosis present

## 2021-12-08 DIAGNOSIS — I248 Other forms of acute ischemic heart disease: Secondary | ICD-10-CM | POA: Diagnosis not present

## 2021-12-08 DIAGNOSIS — N184 Chronic kidney disease, stage 4 (severe): Secondary | ICD-10-CM | POA: Diagnosis present

## 2021-12-08 DIAGNOSIS — Y92002 Bathroom of unspecified non-institutional (private) residence single-family (private) house as the place of occurrence of the external cause: Secondary | ICD-10-CM | POA: Diagnosis not present

## 2021-12-08 DIAGNOSIS — R29810 Facial weakness: Secondary | ICD-10-CM | POA: Diagnosis present

## 2021-12-08 DIAGNOSIS — Z888 Allergy status to other drugs, medicaments and biological substances status: Secondary | ICD-10-CM

## 2021-12-08 DIAGNOSIS — A419 Sepsis, unspecified organism: Secondary | ICD-10-CM | POA: Diagnosis present

## 2021-12-08 DIAGNOSIS — I4819 Other persistent atrial fibrillation: Secondary | ICD-10-CM | POA: Diagnosis present

## 2021-12-08 DIAGNOSIS — F3289 Other specified depressive episodes: Secondary | ICD-10-CM | POA: Diagnosis not present

## 2021-12-08 DIAGNOSIS — R0902 Hypoxemia: Secondary | ICD-10-CM | POA: Diagnosis not present

## 2021-12-08 DIAGNOSIS — R6520 Severe sepsis without septic shock: Secondary | ICD-10-CM | POA: Diagnosis not present

## 2021-12-08 DIAGNOSIS — E039 Hypothyroidism, unspecified: Secondary | ICD-10-CM | POA: Diagnosis present

## 2021-12-08 DIAGNOSIS — E860 Dehydration: Secondary | ICD-10-CM | POA: Diagnosis present

## 2021-12-08 DIAGNOSIS — I5032 Chronic diastolic (congestive) heart failure: Secondary | ICD-10-CM | POA: Diagnosis present

## 2021-12-08 DIAGNOSIS — Z91041 Radiographic dye allergy status: Secondary | ICD-10-CM

## 2021-12-08 DIAGNOSIS — L89159 Pressure ulcer of sacral region, unspecified stage: Secondary | ICD-10-CM | POA: Diagnosis not present

## 2021-12-08 DIAGNOSIS — R413 Other amnesia: Secondary | ICD-10-CM

## 2021-12-08 DIAGNOSIS — R5381 Other malaise: Secondary | ICD-10-CM | POA: Diagnosis present

## 2021-12-08 DIAGNOSIS — R55 Syncope and collapse: Secondary | ICD-10-CM | POA: Diagnosis present

## 2021-12-08 DIAGNOSIS — I351 Nonrheumatic aortic (valve) insufficiency: Secondary | ICD-10-CM | POA: Diagnosis present

## 2021-12-08 DIAGNOSIS — R4781 Slurred speech: Secondary | ICD-10-CM | POA: Diagnosis not present

## 2021-12-08 DIAGNOSIS — I959 Hypotension, unspecified: Secondary | ICD-10-CM | POA: Diagnosis present

## 2021-12-08 DIAGNOSIS — Z880 Allergy status to penicillin: Secondary | ICD-10-CM

## 2021-12-08 DIAGNOSIS — Z8249 Family history of ischemic heart disease and other diseases of the circulatory system: Secondary | ICD-10-CM

## 2021-12-08 DIAGNOSIS — R001 Bradycardia, unspecified: Secondary | ICD-10-CM | POA: Diagnosis present

## 2021-12-08 DIAGNOSIS — K5900 Constipation, unspecified: Secondary | ICD-10-CM | POA: Diagnosis not present

## 2021-12-08 DIAGNOSIS — G459 Transient cerebral ischemic attack, unspecified: Secondary | ICD-10-CM | POA: Diagnosis not present

## 2021-12-08 DIAGNOSIS — Z7401 Bed confinement status: Secondary | ICD-10-CM | POA: Diagnosis not present

## 2021-12-08 DIAGNOSIS — Z7189 Other specified counseling: Secondary | ICD-10-CM | POA: Diagnosis not present

## 2021-12-08 DIAGNOSIS — R6510 Systemic inflammatory response syndrome (SIRS) of non-infectious origin without acute organ dysfunction: Secondary | ICD-10-CM | POA: Diagnosis present

## 2021-12-08 DIAGNOSIS — R109 Unspecified abdominal pain: Secondary | ICD-10-CM | POA: Diagnosis not present

## 2021-12-08 DIAGNOSIS — R0602 Shortness of breath: Secondary | ICD-10-CM | POA: Diagnosis not present

## 2021-12-08 HISTORY — DX: Unspecified open wound of lower back and pelvis without penetration into retroperitoneum, initial encounter: S31.000A

## 2021-12-08 LAB — URINALYSIS, ROUTINE W REFLEX MICROSCOPIC
Bilirubin Urine: NEGATIVE
Glucose, UA: NEGATIVE mg/dL
Ketones, ur: NEGATIVE mg/dL
Nitrite: NEGATIVE
Protein, ur: NEGATIVE mg/dL
Specific Gravity, Urine: 1.011 (ref 1.005–1.030)
pH: 5 (ref 5.0–8.0)

## 2021-12-08 LAB — DIFFERENTIAL
Abs Immature Granulocytes: 0.08 10*3/uL — ABNORMAL HIGH (ref 0.00–0.07)
Basophils Absolute: 0 10*3/uL (ref 0.0–0.1)
Basophils Relative: 0 %
Eosinophils Absolute: 0.1 10*3/uL (ref 0.0–0.5)
Eosinophils Relative: 1 %
Immature Granulocytes: 1 %
Lymphocytes Relative: 17 %
Lymphs Abs: 1.9 10*3/uL (ref 0.7–4.0)
Monocytes Absolute: 0.7 10*3/uL (ref 0.1–1.0)
Monocytes Relative: 7 %
Neutro Abs: 7.9 10*3/uL — ABNORMAL HIGH (ref 1.7–7.7)
Neutrophils Relative %: 74 %

## 2021-12-08 LAB — COMPREHENSIVE METABOLIC PANEL
ALT: 26 U/L (ref 0–44)
AST: 25 U/L (ref 15–41)
Albumin: 2.6 g/dL — ABNORMAL LOW (ref 3.5–5.0)
Alkaline Phosphatase: 79 U/L (ref 38–126)
Anion gap: 13 (ref 5–15)
BUN: 67 mg/dL — ABNORMAL HIGH (ref 8–23)
CO2: 15 mmol/L — ABNORMAL LOW (ref 22–32)
Calcium: 9 mg/dL (ref 8.9–10.3)
Chloride: 109 mmol/L (ref 98–111)
Creatinine, Ser: 2.61 mg/dL — ABNORMAL HIGH (ref 0.44–1.00)
GFR, Estimated: 18 mL/min — ABNORMAL LOW (ref >60)
Glucose, Bld: 120 mg/dL — ABNORMAL HIGH (ref 70–99)
Potassium: 4.5 mmol/L (ref 3.5–5.1)
Sodium: 137 mmol/L (ref 135–145)
Total Bilirubin: 0.4 mg/dL (ref 0.3–1.2)
Total Protein: 5.8 g/dL — ABNORMAL LOW (ref 6.5–8.1)

## 2021-12-08 LAB — CBC
HCT: 35.8 % — ABNORMAL LOW (ref 36.0–46.0)
Hemoglobin: 11.4 g/dL — ABNORMAL LOW (ref 12.0–15.0)
MCH: 31.7 pg (ref 26.0–34.0)
MCHC: 31.8 g/dL (ref 30.0–36.0)
MCV: 99.4 fL (ref 80.0–100.0)
Platelets: 204 10*3/uL (ref 150–400)
RBC: 3.6 MIL/uL — ABNORMAL LOW (ref 3.87–5.11)
RDW: 15.9 % — ABNORMAL HIGH (ref 11.5–15.5)
WBC: 10.6 10*3/uL — ABNORMAL HIGH (ref 4.0–10.5)
nRBC: 0 % (ref 0.0–0.2)

## 2021-12-08 LAB — I-STAT CHEM 8, ED
BUN: 68 mg/dL — ABNORMAL HIGH (ref 8–23)
Calcium, Ion: 1.15 mmol/L (ref 1.15–1.40)
Chloride: 110 mmol/L (ref 98–111)
Creatinine, Ser: 2.7 mg/dL — ABNORMAL HIGH (ref 0.44–1.00)
Glucose, Bld: 117 mg/dL — ABNORMAL HIGH (ref 70–99)
HCT: 35 % — ABNORMAL LOW (ref 36.0–46.0)
Hemoglobin: 11.9 g/dL — ABNORMAL LOW (ref 12.0–15.0)
Potassium: 4.6 mmol/L (ref 3.5–5.1)
Sodium: 137 mmol/L (ref 135–145)
TCO2: 17 mmol/L — ABNORMAL LOW (ref 22–32)

## 2021-12-08 LAB — TSH: TSH: 6.828 u[IU]/mL — ABNORMAL HIGH (ref 0.350–4.500)

## 2021-12-08 LAB — LACTIC ACID, PLASMA: Lactic Acid, Venous: 1.7 mmol/L (ref 0.5–1.9)

## 2021-12-08 LAB — CBG MONITORING, ED: Glucose-Capillary: 119 mg/dL — ABNORMAL HIGH (ref 70–99)

## 2021-12-08 LAB — PROTIME-INR
INR: 1.4 — ABNORMAL HIGH (ref 0.8–1.2)
Prothrombin Time: 17.4 seconds — ABNORMAL HIGH (ref 11.4–15.2)

## 2021-12-08 LAB — APTT: aPTT: 30 seconds (ref 24–36)

## 2021-12-08 MED ORDER — APIXABAN 2.5 MG PO TABS
2.5000 mg | ORAL_TABLET | Freq: Two times a day (BID) | ORAL | Status: DC
  Administered 2021-12-09 – 2021-12-10 (×4): 2.5 mg via ORAL
  Filled 2021-12-08 (×4): qty 1

## 2021-12-08 MED ORDER — ACETAMINOPHEN 325 MG PO TABS
650.0000 mg | ORAL_TABLET | Freq: Four times a day (QID) | ORAL | Status: DC | PRN
  Administered 2021-12-13: 650 mg via ORAL
  Filled 2021-12-08: qty 2

## 2021-12-08 MED ORDER — SODIUM CHLORIDE 0.9 % IV SOLN
2.0000 g | Freq: Once | INTRAVENOUS | Status: AC
  Administered 2021-12-08: 2 g via INTRAVENOUS
  Filled 2021-12-08: qty 20

## 2021-12-08 MED ORDER — VANCOMYCIN HCL IN DEXTROSE 1-5 GM/200ML-% IV SOLN: 1000.0000 mg | INTRAVENOUS | Status: DC

## 2021-12-08 MED ORDER — VANCOMYCIN HCL IN DEXTROSE 1-5 GM/200ML-% IV SOLN: 1000.0000 mg | Freq: Once | INTRAVENOUS | Status: DC

## 2021-12-08 MED ORDER — STROKE: EARLY STAGES OF RECOVERY BOOK
Freq: Once | Status: AC
  Filled 2021-12-08: qty 1

## 2021-12-08 MED ORDER — LACTATED RINGERS IV BOLUS (SEPSIS)
250.0000 mL | Freq: Once | INTRAVENOUS | Status: AC
  Administered 2021-12-08: 250 mL via INTRAVENOUS

## 2021-12-08 MED ORDER — ATORVASTATIN CALCIUM 10 MG PO TABS
20.0000 mg | ORAL_TABLET | Freq: Every day | ORAL | Status: DC
  Administered 2021-12-09 – 2021-12-17 (×8): 20 mg via ORAL
  Filled 2021-12-08 (×8): qty 2

## 2021-12-08 MED ORDER — VANCOMYCIN HCL 1500 MG/300ML IV SOLN
1500.0000 mg | Freq: Once | INTRAVENOUS | Status: AC
  Administered 2021-12-08: 1500 mg via INTRAVENOUS
  Filled 2021-12-08: qty 300

## 2021-12-08 MED ORDER — LACTATED RINGERS IV BOLUS (SEPSIS)
1000.0000 mL | Freq: Once | INTRAVENOUS | Status: AC
  Administered 2021-12-08: 1000 mL via INTRAVENOUS

## 2021-12-08 MED ORDER — LACTATED RINGERS IV SOLN: INTRAVENOUS | Status: DC

## 2021-12-08 MED ORDER — SODIUM CHLORIDE 0.9% FLUSH
3.0000 mL | Freq: Once | INTRAVENOUS | Status: AC
  Administered 2021-12-08: 3 mL via INTRAVENOUS

## 2021-12-08 MED ORDER — SODIUM CHLORIDE 0.9 % IV BOLUS
500.0000 mL | Freq: Once | INTRAVENOUS | Status: AC
  Administered 2021-12-08: 500 mL via INTRAVENOUS

## 2021-12-08 MED ORDER — MUPIROCIN 2 % EX OINT
1.0000 "application " | TOPICAL_OINTMENT | Freq: Two times a day (BID) | CUTANEOUS | Status: DC
  Administered 2021-12-09 – 2021-12-17 (×18): 1 via TOPICAL
  Filled 2021-12-08 (×2): qty 22

## 2021-12-08 MED ORDER — LEVOTHYROXINE SODIUM 75 MCG PO TABS
75.0000 ug | ORAL_TABLET | Freq: Every day | ORAL | Status: DC
  Administered 2021-12-12 – 2021-12-17 (×6): 75 ug via ORAL
  Filled 2021-12-08 (×7): qty 1

## 2021-12-08 MED ORDER — ACETAMINOPHEN 650 MG RE SUPP: 650.0000 mg | Freq: Four times a day (QID) | RECTAL | Status: DC | PRN

## 2021-12-08 MED ORDER — SODIUM CHLORIDE 0.9 % IV SOLN
1.0000 g | INTRAVENOUS | Status: DC
  Administered 2021-12-09 – 2021-12-11 (×3): 1 g via INTRAVENOUS
  Filled 2021-12-08 (×3): qty 10

## 2021-12-08 NOTE — Assessment & Plan Note (Signed)
 #)   acquired hypothyroidism: documented h/o such, on Synthroid as outpatient.  In setting of bradycardia.  With syncope., will check TSH.  Plan: cont home Synthroid.  Check TSH/free T4, as above.

## 2021-12-08 NOTE — Assessment & Plan Note (Signed)
 #)   Generalized weakness: Patient reports 1 week of generalized weakness in the absence of any reported acute focal neurologic deficits, noting that her last weakness resulted in her ground-level mechanical fall in the bathroom earlier today.  Suspect that the nature of her generalized weakness is multifactorial in nature, with contributions dehydration given clinical evidence is including physical exam findings as well as evidence of acute prerenal azotemia consistent with this.  While no overt source of underlying function has been denied at this time, will engage in further infectious work-up, as further detailed below.  Plan: Continuous IV fluids, as above.  Monitor strict aforementioned telemetry telemetry sinus.  Check TSH, free T4, CPK, calcium, MMA.  Physical therapy/Occupational Therapy consults placed including.  Precautions ordered.

## 2021-12-08 NOTE — Consult Note (Signed)
Neurology Consultation  Reason for Consult: Code Stroke Referring Physician: Zavits  CC: Unresponsive  History is obtained from:Patient, EMS, Chart  HPI: Valerie Mcclure is a 81 y.o. female with a past medical history of atrial fibrillation on eliquis, bradycardia, CHF, HLD, and DM. She was found unresponsive on her bathroom floor with a heart rate of 30  on EMS arrival. As EMS was prepping to pace her, her heart rate improved to 60 and she became arousable. Her symptoms rapidly improved en route to the hospital.    LKW: 1445 TNK given?: no, on eliquis IR Thrombectomy? No, no LVO Modified Rankin Scale: 3-Moderate disability-requires help but walks WITHOUT assistance  ROS: A complete ROS was performed and is negative except as noted in the HPI.   Past Medical History:  Diagnosis Date   Blood transfusion without reported diagnosis    Chronic low back pain    History of echocardiogram    Echo 5/18: EF 55-60, Gr 2 DD, mild MR, mild LAE, PASP 47 // Echo 1/18:  EF 55, mild AI, MAC, mild MR, mild LAE, trivial pericardial effusion   History of ischemic left MCA stroke 11/2016   left MCA infarct status post mechanical thrombectomy complicated by large left basal ganglia hemorrhage with right shift.   HTN (hypertension)    Hyperlipidemia    Hypothyroidism    Osteopenia    Persistent atrial fibrillation ([name])    CHADS2-VASc=6 (female, age 76, HTN, CVA) // Apixaban // rate control strategy     Family History  Problem Relation Age of Onset   Heart disease Father 45       MI age 25s   Lung cancer Brother 67   Colon cancer Neg Hx    Esophageal cancer Neg Hx    Rectal cancer Neg Hx    Stomach cancer Neg Hx     Social History:   reports that she has never smoked. She has never used smokeless tobacco. She reports that she does not drink alcohol and does not use drugs.  Medications  Current Facility-Administered Medications:    lactated ringers bolus 1,000 mL, 1,000 mL,  Intravenous, Once **AND** lactated ringers bolus 1,000 mL, 1,000 mL, Intravenous, Once **AND** lactated ringers bolus 250 mL, 250 mL, Intravenous, Once, Elnora Morrison, MD   lactated ringers infusion, , Intravenous, Continuous, Elnora Morrison, MD   Derrill Memo ON 12/10/2021] vancomycin (VANCOCIN) IVPB 1000 mg/200 mL premix, 1,000 mg, Intravenous, Q48H, Elnora Morrison, MD   vancomycin (VANCOREADY) IVPB 1500 mg/300 mL, 1,500 mg, Intravenous, Once, Elnora Morrison, MD  Current Outpatient Medications:    acetaminophen (TYLENOL) 500 MG tablet, Take 1,000 mg by mouth every 8 (eight) hours as needed., Disp: , Rfl:    atorvastatin (LIPITOR) 20 MG tablet, TAKE 1 TABLET(20 MG) BY MOUTH DAILY, Disp: 90 tablet, Rfl: 3   benzonatate (TESSALON PERLES) 100 MG capsule, Take 1 capsule (100 mg total) by mouth 3 (three) times daily as needed for cough., Disp: 30 capsule, Rfl: 1   CALCIUM-VITAMIN D PO, Take by mouth., Disp: , Rfl:    ciclopirox (LOPROX) 0.77 % cream, APPLY TOPICALLY TO THE AFFECTED AREA TWICE DAILY, Disp: 90 g, Rfl: 2   clonazePAM (KLONOPIN) 0.5 MG tablet, Take 0.5 tablets (0.25 mg total) by mouth 2 (two) times daily as needed for anxiety., Disp: 30 tablet, Rfl: 0   ELIQUIS 2.5 MG TABS tablet, TAKE 1 TABLET(2.5 MG) BY MOUTH TWICE DAILY, Disp: 60 tablet, Rfl: 1   FLUoxetine (PROZAC) 40 MG capsule, TAKE  1 CAPSULE(40 MG) BY MOUTH DAILY, Disp: 90 capsule, Rfl: 0   hydrALAZINE (APRESOLINE) 25 MG tablet, TAKE 1 TABLET(25 MG) BY MOUTH THREE TIMES DAILY, Disp: 270 tablet, Rfl: 1   HYDROcodone bit-homatropine (HYCODAN) 5-1.5 MG/5ML syrup, Take 5 mLs by mouth every 8 (eight) hours as needed for cough., Disp: 120 mL, Rfl: 0   levothyroxine (SYNTHROID) 75 MCG tablet, TAKE 1 TABLET(75 MCG) BY MOUTH DAILY BEFORE BREAKFAST. FOLLOW-UP, Disp: 30 tablet, Rfl: 2   lisinopril (ZESTRIL) 2.5 MG tablet, Take 2.5 mg by mouth daily., Disp: , Rfl:    metoprolol tartrate (LOPRESSOR) 25 MG tablet, TAKE 1 TABLET(25 MG) BY MOUTH TWICE  DAILY, Disp: 180 tablet, Rfl: 1   mupirocin ointment (BACTROBAN) 2 %, Apply 1 application. topically 2 (two) times daily., Disp: 30 g, Rfl: 2   nortriptyline (PAMELOR) 10 MG capsule, TAKE 4 CAPSULES(40 MG) BY MOUTH AT BEDTIME, Disp: 360 capsule, Rfl: 0   torsemide (DEMADEX) 20 MG tablet, TAKE 1 TABLET(20 MG) BY MOUTH DAILY, Disp: 30 tablet, Rfl: 5   vitamin B-12 (CYANOCOBALAMIN) 100 MCG tablet, Take 100 mcg by mouth daily., Disp: , Rfl:   Exam: Current vital signs: BP (!) 119/54   Pulse (!) 58   Temp (!) 95 F (35 C) (Rectal)   Resp 14   Wt 73.4 kg   SpO2 100%   BMI 27.78 kg/m  Vital signs in last 24 hours: Temp:  [95 F (35 C)] 95 F (35 C) (05/22 1727) Pulse Rate:  [47-59] 58 (05/22 1815) Resp:  [14-22] 14 (05/22 1815) BP: (80-119)/(23-64) 119/54 (05/22 1815) SpO2:  [99 %-100 %] 100 % (05/22 1815) Weight:  [73.4 kg] 73.4 kg (05/22 1600)  GENERAL: Awake, alert, in no acute distress Psych: Affect appropriate for situation, patient is calm and cooperative with examination Head: Normocephalic and atraumatic, without obvious abnormality EENT: Normal conjunctivae, dry mucous membranes, no OP obstruction LUNGS: Normal respiratory effort. Non-labored breathing on room air CV: Regular rate and rhythm on telemetry ABDOMEN: Soft, non-tender, non-distended Extremities: warm, well perfused, without obvious deformity  NEURO:  Mental Status: Awake, alert and oriented x3, disoriented to situation She is able to provide a clear and coherent history of present illness. Speech/Language: speech is mildly dysarthric.   Naming, repetition, fluency, and comprehension intact without aphasia  No neglect is noted Cranial Nerves:  II: PERRL R 27m/brisk, L 387mbrisk. visual fields full.  III, IV, VI: EOMI. Lid elevation symmetric and full.  V: Sensation is intact to light touch and symmetrical to face. Blinks to threat. Moves jaw back and forth.  VII: Right facial droop VIII: Hearing intact to  voice IX, X: Palate elevation is symmetric. Phonation normal.  XI: Normal sternocleidomastoid and trapezius muscle strength XII: Tongue protrudes midline without fasciculations.   Motor: 5/5 strength is all muscle groups.  Tone is normal. Bulk is normal.  Sensation: Intact to light touch bilaterally in all four extremities. No extinction to DSS present.  Coordination: FTN intact bilaterally. HKS intact bilaterally. No pronator drift. Alternating hand movements.  DTRs: 2+ throughout.  Gait: Deferred  NIHSS: 1a Level of Conscious.: 0 1b LOC Questions: 2 1c LOC Commands: 0 2 Best Gaze: 0 3 Visual: 0 4 Facial Palsy: 1 5a Motor Arm - left: 0 5b Motor Arm - Right: 0 6a Motor Leg - Left: 0 6b Motor Leg - Right: 0 7 Limb Ataxia: 0 8 Sensory: 0 9 Best Language: 0 10 Dysarthria: 1 11 Extinct. and Inatten.: 0 TOTAL: 4  Labs I have reviewed labs in epic and the results pertinent to this consultation are:  CBC    Component Value Date/Time   WBC 10.6 (H) 12/08/2021 1628   RBC 3.60 (L) 12/08/2021 1628   HGB 11.9 (L) 12/08/2021 1629   HCT 35.0 (L) 12/08/2021 1629   PLT 204 12/08/2021 1628   MCV 99.4 12/08/2021 1628   MCH 31.7 12/08/2021 1628   MCHC 31.8 12/08/2021 1628   RDW 15.9 (H) 12/08/2021 1628   LYMPHSABS 1.9 12/08/2021 1628   MONOABS 0.7 12/08/2021 1628   EOSABS 0.1 12/08/2021 1628   BASOSABS 0.0 12/08/2021 1628    CMP     Component Value Date/Time   NA 137 12/08/2021 1629   NA 140 02/12/2017 1412   K 4.6 12/08/2021 1629   CL 110 12/08/2021 1629   CO2 15 (L) 12/08/2021 1628   GLUCOSE 117 (H) 12/08/2021 1629   BUN 68 (H) 12/08/2021 1629   BUN 16 02/12/2017 1412   CREATININE 2.70 (H) 12/08/2021 1629   CALCIUM 9.0 12/08/2021 1628   PROT 5.8 (L) 12/08/2021 1628   ALBUMIN 2.6 (L) 12/08/2021 1628   AST 25 12/08/2021 1628   ALT 26 12/08/2021 1628   ALKPHOS 79 12/08/2021 1628   BILITOT 0.4 12/08/2021 1628   GFRNONAA 18 (L) 12/08/2021 1628   GFRAA 42 (L)  07/13/2017 0531    Lipid Panel     Component Value Date/Time   CHOL 153 10/13/2021 1541   TRIG 244.0 (H) 10/13/2021 1541   HDL 52.30 10/13/2021 1541   CHOLHDL 3 10/13/2021 1541   VLDL 48.8 (H) 10/13/2021 1541   LDLCALC 51 06/10/2020 1331   LDLDIRECT 57.0 10/13/2021 1541     Imaging I have reviewed the images obtained:  CT-scan of the brain- Redemonstrated chronic left MCA territory infarct within the left temporal lobe, insula and basal ganglia. No acute infarct  MRI examination of the brain- pending  Assessment: 81 year old female with a past medical history of afib on eliquis, bradycardia, CHF, HLD, DM, CVA 4 years ago with residual deficits who was found unresponsive in her bathroom with a HR of 30 and her right arm contractured.   Recommendations: - HgbA1c <6.6, LDL <40 - MRI reviewed and showed no acute intracranial process. No evidence of acute or subacute infarct., MRA  of the brain reviewed which showed no intracranial large vessel occlusion or significant stenosis.  - Frequent neuro checks - Echocardiogram pending and if no PFO, wall motion abnormalities or LVT, no further neurologic recommendations at this time. - Resume eliquis  - Risk factor modification - Telemetry monitoring - PT consult, OT consult, Speech consult. - She may follow up with outpatient neurology. - Please call for any further questions.    Janine Ores, DNP, FNP-BC [facility] Pager: [phone]   To contact Stroke Continuity provider, please refer to [email or web address] After hours, contact General Neurology   Attending Attestation:  Patient seen, examined, labs,vitals and notes reviewed. Discussed plan with Janine Ores, NP and agree with assessment and plan as documented above. I have independently reviewed the chart, obtained history, review of systems and examined the patient. Patient stated deficits had been present since previous stroke and there were no new focal deficits.  She stated that she is compliant with her apixaban. EMS noted symptoms began while she was on the toilet and on EMS arrival, HR was in the low 30 and they could not feel a pulse on either arm.   Electronically  signed by:  Lynnae Sandhoff, MD Page: [phone] 12/08/2021, 7:20 PM

## 2021-12-08 NOTE — Assessment & Plan Note (Signed)
  #)   Paroxysmal atrial fibrillation: Documented history of such. In setting of CHA2DS2-VASc score of  9, there is an indication for chronic anticoagulation for thromboembolic prophylaxis. Consistent with this, patient is chronically anticoagulated on Eliquis. Home AV nodal blocking regimen: Metoprolol tartrate.  Most recent echocardiogram performed in May 2018, as further noted above. Presenting EKG rate controlled atrial fibrillation without overt evidence of acute ischemic changes.  Of note, given the patient's mild bradycardia, which appears chronic for her, will hold home beta-blocker overnight given her presenting episode of syncope.   Plan: monitor strict I's & O's and daily weights. Repeat BMP/CBC in AM. Check serum mag level.  Holding beta-blocker for now, as above.  Monitor on telemetry.  Continue on Eliquis.

## 2021-12-08 NOTE — Sepsis Progress Note (Signed)
[facility] is following code sepsis 

## 2021-12-08 NOTE — Assessment & Plan Note (Signed)
  #)   Bradycardia: Per patient/family, she has a history of chronic bradycardia dating back at least the last 4 years, with reported baseline heart rates in the 50s to 60s.  Notable in the setting of her history of atrial fibrillation, but without chart review revealing any documentation of concern for sick sinus syndrome.  She is on Lopressor as her sole outpatient blocking agent.  In the ED, she appears to be rate controlled atrial fibrillation, with heart rates in the mid 50s to mid 60s, without evidence of acute ischemic changes.  Given this history, it appears that her bradycardia leading to her syncopal episode today, but may have served in a secondary role, diminishing compensatory tachycardia for ensuing episode of orthostatic hypotension complicated by dehydration.  Plan: Check TSH/free T4.  Monitor on symmetry.  Add on serum magnesium level.  Ionized calcium level.  Holding home beta-blocker for now.

## 2021-12-08 NOTE — Assessment & Plan Note (Signed)
#)   Hyperlipidemia: documented h/o such. On atorvastatin as outpatient.  ?  ?  ?Plan: continue home statin.  ?  ?

## 2021-12-08 NOTE — Assessment & Plan Note (Signed)
 #)   Hypotension: Initial hypotension with blood pressures in the 80s over 50s, which is subsequent.  In 2 normotensive range following IV fluids, as further quantified above.  Appears consistent with previously described suspicion for underlying dehydration given patient reported recent decline in oral intake as well as laboratory evidence of acute prerenal azotemia.   Plan: Continuous IV fluids, as above.  Monitor strict I's and O's and daily weights.  Monitor on symmetry.  Follow-up result of echocardiogram has been ordered for tomorrow morning.  Check urinalysis.

## 2021-12-08 NOTE — ED Notes (Signed)
Daughter [name] would like to be contacted or Colie Josten [phone]  Melanie req that Gustine POWELL-not be contacted or given any information.

## 2021-12-08 NOTE — ED Provider Notes (Addendum)
[facility] EMERGENCY DEPARTMENT Provider Note   CSN: [id number] Arrival date & time: 12/08/21  1617  An emergency department physician performed an initial assessment on this suspected stroke patient at 32.  History  No chief complaint on file.   Valerie Mcclure is a 81 y.o. female.  Patient presents as code stroke with EMS.  Patient had been found by daughter lying on the floor in the bathroom and on EMS arrival had right facial droop and right arm contraction with heart rate in 30s.  Last known normal around 3:00 this afternoon.  Patient reports thinking she passed out earlier today.  Decreased oral intake today and general weakness.  Patient has a history of stroke.  Patient is on Eliquis, blood pressure meds and thyroid meds.      Home Medications Prior to Admission medications   Medication Sig Start Date End Date Taking? Authorizing Provider  acetaminophen (TYLENOL) 500 MG tablet Take 1,000 mg by mouth every 8 (eight) hours as needed.    [provider]  atorvastatin (LIPITOR) 20 MG tablet TAKE 1 TABLET(20 MG) BY MOUTH DAILY 04/10/21   Burns, Claudina Lick, MD  benzonatate (TESSALON PERLES) 100 MG capsule Take 1 capsule (100 mg total) by mouth 3 (three) times daily as needed for cough. 11/06/21   [name], MD  CALCIUM-VITAMIN D PO Take by mouth.    [provider]  ciclopirox (LOPROX) 0.77 % cream APPLY TOPICALLY TO THE AFFECTED AREA TWICE DAILY 12/08/21   [name], MD  clonazePAM (KLONOPIN) 0.5 MG tablet Take 0.5 tablets (0.25 mg total) by mouth 2 (two) times daily as needed for anxiety. 08/19/21   Burns, Claudina Lick, MD  ELIQUIS 2.5 MG TABS tablet TAKE 1 TABLET(2.5 MG) BY MOUTH TWICE DAILY 11/12/21   [name], MD  FLUoxetine (PROZAC) 40 MG capsule TAKE 1 CAPSULE(40 MG) BY MOUTH DAILY 09/08/21   [name], MD  hydrALAZINE (APRESOLINE) 25 MG tablet TAKE 1 TABLET(25 MG) BY MOUTH THREE TIMES DAILY 07/30/21   [name], MD   HYDROcodone bit-homatropine (HYCODAN) 5-1.5 MG/5ML syrup Take 5 mLs by mouth every 8 (eight) hours as needed for cough. 11/12/21   [name], MD  levothyroxine (SYNTHROID) 75 MCG tablet TAKE 1 TABLET(75 MCG) BY MOUTH DAILY BEFORE BREAKFAST. FOLLOW-UP 09/15/21   [name], MD  lisinopril (ZESTRIL) 2.5 MG tablet Take 2.5 mg by mouth daily. 08/19/20   [provider]  metoprolol tartrate (LOPRESSOR) 25 MG tablet TAKE 1 TABLET(25 MG) BY MOUTH TWICE DAILY 12/01/21   [name], MD  mupirocin ointment (BACTROBAN) 2 % Apply 1 application. topically 2 (two) times daily. 10/13/21   [name], MD  nortriptyline (PAMELOR) 10 MG capsule TAKE 4 CAPSULES(40 MG) BY MOUTH AT BEDTIME 10/09/21   [name], MD  torsemide (DEMADEX) 20 MG tablet TAKE 1 TABLET(20 MG) BY MOUTH DAILY 09/08/21   [name], MD  vitamin B-12 (CYANOCOBALAMIN) 100 MCG tablet Take 100 mcg by mouth daily.    [provider]      Allergies    Shrimp [shellfish allergy], Valsartan, Tandem plus [fefum-fepo-fa-b cmp-c-zn-mn-cu], Ivp dye [iodinated contrast media], and Penicillins    Review of Systems   Review of Systems  Unable to perform ROS: Acuity of condition   Physical Exam Updated Vital Signs BP (!) 118/56   Pulse 62   Temp (!) 95 F (35 C) (Rectal)   Resp 19  Wt 73.4 kg   SpO2 100%   BMI 27.78 kg/m  Physical Exam Vitals and nursing note reviewed.  Constitutional:      General: She is not in acute distress.    Appearance: She is well-developed.  HENT:     Head: Normocephalic and atraumatic.     Comments: Dry mm    Mouth/Throat:     Mouth: Mucous membranes are moist.  Eyes:     General:        Right eye: No discharge.        Left eye: No discharge.     Conjunctiva/sclera: Conjunctivae normal.  Neck:     Trachea: No tracheal deviation.  Cardiovascular:     Rate and Rhythm: Regular rhythm. Bradycardia present.  Pulmonary:     Effort: Pulmonary effort is normal.     Breath  sounds: Normal breath sounds.  Abdominal:     General: There is no distension.     Palpations: Abdomen is soft.     Tenderness: There is no abdominal tenderness. There is no guarding.  Musculoskeletal:     Cervical back: Normal range of motion and neck supple. No rigidity.  Skin:    General: Skin is warm.     Capillary Refill: Capillary refill takes less than 2 seconds.     Findings: No rash.     Comments: Patient has lower sacral wound with mild drainage and erythema  Neurological:     General: No focal deficit present.     Mental Status: She is alert.     GCS: GCS eye subscore is 4. GCS verbal subscore is 5. GCS motor subscore is 6.     Comments: General weakness on exam, patient can hold both arms up equally and legs with subtle weakness bilateral.  Finger-nose intact however more difficult on the right arm.  Extraocular muscle function intact pupils equal bilateral.  Neck supple.  Psychiatric:     Comments: Tired appearing, minimal dysarthria    ED Results / Procedures / Treatments   Labs (all labs ordered are listed, but only abnormal results are displayed) Labs Reviewed  PROTIME-INR - Abnormal; Notable for the following components:      Result Value   Prothrombin Time 17.4 (*)    INR 1.4 (*)    All other components within normal limits  CBC - Abnormal; Notable for the following components:   WBC 10.6 (*)    RBC 3.60 (*)    Hemoglobin 11.4 (*)    HCT 35.8 (*)    RDW 15.9 (*)    All other components within normal limits  DIFFERENTIAL - Abnormal; Notable for the following components:   Neutro Abs 7.9 (*)    Abs Immature Granulocytes 0.08 (*)    All other components within normal limits  COMPREHENSIVE METABOLIC PANEL - Abnormal; Notable for the following components:   CO2 15 (*)    Glucose, Bld 120 (*)    BUN 67 (*)    Creatinine, Ser 2.61 (*)    Total Protein 5.8 (*)    Albumin 2.6 (*)    GFR, Estimated 18 (*)    All other components within normal limits  TSH -  Abnormal; Notable for the following components:   TSH 6.828 (*)    All other components within normal limits  I-STAT CHEM 8, ED - Abnormal; Notable for the following components:   BUN 68 (*)    Creatinine, Ser 2.70 (*)    Glucose, Bld 117 (*)  TCO2 17 (*)    Hemoglobin 11.9 (*)    HCT 35.0 (*)    All other components within normal limits  CBG MONITORING, ED - Abnormal; Notable for the following components:   Glucose-Capillary 119 (*)    All other components within normal limits  CULTURE, BLOOD (SINGLE)  CULTURE, BLOOD (SINGLE)  APTT  URINALYSIS, ROUTINE W REFLEX MICROSCOPIC  LACTIC ACID, PLASMA  LACTIC ACID, PLASMA    EKG None  Radiology DG Chest Portable 1 View  Result Date: 12/08/2021 CLINICAL DATA:  Sepsis EXAM: PORTABLE CHEST 1 VIEW COMPARISON:  11/12/2021 FINDINGS: Transverse diameter of heart is in the upper limits of normal. There are no signs of pulmonary edema or focal pulmonary consolidation. There is interval clearing of small linear densities at the left cardiophrenic angle. There is no significant pleural effusion or pneumothorax. IMPRESSION: No active disease. Electronically Signed   By: Elmer Picker M.D.   On: 12/08/2021 18:09   CT HEAD CODE STROKE WO CONTRAST  Result Date: 12/08/2021 CLINICAL DATA:  Code stroke. Neuro deficit, acute, stroke suspected. Right-sided facial droop, weakness, slurred speech. EXAM: CT HEAD WITHOUT CONTRAST TECHNIQUE: Contiguous axial images were obtained from the base of the skull through the vertex without intravenous contrast. RADIATION DOSE REDUCTION: This exam was performed according to the departmental dose-optimization program which includes automated exposure control, adjustment of the mA and/or kV according to patient size and/or use of iterative reconstruction technique. COMPARISON:  Brain MRI 07/08/2019.  Head CT 07/12/2017. FINDINGS: Brain: No age advanced or lobar predominant parenchymal atrophy. Redemonstrated chronic  left MCA territory infarct within the left temporal lobe, left insula and left basal ganglia. Chronic small vessel ischemic changes within the cerebral white matter, better appreciated on the prior brain MRI of 07/08/2019. There is no acute intracranial hemorrhage. No acute demarcated cortical infarct. No extra-axial fluid collection. No evidence of an intracranial mass. No midline shift. Vascular: No hyperdense vessel.  Atherosclerotic calcifications. Skull: Normal. Negative for fracture or focal lesion. Sinuses/Orbits: No mass or acute finding within the imaged orbits. Minimal frothy secretions within the left maxillary sinus at the imaged levels. ASPECTS [facility] Stroke Program Early CT Score) - Ganglionic level infarction (caudate, lentiform nuclei, internal capsule, insula, M1-M3 cortex): 7 - Supraganglionic infarction (M4-M6 cortex): 3 Total score (0-10 with 10 being normal): 10 (when discounting chronic infarcts). No acute intracranial findings. These results were communicated to Dr. Theda Sers at 4:40 pmon 5/22/2023by text page via the [facility] messaging system. IMPRESSION: No evidence of acute intracranial abnormality. Redemonstrated chronic left MCA territory infarct within the left temporal lobe, insula and basal ganglia. Chronic small-vessel ischemic changes within the pons, better appreciated on the prior brain MRI of 07/08/2019. Mild left maxillary sinusitis. Electronically Signed   By: Kellie Simmering D.O.   On: 12/08/2021 16:42    Procedures .Critical Care Performed by: Elnora Morrison, MD Authorized by: Elnora Morrison, MD   Critical care provider statement:    Critical care time (minutes):  80   Critical care start time:  12/08/2021 5:05 PM   Critical care end time:  12/08/2021 6:25 PM   Critical care time was exclusive of:  Separately billable procedures and treating other patients and teaching time   Critical care was necessary to treat or prevent imminent or life-threatening deterioration of the  following conditions:  Sepsis   Critical care was time spent personally by me on the following activities:  Discussions with consultants, evaluation of patient's response to treatment, examination of patient, pulse  oximetry, re-evaluation of patient's condition, ordering and review of radiographic studies, ordering and review of laboratory studies and ordering and performing treatments and interventions Ultrasound ED Peripheral IV (Provider)  Date/Time: 12/08/2021 7:54 PM Performed by: Elnora Morrison, MD Authorized by: Elnora Morrison, MD   Procedure details:    Indications: multiple failed IV attempts     Skin Prep: chlorhexidine gluconate     Location:  Right AC   Angiocath:  18 G   Bedside Ultrasound Guided: Yes     Images: archived     Patient tolerated procedure without complications: Yes     Dressing applied: Yes      Medications Ordered in ED Medications  lactated ringers infusion (has no administration in time range)  lactated ringers bolus 1,000 mL (1,000 mLs Intravenous New Bag/Given 12/08/21 1844)    And  lactated ringers bolus 1,000 mL (1,000 mLs Intravenous New Bag/Given 12/08/21 1843)    And  lactated ringers bolus 250 mL (has no administration in time range)  vancomycin (VANCOREADY) IVPB 1500 mg/300 mL (1,500 mg Intravenous New Bag/Given 12/08/21 1842)  vancomycin (VANCOCIN) IVPB 1000 mg/200 mL premix (has no administration in time range)  sodium chloride flush (NS) 0.9 % injection 3 mL (3 mLs Intravenous Given 12/08/21 1734)  sodium chloride 0.9 % bolus 500 mL (0 mLs Intravenous Stopped 12/08/21 1844)  cefTRIAXone (ROCEPHIN) 2 g in sodium chloride 0.9 % 100 mL IVPB (0 g Intravenous Stopped 12/08/21 1844)    ED Course/ Medical Decision Making/ A&P                           Medical Decision Making Amount and/or Complexity of Data Reviewed Labs: ordered. Radiology: ordered.  Risk Prescription drug management. Decision regarding hospitalization.   Patient presents  as code stroke given recent onset of change in mental status, dysarthria and reported facial droop.  On my examination after neurology patient had general weakness, no focal findings, mild dysarthria.  Patient not a candidate for tPA given on anticoagulation.  Broad differential including metabolic, infectious, stroke/CNS, other.  General blood work ordered reviewed showing acute renal failure likely consistent with dehydration given dry mucous membranes and decreased p.o. intake today.  Patient's rectal temp 95 degrees, patient also has leukocytosis and open skin wound is likely source other source include urine which is pending and blood cultures.  With hypotension on repeat blood pressure code sepsis initiated.  Blood culture sent, lactic acid pending, IV fluids and IV antibiotics broad-spectrum ordered.  Patient blood pressure and clinically improved on reassessment blood pressure 474 systolic.  Lactic acids added.  IV fluid boluses given and IV antibiotics.  Difficult IV ultrasound-guided by myself right AC.  Discussed with hospitalist for admission.  Daughter [name] would like to be contacted or Mashelle Busick [phone]        Final Clinical Impression(s) / ED Diagnoses Final diagnoses:  Dysarthria  General weakness  Syncope and collapse  Skin ulcer of sacrum, unspecified ulcer stage ([place])  Acute renal failure, unspecified acute renal failure type [facility])    Rx / DC Orders ED Discharge Orders     None         Elnora Morrison, MD 12/08/21 4332    Elnora Morrison, MD 12/08/21 1954

## 2021-12-08 NOTE — Assessment & Plan Note (Signed)
    #)   Chronic diastolic heart failure: documented history of such, with most recent echocardiogram performed in May 2018, notable for LVEF 55 to 60% as well as grade 2 diastolic dysfunction. No clinical or radiographic evidence to suggest acutely decompensated heart failure at this time. home diuretic regimen reportedly consists of the following: Torsemide 20 mg p.o. daily.  Patient appears mildly dehydrated at presentation, will hold home torsemide for now, close monitoring for ensuing evidence of   Plan: monitor strict I's & O's and daily weights. Repeat BMP in AM. Check serum mag level.  Hold home torsemide for now, as above.

## 2021-12-08 NOTE — Progress Notes (Signed)
Pharmacy Antibiotic Note  Valerie Mcclure is a 81 y.o. female admitted on 12/08/2021 with cellulitis.  Pharmacy has been consulted for vancomycin dosing.  Ceftriaxone 2g x1 in the ED. Scr 2.7 on admission, baseline appears to be ~1.8.   WBC 10.6, afebrile, leg wound  Plan: Vancomycin 1500 mg x1 then 1000 mg q48h F/u cultures, ability to de-escalate F/u renal function and levels as appropraite  Weight: 73.4 kg (161 lb 13.1 oz)  Temp (24hrs), Avg:95 F (35 C), Min:95 F (35 C), Max:95 F (35 C)  Recent Labs  Lab 12/08/21 1628 12/08/21 1629  WBC 10.6*  --   CREATININE 2.61* 2.70*    Estimated Creatinine Clearance: 15.8 mL/min (A) (by C-G formula based on SCr of 2.7 mg/dL (H)).    Allergies  Allergen Reactions   Shrimp [Shellfish Allergy] Anaphylaxis and Rash    Break outs and swelling of the throat   Valsartan Other (See Comments)    Renal failure   Tandem Plus [Fefum-Fepo-Fa-B Cmp-C-Zn-Mn-Cu] Nausea And Vomiting   Ivp Dye [Iodinated Contrast Media] Rash    itching   Penicillins Itching and Rash    Has patient had a PCN reaction causing immediate rash, facial/tongue/throat swelling, SOB or lightheadedness with hypotension: Yes Has patient had a PCN reaction causing severe rash involving mucus membranes or skin necrosis: No Has patient had a PCN reaction that required hospitalization: No Has patient had a PCN reaction occurring within the last 10 years: No If all of the above answers are "NO", then may proceed with Cephalosporin use.     Antimicrobials this admission: 5/22 vancomycin >> (5/29) 5/22 ceftriaxone  Dose adjustments this admission: N/A  Microbiology results: 5/22 BCx: sent  Thank you for allowing pharmacy to be a part of this patient's care.  Eduard Clos [name] 12/08/2021 5:44 PM

## 2021-12-08 NOTE — ED Triage Notes (Signed)
Pt bib [facility] from home where pt was found unresponsive by daughter and was laying on the floor in the bathroom. Upon EMS arrival, pt was found with right facial droop and right arm contraction with a heart rate in the 30s. CODE stroke called prior to arrival. Pt arrives AOx3.

## 2021-12-08 NOTE — Assessment & Plan Note (Signed)
  #)   Acute kidney injury superimposed on stage IV CKD: In setting of baseline creatinine range of 1.7-2.0, presenting creatinine found to be 2.61, associated with acute prerenal azotemia, consistent with suspected underlying dehydration, as above.  Urinalysis with microscopy currently pending.  Potential pharmacologic exacerbation in the setting of her lisinopril.  Also, potential contribution from renal hypoperfusion as a consequence of initial systemic hypotension, now resolved following IV fluids.   Plan: Follow-up result urinalysis with microscopy.  Add on random sodium as well as random urine creatinine.  Check CPK.  IV fluids as above.  Repeat CMP in the morning.  Holding home lisinopril for now.

## 2021-12-08 NOTE — Code Documentation (Signed)
Stroke Response Nurse Documentation Code Documentation  Valerie Mcclure is a 81 y.o. female arriving to [facility]  via [facility] on 12-08-21 with past medical hx of Afib, bradycardia,CHF, hyperlipidemia, DM, ICH, . On Eliquis (apixaban) daily. Code stroke was activated by EMS.   Patient from home where she was LKW at 1445 and now complaining of Right weakness. Per EMS she was unresponsive in the bathroom with right arm curled up and right facial droop.  En route to the hospital she became responsive and symptoms rapidly improved.  Stroke team at the bedside on patient arrival. Labs drawn and patient cleared for CT by Dr. Francia Greaves. Patient to CT with team. NIHSS 4, see documentation for details and code stroke times. Patient with disoriented, right facial droop, and dysarthria  on exam. The following imaging was completed:  CT Head. Patient is not a candidate for IV Thrombolytic due to regularly taking Eliquis.  She took her Eliquis this am. Patient is not a candidate for IR due to no LVO suspected.   Care Plan: mNIHSS and VS @ 2 hours.   Bedside handoff with ED RN Esmond Plants.    Raliegh Ip  Stroke Response RN

## 2021-12-08 NOTE — Assessment & Plan Note (Signed)
 #)   syncope: A single episode of loss of consciousness that occurred as the patient was attempting to stand up after prolonged period In which she was supine, and associated with prodrome, with duration of episode loss of consciousness lasting a few seconds, without any ensuing postictal confusion, no associated evidence of bowel/bladder function loss, tongue biting, or tonic-clonic activity.  Given the positional nature of this single episode of syncope along with associated prodrome, presentation appears most consistent with orthostatic hypotension, in the setting of dehydration, further exacerbated by diminish compensatory tachycardia/Vanstory peripheral vasoconstriction due to outpatient medications include Lopressor and lisinopril as well as a drowsy, all superimposed on the patient's reported baseline sinus bradycardia.  Additionally, neurology consulted in the ED, recommending TIA/stroke work-up, including pursuit of MRI MRA brain, carotid Dopplers.  We will pursue this additional imaging and work-up as recommended by neurology.  ACS appears less likely in the absence of any associated chest pain, while presenting EKG nonspecific acute ischemic changes.  Additionally, acute PE less likely given patient's reported compliance on chronic anticoagulation via Eliquis.  No evidence of seizures.   Plan: Check orthostatic vital signs, will noting that the patient has not received IV fluids in the emergency department, potentially altering the results of this study.  Continuous IV fluids.  Echocardiogram in the morning.  Add on serum magnesium level.  Per neurology recommendation, MRI, MRA brain, bilateral carotid Doppler.  Monitor on telemetry.  Repeat CMP and CBC in the morning.

## 2021-12-08 NOTE — Sepsis Progress Note (Signed)
Notified bedside nurse of need to order lactic acid. 

## 2021-12-08 NOTE — H&P (Signed)
History and Physical    PLEASE NOTE THAT DRAGON DICTATION SOFTWARE WAS USED IN THE CONSTRUCTION OF THIS NOTE.   Valerie Mcclure JJH:[id number] DOB: [birth year] DOA: 12/08/2021  PCP: [name], MD  Patient coming from: home   I have personally briefly reviewed patient's old medical records in [place]  Chief Complaint: syncope  HPI: Valerie Mcclure is a 81 y.o. female with medical history significant for persistent atrial fibrillation chronically anticoagulated on Eliquis, chronic diastolic heart failure hypertension, hyperlipidemia, chronic sacral wound, acquired hypothyroidism, stage IV CKD with baseline creatinine 1.7-2.0, who is admitted to [facility] on 12/08/2021 with syncope after presenting from home via EMS to [facility] ED complaining of a single episode of loss of consciousness.   The following history is provided by the patient as well as my discussions with the patient's daughter and son-in-law, who are present at bedside, in addition to my discussions with the EDP and via chart review.  On the afternoon of 12/08/2021, the patient reports that she was ambulating into her bathroom at home, when she tripped, falling to the bathroom floor, without any initial loss of consciousness and without striking her head.  However, in the setting of generalized weakness, which she states that she has been experiencing over the course of the preceding week, she was unable to extricate herself from the floor.  She was able to call for her daughter, who upon arriving at the patient's side confirmed that the patient was fully conscious, alert, oriented, but daughter was not able to lift the patient from the floor by herself, prompting her to contact EMS.  EMS reportedly found the patient to initially be fully conscious, but upon attempting to lift the patient from the floor the patient experienced an episode of loss of consciousness while moving from a supine/seated position to  standing.  In the seconds before actually losing consciousness, the patient reports experiencing dizziness, lightheadedness, and a subjective sensation of ensuing loss of consciousness, before actually losing consciousness.  As EMS arrived her side, they were able to catch her, before she completely fell to the floor, and there is no reported associated trauma with this episode of syncope.  The patient reportedly regained consciousness within a few seconds, and this episode is not associated with any tongue biting, loss of bowel/bladder function or any associated tonic-clonic activity.  At the time of this episode of loss of consciousness, EMS felt the patient's heart rate was in the 30s, although upon recheck found heart rate to be in the 60s.   This episode was not associated with any acute focal weakness, acute focal numbness, paresthesias, facial droop, slurred speech, expressive aphasia, acute change in vision, dysphagia, vertigo.  She also denies any recent associated, or ensuing chest pain, palpitations shortness of breath, nausea, vomiting.  Denies any recent worsening of edema in the bilateral lower extremities, nor any recent calf tenderness or new lower extremity erythema.   Either way over the last 2 to 3 days, she has noted some subjective fever, but in the absence of any associated chills, rigors, or generalized myalgias.  She conveys that she was treated, approximately a month ago for an respiratory infection, noting that she completed a course of doxycycline followed by prednisone, with the course of these medications terminating a few weeks ago.  She conveys subsequent complete resolution of the mild respiratory symptoms, including cough, that she was experiencing leading up to their initiation.  Denies any recent dysuria or gross  hematuria.  No headache, neck stiffness, rash, abdominal pain, diarrhea.  She also notes a chronic sacral wound, without any noted recent worsening thereof.  She has  chronic low back pain, without any recent exacerbation.  In setting of her recent generalized weakness, the patient acknowledges that she has been consuming less water over the last 3 to 4 days.  History notable for persistent atrial fibrillation for which she is chronically anticoagulated on Eliquis.  She notes a history of bradycardia, which is also found to be documented, starting 4 years ago.  Patient did not feel like it from that the patient's baseline heart rate is in the 50s to low 70s, with average heart rates in the low 60s.  Denies any recent modifications to her home Lopressor dose.  Most recent echocardiogram occurred in May 2018 which was notable for LVEF 55 to 60%, no focal motion rise, grade 2 diastolic dysfunction, trivial aortic regurgitation, mild mitral regurgitation, normal right ventricular systolic function.  In the setting of chronic diastolic heart failure, she is on torsemide 20 mg p.o. daily.  She also has a history of essential pretension, with additional hypertensive medications notable for lisinopril and hydralazine.  Medical history also notable for stage IV CKD, with baseline creatinine 1.7-2.0, with most recent prior serum creatinine data point noted to be 1.87 on 10/13/2021.      ED Course:  Vital signs in the ED were notable for the following: Initial temperature 95, heart rate 55-63, initial blood pressure 80/58, which improved to 118/56 following interval IV fluids, as further detailed below; respiratory rate 16-22, oxygen saturation 99 to 100% on room air.  Labs were notable for the following: CMP notable for the following: Creatinine 2.61, BUN/creatinine ratio 25.7, glucose 120, calcium, corrected for mild hypoalbuminemia noted to be 10.2, noted to be 6, liver enzymes within normal limits otherwise.  CBC notable for white cell count 10,600, hemoglobin 11.4 associated with normocytic/normochromic findings relative to most recent prior value of 12.3 on 10/13/2021.   Urinalysis ordered, with result currently pending.  Lactate pending.  Blood cultures x2 were collected prior to initiation of IV antibiotics.  Imaging and additional notable ED work-up: EKG showed atrial fibrillation with heart rate 60, left lower branch block, nonspecific T wave inversion in leads I and aVL, and no evidence of ST changes, including no evidence of ST elevation.  This is relative to most recent prior EKG which was notable for heart rate 73.  Chest x-ray showed no evidence of acute cardiopulmonary process.  ct head showed no evidence of acute intracranial process.  EDP discussed the patient's case with on-call neurology, Dr. Theda Sers, Recommended TIA/stroke work-up, including MRI brain, MRA brain without contrast, echocardiogram, carotid Dopplers bilaterally, resumption of home Eliquis.   While in the ED, the following were administered: Rocephin, vancomycin, lactated Ringer's x2250 cc bolus, normal saline x500 cc bolus, followed by the continuous lactated Ringer's at 150 cc/h.  Subsequently, the patient was admitted for observation for further evaluation and management of syncope, with presentation also notable for generalized weakness, dehydration, acute kidney injury superimposed on stage IV CKD.    Review of Systems: As per HPI otherwise 10 point review of systems negative.   Past Medical History:  Diagnosis Date   Blood transfusion without reported diagnosis    Chronic low back pain    History of echocardiogram    Echo 5/18: EF 55-60, Gr 2 DD, mild MR, mild LAE, PASP 47 // Echo 1/18:  EF 55, mild  AI, MAC, mild MR, mild LAE, trivial pericardial effusion   History of ischemic left MCA stroke 11/2016   left MCA infarct status post mechanical thrombectomy complicated by large left basal ganglia hemorrhage with right shift.   HTN (hypertension)    Hyperlipidemia    Hypothyroidism    Osteopenia    Persistent atrial fibrillation ([name])    CHADS2-VASc=6 (female, age 1, HTN, CVA)  // Apixaban // rate control strategy    Sacral wound    chronic sacral wound    Past Surgical History:  Procedure Laterality Date   ABDOMINAL HYSTERECTOMY  1970   CHOLECYSTECTOMY  07/2009   Dr. Rise Patience   COLONOSCOPY  2003   FLEXIBLE SIGMOIDOSCOPY  2010   HAND SURGERY     IR ANGIO INTRA EXTRACRAN SEL COM CAROTID INNOMINATE UNI L MOD SED  11/29/2016   IR ANGIO VERTEBRAL SEL SUBCLAVIAN INNOMINATE UNI R MOD SED  11/29/2016   IR PERCUTANEOUS ART THROMBECTOMY/INFUSION INTRACRANIAL INC DIAG ANGIO  11/29/2016   IR RADIOLOGIST EVAL & MGMT  03/24/2017   LUMBAR LAMINECTOMY  11/2008   Done by Dr. Patrice Paradise   RADIOLOGY WITH ANESTHESIA N/A 11/29/2016   Procedure: RADIOLOGY WITH ANESTHESIA;  Surgeon: Radiologist, Medication, MD;  Location: [facility];  Service: Radiology;  Laterality: N/A;   THYROIDECTOMY      Social History:  reports that she has never smoked. She has never used smokeless tobacco. She reports that she does not drink alcohol and does not use drugs.   Allergies  Allergen Reactions   Shrimp [Shellfish Allergy] Anaphylaxis and Rash    Break outs and swelling of the throat   Valsartan Other (See Comments)    Renal failure   Tandem Plus [Fefum-Fepo-Fa-B Cmp-C-Zn-Mn-Cu] Nausea And Vomiting   Ivp Dye [Iodinated Contrast Media] Rash    itching   Penicillins Itching and Rash    Has patient had a PCN reaction causing immediate rash, facial/tongue/throat swelling, SOB or lightheadedness with hypotension: Yes Has patient had a PCN reaction causing severe rash involving mucus membranes or skin necrosis: No Has patient had a PCN reaction that required hospitalization: No Has patient had a PCN reaction occurring within the last 10 years: No If all of the above answers are "NO", then may proceed with Cephalosporin use.     Family History  Problem Relation Age of Onset   Heart disease Father 27       MI age 15s   Lung cancer Brother 30   Colon cancer Neg Hx    Esophageal cancer Neg Hx     Rectal cancer Neg Hx    Stomach cancer Neg Hx     Family history reviewed and not pertinent    Prior to Admission medications   Medication Sig Start Date End Date Taking? Authorizing Provider  acetaminophen (TYLENOL) 500 MG tablet Take 1,000 mg by mouth every 8 (eight) hours as needed.    [provider]  atorvastatin (LIPITOR) 20 MG tablet TAKE 1 TABLET(20 MG) BY MOUTH DAILY 04/10/21   Burns, Claudina Lick, MD  benzonatate (TESSALON PERLES) 100 MG capsule Take 1 capsule (100 mg total) by mouth 3 (three) times daily as needed for cough. 11/06/21   [name], MD  CALCIUM-VITAMIN D PO Take by mouth.    [provider]  ciclopirox (LOPROX) 0.77 % cream APPLY TOPICALLY TO THE AFFECTED AREA TWICE DAILY 12/08/21   [name], MD  clonazePAM (KLONOPIN) 0.5 MG tablet Take 0.5 tablets (0.25 mg total) by  mouth 2 (two) times daily as needed for anxiety. 08/19/21   Burns, Claudina Lick, MD  ELIQUIS 2.5 MG TABS tablet TAKE 1 TABLET(2.5 MG) BY MOUTH TWICE DAILY 11/12/21   [name], MD  FLUoxetine (PROZAC) 40 MG capsule TAKE 1 CAPSULE(40 MG) BY MOUTH DAILY 09/08/21   [name], MD  hydrALAZINE (APRESOLINE) 25 MG tablet TAKE 1 TABLET(25 MG) BY MOUTH THREE TIMES DAILY 07/30/21   [name], MD  HYDROcodone bit-homatropine (HYCODAN) 5-1.5 MG/5ML syrup Take 5 mLs by mouth every 8 (eight) hours as needed for cough. 11/12/21   [name], MD  levothyroxine (SYNTHROID) 75 MCG tablet TAKE 1 TABLET(75 MCG) BY MOUTH DAILY BEFORE BREAKFAST. FOLLOW-UP 09/15/21   [name], MD  lisinopril (ZESTRIL) 2.5 MG tablet Take 2.5 mg by mouth daily. 08/19/20   [provider]  metoprolol tartrate (LOPRESSOR) 25 MG tablet TAKE 1 TABLET(25 MG) BY MOUTH TWICE DAILY 12/01/21   [name], MD  mupirocin ointment (BACTROBAN) 2 % Apply 1 application. topically 2 (two) times daily. 10/13/21   [name], MD  nortriptyline (PAMELOR) 10 MG capsule TAKE 4 CAPSULES(40 MG) BY MOUTH AT BEDTIME 10/09/21    [name], MD  torsemide (DEMADEX) 20 MG tablet TAKE 1 TABLET(20 MG) BY MOUTH DAILY 09/08/21   [name], MD  vitamin B-12 (CYANOCOBALAMIN) 100 MCG tablet Take 100 mcg by mouth daily.    [provider]     Objective    Physical Exam: Vitals:   12/08/21 1815 12/08/21 1830 12/08/21 1845 12/08/21 1900  BP: (!) 119/54 100/82 (!) 102/58 (!) 118/56  Pulse: (!) 58 61 60 62  Resp: 14 (!) _0 Temp:      TempSrc:      SpO2: 100% 100% 100% 100%  Weight:        General: appears to be stated age; alert, oriented Skin: warm, dry, no rash Head:  AT/[place] Mouth:  Oral mucosa membranes appear dry, normal dentition Neck: supple; trachea midline Heart:  RRR; did not appreciate any M/R/G Lungs: CTAB, did not appreciate any wheezes, rales, or rhonchi Abdomen: + BS; soft, ND, NT Vascular: 2+ pedal pulses b/l; 2+ radial pulses b/l Extremities: no peripheral edema, no muscle wasting Neuro: Neuro: 5/5 strength of the proximal and distal flexors and extensors of the upper and lower extremities bilaterally; sensation intact in upper and lower extremities b/l; cranial nerves II through XII grossly intact; no pronator drift; no evidence suggestive of slurred speech, dysarthria, or facial droop; Normal muscle tone. No tremors.    Labs on Admission: I have personally reviewed following labs and imaging studies  CBC: Recent Labs  Lab 12/08/21 1628 12/08/21 1629  WBC 10.6*  --   NEUTROABS 7.9*  --   HGB 11.4* 11.9*  HCT 35.8* 35.0*  MCV 99.4  --   PLT 204  --    Basic Metabolic Panel: Recent Labs  Lab 12/08/21 1628 12/08/21 1629  NA 137 137  K 4.5 4.6  CL 109 110  CO2 15*  --   GLUCOSE 120* 117*  BUN 67* 68*  CREATININE 2.61* 2.70*  CALCIUM 9.0  --    GFR: Estimated Creatinine Clearance: 15.8 mL/min (A) (by C-G formula based on SCr of 2.7 mg/dL (H)). Liver Function Tests: Recent Labs  Lab 12/08/21 1628  AST 25  ALT 26  ALKPHOS 79  BILITOT 0.4  PROT 5.8*   ALBUMIN 2.6*   No results  for input(s): LIPASE, AMYLASE in the last 168 hours. No results for input(s): AMMONIA in the last 168 hours. Coagulation Profile: Recent Labs  Lab 12/08/21 1628  INR 1.4*   Cardiac Enzymes: No results for input(s): CKTOTAL, CKMB, CKMBINDEX, TROPONINI in the last 168 hours. BNP (last 3 results) No results for input(s): PROBNP in the last 8760 hours. HbA1C: No results for input(s): HGBA1C in the last 72 hours. CBG: Recent Labs  Lab 12/08/21 1622  GLUCAP 119*   Lipid Profile: No results for input(s): CHOL, HDL, LDLCALC, TRIG, CHOLHDL, LDLDIRECT in the last 72 hours. Thyroid Function Tests: Recent Labs    12/08/21 1808  TSH 6.828*   Anemia Panel: No results for input(s): VITAMINB12, FOLATE, FERRITIN, TIBC, IRON, RETICCTPCT in the last 72 hours. Urine analysis:    Component Value Date/Time   COLORURINE YELLOW 12/08/2021 1959   APPEARANCEUR HAZY (A) 12/08/2021 1959   LABSPEC 1.011 12/08/2021 1959   PHURINE 5.0 12/08/2021 1959   GLUCOSEU NEGATIVE 12/08/2021 1959   GLUCOSEU NEGATIVE 11/18/2010 0859   HGBUR MODERATE (A) 12/08/2021 1959   HGBUR negative 12/13/2008 [place] 12/08/2021 1959   KETONESUR NEGATIVE 12/08/2021 1959   PROTEINUR NEGATIVE 12/08/2021 1959   UROBILINOGEN 0.2 11/18/2010 0859   NITRITE NEGATIVE 12/08/2021 1959   LEUKOCYTESUR SMALL (A) 12/08/2021 1959    Radiological Exams on Admission: DG Chest Portable 1 View  Result Date: 12/08/2021 CLINICAL DATA:  Sepsis EXAM: PORTABLE CHEST 1 VIEW COMPARISON:  11/12/2021 FINDINGS: Transverse diameter of heart is in the upper limits of normal. There are no signs of pulmonary edema or focal pulmonary consolidation. There is interval clearing of small linear densities at the left cardiophrenic angle. There is no significant pleural effusion or pneumothorax. IMPRESSION: No active disease. Electronically Signed   By: Elmer Picker M.D.   On: 12/08/2021 18:09   CT  HEAD CODE STROKE WO CONTRAST  Result Date: 12/08/2021 CLINICAL DATA:  Code stroke. Neuro deficit, acute, stroke suspected. Right-sided facial droop, weakness, slurred speech. EXAM: CT HEAD WITHOUT CONTRAST TECHNIQUE: Contiguous axial images were obtained from the base of the skull through the vertex without intravenous contrast. RADIATION DOSE REDUCTION: This exam was performed according to the departmental dose-optimization program which includes automated exposure control, adjustment of the mA and/or kV according to patient size and/or use of iterative reconstruction technique. COMPARISON:  Brain MRI 07/08/2019.  Head CT 07/12/2017. FINDINGS: Brain: No age advanced or lobar predominant parenchymal atrophy. Redemonstrated chronic left MCA territory infarct within the left temporal lobe, left insula and left basal ganglia. Chronic small vessel ischemic changes within the cerebral white matter, better appreciated on the prior brain MRI of 07/08/2019. There is no acute intracranial hemorrhage. No acute demarcated cortical infarct. No extra-axial fluid collection. No evidence of an intracranial mass. No midline shift. Vascular: No hyperdense vessel.  Atherosclerotic calcifications. Skull: Normal. Negative for fracture or focal lesion. Sinuses/Orbits: No mass or acute finding within the imaged orbits. Minimal frothy secretions within the left maxillary sinus at the imaged levels. ASPECTS [facility] Stroke Program Early CT Score) - Ganglionic level infarction (caudate, lentiform nuclei, internal capsule, insula, M1-M3 cortex): 7 - Supraganglionic infarction (M4-M6 cortex): 3 Total score (0-10 with 10 being normal): 10 (when discounting chronic infarcts). No acute intracranial findings. These results were communicated to Dr. Theda Sers at 4:40 pmon 5/22/2023by text page via the [facility] messaging system. IMPRESSION: No evidence of acute intracranial abnormality. Redemonstrated chronic left MCA territory infarct within the left  temporal lobe, insula  and basal ganglia. Chronic small-vessel ischemic changes within the pons, better appreciated on the prior brain MRI of 07/08/2019. Mild left maxillary sinusitis. Electronically Signed   By: Kellie Simmering D.O.   On: 12/08/2021 16:42     EKG: Independently reviewed, with result as described above.    Assessment/Plan   Principal Problem:   Syncope Active Problems:   Hypothyroidism   Hyperlipidemia   Chronic diastolic heart failure ([name])   Acute renal failure superimposed on stage 4 chronic kidney disease ([name])   Bradycardia   Generalized weakness   Severe sepsis ([name])   Hypotension   Paroxysmal atrial fibrillation ([place])      #) syncope: A single episode of loss of consciousness that occurred as the patient was attempting to stand up after prolonged period In which she was supine, and associated with prodrome, with duration of episode loss of consciousness lasting a few seconds, without any ensuing postictal confusion, no associated evidence of bowel/bladder function loss, tongue biting, or tonic-clonic activity.  Given the positional nature of this single episode of syncope along with associated prodrome, presentation appears most consistent with orthostatic hypotension, in the setting of dehydration, further exacerbated by diminish compensatory tachycardia/Van[place] peripheral vasoconstriction due to outpatient medications include Lopressor and lisinopril as well as a drowsy, all superimposed on the patient's reported baseline sinus bradycardia.  Additionally, neurology consulted in the ED, recommending TIA/stroke work-up, including pursuit of MRI MRA brain, carotid Dopplers.  We will pursue this additional imaging and work-up as recommended by neurology.  ACS appears less likely in the absence of any associated chest pain, while presenting EKG nonspecific acute ischemic changes.  Additionally, acute PE less likely given patient's reported compliance on chronic  anticoagulation via Eliquis.  No evidence of seizures.   Plan: Check orthostatic vital signs, will noting that the patient has not received IV fluids in the emergency department, potentially altering the results of this study.  Continuous IV fluids.  Echocardiogram in the morning.  Add on serum magnesium level.  Per neurology recommendation, MRI, MRA brain, bilateral carotid Doppler.  Monitor on telemetry.  Repeat CMP and CBC in the morning.        #) Generalized weakness: Patient reports 1 week of generalized weakness in the absence of any reported acute focal neurologic deficits, noting that her last weakness resulted in her ground-level mechanical fall in the bathroom earlier today.  Suspect that the nature of her generalized weakness is multifactorial in nature, with contributions dehydration given clinical evidence is including physical exam findings as well as evidence of acute prerenal azotemia consistent with this.  While no overt source of underlying function has been denied at this time, will engage in further infectious work-up, as further detailed below.  Plan: Continuous IV fluids, as above.  Monitor strict aforementioned telemetry telemetry sinus.  Check TSH, free T4, CPK, calcium, MMA.  Physical therapy/Occupational Therapy consults placed including.  Precautions ordered.        #) Bradycardia: Per patient/family, she has a history of chronic bradycardia dating back at least the last 4 years, with reported baseline heart rates in the 50s to 60s.  Notable in the setting of her history of atrial fibrillation, but without chart review revealing any documentation of concern for sick sinus syndrome.  She is on Lopressor as her sole outpatient blocking agent.  In the ED, she appears to be rate controlled atrial fibrillation, with heart rates in the mid 50s to mid 60s, without evidence of acute ischemic changes.  Given this history, it appears that her bradycardia leading to her syncopal  episode today, but may have served in a secondary role, diminishing compensatory tachycardia for ensuing episode of orthostatic hypotension complicated by dehydration.  Plan: Check TSH/free T4.  Monitor on symmetry.  Add on serum magnesium level.  Ionized calcium level.  Holding home beta-blocker for now.       #) Hypotension: Initial hypotension with blood pressures in the 80s over 50s, which is subsequent.  In 2 normotensive range following IV fluids, as further quantified above.  Appears consistent with previously described suspicion for underlying dehydration given patient reported recent decline in oral intake as well as laboratory evidence of acute prerenal azotemia.   Plan: Continuous IV fluids, as above.  Monitor strict I's and O's and daily weights.  Monitor on symmetry.  Follow-up result of echocardiogram has been ordered for tomorrow morning.  Check urinalysis.        #) Acute kidney injury superimposed on stage IV CKD: In setting of baseline creatinine range of 1.7-2.0, presenting creatinine found to be 2.61, associated with acute prerenal azotemia, consistent with suspected underlying dehydration, as above.  Urinalysis with microscopy currently pending.  Potential pharmacologic exacerbation in the setting of her lisinopril.  Also, potential contribution from renal hypoperfusion as a consequence of initial systemic hypotension, now resolved following IV fluids.   Plan: Follow-up result urinalysis with microscopy.  Add on random sodium as well as random urine creatinine.  Check CPK.  IV fluids as above.  Repeat CMP in the morning.  Holding home lisinopril for now.       #) Severe sepsis: Criteria technically met for this diagnosis in the setting of the patient's report of 3 to 4 days of subjective fever with vital signs notable for hypothermia as well as mild tachypnea.  Borderline elevation in in my blood cell count at 10,600.  Underlying infectious process is possible,  although a specific source is not identified at this time, including chest x-ray which shows no evidence of acute process, including no evidence of infiltrate.  Waiting for result of urinalysis.  It appears that the patient also has history of chronic sacral wound, which she states appears no worse than normal, without overt evidence of corresponding surrounding cellulitic changes. However, given her presenting hypotension along with reported new onset subjective fever for the last 3 to 4 days, will continue the broad-spectrum IV antibiotics initiated in ED, will pursue additional infectious work-up, as further detailed below.  Of note, criteria for patient's sepsis to be considered severe in nature is met on the basis of evidence of endorgan damage in the form of AKI on CKD 4.  Stat lactic acid ordered, with result currently pending.  Plan: Continuous IV fluids, as above.  Monitor results stat lactic acid.  Continue IV vancomycin and Rocephin, as above.  Monitor for results of blood cultures x2.  Check urinalysis.  Repeat CBC with differential in the morning.  Add on procalcitonin level.           #) Chronic diastolic heart failure: documented history of such, with most recent echocardiogram performed in May 2018, notable for LVEF 55 to 60% as well as grade 2 diastolic dysfunction. No clinical or radiographic evidence to suggest acutely decompensated heart failure at this time. home diuretic regimen reportedly consists of the following: Torsemide 20 mg p.o. daily.  Patient appears mildly dehydrated at presentation, will hold home torsemide for now, close monitoring for ensuing evidence of  Plan: monitor strict I's & O's and daily weights. Repeat BMP in AM. Check serum mag level.  Hold home torsemide for now, as above.        #) Paroxysmal atrial fibrillation: Documented history of such. In setting of CHA2DS2-VASc score of  9, there is an indication for chronic anticoagulation for  thromboembolic prophylaxis. Consistent with this, patient is chronically anticoagulated on Eliquis. Home AV nodal blocking regimen: Metoprolol tartrate.  Most recent echocardiogram performed in May 2018, as further noted above. Presenting EKG rate controlled atrial fibrillation without overt evidence of acute ischemic changes.  Of note, given the patient's mild bradycardia, which appears chronic for her, will hold home beta-blocker overnight given her presenting episode of syncope.   Plan: monitor strict I's & O's and daily weights. Repeat BMP/CBC in AM. Check serum mag level.  Holding beta-blocker for now, as above.  Monitor on telemetry.  Continue on Eliquis.          #) h/o Essential Hypertension: documented h/o such, with outpatient antihypertensive regimen including Lopressor, lisinopril, hydralazine.  Initial hypotension, subsequent proving with IV fluids, as above.  However, given his initial hypotension, chronic bradycardia, syncope, and acute kidney injury, will hold all of home antihypertensive medications for now.  Plan: Close monitoring of subsequent BP via routine VS. holding home blood pressure medications for now.            #) Hyperlipidemia: documented h/o such. On atorvastatin as outpatient.    Plan: continue home statin.            #) acquired hypothyroidism: documented h/o such, on Synthroid as outpatient.  In setting of bradycardia.  With syncope., will check TSH.  Plan: cont home Synthroid.  Check TSH/free T4, as above.      DVT prophylaxis: SCD's   Code Status: Full code Family Communication: I discussed the patient's case with her daughter as well as her son-in-law, with whom are present at bedside. Disposition Plan: Per Rounding Team Consults called: Neurology consulted, as detailed above;  Admission status: Observation   PLEASE NOTE THAT DRAGON DICTATION SOFTWARE WAS USED IN THE CONSTRUCTION OF THIS NOTE.   [place]  DO [facility]  From [place]   12/08/2021, 8:31 PM

## 2021-12-08 NOTE — Assessment & Plan Note (Signed)
  #)   Severe sepsis: Criteria technically met for this diagnosis in the setting of the patient's report of 3 to 4 days of subjective fever with vital signs notable for hypothermia as well as mild tachypnea.  Borderline elevation in in my blood cell count at 10,600.  Underlying infectious process is possible, although a specific source is not identified at this time, including chest x-ray which shows no evidence of acute process, including no evidence of infiltrate.  Waiting for result of urinalysis.  It appears that the patient also has history of chronic sacral wound, which she states appears no worse than normal, without overt evidence of corresponding surrounding cellulitic changes. However, given her presenting hypotension along with reported new onset subjective fever for the last 3 to 4 days, will continue the broad-spectrum IV antibiotics initiated in ED, will pursue additional infectious work-up, as further detailed below.  Of note, criteria for patient's sepsis to be considered severe in nature is met on the basis of evidence of endorgan damage in the form of AKI on CKD 4.  Stat lactic acid ordered, with result currently pending.  Plan: Continuous IV fluids, as above.  Monitor results stat lactic acid.  Continue IV vancomycin and Rocephin, as above.  Monitor for results of blood cultures x2.  Check urinalysis.  Repeat CBC with differential in the morning.  Add on procalcitonin level.

## 2021-12-09 ENCOUNTER — Observation Stay (HOSPITAL_COMMUNITY): Payer: HMO

## 2021-12-09 DIAGNOSIS — I5032 Chronic diastolic (congestive) heart failure: Secondary | ICD-10-CM | POA: Diagnosis present

## 2021-12-09 DIAGNOSIS — E872 Acidosis, unspecified: Secondary | ICD-10-CM | POA: Diagnosis present

## 2021-12-09 DIAGNOSIS — R0602 Shortness of breath: Secondary | ICD-10-CM | POA: Diagnosis not present

## 2021-12-09 DIAGNOSIS — I248 Other forms of acute ischemic heart disease: Secondary | ICD-10-CM | POA: Diagnosis not present

## 2021-12-09 DIAGNOSIS — W010XXA Fall on same level from slipping, tripping and stumbling without subsequent striking against object, initial encounter: Secondary | ICD-10-CM | POA: Diagnosis present

## 2021-12-09 DIAGNOSIS — N184 Chronic kidney disease, stage 4 (severe): Secondary | ICD-10-CM | POA: Diagnosis present

## 2021-12-09 DIAGNOSIS — I4819 Other persistent atrial fibrillation: Secondary | ICD-10-CM | POA: Diagnosis present

## 2021-12-09 DIAGNOSIS — Y92002 Bathroom of unspecified non-institutional (private) residence single-family (private) house as the place of occurrence of the external cause: Secondary | ICD-10-CM | POA: Diagnosis not present

## 2021-12-09 DIAGNOSIS — R55 Syncope and collapse: Secondary | ICD-10-CM

## 2021-12-09 DIAGNOSIS — R001 Bradycardia, unspecified: Secondary | ICD-10-CM | POA: Diagnosis present

## 2021-12-09 DIAGNOSIS — Z515 Encounter for palliative care: Secondary | ICD-10-CM | POA: Diagnosis not present

## 2021-12-09 DIAGNOSIS — Z7189 Other specified counseling: Secondary | ICD-10-CM | POA: Diagnosis not present

## 2021-12-09 DIAGNOSIS — G459 Transient cerebral ischemic attack, unspecified: Secondary | ICD-10-CM

## 2021-12-09 DIAGNOSIS — I130 Hypertensive heart and chronic kidney disease with heart failure and stage 1 through stage 4 chronic kidney disease, or unspecified chronic kidney disease: Secondary | ICD-10-CM | POA: Diagnosis present

## 2021-12-09 DIAGNOSIS — I951 Orthostatic hypotension: Secondary | ICD-10-CM | POA: Diagnosis present

## 2021-12-09 DIAGNOSIS — R6510 Systemic inflammatory response syndrome (SIRS) of non-infectious origin without acute organ dysfunction: Secondary | ICD-10-CM | POA: Diagnosis present

## 2021-12-09 DIAGNOSIS — E785 Hyperlipidemia, unspecified: Secondary | ICD-10-CM | POA: Diagnosis present

## 2021-12-09 DIAGNOSIS — R413 Other amnesia: Secondary | ICD-10-CM | POA: Diagnosis not present

## 2021-12-09 DIAGNOSIS — R4182 Altered mental status, unspecified: Secondary | ICD-10-CM | POA: Diagnosis not present

## 2021-12-09 DIAGNOSIS — N179 Acute kidney failure, unspecified: Secondary | ICD-10-CM | POA: Diagnosis present

## 2021-12-09 DIAGNOSIS — K5900 Constipation, unspecified: Secondary | ICD-10-CM | POA: Diagnosis not present

## 2021-12-09 DIAGNOSIS — I739 Peripheral vascular disease, unspecified: Secondary | ICD-10-CM | POA: Diagnosis not present

## 2021-12-09 DIAGNOSIS — F3289 Other specified depressive episodes: Secondary | ICD-10-CM | POA: Diagnosis not present

## 2021-12-09 DIAGNOSIS — R609 Edema, unspecified: Secondary | ICD-10-CM | POA: Diagnosis not present

## 2021-12-09 DIAGNOSIS — I351 Nonrheumatic aortic (valve) insufficiency: Secondary | ICD-10-CM | POA: Diagnosis present

## 2021-12-09 DIAGNOSIS — E8809 Other disorders of plasma-protein metabolism, not elsewhere classified: Secondary | ICD-10-CM | POA: Diagnosis present

## 2021-12-09 DIAGNOSIS — E7849 Other hyperlipidemia: Secondary | ICD-10-CM | POA: Diagnosis not present

## 2021-12-09 DIAGNOSIS — Z66 Do not resuscitate: Secondary | ICD-10-CM | POA: Diagnosis present

## 2021-12-09 DIAGNOSIS — E1122 Type 2 diabetes mellitus with diabetic chronic kidney disease: Secondary | ICD-10-CM | POA: Diagnosis present

## 2021-12-09 DIAGNOSIS — R109 Unspecified abdominal pain: Secondary | ICD-10-CM | POA: Diagnosis not present

## 2021-12-09 DIAGNOSIS — G9341 Metabolic encephalopathy: Secondary | ICD-10-CM | POA: Diagnosis present

## 2021-12-09 DIAGNOSIS — E860 Dehydration: Secondary | ICD-10-CM | POA: Diagnosis present

## 2021-12-09 DIAGNOSIS — E039 Hypothyroidism, unspecified: Secondary | ICD-10-CM | POA: Diagnosis present

## 2021-12-09 DIAGNOSIS — F32A Depression, unspecified: Secondary | ICD-10-CM | POA: Diagnosis present

## 2021-12-09 DIAGNOSIS — Z9889 Other specified postprocedural states: Secondary | ICD-10-CM | POA: Diagnosis not present

## 2021-12-09 DIAGNOSIS — R531 Weakness: Secondary | ICD-10-CM | POA: Diagnosis not present

## 2021-12-09 DIAGNOSIS — D649 Anemia, unspecified: Secondary | ICD-10-CM | POA: Diagnosis not present

## 2021-12-09 DIAGNOSIS — I4892 Unspecified atrial flutter: Secondary | ICD-10-CM | POA: Diagnosis not present

## 2021-12-09 DIAGNOSIS — R2990 Unspecified symptoms and signs involving the nervous system: Secondary | ICD-10-CM | POA: Diagnosis present

## 2021-12-09 DIAGNOSIS — R600 Localized edema: Secondary | ICD-10-CM | POA: Diagnosis not present

## 2021-12-09 DIAGNOSIS — J9811 Atelectasis: Secondary | ICD-10-CM | POA: Diagnosis not present

## 2021-12-09 DIAGNOSIS — R339 Retention of urine, unspecified: Secondary | ICD-10-CM | POA: Diagnosis not present

## 2021-12-09 DIAGNOSIS — R471 Dysarthria and anarthria: Secondary | ICD-10-CM | POA: Diagnosis not present

## 2021-12-09 LAB — COMPREHENSIVE METABOLIC PANEL
ALT: 24 U/L (ref 0–44)
AST: 24 U/L (ref 15–41)
Albumin: 2.2 g/dL — ABNORMAL LOW (ref 3.5–5.0)
Alkaline Phosphatase: 67 U/L (ref 38–126)
Anion gap: 10 (ref 5–15)
BUN: 55 mg/dL — ABNORMAL HIGH (ref 8–23)
CO2: 15 mmol/L — ABNORMAL LOW (ref 22–32)
Calcium: 8.3 mg/dL — ABNORMAL LOW (ref 8.9–10.3)
Chloride: 114 mmol/L — ABNORMAL HIGH (ref 98–111)
Creatinine, Ser: 2.16 mg/dL — ABNORMAL HIGH (ref 0.44–1.00)
GFR, Estimated: 22 mL/min — ABNORMAL LOW (ref >60)
Glucose, Bld: 92 mg/dL (ref 70–99)
Potassium: 4.2 mmol/L (ref 3.5–5.1)
Sodium: 139 mmol/L (ref 135–145)
Total Bilirubin: 0.5 mg/dL (ref 0.3–1.2)
Total Protein: 5 g/dL — ABNORMAL LOW (ref 6.5–8.1)

## 2021-12-09 LAB — ECHOCARDIOGRAM COMPLETE
AR max vel: 1.57 cm2
AV Area VTI: 1.66 cm2
AV Area mean vel: 1.68 cm2
AV Mean grad: 5.5 mmHg
AV Peak grad: 12 mmHg
Ao pk vel: 1.73 m/s
Calc EF: 71.7 %
Height: 66 in
MV M vel: 3.87 m/s
MV Peak grad: 59.9 mmHg
P 1/2 time: 460 msec
S' Lateral: 2.7 cm
Single Plane A2C EF: 82.4 %
Single Plane A4C EF: 60.4 %
Weight: 2684.32 oz

## 2021-12-09 LAB — CBC WITH DIFFERENTIAL/PLATELET
Abs Immature Granulocytes: 0.06 10*3/uL (ref 0.00–0.07)
Basophils Absolute: 0 10*3/uL (ref 0.0–0.1)
Basophils Relative: 0 %
Eosinophils Absolute: 0.1 10*3/uL (ref 0.0–0.5)
Eosinophils Relative: 1 %
HCT: 32.2 % — ABNORMAL LOW (ref 36.0–46.0)
Hemoglobin: 10.1 g/dL — ABNORMAL LOW (ref 12.0–15.0)
Immature Granulocytes: 1 %
Lymphocytes Relative: 14 %
Lymphs Abs: 1.6 10*3/uL (ref 0.7–4.0)
MCH: 30.9 pg (ref 26.0–34.0)
MCHC: 31.4 g/dL (ref 30.0–36.0)
MCV: 98.5 fL (ref 80.0–100.0)
Monocytes Absolute: 0.8 10*3/uL (ref 0.1–1.0)
Monocytes Relative: 7 %
Neutro Abs: 8.7 10*3/uL — ABNORMAL HIGH (ref 1.7–7.7)
Neutrophils Relative %: 77 %
Platelets: 166 10*3/uL (ref 150–400)
RBC: 3.27 MIL/uL — ABNORMAL LOW (ref 3.87–5.11)
RDW: 15.9 % — ABNORMAL HIGH (ref 11.5–15.5)
WBC: 11.3 10*3/uL — ABNORMAL HIGH (ref 4.0–10.5)
nRBC: 0 % (ref 0.0–0.2)

## 2021-12-09 LAB — IRON AND TIBC
Iron: 45 ug/dL (ref 28–170)
Saturation Ratios: 18 % (ref 10.4–31.8)
TIBC: 253 ug/dL (ref 250–450)
UIBC: 208 ug/dL

## 2021-12-09 LAB — LIPID PANEL
Cholesterol: 107 mg/dL (ref 0–200)
HDL: 40 mg/dL — ABNORMAL LOW (ref >40)
LDL Cholesterol: 40 mg/dL (ref 0–99)
Total CHOL/HDL Ratio: 2.7 RATIO
Triglycerides: 135 mg/dL (ref <150)
VLDL: 27 mg/dL (ref 0–40)

## 2021-12-09 LAB — PROCALCITONIN: Procalcitonin: <0.1 ng/mL

## 2021-12-09 LAB — T4, FREE: Free T4: 1.09 ng/dL (ref 0.61–1.12)

## 2021-12-09 LAB — LACTIC ACID, PLASMA: Lactic Acid, Venous: 1.2 mmol/L (ref 0.5–1.9)

## 2021-12-09 LAB — CREATININE, URINE, RANDOM: Creatinine, Urine: 51.3 mg/dL

## 2021-12-09 LAB — CK: Total CK: 55 U/L (ref 38–234)

## 2021-12-09 LAB — MAGNESIUM: Magnesium: 1.7 mg/dL (ref 1.7–2.4)

## 2021-12-09 LAB — FERRITIN: Ferritin: 101 ng/mL (ref 11–307)

## 2021-12-09 LAB — SODIUM, URINE, RANDOM: Sodium, Ur: 72 mmol/L

## 2021-12-09 MED ORDER — NORTRIPTYLINE HCL 10 MG PO CAPS
40.0000 mg | ORAL_CAPSULE | Freq: Every day | ORAL | Status: DC
  Administered 2021-12-10 – 2021-12-16 (×8): 40 mg via ORAL
  Filled 2021-12-09 (×10): qty 4

## 2021-12-09 MED ORDER — LACTATED RINGERS IV SOLN: INTRAVENOUS | Status: DC

## 2021-12-09 MED ORDER — SODIUM CHLORIDE 0.9 % IV BOLUS
1000.0000 mL | Freq: Once | INTRAVENOUS | Status: AC
  Administered 2021-12-09: 1000 mL via INTRAVENOUS

## 2021-12-09 MED ORDER — FLUOXETINE HCL 20 MG PO CAPS
40.0000 mg | ORAL_CAPSULE | Freq: Every day | ORAL | Status: DC
  Administered 2021-12-10 – 2021-12-17 (×7): 40 mg via ORAL
  Filled 2021-12-09 (×8): qty 2

## 2021-12-09 NOTE — Assessment & Plan Note (Addendum)
Sepsis ruled out. Patient met SIRS criteria with hypothermia and tachypnea although I do not think the patient actually has sepsis. Follow-up on blood cultures. Patient was started on IV vancomycin and Rocephin which I we will narrow down to Rocephin only.  Low threshold to discontinue antibiotics. Monitor.

## 2021-12-09 NOTE — Assessment & Plan Note (Addendum)
No evidence of paroxysmal A-fib. Continue Eliquis. Cont lower dose metoprolol per above

## 2021-12-09 NOTE — Evaluation (Signed)
Physical Therapy Evaluation Patient Details Name: Valerie Mcclure MRN: [id number] DOB: [birth year] Today's Date: 12/09/2021  History of Present Illness  pt is an 81 y/o female admitted 5/22 with cone with syncope  via EMS from home.  MRI negative for acute abnormality.  PMHx: persistent Afib, chronic dHF, HTN, HLD, chronic sacral wound, CKD stage IV  Clinical Impression  Pt admitted with/for syncope/fall.  Pt now not at baseline function needing min to heavy mod assist for basic mobility and gait.  Pt currently limited functionally due to the problems listed. ( See problems list.)   Pt will benefit from PT to maximize function and safety in order to get ready for next venue listed below.        Recommendations for follow up therapy are one component of a multi-disciplinary discharge planning process, led by the attending physician.  Recommendations may be updated based on patient status, additional functional criteria and insurance authorization.  Follow Up Recommendations Acute inpatient rehab (3hours/day)    Assistance Recommended at Discharge Intermittent Supervision/Assistance  Patient can return home with the following  A little help with walking and/or transfers;A little help with bathing/dressing/bathroom;Assistance with cooking/housework;Assist for transportation;Help with stairs or ramp for entrance    Equipment Recommendations    Recommendations for Other Services       Functional Status Assessment Patient has had a recent decline in their functional status and demonstrates the ability to make significant improvements in function in a reasonable and predictable amount of time.     Precautions / Restrictions Precautions Precautions: Fall Restrictions Weight Bearing Restrictions: No      Mobility  Bed Mobility Overal bed mobility: Needs Assistance Bed Mobility: Supine to Sit, Sit to Supine     Supine to sit: Min assist Sit to supine: Mod assist   General bed  mobility comments: pt unable to lift her legs back into bed without assist.  Initially came up via L elbow with minimal assist overall.    Transfers Overall transfer level: Needs assistance Equipment used: 1 person hand held assist, 2 person hand held assist Transfers: Sit to/from Stand Sit to Stand: Mod assist, +2 safety/equipment           General transfer comment: cues for hand placement, assist pt coming forward more than boost.  Still pt needed legs against the bed frame for further stability due to moderate/heavy posterior lean.    Ambulation/Gait Ambulation/Gait assistance: Mod assist, Max assist, +2 physical assistance Gait Distance (Feet): 10 Feet Assistive device: 2 person hand held assist Gait Pattern/deviations: Step-to pattern, Step-through pattern   Gait velocity interpretation: <1.31 ft/sec, indicative of household ambulator Pre-gait activities: standing at EOB x4,  taking BP's,  peri care and change of undergarments, working on balance, shifting weight forward, w/shifts R/L for 2+ min each trial. General Gait Details: generally unsteady with narrowed BOS, short variable steps, stepping on her own feet, moderate posterior bias with some pulling on assistance to help balance and propel forward.  Noted the worst BP  at 70/40 after the short walk to the chair.  Stairs            Wheelchair Mobility    Modified Rankin (Stroke Patients Only)       Balance Overall balance assessment: Needs assistance Sitting-balance support: No upper extremity supported, Single extremity supported Sitting balance-Leahy Scale: Fair Sitting balance - Comments: still with tendency to list posterior, but able to manage without UE assist   Standing balance support: Bilateral upper extremity  supported, Single extremity supported, During functional activity Standing balance-Leahy Scale: Poor Standing balance comment: posterior bias, requiring external support today.                              Pertinent Vitals/Pain Pain Assessment Pain Assessment: No/denies pain (later reported bil LE sore from ?inflammation /cellulitis)    Home Living Family/patient expects to be discharged to:: Private residence Living Arrangements: Children Available Help at Discharge: Family;Available 24 hours/day Type of Home: Apartment Home Access: Stairs to enter Entrance Stairs-Rails: Can reach both Entrance Stairs-Number of Steps: 5   Home Layout: One level Home Equipment: Conservation officer, nature (2 wheels);Wheelchair - manual;Cane - single point      Prior Function Prior Level of Function : Independent/Modified Independent;History of Falls (last six months)             Mobility Comments: independent mobility, has used RW in past but not currently ADLs Comments: reports some assist with bathing, dressing (bra), limited IADLs, manages medication     Hand Dominance   Dominant Hand: Right    Extremity/Trunk Assessment        Lower Extremity Assessment Lower Extremity Assessment: Generalized weakness (some coordination issues during gait.)    Cervical / Trunk Assessment Cervical / Trunk Assessment: Kyphotic  Communication   Communication: Expressive difficulties  Cognition Arousal/Alertness: Awake/alert Behavior During Therapy: WFL for tasks assessed/performed Overall Cognitive Status: No family/caregiver present to determine baseline cognitive functioning                                          General Comments General comments (skin integrity, edema, etc.): Initial BP , supine in bed  117/45, HR 70--  Initial sitting BP  86/68   HR 108 bpm.  Subsequent BP's  standing  83/55  HR 98,  70/40  HR 82, standing 3 min.  BP sitting in chair (end value)  104/47   HR 72    Exercises     Assessment/Plan    PT Assessment Patient needs continued PT services  PT Problem List Decreased strength;Decreased activity tolerance;Decreased balance;Decreased  mobility;Decreased coordination;Decreased cognition;Decreased knowledge of use of DME       PT Treatment Interventions Gait training;Functional mobility training;Therapeutic activities;Balance training;Patient/family education    PT Goals (Current goals can be found in the Care Plan section)  Acute Rehab PT Goals Patient Stated Goal: non stated PT Goal Formulation: With patient Time For Goal Achievement: 12/23/21 Potential to Achieve Goals: Good    Frequency Min 3X/week     Co-evaluation PT/OT/SLP Co-Evaluation/Treatment: Yes Reason for Co-Treatment: For patient/therapist safety PT goals addressed during session: Mobility/safety with mobility         AM-PAC PT "6 Clicks" Mobility  Outcome Measure Help needed turning from your back to your side while in a flat bed without using bedrails?: A Little Help needed moving from lying on your back to sitting on the side of a flat bed without using bedrails?: A Little Help needed moving to and from a bed to a chair (including a wheelchair)?: A Lot Help needed standing up from a chair using your arms (e.g., wheelchair or bedside chair)?: A Little Help needed to walk in hospital room?: Total Help needed climbing 3-5 steps with a railing? : Total 6 Click Score: 13    End of Session Equipment Utilized During  Treatment: Gait belt Activity Tolerance: Patient tolerated treatment well;Patient limited by fatigue Patient left: in chair;with call bell/phone within reach;with chair alarm set Nurse Communication: Mobility status PT Visit Diagnosis: Unsteadiness on feet (R26.81);Other abnormalities of gait and mobility (R26.89);Other symptoms and signs involving the nervous system (R29.898)    Time: 1010-1032 PT Time Calculation (min) (ACUTE ONLY): 22 min   Charges:   PT Evaluation $PT Eval Moderate Complexity: 1 Mod          12/09/2021  Ginger Carne., PT Acute Rehabilitation Services [phone]  (pager) [phone]  (office)  Tessie Fass Jackquelyn Sundberg 12/09/2021, 10:59 AM

## 2021-12-09 NOTE — Progress Notes (Signed)
Progress Note Patient: Valerie Mcclure HFS:[id number] DOB: 1941/05/25 DOA: 12/08/2021  DOS: the patient was seen and examined on 12/09/2021  Brief hospital course: PMH of persistent A-fib on Eliquis, chronic HFpEF, HTN, HLD, hypothyroidism, CKD 4 presented with an episode of unwitnessed unresponsive episode at home. Assessment and Plan: * Syncope and collapse Severe orthostatic hypotension. Presents with an unwitnessed unresponsive event at home. Possibly while using bathroom. Denies any head injury or neck injury. Orthostatic positive.  Blood pressure dropped from 233 systolic to 70 systolic. We will provide IV hydration and monitor. Suspect may require compression stockings/midodrine leg therapy given her leg edema which will limit IV hydrations. Avoid antihypertensive medication.  Stroke-like symptom Presented with an unwitnessed pons event at home followed by patient had some right facial droop and right arm contraction.  Code stroke called on admission. CT head negative for any acute abnormality.  MRI brain negative for acute stroke.  MR angio negative for any large vessel occlusion.  Speech therapy consulted recommend dysphagia 3 diet.  Neurology recommended continuing current regimen. PT and OT recommending acute inpatient rehab.  Will monitor improvement in mobility with improvement in orthostasis.  SIRS (systemic inflammatory response syndrome) ([name]) Sepsis ruled out. Patient met SIRS criteria with hypothermia and tachypnea although I do not think the patient actually has sepsis. Follow-up on blood cultures. Patient was started on IV vancomycin and Rocephin which I we will narrow down to Rocephin only.  Low threshold to discontinue antibiotics. Monitor.  Acute renal failure superimposed on stage 4 chronic kidney disease ([name]) Metabolic acidosis. Baseline serum creatinine around 1.8.  On presentation serum creatinine 2.6 trending up to 2.7. Now trending down to 2.16 with  fluids.  Monitor. Mild metabolic acidosis in the setting of worsening renal function.  Anticipating improvement with IV hydration. May require IV albumin support due to lower extremity edema.  Bilateral edema of lower extremity Third spacing in the setting of hypoalbuminemia. Possibility of vascular disease cannot be ruled out. Given the chronicity of the process a DVT can also be possible. We will check ABI and Doppler. Patient went from Unna boots/compression stockings.  Persistent atrial fibrillation ([name]) No evidence of paroxysmal A-fib. Continue Eliquis.  Bradycardia Persistent A-fib. Reportedly patient's heart rate dropped down to 30s while the event happened. Patient does have chronic bradycardia history. At present holding all home antihypertensive regimen including AV blocker medications. On Eliquis for anticoagulation.  Chronic diastolic heart failure ([name]) Essential hypertension. Volume status appears adequate. Although at risk for worsening of lower extremity edema as she is  receiving IV fluid. Home regimen includes 20 mg torsemide daily, 25 mg Lopressor twice daily, lisinopril 2.5 mg daily, hydralazine 25 mg 3 times daily. At present I do not think the patient requires such an aggressive regimen. Patient may have lost some weight after losing her husband which might be responsible for correction of the blood pressure as well. Most likely will not require any medication on discharge.  Depression Holding home regimen of Pamelor and Prozac although can be resumed tomorrow.  Hyperlipidemia Continuing statin.  Hypothyroidism Continue Synthroid.  TSH 6.8, free T4 1.09.  Subjective: Feeling better but continues to have some fatigue and dizziness.  No chest pain abdominal pain.  Has some chronic lower extremity leg pain.  Appears to have a blister which she is not aware of.  Physical Exam: Vitals:   12/09/21 1116 12/09/21 1117 12/09/21 1528 12/09/21 1958  BP: (!)  98/45 (!) 113/53 (!) 128/43 (!) 103/91  Pulse:  80 86 72 93  Resp: $Remo'20  20 19  'odauP$ Temp: 97.9 F (36.6 C)  97.7 F (36.5 C) (!) 97.5 F (36.4 C)  TempSrc: Oral  Oral Oral  SpO2: 100%  100% 99%  Weight:      Height:       General: Appear in mild distress; no visible Abnormal Neck Mass Or lumps, Conjunctiva normal Cardiovascular: S1 and S2 Present, no Murmur, Respiratory: good respiratory effort, Bilateral Air entry present and CTA, no Crackles, no wheezes Abdomen: Bowel Sound present, Non tender  Extremities: bilateral Pedal edema with evidence of chronic venous stasis Neurology: alert and oriented to time, place, and person, having issues with right upper extremity from prior stroke Gait not checked due to patient safety concerns   Data Reviewed: I have Reviewed nursing notes, Vitals, and Lab results since pt's last encounter. Pertinent lab results CBC and BMP I have ordered test including CBC and BMP I have ordered imaging studies vascular Doppler ABI and venous Dopplers. I have discussed pt's care plan and test results with neurology.   Family Communication: Discussed with son  Disposition: Status is: Inpatient Remains inpatient appropriate because: Requires therapy for severe orthostasis and other work-up.  Author: Berle Mull, MD 12/09/2021 9:17 PM  Please look on [email or web address] to find out who is on call.

## 2021-12-09 NOTE — Progress Notes (Signed)
Valerie Mcclure requests to speak to MD [phone]

## 2021-12-09 NOTE — Assessment & Plan Note (Signed)
Presented with an unwitnessed pons event at home followed by patient had some right facial droop and right arm contraction.  Code stroke called on admission. CT head negative for any acute abnormality.  MRI brain negative for acute stroke.  MR angio negative for any large vessel occlusion.  Speech therapy consulted recommend dysphagia 3 diet.  Neurology recommended continuing current regimen. PT and OT recommending acute inpatient rehab. Per CIR, does not appear to demonstrate medical necessity to support CIR -TOC following

## 2021-12-09 NOTE — Progress Notes (Signed)
Modified Barium Swallow Progress Note  Patient Details  Name: Valerie Mcclure MRN: [id number] Date of Birth: [birth year]  Today's Date: 12/09/2021  Modified Barium Swallow completed.  Full report located under Chart Review in the Imaging Section.  Brief recommendations include the following:  Clinical Impression  Pt presents with mild oropharyngeal dysphagia c/b discoordination with oral transiting due to lingual weakness.  Pharyngeal swallow initiation with liquids was to pyriform sinus contributing to inconsistent laryngeal penetration of thin liquid *and ultra thin barium.  Pt did not cough during MBS as observed during clinical swallow evaluation.  Pharyngeal swallow is strong without retention fortunately.  She did not orally transit barium tablet with thin liquids despite 3 attempts - resulting in premature spillage of liquid into pharynx and penetration.  Pt required applesauce to transit masticated tablet.  Use of straw placed on left side improved oral transiting control.      Chin tuck not tested as not indicated and pt admits to difficulty with head ROM.  Upon esophageal sweep, pt appears with retention of tablet particles - recommend strict esophageal precautions including sitting upright after intake.  Suspect pt has an acute on chronic dysphagia per her report of occasional cough with po and reflux.  Using teach back with live video, educated pt to recommendations/precautions.  Will follow up x1 due to pt's dysphagia for pt/family education and po tolerance.   Swallow Evaluation Recommendations       SLP Diet Recommendations: Dysphagia 3 (Mech soft) solids;Thin liquid   Liquid Administration via: Cup;Straw   Medication Administration: Whole meds with puree (start and follow with liquids)   Supervision: Patient able to self feed   Compensations: Slow rate;Small sips/bites   Postural Changes: Remain semi-upright after after feeds/meals (Comment);Seated upright at 90  degrees   Oral Care Recommendations: Oral care BID      Kathleen Lime, MS [facility] SLP Acute Rehab Services Office [phone] Pager [phone]   Macario Golds 12/09/2021,10:00 AM

## 2021-12-09 NOTE — Evaluation (Signed)
Clinical/Bedside Swallow Evaluation Patient Details  Name: Valerie Mcclure MRN: [id number] Date of Birth: 10-01-1940  Today's Date: 12/09/2021 Time: SLP Start Time (ACUTE ONLY): 0745 SLP Stop Time (ACUTE ONLY): 0825 SLP Time Calculation (min) (ACUTE ONLY): 40 min  Past Medical History:  Past Medical History:  Diagnosis Date   Blood transfusion without reported diagnosis    Chronic low back pain    History of echocardiogram    Echo 5/18: EF 55-60, Gr 2 DD, mild MR, mild LAE, PASP 47 // Echo 1/18:  EF 55, mild AI, MAC, mild MR, mild LAE, trivial pericardial effusion   History of ischemic left MCA stroke 11/2016   left MCA infarct status post mechanical thrombectomy complicated by large left basal ganglia hemorrhage with right shift.   HTN (hypertension)    Hyperlipidemia    Hypothyroidism    Osteopenia    Persistent atrial fibrillation ([name])    CHADS2-VASc=6 (female, age 78, HTN, CVA) // Apixaban // rate control strategy    Sacral wound    chronic sacral wound   Past Surgical History:  Past Surgical History:  Procedure Laterality Date   ABDOMINAL HYSTERECTOMY  1970   CHOLECYSTECTOMY  07/2009   Dr. Rise Patience   COLONOSCOPY  2003   FLEXIBLE SIGMOIDOSCOPY  2010   HAND SURGERY     IR ANGIO INTRA EXTRACRAN Valerie COM CAROTID INNOMINATE UNI L MOD SED  11/29/2016   IR ANGIO VERTEBRAL Valerie SUBCLAVIAN INNOMINATE UNI R MOD SED  11/29/2016   IR PERCUTANEOUS ART THROMBECTOMY/INFUSION INTRACRANIAL INC DIAG ANGIO  11/29/2016   IR RADIOLOGIST EVAL & MGMT  03/24/2017   LUMBAR LAMINECTOMY  11/2008   Done by Dr. Patrice Paradise   RADIOLOGY WITH ANESTHESIA N/A 11/29/2016   Procedure: RADIOLOGY WITH ANESTHESIA;  Surgeon: Radiologist, Medication, MD;  Location: [place];  Service: Radiology;  Laterality: N/A;   THYROIDECTOMY     HPI:  Per MD report "Valerie Mcclure is a 81 y.o. female with medical history significant for persistent atrial fibrillation chronically anticoagulated on Eliquis, chronic diastolic  heart failure hypertension, hyperlipidemia, chronic sacral wound, acquired hypothyroidism, stage IV CKD with baseline creatinine 1.7-2.0, who is admitted to [facility] on 12/08/2021 with syncope, right facial droop and dysarthria.  MRI negative and CXR negative.  Pt did not pass Yale swallow screen.  Evaluation ordered.  Pt with PMH + for left MCA CVA in 2018.    Assessment / Plan / Recommendation  Clinical Impression  Pt presents with clinical indications of oropharyngeal dysphagia c/b suspected discoordinated swallow and cough post-swallow of thin liquid (x3/5 trials).  She did not pass 3 ounce Yale water screen.  Pt with no indication of aspiration with nectar, applesauce and graham cracker.  Pt also with belching s/p swallow as well as sensation of stasis in esophagus - pointing to mid esophageal region.  She reports this to be a chronic issue.  Given pt admitting that her speech is not as clear as normal, recent CXR being completed due to pt coughing and concern for aspiration, will proceed with MBS.  Pt undewent prior esophagram but does not recall results and states her esophageal function is at baseline. SLP Visit Diagnosis: Dysphagia, unspecified (R13.10)    Aspiration Risk  Moderate aspiration risk    Diet Recommendation Dysphagia 3 (Mech soft);Nectar-thick liquid   Liquid Administration via: Cup;Straw Medication Administration: Whole meds with puree Supervision: Patient able to self feed Compensations: Slow rate;Small sips/bites Postural Changes: Remain upright for at least 30 minutes  after po intake;Seated upright at 90 degrees    Other  Recommendations Oral Care Recommendations: Oral care BID    Recommendations for follow up therapy are one component of a multi-disciplinary discharge planning process, led by the attending physician.  Recommendations may be updated based on patient status, additional functional criteria and insurance authorization.  Follow up Recommendations  Other (comment) (tbd)      Assistance Recommended at Discharge Intermittent Supervision/Assistance  Functional Status Assessment Patient has had a recent decline in their functional status and demonstrates the ability to make significant improvements in function in a reasonable and predictable amount of time.  Frequency and Duration min 1 x/week  1 week       Prognosis Prognosis for Safe Diet Advancement: Good      Swallow Study   General Date of Onset: 12/09/21 HPI: Per MD report "Valerie Mcclure is a 81 y.o. female with medical history significant for persistent atrial fibrillation chronically anticoagulated on Eliquis, chronic diastolic heart failure hypertension, hyperlipidemia, chronic sacral wound, acquired hypothyroidism, stage IV CKD with baseline creatinine 1.7-2.0, who is admitted to [facility] on 12/08/2021 with syncope, right facial droop and dysarthria.  MRI negative and CXR negative.  Pt did not pass Yale swallow screen.  Evaluation ordered.  Pt with PMH + for left MCA CVA in 2018. Type of Study: Bedside Swallow Evaluation Diet Prior to this Study: NPO Temperature Spikes Noted: No Respiratory Status: Room air History of Recent Intubation: No Behavior/Cognition: Alert;Cooperative;Pleasant mood Oral Cavity Assessment: Within Functional Limits Oral Care Completed by SLP: Yes Oral Cavity - Dentition: Adequate natural dentition (partial at home) Vision: Functional for self-feeding Self-Feeding Abilities: Able to feed self Patient Positioning: Upright in bed Baseline Vocal Quality: Normal Volitional Cough: Strong Volitional Swallow: Able to elicit    Oral/Motor/Sensory Function Overall Oral Motor/Sensory Function: Mild impairment Facial ROM: Reduced right Facial Symmetry: Abnormal symmetry right Facial Strength: Within Functional Limits Facial Sensation: Reduced right;Suspected CN V (Trigeminal) dysfunction Lingual ROM: Reduced right;Suspected CN XII  (hypoglossal) dysfunction Lingual Symmetry: Within Functional Limits Lingual Strength: Reduced;Suspected CN XII (hypoglossal) dysfunction Lingual Sensation: Other (Comment) Velum: Within Functional Limits   Ice Chips Ice chips: Not tested   Thin Liquid Thin Liquid: Impaired Presentation: Cup;Self Fed;Spoon;Straw Pharyngeal  Phase Impairments: Cough - Delayed Other Comments: suspect discoordinated swallow initiation    Nectar Thick Nectar Thick Liquid: Within functional limits Presentation: Cup   Honey Thick Honey Thick Liquid: Not tested   Puree Puree: Within functional limits Presentation: Self Fed;Spoon   Solid     Solid: Within functional limits Presentation: Self Fredirick Mcclure 12/09/2021,8:33 AM  Kathleen Lime, MS [place] Office [phone] Pager [phone]

## 2021-12-09 NOTE — Assessment & Plan Note (Signed)
Holding home regimen of Pamelor and Prozac although can be resumed tomorrow.

## 2021-12-09 NOTE — Assessment & Plan Note (Signed)
Third spacing in the setting of hypoalbuminemia. Possibility of vascular disease cannot be ruled out. Given the chronicity of the process a DVT can also be possible. We will check ABI and Doppler. Patient went from [place]na boots/compression stockings.

## 2021-12-09 NOTE — Plan of Care (Signed)

## 2021-12-09 NOTE — TOC Initial Note (Signed)
Transition of Care [facility]) - Initial/Assessment Note    Patient Details  Name: Valerie Mcclure MRN: [id number] Date of Birth: [birth year]  Transition of Care [facility]) CM/SW Contact:    Pollie Friar, RN Phone Number: 12/09/2021, 3:49 PM  Clinical Narrative:                 CM met with the patient and due to her speech issues CM inquired about talking to her daughter. Pt agreed. Daughter states she is not interested in SNF rehab and want pt to d/c home with [facility] therapies. Daughter states she will have the support at home to provide the care the patient needs.  Daughter manager her medications.  Family provides needed transportation.  TOC following to arrange [facility] services for when pt is medically ready to d/c.   Expected Discharge Plan: [place] Barriers to Discharge: Continued Medical Work up   Patient Goals and CMS Choice   CMS Medicare.gov Compare Post Acute Care list provided to:: Patient Represenative (must comment) Choice offered to / list presented to : Adult Children  Expected Discharge Plan and Services Expected Discharge Plan: [place]   Discharge Planning Services: CM Consult Post Acute Care Choice: [place] arrangements for the past 2 months: Single Family Home                                      Prior Living Arrangements/Services Living arrangements for the past 2 months: Single Family Home Lives with:: Adult Children Patient language and need for interpreter reviewed:: Yes Do you feel safe going back to the place where you live?: Yes      Need for Family Participation in Patient Care: Yes (Comment) Care giver support system in place?: Yes (comment) Current home services: DME (cane/ wlaker/ shower seat/ wheelchair) Criminal Activity/Legal Involvement Pertinent to Current Situation/Hospitalization: No - Comment as needed  Activities of Daily Living Home Assistive Devices/Equipment: Eyeglasses ADL Screening  (condition at time of admission) Patient's cognitive ability adequate to safely complete daily activities?: Yes Is the patient deaf or have difficulty hearing?: No Does the patient have difficulty seeing, even when wearing glasses/contacts?: No Does the patient have difficulty concentrating, remembering, or making decisions?: Yes Patient able to express need for assistance with ADLs?: Yes Does the patient have difficulty dressing or bathing?: No Independently performs ADLs?: No Communication: Independent Dressing (OT): Independent Grooming: Independent Feeding: Independent Bathing: Independent Toileting: Independent In/Out Bed: Independent Walks in Home: Independent Does the patient have difficulty walking or climbing stairs?: No Weakness of Legs: Both Weakness of Arms/Hands: None  Permission Sought/Granted                  Emotional Assessment Appearance:: Appears stated age Attitude/Demeanor/Rapport: Engaged Affect (typically observed): Accepting Orientation: : Oriented to Self, Oriented to Place   Psych Involvement: No (comment)  Admission diagnosis:  Syncope and collapse [R55] Syncope [R55] General weakness [R53.1] Dysarthria [R47.1] Acute renal failure, unspecified acute renal failure type ([place]) [N17.9] Skin ulcer of sacrum, unspecified ulcer stage [facility]) [L98.429] Patient Active Problem List   Diagnosis Date Noted   Syncope and collapse 12/09/2021   Syncope 12/08/2021   Generalized weakness 12/08/2021   Severe sepsis ([place]) 12/08/2021   Hypotension 12/08/2021   Paroxysmal atrial fibrillation ([place]) 12/08/2021   Leg ulcer, left ([place]) 10/13/2021   Eye tearing, left 02/19/2020   TMJ  syndrome, left 02/19/2020   URI (upper respiratory infection) 01/24/2020   Lump in neck 06/08/2018   Sleep difficulties 03/31/2018   Persistent atrial fibrillation ([place]) 09/06/2017   TIA (transient ischemic attack) 07/12/2017   Bradycardia 04/12/2017   Depression 01/29/2017    Fall    Aphasia as late effect of cerebrovascular accident (CVA)    Acute renal failure superimposed on stage 4 chronic kidney disease ([place]) 01/11/2017   Chronic diastolic heart failure ([name])    Atrial flutter ([place]) 12/22/2016   Spastic hemiplegia of right dominant side as late effect of cerebral infarction ([place])    Anemia of chronic disease    Hypertension    Dysphagia, post-stroke    Anterior cerebral circulation hemorrhagic infarction ([place]) 12/03/2016   Right hemiparesis ([place])    Nontraumatic subcortical hemorrhage of left cerebral hemisphere ([name])    Mild aortic regurgitation 08/20/2016   Bilateral edema of lower extremity 04/07/2016   Pancreatic duct dilated 01/09/2016   Stage 4 chronic kidney disease ([place]) 12/25/2015   Mild tricuspid regurgitation 12/25/2015   Mild mitral regurgitation 12/25/2015   Diabetes mellitus without complication ([place]) 94/94/4739   Abdominal pain, chronic, right upper quadrant 06/04/2015   Chronic low back pain    Pain in thoracic spine 04/18/2010   Coronary atherosclerosis 01/24/2009   ARTHRITIS, LEFT KNEE 12/13/2008   Hypothyroidism 11/21/2007   Hyperlipidemia 11/09/2007   Osteopenia 11/09/2007   PCP:  [name], MD Pharmacy:   [facility] [place], [address] AT [address] [place] [address] Phone: [phone] Fax: [phone]     Social Determinants of Health (SDOH) Interventions    Readmission Risk Interventions     View : No data to display.

## 2021-12-09 NOTE — Assessment & Plan Note (Addendum)
Metabolic acidosis. Baseline serum creatinine around 1.8.  On presentation serum creatinine 2.6 trending up to 2.7. Now trending down to 2.16 with fluids.  Monitor. Mild metabolic acidosis in the setting of worsening renal function.  Anticipating improvement with IV hydration. May require IV albumin support due to lower extremity edema.

## 2021-12-09 NOTE — Assessment & Plan Note (Addendum)
Persistent A-fib. Reportedly patient's heart rate dropped down to 30s while the event happened. Patient does have chronic bradycardia history. Initially held all home antihypertensive regimen including AV blocker medications. On Eliquis for anticoagulation. Now tachycardic with stable BP, restarted metoprolol at lower dose of 12.'5mg'$  bid with hold parameters

## 2021-12-09 NOTE — Progress Notes (Addendum)
MBS completed, full report to follow.  Pt presents with mild oral dysphagia c/b discoordination with oral transiting due to lingual weakness.  Pharyngeal swallow initiation with liquids was to pyriform sinus contributing to inconsistent laryngeal penetration of thin liquid *and ultra thin barium.  Pt did not cough during MBS as observed during clinical swallow evaluation.  Pharyngeal swallow is strong without retention fortunately.  She did not orally transit barium tablet with thin liquids despite 3 attempts - resulting in premature spillage of liquid into pharynx and penetration.  Pt required applesauce to transit masticated tablet.  Use of straw placed on left side improved oral transiting control.    Upon esophageal sweep, pt appears with retention of tablet particles - recommend strict esophageal precautions including sitting upright after intake.  Using teach back with live video, educated pt to recommendations/precautions.  Will follow up x1 due to pt's dysphagia for pt/family education and po tolerance.    Kathleen Lime, MS [facility] SLP Acute Rehab Services Office [phone] Pager [phone]

## 2021-12-09 NOTE — Assessment & Plan Note (Signed)
Severe orthostatic hypotension. Presents with an unwitnessed unresponsive event at home. Possibly while using bathroom. Denies any head injury or neck injury. Orthostatic positive.  Blood pressure dropped from 672 systolic to 70 systolic. We will provide IV hydration and monitor. Suspect may require compression stockings/midodrine leg therapy given her leg edema which will limit IV hydrations. Avoid antihypertensive medication.

## 2021-12-09 NOTE — Assessment & Plan Note (Addendum)
Essential hypertension. Volume status appears adequate. Although at risk for worsening of lower extremity edema as she is  receiving IV fluid. Home regimen includes 20 mg torsemide daily, 25 mg Lopressor twice daily, lisinopril 2.5 mg daily, hydralazine 25 mg 3 times daily. At present I do not think the patient requires such an aggressive regimen. Patient may have lost some weight after losing her husband which might be responsible for correction of the blood pressure as well. Most likely will not require any medication on discharge.

## 2021-12-09 NOTE — Progress Notes (Signed)
Carotid duplex bilateral study completed.   Please see CV Proc for preliminary results.   Zael Shuman, RDMS, RVT  

## 2021-12-09 NOTE — Hospital Course (Addendum)
81 year old female with history of persistent A-fib on Eliquis, chronic HFpEF, HTN, HLD, hypothyroidism, CKD 4 presented with an episode of unwitnessed unresponsive episode at home.  Apparently patient was found unresponsive in her bathroom with a heart rate of 30 and her right arm was contractured.  Patient was seen in the ED-EKG with A-fib rate controlled, labs showed AKI hypoalbuminemia, anemia.  Chest x-ray no active disease, CT head code stroke no acute finding, redemonstration of chronic left MCA territory infarct within the left temporal lobe insula and basal ganglia chronic small vessel changes, neurology was consulted, patient was admitted for further management.  She is being treated for syncope and collapse due to orthostatic hypotension, acute metabolic encephalopathy,no acute stroke on MRI.

## 2021-12-09 NOTE — Assessment & Plan Note (Signed)
Continuing statin 

## 2021-12-09 NOTE — Assessment & Plan Note (Signed)
Continue Synthroid.  TSH 6.8, free T4 1.09.

## 2021-12-09 NOTE — Progress Notes (Signed)
Inpatient Rehab Admissions Coordinator:   Per PT/OT recommendations pt was screened for CIR candidacy by Shann Medal, PT, DPT.  Note pt with significant functional deficits and needs for therapy in 3 disciplines; unfortunately pt does not appear to demonstrate the medical necessity to support a CIR admit at this time.    Shann Medal, PT, DPT Admissions Coordinator [phone] 12/09/21  2:16 PM

## 2021-12-09 NOTE — Evaluation (Signed)
Occupational Therapy Evaluation Patient Details Name: Valerie Mcclure MRN: [id number] DOB: 06/08/1941 Today's Date: 12/09/2021   History of Present Illness pt is an 81 y/o female admitted 5/22 with cone with syncope  via EMS from home.  MRI negative for acute abnormality.  Found with hypotension, sepsis. PMHx: persistent Afib, chronic dHF, HTN, HLD, chronic sacral wound, CKD stage IV   Clinical Impression   PTA patient reports independent with most ADLs (at times needs assist for bathing and donning bra), mobility and limited IADLs (she reports driving and managing meds).  Admitted for above and presents with problem list below, including impaired balance, generalized weakness, decreased activity tolerance and impaired cognition.  Pt with baseline expressive difficulties since CVA 4 years ago, requiring increased time for processing, problem solving, sequencing and safety.  Reports 2021 but when corrected voiced "I knew that was wrong".  Patient currently requires min-total assist for ADLs, min-mod assist for bed mobility and mod assist +2 safety for transfers.  BP monitored during session with values below, pt denies symptoms throughout session but reports "feeling different" after making it to the chair.  Patient will benefit from continued OT services while admitted and after dc at AIR level to optimize independence, safety and return to PLOF. Will follow.   BP supine: 117/45 70HR  BP sitting 86/68 HR 108  BP after rest supine and returned sitting: 92/76  108HR  BP standing: 83/55 HR 98 BP after activity to chair: 70/40 HR 82  BP in chair after resting at end of session: 104/47 HR 72      Recommendations for follow up therapy are one component of a multi-disciplinary discharge planning process, led by the attending physician.  Recommendations may be updated based on patient status, additional functional criteria and insurance authorization.   Follow Up Recommendations  Acute inpatient  rehab (3hours/day)    Assistance Recommended at Discharge Frequent or constant Supervision/Assistance  Patient can return home with the following A lot of help with walking and/or transfers;A lot of help with bathing/dressing/bathroom;Assistance with cooking/housework;Direct supervision/assist for medications management;Direct supervision/assist for financial management;Assist for transportation;Help with stairs or ramp for entrance    Functional Status Assessment  Patient has had a recent decline in their functional status and demonstrates the ability to make significant improvements in function in a reasonable and predictable amount of time.  Equipment Recommendations  BSC/3in1    Recommendations for Other Services       Precautions / Restrictions Precautions Precautions: Fall Restrictions Weight Bearing Restrictions: No      Mobility Bed Mobility Overal bed mobility: Needs Assistance Bed Mobility: Supine to Sit, Sit to Supine     Supine to sit: Min assist Sit to supine: Mod assist   General bed mobility comments: pt unable to lift her legs back into bed without assist.  Initially came up via L elbow with minimal assist overall.    Transfers Overall transfer level: Needs assistance Equipment used: 1 person hand held assist, 2 person hand held assist Transfers: Sit to/from Stand Sit to Stand: Mod assist, +2 safety/equipment           General transfer comment: cues for hand placement, foward lean.  Heavy posterior lean      Balance Overall balance assessment: Needs assistance Sitting-balance support: No upper extremity supported, Single extremity supported Sitting balance-Leahy Scale: Fair Sitting balance - Comments: still with tendency to list posterior, but able to manage without UE assist; limited dynamically   Standing balance support: Bilateral upper  extremity supported, Single extremity supported, During functional activity Standing balance-Leahy Scale:  Poor Standing balance comment: posterior bias, requiring external support today.                           ADL either performed or assessed with clinical judgement   ADL Overall ADL's : Needs assistance/impaired     Grooming: Set up;Sitting           Upper Body Dressing : Minimal assistance;Sitting   Lower Body Dressing: +2 for safety/equipment;Total assistance;Sit to/from stand   Toilet Transfer: Moderate assistance;+2 for safety/equipment;Ambulation Toilet Transfer Details (indicate cue type and reason): bil hand held assist, simulated in room to [place] and Hygiene: Maximal assistance;Sit to/from stand       Functional mobility during ADLs: Moderate assistance;+2 for safety/equipment General ADL Comments: pt limited by balance, orthostatic hypotension and cognition     Vision Baseline Vision/History: 0 No visual deficits Patient Visual Report: No change from baseline Vision Assessment?: No apparent visual deficits     Perception     Praxis      Pertinent Vitals/Pain Pain Assessment Pain Assessment: No/denies pain     Hand Dominance Right   Extremity/Trunk Assessment Upper Extremity Assessment Upper Extremity Assessment: Generalized weakness   Lower Extremity Assessment Lower Extremity Assessment: Defer to PT evaluation   Cervical / Trunk Assessment Cervical / Trunk Assessment: Kyphotic   Communication Communication Communication: Expressive difficulties (from prior CVA)   Cognition Arousal/Alertness: Awake/alert Behavior During Therapy: WFL for tasks assessed/performed Overall Cognitive Status: No family/caregiver present to determine baseline cognitive functioning Area of Impairment: Orientation, Awareness, Problem solving, Safety/judgement, Memory, Following commands, Attention                 Orientation Level: Disoriented to, Time Current Attention Level: Sustained Memory: Decreased recall of  precautions, Decreased short-term memory Following Commands: Follows one step commands consistently, Follows one step commands with increased time, Follows multi-step commands inconsistently Safety/Judgement: Decreased awareness of safety, Decreased awareness of deficits Awareness: Emergent Problem Solving: Slow processing, Difficulty sequencing, Requires verbal cues General Comments: pt disoriented to year but question expressive difficulties.  Noted poor awareness, safety and problem solving.     General Comments  BP monitiored during session    Exercises     Shoulder Instructions      Home Living Family/patient expects to be discharged to:: Private residence Living Arrangements: Children Available Help at Discharge: Family;Available 24 hours/day Type of Home: Apartment Home Access: Stairs to enter Entrance Stairs-Number of Steps: 5 Entrance Stairs-Rails: Can reach both Home Layout: One level     Bathroom Shower/Tub: Occupational psychologist: Handicapped height     Home Equipment: Conservation officer, nature (2 wheels);Wheelchair - manual;Cane - single point          Prior Functioning/Environment Prior Level of Function : Independent/Modified Independent;History of Falls (last six months);Driving             Mobility Comments: independent mobility, has used RW in past but not currently ADLs Comments: reports some assist with bathing, dressing (bra), limited IADLs, manages medication        OT Problem List: Decreased strength;Decreased activity tolerance;Impaired balance (sitting and/or standing);Decreased cognition;Decreased safety awareness;Decreased knowledge of use of DME or AE;Decreased knowledge of precautions;Cardiopulmonary status limiting activity      OT Treatment/Interventions: Self-care/ADL training;Therapeutic exercise;DME and/or AE instruction;Cognitive remediation/compensation;Therapeutic activities;Patient/family education;Balance training    OT  Goals(Current goals can be found  in the care plan section) Acute Rehab OT Goals Patient Stated Goal: get better OT Goal Formulation: With patient Time For Goal Achievement: 12/23/21 Potential to Achieve Goals: Good  OT Frequency: Min 2X/week    Co-evaluation PT/OT/SLP Co-Evaluation/Treatment: Yes Reason for Co-Treatment: For patient/therapist safety;To address functional/ADL transfers PT goals addressed during session: Mobility/safety with mobility OT goals addressed during session: ADL's and self-care      AM-PAC OT "6 Clicks" Daily Activity     Outcome Measure Help from another person eating meals?: A Little Help from another person taking care of personal grooming?: A Little Help from another person toileting, which includes using toliet, bedpan, or urinal?: Total Help from another person bathing (including washing, rinsing, drying)?: A Lot Help from another person to put on and taking off regular upper body clothing?: A Little Help from another person to put on and taking off regular lower body clothing?: A Lot 6 Click Score: 14   End of Session Nurse Communication: Mobility status  Activity Tolerance: Patient tolerated treatment well Patient left: in chair;with call bell/phone within reach;with chair alarm set  OT Visit Diagnosis: Other abnormalities of gait and mobility (R26.89);Muscle weakness (generalized) (M62.81);Other symptoms and signs involving cognitive function;History of falling (Z91.81)                Time: 1093-2355 OT Time Calculation (min): 47 min Charges:  OT General Charges $OT Visit: 1 Visit OT Evaluation $OT Eval Moderate Complexity: 1 Mod OT Treatments $Self Care/Home Management : 8-22 mins  Jolaine Artist, OT Acute Rehabilitation Services Pager [phone] Office [phone]   Delight Stare 12/09/2021, 11:35 AM

## 2021-12-10 ENCOUNTER — Inpatient Hospital Stay (HOSPITAL_COMMUNITY): Payer: HMO

## 2021-12-10 DIAGNOSIS — R600 Localized edema: Secondary | ICD-10-CM

## 2021-12-10 DIAGNOSIS — R55 Syncope and collapse: Secondary | ICD-10-CM | POA: Diagnosis not present

## 2021-12-10 DIAGNOSIS — N179 Acute kidney failure, unspecified: Secondary | ICD-10-CM | POA: Diagnosis not present

## 2021-12-10 LAB — CBC
HCT: 28.8 % — ABNORMAL LOW (ref 36.0–46.0)
Hemoglobin: 9.2 g/dL — ABNORMAL LOW (ref 12.0–15.0)
MCH: 31.1 pg (ref 26.0–34.0)
MCHC: 31.9 g/dL (ref 30.0–36.0)
MCV: 97.3 fL (ref 80.0–100.0)
Platelets: 156 10*3/uL (ref 150–400)
RBC: 2.96 MIL/uL — ABNORMAL LOW (ref 3.87–5.11)
RDW: 15.9 % — ABNORMAL HIGH (ref 11.5–15.5)
WBC: 9.1 10*3/uL (ref 4.0–10.5)
nRBC: 0 % (ref 0.0–0.2)

## 2021-12-10 LAB — COMPREHENSIVE METABOLIC PANEL
ALT: 23 U/L (ref 0–44)
AST: 26 U/L (ref 15–41)
Albumin: 2.1 g/dL — ABNORMAL LOW (ref 3.5–5.0)
Alkaline Phosphatase: 67 U/L (ref 38–126)
Anion gap: 7 (ref 5–15)
BUN: 40 mg/dL — ABNORMAL HIGH (ref 8–23)
CO2: 16 mmol/L — ABNORMAL LOW (ref 22–32)
Calcium: 8.3 mg/dL — ABNORMAL LOW (ref 8.9–10.3)
Chloride: 116 mmol/L — ABNORMAL HIGH (ref 98–111)
Creatinine, Ser: 1.93 mg/dL — ABNORMAL HIGH (ref 0.44–1.00)
GFR, Estimated: 26 mL/min — ABNORMAL LOW (ref >60)
Glucose, Bld: 96 mg/dL (ref 70–99)
Potassium: 4.5 mmol/L (ref 3.5–5.1)
Sodium: 139 mmol/L (ref 135–145)
Total Bilirubin: 0.8 mg/dL (ref 0.3–1.2)
Total Protein: 4.9 g/dL — ABNORMAL LOW (ref 6.5–8.1)

## 2021-12-10 LAB — MAGNESIUM: Magnesium: 1.7 mg/dL (ref 1.7–2.4)

## 2021-12-10 MED ORDER — METOPROLOL TARTRATE 12.5 MG HALF TABLET
12.5000 mg | ORAL_TABLET | Freq: Two times a day (BID) | ORAL | Status: DC
  Administered 2021-12-10 (×2): 12.5 mg via ORAL
  Filled 2021-12-10 (×2): qty 1

## 2021-12-10 MED ORDER — METOPROLOL TARTRATE 25 MG PO TABS: 25.0000 mg | ORAL_TABLET | Freq: Two times a day (BID) | ORAL | Status: DC

## 2021-12-10 MED ORDER — ALBUMIN HUMAN 25 % IV SOLN
25.0000 g | Freq: Once | INTRAVENOUS | Status: AC
Start: 2021-12-10 — End: 2021-12-11
  Administered 2021-12-10: 25 g via INTRAVENOUS
  Filled 2021-12-10: qty 100

## 2021-12-10 MED ORDER — ALPRAZOLAM 0.25 MG PO TABS
0.2500 mg | ORAL_TABLET | Freq: Two times a day (BID) | ORAL | Status: DC | PRN
  Administered 2021-12-10 – 2021-12-13 (×2): 0.25 mg via ORAL
  Filled 2021-12-10 (×2): qty 1

## 2021-12-10 MED ORDER — CLONAZEPAM 0.25 MG PO TBDP
0.2500 mg | ORAL_TABLET | Freq: Two times a day (BID) | ORAL | Status: DC | PRN
  Filled 2021-12-10 (×2): qty 1

## 2021-12-10 NOTE — Progress Notes (Signed)
Patient is not redirectable to follow commands to stay in bed. Attempting to get out of bed and not following commands. Valerie Mcclure becomes aggressive with staff when redirecting her to remain in bed. Will order TeleSitter [employer] for patient safety. Family called earlier and I spoke with son regarding aggression, he stated they were unable to come to bedside until after work.

## 2021-12-10 NOTE — Progress Notes (Signed)
Lower extremity venous bilateral and ABI attempted. Patient currently agitated/combative. Spoke with Karlene Einstein, RN, and will attempt again as patient disposition and schedule allows.  Darlin Coco, RDMS, RVT

## 2021-12-10 NOTE — Progress Notes (Signed)
IVT consulted for PIV placement.  Pt stated "no" to having a PIV placed.  Pt seemed to be aware of need, however said she doesn't want an IV.  RN, Ivy present during conversation.

## 2021-12-10 NOTE — Progress Notes (Signed)
Patient agreeable to work with PT, this RN went into room to try to give scheduled metoprolol. Patient agreeable to take tablet.

## 2021-12-10 NOTE — Progress Notes (Signed)
Daughter [name] called while staff giving patient bath. Return call placed to [place], Ms. Tat able to talk to [facility] via phone. Updated provided by this RN, plan is for PT to see patient again. Patient remains confused intermittently.

## 2021-12-10 NOTE — Progress Notes (Signed)
Patient pulled out PIV in right FA, this is the 3rd IV site that patient has removed due to confusion. IV fluids stopped at his time. Dr. Wyline Copas notified that despite staff RN attempts to keep IV hidden under dressings patient continues to remove IV sites.  Dr. Wyline Copas stating that patient is dehydrated and needs IV fluids, this RN told him patient is tolerating diet well.  IV team consult order placed for new IV site as no response from Dr. Wyline Copas with regards to tolerating diet.

## 2021-12-10 NOTE — Progress Notes (Signed)
Notified by Staffing/ Video Monitoring Department that there are no [employer]/ Telesitter cameras available at this time.

## 2021-12-10 NOTE — Progress Notes (Signed)
3W 26 - [place] - currently refusing IV and mitts. IV team at bedside, she will document refusal. Pt is going to [facility]. Thanks [employer]

## 2021-12-10 NOTE — Progress Notes (Signed)
Physical Therapy Treatment Patient Details Name: Valerie Mcclure MRN: [id number] DOB: 18-May-1941 Today's Date: 12/10/2021   History of Present Illness pt is an 81 y/o female admitted 5/22 with cone with syncope  via EMS from home.  MRI negative for acute abnormality.  Found with hypotension, sepsis. PMHx: persistent Afib, chronic dHF, HTN, HLD, chronic sacral wound, CKD stage IV    PT Comments    Pt has not progressed much since yesterday and appears to be close to delirious.  Emphasis on transitions, scooting, safe sit to stands, progression of gait stability, quality with the RW today.    Recommendations for follow up therapy are one component of a multi-disciplinary discharge planning process, led by the attending physician.  Recommendations may be updated based on patient status, additional functional criteria and insurance authorization.  Follow Up Recommendations  Acute inpatient rehab (3hours/day)     Assistance Recommended at Discharge Intermittent Supervision/Assistance  Patient can return home with the following A little help with walking and/or transfers;A little help with bathing/dressing/bathroom;Assistance with cooking/housework;Assist for transportation;Help with stairs or ramp for entrance   Equipment Recommendations  Other (comment) (TBA)    Recommendations for Other Services       Precautions / Restrictions Precautions Precautions: Fall     Mobility  Bed Mobility Overal bed mobility: Needs Assistance Bed Mobility: Supine to Sit, Sit to Supine     Supine to sit: Min assist Sit to supine: Mod assist   General bed mobility comments: pt unable to lift her legs back into bed without assist.  Initially came up via L elbow with minimal assist overall.    Transfers Overall transfer level: Needs assistance Equipment used: Rolling walker (2 wheels) Transfers: Sit to/from Stand Sit to Stand: Mod assist, +2 safety/equipment           General transfer  comment: cues for hand placement and getting fully to EOB,  assist to help her forward and for boost.    Ambulation/Gait Ambulation/Gait assistance: Mod assist, Max assist, +2 physical assistance Gait Distance (Feet): 25 Feet Assistive device: 2 person hand held assist Gait Pattern/deviations: Step-to pattern, Step-through pattern   Gait velocity interpretation: <1.31 ft/sec, indicative of household ambulator   General Gait Details: Again, unsteady, narrowed adducted BOS with short steps, poor heal/toe pattern.  Needed to moderately help maneuver the RW   Stairs             Wheelchair Mobility    Modified Rankin (Stroke Patients Only)       Balance Overall balance assessment: Needs assistance   Sitting balance-Leahy Scale: Fair Sitting balance - Comments: Again, with tendency to list posterior, but able to manage without UE assist,  w/shifts difficult making scooting difficult.   Standing balance support: Bilateral upper extremity supported, Single extremity supported, During functional activity Standing balance-Leahy Scale: Poor Standing balance comment: listing more R than posterior today, still requiring external support and AD today.                            Cognition Arousal/Alertness: Awake/alert Behavior During Therapy: Restless, Anxious Overall Cognitive Status: No family/caregiver present to determine baseline cognitive functioning (pt appears delerious today.)                                          Exercises  General Comments        Pertinent Vitals/Pain Pain Assessment Pain Assessment: Faces Faces Pain Scale: No hurt Pain Intervention(s): Monitored during session    Home Living                          Prior Function            PT Goals (current goals can now be found in the care plan section) Acute Rehab PT Goals Patient Stated Goal: non stated PT Goal Formulation: With patient Time For  Goal Achievement: 12/23/21 Potential to Achieve Goals: Good Progress towards PT goals: Progressing toward goals    Frequency    Min 3X/week      PT Plan Current plan remains appropriate    Co-evaluation              AM-PAC PT "6 Clicks" Mobility   Outcome Measure  Help needed turning from your back to your side while in a flat bed without using bedrails?: A Little Help needed moving from lying on your back to sitting on the side of a flat bed without using bedrails?: A Little Help needed moving to and from a bed to a chair (including a wheelchair)?: A Lot Help needed standing up from a chair using your arms (e.g., wheelchair or bedside chair)?: A Lot Help needed to walk in hospital room?: Total Help needed climbing 3-5 steps with a railing? : Total 6 Click Score: 12    End of Session Equipment Utilized During Treatment: Gait belt Activity Tolerance: Patient tolerated treatment well;Patient limited by fatigue Patient left: in bed;with call bell/phone within reach;with bed alarm set;with nursing/sitter in room Nurse Communication: Mobility status PT Visit Diagnosis: Unsteadiness on feet (R26.81);Other abnormalities of gait and mobility (R26.89);Other symptoms and signs involving the nervous system (R29.898)     Time: 8676-7209 PT Time Calculation (min) (ACUTE ONLY): 26 min  Charges:  $Gait Training: 8-22 mins $Therapeutic Activity: 8-22 mins                     12/10/2021  Ginger Carne., PT Acute Rehabilitation Services [phone]  (pager) [phone]  (office)   Tessie Fass Ranie Chinchilla 12/10/2021, 4:40 PM

## 2021-12-10 NOTE — Progress Notes (Signed)
Progress Note   Patient: Valerie Mcclure DBZ:[id number] DOB: 12/09/1940 DOA: 12/08/2021     1 DOS: the patient was seen and examined on 12/10/2021   Brief hospital course: PMH of persistent A-fib on Eliquis, chronic HFpEF, HTN, HLD, hypothyroidism, CKD 4 presented with an episode of unwitnessed unresponsive episode at home.  Assessment and Plan: * Syncope and collapse Severe orthostatic hypotension. Presents with an unwitnessed unresponsive event at home. Possibly while using bathroom. Denies any head injury or neck injury. Orthostatic positive.  Blood pressure dropped from 122 systolic to 70 systolic. Still appears dry on exam as evidenced by dry membranes, increased thirst, ARF -Cont hydration as tolerated  Stroke-like symptom Presented with an unwitnessed pons event at home followed by patient had some right facial droop and right arm contraction.  Code stroke called on admission. CT head negative for any acute abnormality.  MRI brain negative for acute stroke.  MR angio negative for any large vessel occlusion.  Speech therapy consulted recommend dysphagia 3 diet.  Neurology recommended continuing current regimen. PT and OT recommending acute inpatient rehab. Per CIR, does not appear to demonstrate medical necessity to support CIR -TOC following  SIRS (systemic inflammatory response syndrome) ([name]) Sepsis ruled out. Patient met SIRS criteria with hypothermia and tachypnea although I do not think the patient actually has sepsis. Follow-up on blood cultures. Patient was started on IV vancomycin and Rocephin which I we will narrow down to Rocephin only.  Low threshold to discontinue antibiotics. Monitor.  Acute renal failure superimposed on stage 4 chronic kidney disease ([name]) Metabolic acidosis. Baseline serum creatinine around 1.8.  On presentation serum creatinine 2.6 trending up to 2.7. Now trending down to 1.93 with fluids.  Monitor. Mild metabolic acidosis in the setting  of worsening renal function.  Still appears clinically dehydrated with dry lips and mucus membranes Would cont IVF as tolerated LE edema noted in setting of low albumin. Will give one dose of IV albumin  Bilateral edema of lower extremity Third spacing in the setting of hypoalbuminemia. Possibility of vascular disease cannot be ruled out. Given the chronicity of the process a DVT can also be possible. ABI and Doppler were ordered, pending Patient went from Unna boots/compression stockings.  Persistent atrial fibrillation ([name]) No evidence of paroxysmal A-fib. Continue Eliquis. Cont lower dose metoprolol per above  Bradycardia Persistent A-fib. Reportedly patient's heart rate dropped down to 30s while the event happened. Patient does have chronic bradycardia history. Initially held all home antihypertensive regimen including AV blocker medications. On Eliquis for anticoagulation. Now tachycardic with stable BP, restarted metoprolol at lower dose of 12.55m bid with hold parameters  Chronic diastolic heart failure ([facility] Essential hypertension. Volume status appears adequate. Although at risk for worsening of lower extremity edema as she is  receiving IV fluid. Home regimen includes 20 mg torsemide daily, 25 mg Lopressor twice daily, lisinopril 2.5 mg daily, hydralazine 25 mg 3 times daily, on hold at admit -resumed lower dose metoprolol per above  Depression Cont home Pamelor and Prozac.  Hyperlipidemia Continuing statin.  Hypothyroidism Continue Synthroid.  TSH 6.8, free T4 1.09.        Subjective: Seen eating breakfast. Still feeling thirsty. Tolerating PO, denies nausea  Physical Exam: Vitals:   12/10/21 0325 12/10/21 0847 12/10/21 1030 12/10/21 1143  BP: (!) 117/55 (!) 128/48  (!) 147/57  Pulse: 87 (!) 115  (!) 104  Resp: _0 Temp: 98.7 F (37.1 C) 98.2 F (36.8 C)  97.8  F (36.6 C)  TempSrc: Oral Oral  Oral  SpO2: 99% 100%  100%  Weight:       Height:       General exam: Awake, laying in bed, in nad Respiratory system: Normal respiratory effort, no wheezing Cardiovascular system: regular rate, s1, s2 Gastrointestinal system: Soft, nondistended, positive BS Central nervous system: CN2-12 grossly intact, strength intact Extremities: Perfused, no clubbing Skin: Normal skin turgor, no notable skin lesions seen Psychiatry: Mood normal // no visual hallucinations   Data Reviewed:  Labs reviewed: Cr 1.93, K 4.5, WBC 9.1, Hgb 9.2  Family Communication: Pt in room, family not at bedside  Disposition: Status is: Inpatient Remains inpatient appropriate because: Severity of illness  Planned Discharge Destination: Home with Home Health    Author: Marylu Lund, MD 12/10/2021 1:22 PM  For on call review [email or web address].

## 2021-12-10 NOTE — Progress Notes (Signed)
Received phone call from [facility] that patient is agitated with transporter coming to transport patient to [facility].  Came to bedside to assess patient, she is currently aggressive, combative and yelling at this RN regarding taking medications. Currently refusing medications. Remains without IV access as VAS/IV team came to bedside and patient refused.  Dr. Wyline Copas notified of patient's aggression and combative behavior and refusal of IV access and above information.  Dr. Earlie Counts stated via secure chat to hold off for today and reassess tomorrow 5/25.

## 2021-12-11 ENCOUNTER — Inpatient Hospital Stay (HOSPITAL_COMMUNITY): Payer: HMO

## 2021-12-11 DIAGNOSIS — R609 Edema, unspecified: Secondary | ICD-10-CM

## 2021-12-11 DIAGNOSIS — Z515 Encounter for palliative care: Secondary | ICD-10-CM

## 2021-12-11 DIAGNOSIS — G9341 Metabolic encephalopathy: Secondary | ICD-10-CM

## 2021-12-11 DIAGNOSIS — R413 Other amnesia: Secondary | ICD-10-CM

## 2021-12-11 DIAGNOSIS — R55 Syncope and collapse: Secondary | ICD-10-CM | POA: Diagnosis not present

## 2021-12-11 DIAGNOSIS — D696 Thrombocytopenia, unspecified: Secondary | ICD-10-CM

## 2021-12-11 DIAGNOSIS — Z7189 Other specified counseling: Secondary | ICD-10-CM

## 2021-12-11 DIAGNOSIS — E872 Acidosis, unspecified: Secondary | ICD-10-CM

## 2021-12-11 DIAGNOSIS — D649 Anemia, unspecified: Secondary | ICD-10-CM

## 2021-12-11 LAB — COMPREHENSIVE METABOLIC PANEL
ALT: 21 U/L (ref 0–44)
AST: 24 U/L (ref 15–41)
Albumin: 2.4 g/dL — ABNORMAL LOW (ref 3.5–5.0)
Alkaline Phosphatase: 56 U/L (ref 38–126)
Anion gap: 6 (ref 5–15)
BUN: 26 mg/dL — ABNORMAL HIGH (ref 8–23)
CO2: 16 mmol/L — ABNORMAL LOW (ref 22–32)
Calcium: 8.2 mg/dL — ABNORMAL LOW (ref 8.9–10.3)
Chloride: 118 mmol/L — ABNORMAL HIGH (ref 98–111)
Creatinine, Ser: 1.58 mg/dL — ABNORMAL HIGH (ref 0.44–1.00)
GFR, Estimated: 32 mL/min — ABNORMAL LOW (ref >60)
Glucose, Bld: 141 mg/dL — ABNORMAL HIGH (ref 70–99)
Potassium: 3.9 mmol/L (ref 3.5–5.1)
Sodium: 140 mmol/L (ref 135–145)
Total Bilirubin: 0.2 mg/dL — ABNORMAL LOW (ref 0.3–1.2)
Total Protein: 4.7 g/dL — ABNORMAL LOW (ref 6.5–8.1)

## 2021-12-11 LAB — URINALYSIS, ROUTINE W REFLEX MICROSCOPIC
Bilirubin Urine: NEGATIVE
Glucose, UA: NEGATIVE mg/dL
Hgb urine dipstick: NEGATIVE
Ketones, ur: NEGATIVE mg/dL
Leukocytes,Ua: NEGATIVE
Nitrite: NEGATIVE
Protein, ur: NEGATIVE mg/dL
Specific Gravity, Urine: 1.014 (ref 1.005–1.030)
pH: 5 (ref 5.0–8.0)

## 2021-12-11 LAB — CBC
HCT: 24.2 % — ABNORMAL LOW (ref 36.0–46.0)
Hemoglobin: 7.7 g/dL — ABNORMAL LOW (ref 12.0–15.0)
MCH: 31.4 pg (ref 26.0–34.0)
MCHC: 31.8 g/dL (ref 30.0–36.0)
MCV: 98.8 fL (ref 80.0–100.0)
Platelets: 146 10*3/uL — ABNORMAL LOW (ref 150–400)
RBC: 2.45 MIL/uL — ABNORMAL LOW (ref 3.87–5.11)
RDW: 16.3 % — ABNORMAL HIGH (ref 11.5–15.5)
WBC: 7.4 10*3/uL (ref 4.0–10.5)
nRBC: 0 % (ref 0.0–0.2)

## 2021-12-11 LAB — TYPE AND SCREEN
ABO/RH(D): A POS
Antibody Screen: NEGATIVE

## 2021-12-11 LAB — METHYLMALONIC ACID, SERUM: Methylmalonic Acid, Quantitative: 127 nmol/L (ref 0–378)

## 2021-12-11 LAB — ABO/RH: ABO/RH(D): A POS

## 2021-12-11 LAB — MAGNESIUM: Magnesium: 1.7 mg/dL (ref 1.7–2.4)

## 2021-12-11 LAB — HEMOGLOBIN AND HEMATOCRIT, BLOOD
HCT: 27.9 % — ABNORMAL LOW (ref 36.0–46.0)
Hemoglobin: 8.9 g/dL — ABNORMAL LOW (ref 12.0–15.0)

## 2021-12-11 LAB — CALCIUM, IONIZED: Calcium, Ionized, Serum: 4.8 mg/dL (ref 4.5–5.6)

## 2021-12-11 MED ORDER — METOPROLOL TARTRATE 5 MG/5ML IV SOLN: 2.5000 mg | Freq: Four times a day (QID) | INTRAVENOUS | Status: DC | PRN

## 2021-12-11 MED ORDER — ENOXAPARIN SODIUM 80 MG/0.8ML IJ SOSY
1.0000 mg/kg | PREFILLED_SYRINGE | INTRAMUSCULAR | Status: DC
  Administered 2021-12-11: 75 mg via SUBCUTANEOUS
  Filled 2021-12-11: qty 0.8

## 2021-12-11 NOTE — Progress Notes (Signed)
[place] Patient's daughter [name] is at bedside requesting palliative care consultation. Her brother [name] is POW and his number is [phone]. MD messaged.

## 2021-12-11 NOTE — Progress Notes (Addendum)
1045 Patient refused to take any meds by mouth nor eat breakfast or drink anything. Patient's son Valerie Mcclure [phone]) [phone] called to state that this kind of "mental decline" has happened in the past with UTIs and that she normally "bounces back" from it. He is hoping that she returns to baseline. FYI communicated to case manager and MD.  1050 Patient turned in bed for linen change and sacral dressing change.   1115 Patient was turned and sacral dressing changed. Patient began to form intelligible words and she recognized her daughter Valerie Mcclure.

## 2021-12-11 NOTE — Progress Notes (Signed)
ANTICOAGULATION CONSULT NOTE - Initial Consult  Pharmacy Consult for Lovenox Indication: atrial fibrillation  Allergies  Allergen Reactions   Shrimp [Shellfish Allergy] Anaphylaxis and Rash    Break outs and swelling of the throat   Valsartan Other (See Comments)    Renal failure   Tandem Plus [Fefum-Fepo-Fa-B Cmp-C-Zn-Mn-Cu] Nausea And Vomiting   Ivp Dye [Iodinated Contrast Media] Rash    itching   Penicillins Itching and Rash    Has patient had a PCN reaction causing immediate rash, facial/tongue/throat swelling, SOB or lightheadedness with hypotension: Yes Has patient had a PCN reaction causing severe rash involving mucus membranes or skin necrosis: No Has patient had a PCN reaction that required hospitalization: No Has patient had a PCN reaction occurring within the last 10 years: No If all of the above answers are "NO", then may proceed with Cephalosporin use.     Patient Measurements: Height: _0  (167.6 cm) Weight: 76.1 kg (167 lb 12.3 oz) IBW/kg (Calculated) : 59.3  Vital Signs: Temp: 97.9 F (36.6 C) (05/25 1113) Temp Source: Axillary (05/25 1113) BP: 141/73 (05/25 1113) Pulse Rate: 80 (05/25 1113)  Labs: Recent Labs    12/08/21 1628 12/08/21 1629 12/09/21 0409 12/10/21 0252 12/11/21 0215  HGB 11.4*   < > 10.1* 9.2* 7.7*  HCT 35.8*   < > 32.2* 28.8* 24.2*  PLT 204  --  166 156 146*  APTT 30  --   --   --   --   LABPROT 17.4*  --   --   --   --   INR 1.4*  --   --   --   --   CREATININE 2.61*   < > 2.16* 1.93* 1.58*  CKTOTAL  --   --  55  --   --    < > = values in this interval not displayed.    Estimated Creatinine Clearance: 28.6 mL/min (A) (by C-G formula based on SCr of 1.58 mg/dL (H)).   Medical History: Past Medical History:  Diagnosis Date   Blood transfusion without reported diagnosis    Chronic low back pain    History of echocardiogram    Echo 5/18: EF 55-60, Gr 2 DD, mild MR, mild LAE, PASP 47 // Echo 1/18:  EF 55, mild AI, MAC, mild  MR, mild LAE, trivial pericardial effusion   History of ischemic left MCA stroke 11/2016   left MCA infarct status post mechanical thrombectomy complicated by large left basal ganglia hemorrhage with right shift.   HTN (hypertension)    Hyperlipidemia    Hypothyroidism    Osteopenia    Persistent atrial fibrillation ([name])    CHADS2-VASc=6 (female, age 81, HTN, CVA) // Apixaban // rate control strategy    Sacral wound    chronic sacral wound    Assessment: 54 yof on apixaban PTA for Afib and unable to take oral medication currently. Pharmacy consulted to transition to Lovenox.  CKD IV noted, SCr 1.58 (baseline ~1.8), and CrCl <30 ml/min. No overt bleeding noted, Hgb down to 7.7, platelets trending down - will need to watch this closely.   Goal of Therapy:  Anti-Xa level 1-2 units/ml 4 hrs after Lovenox dose Monitor platelets by anticoagulation protocol: Yes   Plan:  Lovenox 75 mg SQ q24h CBC q72h while on Lovenox Monitor for s/sx of bleeding F/U ability to resume apixaban  Thank you for involving pharmacy in this patient's care.  Renold Genta, PharmD, BCPS Clinical Pharmacist Clinical phone for 12/11/2021  until 3p is [phone] 12/11/2021 11:19 AM  **Pharmacist phone directory can be found on [email or web address] listed under [place]**

## 2021-12-11 NOTE — Plan of Care (Signed)
Pt resting. Sitter present at bedside, pt daughter present at bedside. Pt is RA. IV fluids continued per order. Pt took medications, no complications noted. Dressing changed to right buttock. Pt has been turned and repositioned. Pt's daughter also present at bedside. Call button within reach.    Problem: Education: Goal: Knowledge of General Education information will improve Description: Including pain rating scale, medication(s)/side effects and non-pharmacologic comfort measures Outcome: Progressing   Problem: Health Behavior/Discharge Planning: Goal: Ability to manage health-related needs will improve Outcome: Progressing   Problem: Clinical Measurements: Goal: Ability to maintain clinical measurements within normal limits will improve Outcome: Progressing Goal: Will remain free from infection Outcome: Progressing Goal: Diagnostic test results will improve Outcome: Progressing Goal: Respiratory complications will improve Outcome: Progressing Goal: Cardiovascular complication will be avoided Outcome: Progressing   Problem: Activity: Goal: Risk for activity intolerance will decrease Outcome: Progressing   Problem: Nutrition: Goal: Adequate nutrition will be maintained Outcome: Progressing   Problem: Coping: Goal: Level of anxiety will decrease Outcome: Progressing   Problem: Elimination: Goal: Will not experience complications related to bowel motility Outcome: Progressing Goal: Will not experience complications related to urinary retention Outcome: Progressing   Problem: Pain Managment: Goal: General experience of comfort will improve Outcome: Progressing   Problem: Safety: Goal: Ability to remain free from injury will improve Outcome: Progressing   Problem: Skin Integrity: Goal: Risk for impaired skin integrity will decrease Outcome: Progressing   Problem: Education: Goal: Knowledge of disease or condition will improve Outcome: Progressing Goal: Knowledge  of secondary prevention will improve (SELECT ALL) Outcome: Progressing Goal: Knowledge of patient specific risk factors will improve (INDIVIDUALIZE FOR PATIENT) Outcome: Progressing

## 2021-12-11 NOTE — Consult Note (Signed)
Consultation Note Date: 12/11/2021   Patient Name: Valerie Mcclure  DOB: March 02, 1941  MRN: [id number]  Age / Sex: 81 y.o., female  PCP: [name], MD Referring Physician: Antonieta Pert, MD  Reason for Consultation: Establishing goals of care  HPI/Patient Profile: 81 y.o. female  with past medical history of  A-fib on Eliquis, chronic HFpEF, HTN, HLD, hypothyroidism, and CKD 4 admitted on 12/08/2021 with episode of unwitnessed unresponsive episode at home.  Apparently patient was found unresponsive in her bathroom with a heart rate of 30 and her right arm was contractured.  Chest x-ray negative, head CT with no acute changes, MRI negative, and UA negative.  Symptoms thought to be related to orthostatic hypotension.  Per family, patient initially responded well to fluids with improvement mental status but throughout mental status hospitalization has declined; delirium suspected.  Family has requested repeat UA.  PMT consulted to discuss goals of care.  Clinical Assessment and Goals of Care: I have reviewed medical records including EPIC notes, labs and imaging, received report from nurse, assessed the patient and then met with daughter to discuss diagnosis prognosis, GOC, EOL wishes, disposition and options.  Patient unable to participate in goals of care discussions due to altered mental status.  She is able to tell me she is not in pain.  Nurse and daughter reported patient has not eaten today.  She has a food tray at bedside and I attempted to encourage her to eat however she declined.  She is unable to specify why just that she does not want to eat.  I introduced Palliative Medicine as specialized medical care for people living with serious illness. It focuses on providing relief from the symptoms and stress of a serious illness. The goal is to improve quality of life for both the patient and the family.  Daughter tells me she lives with patient.   She initially reports patient was fully functional at home but as we discussed more she does share that patient has had decline over the past month and has essentially been wheelchair-bound and needing assistance with all ADLs.  Daughter also reports fluctuating appetite.  Daughter tells me patient is mostly cognitively intact but also shares that patient sometimes forgets her spouses passed away though he passed away 5 months ago.   We discussed patient's current illness and what it means in the larger context of patient's on-going co-morbidities.  We discussed patient's likely syncopal episode related to orthostatic hypotension.  We discussed thorough medical work-up is mostly negative.  We discussed repeating urinalysis.  We discussed delirium causes and strategies to help.  We discussed allowing time for outcomes.  I attempted to elicit values and goals of care important to the patient.    Discussed with daughter the importance of continued conversation with family and the medical providers regarding overall plan of care and treatment options, ensuring decisions are within the context of the patients values and GOCs.    Encouraged family to consider DNR/DNI status understanding evidenced based poor outcomes in similar hospitalized patients, as the cause of the arrest is likely associated with chronic/terminal disease rather than a reversible acute cardio-pulmonary event.  Daughter agrees that DNR is likely appropriate and shares that she thinks patient would also elect DNR status.  Patient's son Gwynneth Macleod is her documented healthcare power of attorney so following meeting with daughter I discussed the above with him over the phone.  We also reviewed patient's care and medical work-up to this point.  We  also discussed CODE STATUS and he also seems to agree that DNR is appropriate and would be in line with goals of care but he request time to speak to other family members before agreeing to DNR.  Family is  open to follow-up tomorrow.  Questions and concerns were addressed. The family was encouraged to call with questions or concerns.  Primary Decision Maker HCPOA -son Wes    SUMMARY OF RECOMMENDATIONS   Initial goals of care discussion with patient's daughter and son Son is Optician, dispensing and is documented HCPOA on file in epic Family agreeable to current treatment and to allow time for outcomes Family seems to agree DNR would be appropriate for patient but request time to discuss with other family members -tells me they will likely have decision this evening Discussed delirium precautions with family Standard delirium management (adapted from NICE guidelines 2011 for prevention of delirium):  Provide continuity of care when possible (avoid frequent changing of surroundings and staff).  Frequent reorientation to time with:  A clock should always be visible.  Make sure Calendar/white board is updated.  Lights on/blinds open during the day and off/closed at night.  Encourage frequent family visits.  Monitor for and treat dehydration/constipation.  Optimize oxygen saturation.  Avoid catheters and IV's when possible and look for/treat infections.  Encourage early mobility.  Assess and treat pain  Ensure adequate nutrition and functioning dentures.  Address reversible causes of hearing and visual impairment:  Use pocket talker if hearing aids are unavailable.  Avoid sleep disturbance (normalize sleep/wake cycle).  Minimize disturbances and consider NOT obtaining vitals at night if possible.  Review Medications to avoid polypharmacy and avoid deliriogenic medications when possible:  Benzodiazepines (patient on benzos long term) Dihydropyridines.  Antihistamines.  Anticholinergics   (Possibly avoid: H2 blockers, tricyclic antidepressants, antiparkinson medications, steroids, NSAID's).   Code Status/Advance Care Planning: Full code - family likely to change to DNR      Primary  Diagnoses: Present on Admission:  Acute renal failure superimposed on stage 4 chronic kidney disease ([name])  Bradycardia  Chronic diastolic heart failure ([name])  Hyperlipidemia  Hypothyroidism  Syncope and collapse  Persistent atrial fibrillation ([name])  Depression  Bilateral edema of lower extremity  Stroke-like symptom  SIRS (systemic inflammatory response syndrome) ([name])  Orthostatic hypotension   I have reviewed the medical record, interviewed the patient and family, and examined the patient. The following aspects are pertinent.  Past Medical History:  Diagnosis Date   Blood transfusion without reported diagnosis    Chronic low back pain    History of echocardiogram    Echo 5/18: EF 55-60, Gr 2 DD, mild MR, mild LAE, PASP 47 // Echo 1/18:  EF 55, mild AI, MAC, mild MR, mild LAE, trivial pericardial effusion   History of ischemic left MCA stroke 11/2016   left MCA infarct status post mechanical thrombectomy complicated by large left basal ganglia hemorrhage with right shift.   HTN (hypertension)    Hyperlipidemia    Hypothyroidism    Osteopenia    Persistent atrial fibrillation ([name])    CHADS2-VASc=6 (female, age 20, HTN, CVA) // Apixaban // rate control strategy    Sacral wound    chronic sacral wound   Social History   Socioeconomic History   Marital status: Married    Spouse name: Not on file   Number of children: 1   Years of education: Not on file   Highest education level: Not on file  Occupational History  Occupation: retired  Tobacco Use   Smoking status: Never   Smokeless tobacco: Never  Vaping Use   Vaping Use: Never used  Substance and Sexual Activity   Alcohol use: No   Drug use: No   Sexual activity: Not Currently  Other Topics Concern   Not on file  Social History Narrative   Married   Social Determinants of Health   Financial Resource Strain: Not on file  Food Insecurity: No Food Insecurity   Worried About Charity fundraiser in the Last  Year: Never true   [place] in the Last Year: Never true  Transportation Needs: No Transportation Needs   Lack of Transportation (Medical): No   Lack of Transportation (Non-Medical): No  Physical Activity: Not on file  Stress: Not on file  Social Connections: Not on file   Family History  Problem Relation Age of Onset   Heart disease Father 89       MI age 68s   Lung cancer Brother 27   Colon cancer Neg Hx    Esophageal cancer Neg Hx    Rectal cancer Neg Hx    Stomach cancer Neg Hx    Scheduled Meds:  atorvastatin  20 mg Oral Daily   enoxaparin (LOVENOX) injection  1 mg/kg Subcutaneous Q24H   FLUoxetine  40 mg Oral Daily   levothyroxine  75 mcg Oral Q0600   mupirocin ointment  1 application. Topical BID   nortriptyline  40 mg Oral QHS   Continuous Infusions:  cefTRIAXone (ROCEPHIN)  IV Stopped (12/10/21 2238)   lactated ringers 75 mL/hr at 12/11/21 1033   PRN Meds:.acetaminophen **OR** acetaminophen, ALPRAZolam, metoprolol tartrate Allergies  Allergen Reactions   Shrimp [Shellfish Allergy] Anaphylaxis and Rash    Break outs and swelling of the throat   Valsartan Other (See Comments)    Renal failure   Tandem Plus [Fefum-Fepo-Fa-B Cmp-C-Zn-Mn-Cu] Nausea And Vomiting   Ivp Dye [Iodinated Contrast Media] Rash    itching   Penicillins Itching and Rash    Has patient had a PCN reaction causing immediate rash, facial/tongue/throat swelling, SOB or lightheadedness with hypotension: Yes Has patient had a PCN reaction causing severe rash involving mucus membranes or skin necrosis: No Has patient had a PCN reaction that required hospitalization: No Has patient had a PCN reaction occurring within the last 10 years: No If all of the above answers are "NO", then may proceed with Cephalosporin use.    Review of Systems  Unable to perform ROS: Mental status change   Physical Exam Constitutional:      General: She is not in acute distress.    Appearance: She is  ill-appearing.     Comments: lethargic  Pulmonary:     Effort: Pulmonary effort is normal.  Skin:    General: Skin is warm and dry.  Neurological:     Mental Status: She is disoriented.    Vital Signs: BP (!) 141/73 (BP Location: Left Arm)   Pulse 80   Temp 97.9 F (36.6 C) (Axillary)   Resp 15   Ht _0  (1.676 m)   Wt 76.1 kg   SpO2 98%   BMI 27.08 kg/m  Pain Scale: PAINAD POSS *See Group Information*: 2-Acceptable,Slightly drowsy, easily aroused Pain Score: 0-No pain   SpO2: SpO2: 98 % O2 Device:SpO2: 98 % O2 Flow Rate: .   IO: Intake/output summary:  Intake/Output Summary (Last 24 hours) at 12/11/2021 1527 Last data filed at 12/11/2021 1456 Gross per  24 hour  Intake 739.3 ml  Output 150 ml  Net 589.3 ml    LBM: Last BM Date : 12/08/21 Baseline Weight: Weight: 73.4 kg Most recent weight: Weight: 76.1 kg     Palliative Assessment/Data: PPS 20% d/t PO intake    *Please note that this is a verbal dictation therefore any spelling or grammatical errors are due to the "[place] [employer]" system interpretation.  Juel Burrow, DNP, AGNP-C Palliative Medicine Team [phone] Pager: [phone]

## 2021-12-11 NOTE — Progress Notes (Signed)
SLP Cancellation Note  Patient Details Name: Valerie Mcclure MRN: [id number] DOB: 1941-04-06   Cancelled treatment:       Reason Eval/Treat Not Completed: Other (comment);Fatigue/lethargy limiting ability to participate Per chart review, daughter asking for palliative consult.  Pt has sitter in room currently.  Kathleen Lime, MS [facility] SLP Acute Rehab Services Office [phone] Pager [phone]    Macario Golds 12/11/2021, 10:30 AM

## 2021-12-11 NOTE — Plan of Care (Signed)
  Problem: Education: Goal: Knowledge of General Education information will improve Description: Including pain rating scale, medication(s)/side effects and non-pharmacologic comfort measures 12/11/2021 1630 by Hulen Luster, RN Outcome: Progressing 12/11/2021 1411 by Hulen Luster, RN Outcome: Progressing   Problem: Health Behavior/Discharge Planning: Goal: Ability to manage health-related needs will improve 12/11/2021 1630 by Hulen Luster, RN Outcome: Progressing 12/11/2021 1411 by Hulen Luster, RN Outcome: Progressing   Problem: Clinical Measurements: Goal: Ability to maintain clinical measurements within normal limits will improve 12/11/2021 1630 by Hulen Luster, RN Outcome: Progressing 12/11/2021 1411 by Hulen Luster, RN Outcome: Progressing Goal: Will remain free from infection 12/11/2021 1630 by Hulen Luster, RN Outcome: Progressing 12/11/2021 1411 by Hulen Luster, RN Outcome: Progressing Goal: Diagnostic test results will improve 12/11/2021 1630 by Hulen Luster, RN Outcome: Progressing 12/11/2021 1411 by Hulen Luster, RN Outcome: Progressing Goal: Respiratory complications will improve 12/11/2021 1630 by Hulen Luster, RN Outcome: Progressing 12/11/2021 1411 by Hulen Luster, RN Outcome: Progressing Goal: Cardiovascular complication will be avoided 12/11/2021 1630 by Hulen Luster, RN Outcome: Progressing 12/11/2021 1411 by Hulen Luster, RN Outcome: Progressing   Problem: Activity: Goal: Risk for activity intolerance will decrease 12/11/2021 1630 by Hulen Luster, RN Outcome: Progressing 12/11/2021 1411 by Hulen Luster, RN Outcome: Progressing   Problem: Nutrition: Goal: Adequate nutrition will be maintained 12/11/2021 1630 by Hulen Luster, RN Outcome: Progressing 12/11/2021 1411 by Hulen Luster, RN Outcome: Progressing   Problem: Coping: Goal: Level of anxiety will decrease 12/11/2021 1630 by Hulen Luster, RN Outcome: Progressing 12/11/2021 1411 by Hulen Luster, RN Outcome: Progressing    Problem: Elimination: Goal: Will not experience complications related to bowel motility 12/11/2021 1630 by Hulen Luster, RN Outcome: Progressing 12/11/2021 1411 by Hulen Luster, RN Outcome: Progressing Goal: Will not experience complications related to urinary retention 12/11/2021 1630 by Hulen Luster, RN Outcome: Progressing 12/11/2021 1411 by Hulen Luster, RN Outcome: Progressing   Problem: Pain Managment: Goal: General experience of comfort will improve 12/11/2021 1630 by Hulen Luster, RN Outcome: Progressing 12/11/2021 1411 by Hulen Luster, RN Outcome: Progressing   Problem: Safety: Goal: Ability to remain free from injury will improve 12/11/2021 1630 by Hulen Luster, RN Outcome: Progressing 12/11/2021 1411 by Hulen Luster, RN Outcome: Progressing   Problem: Skin Integrity: Goal: Risk for impaired skin integrity will decrease 12/11/2021 1630 by Hulen Luster, RN Outcome: Progressing 12/11/2021 1411 by Hulen Luster, RN Outcome: Progressing   Problem: Education: Goal: Knowledge of disease or condition will improve Outcome: Progressing Goal: Knowledge of secondary prevention will improve (SELECT ALL) Outcome: Progressing Goal: Knowledge of patient specific risk factors will improve (INDIVIDUALIZE FOR PATIENT) Outcome: Progressing

## 2021-12-11 NOTE — Progress Notes (Signed)
PROGRESS NOTE Valerie Mcclure  VFI:[id number] DOB: 06/21/1941 DOA: 12/08/2021 PCP: [name], MD   Brief Narrative/Hospital Course: 81 year old female with history of persistent A-fib on Eliquis, chronic HFpEF, HTN, HLD, hypothyroidism, CKD 4 presented with an episode of unwitnessed unresponsive episode at home.  Apparently patient was found unresponsive in her bathroom with a heart rate of 30 and her right arm was contractured.  Patient was seen in the ED-EKG with A-fib rate controlled, labs showed AKI hypoalbuminemia, anemia.  Chest x-ray no active disease, CT head code stroke no acute finding, redemonstration of chronic left MCA territory infarct within the left temporal lobe insula and basal ganglia chronic small vessel changes, neurology was consulted, patient was admitted for further management.       Subjective: Seen and examined this morning.  Daughter at the bedside patient lethargic somnolent able to briefly wake up and tell her name but mostly mumbling words.  Mitten in place. One-to-one safety sitter at the bedside, has been pulling out IV line, refusing oral medication. Patient's son endorses similar decline in mental status whenever she has UTI.  Daughter at the bedside reports patient has cognitive/memory problems but normally interactive ambulatory at home Overnight afebrile, BP stable, on room air Labs this morning with improved WBC count hemoglobin downtrending 7.7 g creatinine better 1.5 bicarb 16  Assessment and Plan: Principal Problem:   Syncope and collapse Active Problems:   Orthostatic hypotension   Stroke-like symptom   SIRS (systemic inflammatory response syndrome) ([name])   Bilateral edema of lower extremity   Acute renal failure superimposed on stage 4 chronic kidney disease ([name])   Chronic diastolic heart failure ([name])   Bradycardia   Persistent atrial fibrillation ([name])   Depression   Hypothyroidism   Hyperlipidemia   Metabolic acidosis   Normocytic  anemia   Thrombocytopenia ([name])   Assessment and Plan:  Syncope and collapse Orthostatic hypotension: Presents with an unwitnessed fall per daughter she was talking with her when that happened in the bathroom but she did not witness this likely loss of consciousness.  Completed code stroke work-up.  Continue IV fluids, PT OT.  Stroke-like symptom: Code stroke work up done with CT head in ED. MRI brain/MR angio head-no evidence of acute or subacute stroke, mildly limited by motion.Echo EF 65 to 70%, indeterminate diastolic parameters, normal RV function, mitral aortic valve grossly normal moderate AR.Carotid duplex unremarkable.  Acute metabolic encephalopathy suspect delirium Baseline memory deficit/cognitive impairment: Patient with delirium, so far infectious etiology unremarkable repeat UA.  MRI brain no acute finding.  Mostly hypotensive orthostatic and component of dehydration AKI.  Continue IV fluid hydration supportive care for progressive delirium precaution regulate sleep cycle.  Minimize sedative. PT OT speech consulted, speech recommended dysphagia 3 diet. Patient has been confused not directable/aggressive with the staff.  SIRS: Sepsis ruled out. blood culture ordered sent so far negative, UA  cxr 5/22- no e/o infection.  Remains afebrile leukocytosis initially mild and now resolved, patient on ceftriaxone..  Family endorses similar presentation when patient has UTI repeat UA  Chronic lower extremity swelling per daughter, duplex/ABI ordered but patient refused suspect low yield.  Given dose of IV albumin  Anemia noted drop in hemoglobin check Hemoccult, repeat H&H later tonight with type and screen.  Appears to have chronic component of anemia. Of note she is on anticoagulants for A-fib, Recent Labs  Lab 12/08/21 1628 12/08/21 1629 12/09/21 0409 12/10/21 0252 12/11/21 0215  HGB 11.4* 11.9* 10.1* 9.2* 7.7*  HCT  35.8* 35.0* 32.2* 28.8* 24.2*    AKI on CKD stage IV baseline  creatinine 1.8- egfr 09.6 Metabolic acidosis: Likely due to dehydration hypotension.  Creatinine now downtrending nicely.  Continue IV fluids encourage p.o. intake.  Monitor bicarb level. Recent Labs  Lab 12/08/21 1628 12/08/21 1629 12/09/21 0409 12/10/21 0252 12/11/21 0215  BUN 67* 68* 55* 40* 26*  CREATININE 2.61* 2.70* 2.16* 1.93* 1.58*    Persistent atrial fibrillation : Reportedly heart rate in 30s when the event happened, initially antihypertensive and a blocker medication held.  Patient had been tachycardic and metoprolol resumed. On Eliquis rate controlled on metoprolol switch to IV prn as unable to take p.o.    Chronic diastolic heart failure: Essential hypertension: Some hypovolemia on admission needing gentle IV fluid hydration.  Normally on torsemide 2025 Lopressor lisinopril 2.5 hydralazine 25 3 times daily currently on hold.    Depression continue home meds HLD continue statin Hypothyroidism euthyroid TSH 6.8 Free T41.09 Continue Synthroid  Goals of care: Full code, requesting palliative care consultation by family.  Discussed with daughter and agreeable.  DVT prophylaxis: apixaban (ELIQUIS) tablet 2.5 mg Start: 12/08/21 2200 SCDs Start: 12/08/21 1939 Code Status:   Code Status: Full Code Family Communication: plan of care discussed with patient/daughter at bedside. Patient status is: Inpatient because of ongoing altered mental status/delirium Level of care: Telemetry Medical   Dispo: The patient is from: Home            Anticipated disposition: TBD  Mobility Assessment (last 72 hours)     Mobility Assessment     Row Name 12/11/21 0845 12/11/21 0407 12/11/21 0010 12/10/21 1940 12/10/21 1901   Does patient have an order for bedrest or is patient medically unstable No - Continue assessment No - Continue assessment No - Continue assessment No - Continue assessment No - Continue assessment   What is the highest level of mobility based on the progressive mobility  assessment? Level 2 (Chairfast) - Balance while sitting on edge of bed and cannot stand Level 3 (Stands with assist) - Balance while standing  and cannot march in place Level 3 (Stands with assist) - Balance while standing  and cannot march in place Level 3 (Stands with assist) - Balance while standing  and cannot march in place Level 3 (Stands with assist) - Balance while standing  and cannot march in place   Is the above level different from baseline mobility prior to current illness? Yes - Recommend PT order Yes - Recommend PT order Yes - Recommend PT order Yes - Recommend PT order Yes - Recommend PT order    [place] Name 12/10/21 1900 12/10/21 1625 12/10/21 0830 12/10/21 0021 12/09/21 1947   Does patient have an order for bedrest or is patient medically unstable No - Continue assessment -- No - Continue assessment No - Continue assessment No - Continue assessment   What is the highest level of mobility based on the progressive mobility assessment? Level 3 (Stands with assist) - Balance while standing  and cannot march in place Level 2 (Chairfast) - Balance while sitting on edge of bed and cannot stand Level 2 (Chairfast) - Balance while sitting on edge of bed and cannot stand Level 3 (Stands with assist) - Balance while standing  and cannot march in place Level 3 (Stands with assist) - Balance while standing  and cannot march in place   Is the above level different from baseline mobility prior to current illness? Yes - Recommend PT order --  Yes - Recommend PT order Yes - Recommend PT order Yes - Recommend PT order    [place] Name 12/09/21 1100 12/09/21 1042 12/09/21 0800 12/09/21 0745 12/09/21 0129   Does patient have an order for bedrest or is patient medically unstable -- -- No - Continue assessment No - Continue assessment No - Continue assessment   What is the highest level of mobility based on the progressive mobility assessment? Level 3 (Stands with assist) - Balance while standing  and cannot march in  place Level 3 (Stands with assist) - Balance while standing  and cannot march in place Level 3 (Stands with assist) - Balance while standing  and cannot march in place Level 3 (Stands with assist) - Balance while standing  and cannot march in place Level 3 (Stands with assist) - Balance while standing  and cannot march in place   Is the above level different from baseline mobility prior to current illness? -- -- Yes - Recommend PT order Yes - Recommend PT order Yes - Recommend PT order             Objective: Vitals last 24 hrs: Vitals:   12/10/21 2004 12/10/21 2329 12/11/21 0316 12/11/21 0727  BP: 117/72 132/86 (!) 138/105 (!) 124/57  Pulse: 64 96 100 85  Resp: $Remo'16 18 17 'bCscI$ (!) 21  Temp: 98.6 F (37 C) 98.8 F (37.1 C) 98.1 F (36.7 C) 98.5 F (36.9 C)  TempSrc: Axillary Oral Axillary Axillary  SpO2: 98% 99% 100% 100%  Weight:      Height:       Weight change:   Physical Examination: General exam: Lethargic sleepy briefly wakes up to tell her name mumbles words HEENT:Oral mucosa moist, Ear/Nose WNL grossly, dentition normal. Respiratory system: bilaterally diminished BS, no use of accessory muscle Cardiovascular system: S1 & S2 +, No JVD. Gastrointestinal system: Abdomen soft,NT,ND, BS+ Nervous System: Lethargic, MITS + Extremities: LE edema chronic appearing, warm and pulses distal extremities Skin: No rashes,no icterus. MSK: Normal muscle bulk,tone, power  Medications reviewed:  Scheduled Meds:  apixaban  2.5 mg Oral BID   atorvastatin  20 mg Oral Daily   FLUoxetine  40 mg Oral Daily   levothyroxine  75 mcg Oral Q0600   metoprolol tartrate  12.5 mg Oral BID   mupirocin ointment  1 application. Topical BID   nortriptyline  40 mg Oral QHS   Continuous Infusions:  cefTRIAXone (ROCEPHIN)  IV Stopped (12/10/21 2238)   lactated ringers 75 mL/hr at 12/11/21 1033     Diet Order             DIET DYS 3 Room service appropriate? Yes; Fluid consistency: Thin  Diet effective  now                            Intake/Output Summary (Last 24 hours) at 12/11/2021 1100 Last data filed at 12/11/2021 0900 Gross per 24 hour  Intake 739.3 ml  Output 450 ml  Net 289.3 ml   Net IO Since Admission: 5,423.25 mL [12/11/21 1100]  Wt Readings from Last 3 Encounters:  12/09/21 76.1 kg  10/13/21 77.6 kg  06/18/21 73.9 kg     Unresulted Labs (From admission, onward)     Start     Ordered   12/10/21 0500  CBC  Daily,   R     Question:  Specimen collection method  Answer:  Lab=Lab collect   12/09/21 2123  12/10/21 0500  Comprehensive metabolic panel  Daily,   R     Question:  Specimen collection method  Answer:  Lab=Lab collect   12/09/21 2123   12/10/21 0500  Magnesium  Daily,   R     Question:  Specimen collection method  Answer:  Lab=Lab collect   12/09/21 2123          Data Reviewed: I have personally reviewed following labs and imaging studies CBC: Recent Labs  Lab 12/08/21 1628 12/08/21 1629 12/09/21 0409 12/10/21 0252 12/11/21 0215  WBC 10.6*  --  11.3* 9.1 7.4  NEUTROABS 7.9*  --  8.7*  --   --   HGB 11.4* 11.9* 10.1* 9.2* 7.7*  HCT 35.8* 35.0* 32.2* 28.8* 24.2*  MCV 99.4  --  98.5 97.3 98.8  PLT 204  --  166 156 063*   Basic Metabolic Panel: Recent Labs  Lab 12/08/21 1628 12/08/21 1629 12/09/21 0409 12/10/21 0252 12/11/21 0215  NA 137 137 139 139 140  K 4.5 4.6 4.2 4.5 3.9  CL 109 110 114* 116* 118*  CO2 15*  --  15* 16* 16*  GLUCOSE 120* 117* 92 96 141*  BUN 67* 68* 55* 40* 26*  CREATININE 2.61* 2.70* 2.16* 1.93* 1.58*  CALCIUM 9.0  --  8.3* 8.3* 8.2*  MG  --   --  1.7 1.7 1.7   GFR: Estimated Creatinine Clearance: 28.6 mL/min (A) (by C-G formula based on SCr of 1.58 mg/dL (H)). Liver Function Tests: Recent Labs  Lab 12/08/21 1628 12/09/21 0409 12/10/21 0252 12/11/21 0215  AST $Re'25 24 26 24  'FpO$ ALT $R'26 24 23 21  'gO$ ALKPHOS 79 67 67 56  BILITOT 0.4 0.5 0.8 0.2*  PROT 5.8* 5.0* 4.9* 4.7*  ALBUMIN 2.6* 2.2* 2.1* 2.4*    No results for input(s): LIPASE, AMYLASE in the last 168 hours. No results for input(s): AMMONIA in the last 168 hours. Coagulation Profile: Recent Labs  Lab 12/08/21 1628  INR 1.4*   BNP (last 3 results) No results for input(s): PROBNP in the last 8760 hours. HbA1C: No results for input(s): HGBA1C in the last 72 hours. CBG: Recent Labs  Lab 12/08/21 1622  GLUCAP 119*   Lipid Profile: Recent Labs    12/09/21 0409  CHOL 107  HDL 40*  LDLCALC 40  TRIG 135  CHOLHDL 2.7   Thyroid Function Tests: Recent Labs    12/08/21 1808 12/09/21 0409  TSH 6.828*  --   FREET4  --  1.09   Sepsis Labs: Recent Labs  Lab 12/08/21 1628 12/08/21 2042 12/09/21 0409  PROCALCITON <0.10  --   --   LATICACIDVEN  --  1.7 1.2    Recent Results (from the past 240 hour(s))  Culture, blood (single)     Status: None (Preliminary result)   Collection Time: 12/08/21  6:05 PM   Specimen: BLOOD  Result Value Ref Range Status   Specimen Description BLOOD RIGHT ANTECUBITAL  Final   Special Requests   Final    BOTTLES DRAWN AEROBIC AND ANAEROBIC Blood Culture results may not be optimal due to an inadequate volume of blood received in culture bottles   Culture   Final    NO GROWTH 2 DAYS Performed at [facility] [address], [place], [place] [address]    Report Status PENDING  Incomplete    Antimicrobials: Anti-infectives (From admission, onward)    Start     Dose/Rate Route Frequency Ordered Stop  12/10/21 1800  vancomycin (VANCOCIN) IVPB 1000 mg/200 mL premix  Status:  Discontinued        1,000 mg 200 mL/hr over 60 Minutes Intravenous Every 48 hours 12/08/21 1811 12/09/21 0823   12/09/21 1600  cefTRIAXone (ROCEPHIN) 1 g in sodium chloride 0.9 % 100 mL IVPB        1 g 200 mL/hr over 30 Minutes Intravenous Every 24 hours 12/08/21 2028 12/14/21 1559   12/08/21 1800  vancomycin (VANCOREADY) IVPB 1500 mg/300 mL        1,500 mg 150 mL/hr over 120 Minutes Intravenous  Once  12/08/21 1745 12/08/21 2154   12/08/21 1745  vancomycin (VANCOCIN) IVPB 1000 mg/200 mL premix  Status:  Discontinued        1,000 mg 200 mL/hr over 60 Minutes Intravenous  Once 12/08/21 1737 12/08/21 1745   12/08/21 1745  cefTRIAXone (ROCEPHIN) 2 g in sodium chloride 0.9 % 100 mL IVPB        2 g 200 mL/hr over 30 Minutes Intravenous  Once 12/08/21 1737 12/08/21 1844      Culture/Microbiology    Component Value Date/Time   SDES BLOOD RIGHT ANTECUBITAL 12/08/2021 1805   SPECREQUEST  12/08/2021 1805    BOTTLES DRAWN AEROBIC AND ANAEROBIC Blood Culture results may not be optimal due to an inadequate volume of blood received in culture bottles   CULT  12/08/2021 1805    NO GROWTH 2 DAYS Performed at [facility], [place] [address], [place], [place] [address]    REPTSTATUS PENDING 12/08/2021 1805    Other culture-see note  Radiology Studies: DG Abd Portable 1V  Result Date: 12/09/2021 CLINICAL DATA:  Abdominal discomfort. EXAM: PORTABLE ABDOMEN - 1 VIEW COMPARISON:  No abnormal abdominal or pelvic calcifications. FINDINGS: Enteric contrast material opacifies nondilated loops of small bowel. The bowel gas pattern appears nonobstructed. Postop change is again noted within the lower lumbar spine. IMPRESSION: Nonobstructive bowel gas pattern. Electronically Signed   By: Kerby Moors M.D.   On: 12/09/2021 11:49   ECHOCARDIOGRAM COMPLETE  Result Date: 12/09/2021    ECHOCARDIOGRAM REPORT   Patient Name:   GRACY EHLY Date of Exam: 12/09/2021 Medical Rec #:  [id number]           Height:       66.0 in Accession #:    [id number]          Weight:       167.8 lb Date of Birth:  [birth year]           BSA:          1.856 m Patient Age:    41 years            BP:           116/64 mmHg Patient Gender: F                   HR:           81 bpm. Exam Location:  Inpatient Procedure: 2D Echo, Cardiac Doppler and Color Doppler Indications:    Syncope  History:        Patient has prior history of  Echocardiogram examinations, most                 recent 12/02/2016. Stroke, Arrythmias:Atrial Fibrillation; Risk                 Factors:Hypertension and Dyslipidemia.  Sonographer:    [place] Referring  Phys: [id number] [place]  1. Left ventricular ejection fraction, by estimation, is 65 to 70%. The left ventricle has normal function. The left ventricle has no regional wall motion abnormalities. Left ventricular diastolic parameters are indeterminate.  2. Right ventricular systolic function is normal. The right ventricular size is mildly enlarged. There is mildly elevated pulmonary artery systolic pressure. The estimated right ventricular systolic pressure is 79.0 mmHg.  3. Left atrial size was mildly dilated.  4. The mitral valve is grossly normal. No evidence of mitral valve regurgitation.  5. The aortic valve is tricuspid. There is mild thickening of the aortic valve. Aortic valve regurgitation is moderate. No aortic stenosis is present. Aortic regurgitation PHT measures 460 msec.  6. Aortic dilatation noted. There is borderline dilatation of the ascending aorta, measuring 39 mm. Comparison(s): AI has increased from prior report; unable to open 2018 study. FINDINGS  Left Ventricle: Left ventricular ejection fraction, by estimation, is 65 to 70%. The left ventricle has normal function. The left ventricle has no regional wall motion abnormalities. The left ventricular internal cavity size was normal in size. There is  no left ventricular hypertrophy. Left ventricular diastolic parameters are indeterminate. Right Ventricle: The right ventricular size is mildly enlarged. No increase in right ventricular wall thickness. Right ventricular systolic function is normal. There is mildly elevated pulmonary artery systolic pressure. The tricuspid regurgitant velocity is 2.99 m/s, and with an assumed right atrial pressure of 3 mmHg, the estimated right ventricular systolic pressure is 24.0 mmHg.  Left Atrium: Left atrial size was mildly dilated. Right Atrium: Right atrial size was normal in size. Pericardium: There is no evidence of pericardial effusion. Mitral Valve: The mitral valve is grossly normal. No evidence of mitral valve regurgitation. Tricuspid Valve: The tricuspid valve is normal in structure. Tricuspid valve regurgitation is mild . No evidence of tricuspid stenosis. Aortic Valve: The aortic valve is tricuspid. There is mild thickening of the aortic valve. Aortic valve regurgitation is moderate. Aortic regurgitation PHT measures 460 msec. No aortic stenosis is present. Aortic valve mean gradient measures 5.5 mmHg. Aortic valve peak gradient measures 12.0 mmHg. Aortic valve area, by VTI measures 1.66 cm. Pulmonic Valve: The pulmonic valve was normal in structure. Pulmonic valve regurgitation is trivial. No evidence of pulmonic stenosis. Aorta: Aortic dilatation noted. There is borderline dilatation of the ascending aorta, measuring 39 mm. IAS/Shunts: No atrial level shunt detected by color flow Doppler.  LEFT VENTRICLE PLAX 2D LVIDd:         4.00 cm LVIDs:         2.70 cm LV PW:         1.10 cm LV IVS:        1.00 cm LVOT diam:     1.70 cm LV SV:         55 LV SV Index:   30 LVOT Area:     2.27 cm  LV Volumes (MOD) LV vol d, MOD A2C: 60.2 ml LV vol d, MOD A4C: 59.4 ml LV vol s, MOD A2C: 10.6 ml LV vol s, MOD A4C: 23.5 ml LV SV MOD A2C:     49.6 ml LV SV MOD A4C:     59.4 ml LV SV MOD BP:      42.8 ml RIGHT VENTRICLE RV Basal diam:  4.10 cm RV Mid diam:    2.70 cm RV S prime:     14.70 cm/s TAPSE (M-mode): 2.8 cm LEFT ATRIUM  Index        RIGHT ATRIUM           Index LA diam:        2.80 cm 1.51 cm/m   RA Area:     16.10 cm LA Vol (A2C):   69.0 ml 37.18 ml/m  RA Volume:   36.40 ml  19.61 ml/m LA Vol (A4C):   74.2 ml 39.98 ml/m LA Biplane Vol: 77.8 ml 41.92 ml/m  AORTIC VALVE                     PULMONIC VALVE AV Area (Vmax):    1.57 cm      PV Vmax:          1.16 m/s AV Area  (Vmean):   1.68 cm      PV Peak grad:     5.4 mmHg AV Area (VTI):     1.66 cm      PR End Diast Vel: 5.86 msec AV Vmax:           173.00 cm/s AV Vmean:          106.500 cm/s AV VTI:            0.333 m AV Peak Grad:      12.0 mmHg AV Mean Grad:      5.5 mmHg LVOT Vmax:         120.00 cm/s LVOT Vmean:        79.000 cm/s LVOT VTI:          0.244 m LVOT/AV VTI ratio: 0.73 AI PHT:            460 msec  AORTA Ao Root diam: 3.10 cm Ao Asc diam:  3.80 cm MR Peak grad: 59.9 mmHg     TRICUSPID VALVE MR Vmax:      387.00 cm/s   TR Peak grad:   35.8 mmHg MV E velocity: 102.00 cm/s  TR Vmax:        299.00 cm/s                              SHUNTS                             Systemic VTI:  0.24 m                             Systemic Diam: 1.70 cm Rudean Haskell MD Electronically signed by Rudean Haskell MD Signature Date/Time: 12/09/2021/1:26:47 PM    Final    VAS US CAROTID  Result Date: 12/09/2021 Carotid Arterial Duplex Study Patient Name:  PAISLEIGH MARONEY  Date of Exam:   12/09/2021 Medical Rec #: [id number]            Accession #:    [id number] Date of Birth: 22-Dec-1940            Patient Gender: F Patient Age:   72 years Exam Location:  [facility] Procedure:      VAS US CAROTID Referring Phys: Babs Bertin --------------------------------------------------------------------------------  Indications:       TIA. Risk Factors:      Hypertension, hyperlipidemia, no history of smoking, prior                    CVA. Comparison Study:  11-29-2016 CTA  Neck showed no evidence of stenosis of right                    carotid and 25% or less of left carotid. Performing Technologist: Darlin Coco RDMS, RVT  Examination Guidelines: A complete evaluation includes B-mode imaging, spectral Doppler, color Doppler, and power Doppler as needed of all accessible portions of each vessel. Bilateral testing is considered an integral part of a complete examination. Limited examinations for reoccurring indications may be  performed as noted.  Right Carotid Findings: +----------+-------+-------+--------+------------------------+-----------------+           PSV    EDV    StenosisPlaque Description      Comments                    cm/s   cm/s                                                     +----------+-------+-------+--------+------------------------+-----------------+ CCA Prox  79     9                                                        +----------+-------+-------+--------+------------------------+-----------------+ CCA Distal59     7                                      intimal                                                                   thickening        +----------+-------+-------+--------+------------------------+-----------------+ ICA Prox  62     16     1-39%   focal, smooth and                                                         calcific                                  +----------+-------+-------+--------+------------------------+-----------------+ ICA Distal88     18                                                       +----------+-------+-------+--------+------------------------+-----------------+ ECA       109                                                             +----------+-------+-------+--------+------------------------+-----------------+ +----------+--------+-------+----------------+-------------------+  PSV cm/sEDV cmsDescribe        Arm Pressure (mmHG) +----------+--------+-------+----------------+-------------------+ [id number]            Multiphasic, WNL                    +----------+--------+-------+----------------+-------------------+ +---------+--------+--+--------+-+---------+ VertebralPSV cm/s62EDV cm/s7Antegrade +---------+--------+--+--------+-+---------+  Left Carotid Findings: +----------+--------+--------+--------+-------------------+--------+           PSV cm/sEDV cm/sStenosisPlaque  Description Comments +----------+--------+--------+--------+-------------------+--------+ CCA Prox  71      13                                          +----------+--------+--------+--------+-------------------+--------+ CCA Distal64      10                                          +----------+--------+--------+--------+-------------------+--------+ ICA Prox  57      12      1-39%   smooth and calcific         +----------+--------+--------+--------+-------------------+--------+ ICA Distal64      17                                          +----------+--------+--------+--------+-------------------+--------+ ECA       99                                                  +----------+--------+--------+--------+-------------------+--------+ +----------+--------+--------+----------------+-------------------+           PSV cm/sEDV cm/sDescribe        Arm Pressure (mmHG) +----------+--------+--------+----------------+-------------------+ Subclavian171             Multiphasic, WNL                    +----------+--------+--------+----------------+-------------------+ +---------+--------+--+--------+-+---------+ VertebralPSV cm/s67EDV cm/s8Antegrade +---------+--------+--+--------+-+---------+   Summary: Right Carotid: Velocities in the right ICA are consistent with a 1-39% stenosis. Left Carotid: Velocities in the left ICA are consistent with a 1-39% stenosis. Vertebrals:  Bilateral vertebral arteries demonstrate antegrade flow. Subclavians: Normal flow hemodynamics were seen in bilateral subclavian              arteries. *See table(s) above for measurements and observations.  Electronically signed by Servando Snare MD on 12/09/2021 at 5:29:32 PM.    Final      LOS: 2 days   Antonieta Pert, MD [facility]  12/11/2021, 11:00 AM

## 2021-12-11 NOTE — Plan of Care (Signed)

## 2021-12-11 NOTE — Progress Notes (Signed)
OT Cancellation Note  Patient Details Name: Valerie Mcclure MRN: [id number] DOB: 1940/12/08   Cancelled Treatment:    Reason Eval/Treat Not Completed: Other (comment)- pt asleep with daughter at side, requesting OT to return later.  OT will follow and see as able.   Jolaine Artist, OT Acute Rehabilitation Services Pager [phone] Office [phone]     Delight Stare 12/11/2021, 10:13 AM

## 2021-12-11 NOTE — Progress Notes (Signed)
Patient has sitter at bedside. Patient refused AM meds.

## 2021-12-11 NOTE — Progress Notes (Signed)
Bilateral lower extremity venous duplex has been completed. Preliminary results can be found in CV Proc through chart review.   12/11/21 1:06 PM Valerie Mcclure RVT

## 2021-12-11 NOTE — Progress Notes (Signed)
[facility] called to inform RN that at 1455 patient had experienced a 8 beat run of V-Tach. The tech stated that patient has history of experiencing non-sustained V-Tach runs once a day for the past couple of days. MD notified.

## 2021-12-12 ENCOUNTER — Inpatient Hospital Stay (HOSPITAL_COMMUNITY): Payer: HMO

## 2021-12-12 DIAGNOSIS — I739 Peripheral vascular disease, unspecified: Secondary | ICD-10-CM

## 2021-12-12 DIAGNOSIS — Z515 Encounter for palliative care: Secondary | ICD-10-CM | POA: Diagnosis not present

## 2021-12-12 DIAGNOSIS — Z7189 Other specified counseling: Secondary | ICD-10-CM | POA: Diagnosis not present

## 2021-12-12 DIAGNOSIS — Z66 Do not resuscitate: Secondary | ICD-10-CM

## 2021-12-12 DIAGNOSIS — R55 Syncope and collapse: Secondary | ICD-10-CM | POA: Diagnosis not present

## 2021-12-12 DIAGNOSIS — G9341 Metabolic encephalopathy: Secondary | ICD-10-CM | POA: Diagnosis not present

## 2021-12-12 LAB — CBC
HCT: 28.8 % — ABNORMAL LOW (ref 36.0–46.0)
Hemoglobin: 9.3 g/dL — ABNORMAL LOW (ref 12.0–15.0)
MCH: 31.3 pg (ref 26.0–34.0)
MCHC: 32.3 g/dL (ref 30.0–36.0)
MCV: 97 fL (ref 80.0–100.0)
Platelets: 140 10*3/uL — ABNORMAL LOW (ref 150–400)
RBC: 2.97 MIL/uL — ABNORMAL LOW (ref 3.87–5.11)
RDW: 16.8 % — ABNORMAL HIGH (ref 11.5–15.5)
WBC: 6.7 10*3/uL (ref 4.0–10.5)
nRBC: 0 % (ref 0.0–0.2)

## 2021-12-12 LAB — COMPREHENSIVE METABOLIC PANEL
ALT: 21 U/L (ref 0–44)
AST: 23 U/L (ref 15–41)
Albumin: 2.4 g/dL — ABNORMAL LOW (ref 3.5–5.0)
Alkaline Phosphatase: 59 U/L (ref 38–126)
Anion gap: 7 (ref 5–15)
BUN: 15 mg/dL (ref 8–23)
CO2: 17 mmol/L — ABNORMAL LOW (ref 22–32)
Calcium: 8.4 mg/dL — ABNORMAL LOW (ref 8.9–10.3)
Chloride: 116 mmol/L — ABNORMAL HIGH (ref 98–111)
Creatinine, Ser: 1.38 mg/dL — ABNORMAL HIGH (ref 0.44–1.00)
GFR, Estimated: 38 mL/min — ABNORMAL LOW (ref >60)
Glucose, Bld: 95 mg/dL (ref 70–99)
Potassium: 4 mmol/L (ref 3.5–5.1)
Sodium: 140 mmol/L (ref 135–145)
Total Bilirubin: 0.5 mg/dL (ref 0.3–1.2)
Total Protein: 5 g/dL — ABNORMAL LOW (ref 6.5–8.1)

## 2021-12-12 LAB — MAGNESIUM: Magnesium: 1.7 mg/dL (ref 1.7–2.4)

## 2021-12-12 MED ORDER — APIXABAN 2.5 MG PO TABS: 2.5000 mg | ORAL_TABLET | Freq: Two times a day (BID) | ORAL | Status: DC

## 2021-12-12 MED ORDER — APIXABAN 5 MG PO TABS
5.0000 mg | ORAL_TABLET | Freq: Two times a day (BID) | ORAL | Status: DC
Start: 2021-12-12 — End: 2021-12-16
  Administered 2021-12-12 – 2021-12-16 (×9): 5 mg via ORAL
  Filled 2021-12-12 (×9): qty 1

## 2021-12-12 MED ORDER — METOPROLOL TARTRATE 5 MG/5ML IV SOLN
5.0000 mg | Freq: Once | INTRAVENOUS | Status: AC
  Administered 2021-12-12: 5 mg via INTRAVENOUS
  Filled 2021-12-12: qty 5

## 2021-12-12 MED ORDER — APIXABAN 5 MG PO TABS: 5.0000 mg | ORAL_TABLET | Freq: Two times a day (BID) | ORAL | Status: DC

## 2021-12-12 NOTE — Progress Notes (Signed)
PROGRESS NOTE Valerie Mcclure  DJS:[id number] DOB: [birth year] DOA: 12/08/2021 PCP: [name], MD   Brief Narrative/Hospital Course: 81 year old female with history of persistent A-fib on Eliquis, chronic HFpEF, HTN, HLD, hypothyroidism, CKD 4 presented with an episode of unwitnessed unresponsive episode at home.  Apparently patient was found unresponsive in her bathroom with a heart rate of 30 and her right arm was contractured.  Patient was seen in the ED-EKG with A-fib rate controlled, labs showed AKI hypoalbuminemia, anemia.  Chest x-ray no active disease, CT head code stroke no acute finding, redemonstration of chronic left MCA territory infarct within the left temporal lobe insula and basal ganglia chronic small vessel changes, neurology was consulted, patient was admitted for further management.  She is being treated for syncope and collapse due to orthostatic hypotension, acute metabolic encephalopathy,no acute stroke on MRI.  Subjective: Seen and examined this morning, daughter at the bedside patient had significant improvement she is alert awake oriented to self current place-not to date. Overnight patient is afebrile, on room air Labs shows improving creatinine  Assessment and Plan: Principal Problem:   Syncope and collapse Active Problems:   Orthostatic hypotension   Stroke-like symptom   SIRS (systemic inflammatory response syndrome) ([name])   Bilateral edema of lower extremity   Acute renal failure superimposed on stage 4 chronic kidney disease ([name])   Chronic diastolic heart failure ([name])   Bradycardia   Persistent atrial fibrillation ([name])   Depression   Hypothyroidism   Hyperlipidemia   Metabolic acidosis   Normocytic anemia   Thrombocytopenia ([name])   Memory deficit   Acute metabolic encephalopathy   Assessment and Plan:  Syncope and collapse Orthostatic hypotension: Presents with an unwitnessed fall per daughter she was talking with her when that happened  in the bathroom but she did not witness this likely loss of consciousness.  Completed code stroke work-up and negative.  Continue IV fluids, PT OT and check orthostatic vitals today-as mentation has improved.   Stroke-like symptom: Code stroke work up done with CT head in ED. MRI brain/MR angio head-no evidence of acute or subacute stroke, mildly limited by motion.Echo EF 65 to 70%, indeterminate diastolic parameters, normal RV function, mitral aortic valve grossly normal moderate AR.Carotid duplex unremarkable.  Acute metabolic encephalopathy suspect delirium Baseline memory deficit/cognitive impairment: Patient with delirium, so far infectious etiology unremarkable repeat UA-negative leukocytes and nitrite. MRI brain no acute finding.  Mostly hypotensive orthostatic and component of dehydration and AKI.  Mental status significantly improved. Continue IV fluid hydration supportive care delirium precaution minimize sedatives.  Continue PT OT and ambulation.   SIRS: Sepsis ruled out. blood culture ordered sent so far negative, UA  cxr 5/22- no e/o infection.  Remains afebrile leukocytosis initially mild and now resolved, patient on ceftriaxone..  Family endorses similar presentation when patient has UTI repeat UA is unremarkable.  Discontinue antibiotics.  Chronic lower extremity swelling per daughter, duplex of lower limited negative for DVT. ABI being done this morning patient refused past day due to confusion.Received dose of albumin.  Anemia likely from chronic disease, holding at 9-10 g although some drop~ 7.7-likely artifact.  Recent Labs  Lab 12/09/21 0409 12/10/21 0252 12/11/21 0215 12/11/21 1951 12/12/21 0452  HGB 10.1* 9.2* 7.7* 8.9* 9.3*  HCT 32.2* 28.8* 24.2* 27.9* 28.8*    AKI on CKD stage IV baseline creatinine 1.8- YIFO-27.7 Metabolic acidosis: Likely due to dehydration hypotension related, creatinine nicely improving on IV fluids BUN normalize,. hco3 improving Recent Labs  Lab 12/08/21 1629 12/09/21 0409 12/10/21 0252 12/11/21 0215 12/12/21 0452  BUN 68* 55* 40* 26* 15  CREATININE 2.70* 2.16* 1.93* 1.58* 1.38*    Persistent atrial fibrillation : Reportedly heart rate in 30s when the event happened, initially antihypertensive and a blocker medication held.  Patient had been tachycardic and metoprolol resumed.  We will switch back to Eliquis.  Chronic diastolic heart failure: Essential hypertension: Some hypovolemia on admission needing gentle IV fluid hydration.  Normally on torsemide -and remains on hold. lisinopril 2.5 hydralazine 25 3 times daily currently on hold.    Depression cont home meds HLD cont statin Hypothyroidism euthyroid TSH 6.8 Free T41.09 Continue Synthroid  Goals of care: Full code, requesting palliative care consultation by family.  Discussed with daughter and agreeable at bedside.  Palliative care eval appreciated, patient is clinically improving  DVT prophylaxis: apixaban (ELIQUIS) tablet 2.5 mg Start: 12/12/21 1130 SCDs Start: 12/08/21 1939 Code Status:   Code Status: Full Code Family Communication: plan of care discussed with patient/daughter at bedside updated and happy with her progress. Patient status is: Inpatient because of ongoing altered mental status/delirium Level of care: Telemetry Medical   Dispo: The patient is from: Home            Anticipated disposition: Disposition next 24 hours family would like to take her home, await further PT inputs.  Mobility Assessment (last 72 hours)     Mobility Assessment     Row Name 12/11/21 0845 12/11/21 0407 12/11/21 0010 12/10/21 1940 12/10/21 1901   Does patient have an order for bedrest or is patient medically unstable No - Continue assessment No - Continue assessment No - Continue assessment No - Continue assessment No - Continue assessment   What is the highest level of mobility based on the progressive mobility assessment? Level 2 (Chairfast) - Balance while sitting on  edge of bed and cannot stand Level 3 (Stands with assist) - Balance while standing  and cannot march in place Level 3 (Stands with assist) - Balance while standing  and cannot march in place Level 3 (Stands with assist) - Balance while standing  and cannot march in place Level 3 (Stands with assist) - Balance while standing  and cannot march in place   Is the above level different from baseline mobility prior to current illness? Yes - Recommend PT order Yes - Recommend PT order Yes - Recommend PT order Yes - Recommend PT order Yes - Recommend PT order    [place] Name 12/10/21 1900 12/10/21 1625 12/10/21 0830 12/10/21 0021 12/09/21 1947   Does patient have an order for bedrest or is patient medically unstable No - Continue assessment -- No - Continue assessment No - Continue assessment No - Continue assessment   What is the highest level of mobility based on the progressive mobility assessment? Level 3 (Stands with assist) - Balance while standing  and cannot march in place Level 2 (Chairfast) - Balance while sitting on edge of bed and cannot stand Level 2 (Chairfast) - Balance while sitting on edge of bed and cannot stand Level 3 (Stands with assist) - Balance while standing  and cannot march in place Level 3 (Stands with assist) - Balance while standing  and cannot march in place   Is the above level different from baseline mobility prior to current illness? Yes - Recommend PT order -- Yes - Recommend PT order Yes - Recommend PT order Yes - Recommend PT order    [name] Name 12/09/21  1100           What is the highest level of mobility based on the progressive mobility assessment? Level 3 (Stands with assist) - Balance while standing  and cannot march in place                 Objective: Vitals last 24 hrs: Vitals:   12/11/21 1926 12/11/21 2318 12/12/21 0328 12/12/21 0818  BP: (!) 114/32 136/62 135/60 (!) 145/59  Pulse: 88 77 96 92  Resp: $Remo'17 20 19 18  'PAxeN$ Temp: 97.6 F (36.4 C) 98.2 F (36.8 C)  97.8 F (36.6 C) 98.2 F (36.8 C)  TempSrc: Oral Oral Oral Oral  SpO2: 100% 100% 97% 99%  Weight:      Height:       Weight change:   Physical Examination: General exam: AAOX2-3,older than stated age, weak appearing. HEENT:Oral mucosa moist, Ear/Nose WNL grossly, dentition normal. Respiratory system: bilaterally diminished,no use of accessory muscle Cardiovascular system: S1 & S2 +, No JVD,. Gastrointestinal system: Abdomen soft,NT,ND, BS+ Nervous System:Alert, awake, moving extremities and grossly nonfocal Extremities: edema lower extremity chronic appearing,distal peripheral pulses palpable.  Skin: No rashes,no icterus. MSK: Normal muscle bulk,tone, power   Medications reviewed:  Scheduled Meds:  apixaban  2.5 mg Oral BID   atorvastatin  20 mg Oral Daily   FLUoxetine  40 mg Oral Daily   levothyroxine  75 mcg Oral Q0600   mupirocin ointment  1 application. Topical BID   nortriptyline  40 mg Oral QHS   Continuous Infusions:  lactated ringers 75 mL/hr at 12/11/21 1033     Diet Order             DIET DYS 3 Room service appropriate? Yes; Fluid consistency: Thin  Diet effective now                            Intake/Output Summary (Last 24 hours) at 12/12/2021 1047 Last data filed at 12/12/2021 0330 Gross per 24 hour  Intake 0 ml  Output 450 ml  Net -450 ml   Net IO Since Admission: 4,973.25 mL [12/12/21 1047]  Wt Readings from Last 3 Encounters:  12/09/21 76.1 kg  10/13/21 77.6 kg  06/18/21 73.9 kg     Unresulted Labs (From admission, onward)     Start     Ordered   12/13/21 4656  Basic metabolic panel  Tomorrow morning,   R       Question:  Specimen collection method  Answer:  Lab=Lab collect   12/12/21 1046          Data Reviewed: I have personally reviewed following labs and imaging studies CBC: Recent Labs  Lab 12/08/21 1628 12/08/21 1629 12/09/21 0409 12/10/21 0252 12/11/21 0215 12/11/21 1951 12/12/21 0452  WBC 10.6*  --  11.3*  9.1 7.4  --  6.7  NEUTROABS 7.9*  --  8.7*  --   --   --   --   HGB 11.4*   < > 10.1* 9.2* 7.7* 8.9* 9.3*  HCT 35.8*   < > 32.2* 28.8* 24.2* 27.9* 28.8*  MCV 99.4  --  98.5 97.3 98.8  --  97.0  PLT 204  --  166 156 146*  --  140*   < > = values in this interval not displayed.   Basic Metabolic Panel: Recent Labs  Lab 12/08/21 1628 12/08/21 1629 12/09/21 0409 12/10/21 0252 12/11/21 0215 12/12/21 0452  NA  137 137 139 139 140 140  K 4.5 4.6 4.2 4.5 3.9 4.0  CL 109 110 114* 116* 118* 116*  CO2 15*  --  15* 16* 16* 17*  GLUCOSE 120* 117* 92 96 141* 95  BUN 67* 68* 55* 40* 26* 15  CREATININE 2.61* 2.70* 2.16* 1.93* 1.58* 1.38*  CALCIUM 9.0  --  8.3* 8.3* 8.2* 8.4*  MG  --   --  1.7 1.7 1.7 1.7   GFR: Estimated Creatinine Clearance: 32.7 mL/min (A) (by C-G formula based on SCr of 1.38 mg/dL (H)). Liver Function Tests: Recent Labs  Lab 12/08/21 1628 12/09/21 0409 12/10/21 0252 12/11/21 0215 12/12/21 0452  AST $Re'25 24 26 24 23  'Vsw$ ALT $R'26 24 23 21 21  'Vq$ ALKPHOS 79 67 67 56 59  BILITOT 0.4 0.5 0.8 0.2* 0.5  PROT 5.8* 5.0* 4.9* 4.7* 5.0*  ALBUMIN 2.6* 2.2* 2.1* 2.4* 2.4*   No results for input(s): LIPASE, AMYLASE in the last 168 hours. No results for input(s): AMMONIA in the last 168 hours. Coagulation Profile: Recent Labs  Lab 12/08/21 1628  INR 1.4*   BNP (last 3 results) No results for input(s): PROBNP in the last 8760 hours. HbA1C: No results for input(s): HGBA1C in the last 72 hours. CBG: Recent Labs  Lab 12/08/21 1622  GLUCAP 119*   Lipid Profile: No results for input(s): CHOL, HDL, LDLCALC, TRIG, CHOLHDL, LDLDIRECT in the last 72 hours.  Thyroid Function Tests: No results for input(s): TSH, T4TOTAL, FREET4, T3FREE, THYROIDAB in the last 72 hours.  Sepsis Labs: Recent Labs  Lab 12/08/21 1628 12/08/21 2042 12/09/21 0409  PROCALCITON <0.10  --   --   LATICACIDVEN  --  1.7 1.2    Recent Results (from the past 240 hour(s))  Culture, blood (single)      Status: None (Preliminary result)   Collection Time: 12/08/21  6:05 PM   Specimen: BLOOD  Result Value Ref Range Status   Specimen Description BLOOD RIGHT ANTECUBITAL  Final   Special Requests   Final    BOTTLES DRAWN AEROBIC AND ANAEROBIC Blood Culture results may not be optimal due to an inadequate volume of blood received in culture bottles   Culture   Final    NO GROWTH 4 DAYS Performed at [facility], [place] [address], [place], [place] [address]    Report Status PENDING  Incomplete    Antimicrobials: Anti-infectives (From admission, onward)    Start     Dose/Rate Route Frequency Ordered Stop   12/10/21 1800  vancomycin (VANCOCIN) IVPB 1000 mg/200 mL premix  Status:  Discontinued        1,000 mg 200 mL/hr over 60 Minutes Intravenous Every 48 hours 12/08/21 1811 12/09/21 0823   12/09/21 1600  cefTRIAXone (ROCEPHIN) 1 g in sodium chloride 0.9 % 100 mL IVPB  Status:  Discontinued        1 g 200 mL/hr over 30 Minutes Intravenous Every 24 hours 12/08/21 2028 12/12/21 1046   12/08/21 1800  vancomycin (VANCOREADY) IVPB 1500 mg/300 mL        1,500 mg 150 mL/hr over 120 Minutes Intravenous  Once 12/08/21 1745 12/08/21 2154   12/08/21 1745  vancomycin (VANCOCIN) IVPB 1000 mg/200 mL premix  Status:  Discontinued        1,000 mg 200 mL/hr over 60 Minutes Intravenous  Once 12/08/21 1737 12/08/21 1745   12/08/21 1745  cefTRIAXone (ROCEPHIN) 2 g in sodium chloride 0.9 % 100 mL IVPB  2 g 200 mL/hr over 30 Minutes Intravenous  Once 12/08/21 1737 12/08/21 1844      Culture/Microbiology    Component Value Date/Time   SDES BLOOD RIGHT ANTECUBITAL 12/08/2021 1805   SPECREQUEST  12/08/2021 1805    BOTTLES DRAWN AEROBIC AND ANAEROBIC Blood Culture results may not be optimal due to an inadequate volume of blood received in culture bottles   CULT  12/08/2021 1805    NO GROWTH 4 DAYS Performed at [facility], [place] [address] [place], [place] [address]    REPTSTATUS PENDING  12/08/2021 1805    Other culture-see note  Radiology Studies: VAS [place] LOWER EXTREMITY VENOUS (DVT)  Result Date: 12/11/2021  Lower Venous DVT Study Patient Name:  CYNDAL KASSON  Date of Exam:   12/11/2021 Medical Rec #: [id number]            Accession #:    [id number] Date of Birth: 1940-08-14            Patient Gender: F Patient Age:   1 years Exam Location:  [facility] Procedure:      VAS [place] LOWER EXTREMITY VENOUS (DVT) Referring Phys: PRANAV PATEL --------------------------------------------------------------------------------  Indications: Edema.  Risk Factors: None identified. Limitations: Body habitus, poor ultrasound/tissue interface and patient positioning, patient immobility, patient pain tolerance, patient somnolence. Comparison Study: No prior studies. Performing Technologist: Oliver Hum RVT  Examination Guidelines: A complete evaluation includes B-mode imaging, spectral Doppler, color Doppler, and power Doppler as needed of all accessible portions of each vessel. Bilateral testing is considered an integral part of a complete examination. Limited examinations for reoccurring indications may be performed as noted. The reflux portion of the exam is performed with the patient in reverse Trendelenburg.  +---------+---------------+---------+-----------+----------+-------------------+ RIGHT    CompressibilityPhasicitySpontaneityPropertiesThrombus Aging      +---------+---------------+---------+-----------+----------+-------------------+ CFV      Full           Yes      Yes                                      +---------+---------------+---------+-----------+----------+-------------------+ SFJ      Full                                                             +---------+---------------+---------+-----------+----------+-------------------+ FV Prox  Full                                                              +---------+---------------+---------+-----------+----------+-------------------+ FV Mid   Full                                                             +---------+---------------+---------+-----------+----------+-------------------+ FV Distal               Yes      Yes                                      +---------+---------------+---------+-----------+----------+-------------------+  PFV      Full                                                             +---------+---------------+---------+-----------+----------+-------------------+ POP      Full           Yes      Yes                                      +---------+---------------+---------+-----------+----------+-------------------+ PTV      Full                                                             +---------+---------------+---------+-----------+----------+-------------------+ PERO                                                  Patency shown with                                                        color doppler       +---------+---------------+---------+-----------+----------+-------------------+   +---------+---------------+---------+-----------+----------+--------------+ LEFT     CompressibilityPhasicitySpontaneityPropertiesThrombus Aging +---------+---------------+---------+-----------+----------+--------------+ CFV      Full           Yes      Yes                                 +---------+---------------+---------+-----------+----------+--------------+ SFJ      Full                                                        +---------+---------------+---------+-----------+----------+--------------+ FV Prox  Full                                                        +---------+---------------+---------+-----------+----------+--------------+ FV Mid   Full                                                         +---------+---------------+---------+-----------+----------+--------------+ FV Distal               Yes      Yes                                 +---------+---------------+---------+-----------+----------+--------------+  PFV      Full                                                        +---------+---------------+---------+-----------+----------+--------------+ POP      Full           Yes      Yes                                 +---------+---------------+---------+-----------+----------+--------------+ PTV      Full                                                        +---------+---------------+---------+-----------+----------+--------------+ PERO     Full                                                        +---------+---------------+---------+-----------+----------+--------------+     Summary: RIGHT: - There is no evidence of deep vein thrombosis in the lower extremity. However, portions of this examination were limited- see technologist comments above.  - No cystic structure found in the popliteal fossa.  LEFT: - There is no evidence of deep vein thrombosis in the lower extremity. However, portions of this examination were limited- see technologist comments above.  - No cystic structure found in the popliteal fossa.  *See table(s) above for measurements and observations. Electronically signed by Jamelle Haring on 12/11/2021 at 4:52:09 PM.    Final      LOS: 3 days   Antonieta Pert, MD [facility]  12/12/2021, 10:47 AM

## 2021-12-12 NOTE — Progress Notes (Signed)
Physical Therapy Treatment Patient Details Name: Valerie Mcclure MRN: [id number] DOB: 07/30/1940 Today's Date: 12/12/2021   History of Present Illness pt is an 81 y/o female admitted 5/22 with cone with syncope  via EMS from home.  MRI negative for acute abnormality.  Found with hypotension, sepsis. PMHx: persistent Afib, chronic dHF, HTN, HLD, chronic sacral wound, CKD stage IV    PT Comments    Pt now making better strides toward goals and mentation.  Emphasis on transitions, safe sit to/from stand, progression of gait stability/quality and stamina with RW.  Pt needs more strengthening of her hips/pelvic musculature.    Recommendations for follow up therapy are one component of a multi-disciplinary discharge planning process, led by the attending physician.  Recommendations may be updated based on patient status, additional functional criteria and insurance authorization.  Follow Up Recommendations  Home health PT     Assistance Recommended at Discharge Intermittent Supervision/Assistance  Patient can return home with the following A little help with walking and/or transfers;A little help with bathing/dressing/bathroom;Assistance with cooking/housework;Assist for transportation;Help with stairs or ramp for entrance   Equipment Recommendations  Rolling walker (2 wheels);BSC/3in1    Recommendations for Other Services       Precautions / Restrictions Precautions Precautions: Fall     Mobility  Bed Mobility Overal bed mobility: Needs Assistance Bed Mobility: Supine to Sit     Supine to sit: Min assist     General bed mobility comments: cues for sequencing and min truncal assist    Transfers Overall transfer level: Needs assistance Equipment used: Rolling walker (2 wheels) Transfers: Sit to/from Stand Sit to Stand: Min assist           General transfer comment: cues for hand placement, more stability assist though some assist forward.  pt not biased posteriorly  like last session.    Ambulation/Gait Ambulation/Gait assistance: Min assist Gait Distance (Feet): 180 Feet Assistive device: Rolling walker (2 wheels) Gait Pattern/deviations: Step-through pattern   Gait velocity interpretation: <1.8 ft/sec, indicate of risk for recurrent falls Pre-gait activities: slower General Gait Details: mildly unsteady with contralateral hip drop on the L making it more difficult to swing through, but much improved step width, good posture and proximity to the [employer] Mobility    Modified Rankin (Stroke Patients Only)       Balance Overall balance assessment: Needs assistance Sitting-balance support: No upper extremity supported, Single extremity supported Sitting balance-Leahy Scale: Fair     Standing balance support: Bilateral upper extremity supported, Single extremity supported, During functional activity Standing balance-Leahy Scale: Poor Standing balance comment: reliant on External support or AD                            Cognition Arousal/Alertness: Awake/alert Behavior During Therapy: WFL for tasks assessed/performed Overall Cognitive Status: History of cognitive impairments - at baseline (dtr reports pt is fairly close to baseline mentation.)                                          Exercises      General Comments General comments (skin integrity, edema, etc.): pt's daughter in room to witness pt's mobility.  She states it is approaching baseline, though using a RW and states she is  comfortable with this level of mobility.      Pertinent Vitals/Pain Pain Assessment Pain Assessment: Faces Faces Pain Scale: No hurt Pain Intervention(s): Monitored during session    Home Living Family/patient expects to be discharged to:: Private residence                        Prior Function            PT Goals (current goals can now be found in the care plan section)  Acute Rehab PT Goals PT Goal Formulation: With patient Time For Goal Achievement: 12/23/21 Potential to Achieve Goals: Good Progress towards PT goals: Progressing toward goals    Frequency    Min 3X/week      PT Plan Current plan remains appropriate    Co-evaluation              AM-PAC PT "6 Clicks" Mobility   Outcome Measure  Help needed turning from your back to your side while in a flat bed without using bedrails?: A Little Help needed moving from lying on your back to sitting on the side of a flat bed without using bedrails?: A Little Help needed moving to and from a bed to a chair (including a wheelchair)?: A Little Help needed standing up from a chair using your arms (e.g., wheelchair or bedside chair)?: A Little Help needed to walk in hospital room?: A Little Help needed climbing 3-5 steps with a railing? : A Lot 6 Click Score: 17    End of Session   Activity Tolerance: Patient tolerated treatment well Patient left: in chair;with call bell/phone within reach;with chair alarm set;with family/visitor present Nurse Communication: Mobility status PT Visit Diagnosis: Unsteadiness on feet (R26.81);Other abnormalities of gait and mobility (R26.89);Other symptoms and signs involving the nervous system (R29.898)     Time: 3149-7026 PT Time Calculation (min) (ACUTE ONLY): 26 min  Charges:  $Gait Training: 8-22 mins $Therapeutic Activity: 8-22 mins                     12/12/2021  Ginger Carne., PT Acute Rehabilitation Services [phone]  (pager) [phone]  (office)   Tessie Fass Lyrical Sowle 12/12/2021, 4:41 PM

## 2021-12-12 NOTE — Care Management Important Message (Signed)
Important Message  Patient Details  Name: CYNDEE GIAMMARCO MRN: [id number] Date of Birth: 1941-03-30   Medicare Important Message Given:  Yes     Orbie Pyo 12/12/2021, 3:17 PM

## 2021-12-12 NOTE — Progress Notes (Signed)
ABI's have been completed. Preliminary results can be found in CV Proc through chart review.   12/12/21 10:30 AM Valerie Mcclure RVT

## 2021-12-12 NOTE — Plan of Care (Signed)
Pt is alert oriented x 3. Pt is RA. Denies pain. Pt turned and repositioned in bed. Dressing changed to right buttock. Pt has sitter for safety.    Problem: Education: Goal: Knowledge of General Education information will improve Description: Including pain rating scale, medication(s)/side effects and non-pharmacologic comfort measures Outcome: Progressing   Problem: Health Behavior/Discharge Planning: Goal: Ability to manage health-related needs will improve Outcome: Progressing   Problem: Clinical Measurements: Goal: Ability to maintain clinical measurements within normal limits will improve Outcome: Progressing Goal: Will remain free from infection Outcome: Progressing Goal: Diagnostic test results will improve Outcome: Progressing Goal: Respiratory complications will improve Outcome: Progressing Goal: Cardiovascular complication will be avoided Outcome: Progressing   Problem: Activity: Goal: Risk for activity intolerance will decrease Outcome: Progressing   Problem: Nutrition: Goal: Adequate nutrition will be maintained Outcome: Progressing   Problem: Coping: Goal: Level of anxiety will decrease Outcome: Progressing   Problem: Elimination: Goal: Will not experience complications related to bowel motility Outcome: Progressing Goal: Will not experience complications related to urinary retention Outcome: Progressing   Problem: Pain Managment: Goal: General experience of comfort will improve Outcome: Progressing   Problem: Safety: Goal: Ability to remain free from injury will improve Outcome: Progressing   Problem: Skin Integrity: Goal: Risk for impaired skin integrity will decrease Outcome: Progressing   Problem: Education: Goal: Knowledge of disease or condition will improve Outcome: Progressing Goal: Knowledge of secondary prevention will improve (SELECT ALL) Outcome: Progressing Goal: Knowledge of patient specific risk factors will improve (INDIVIDUALIZE  FOR PATIENT) Outcome: Progressing

## 2021-12-12 NOTE — Progress Notes (Signed)
Daily Progress Note   Patient Name: Valerie Mcclure       Date: 12/12/2021 DOB: 1941/02/08  Age: 81 y.o. MRN#: [id number] Attending Physician: Antonieta Pert, MD Primary Care Physician: [name], MD Admit Date: 12/08/2021  Reason for Consultation/Follow-up: Establishing goals of care  Subjective: Tells me she feels much better, ate a good breakfast, looking forward to going home  Length of Stay: 3  Current Medications: Scheduled Meds:   apixaban  5 mg Oral BID   atorvastatin  20 mg Oral Daily   FLUoxetine  40 mg Oral Daily   levothyroxine  75 mcg Oral Q0600   mupirocin ointment  1 application. Topical BID   nortriptyline  40 mg Oral QHS    Continuous Infusions:  lactated ringers 75 mL/hr at 12/11/21 1033    PRN Meds: acetaminophen **OR** acetaminophen, ALPRAZolam, metoprolol tartrate  Physical Exam Constitutional:      General: She is not in acute distress.    Appearance: She is ill-appearing.  Pulmonary:     Effort: Pulmonary effort is normal.  Skin:    General: Skin is warm and dry.  Neurological:     Mental Status: She is alert.     Comments: Oriented to self            Vital Signs: BP (!) 145/59 (BP Location: Left Arm)   Pulse 92   Temp 98.2 F (36.8 C) (Oral)   Resp 18   Ht '5\' 6"'$  (1.676 m)   Wt 76.1 kg   SpO2 99%   BMI 27.08 kg/m  SpO2: SpO2: 99 % O2 Device: O2 Device: Room Air O2 Flow Rate:    Intake/output summary:  Intake/Output Summary (Last 24 hours) at 12/12/2021 1116 Last data filed at 12/12/2021 0330 Gross per 24 hour  Intake 0 ml  Output 450 ml  Net -450 ml   LBM: Last BM Date : 12/08/21 Baseline Weight: Weight: 73.4 kg Most recent weight: Weight: 76.1 kg       Palliative Assessment/Data: PPS 30%    Flowsheet Rows    Flowsheet Row Most  Recent Value  Intake Tab   Referral Department Hospitalist  Unit at Time of Referral Cardiac/Telemetry Unit  Palliative Care Primary Diagnosis Neurology  Date Notified 12/11/21  Palliative Care Type New Palliative care  Reason for referral Clarify Goals of Care  Date of Admission 12/08/21  Date first seen by Palliative Care 12/11/21  # of days Palliative referral response time 0 Day(s)  # of days IP prior to Palliative referral 3  Clinical Assessment   Psychosocial & Spiritual Assessment   Palliative Care Outcomes        Patient Active Problem List   Diagnosis Date Noted   Metabolic acidosis 61/44/3154   Normocytic anemia 12/11/2021   Thrombocytopenia ([place]) 12/11/2021   Memory deficit 00/86/7619   Acute metabolic encephalopathy 50/93/2671   Syncope and collapse 12/09/2021   Stroke-like symptom 12/09/2021   SIRS (systemic inflammatory response syndrome) ([name]) 12/09/2021   Orthostatic hypotension 12/09/2021   Leg ulcer, left ([place]) 10/13/2021   Eye tearing, left 02/19/2020   TMJ syndrome, left 02/19/2020   URI (upper respiratory infection) 01/24/2020   Lump in neck  06/08/2018   Sleep difficulties 03/31/2018   Persistent atrial fibrillation ([place]) 09/06/2017   TIA (transient ischemic attack) 07/12/2017   Bradycardia 04/12/2017   Depression 01/29/2017   Fall    Aphasia as late effect of cerebrovascular accident (CVA)    Acute renal failure superimposed on stage 4 chronic kidney disease ([place]) 01/11/2017   Chronic diastolic heart failure ([name])    Atrial flutter ([place]) 12/22/2016   Spastic hemiplegia of right dominant side as late effect of cerebral infarction ([place])    Anemia of chronic disease    Hypertension    Dysphagia, post-stroke    Anterior cerebral circulation hemorrhagic infarction ([place]) 12/03/2016   Right hemiparesis ([place])    Nontraumatic subcortical hemorrhage of left cerebral hemisphere ([name])    Mild aortic regurgitation 08/20/2016   Bilateral edema of lower  extremity 04/07/2016   Pancreatic duct dilated 01/09/2016   Stage 4 chronic kidney disease ([place]) 12/25/2015   Mild tricuspid regurgitation 12/25/2015   Mild mitral regurgitation 12/25/2015   Diabetes mellitus without complication ([place]) 73/22/0254   Abdominal pain, chronic, right upper quadrant 06/04/2015   Chronic low back pain    Pain in thoracic spine 04/18/2010   Coronary atherosclerosis 01/24/2009   ARTHRITIS, LEFT KNEE 12/13/2008   Hypothyroidism 11/21/2007   Hyperlipidemia 11/09/2007   Osteopenia 11/09/2007    Palliative Care Assessment & Plan   HPI: 81 y.o. female  with past medical history of  A-fib on Eliquis, chronic HFpEF, HTN, HLD, hypothyroidism, and CKD 4 admitted on 12/08/2021 with episode of unwitnessed unresponsive episode at home.  Apparently patient was found unresponsive in her bathroom with a heart rate of 30 and her right arm was contractured.  Chest x-ray negative, head CT with no acute changes, MRI negative, and UA negative.  Symptoms thought to be related to orthostatic hypotension.  Per family, patient initially responded well to fluids with improvement mental status but throughout mental status hospitalization has declined; delirium suspected.  Family has requested repeat UA.  PMT consulted to discuss goals of care.  Assessment: Patient with significant improvement in mental status today.  Eating better.  UA negative and antibiotics discontinued.  Labs also better including hemoglobin and creatinine.  Daughter at bedside and update provided.  They have no questions or concerns and are looking forward to taking patient home.  Called son who is HCPOA.  Update provided as above.  All questions and concerns answered.  We did revisit CODE STATUS and son confirms they have all agreed to DNR status.  Recommendations/Plan: Significantly improved CODE STATUS changed to DNR Continue current care with delirium precautions and allow time for outcomes, likely discharge soon  per primary  Code Status: DNR  Care plan was discussed with patient, son, daughter  Thank you for allowing the Palliative Medicine Team to assist in the care of this patient.  *Please note that this is a verbal dictation therefore any spelling or grammatical errors are due to the "[place] [employer]" system interpretation.  Juel Burrow, DNP, [facility] Palliative Medicine Team Team Phone # [phone]  Pager [phone]

## 2021-12-12 NOTE — TOC Progression Note (Signed)
Transition of Care [facility]) - Progression Note    Patient Details  Name: Valerie Mcclure MRN: [id number] Date of Birth: 02/10/1941  [facility]) CM/SW [place], [place] Phone Number: 12/12/2021, 1:58 PM  Clinical Narrative:   CSW alerted by PT of need for home health and RW and 3N1. CSW met with patient's daughter at bedside to discuss, preference for [facility] as they've used them in the past. Alvis Lemmings accepted referral for PT and OT. RW and 3N1 ordered with [employer]. CSW to follow for any additional needs for discharge.    Expected Discharge Plan: [place] Barriers to Discharge: Continued Medical Work up  Expected Discharge Plan and Services Expected Discharge Plan: [place]   Discharge Planning Services: CM Consult Post Acute Care Choice: [place] arrangements for the past 2 months: Single Family Home                 DME Arranged: 3-N-1, Walker rolling DME Agency: [employer] Date DME Agency Contacted: 12/12/21     HH Arranged: OT, PT Micro Agency: [place] Date [place] Agency Contacted: 12/12/21   Representative spoke with at [place]: Cindie   Social Determinants of Health ([place]) Interventions    Readmission Risk Interventions     View : No data to display.

## 2021-12-13 ENCOUNTER — Inpatient Hospital Stay (HOSPITAL_COMMUNITY): Payer: HMO

## 2021-12-13 DIAGNOSIS — D696 Thrombocytopenia, unspecified: Secondary | ICD-10-CM

## 2021-12-13 DIAGNOSIS — N179 Acute kidney failure, unspecified: Secondary | ICD-10-CM | POA: Diagnosis not present

## 2021-12-13 DIAGNOSIS — R6510 Systemic inflammatory response syndrome (SIRS) of non-infectious origin without acute organ dysfunction: Secondary | ICD-10-CM

## 2021-12-13 DIAGNOSIS — R2990 Unspecified symptoms and signs involving the nervous system: Secondary | ICD-10-CM

## 2021-12-13 DIAGNOSIS — R413 Other amnesia: Secondary | ICD-10-CM

## 2021-12-13 DIAGNOSIS — R531 Weakness: Secondary | ICD-10-CM

## 2021-12-13 DIAGNOSIS — G9341 Metabolic encephalopathy: Secondary | ICD-10-CM | POA: Diagnosis not present

## 2021-12-13 DIAGNOSIS — D649 Anemia, unspecified: Secondary | ICD-10-CM

## 2021-12-13 DIAGNOSIS — E872 Acidosis, unspecified: Secondary | ICD-10-CM

## 2021-12-13 DIAGNOSIS — R471 Dysarthria and anarthria: Secondary | ICD-10-CM | POA: Diagnosis not present

## 2021-12-13 DIAGNOSIS — I951 Orthostatic hypotension: Secondary | ICD-10-CM

## 2021-12-13 DIAGNOSIS — L98429 Non-pressure chronic ulcer of back with unspecified severity: Secondary | ICD-10-CM

## 2021-12-13 DIAGNOSIS — R55 Syncope and collapse: Secondary | ICD-10-CM | POA: Diagnosis not present

## 2021-12-13 DIAGNOSIS — I4819 Other persistent atrial fibrillation: Secondary | ICD-10-CM

## 2021-12-13 DIAGNOSIS — F3289 Other specified depressive episodes: Secondary | ICD-10-CM

## 2021-12-13 LAB — CULTURE, BLOOD (SINGLE): Culture: NO GROWTH

## 2021-12-13 LAB — TROPONIN I (HIGH SENSITIVITY)
Troponin I (High Sensitivity): 59 ng/L — ABNORMAL HIGH (ref <18)
Troponin I (High Sensitivity): 116 ng/L (ref <18)
Troponin I (High Sensitivity): 171 ng/L (ref <18)

## 2021-12-13 LAB — BASIC METABOLIC PANEL
Anion gap: 7 (ref 5–15)
BUN: 11 mg/dL (ref 8–23)
CO2: 20 mmol/L — ABNORMAL LOW (ref 22–32)
Calcium: 8.6 mg/dL — ABNORMAL LOW (ref 8.9–10.3)
Chloride: 113 mmol/L — ABNORMAL HIGH (ref 98–111)
Creatinine, Ser: 1.22 mg/dL — ABNORMAL HIGH (ref 0.44–1.00)
GFR, Estimated: 44 mL/min — ABNORMAL LOW (ref >60)
Glucose, Bld: 123 mg/dL — ABNORMAL HIGH (ref 70–99)
Potassium: 3.9 mmol/L (ref 3.5–5.1)
Sodium: 140 mmol/L (ref 135–145)

## 2021-12-13 LAB — BRAIN NATRIURETIC PEPTIDE: B Natriuretic Peptide: 1315.4 pg/mL — ABNORMAL HIGH (ref 0.0–100.0)

## 2021-12-13 MED ORDER — SODIUM CHLORIDE 0.45 % IV SOLN: INTRAVENOUS | Status: DC

## 2021-12-13 MED ORDER — FUROSEMIDE 10 MG/ML IJ SOLN
10.0000 mg | Freq: Once | INTRAMUSCULAR | Status: AC
  Administered 2021-12-13: 10 mg via INTRAVENOUS
  Filled 2021-12-13: qty 4

## 2021-12-13 MED ORDER — METOPROLOL TARTRATE 5 MG/5ML IV SOLN
5.0000 mg | Freq: Once | INTRAVENOUS | Status: AC
  Administered 2021-12-13: 5 mg via INTRAVENOUS
  Filled 2021-12-13: qty 5

## 2021-12-13 MED ORDER — BISACODYL 10 MG RE SUPP
10.0000 mg | Freq: Once | RECTAL | Status: AC
  Administered 2021-12-13: 10 mg via RECTAL
  Filled 2021-12-13: qty 1

## 2021-12-13 MED ORDER — METOPROLOL TARTRATE 25 MG PO TABS
25.0000 mg | ORAL_TABLET | Freq: Three times a day (TID) | ORAL | Status: DC
Start: 2021-12-13 — End: 2021-12-14
  Administered 2021-12-13 – 2021-12-14 (×3): 25 mg via ORAL
  Filled 2021-12-13 (×3): qty 1

## 2021-12-13 MED ORDER — METOPROLOL TARTRATE 25 MG PO TABS
25.0000 mg | ORAL_TABLET | Freq: Two times a day (BID) | ORAL | Status: DC
  Administered 2021-12-13: 25 mg via ORAL
  Filled 2021-12-13: qty 1

## 2021-12-13 NOTE — Progress Notes (Signed)
Pt noted to be short of breath and wheezing. Provider informed. IV fluids have been discontinued.

## 2021-12-13 NOTE — Progress Notes (Signed)
81 MD Paged with message: Valerie Mcclure in room requests MD to answer questions on Troponin, EKGs, Plan, etc.

## 2021-12-13 NOTE — Significant Event (Addendum)
Patient last night went into A-fib with RVR was given IV metoprolol 5 mg 2 doses and p.o. metoprolol was started.  At around 6:30 AM patient became more short of breath hypoxic and patient's saturation improved after patient was placed on 2 L oxygen.  On exam at bedside patient not in distress appears mildly short of breath.  Wheezing on exam bilaterally.  JVD not appreciated.  Patient denies any chest pain.  Patient's IV fluid was stopped.  Will order Lasix 10 mg IV may need further doses.  Ordered EKG, chest x-ray stat along with BNP.  Suspect patient is mildly fluid overloaded.  Gean Birchwood

## 2021-12-13 NOTE — Progress Notes (Signed)
Progress Note  Patient: Valerie Mcclure MWU:[id number] DOB: June 30, 1941  DOA: 12/08/2021  DOS: 12/13/2021    Brief hospital course: Valerie Mcclure is an 81 y.o. female with a history of persistent AFib on eliquis, chronic HFpEF, HTN, HLD, hypothyroidism, CKD 4 presented to the ED 5/22 after being found unresponsive in her bathroom with a heart rate of 30 and her right arm was contractured. ECG in ED showed rate-controlled AFib, labs showed AKI hypoalbuminemia, anemia. Chest x-ray no active disease, CT head code stroke no acute finding, redemonstration of chronic left MCA territory infarct within the left temporal lobe insula and basal ganglia chronic small vessel changes. Neurology was consulted, patient was admitted for further management. MRI showed no acute stroke.   Metoprolol was held with improvement in bradycardic rate. IVF given with improvement in renal function. On 5/27 she developed RVR for which metoprolol was restarted with improvement.   Assessment and Plan: Syncope, found down: No acute stroke on imaging. Suspect dehydration w/AKI caused weakness. No localizing symptoms or signs of infection found.  - Orthostatic vital signs are negative today.   Persistent atrial flutter / atrial fibrillation with RVR: Provoked in setting of holding beta blocker on admission due to concern for bradycardic rate and syncope.  - Continue eliquis - Restarted metoprolol at home dose this morning. Will monitor rates/cardiac monitor and orthostatic vital signs. ADDENDUM: Rates in 100's and BP a bit up without orthostatic drop, will augment metoprolol and continue cardiac monitoring.   AKI on stage IV CKD: Improving. NAGMA also improving with fluids.  - Avoid nephrotoxins.  - Given dose of lasix this morning for concern of overload. Indeed BNP is elevated. Will plan to restart home torsemide in AM.   Chronic HFpEF, HTN:  - Hold lisinopril, hydralazine for now. Augment BB as above  Hypoxia:  Seems to have resolved. No wheezing on exam currently.  - Incentive spirometry. Does have some crackles but not peripherally overloaded and CXR on my personal review does not suggest pulmonary edema. More consistent with atelectasis due to immobility.  - Get OOB several times per day w/assistance. Check ambulatory pulse oximetry.   Demand myocardial ischemia: In setting of RVR. Mildly elevated Tn without chest pain.  - Trend troponin while controlling rate. Pt on anticoagulation, beta blocker and statin.  Stroke-like symptom: Code stroke work up done with CT head in ED. MRI brain/MR angio head-no evidence of acute or subacute stroke, mildly limited by motion.Echo EF 65 to 70%, indeterminate diastolic parameters, normal RV function, mitral aortic valve grossly normal moderate AR.Carotid duplex unremarkable.   Acute metabolic encephalopathy suspect delirium Baseline memory deficit/cognitive impairment: Patient with delirium, so far infectious etiology unremarkable repeat UA-negative leukocytes and nitrite. MRI brain no acute finding.  Mostly hypotensive orthostatic and component of dehydration and AKI.  Mental status significantly improved. Continue IV fluid hydration supportive care delirium precaution minimize sedatives.  Continue PT OT and ambulation.    SIRS: Sepsis ruled out. blood culture ordered sent so far negative, UA  cxr 5/22- no e/o infection.  Remains afebrile leukocytosis initially mild and now resolved, patient on ceftriaxone..  Family endorses similar presentation when patient has UTI repeat UA is unremarkable.  Discontinue antibiotics.   Aortic regurgitation: Moderate by echo.  - Cardiology follow up.  Hypothyroidism: TSH mildly elevated at 6.8, free T4 wnl. Clinically not hypothyroid.  - Continue synthroid.  Lower extremity edema: chronic. No DVT on U/S. ABIs were also checked and normal.   Depression: Quiescent  HLD: Statin  AOCD: Stable.   DNR  Subjective: Patient  had return of AFlutter with rapid rate overnight and was said to have been hypoxic. She states she has and had no chest pain. She does not feel short of breath, in fact was bothered by the oxygen when it was on her briefly. She gets around with a walker here. She and her daughter at bedside state she should be using a walker at home PTA but just doesn't use it.   Objective: Vitals:   12/12/21 1900 12/12/21 2300 12/13/21 0303 12/13/21 0747  BP: (!) 163/63 (!) 147/88 (!) 154/75 133/81  Pulse: 99 100 (!) 110 (!) 105  Resp: '19 20  20  '$ Temp: 97.9 F (36.6 C) 98.1 F (36.7 C) 98.8 F (37.1 C) 98.3 F (36.8 C)  TempSrc: Oral Oral Oral Oral  SpO2: 100% 99% 97% 99%  Weight:      Height:       Gen: Elderly, frail, pleasant, dysarthric female in no distress Pulm: Nonlabored breathing room air. Minimal crackles at bases. No wheezing this morning.  CV: Rapid, irregular. Mild diastolic murmur at RUSB without rub, or gallop. No JVD, no significant dependent edema. GI: Abdomen soft, non-tender, non-distended, with normoactive bowel sounds.  Ext: Warm, no deformities Skin: No acute rashes, lesions or ulcers on visualized skin. Neuro: Alert and oriented. Speech is dysarthric which patient and daughter report is at baseline. No acute focal neurological deficits, appears to have some limited right sided movement. Psych: Judgement and insight appear fair. Mood euthymic & affect congruent. Behavior is appropriate.    Telemetry (personal review): atypical atrial flutter with BBB is persistent with rates current still 100-120.  Data Personally reviewed: CBC: Recent Labs  Lab 12/08/21 1628 12/08/21 1629 12/09/21 0409 12/10/21 0252 12/11/21 0215 12/11/21 1951 12/12/21 0452  WBC 10.6*  --  11.3* 9.1 7.4  --  6.7  NEUTROABS 7.9*  --  8.7*  --   --   --   --   HGB 11.4*   < > 10.1* 9.2* 7.7* 8.9* 9.3*  HCT 35.8*   < > 32.2* 28.8* 24.2* 27.9* 28.8*  MCV 99.4  --  98.5 97.3 98.8  --  97.0  PLT 204  --   166 156 146*  --  140*   < > = values in this interval not displayed.   Basic Metabolic Panel: Recent Labs  Lab 12/09/21 0409 12/10/21 0252 12/11/21 0215 12/12/21 0452 12/13/21 0333  NA 139 139 140 140 140  K 4.2 4.5 3.9 4.0 3.9  CL 114* 116* 118* 116* 113*  CO2 15* 16* 16* 17* 20*  GLUCOSE 92 96 141* 95 123*  BUN 55* 40* 26* 15 11  CREATININE 2.16* 1.93* 1.58* 1.38* 1.22*  CALCIUM 8.3* 8.3* 8.2* 8.4* 8.6*  MG 1.7 1.7 1.7 1.7  --    GFR: Estimated Creatinine Clearance: 37 mL/min (A) (by C-G formula based on SCr of 1.22 mg/dL (H)). Liver Function Tests: Recent Labs  Lab 12/08/21 1628 12/09/21 0409 12/10/21 0252 12/11/21 0215 12/12/21 0452  AST '25 24 26 24 23  '$ ALT '26 24 23 21 21  '$ ALKPHOS 79 67 67 56 59  BILITOT 0.4 0.5 0.8 0.2* 0.5  PROT 5.8* 5.0* 4.9* 4.7* 5.0*  ALBUMIN 2.6* 2.2* 2.1* 2.4* 2.4*   No results for input(s): LIPASE, AMYLASE in the last 168 hours. No results for input(s): AMMONIA in the last 168 hours. Coagulation Profile: Recent Labs  Lab 12/08/21 1628  INR 1.4*   Cardiac Enzymes: Recent Labs  Lab 12/09/21 0409  CKTOTAL 55   BNP (last 3 results) No results for input(s): PROBNP in the last 8760 hours. HbA1C: No results for input(s): HGBA1C in the last 72 hours. CBG: Recent Labs  Lab 12/08/21 1622  GLUCAP 119*   Lipid Profile: No results for input(s): CHOL, HDL, LDLCALC, TRIG, CHOLHDL, LDLDIRECT in the last 72 hours. Thyroid Function Tests: No results for input(s): TSH, T4TOTAL, FREET4, T3FREE, THYROIDAB in the last 72 hours. Anemia Panel: No results for input(s): VITAMINB12, FOLATE, FERRITIN, TIBC, IRON, RETICCTPCT in the last 72 hours. Urine analysis:    Component Value Date/Time   COLORURINE YELLOW 12/11/2021 1450   APPEARANCEUR CLEAR 12/11/2021 1450   LABSPEC 1.014 12/11/2021 1450   PHURINE 5.0 12/11/2021 1450   GLUCOSEU NEGATIVE 12/11/2021 1450   GLUCOSEU NEGATIVE 11/18/2010 0859   HGBUR NEGATIVE 12/11/2021 1450   HGBUR  negative 12/13/2008 0944   BILIRUBINUR NEGATIVE 12/11/2021 1450   KETONESUR NEGATIVE 12/11/2021 1450   PROTEINUR NEGATIVE 12/11/2021 1450   UROBILINOGEN 0.2 11/18/2010 0859   NITRITE NEGATIVE 12/11/2021 1450   LEUKOCYTESUR NEGATIVE 12/11/2021 1450   Recent Results (from the past 240 hour(s))  Culture, blood (single)     Status: None   Collection Time: 12/08/21  6:05 PM   Specimen: BLOOD  Result Value Ref Range Status   Specimen Description BLOOD RIGHT ANTECUBITAL  Final   Special Requests   Final    BOTTLES DRAWN AEROBIC AND ANAEROBIC Blood Culture results may not be optimal due to an inadequate volume of blood received in culture bottles   Culture   Final    NO GROWTH 5 DAYS Performed at [facility], [place] [address] [place], [place] [address]    Report Status 12/13/2021 FINAL  Final     DG CHEST PORT 1 VIEW  Result Date: 12/13/2021 CLINICAL DATA:  Shortness of breath. EXAM: PORTABLE CHEST 1 VIEW COMPARISON:  12/08/2021 FINDINGS: 0723 hours. Low IM film. The cardio pericardial silhouette is enlarged. Interval increase in bibasilar atelectasis. No substantial pleural effusion or pulmonary edema. The visualized bony structures of the thorax are unremarkable. Telemetry leads overlie the chest. IMPRESSION: Interval progression of bibasilar atelectasis on this low volume film. Electronically Signed   By: Misty Stanley M.D.   On: 12/13/2021 07:49   VAS [place] ABI WITH/WO TBI  Result Date: 12/12/2021  LOWER EXTREMITY DOPPLER STUDY Patient Name:  Valerie Mcclure  Date of Exam:   12/12/2021 Medical Rec #: [id number]            Accession #:    [id number] Date of Birth: 11-Feb-1941            Patient Gender: F Patient Age:   58 years Exam Location:  [facility] Procedure:      VAS [place] ABI WITH/WO TBI Referring Phys: PRANAV PATEL --------------------------------------------------------------------------------  Indications: Peripheral artery disease. High Risk Factors: Hyperlipidemia,  Diabetes.  Comparison Study: No prior studies. Performing Technologist: Carlos Levering RVT  Examination Guidelines: A complete evaluation includes at minimum, Doppler waveform signals and systolic blood pressure reading at the level of bilateral brachial, anterior tibial, and posterior tibial arteries, when vessel segments are accessible. Bilateral testing is considered an integral part of a complete examination. Photoelectric Plethysmograph (PPG) waveforms and toe systolic pressure readings are included as required and additional duplex testing as needed. Limited examinations for reoccurring indications may be performed as noted.  ABI Findings: +---------+------------------+-----+---------+--------+ Right  Rt Pressure (mmHg)IndexWaveform Comment  +---------+------------------+-----+---------+--------+ Brachial 162                    triphasic         +---------+------------------+-----+---------+--------+ PTA      185               1.14 biphasic          +---------+------------------+-----+---------+--------+ DP       182               1.12 biphasic          +---------+------------------+-----+---------+--------+ Great Toe147               0.91                   +---------+------------------+-----+---------+--------+ +---------+------------------+-----+---------+-------+ Left     Lt Pressure (mmHg)IndexWaveform Comment +---------+------------------+-----+---------+-------+ Brachial 145                    triphasic        +---------+------------------+-----+---------+-------+ PTA      174               1.07 biphasic         +---------+------------------+-----+---------+-------+ DP       189               1.17 biphasic         +---------+------------------+-----+---------+-------+ Great Toe119               0.73                  +---------+------------------+-----+---------+-------+ +-------+-----------+-----------+------------+------------+  ABI/TBIToday's ABIToday's TBIPrevious ABIPrevious TBI +-------+-----------+-----------+------------+------------+ Right  1.14       0.91                                +-------+-----------+-----------+------------+------------+ Left   1.17       0.73                                +-------+-----------+-----------+------------+------------+  Summary: Right: Resting right ankle-brachial index is within normal range. No evidence of significant right lower extremity arterial disease. The right toe-brachial index is normal. Left: Resting left ankle-brachial index is within normal range. No evidence of significant left lower extremity arterial disease. The left toe-brachial index is normal. *See table(s) above for measurements and observations.  Electronically signed by Servando Snare MD on 12/12/2021 at 7:02:41 PM.    Final    VAS [place] LOWER EXTREMITY VENOUS (DVT)  Result Date: 12/11/2021  Lower Venous DVT Study Patient Name:  Valerie Mcclure  Date of Exam:   12/11/2021 Medical Rec #: [id number]            Accession #:    [id number] Date of Birth: 1941-07-16            Patient Gender: F Patient Age:   64 years Exam Location:  [facility] Procedure:      VAS [place] LOWER EXTREMITY VENOUS (DVT) Referring Phys: PRANAV PATEL --------------------------------------------------------------------------------  Indications: Edema.  Risk Factors: None identified. Limitations: Body habitus, poor ultrasound/tissue interface and patient positioning, patient immobility, patient pain tolerance, patient somnolence. Comparison Study: No prior studies. Performing Technologist: Oliver Hum RVT  Examination Guidelines: A complete evaluation includes B-mode imaging, spectral Doppler, color Doppler, and power Doppler as needed of all  accessible portions of each vessel. Bilateral testing is considered an integral part of a complete examination. Limited examinations for reoccurring indications may be performed as noted.  The reflux portion of the exam is performed with the patient in reverse Trendelenburg.  +---------+---------------+---------+-----------+----------+-------------------+ RIGHT    CompressibilityPhasicitySpontaneityPropertiesThrombus Aging      +---------+---------------+---------+-----------+----------+-------------------+ CFV      Full           Yes      Yes                                      +---------+---------------+---------+-----------+----------+-------------------+ SFJ      Full                                                             +---------+---------------+---------+-----------+----------+-------------------+ FV Prox  Full                                                             +---------+---------------+---------+-----------+----------+-------------------+ FV Mid   Full                                                             +---------+---------------+---------+-----------+----------+-------------------+ FV Distal               Yes      Yes                                      +---------+---------------+---------+-----------+----------+-------------------+ PFV      Full                                                             +---------+---------------+---------+-----------+----------+-------------------+ POP      Full           Yes      Yes                                      +---------+---------------+---------+-----------+----------+-------------------+ PTV      Full                                                             +---------+---------------+---------+-----------+----------+-------------------+ PERO  Patency shown with                                                        color doppler       +---------+---------------+---------+-----------+----------+-------------------+   +---------+---------------+---------+-----------+----------+--------------+  LEFT     CompressibilityPhasicitySpontaneityPropertiesThrombus Aging +---------+---------------+---------+-----------+----------+--------------+ CFV      Full           Yes      Yes                                 +---------+---------------+---------+-----------+----------+--------------+ SFJ      Full                                                        +---------+---------------+---------+-----------+----------+--------------+ FV Prox  Full                                                        +---------+---------------+---------+-----------+----------+--------------+ FV Mid   Full                                                        +---------+---------------+---------+-----------+----------+--------------+ FV Distal               Yes      Yes                                 +---------+---------------+---------+-----------+----------+--------------+ PFV      Full                                                        +---------+---------------+---------+-----------+----------+--------------+ POP      Full           Yes      Yes                                 +---------+---------------+---------+-----------+----------+--------------+ PTV      Full                                                        +---------+---------------+---------+-----------+----------+--------------+ PERO     Full                                                        +---------+---------------+---------+-----------+----------+--------------+  Summary: RIGHT: - There is no evidence of deep vein thrombosis in the lower extremity. However, portions of this examination were limited- see technologist comments above.  - No cystic structure found in the popliteal fossa.  LEFT: - There is no evidence of deep vein thrombosis in the lower extremity. However, portions of this examination were limited- see technologist comments above.  - No cystic structure found in the  popliteal fossa.  *See table(s) above for measurements and observations. Electronically signed by Jamelle Haring on 12/11/2021 at 4:52:09 PM.    Final     Family Communication: Melanie, daughter at bedside  Disposition: Status is: Inpatient Remains inpatient appropriate because: remains in rapid atrial flutter. Restarting metoprolol though she was admitted with syncope and also noted to be bradycardic, so will require cardiac monitoring, orthostatic vital signs prior to safely discharging back home. Planned Discharge Destination: Home with [place], MD 12/13/2021 10:29 AM Page by [email or web address]

## 2021-12-13 NOTE — Progress Notes (Addendum)
1008 Call received from Lab re Troponin 116. Paged MD R. [name] via [place].   Right buttocks mepilex dressing changed.

## 2021-12-13 NOTE — Progress Notes (Addendum)
[place] attempted to get patient to sit up on the side of bed, but she could not sit using her own accessory muscles on the edge of bed. Patient sat up by raising HOB to 45 degrees. Patient's ortho static BP Was only taken lying, and then sitting up in bed.  Patient cannot yet ambulate so O2 saturations were not measured while ambulating. However, she is at 100% saturations while sitting.   12/13/21 1224  Orthostatic Lying   BP- Lying 135/75  Pulse- Lying 110  Orthostatic Sitting  BP- Sitting 136/76  Pulse- Sitting 115

## 2021-12-13 NOTE — Progress Notes (Signed)
Pt noted to be short of breath, O2 checked: 89% RA. Pt placed on 2L/[place]. O2 increaesd to 96%. Provider paged to inform. Pt placed new orders.

## 2021-12-13 NOTE — Progress Notes (Signed)
Pt HR up to 120s, provider paged to inform. Received order for '5mg'$  of metoprolol IV. Given per order, effective.

## 2021-12-13 NOTE — Plan of Care (Signed)
?  Problem: Education: ?Goal: Knowledge of General Education information will improve ?Description: Including pain rating scale, medication(s)/side effects and non-pharmacologic comfort measures ?Outcome: Progressing ?  ?Problem: Health Behavior/Discharge Planning: ?Goal: Ability to manage health-related needs will improve ?Outcome: Progressing ?  ?Problem: Clinical Measurements: ?Goal: Ability to maintain clinical measurements within normal limits will improve ?Outcome: Progressing ?Goal: Will remain free from infection ?Outcome: Progressing ?Goal: Diagnostic test results will improve ?Outcome: Progressing ?Goal: Respiratory complications will improve ?Outcome: Progressing ?Goal: Cardiovascular complication will be avoided ?Outcome: Progressing ?  ?Problem: Activity: ?Goal: Risk for activity intolerance will decrease ?Outcome: Progressing ?  ?Problem: Nutrition: ?Goal: Adequate nutrition will be maintained ?Outcome: Progressing ?  ?Problem: Coping: ?Goal: Level of anxiety will decrease ?Outcome: Progressing ?  ?Problem: Elimination: ?Goal: Will not experience complications related to bowel motility ?Outcome: Progressing ?Goal: Will not experience complications related to urinary retention ?Outcome: Progressing ?  ?Problem: Pain Managment: ?Goal: General experience of comfort will improve ?Outcome: Progressing ?  ?Problem: Safety: ?Goal: Ability to remain free from injury will improve ?Outcome: Progressing ?  ?Problem: Skin Integrity: ?Goal: Risk for impaired skin integrity will decrease ?Outcome: Progressing ?  ?Problem: Education: ?Goal: Knowledge of disease or condition will improve ?Outcome: Progressing ?Goal: Knowledge of secondary prevention will improve (SELECT ALL) ?Outcome: Progressing ?Goal: Knowledge of patient specific risk factors will improve (INDIVIDUALIZE FOR PATIENT) ?Outcome: Progressing ?  ?

## 2021-12-13 NOTE — Plan of Care (Signed)
  Problem: Education: Goal: Knowledge of General Education information will improve Description: Including pain rating scale, medication(s)/side effects and non-pharmacologic comfort measures Outcome: Progressing   Problem: Health Behavior/Discharge Planning: Goal: Ability to manage health-related needs will improve Outcome: Progressing   Problem: Clinical Measurements: Goal: Ability to maintain clinical measurements within normal limits will improve Outcome: Progressing Goal: Will remain free from infection Outcome: Progressing Goal: Diagnostic test results will improve Outcome: Progressing Goal: Respiratory complications will improve Outcome: Progressing Goal: Cardiovascular complication will be avoided Outcome: Progressing   Problem: Activity: Goal: Risk for activity intolerance will decrease Outcome: Progressing   Problem: Nutrition: Goal: Adequate nutrition will be maintained Outcome: Progressing   Problem: Coping: Goal: Level of anxiety will decrease Outcome: Progressing   Problem: Elimination: Goal: Will not experience complications related to bowel motility Outcome: Progressing Goal: Will not experience complications related to urinary retention Outcome: Progressing   Problem: Pain Managment: Goal: General experience of comfort will improve Outcome: Progressing   Problem: Safety: Goal: Ability to remain free from injury will improve Outcome: Progressing   Problem: Skin Integrity: Goal: Risk for impaired skin integrity will decrease Outcome: Progressing   Problem: Education: Goal: Knowledge of disease or condition will improve 12/13/2021 1147 by Hulen Luster, RN Outcome: Progressing 12/13/2021 1147 by Hulen Luster, RN Outcome: Progressing Goal: Knowledge of secondary prevention will improve (SELECT ALL) 12/13/2021 1147 by Hulen Luster, RN Outcome: Progressing 12/13/2021 1147 by Hulen Luster, RN Outcome: Progressing Goal: Knowledge of patient specific risk  factors will improve (INDIVIDUALIZE FOR PATIENT) 12/13/2021 1147 by Hulen Luster, RN Outcome: Progressing 12/13/2021 1147 by Hulen Luster, RN Outcome: Progressing

## 2021-12-13 NOTE — Progress Notes (Signed)
Pt HR increasing to 120s, Provider paged to inform, received order for '5mg'$  IV metoprolol and PO dose of metoprolol. Medication effective. Pt resting

## 2021-12-14 DIAGNOSIS — G9341 Metabolic encephalopathy: Secondary | ICD-10-CM | POA: Diagnosis not present

## 2021-12-14 DIAGNOSIS — R55 Syncope and collapse: Secondary | ICD-10-CM | POA: Diagnosis not present

## 2021-12-14 DIAGNOSIS — R471 Dysarthria and anarthria: Secondary | ICD-10-CM | POA: Diagnosis not present

## 2021-12-14 DIAGNOSIS — N179 Acute kidney failure, unspecified: Secondary | ICD-10-CM | POA: Diagnosis not present

## 2021-12-14 LAB — BASIC METABOLIC PANEL
Anion gap: 5 (ref 5–15)
BUN: 11 mg/dL (ref 8–23)
CO2: 22 mmol/L (ref 22–32)
Calcium: 8.3 mg/dL — ABNORMAL LOW (ref 8.9–10.3)
Chloride: 112 mmol/L — ABNORMAL HIGH (ref 98–111)
Creatinine, Ser: 1.39 mg/dL — ABNORMAL HIGH (ref 0.44–1.00)
GFR, Estimated: 38 mL/min — ABNORMAL LOW (ref >60)
Glucose, Bld: 119 mg/dL — ABNORMAL HIGH (ref 70–99)
Potassium: 3.7 mmol/L (ref 3.5–5.1)
Sodium: 139 mmol/L (ref 135–145)

## 2021-12-14 LAB — TROPONIN I (HIGH SENSITIVITY)
Troponin I (High Sensitivity): 254 ng/L
Troponin I (High Sensitivity): 196 ng/L

## 2021-12-14 MED ORDER — METOPROLOL TARTRATE 25 MG PO TABS
25.0000 mg | ORAL_TABLET | Freq: Two times a day (BID) | ORAL | Status: DC
  Administered 2021-12-14 – 2021-12-17 (×6): 25 mg via ORAL
  Filled 2021-12-14 (×6): qty 1

## 2021-12-14 NOTE — Progress Notes (Signed)
PT Cancellation Note  Patient Details Name: Valerie Mcclure MRN: [id number] DOB: 01/05/1941   Cancelled Treatment:    Reason Eval/Treat Not Completed: Patient declined, no reason specified. Pt eating upon arrival of PT, asking PT to return later in day for session. I reinforced that this session is to facilitate d/c, and the pt and her daughter continued to ask for me to return after she has finished lunch. Will return when pt ready for therapy.   West Carbo, PT, DPT   Acute Rehabilitation Department Pager #: [phone]   Valerie Mcclure 12/14/2021, 1:39 PM

## 2021-12-14 NOTE — Discharge Summary (Incomplete)
Physician Discharge Summary   Patient: Valerie Mcclure MRN: [id number] DOB: 27-Nov-1940  Admit date:     12/08/2021  Discharge date: {dischdate:26783}  Discharge Physician: Patrecia Pour   PCP: [name], MD   Recommendations at discharge:  {Tip this will not be part of the note when signed- Example include specific recommendations for outpatient follow-up, pending tests to follow-up on. (Optional):26781}  ***  Discharge Diagnoses: Principal Problem:   Syncope and collapse Active Problems:   Orthostatic hypotension   Stroke-like symptom   SIRS (systemic inflammatory response syndrome) ([name])   Bilateral edema of lower extremity   Acute renal failure ([name])   Chronic diastolic heart failure ([name])   Bradycardia   Persistent atrial fibrillation ([name])   Depression   Hypothyroidism   Hyperlipidemia   Dysarthria   Metabolic acidosis   Normocytic anemia   Thrombocytopenia ([name])   Memory deficit   Acute metabolic encephalopathy   General weakness   Skin ulcer of sacrum ([name])  Resolved Problems:   * No resolved hospital problems. *  Hospital Course: 81 year old female with history of persistent A-fib on Eliquis, chronic HFpEF, HTN, HLD, hypothyroidism, CKD 4 presented with an episode of unwitnessed unresponsive episode at home.  Apparently patient was found unresponsive in her bathroom with a heart rate of 30 and her right arm was contractured.  Patient was seen in the ED-EKG with A-fib rate controlled, labs showed AKI hypoalbuminemia, anemia.  Chest x-ray no active disease, CT head code stroke no acute finding, redemonstration of chronic left MCA territory infarct within the left temporal lobe insula and basal ganglia chronic small vessel changes, neurology was consulted, patient was admitted for further management.  She is being treated for syncope and collapse due to orthostatic hypotension, acute metabolic encephalopathy,no acute stroke on MRI.  Assessment and Plan: *  Syncope and collapse Severe orthostatic hypotension. Presents with an unwitnessed unresponsive event at home. Possibly while using bathroom. Denies any head injury or neck injury. Orthostatic positive.  Blood pressure dropped from 450 systolic to 70 systolic. Still appears dry on exam as evidenced by dry membranes, increased thirst, ARF -Cont hydration as tolerated  Stroke-like symptom Presented with an unwitnessed pons event at home followed by patient had some right facial droop and right arm contraction.  Code stroke called on admission. CT head negative for any acute abnormality.  MRI brain negative for acute stroke.  MR angio negative for any large vessel occlusion.  Speech therapy consulted recommend dysphagia 3 diet.  Neurology recommended continuing current regimen. PT and OT recommending acute inpatient rehab. Per CIR, does not appear to demonstrate medical necessity to support CIR -TOC following  SIRS (systemic inflammatory response syndrome) ([name]) Sepsis ruled out. Patient met SIRS criteria with hypothermia and tachypnea although I do not think the patient actually has sepsis. Follow-up on blood cultures. Patient was started on IV vancomycin and Rocephin which I we will narrow down to Rocephin only.  Low threshold to discontinue antibiotics. Monitor.  Acute renal failure ([name]) Metabolic acidosis. Baseline serum creatinine around 1.8.  On presentation serum creatinine 2.6 trending up to 2.7. Now trending down to 1.93 with fluids.  Monitor. Mild metabolic acidosis in the setting of worsening renal function.  Still appears clinically dehydrated with dry lips and mucus membranes Would cont IVF as tolerated LE edema noted in setting of low albumin. Will give one dose of IV albumin  Bilateral edema of lower extremity Third spacing in the setting of hypoalbuminemia. Possibility of  vascular disease cannot be ruled out. Given the chronicity of the process a DVT can also be  possible. ABI and Doppler were ordered, pending Patient went from Unna boots/compression stockings.  Persistent atrial fibrillation ([name]) No evidence of paroxysmal A-fib. Continue Eliquis. Cont lower dose metoprolol per above  Bradycardia Persistent A-fib. Reportedly patient's heart rate dropped down to 30s while the event happened. Patient does have chronic bradycardia history. Initially held all home antihypertensive regimen including AV blocker medications. On Eliquis for anticoagulation. Now tachycardic with stable BP, restarted metoprolol at lower dose of 12.74m bid with hold parameters  Chronic diastolic heart failure ([facility]aves Essential hypertension. Volume status appears adequate. Although at risk for worsening of lower extremity edema as she is  receiving IV fluid. Home regimen includes 20 mg torsemide daily, 25 mg Lopressor twice daily, lisinopril 2.5 mg daily, hydralazine 25 mg 3 times daily, on hold at admit -resumed lower dose metoprolol per above  Depression Cont home Pamelor and Prozac.  Hyperlipidemia Continuing statin.  Hypothyroidism Continue Synthroid.  TSH 6.8, free T4 1.09.      {Tip this will not be part of the note when signed Body mass index is 27.08 kg/m. , ,  (Optional):26781}  {(NOTE) Pain control PDMP Statment (Optional):26782} Consultants: *** Procedures performed: ***  Disposition: {Plan; Disposition:26390} Diet recommendation:  {Diet_Plan:26776} DISCHARGE MEDICATION: Allergies as of 12/14/2021       Reactions   Shrimp [shellfish Allergy] Anaphylaxis, Rash   Break outs and swelling of the throat   Valsartan Other (See Comments)   Renal failure   Tandem Plus [fefum-fepo-fa-b Cmp-c-zn-mn-cu] Nausea And Vomiting   Ivp Dye [iodinated Contrast Media] Rash   itching   Penicillins Itching, Rash   Has patient had a PCN reaction causing immediate rash, facial/tongue/throat swelling, SOB or lightheadedness with hypotension: Yes Has patient had  a PCN reaction causing severe rash involving mucus membranes or skin necrosis: No Has patient had a PCN reaction that required hospitalization: No Has patient had a PCN reaction occurring within the last 10 years: No If all of the above answers are "NO", then may proceed with Cephalosporin use.        Medication List     STOP taking these medications    benzonatate 100 MG capsule Commonly known as: Tessalon Perles   ciclopirox 0.77 % cream Commonly known as: LOPROX   hydrALAZINE 25 MG tablet Commonly known as: APRESOLINE   HYDROcodone bit-homatropine 5-1.5 MG/5ML syrup Commonly known as: HYCODAN   lisinopril 2.5 MG tablet Commonly known as: ZESTRIL   mupirocin ointment 2 % Commonly known as: BACTROBAN       TAKE these medications    acetaminophen 500 MG tablet Commonly known as: TYLENOL Take 1,000 mg by mouth every 6 (six) hours as needed for moderate pain.   atorvastatin 20 MG tablet Commonly known as: LIPITOR TAKE 1 TABLET(20 MG) BY MOUTH DAILY   CALCIUM-VITAMIN D PO Take 1 tablet by mouth daily.   clonazePAM 0.5 MG tablet Commonly known as: KLONOPIN Take 0.5 tablets (0.25 mg total) by mouth 2 (two) times daily as needed for anxiety.   Eliquis 2.5 MG Tabs tablet Generic drug: apixaban TAKE 1 TABLET(2.5 MG) BY MOUTH TWICE DAILY   FLUoxetine 40 MG capsule Commonly known as: PROZAC TAKE 1 CAPSULE(40 MG) BY MOUTH DAILY   levothyroxine 75 MCG tablet Commonly known as: SYNTHROID TAKE 1 TABLET(75 MCG) BY MOUTH DAILY BEFORE BREAKFAST. FOLLOW-UP   metoprolol tartrate 25 MG tablet Commonly known as: LOPRESSOR TAKE  1 TABLET(25 MG) BY MOUTH TWICE DAILY   nortriptyline 10 MG capsule Commonly known as: PAMELOR TAKE 4 CAPSULES(40 MG) BY MOUTH AT BEDTIME   torsemide 20 MG tablet Commonly known as: DEMADEX TAKE 1 TABLET(20 MG) BY MOUTH DAILY   vitamin B-12 100 MCG tablet Commonly known as: CYANOCOBALAMIN Take 100 mcg by mouth daily.                Durable Medical Equipment  (From admission, onward)           Start     Ordered   12/12/21 1316  For home use only DME 3 n 1  Once        12/12/21 1315   12/12/21 1316  For home use only DME Walker rolling  Once       Question Answer Comment  Walker: With 5 Inch Wheels   Patient needs a walker to treat with the following condition Weakness generalized      12/12/21 1315            Follow-up Information     Burns, Claudina Lick, MD Follow up.   Specialty: Internal Medicine Contact information: [address] [place] [address] [phone]                Discharge Exam: Danley Danker Weights   12/08/21 1600 12/09/21 0129  Weight: 73.4 kg 76.1 kg   ***  Condition at discharge: {DC Condition:26389}  The results of significant diagnostics from this hospitalization (including imaging, microbiology, ancillary and laboratory) are listed below for reference.   Imaging Studies: CT HEAD WO CONTRAST (5MM)  Result Date: 12/13/2021 CLINICAL DATA:  Mental status change of unknown etiology. EXAM: CT HEAD WITHOUT CONTRAST TECHNIQUE: Contiguous axial images were obtained from the base of the skull through the vertex without intravenous contrast. RADIATION DOSE REDUCTION: This exam was performed according to the departmental dose-optimization program which includes automated exposure control, adjustment of the mA and/or kV according to patient size and/or use of iterative reconstruction technique. COMPARISON:  MR brain 12/08/2021 FINDINGS: Brain: No evidence of acute infarction, hemorrhage, hydrocephalus, extra-axial collection or mass lesion/mass effect. Again noted are changes secondary to remote left MCA infarct involving the left basal ganglia, left temporal lobe, and left insula. Ex vacuo dilatation of the left lateral ventricle is noted and there is well layering in degeneration in the left cerebral peduncle. Prominence of the sulci and ventricles compatible with brain atrophy.  Vascular: No hyperdense vessel or unexpected calcification. Skull: Normal. Negative for fracture or focal lesion. Sinuses/Orbits: No acute finding. Other: None IMPRESSION: 1. No acute intracranial abnormalities. 2. Chronic left MCA infarct. 3. Chronic small vessel ischemic disease and brain atrophy. Electronically Signed   By: Kerby Moors M.D.   On: 12/13/2021 17:53   MR ANGIO HEAD WO CONTRAST  Result Date: 12/08/2021 CLINICAL DATA:  Right-sided facial droop, weakness, slurred speech, stroke suspected EXAM: MRI HEAD WITHOUT CONTRAST MRA HEAD WITHOUT CONTRAST TECHNIQUE: Multiplanar, multi-echo pulse sequences of the brain and surrounding structures were acquired without intravenous contrast. Angiographic images of the Circle of Willis were acquired using MRA technique without intravenous contrast. COMPARISON:  MRI head 07/08/2019, MRA head 07/13/2017, correlation is also made with CT head 12/08/2021 FINDINGS: MRI HEAD FINDINGS Brain: No restricted diffusion to suggest acute or subacute infarct. No acute hemorrhage, mass, mass effect, or midline shift. No hydrocephalus or extra-axial collection. Redemonstrated sequela of remote left MCA infarct involving the left basal ganglia, left temporal lobe, and left  insula, with unchanged ex vacuo dilatation of the left lateral ventricle and associated wallerian degeneration in the left cerebral peduncle. Additional T2 hyperintensity in the pons is favored to be the sequela of chronic small vessel ischemic disease. Vascular: Normal arterial flow voids. Skull and upper cervical spine: Normal marrow signal. Sinuses/Orbits: No acute finding. Other: Trace fluid in the mastoid air cells. MRA HEAD FINDINGS Evaluation is somewhat limited by motion artifact. Anterior circulation: Both internal carotid arteries are patent to the termini, without significant stenosis. A1 segments patent. Normal anterior communicating artery. Anterior cerebral arteries are patent to their distal  aspects, with apparent discontinuity in the mid ACAs likely related to motion. No M1 stenosis or occlusion. Normal MCA bifurcations. Distal MCA branches perfused and symmetric, with evaluation of the most distal aspects limited by motion. Previous suspected M2 stenosis may have been secondary to motion artifact on that study. Posterior circulation: Vertebral arteries patent to the vertebrobasilar junction without stenosis. Posterior inferior cerebral arteries patent bilaterally. Basilar patent to its distal aspect. Superior cerebellar arteries patent bilaterally. Patent right P1. The left P1 is not definitively visualized, with fetal or near fetal origin of the left PCA from a patent left posterior communicating artery. The right posterior communicating artery is not visualized. PCAs perfused to their distal aspects without stenosis. Anatomic variants: Fetal or near fetal origin of the left PCA. IMPRESSION: 1. No acute intracranial process. No evidence of acute or subacute infarct. 2. Evaluation is mildly limited by motion artifact. Within that limitation, no intracranial large vessel occlusion or significant stenosis. Electronically Signed   By: Merilyn Baba M.D.   On: 12/08/2021 23:19   MR BRAIN WO CONTRAST  Result Date: 12/08/2021 CLINICAL DATA:  Right-sided facial droop, weakness, slurred speech, stroke suspected EXAM: MRI HEAD WITHOUT CONTRAST MRA HEAD WITHOUT CONTRAST TECHNIQUE: Multiplanar, multi-echo pulse sequences of the brain and surrounding structures were acquired without intravenous contrast. Angiographic images of the Circle of Willis were acquired using MRA technique without intravenous contrast. COMPARISON:  MRI head 07/08/2019, MRA head 07/13/2017, correlation is also made with CT head 12/08/2021 FINDINGS: MRI HEAD FINDINGS Brain: No restricted diffusion to suggest acute or subacute infarct. No acute hemorrhage, mass, mass effect, or midline shift. No hydrocephalus or extra-axial collection.  Redemonstrated sequela of remote left MCA infarct involving the left basal ganglia, left temporal lobe, and left insula, with unchanged ex vacuo dilatation of the left lateral ventricle and associated wallerian degeneration in the left cerebral peduncle. Additional T2 hyperintensity in the pons is favored to be the sequela of chronic small vessel ischemic disease. Vascular: Normal arterial flow voids. Skull and upper cervical spine: Normal marrow signal. Sinuses/Orbits: No acute finding. Other: Trace fluid in the mastoid air cells. MRA HEAD FINDINGS Evaluation is somewhat limited by motion artifact. Anterior circulation: Both internal carotid arteries are patent to the termini, without significant stenosis. A1 segments patent. Normal anterior communicating artery. Anterior cerebral arteries are patent to their distal aspects, with apparent discontinuity in the mid ACAs likely related to motion. No M1 stenosis or occlusion. Normal MCA bifurcations. Distal MCA branches perfused and symmetric, with evaluation of the most distal aspects limited by motion. Previous suspected M2 stenosis may have been secondary to motion artifact on that study. Posterior circulation: Vertebral arteries patent to the vertebrobasilar junction without stenosis. Posterior inferior cerebral arteries patent bilaterally. Basilar patent to its distal aspect. Superior cerebellar arteries patent bilaterally. Patent right P1. The left P1 is not definitively visualized, with fetal or near fetal origin  of the left PCA from a patent left posterior communicating artery. The right posterior communicating artery is not visualized. PCAs perfused to their distal aspects without stenosis. Anatomic variants: Fetal or near fetal origin of the left PCA. IMPRESSION: 1. No acute intracranial process. No evidence of acute or subacute infarct. 2. Evaluation is mildly limited by motion artifact. Within that limitation, no intracranial large vessel occlusion or  significant stenosis. Electronically Signed   By: Merilyn Baba M.D.   On: 12/08/2021 23:19   DG CHEST PORT 1 VIEW  Result Date: 12/13/2021 CLINICAL DATA:  Shortness of breath. EXAM: PORTABLE CHEST 1 VIEW COMPARISON:  12/08/2021 FINDINGS: 0723 hours. Low IM film. The cardio pericardial silhouette is enlarged. Interval increase in bibasilar atelectasis. No substantial pleural effusion or pulmonary edema. The visualized bony structures of the thorax are unremarkable. Telemetry leads overlie the chest. IMPRESSION: Interval progression of bibasilar atelectasis on this low volume film. Electronically Signed   By: Misty Stanley M.D.   On: 12/13/2021 07:49   DG Chest Portable 1 View  Result Date: 12/08/2021 CLINICAL DATA:  Sepsis EXAM: PORTABLE CHEST 1 VIEW COMPARISON:  11/12/2021 FINDINGS: Transverse diameter of heart is in the upper limits of normal. There are no signs of pulmonary edema or focal pulmonary consolidation. There is interval clearing of small linear densities at the left cardiophrenic angle. There is no significant pleural effusion or pneumothorax. IMPRESSION: No active disease. Electronically Signed   By: Elmer Picker M.D.   On: 12/08/2021 18:09   DG Abd Portable 1V  Result Date: 12/09/2021 CLINICAL DATA:  Abdominal discomfort. EXAM: PORTABLE ABDOMEN - 1 VIEW COMPARISON:  No abnormal abdominal or pelvic calcifications. FINDINGS: Enteric contrast material opacifies nondilated loops of small bowel. The bowel gas pattern appears nonobstructed. Postop change is again noted within the lower lumbar spine. IMPRESSION: Nonobstructive bowel gas pattern. Electronically Signed   By: Kerby Moors M.D.   On: 12/09/2021 11:49   DG Swallowing Func-Speech Pathology  Result Date: 12/09/2021 Table formatting from the original result was not included. Objective Swallowing Evaluation: Type of Study: MBS-Modified Barium Swallow Study  Patient Details Name: LAMIAH MARMOL MRN: [id number] Date of  Birth: Jul 14, 1941 Today's Date: 12/09/2021 Time: SLP Start Time (ACUTE ONLY): 2130 -SLP Stop Time (ACUTE ONLY): 0930 SLP Time Calculation (min) (ACUTE ONLY): 16 min Past Medical History: Past Medical History: Diagnosis Date  Blood transfusion without reported diagnosis   Chronic low back pain   History of echocardiogram   Echo 5/18: EF 55-60, Gr 2 DD, mild MR, mild LAE, PASP 47 // Echo 1/18:  EF 55, mild AI, MAC, mild MR, mild LAE, trivial pericardial effusion  History of ischemic left MCA stroke 11/2016  left MCA infarct status post mechanical thrombectomy complicated by large left basal ganglia hemorrhage with right shift.  HTN (hypertension)   Hyperlipidemia   Hypothyroidism   Osteopenia   Persistent atrial fibrillation ([name])   CHADS2-VASc=6 (female, age 55, HTN, CVA) // Apixaban // rate control strategy   Sacral wound   chronic sacral wound Past Surgical History: Past Surgical History: Procedure Laterality Date  ABDOMINAL HYSTERECTOMY  1970  CHOLECYSTECTOMY  07/2009  Dr. Rise Patience  COLONOSCOPY  2003  [place]  2010  HAND SURGERY    IR ANGIO INTRA EXTRACRAN SEL COM CAROTID INNOMINATE UNI L MOD SED  11/29/2016  IR ANGIO VERTEBRAL SEL SUBCLAVIAN INNOMINATE UNI R MOD SED  11/29/2016  IR PERCUTANEOUS ART THROMBECTOMY/INFUSION INTRACRANIAL INC DIAG ANGIO  11/29/2016  IR RADIOLOGIST EVAL &  MGMT  03/24/2017  LUMBAR LAMINECTOMY  11/2008  Done by Dr. Patrice Paradise  RADIOLOGY WITH ANESTHESIA N/A 11/29/2016  Procedure: RADIOLOGY WITH ANESTHESIA;  Surgeon: Radiologist, Medication, MD;  Location: [place];  Service: Radiology;  Laterality: N/A;  THYROIDECTOMY   HPI: Per MD report "COPPER KIRTLEY is a 81 y.o. female with medical history significant for persistent atrial fibrillation chronically anticoagulated on Eliquis, chronic diastolic heart failure hypertension, hyperlipidemia, chronic sacral wound, acquired hypothyroidism, stage IV CKD with baseline creatinine 1.7-2.0, who is admitted to [facility] on 12/08/2021  with syncope, right facial droop and dysarthria.  MRI negative and CXR negative.  Pt did not pass [facility] swallow screen.  Evaluation ordered.  Pt with PMH + for left MCA CVA in 2018.  Subjective: pt awake in chair  Recommendations for follow up therapy are one component of a multi-disciplinary discharge planning process, led by the attending physician.  Recommendations may be updated based on patient status, additional functional criteria and insurance authorization. Assessment / Plan / Recommendation   12/09/2021   9:55 AM Clinical Impressions Clinical Impression Pt presents with mild oropharyngeal dysphagia c/b discoordination with oral transiting due to lingual weakness.  Pharyngeal swallow initiation with liquids was to pyriform sinus contributing to inconsistent laryngeal penetration of thin liquid *and ultra thin barium.  Pt did not cough during MBS as observed during clinical swallow evaluation.  Pharyngeal swallow is strong without retention fortunately.  She did not orally transit barium tablet with thin liquids despite 3 attempts - resulting in premature spillage of liquid into pharynx and penetration.  Pt required applesauce to transit masticated tablet.  Use of straw placed on left side improved oral transiting control.      Chin tuck not tested as not indicated and pt admits to difficulty with head ROM.  Upon esophageal sweep, pt appears with retention of tablet particles - recommend strict esophageal precautions including sitting upright after intake.  Suspect pt has an acute on chronic dysphagia per her report of occasional cough with po and reflux.  Using teach back with live video, educated pt to recommendations/precautions.  Will follow up x1 due to pt's dysphagia for pt/family education and po tolerance. SLP Visit Diagnosis Dysphagia, oral phase (R13.11) Impact on safety and function Mild aspiration risk     12/09/2021   9:55 AM Treatment Recommendations Treatment Recommendations Therapy as outlined  in treatment plan below     12/09/2021   9:56 AM Prognosis Prognosis for Safe Diet Advancement Good   12/09/2021   9:55 AM Diet Recommendations SLP Diet Recommendations Dysphagia 3 (Mech soft) solids;Thin liquid Liquid Administration via Cup;Straw Medication Administration Whole meds with puree Compensations Slow rate;Small sips/bites Postural Changes Remain semi-upright after after feeds/meals (Comment);Seated upright at 90 degrees     12/09/2021   9:55 AM Other Recommendations Oral Care Recommendations Oral care BID Follow Up Recommendations No SLP follow up Assistance recommended at discharge Intermittent Supervision/Assistance Functional Status Assessment Patient has had a recent decline in their functional status and demonstrates the ability to make significant improvements in function in a reasonable and predictable amount of time.   12/09/2021   9:55 AM Frequency and Duration  Speech Therapy Frequency (ACUTE ONLY) min 1 x/week Treatment Duration 1 week     12/09/2021   9:52 AM Oral Phase Oral Phase Impaired Oral - Nectar Cup Decreased bolus cohesion;Premature spillage Oral - Thin Cup Decreased bolus cohesion;Premature spillage Oral - Thin Straw Decreased bolus cohesion;Premature spillage Oral - Puree WFL Oral -  Mech Soft WFL Oral - Pill Decreased bolus cohesion;Delayed oral transit Oral Phase - Comment pt masticated tablet after being unable to transit with liquids x3 attempts, used puree to orally transit    12/09/2021   9:54 AM Pharyngeal Phase Pharyngeal Phase Impaired Pharyngeal- Nectar Cup Delayed swallow initiation-pyriform sinuses Pharyngeal Material does not enter airway Pharyngeal- Thin Cup Delayed swallow initiation-pyriform sinuses;Penetration/Aspiration during swallow;Pharyngeal residue - pyriform Pharyngeal Material enters airway, remains ABOVE vocal cords and not ejected out Pharyngeal- Thin Straw Delayed swallow initiation-pyriform sinuses;Pharyngeal residue - pyriform Pharyngeal Material enters  airway, remains ABOVE vocal cords and not ejected out;Material enters airway, remains ABOVE vocal cords then ejected out Pharyngeal- Puree WFL;Delayed swallow initiation-vallecula Pharyngeal Material does not enter airway Pharyngeal- Mechanical Soft Delayed swallow initiation-vallecula;WFL Pharyngeal Material does not enter airway Pharyngeal- Pill WFL;Delayed swallow initiation-vallecula Pharyngeal Material does not enter airway    12/09/2021   9:55 AM Cervical Esophageal Phase  Cervical Esophageal Phase WFL Kathleen Lime, MS [facility] SLP Acute Rehab Services Office [phone] Pager [phone] Macario Golds 12/09/2021, 10:01 AM                     VAS [place] ABI WITH/WO TBI  Result Date: 12/12/2021  LOWER EXTREMITY DOPPLER STUDY Patient Name:  PASCALE MAVES  Date of Exam:   12/12/2021 Medical Rec #: [id number]            Accession #:    [id number] Date of Birth: March 28, 1941            Patient Gender: F Patient Age:   24 years Exam Location:  [facility] Procedure:      VAS [place] ABI WITH/WO TBI Referring Phys: PRANAV PATEL --------------------------------------------------------------------------------  Indications: Peripheral artery disease. High Risk Factors: Hyperlipidemia, Diabetes.  Comparison Study: No prior studies. Performing Technologist: Carlos Levering RVT  Examination Guidelines: A complete evaluation includes at minimum, Doppler waveform signals and systolic blood pressure reading at the level of bilateral brachial, anterior tibial, and posterior tibial arteries, when vessel segments are accessible. Bilateral testing is considered an integral part of a complete examination. Photoelectric Plethysmograph (PPG) waveforms and toe systolic pressure readings are included as required and additional duplex testing as needed. Limited examinations for reoccurring indications may be performed as noted.  ABI Findings: +---------+------------------+-----+---------+--------+ Right    Rt Pressure  (mmHg)IndexWaveform Comment  +---------+------------------+-----+---------+--------+ Brachial 162                    triphasic         +---------+------------------+-----+---------+--------+ PTA      185               1.14 biphasic          +---------+------------------+-----+---------+--------+ DP       182               1.12 biphasic          +---------+------------------+-----+---------+--------+ Great Toe147               0.91                   +---------+------------------+-----+---------+--------+ +---------+------------------+-----+---------+-------+ Left     Lt Pressure (mmHg)IndexWaveform Comment +---------+------------------+-----+---------+-------+ Brachial 145                    triphasic        +---------+------------------+-----+---------+-------+ PTA      174  1.07 biphasic         +---------+------------------+-----+---------+-------+ DP       189               1.17 biphasic         +---------+------------------+-----+---------+-------+ Great Toe119               0.73                  +---------+------------------+-----+---------+-------+ +-------+-----------+-----------+------------+------------+ ABI/TBIToday's ABIToday's TBIPrevious ABIPrevious TBI +-------+-----------+-----------+------------+------------+ Right  1.14       0.91                                +-------+-----------+-----------+------------+------------+ Left   1.17       0.73                                +-------+-----------+-----------+------------+------------+  Summary: Right: Resting right ankle-brachial index is within normal range. No evidence of significant right lower extremity arterial disease. The right toe-brachial index is normal. Left: Resting left ankle-brachial index is within normal range. No evidence of significant left lower extremity arterial disease. The left toe-brachial index is normal. *See table(s) above for  measurements and observations.  Electronically signed by Servando Snare MD on 12/12/2021 at 7:02:41 PM.    Final    ECHOCARDIOGRAM COMPLETE  Result Date: 12/09/2021    ECHOCARDIOGRAM REPORT   Patient Name:   SHARNAY CASHION Date of Exam: 12/09/2021 Medical Rec #:  [id number]           Height:       66.0 in Accession #:    [id number]          Weight:       167.8 lb Date of Birth:  [birth year]           BSA:          1.856 m Patient Age:    83 years            BP:           116/64 mmHg Patient Gender: F                   HR:           81 bpm. Exam Location:  Inpatient Procedure: 2D Echo, Cardiac Doppler and Color Doppler Indications:    Syncope  History:        Patient has prior history of Echocardiogram examinations, most                 recent 12/02/2016. Stroke, Arrythmias:Atrial Fibrillation; Risk                 Factors:Hypertension and Dyslipidemia.  Sonographer:    Spickard Referring Phys: [id number] [place]  1. Left ventricular ejection fraction, by estimation, is 65 to 70%. The left ventricle has normal function. The left ventricle has no regional wall motion abnormalities. Left ventricular diastolic parameters are indeterminate.  2. Right ventricular systolic function is normal. The right ventricular size is mildly enlarged. There is mildly elevated pulmonary artery systolic pressure. The estimated right ventricular systolic pressure is 22.0 mmHg.  3. Left atrial size was mildly dilated.  4. The mitral valve is grossly normal. No evidence of mitral valve regurgitation.  5. The aortic valve is tricuspid. There is mild thickening  of the aortic valve. Aortic valve regurgitation is moderate. No aortic stenosis is present. Aortic regurgitation PHT measures 460 msec.  6. Aortic dilatation noted. There is borderline dilatation of the ascending aorta, measuring 39 mm. Comparison(s): AI has increased from prior report; unable to open 2018 study. FINDINGS  Left Ventricle: Left ventricular  ejection fraction, by estimation, is 65 to 70%. The left ventricle has normal function. The left ventricle has no regional wall motion abnormalities. The left ventricular internal cavity size was normal in size. There is  no left ventricular hypertrophy. Left ventricular diastolic parameters are indeterminate. Right Ventricle: The right ventricular size is mildly enlarged. No increase in right ventricular wall thickness. Right ventricular systolic function is normal. There is mildly elevated pulmonary artery systolic pressure. The tricuspid regurgitant velocity is 2.99 m/s, and with an assumed right atrial pressure of 3 mmHg, the estimated right ventricular systolic pressure is 16.1 mmHg. Left Atrium: Left atrial size was mildly dilated. Right Atrium: Right atrial size was normal in size. Pericardium: There is no evidence of pericardial effusion. Mitral Valve: The mitral valve is grossly normal. No evidence of mitral valve regurgitation. Tricuspid Valve: The tricuspid valve is normal in structure. Tricuspid valve regurgitation is mild . No evidence of tricuspid stenosis. Aortic Valve: The aortic valve is tricuspid. There is mild thickening of the aortic valve. Aortic valve regurgitation is moderate. Aortic regurgitation PHT measures 460 msec. No aortic stenosis is present. Aortic valve mean gradient measures 5.5 mmHg. Aortic valve peak gradient measures 12.0 mmHg. Aortic valve area, by VTI measures 1.66 cm. Pulmonic Valve: The pulmonic valve was normal in structure. Pulmonic valve regurgitation is trivial. No evidence of pulmonic stenosis. Aorta: Aortic dilatation noted. There is borderline dilatation of the ascending aorta, measuring 39 mm. IAS/Shunts: No atrial level shunt detected by color flow Doppler.  LEFT VENTRICLE PLAX 2D LVIDd:         4.00 cm LVIDs:         2.70 cm LV PW:         1.10 cm LV IVS:        1.00 cm LVOT diam:     1.70 cm LV SV:         55 LV SV Index:   30 LVOT Area:     2.27 cm  LV Volumes  (MOD) LV vol d, MOD A2C: 60.2 ml LV vol d, MOD A4C: 59.4 ml LV vol s, MOD A2C: 10.6 ml LV vol s, MOD A4C: 23.5 ml LV SV MOD A2C:     49.6 ml LV SV MOD A4C:     59.4 ml LV SV MOD BP:      42.8 ml RIGHT VENTRICLE RV Basal diam:  4.10 cm RV Mid diam:    2.70 cm RV S prime:     14.70 cm/s TAPSE (M-mode): 2.8 cm LEFT ATRIUM             Index        RIGHT ATRIUM           Index LA diam:        2.80 cm 1.51 cm/m   RA Area:     16.10 cm LA Vol (A2C):   69.0 ml 37.18 ml/m  RA Volume:   36.40 ml  19.61 ml/m LA Vol (A4C):   74.2 ml 39.98 ml/m LA Biplane Vol: 77.8 ml 41.92 ml/m  AORTIC VALVE  PULMONIC VALVE AV Area (Vmax):    1.57 cm      PV Vmax:          1.16 m/s AV Area (Vmean):   1.68 cm      PV Peak grad:     5.4 mmHg AV Area (VTI):     1.66 cm      PR End Diast Vel: 5.86 msec AV Vmax:           173.00 cm/s AV Vmean:          106.500 cm/s AV VTI:            0.333 m AV Peak Grad:      12.0 mmHg AV Mean Grad:      5.5 mmHg LVOT Vmax:         120.00 cm/s LVOT Vmean:        79.000 cm/s LVOT VTI:          0.244 m LVOT/AV VTI ratio: 0.73 AI PHT:            460 msec  AORTA Ao Root diam: 3.10 cm Ao Asc diam:  3.80 cm MR Peak grad: 59.9 mmHg     TRICUSPID VALVE MR Vmax:      387.00 cm/s   TR Peak grad:   35.8 mmHg MV E velocity: 102.00 cm/s  TR Vmax:        299.00 cm/s                              SHUNTS                             Systemic VTI:  0.24 m                             Systemic Diam: 1.70 cm Rudean Haskell MD Electronically signed by Rudean Haskell MD Signature Date/Time: 12/09/2021/1:26:47 PM    Final    CT HEAD CODE STROKE WO CONTRAST  Result Date: 12/08/2021 CLINICAL DATA:  Code stroke. Neuro deficit, acute, stroke suspected. Right-sided facial droop, weakness, slurred speech. EXAM: CT HEAD WITHOUT CONTRAST TECHNIQUE: Contiguous axial images were obtained from the base of the skull through the vertex without intravenous contrast. RADIATION DOSE REDUCTION: This exam was  performed according to the departmental dose-optimization program which includes automated exposure control, adjustment of the mA and/or kV according to patient size and/or use of iterative reconstruction technique. COMPARISON:  Brain MRI 07/08/2019.  Head CT 07/12/2017. FINDINGS: Brain: No age advanced or lobar predominant parenchymal atrophy. Redemonstrated chronic left MCA territory infarct within the left temporal lobe, left insula and left basal ganglia. Chronic small vessel ischemic changes within the cerebral white matter, better appreciated on the prior brain MRI of 07/08/2019. There is no acute intracranial hemorrhage. No acute demarcated cortical infarct. No extra-axial fluid collection. No evidence of an intracranial mass. No midline shift. Vascular: No hyperdense vessel.  Atherosclerotic calcifications. Skull: Normal. Negative for fracture or focal lesion. Sinuses/Orbits: No mass or acute finding within the imaged orbits. Minimal frothy secretions within the left maxillary sinus at the imaged levels. ASPECTS [facility] Stroke Program Early CT Score) - Ganglionic level infarction (caudate, lentiform nuclei, internal capsule, insula, M1-M3 cortex): 7 - Supraganglionic infarction (M4-M6 cortex): 3 Total score (0-10 with 10 being normal): 10 (when discounting chronic infarcts). No acute  intracranial findings. These results were communicated to Dr. Theda Sers at 4:40 pmon 5/22/2023by text page via the [facility] messaging system. IMPRESSION: No evidence of acute intracranial abnormality. Redemonstrated chronic left MCA territory infarct within the left temporal lobe, insula and basal ganglia. Chronic small-vessel ischemic changes within the pons, better appreciated on the prior brain MRI of 07/08/2019. Mild left maxillary sinusitis. Electronically Signed   By: Kellie Simmering D.O.   On: 12/08/2021 16:42   VAS US CAROTID  Result Date: 12/09/2021 Carotid Arterial Duplex Study Patient Name:  LOANNE EMERY  Date of  Exam:   12/09/2021 Medical Rec #: [id number]            Accession #:    [id number] Date of Birth: 27-Apr-1941            Patient Gender: F Patient Age:   81 years Exam Location:  [facility] Procedure:      VAS US CAROTID Referring Phys: Babs Bertin --------------------------------------------------------------------------------  Indications:       TIA. Risk Factors:      Hypertension, hyperlipidemia, no history of smoking, prior                    CVA. Comparison Study:  11-29-2016 CTA Neck showed no evidence of stenosis of right                    carotid and 25% or less of left carotid. Performing Technologist: Darlin Coco RDMS, RVT  Examination Guidelines: A complete evaluation includes B-mode imaging, spectral Doppler, color Doppler, and power Doppler as needed of all accessible portions of each vessel. Bilateral testing is considered an integral part of a complete examination. Limited examinations for reoccurring indications may be performed as noted.  Right Carotid Findings: +----------+-------+-------+--------+------------------------+-----------------+           PSV    EDV    StenosisPlaque Description      Comments                    cm/s   cm/s                                                     +----------+-------+-------+--------+------------------------+-----------------+ CCA Prox  79     9                                                        +----------+-------+-------+--------+------------------------+-----------------+ CCA Distal59     7                                      intimal                                                                   thickening        +----------+-------+-------+--------+------------------------+-----------------+ ICA Prox  62  16     1-39%   focal, smooth and                                                         calcific                                   +----------+-------+-------+--------+------------------------+-----------------+ ICA Distal88     18                                                       +----------+-------+-------+--------+------------------------+-----------------+ ECA       109                                                             +----------+-------+-------+--------+------------------------+-----------------+ +----------+--------+-------+----------------+-------------------+           PSV cm/sEDV cmsDescribe        Arm Pressure (mmHG) +----------+--------+-------+----------------+-------------------+ [id number]            Multiphasic, WNL                    +----------+--------+-------+----------------+-------------------+ +---------+--------+--+--------+-+---------+ VertebralPSV cm/s62EDV cm/s7Antegrade +---------+--------+--+--------+-+---------+  Left Carotid Findings: +----------+--------+--------+--------+-------------------+--------+           PSV cm/sEDV cm/sStenosisPlaque Description Comments +----------+--------+--------+--------+-------------------+--------+ CCA Prox  71      13                                          +----------+--------+--------+--------+-------------------+--------+ CCA Distal64      10                                          +----------+--------+--------+--------+-------------------+--------+ ICA Prox  57      12      1-39%   smooth and calcific         +----------+--------+--------+--------+-------------------+--------+ ICA Distal64      17                                          +----------+--------+--------+--------+-------------------+--------+ ECA       99                                                  +----------+--------+--------+--------+-------------------+--------+ +----------+--------+--------+----------------+-------------------+           PSV cm/sEDV cm/sDescribe        Arm Pressure (mmHG)  +----------+--------+--------+----------------+-------------------+ Subclavian171             Multiphasic, WNL                    +----------+--------+--------+----------------+-------------------+ +---------+--------+--+--------+-+---------+  VertebralPSV cm/s67EDV cm/s8Antegrade +---------+--------+--+--------+-+---------+   Summary: Right Carotid: Velocities in the right ICA are consistent with a 1-39% stenosis. Left Carotid: Velocities in the left ICA are consistent with a 1-39% stenosis. Vertebrals:  Bilateral vertebral arteries demonstrate antegrade flow. Subclavians: Normal flow hemodynamics were seen in bilateral subclavian              arteries. *See table(s) above for measurements and observations.  Electronically signed by Servando Snare MD on 12/09/2021 at 5:29:32 PM.    Final    VAS [place] LOWER EXTREMITY VENOUS (DVT)  Result Date: 12/11/2021  Lower Venous DVT Study Patient Name:  SVARA TWYMAN  Date of Exam:   12/11/2021 Medical Rec #: [id number]            Accession #:    [id number] Date of Birth: Oct 15, 1940            Patient Gender: F Patient Age:   53 years Exam Location:  [facility] Procedure:      VAS [place] LOWER EXTREMITY VENOUS (DVT) Referring Phys: PRANAV PATEL --------------------------------------------------------------------------------  Indications: Edema.  Risk Factors: None identified. Limitations: Body habitus, poor ultrasound/tissue interface and patient positioning, patient immobility, patient pain tolerance, patient somnolence. Comparison Study: No prior studies. Performing Technologist: Oliver Hum RVT  Examination Guidelines: A complete evaluation includes B-mode imaging, spectral Doppler, color Doppler, and power Doppler as needed of all accessible portions of each vessel. Bilateral testing is considered an integral part of a complete examination. Limited examinations for reoccurring indications may be performed as noted. The reflux portion of the exam is  performed with the patient in reverse Trendelenburg.  +---------+---------------+---------+-----------+----------+-------------------+ RIGHT    CompressibilityPhasicitySpontaneityPropertiesThrombus Aging      +---------+---------------+---------+-----------+----------+-------------------+ CFV      Full           Yes      Yes                                      +---------+---------------+---------+-----------+----------+-------------------+ SFJ      Full                                                             +---------+---------------+---------+-----------+----------+-------------------+ FV Prox  Full                                                             +---------+---------------+---------+-----------+----------+-------------------+ FV Mid   Full                                                             +---------+---------------+---------+-----------+----------+-------------------+ FV Distal               Yes      Yes                                      +---------+---------------+---------+-----------+----------+-------------------+  PFV      Full                                                             +---------+---------------+---------+-----------+----------+-------------------+ POP      Full           Yes      Yes                                      +---------+---------------+---------+-----------+----------+-------------------+ PTV      Full                                                             +---------+---------------+---------+-----------+----------+-------------------+ PERO                                                  Patency shown with                                                        color doppler       +---------+---------------+---------+-----------+----------+-------------------+   +---------+---------------+---------+-----------+----------+--------------+ LEFT      CompressibilityPhasicitySpontaneityPropertiesThrombus Aging +---------+---------------+---------+-----------+----------+--------------+ CFV      Full           Yes      Yes                                 +---------+---------------+---------+-----------+----------+--------------+ SFJ      Full                                                        +---------+---------------+---------+-----------+----------+--------------+ FV Prox  Full                                                        +---------+---------------+---------+-----------+----------+--------------+ FV Mid   Full                                                        +---------+---------------+---------+-----------+----------+--------------+ FV Distal               Yes      Yes                                 +---------+---------------+---------+-----------+----------+--------------+  PFV      Full                                                        +---------+---------------+---------+-----------+----------+--------------+ POP      Full           Yes      Yes                                 +---------+---------------+---------+-----------+----------+--------------+ PTV      Full                                                        +---------+---------------+---------+-----------+----------+--------------+ PERO     Full                                                        +---------+---------------+---------+-----------+----------+--------------+     Summary: RIGHT: - There is no evidence of deep vein thrombosis in the lower extremity. However, portions of this examination were limited- see technologist comments above.  - No cystic structure found in the popliteal fossa.  LEFT: - There is no evidence of deep vein thrombosis in the lower extremity. However, portions of this examination were limited- see technologist comments above.  - No cystic structure found in the popliteal  fossa.  *See table(s) above for measurements and observations. Electronically signed by Jamelle Haring on 12/11/2021 at 4:52:09 PM.    Final     Microbiology: Results for orders placed or performed during the hospital encounter of 12/08/21  Culture, blood (single)     Status: None   Collection Time: 12/08/21  6:05 PM   Specimen: BLOOD  Result Value Ref Range Status   Specimen Description BLOOD RIGHT ANTECUBITAL  Final   Special Requests   Final    BOTTLES DRAWN AEROBIC AND ANAEROBIC Blood Culture results may not be optimal due to an inadequate volume of blood received in culture bottles   Culture   Final    NO GROWTH 5 DAYS Performed at [facility], [place] [address] [place], [place] [address]    Report Status 12/13/2021 FINAL  Final    Labs: CBC: Recent Labs  Lab 12/08/21 1628 12/08/21 1629 12/09/21 0409 12/10/21 0252 12/11/21 0215 12/11/21 1951 12/12/21 0452  WBC 10.6*  --  11.3* 9.1 7.4  --  6.7  NEUTROABS 7.9*  --  8.7*  --   --   --   --   HGB 11.4*   < > 10.1* 9.2* 7.7* 8.9* 9.3*  HCT 35.8*   < > 32.2* 28.8* 24.2* 27.9* 28.8*  MCV 99.4  --  98.5 97.3 98.8  --  97.0  PLT 204  --  166 156 146*  --  140*   < > = values in this interval not displayed.   Basic Metabolic Panel: Recent Labs  Lab 12/09/21 0409 12/10/21 0252 12/11/21 0215 12/12/21 0452 12/13/21  3664 12/14/21 0233  NA 139 139 140 140 140 139  K 4.2 4.5 3.9 4.0 3.9 3.7  CL 114* 116* 118* 116* 113* 112*  CO2 15* 16* 16* 17* 20* 22  GLUCOSE 92 96 141* 95 123* 119*  BUN 55* 40* 26* _0 CREATININE 2.16* 1.93* 1.58* 1.38* 1.22* 1.39*  CALCIUM 8.3* 8.3* 8.2* 8.4* 8.6* 8.3*  MG 1.7 1.7 1.7 1.7  --   --    Liver Function Tests: Recent Labs  Lab 12/08/21 1628 12/09/21 0409 12/10/21 0252 12/11/21 0215 12/12/21 0452  AST _1 ALT _2 ALKPHOS 79 67 67 56 59  BILITOT 0.4 0.5 0.8 0.2* 0.5  PROT 5.8* 5.0* 4.9* 4.7* 5.0*  ALBUMIN 2.6* 2.2* 2.1* 2.4* 2.4*   CBG: Recent  Labs  Lab 12/08/21 1622  GLUCAP 119*    Discharge time spent: {LESS THAN/GREATER THAN:26388} 30 minutes.  Signed: Patrecia Pour, MD [facility] 12/14/2021

## 2021-12-14 NOTE — Plan of Care (Signed)
Pt is currently resting with eyes closed. Pt has been turned and repositioned. Pt was SOB and had audible wheezing. O'@96'$ % RA. Pt placed on 2L nasal cannula for SOB. Pt was agitated called to room by daughter x 2 pt trying to get out of bed. Pt redirected. Pt restless. Pt given xanax for anxiety. Pt also c/o constipation, suppository given. Pt had small bowel movement, hard balls of poop. Pt felt better after suppository and small Bowel movement. Pt SOB has improved. Pt resting.    Problem: Education: Goal: Knowledge of General Education information will improve Description: Including pain rating scale, medication(s)/side effects and non-pharmacologic comfort measures 12/14/2021 0056 by Allayne Stack, RN Outcome: Progressing 12/14/2021 0033 by Allayne Stack, RN Outcome: Progressing   Problem: Health Behavior/Discharge Planning: Goal: Ability to manage health-related needs will improve 12/14/2021 0056 by Allayne Stack, RN Outcome: Progressing 12/14/2021 0033 by Allayne Stack, RN Outcome: Progressing   Problem: Clinical Measurements: Goal: Ability to maintain clinical measurements within normal limits will improve 12/14/2021 0056 by Allayne Stack, RN Outcome: Progressing 12/14/2021 0033 by Allayne Stack, RN Outcome: Progressing Goal: Will remain free from infection 12/14/2021 0056 by Allayne Stack, RN Outcome: Progressing 12/14/2021 0033 by Allayne Stack, RN Outcome: Progressing Goal: Diagnostic test results will improve 12/14/2021 0056 by Allayne Stack, RN Outcome: Progressing 12/14/2021 0033 by Allayne Stack, RN Outcome: Progressing Goal: Respiratory complications will improve 12/14/2021 0056 by Allayne Stack, RN Outcome: Progressing 12/14/2021 0033 by Allayne Stack, RN Outcome: Progressing Goal: Cardiovascular complication will be avoided 12/14/2021 0056 by Allayne Stack, RN Outcome: Progressing 12/14/2021 0033 by Allayne Stack, RN Outcome: Progressing   Problem: Activity: Goal: Risk for activity intolerance will decrease 12/14/2021 0056 by Allayne Stack, RN Outcome: Progressing 12/14/2021 0033 by Allayne Stack, RN Outcome: Progressing   Problem: Nutrition: Goal: Adequate nutrition will be maintained 12/14/2021 0056 by Allayne Stack, RN Outcome: Progressing 12/14/2021 0033 by Allayne Stack, RN Outcome: Progressing   Problem: Coping: Goal: Level of anxiety will decrease 12/14/2021 0056 by Allayne Stack, RN Outcome: Progressing 12/14/2021 0033 by Allayne Stack, RN Outcome: Progressing   Problem: Elimination: Goal: Will not experience complications related to bowel motility 12/14/2021 0056 by Allayne Stack, RN Outcome: Progressing 12/14/2021 0033 by Allayne Stack, RN Outcome: Progressing Goal: Will not experience complications related to urinary retention 12/14/2021 0056 by Allayne Stack, RN Outcome: Progressing 12/14/2021 0033 by Allayne Stack, RN Outcome: Progressing   Problem: Pain Managment: Goal: General experience of comfort will improve 12/14/2021 0056 by Allayne Stack, RN Outcome: Progressing 12/14/2021 0033 by Allayne Stack, RN Outcome: Progressing   Problem: Safety: Goal: Ability to remain free from injury will improve 12/14/2021 0056 by Allayne Stack, RN Outcome: Progressing 12/14/2021 0033 by Allayne Stack, RN Outcome: Progressing   Problem: Skin Integrity: Goal: Risk for impaired skin integrity will decrease 12/14/2021 0056 by Allayne Stack, RN Outcome: Progressing 12/14/2021 0033 by Allayne Stack, RN Outcome: Progressing   Problem: Education: Goal: Knowledge of disease or condition will improve 12/14/2021 0056 by Allayne Stack, RN Outcome: Progressing 12/14/2021 0033 by Allayne Stack, RN Outcome: Progressing Goal: Knowledge of secondary prevention will improve (SELECT ALL) 12/14/2021  0056 by Allayne Stack, RN Outcome: Progressing 12/14/2021 0033 by Allayne Stack, RN Outcome: Progressing Goal: Knowledge of patient specific risk factors will improve (INDIVIDUALIZE FOR PATIENT) 12/14/2021 0056 by Chauncey Mann  R, RN Outcome: Progressing 12/14/2021 0033 by Allayne Stack, RN Outcome: Progressing

## 2021-12-14 NOTE — TOC Transition Note (Signed)
Transition of Care [facility]) - CM/SW Discharge Note   Patient Details  Name: MONZERRATH MCBURNEY MRN: [id number] Date of Birth: November 02, 1940  Transition of Care [facility]) CM/SW Contact:  Carles Collet, RN Phone Number: 12/14/2021, 1:01 PM   Clinical Narrative:    Thomas Hoff MD, patient to work w PT again prior to DC. Anticipate DC to home w [facility] services, set up with [facility], the have been notified of DC. DME has been delivered to the room.     Final next level of care: Home w Home Health Services Barriers to Discharge: No Barriers Identified   Patient Goals and CMS Choice   CMS Medicare.gov Compare Post Acute Care list provided to:: Patient Represenative (must comment) Choice offered to / list presented to : Adult Children  Discharge Placement                       Discharge Plan and Services   Discharge Planning Services: CM Consult Post Acute Care Choice: Home Health          DME Arranged: 3-N-1, Walker rolling DME Agency: [employer] Date DME Agency Contacted: 12/12/21     HH Arranged: PT, OT HH Agency: [facility] Date HH Agency Contacted: 12/14/21 Time HH Agency Contacted: 1301 Representative spoke with at [place]: Cindie  Social Determinants of Health ([place]) Interventions     Readmission Risk Interventions     View : No data to display.

## 2021-12-14 NOTE — Plan of Care (Signed)
  Problem: Education: Goal: Knowledge of General Education information will improve Description: Including pain rating scale, medication(s)/side effects and non-pharmacologic comfort measures Outcome: Completed/Met   Problem: Health Behavior/Discharge Planning: Goal: Ability to manage health-related needs will improve Outcome: Completed/Met   Problem: Clinical Measurements: Goal: Ability to maintain clinical measurements within normal limits will improve Outcome: Completed/Met Goal: Will remain free from infection Outcome: Completed/Met Goal: Diagnostic test results will improve Outcome: Completed/Met Goal: Respiratory complications will improve Outcome: Completed/Met Goal: Cardiovascular complication will be avoided Outcome: Completed/Met   Problem: Activity: Goal: Risk for activity intolerance will decrease Outcome: Completed/Met   Problem: Nutrition: Goal: Adequate nutrition will be maintained Outcome: Completed/Met   Problem: Coping: Goal: Level of anxiety will decrease Outcome: Completed/Met   Problem: Elimination: Goal: Will not experience complications related to bowel motility Outcome: Completed/Met Goal: Will not experience complications related to urinary retention Outcome: Completed/Met   Problem: Pain Managment: Goal: General experience of comfort will improve Outcome: Completed/Met   Problem: Safety: Goal: Ability to remain free from injury will improve Outcome: Completed/Met   Problem: Skin Integrity: Goal: Risk for impaired skin integrity will decrease Outcome: Completed/Met   Problem: Education: Goal: Knowledge of disease or condition will improve Outcome: Completed/Met Goal: Knowledge of secondary prevention will improve (SELECT ALL) Outcome: Completed/Met Goal: Knowledge of patient specific risk factors will improve (INDIVIDUALIZE FOR PATIENT) Outcome: Completed/Met

## 2021-12-14 NOTE — Progress Notes (Signed)
52 Called received by RN from [employer] indicating that patient was in Sinus Rhythm.

## 2021-12-14 NOTE — Progress Notes (Signed)
Progress Note  Patient: Valerie Mcclure IEP:[id number] DOB: [birth year]  DOA: 12/08/2021  DOS: 12/14/2021    Brief hospital course: REGGIE WELGE is an 81 y.o. female with a history of persistent AFib on eliquis, chronic HFpEF, HTN, HLD, hypothyroidism, CKD 4 presented to the ED 5/22 after being found unresponsive in her bathroom with a heart rate of 30 and her right arm was contractured. ECG in ED showed rate-controlled AFib, labs showed AKI hypoalbuminemia, anemia. Chest x-ray no active disease, CT head code stroke no acute finding, redemonstration of chronic left MCA territory infarct within the left temporal lobe insula and basal ganglia chronic small vessel changes. Neurology was consulted, patient was admitted for further management. MRI showed no acute stroke.   Metoprolol was held with improvement in bradycardic rate. IVF given with improvement in renal function. On 5/27 she developed RVR for which metoprolol was restarted with improvement.   Assessment and Plan: Syncope, found down: No acute stroke on imaging. Suspect dehydration w/AKI caused weakness. No localizing symptoms or signs of infection found.  - Orthostatic vital signs are negative.  Persistent atrial flutter / atrial fibrillation with RVR: Provoked in setting of holding beta blocker on admission due to concern for bradycardic rate and syncope.  - Continue eliquis - Restarted metoprolol with improvement. Rates normalized. Will return to home dosing.  AKI on stage IV CKD: Improving. NAGMA also improving with fluids.  - Avoid nephrotoxins.  - Will plan to restart home torsemide in AM if her oral intake remains adequate.  Chronic HFpEF, HTN:  - Hold lisinopril, hydralazine for now.Continue BB as above  Hypoxia: Seems to have resolved. No wheezing on exam currently.  - Incentive spirometry.   - Get OOB as often as possible.  Demand myocardial ischemia: In setting of RVR. Mildly elevated Tn without chest pain.  -  Trend troponin while controlling rate. Pt on anticoagulation, beta blocker and statin.  Stroke-like symptom: Has had repeatedly negative neuroimaging  Code stroke work up done with CT head in ED. MRI brain/MR angio head-no evidence of acute or subacute stroke, mildly limited by motion.Echo EF 65 to 70%, indeterminate diastolic parameters, normal RV function, mitral aortic valve grossly normal moderate AR.Carotid duplex unremarkable. Repeat CT 5/27 without acute findings.   Acute metabolic encephalopathy suspect delirium Baseline memory deficit/cognitive impairment: Patient with delirium, so far infectious etiology unremarkable repeat UA-negative leukocytes and nitrite. MRI brain no acute finding.  Mostly hypotensive orthostatic and component of dehydration and AKI.  Mental status significantly improved but continues to wax and wane.  - Delirium precautions.  - Family remaining at bed.  - Minimize sedatives.   - Ideally would abbreviate hospitalization and return to familiar environment. Hoped her clinical improvement today would also show functional improvement but PT states she's increased to mod-max assist and limited to 20 feet ambulation. Will continue PT OT and ambulation. If not improved in AM, would have to pursue SNF rehabilitation.    SIRS: Sepsis ruled out. blood culture ordered sent so far negative, UA  cxr 5/22- no e/o infection.  Remains afebrile leukocytosis initially mild and now resolved, patient on ceftriaxone..  Family endorses similar presentation when patient has UTI repeat UA is unremarkable.  Discontinue antibiotics.   Aortic regurgitation: Moderate by echo.  - Cardiology follow up.  Hypothyroidism: TSH mildly elevated at 6.8, free T4 wnl. Clinically not hypothyroid.  - Continue synthroid.  Lower extremity edema: chronic. No DVT on U/S. ABIs were also checked and normal.   Depression:  Quiescent   HLD: Statin  AOCD: Stable.   DNR  Subjective: Rate better  controlled. She had "audible wheezing" last night which resolved with xanax. IV fluids were stopped but she's eaten a good breakfast and lunch today. Also had small hard BMs with suppository last night. No abd pain.  Objective: Vitals:   12/14/21 0348 12/14/21 0747 12/14/21 1012 12/14/21 1109  BP: (!) 113/56 97/67 (!) 115/42 (!) 93/44  Pulse: 80 71 68 70  Resp: '20 14  16  '$ Temp: 98.2 F (36.8 C) 97.8 F (36.6 C)  97.9 F (36.6 C)  TempSrc: Oral Oral  Oral  SpO2: 100% 100%  95%  Weight:      Height:       Gen: Elderly frail, pleasant female in no distress Pulm: Nonlabored breathing room air, no wheezes or crackles CV: Irreg with rate in 80's without murmur, rub, or gallop. No JVD, stable dependent edema. GI: Abdomen soft, non-tender, non-distended, with normoactive bowel sounds.  Ext: Warm, no deformities Skin: No new rashes, lesions or ulcers on visualized skin. Neuro: Alert and oriented. Stable dysarthria without any new focal neurological deficits. Psych: Judgement and insight appear fair. Mood euthymic & affect congruent. Behavior is appropriate.    Data Personally reviewed: CBC: Recent Labs  Lab 12/08/21 1628 12/08/21 1629 12/09/21 0409 12/10/21 0252 12/11/21 0215 12/11/21 1951 12/12/21 0452  WBC 10.6*  --  11.3* 9.1 7.4  --  6.7  NEUTROABS 7.9*  --  8.7*  --   --   --   --   HGB 11.4*   < > 10.1* 9.2* 7.7* 8.9* 9.3*  HCT 35.8*   < > 32.2* 28.8* 24.2* 27.9* 28.8*  MCV 99.4  --  98.5 97.3 98.8  --  97.0  PLT 204  --  166 156 146*  --  140*   < > = values in this interval not displayed.   Basic Metabolic Panel: Recent Labs  Lab 12/09/21 0409 12/10/21 0252 12/11/21 0215 12/12/21 0452 12/13/21 0333 12/14/21 0233  NA 139 139 140 140 140 139  K 4.2 4.5 3.9 4.0 3.9 3.7  CL 114* 116* 118* 116* 113* 112*  CO2 15* 16* 16* 17* 20* 22  GLUCOSE 92 96 141* 95 123* 119*  BUN 55* 40* 26* '15 11 11  '$ CREATININE 2.16* 1.93* 1.58* 1.38* 1.22* 1.39*  CALCIUM 8.3* 8.3* 8.2*  8.4* 8.6* 8.3*  MG 1.7 1.7 1.7 1.7  --   --    GFR: Estimated Creatinine Clearance: 32.5 mL/min (A) (by C-G formula based on SCr of 1.39 mg/dL (H)). Liver Function Tests: Recent Labs  Lab 12/08/21 1628 12/09/21 0409 12/10/21 0252 12/11/21 0215 12/12/21 0452  AST '25 24 26 24 23  '$ ALT '26 24 23 21 21  '$ ALKPHOS 79 67 67 56 59  BILITOT 0.4 0.5 0.8 0.2* 0.5  PROT 5.8* 5.0* 4.9* 4.7* 5.0*  ALBUMIN 2.6* 2.2* 2.1* 2.4* 2.4*   No results for input(s): LIPASE, AMYLASE in the last 168 hours. No results for input(s): AMMONIA in the last 168 hours. Coagulation Profile: Recent Labs  Lab 12/08/21 1628  INR 1.4*   Cardiac Enzymes: Recent Labs  Lab 12/09/21 0409  CKTOTAL 55   BNP (last 3 results) No results for input(s): PROBNP in the last 8760 hours. HbA1C: No results for input(s): HGBA1C in the last 72 hours. CBG: Recent Labs  Lab 12/08/21 1622  GLUCAP 119*   Lipid Profile: No results for input(s): CHOL, HDL, LDLCALC, TRIG,  CHOLHDL, LDLDIRECT in the last 72 hours. Thyroid Function Tests: No results for input(s): TSH, T4TOTAL, FREET4, T3FREE, THYROIDAB in the last 72 hours. Anemia Panel: No results for input(s): VITAMINB12, FOLATE, FERRITIN, TIBC, IRON, RETICCTPCT in the last 72 hours. Urine analysis:    Component Value Date/Time   COLORURINE YELLOW 12/11/2021 1450   APPEARANCEUR CLEAR 12/11/2021 1450   LABSPEC 1.014 12/11/2021 1450   PHURINE 5.0 12/11/2021 1450   GLUCOSEU NEGATIVE 12/11/2021 1450   GLUCOSEU NEGATIVE 11/18/2010 0859   HGBUR NEGATIVE 12/11/2021 1450   HGBUR negative 12/13/2008 0944   BILIRUBINUR NEGATIVE 12/11/2021 1450   KETONESUR NEGATIVE 12/11/2021 1450   PROTEINUR NEGATIVE 12/11/2021 1450   UROBILINOGEN 0.2 11/18/2010 0859   NITRITE NEGATIVE 12/11/2021 1450   LEUKOCYTESUR NEGATIVE 12/11/2021 1450   Recent Results (from the past 240 hour(s))  Culture, blood (single)     Status: None   Collection Time: 12/08/21  6:05 PM   Specimen: BLOOD  Result  Value Ref Range Status   Specimen Description BLOOD RIGHT ANTECUBITAL  Final   Special Requests   Final    BOTTLES DRAWN AEROBIC AND ANAEROBIC Blood Culture results may not be optimal due to an inadequate volume of blood received in culture bottles   Culture   Final    NO GROWTH 5 DAYS Performed at [facility], [place] [address] [place], [place] [address]    Report Status 12/13/2021 FINAL  Final     CT HEAD WO CONTRAST (5MM)  Result Date: 12/13/2021 CLINICAL DATA:  Mental status change of unknown etiology. EXAM: CT HEAD WITHOUT CONTRAST TECHNIQUE: Contiguous axial images were obtained from the base of the skull through the vertex without intravenous contrast. RADIATION DOSE REDUCTION: This exam was performed according to the departmental dose-optimization program which includes automated exposure control, adjustment of the mA and/or kV according to patient size and/or use of iterative reconstruction technique. COMPARISON:  MR brain 12/08/2021 FINDINGS: Brain: No evidence of acute infarction, hemorrhage, hydrocephalus, extra-axial collection or mass lesion/mass effect. Again noted are changes secondary to remote left MCA infarct involving the left basal ganglia, left temporal lobe, and left insula. Ex vacuo dilatation of the left lateral ventricle is noted and there is well layering in degeneration in the left cerebral peduncle. Prominence of the sulci and ventricles compatible with brain atrophy. Vascular: No hyperdense vessel or unexpected calcification. Skull: Normal. Negative for fracture or focal lesion. Sinuses/Orbits: No acute finding. Other: None IMPRESSION: 1. No acute intracranial abnormalities. 2. Chronic left MCA infarct. 3. Chronic small vessel ischemic disease and brain atrophy. Electronically Signed   By: Kerby Moors M.D.   On: 12/13/2021 17:53   DG CHEST PORT 1 VIEW  Result Date: 12/13/2021 CLINICAL DATA:  Shortness of breath. EXAM: PORTABLE CHEST 1 VIEW COMPARISON:  12/08/2021  FINDINGS: 0723 hours. Low IM film. The cardio pericardial silhouette is enlarged. Interval increase in bibasilar atelectasis. No substantial pleural effusion or pulmonary edema. The visualized bony structures of the thorax are unremarkable. Telemetry leads overlie the chest. IMPRESSION: Interval progression of bibasilar atelectasis on this low volume film. Electronically Signed   By: Misty Stanley M.D.   On: 12/13/2021 07:49    Family Communication: [name], daughter at bedside  Disposition: Status is: Inpatient Remains inpatient appropriate because: unsafe DC. Requires rehabilitation prior to returning home. Planned Discharge Destination: Home with Home Health vs. SNF based on functional mobility.    Patrecia Pour, MD 12/14/2021 3:05 PM Page by [email or web address]

## 2021-12-14 NOTE — Progress Notes (Signed)
Physical Therapy Treatment Patient Details Name: Valerie Mcclure MRN: [id number] DOB: 1940/11/29 Today's Date: 12/14/2021   History of Present Illness The pt is an 81 y/o female admitted 5/22 with cone with syncope  via EMS from home.  MRI negative for acute abnormality.  Found with hypotension, sepsis. PMHx: persistent Afib, chronic dHF, HTN, HLD, chronic sacral wound, CKD stage IV    PT Comments    The pt was seen for mobility session prior to planned d/c this afternoon. The pt required significantly more assist for all mobility, increased cues and assist for sequencing all tasks. The pt required up to maxA to complete transition to sitting EOB, modA to maintain static sitting, and mod-maxA to complete transfer to and from [facility]. The pt was limited to 20 ft ambulation with maxA, a significant regression from prior session where the pt required minA for 169f. Additionally, the pt has 5 steps to enter without railings, will benefit from additional therapy sessions prior to return home to progress mobility and complete stair training.   Given the pt's mobility today, I would typically recommend SNF rehab, but the pt's daughter declined multiple times during the session stating she would prefer to remain admitted until safe for d/c.     Recommendations for follow up therapy are one component of a multi-disciplinary discharge planning process, led by the attending physician.  Recommendations may be updated based on patient status, additional functional criteria and insurance authorization.  Follow Up Recommendations  Home health PT (I recommend SNF, family wanting to return home with HHPT. will need max services if they continue to decline SNF)     Assistance Recommended at Discharge Frequent or constant Supervision/Assistance  Patient can return home with the following Assist for transportation;Help with stairs or ramp for entrance;Two people to help with walking and/or transfers;Two people to  help with bathing/dressing/bathroom;Assistance with cooking/housework;Direct supervision/assist for medications management;Direct supervision/assist for financial management   Equipment Recommendations  Rolling walker (2 wheels);BSC/3in1    Recommendations for Other Services       Precautions / Restrictions Precautions Precautions: Fall Precaution Comments: incontinent of urine with mobility (wears briefs at home), R-sided coordination deficits. Restrictions Weight Bearing Restrictions: No     Mobility  Bed Mobility Overal bed mobility: Needs Assistance Bed Mobility: Supine to Sit     Supine to sit: Mod assist, Max assist, HOB elevated Sit to supine: Total assist   General bed mobility comments: max A and cues for use of hands on bed rails, pt unable to scoot to EOB without assist for bed pad and at trunk to maintain balance    Transfers Overall transfer level: Needs assistance Equipment used: Rolling walker (2 wheels) Transfers: Sit to/from Stand, Bed to chair/wheelchair/BSC Sit to Stand: Mod assist, Max assist   Step pivot transfers: Max assist       General transfer comment: modA to power up, cues for hand placement with pt not following. Not using RUE at all unless held on bed or RW. minimal initiation, strong posterior lean. maxA to assist with RW and sequence turning. [facility] early [facility]   Ambulation/Gait Ambulation/Gait assistance: Max assist Gait Distance (Feet): 20 Feet Assistive device: Rolling walker (2 wheels) Gait Pattern/deviations: Step-to pattern, Decreased stride length, Decreased weight shift to right, Knee flexed in stance - right, Knee flexed in stance - left, Knees buckling, Shuffle, Trunk flexed Gait velocity: decreased     General Gait Details: pt with minimal wt  through RLE or use of RUE. buckling at times to RLE. mod-maxA to maintain upright, pt unable to sequence turning without totalA to move RW and  cues for each step      Balance Overall balance assessment: Needs assistance Sitting-balance support: No upper extremity supported, Feet unsupported Sitting balance-Leahy Scale: Poor Sitting balance - Comments: pt falling over multiple times in sitting, no postural awareness or initiation to correct LOB without cues or direct balance correction. Postural control: Posterior lean Standing balance support: Bilateral upper extremity supported, During functional activity Standing balance-Leahy Scale: Poor Standing balance comment: dependent on BUE support and up to maxA from therapist to maintain standing                            Cognition Arousal/Alertness: Awake/alert Behavior During Therapy: WFL for tasks assessed/performed Overall Cognitive Status: History of cognitive impairments - at baseline Area of Impairment: Orientation, Awareness, Problem solving, Safety/judgement, Memory, Following commands, Attention                 Orientation Level: Disoriented to, Time, Situation Current Attention Level: Focused Memory: Decreased recall of precautions, Decreased short-term memory Following Commands: Follows one step commands inconsistently, Follows one step commands with increased time Safety/Judgement: Decreased awareness of safety, Decreased awareness of deficits Awareness: Emergent Problem Solving: Slow processing, Difficulty sequencing, Requires verbal cues, Decreased initiation General Comments: pt needing cues for all movements, at times needing repetition and hand-over-hand assist to complete. pt answering some questions appropriately, unable to warn of incontinence or explain mobility at home           General Comments General comments (skin integrity, edema, etc.): VSS on RA      Pertinent Vitals/Pain Pain Assessment Pain Assessment: No/denies pain Pain Intervention(s): Monitored during session     PT Goals (current goals can now be found in the care  plan section) Acute Rehab PT Goals Patient Stated Goal: none stated PT Goal Formulation: With patient Time For Goal Achievement: 12/23/21 Potential to Achieve Goals: Fair Progress towards PT goals: Not progressing toward goals - comment (regression from last session)    Frequency    Min 3X/week      PT Plan Current plan remains appropriate       AM-PAC PT "6 Clicks" Mobility   Outcome Measure  Help needed turning from your back to your side while in a flat bed without using bedrails?: A Lot Help needed moving from lying on your back to sitting on the side of a flat bed without using bedrails?: A Lot Help needed moving to and from a bed to a chair (including a wheelchair)?: A Lot Help needed standing up from a chair using your arms (e.g., wheelchair or bedside chair)?: A Lot Help needed to walk in hospital room?: A Lot Help needed climbing 3-5 steps with a railing? : Total 6 Click Score: 11    End of Session Equipment Utilized During Treatment: Gait belt Activity Tolerance: Patient limited by fatigue Patient left: in bed;with call bell/phone within reach;with bed alarm set;with family/visitor present Nurse Communication: Mobility status PT Visit Diagnosis: Unsteadiness on feet (R26.81);Other abnormalities of gait and mobility (R26.89);Other symptoms and signs involving the nervous system (U20.254)     Time: 2706-2376 PT Time Calculation (min) (ACUTE ONLY): 41 min  Charges:  $Gait Training: 8-22 mins $Therapeutic Exercise: 8-22 mins $Therapeutic Activity: 8-22 mins  [place], PT, DPT   Acute Rehabilitation Department Pager #: [phone]   Sandra Cockayne 12/14/2021, 3:19 PM

## 2021-12-15 DIAGNOSIS — R471 Dysarthria and anarthria: Secondary | ICD-10-CM | POA: Diagnosis not present

## 2021-12-15 DIAGNOSIS — G9341 Metabolic encephalopathy: Secondary | ICD-10-CM | POA: Diagnosis not present

## 2021-12-15 DIAGNOSIS — R55 Syncope and collapse: Secondary | ICD-10-CM | POA: Diagnosis not present

## 2021-12-15 DIAGNOSIS — N179 Acute kidney failure, unspecified: Secondary | ICD-10-CM | POA: Diagnosis not present

## 2021-12-15 LAB — BASIC METABOLIC PANEL
Anion gap: 7 (ref 5–15)
BUN: 12 mg/dL (ref 8–23)
CO2: 23 mmol/L (ref 22–32)
Calcium: 8.8 mg/dL — ABNORMAL LOW (ref 8.9–10.3)
Chloride: 111 mmol/L (ref 98–111)
Creatinine, Ser: 1.42 mg/dL — ABNORMAL HIGH (ref 0.44–1.00)
GFR, Estimated: 37 mL/min — ABNORMAL LOW (ref >60)
Glucose, Bld: 138 mg/dL — ABNORMAL HIGH (ref 70–99)
Potassium: 4.1 mmol/L (ref 3.5–5.1)
Sodium: 141 mmol/L (ref 135–145)

## 2021-12-15 LAB — URINALYSIS, ROUTINE W REFLEX MICROSCOPIC
Bilirubin Urine: NEGATIVE
Glucose, UA: NEGATIVE mg/dL
Hgb urine dipstick: NEGATIVE
Ketones, ur: NEGATIVE mg/dL
Leukocytes,Ua: NEGATIVE
Nitrite: NEGATIVE
Protein, ur: NEGATIVE mg/dL
Specific Gravity, Urine: 1.005 (ref 1.005–1.030)
pH: 5 (ref 5.0–8.0)

## 2021-12-15 LAB — CBC
HCT: 29 % — ABNORMAL LOW (ref 36.0–46.0)
Hemoglobin: 9.2 g/dL — ABNORMAL LOW (ref 12.0–15.0)
MCH: 31.4 pg (ref 26.0–34.0)
MCHC: 31.7 g/dL (ref 30.0–36.0)
MCV: 99 fL (ref 80.0–100.0)
Platelets: 208 10*3/uL (ref 150–400)
RBC: 2.93 MIL/uL — ABNORMAL LOW (ref 3.87–5.11)
RDW: 16.8 % — ABNORMAL HIGH (ref 11.5–15.5)
WBC: 8 10*3/uL (ref 4.0–10.5)
nRBC: 0 % (ref 0.0–0.2)

## 2021-12-15 MED ORDER — POLYETHYLENE GLYCOL 3350 17 G PO PACK
17.0000 g | PACK | Freq: Every day | ORAL | Status: DC
  Administered 2021-12-15 – 2021-12-17 (×3): 17 g via ORAL
  Filled 2021-12-15 (×3): qty 1

## 2021-12-15 MED ORDER — SENNOSIDES-DOCUSATE SODIUM 8.6-50 MG PO TABS
1.0000 | ORAL_TABLET | Freq: Every day | ORAL | Status: DC
  Administered 2021-12-15: 1 via ORAL
  Filled 2021-12-15: qty 1

## 2021-12-15 MED ORDER — SENNOSIDES-DOCUSATE SODIUM 8.6-50 MG PO TABS
2.0000 | ORAL_TABLET | Freq: Two times a day (BID) | ORAL | Status: DC
  Administered 2021-12-15 – 2021-12-17 (×3): 2 via ORAL
  Filled 2021-12-15 (×3): qty 2

## 2021-12-15 MED ORDER — TORSEMIDE 20 MG PO TABS
20.0000 mg | ORAL_TABLET | Freq: Every day | ORAL | Status: DC
  Administered 2021-12-15 – 2021-12-17 (×3): 20 mg via ORAL
  Filled 2021-12-15 (×3): qty 1

## 2021-12-15 MED ORDER — BISACODYL 10 MG RE SUPP
10.0000 mg | Freq: Once | RECTAL | Status: AC
  Administered 2021-12-15: 10 mg via RECTAL
  Filled 2021-12-15: qty 1

## 2021-12-15 NOTE — Progress Notes (Signed)
[id number] pt noted to have 652 mls in bladder pt denies pain to abdomen or desire to pee. Received order for I&O cath. 1063m noted. On call provider informed of this.

## 2021-12-15 NOTE — Progress Notes (Addendum)
Occupational Therapy Treatment Patient Details Name: Valerie Mcclure MRN: [id number] DOB: 10-23-1940 Today's Date: 12/15/2021   History of present illness The pt is an 80 y/o female admitted 5/22 with cone with syncope  via EMS from home.  MRI negative for acute abnormality.  Found with hypotension, sepsis. PMHx: persistent Afib, chronic dHF, HTN, HLD, chronic sacral wound, CKD stage IV   OT comments  Pt supine in bed and agreeable to OT session, daughter at side. Patient continues to require increased assist for transfers and ADLS today.  Reviewed safety and recommendations with pt and daughter. Plans to use [facility] for toileting needs, educated on stand pivot transfers with recommendations of +2 assist for safety (daughter reports this is not available). Pt unable to sequence or coordinate use of RW today, demonstrates heavy posterior lean and requires max assist to maintain upright position once standing. Max assist for stand pivot to/from [facility] with therapist. Educated on safety with seated ADLs, hands on assist for ADLS.   Pt with poor awareness, safety, problem solving and recall during session.  Daughter reports they will use the [facility] at home and have had to help with transfers in the past, daughter only appears concerned with steps into house.  Messaged PT to address steps.  Updated dc plan to SNF, but family declining and will need maximal HH services including [facility], PT and aide.    Recommendations for follow up therapy are one component of a multi-disciplinary discharge planning process, led by the attending physician.  Recommendations may be updated based on patient status, additional functional criteria and insurance authorization.    Follow Up Recommendations  Skilled nursing-short term rehab (<3 hours/day) (declining SNF, will need maximal HHOT services)    Assistance Recommended at Discharge Frequent or constant Supervision/Assistance  Patient can return home with the following  A lot of  help with walking and/or transfers;A lot of help with bathing/dressing/bathroom;Assistance with cooking/housework;Direct supervision/assist for medications management;Direct supervision/assist for financial management;Assist for transportation;Help with stairs or ramp for entrance   Equipment Recommendations  BSC/3in1    Recommendations for Other Services      Precautions / Restrictions Precautions Precautions: Fall Precaution Comments: incontinent of urine with mobility (wears briefs at home), R-sided coordination deficits. Restrictions Weight Bearing Restrictions: No       Mobility Bed Mobility Overal bed mobility: Needs Assistance Bed Mobility: Supine to Sit, Sit to Supine     Supine to sit: Max assist, HOB elevated Sit to supine: Max assist   General bed mobility comments: max assist for trunk and scooting hips to EOB, returned to bed with max assist    Transfers Overall transfer level: Needs assistance Equipment used: Rolling walker (2 wheels) Transfers: Sit to/from Stand, Bed to chair/wheelchair/BSC Sit to Stand: Min assist Stand pivot transfers: Max assist         General transfer comment: min assist to power up and steady from EOB with max cueing for forward lean and sequencing, pt demonstrating poor control of R LE and requires therapist to block foot.  Unable to sequence stepping or pivot with RW, transitioned to stand pivot without RW and completed with max assist.  Consistent heavy posterior lean.     Balance Overall balance assessment: Needs assistance Sitting-balance support: No upper extremity supported, Feet supported Sitting balance-Leahy Scale: Fair Sitting balance - Comments: static sitting with min guard Postural control: Posterior lean Standing balance support: Bilateral upper extremity supported, During functional activity Standing balance-Leahy Scale: Poor Standing balance comment: dependent on  BUE support and up to maxA from therapist to  maintain standing                           ADL either performed or assessed with clinical judgement   ADL Overall ADL's : Needs assistance/impaired                     Lower Body Dressing: Total assistance;Sit to/from stand;Sitting/lateral leans   Toilet Transfer: Maximal assistance;Stand-pivot;BSC/3in1 Toilet Transfer Details (indicate cue type and reason): attempted with RW, Toileting- Clothing Manipulation and Hygiene: Total assistance;Sit to/from stand       Functional mobility during ADLs: Maximal assistance;Cueing for safety;Cueing for sequencing      Extremity/Trunk Assessment              Vision       Perception     Praxis      Cognition Arousal/Alertness: Awake/alert Behavior During Therapy: WFL for tasks assessed/performed Overall Cognitive Status: History of cognitive impairments - at baseline Area of Impairment: Orientation, Awareness, Problem solving, Safety/judgement, Memory, Following commands, Attention                 Orientation Level: Disoriented to, Situation Current Attention Level: Focused Memory: Decreased recall of precautions, Decreased short-term memory Following Commands: Follows one step commands inconsistently, Follows one step commands with increased time Safety/Judgement: Decreased awareness of safety, Decreased awareness of deficits Awareness: Emergent Problem Solving: Slow processing, Decreased initiation, Difficulty sequencing, Requires verbal cues, Requires tactile cues General Comments: pt with poor ability to initate, sequence and follow simple commands.  Constant redirection and poor awareness to deficits.        Exercises      Shoulder Instructions       General Comments VSS on RA    Pertinent Vitals/ Pain       Pain Assessment Pain Assessment: No/denies pain Pain Intervention(s): Monitored during session  Home Living                                          Prior  Functioning/Environment              Frequency  Min 2X/week        Progress Toward Goals  OT Goals(current goals can now be found in the care plan section)  Progress towards OT goals: Progressing toward goals  Acute Rehab OT Goals Patient Stated Goal: home OT Goal Formulation: With patient/family Time For Goal Achievement: 12/23/21 Potential to Achieve Goals: Good  Plan Frequency remains appropriate;Discharge plan needs to be updated    Co-evaluation                 AM-PAC OT "6 Clicks" Daily Activity     Outcome Measure   Help from another person eating meals?: A Little Help from another person taking care of personal grooming?: A Little Help from another person toileting, which includes using toliet, bedpan, or urinal?: Total Help from another person bathing (including washing, rinsing, drying)?: A Lot Help from another person to put on and taking off regular upper body clothing?: A Lot Help from another person to put on and taking off regular lower body clothing?: Total 6 Click Score: 12    End of Session Equipment Utilized During Treatment: Gait belt;Rolling walker (2 wheels)  OT Visit Diagnosis: Other abnormalities of gait  and mobility (R26.89);Muscle weakness (generalized) (M62.81);Other symptoms and signs involving cognitive function;History of falling (Z91.81)   Activity Tolerance Patient tolerated treatment well   Patient Left in bed;with call bell/phone within reach;with bed alarm set;with family/visitor present   Nurse Communication Mobility status        Time: 7741-2878 OT Time Calculation (min): 27 min  Charges: OT General Charges $OT Visit: 1 Visit OT Treatments $Self Care/Home Management : 23-37 mins  [place] Pager [phone] [place] 12/15/2021, 1:21 PM

## 2021-12-15 NOTE — Progress Notes (Signed)
Progress Note  Patient: Valerie Mcclure FIE:[id number] DOB: 31-Oct-1940  DOA: 12/08/2021  DOS: 12/15/2021    Brief hospital course: Valerie Mcclure is an 81 y.o. female with a history of persistent AFib on eliquis, chronic HFpEF, HTN, HLD, hypothyroidism, CKD 4 presented to the ED 5/22 after being found unresponsive in her bathroom with a heart rate of 30 and her right arm was contractured. ECG in ED showed rate-controlled AFib, labs showed AKI hypoalbuminemia, anemia. Chest x-ray no active disease, CT head code stroke no acute finding, redemonstration of chronic left MCA territory infarct within the left temporal lobe insula and basal ganglia chronic small vessel changes. Neurology was consulted, patient was admitted for further management. MRI showed no acute stroke.   Metoprolol was held with improvement in bradycardic rate. IVF given with improvement in renal function. On 5/27 she developed RVR for which metoprolol was restarted with improvement.   Assessment and Plan: Syncope, found down: No acute stroke on imaging. Suspect dehydration w/AKI caused weakness. No localizing symptoms or signs of infection found.  - Orthostatic vital signs are negative.  Persistent atrial flutter / atrial fibrillation with RVR: Provoked in setting of holding beta blocker on admission due to concern for bradycardic rate and syncope.  - Continue eliquis - Restarted metoprolol with improvement. Rates normalized. Will return to home dosing.  AKI on stage IV CKD: Improving. NAGMA also improving with fluids.  - Avoid nephrotoxins.  - Restart home torsemide since her oral intake remains adequate.  Chronic HFpEF, HTN:  - Hold lisinopril, hydralazine for now.Continue BB as above  Hypoxia: Seems to have resolved. No wheezing on exam currently.  - Incentive spirometry.   - Get OOB as often as possible.  Demand myocardial ischemia: In setting of RVR. Mildly elevated Tn without chest pain.  - Trend troponin  while controlling rate. Pt on anticoagulation, beta blocker and statin.  Stroke-like symptom: Has had repeatedly negative neuroimaging  Code stroke work up done with CT head in ED. MRI brain/MR angio head-no evidence of acute or subacute stroke, mildly limited by motion.Echo EF 65 to 70%, indeterminate diastolic parameters, normal RV function, mitral aortic valve grossly normal moderate AR.Carotid duplex unremarkable. Repeat CT 5/27 without acute findings.   Acute metabolic encephalopathy suspect delirium Baseline memory deficit/cognitive impairment: Patient with delirium, so far infectious etiology unremarkable repeat UA-negative leukocytes and nitrite. MRI brain no acute finding.  Mostly hypotensive orthostatic and component of dehydration and AKI.  Mental status significantly improved but continues to wax and wane.  - Delirium precautions.  - Family remaining at bed.  - Minimize sedatives.   - Ideally would abbreviate hospitalization and return to familiar environment. Her deconditioning currently requires 2+ assist. I have serious apprehension attempting to discharge her home. Regardless, she's now retaining urine and constipated. Will treat these and continue serial therapy sessions.     SIRS: Sepsis ruled out. blood culture ordered sent so far negative, UA  cxr 5/22- no e/o infection.  Remains afebrile leukocytosis initially mild and now resolved, patient on ceftriaxone..  Family endorses similar presentation when patient has UTI repeat UA is unremarkable.  Discontinue antibiotics.   Constipation: Benign exam at this time.  - Needs to get OOB as much as possible - Senna, miralax, and dulcolax suppository. Can give enema if needed.  - Plan to stay on regular regimen after this has improved.   Acute urinary retention:  - Check UA - Suspect resolution of constipation will help this. Treat that as above.  -  Consider tamsulosin - Monitor serial bladder scans.  - Monitor renal function.    Aortic regurgitation: Moderate by echo.  - Cardiology follow up.  Hypothyroidism: TSH mildly elevated at 6.8, free T4 wnl. Clinically not hypothyroid.  - Continue synthroid.  Lower extremity edema: chronic. No DVT on U/S. ABIs were also checked and normal.   Depression: Quiescent   HLD: Statin  AOCD: Stable.   DNR  Subjective: Primary complaint is that she's only had one BM since she's been here. She's also retaining urine overnight.   Objective: Vitals:   12/15/21 0038 12/15/21 0429 12/15/21 0753 12/15/21 1156  BP: 140/67 123/64 121/69 (!) 120/58  Pulse: 85 79 70 83  Resp: '17  14 20  '$ Temp: 98 F (36.7 C) 98.4 F (36.9 C) 97.9 F (36.6 C) 98 F (36.7 C)  TempSrc: Oral Oral Oral Oral  SpO2: 100% 99% 100% 94%  Weight:      Height:       Gen: Frail, elderly female in no distress Pulm: Nonlabored breathing room air. Mild crackles at bases. CV: Regular rate and rhythm. No new murmur, rub, or gallop. No JVD, + dependent edema. GI: Abdomen is soft, more protuberant but not focally tender. Not taut. +BS.   Ext: Warm, no deformities Skin: No rashes, lesions or ulcers on visualized skin. Lower leg hyperpigmentation bilaterally Neuro: Alert and oriented. Stable speech abnormalities. No focal neurological deficits. Psych: Judgement and insight appear fair. Mood euthymic & affect congruent. Behavior is appropriate.    Data Personally reviewed: CBC: Recent Labs  Lab 12/08/21 1628 12/08/21 1629 12/09/21 0409 12/10/21 0252 12/11/21 0215 12/11/21 1951 12/12/21 0452 12/15/21 0044  WBC 10.6*  --  11.3* 9.1 7.4  --  6.7 8.0  NEUTROABS 7.9*  --  8.7*  --   --   --   --   --   HGB 11.4*   < > 10.1* 9.2* 7.7* 8.9* 9.3* 9.2*  HCT 35.8*   < > 32.2* 28.8* 24.2* 27.9* 28.8* 29.0*  MCV 99.4  --  98.5 97.3 98.8  --  97.0 99.0  PLT 204  --  166 156 146*  --  140* 208   < > = values in this interval not displayed.   Basic Metabolic Panel: Recent Labs  Lab 12/09/21 0409  12/10/21 0252 12/11/21 0215 12/12/21 0452 12/13/21 0333 12/14/21 0233 12/15/21 0044  NA 139 139 140 140 140 139 141  K 4.2 4.5 3.9 4.0 3.9 3.7 4.1  CL 114* 116* 118* 116* 113* 112* 111  CO2 15* 16* 16* 17* 20* 22 23  GLUCOSE 92 96 141* 95 123* 119* 138*  BUN 55* 40* 26* '15 11 11 12  '$ CREATININE 2.16* 1.93* 1.58* 1.38* 1.22* 1.39* 1.42*  CALCIUM 8.3* 8.3* 8.2* 8.4* 8.6* 8.3* 8.8*  MG 1.7 1.7 1.7 1.7  --   --   --    GFR: Estimated Creatinine Clearance: 31.8 mL/min (A) (by C-G formula based on SCr of 1.42 mg/dL (H)). Liver Function Tests: Recent Labs  Lab 12/08/21 1628 12/09/21 0409 12/10/21 0252 12/11/21 0215 12/12/21 0452  AST '25 24 26 24 23  '$ ALT '26 24 23 21 21  '$ ALKPHOS 79 67 67 56 59  BILITOT 0.4 0.5 0.8 0.2* 0.5  PROT 5.8* 5.0* 4.9* 4.7* 5.0*  ALBUMIN 2.6* 2.2* 2.1* 2.4* 2.4*   Coagulation Profile: Recent Labs  Lab 12/08/21 1628  INR 1.4*   Cardiac Enzymes: Recent Labs  Lab 12/09/21 0409  CKTOTAL 55  Urine analysis:    Component Value Date/Time   COLORURINE YELLOW 12/11/2021 1450   APPEARANCEUR CLEAR 12/11/2021 1450   LABSPEC 1.014 12/11/2021 1450   PHURINE 5.0 12/11/2021 1450   GLUCOSEU NEGATIVE 12/11/2021 1450   GLUCOSEU NEGATIVE 11/18/2010 0859   HGBUR NEGATIVE 12/11/2021 1450   HGBUR negative 12/13/2008 0944   BILIRUBINUR NEGATIVE 12/11/2021 1450   KETONESUR NEGATIVE 12/11/2021 1450   PROTEINUR NEGATIVE 12/11/2021 1450   UROBILINOGEN 0.2 11/18/2010 0859   NITRITE NEGATIVE 12/11/2021 1450   LEUKOCYTESUR NEGATIVE 12/11/2021 1450   Recent Results (from the past 240 hour(s))  Culture, blood (single)     Status: None   Collection Time: 12/08/21  6:05 PM   Specimen: BLOOD  Result Value Ref Range Status   Specimen Description BLOOD RIGHT ANTECUBITAL  Final   Special Requests   Final    BOTTLES DRAWN AEROBIC AND ANAEROBIC Blood Culture results may not be optimal due to an inadequate volume of blood received in culture bottles   Culture   Final     NO GROWTH 5 DAYS Performed at [facility], [place] [address], [place], [place] [address]    Report Status 12/13/2021 FINAL  Final     CT HEAD WO CONTRAST (5MM)  Result Date: 12/13/2021 CLINICAL DATA:  Mental status change of unknown etiology. EXAM: CT HEAD WITHOUT CONTRAST TECHNIQUE: Contiguous axial images were obtained from the base of the skull through the vertex without intravenous contrast. RADIATION DOSE REDUCTION: This exam was performed according to the departmental dose-optimization program which includes automated exposure control, adjustment of the mA and/or kV according to patient size and/or use of iterative reconstruction technique. COMPARISON:  MR brain 12/08/2021 FINDINGS: Brain: No evidence of acute infarction, hemorrhage, hydrocephalus, extra-axial collection or mass lesion/mass effect. Again noted are changes secondary to remote left MCA infarct involving the left basal ganglia, left temporal lobe, and left insula. Ex vacuo dilatation of the left lateral ventricle is noted and there is well layering in degeneration in the left cerebral peduncle. Prominence of the sulci and ventricles compatible with brain atrophy. Vascular: No hyperdense vessel or unexpected calcification. Skull: Normal. Negative for fracture or focal lesion. Sinuses/Orbits: No acute finding. Other: None IMPRESSION: 1. No acute intracranial abnormalities. 2. Chronic left MCA infarct. 3. Chronic small vessel ischemic disease and brain atrophy. Electronically Signed   By: Kerby Moors M.D.   On: 12/13/2021 17:53    Family Communication: [name], daughter at bedside. Will call son this afternoon.  Disposition: Status is: Inpatient Remains inpatient appropriate because: unsafe DC. Requires rehabilitation prior to returning home. PT/OT reevaluating today. Planned Discharge Destination: Home with Home Health vs. SNF based on functional mobility.    Patrecia Pour, MD 12/15/2021 2:53 PM Page by [email or web address]

## 2021-12-15 NOTE — Plan of Care (Signed)
Pt is alert oriented x 3. Pt denies pain. Pt has SOB with exertion.  Pt has been turned and repositioned. Dressing to left buttock has been changed per order.

## 2021-12-15 NOTE — Progress Notes (Signed)
Pt has been turned and repositioned. No distress noted

## 2021-12-15 NOTE — Progress Notes (Signed)
Physical Therapy Treatment Patient Details Name: Valerie Mcclure MRN: [id number] DOB: Jan 16, 1941 Today's Date: 12/15/2021   History of Present Illness The pt is an 81 y/o female admitted 5/22 with cone with syncope  via EMS from home.  MRI negative for acute abnormality.  Found with hypotension, sepsis. PMHx: persistent Afib, chronic dHF, HTN, HLD, chronic sacral wound, CKD stage IV    PT Comments    Slow to progress toward goals.  Emphasis today on pt/dtr's education on bed mobility/handling technique, sit to stand safety/technique, gait stability and stair training.   Recommendations for follow up therapy are one component of a multi-disciplinary discharge planning process, led by the attending physician.  Recommendations may be updated based on patient status, additional functional criteria and insurance authorization.  Follow Up Recommendations  Other (comment)     Assistance Recommended at Discharge Frequent or constant Supervision/Assistance  Patient can return home with the following A lot of help with bathing/dressing/bathroom;A lot of help with walking and/or transfers;Assistance with cooking/housework;Direct supervision/assist for medications management;Direct supervision/assist for financial management;Assist for transportation;Help with stairs or ramp for entrance   Equipment Recommendations  Rolling walker (2 wheels);BSC/3in1;Other (comment)    Recommendations for Other Services       Precautions / Restrictions Precautions Precautions: Fall Precaution Comments: incontinent of urine with mobility (wears briefs at home), R-sided coordination deficits. Restrictions Weight Bearing Restrictions: No     Mobility  Bed Mobility Overal bed mobility: Needs Assistance Bed Mobility: Supine to Sit, Sit to Supine     Supine to sit: Max assist Sit to supine: Max assist, +2 for physical assistance, +2 for safety/equipment   General bed mobility comments: cues for  sequencing, direction, minimal help with BLE's, maximal truncal assist up to midline and assist pivoting.  mod assist to scoot to EOB, Return with cues for sequencing, full assist of LE's into bed, control of return to supine.  max to bridge into middle of the bed.    Transfers Overall transfer level: Needs assistance Equipment used: Rolling walker (2 wheels) Transfers: Sit to/from Stand Sit to Stand: Min assist   Step pivot transfers: Mod assist       General transfer comment: cues for hand placement and to scoot to edge of the chair.  min assist more for forward than boost.    Ambulation/Gait Ambulation/Gait assistance: Mod assist Gait Distance (Feet): 20 Feet Assistive device: Rolling walker (2 wheels) Gait Pattern/deviations: Step-through pattern, Step-to pattern Gait velocity: decreased Gait velocity interpretation: <1.31 ft/sec, indicative of household ambulator   General Gait Details: pt still showing L hip drop with some trouble clearing L LE with swing through   Stairs Stairs: Yes Stairs assistance: Max assist, +2 safety/equipment, +2 physical assistance Stair Management: One rail Left, Step to pattern, Forwards Number of Stairs: 2 General stair comments: step to pattern with left LE due to R hip/pelvis weak and pt can't clear L foot well due to hip drop.  Best up with L LE with L rail and mod/max lift assist and +2 stability assist.   Wheelchair Mobility    Modified Rankin (Stroke Patients Only)       Balance Overall balance assessment: Needs assistance   Sitting balance-Leahy Scale: Fair       Standing balance-Leahy Scale: Poor Standing balance comment: dependent on BUE support and up to mod/maxA  from therapist to maintain standing  Cognition Arousal/Alertness: Awake/alert Behavior During Therapy: WFL for tasks assessed/performed Overall Cognitive Status: History of cognitive impairments - at baseline (NT  formally)                                          Exercises      General Comments General comments (skin integrity, edema, etc.): pt's daughter can not bring herself to send pt to SNF for rehab.  Therefore she participated in bed mobility handling techniques with bed pad assist, watched and participated in transitions in/out of bed.  Cued her and pt on technique to sit/stand/sit with hand placement and approp starting position/foundation.  And participate secondarily in stair training with several options for ingress given.      Pertinent Vitals/Pain Pain Assessment Pain Assessment: No/denies pain Pain Intervention(s): Monitored during session    Home Living                          Prior Function            PT Goals (current goals can now be found in the care plan section) Acute Rehab PT Goals Patient Stated Goal: pt's dtr wants pt to go straight home after discharge. PT Goal Formulation: With patient Time For Goal Achievement: 12/23/21 Potential to Achieve Goals: Fair Progress towards PT goals: Progressing toward goals    Frequency    Min 3X/week      PT Plan Current plan remains appropriate    Co-evaluation              AM-PAC PT "6 Clicks" Mobility   Outcome Measure  Help needed turning from your back to your side while in a flat bed without using bedrails?: A Lot Help needed moving from lying on your back to sitting on the side of a flat bed without using bedrails?: A Lot Help needed moving to and from a bed to a chair (including a wheelchair)?: A Lot Help needed standing up from a chair using your arms (e.g., wheelchair or bedside chair)?: A Little Help needed to walk in hospital room?: A Lot Help needed climbing 3-5 steps with a railing? : Total 6 Click Score: 12    End of Session   Activity Tolerance: Patient tolerated treatment well;Patient limited by fatigue Patient left: in bed;with call bell/phone within reach;with  bed alarm set;with family/visitor present Nurse Communication: Mobility status PT Visit Diagnosis: Unsteadiness on feet (R26.81);Other abnormalities of gait and mobility (R26.89);Other symptoms and signs involving the nervous system (R29.898)     Time: 1455-1540 PT Time Calculation (min) (ACUTE ONLY): 45 min  Charges:  $Gait Training: 8-22 mins $Therapeutic Activity: 8-22 mins $Self Care/Home Management: 8-22                     12/15/2021  Ginger Carne., PT Acute Rehabilitation Services [phone]  (pager) [phone]  (office)   Valerie Mcclure 12/15/2021, 3:57 PM

## 2021-12-16 ENCOUNTER — Telehealth: Payer: Self-pay

## 2021-12-16 DIAGNOSIS — R471 Dysarthria and anarthria: Secondary | ICD-10-CM | POA: Diagnosis not present

## 2021-12-16 DIAGNOSIS — N179 Acute kidney failure, unspecified: Secondary | ICD-10-CM | POA: Diagnosis not present

## 2021-12-16 DIAGNOSIS — R55 Syncope and collapse: Secondary | ICD-10-CM | POA: Diagnosis not present

## 2021-12-16 DIAGNOSIS — G9341 Metabolic encephalopathy: Secondary | ICD-10-CM | POA: Diagnosis not present

## 2021-12-16 LAB — BASIC METABOLIC PANEL
Anion gap: 6 (ref 5–15)
BUN: 13 mg/dL (ref 8–23)
CO2: 24 mmol/L (ref 22–32)
Calcium: 8.2 mg/dL — ABNORMAL LOW (ref 8.9–10.3)
Chloride: 106 mmol/L (ref 98–111)
Creatinine, Ser: 1.46 mg/dL — ABNORMAL HIGH (ref 0.44–1.00)
GFR, Estimated: 36 mL/min — ABNORMAL LOW (ref >60)
Glucose, Bld: 116 mg/dL — ABNORMAL HIGH (ref 70–99)
Potassium: 3.6 mmol/L (ref 3.5–5.1)
Sodium: 136 mmol/L (ref 135–145)

## 2021-12-16 MED ORDER — TAMSULOSIN HCL 0.4 MG PO CAPS
0.4000 mg | ORAL_CAPSULE | Freq: Every day | ORAL | Status: DC
  Administered 2021-12-16 – 2021-12-17 (×2): 0.4 mg via ORAL
  Filled 2021-12-16 (×2): qty 1

## 2021-12-16 MED ORDER — APIXABAN 2.5 MG PO TABS
2.5000 mg | ORAL_TABLET | Freq: Two times a day (BID) | ORAL | Status: DC
  Administered 2021-12-16 – 2021-12-17 (×2): 2.5 mg via ORAL
  Filled 2021-12-16 (×3): qty 1

## 2021-12-16 NOTE — Plan of Care (Signed)
Pt is alert oriented x 3. Pt is RA. Pt has been turned and repositioned. Pt bladder scanned 340 mls noted in bladder, I&O cath completed 625ms output. Pt denied discomfort or desire to void.  Pt has not had a bowel movement or voided on her own tonight. Last bladder scan at 0430, 319m.   Problem: Education: Goal: Knowledge of General Education information will improve Description: Including pain rating scale, medication(s)/side effects and non-pharmacologic comfort measures Outcome: Progressing   Problem: Health Behavior/Discharge Planning: Goal: Ability to manage health-related needs will improve Outcome: Progressing   Problem: Clinical Measurements: Goal: Ability to maintain clinical measurements within normal limits will improve Outcome: Progressing Goal: Will remain free from infection Outcome: Progressing Goal: Diagnostic test results will improve Outcome: Progressing Goal: Respiratory complications will improve Outcome: Progressing Goal: Cardiovascular complication will be avoided Outcome: Progressing   Problem: Activity: Goal: Risk for activity intolerance will decrease Outcome: Progressing   Problem: Nutrition: Goal: Adequate nutrition will be maintained Outcome: Progressing   Problem: Coping: Goal: Level of anxiety will decrease Outcome: Progressing   Problem: Elimination: Goal: Will not experience complications related to bowel motility Outcome: Progressing Goal: Will not experience complications related to urinary retention Outcome: Progressing   Problem: Pain Managment: Goal: General experience of comfort will improve Outcome: Progressing   Problem: Safety: Goal: Ability to remain free from injury will improve Outcome: Progressing   Problem: Skin Integrity: Goal: Risk for impaired skin integrity will decrease Outcome: Progressing

## 2021-12-16 NOTE — Telephone Encounter (Signed)
Bubba Hales from [facility] adult protective services is calling to see if there are any concerns with the pt, like is she following Dr. Kayleen Memos, appts and medications.  Please advise

## 2021-12-16 NOTE — Progress Notes (Signed)
OT Cancellation Note  Patient Details Name: Valerie Mcclure MRN: [id number] DOB: [birth year]   Cancelled Treatment:    Reason Eval/Treat Not Completed: Pain limiting ability to participate;Fatigue/lethargy limiting ability to participate;Other (comment) pt reports general malaise declining OT session at this time as pt reports just having gotten cleaned up after BM. Will check back as time allows for OT session.  Harley Alto., COTA/L Acute Rehabilitation Services [phone]   Precious Haws 12/16/2021, 2:54 PM

## 2021-12-16 NOTE — Progress Notes (Signed)
Progress Note  Patient: Valerie Mcclure EXH:[id number] DOB: [birth year]  DOA: 12/08/2021  DOS: 12/16/2021    Brief hospital course: PRESSLEY BARSKY is an 81 y.o. female with a history of persistent AFib on eliquis, chronic HFpEF, HTN, HLD, hypothyroidism, CKD 4 presented to the ED 5/22 after being found unresponsive in her bathroom with a heart rate of 30 and her right arm was contractured. ECG in ED showed rate-controlled AFib, labs showed AKI hypoalbuminemia, anemia. Chest x-ray no active disease, CT head code stroke no acute finding, redemonstration of chronic left MCA territory infarct within the left temporal lobe insula and basal ganglia chronic small vessel changes. Neurology was consulted, patient was admitted for further management. MRI showed no acute stroke.   Metoprolol was held with improvement in bradycardic rate. IVF given with improvement in renal function. On 5/27 she developed RVR for which metoprolol was restarted with improvement.   Assessment and Plan: Syncope, found down: No acute stroke on imaging. Suspect dehydration w/AKI caused weakness. No localizing symptoms or signs of infection found.  - Orthostatic vital signs are negative.  Persistent atrial flutter / atrial fibrillation with RVR: Provoked in setting of holding beta blocker on admission due to concern for bradycardic rate and syncope.  - Continue eliquis - Restarted metoprolol with improvement. Currently in NSR with intermittent 1st deg AVB and stable BBB on monitor.  AKI on stage IV CKD: Improving. NAGMA also improving with fluids.  - Avoid nephrotoxins.  - Restarted home torsemide since her oral intake remains adequate. Cr stable.  Chronic HFpEF, HTN:  - Hold lisinopril, hydralazine for now. Continue BB as above  Hypoxia: Seems to have resolved. No wheezing on exam currently.  - Incentive spirometry.   - Get OOB as often as possible.  Demand myocardial ischemia: In setting of RVR. Mildly elevated Tn  without chest pain, has peaked. - Pt on anticoagulation, beta blocker and statin.  Stroke-like symptom: Has had repeatedly negative neuroimaging  Code stroke work up done with CT head in ED. MRI brain/MR angio head-no evidence of acute or subacute stroke, mildly limited by motion.Echo EF 65 to 70%, indeterminate diastolic parameters, normal RV function, mitral aortic valve grossly normal moderate AR.Carotid duplex unremarkable. Repeat CT 5/27 without acute findings.   Acute metabolic encephalopathy suspect delirium Baseline memory deficit/cognitive impairment: Patient with delirium, so far infectious etiology unremarkable repeat UA-negative leukocytes and nitrite. MRI brain no acute finding.  Mostly hypotensive orthostatic and component of dehydration and AKI.  Mental status significantly improved but continues to wax and wane.  - Delirium precautions.  - Family remaining at bed.  - Minimize sedatives.    Deconditioning: Continues to have care requirement for mobility that require SNF rehabilitation. Patient and family amenable. D/w CSW who is initiating bed search.    SIRS: Sepsis ruled out. blood culture ordered sent so far negative, UA  cxr 5/22- no e/o infection.  Remains afebrile leukocytosis initially mild and now resolved, patient on ceftriaxone..  Family endorses similar presentation when patient has UTI repeat UA is unremarkable.  Discontinue antibiotics.   Constipation: Had BM 5/29, abd very benign. - Continue OOB as much as possible - Senna, miralax scheduled continued.   Acute urinary retention: UA remains clean, Cr stable, has required many I/O's so will start flomax, insert foley and plan voiding trial in a couple days after she's been up OOB more.    Aortic regurgitation: Moderate by echo.  - Cardiology follow up.  Hypothyroidism: TSH mildly elevated at  6.8, free T4 wnl. Clinically not hypothyroid.  - Continue synthroid.  Lower extremity edema: chronic. No DVT on U/S. ABIs  were also checked and normal.   Depression: Quiescent   HLD: Statin  AOCD: Stable.   DNR  Subjective: Had "good sized" BM yesterday. Worked with PT, could get up a couple steps, but needs to get up 5 steps to get into house. Amenable to rehabilitation at this time. Still having urinary retention, though has no abd pain, dysuria, fever. No bleeding or back pain.  Objective: Vitals:   12/15/21 1938 12/15/21 2327 12/16/21 0327 12/16/21 0831  BP: (!) 133/57 127/67 (!) 126/58 (!) 117/58  Pulse: 78 74 71 70  Resp: '20 20 20 20  '$ Temp: 97.8 F (36.6 C) 98 F (36.7 C) 98 F (36.7 C) 97.6 F (36.4 C)  TempSrc: Oral   Oral  SpO2: 98% 100% 96% 98%  Weight:      Height:       Gen: Alert, elderly female in no distress Pulm: Nonlabored breathing room air. Clear. CV: Regular rate and rhythm. No murmur, rub, or gallop. No JVD, improved dependent edema. GI: Abdomen is completely soft, nontender, nondistended with hypoactive bowel sounds, no suprapubic fullness or tenderness. Ext: Warm, no deformities Skin: No new rashes, lesions or ulcers on visualized skin. Neuro: Alert and oriented. Speech abnormality is stable. No new focal neurological deficits. Psych: Judgement and insight appear fair. Mood euthymic & affect congruent. Behavior is appropriate.    Data Personally reviewed: CBC: Recent Labs  Lab 12/10/21 0252 12/11/21 0215 12/11/21 1951 12/12/21 0452 12/15/21 0044  WBC 9.1 7.4  --  6.7 8.0  HGB 9.2* 7.7* 8.9* 9.3* 9.2*  HCT 28.8* 24.2* 27.9* 28.8* 29.0*  MCV 97.3 98.8  --  97.0 99.0  PLT 156 146*  --  140* 962   Basic Metabolic Panel: Recent Labs  Lab 12/10/21 0252 12/11/21 0215 12/12/21 0452 12/13/21 0333 12/14/21 0233 12/15/21 0044 12/16/21 0727  NA 139 140 140 140 139 141 136  K 4.5 3.9 4.0 3.9 3.7 4.1 3.6  CL 116* 118* 116* 113* 112* 111 106  CO2 16* 16* 17* 20* '22 23 24  '$ GLUCOSE 96 141* 95 123* 119* 138* 116*  BUN 40* 26* '15 11 11 12 13  '$ CREATININE 1.93* 1.58*  1.38* 1.22* 1.39* 1.42* 1.46*  CALCIUM 8.3* 8.2* 8.4* 8.6* 8.3* 8.8* 8.2*  MG 1.7 1.7 1.7  --   --   --   --    GFR: Estimated Creatinine Clearance: 31 mL/min (A) (by C-G formula based on SCr of 1.46 mg/dL (H)). Liver Function Tests: Recent Labs  Lab 12/10/21 0252 12/11/21 0215 12/12/21 0452  AST '26 24 23  '$ ALT '23 21 21  '$ ALKPHOS 67 56 59  BILITOT 0.8 0.2* 0.5  PROT 4.9* 4.7* 5.0*  ALBUMIN 2.1* 2.4* 2.4*   Coagulation Profile: No results for input(s): INR, PROTIME in the last 168 hours.  Cardiac Enzymes: No results for input(s): CKTOTAL, CKMB, CKMBINDEX, TROPONINI in the last 168 hours.  Urine analysis:    Component Value Date/Time   COLORURINE STRAW (A) 12/15/2021 1732   APPEARANCEUR CLEAR 12/15/2021 1732   LABSPEC 1.005 12/15/2021 1732   PHURINE 5.0 12/15/2021 1732   GLUCOSEU NEGATIVE 12/15/2021 1732   GLUCOSEU NEGATIVE 11/18/2010 0859   HGBUR NEGATIVE 12/15/2021 1732   HGBUR negative 12/13/2008 [place] 12/15/2021 [place] 12/15/2021 1732   PROTEINUR NEGATIVE 12/15/2021 1732   UROBILINOGEN  0.2 11/18/2010 0859   NITRITE NEGATIVE 12/15/2021 1732   LEUKOCYTESUR NEGATIVE 12/15/2021 1732   Recent Results (from the past 240 hour(s))  Culture, blood (single)     Status: None   Collection Time: 12/08/21  6:05 PM   Specimen: BLOOD  Result Value Ref Range Status   Specimen Description BLOOD RIGHT ANTECUBITAL  Final   Special Requests   Final    BOTTLES DRAWN AEROBIC AND ANAEROBIC Blood Culture results may not be optimal due to an inadequate volume of blood received in culture bottles   Culture   Final    NO GROWTH 5 DAYS Performed at [facility], [place] [address], [place], [place] [address]    Report Status 12/13/2021 FINAL  Final     No results found.  Family Communication: [name], daughter at bedside. Son, [name], by phone.  Disposition: Status is: Inpatient Remains inpatient appropriate because: Unsafe DC. Requires  rehabilitation prior to returning home. Bed search in progress. Planned Discharge Destination: SNF.   Patrecia Pour, MD 12/16/2021 10:45 AM Page by [email or web address]

## 2021-12-16 NOTE — TOC Progression Note (Addendum)
Transition of Care [facility]) - Progression Note    Patient Details  Name: Valerie Mcclure MRN: [id number] Date of Birth: 16-Sep-1940  Transition of Care [facility]) CM/SW [place], [place] Phone Number: 12/16/2021, 10:41 AM  Clinical Narrative:    CSW spoke with son/POA, [name], to discuss change in disposition. Son confirms he has discussed with family and they think a short rehab stay will be beneficial. Son notes pt has not been before, but they do prefer [facility]. Plan is for pt to return home with dtr after SNF. CSW discussed insurance and [email or web address] CSW will fax out referrals. TOC will continue to follow for DC needs.  [facility] confirmed they can take pt, family agreeable. Authorization started. Expected Discharge Plan: Skilled Nursing Facility Barriers to Discharge: SNF Pending bed offer, Continued Medical Work up, Ship broker  Expected Discharge Plan and Services Expected Discharge Plan: [place]   Discharge Planning Services: CM Consult Post Acute Care Choice: [place] Living arrangements for the past 2 months: Single Family Home Expected Discharge Date: 12/15/21               DME Arranged: Berta Minor rolling DME Agency: [employer] Date DME Agency Contacted: 12/12/21     HH Arranged: PT, OT HH Agency: [place] Date [facility] Agency Contacted: 12/14/21 Time HH Agency Contacted: 1301 Representative spoke with at [place]: Cindie   Social Determinants of Health ([place]) Interventions    Readmission Risk Interventions     View : No data to display.

## 2021-12-16 NOTE — NC FL2 (Signed)
[place] LEVEL OF CARE SCREENING TOOL     IDENTIFICATION  Patient Name: Valerie Mcclure Birthdate: [birth year] Sex: female Admission Date (Current Location): 12/08/2021  [facility] and [place] Number:  Herbalist and Address:  [facility] [place], [place] [address], [place], [place] [address]      Provider Number: [id number]  Attending Physician Name and Address:  Patrecia Pour, MD  Relative Name and Phone Number:  Kamilla Hands,    [phone]    Current Level of Care: Hospital Recommended Level of Care: [place] Prior Approval Number:    Date Approved/Denied:   PASRR Number: [id number]  Discharge Plan: SNF    Current Diagnoses: Patient Active Problem List   Diagnosis Date Noted   General weakness    Skin ulcer of sacrum ([name])    Metabolic acidosis 38/45/3646   Normocytic anemia 12/11/2021   Thrombocytopenia ([place]) 12/11/2021   Memory deficit 80/32/1224   Acute metabolic encephalopathy 82/50/0370   Syncope and collapse 12/09/2021   Stroke-like symptom 12/09/2021   SIRS (systemic inflammatory response syndrome) ([place]) 12/09/2021   Orthostatic hypotension 12/09/2021   Leg ulcer, left ([place]) 10/13/2021   Eye tearing, left 02/19/2020   TMJ syndrome, left 02/19/2020   URI (upper respiratory infection) 01/24/2020   Lump in neck 06/08/2018   Sleep difficulties 03/31/2018   Persistent atrial fibrillation ([place]) 09/06/2017   TIA (transient ischemic attack) 07/12/2017   Bradycardia 04/12/2017   Depression 01/29/2017   Fall    Aphasia as late effect of cerebrovascular accident (CVA)    Acute renal failure ([place]) 01/11/2017   Chronic diastolic heart failure ([name])    Dysarthria    Atrial flutter ([place]) 12/22/2016   Spastic hemiplegia of right dominant side as late effect of cerebral infarction ([place])    Anemia of chronic disease    Hypertension    Dysphagia, post-stroke    Anterior cerebral circulation hemorrhagic  infarction ([place]) 12/03/2016   Right hemiparesis ([place])    Nontraumatic subcortical hemorrhage of left cerebral hemisphere ([name])    Mild aortic regurgitation 08/20/2016   Bilateral edema of lower extremity 04/07/2016   Pancreatic duct dilated 01/09/2016   Stage 4 chronic kidney disease ([place]) 12/25/2015   Mild tricuspid regurgitation 12/25/2015   Mild mitral regurgitation 12/25/2015   Diabetes mellitus without complication ([place]) 48/88/9169   Abdominal pain, chronic, right upper quadrant 06/04/2015   Chronic low back pain    Pain in thoracic spine 04/18/2010   Coronary atherosclerosis 01/24/2009   ARTHRITIS, LEFT KNEE 12/13/2008   Hypothyroidism 11/21/2007   Hyperlipidemia 11/09/2007   Osteopenia 11/09/2007    Orientation RESPIRATION BLADDER Height & Weight     Self, Place  Normal Incontinent, External catheter Weight: 167 lb 12.3 oz (76.1 kg) Height:  '5\' 6"'$  (167.6 cm)  BEHAVIORAL SYMPTOMS/MOOD NEUROLOGICAL BOWEL NUTRITION STATUS      Continent Diet (See DC summary)  AMBULATORY STATUS COMMUNICATION OF NEEDS Skin   Extensive Assist Verbally Skin abrasions (R buttock skin tear)                       Personal Care Assistance Level of Assistance  Bathing, Feeding, Dressing Bathing Assistance: Maximum assistance Feeding assistance: Limited assistance Dressing Assistance: Maximum assistance     Functional Limitations Info  Sight, Hearing, Speech Sight Info: Adequate Hearing Info: Adequate Speech Info: Impaired    SPECIAL CARE FACTORS FREQUENCY  PT (By licensed PT), OT (By licensed OT)  PT Frequency: 5x week OT Frequency: 5x week            Contractures Contractures Info: Not present    Additional Factors Info  Code Status, Allergies, Psychotropic Code Status Info: DNR Allergies Info: Shrimp (Shellfish Allergy)   Valsartan   Tandem Plus (Fefum-fepo-fa-b Cmp-c-zn-mn-cu)   Ivp Dye (Iodinated Contrast Media)   Penicillins Psychotropic Info: Fluoxetine          Current Medications (12/16/2021):  This is the current hospital active medication list Current Facility-Administered Medications  Medication Dose Route Frequency Provider Last Rate Last Admin   acetaminophen (TYLENOL) tablet 650 mg  650 mg Oral Q6H PRN Howerter, Justin B, DO   650 mg at 12/13/21 2124   Or   acetaminophen (TYLENOL) suppository 650 mg  650 mg Rectal Q6H PRN Howerter, Justin B, DO       ALPRAZolam Duanne Moron) tablet 0.25 mg  0.25 mg Oral BID PRN Rise Patience, MD   0.25 mg at 12/13/21 2124   apixaban (ELIQUIS) tablet 5 mg  5 mg Oral BID Kc, Maren Beach, MD   5 mg at 12/16/21 0930   atorvastatin (LIPITOR) tablet 20 mg  20 mg Oral Daily Howerter, Justin B, DO   20 mg at 12/16/21 0929   FLUoxetine (PROZAC) capsule 40 mg  40 mg Oral Daily Irene Pap N, DO   40 mg at 12/16/21 0930   levothyroxine (SYNTHROID) tablet 75 mcg  75 mcg Oral Q0600 Howerter, Justin B, DO   75 mcg at 12/16/21 0551   metoprolol tartrate (LOPRESSOR) injection 2.5 mg  2.5 mg Intravenous Q6H PRN Kc, Maren Beach, MD       metoprolol tartrate (LOPRESSOR) tablet 25 mg  25 mg Oral BID Vance Gather B, MD   25 mg at 12/16/21 0930   mupirocin ointment (BACTROBAN) 2 % 1 application.  1 application. Topical BID Howerter, Justin B, DO   1 application. at 12/16/21 1012   nortriptyline (PAMELOR) capsule 40 mg  40 mg Oral QHS Hall, Archie Patten N, DO   40 mg at 12/15/21 2040   polyethylene glycol (MIRALAX / GLYCOLAX) packet 17 g  17 g Oral Daily Patrecia Pour, MD   17 g at 12/16/21 1012   senna-docusate (Senokot-S) tablet 2 tablet  2 tablet Oral BID Patrecia Pour, MD   2 tablet at 12/16/21 0930   tamsulosin (FLOMAX) capsule 0.4 mg  0.4 mg Oral Daily Patrecia Pour, MD       torsemide [facility]) tablet 20 mg  20 mg Oral Daily Patrecia Pour, MD   20 mg at 12/16/21 0930     Discharge Medications: Please see discharge summary for a list of discharge medications.  Relevant Imaging Results:  Relevant Lab Results:   Additional  Information SS#: [id number]  [place], Stanley

## 2021-12-16 NOTE — Progress Notes (Signed)
Speech Language Pathology Treatment: Dysphagia  Patient Details Name: Valerie Mcclure MRN: [id number] DOB: 1940-09-26 Today's Date: 12/16/2021 Time: 9191-6606 SLP Time Calculation (min) (ACUTE ONLY): 8 min  Assessment / Plan / Recommendation Clinical Impression  Follow up after pt's MBS last week with family member at bedside. She denies recent s/s aspiration and discussed her esophageal involvement/stasis reviewing esophageal precautions. No signs aspiration with thin water with straw or solid texture. Pt and therapist in agreement to upgrade from chopped meats to regular solids. SLP wrote order, continue thin and pills whole in puree, esophageal precautions. No further ST needed.  HPI HPI: Per MD report "Valerie Mcclure is a 81 y.o. female with medical history significant for persistent atrial fibrillation chronically anticoagulated on Eliquis, chronic diastolic heart failure hypertension, hyperlipidemia, chronic sacral wound, acquired hypothyroidism, stage IV CKD with baseline creatinine 1.7-2.0, who is admitted to [facility] on 12/08/2021 with syncope, right facial droop and dysarthria.  MRI negative and CXR negative.  Pt did not pass Yale swallow screen.  Evaluation ordered.  Pt with PMH + for left MCA CVA in 2018.      SLP Plan  All goals met;Discharge SLP treatment due to (comment)      Recommendations for follow up therapy are one component of a multi-disciplinary discharge planning process, led by the attending physician.  Recommendations may be updated based on patient status, additional functional criteria and insurance authorization.    Recommendations  Diet recommendations: Regular;Thin liquid Liquids provided via: Cup;Straw Medication Administration: Whole meds with puree Supervision: Patient able to self feed Compensations: Slow rate;Small sips/bites Postural Changes and/or Swallow Maneuvers: Seated upright 90 degrees;Upright 30-60 min after meal                 Oral Care Recommendations: Oral care BID Follow Up Recommendations: No SLP follow up Assistance recommended at discharge: Intermittent Supervision/Assistance SLP Visit Diagnosis: Dysphagia, oral phase (R13.11) Plan: All goals met;Discharge SLP treatment due to (comment)           Houston Siren  12/16/2021, 11:07 AM

## 2021-12-16 NOTE — Progress Notes (Signed)
Physical Therapy Treatment Patient Details Name: Valerie Mcclure MRN: [id number] DOB: [birth year] Today's Date: 12/16/2021   History of Present Illness The pt is an 81 y/o female admitted 5/22 with cone with syncope  via EMS from home.  MRI negative for acute abnormality.  Found with hypotension, sepsis. PMHx: persistent Afib, chronic dHF, HTN, HLD, chronic sacral wound, CKD stage IV    PT Comments    Pt needed encouragement today, but once agreed, participated well.  Emphasis on transitions, scooting to EOB, sit to stand technique and safety, standing during pericare for over 4 min, 1-2 min respectively and progression of gait into the hall with the RW, needing more pelvic stability assist today.    Recommendations for follow up therapy are one component of a multi-disciplinary discharge planning process, led by the attending physician.  Recommendations may be updated based on patient status, additional functional criteria and insurance authorization.  Follow Up Recommendations  Skilled nursing-short term rehab (<3 hours/day)     Assistance Recommended at Discharge Frequent or constant Supervision/Assistance  Patient can return home with the following A lot of help with bathing/dressing/bathroom;A lot of help with walking and/or transfers;Assistance with cooking/housework;Direct supervision/assist for medications management;Direct supervision/assist for financial management;Assist for transportation;Help with stairs or ramp for entrance   Equipment Recommendations  Rolling walker (2 wheels);BSC/3in1    Recommendations for Other Services       Precautions / Restrictions Precautions Precaution Comments: incontinent of urine with mobility (wears briefs at home), R-sided coordination deficits.     Mobility  Bed Mobility Overal bed mobility: Needs Assistance Bed Mobility: Supine to Sit     Supine to sit: Mod assist     General bed mobility comments: cues for sequencing,  direction, minimal help with BLE's, mod  truncal assist up to midline and assist pivoting.  mod assist to scoot to EOB, TC's to facilitate pt using R UE/hand to assist.    Transfers Overall transfer level: Needs assistance Equipment used: Rolling walker (2 wheels) Transfers: Sit to/from Stand Sit to Stand: Min assist           General transfer comment: cues for hand placement, assist forward, less need for boost.    Ambulation/Gait Ambulation/Gait assistance: Mod assist Gait Distance (Feet): 30 Feet Assistive device: Rolling walker (2 wheels) Gait Pattern/deviations: Step-through pattern, Step-to pattern Gait velocity: decreased Gait velocity interpretation: <1.31 ft/sec, indicative of household ambulator   General Gait Details: pt still showing L hip drop with some trouble clearing L LE with swing through.  Noticeably more right pelvic shift than previously noted necessitating a more focus pelvic facilitation.   Stairs             Wheelchair Mobility    Modified Rankin (Stroke Patients Only)       Balance Overall balance assessment: Needs assistance   Sitting balance-Leahy Scale: Fair       Standing balance-Leahy Scale: Poor Standing balance comment: Stood at EOB for peri-care due stoot for 4 min before the first rest and another >1 min for recheck and dressing change .  dependent on AD or external support.                            Cognition Arousal/Alertness: Awake/alert Behavior During Therapy: WFL for tasks assessed/performed Overall Cognitive Status: History of cognitive impairments - at baseline  General Comments: pt with decreased ability to initate, sequence and follow simple commands. Needs occasional redirection        Exercises      General Comments        Pertinent Vitals/Pain Pain Assessment Pain Assessment: No/denies pain Pain Intervention(s): Monitored during session     Home Living                          Prior Function            PT Goals (current goals can now be found in the care plan section) Acute Rehab PT Goals Patient Stated Goal: pt's dtr wants pt to go straight home after discharge. PT Goal Formulation: With patient Time For Goal Achievement: 12/23/21 Potential to Achieve Goals: Fair Progress towards PT goals: Progressing toward goals    Frequency    Min 3X/week      PT Plan Current plan remains appropriate    Co-evaluation              AM-PAC PT "6 Clicks" Mobility   Outcome Measure  Help needed turning from your back to your side while in a flat bed without using bedrails?: A Lot Help needed moving from lying on your back to sitting on the side of a flat bed without using bedrails?: A Lot Help needed moving to and from a bed to a chair (including a wheelchair)?: A Lot Help needed standing up from a chair using your arms (e.g., wheelchair or bedside chair)?: A Little Help needed to walk in hospital room?: A Lot Help needed climbing 3-5 steps with a railing? : Total 6 Click Score: 12    End of Session   Activity Tolerance: Patient tolerated treatment well;Patient limited by fatigue Patient left: in chair;with call bell/phone within reach;with chair alarm set;with family/visitor present Nurse Communication: Mobility status PT Visit Diagnosis: Unsteadiness on feet (R26.81);Other abnormalities of gait and mobility (R26.89);Other symptoms and signs involving the nervous system (R29.898)     Time: 1607-3710 PT Time Calculation (min) (ACUTE ONLY): 29 min  Charges:  $Gait Training: 8-22 mins $Therapeutic Activity: 8-22 mins                     12/16/2021  Ginger Carne., PT Acute Rehabilitation Services [phone]  (pager) [phone]  (office)   Tessie Fass Kasarah Sitts 12/16/2021, 4:14 PM

## 2021-12-17 DIAGNOSIS — I5032 Chronic diastolic (congestive) heart failure: Secondary | ICD-10-CM | POA: Diagnosis not present

## 2021-12-17 DIAGNOSIS — R531 Weakness: Secondary | ICD-10-CM | POA: Diagnosis not present

## 2021-12-17 DIAGNOSIS — I4891 Unspecified atrial fibrillation: Secondary | ICD-10-CM | POA: Diagnosis not present

## 2021-12-17 DIAGNOSIS — D649 Anemia, unspecified: Secondary | ICD-10-CM | POA: Diagnosis not present

## 2021-12-17 DIAGNOSIS — Z79899 Other long term (current) drug therapy: Secondary | ICD-10-CM | POA: Diagnosis not present

## 2021-12-17 DIAGNOSIS — G9341 Metabolic encephalopathy: Secondary | ICD-10-CM | POA: Diagnosis not present

## 2021-12-17 DIAGNOSIS — R55 Syncope and collapse: Secondary | ICD-10-CM | POA: Diagnosis not present

## 2021-12-17 DIAGNOSIS — R600 Localized edema: Secondary | ICD-10-CM | POA: Diagnosis not present

## 2021-12-17 DIAGNOSIS — R2990 Unspecified symptoms and signs involving the nervous system: Secondary | ICD-10-CM | POA: Diagnosis not present

## 2021-12-17 DIAGNOSIS — D6869 Other thrombophilia: Secondary | ICD-10-CM | POA: Diagnosis not present

## 2021-12-17 DIAGNOSIS — I951 Orthostatic hypotension: Secondary | ICD-10-CM | POA: Diagnosis not present

## 2021-12-17 DIAGNOSIS — R413 Other amnesia: Secondary | ICD-10-CM | POA: Diagnosis not present

## 2021-12-17 DIAGNOSIS — E872 Acidosis, unspecified: Secondary | ICD-10-CM | POA: Diagnosis not present

## 2021-12-17 DIAGNOSIS — R001 Bradycardia, unspecified: Secondary | ICD-10-CM | POA: Diagnosis not present

## 2021-12-17 DIAGNOSIS — M2550 Pain in unspecified joint: Secondary | ICD-10-CM | POA: Diagnosis not present

## 2021-12-17 DIAGNOSIS — I4819 Other persistent atrial fibrillation: Secondary | ICD-10-CM | POA: Diagnosis not present

## 2021-12-17 DIAGNOSIS — G8110 Spastic hemiplegia affecting unspecified side: Secondary | ICD-10-CM | POA: Diagnosis not present

## 2021-12-17 DIAGNOSIS — F32A Depression, unspecified: Secondary | ICD-10-CM | POA: Diagnosis not present

## 2021-12-17 DIAGNOSIS — D696 Thrombocytopenia, unspecified: Secondary | ICD-10-CM | POA: Diagnosis not present

## 2021-12-17 DIAGNOSIS — N179 Acute kidney failure, unspecified: Secondary | ICD-10-CM | POA: Diagnosis not present

## 2021-12-17 DIAGNOSIS — F3289 Other specified depressive episodes: Secondary | ICD-10-CM | POA: Diagnosis not present

## 2021-12-17 DIAGNOSIS — R471 Dysarthria and anarthria: Secondary | ICD-10-CM | POA: Diagnosis not present

## 2021-12-17 DIAGNOSIS — Z7401 Bed confinement status: Secondary | ICD-10-CM | POA: Diagnosis not present

## 2021-12-17 DIAGNOSIS — E785 Hyperlipidemia, unspecified: Secondary | ICD-10-CM | POA: Diagnosis not present

## 2021-12-17 DIAGNOSIS — E039 Hypothyroidism, unspecified: Secondary | ICD-10-CM | POA: Diagnosis not present

## 2021-12-17 DIAGNOSIS — R6510 Systemic inflammatory response syndrome (SIRS) of non-infectious origin without acute organ dysfunction: Secondary | ICD-10-CM | POA: Diagnosis not present

## 2021-12-17 MED ORDER — SENNOSIDES-DOCUSATE SODIUM 8.6-50 MG PO TABS: 2.0000 | ORAL_TABLET | Freq: Two times a day (BID) | ORAL | Status: DC

## 2021-12-17 MED ORDER — TAMSULOSIN HCL 0.4 MG PO CAPS: 0.4000 mg | ORAL_CAPSULE | Freq: Every day | ORAL | Status: DC

## 2021-12-17 NOTE — Discharge Summary (Signed)
Physician Discharge Summary   Patient: Valerie Mcclure MRN: [id number] DOB: February 24, 1941  Admit date:     12/08/2021  Discharge date: 12/17/21  Discharge Physician: Flora Lipps   PCP: [name], MD   Recommendations at discharge:   Follow-up with your primary care provider at the skilled nursing facility in 3 to 5 days.  Check CBC BMP magnesium and LFT in the next visit. Foley catheter has been inserted due to recurrent failure to void.  Has been started on tamsulosin.  Would recommend continuation of Foley catheter for next 3 to 4 days and redo a voiding trial.  If fails again might need urology evaluation as outpatient.  Discharge Diagnoses: Principal Problem:   Syncope and collapse Active Problems:   Orthostatic hypotension   Stroke-like symptom   SIRS (systemic inflammatory response syndrome) ([name])   Bilateral edema of lower extremity   Acute renal failure ([name])   Chronic diastolic heart failure ([name])   Bradycardia   Persistent atrial fibrillation ([name])   Depression   Hypothyroidism   Hyperlipidemia   Dysarthria   Metabolic acidosis   Normocytic anemia   Thrombocytopenia ([name])   Memory deficit   Acute metabolic encephalopathy   General weakness   Skin ulcer of sacrum ([name])  Resolved Problems:   * No resolved hospital problems. *  Hospital Course: 81 year old female with history of persistent A-fib on Eliquis, chronic HFpEF, HTN, hyperlipidemia, hypothyroidism, CKD 4 presented to hospital with unwitnessed syncopal episode with some bradycardia.  In the ED EKG showed rate controlled atrial fibrillation with mild AKI.  CT head was unremarkable but chronic left MCA territory infarct.  Patient was then admitted to hospital for syncope and collapse due to orthostatic hypotension, acute metabolic encephalopathy.  Following conditions were addressed during hospitalization.  Assessment and Plan: * Syncope and collapse Initially was noted to have orthostatic  hypotension which has improved at this time.  Received hydration initially.  Will need physical therapy on discharge.  Acute metabolic encephalopathy suspect delirium.  Has improved at this time.  MRI negative UA negative.  Currently alert awake oriented.  Stroke-like symptom CT head and MRI of the brain with MR angiogram was negative for acute stroke.  2D echocardiogram with EF of 65 to 70%.  Repeat CT head 5/27 without acute findings.  Stroke has been ruled out at this time.  Has improved.  SIRS (systemic inflammatory response syndrome) ([name]) Sepsis ruled out.  Urinalysis and chest x-ray was negative.  Was on Rocephin which has been discontinued.  Acute renal failure on CKD stage IV./ Metabolic acidosis. Baseline serum creatinine around 1.8.  On presentation serum creatinine 2.6 creatinine has improved at this time.  Has required Foley catheter due to difficulty voiding.  Latest creatinine of 1.4.  Acute urinary retention.  Failed voiding trial.  Started on Flomax.  We will continue Foley catheter for next few days and do voiding trial.  If fails might need urology evaluation.  Constipation.  We will continue bowel regimen on discharge.  Bilateral edema of lower extremity Appears to be chronic at this time.  No DVTs on ultrasound.  Torsemide has been initiated.  Persistent atrial fibrillation ([name])/flutter Continue Eliquis.  Thought to be secondary to beta-blocker holding.  Currently normal sinus rhythm.  Chronic diastolic heart failure ([name]) Essential hypertension. Has been restarted on torsemide and metoprolol.  We will discontinue lisinopril and hydralazine on discharge  Depression Cont home Pamelor and Prozac.  Hyperlipidemia Continue statin.  Hypothyroidism Continue Synthroid.  TSH 6.8, free T4 1.09.  Deconditioning debility.  Physical therapy has seen the patient and recommend skilled nursing facility on discharge.   Aortic regurgitation: Moderate by echo.  - Cardiology  follow up.   Consultants: Physical therapy, neurology, palliative care  Procedures performed: None Disposition: Skilled nursing facility.  Spoke with the patient's family at bedside. Diet recommendation:  Discharge Diet Orders (From admission, onward)     Start     Ordered   12/17/21 0000  Diet general        12/17/21 0932           Regular diet DISCHARGE MEDICATION: Allergies as of 12/17/2021       Reactions   Shrimp [shellfish Allergy] Anaphylaxis, Rash   Break outs and swelling of the throat   Valsartan Other (See Comments)   Renal failure   Tandem Plus [fefum-fepo-fa-b Cmp-c-zn-mn-cu] Nausea And Vomiting   Ivp Dye [iodinated Contrast Media] Rash   itching   Penicillins Itching, Rash   Has patient had a PCN reaction causing immediate rash, facial/tongue/throat swelling, SOB or lightheadedness with hypotension: Yes Has patient had a PCN reaction causing severe rash involving mucus membranes or skin necrosis: No Has patient had a PCN reaction that required hospitalization: No Has patient had a PCN reaction occurring within the last 10 years: No If all of the above answers are "NO", then may proceed with Cephalosporin use.        Medication List     STOP taking these medications    benzonatate 100 MG capsule Commonly known as: Tessalon Perles   ciclopirox 0.77 % cream Commonly known as: LOPROX   hydrALAZINE 25 MG tablet Commonly known as: APRESOLINE   HYDROcodone bit-homatropine 5-1.5 MG/5ML syrup Commonly known as: HYCODAN   lisinopril 2.5 MG tablet Commonly known as: ZESTRIL   mupirocin ointment 2 % Commonly known as: BACTROBAN       TAKE these medications    acetaminophen 500 MG tablet Commonly known as: TYLENOL Take 1,000 mg by mouth every 6 (six) hours as needed for moderate pain.   atorvastatin 20 MG tablet Commonly known as: LIPITOR TAKE 1 TABLET(20 MG) BY MOUTH DAILY   CALCIUM-VITAMIN D PO Take 1 tablet by mouth daily.    clonazePAM 0.5 MG tablet Commonly known as: KLONOPIN Take 0.5 tablets (0.25 mg total) by mouth 2 (two) times daily as needed for anxiety.   Eliquis 2.5 MG Tabs tablet Generic drug: apixaban TAKE 1 TABLET(2.5 MG) BY MOUTH TWICE DAILY   FLUoxetine 40 MG capsule Commonly known as: PROZAC TAKE 1 CAPSULE(40 MG) BY MOUTH DAILY   levothyroxine 75 MCG tablet Commonly known as: SYNTHROID TAKE 1 TABLET(75 MCG) BY MOUTH DAILY BEFORE BREAKFAST. FOLLOW-UP   metoprolol tartrate 25 MG tablet Commonly known as: LOPRESSOR TAKE 1 TABLET(25 MG) BY MOUTH TWICE DAILY   nortriptyline 10 MG capsule Commonly known as: PAMELOR TAKE 4 CAPSULES(40 MG) BY MOUTH AT BEDTIME   senna-docusate 8.6-50 MG tablet Commonly known as: Senokot-S Take 2 tablets by mouth 2 (two) times daily.   tamsulosin 0.4 MG Caps capsule Commonly known as: FLOMAX Take 1 capsule (0.4 mg total) by mouth daily.   torsemide 20 MG tablet Commonly known as: DEMADEX TAKE 1 TABLET(20 MG) BY MOUTH DAILY   vitamin B-12 100 MCG tablet Commonly known as: CYANOCOBALAMIN Take 100 mcg by mouth daily.               Durable Medical Equipment  (From admission, onward)  Start     Ordered   12/12/21 1316  For home use only DME 3 n 1  Once        12/12/21 1315   12/12/21 1316  For home use only DME Walker rolling  Once       Question Answer Comment  Walker: With [place] Wheels   Patient needs a walker to treat with the following condition Weakness generalized      12/12/21 1315            Contact information for follow-up providers     Burns, Claudina Lick, MD Follow up.   Specialty: Internal Medicine Contact information: [place] [address] [phone]              Contact information for after-discharge care     Destination     [facility]erred [facility]F .   Service: Skilled Nursing Contact information: [place]  [place] [phone]                    Subjective Today, patient was seen and examined at bedside.  Feels okay.  Has had a bowel movement yesterday.  Nursing staff reports urinary retention.  Discharge Exam: Filed Weights   12/08/21 1600 12/09/21 0129 12/17/21 0500  Weight: 73.4 kg 76.1 kg 76.2 kg      12/17/2021    8:03 AM 12/17/2021    5:00 AM 12/17/2021    4:35 AM  Vitals with BMI  Weight  168 lbs   BMI  27.06   Systolic 237  628  Diastolic 48  54  Pulse 69  75    General:  Average built, not in obvious distress, elderly female, alert awake and communicative HENT:   No scleral pallor or icterus noted. Oral mucosa is moist.  Chest:  Clear breath sounds.  Diminished breath sounds bilaterally. No crackles or wheezes.  CVS: S1 &S2 heard. No murmur.  Regular rate and rhythm. Abdomen: Soft, nontender, nondistended.  Bowel sounds are heard.   Extremities: No cyanosis, clubbing, trace edema peripheral pulses are palpable. Psych: Alert, awake and oriented, normal mood CNS:  No cranial nerve deficits.  Power equal in all extremities.   Skin: Warm and dry.  No rashes noted.  Condition at discharge: good  The results of significant diagnostics from this hospitalization (including imaging, microbiology, ancillary and laboratory) are listed below for reference.   Imaging Studies: CT HEAD WO CONTRAST (5MM)  Result Date: 12/13/2021 CLINICAL DATA:  Mental status change of unknown etiology. EXAM: CT HEAD WITHOUT CONTRAST TECHNIQUE: Contiguous axial images were obtained from the base of the skull through the vertex without intravenous contrast. RADIATION DOSE REDUCTION: This exam was performed according to the departmental dose-optimization program which includes automated exposure control, adjustment of the mA and/or kV according to patient size and/or use of iterative reconstruction technique. COMPARISON:  MR brain 12/08/2021 FINDINGS: Brain: No evidence of acute infarction,  hemorrhage, hydrocephalus, extra-axial collection or mass lesion/mass effect. Again noted are changes secondary to remote left MCA infarct involving the left basal ganglia, left temporal lobe, and left insula. Ex vacuo dilatation of the left lateral ventricle is noted and there is well layering in degeneration in the left cerebral peduncle. Prominence of the sulci and ventricles compatible with brain atrophy. Vascular: No hyperdense vessel or unexpected calcification. Skull: Normal. Negative for fracture or focal lesion. Sinuses/Orbits: No acute finding. Other: None IMPRESSION: 1. No acute intracranial  abnormalities. 2. Chronic left MCA infarct. 3. Chronic small vessel ischemic disease and brain atrophy. Electronically Signed   By: Kerby Moors M.D.   On: 12/13/2021 17:53   MR ANGIO HEAD WO CONTRAST  Result Date: 12/08/2021 CLINICAL DATA:  Right-sided facial droop, weakness, slurred speech, stroke suspected EXAM: MRI HEAD WITHOUT CONTRAST MRA HEAD WITHOUT CONTRAST TECHNIQUE: Multiplanar, multi-echo pulse sequences of the brain and surrounding structures were acquired without intravenous contrast. Angiographic images of the Circle of Willis were acquired using MRA technique without intravenous contrast. COMPARISON:  MRI head 07/08/2019, MRA head 07/13/2017, correlation is also made with CT head 12/08/2021 FINDINGS: MRI HEAD FINDINGS Brain: No restricted diffusion to suggest acute or subacute infarct. No acute hemorrhage, mass, mass effect, or midline shift. No hydrocephalus or extra-axial collection. Redemonstrated sequela of remote left MCA infarct involving the left basal ganglia, left temporal lobe, and left insula, with unchanged ex vacuo dilatation of the left lateral ventricle and associated wallerian degeneration in the left cerebral peduncle. Additional T2 hyperintensity in the pons is favored to be the sequela of chronic small vessel ischemic disease. Vascular: Normal arterial flow voids. Skull and  upper cervical spine: Normal marrow signal. Sinuses/Orbits: No acute finding. Other: Trace fluid in the mastoid air cells. MRA HEAD FINDINGS Evaluation is somewhat limited by motion artifact. Anterior circulation: Both internal carotid arteries are patent to the termini, without significant stenosis. A1 segments patent. Normal anterior communicating artery. Anterior cerebral arteries are patent to their distal aspects, with apparent discontinuity in the mid ACAs likely related to motion. No M1 stenosis or occlusion. Normal MCA bifurcations. Distal MCA branches perfused and symmetric, with evaluation of the most distal aspects limited by motion. Previous suspected M2 stenosis may have been secondary to motion artifact on that study. Posterior circulation: Vertebral arteries patent to the vertebrobasilar junction without stenosis. Posterior inferior cerebral arteries patent bilaterally. Basilar patent to its distal aspect. Superior cerebellar arteries patent bilaterally. Patent right P1. The left P1 is not definitively visualized, with fetal or near fetal origin of the left PCA from a patent left posterior communicating artery. The right posterior communicating artery is not visualized. PCAs perfused to their distal aspects without stenosis. Anatomic variants: Fetal or near fetal origin of the left PCA. IMPRESSION: 1. No acute intracranial process. No evidence of acute or subacute infarct. 2. Evaluation is mildly limited by motion artifact. Within that limitation, no intracranial large vessel occlusion or significant stenosis. Electronically Signed   By: Merilyn Baba M.D.   On: 12/08/2021 23:19   MR BRAIN WO CONTRAST  Result Date: 12/08/2021 CLINICAL DATA:  Right-sided facial droop, weakness, slurred speech, stroke suspected EXAM: MRI HEAD WITHOUT CONTRAST MRA HEAD WITHOUT CONTRAST TECHNIQUE: Multiplanar, multi-echo pulse sequences of the brain and surrounding structures were acquired without intravenous  contrast. Angiographic images of the Circle of Willis were acquired using MRA technique without intravenous contrast. COMPARISON:  MRI head 07/08/2019, MRA head 07/13/2017, correlation is also made with CT head 12/08/2021 FINDINGS: MRI HEAD FINDINGS Brain: No restricted diffusion to suggest acute or subacute infarct. No acute hemorrhage, mass, mass effect, or midline shift. No hydrocephalus or extra-axial collection. Redemonstrated sequela of remote left MCA infarct involving the left basal ganglia, left temporal lobe, and left insula, with unchanged ex vacuo dilatation of the left lateral ventricle and associated wallerian degeneration in the left cerebral peduncle. Additional T2 hyperintensity in the pons is favored to be the sequela of chronic small vessel ischemic disease. Vascular: Normal arterial flow voids. Skull  and upper cervical spine: Normal marrow signal. Sinuses/Orbits: No acute finding. Other: Trace fluid in the mastoid air cells. MRA HEAD FINDINGS Evaluation is somewhat limited by motion artifact. Anterior circulation: Both internal carotid arteries are patent to the termini, without significant stenosis. A1 segments patent. Normal anterior communicating artery. Anterior cerebral arteries are patent to their distal aspects, with apparent discontinuity in the mid ACAs likely related to motion. No M1 stenosis or occlusion. Normal MCA bifurcations. Distal MCA branches perfused and symmetric, with evaluation of the most distal aspects limited by motion. Previous suspected M2 stenosis may have been secondary to motion artifact on that study. Posterior circulation: Vertebral arteries patent to the vertebrobasilar junction without stenosis. Posterior inferior cerebral arteries patent bilaterally. Basilar patent to its distal aspect. Superior cerebellar arteries patent bilaterally. Patent right P1. The left P1 is not definitively visualized, with fetal or near fetal origin of the left PCA from a patent left  posterior communicating artery. The right posterior communicating artery is not visualized. PCAs perfused to their distal aspects without stenosis. Anatomic variants: Fetal or near fetal origin of the left PCA. IMPRESSION: 1. No acute intracranial process. No evidence of acute or subacute infarct. 2. Evaluation is mildly limited by motion artifact. Within that limitation, no intracranial large vessel occlusion or significant stenosis. Electronically Signed   By: Merilyn Baba M.D.   On: 12/08/2021 23:19   DG CHEST PORT 1 VIEW  Result Date: 12/13/2021 CLINICAL DATA:  Shortness of breath. EXAM: PORTABLE CHEST 1 VIEW COMPARISON:  12/08/2021 FINDINGS: 0723 hours. Low IM film. The cardio pericardial silhouette is enlarged. Interval increase in bibasilar atelectasis. No substantial pleural effusion or pulmonary edema. The visualized bony structures of the thorax are unremarkable. Telemetry leads overlie the chest. IMPRESSION: Interval progression of bibasilar atelectasis on this low volume film. Electronically Signed   By: Misty Stanley M.D.   On: 12/13/2021 07:49   DG Chest Portable 1 View  Result Date: 12/08/2021 CLINICAL DATA:  Sepsis EXAM: PORTABLE CHEST 1 VIEW COMPARISON:  11/12/2021 FINDINGS: Transverse diameter of heart is in the upper limits of normal. There are no signs of pulmonary edema or focal pulmonary consolidation. There is interval clearing of small linear densities at the left cardiophrenic angle. There is no significant pleural effusion or pneumothorax. IMPRESSION: No active disease. Electronically Signed   By: Elmer Picker M.D.   On: 12/08/2021 18:09   DG Abd Portable 1V  Result Date: 12/09/2021 CLINICAL DATA:  Abdominal discomfort. EXAM: PORTABLE ABDOMEN - 1 VIEW COMPARISON:  No abnormal abdominal or pelvic calcifications. FINDINGS: Enteric contrast material opacifies nondilated loops of small bowel. The bowel gas pattern appears nonobstructed. Postop change is again noted within  the lower lumbar spine. IMPRESSION: Nonobstructive bowel gas pattern. Electronically Signed   By: Kerby Moors M.D.   On: 12/09/2021 11:49   DG Swallowing Func-Speech Pathology  Result Date: 12/09/2021 Table formatting from the original result was not included. Objective Swallowing Evaluation: Type of Study: MBS-Modified Barium Swallow Study  Patient Details Name: KRYSTALL KRUCKENBERG MRN: [id number] Date of Birth: [birth year] Today's Date: 12/09/2021 Time: SLP Start Time (ACUTE ONLY): 0923 -SLP Stop Time (ACUTE ONLY): 0930 SLP Time Calculation (min) (ACUTE ONLY): 16 min Past Medical History: Past Medical History: Diagnosis Date  Blood transfusion without reported diagnosis   Chronic low back pain   History of echocardiogram   Echo 5/18: EF 55-60, Gr 2 DD, mild MR, mild LAE, PASP 47 // Echo 1/18:  EF 55, mild AI, MAC,  mild MR, mild LAE, trivial pericardial effusion  History of ischemic left MCA stroke 11/2016  left MCA infarct status post mechanical thrombectomy complicated by large left basal ganglia hemorrhage with right shift.  HTN (hypertension)   Hyperlipidemia   Hypothyroidism   Osteopenia   Persistent atrial fibrillation ([name])   CHADS2-VASc=6 (female, age 55, HTN, CVA) // Apixaban // rate control strategy   Sacral wound   chronic sacral wound Past Surgical History: Past Surgical History: Procedure Laterality Date  ABDOMINAL HYSTERECTOMY  1970  CHOLECYSTECTOMY  07/2009  Dr. Rise Patience  COLONOSCOPY  2003  FLEXIBLE SIGMOIDOSCOPY  2010  HAND SURGERY    IR ANGIO INTRA EXTRACRAN SEL COM CAROTID INNOMINATE UNI L MOD SED  11/29/2016  IR ANGIO VERTEBRAL SEL SUBCLAVIAN INNOMINATE UNI R MOD SED  11/29/2016  IR PERCUTANEOUS ART THROMBECTOMY/INFUSION INTRACRANIAL INC DIAG ANGIO  11/29/2016  IR RADIOLOGIST EVAL & MGMT  03/24/2017  LUMBAR LAMINECTOMY  11/2008  Done by Dr. Patrice Paradise  RADIOLOGY WITH ANESTHESIA N/A 11/29/2016  Procedure: RADIOLOGY WITH ANESTHESIA;  Surgeon: Radiologist, Medication, MD;  Location: [place];  Service:  Radiology;  Laterality: N/A;  THYROIDECTOMY   HPI: Per MD report "FARHA DANO is a 81 y.o. female with medical history significant for persistent atrial fibrillation chronically anticoagulated on Eliquis, chronic diastolic heart failure hypertension, hyperlipidemia, chronic sacral wound, acquired hypothyroidism, stage IV CKD with baseline creatinine 1.7-2.0, who is [facility] to [facility]21 with syncope, right facial droop and dysarthria.  MRI negative and CXR negative.  Pt did not pass [facility] swallow screen.  Evaluation ordered.  Pt with PMH + for left MCA CVA in 2018.  Subjective: pt awake in chair  Recommendations for follow up therapy are one component of a multi-disciplinary discharge planning process, led by the attending physician.  Recommendations may be updated based on patient status, additional functional criteria and insurance authorization. Assessment / Plan / Recommendation   12/09/2021   9:55 AM Clinical Impressions Clinical Impression Pt presents with mild oropharyngeal dysphagia c/b discoordination with oral transiting due to lingual weakness.  Pharyngeal swallow initiation with liquids was to pyriform sinus contributing to inconsistent laryngeal penetration of thin liquid *and ultra thin barium.  Pt did not cough during MBS as observed during clinical swallow evaluation.  Pharyngeal swallow is strong without retention fortunately.  She did not orally transit barium tablet with thin liquids despite 3 attempts - resulting in premature spillage of liquid into pharynx and penetration.  Pt required applesauce to transit masticated tablet.  Use of straw placed on left side improved oral transiting control.      Chin tuck not tested as not indicated and pt admits to difficulty with head ROM.  Upon esophageal sweep, pt appears with retention of tablet particles - recommend strict esophageal precautions including sitting upright after intake.  Suspect pt has an acute on chronic  dysphagia per her report of occasional cough with po and reflux.  Using teach back with live video, educated pt to recommendations/precautions.  Will follow up x1 due to pt's dysphagia for pt/family education and po tolerance. SLP Visit Diagnosis Dysphagia, oral phase (R13.11) Impact on safety and function Mild aspiration risk     12/09/2021   9:55 AM Treatment Recommendations Treatment Recommendations Therapy as outlined in treatment plan below     12/09/2021   9:56 AM Prognosis Prognosis for Safe Diet Advancement Good   12/09/2021   9:55 AM Diet Recommendations SLP Diet Recommendations Dysphagia 3 (Mech soft) solids;Thin liquid Liquid  Administration via Cup;Straw Medication Administration Whole meds with puree Compensations Slow rate;Small sips/bites Postural Changes Remain semi-upright after after feeds/meals (Comment);Seated upright at 90 degrees     12/09/2021   9:55 AM Other Recommendations Oral Care Recommendations Oral care BID Follow Up Recommendations No SLP follow up Assistance recommended at discharge Intermittent Supervision/Assistance Functional Status Assessment Patient has had a recent decline in their functional status and demonstrates the ability to make significant improvements in function in a reasonable and predictable amount of time.   12/09/2021   9:55 AM Frequency and Duration  Speech Therapy Frequency (ACUTE ONLY) min 1 x/week Treatment Duration 1 week     12/09/2021   9:52 AM Oral Phase Oral Phase Impaired Oral - Nectar Cup Decreased bolus cohesion;Premature spillage Oral - Thin Cup Decreased bolus cohesion;Premature spillage Oral - Thin Straw Decreased bolus cohesion;Premature spillage Oral - Puree WFL Oral - Mech Soft WFL Oral - Pill Decreased bolus cohesion;Delayed oral transit Oral Phase - Comment pt masticated tablet after being unable to transit with liquids x3 attempts, used puree to orally transit    12/09/2021   9:54 AM Pharyngeal Phase Pharyngeal Phase Impaired Pharyngeal- Nectar Cup  Delayed swallow initiation-pyriform sinuses Pharyngeal Material does not enter airway Pharyngeal- Thin Cup Delayed swallow initiation-pyriform sinuses;Penetration/Aspiration during swallow;Pharyngeal residue - pyriform Pharyngeal Material enters airway, remains ABOVE vocal cords and not ejected out Pharyngeal- Thin Straw Delayed swallow initiation-pyriform sinuses;Pharyngeal residue - pyriform Pharyngeal Material enters airway, remains ABOVE vocal cords and not ejected out;Material enters airway, remains ABOVE vocal cords then ejected out Pharyngeal- Puree WFL;Delayed swallow initiation-vallecula Pharyngeal Material does not enter airway Pharyngeal- Mechanical Soft Delayed swallow initiation-vallecula;WFL Pharyngeal Material does not enter airway Pharyngeal- Pill WFL;Delayed swallow initiation-vallecula Pharyngeal Material does not enter airway    12/09/2021   9:55 AM Cervical Esophageal Phase  Cervical Esophageal Phase WFL Kathleen Lime, MS [place] [facility] [phone] Pager [phone] Macario Golds 12/09/2021, 10:01 AM                     VAS [place] ABI WITH/WO TBI  Result Date: 12/12/2021  LOWER EXTREMITY DOPPLER STUDY Patient Name:  ANALILIA GEDDIS  Date of Exam:   12/12/2021 Medical Rec #: [id number]            Accession #:    [id number] Date of Birth: 1940/11/26            Patient Gender: F Patient Age:   72 years Exam Location:  [facility] Procedure:      VAS [place] ABI WITH/WO TBI Referring Phys: PRANAV PATEL --------------------------------------------------------------------------------  Indications: Peripheral artery disease. High Risk Factors: Hyperlipidemia, Diabetes.  Comparison Study: No prior studies. Performing Technologist: Carlos Levering RVT  Examination Guidelines: A complete evaluation includes at minimum, Doppler waveform signals and systolic blood pressure reading at the level of bilateral brachial, anterior tibial, and posterior tibial arteries, when vessel segments  are accessible. Bilateral testing is considered an integral part of a complete examination. Photoelectric Plethysmograph (PPG) waveforms and toe systolic pressure readings are included as required and additional duplex testing as needed. Limited examinations for reoccurring indications may be performed as noted.  ABI Findings: +---------+------------------+-----+---------+--------+ Right    Rt Pressure (mmHg)IndexWaveform Comment  +---------+------------------+-----+---------+--------+ Brachial 162                    triphasic         +---------+------------------+-----+---------+--------+ PTA      185  1.14 biphasic          +---------+------------------+-----+---------+--------+ DP       182               1.12 biphasic          +---------+------------------+-----+---------+--------+ Great Toe147               0.91                   +---------+------------------+-----+---------+--------+ +---------+------------------+-----+---------+-------+ Left     Lt Pressure (mmHg)IndexWaveform Comment +---------+------------------+-----+---------+-------+ Brachial 145                    triphasic        +---------+------------------+-----+---------+-------+ PTA      174               1.07 biphasic         +---------+------------------+-----+---------+-------+ DP       189               1.17 biphasic         +---------+------------------+-----+---------+-------+ Great Toe119               0.73                  +---------+------------------+-----+---------+-------+ +-------+-----------+-----------+------------+------------+ ABI/TBIToday's ABIToday's TBIPrevious ABIPrevious TBI +-------+-----------+-----------+------------+------------+ Right  1.14       0.91                                +-------+-----------+-----------+------------+------------+ Left   1.17       0.73                                 +-------+-----------+-----------+------------+------------+  Summary: Right: Resting right ankle-brachial index is within normal range. No evidence of significant right lower extremity arterial disease. The right toe-brachial index is normal. Left: Resting left ankle-brachial index is within normal range. No evidence of significant left lower extremity arterial disease. The left toe-brachial index is normal. *See table(s) above for measurements and observations.  Electronically signed by Servando Snare MD on 12/12/2021 at 7:02:41 PM.    Final    ECHOCARDIOGRAM COMPLETE  Result Date: 12/09/2021    ECHOCARDIOGRAM REPORT   Patient Name:   [name] Date of Exam: 12/09/2021 Medical Rec #:  [id number]           Height:       66.0 in Accession #:    [id number]          Weight:       167.8 lb Date of Birth:  Dec 04, 1940           BSA:          1.856 m Patient Age:    89 years            BP:           116/64 mmHg Patient Gender: F                   HR:           81 bpm. Exam Location:  Inpatient Procedure: 2D Echo, Cardiac Doppler and Color Doppler Indications:    Syncope  History:        Patient has prior history of Echocardiogram examinations, most  recent 12/02/2016. Stroke, Arrythmias:Atrial Fibrillation; Risk                 Factors:Hypertension and Dyslipidemia.  Sonographer:    [place] Referring Phys: [id number] [place]  1. Left ventricular ejection fraction, by estimation, is 65 to 70%. The left ventricle has normal function. The left ventricle has no regional wall motion abnormalities. Left ventricular diastolic parameters are indeterminate.  2. Right ventricular systolic function is normal. The right ventricular size is mildly enlarged. There is mildly elevated pulmonary artery systolic pressure. The estimated right ventricular systolic pressure is 56.2 mmHg.  3. Left atrial size was mildly dilated.  4. The mitral valve is grossly normal. No evidence of mitral  valve regurgitation.  5. The aortic valve is tricuspid. There is mild thickening of the aortic valve. Aortic valve regurgitation is moderate. No aortic stenosis is present. Aortic regurgitation PHT measures 460 msec.  6. Aortic dilatation noted. There is borderline dilatation of the ascending aorta, measuring 39 mm. Comparison(s): AI has increased from prior report; unable to open 2018 study. FINDINGS  Left Ventricle: Left ventricular ejection fraction, by estimation, is 65 to 70%. The left ventricle has normal function. The left ventricle has no regional wall motion abnormalities. The left ventricular internal cavity size was normal in size. There is  no left ventricular hypertrophy. Left ventricular diastolic parameters are indeterminate. Right Ventricle: The right ventricular size is mildly enlarged. No increase in right ventricular wall thickness. Right ventricular systolic function is normal. There is mildly elevated pulmonary artery systolic pressure. The tricuspid regurgitant velocity is 2.99 m/s, and with an assumed right atrial pressure of 3 mmHg, the estimated right ventricular systolic pressure is 13.0 mmHg. Left Atrium: Left atrial size was mildly dilated. Right Atrium: Right atrial size was normal in size. Pericardium: There is no evidence of pericardial effusion. Mitral Valve: The mitral valve is grossly normal. No evidence of mitral valve regurgitation. Tricuspid Valve: The tricuspid valve is normal in structure. Tricuspid valve regurgitation is mild . No evidence of tricuspid stenosis. Aortic Valve: The aortic valve is tricuspid. There is mild thickening of the aortic valve. Aortic valve regurgitation is moderate. Aortic regurgitation PHT measures 460 msec. No aortic stenosis is present. Aortic valve mean gradient measures 5.5 mmHg. Aortic valve peak gradient measures 12.0 mmHg. Aortic valve area, by VTI measures 1.66 cm. Pulmonic Valve: The pulmonic valve was normal in structure. Pulmonic valve  regurgitation is trivial. No evidence of pulmonic stenosis. Aorta: Aortic dilatation noted. There is borderline dilatation of the ascending aorta, measuring 39 mm. IAS/Shunts: No atrial level shunt detected by color flow Doppler.  LEFT VENTRICLE PLAX 2D LVIDd:         4.00 cm LVIDs:         2.70 cm LV PW:         1.10 cm LV IVS:        1.00 cm LVOT diam:     1.70 cm LV SV:         55 LV SV Index:   30 LVOT Area:     2.27 cm  LV Volumes (MOD) LV vol d, MOD A2C: 60.2 ml LV vol d, MOD A4C: 59.4 ml LV vol s, MOD A2C: 10.6 ml LV vol s, MOD A4C: 23.5 ml LV SV MOD A2C:     49.6 ml LV SV MOD A4C:     59.4 ml LV SV MOD BP:      42.8 ml RIGHT VENTRICLE  RV Basal diam:  4.10 cm RV Mid diam:    2.70 cm RV S prime:     14.70 cm/s TAPSE (M-mode): 2.8 cm LEFT ATRIUM             Index        RIGHT ATRIUM           Index LA diam:        2.80 cm 1.51 cm/m   RA Area:     16.10 cm LA Vol (A2C):   69.0 ml 37.18 ml/m  RA Volume:   36.40 ml  19.61 ml/m LA Vol (A4C):   74.2 ml 39.98 ml/m LA Biplane Vol: 77.8 ml 41.92 ml/m  AORTIC VALVE                     PULMONIC VALVE AV Area (Vmax):    1.57 cm      PV Vmax:          1.16 m/s AV Area (Vmean):   1.68 cm      PV Peak grad:     5.4 mmHg AV Area (VTI):     1.66 cm      PR End Diast Vel: 5.86 msec AV Vmax:           173.00 cm/s AV Vmean:          106.500 cm/s AV VTI:            0.333 m AV Peak Grad:      12.0 mmHg AV Mean Grad:      5.5 mmHg LVOT Vmax:         120.00 cm/s LVOT Vmean:        79.000 cm/s LVOT VTI:          0.244 m LVOT/AV VTI ratio: 0.73 AI PHT:            460 msec  AORTA Ao Root diam: 3.10 cm Ao Asc diam:  3.80 cm MR Peak grad: 59.9 mmHg     TRICUSPID VALVE MR Vmax:      387.00 cm/s   TR Peak grad:   35.8 mmHg MV E velocity: 102.00 cm/s  TR Vmax:        299.00 cm/s                              SHUNTS                             Systemic VTI:  0.24 m                             Systemic Diam: 1.70 cm Rudean Haskell MD Electronically signed by Rudean Haskell  MD Signature Date/Time: 12/09/2021/1:26:47 PM    Final    CT HEAD CODE STROKE WO CONTRAST  Result Date: 12/08/2021 CLINICAL DATA:  Code stroke. Neuro deficit, acute, stroke suspected. Right-sided facial droop, weakness, slurred speech. EXAM: CT HEAD WITHOUT CONTRAST TECHNIQUE: Contiguous axial images were obtained from the base of the skull through the vertex without intravenous contrast. RADIATION DOSE REDUCTION: This exam was performed according to the departmental dose-optimization program which includes automated exposure control, adjustment of the mA and/or kV according to patient size and/or use of iterative reconstruction technique. COMPARISON:  Brain MRI 07/08/2019.  Head CT 07/12/2017. FINDINGS: Brain: No age  advanced or lobar predominant parenchymal atrophy. Redemonstrated chronic left MCA territory infarct within the left temporal lobe, left insula and left basal ganglia. Chronic small vessel ischemic changes within the cerebral white matter, better appreciated on the prior brain MRI of 07/08/2019. There is no acute intracranial hemorrhage. No acute demarcated cortical infarct. No extra-axial fluid collection. No evidence of an intracranial mass. No midline shift. Vascular: No hyperdense vessel.  Atherosclerotic calcifications. Skull: Normal. Negative for fracture or focal lesion. Sinuses/Orbits: No mass or acute finding within the imaged orbits. Minimal frothy secretions within the left maxillary sinus at the imaged levels. ASPECTS [facility] Stroke Program Early CT Score) - Ganglionic level infarction (caudate, lentiform nuclei, internal capsule, insula, M1-M3 cortex): 7 - Supraganglionic infarction (M4-M6 cortex): 3 Total score (0-10 with 10 being normal): 10 (when discounting chronic infarcts). No acute intracranial findings. These results were communicated to Dr. Theda Sers at 4:40 pmon 5/22/2023by text page via the [facility] messaging system. IMPRESSION: No evidence of acute intracranial abnormality.  Redemonstrated chronic left MCA territory infarct within the left temporal lobe, insula and basal ganglia. Chronic small-vessel ischemic changes within the pons, better appreciated on the prior brain MRI of 07/08/2019. Mild left maxillary sinusitis. Electronically Signed   By: Kellie Simmering D.O.   On: 12/08/2021 16:42   VAS US CAROTID  Result Date: 12/09/2021 Carotid Arterial Duplex Study Patient Name:  EMRIE GAYLE  Date of Exam:   12/09/2021 Medical Rec #: [id number]            Accession #:    [id number] Date of Birth: August 23, 1940            Patient Gender: F Patient Age:   67 years Exam Location:  [facility] Procedure:      VAS US CAROTID Referring Phys: Babs Bertin --------------------------------------------------------------------------------  Indications:       TIA. Risk Factors:      Hypertension, hyperlipidemia, no history of smoking, prior                    CVA. Comparison Study:  11-29-2016 CTA Neck showed no evidence of stenosis of right                    carotid and 25% or less of left carotid. Performing Technologist: Darlin Coco RDMS, RVT  Examination Guidelines: A complete evaluation includes B-mode imaging, spectral Doppler, color Doppler, and power Doppler as needed of all accessible portions of each vessel. Bilateral testing is considered an integral part of a complete examination. Limited examinations for reoccurring indications may be performed as noted.  Right Carotid Findings: +----------+-------+-------+--------+------------------------+-----------------+           PSV    EDV    StenosisPlaque Description      Comments                    cm/s   cm/s                                                     +----------+-------+-------+--------+------------------------+-----------------+ CCA Prox  79     9                                                        +----------+-------+-------+--------+------------------------+-----------------+  CCA Distal59      7                                      intimal                                                                   thickening        +----------+-------+-------+--------+------------------------+-----------------+ ICA Prox  62     16     1-39%   focal, smooth and                                                         calcific                                  +----------+-------+-------+--------+------------------------+-----------------+ ICA Distal88     18                                                       +----------+-------+-------+--------+------------------------+-----------------+ ECA       109                                                             +----------+-------+-------+--------+------------------------+-----------------+ +----------+--------+-------+----------------+-------------------+           PSV cm/sEDV cmsDescribe        Arm Pressure (mmHG) +----------+--------+-------+----------------+-------------------+ [id number]            Multiphasic, WNL                    +----------+--------+-------+----------------+-------------------+ +---------+--------+--+--------+-+---------+ VertebralPSV cm/s62EDV cm/s7Antegrade +---------+--------+--+--------+-+---------+  Left Carotid Findings: +----------+--------+--------+--------+-------------------+--------+           PSV cm/sEDV cm/sStenosisPlaque Description Comments +----------+--------+--------+--------+-------------------+--------+ CCA Prox  71      13                                          +----------+--------+--------+--------+-------------------+--------+ CCA Distal64      10                                          +----------+--------+--------+--------+-------------------+--------+ ICA Prox  57      12      1-39%   smooth and calcific         +----------+--------+--------+--------+-------------------+--------+ ICA Distal64      17                                           +----------+--------+--------+--------+-------------------+--------+  ECA       99                                                  +----------+--------+--------+--------+-------------------+--------+ +----------+--------+--------+----------------+-------------------+           PSV cm/sEDV cm/sDescribe        Arm Pressure (mmHG) +----------+--------+--------+----------------+-------------------+ Subclavian171             Multiphasic, WNL                    +----------+--------+--------+----------------+-------------------+ +---------+--------+--+--------+-+---------+ VertebralPSV cm/s67EDV cm/s8Antegrade +---------+--------+--+--------+-+---------+   Summary: Right Carotid: Velocities in the right ICA are consistent with a 1-39% stenosis. Left Carotid: Velocities in the left ICA are consistent with a 1-39% stenosis. Vertebrals:  Bilateral vertebral arteries demonstrate antegrade flow. Subclavians: Normal flow hemodynamics were seen in bilateral subclavian              arteries. *See table(s) above for measurements and observations.  Electronically signed by Servando Snare MD on 12/09/2021 at 5:29:32 PM.    Final    VAS [place] LOWER EXTREMITY VENOUS (DVT)  Result Date: 12/11/2021  Lower Venous DVT Study Patient Name:  NERY KALISZ  Date of Exam:   12/11/2021 Medical Rec #: [id number]            Accession #:    [id number] Date of Birth: 11-11-1940            Patient Gender: F Patient Age:   42 years Exam Location:  [facility] Procedure:      VAS [place] LOWER EXTREMITY VENOUS (DVT) Referring Phys: PRANAV PATEL --------------------------------------------------------------------------------  Indications: Edema.  Risk Factors: None identified. Limitations: Body habitus, poor ultrasound/tissue interface and patient positioning, patient immobility, patient pain tolerance, patient somnolence. Comparison Study: No prior studies. Performing Technologist: Oliver Hum  RVT  Examination Guidelines: A complete evaluation includes B-mode imaging, spectral Doppler, color Doppler, and power Doppler as needed of all accessible portions of each vessel. Bilateral testing is considered an integral part of a complete examination. Limited examinations for reoccurring indications may be performed as noted. The reflux portion of the exam is performed with the patient in reverse Trendelenburg.  +---------+---------------+---------+-----------+----------+-------------------+ RIGHT    CompressibilityPhasicitySpontaneityPropertiesThrombus Aging      +---------+---------------+---------+-----------+----------+-------------------+ CFV      Full           Yes      Yes                                      +---------+---------------+---------+-----------+----------+-------------------+ SFJ      Full                                                             +---------+---------------+---------+-----------+----------+-------------------+ FV Prox  Full                                                             +---------+---------------+---------+-----------+----------+-------------------+  FV Mid   Full                                                             +---------+---------------+---------+-----------+----------+-------------------+ FV Distal               Yes      Yes                                      +---------+---------------+---------+-----------+----------+-------------------+ PFV      Full                                                             +---------+---------------+---------+-----------+----------+-------------------+ POP      Full           Yes      Yes                                      +---------+---------------+---------+-----------+----------+-------------------+ PTV      Full                                                              +---------+---------------+---------+-----------+----------+-------------------+ PERO                                                  Patency shown with                                                        color doppler       +---------+---------------+---------+-----------+----------+-------------------+   +---------+---------------+---------+-----------+----------+--------------+ LEFT     CompressibilityPhasicitySpontaneityPropertiesThrombus Aging +---------+---------------+---------+-----------+----------+--------------+ CFV      Full           Yes      Yes                                 +---------+---------------+---------+-----------+----------+--------------+ SFJ      Full                                                        +---------+---------------+---------+-----------+----------+--------------+ FV Prox  Full                                                        +---------+---------------+---------+-----------+----------+--------------+  FV Mid   Full                                                        +---------+---------------+---------+-----------+----------+--------------+ FV Distal               Yes      Yes                                 +---------+---------------+---------+-----------+----------+--------------+ PFV      Full                                                        +---------+---------------+---------+-----------+----------+--------------+ POP      Full           Yes      Yes                                 +---------+---------------+---------+-----------+----------+--------------+ PTV      Full                                                        +---------+---------------+---------+-----------+----------+--------------+ PERO     Full                                                        +---------+---------------+---------+-----------+----------+--------------+     Summary: RIGHT: - There  is no evidence of deep vein thrombosis in the lower extremity. However, portions of this examination were limited- see technologist comments above.  - No cystic structure found in the popliteal fossa.  LEFT: - There is no evidence of deep vein thrombosis in the lower extremity. However, portions of this examination were limited- see technologist comments above.  - No cystic structure found in the popliteal fossa.  *See table(s) above for measurements and observations. Electronically signed by Jamelle Haring on 12/11/2021 at 4:52:09 PM.    Final     Microbiology: Results for orders placed or performed during the hospital encounter of 12/08/21  Culture, blood (single)     Status: None   Collection Time: 12/08/21  6:05 PM   Specimen: BLOOD  Result Value Ref Range Status   Specimen Description BLOOD RIGHT ANTECUBITAL  Final   Special Requests   Final    BOTTLES DRAWN AEROBIC AND ANAEROBIC Blood Culture results may not be optimal due to an inadequate volume of blood received in culture bottles   Culture   Final    NO GROWTH 5 DAYS Performed at [facility], [place] [address] [place], [place] [address]    Report Status 12/13/2021 FINAL  Final    Labs: CBC: Recent Labs  Lab 12/11/21 0215 12/11/21 1951 12/12/21 8366 12/15/21 0044  WBC 7.4  --  6.7 8.0  HGB 7.7* 8.9* 9.3* 9.2*  HCT 24.2* 27.9* 28.8* 29.0*  MCV 98.8  --  97.0 99.0  PLT 146*  --  140* 182   Basic Metabolic Panel: Recent Labs  Lab 12/11/21 0215 12/12/21 0452 12/13/21 0333 12/14/21 0233 12/15/21 0044 12/16/21 0727  NA 140 140 140 139 141 136  K 3.9 4.0 3.9 3.7 4.1 3.6  CL 118* 116* 113* 112* 111 106  CO2 16* 17* 20* _0 GLUCOSE 141* 95 123* 119* 138* 116*  BUN 26* _1 CREATININE 1.58* 1.38* 1.22* 1.39* 1.42* 1.46*  CALCIUM 8.2* 8.4* 8.6* 8.3* 8.8* 8.2*  MG 1.7 1.7  --   --   --   --    Liver Function Tests: Recent Labs  Lab 12/11/21 0215 12/12/21 0452  AST 24 23  ALT 21 21  ALKPHOS 56  59  BILITOT 0.2* 0.5  PROT 4.7* 5.0*  ALBUMIN 2.4* 2.4*   CBG: No results for input(s): GLUCAP in the last 168 hours.  Discharge time spent: greater than 30 minutes.  Signed: Flora Lipps, MD [facility] 12/17/2021

## 2021-12-17 NOTE — Progress Notes (Signed)
Nursing Discharge Note   Admit Date: 12/08/2021  Discharge date: 12/17/2021   Valerie Mcclure is to be discharged to a [place] per MD order.  AVS completed, placed in discharge packet for facility review. Discharge packet compiled for facility. Non-emergency ambulance transport arranged. Report called to Aileen Pilot LPN at [facility].    Allergies as of 12/17/2021       Reactions   Shrimp [shellfish Allergy] Anaphylaxis, Rash   Break outs and swelling of the throat   Valsartan Other (See Comments)   Renal failure   Tandem Plus [fefum-fepo-fa-b Cmp-c-zn-mn-cu] Nausea And Vomiting   Ivp Dye [iodinated Contrast Media] Rash   itching   Penicillins Itching, Rash   Has patient had a PCN reaction causing immediate rash, facial/tongue/throat swelling, SOB or lightheadedness with hypotension: Yes Has patient had a PCN reaction causing severe rash involving mucus membranes or skin necrosis: No Has patient had a PCN reaction that required hospitalization: No Has patient had a PCN reaction occurring within the last 10 years: No If all of the above answers are "NO", then may proceed with Cephalosporin use.        Medication List     STOP taking these medications    benzonatate 100 MG capsule Commonly known as: Tessalon Perles   ciclopirox 0.77 % cream Commonly known as: LOPROX   hydrALAZINE 25 MG tablet Commonly known as: APRESOLINE   HYDROcodone bit-homatropine 5-1.5 MG/5ML syrup Commonly known as: HYCODAN   lisinopril 2.5 MG tablet Commonly known as: ZESTRIL   mupirocin ointment 2 % Commonly known as: BACTROBAN       TAKE these medications    acetaminophen 500 MG tablet Commonly known as: TYLENOL Take 1,000 mg by mouth every 6 (six) hours as needed for moderate pain.   atorvastatin 20 MG tablet Commonly known as: LIPITOR TAKE 1 TABLET(20 MG) BY MOUTH DAILY   CALCIUM-VITAMIN D PO Take 1 tablet by mouth daily.   clonazePAM 0.5 MG  tablet Commonly known as: KLONOPIN Take 0.5 tablets (0.25 mg total) by mouth 2 (two) times daily as needed for anxiety.   Eliquis 2.5 MG Tabs tablet Generic drug: apixaban TAKE 1 TABLET(2.5 MG) BY MOUTH TWICE DAILY   FLUoxetine 40 MG capsule Commonly known as: PROZAC TAKE 1 CAPSULE(40 MG) BY MOUTH DAILY   levothyroxine 75 MCG tablet Commonly known as: SYNTHROID TAKE 1 TABLET(75 MCG) BY MOUTH DAILY BEFORE BREAKFAST. FOLLOW-UP   metoprolol tartrate 25 MG tablet Commonly known as: LOPRESSOR TAKE 1 TABLET(25 MG) BY MOUTH TWICE DAILY   nortriptyline 10 MG capsule Commonly known as: PAMELOR TAKE 4 CAPSULES(40 MG) BY MOUTH AT BEDTIME   senna-docusate 8.6-50 MG tablet Commonly known as: Senokot-S Take 2 tablets by mouth 2 (two) times daily.   tamsulosin 0.4 MG Caps capsule Commonly known as: FLOMAX Take 1 capsule (0.4 mg total) by mouth daily.   torsemide 20 MG tablet Commonly known as: DEMADEX TAKE 1 TABLET(20 MG) BY MOUTH DAILY   vitamin B-12 100 MCG tablet Commonly known as: CYANOCOBALAMIN Take 100 mcg by mouth daily.               Durable Medical Equipment  (From admission, onward)           Start     Ordered   12/12/21 1316  For home use only DME 3 n 1  Once        12/12/21 1315   12/12/21 1316  For home use only DME  Walker rolling  Once       Question Answer Comment  Walker: With [place]   Patient needs a walker to treat with the following condition Weakness generalized      12/12/21 1315             Discharge Instructions     Call MD for:  persistant nausea and vomiting   Complete by: As directed    Call MD for:  severe uncontrolled pain   Complete by: As directed    Call MD for:  temperature >100.4   Complete by: As directed    Diet general   Complete by: As directed    Discharge instructions   Complete by: As directed    You were found to be significantly dehydrated with impaired kidney function (acute worsening of chronic  kidney disease) which improved with IV fluids. A course of antibiotics was completed while you were here, though no evidence of UTI was found. Your heart rate has normalized and you are stable for discharge with the following recommendations:  - Please HOLD lisinopril and hydralazine (blood pressure medications) until you follow up with either your PCP or your cardiologist.  - Continue taking metoprolol '25mg'$  twice daily as you were.  - Take demadex once daily UNLESS you are not drinking enough fluids. This can worsen dehydration if you are not taking in enough by mouth. - Recommendation from speech therapy is that you eat a pureed diet. - Continue working with home health PT and OT.  - Seek medical attention sooner if your symptoms return   Discharge instructions   Complete by: As directed    Follow-up with your primary care provider at the skilled nursing facility in 3 to 5 days.  Check blood work at that time.   Increase activity slowly   Complete by: As directed    Increase activity slowly   Complete by: As directed    No wound care   Complete by: As directed    No wound care   Complete by: As directed           [facility] to provide transportation to facility for patient. Non-emergency ambulance transport at bedside. Handoff completed with [facility] staff/EMTs.   Patient discharged from hospital unit via stretcher. Stable at time of discharge.

## 2021-12-17 NOTE — Progress Notes (Signed)
Occupational Therapy Treatment Patient Details Name: YAZLEEMAR STRASSNER MRN: [id number] DOB: 11-13-1940 Today's Date: 12/17/2021   History of present illness The pt is an 81 y/o female admitted 5/22 with cone with syncope  via EMS from home.  MRI negative for acute abnormality.  Found with hypotension, sepsis. PMHx: persistent Afib, chronic dHF, HTN, HLD, chronic sacral wound, CKD stage IV   OT comments  Pt supine in bed and agreeable to OT. Completing bed mobility with min assist today, transfers using RW with min assist and mobility to/from sink using RW with mod assist initially fading to min assist. Patient stood at sink to complete oral care with min guard to min assist, posterior bias increased as she fatigued.  Patient demonstrating improved cognition with initiation, sequencing and processing today.  Continues to require step by step cueing during transfers with poor carryover during session.  Agreeable to SNF now with plans to dc to [facility] today.  Will follow acutely.    Recommendations for follow up therapy are one component of a multi-disciplinary discharge planning process, led by the attending physician.  Recommendations may be updated based on patient status, additional functional criteria and insurance authorization.    Follow Up Recommendations  Skilled nursing-short term rehab (<3 hours/day)    Assistance Recommended at Discharge Frequent or constant Supervision/Assistance  Patient can return home with the following  A lot of help with walking and/or transfers;A lot of help with bathing/dressing/bathroom;Assistance with cooking/housework;Direct supervision/assist for medications management;Direct supervision/assist for financial management;Assist for transportation;Help with stairs or ramp for entrance   Equipment Recommendations  BSC/3in1    Recommendations for Other Services      Precautions / Restrictions Precautions Precautions: Fall Precaution Comments: incontinent  of urine with mobility (wears briefs at home), R-sided coordination deficits. Restrictions Weight Bearing Restrictions: No       Mobility Bed Mobility Overal bed mobility: Needs Assistance Bed Mobility: Supine to Sit, Sit to Supine     Supine to sit: Min assist Sit to supine: Min assist   General bed mobility comments: cueing for sequencing, technique and using rail. min assist for trunk support and scooting foward when reaching to pull self up with L UE on rail.    Transfers                         Balance Overall balance assessment: Needs assistance   Sitting balance-Leahy Scale: Fair     Standing balance support: No upper extremity supported, During functional activity Standing balance-Leahy Scale: Poor Standing balance comment: relies on BUE support, able to groom with mina-min guard for balance with 1 UE support and posterior bias                           ADL either performed or assessed with clinical judgement   ADL Overall ADL's : Needs assistance/impaired     Grooming: Min guard;Set up;Wash/dry face;Oral care;Sitting;Standing Grooming Details (indicate cue type and reason): standing at sink with min guard to brush teeth, cueing for upright posture due to posterior bias as fatigues             Lower Body Dressing: Total assistance;Sit to/from stand   Toilet Transfer: Minimal assistance;Moderate assistance;Ambulation;Rolling walker (2 wheels) Toilet Transfer Details (indicate cue type and reason): simulated in room to chair at sink         Functional mobility during ADLs: Moderate assistance;Minimal assistance;Rolling walker (2 wheels);Cueing for  safety;Cueing for sequencing General ADL Comments: functional mobility using RW from EOB to/from sink.  Going to sink requires mod assist for support at pelvis to stay foward but returning back to bed improved to min assist with improved coordination using RW.    Extremity/Trunk Assessment               Vision       Perception     Praxis      Cognition Arousal/Alertness: Awake/alert Behavior During Therapy: WFL for tasks assessed/performed Overall Cognitive Status: History of cognitive impairments - at baseline Area of Impairment: Problem solving, Awareness, Safety/judgement, Following commands, Memory                     Memory: Decreased short-term memory Following Commands: Follows one step commands consistently, Follows one step commands with increased time Safety/Judgement: Decreased awareness of safety, Decreased awareness of deficits Awareness: Emergent Problem Solving: Slow processing, Decreased initiation, Difficulty sequencing, Requires verbal cues General Comments: patient with improved ability to follow commands, sequence tasks today. contineus to require increased time to processing and problem solving with poor recall of techniques during session but overall improving.        Exercises      Shoulder Instructions       General Comments plan to dc to [facility] now    Pertinent Vitals/ Pain       Pain Assessment Pain Assessment: No/denies pain Pain Intervention(s): Monitored during session  Home Living                                          Prior Functioning/Environment              Frequency  Min 2X/week        Progress Toward Goals  OT Goals(current goals can now be found in the care plan section)  Progress towards OT goals: Progressing toward goals  Acute Rehab OT Goals Patient Stated Goal: get to rehab and then home OT Goal Formulation: With patient/family Time For Goal Achievement: 12/23/21 Potential to Achieve Goals: Good  Plan Discharge plan remains appropriate;Frequency remains appropriate    Co-evaluation                 AM-PAC OT "6 Clicks" Daily Activity     Outcome Measure   Help from another person eating meals?: A Little Help from another person taking care of  personal grooming?: A Little Help from another person toileting, which includes using toliet, bedpan, or urinal?: Total Help from another person bathing (including washing, rinsing, drying)?: A Lot Help from another person to put on and taking off regular upper body clothing?: A Little Help from another person to put on and taking off regular lower body clothing?: Total 6 Click Score: 13    End of Session Equipment Utilized During Treatment: Gait belt;Rolling walker (2 wheels)  OT Visit Diagnosis: Other abnormalities of gait and mobility (R26.89);Muscle weakness (generalized) (M62.81);Other symptoms and signs involving cognitive function;History of falling (Z91.81)   Activity Tolerance Patient tolerated treatment well   Patient Left in bed;with call bell/phone within reach;with bed alarm set;with family/visitor present   Nurse Communication Mobility status        Time: 1016-1040 OT Time Calculation (min): 24 min  Charges: OT General Charges $OT Visit: 1 Visit OT Treatments $Self Care/Home Management : 23-37 mins  Jolaine Artist,  OT Acute Rehabilitation Services Office [phone]   Delight Stare 12/17/2021, 10:52 AM

## 2021-12-17 NOTE — Care Management Important Message (Signed)
Important Message  Patient Details  Name: Valerie Mcclure MRN: [id number] Date of Birth: 1941/01/17   Medicare Important Message Given:  Yes     Orbie Pyo 12/17/2021, 2:22 PM

## 2021-12-17 NOTE — Telephone Encounter (Signed)
Spoke with Bubba Hales today and basic questions answered regarding patient's care and compliance with appointments and meds.

## 2021-12-17 NOTE — Progress Notes (Signed)
Received in report from [place] that patient was I/O cath with return of 800 mL urine for bladder scan of 525 mL at 0530.

## 2021-12-17 NOTE — Progress Notes (Signed)
14 Fr. Foley catheter inserted per Dr. Ailene Rud order. Patient tolerated procedure well.

## 2021-12-17 NOTE — TOC Transition Note (Signed)
Transition of Care [facility]) - CM/SW Discharge Note   Patient Details  Name: Valerie Mcclure MRN: [id number] Date of Birth: 1940/08/16  [facility] CM/SW Contact:  Coralee Pesa, [place] Phone Number: 12/17/2021, 12:26 PM   Clinical Narrative:    Pt to be transported to [facility] via [facility]. Nurse to call report to [phone]. Rm# [id number]   Final next level of care: Skilled Nursing Facility Barriers to Discharge: Barriers Resolved   Patient Goals and CMS Choice Patient states their goals for this hospitalization and ongoing recovery are:: Pt disoriented and unable to participate in goal setting. CMS Medicare.gov Compare Post Acute Care list provided to:: Patient Represenative (must comment) Choice offered to / list presented to : [place] / Guardian, Adult Children  Discharge Placement              Patient chooses bed at: [place], [place] Patient to be transferred to facility by: [place] Name of family member notified: [name] [phone] Patient and family notified of of transfer: 12/17/21  Discharge Plan and Services   Discharge Planning Services: CM Consult Post Acute Care Choice: [place]          DME Arranged: 3-N-1, Gilford Rile rolling DME Agency: [employer] Date DME Agency Contacted: 12/12/21     HH Arranged: PT, OT HH Agency: [place] Date [facility] Agency Contacted: 12/14/21 Time [place]: 1301 Representative spoke with at [place]: Cindie  Social Determinants of Health ([place]) Interventions     Readmission Risk Interventions     View : No data to display.

## 2021-12-18 DIAGNOSIS — G8110 Spastic hemiplegia affecting unspecified side: Secondary | ICD-10-CM | POA: Diagnosis not present

## 2021-12-18 DIAGNOSIS — D6869 Other thrombophilia: Secondary | ICD-10-CM | POA: Diagnosis not present

## 2021-12-18 DIAGNOSIS — R55 Syncope and collapse: Secondary | ICD-10-CM | POA: Diagnosis not present

## 2021-12-18 DIAGNOSIS — I4891 Unspecified atrial fibrillation: Secondary | ICD-10-CM | POA: Diagnosis not present

## 2021-12-19 ENCOUNTER — Telehealth: Payer: Self-pay

## 2021-12-19 ENCOUNTER — Ambulatory Visit: Payer: HMO | Admitting: Internal Medicine

## 2021-12-19 NOTE — Telephone Encounter (Signed)
Transition Care Management Unsuccessful Follow-up Telephone Call  Date of discharge and from where:  [facility]  Attempts:  1st Attempt  Reason for unsuccessful TCM follow-up call:  No answer/busy

## 2021-12-19 NOTE — Telephone Encounter (Signed)
Transition Care Management Unsuccessful Follow-up Telephone Call  Date of discharge and from where:  [facility] 12/[facility]/2021  Attempts:  2nd Attempt  Reason for unsuccessful TCM follow-up call:  No answer/busy

## 2021-12-20 DIAGNOSIS — Z79899 Other long term (current) drug therapy: Secondary | ICD-10-CM | POA: Diagnosis not present

## 2021-12-31 ENCOUNTER — Inpatient Hospital Stay (HOSPITAL_COMMUNITY)
Admission: EM | Admit: 2021-12-31 | Discharge: 2022-01-03 | DRG: 312 | Disposition: A | Discharge status: Home Health | Payer: HMO | Attending: Internal Medicine | Admitting: Internal Medicine

## 2021-12-31 ENCOUNTER — Other Ambulatory Visit: Payer: Self-pay

## 2021-12-31 ENCOUNTER — Encounter (HOSPITAL_COMMUNITY): Payer: Self-pay

## 2021-12-31 ENCOUNTER — Emergency Department (HOSPITAL_COMMUNITY): Payer: HMO

## 2021-12-31 DIAGNOSIS — Z8673 Personal history of transient ischemic attack (TIA), and cerebral infarction without residual deficits: Secondary | ICD-10-CM | POA: Diagnosis not present

## 2021-12-31 DIAGNOSIS — Z91041 Radiographic dye allergy status: Secondary | ICD-10-CM

## 2021-12-31 DIAGNOSIS — Z79899 Other long term (current) drug therapy: Secondary | ICD-10-CM

## 2021-12-31 DIAGNOSIS — Z888 Allergy status to other drugs, medicaments and biological substances status: Secondary | ICD-10-CM

## 2021-12-31 DIAGNOSIS — E1122 Type 2 diabetes mellitus with diabetic chronic kidney disease: Secondary | ICD-10-CM | POA: Diagnosis present

## 2021-12-31 DIAGNOSIS — E876 Hypokalemia: Secondary | ICD-10-CM | POA: Diagnosis not present

## 2021-12-31 DIAGNOSIS — Z91013 Allergy to seafood: Secondary | ICD-10-CM | POA: Diagnosis not present

## 2021-12-31 DIAGNOSIS — R55 Syncope and collapse: Secondary | ICD-10-CM | POA: Diagnosis not present

## 2021-12-31 DIAGNOSIS — L89312 Pressure ulcer of right buttock, stage 2: Secondary | ICD-10-CM | POA: Diagnosis present

## 2021-12-31 DIAGNOSIS — E785 Hyperlipidemia, unspecified: Secondary | ICD-10-CM | POA: Diagnosis present

## 2021-12-31 DIAGNOSIS — I4430 Unspecified atrioventricular block: Secondary | ICD-10-CM | POA: Diagnosis not present

## 2021-12-31 DIAGNOSIS — N390 Urinary tract infection, site not specified: Secondary | ICD-10-CM | POA: Diagnosis present

## 2021-12-31 DIAGNOSIS — D638 Anemia in other chronic diseases classified elsewhere: Secondary | ICD-10-CM | POA: Diagnosis not present

## 2021-12-31 DIAGNOSIS — I951 Orthostatic hypotension: Secondary | ICD-10-CM | POA: Diagnosis present

## 2021-12-31 DIAGNOSIS — Z7901 Long term (current) use of anticoagulants: Secondary | ICD-10-CM

## 2021-12-31 DIAGNOSIS — F32A Depression, unspecified: Secondary | ICD-10-CM | POA: Diagnosis present

## 2021-12-31 DIAGNOSIS — D631 Anemia in chronic kidney disease: Secondary | ICD-10-CM | POA: Diagnosis present

## 2021-12-31 DIAGNOSIS — E860 Dehydration: Secondary | ICD-10-CM | POA: Diagnosis present

## 2021-12-31 DIAGNOSIS — E119 Type 2 diabetes mellitus without complications: Secondary | ICD-10-CM

## 2021-12-31 DIAGNOSIS — N179 Acute kidney failure, unspecified: Secondary | ICD-10-CM | POA: Diagnosis present

## 2021-12-31 DIAGNOSIS — G8191 Hemiplegia, unspecified affecting right dominant side: Secondary | ICD-10-CM | POA: Diagnosis present

## 2021-12-31 DIAGNOSIS — T502X5A Adverse effect of carbonic-anhydrase inhibitors, benzothiadiazides and other diuretics, initial encounter: Secondary | ICD-10-CM | POA: Diagnosis present

## 2021-12-31 DIAGNOSIS — Z66 Do not resuscitate: Secondary | ICD-10-CM | POA: Diagnosis present

## 2021-12-31 DIAGNOSIS — I5032 Chronic diastolic (congestive) heart failure: Secondary | ICD-10-CM | POA: Diagnosis present

## 2021-12-31 DIAGNOSIS — Z8249 Family history of ischemic heart disease and other diseases of the circulatory system: Secondary | ICD-10-CM

## 2021-12-31 DIAGNOSIS — I4819 Other persistent atrial fibrillation: Secondary | ICD-10-CM | POA: Diagnosis present

## 2021-12-31 DIAGNOSIS — E039 Hypothyroidism, unspecified: Secondary | ICD-10-CM | POA: Diagnosis present

## 2021-12-31 DIAGNOSIS — W19XXXA Unspecified fall, initial encounter: Secondary | ICD-10-CM | POA: Diagnosis not present

## 2021-12-31 DIAGNOSIS — Z880 Allergy status to penicillin: Secondary | ICD-10-CM

## 2021-12-31 DIAGNOSIS — E7849 Other hyperlipidemia: Secondary | ICD-10-CM

## 2021-12-31 DIAGNOSIS — I4892 Unspecified atrial flutter: Secondary | ICD-10-CM | POA: Diagnosis present

## 2021-12-31 DIAGNOSIS — I130 Hypertensive heart and chronic kidney disease with heart failure and stage 1 through stage 4 chronic kidney disease, or unspecified chronic kidney disease: Secondary | ICD-10-CM | POA: Diagnosis present

## 2021-12-31 DIAGNOSIS — R531 Weakness: Secondary | ICD-10-CM | POA: Diagnosis not present

## 2021-12-31 DIAGNOSIS — N184 Chronic kidney disease, stage 4 (severe): Secondary | ICD-10-CM | POA: Diagnosis present

## 2021-12-31 DIAGNOSIS — F3289 Other specified depressive episodes: Secondary | ICD-10-CM

## 2021-12-31 DIAGNOSIS — G8929 Other chronic pain: Secondary | ICD-10-CM | POA: Diagnosis present

## 2021-12-31 DIAGNOSIS — Z79890 Hormone replacement therapy (postmenopausal): Secondary | ICD-10-CM

## 2021-12-31 DIAGNOSIS — M545 Low back pain, unspecified: Secondary | ICD-10-CM | POA: Diagnosis present

## 2021-12-31 DIAGNOSIS — I959 Hypotension, unspecified: Secondary | ICD-10-CM | POA: Diagnosis not present

## 2021-12-31 LAB — COMPREHENSIVE METABOLIC PANEL
ALT: 12 U/L (ref 0–44)
AST: 20 U/L (ref 15–41)
Albumin: 2.5 g/dL — ABNORMAL LOW (ref 3.5–5.0)
Alkaline Phosphatase: 78 U/L (ref 38–126)
Anion gap: 13 (ref 5–15)
BUN: 24 mg/dL — ABNORMAL HIGH (ref 8–23)
CO2: 22 mmol/L (ref 22–32)
Calcium: 8.2 mg/dL — ABNORMAL LOW (ref 8.9–10.3)
Chloride: 101 mmol/L (ref 98–111)
Creatinine, Ser: 2.16 mg/dL — ABNORMAL HIGH (ref 0.44–1.00)
GFR, Estimated: 22 mL/min — ABNORMAL LOW (ref >60)
Glucose, Bld: 180 mg/dL — ABNORMAL HIGH (ref 70–99)
Potassium: 3.6 mmol/L (ref 3.5–5.1)
Sodium: 136 mmol/L (ref 135–145)
Total Bilirubin: 0.9 mg/dL (ref 0.3–1.2)
Total Protein: 5.8 g/dL — ABNORMAL LOW (ref 6.5–8.1)

## 2021-12-31 LAB — CBC WITH DIFFERENTIAL/PLATELET
Abs Immature Granulocytes: 0.17 10*3/uL — ABNORMAL HIGH (ref 0.00–0.07)
Basophils Absolute: 0 10*3/uL (ref 0.0–0.1)
Basophils Relative: 0 %
Eosinophils Absolute: 0.1 10*3/uL (ref 0.0–0.5)
Eosinophils Relative: 1 %
HCT: 29.6 % — ABNORMAL LOW (ref 36.0–46.0)
Hemoglobin: 9.3 g/dL — ABNORMAL LOW (ref 12.0–15.0)
Immature Granulocytes: 1 %
Lymphocytes Relative: 9 %
Lymphs Abs: 1.1 10*3/uL (ref 0.7–4.0)
MCH: 30.6 pg (ref 26.0–34.0)
MCHC: 31.4 g/dL (ref 30.0–36.0)
MCV: 97.4 fL (ref 80.0–100.0)
Monocytes Absolute: 0.6 10*3/uL (ref 0.1–1.0)
Monocytes Relative: 5 %
Neutro Abs: 9.8 10*3/uL — ABNORMAL HIGH (ref 1.7–7.7)
Neutrophils Relative %: 84 %
Platelets: 304 10*3/uL (ref 150–400)
RBC: 3.04 MIL/uL — ABNORMAL LOW (ref 3.87–5.11)
RDW: 15.5 % (ref 11.5–15.5)
WBC: 11.8 10*3/uL — ABNORMAL HIGH (ref 4.0–10.5)
nRBC: 0 % (ref 0.0–0.2)

## 2021-12-31 LAB — URINALYSIS, ROUTINE W REFLEX MICROSCOPIC
Bacteria, UA: NONE SEEN
Bilirubin Urine: NEGATIVE
Glucose, UA: NEGATIVE mg/dL
Ketones, ur: NEGATIVE mg/dL
Nitrite: POSITIVE — AB
Protein, ur: 100 mg/dL — AB
Specific Gravity, Urine: 1.006 (ref 1.005–1.030)
WBC, UA: >50 WBC/hpf — ABNORMAL HIGH (ref 0–5)
pH: 7 (ref 5.0–8.0)

## 2021-12-31 LAB — CBG MONITORING, ED: Glucose-Capillary: 174 mg/dL — ABNORMAL HIGH (ref 70–99)

## 2021-12-31 LAB — TSH: TSH: 5.183 u[IU]/mL — ABNORMAL HIGH (ref 0.350–4.500)

## 2021-12-31 LAB — MAGNESIUM: Magnesium: 1.8 mg/dL (ref 1.7–2.4)

## 2021-12-31 MED ORDER — SODIUM CHLORIDE 0.9 % IV SOLN: INTRAVENOUS | Status: DC

## 2021-12-31 MED ORDER — SODIUM CHLORIDE 0.9 % IV BOLUS: 1000.0000 mL | Freq: Once | INTRAVENOUS | Status: DC

## 2021-12-31 MED ORDER — ATORVASTATIN CALCIUM 10 MG PO TABS
20.0000 mg | ORAL_TABLET | Freq: Every day | ORAL | Status: DC
  Administered 2022-01-01 – 2022-01-03 (×3): 20 mg via ORAL
  Filled 2021-12-31 (×3): qty 2

## 2021-12-31 MED ORDER — METOPROLOL TARTRATE 25 MG PO TABS
25.0000 mg | ORAL_TABLET | Freq: Two times a day (BID) | ORAL | Status: DC
  Administered 2022-01-01 – 2022-01-03 (×5): 25 mg via ORAL
  Filled 2021-12-31 (×5): qty 1

## 2021-12-31 MED ORDER — SODIUM CHLORIDE 0.9 % IV BOLUS
1000.0000 mL | Freq: Once | INTRAVENOUS | Status: AC
  Administered 2021-12-31: 1000 mL via INTRAVENOUS

## 2021-12-31 MED ORDER — LEVOTHYROXINE SODIUM 75 MCG PO TABS
75.0000 ug | ORAL_TABLET | Freq: Every day | ORAL | Status: DC
  Administered 2022-01-01 – 2022-01-03 (×3): 75 ug via ORAL
  Filled 2021-12-31 (×3): qty 1

## 2021-12-31 MED ORDER — APIXABAN 2.5 MG PO TABS
2.5000 mg | ORAL_TABLET | Freq: Two times a day (BID) | ORAL | Status: DC
  Administered 2022-01-01 – 2022-01-03 (×5): 2.5 mg via ORAL
  Filled 2021-12-31 (×5): qty 1

## 2021-12-31 MED ORDER — ACETAMINOPHEN 325 MG PO TABS
650.0000 mg | ORAL_TABLET | Freq: Four times a day (QID) | ORAL | Status: DC | PRN
  Administered 2022-01-01: 650 mg via ORAL
  Filled 2021-12-31: qty 2

## 2021-12-31 MED ORDER — SODIUM CHLORIDE 0.9% FLUSH
3.0000 mL | Freq: Two times a day (BID) | INTRAVENOUS | Status: DC
  Administered 2022-01-01 – 2022-01-03 (×5): 3 mL via INTRAVENOUS

## 2021-12-31 MED ORDER — POLYETHYLENE GLYCOL 3350 17 G PO PACK: 17.0000 g | PACK | Freq: Every day | ORAL | Status: DC | PRN

## 2021-12-31 NOTE — H&P (Signed)
History and Physical   Valerie Mcclure KDX:[id number] DOB: 05-19-1941 DOA: 12/31/2021  PCP: [name], MD   Patient coming from: Home  Chief Complaint: Syncope and collapse  HPI: Valerie Mcclure is a 81 y.o. female with medical history significant of CKD 4, atrial fibrillation, syncope, CHF, depression, hypothyroidism, hyperlipidemia, diabetes, ischemic stroke, hemorrhagic stroke, hypertension, memory deficits, anemia presenting after episode of syncope and collapse.  Patiently recently admitted for episodes of syncope to 3 weeks ago.  Had neurologic work-up with unrevealing MRI brain and carotid Dopplers.  Also had echocardiogram with EF 65 to 70% and normal diastolic and normal RV function.  Episodes to be the time believed to be due to orthostatics initially torsemide, lisinopril, metoprolol and hydralazine were held during admission.  On discharge hydralazine and lisinopril was discontinued and patient was resumed on torsemide and metoprolol.  Since returning home patient has been frequently dizzy on ambulation.  Today she was sitting on the porch and she got up to go to the bathroom and she passed out.  This was witnessed by family and she was able to be helped down so she did not injure herself.  She denies fevers, chills, chest pain, shortness of breath, abdominal pain, constipation, diarrhea, nausea, vomiting.  ED Course: Vital signs in the ED significant for heart rate in the 70s to 90s, respiratory rate in the teens to 20s.  Lab work-up included CMP with BUN 24 creatinine elevated to 2.14 from baseline of 1.4, glucose 180, calcium 8.2, protein 5.8, albumin 2.5.  CBC with hemoglobin stable at 9.3, mild leukocytosis to 11.8.  TSH elevated at 5.18, urinalysis pending.  Magnesium level normal.  Chest x-ray showing minimal left lung changes favoring atelectasis over infiltration.  Also with significant improvement in aeration and opacity from previous x-ray.  Patient received 3 L  and was started on a rate of fluids in the ED.  Review of Systems: As per HPI otherwise all other systems reviewed and are negative.  Past Medical History:  Diagnosis Date   Blood transfusion without reported diagnosis    Chronic low back pain    History of echocardiogram    Echo 5/18: EF 55-60, Gr 2 DD, mild MR, mild LAE, PASP 47 // Echo 1/18:  EF 55, mild AI, MAC, mild MR, mild LAE, trivial pericardial effusion   History of ischemic left MCA stroke 11/2016   left MCA infarct status post mechanical thrombectomy complicated by large left basal ganglia hemorrhage with right shift.   HTN (hypertension)    Hyperlipidemia    Hypothyroidism    Osteopenia    Persistent atrial fibrillation ([name])    CHADS2-VASc=6 (female, age 26, HTN, CVA) // Apixaban // rate control strategy    Sacral wound    chronic sacral wound    Past Surgical History:  Procedure Laterality Date   ABDOMINAL HYSTERECTOMY  1970   CHOLECYSTECTOMY  07/2009   Dr. Rise Patience   COLONOSCOPY  2003   FLEXIBLE SIGMOIDOSCOPY  2010   HAND SURGERY     IR ANGIO INTRA EXTRACRAN SEL COM CAROTID INNOMINATE UNI L MOD SED  11/29/2016   IR ANGIO VERTEBRAL SEL SUBCLAVIAN INNOMINATE UNI R MOD SED  11/29/2016   IR PERCUTANEOUS ART THROMBECTOMY/INFUSION INTRACRANIAL INC DIAG ANGIO  11/29/2016   IR RADIOLOGIST EVAL & MGMT  03/24/2017   LUMBAR LAMINECTOMY  11/2008   Done by Dr. Patrice Paradise   RADIOLOGY WITH ANESTHESIA N/A 11/29/2016   Procedure: RADIOLOGY WITH ANESTHESIA;  Surgeon: Radiologist, Medication,  MD;  Location: [place];  Service: Radiology;  Laterality: N/A;   THYROIDECTOMY      Social History  reports that she has never smoked. She has never used smokeless tobacco. She reports that she does not drink alcohol and does not use drugs.  Allergies  Allergen Reactions   Shrimp [Shellfish Allergy] Anaphylaxis and Rash    Break outs and swelling of the throat   Valsartan Other (See Comments)    Renal failure   Tandem Plus [Fefum-Fepo-Fa-B  Cmp-C-Zn-Mn-Cu] Nausea And Vomiting   Ivp Dye [Iodinated Contrast Media] Rash    itching   Penicillins Itching and Rash    Has patient had a PCN reaction causing immediate rash, facial/tongue/throat swelling, SOB or lightheadedness with hypotension: Yes Has patient had a PCN reaction causing severe rash involving mucus membranes or skin necrosis: No Has patient had a PCN reaction that required hospitalization: No Has patient had a PCN reaction occurring within the last 10 years: No If all of the above answers are "NO", then may proceed with Cephalosporin use.     Family History  Problem Relation Age of Onset   Heart disease Father 64       MI age 68s   Lung cancer Brother 16   Colon cancer Neg Hx    Esophageal cancer Neg Hx    Rectal cancer Neg Hx    Stomach cancer Neg Hx   Reviewed on admission  Prior to Admission medications   Medication Sig Start Date End Date Taking? Authorizing Provider  acetaminophen (TYLENOL) 500 MG tablet Take 1,000 mg by mouth every 6 (six) hours as needed for moderate pain.    [provider]  atorvastatin (LIPITOR) 20 MG tablet TAKE 1 TABLET(20 MG) BY MOUTH DAILY 04/10/21   [name], MD  CALCIUM-VITAMIN D PO Take 1 tablet by mouth daily.    [provider]  clonazePAM (KLONOPIN) 0.5 MG tablet Take 0.5 tablets (0.25 mg total) by mouth 2 (two) times daily as needed for anxiety. 08/19/21   Burns, Claudina Lick, MD  ELIQUIS 2.5 MG TABS tablet TAKE 1 TABLET(2.5 MG) BY MOUTH TWICE DAILY 11/12/21   [name], MD  FLUoxetine (PROZAC) 40 MG capsule TAKE 1 CAPSULE(40 MG) BY MOUTH DAILY 09/08/21   [name], MD  levothyroxine (SYNTHROID) 75 MCG tablet TAKE 1 TABLET(75 MCG) BY MOUTH DAILY BEFORE BREAKFAST. FOLLOW-UP 09/15/21   [name], MD  metoprolol tartrate (LOPRESSOR) 25 MG tablet TAKE 1 TABLET(25 MG) BY MOUTH TWICE DAILY 12/01/21   [name], MD  nortriptyline (PAMELOR) 10 MG capsule TAKE 4 CAPSULES(40 MG) BY MOUTH AT BEDTIME  10/09/21   Burns, Claudina Lick, MD  senna-docusate (SENOKOT-S) 8.6-50 MG tablet Take 2 tablets by mouth 2 (two) times daily. 12/17/21   Pokhrel, Corrie Mckusick, MD  tamsulosin (FLOMAX) 0.4 MG CAPS capsule Take 1 capsule (0.4 mg total) by mouth daily. 12/17/21   Pokhrel, Corrie Mckusick, MD  torsemide (DEMADEX) 20 MG tablet TAKE 1 TABLET(20 MG) BY MOUTH DAILY 09/08/21   [name], MD  vitamin B-12 (CYANOCOBALAMIN) 100 MCG tablet Take 100 mcg by mouth daily.    [provider]    Physical Exam: Vitals:   12/31/21 2145 12/31/21 2200 12/31/21 2230 12/31/21 2332  BP: (!) 120/54 124/62 136/63 126/73  Pulse: 91 96 92 99  Resp: (!) 22 (!) 22 (!) 21 15  Temp:   98.3 F (36.8 C)   TempSrc:   Oral   SpO2: 97%  97% 97% 95%    Physical Exam Constitutional:      General: She is not in acute distress.    Appearance: Normal appearance.  HENT:     Head: Normocephalic and atraumatic.     Mouth/Throat:     Mouth: Mucous membranes are moist.     Pharynx: Oropharynx is clear.  Eyes:     Extraocular Movements: Extraocular movements intact.     Pupils: Pupils are equal, round, and reactive to light.  Cardiovascular:     Rate and Rhythm: Regular rhythm. Tachycardia present.     Pulses: Normal pulses.     Heart sounds: Normal heart sounds.  Pulmonary:     Effort: Pulmonary effort is normal. No respiratory distress.     Breath sounds: Rales present.  Abdominal:     General: Bowel sounds are normal. There is no distension.     Palpations: Abdomen is soft.     Tenderness: There is no abdominal tenderness.  Musculoskeletal:        General: No swelling or deformity.     Right lower leg: Edema present.     Left lower leg: Edema present.  Skin:    General: Skin is warm and dry.  Neurological:     General: No focal deficit present.     Mental Status: Mental status is at baseline.     Comments: Chronic memory deficits     Labs on Admission: I have personally reviewed following labs and imaging  studies  CBC: Recent Labs  Lab 12/31/21 2001  WBC 11.8*  NEUTROABS 9.8*  HGB 9.3*  HCT 29.6*  MCV 97.4  PLT 323    Basic Metabolic Panel: Recent Labs  Lab 12/31/21 2001  NA 136  K 3.6  CL 101  CO2 22  GLUCOSE 180*  BUN 24*  CREATININE 2.16*  CALCIUM 8.2*  MG 1.8    GFR: CrCl cannot be calculated (Unknown ideal weight.).  Liver Function Tests: Recent Labs  Lab 12/31/21 2001  AST 20  ALT 12  ALKPHOS 78  BILITOT 0.9  PROT 5.8*  ALBUMIN 2.5*    Urine analysis:    Component Value Date/Time   COLORURINE STRAW (A) 12/15/2021 1732   APPEARANCEUR CLEAR 12/15/2021 1732   LABSPEC 1.005 12/15/2021 1732   PHURINE 5.0 12/15/2021 1732   GLUCOSEU NEGATIVE 12/15/2021 1732   GLUCOSEU NEGATIVE 11/18/2010 0859   HGBUR NEGATIVE 12/15/2021 1732   HGBUR negative 12/13/2008 0944   BILIRUBINUR NEGATIVE 12/15/2021 1732   KETONESUR NEGATIVE 12/15/2021 1732   PROTEINUR NEGATIVE 12/15/2021 1732   UROBILINOGEN 0.2 11/18/2010 0859   NITRITE NEGATIVE 12/15/2021 1732   LEUKOCYTESUR NEGATIVE 12/15/2021 1732    Radiological Exams on Admission: DG Chest 2 View  Result Date: 12/31/2021 CLINICAL DATA:  Syncope. EXAM: CHEST - 2 VIEW COMPARISON:  Chest radiograph dated 12/13/2021. FINDINGS: Faint left lung base density, likely atelectasis. Developing infiltrate is not excluded. Trace left pleural effusion may be present. The right lung is clear. No pneumothorax. Stable cardiac silhouette. Atherosclerotic calcification of the aorta. No acute osseous pathology. IMPRESSION: Minimal left lung base atelectasis or less likely infiltrate. Overall significant improvement in the aeration of the lungs compared to prior radiograph and near complete resolution of the previously seen opacities. Electronically Signed   By: Anner Crete M.D.   On: 12/31/2021 20:19    EKG: Independently reviewed.  Sinus rhythm at 81 bpm.  Left bundle branch block.  Left bundle branch block is present on previous  EKG.  Assessment/Plan Principal Problem:   Syncope and collapse Active Problems:   Chronic diastolic heart failure ([name])   Depression   Hypothyroidism   Hyperlipidemia   Diabetes mellitus without complication ([name])   Right hemiparesis ([name])   Anemia of chronic disease   Atrial flutter ([name])   Syncope and collapse > Presenting after episode of syncope collapse after getting up to stand to go to the bathroom this morning. > Had ongoing dizziness on standing after discharge for recent work-up for syncope. > During his admission etiology suspected to be orthostasis.  Had improved during that admission while blood pressure medication and nodal blocking agents were held.  Her torsemide metoprolol and lisinopril were resumed on discharge. > Patient noted to have AKI as well as below.  Symptoms likely due to orthostasis and dehydration. > We will not repeat neurologic work-up nor echocardiogram considering recent work-up and and suspected recurrent orthostasis.  > We will need further medication adjustment likely discontinuation or reduction in diuretic. > Received 3 L of IV fluid and started on a rate of fluids in the ED. - Monitor on telemetry - No further fluids with mild volume overload as below - Supportive care - Up with assistance - PT and OT - Hold home metoprolol, torsemide  AKI on CKD > Creatinine elevated 2.16 from baseline 1.4. > Likely secondary to home blood pressure medication and diuretics  > Electrolytes are stable. Received 3 L of IV fluids in the ED - No further fluids - Trend renal function and electrolytes - Hold home torsemide  CHF > Recent echo showed EF 65-70% with indeterminate diastolic function and normal RV function. > After 3 L IV fluids patient with mild volume overload with trace rales and mild edema lower extremities.  Saturating well. - No further IV fluids, reevaluate in the morning if patient may need a dose of diuresis before medication adjustment.   - Long-term, may not require as high dose of diuretic - I's and O's and daily weights  Atrial fibrillation - Continue home Eliquis - Holding home metoprolol as above  Depression - Continue fluoxetine  Hypothyroidism > TSH remains mildly elevated at 5.18 - Continue home Synthroid  Hyperlipidemia - Continue home atorvastatin  Hypertension - Holding home metoprolol, torsemide as above  History of CVA > Per chart has history of ischemic and hemorrhagic stroke - Continue home atorvastatin, Eliquis  Anemia > Hemoglobin stable at 9.3 - Continue to trend CBC  DVT prophylaxis: Eliquis Code Status:   DNR Family Communication:  Daughter updated at bedside Disposition Plan:   Patient is from:  Home  Anticipated DC to:  Home  Anticipated DC date:  1 to 3 days  Anticipated DC barriers: None  Consults called:  None Admission status:  Observation, telemetry  Severity of Illness: The appropriate patient status for this patient is OBSERVATION. Observation status is judged to be reasonable and necessary in order to provide the required intensity of service to ensure the patient's safety. The patient's presenting symptoms, physical exam findings, and initial radiographic and laboratory data in the context of their medical condition is felt to place them at decreased risk for further clinical deterioration. Furthermore, it is anticipated that the patient will be medically stable for discharge from the hospital within 2 midnights of admission.    Marcelyn Bruins MD [facility]  How to contact the [facility] Attending or Consulting provider [place] or covering provider during after hours [place], for this patient?   Check  the care team in [facility] and look for a) attending/consulting [place] provider listed and b) the [facility] team listed Log into [email or web address] and use [place]'s universal password to access. If you do not have the password, please contact the hospital operator. Locate the [facility]  provider you are looking for under [facility] and page to a number that you can be directly reached. If you still have difficulty reaching the provider, please page the [facility] (Director on Call) for the Hospitalists listed on amion for assistance.  12/31/2021, 11:49 PM

## 2021-12-31 NOTE — ED Provider Notes (Signed)
[facility] EMERGENCY DEPARTMENT Provider Note   CSN: [id number] Arrival date & time: 12/31/21  1951     History  Chief Complaint  Patient presents with   Loss of Consciousness    Valerie Mcclure is a 81 y.o. female.  Pt is an 81 yo female with a pmhx significant for afib (on Eliquis), CHF, HTN, HLD, hypothyroidism, CKD, and syncope.  Pt was admitted from 5/22-31 for syncope.  She had a MRI, MRA, bilateral carotid dopplers, and nothing looked acute.  It was thought to be due to orthostatic syncope.  She was restarted on torsemide and metoprolol and lisinopril and hydralazine was d/c'd.  Daughter said she's been doing this change. Tonight, pt had been sitting outside on her porch.  She got up to walk to the bathroom and passed out.  Family witnessed it and helped her down, so she did not hurt herself.  She feels fine now.  Daughter said she's been having trouble standing and walking since she was d/c.  She's been complaining of feeling dizzy with ambulation.       Home Medications Prior to Admission medications   Medication Sig Start Date End Date Taking? Authorizing Provider  acetaminophen (TYLENOL) 500 MG tablet Take 1,000 mg by mouth every 6 (six) hours as needed for moderate pain.    [provider]  atorvastatin (LIPITOR) 20 MG tablet TAKE 1 TABLET(20 MG) BY MOUTH DAILY 04/10/21   [name], MD  CALCIUM-VITAMIN D PO Take 1 tablet by mouth daily.    [provider]  clonazePAM (KLONOPIN) 0.5 MG tablet Take 0.5 tablets (0.25 mg total) by mouth 2 (two) times daily as needed for anxiety. 08/19/21   Burns, Claudina Lick, MD  ELIQUIS 2.5 MG TABS tablet TAKE 1 TABLET(2.5 MG) BY MOUTH TWICE DAILY 11/12/21   [name], MD  FLUoxetine (PROZAC) 40 MG capsule TAKE 1 CAPSULE(40 MG) BY MOUTH DAILY 09/08/21   [name], MD  levothyroxine (SYNTHROID) 75 MCG tablet TAKE 1 TABLET(75 MCG) BY MOUTH DAILY BEFORE BREAKFAST. FOLLOW-UP 09/15/21   [name], MD  metoprolol tartrate (LOPRESSOR) 25 MG tablet TAKE 1 TABLET(25 MG) BY MOUTH TWICE DAILY 12/01/21   [name], MD  nortriptyline (PAMELOR) 10 MG capsule TAKE 4 CAPSULES(40 MG) BY MOUTH AT BEDTIME 10/09/21   Burns, Claudina Lick, MD  senna-docusate (SENOKOT-S) 8.6-50 MG tablet Take 2 tablets by mouth 2 (two) times daily. 12/17/21   Pokhrel, Corrie Mckusick, MD  tamsulosin (FLOMAX) 0.4 MG CAPS capsule Take 1 capsule (0.4 mg total) by mouth daily. 12/17/21   Pokhrel, Corrie Mckusick, MD  torsemide (DEMADEX) 20 MG tablet TAKE 1 TABLET(20 MG) BY MOUTH DAILY 09/08/21   [name], MD  vitamin B-12 (CYANOCOBALAMIN) 100 MCG tablet Take 100 mcg by mouth daily.    [provider]      Allergies    Shrimp [shellfish allergy], Valsartan, Tandem plus [fefum-fepo-fa-b cmp-c-zn-mn-cu], Ivp dye [iodinated contrast media], and Penicillins    Review of Systems   Review of Systems  Neurological:  Positive for syncope.    Physical Exam Updated Vital Signs BP 126/73 (BP Location: Right Arm)   Pulse 99   Temp 98.3 F (36.8 C) (Oral)   Resp 15   SpO2 95%  Physical Exam Vitals and nursing note reviewed.  Constitutional:      Appearance: Normal appearance.  HENT:     Head: Normocephalic and atraumatic.     Right Ear: External ear  normal.     Left Ear: External ear normal.     Nose: Nose normal.     Mouth/Throat:     Mouth: Mucous membranes are dry.  Eyes:     Extraocular Movements: Extraocular movements intact.     Conjunctiva/sclera: Conjunctivae normal.     Pupils: Pupils are equal, round, and reactive to light.  Cardiovascular:     Rate and Rhythm: Normal rate and regular rhythm.     Pulses: Normal pulses.     Heart sounds: Normal heart sounds.  Pulmonary:     Effort: Pulmonary effort is normal.     Breath sounds: Normal breath sounds.  Abdominal:     General: Abdomen is flat. Bowel sounds are normal.     Palpations: Abdomen is soft.  Musculoskeletal:        General: Normal range of motion.      Cervical back: Normal range of motion and neck supple.  Skin:    General: Skin is warm.     Capillary Refill: Capillary refill takes less than 2 seconds.  Neurological:     General: No focal deficit present.     Mental Status: She is alert and oriented to person, place, and time.  Psychiatric:        Mood and Affect: Mood normal.        Behavior: Behavior normal.     ED Results / Procedures / Treatments   Labs (all labs ordered are listed, but only abnormal results are displayed) Labs Reviewed  CBC WITH DIFFERENTIAL/PLATELET - Abnormal; Notable for the following components:      Result Value   WBC 11.8 (*)    RBC 3.04 (*)    Hemoglobin 9.3 (*)    HCT 29.6 (*)    Neutro Abs 9.8 (*)    Abs Immature Granulocytes 0.17 (*)    All other components within normal limits  COMPREHENSIVE METABOLIC PANEL - Abnormal; Notable for the following components:   Glucose, Bld 180 (*)    BUN 24 (*)    Creatinine, Ser 2.16 (*)    Calcium 8.2 (*)    Total Protein 5.8 (*)    Albumin 2.5 (*)    GFR, Estimated 22 (*)    All other components within normal limits  TSH - Abnormal; Notable for the following components:   TSH 5.183 (*)    All other components within normal limits  CBG MONITORING, ED - Abnormal; Notable for the following components:   Glucose-Capillary 174 (*)    All other components within normal limits  MAGNESIUM  URINALYSIS, ROUTINE W REFLEX MICROSCOPIC    EKG EKG Interpretation  Date/Time:  Wednesday December 31 2021 19:56:39 EDT Ventricular Rate:  81 PR Interval:  251 QRS Duration: 170 QT Interval:  406 QTC Calculation: 472 R Axis:   -55 Text Interpretation: Sinus rhythm Prolonged PR interval Left bundle branch block No significant change since last tracing Confirmed by Isla Pence [phone]) on 12/31/2021 8:31:23 PM  Radiology DG Chest 2 View  Result Date: 12/31/2021 CLINICAL DATA:  Syncope. EXAM: CHEST - 2 VIEW COMPARISON:  Chest radiograph dated 12/13/2021.  FINDINGS: Faint left lung base density, likely atelectasis. Developing infiltrate is not excluded. Trace left pleural effusion may be present. The right lung is clear. No pneumothorax. Stable cardiac silhouette. Atherosclerotic calcification of the aorta. No acute osseous pathology. IMPRESSION: Minimal left lung base atelectasis or less likely infiltrate. Overall significant improvement in the aeration of the lungs compared to prior radiograph and  near complete resolution of the previously seen opacities. Electronically Signed   By: Anner Crete M.D.   On: 12/31/2021 20:19    Procedures Procedures    Medications Ordered in ED Medications  sodium chloride 0.9 % bolus 1,000 mL (0 mLs Intravenous Stopped 12/31/21 2243)    And  0.9 %  sodium chloride infusion ( Intravenous New Bag/Given 12/31/21 2014)  sodium chloride 0.9 % bolus 1,000 mL (0 mLs Intravenous Stopped 12/31/21 2305)  sodium chloride 0.9 % bolus 1,000 mL (0 mLs Intravenous Stopped 12/31/21 2338)    ED Course/ Medical Decision Making/ A&P                           Medical Decision Making Amount and/or Complexity of Data Reviewed Labs: ordered. Radiology: ordered.  Risk Prescription drug management. Decision regarding hospitalization.   This patient presents to the ED for concern of syncope, this involves an extensive number of treatment options, and is a complaint that carries with it a high risk of complications and morbidity.  The differential diagnosis includes cardiac, vasovagal, orthostatic   Co morbidities that complicate the patient evaluation  afib (on Eliquis), CHF, HTN, HLD, hypothyroidism, CKD, and syncope   Additional history obtained:  Additional history obtained from epic  chart review External records from outside source obtained and reviewed including EMS report   Lab Tests:  I Ordered, and personally interpreted labs.  The pertinent results include:  cbc with wbc slightly elevated at 11.8, hgb 9.3  (chronic); cmp with elevation in Cr at 2.16 (cr 1.46 on 5/30)   Imaging Studies ordered:  I ordered imaging studies including CXR and CT head  I independently visualized and interpreted imaging which showed  CXR: IMPRESSION:  Minimal left lung base atelectasis or less likely infiltrate.  Overall significant improvement in the aeration of the lungs  compared to prior radiograph and near complete resolution of the  previously seen opacities.   I agree with the radiologist interpretation   Cardiac Monitoring:  The patient was maintained on a cardiac monitor.  I personally viewed and interpreted the cardiac monitored which showed an underlying rhythm of: nsr   Medicines ordered and prescription drug management:  I ordered medication including IVFs  for orthostatic hypotension  Reevaluation of the patient after these medicines showed that the patient stayed the same I have reviewed the patients home medicines and have made adjustments as needed   Test Considered:  Ct, but pt just had full neurologic work up 2 weeks ago   Critical Interventions:  IVFs   Consultations Obtained:  I requested consultation with the hospitalist (Dr. Trilby Drummer),  and discussed lab and imaging findings as well as pertinent plan - he will admit for obs  Problem List / ED Course:  AKI:  likely due to diuretic restarting.  Pt still unable to sit up or walk.  Additional IVFs ordered     Reevaluation:  After the interventions noted above, I reevaluated the patient and found that they have :improved   Social Determinants of Health:  Lives at home   Dispostion:  After consideration of the diagnostic results and the patients response to treatment, I feel that the patent would benefit from admission.          Final Clinical Impression(s) / ED Diagnoses Final diagnoses:  AKI (acute kidney injury) ([place])  Syncope, unspecified syncope type    Rx / DC Orders ED Discharge Orders  None          Isla Pence, MD 12/31/21 2349

## 2021-12-31 NOTE — ED Notes (Addendum)
Pt stated that she cannot continue with orthostatics, "I feel like my head is going to explode, I don't want to pass out."

## 2021-12-31 NOTE — ED Triage Notes (Signed)
Pt BIB EMS for witness syncopal episode. Pt family stated pt was walking to the bathroom and had syncopal episode. No head injury. Pt was caught by family and was assisted to the ground. Pt reports she was hospitalized for similar episode in the past.

## 2022-01-01 ENCOUNTER — Telehealth: Payer: Self-pay | Admitting: Internal Medicine

## 2022-01-01 DIAGNOSIS — R55 Syncope and collapse: Secondary | ICD-10-CM | POA: Diagnosis not present

## 2022-01-01 LAB — COMPREHENSIVE METABOLIC PANEL
ALT: 10 U/L (ref 0–44)
AST: 12 U/L — ABNORMAL LOW (ref 15–41)
Albumin: 2 g/dL — ABNORMAL LOW (ref 3.5–5.0)
Alkaline Phosphatase: 66 U/L (ref 38–126)
Anion gap: 9 (ref 5–15)
BUN: 22 mg/dL (ref 8–23)
CO2: 22 mmol/L (ref 22–32)
Calcium: 7.2 mg/dL — ABNORMAL LOW (ref 8.9–10.3)
Chloride: 105 mmol/L (ref 98–111)
Creatinine, Ser: 1.98 mg/dL — ABNORMAL HIGH (ref 0.44–1.00)
GFR, Estimated: 25 mL/min — ABNORMAL LOW (ref >60)
Glucose, Bld: 121 mg/dL — ABNORMAL HIGH (ref 70–99)
Potassium: 3.1 mmol/L — ABNORMAL LOW (ref 3.5–5.1)
Sodium: 136 mmol/L (ref 135–145)
Total Bilirubin: 0.6 mg/dL (ref 0.3–1.2)
Total Protein: 4.8 g/dL — ABNORMAL LOW (ref 6.5–8.1)

## 2022-01-01 LAB — CBC
HCT: 25.8 % — ABNORMAL LOW (ref 36.0–46.0)
Hemoglobin: 7.9 g/dL — ABNORMAL LOW (ref 12.0–15.0)
MCH: 29.7 pg (ref 26.0–34.0)
MCHC: 30.6 g/dL (ref 30.0–36.0)
MCV: 97 fL (ref 80.0–100.0)
Platelets: 251 10*3/uL (ref 150–400)
RBC: 2.66 MIL/uL — ABNORMAL LOW (ref 3.87–5.11)
RDW: 15.4 % (ref 11.5–15.5)
WBC: 18.2 10*3/uL — ABNORMAL HIGH (ref 4.0–10.5)
nRBC: 0 % (ref 0.0–0.2)

## 2022-01-01 MED ORDER — FLUOXETINE HCL 20 MG PO CAPS
40.0000 mg | ORAL_CAPSULE | Freq: Every day | ORAL | Status: DC
  Administered 2022-01-01 – 2022-01-03 (×3): 40 mg via ORAL
  Filled 2022-01-01 (×3): qty 2

## 2022-01-01 MED ORDER — SODIUM CHLORIDE 0.9 % IV SOLN
1.0000 g | INTRAVENOUS | Status: DC
  Administered 2022-01-01 – 2022-01-03 (×3): 1 g via INTRAVENOUS
  Filled 2022-01-01 (×3): qty 10

## 2022-01-01 NOTE — Telephone Encounter (Signed)
PT from [facility] called and stated the pt declined physical therapy appointment due to having a conflict with another appointment.

## 2022-01-01 NOTE — ED Notes (Signed)
Delaney Meigs daughter [phone] requesting an update

## 2022-01-01 NOTE — Progress Notes (Signed)
PROGRESS NOTE  Valerie Mcclure  BSW:[id number] DOB: 1941-06-25 DOA: 12/31/2021 PCP: [name], MD   Brief Narrative: Patient is a 81 year old female with history of CKD stage IV, A-fib, CHF, depression, hypothyroidism, hyperlipidemia, diabetes type 2, ischemic/hemorrhagic stroke, hypertension, memory deficits who presented from home with complaint of syncopal, collapse.  She was recently admitted here for the evaluation of syncopal 3 weeks ago, at that time orthostatic hypotension was contributed.  Since returning home, patient was frequently dizzy on ambulation.  On presentation, lab work showed elevated creatinine to 2.1(baseline creatinine of 1.4), leukocytosis.  Patient was started on IV fluids.  Assessment & Plan:  Principal Problem:   Syncope and collapse Active Problems:   Chronic diastolic heart failure ([name])   Depression   Hypothyroidism   Hyperlipidemia   Diabetes mellitus without complication ([name])   Right hemiparesis ([name])   Anemia of chronic disease   Atrial flutter ([name])  Syncope/collapse: Presented with similar symptoms recently and was diagnosed with orthostatic hypotension.  After discharge, patient is still felt dizzy on ambulation.  On multiple antihypertensives at home.  On torsemide, metoprolol, lisinopril at home.  Syncopal is most likely associated with orthostasis, dehydration.  Recently had extensive work-up for syncopal including MRI of the brain, carotid Doppler, echo.  Echo had shown EF of 65 to 70%, indeterminate diastolic function.  We will not repeat this work-up at this admission. PT/OT consulted.  Torsemide, metoprolol on hold IV fluids has been held because she has developed bilateral lower extremity edema.  AKI on CKD stage IIIa: Baseline creatinine of 1.4.  Creatinine elevated to 2.1 on presentation.  Likely from dehydration.  Given IV fluids now on hold.  Home diuretics on hold  Hypokalemia :we will supplement with potassium.  Leukocytosis:  Unclear etiology.  Continue to monitor.  UA was suggestive for urinary tract infection with large leukocytes, WBC more than 50.  Started on ceftriaxone.  Paroxysmal A-fib: Currently in normal sinus rhythm.  On metoprolol for rate control.  On Eliquis for anticoagulation.  Metoprolol on hold  Diastolic congestive heart failure: Last echocardiogram showed EF of 65 to 70%, indeterminate diastolic dysfunction.  Takes torsemide at home, currently on hold, currently on IV fluids  Depression: Fluoxetine  TSH: On Synthyroid.  This is mildly elevated at 5.1  Hyperlipidemia: Continue Lipitor  Hypertension: Monitor blood pressure for now without antihypertensives.  History of CVA: Has history of ischemic/hemorrhagic stroke.  On Lipitor, Eliquis at home  Normocytic anemia: Hemoglobin dropped to 7.9 from 9 most likely from hemodilution from IV fluids.  Continue to monitor           DVT prophylaxis:apixaban (ELIQUIS) tablet 2.5 mg Start: 01/01/22 1000 apixaban (ELIQUIS) tablet 2.5 mg     Code Status: DNR  Family Communication: Daughter at bedside on 6/15  Patient status:Inpatient  Patient is from :Home  Anticipated discharge GY:KZLD  Estimated DC date:1-2 days   Consultants: None  Procedures:None  Antimicrobials:  Anti-infectives (From admission, onward)    None       Subjective:  Patient seen and examined at the bedside this morning in the emergency department.  Daughter at bedside.  Blood pressure was stable during my evaluation, she was sleeping but readily became awake when called her name.  Lying in bed.  Found to have trace bilateral lower extremity edema.    Objective: Vitals:   01/01/22 0600 01/01/22 0700 01/01/22 0720 01/01/22 0730  BP: (!) 108/37 (!) 125/38    Pulse: 80 86 83  82  Resp: (!) '26 16 16 '$ (!) 21  Temp:      TempSrc:      SpO2: 98% 99% 99% 99%    Intake/Output Summary (Last 24 hours) at 01/01/2022 0819 Last data filed at 01/01/2022 0021 Gross  per 24 hour  Intake 3514.58 ml  Output --  Net 3514.58 ml   There were no vitals filed for this visit.  Examination:  General exam: Overall comfortable, not in distress, pleasant elderly female HEENT: PERRL Respiratory system:  no wheezes or crackles  Cardiovascular system: S1 & S2 heard, RRR.  Gastrointestinal system: Abdomen is nondistended, soft and nontender. Central nervous system: Alert and oriented Extremities: 1-2+ bilateral lower extremity pitting edema, no clubbing ,no cyanosis Skin: No rashes, no ulcers,no icterus     Data Reviewed: I have personally reviewed following labs and imaging studies  CBC: Recent Labs  Lab 12/31/21 2001 01/01/22 0515  WBC 11.8* 18.2*  NEUTROABS 9.8*  --   HGB 9.3* 7.9*  HCT 29.6* 25.8*  MCV 97.4 97.0  PLT 304 761   Basic Metabolic Panel: Recent Labs  Lab 12/31/21 2001 01/01/22 0515  NA 136 136  K 3.6 3.1*  CL 101 105  CO2 22 22  GLUCOSE 180* 121*  BUN 24* 22  CREATININE 2.16* 1.98*  CALCIUM 8.2* 7.2*  MG 1.8  --      No results found for this or any previous visit (from the past 240 hour(s)).   Radiology Studies: DG Chest 2 View  Result Date: 12/31/2021 CLINICAL DATA:  Syncope. EXAM: CHEST - 2 VIEW COMPARISON:  Chest radiograph dated 12/13/2021. FINDINGS: Faint left lung base density, likely atelectasis. Developing infiltrate is not excluded. Trace left pleural effusion may be present. The right lung is clear. No pneumothorax. Stable cardiac silhouette. Atherosclerotic calcification of the aorta. No acute osseous pathology. IMPRESSION: Minimal left lung base atelectasis or less likely infiltrate. Overall significant improvement in the aeration of the lungs compared to prior radiograph and near complete resolution of the previously seen opacities. Electronically Signed   By: Anner Crete M.D.   On: 12/31/2021 20:19    Scheduled Meds:  apixaban  2.5 mg Oral BID   atorvastatin  20 mg Oral Daily   FLUoxetine  40 mg Oral  Daily   levothyroxine  75 mcg Oral Q0600   metoprolol tartrate  25 mg Oral BID   sodium chloride flush  3 mL Intravenous Q12H   Continuous Infusions:   LOS: 0 days   Shelly Coss, MD [facility] P6/15/2023, 8:19 AM

## 2022-01-01 NOTE — Evaluation (Signed)
Physical Therapy Evaluation Patient Details Name: Valerie Mcclure MRN: [id number] DOB: [birth year] Today's Date: 01/01/2022  History of Present Illness  Pt is a 81 y/o female presenting to ED on 6/14 after syncopal episode. PMH includes: L ischemia MCA CVA, HTN, osteopenia, afib, sacral wound., memory deficits, anemia  Clinical Impression  Pt presents to PT with decr mobility due to general weakness, decr balance, and residual deficits from old CVA. Pt had just returned home from SNF prior to this admission. Today's eval limited to sitting EOB and semistanding at edge of stretcher. Stretcher too high and pt incontinent of urine preventing attempting further mobility. Took supine (108/41) and sitting (110/60) BP's but unable to get standing BP due to above. Pt denied any dizziness/light headedness. Pt and family feel they can assist pt at home at DC.        Recommendations for follow up therapy are one component of a multi-disciplinary discharge planning process, led by the attending physician.  Recommendations may be updated based on patient status, additional functional criteria and insurance authorization.  Follow Up Recommendations Home health PT    Assistance Recommended at Discharge Frequent or constant Supervision/Assistance  Patient can return home with the following  A lot of help with walking and/or transfers;A lot of help with bathing/dressing/bathroom;Direct supervision/assist for medications management;Assist for transportation;Assistance with cooking/housework;Help with stairs or ramp for entrance    Equipment Recommendations None recommended by PT  Recommendations for Other Services       Functional Status Assessment Patient has had a recent decline in their functional status and demonstrates the ability to make significant improvements in function in a reasonable and predictable amount of time.     Precautions / Restrictions Precautions Precautions: Fall Precaution  Comments: incontinent of urine with mobility (wears briefs at home), R-sided coordination deficits.      Mobility  Bed Mobility Overal bed mobility: Needs Assistance Bed Mobility: Supine to Sit, Sit to Supine, Rolling Rolling: Min assist   Supine to sit: Mod assist, HOB elevated Sit to supine: Mod assist   General bed mobility comments: Assist to bring legs off of bed, elevate trunk into sitting, and bring hips to EOB. Assist to bring legs back up into bed.    Transfers Overall transfer level: Needs assistance Equipment used: Rolling walker (2 wheels) Transfers: Sit to/from Stand Sit to Stand: Mod assist           General transfer comment: Assist to bring hips up and to support RLE. Stretcher high and pt leaning posteriorly onto it for support so she was more propped up then actual standing. Incontinent of urine and had to return to sitting/supine    Ambulation/Gait                  Stairs            Wheelchair Mobility    Modified Rankin (Stroke Patients Only)       Balance Overall balance assessment: Needs assistance Sitting-balance support: No upper extremity supported, Feet unsupported Sitting balance-Leahy Scale: Fair                                       Pertinent Vitals/Pain Pain Assessment Pain Assessment: No/denies pain    Home Living Family/patient expects to be discharged to:: Private residence Living Arrangements: Children Available Help at Discharge: Family;Available 24 hours/day Type of Home: Apartment Home Access: Stairs to  enter Entrance Stairs-Rails: Can reach both;Right;Left Entrance Stairs-Number of Steps: 5   Home Layout: One level Home Equipment: Conservation officer, nature (2 wheels);Wheelchair - manual;Cane - single point      Prior Function Prior Level of Function : Needs assist       Physical Assist : Mobility (physical);ADLs (physical) Mobility (physical): Bed mobility;Transfers;Gait   Mobility Comments:  Family has been assisting with transfers to w/c and short ambulation with walker. Pt had only been home from SNF a few days prior to this encounter       Hand Dominance   Dominant Hand: Right    Extremity/Trunk Assessment   Upper Extremity Assessment Upper Extremity Assessment: Defer to OT evaluation    Lower Extremity Assessment Lower Extremity Assessment: Generalized weakness;RLE deficits/detail RLE Deficits / Details: pt with chronic rt sided weakness due to prior CVA RLE Coordination: decreased gross motor       Communication   Communication: Expressive difficulties (prior CVA)  Cognition Arousal/Alertness: Awake/alert Behavior During Therapy: WFL for tasks assessed/performed Overall Cognitive Status: History of cognitive impairments - at baseline                                          General Comments      Exercises     Assessment/Plan    PT Assessment Patient needs continued PT services  PT Problem List Decreased strength;Decreased activity tolerance;Decreased balance;Decreased mobility       PT Treatment Interventions DME instruction;Gait training;Functional mobility training;Therapeutic activities;Therapeutic exercise;Balance training;Patient/family education    PT Goals (Current goals can be found in the Care Plan section)  Acute Rehab PT Goals Patient Stated Goal: return home PT Goal Formulation: With patient/family Time For Goal Achievement: 01/15/22 Potential to Achieve Goals: Good    Frequency Min 3X/week     Co-evaluation               AM-PAC PT "6 Clicks" Mobility  Outcome Measure Help needed turning from your back to your side while in a flat bed without using bedrails?: A Little Help needed moving from lying on your back to sitting on the side of a flat bed without using bedrails?: A Lot Help needed moving to and from a bed to a chair (including a wheelchair)?: Total Help needed standing up from a chair using your  arms (e.g., wheelchair or bedside chair)?: Total Help needed to walk in hospital room?: Total Help needed climbing 3-5 steps with a railing? : Total 6 Click Score: 9    End of Session   Activity Tolerance: Patient tolerated treatment well Patient left: in bed;with call bell/phone within reach;with family/visitor present Nurse Communication: Mobility status PT Visit Diagnosis: Unsteadiness on feet (R26.81);Other abnormalities of gait and mobility (R26.89);Muscle weakness (generalized) (M62.81)    Time: 2122-4825 PT Time Calculation (min) (ACUTE ONLY): 19 min   Charges:   PT Evaluation $PT Eval Moderate Complexity: 1 [place] [facility] [place] 01/01/2022, 11:16 AM

## 2022-01-02 ENCOUNTER — Inpatient Hospital Stay: Payer: HMO | Admitting: Internal Medicine

## 2022-01-02 DIAGNOSIS — G8929 Other chronic pain: Secondary | ICD-10-CM | POA: Diagnosis present

## 2022-01-02 DIAGNOSIS — R55 Syncope and collapse: Secondary | ICD-10-CM | POA: Diagnosis present

## 2022-01-02 DIAGNOSIS — I4892 Unspecified atrial flutter: Secondary | ICD-10-CM | POA: Diagnosis present

## 2022-01-02 DIAGNOSIS — G8191 Hemiplegia, unspecified affecting right dominant side: Secondary | ICD-10-CM | POA: Diagnosis present

## 2022-01-02 DIAGNOSIS — E1122 Type 2 diabetes mellitus with diabetic chronic kidney disease: Secondary | ICD-10-CM | POA: Diagnosis present

## 2022-01-02 DIAGNOSIS — N179 Acute kidney failure, unspecified: Secondary | ICD-10-CM | POA: Diagnosis present

## 2022-01-02 DIAGNOSIS — L89312 Pressure ulcer of right buttock, stage 2: Secondary | ICD-10-CM | POA: Diagnosis present

## 2022-01-02 DIAGNOSIS — E039 Hypothyroidism, unspecified: Secondary | ICD-10-CM | POA: Diagnosis present

## 2022-01-02 DIAGNOSIS — Z91041 Radiographic dye allergy status: Secondary | ICD-10-CM | POA: Diagnosis not present

## 2022-01-02 DIAGNOSIS — Z66 Do not resuscitate: Secondary | ICD-10-CM | POA: Diagnosis present

## 2022-01-02 DIAGNOSIS — E785 Hyperlipidemia, unspecified: Secondary | ICD-10-CM | POA: Diagnosis present

## 2022-01-02 DIAGNOSIS — N390 Urinary tract infection, site not specified: Secondary | ICD-10-CM | POA: Diagnosis present

## 2022-01-02 DIAGNOSIS — I4819 Other persistent atrial fibrillation: Secondary | ICD-10-CM | POA: Diagnosis present

## 2022-01-02 DIAGNOSIS — Z8673 Personal history of transient ischemic attack (TIA), and cerebral infarction without residual deficits: Secondary | ICD-10-CM | POA: Diagnosis not present

## 2022-01-02 DIAGNOSIS — Z880 Allergy status to penicillin: Secondary | ICD-10-CM | POA: Diagnosis not present

## 2022-01-02 DIAGNOSIS — Z91013 Allergy to seafood: Secondary | ICD-10-CM | POA: Diagnosis not present

## 2022-01-02 DIAGNOSIS — I130 Hypertensive heart and chronic kidney disease with heart failure and stage 1 through stage 4 chronic kidney disease, or unspecified chronic kidney disease: Secondary | ICD-10-CM | POA: Diagnosis present

## 2022-01-02 DIAGNOSIS — E876 Hypokalemia: Secondary | ICD-10-CM | POA: Diagnosis not present

## 2022-01-02 DIAGNOSIS — Z888 Allergy status to other drugs, medicaments and biological substances status: Secondary | ICD-10-CM | POA: Diagnosis not present

## 2022-01-02 DIAGNOSIS — N184 Chronic kidney disease, stage 4 (severe): Secondary | ICD-10-CM | POA: Diagnosis present

## 2022-01-02 DIAGNOSIS — I5032 Chronic diastolic (congestive) heart failure: Secondary | ICD-10-CM | POA: Diagnosis present

## 2022-01-02 DIAGNOSIS — F32A Depression, unspecified: Secondary | ICD-10-CM | POA: Diagnosis present

## 2022-01-02 DIAGNOSIS — I951 Orthostatic hypotension: Secondary | ICD-10-CM | POA: Diagnosis present

## 2022-01-02 DIAGNOSIS — D631 Anemia in chronic kidney disease: Secondary | ICD-10-CM | POA: Diagnosis present

## 2022-01-02 DIAGNOSIS — E860 Dehydration: Secondary | ICD-10-CM | POA: Diagnosis present

## 2022-01-02 LAB — BASIC METABOLIC PANEL
Anion gap: 9 (ref 5–15)
BUN: 20 mg/dL (ref 8–23)
CO2: 22 mmol/L (ref 22–32)
Calcium: 7.5 mg/dL — ABNORMAL LOW (ref 8.9–10.3)
Chloride: 108 mmol/L (ref 98–111)
Creatinine, Ser: 1.78 mg/dL — ABNORMAL HIGH (ref 0.44–1.00)
GFR, Estimated: 28 mL/min — ABNORMAL LOW (ref >60)
Glucose, Bld: 100 mg/dL — ABNORMAL HIGH (ref 70–99)
Potassium: 3 mmol/L — ABNORMAL LOW (ref 3.5–5.1)
Sodium: 139 mmol/L (ref 135–145)

## 2022-01-02 LAB — CBC
HCT: 24.2 % — ABNORMAL LOW (ref 36.0–46.0)
Hemoglobin: 7.8 g/dL — ABNORMAL LOW (ref 12.0–15.0)
MCH: 30.7 pg (ref 26.0–34.0)
MCHC: 32.2 g/dL (ref 30.0–36.0)
MCV: 95.3 fL (ref 80.0–100.0)
Platelets: 222 10*3/uL (ref 150–400)
RBC: 2.54 MIL/uL — ABNORMAL LOW (ref 3.87–5.11)
RDW: 15.5 % (ref 11.5–15.5)
WBC: 9.1 10*3/uL (ref 4.0–10.5)
nRBC: 0 % (ref 0.0–0.2)

## 2022-01-02 MED ORDER — POTASSIUM CHLORIDE CRYS ER 20 MEQ PO TBCR
40.0000 meq | EXTENDED_RELEASE_TABLET | ORAL | Status: AC
  Administered 2022-01-02 (×2): 40 meq via ORAL
  Filled 2022-01-02 (×2): qty 2

## 2022-01-02 MED ORDER — ENSURE ENLIVE PO LIQD
237.0000 mL | Freq: Two times a day (BID) | ORAL | Status: DC
  Administered 2022-01-03: 237 mL via ORAL

## 2022-01-02 NOTE — Plan of Care (Signed)

## 2022-01-02 NOTE — Progress Notes (Signed)
Physical Therapy Treatment Patient Details Name: Valerie Mcclure MRN: [id number] DOB: 1941/01/06 Today's Date: 01/02/2022   History of Present Illness Pt is a 81 y/o female presenting to ED on 6/14 after syncopal episode. PMH includes: L ischemia MCA CVA, HTN, osteopenia, afib, sacral wound., memory deficits, anemia    PT Comments    Patient progressing well towards physical therapy goals. Patient able to ambulate 15' x 2 with minA and RW with no complaints of dizziness. VSS during session. Able to perform sit to stand x 3-4 with initially modA and progressing to minA. Provided gait belt to daughter for home use. Encouraged continued mobility at home with w/c follow or chairs throughout the house. D/c plan remains appropriate as family wants to take her home.     Recommendations for follow up therapy are one component of a multi-disciplinary discharge planning process, led by the attending physician.  Recommendations may be updated based on patient status, additional functional criteria and insurance authorization.  Follow Up Recommendations  Home health PT     Assistance Recommended at Discharge Frequent or constant Supervision/Assistance  Patient can return home with the following A lot of help with walking and/or transfers;A lot of help with bathing/dressing/bathroom;Direct supervision/assist for medications management;Assist for transportation;Assistance with cooking/housework;Help with stairs or ramp for entrance   Equipment Recommendations  None recommended by PT    Recommendations for Other Services       Precautions / Restrictions Precautions Precautions: Fall Precaution Comments: incontinent of urine with mobility (wears briefs at home), R-sided coordination deficits. Restrictions Weight Bearing Restrictions: No     Mobility  Bed Mobility               General bed mobility comments: in recliner on arrival    Transfers Overall transfer level: Needs  assistance Equipment used: Rolling Meera Vasco (2 wheels) Transfers: Sit to/from Stand Sit to Stand: Mod assist, Min assist           General transfer comment: initially requiring modA to stand from recliner with cues for hand placement. Improved extensor tone with patient able to flex hips to sit in chair. On 2nd and 3rd attempt to stand, only required minA to steady    Ambulation/Gait Ambulation/Gait assistance: Min assist Gait Distance (Feet): 15 Feet (+15') Assistive device: Rolling Tyaisha Cullom (2 wheels) Gait Pattern/deviations: Step-through pattern, Decreased stride length, Decreased weight shift to right, Narrow base of support Gait velocity: decreased     General Gait Details: minA for balance and minimal Rw management. Cues for navigation but fair activity pacing   Stairs             Wheelchair Mobility    Modified Rankin (Stroke Patients Only)       Balance Overall balance assessment: Needs assistance Sitting-balance support: No upper extremity supported, Feet unsupported Sitting balance-Leahy Scale: Fair     Standing balance support: During functional activity, Reliant on assistive device for balance Standing balance-Leahy Scale: Poor Standing balance comment: B UE support                            Cognition Arousal/Alertness: Awake/alert Behavior During Therapy: WFL for tasks assessed/performed Overall Cognitive Status: History of cognitive impairments - at baseline Area of Impairment: Problem solving, Awareness, Safety/judgement, Following commands, Memory                 Orientation Level: Disoriented to, Situation, Time Current Attention Level: Sustained Memory: Decreased short-term memory  Following Commands: Follows one step commands consistently, Follows multi-step commands with increased time Safety/Judgement: Decreased awareness of safety Awareness: Emergent Problem Solving: Requires verbal cues, Requires tactile cues,  Difficulty sequencing, Slow processing General Comments: hx of L CVA with cognitive deficits. Inconsistent answers        Exercises General Exercises - Lower Extremity Long Arc Quad: Both, 10 reps, Seated    General Comments        Pertinent Vitals/Pain Pain Assessment Pain Assessment: No/denies pain    Home Living Family/patient expects to be discharged to:: Private residence Living Arrangements: Children Available Help at Discharge: Family;Available 24 hours/day Type of Home: Mobile home Home Access: Stairs to enter Entrance Stairs-Rails: Can reach both;Right;Left Entrance Stairs-Number of Steps: 5   Home Layout: One level Home Equipment: Conservation officer, nature (2 wheels);Wheelchair - manual;Cane - single point;BSC/3in1      Prior Function            PT Goals (current goals can now be found in the care plan section) Acute Rehab PT Goals Patient Stated Goal: return home PT Goal Formulation: With patient/family Time For Goal Achievement: 01/15/22 Potential to Achieve Goals: Good Progress towards PT goals: Progressing toward goals    Frequency    Min 3X/week      PT Plan Current plan remains appropriate    Co-evaluation              AM-PAC PT "6 Clicks" Mobility   Outcome Measure  Help needed turning from your back to your side while in a flat bed without using bedrails?: A Little Help needed moving from lying on your back to sitting on the side of a flat bed without using bedrails?: A Little Help needed moving to and from a bed to a chair (including a wheelchair)?: A Lot Help needed standing up from a chair using your arms (e.g., wheelchair or bedside chair)?: A Lot Help needed to walk in hospital room?: A Lot Help needed climbing 3-5 steps with a railing? : Total 6 Click Score: 13    End of Session Equipment Utilized During Treatment: Gait belt Activity Tolerance: Patient tolerated treatment well Patient left: in chair;with call bell/phone within  reach;with family/visitor present Nurse Communication: Mobility status PT Visit Diagnosis: Unsteadiness on feet (R26.81);Other abnormalities of gait and mobility (R26.89);Muscle weakness (generalized) (M62.81)     Time: 3704-8889 PT Time Calculation (min) (ACUTE ONLY): 27 min  Charges:  $Gait Training: 8-22 mins $Therapeutic Exercise: 8-22 mins                     Jeremias Broyhill A. Gilford Rile PT, DPT Acute Rehabilitation Services Office [phone]    Linna Hoff 01/02/2022, 12:53 PM

## 2022-01-02 NOTE — Progress Notes (Signed)
PROGRESS NOTE  Valerie Mcclure  KCL:[id number] DOB: September 09, 1940 DOA: 12/31/2021 PCP: [name], MD   Brief Narrative: Patient is a 81 year old female with history of CKD stage IV, A-fib, CHF, depression, hypothyroidism, hyperlipidemia, diabetes type 2, ischemic/hemorrhagic stroke, hypertension, memory deficits who presented from home with complaint of syncopal, collapse.  She was recently admitted here for the evaluation of syncopal 3 weeks ago, at that time orthostatic hypotension was contributed.  Since returning home, patient was frequently dizzy on ambulation.  On presentation, lab work showed elevated creatinine to 2.1(baseline creatinine of 1.4), leukocytosis.  Patient was started on IV fluids,now stopped.  Kidney function improving.  We will plan to discharge her tomorrow if remains medically stable, we are waiting for PT evaluation with orthostatic vitals today.  Assessment & Plan:  Principal Problem:   Syncope and collapse Active Problems:   Chronic diastolic heart failure ([name])   Depression   Hypothyroidism   Hyperlipidemia   Diabetes mellitus without complication ([name])   Right hemiparesis ([name])   Anemia of chronic disease   Atrial flutter ([name])   Syncope  Syncope/collapse: Presented with similar symptoms recently and was diagnosed with orthostatic hypotension.  After discharge, patient is still felt dizzy on ambulation.  On multiple antihypertensives at home.  On torsemide, metoprolol, lisinopril at home.  Syncopal is most likely associated with orthostasis, dehydration.  Recently had extensive work-up for syncopal including MRI of the brain, carotid Doppler, echo.  Echo had shown EF of 65 to 70%, indeterminate diastolic function.  We will not repeat this work-up at this admission. PT/OT consulted.  Torsemide, metoprolol on hold IV fluids has been held because she had developed bilateral lower extremity edema. We will follow PT recommendation, orthostatic vitals during  today's therapy.  AKI on CKD stage IIIa: Baseline creatinine of 1.4.  Creatinine elevated to 2.1 on presentation.  Diuretics and fluids on hold.  Renal function improving  Hypokalemia : Supplemented   Suspected UTI/leukocytosis:  UA was suggestive for urinary tract infection with large leukocytes, WBC more than 50.  Started on ceftriaxone.  Leukocytosis improving, follow-up urine culture  Paroxysmal A-fib: Currently in normal sinus rhythm.  On metoprolol for rate control.  On Eliquis for anticoagulation.  Metoprolol on hold  Diastolic congestive heart failure: Last echocardiogram showed EF of 65 to 70%, indeterminate diastolic dysfunction.  Takes torsemide at home, currently on hold  Depression: Fluoxetine  TSH: On Synthyroid.  This is mildly elevated at 5.1  Hyperlipidemia: Continue Lipitor  Hypertension: Monitor blood pressure for now without antihypertensives.  History of CVA: Has history of ischemic/hemorrhagic stroke.  On Lipitor, Eliquis at home  Normocytic anemia: Hemoglobin dropped to 7.9 from 9 most likely from hemodilution from IV fluids.  Continue to monitor        Pressure Injury 01/01/22 Buttocks Right Stage 2 -  Partial thickness loss of dermis presenting as a shallow open injury with a red, pink wound bed without slough. (Active)  01/01/22 1517  Location: Buttocks  Location Orientation: Right  Staging: Stage 2 -  Partial thickness loss of dermis presenting as a shallow open injury with a red, pink wound bed without slough.  Wound Description (Comments):   Present on Admission:   Dressing Type Foam - Lift dressing to assess site every shift 01/02/22 0800    DVT prophylaxis:apixaban (ELIQUIS) tablet 2.5 mg Start: 01/01/22 1000 apixaban (ELIQUIS) tablet 2.5 mg     Code Status: DNR  Family Communication: Daughter at bedside on 6/16,called and discussed  with son on phone on 6/16  Patient status:Inpatient  Patient is from :Home  Anticipated discharge  VE:LFYB  Estimated DC date:tomorow   Consultants: None  Procedures:None  Antimicrobials:  Anti-infectives (From admission, onward)    Start     Dose/Rate Route Frequency Ordered Stop   01/01/22 1330  cefTRIAXone (ROCEPHIN) 1 g in sodium chloride 0.9 % 100 mL IVPB        1 g 200 mL/hr over 30 Minutes Intravenous Every 24 hours 01/01/22 1326         Subjective:  Patient seen and examined at the bedside this morning.  She feels better today.  Her blood pressure is stable.  Denies any shortness of breath or dizziness while lying in bed.  She feels better and wants to go home.  We discussed about staying 1 more day, awaiting PT evaluation and evaluation for orthostatics. Objective: Vitals:   01/01/22 1500 01/01/22 2117 01/02/22 0442 01/02/22 0958  BP: (!) 121/103 137/75 (!) 122/46 (!) 134/55  Pulse: 87 80 63 73  Resp: '16 18 18 17  '$ Temp: 97.8 F (36.6 C) 98.2 F (36.8 C) (!) 97.5 F (36.4 C) (!) 97.4 F (36.3 C)  TempSrc:  Oral Oral Oral  SpO2: 97% 99% 98% 97%    Intake/Output Summary (Last 24 hours) at 01/02/2022 1119 Last data filed at 01/02/2022 0834 Gross per 24 hour  Intake 180 ml  Output 25 ml  Net 155 ml   There were no vitals filed for this visit.  Examination:  General exam: Overall comfortable, not in distress, pleasant elderly female HEENT: PERRL Respiratory system: Few crackles in the bases Cardiovascular system: S1 & S2 heard, RRR.  Gastrointestinal system: Abdomen is nondistended, soft and nontender. Central nervous system: Alert and oriented Extremities: Trace lower extremity edema, no clubbing ,no cyanosis Skin: No rashes, no ulcers,no icterus     Data Reviewed: I have personally reviewed following labs and imaging studies  CBC: Recent Labs  Lab 12/31/21 2001 01/01/22 0515 01/02/22 0301  WBC 11.8* 18.2* 9.1  NEUTROABS 9.8*  --   --   HGB 9.3* 7.9* 7.8*  HCT 29.6* 25.8* 24.2*  MCV 97.4 97.0 95.3  PLT 304 251 017   Basic Metabolic  Panel: Recent Labs  Lab 12/31/21 2001 01/01/22 0515 01/02/22 0301  NA 136 136 139  K 3.6 3.1* 3.0*  CL 101 105 108  CO2 '22 22 22  '$ GLUCOSE 180* 121* 100*  BUN 24* 22 20  CREATININE 2.16* 1.98* 1.78*  CALCIUM 8.2* 7.2* 7.5*  MG 1.8  --   --      No results found for this or any previous visit (from the past 240 hour(s)).   Radiology Studies: DG Chest 2 View  Result Date: 12/31/2021 CLINICAL DATA:  Syncope. EXAM: CHEST - 2 VIEW COMPARISON:  Chest radiograph dated 12/13/2021. FINDINGS: Faint left lung base density, likely atelectasis. Developing infiltrate is not excluded. Trace left pleural effusion may be present. The right lung is clear. No pneumothorax. Stable cardiac silhouette. Atherosclerotic calcification of the aorta. No acute osseous pathology. IMPRESSION: Minimal left lung base atelectasis or less likely infiltrate. Overall significant improvement in the aeration of the lungs compared to prior radiograph and near complete resolution of the previously seen opacities. Electronically Signed   By: Anner Crete M.D.   On: 12/31/2021 20:19    Scheduled Meds:  apixaban  2.5 mg Oral BID   atorvastatin  20 mg Oral Daily   FLUoxetine  40  mg Oral Daily   levothyroxine  75 mcg Oral Q0600   metoprolol tartrate  25 mg Oral BID   sodium chloride flush  3 mL Intravenous Q12H   Continuous Infusions:  cefTRIAXone (ROCEPHIN)  IV 1 g (01/01/22 1500)     LOS: 0 days   Shelly Coss, MD [facility] P6/16/2023, 11:19 AM

## 2022-01-02 NOTE — Care Management Obs Status (Deleted)
[place] NOTIFICATION   Patient Details  Name: Valerie Mcclure MRN: [id number] Date of Birth: 03/16/1941   Medicare Observation Status Notification Given:  Yes    Tom-Johnson, Renea Ee, RN 01/02/2022, 10:08 AM

## 2022-01-02 NOTE — Evaluation (Signed)
Occupational Therapy Evaluation Patient Details Name: YULENI BURICH MRN: [id number] DOB: Mar 23, 1941 Today's Date: 01/02/2022   History of Present Illness Pt is a 81 y/o female presenting to ED on 6/14 after syncopal episode. PMH includes: L ischemia MCA CVA, HTN, osteopenia, afib, sacral wound., memory deficits, anemia   Clinical Impression   Pt completes LB selfcare at mod to max assist with sit to stand and functional transfers at a light mod assist with use of the RW for support.  BP remained stable throughout all positions with supine at 122/50, sitting at 132/55, and standing at 122/41.  Feel pt will benefit from acute care OT to help strengthen and progress ADL performance to a min assist level for discharge home with family and 24 hour supervision.     Recommendations for follow up therapy are one component of a multi-disciplinary discharge planning process, led by the attending physician.  Recommendations may be updated based on patient status, additional functional criteria and insurance authorization.   Follow Up Recommendations  Skilled nursing-short term rehab (<3 hours/day)    Assistance Recommended at Discharge Frequent or constant Supervision/Assistance  Patient can return home with the following A little help with walking and/or transfers;A lot of help with bathing/dressing/bathroom;Assistance with cooking/housework;Direct supervision/assist for financial management;Assist for transportation;Help with stairs or ramp for entrance;Direct supervision/assist for medications management    Functional Status Assessment  Patient has had a recent decline in their functional status and demonstrates the ability to make significant improvements in function in a reasonable and predictable amount of time.  Equipment Recommendations  None recommended by OT (pt's son is getting a tub bench)    Recommendations for Other Services       Precautions / Restrictions  Precautions Precautions: Fall Restrictions Weight Bearing Restrictions: No      Mobility Bed Mobility Overal bed mobility: Needs Assistance Bed Mobility: Supine to Sit, Sit to Supine, Rolling Rolling: Min guard (with use of the rail)   Supine to sit: Mod assist (HOB flat)     General bed mobility comments: Pt needed assist with sequencing supine to sit and bringing trunk up to sitting.    Transfers Overall transfer level: Needs assistance Equipment used: Rolling walker (2 wheels) Transfers: Sit to/from Stand Sit to Stand: Mod assist Stand pivot transfers: Mod assist   Step pivot transfers: Mod assist     General transfer comment: Mod assist for sit to stand secondary to increased extensor pattern in the trunk and RLE.  She was able to ambulate around the bed to the recliner at [facility] assist with use of the RW with flexed posture.      Balance Overall balance assessment: Needs assistance Sitting-balance support: No upper extremity supported, Feet unsupported Sitting balance-Leahy Scale: Fair Sitting balance - Comments: increased posterior lean when using UEs for grooming EOB unsupported   Standing balance support: During functional activity, Reliant on assistive device for balance Standing balance-Leahy Scale: Poor Standing balance comment: Pt needs BUE support and help from therapist to maintain balance.                           ADL either performed or assessed with clinical judgement   ADL Overall ADL's : Needs assistance/impaired Eating/Feeding: Set up;Sitting   Grooming: Wash/dry hands;Oral care;Set up;Sitting   Upper Body Bathing: Supervision/ safety;Sitting   Lower Body Bathing: Moderate assistance;Sit to/from stand       Lower Body Dressing: Maximal assistance;Sit to/from stand  Toilet Transfer: Moderate assistance;BSC/3in1;Stand-pivot;Ambulation;Rolling walker (2 wheels) Toilet Transfer Details (indicate cue type and reason): simulated, pt  declined need to toilet Toileting- Clothing Manipulation and Hygiene: Moderate assistance;Sit to/from stand       Functional mobility during ADLs: Moderate assistance;Rolling walker (2 wheels) General ADL Comments: Pt with decreased motor planning in the LUE.  Uses the LUE as a gross assist for holding items, but relies on the LUE to assist with removing caps and items.     Vision Baseline Vision/History: 1 Wears glasses (near vision) Ability to See in Adequate Light: 0 Adequate Patient Visual Report: No change from baseline Vision Assessment?: No apparent visual deficits     Perception Perception Perception: Within Functional Limits   Praxis Praxis Praxis: Impaired Praxis Impairment Details: Motor planning Praxis-Other Comments: decreased motor planning at baseline with the RUE.    Pertinent Vitals/Pain Pain Assessment Pain Assessment: No/denies pain Faces Pain Scale: No hurt     Hand Dominance Right   Extremity/Trunk Assessment Upper Extremity Assessment Upper Extremity Assessment: RUE deficits/detail RUE Deficits / Details: Pt with slight hemiparesis from previous CVA but currently uses at a gross assist level.  AROM shoulder flexion 0-120 degrees with isolated motion in the elbow, wrist, and digits.  Noted motor planning deficits as well.  Daughter reports that since CVA, pt uses the left hand dominantly. RUE Sensation: decreased light touch;decreased proprioception RUE Coordination: decreased fine motor;decreased gross motor   Lower Extremity Assessment Lower Extremity Assessment: Defer to PT evaluation   Cervical / Trunk Assessment Cervical / Trunk Assessment: Kyphotic   Communication Communication Communication: Expressive difficulties (prior CVA)   Cognition Arousal/Alertness: Awake/alert Behavior During Therapy: WFL for tasks assessed/performed Overall Cognitive Status: History of cognitive impairments - at baseline Area of Impairment: Problem solving,  Awareness, Safety/judgement, Following commands, Memory                 Orientation Level: Disoriented to, Situation, Time Current Attention Level: Sustained Memory: Decreased short-term memory Following Commands: Follows one step commands consistently, Follows multi-step commands with increased time Safety/Judgement: Decreased awareness of safety Awareness: Emergent Problem Solving: Requires verbal cues, Requires tactile cues, Difficulty sequencing, Slow processing General Comments: Pt with history of left CVA with cognitive impairments.  Multiple times throughout session pt's daughter had to help correct her when PLOF information was not corrected.                Home Living Family/patient expects to be discharged to:: Private residence Living Arrangements: Children Available Help at Discharge: Family;Available 24 hours/day Type of Home: Mobile home Home Access: Stairs to enter Entrance Stairs-Number of Steps: 5 Entrance Stairs-Rails: Can reach both;Right;Left Home Layout: One level     Bathroom Shower/Tub: Occupational psychologist: Handicapped height     Home Equipment: Conservation officer, nature (2 wheels);Wheelchair - manual;Cane - single point;BSC/3in1          Prior Functioning/Environment Prior Level of Function : Needs assist       Physical Assist : Mobility (physical);ADLs (physical) Mobility (physical): Bed mobility;Transfers;Gait   Mobility Comments: Family has been assisting with transfers to w/c and short ambulation with walker. Pt had only been home from SNF a few days prior to this encounter ADLs Comments: reports some assist with bathing, dressing (bra), limited IADLs, manages medication        OT Problem List: Decreased strength;Decreased activity tolerance;Impaired balance (sitting and/or standing);Decreased cognition;Decreased safety awareness;Decreased knowledge of use of DME or AE;Decreased knowledge of precautions;Cardiopulmonary status  limiting activity;Decreased coordination;Impaired tone;Impaired UE functional use;Impaired sensation      OT Treatment/Interventions: Self-care/ADL training;Therapeutic exercise;DME and/or AE instruction;Cognitive remediation/compensation;Therapeutic activities;Patient/family education;Balance training;Neuromuscular education    OT Goals(Current goals can be found in the care plan section) Acute Rehab OT Goals Patient Stated Goal: Pt did not state but agreeable to therapy. OT Goal Formulation: With patient/family Time For Goal Achievement: 01/16/22 Potential to Achieve Goals: Good  OT Frequency: Min 2X/week    Co-evaluation PT/OT/SLP Co-Evaluation/Treatment: Yes            AM-PAC OT "6 Clicks" Daily Activity     Outcome Measure Help from another person eating meals?: A Little Help from another person taking care of personal grooming?: A Little Help from another person toileting, which includes using toliet, bedpan, or urinal?: A Lot Help from another person bathing (including washing, rinsing, drying)?: A Lot Help from another person to put on and taking off regular upper body clothing?: A Little Help from another person to put on and taking off regular lower body clothing?: A Lot 6 Click Score: 15   End of Session Equipment Utilized During Treatment: Gait belt;Rolling walker (2 wheels) Nurse Communication: Mobility status  Activity Tolerance: Patient tolerated treatment well Patient left: with call bell/phone within reach;with family/visitor present;in chair  OT Visit Diagnosis: Other abnormalities of gait and mobility (R26.89);Muscle weakness (generalized) (M62.81);Other symptoms and signs involving cognitive function;History of falling (Z91.81);Hemiplegia and hemiparesis Hemiplegia - Right/Left: Right Hemiplegia - dominant/non-dominant: Dominant Hemiplegia - caused by: Cerebral infarction                Time: 0802-2336 OT Time Calculation (min): 60 min Charges:  OT  General Charges $OT Visit: 1 Visit OT Evaluation $OT Eval Moderate Complexity: 1 Mod OT Treatments $Self Care/Home Management : 38-52 mins Rayla Pember OTR/L 01/02/2022, 11:41 AM

## 2022-01-02 NOTE — Progress Notes (Signed)
Initial Nutrition Assessment  DOCUMENTATION CODES:   Not applicable  INTERVENTION:  Liberalize diet from a heart healthy/carb modified to a 2g sodium diet to provide widest variety of menu options to enhance nutritional adequacy Ensure Enlive po BID, each supplement provides 350 kcal and 20 grams of protein.  NUTRITION DIAGNOSIS:   Increased nutrient needs related to acute illness as evidenced by estimated needs.  GOAL:   Patient will meet greater than or equal to 90% of their needs  MONITOR:   PO intake, Supplement acceptance, Diet advancement, Labs, Weight trends, I & O's  REASON FOR ASSESSMENT:   Malnutrition Screening Tool    ASSESSMENT:   Pt admitted with syncope and collapse. Recently admitted 3 weeks ago with syncope, found to be secondary to orthostatic hypotension. PMH significant for CKD stage IV, afib,  CHF, depression, hypothyroidism, HLD, T2DM, ischemic/hemorrhagic stroke, HTN, and memory deficits.  Food allergies: shrimp  Pt sitting in chair eating lunch during time of visit. She states that her husband passed away 6 months ago and she has since had a couple hospital admission. Despite this, she has continued to eat at her baseline and denies changes to her PO intake. She recalls at home she may eat cereal or a biscuit with gravy for breakfast, a sandwich for lunch and her son brings a meal for her dinner which may include chicken and a side. Pt states that she does not believe she has diabetes and does not take medication or insulin for blood sugar control at home.   Meal completions:  06/15: 75%-dinner  Pt is unsure of recent wt loss and suspects her clothes may be fitting a little more loose lately. Her last known wt was ~2 months ago around 160 lbs. Most recent documented wt was 76.2 kg on 5/31 which is +3kg within the last year. Will continue to monitor throughout admission.  Edema: non-pitting BUE, mild pitting BLE   Medications: synthroid, IV  abx  Labs: potassium 3.0 (replacing), Cr 1.78, GFR 28, CBG 174, HgbA1c 6.6%  NUTRITION - FOCUSED PHYSICAL EXAM: Pt eating at time of visit. Deferred to follow up.   Diet Order:   Diet Order             Diet heart healthy/carb modified Room service appropriate? Yes; Fluid consistency: Thin  Diet effective now                   EDUCATION NEEDS:   Education needs have been addressed  Skin:  Skin Assessment: Skin Integrity Issues: Skin Integrity Issues:: Stage II Stage II: R buttock  Last BM:  6/15 (type 3, type 4)  Height:   Ht Readings from Last 1 Encounters:  12/09/21 '5\' 6"'$  (1.676 m)    Weight:   Wt Readings from Last 1 Encounters:  12/17/21 76.2 kg   BMI:  There is no height or weight on file to calculate BMI.  Estimated Nutritional Needs:   Kcal:  1600-1800  Protein:  80-95g  Fluid:  >/=1.6L  Clayborne Dana, RDN, LDN Clinical Nutrition

## 2022-01-02 NOTE — TOC Initial Note (Addendum)
Transition of Care [facility]) - Initial/Assessment Note    Patient Details  Name: Valerie Mcclure MRN: [id number] Date of Birth: 1941/04/07  Transition of Care [facility]) CM/SW Contact:    Tom-Johnson, Renea Ee, RN Phone Number: 01/02/2022, 2:14 PM  Clinical Narrative:                  CM spoke with patient and daughter, Valerie Mcclure at bedside about needs for post hospital transition. Admitted for Syncope.Had  a recent admit with same symptoms and went to [facility] for rehab.  From home with daughter who is her primary caregiver. Has a cane,walker, wheelchair and bsc at home.  Daughter transports to and from appointments.  PCP is Burns, Claudina Lick, MD and uses [facility] on [address]. Home health referral sent to [facility] per daughter's request and patient was supposed to start with them today. Info on AVS. Cory voiced acceptance to resume care.  Daughter to transport at discharge.  CM will continue to follow with needs.   Expected Discharge Plan: [place] Barriers to Discharge: Continued Medical Work up   Patient Goals and CMS Choice Patient states their goals for this hospitalization and ongoing recovery are:: To reurn home CMS Medicare.gov Compare Post Acute Care list provided to:: Patient Choice offered to / list presented to : Patient, Adult Children (Daughter, Valerie Mcclure.)  Expected Discharge Plan and Services Expected Discharge Plan: [place]   Discharge Planning Services: CM Consult Post Acute Care Choice: [place] arrangements for the past 2 months: Single Family Home                 DME Arranged: N/A DME Agency: NA       HH Arranged: PT, OT HH Agency: [facility] Date [facility] Agency Contacted: 01/02/22 Time HH Agency Contacted: [phone] Representative spoke with at [place]: Tommi Rumps  Prior Living Arrangements/Services Living arrangements for the past 2 months: [place] with:: Adult Children  (Daughter, Counsellor.) Patient language and need for interpreter reviewed:: Yes Do you feel safe going back to the place where you live?: Yes      Need for Family Participation in Patient Care: Yes (Comment) Care giver support system in place?: Yes (comment) Current home services: DME Criminal Activity/Legal Involvement Pertinent to Current Situation/Hospitalization: No - Comment as needed  Activities of Daily Living Home Assistive Devices/Equipment: Walker (specify type) ADL Screening (condition at time of admission) Patient's cognitive ability adequate to safely complete daily activities?: Yes Is the patient deaf or have difficulty hearing?: No Does the patient have difficulty seeing, even when wearing glasses/contacts?: No Does the patient have difficulty concentrating, remembering, or making decisions?: No Patient able to express need for assistance with ADLs?: Yes Does the patient have difficulty dressing or bathing?: Yes Independently performs ADLs?: No Communication: Independent Dressing (OT): Needs assistance Grooming: Needs assistance Feeding: Independent Bathing: Needs assistance Toileting: Independent, Needs assistance Does the patient have difficulty walking or climbing stairs?: Yes Weakness of Legs: Both Weakness of Arms/Hands: None  Permission Sought/Granted Permission sought to share information with : Case Manager, Customer service manager, Family Supports Permission granted to share information with : Yes, Verbal Permission Granted              Emotional Assessment Appearance:: Appears stated age Attitude/Demeanor/Rapport: Engaged, Gracious Affect (typically observed): Accepting, Appropriate, Calm, Hopeful   Alcohol / Substance Use: Not Applicable Psych Involvement: No (comment)  Admission diagnosis:  Syncope and collapse [  R55] AKI (acute kidney injury) ([place]) [N17.9] Syncope, unspecified syncope type [R55] Syncope [R55] Patient Active Problem List    Diagnosis Date Noted   Syncope 01/02/2022   General weakness    Skin ulcer of sacrum ([place])    Metabolic acidosis 29/52/8413   Normocytic anemia 12/11/2021   Thrombocytopenia ([place]) 12/11/2021   Memory deficit 24/40/1027   Acute metabolic encephalopathy 25/36/6440   Syncope and collapse 12/09/2021   Stroke-like symptom 12/09/2021   SIRS (systemic inflammatory response syndrome) ([place]) 12/09/2021   Orthostatic hypotension 12/09/2021   Leg ulcer, left ([place]) 10/13/2021   Eye tearing, left 02/19/2020   TMJ syndrome, left 02/19/2020   URI (upper respiratory infection) 01/24/2020   Lump in neck 06/08/2018   Sleep difficulties 03/31/2018   Persistent atrial fibrillation ([place]) 09/06/2017   TIA (transient ischemic attack) 07/12/2017   Bradycardia 04/12/2017   Depression 01/29/2017   Fall    Aphasia as late effect of cerebrovascular accident (CVA)    Acute renal failure ([place]) 01/11/2017   Chronic diastolic heart failure ([name])    Dysarthria    Atrial flutter ([place]) 12/22/2016   Spastic hemiplegia of right dominant side as late effect of cerebral infarction ([employer])    Anemia of chronic disease    Hypertension    Dysphagia, post-stroke    Anterior cerebral circulation hemorrhagic infarction ([place]) 12/03/2016   Right hemiparesis ([place])    Nontraumatic subcortical hemorrhage of left cerebral hemisphere ([place])    Mild aortic regurgitation 08/20/2016   Bilateral edema of lower extremity 04/07/2016   Pancreatic duct dilated 01/09/2016   Stage 4 chronic kidney disease ([place]) 12/25/2015   Mild tricuspid regurgitation 12/25/2015   Mild mitral regurgitation 12/25/2015   Diabetes mellitus without complication ([place]) 34/74/2595   Abdominal pain, chronic, right upper quadrant 06/04/2015   Chronic low back pain    Pain in thoracic spine 04/18/2010   Coronary atherosclerosis 01/24/2009   ARTHRITIS, LEFT KNEE 12/13/2008   Hypothyroidism 11/21/2007   Hyperlipidemia 11/09/2007   Osteopenia 11/09/2007    PCP:  [name], MD Pharmacy:   [facility] [place], [address] AT [address] [place] [address] Phone: [phone] Fax: [phone]     Social Determinants of Health (SDOH) Interventions    Readmission Risk Interventions     No data to display

## 2022-01-03 DIAGNOSIS — R55 Syncope and collapse: Secondary | ICD-10-CM | POA: Diagnosis not present

## 2022-01-03 LAB — BASIC METABOLIC PANEL
Anion gap: 10 (ref 5–15)
BUN: 16 mg/dL (ref 8–23)
CO2: 19 mmol/L — ABNORMAL LOW (ref 22–32)
Calcium: 8 mg/dL — ABNORMAL LOW (ref 8.9–10.3)
Chloride: 106 mmol/L (ref 98–111)
Creatinine, Ser: 1.54 mg/dL — ABNORMAL HIGH (ref 0.44–1.00)
GFR, Estimated: 34 mL/min — ABNORMAL LOW (ref >60)
Glucose, Bld: 121 mg/dL — ABNORMAL HIGH (ref 70–99)
Potassium: 4.2 mmol/L (ref 3.5–5.1)
Sodium: 135 mmol/L (ref 135–145)

## 2022-01-03 LAB — CBC
HCT: 27.5 % — ABNORMAL LOW (ref 36.0–46.0)
Hemoglobin: 8.9 g/dL — ABNORMAL LOW (ref 12.0–15.0)
MCH: 30.6 pg (ref 26.0–34.0)
MCHC: 32.4 g/dL (ref 30.0–36.0)
MCV: 94.5 fL (ref 80.0–100.0)
Platelets: 251 10*3/uL (ref 150–400)
RBC: 2.91 MIL/uL — ABNORMAL LOW (ref 3.87–5.11)
RDW: 15.2 % (ref 11.5–15.5)
WBC: 8 10*3/uL (ref 4.0–10.5)
nRBC: 0 % (ref 0.0–0.2)

## 2022-01-03 LAB — GLUCOSE, CAPILLARY: Glucose-Capillary: 153 mg/dL — ABNORMAL HIGH (ref 70–99)

## 2022-01-03 MED ORDER — METOPROLOL TARTRATE 25 MG PO TABS: 12.5000 mg | ORAL_TABLET | Freq: Two times a day (BID) | ORAL | 0 refills | Status: DC

## 2022-01-03 MED ORDER — TORSEMIDE 10 MG PO TABS: 10.0000 mg | ORAL_TABLET | Freq: Every day | ORAL | 0 refills | Status: DC

## 2022-01-03 NOTE — TOC Transition Note (Signed)
Transition of Care [facility]) - CM/SW Discharge Note   Patient Details  Name: Valerie Mcclure MRN: [id number] Date of Birth: 06/19/1941  Transition of Care [facility]) CM/SW Contact:  Bartholomew Crews, RN Phone Number: [phone] 01/03/2022, 11:30 AM   Clinical Narrative:     Patient to transition home today. Previous RNCM verified DME and HH in place. HH orders sent to MD for signature. Liaison at [facility] notified of transition home today. No further TOC needs identified.   Final next level of care: [place] Barriers to Discharge: No Barriers Identified   Patient Goals and CMS Choice Patient states their goals for this hospitalization and ongoing recovery are:: To reurn home CMS Medicare.gov Compare Post Acute Care list provided to:: Patient Choice offered to / list presented to : Patient, Adult Children (Daughter, Counsellor.)  Discharge Placement                       Discharge Plan and Services   Discharge Planning Services: CM Consult Post Acute Care Choice: Home Health          DME Arranged: N/A DME Agency: NA       HH Arranged: PT, OT [place] Agency: [place] Date [place]: 01/03/22 Time [place]: [id number] Representative spoke with at [place]: Tommi Rumps  Social Determinants of Health ([place]) Interventions     Readmission Risk Interventions     No data to display

## 2022-01-03 NOTE — Discharge Summary (Signed)
Physician Discharge Summary  Valerie Mcclure MWN:[id number] DOB: 02/19/1941 DOA: 12/31/2021  PCP: [name], MD  Admit date: 12/31/2021 Discharge date: 01/03/2022  Admitted From: Home Disposition:  Home  Discharge Condition:Stable CODE STATUS: DNR Diet recommendation: Heart Healthy  Brief/Interim Summary:  Patient is a 81 year old female with history of CKD stage IV, A-fib, CHF, depression, hypothyroidism, hyperlipidemia, diabetes type 2, ischemic/hemorrhagic stroke, hypertension, memory deficits who presented from home with complaint of syncopal, collapse.  She was recently admitted here for the evaluation of syncopal 3 weeks ago, at that time orthostatic hypotension was contributed.  Since returning home, patient was frequently dizzy on ambulation.  On presentation, lab work showed elevated creatinine to 2.1(baseline creatinine of 1.4), leukocytosis.  Patient was started on IV fluids,now stopped.  Kidney function improving now close to baseline.  She was evaluated by physical therapy again here and orthostatics checked, she is not orthostatic, she denies any dizziness or lightheadedness on ambulation.  She is medically stable for discharge to home today .medications adjusted.    Following problems were addressed during her hospitalization:  Syncope/collapse: Presented with similar symptoms recently and was diagnosed with orthostatic hypotension.  After discharge, patient is still felt dizzy on ambulation.  On multiple antihypertensives at home.  On torsemide, metoprolol, lisinopril at home.  Syncopal is most likely associated with orthostasis, dehydration.  Recently had extensive work-up for syncopal including MRI of the brain, carotid Doppler, echo.  Echo had shown EF of 65 to 70%, indeterminate diastolic function.  We will not repeat this work-up at this admission. PT/OT consulted.  IV fluids have been held because she had developed bilateral lower extremity edema. She was seen by PT  again on 6/16, she was not orthostatic, she was not feeling dizziness or lightheadedness.  She already has home health set up at home  AKI on CKD stage IIIa: Baseline creatinine of 1.4.  Creatinine elevated to 2.1 on presentation.  Currently kidney function at baseline  Hypokalemia : Supplemented    Suspected UTI/leukocytosis:  UA was suggestive for urinary tract infection with large leukocytes, WBC more than 50.  Started on ceftriaxone.  Leukocytosis resolved.  Completed 3 days course   paroxysmal A-fib: Currently in normal sinus rhythm.  On metoprolol for rate control.  On Eliquis for anticoagulation.  Dose of metoprolol reduced  Diastolic congestive heart failure: Last echocardiogram showed EF of 65 to 70%, indeterminate diastolic dysfunction.  Takes torsemide at home 20 mg, dose reduced to 10 mg   Depression: Fluoxetine   TSH: On Synthyroid.  This is mildly elevated at 5.1   Hyperlipidemia: Continue Lipitor   Hypertension: Monitor blood pressure for now without antihypertensives.   History of CVA: Has history of ischemic/hemorrhagic stroke.  On Lipitor, Eliquis at home   Normocytic anemia: Hemoglobin dropped to 7.9 from 9 most likely from hemodilution from IV fluids.  Continue to monitor as an outpatient     Discharge Diagnoses:  Principal Problem:   Syncope and collapse Active Problems:   Chronic diastolic heart failure ([name])   Depression   Hypothyroidism   Hyperlipidemia   Diabetes mellitus without complication ([name])   Right hemiparesis ([name])   Anemia of chronic disease   Atrial flutter ([name])   Syncope    Discharge Instructions  Discharge Instructions     Diet - low sodium heart healthy   Complete by: As directed    Discharge instructions   Complete by: As directed    1)Please take prescribed medication as instructed 2)Follow  up with your PCP in a week   Increase activity slowly   Complete by: As directed    No wound care   Complete by: As directed        Allergies as of 01/03/2022       Reactions   Shrimp [shellfish Allergy] Anaphylaxis, Rash   Break outs and swelling of the throat   Valsartan Other (See Comments)   Renal failure   Tandem Plus [fefum-fepo-fa-b Cmp-c-zn-mn-cu] Nausea And Vomiting   Ivp Dye [iodinated Contrast Media] Rash   itching   Penicillins Itching, Rash   Has patient had a PCN reaction causing immediate rash, facial/tongue/throat swelling, SOB or lightheadedness with hypotension: Yes Has patient had a PCN reaction causing severe rash involving mucus membranes or skin necrosis: No Has patient had a PCN reaction that required hospitalization: No Has patient had a PCN reaction occurring within the last 10 years: No If all of the above answers are "NO", then may proceed with Cephalosporin use.        Medication List     TAKE these medications    acetaminophen 500 MG tablet Commonly known as: TYLENOL Take 1,000 mg by mouth every 6 (six) hours as needed for moderate pain.   atorvastatin 20 MG tablet Commonly known as: LIPITOR TAKE 1 TABLET(20 MG) BY MOUTH DAILY What changed: See the new instructions.   CALCIUM-VITAMIN D PO Take 1 tablet by mouth daily.   clonazePAM 0.5 MG tablet Commonly known as: KLONOPIN Take 0.5 tablets (0.25 mg total) by mouth 2 (two) times daily as needed for anxiety.   Eliquis 2.5 MG Tabs tablet Generic drug: apixaban TAKE 1 TABLET(2.5 MG) BY MOUTH TWICE DAILY What changed: See the new instructions.   FLUoxetine 40 MG capsule Commonly known as: PROZAC TAKE 1 CAPSULE(40 MG) BY MOUTH DAILY What changed: See the new instructions.   levothyroxine 75 MCG tablet Commonly known as: SYNTHROID TAKE 1 TABLET(75 MCG) BY MOUTH DAILY BEFORE BREAKFAST. FOLLOW-UP What changed: See the new instructions.   metoprolol tartrate 25 MG tablet Commonly known as: LOPRESSOR Take 0.5 tablets (12.5 mg total) by mouth 2 (two) times daily. What changed: See the new instructions.    nortriptyline 10 MG capsule Commonly known as: PAMELOR TAKE 4 CAPSULES(40 MG) BY MOUTH AT BEDTIME What changed: See the new instructions.   senna-docusate 8.6-50 MG tablet Commonly known as: Senokot-S Take 2 tablets by mouth 2 (two) times daily.   tamsulosin 0.4 MG Caps capsule Commonly known as: FLOMAX Take 1 capsule (0.4 mg total) by mouth daily.   torsemide 10 MG tablet Commonly known as: DEMADEX Take 1 tablet (10 mg total) by mouth daily. What changed:  medication strength See the new instructions.        Follow-up Information     [name], MD. Schedule an appointment as soon as possible for a visit in 1 week(s).   Specialty: Internal Medicine Contact information: [place] [phone]                Allergies  Allergen Reactions   Shrimp [Shellfish Allergy] Anaphylaxis and Rash    Break outs and swelling of the throat   Valsartan Other (See Comments)    Renal failure   Tandem Plus [Fefum-Fepo-Fa-B Cmp-C-Zn-Mn-Cu] Nausea And Vomiting   Ivp Dye [Iodinated Contrast Media] Rash    itching   Penicillins Itching and Rash    Has patient had a PCN reaction causing immediate rash, facial/tongue/throat swelling,  SOB or lightheadedness with hypotension: Yes Has patient had a PCN reaction causing severe rash involving mucus membranes or skin necrosis: No Has patient had a PCN reaction that required hospitalization: No Has patient had a PCN reaction occurring within the last 10 years: No If all of the above answers are "NO", then may proceed with Cephalosporin use.     Consultations: None   Procedures/Studies: DG Chest 2 View  Result Date: 12/31/2021 CLINICAL DATA:  Syncope. EXAM: CHEST - 2 VIEW COMPARISON:  Chest radiograph dated 12/13/2021. FINDINGS: Faint left lung base density, likely atelectasis. Developing infiltrate is not excluded. Trace left pleural effusion may be present. The right lung is clear. No  pneumothorax. Stable cardiac silhouette. Atherosclerotic calcification of the aorta. No acute osseous pathology. IMPRESSION: Minimal left lung base atelectasis or less likely infiltrate. Overall significant improvement in the aeration of the lungs compared to prior radiograph and near complete resolution of the previously seen opacities. Electronically Signed   By: Anner Crete M.D.   On: 12/31/2021 20:19   CT HEAD WO CONTRAST (5MM)  Result Date: 12/13/2021 CLINICAL DATA:  Mental status change of unknown etiology. EXAM: CT HEAD WITHOUT CONTRAST TECHNIQUE: Contiguous axial images were obtained from the base of the skull through the vertex without intravenous contrast. RADIATION DOSE REDUCTION: This exam was performed according to the departmental dose-optimization program which includes automated exposure control, adjustment of the mA and/or kV according to patient size and/or use of iterative reconstruction technique. COMPARISON:  MR brain 12/08/2021 FINDINGS: Brain: No evidence of acute infarction, hemorrhage, hydrocephalus, extra-axial collection or mass lesion/mass effect. Again noted are changes secondary to remote left MCA infarct involving the left basal ganglia, left temporal lobe, and left insula. Ex vacuo dilatation of the left lateral ventricle is noted and there is well layering in degeneration in the left cerebral peduncle. Prominence of the sulci and ventricles compatible with brain atrophy. Vascular: No hyperdense vessel or unexpected calcification. Skull: Normal. Negative for fracture or focal lesion. Sinuses/Orbits: No acute finding. Other: None IMPRESSION: 1. No acute intracranial abnormalities. 2. Chronic left MCA infarct. 3. Chronic small vessel ischemic disease and brain atrophy. Electronically Signed   By: Kerby Moors M.D.   On: 12/13/2021 17:53   DG CHEST PORT 1 VIEW  Result Date: 12/13/2021 CLINICAL DATA:  Shortness of breath. EXAM: PORTABLE CHEST 1 VIEW COMPARISON:  12/08/2021  FINDINGS: 0723 hours. Low IM film. The cardio pericardial silhouette is enlarged. Interval increase in bibasilar atelectasis. No substantial pleural effusion or pulmonary edema. The visualized bony structures of the thorax are unremarkable. Telemetry leads overlie the chest. IMPRESSION: Interval progression of bibasilar atelectasis on this low volume film. Electronically Signed   By: Misty Stanley M.D.   On: 12/13/2021 07:49   VAS [place] ABI WITH/WO TBI  Result Date: 12/12/2021  LOWER EXTREMITY DOPPLER STUDY Patient Name:  Valerie Mcclure  Date of Exam:   12/12/2021 Medical Rec #: [id number]            Accession #:    [id number] Date of Birth: 09-14-1940            Patient Gender: F Patient Age:   36 years Exam Location:  [facility] Procedure:      VAS [place] ABI WITH/WO TBI Referring Phys: PRANAV PATEL --------------------------------------------------------------------------------  Indications: Peripheral artery disease. High Risk Factors: Hyperlipidemia, Diabetes.  Comparison Study: No prior studies. Performing Technologist: Carlos Levering RVT  Examination Guidelines: A complete evaluation includes at minimum, Doppler waveform signals  and systolic blood pressure reading at the level of bilateral brachial, anterior tibial, and posterior tibial arteries, when vessel segments are accessible. Bilateral testing is considered an integral part of a complete examination. Photoelectric Plethysmograph (PPG) waveforms and toe systolic pressure readings are included as required and additional duplex testing as needed. Limited examinations for reoccurring indications may be performed as noted.  ABI Findings: +---------+------------------+-----+---------+--------+ Right    Rt Pressure (mmHg)IndexWaveform Comment  +---------+------------------+-----+---------+--------+ Brachial 162                    triphasic         +---------+------------------+-----+---------+--------+ PTA      185               1.14  biphasic          +---------+------------------+-----+---------+--------+ DP       182               1.12 biphasic          +---------+------------------+-----+---------+--------+ Great Toe147               0.91                   +---------+------------------+-----+---------+--------+ +---------+------------------+-----+---------+-------+ Left     Lt Pressure (mmHg)IndexWaveform Comment +---------+------------------+-----+---------+-------+ Brachial 145                    triphasic        +---------+------------------+-----+---------+-------+ PTA      174               1.07 biphasic         +---------+------------------+-----+---------+-------+ DP       189               1.17 biphasic         +---------+------------------+-----+---------+-------+ Great Toe119               0.73                  +---------+------------------+-----+---------+-------+ +-------+-----------+-----------+------------+------------+ ABI/TBIToday's ABIToday's TBIPrevious ABIPrevious TBI +-------+-----------+-----------+------------+------------+ Right  1.14       0.91                                +-------+-----------+-----------+------------+------------+ Left   1.17       0.73                                +-------+-----------+-----------+------------+------------+  Summary: Right: Resting right ankle-brachial index is within normal range. No evidence of significant right lower extremity arterial disease. The right toe-brachial index is normal. Left: Resting left ankle-brachial index is within normal range. No evidence of significant left lower extremity arterial disease. The left toe-brachial index is normal. *See table(s) above for measurements and observations.  Electronically signed by Servando Snare MD on 12/12/2021 at 7:02:41 PM.    Final    VAS [place] LOWER EXTREMITY VENOUS (DVT)  Result Date: 12/11/2021  Lower Venous DVT Study Patient Name:  Valerie Mcclure  Date of  Exam:   12/11/2021 Medical Rec #: [id number]            Accession #:    [id number] Date of Birth: [birth year]            Patient Gender: F Patient Age:   80 years Exam Location:  [facility]  Procedure:      VAS [place] LOWER EXTREMITY VENOUS (DVT) Referring Phys: PRANAV PATEL --------------------------------------------------------------------------------  Indications: Edema.  Risk Factors: None identified. Limitations: Body habitus, poor ultrasound/tissue interface and patient positioning, patient immobility, patient pain tolerance, patient somnolence. Comparison Study: No prior studies. Performing Technologist: Oliver Hum RVT  Examination Guidelines: A complete evaluation includes B-mode imaging, spectral Doppler, color Doppler, and power Doppler as needed of all accessible portions of each vessel. Bilateral testing is considered an integral part of a complete examination. Limited examinations for reoccurring indications may be performed as noted. The reflux portion of the exam is performed with the patient in reverse Trendelenburg.  +---------+---------------+---------+-----------+----------+-------------------+ RIGHT    CompressibilityPhasicitySpontaneityPropertiesThrombus Aging      +---------+---------------+---------+-----------+----------+-------------------+ CFV      Full           Yes      Yes                                      +---------+---------------+---------+-----------+----------+-------------------+ SFJ      Full                                                             +---------+---------------+---------+-----------+----------+-------------------+ FV Prox  Full                                                             +---------+---------------+---------+-----------+----------+-------------------+ FV Mid   Full                                                             +---------+---------------+---------+-----------+----------+-------------------+  FV Distal               Yes      Yes                                      +---------+---------------+---------+-----------+----------+-------------------+ PFV      Full                                                             +---------+---------------+---------+-----------+----------+-------------------+ POP      Full           Yes      Yes                                      +---------+---------------+---------+-----------+----------+-------------------+ PTV      Full                                                             +---------+---------------+---------+-----------+----------+-------------------+  PERO                                                  Patency shown with                                                        color doppler       +---------+---------------+---------+-----------+----------+-------------------+   +---------+---------------+---------+-----------+----------+--------------+ LEFT     CompressibilityPhasicitySpontaneityPropertiesThrombus Aging +---------+---------------+---------+-----------+----------+--------------+ CFV      Full           Yes      Yes                                 +---------+---------------+---------+-----------+----------+--------------+ SFJ      Full                                                        +---------+---------------+---------+-----------+----------+--------------+ FV Prox  Full                                                        +---------+---------------+---------+-----------+----------+--------------+ FV Mid   Full                                                        +---------+---------------+---------+-----------+----------+--------------+ FV Distal               Yes      Yes                                 +---------+---------------+---------+-----------+----------+--------------+ PFV      Full                                                         +---------+---------------+---------+-----------+----------+--------------+ POP      Full           Yes      Yes                                 +---------+---------------+---------+-----------+----------+--------------+ PTV      Full                                                        +---------+---------------+---------+-----------+----------+--------------+  PERO     Full                                                        +---------+---------------+---------+-----------+----------+--------------+     Summary: RIGHT: - There is no evidence of deep vein thrombosis in the lower extremity. However, portions of this examination were limited- see technologist comments above.  - No cystic structure found in the popliteal fossa.  LEFT: - There is no evidence of deep vein thrombosis in the lower extremity. However, portions of this examination were limited- see technologist comments above.  - No cystic structure found in the popliteal fossa.  *See table(s) above for measurements and observations. Electronically signed by Jamelle Haring on 12/11/2021 at 4:52:09 PM.    Final    VAS US CAROTID  Result Date: 12/09/2021 Carotid Arterial Duplex Study Patient Name:  Valerie Mcclure  Date of Exam:   12/09/2021 Medical Rec #: [id number]            Accession #:    [id number] Date of Birth: [birth year]            Patient Gender: F Patient Age:   42 years Exam Location:  [facility] Procedure:      VAS US CAROTID Referring Phys: Babs Bertin --------------------------------------------------------------------------------  Indications:       TIA. Risk Factors:      Hypertension, hyperlipidemia, no history of smoking, prior                    CVA. Comparison Study:  11-29-2016 CTA Neck showed no evidence of stenosis of right                    carotid and 25% or less of left carotid. Performing Technologist: Darlin Coco RDMS, RVT  Examination Guidelines: A complete evaluation includes  B-mode imaging, spectral Doppler, color Doppler, and power Doppler as needed of all accessible portions of each vessel. Bilateral testing is considered an integral part of a complete examination. Limited examinations for reoccurring indications may be performed as noted.  Right Carotid Findings: +----------+-------+-------+--------+------------------------+-----------------+           PSV    EDV    StenosisPlaque Description      Comments                    cm/s   cm/s                                                     +----------+-------+-------+--------+------------------------+-----------------+ CCA Prox  79     9                                                        +----------+-------+-------+--------+------------------------+-----------------+ CCA Distal59     7  intimal                                                                   thickening        +----------+-------+-------+--------+------------------------+-----------------+ ICA Prox  62     16     1-39%   focal, smooth and                                                         calcific                                  +----------+-------+-------+--------+------------------------+-----------------+ ICA Distal88     18                                                       +----------+-------+-------+--------+------------------------+-----------------+ ECA       109                                                             +----------+-------+-------+--------+------------------------+-----------------+ +----------+--------+-------+----------------+-------------------+           PSV cm/sEDV cmsDescribe        Arm Pressure (mmHG) +----------+--------+-------+----------------+-------------------+ [id number]            Multiphasic, WNL                    +----------+--------+-------+----------------+-------------------+  +---------+--------+--+--------+-+---------+ VertebralPSV cm/s62EDV cm/s7Antegrade +---------+--------+--+--------+-+---------+  Left Carotid Findings: +----------+--------+--------+--------+-------------------+--------+           PSV cm/sEDV cm/sStenosisPlaque Description Comments +----------+--------+--------+--------+-------------------+--------+ CCA Prox  71      13                                          +----------+--------+--------+--------+-------------------+--------+ CCA Distal64      10                                          +----------+--------+--------+--------+-------------------+--------+ ICA Prox  57      12      1-39%   smooth and calcific         +----------+--------+--------+--------+-------------------+--------+ ICA Distal64      17                                          +----------+--------+--------+--------+-------------------+--------+ ECA       99                                                  +----------+--------+--------+--------+-------------------+--------+ +----------+--------+--------+----------------+-------------------+  PSV cm/sEDV cm/sDescribe        Arm Pressure (mmHG) +----------+--------+--------+----------------+-------------------+ Subclavian171             Multiphasic, WNL                    +----------+--------+--------+----------------+-------------------+ +---------+--------+--+--------+-+---------+ VertebralPSV cm/s67EDV cm/s8Antegrade +---------+--------+--+--------+-+---------+   Summary: Right Carotid: Velocities in the right ICA are consistent with a 1-39% stenosis. Left Carotid: Velocities in the left ICA are consistent with a 1-39% stenosis. Vertebrals:  Bilateral vertebral arteries demonstrate antegrade flow. Subclavians: Normal flow hemodynamics were seen in bilateral subclavian              arteries. *See table(s) above for measurements and observations.  Electronically signed by  Servando Snare MD on 12/09/2021 at 5:29:32 PM.    Final    ECHOCARDIOGRAM COMPLETE  Result Date: 12/09/2021    ECHOCARDIOGRAM REPORT   Patient Name:   Valerie Mcclure Date of Exam: 12/09/2021 Medical Rec #:  [id number]           Height:       66.0 in Accession #:    [id number]          Weight:       167.8 lb Date of Birth:  [birth year]           BSA:          1.856 m Patient Age:    50 years            BP:           116/64 mmHg Patient Gender: F                   HR:           81 bpm. Exam Location:  Inpatient Procedure: 2D Echo, Cardiac Doppler and Color Doppler Indications:    Syncope  History:        Patient has prior history of Echocardiogram examinations, most                 recent 12/02/2016. Stroke, Arrythmias:Atrial Fibrillation; Risk                 Factors:Hypertension and Dyslipidemia.  Sonographer:    [place] Referring Phys: [id number] [place]  1. Left ventricular ejection fraction, by estimation, is 65 to 70%. The left ventricle has normal function. The left ventricle has no regional wall motion abnormalities. Left ventricular diastolic parameters are indeterminate.  2. Right ventricular systolic function is normal. The right ventricular size is mildly enlarged. There is mildly elevated pulmonary artery systolic pressure. The estimated right ventricular systolic pressure is 73.4 mmHg.  3. Left atrial size was mildly dilated.  4. The mitral valve is grossly normal. No evidence of mitral valve regurgitation.  5. The aortic valve is tricuspid. There is mild thickening of the aortic valve. Aortic valve regurgitation is moderate. No aortic stenosis is present. Aortic regurgitation PHT measures 460 msec.  6. Aortic dilatation noted. There is borderline dilatation of the ascending aorta, measuring 39 mm. Comparison(s): AI has increased from prior report; unable to open 2018 study. FINDINGS  Left Ventricle: Left ventricular ejection fraction, by estimation, is 65 to 70%. The left  ventricle has normal function. The left ventricle has no regional wall motion abnormalities. The left ventricular internal cavity size was normal in size. There is  no left ventricular hypertrophy. Left ventricular diastolic parameters are indeterminate. Right Ventricle: The right ventricular size  is mildly enlarged. No increase in right ventricular wall thickness. Right ventricular systolic function is normal. There is mildly elevated pulmonary artery systolic pressure. The tricuspid regurgitant velocity is 2.99 m/s, and with an assumed right atrial pressure of 3 mmHg, the estimated right ventricular systolic pressure is 41.5 mmHg. Left Atrium: Left atrial size was mildly dilated. Right Atrium: Right atrial size was normal in size. Pericardium: There is no evidence of pericardial effusion. Mitral Valve: The mitral valve is grossly normal. No evidence of mitral valve regurgitation. Tricuspid Valve: The tricuspid valve is normal in structure. Tricuspid valve regurgitation is mild . No evidence of tricuspid stenosis. Aortic Valve: The aortic valve is tricuspid. There is mild thickening of the aortic valve. Aortic valve regurgitation is moderate. Aortic regurgitation PHT measures 460 msec. No aortic stenosis is present. Aortic valve mean gradient measures 5.5 mmHg. Aortic valve peak gradient measures 12.0 mmHg. Aortic valve area, by VTI measures 1.66 cm. Pulmonic Valve: The pulmonic valve was normal in structure. Pulmonic valve regurgitation is trivial. No evidence of pulmonic stenosis. Aorta: Aortic dilatation noted. There is borderline dilatation of the ascending aorta, measuring 39 mm. IAS/Shunts: No atrial level shunt detected by color flow Doppler.  LEFT VENTRICLE PLAX 2D LVIDd:         4.00 cm LVIDs:         2.70 cm LV PW:         1.10 cm LV IVS:        1.00 cm LVOT diam:     1.70 cm LV SV:         55 LV SV Index:   30 LVOT Area:     2.27 cm  LV Volumes (MOD) LV vol d, MOD A2C: 60.2 ml LV vol d, MOD A4C: 59.4  ml LV vol s, MOD A2C: 10.6 ml LV vol s, MOD A4C: 23.5 ml LV SV MOD A2C:     49.6 ml LV SV MOD A4C:     59.4 ml LV SV MOD BP:      42.8 ml RIGHT VENTRICLE RV Basal diam:  4.10 cm RV Mid diam:    2.70 cm RV S prime:     14.70 cm/s TAPSE (M-mode): 2.8 cm LEFT ATRIUM             Index        RIGHT ATRIUM           Index LA diam:        2.80 cm 1.51 cm/m   RA Area:     16.10 cm LA Vol (A2C):   69.0 ml 37.18 ml/m  RA Volume:   36.40 ml  19.61 ml/m LA Vol (A4C):   74.2 ml 39.98 ml/m LA Biplane Vol: 77.8 ml 41.92 ml/m  AORTIC VALVE                     PULMONIC VALVE AV Area (Vmax):    1.57 cm      PV Vmax:          1.16 m/s AV Area (Vmean):   1.68 cm      PV Peak grad:     5.4 mmHg AV Area (VTI):     1.66 cm      PR End Diast Vel: 5.86 msec AV Vmax:           173.00 cm/s AV Vmean:          106.500 cm/s AV VTI:  0.333 m AV Peak Grad:      12.0 mmHg AV Mean Grad:      5.5 mmHg LVOT Vmax:         120.00 cm/s LVOT Vmean:        79.000 cm/s LVOT VTI:          0.244 m LVOT/AV VTI ratio: 0.73 AI PHT:            460 msec  AORTA Ao Root diam: 3.10 cm Ao Asc diam:  3.80 cm MR Peak grad: 59.9 mmHg     TRICUSPID VALVE MR Vmax:      387.00 cm/s   TR Peak grad:   35.8 mmHg MV E velocity: 102.00 cm/s  TR Vmax:        299.00 cm/s                              SHUNTS                             Systemic VTI:  0.24 m                             Systemic Diam: 1.70 cm Rudean Haskell MD Electronically signed by Rudean Haskell MD Signature Date/Time: 12/09/2021/1:26:47 PM    Final    DG Abd Portable 1V  Result Date: 12/09/2021 CLINICAL DATA:  Abdominal discomfort. EXAM: PORTABLE ABDOMEN - 1 VIEW COMPARISON:  No abnormal abdominal or pelvic calcifications. FINDINGS: Enteric contrast material opacifies nondilated loops of small bowel. The bowel gas pattern appears nonobstructed. Postop change is again noted within the lower lumbar spine. IMPRESSION: Nonobstructive bowel gas pattern. Electronically Signed   By:  Kerby Moors M.D.   On: 12/09/2021 11:49   DG Swallowing Func-Speech Pathology  Result Date: 12/09/2021 Table formatting from the original result was not included. Objective Swallowing Evaluation: Type of Study: MBS-Modified Barium Swallow Study  Patient Details Name: Valerie Mcclure MRN: [id number] Date of Birth: 1940/09/07 Today's Date: 12/09/2021 Time: SLP Start Time (ACUTE ONLY): 4627 -SLP Stop Time (ACUTE ONLY): 0930 SLP Time Calculation (min) (ACUTE ONLY): 16 min Past Medical History: Past Medical History: Diagnosis Date  Blood transfusion without reported diagnosis   Chronic low back pain   History of echocardiogram   Echo 5/18: EF 55-60, Gr 2 DD, mild MR, mild LAE, PASP 47 // Echo 1/18:  EF 55, mild AI, MAC, mild MR, mild LAE, trivial pericardial effusion  History of ischemic left MCA stroke 11/2016  left MCA infarct status post mechanical thrombectomy complicated by large left basal ganglia hemorrhage with right shift.  HTN (hypertension)   Hyperlipidemia   Hypothyroidism   Osteopenia   Persistent atrial fibrillation ([name])   CHADS2-VASc=6 (female, age 89, HTN, CVA) // Apixaban // rate control strategy   Sacral wound   chronic sacral wound Past Surgical History: Past Surgical History: Procedure Laterality Date  ABDOMINAL HYSTERECTOMY  1970  CHOLECYSTECTOMY  07/2009  Dr. Rise Patience  COLONOSCOPY  2003  FLEXIBLE SIGMOIDOSCOPY  2010  HAND SURGERY    IR ANGIO INTRA EXTRACRAN SEL COM CAROTID INNOMINATE UNI L MOD SED  11/29/2016  IR ANGIO VERTEBRAL SEL SUBCLAVIAN INNOMINATE UNI R MOD SED  11/29/2016  IR PERCUTANEOUS ART THROMBECTOMY/INFUSION INTRACRANIAL INC DIAG ANGIO  11/29/2016  IR RADIOLOGIST EVAL & MGMT  03/24/2017  LUMBAR LAMINECTOMY  11/2008  Done by Dr. Patrice Paradise  RADIOLOGY WITH ANESTHESIA N/A 11/29/2016  Procedure: RADIOLOGY WITH ANESTHESIA;  Surgeon: Radiologist, Medication, MD;  Location: [facility];  Service: Radiology;  Laterality: N/A;  THYROIDECTOMY   HPI: Per MD report "Valerie Mcclure is a 81 y.o. female  with medical history significant for persistent atrial fibrillation chronically anticoagulated on Eliquis, chronic diastolic heart failure hypertension, hyperlipidemia, chronic sacral wound, acquired hypothyroidism, stage IV CKD with baseline creatinine 1.7-2.0, who is admitted to [facility] on 12/08/2021 with syncope, right facial droop and dysarthria.  MRI negative and CXR negative.  Pt did not pass [facility] swallow screen.  Evaluation ordered.  Pt with PMH + for left MCA CVA in 2018.  Subjective: pt awake in chair  Recommendations for follow up therapy are one component of a multi-disciplinary discharge planning process, led by the attending physician.  Recommendations may be updated based on patient status, additional functional criteria and insurance authorization. Assessment / Plan / Recommendation   12/09/2021   9:55 AM Clinical Impressions Clinical Impression Pt presents with mild oropharyngeal dysphagia c/b discoordination with oral transiting due to lingual weakness.  Pharyngeal swallow initiation with liquids was to pyriform sinus contributing to inconsistent laryngeal penetration of thin liquid *and ultra thin barium.  Pt did not cough during MBS as observed during clinical swallow evaluation.  Pharyngeal swallow is strong without retention fortunately.  She did not orally transit barium tablet with thin liquids despite 3 attempts - resulting in premature spillage of liquid into pharynx and penetration.  Pt required applesauce to transit masticated tablet.  Use of straw placed on left side improved oral transiting control.      Chin tuck not tested as not indicated and pt admits to difficulty with head ROM.  Upon esophageal sweep, pt appears with retention of tablet particles - recommend strict esophageal precautions including sitting upright after intake.  Suspect pt has an acute on chronic dysphagia per her report of occasional cough with po and reflux.  Using teach back with live video, educated pt  to recommendations/precautions.  Will follow up x1 due to pt's dysphagia for pt/family education and po tolerance. SLP Visit Diagnosis Dysphagia, oral phase (R13.11) Impact on safety and function Mild aspiration risk     12/09/2021   9:55 AM Treatment Recommendations Treatment Recommendations Therapy as outlined in treatment plan below     12/09/2021   9:56 AM Prognosis Prognosis for Safe Diet Advancement Good   12/09/2021   9:55 AM Diet Recommendations SLP Diet Recommendations Dysphagia 3 (Mech soft) solids;Thin liquid Liquid Administration via Cup;Straw Medication Administration Whole meds with puree Compensations Slow rate;Small sips/bites Postural Changes Remain semi-upright after after feeds/meals (Comment);Seated upright at 90 degrees     12/09/2021   9:55 AM Other Recommendations Oral Care Recommendations Oral care BID Follow Up Recommendations No SLP follow up Assistance recommended at discharge Intermittent Supervision/Assistance Functional Status Assessment Patient has had a recent decline in their functional status and demonstrates the ability to make significant improvements in function in a reasonable and predictable amount of time.   12/09/2021   9:55 AM Frequency and Duration  Speech Therapy Frequency (ACUTE ONLY) min 1 x/week Treatment Duration 1 week     12/09/2021   9:52 AM Oral Phase Oral Phase Impaired Oral - Nectar Cup Decreased bolus cohesion;Premature spillage Oral - Thin Cup Decreased bolus cohesion;Premature spillage Oral - Thin Straw Decreased bolus cohesion;Premature spillage Oral - Puree WFL Oral - Mech Soft WFL Oral - Pill Decreased bolus cohesion;Delayed  oral transit Oral Phase - Comment pt masticated tablet after being unable to transit with liquids x3 attempts, used puree to orally transit    12/09/2021   9:54 AM Pharyngeal Phase Pharyngeal Phase Impaired Pharyngeal- Nectar Cup Delayed swallow initiation-pyriform sinuses Pharyngeal Material does not enter airway Pharyngeal- Thin Cup Delayed  swallow initiation-pyriform sinuses;Penetration/Aspiration during swallow;Pharyngeal residue - pyriform Pharyngeal Material enters airway, remains ABOVE vocal cords and not ejected out Pharyngeal- Thin Straw Delayed swallow initiation-pyriform sinuses;Pharyngeal residue - pyriform Pharyngeal Material enters airway, remains ABOVE vocal cords and not ejected out;Material enters airway, remains ABOVE vocal cords then ejected out Pharyngeal- Puree WFL;Delayed swallow initiation-vallecula Pharyngeal Material does not enter airway Pharyngeal- Mechanical Soft Delayed swallow initiation-vallecula;WFL Pharyngeal Material does not enter airway Pharyngeal- Pill WFL;Delayed swallow initiation-vallecula Pharyngeal Material does not enter airway    12/09/2021   9:55 AM Cervical Esophageal Phase  Cervical Esophageal Phase WFL Kathleen Lime, MS [facility] SLP Acute Rehab Services Office [phone] Pager [phone] Macario Golds 12/09/2021, 10:01 AM                     MR BRAIN WO CONTRAST  Result Date: 12/08/2021 CLINICAL DATA:  Right-sided facial droop, weakness, slurred speech, stroke suspected EXAM: MRI HEAD WITHOUT CONTRAST MRA HEAD WITHOUT CONTRAST TECHNIQUE: Multiplanar, multi-echo pulse sequences of the brain and surrounding structures were acquired without intravenous contrast. Angiographic images of the Circle of Willis were acquired using MRA technique without intravenous contrast. COMPARISON:  MRI head 07/08/2019, MRA head 07/13/2017, correlation is also made with CT head 12/08/2021 FINDINGS: MRI HEAD FINDINGS Brain: No restricted diffusion to suggest acute or subacute infarct. No acute hemorrhage, mass, mass effect, or midline shift. No hydrocephalus or extra-axial collection. Redemonstrated sequela of remote left MCA infarct involving the left basal ganglia, left temporal lobe, and left insula, with unchanged ex vacuo dilatation of the left lateral ventricle and associated wallerian degeneration in the left cerebral  peduncle. Additional T2 hyperintensity in the pons is favored to be the sequela of chronic small vessel ischemic disease. Vascular: Normal arterial flow voids. Skull and upper cervical spine: Normal marrow signal. Sinuses/Orbits: No acute finding. Other: Trace fluid in the mastoid air cells. MRA HEAD FINDINGS Evaluation is somewhat limited by motion artifact. Anterior circulation: Both internal carotid arteries are patent to the termini, without significant stenosis. A1 segments patent. Normal anterior communicating artery. Anterior cerebral arteries are patent to their distal aspects, with apparent discontinuity in the mid ACAs likely related to motion. No M1 stenosis or occlusion. Normal MCA bifurcations. Distal MCA branches perfused and symmetric, with evaluation of the most distal aspects limited by motion. Previous suspected M2 stenosis may have been secondary to motion artifact on that study. Posterior circulation: Vertebral arteries patent to the vertebrobasilar junction without stenosis. Posterior inferior cerebral arteries patent bilaterally. Basilar patent to its distal aspect. Superior cerebellar arteries patent bilaterally. Patent right P1. The left P1 is not definitively visualized, with fetal or near fetal origin of the left PCA from a patent left posterior communicating artery. The right posterior communicating artery is not visualized. PCAs perfused to their distal aspects without stenosis. Anatomic variants: Fetal or near fetal origin of the left PCA. IMPRESSION: 1. No acute intracranial process. No evidence of acute or subacute infarct. 2. Evaluation is mildly limited by motion artifact. Within that limitation, no intracranial large vessel occlusion or significant stenosis. Electronically Signed   By: Merilyn Baba M.D.   On: 12/08/2021 23:19   MR ANGIO HEAD WO  CONTRAST  Result Date: 12/08/2021 CLINICAL DATA:  Right-sided facial droop, weakness, slurred speech, stroke suspected EXAM: MRI HEAD  WITHOUT CONTRAST MRA HEAD WITHOUT CONTRAST TECHNIQUE: Multiplanar, multi-echo pulse sequences of the brain and surrounding structures were acquired without intravenous contrast. Angiographic images of the Circle of Willis were acquired using MRA technique without intravenous contrast. COMPARISON:  MRI head 07/08/2019, MRA head 07/13/2017, correlation is also made with CT head 12/08/2021 FINDINGS: MRI HEAD FINDINGS Brain: No restricted diffusion to suggest acute or subacute infarct. No acute hemorrhage, mass, mass effect, or midline shift. No hydrocephalus or extra-axial collection. Redemonstrated sequela of remote left MCA infarct involving the left basal ganglia, left temporal lobe, and left insula, with unchanged ex vacuo dilatation of the left lateral ventricle and associated wallerian degeneration in the left cerebral peduncle. Additional T2 hyperintensity in the pons is favored to be the sequela of chronic small vessel ischemic disease. Vascular: Normal arterial flow voids. Skull and upper cervical spine: Normal marrow signal. Sinuses/Orbits: No acute finding. Other: Trace fluid in the mastoid air cells. MRA HEAD FINDINGS Evaluation is somewhat limited by motion artifact. Anterior circulation: Both internal carotid arteries are patent to the termini, without significant stenosis. A1 segments patent. Normal anterior communicating artery. Anterior cerebral arteries are patent to their distal aspects, with apparent discontinuity in the mid ACAs likely related to motion. No M1 stenosis or occlusion. Normal MCA bifurcations. Distal MCA branches perfused and symmetric, with evaluation of the most distal aspects limited by motion. Previous suspected M2 stenosis may have been secondary to motion artifact on that study. Posterior circulation: Vertebral arteries patent to the vertebrobasilar junction without stenosis. Posterior inferior cerebral arteries patent bilaterally. Basilar patent to its distal aspect. Superior  cerebellar arteries patent bilaterally. Patent right P1. The left P1 is not definitively visualized, with fetal or near fetal origin of the left PCA from a patent left posterior communicating artery. The right posterior communicating artery is not visualized. PCAs perfused to their distal aspects without stenosis. Anatomic variants: Fetal or near fetal origin of the left PCA. IMPRESSION: 1. No acute intracranial process. No evidence of acute or subacute infarct. 2. Evaluation is mildly limited by motion artifact. Within that limitation, no intracranial large vessel occlusion or significant stenosis. Electronically Signed   By: Merilyn Baba M.D.   On: 12/08/2021 23:19   DG Chest Portable 1 View  Result Date: 12/08/2021 CLINICAL DATA:  Sepsis EXAM: PORTABLE CHEST 1 VIEW COMPARISON:  11/12/2021 FINDINGS: Transverse diameter of heart is in the upper limits of normal. There are no signs of pulmonary edema or focal pulmonary consolidation. There is interval clearing of small linear densities at the left cardiophrenic angle. There is no significant pleural effusion or pneumothorax. IMPRESSION: No active disease. Electronically Signed   By: Elmer Picker M.D.   On: 12/08/2021 18:09   CT HEAD CODE STROKE WO CONTRAST  Result Date: 12/08/2021 CLINICAL DATA:  Code stroke. Neuro deficit, acute, stroke suspected. Right-sided facial droop, weakness, slurred speech. EXAM: CT HEAD WITHOUT CONTRAST TECHNIQUE: Contiguous axial images were obtained from the base of the skull through the vertex without intravenous contrast. RADIATION DOSE REDUCTION: This exam was performed according to the departmental dose-optimization program which includes automated exposure control, adjustment of the mA and/or kV according to patient size and/or use of iterative reconstruction technique. COMPARISON:  Brain MRI 07/08/2019.  Head CT 07/12/2017. FINDINGS: Brain: No age advanced or lobar predominant parenchymal atrophy. Redemonstrated  chronic left MCA territory infarct within the left temporal lobe,  left insula and left basal ganglia. Chronic small vessel ischemic changes within the cerebral white matter, better appreciated on the prior brain MRI of 07/08/2019. There is no acute intracranial hemorrhage. No acute demarcated cortical infarct. No extra-axial fluid collection. No evidence of an intracranial mass. No midline shift. Vascular: No hyperdense vessel.  Atherosclerotic calcifications. Skull: Normal. Negative for fracture or focal lesion. Sinuses/Orbits: No mass or acute finding within the imaged orbits. Minimal frothy secretions within the left maxillary sinus at the imaged levels. ASPECTS [facility] Stroke Program Early CT Score) - Ganglionic level infarction (caudate, lentiform nuclei, internal capsule, insula, M1-M3 cortex): 7 - Supraganglionic infarction (M4-M6 cortex): 3 Total score (0-10 with 10 being normal): 10 (when discounting chronic infarcts). No acute intracranial findings. These results were communicated to Dr. Theda Sers at 4:40 pmon 5/22/2023by text page via the [facility] messaging system. IMPRESSION: No evidence of acute intracranial abnormality. Redemonstrated chronic left MCA territory infarct within the left temporal lobe, insula and basal ganglia. Chronic small-vessel ischemic changes within the pons, better appreciated on the prior brain MRI of 07/08/2019. Mild left maxillary sinusitis. Electronically Signed   By: Kellie Simmering D.O.   On: 12/08/2021 16:42      Subjective: Patient seen and examined at bedside this morning.  Hemodynamically stable for discharge today.  Discharge Exam: Vitals:   01/03/22 0542 01/03/22 0940  BP: (!) 143/51 (!) 137/54  Pulse: 70 76  Resp: 18 17  Temp: 98 F (36.7 C) (!) 97.5 F (36.4 C)  SpO2: 100% 100%   Vitals:   01/02/22 0958 01/02/22 2054 01/03/22 0542 01/03/22 0940  BP: (!) 134/55 (!) 142/59 (!) 143/51 (!) 137/54  Pulse: 73 79 70 76  Resp: _0 Temp: (!) 97.4 F  (36.3 C) 98.6 F (37 C) 98 F (36.7 C) (!) 97.5 F (36.4 C)  TempSrc: Oral Oral Oral Oral  SpO2: 97% 100% 100% 100%    General: Pt is alert, awake, not in acute distress Cardiovascular: RRR, S1/S2 +, no rubs, no gallops Respiratory: CTA bilaterally, no wheezing, no rhonchi Abdominal: Soft, NT, ND, bowel sounds + Extremities: trace lower extremity edema, no cyanosis    The results of significant diagnostics from this hospitalization (including imaging, microbiology, ancillary and laboratory) are listed below for reference.     Microbiology: No results found for this or any previous visit (from the past 240 hour(s)).   Labs: BNP (last 3 results) Recent Labs    12/13/21 0333  BNP 5,974.1*   Basic Metabolic Panel: Recent Labs  Lab 12/31/21 2001 01/01/22 0515 01/02/22 0301 01/03/22 0320  NA 136 136 139 135  K 3.6 3.1* 3.0* 4.2  CL 101 105 108 106  CO2 _1 19*  GLUCOSE 180* 121* 100* 121*  BUN 24* _2 CREATININE 2.16* 1.98* 1.78* 1.54*  CALCIUM 8.2* 7.2* 7.5* 8.0*  MG 1.8  --   --   --    Liver Function Tests: Recent Labs  Lab 12/31/21 2001 01/01/22 0515  AST 20 12*  ALT 12 10  ALKPHOS 78 66  BILITOT 0.9 0.6  PROT 5.8* 4.8*  ALBUMIN 2.5* 2.0*   No results for input(s): "LIPASE", "AMYLASE" in the last 168 hours. No results for input(s): "AMMONIA" in the last 168 hours. CBC: Recent Labs  Lab 12/31/21 2001 01/01/22 0515 01/02/22 0301 01/03/22 0320  WBC 11.8* 18.2* 9.1 8.0  NEUTROABS 9.8*  --   --   --   HGB 9.3* 7.9*  7.8* 8.9*  HCT 29.6* 25.8* 24.2* 27.5*  MCV 97.4 97.0 95.3 94.5  PLT 304 251 222 251   Cardiac Enzymes: No results for input(s): "CKTOTAL", "CKMB", "CKMBINDEX", "TROPONINI" in the last 168 hours. BNP: Invalid input(s): "POCBNP" CBG: Recent Labs  Lab 12/31/21 1952 01/03/22 0842  GLUCAP 174* 153*   D-Dimer No results for input(s): "DDIMER" in the last 72 hours. Hgb A1c No results for input(s): "HGBA1C" in the last  72 hours. Lipid Profile No results for input(s): "CHOL", "HDL", "LDLCALC", "TRIG", "CHOLHDL", "LDLDIRECT" in the last 72 hours. Thyroid function studies Recent Labs    12/31/21 2001  TSH 5.183*   Anemia work up No results for input(s): "VITAMINB12", "FOLATE", "FERRITIN", "TIBC", "IRON", "RETICCTPCT" in the last 72 hours. Urinalysis    Component Value Date/Time   COLORURINE YELLOW 12/31/2021 2331   APPEARANCEUR CLOUDY (A) 12/31/2021 2331   LABSPEC 1.006 12/31/2021 2331   PHURINE 7.0 12/31/2021 2331   GLUCOSEU NEGATIVE 12/31/2021 2331   GLUCOSEU NEGATIVE 11/18/2010 0859   HGBUR SMALL (A) 12/31/2021 2331   HGBUR negative 12/13/2008 0944   BILIRUBINUR NEGATIVE 12/31/2021 2331   KETONESUR NEGATIVE 12/31/2021 2331   PROTEINUR 100 (A) 12/31/2021 2331   UROBILINOGEN 0.2 11/18/2010 0859   NITRITE POSITIVE (A) 12/31/2021 2331   LEUKOCYTESUR LARGE (A) 12/31/2021 2331   Sepsis Labs Recent Labs  Lab 12/31/21 2001 01/01/22 0515 01/02/22 0301 01/03/22 0320  WBC 11.8* 18.2* 9.1 8.0   Microbiology No results found for this or any previous visit (from the past 240 hour(s)).  Please note: You were cared for by a hospitalist during your hospital stay. Once you are discharged, your primary care physician will handle any further medical issues. Please note that NO REFILLS for any discharge medications will be authorized once you are discharged, as it is imperative that you return to your primary care physician (or establish a relationship with a primary care physician if you do not have one) for your post hospital discharge needs so that they can reassess your need for medications and monitor your lab values.    Time coordinating discharge: 40 minutes  SIGNED:   Shelly Coss, MD  [facility] 01/03/2022, 10:40 AM Pager [phone]  If 7PM-7AM, please contact night-coverage [email or web address] Password [email or web address]

## 2022-01-03 NOTE — Progress Notes (Addendum)
OT Cancellation Note  Patient Details Name: Valerie Mcclure MRN: [id number] DOB: [birth year]   Cancelled Treatment:    Reason Eval/Treat Not Completed: Other (comment) Pt politely declined OT today due to pending d/c which was confirmed by nursing staff  Britt Bottom 01/03/2022, 11:37 AM

## 2022-01-03 NOTE — Progress Notes (Signed)
DISCHARGE NOTE HOME KERIANA SARSFIELD to be discharged Home per MD order. Discussed prescriptions and follow up appointments with the patient. Prescriptions given to patient; medication list explained in detail. Patient verbalized understanding.  Skin clean, dry and intact without evidence of skin break down, no evidence of skin tears noted. IV catheter discontinued intact. Site without signs and symptoms of complications. Dressing and pressure applied. Pt denies pain at the site currently. No complaints noted.  Patient free of lines, drains, and wounds.   An After Visit Summary (AVS) was printed and given to the patient. Patient escorted via wheelchair, and discharged home via private auto.  Shevon Sian S Zephaniah Enyeart, RN

## 2022-01-05 ENCOUNTER — Telehealth: Payer: Self-pay

## 2022-01-05 ENCOUNTER — Telehealth: Payer: Self-pay | Admitting: Internal Medicine

## 2022-01-05 MED ORDER — TAMSULOSIN HCL 0.4 MG PO CAPS: 0.4000 mg | ORAL_CAPSULE | Freq: Every day | ORAL | 0 refills | Status: DC

## 2022-01-05 NOTE — Telephone Encounter (Signed)
I would continue it for now - we can discuss further at her hospital f/u visit

## 2022-01-05 NOTE — Telephone Encounter (Signed)
Spoke with patient's daughter and states that her mother will continue the tamsulosin and she is in need of a refill.

## 2022-01-05 NOTE — Telephone Encounter (Signed)
Transition Care Management Follow-up Telephone Call Date of discharge and from where: [facility] 01-03-22 Dx: Syncope and collapse How have you been since you were released from the hospital? Doing well  Any questions or concerns? No  Items Reviewed: Did the pt receive and understand the discharge instructions provided? Yes  Medications obtained and verified? Yes  Other? No  Any new allergies since your discharge? No  Dietary orders reviewed? Yes Do you have support at home? Yes   Home Care and Equipment/Supplies: Were home health services ordered? no If so, what is the name of the agency? [facility] will restart care   Has the agency set up a time to come to the patient's home? no Were any new equipment or medical supplies ordered?  No What is the name of the medical supply agency? [facility] Were you able to get the supplies/equipment? not applicable Do you have any questions related to the use of the equipment or supplies? No  Functional Questionnaire: (I = Independent and D = Dependent) ADLs: I  Bathing/Dressing- I  Meal Prep- I  Eating- I  Maintaining continence- I  Transferring/Ambulation- I  Managing Meds- D  Follow up appointments reviewed:  PCP Hospital f/u appt confirmed? Yes  Scheduled to see Dr Quay Burow on 01-09-22 @ [facility] f/u appt confirmed? No  . Are transportation arrangements needed? No  If their condition worsens, is the pt aware to call PCP or go to the Emergency Dept.? Yes Was the patient provided with contact information for the PCP's office or ED? Yes Was to pt encouraged to call back with questions or concerns? Yes

## 2022-01-05 NOTE — Telephone Encounter (Signed)
Pt daughter called in and said the hospital prescribed tamsulosin (FLOMAX) 0.4 MG CAPS capsule and she would like to know if her mom should take the medication.  Requesting a callback.

## 2022-01-07 ENCOUNTER — Telehealth: Payer: Self-pay | Admitting: Internal Medicine

## 2022-01-07 DIAGNOSIS — D631 Anemia in chronic kidney disease: Secondary | ICD-10-CM | POA: Diagnosis not present

## 2022-01-07 DIAGNOSIS — I69351 Hemiplegia and hemiparesis following cerebral infarction affecting right dominant side: Secondary | ICD-10-CM | POA: Diagnosis not present

## 2022-01-07 DIAGNOSIS — M545 Low back pain, unspecified: Secondary | ICD-10-CM | POA: Diagnosis not present

## 2022-01-07 DIAGNOSIS — E785 Hyperlipidemia, unspecified: Secondary | ICD-10-CM | POA: Diagnosis not present

## 2022-01-07 DIAGNOSIS — Z7901 Long term (current) use of anticoagulants: Secondary | ICD-10-CM | POA: Diagnosis not present

## 2022-01-07 DIAGNOSIS — N184 Chronic kidney disease, stage 4 (severe): Secondary | ICD-10-CM | POA: Diagnosis not present

## 2022-01-07 DIAGNOSIS — I5032 Chronic diastolic (congestive) heart failure: Secondary | ICD-10-CM | POA: Diagnosis not present

## 2022-01-07 DIAGNOSIS — I951 Orthostatic hypotension: Secondary | ICD-10-CM | POA: Diagnosis not present

## 2022-01-07 DIAGNOSIS — G8929 Other chronic pain: Secondary | ICD-10-CM | POA: Diagnosis not present

## 2022-01-07 DIAGNOSIS — E1122 Type 2 diabetes mellitus with diabetic chronic kidney disease: Secondary | ICD-10-CM | POA: Diagnosis not present

## 2022-01-07 DIAGNOSIS — I4892 Unspecified atrial flutter: Secondary | ICD-10-CM | POA: Diagnosis not present

## 2022-01-07 DIAGNOSIS — D696 Thrombocytopenia, unspecified: Secondary | ICD-10-CM | POA: Diagnosis not present

## 2022-01-07 DIAGNOSIS — I4819 Other persistent atrial fibrillation: Secondary | ICD-10-CM | POA: Diagnosis not present

## 2022-01-07 DIAGNOSIS — E039 Hypothyroidism, unspecified: Secondary | ICD-10-CM | POA: Diagnosis not present

## 2022-01-07 DIAGNOSIS — Z9181 History of falling: Secondary | ICD-10-CM | POA: Diagnosis not present

## 2022-01-07 DIAGNOSIS — I130 Hypertensive heart and chronic kidney disease with heart failure and stage 1 through stage 4 chronic kidney disease, or unspecified chronic kidney disease: Secondary | ICD-10-CM | POA: Diagnosis not present

## 2022-01-07 DIAGNOSIS — R1312 Dysphagia, oropharyngeal phase: Secondary | ICD-10-CM | POA: Diagnosis not present

## 2022-01-07 DIAGNOSIS — F32A Depression, unspecified: Secondary | ICD-10-CM | POA: Diagnosis not present

## 2022-01-07 NOTE — Telephone Encounter (Signed)
Ok for orders? 

## 2022-01-07 NOTE — Telephone Encounter (Signed)
Home health nurse called to get verbal orders for home visits 2x a week 4 weeks. Please call katherine to confirm   Belenda Cruise: [phone]

## 2022-01-08 ENCOUNTER — Telehealth: Payer: Self-pay | Admitting: Internal Medicine

## 2022-01-08 ENCOUNTER — Encounter: Payer: Self-pay | Admitting: Internal Medicine

## 2022-01-08 DIAGNOSIS — N390 Urinary tract infection, site not specified: Secondary | ICD-10-CM | POA: Insufficient documentation

## 2022-01-08 NOTE — Progress Notes (Signed)
Subjective:    Patient ID: Valerie Mcclure, female    DOB: 1940/12/31, 81 y.o.   MRN: [id number]     HPI Valerie Mcclure is here for follow up from the hospital.  She was admitted 6/14-6/17 for syncope and collapse.  Since discharge from the first hospitalization she was frequently dizzy when ambulating.  On presentation to the emergency room blood work showed elevated creatinine of 2.1 and leukocytosis.  She was started on IV fluids.  Kidney function improved to close to baseline.  She was not orthostatic.  She denied dizziness/lightheadedness on ambulation.    Syncope-similar symptoms prior to this hospitalization and was confirmed to be orthostatic.  When she went home after the first hospitalization she was still dizzy with ambulation.  On multiple medications that would contribute torsemide-metoprolol-lisinopril. Seem to be related to dehydration, orthostasis Extensive work-up did not reveal any other cause including brain MRI, carotid Doppler, echo Initially given IV fluids, which were then held after developing bilateral leg edema Has home health  AKI on CKD, 3-day-was dehydrated on admission Creatinine improved with IVF On discharge creatinine was at baseline  Hypokalemia-supplemented  Suspected UTI, leukocytosis--UA suggestive of UTI.  Started on ceftriaxone and completed 3-day course Leukocytosis resolved  Paroxysmal A-fib currently in normal sinus rhythm On metoprolol, Eliquis Dose of metoprolol was reduced  Diastolic congestive heart failure-last echo showed EF of 65-70%, indeterminate diastolic dysfunction Torsemide dose decreased from 20 mg daily to 10 mg  Depression-fluoxetine continued  Hypothyroidism On Synthroid-TSH mildly elevated at 5.1 Outpatient follow-up  Normocytic anemia- Hemoglobin dropped to 7.9 from 9-most likely related to hemodilution from IV fluids Monitor  History of CVA- History of ischemic/hemorrhagic stroke On Lipitor,  Eliquis  Hyperlipidemia- On Lipitor  Hypertension- On metoprolol, torsemide and her doses have been decreased, but no longer on lisinopril     Medications and allergies reviewed with patient and updated if appropriate.  Current Outpatient Medications on File Prior to Visit  Medication Sig Dispense Refill   acetaminophen (TYLENOL) 500 MG tablet Take 1,000 mg by mouth every 6 (six) hours as needed for moderate pain.     atorvastatin (LIPITOR) 20 MG tablet TAKE 1 TABLET(20 MG) BY MOUTH DAILY (Patient taking differently: Take 20 mg by mouth daily.) 90 tablet 3   CALCIUM-VITAMIN D PO Take 1 tablet by mouth daily.     clonazePAM (KLONOPIN) 0.5 MG tablet Take 0.5 tablets (0.25 mg total) by mouth 2 (two) times daily as needed for anxiety. (Patient not taking: Reported on 01/05/2022) 30 tablet 0   ELIQUIS 2.5 MG TABS tablet TAKE 1 TABLET(2.5 MG) BY MOUTH TWICE DAILY (Patient taking differently: Take 2.5 mg by mouth 2 (two) times daily.) 60 tablet 1   FLUoxetine (PROZAC) 40 MG capsule TAKE 1 CAPSULE(40 MG) BY MOUTH DAILY (Patient taking differently: Take 40 mg by mouth daily.) 90 capsule 0   levothyroxine (SYNTHROID) 75 MCG tablet TAKE 1 TABLET(75 MCG) BY MOUTH DAILY BEFORE BREAKFAST. FOLLOW-UP (Patient taking differently: Take 75 mcg by mouth daily before breakfast.) 30 tablet 2   metoprolol tartrate (LOPRESSOR) 25 MG tablet Take 0.5 tablets (12.5 mg total) by mouth 2 (two) times daily. 30 tablet 0   nortriptyline (PAMELOR) 10 MG capsule TAKE 4 CAPSULES(40 MG) BY MOUTH AT BEDTIME (Patient taking differently: Take 40 mg by mouth at bedtime.) 360 capsule 0   senna-docusate (SENOKOT-S) 8.6-50 MG tablet Take 2 tablets by mouth 2 (two) times daily.     tamsulosin (FLOMAX) 0.4 MG  CAPS capsule Take 1 capsule (0.4 mg total) by mouth daily. 30 capsule 0   torsemide (DEMADEX) 10 MG tablet Take 1 tablet (10 mg total) by mouth daily. 30 tablet 0   No current facility-administered medications on file prior to  visit.     Review of Systems     Objective:  There were no vitals filed for this visit. BP Readings from Last 3 Encounters:  01/03/22 (!) 137/54  12/17/21 (!) 115/54  11/12/21 140/68   Wt Readings from Last 3 Encounters:  12/17/21 167 lb 15.9 oz (76.2 kg)  10/13/21 171 lb (77.6 kg)  06/18/21 163 lb (73.9 kg)   There is no height or weight on file to calculate BMI.    Physical Exam     Lab Results  Component Value Date   WBC 8.0 01/03/2022   HGB 8.9 (L) 01/03/2022   HCT 27.5 (L) 01/03/2022   PLT 251 01/03/2022   GLUCOSE 121 (H) 01/03/2022   CHOL 107 12/09/2021   TRIG 135 12/09/2021   HDL 40 (L) 12/09/2021   LDLDIRECT 57.0 10/13/2021   LDLCALC 40 12/09/2021   ALT 10 01/01/2022   AST 12 (L) 01/01/2022   NA 135 01/03/2022   K 4.2 01/03/2022   CL 106 01/03/2022   CREATININE 1.54 (H) 01/03/2022   BUN 16 01/03/2022   CO2 19 (L) 01/03/2022   TSH 5.183 (H) 12/31/2021   INR 1.4 (H) 12/08/2021   HGBA1C 6.6 (H) 10/13/2021     Assessment & Plan:    See Problem List for Assessment and Plan of chronic medical problems.   This encounter was created in error - please disregard.

## 2022-01-08 NOTE — Telephone Encounter (Signed)
Left a detailed message with Belenda Cruise at [facility] giving her your ok for verbal orders.

## 2022-01-08 NOTE — Patient Instructions (Signed)
     Blood work was ordered.     Medications changes include :   none   Your prescription(s) have been sent to your pharmacy.    A referral was ordered for [facility] for a colonoscopy.     Someone from that office will call you to schedule an appointment.    Return in about 6 months (around 08/20/2022) for Physical Exam.   [facility] Phone: [phone]  

## 2022-01-08 NOTE — Telephone Encounter (Signed)
Earl Lagos to take pt to ER per Harland Dingwall NP

## 2022-01-08 NOTE — Telephone Encounter (Signed)
Don Johnston/[place] states pt  BP low 92/50 dizzy

## 2022-01-09 ENCOUNTER — Encounter: Payer: HMO | Admitting: Internal Medicine

## 2022-01-09 DIAGNOSIS — N3000 Acute cystitis without hematuria: Secondary | ICD-10-CM

## 2022-01-09 DIAGNOSIS — R55 Syncope and collapse: Secondary | ICD-10-CM

## 2022-01-09 DIAGNOSIS — I10 Essential (primary) hypertension: Secondary | ICD-10-CM

## 2022-01-15 ENCOUNTER — Other Ambulatory Visit: Payer: Self-pay | Admitting: Internal Medicine

## 2022-01-15 ENCOUNTER — Telehealth: Payer: Self-pay | Admitting: Internal Medicine

## 2022-01-15 NOTE — Telephone Encounter (Signed)
Caprice Red an occupational therapist at [facility] is requesting Verbal Orders for a nurse for [place]. He stated she has a level two pressure ulcer that is slightly bleeding.  Please advise  [phone]

## 2022-01-15 NOTE — Telephone Encounter (Signed)
Pt had a fall/No injuries/Called 911 to help her up

## 2022-01-15 NOTE — Telephone Encounter (Signed)
Home health nurse said that Valerie Mcclure has a pressure sore on her buttocks.  They would like for a barrier cream to be called in or for an over the counter one to be recommended.  Please advise.

## 2022-01-16 ENCOUNTER — Telehealth: Payer: Self-pay | Admitting: Internal Medicine

## 2022-01-16 NOTE — Telephone Encounter (Signed)
See below

## 2022-01-16 NOTE — Telephone Encounter (Signed)
[place] for verbal for nurse wound care if that is ok

## 2022-01-16 NOTE — Telephone Encounter (Signed)
noted 

## 2022-01-16 NOTE — Telephone Encounter (Signed)
Pt's daughter called and asked for pt's levothyroxine (SYNTHROID) 75 MCG tablet to be refilled at the [id number] on [address]  Phone:[phone] Fax: [phone]

## 2022-01-16 NOTE — Telephone Encounter (Signed)
Caprice Red an occupational therapist at [facility] called in today to follow up on the verbal orders for a nurse for [place]. He stated she has a pressure ulcer and she desperately needs a nurse.    Please advise   CB: [phone]

## 2022-01-19 MED ORDER — LEVOTHYROXINE SODIUM 75 MCG PO TABS: ORAL_TABLET | ORAL | 2 refills | Status: DC

## 2022-01-19 MED ORDER — METOPROLOL TARTRATE 25 MG PO TABS: 12.5000 mg | ORAL_TABLET | Freq: Two times a day (BID) | ORAL | 0 refills | Status: DC

## 2022-01-19 NOTE — Telephone Encounter (Signed)
Valerie Mcclure calls back today in regards to these orders. I had let him know that Dr.John had given the OK but he is requiring that a CMA calls him to give him this order verbally.  CB: [phone]

## 2022-01-19 NOTE — Telephone Encounter (Signed)
Sent in both refills

## 2022-01-19 NOTE — Addendum Note (Signed)
Addended by: Pricilla Holm A on: 01/19/2022 12:30 PM   Modules accepted: Orders

## 2022-01-19 NOTE — Telephone Encounter (Signed)
Pt has now called again to get an update on her levothyroxine (SYNTHROID) 75 MCG tablet refill.  Pt is also requesting a refill of her metoprolol tartrate (LOPRESSOR) 25 MG tablet.  Please send RX's to:  [id number] on [address]   [phone] Fax: [phone]

## 2022-01-19 NOTE — Telephone Encounter (Signed)
LDVM for Timmothy Sours of Verbal okay for nurse.

## 2022-01-19 NOTE — Telephone Encounter (Signed)
Message sent in provider absence. Patient is requesting a refill of her Metoprolol. Original rx was prescribed by another provider. Please advise

## 2022-01-20 ENCOUNTER — Other Ambulatory Visit: Payer: Self-pay | Admitting: Internal Medicine

## 2022-01-20 DIAGNOSIS — E1122 Type 2 diabetes mellitus with diabetic chronic kidney disease: Secondary | ICD-10-CM | POA: Diagnosis not present

## 2022-01-20 DIAGNOSIS — R1312 Dysphagia, oropharyngeal phase: Secondary | ICD-10-CM | POA: Diagnosis not present

## 2022-01-20 DIAGNOSIS — I4892 Unspecified atrial flutter: Secondary | ICD-10-CM | POA: Diagnosis not present

## 2022-01-20 DIAGNOSIS — Z9181 History of falling: Secondary | ICD-10-CM | POA: Diagnosis not present

## 2022-01-20 DIAGNOSIS — D631 Anemia in chronic kidney disease: Secondary | ICD-10-CM | POA: Diagnosis not present

## 2022-01-20 DIAGNOSIS — D696 Thrombocytopenia, unspecified: Secondary | ICD-10-CM | POA: Diagnosis not present

## 2022-01-20 DIAGNOSIS — E039 Hypothyroidism, unspecified: Secondary | ICD-10-CM | POA: Diagnosis not present

## 2022-01-20 DIAGNOSIS — Z7901 Long term (current) use of anticoagulants: Secondary | ICD-10-CM | POA: Diagnosis not present

## 2022-01-20 DIAGNOSIS — M545 Low back pain, unspecified: Secondary | ICD-10-CM | POA: Diagnosis not present

## 2022-01-20 DIAGNOSIS — F32A Depression, unspecified: Secondary | ICD-10-CM | POA: Diagnosis not present

## 2022-01-20 DIAGNOSIS — E785 Hyperlipidemia, unspecified: Secondary | ICD-10-CM | POA: Diagnosis not present

## 2022-01-20 DIAGNOSIS — N184 Chronic kidney disease, stage 4 (severe): Secondary | ICD-10-CM | POA: Diagnosis not present

## 2022-01-20 DIAGNOSIS — I69351 Hemiplegia and hemiparesis following cerebral infarction affecting right dominant side: Secondary | ICD-10-CM | POA: Diagnosis not present

## 2022-01-20 DIAGNOSIS — G8929 Other chronic pain: Secondary | ICD-10-CM | POA: Diagnosis not present

## 2022-01-20 DIAGNOSIS — I5032 Chronic diastolic (congestive) heart failure: Secondary | ICD-10-CM | POA: Diagnosis not present

## 2022-01-20 DIAGNOSIS — I951 Orthostatic hypotension: Secondary | ICD-10-CM | POA: Diagnosis not present

## 2022-01-20 DIAGNOSIS — I4819 Other persistent atrial fibrillation: Secondary | ICD-10-CM | POA: Diagnosis not present

## 2022-01-20 DIAGNOSIS — I130 Hypertensive heart and chronic kidney disease with heart failure and stage 1 through stage 4 chronic kidney disease, or unspecified chronic kidney disease: Secondary | ICD-10-CM | POA: Diagnosis not present

## 2022-01-21 ENCOUNTER — Inpatient Hospital Stay (HOSPITAL_COMMUNITY)
Admission: EM | Admit: 2022-01-21 | Discharge: 2022-01-29 | DRG: 871 | Disposition: A | Discharge status: Home Health | Payer: HMO | Attending: Family Medicine | Admitting: Family Medicine

## 2022-01-21 ENCOUNTER — Other Ambulatory Visit: Payer: Self-pay

## 2022-01-21 ENCOUNTER — Observation Stay (HOSPITAL_COMMUNITY): Payer: HMO

## 2022-01-21 ENCOUNTER — Encounter (HOSPITAL_COMMUNITY): Payer: Self-pay | Admitting: Radiology

## 2022-01-21 ENCOUNTER — Telehealth: Payer: Self-pay | Admitting: Internal Medicine

## 2022-01-21 ENCOUNTER — Emergency Department (HOSPITAL_COMMUNITY): Payer: HMO

## 2022-01-21 DIAGNOSIS — I5032 Chronic diastolic (congestive) heart failure: Secondary | ICD-10-CM | POA: Diagnosis present

## 2022-01-21 DIAGNOSIS — N390 Urinary tract infection, site not specified: Secondary | ICD-10-CM | POA: Diagnosis present

## 2022-01-21 DIAGNOSIS — I351 Nonrheumatic aortic (valve) insufficiency: Secondary | ICD-10-CM | POA: Diagnosis present

## 2022-01-21 DIAGNOSIS — K118 Other diseases of salivary glands: Secondary | ICD-10-CM | POA: Diagnosis present

## 2022-01-21 DIAGNOSIS — T6701XA Heatstroke and sunstroke, initial encounter: Principal | ICD-10-CM

## 2022-01-21 DIAGNOSIS — R0989 Other specified symptoms and signs involving the circulatory and respiratory systems: Secondary | ICD-10-CM | POA: Diagnosis not present

## 2022-01-21 DIAGNOSIS — A4159 Other Gram-negative sepsis: Principal | ICD-10-CM | POA: Diagnosis present

## 2022-01-21 DIAGNOSIS — I10 Essential (primary) hypertension: Secondary | ICD-10-CM | POA: Diagnosis present

## 2022-01-21 DIAGNOSIS — I517 Cardiomegaly: Secondary | ICD-10-CM | POA: Diagnosis not present

## 2022-01-21 DIAGNOSIS — Z880 Allergy status to penicillin: Secondary | ICD-10-CM

## 2022-01-21 DIAGNOSIS — I69354 Hemiplegia and hemiparesis following cerebral infarction affecting left non-dominant side: Secondary | ICD-10-CM

## 2022-01-21 DIAGNOSIS — R680 Hypothermia, not associated with low environmental temperature: Secondary | ICD-10-CM | POA: Diagnosis not present

## 2022-01-21 DIAGNOSIS — I69322 Dysarthria following cerebral infarction: Secondary | ICD-10-CM

## 2022-01-21 DIAGNOSIS — R4182 Altered mental status, unspecified: Secondary | ICD-10-CM | POA: Diagnosis not present

## 2022-01-21 DIAGNOSIS — Z79899 Other long term (current) drug therapy: Secondary | ICD-10-CM

## 2022-01-21 DIAGNOSIS — I69320 Aphasia following cerebral infarction: Secondary | ICD-10-CM | POA: Diagnosis not present

## 2022-01-21 DIAGNOSIS — R531 Weakness: Secondary | ICD-10-CM

## 2022-01-21 DIAGNOSIS — R9389 Abnormal findings on diagnostic imaging of other specified body structures: Secondary | ICD-10-CM

## 2022-01-21 DIAGNOSIS — T68XXXA Hypothermia, initial encounter: Secondary | ICD-10-CM | POA: Diagnosis not present

## 2022-01-21 DIAGNOSIS — N1832 Chronic kidney disease, stage 3b: Secondary | ICD-10-CM | POA: Diagnosis present

## 2022-01-21 DIAGNOSIS — E039 Hypothyroidism, unspecified: Secondary | ICD-10-CM | POA: Diagnosis present

## 2022-01-21 DIAGNOSIS — D638 Anemia in other chronic diseases classified elsewhere: Secondary | ICD-10-CM | POA: Diagnosis not present

## 2022-01-21 DIAGNOSIS — R2990 Unspecified symptoms and signs involving the nervous system: Secondary | ICD-10-CM | POA: Diagnosis not present

## 2022-01-21 DIAGNOSIS — F329 Major depressive disorder, single episode, unspecified: Secondary | ICD-10-CM | POA: Diagnosis present

## 2022-01-21 DIAGNOSIS — L98429 Non-pressure chronic ulcer of back with unspecified severity: Secondary | ICD-10-CM | POA: Diagnosis present

## 2022-01-21 DIAGNOSIS — I130 Hypertensive heart and chronic kidney disease with heart failure and stage 1 through stage 4 chronic kidney disease, or unspecified chronic kidney disease: Secondary | ICD-10-CM | POA: Diagnosis present

## 2022-01-21 DIAGNOSIS — R338 Other retention of urine: Secondary | ICD-10-CM

## 2022-01-21 DIAGNOSIS — D631 Anemia in chronic kidney disease: Secondary | ICD-10-CM | POA: Diagnosis present

## 2022-01-21 DIAGNOSIS — G9341 Metabolic encephalopathy: Secondary | ICD-10-CM | POA: Diagnosis present

## 2022-01-21 DIAGNOSIS — I700 Atherosclerosis of aorta: Secondary | ICD-10-CM | POA: Diagnosis present

## 2022-01-21 DIAGNOSIS — R55 Syncope and collapse: Secondary | ICD-10-CM

## 2022-01-21 DIAGNOSIS — Z20822 Contact with and (suspected) exposure to covid-19: Secondary | ICD-10-CM | POA: Diagnosis present

## 2022-01-21 DIAGNOSIS — I69351 Hemiplegia and hemiparesis following cerebral infarction affecting right dominant side: Secondary | ICD-10-CM | POA: Diagnosis not present

## 2022-01-21 DIAGNOSIS — A498 Other bacterial infections of unspecified site: Secondary | ICD-10-CM

## 2022-01-21 DIAGNOSIS — Z8249 Family history of ischemic heart disease and other diseases of the circulatory system: Secondary | ICD-10-CM

## 2022-01-21 DIAGNOSIS — G9389 Other specified disorders of brain: Secondary | ICD-10-CM | POA: Diagnosis not present

## 2022-01-21 DIAGNOSIS — R471 Dysarthria and anarthria: Secondary | ICD-10-CM | POA: Diagnosis present

## 2022-01-21 DIAGNOSIS — E1122 Type 2 diabetes mellitus with diabetic chronic kidney disease: Secondary | ICD-10-CM | POA: Diagnosis present

## 2022-01-21 DIAGNOSIS — R6510 Systemic inflammatory response syndrome (SIRS) of non-infectious origin without acute organ dysfunction: Secondary | ICD-10-CM | POA: Diagnosis present

## 2022-01-21 DIAGNOSIS — F339 Major depressive disorder, recurrent, unspecified: Secondary | ICD-10-CM | POA: Diagnosis not present

## 2022-01-21 DIAGNOSIS — E7849 Other hyperlipidemia: Secondary | ICD-10-CM | POA: Diagnosis not present

## 2022-01-21 DIAGNOSIS — Z66 Do not resuscitate: Secondary | ICD-10-CM | POA: Diagnosis present

## 2022-01-21 DIAGNOSIS — R29898 Other symptoms and signs involving the musculoskeletal system: Secondary | ICD-10-CM | POA: Diagnosis not present

## 2022-01-21 DIAGNOSIS — I672 Cerebral atherosclerosis: Secondary | ICD-10-CM | POA: Diagnosis not present

## 2022-01-21 DIAGNOSIS — I4819 Other persistent atrial fibrillation: Secondary | ICD-10-CM | POA: Diagnosis present

## 2022-01-21 DIAGNOSIS — A419 Sepsis, unspecified organism: Secondary | ICD-10-CM

## 2022-01-21 DIAGNOSIS — L98421 Non-pressure chronic ulcer of back limited to breakdown of skin: Secondary | ICD-10-CM

## 2022-01-21 DIAGNOSIS — Z7901 Long term (current) use of anticoagulants: Secondary | ICD-10-CM | POA: Diagnosis not present

## 2022-01-21 DIAGNOSIS — I639 Cerebral infarction, unspecified: Secondary | ICD-10-CM | POA: Diagnosis not present

## 2022-01-21 DIAGNOSIS — Z818 Family history of other mental and behavioral disorders: Secondary | ICD-10-CM

## 2022-01-21 DIAGNOSIS — E785 Hyperlipidemia, unspecified: Secondary | ICD-10-CM | POA: Diagnosis present

## 2022-01-21 DIAGNOSIS — R0902 Hypoxemia: Secondary | ICD-10-CM | POA: Diagnosis not present

## 2022-01-21 DIAGNOSIS — Z91013 Allergy to seafood: Secondary | ICD-10-CM

## 2022-01-21 DIAGNOSIS — I6522 Occlusion and stenosis of left carotid artery: Secondary | ICD-10-CM | POA: Diagnosis present

## 2022-01-21 DIAGNOSIS — Z1629 Resistance to other single specified antibiotic: Secondary | ICD-10-CM | POA: Diagnosis present

## 2022-01-21 DIAGNOSIS — Z87828 Personal history of other (healed) physical injury and trauma: Secondary | ICD-10-CM | POA: Diagnosis not present

## 2022-01-21 DIAGNOSIS — R410 Disorientation, unspecified: Secondary | ICD-10-CM

## 2022-01-21 DIAGNOSIS — B9689 Other specified bacterial agents as the cause of diseases classified elsewhere: Secondary | ICD-10-CM | POA: Diagnosis present

## 2022-01-21 DIAGNOSIS — E119 Type 2 diabetes mellitus without complications: Secondary | ICD-10-CM | POA: Diagnosis not present

## 2022-01-21 DIAGNOSIS — R29818 Other symptoms and signs involving the nervous system: Secondary | ICD-10-CM | POA: Diagnosis not present

## 2022-01-21 DIAGNOSIS — F3289 Other specified depressive episodes: Secondary | ICD-10-CM | POA: Diagnosis not present

## 2022-01-21 DIAGNOSIS — Z888 Allergy status to other drugs, medicaments and biological substances status: Secondary | ICD-10-CM

## 2022-01-21 DIAGNOSIS — I959 Hypotension, unspecified: Secondary | ICD-10-CM | POA: Diagnosis not present

## 2022-01-21 DIAGNOSIS — L89152 Pressure ulcer of sacral region, stage 2: Secondary | ICD-10-CM | POA: Diagnosis present

## 2022-01-21 DIAGNOSIS — F32A Depression, unspecified: Secondary | ICD-10-CM | POA: Diagnosis present

## 2022-01-21 DIAGNOSIS — M25562 Pain in left knee: Secondary | ICD-10-CM

## 2022-01-21 DIAGNOSIS — Z79890 Hormone replacement therapy (postmenopausal): Secondary | ICD-10-CM

## 2022-01-21 DIAGNOSIS — R4781 Slurred speech: Secondary | ICD-10-CM | POA: Diagnosis not present

## 2022-01-21 LAB — RAPID URINE DRUG SCREEN, HOSP PERFORMED
Amphetamines: NOT DETECTED
Barbiturates: NOT DETECTED
Benzodiazepines: NOT DETECTED
Cocaine: NOT DETECTED
Opiates: NOT DETECTED
Tetrahydrocannabinol: NOT DETECTED

## 2022-01-21 LAB — COMPREHENSIVE METABOLIC PANEL
ALT: 14 U/L (ref 0–44)
AST: 21 U/L (ref 15–41)
Albumin: 2.9 g/dL — ABNORMAL LOW (ref 3.5–5.0)
Alkaline Phosphatase: 71 U/L (ref 38–126)
Anion gap: 12 (ref 5–15)
BUN: 19 mg/dL (ref 8–23)
CO2: 24 mmol/L (ref 22–32)
Calcium: 9.3 mg/dL (ref 8.9–10.3)
Chloride: 102 mmol/L (ref 98–111)
Creatinine, Ser: 1.48 mg/dL — ABNORMAL HIGH (ref 0.44–1.00)
GFR, Estimated: 35 mL/min — ABNORMAL LOW (ref >60)
Glucose, Bld: 118 mg/dL — ABNORMAL HIGH (ref 70–99)
Potassium: 3.6 mmol/L (ref 3.5–5.1)
Sodium: 138 mmol/L (ref 135–145)
Total Bilirubin: 0.5 mg/dL (ref 0.3–1.2)
Total Protein: 5.8 g/dL — ABNORMAL LOW (ref 6.5–8.1)

## 2022-01-21 LAB — CBC
HCT: 28.6 % — ABNORMAL LOW (ref 36.0–46.0)
Hemoglobin: 9.2 g/dL — ABNORMAL LOW (ref 12.0–15.0)
MCH: 30.6 pg (ref 26.0–34.0)
MCHC: 32.2 g/dL (ref 30.0–36.0)
MCV: 95 fL (ref 80.0–100.0)
Platelets: 218 10*3/uL (ref 150–400)
RBC: 3.01 MIL/uL — ABNORMAL LOW (ref 3.87–5.11)
RDW: 15.5 % (ref 11.5–15.5)
WBC: 5.2 10*3/uL (ref 4.0–10.5)
nRBC: 0 % (ref 0.0–0.2)

## 2022-01-21 LAB — LACTIC ACID, PLASMA
Lactic Acid, Venous: 0.8 mmol/L (ref 0.5–1.9)
Lactic Acid, Venous: 0.9 mmol/L (ref 0.5–1.9)

## 2022-01-21 LAB — CBG MONITORING, ED
Glucose-Capillary: 125 mg/dL — ABNORMAL HIGH (ref 70–99)
Glucose-Capillary: 147 mg/dL — ABNORMAL HIGH (ref 70–99)

## 2022-01-21 LAB — I-STAT CHEM 8, ED
BUN: 19 mg/dL (ref 8–23)
Calcium, Ion: 1.15 mmol/L (ref 1.15–1.40)
Chloride: 101 mmol/L (ref 98–111)
Creatinine, Ser: 1.5 mg/dL — ABNORMAL HIGH (ref 0.44–1.00)
Glucose, Bld: 117 mg/dL — ABNORMAL HIGH (ref 70–99)
HCT: 28 % — ABNORMAL LOW (ref 36.0–46.0)
Hemoglobin: 9.5 g/dL — ABNORMAL LOW (ref 12.0–15.0)
Potassium: 3.5 mmol/L (ref 3.5–5.1)
Sodium: 135 mmol/L (ref 135–145)
TCO2: 22 mmol/L (ref 22–32)

## 2022-01-21 LAB — TSH: TSH: 5.974 u[IU]/mL — ABNORMAL HIGH (ref 0.350–4.500)

## 2022-01-21 LAB — URINALYSIS, ROUTINE W REFLEX MICROSCOPIC
Bilirubin Urine: NEGATIVE
Glucose, UA: NEGATIVE mg/dL
Ketones, ur: NEGATIVE mg/dL
Nitrite: POSITIVE — AB
Protein, ur: NEGATIVE mg/dL
Specific Gravity, Urine: 1.013 (ref 1.005–1.030)
WBC, UA: >50 WBC/hpf — ABNORMAL HIGH (ref 0–5)
pH: 5 (ref 5.0–8.0)

## 2022-01-21 LAB — DIFFERENTIAL
Abs Immature Granulocytes: 0.02 10*3/uL (ref 0.00–0.07)
Basophils Absolute: 0 10*3/uL (ref 0.0–0.1)
Basophils Relative: 1 %
Eosinophils Absolute: 0.1 10*3/uL (ref 0.0–0.5)
Eosinophils Relative: 3 %
Immature Granulocytes: 0 %
Lymphocytes Relative: 24 %
Lymphs Abs: 1.3 10*3/uL (ref 0.7–4.0)
Monocytes Absolute: 0.6 10*3/uL (ref 0.1–1.0)
Monocytes Relative: 11 %
Neutro Abs: 3.2 10*3/uL (ref 1.7–7.7)
Neutrophils Relative %: 61 %

## 2022-01-21 LAB — PROTIME-INR
INR: 1.3 — ABNORMAL HIGH (ref 0.8–1.2)
Prothrombin Time: 16.1 seconds — ABNORMAL HIGH (ref 11.4–15.2)

## 2022-01-21 LAB — RESP PANEL BY RT-PCR (FLU A&B, COVID) ARPGX2
Influenza A by PCR: NEGATIVE
Influenza B by PCR: NEGATIVE
SARS Coronavirus 2 by RT PCR: NEGATIVE

## 2022-01-21 LAB — APTT: aPTT: 38 seconds — ABNORMAL HIGH (ref 24–36)

## 2022-01-21 LAB — ETHANOL: Alcohol, Ethyl (B): <10 mg/dL (ref <10)

## 2022-01-21 MED ORDER — SENNOSIDES-DOCUSATE SODIUM 8.6-50 MG PO TABS: 2.0000 | ORAL_TABLET | Freq: Two times a day (BID) | ORAL | Status: DC | PRN

## 2022-01-21 MED ORDER — ATORVASTATIN CALCIUM 10 MG PO TABS
20.0000 mg | ORAL_TABLET | Freq: Every day | ORAL | Status: DC
  Administered 2022-01-22 – 2022-01-29 (×8): 20 mg via ORAL
  Filled 2022-01-21 (×8): qty 2

## 2022-01-21 MED ORDER — APIXABAN 2.5 MG PO TABS
2.5000 mg | ORAL_TABLET | Freq: Two times a day (BID) | ORAL | Status: DC
  Administered 2022-01-21 – 2022-01-26 (×10): 2.5 mg via ORAL
  Filled 2022-01-21 (×11): qty 1

## 2022-01-21 MED ORDER — NORTRIPTYLINE HCL 10 MG PO CAPS
40.0000 mg | ORAL_CAPSULE | Freq: Every day | ORAL | Status: DC
  Administered 2022-01-21 – 2022-01-28 (×7): 40 mg via ORAL
  Filled 2022-01-21 (×10): qty 4

## 2022-01-21 MED ORDER — TAMSULOSIN HCL 0.4 MG PO CAPS
0.4000 mg | ORAL_CAPSULE | Freq: Every day | ORAL | Status: DC
  Administered 2022-01-22 – 2022-01-29 (×8): 0.4 mg via ORAL
  Filled 2022-01-21 (×8): qty 1

## 2022-01-21 MED ORDER — ACETAMINOPHEN 160 MG/5ML PO SOLN: 650.0000 mg | ORAL | Status: DC | PRN

## 2022-01-21 MED ORDER — SODIUM CHLORIDE 0.9 % IV BOLUS
1000.0000 mL | Freq: Once | INTRAVENOUS | Status: AC
  Administered 2022-01-21: 1000 mL via INTRAVENOUS

## 2022-01-21 MED ORDER — FLUOXETINE HCL 20 MG PO CAPS
40.0000 mg | ORAL_CAPSULE | Freq: Every morning | ORAL | Status: DC
  Administered 2022-01-22 – 2022-01-29 (×8): 40 mg via ORAL
  Filled 2022-01-21 (×9): qty 2

## 2022-01-21 MED ORDER — METHYLPREDNISOLONE SODIUM SUCC 125 MG IJ SOLR
INTRAMUSCULAR | Status: AC
  Filled 2022-01-21: qty 2

## 2022-01-21 MED ORDER — DIPHENHYDRAMINE HCL 25 MG PO CAPS
50.0000 mg | ORAL_CAPSULE | Freq: Once | ORAL | Status: AC
Start: 2022-01-21 — End: 2022-01-21

## 2022-01-21 MED ORDER — LEVOTHYROXINE SODIUM 75 MCG PO TABS
75.0000 ug | ORAL_TABLET | Freq: Every day | ORAL | Status: DC
  Administered 2022-01-22 – 2022-01-29 (×8): 75 ug via ORAL
  Filled 2022-01-21 (×9): qty 1

## 2022-01-21 MED ORDER — SENNOSIDES-DOCUSATE SODIUM 8.6-50 MG PO TABS: 1.0000 | ORAL_TABLET | Freq: Two times a day (BID) | ORAL | Status: DC | PRN

## 2022-01-21 MED ORDER — ACETAMINOPHEN 650 MG RE SUPP: 650.0000 mg | RECTAL | Status: DC | PRN

## 2022-01-21 MED ORDER — INSULIN ASPART 100 UNIT/ML IJ SOLN
0.0000 [IU] | Freq: Three times a day (TID) | INTRAMUSCULAR | Status: DC
  Administered 2022-01-21: 1 [IU] via SUBCUTANEOUS
  Administered 2022-01-22: 2 [IU] via SUBCUTANEOUS
  Administered 2022-01-27 – 2022-01-29 (×3): 1 [IU] via SUBCUTANEOUS

## 2022-01-21 MED ORDER — DIPHENHYDRAMINE HCL 50 MG/ML IJ SOLN
INTRAMUSCULAR | Status: AC
  Administered 2022-01-21: 50 mg via INTRAVENOUS
  Filled 2022-01-21: qty 1

## 2022-01-21 MED ORDER — SENNOSIDES-DOCUSATE SODIUM 8.6-50 MG PO TABS: 1.0000 | ORAL_TABLET | Freq: Every evening | ORAL | Status: DC | PRN

## 2022-01-21 MED ORDER — IOHEXOL 350 MG/ML SOLN
60.0000 mL | Freq: Once | INTRAVENOUS | Status: AC | PRN
  Administered 2022-01-21: 60 mL via INTRAVENOUS

## 2022-01-21 MED ORDER — STROKE: EARLY STAGES OF RECOVERY BOOK
Freq: Once | Status: AC
Start: 2022-01-22 — End: 2022-01-22
  Filled 2022-01-21: qty 1

## 2022-01-21 MED ORDER — DIPHENHYDRAMINE HCL 50 MG/ML IJ SOLN: 50.0000 mg | Freq: Once | INTRAMUSCULAR | Status: AC

## 2022-01-21 MED ORDER — METHYLPREDNISOLONE SODIUM SUCC 40 MG IJ SOLR
40.0000 mg | Freq: Once | INTRAMUSCULAR | Status: AC
  Administered 2022-01-21: 40 mg via INTRAVENOUS

## 2022-01-21 MED ORDER — ACETAMINOPHEN 325 MG PO TABS
650.0000 mg | ORAL_TABLET | ORAL | Status: DC | PRN
  Administered 2022-01-26: 650 mg via ORAL
  Filled 2022-01-21 (×2): qty 2

## 2022-01-21 MED ORDER — SODIUM CHLORIDE 0.9 % IV SOLN
1.0000 g | INTRAVENOUS | Status: DC
  Administered 2022-01-21 – 2022-01-22 (×2): 1 g via INTRAVENOUS
  Filled 2022-01-21 (×2): qty 10

## 2022-01-21 NOTE — ED Triage Notes (Signed)
Pt BIB [facility] from home as a code stroke. Per EMS, pt was having breakfast w the family, then all of the sudden pt started exhibiting L side deficits . LKN was 830am this morning. VSS.

## 2022-01-21 NOTE — Assessment & Plan Note (Addendum)
Metoprolol, torsemide

## 2022-01-21 NOTE — Assessment & Plan Note (Addendum)
    Pressure Injury 01/22/22 Sacrum Right Stage 2 -  Partial thickness loss of dermis presenting as Tarl Cephas shallow open injury with Calixto Pavel red, pink wound bed without slough. (Active)  01/22/22 0820  Location: Sacrum  Location Orientation: Right  Staging: Stage 2 -  Partial thickness loss of dermis presenting as Latrice Storlie shallow open injury with Amier Hoyt red, pink wound bed without slough.  Wound Description (Comments):   Present on Admission: Yes

## 2022-01-21 NOTE — Assessment & Plan Note (Addendum)
81 year old female presenting with acute onset of left sided weakness and dysarthric speech with risk factors for stroke including age, sex, previous CVA, HTN, HLD, family history  MRI without acute intracranial pathology, remote infarct with associated chronic blood products in L MCA distribution CTA without intracranial LVO, mild atherosclerotic disease in carotid bifurcation without flow limiting stenosis, 25% diameter stenosis proximal L ICA Echo was done recently (11/2021) and EF 65-70% (see report) Carotid US 11/2021 with 1-39%  Continue eliquis, statin.  Dr. Jarvis Newcomer discussed with Dr. Pearlean Brownie.  Overnight with vagal event and concern for L sided weakness -> head CT without acute abnormality.  Follow MRI brain -> without acute abnormality

## 2022-01-21 NOTE — Assessment & Plan Note (Addendum)
hgb stable Continue to monitor

## 2022-01-21 NOTE — Assessment & Plan Note (Addendum)
A1C of 5.8 SSI and accuchecks qac/hs

## 2022-01-21 NOTE — ED Notes (Signed)
Pt changed. fresh linen and gown applied.

## 2022-01-21 NOTE — H&P (Signed)
History and Physical    Patient: Valerie Mcclure DOB: 1941/01/13 DOA: 01/21/2022 DOS: the patient was seen and examined on 01/21/2022 PCP: [name], MD  Patient coming from: Home - lives with her daughter.  Her husband died 5 months ago. Uses walker.    Chief Complaint: left sided weakness and dysarthric speech   HPI: Valerie Mcclure is a 81 y.o. female with medical history significant of hx of CVA with right sided deficits, HTN, HLD, hypothyroidism, T2DM, depression, chronic diastolic CHF, ACD, CKD stage IV, persistent atrial fibrillation on eliquis who presented to ED with speech deficits and weakness new from baseline. She has residual right sided weakness and some baseline aphasia.  She states around 1030 this AM she started to have acute left sided weakness and trouble speaking. She was eating breakfast and then started to talk funny in a wispy voice and became lethargic. Her speech was slurred and hard to understand. She was dragging her foot per EMS. Daughter wasn't aware of any left sided weakness. She states she has right sided weakness from her past CVA. She is able to walk with a walker. She is unable to write, she is right handed. She lost her husband 5 months ago and has been really sad.   LKW; 0830 on 01/21/22   She does not smoke or drink alcohol.  Hx of a Cva in her brother.  She last took her eliquis this AM. She has not missed any doses.    Denies any fever/chills, vision changes/headaches, chest pain or palpitations, shortness of breath or cough, abdominal pain, N/V/D,leg swelling.   She does state it hurts to urinate that started last night. No suprapubic pain or CVA tenderness.    ER Course:  vitals: temp: 97.5>94.3, blood pressure: 137/54, HR: 76, RR: 17, oxygen: 100%RA Pertinent labs: hgb: 9.2, creatinine: 1.48 (1.4),  CT head: negative for acute finding  CTA head: negative LVO. Mild atherosclerotic disease in carotid bifuciation without flow  limiting stenosis. 25% diameter stenosis proximal left internal carotid artery. In ED: code stroke activated, neurology consulted. 1L IVF bolus given.    Review of Systems: As mentioned in the history of present illness. All other systems reviewed and are negative. Past Medical History:  Diagnosis Date   Blood transfusion without reported diagnosis    Chronic low back pain    History of echocardiogram    Echo 5/18: EF 55-60, Gr 2 DD, mild MR, mild LAE, PASP 47 // Echo 1/18:  EF 55, mild AI, MAC, mild MR, mild LAE, trivial pericardial effusion   History of ischemic left MCA stroke 11/2016   left MCA infarct status post mechanical thrombectomy complicated by large left basal ganglia hemorrhage with right shift.   HTN (hypertension)    Hyperlipidemia    Hypothyroidism    Osteopenia    Persistent atrial fibrillation ([name])    CHADS2-VASc=6 (female, age 12, HTN, CVA) // Apixaban // rate control strategy    Sacral wound    chronic sacral wound   Past Surgical History:  Procedure Laterality Date   ABDOMINAL HYSTERECTOMY  1970   CHOLECYSTECTOMY  07/2009   Dr. Rise Patience   COLONOSCOPY  2003   FLEXIBLE SIGMOIDOSCOPY  2010   HAND SURGERY     IR ANGIO INTRA EXTRACRAN SEL COM CAROTID INNOMINATE UNI L MOD SED  11/29/2016   IR ANGIO VERTEBRAL SEL SUBCLAVIAN INNOMINATE UNI R MOD SED  11/29/2016   IR PERCUTANEOUS ART THROMBECTOMY/INFUSION INTRACRANIAL INC DIAG ANGIO  11/29/2016   IR RADIOLOGIST EVAL & MGMT  03/24/2017   LUMBAR LAMINECTOMY  11/2008   Done by Dr. Patrice Paradise   RADIOLOGY WITH ANESTHESIA N/A 11/29/2016   Procedure: RADIOLOGY WITH ANESTHESIA;  Surgeon: Radiologist, Medication, MD;  Location: [facility];  Service: Radiology;  Laterality: N/A;   THYROIDECTOMY     Social History:  reports that she has never smoked. She has never used smokeless tobacco. She reports that she does not drink alcohol and does not use drugs.  Allergies  Allergen Reactions   Clonazepam Other (See Comments)    AFFECTED MOOD  NEGATIVELY- Do NOT give   Shrimp [Shellfish Allergy] Anaphylaxis, Swelling, Rash and Other (See Comments)    Break outs and swelling of the throat   Valsartan Other (See Comments)    Renal failure   Tandem Plus [Fefum-Fepo-Fa-B Cmp-C-Zn-Mn-Cu] Nausea And Vomiting   Ivp Dye [Iodinated Contrast Media] Itching and Rash   Penicillins Itching and Rash    Has patient had a PCN reaction causing immediate rash, facial/tongue/throat swelling, SOB or lightheadedness with hypotension: Yes Has patient had a PCN reaction causing severe rash involving mucus membranes or skin necrosis: No Has patient had a PCN reaction that required hospitalization: No Has patient had a PCN reaction occurring within the last 10 years: No If all of the above answers are "NO", then may proceed with Cephalosporin use.     Family History  Problem Relation Age of Onset   Heart disease Father 18       MI age 61s   Lung cancer Brother 95   Colon cancer Neg Hx    Esophageal cancer Neg Hx    Rectal cancer Neg Hx    Stomach cancer Neg Hx     Prior to Admission medications   Medication Sig Start Date End Date Taking? Authorizing Provider  acetaminophen (TYLENOL) 500 MG tablet Take 1,000 mg by mouth every 6 (six) hours as needed for moderate pain.    [provider]  atorvastatin (LIPITOR) 20 MG tablet TAKE 1 TABLET(20 MG) BY MOUTH DAILY Patient taking differently: Take 20 mg by mouth daily. 04/10/21   [name], MD  CALCIUM-VITAMIN D PO Take 1 tablet by mouth daily.    [provider]  clonazePAM (KLONOPIN) 0.5 MG tablet Take 0.5 tablets (0.25 mg total) by mouth 2 (two) times daily as needed for anxiety. Patient not taking: Reported on 01/05/2022 08/19/21   [name], MD  ELIQUIS 2.5 MG TABS tablet TAKE 1 TABLET(2.5 MG) BY MOUTH TWICE DAILY Patient taking differently: Take 2.5 mg by mouth 2 (two) times daily. 11/12/21   [name], MD  FLUoxetine (PROZAC) 40 MG capsule TAKE 1 CAPSULE(40 MG) BY  MOUTH DAILY 01/16/22   Hoyt Koch, MD  levothyroxine (SYNTHROID) 75 MCG tablet TAKE 1 TABLET(75 MCG) BY MOUTH DAILY BEFORE BREAKFAST. FOLLOW-UP Strength: 75 mcg 01/19/22   Hoyt Koch, MD  metoprolol tartrate (LOPRESSOR) 25 MG tablet Take 0.5 tablets (12.5 mg total) by mouth 2 (two) times daily. 01/19/22   Hoyt Koch, MD  nortriptyline (PAMELOR) 10 MG capsule TAKE 4 CAPSULES(40 MG) BY MOUTH AT BEDTIME Patient taking differently: Take 40 mg by mouth at bedtime. 10/09/21   Burns, Claudina Lick, MD  senna-docusate (SENOKOT-S) 8.6-50 MG tablet Take 2 tablets by mouth 2 (two) times daily. 12/17/21   Pokhrel, Corrie Mckusick, MD  tamsulosin (FLOMAX) 0.4 MG CAPS capsule Take 1 capsule (0.4 mg total) by mouth daily. 01/05/22   Burns,  Claudina Lick, MD  torsemide (DEMADEX) 10 MG tablet Take 1 tablet (10 mg total) by mouth daily. 01/03/22   Shelly Coss, MD    Physical Exam: Vitals:   01/21/22 1241 01/21/22 1245 01/21/22 1257 01/21/22 1330  BP:  (!) 154/112  95/65  Pulse:  69 60 60  Resp:  (!) _0 Temp: (!) 94.3 F (34.6 C)     TempSrc: Rectal     SpO2:  100% 100% 98%   General:  Appears calm and comfortable and is in NAD Eyes:  PERRL, EOMI, normal lids, iris ENT:  grossly normal hearing, lips & tongue, mmm; appropriate dentition Neck:  no LAD, masses or thyromegaly; no carotid bruits Cardiovascular:  RRR, no m/r/g. +LE edema. 2+ pitting edema with some erythema and bullous lesions. No drainage.  Respiratory:   crackles in LLL, otherwise normal exam.  Normal respiratory effort. Abdomen:  soft, NT, ND, NABS Back:   normal alignment, no CVAT Skin:  chronic sacral ulcer. Minimal breakdown.  Musculoskeletal:  strength: BUE: 5/5, right LE: 4/5, left LE: 3-4/5  good ROM, no bony abnormality Lower extremity:   Limited foot exam with no ulcerations.  2+ distal pulses. Psychiatric:  grossly normal mood and affect, speech dysarthric with some aphasia and mildly unintelligible, alert and  oriented to self, place and birthday. Does not know date.  Neurologic:  CN 2-12 grossly intact, moves all extremities in coordinated fashion, sensation intact. FTN with ataxic effort and touched her mouth. HTK could not do/follow directions. Strength intact BUE, strength 4/5 RLE, 3-4/5 LLE. DTR intact. Gait deferred.    Radiological Exams on Admission: Independently reviewed - see discussion in A/P where applicable  DG CHEST PORT 1 VIEW  Result Date: 01/21/2022 CLINICAL DATA:  81 year old female with a history of hypothermia EXAM: PORTABLE CHEST 1 VIEW COMPARISON:  12/31/2021 FINDINGS: Cardiomediastinal silhouette unchanged in size and contour. No evidence of central vascular congestion. No interlobular septal thickening. No pneumothorax or pleural effusion. Coarsened interstitial markings, with no confluent airspace disease. No acute displaced fracture. IMPRESSION: Negative for acute cardiopulmonary disease Electronically Signed   By: Corrie Mckusick D.O.   On: 01/21/2022 13:23   CT ANGIO HEAD NECK W WO CM (CODE STROKE)  Result Date: 01/21/2022 CLINICAL DATA:  Patient presented with left-sided weakness and then developed aphasia. History of left MCA stroke. EXAM: CT ANGIOGRAPHY HEAD AND NECK TECHNIQUE: Multidetector CT imaging of the head and neck was performed using the standard protocol during bolus administration of intravenous contrast. Multiplanar CT image reconstructions and MIPs were obtained to evaluate the vascular anatomy. Carotid stenosis measurements (when applicable) are obtained utilizing NASCET criteria, using the distal internal carotid diameter as the denominator. RADIATION DOSE REDUCTION: This exam was performed according to the departmental dose-optimization program which includes automated exposure control, adjustment of the mA and/or kV according to patient size and/or use of iterative reconstruction technique. CONTRAST:  13m OMNIPAQUE IOHEXOL 350 MG/ML SOLN COMPARISON:  CT head  01/21/2022 FINDINGS: CTA NECK FINDINGS Aortic arch: Mild atherosclerotic calcification aortic arch and proximal great vessels. Proximal great vessels widely patent Right carotid system: Mild atherosclerotic calcification right carotid bifurcation without stenosis. Left carotid system: Atherosclerotic calcification left carotid bifurcation and proximal left internal carotid artery. 25% diameter stenosis proximal left internal carotid artery due to calcific plaque. Vertebral arteries: Both vertebral arteries widely patent and normal Skeleton: Cervical spondylosis without acute skeletal abnormality. Other neck: 10 mm right thyroid nodule with peripheral calcification. No further imaging recommended. (Ref:  J Am Coll Radiol. 2015 Feb;12(2): 143-50). Upper chest: Lung apices clear bilaterally. Review of the MIP images confirms the above findings CTA HEAD FINDINGS Anterior circulation: Mild atherosclerotic calcification in the cavernous carotid bilaterally without stenosis. Anterior and middle cerebral arteries patent without large vessel occlusion. Mild stenosis inferior division left middle cerebral artery. Right MCA widely patent. Anterior cerebral arteries widely patent bilaterally. Posterior circulation: Both vertebral arteries patent to the basilar without stenosis. PICA patent bilaterally. Basilar widely patent. Superior cerebellar and posterior cerebral arteries patent bilaterally without stenosis. Fetal origin left posterior cerebral artery. Venous sinuses: Normal venous enhancement Anatomic variants: None Review of the MIP images confirms the above findings IMPRESSION: 1. Negative for intracranial large vessel occlusion 2. Mild atherosclerotic disease in the carotid bifurcation without flow limiting stenosis. 25% diameter stenosis proximal left internal carotid artery. 3. These results were called by telephone at the time of interpretation on 01/21/2022 at 11:53 am to provider [facility] , who verbally  acknowledged these results. Electronically Signed   By: Franchot Gallo M.D.   On: 01/21/2022 11:54   CT HEAD CODE STROKE WO CONTRAST  Result Date: 01/21/2022 CLINICAL DATA:  Code stroke. Acute neuro deficit. Left-sided weakness and dysarthria. EXAM: CT HEAD WITHOUT CONTRAST TECHNIQUE: Contiguous axial images were obtained from the base of the skull through the vertex without intravenous contrast. RADIATION DOSE REDUCTION: This exam was performed according to the departmental dose-optimization program which includes automated exposure control, adjustment of the mA and/or kV according to patient size and/or use of iterative reconstruction technique. COMPARISON:  CT head 12/13/2021 FINDINGS: Brain: Negative for acute infarct, hemorrhage, mass. Chronic left MCA infarct unchanged. No right-sided infarct identified. Vascular: Negative for hyperdense vessel Skull: Negative Sinuses/Orbits: Paranasal sinuses clear.  Negative orbit Other: None ASPECTS ([place] Stroke Program Early CT Score) - Ganglionic level infarction (caudate, lentiform nuclei, internal capsule, insula, M1-M3 cortex): 7 - Supraganglionic infarction (M4-M6 cortex): 3 Total score (0-10 with 10 being normal): 10 IMPRESSION: 1. Negative for acute abnormality. Chronic left MCA infarct unchanged 2. ASPECTS is 10 3. Code stroke imaging results were communicated on 01/21/2022 at 11:34 am to provider Lorrin Goodell via text page Electronically Signed   By: Franchot Gallo M.D.   On: 01/21/2022 11:34    EKG: Independently reviewed.  NSR with rate 66; nonspecific ST changes with no evidence of acute ischemia   Labs on Admission: I have personally reviewed the available labs and imaging studies at the time of the admission.  Pertinent labs:    hgb: 9.2,  creatinine: 1.48 (1.4),   Assessment and Plan: Principal Problem:   Stroke-like symptoms Active Problems:   Hypothermia   Spastic hemiplegia of right dominant side as late effect of cerebral infarction  ([name])   SIRS (systemic inflammatory response syndrome) ([name])   Persistent atrial fibrillation ([name])   Chronic diastolic heart failure ([name])   Diabetes mellitus without complication ([name])   Depression   Hypertension   Hyperlipidemia   Hypothyroidism   Stage 3b chronic kidney disease (CKD) ([name])   Anemia of chronic disease   Skin ulcer of sacrum ([name])    Assessment and Plan: * Stroke-like symptoms 81 year old female presenting with acute onset of left sided weakness and dysarthric speech with risk factors for stroke including age, sex, previous CVA, HTN, HLD, family history  -observation to telemetry for TIA/CVA work up  [facility] and CTA head/neck negative for large CVA  -Neurochecks per protocol -Neurology consulted -MRI brain without contrast ordered -echo in  11/2021 with no shunts  -LDL and A1C pending. On statin, continue for now  -Continue eliquis  -Permissive hypertension first 24 hours <220/110 -N.p.o. until bedside swallow screen -PT/ OT/ SLP consult   Hypothermia Questionable if false reading or if in stool, she denies being cold. SIRs criteria with temp +PCO2 <32 -CXR and UA pending -urine culture -check blood cultures/lactic acid  -no leukocytosis  -check TSH  -repeat and trend  -bear hugger if needed  -could be secondary to age, immobility and neuro issues.   SIRS (systemic inflammatory response syndrome) ([name]) SIRS criteria met with hypothermia and PCO2 Blood pressures have been soft, but MAP stable CXR with no acute findings UA pending and complains of dysuria. Culture pending. Will treat if UA suspicious Blood cx ordered Lactic acid pending Bear hugger if still hypothermic  Watch pressures closely   Spastic hemiplegia of right dominant side as late effect of cerebral infarction ([place]) Right side appears at baseline.  Continue eliquis PT/OT/ST   Persistent atrial fibrillation ([place]) In NSR, continue monitoring on telemetry  Continue eliquis. Hold  lopressor to allow for permissive HTN in setting of possible acute CVA   Chronic diastolic heart failure ([name]) Has chronic LE edema, but not overloaded on exam  Recent echo 5/23: showed EF 65-70% with indeterminate diastolic function and normal RV function. Moderate aortic regurgitation.  -strict I/O   Depression Stable, continue prozac and pamelor qhs   Diabetes mellitus without complication ([name]) J9E of 6.6 in 09/2021 Repeat pending  SSI and accuchecks qac/hs   Hypertension Allow for permissive HTN in setting of cva work up Blood pressures soft, holding home medication   Hyperlipidemia Continue lipitor, goal LDL <70 Lipid panel pending   Hypothyroidism TSH checked 6/14 and elevated at 5.1  Will repeat today in setting of hypothermia   Stage 3b chronic kidney disease (CKD) ([place]) At baseline of creatinine 1.4 Continue to monitor   Anemia of chronic disease hgb 9.2 stable Continue to monitor   Skin ulcer of sacrum ([place]) Chronic, no exposed skin Wound care consult      Advance Care Planning:   Code Status: DNR   Consults: neurology: Dr. Lorrin Goodell   DVT Prophylaxis: eliquis   Family Communication: updated her daughter by phone: [name]: [phone]  Severity of Illness: The appropriate patient status for this patient is OBSERVATION. Observation status is judged to be reasonable and necessary in order to provide the required intensity of service to ensure the patient's safety. The patient's presenting symptoms, physical exam findings, and initial radiographic and laboratory data in the context of their medical condition is felt to place them at decreased risk for further clinical deterioration. Furthermore, it is anticipated that the patient will be medically stable for discharge from the hospital within 2 midnights of admission.   Author: Orma Flaming, MD 01/21/2022 2:29 PM  For on call review [email or web address].

## 2022-01-21 NOTE — Assessment & Plan Note (Addendum)
Continue lipitor, goal LDL <70 LDL 35

## 2022-01-21 NOTE — Evaluation (Signed)
Physical Therapy Evaluation Patient Details Name: Valerie Mcclure MRN: [id number] DOB: [birth year] Today's Date: 01/21/2022  History of Present Illness  81 y/o female presents to [facility] hospital 01/21/2022 with speech deficits and weakness. PT with chronic R weakness and baseline aphasia from prior CVA. Brain MRI negative. PMH includes L MCA CVA, HTN, osteopenia, afib, sacral wound., memory deficits, anemia  Clinical Impression  Pt presents to PT with deficits in functional mobility, gait, balance, strength, power, endurance, cognition, communication. Pt may not be far from recent mobility baseline, as she is able to transfer and ambulate short distances with limited physical assistance from PT. Due to pt's expressive aphasia history will need to be confirmed with family. Pt will benefit from aggressive mobilization and acute PT services to aide in a reducing her falls risk and caregiver burden. PT recommends discharge home with HHPT and assistance from her daughter when medically ready.     Recommendations for follow up therapy are one component of a multi-disciplinary discharge planning process, led by the attending physician.  Recommendations may be updated based on patient status, additional functional criteria and insurance authorization.  Follow Up Recommendations Home health PT      Assistance Recommended at Discharge Frequent or constant Supervision/Assistance  Patient can return home with the following  A lot of help with walking and/or transfers;A lot of help with bathing/dressing/bathroom;Assistance with cooking/housework;Direct supervision/assist for medications management;Direct supervision/assist for financial management;Assist for transportation;Help with stairs or ramp for entrance    Equipment Recommendations BSC/3in1  Recommendations for Other Services       Functional Status Assessment Patient has had a recent decline in their functional status and demonstrates the ability to  make significant improvements in function in a reasonable and predictable amount of time.     Precautions / Restrictions Precautions Precautions: Fall Restrictions Weight Bearing Restrictions: No      Mobility  Bed Mobility Overal bed mobility: Needs Assistance Bed Mobility: Supine to Sit, Sit to Supine     Supine to sit: Min assist, HOB elevated Sit to supine: Max assist (assist due to high ER bed)        Transfers Overall transfer level: Needs assistance Equipment used: Rolling walker (2 wheels) Transfers: Sit to/from Stand Sit to Stand: Min assist, From elevated surface                Ambulation/Gait Ambulation/Gait assistance: Min assist Gait Distance (Feet): 15 Feet (multiple consecutive trials of 3'forward and backward at edge of bed) Assistive device: Rolling walker (2 wheels) Gait Pattern/deviations: Step-to pattern Gait velocity: reduced Gait velocity interpretation: <1.31 ft/sec, indicative of household ambulator   General Gait Details: pt with short step-to gait, leftward lean toward PT, difficulty placing hand on walker  Stairs            Wheelchair Mobility    Modified Rankin (Stroke Patients Only)       Balance Overall balance assessment: Needs assistance Sitting-balance support: Feet supported, Single extremity supported Sitting balance-Leahy Scale: Poor Sitting balance - Comments: posterior lean, min-modA Postural control: Posterior lean Standing balance support: Bilateral upper extremity supported, Reliant on assistive device for balance Standing balance-Leahy Scale: Poor                               Pertinent Vitals/Pain Pain Assessment Pain Assessment: No/denies pain    Home Living Family/patient expects to be discharged to:: Private residence Living Arrangements: Children Available Help at  Discharge: Family;Available 24 hours/day Type of Home: Mobile home Home Access: Ramped entrance (per pt report, 5 STE  prior to most recent admission)       Home Layout: One level Home Equipment: Conservation officer, nature (2 wheels);Wheelchair - manual;Cane - single point;BSC/3in1      Prior Function Prior Level of Function : Needs assist (pt is a poor historian due to aphasia, will need to clarify with family)             Mobility Comments: pt reports she is able to ambulate with use of walker and some assistance from daughter for short distances, but mainly utilizes wheelchair for household mobility ADLs Comments: pt reports bathing herself, requires assistance for lower body dressing, assist for IADLs     Hand Dominance   Dominant Hand: Right (affected by prior CVA)    Extremity/Trunk Assessment   Upper Extremity Assessment Upper Extremity Assessment: RUE deficits/detail;LUE deficits/detail RUE Deficits / Details: pt with shoulder flexion to ~110 degrees AROM, elbow, wrist, digit ROM WFL. Pt with tendency for RUE in decorticate positioning, grossly 4-/5 LUE Deficits / Details: grossly 4/5    Lower Extremity Assessment Lower Extremity Assessment: RLE deficits/detail;LLE deficits/detail RLE Deficits / Details: grossly 4-/5 LLE Deficits / Details: grossly 4/5    Cervical / Trunk Assessment Cervical / Trunk Assessment: Kyphotic  Communication   Communication: Expressive difficulties (at baseline, possibly exacerbated?)  Cognition Arousal/Alertness: Awake/alert Behavior During Therapy: WFL for tasks assessed/performed Overall Cognitive Status: History of cognitive impairments - at baseline Area of Impairment: Orientation, Attention, Memory, Following commands, Safety/judgement, Awareness, Problem solving                 Orientation Level: Disoriented to, Place, Time, Situation Current Attention Level: Sustained Memory: Decreased recall of precautions, Decreased short-term memory Following Commands: Follows one step commands consistently, Follows multi-step commands with increased  time Safety/Judgement: Decreased awareness of safety, Decreased awareness of deficits Awareness: Intellectual Problem Solving: Slow processing, Difficulty sequencing, Requires verbal cues          General Comments General comments (skin integrity, edema, etc.): VSS on RA    Exercises     Assessment/Plan    PT Assessment Patient needs continued PT services  PT Problem List Decreased strength;Decreased activity tolerance;Decreased balance;Decreased mobility;Decreased range of motion;Decreased cognition;Decreased knowledge of use of DME;Decreased safety awareness;Decreased knowledge of precautions;Impaired tone       PT Treatment Interventions DME instruction;Gait training;Therapeutic activities;Functional mobility training;Therapeutic exercise;Balance training;Neuromuscular re-education;Cognitive remediation;Patient/family education;Wheelchair mobility training    PT Goals (Current goals can be found in the Care Plan section)  Acute Rehab PT Goals Patient Stated Goal: to go home PT Goal Formulation: With patient Time For Goal Achievement: 02/04/22 Potential to Achieve Goals: Fair    Frequency Min 3X/week     Co-evaluation               AM-PAC PT "6 Clicks" Mobility  Outcome Measure Help needed turning from your back to your side while in a flat bed without using bedrails?: A Little Help needed moving from lying on your back to sitting on the side of a flat bed without using bedrails?: A Little Help needed moving to and from a bed to a chair (including a wheelchair)?: A Little Help needed standing up from a chair using your arms (e.g., wheelchair or bedside chair)?: A Little Help needed to walk in hospital room?: Total Help needed climbing 3-5 steps with a railing? : Total 6 Click Score: 14  End of Session   Activity Tolerance: Patient tolerated treatment well Patient left: in bed;with call bell/phone within reach Nurse Communication: Mobility status PT Visit  Diagnosis: Other abnormalities of gait and mobility (R26.89);Muscle weakness (generalized) (M62.81);Hemiplegia and hemiparesis Hemiplegia - Right/Left: Right Hemiplegia - dominant/non-dominant: Dominant Hemiplegia - caused by: Cerebral infarction (chronic from old L MCA CVA)    Time: 4961-1643 PT Time Calculation (min) (ACUTE ONLY): 23 min   Charges:   PT Evaluation $PT Eval Low Complexity: [place], PT, DPT Acute Rehabilitation Office [phone]   Zenaida Niece 01/21/2022, 5:59 PM

## 2022-01-21 NOTE — ED Notes (Signed)
Pt was hypothermic w rectal temp 94.3. EDP made aware. Pt not c/o being cold. Warm blankets applied.

## 2022-01-21 NOTE — Assessment & Plan Note (Addendum)
eliquis metoprolol

## 2022-01-21 NOTE — Telephone Encounter (Signed)
don the occupational therapist for [facility] called and wanted to know if he should have Lorretta Harp sent to the ED. Her bp upon arrival was 112/54 and it is currently 100/46. He stated pt is slurring her words. Per Angela Nevin, I advised the ED.     Fyi

## 2022-01-21 NOTE — ED Provider Notes (Signed)
[facility] EMERGENCY DEPARTMENT Provider Note   CSN: [id number] Arrival date & time: 01/21/22  1117  An emergency department physician performed an initial assessment on this suspected stroke patient at 1115.  History  Chief Complaint  Patient presents with   Code Stroke    SHINIKA Mcclure is a 81 y.o. female.  HPI Known normal 30 81 year old female presents as code stroke with reports that had new left-sided weakness.  Patient with history of prior stroke reported to have had right-sided deficits with.  Patient seen as code stroke at bridge and went to CT with code stroke team.     Home Medications Prior to Admission medications   Medication Sig Start Date End Date Taking? Authorizing Provider  acetaminophen (TYLENOL) 500 MG tablet Take 1,000 mg by mouth every 6 (six) hours as needed for moderate pain.    [provider]  atorvastatin (LIPITOR) 20 MG tablet TAKE 1 TABLET(20 MG) BY MOUTH DAILY Patient taking differently: Take 20 mg by mouth daily. 04/10/21   [name], MD  CALCIUM-VITAMIN D PO Take 1 tablet by mouth daily.    [provider]  clonazePAM (KLONOPIN) 0.5 MG tablet Take 0.5 tablets (0.25 mg total) by mouth 2 (two) times daily as needed for anxiety. Patient not taking: Reported on 01/05/2022 08/19/21   [name], MD  ELIQUIS 2.5 MG TABS tablet TAKE 1 TABLET(2.5 MG) BY MOUTH TWICE DAILY Patient taking differently: Take 2.5 mg by mouth 2 (two) times daily. 11/12/21   [name], MD  FLUoxetine (PROZAC) 40 MG capsule TAKE 1 CAPSULE(40 MG) BY MOUTH DAILY 01/16/22   Hoyt Koch, MD  levothyroxine (SYNTHROID) 75 MCG tablet TAKE 1 TABLET(75 MCG) BY MOUTH DAILY BEFORE BREAKFAST. FOLLOW-UP Strength: 75 mcg 01/19/22   Hoyt Koch, MD  metoprolol tartrate (LOPRESSOR) 25 MG tablet Take 0.5 tablets (12.5 mg total) by mouth 2 (two) times daily. 01/19/22   Hoyt Koch, MD  nortriptyline (PAMELOR) 10 MG  capsule TAKE 4 CAPSULES(40 MG) BY MOUTH AT BEDTIME Patient taking differently: Take 40 mg by mouth at bedtime. 10/09/21   Burns, Claudina Lick, MD  senna-docusate (SENOKOT-S) 8.6-50 MG tablet Take 2 tablets by mouth 2 (two) times daily. 12/17/21   Pokhrel, Corrie Mckusick, MD  tamsulosin (FLOMAX) 0.4 MG CAPS capsule Take 1 capsule (0.4 mg total) by mouth daily. 01/05/22   [name], MD  torsemide (DEMADEX) 10 MG tablet Take 1 tablet (10 mg total) by mouth daily. 01/03/22   Shelly Coss, MD      Allergies    Shrimp [shellfish allergy], Valsartan, Tandem plus [fefum-fepo-fa-b cmp-c-zn-mn-cu], Ivp dye [iodinated contrast media], and Penicillins    Review of Systems   Review of Systems  Physical Exam Updated Vital Signs BP (!) 154/112   Pulse 60   Temp (!) 94.3 F (34.6 C) (Rectal)   Resp 16   SpO2 100%  Physical Exam Vitals and nursing note reviewed.  HENT:     Head: Normocephalic.     Right Ear: External ear normal.     Nose: Nose normal.     Mouth/Throat:     Pharynx: Oropharynx is clear.  Eyes:     Pupils: Pupils are equal, round, and reactive to light.  Cardiovascular:     Rate and Rhythm: Normal rate.  Pulmonary:     Effort: Pulmonary effort is normal.  Abdominal:     Palpations: Abdomen is soft.  Musculoskeletal:  General: No tenderness. Normal range of motion.     Cervical back: Normal range of motion.  Skin:    General: Skin is warm and dry.     Capillary Refill: Capillary refill takes less than 2 seconds.  Neurological:     Mental Status: She is alert.     Cranial Nerves: No cranial nerve deficit.     Sensory: No sensory deficit.     Coordination: Coordination normal.     Deep Tendon Reflexes: Reflexes normal.     Comments: Patient difficult to assess due to fluency of speech Primary obvious palmar drift She is able to move her leg off the bed No ataxia signs noted     ED Results / Procedures / Treatments   Labs (all labs ordered are listed, but only  abnormal results are displayed) Labs Reviewed  PROTIME-INR - Abnormal; Notable for the following components:      Result Value   Prothrombin Time 16.1 (*)    INR 1.3 (*)    All other components within normal limits  APTT - Abnormal; Notable for the following components:   aPTT 38 (*)    All other components within normal limits  CBC - Abnormal; Notable for the following components:   RBC 3.01 (*)    Hemoglobin 9.2 (*)    HCT 28.6 (*)    All other components within normal limits  COMPREHENSIVE METABOLIC PANEL - Abnormal; Notable for the following components:   Glucose, Bld 118 (*)    Creatinine, Ser 1.48 (*)    Total Protein 5.8 (*)    Albumin 2.9 (*)    GFR, Estimated 35 (*)    All other components within normal limits  I-STAT CHEM 8, ED - Abnormal; Notable for the following components:   Creatinine, Ser 1.50 (*)    Glucose, Bld 117 (*)    Hemoglobin 9.5 (*)    HCT 28.0 (*)    All other components within normal limits  CBG MONITORING, ED - Abnormal; Notable for the following components:   Glucose-Capillary 125 (*)    All other components within normal limits  RESP PANEL BY RT-PCR (FLU A&B, COVID) ARPGX2  ETHANOL  DIFFERENTIAL  RAPID URINE DRUG SCREEN, HOSP PERFORMED  URINALYSIS, ROUTINE W REFLEX MICROSCOPIC    EKG None  Radiology CT ANGIO HEAD NECK W WO CM (CODE STROKE)  Result Date: 01/21/2022 CLINICAL DATA:  Patient presented with left-sided weakness and then developed aphasia. History of left MCA stroke. EXAM: CT ANGIOGRAPHY HEAD AND NECK TECHNIQUE: Multidetector CT imaging of the head and neck was performed using the standard protocol during bolus administration of intravenous contrast. Multiplanar CT image reconstructions and MIPs were obtained to evaluate the vascular anatomy. Carotid stenosis measurements (when applicable) are obtained utilizing NASCET criteria, using the distal internal carotid diameter as the denominator. RADIATION DOSE REDUCTION: This exam was  performed according to the departmental dose-optimization program which includes automated exposure control, adjustment of the mA and/or kV according to patient size and/or use of iterative reconstruction technique. CONTRAST:  7m OMNIPAQUE IOHEXOL 350 MG/ML SOLN COMPARISON:  CT head 01/21/2022 FINDINGS: CTA NECK FINDINGS Aortic arch: Mild atherosclerotic calcification aortic arch and proximal great vessels. Proximal great vessels widely patent Right carotid system: Mild atherosclerotic calcification right carotid bifurcation without stenosis. Left carotid system: Atherosclerotic calcification left carotid bifurcation and proximal left internal carotid artery. 25% diameter stenosis proximal left internal carotid artery due to calcific plaque. Vertebral arteries: Both vertebral arteries widely patent and normal  Skeleton: Cervical spondylosis without acute skeletal abnormality. Other neck: 10 mm right thyroid nodule with peripheral calcification. No further imaging recommended. (Ref: J Am Coll Radiol. 2015 Feb;12(2): 143-50). Upper chest: Lung apices clear bilaterally. Review of the MIP images confirms the above findings CTA HEAD FINDINGS Anterior circulation: Mild atherosclerotic calcification in the cavernous carotid bilaterally without stenosis. Anterior and middle cerebral arteries patent without large vessel occlusion. Mild stenosis inferior division left middle cerebral artery. Right MCA widely patent. Anterior cerebral arteries widely patent bilaterally. Posterior circulation: Both vertebral arteries patent to the basilar without stenosis. PICA patent bilaterally. Basilar widely patent. Superior cerebellar and posterior cerebral arteries patent bilaterally without stenosis. Fetal origin left posterior cerebral artery. Venous sinuses: Normal venous enhancement Anatomic variants: None Review of the MIP images confirms the above findings IMPRESSION: 1. Negative for intracranial large vessel occlusion 2. Mild  atherosclerotic disease in the carotid bifurcation without flow limiting stenosis. 25% diameter stenosis proximal left internal carotid artery. 3. These results were called by telephone at the time of interpretation on 01/21/2022 at 11:53 am to provider [facility] , who verbally acknowledged these results. Electronically Signed   By: Franchot Gallo M.D.   On: 01/21/2022 11:54   CT HEAD CODE STROKE WO CONTRAST  Result Date: 01/21/2022 CLINICAL DATA:  Code stroke. Acute neuro deficit. Left-sided weakness and dysarthria. EXAM: CT HEAD WITHOUT CONTRAST TECHNIQUE: Contiguous axial images were obtained from the base of the skull through the vertex without intravenous contrast. RADIATION DOSE REDUCTION: This exam was performed according to the departmental dose-optimization program which includes automated exposure control, adjustment of the mA and/or kV according to patient size and/or use of iterative reconstruction technique. COMPARISON:  CT head 12/13/2021 FINDINGS: Brain: Negative for acute infarct, hemorrhage, mass. Chronic left MCA infarct unchanged. No right-sided infarct identified. Vascular: Negative for hyperdense vessel Skull: Negative Sinuses/Orbits: Paranasal sinuses clear.  Negative orbit Other: None ASPECTS ([place] Stroke Program Early CT Score) - Ganglionic level infarction (caudate, lentiform nuclei, internal capsule, insula, M1-M3 cortex): 7 - Supraganglionic infarction (M4-M6 cortex): 3 Total score (0-10 with 10 being normal): 10 IMPRESSION: 1. Negative for acute abnormality. Chronic left MCA infarct unchanged 2. ASPECTS is 10 3. Code stroke imaging results were communicated on 01/21/2022 at 11:34 am to provider Lorrin Goodell via text page Electronically Signed   By: Franchot Gallo M.D.   On: 01/21/2022 11:34    Procedures Procedures    Medications Ordered in ED Medications  methylPREDNISolone sodium succinate (SOLU-MEDROL) 125 mg/2 mL injection (125 mg  Not Given 01/21/22 1152)   methylPREDNISolone sodium succinate (SOLU-MEDROL) 40 mg/mL injection 40 mg (40 mg Intravenous Given 01/21/22 1153)  diphenhydrAMINE (BENADRYL) capsule 50 mg ( Oral See Alternative 01/21/22 1152)    Or  diphenhydrAMINE (BENADRYL) injection 50 mg (50 mg Intravenous Given 01/21/22 1152)  iohexol (OMNIPAQUE) 350 MG/ML injection 60 mL (60 mLs Intravenous Contrast Given 01/21/22 1141)  sodium chloride 0.9 % bolus 1,000 mL (0 mLs Intravenous Stopped 01/21/22 1241)    ED Course/ Medical Decision Making/ A&P Clinical Course as of 01/21/22 1309  Wed Jan 21, 2022  1129 I-STAT reviewed interpreted and creatinine increased at 1.5 but stable for this patient Anemia noted and again stable for this patient [DR]  1242 CBC reviewed interpreted and with stable anemia [DR]  1243 Comprehensive metabolic panel(!) Complete metabolic panel reviewed and interpreted with stable CKD otherwise electrolytes are normal [DR]  1243 Ethanol EtOH negative [DR]  1244 CT reviewed interpreted and chronic left MCA infarct noted  no other acute abnormalities are noted [DR]    Clinical Course User Index [DR] Pattricia Boss, MD                           Medical Decision Making Patient presented as a code stroke. Patient initially with wrong month and left hand weakness.  CT without acute and developed worsening aphasia. Not candidate for tnk on eliquis, not lvo Please see neuro recommendations for work up  [place] in neuro status Stroke- ischemic vs bleeding Hypoglycemia-sugar normal Seizure -no history of same, no seizure activity noted Metabolic abnormality including electrolyte disruption-electrolytes normal Infection -report of hyporthermia- no other s/s infection noted Tox-etoh negative   Amount and/or Complexity of Data Reviewed Labs: ordered. Decision-making details documented in ED Course. Radiology: ordered and independent interpretation performed. Decision-making details documented in ED Course. ECG/medicine  tests: ordered and independent interpretation performed. Decision-making details documented in ED Course. Discussion of management or test interpretation with external provider(s): Care discussed with Dr. Sarita Haver, on-call for neurology Care discussed with Dr. Eliberto Ivory, on-call for hospitalist Patient's been accepted to Dr. Eliberto Ivory service  Risk Decision regarding hospitalization.      CRITICAL CARE Performed by: Pattricia Boss Total critical care time: 45 minutes Critical care time was exclusive of separately billable procedures and treating other patients. Critical care was necessary to treat or prevent imminent or life-threatening deterioration. Critical care was time spent personally by me on the following activities: development of treatment plan with patient and/or surrogate as well as nursing, discussions with consultants, evaluation of patient's response to treatment, examination of patient, obtaining history from patient or surrogate, ordering and performing treatments and interventions, ordering and review of laboratory studies, ordering and review of radiographic studies, pulse oximetry and re-evaluation of patient's condition. 45       Final Clinical Impression(s) / ED Diagnoses Final diagnoses:  None    Rx / DC Orders ED Discharge Orders     None         Pattricia Boss, MD 01/21/22 1311

## 2022-01-21 NOTE — ED Notes (Signed)
Pt remains hypothermic. MD made aware. Recommended to pt on warmer. Floor nurse made aware.

## 2022-01-21 NOTE — ED Notes (Signed)
Patient transported to MRI 

## 2022-01-21 NOTE — Assessment & Plan Note (Deleted)
Questionable if false reading or if in stool, she denies being cold. SIRs criteria with temp +PCO2 <32 -CXR and UA pending -urine culture -check blood cultures/lactic acid  -no leukocytosis  -check TSH  -repeat and trend  -bear hugger if needed  -could be secondary to age, immobility and neuro issues.

## 2022-01-21 NOTE — Assessment & Plan Note (Addendum)
Right side appears at baseline.  Continue eliquis Continue therapy, likely will d/c home with home health

## 2022-01-21 NOTE — Assessment & Plan Note (Addendum)
Stable, continue prozac and pamelor qhs  Worsened in setting of husbands passing this year, grief Delirium precautions

## 2022-01-21 NOTE — Assessment & Plan Note (Addendum)
Has chronic LE edema, but not overloaded on exam  Recent echo 5/23: showed EF 65-70% with indeterminate diastolic function and normal RV function. Moderate aortic regurgitation.  Torsemide 10 mg daily

## 2022-01-21 NOTE — Code Documentation (Signed)
Stroke Response Nurse Documentation Code Documentation  Valerie Mcclure is a 81 y.o. female arriving to [facility]  via [facility] on 01/21/22 with past medical hx of Afib, HTN, HLD, CVA. On Eliquis (apixaban) daily. Code stroke was activated by EMS.   Patient from home where she was LKW at 0830 at breakfast with a family member when she suddenly started leaning to the left and slurring her speech. Original presentation in the ED she was noted to have slurred speech and was disoriented to the year. After CT head patient was noted to be increasingly aphasic so returned for CTA.  Stroke team at the bedside on patient arrival. Labs drawn and patient cleared for CT by EDP. Patient to CT with team. NIHSS 2 and later increasing to 6, see documentation for details and code stroke times. Patient with dysarthria, aphasia, and disorientation. The following imaging was completed:  CT Head and CTA. Patient is not a candidate for IV Thrombolytic due to inititally being too mild to treat. Patient is not a candidate for IR due to no suspected LVO on CTA.   Care Plan: Q30 until out of window at 1300 then Q2 NIHSS and VS.   Bedside handoff with ED RN Chloe.    Meda Klinefelter  Stroke Response RN

## 2022-01-21 NOTE — Assessment & Plan Note (Deleted)
SIRS criteria met with hypothermia and PCO2 Blood pressures have been soft, but MAP stable CXR with no acute findings UA pending and complains of dysuria. Culture pending. Will treat if UA suspicious Blood cx ordered Lactic acid pending Bear hugger if still hypothermic  Watch pressures closely

## 2022-01-21 NOTE — Assessment & Plan Note (Addendum)
At baseline of creatinine 1.4 stable Continue to monitor

## 2022-01-21 NOTE — Consult Note (Signed)
NEUROLOGY CONSULTATION NOTE   Date of service: January 21, 2022 Patient Name: Valerie Mcclure MRN:  [id number] DOB:  06/29/1941 Reason for consult: "L sided weakness, stroke code activated by EMS" Requesting Provider: Pattricia Boss, MD _ _ _   _ __   _ __ _ _  __ __   _ __   __ _  History of Present Frazee is a 81 y.o. female with PMH significant for low back pain, afibb on eliquis, HTN, HLD, prior L MCA stroke with mild R sided weakness who was with family this AM and developed sudden onset L sided weakness and slurred speech. She was dragging her foot per EMS. She was brought in to the ED as a code stroke. On arrival, patient was able to tell me that she took her eliquis this AM and last night. Her initial NIHSS was a 2 for dysarthric speech and did not know the month. CTH with no acute intracranial abnormalities. She was being wheeled out of the scanner and on the way to the ED bed when she developed aphasia that was not present earlier along with worsening slurred speech. She was taken back to the CT Scanner and CT Angio was obtained which was negative for an LVO.  LKW: 0830 on 01/21/22. mRS: 3 tNKASE: not offered, contraindicated 2/2 eliquis. Thrombectomy: not offered, no LVO  Initial NIHSS: NIHSS components Score: Comment  1a Level of Conscious 0_0  1_1  2_2  3_3      1b LOC Questions 0_4  1_5  2_6       1c LOC Commands 0_7  1_8  2_9       2 Best Gaze 0_10  1_11  2_12       3 Visual 0_13  1_14  2_15  3_16      4 Facial Palsy 0_17  1_18  2_19  3_20      5a Motor Arm - left 0_21  1_22  2_23  3_24  4_25  UN_26    5b Motor Arm - Right 0_27  1_28  2_29  3_30  4_31  UN_32    6a Motor Leg - Left 0_33  1_34  2_35  3_36  4_37  UN_38    6b Motor Leg - Right 0_39  1_40  2_41  3_42  4_43  UN_44    7 Limb Ataxia 0_45  1_46  2_47  3_48  UN_49     8 Sensory 0_50  1_51  2_52  UN_53      9 Best Language 0_54  1_55  2_56  3_57      10 Dysarthria 0_58  1_59  2_60  UN_61      11 Extinct. and Inattention 0_62  1_63  2_64       TOTAL:      NIHSS after CTH and before CTA: NIHSS  components Score: Comment  1a Level of Conscious 0_65  1_66  2_67  3_68      1b LOC Questions 0_69  1_70  2_71       1c LOC Commands 0_72  1_73  2_74       2 Best Gaze 0_75  1_76  2_77       3 Visual 0_78  1_79  2_80  3_81      4 Facial Palsy 0_82  1_83  2_84  3_85      5a Motor Arm - left 0_86  1_87  2_88  3_89  4_90  UN_91    5b Motor Arm - Right 0_92  1_93  2_94  3_95  4_96  UN_97    6a Motor Leg - Left 0_98  1_99  2_100  3_101  4_102  UN_103    6b Motor Leg - Right 0_104  1_105  2_106  3_107  4_108  UN_109    7 Limb Ataxia 0_110  1_111  2_112  3_113  UN_114     8 Sensory 0_115  1_116  2_117  UN_118      9 Best Language 0_119  1_120  2_121  3_122      10 Dysarthria 0_123  1_124  2_125  UN_126      11 Extinct. and Inattention 0_127   1_0  2_1       TOTAL: 6     ROS   Unable to get detailed ROS 2/2 aphasia.  Past History   Past Medical History:  Diagnosis Date   Blood transfusion without reported diagnosis    Chronic low back pain    History of echocardiogram    Echo 5/18: EF 55-60, Gr 2 DD, mild MR, mild LAE, PASP 47 // Echo 1/18:  EF 55, mild AI, MAC, mild MR, mild LAE, trivial pericardial effusion   History of ischemic left MCA stroke 11/2016   left MCA infarct status post mechanical thrombectomy complicated by large left basal ganglia hemorrhage with right shift.   HTN (hypertension)    Hyperlipidemia    Hypothyroidism    Osteopenia    Persistent atrial fibrillation ([name])    CHADS2-VASc=6 (female, age 28, HTN, CVA) // Apixaban // rate control strategy    Sacral wound    chronic sacral wound   Past Surgical History:  Procedure Laterality Date   ABDOMINAL HYSTERECTOMY  1970   CHOLECYSTECTOMY  07/2009   Dr. Rise Patience   COLONOSCOPY  2003   FLEXIBLE SIGMOIDOSCOPY  2010   HAND SURGERY     IR ANGIO INTRA EXTRACRAN SEL COM CAROTID INNOMINATE UNI L MOD SED  11/29/2016   IR ANGIO VERTEBRAL SEL SUBCLAVIAN INNOMINATE UNI R MOD SED  11/29/2016   IR PERCUTANEOUS ART THROMBECTOMY/INFUSION INTRACRANIAL INC DIAG ANGIO  11/29/2016   IR RADIOLOGIST EVAL & MGMT  03/24/2017   LUMBAR LAMINECTOMY  11/2008   Done by Dr. Patrice Paradise    RADIOLOGY WITH ANESTHESIA N/A 11/29/2016   Procedure: RADIOLOGY WITH ANESTHESIA;  Surgeon: Radiologist, Medication, MD;  Location: [place] [facility];  Service: Radiology;  Laterality: N/A;   THYROIDECTOMY     Family History  Problem Relation Age of Onset   Heart disease Father 41       MI age 34s   Lung cancer Brother 24   Colon cancer Neg Hx    Esophageal cancer Neg Hx    Rectal cancer Neg Hx    Stomach cancer Neg Hx    Social History   Socioeconomic History   Marital status: Married    Spouse name: Not on file   Number of children: 1   Years of education: Not on file   Highest education level: Not on file  Occupational History   Occupation: retired  Tobacco Use   Smoking status: Never   Smokeless tobacco: Never  Vaping Use   Vaping Use: Never used  Substance and Sexual Activity   Alcohol use: No   Drug use: No   Sexual activity: Not Currently  Other Topics Concern   Not on file  Social History Narrative   Married   Social Determinants of Health   Financial Resource Strain: Low Risk  (06/25/2020)   Overall Financial Resource Strain (CARDIA)    Difficulty of Paying Living Expenses: Not hard at all  Food Insecurity: No Food Insecurity (11/11/2021)   Hunger Vital Sign    Worried About Running Out of Food in the Last Year: Never true    [place] in the Last Year: Never true  Transportation Needs: No Transportation Needs (11/11/2021)   PRAPARE - Hydrologist (Medical): No    Lack of Transportation (Non-Medical): No  Physical Activity: Sufficiently Active (06/25/2020)   Exercise Vital Sign    Days of Exercise per Week: 5 days    Minutes of Exercise per Session:  30 min  Stress: No Stress Concern Present (06/25/2020)   [place]    Feeling of Stress : Not at all  Social Connections: [place] (06/25/2020)   Social Connection and Isolation Panel [NHANES]    Frequency of  Communication with Friends and Family: More than three times a week    Frequency of Social Gatherings with Friends and Family: Once a week    Attends Religious Services: More than 4 times per year    Active Member of [employer] or Organizations: No    Attends Music therapist: More than 4 times per year    Marital Status: Married   Allergies  Allergen Reactions   Shrimp [Shellfish Allergy] Anaphylaxis and Rash    Break outs and swelling of the throat   Valsartan Other (See Comments)    Renal failure   Tandem Plus [Fefum-Fepo-Fa-B Cmp-C-Zn-Mn-Cu] Nausea And Vomiting   Ivp Dye [Iodinated Contrast Media] Rash    itching   Penicillins Itching and Rash    Has patient had a PCN reaction causing immediate rash, facial/tongue/throat swelling, SOB or lightheadedness with hypotension: Yes Has patient had a PCN reaction causing severe rash involving mucus membranes or skin necrosis: No Has patient had a PCN reaction that required hospitalization: No Has patient had a PCN reaction occurring within the last 10 years: No If all of the above answers are "NO", then may proceed with Cephalosporin use.     Medications  (Not in a hospital admission)    Vitals   Vitals:   01/21/22 1149 01/21/22 1150  BP: (!) 106/33 (!) 106/33  Pulse: (!) 59   Resp: 13   SpO2: 100%      There is no height or weight on file to calculate BMI.  Physical Exam   General: Laying comfortably in bed; in no acute distress.  HENT: Normal oropharynx and mucosa. Normal external appearance of ears and nose.  Neck: Supple, no pain or tenderness  CV: No JVD. No peripheral edema.  Pulmonary: Symmetric Chest rise. Normal respiratory effort.  Abdomen: Soft to touch, non-tender.  Ext: No cyanosis, edema, or deformity  Skin: No rash. Normal palpation of skin.   Musculoskeletal: Normal digits and nails by inspection. No clubbing.   Neurologic Examination  Mental status/Cognition: Alert, oriented to self,  difficult to assess 2/2 aphasia. Good attention. Speech/language: Fluent, gets stuck on words and makes paraphasic errors. comprehension intact to some commands, names some objects but not all, makes errors with repetition. Cranial nerves:   CN II Pupils equal and reactive to light, no VF deficits    CN III,IV,VI EOM intact, no gaze preference or deviation, no nystagmus    CN V normal sensation in V1, V2, and V3 segments bilaterally    CN VII no asymmetry, no nasolabial fold flattening    CN VIII normal hearing to speech    CN IX & X normal palatal elevation, no uvular deviation    CN XI 5/5 head turn and 5/5 shoulder shrug bilaterally    CN XII midline tongue protrusion    Motor:  Muscle bulk: poor, tone normal, pronator drift: none tremor none Mvmt Root Nerve  Muscle Right Left Comments  SA C5/6 Ax Deltoid 5 5   EF C5/6 Mc Biceps 5 5   EE C6/7/8 Rad Triceps 5 5   WF C6/7 Med FCR     WE C7/8 PIN ECU     F Ab C8/T1 U  ADM/FDI 4+ 5   HF L1/2/3 Fem Illopsoas 4 4   KE L2/3/4 Fem Quad 5 5   DF L4/5 D Peron Tib Ant 5 5   PF S1/2 Tibial Grc/Sol 5 5    Sensation:  Light touch Intact throughout   Pin prick    Temperature    Vibration   Proprioception    Coordination/Complex Motor:  - Finger to Nose intact BL - Heel to shin unable to do due to swelling in her legs BL - Rapid alternating movement are normal - Gait: deferred for patient safety.  Labs   CBC:  Recent Labs  Lab 01/21/22 1115 01/21/22 1119  WBC 5.2  --   NEUTROABS 3.2  --   HGB 9.2* 9.5*  HCT 28.6* 28.0*  MCV 95.0  --   PLT 218  --     Basic Metabolic Panel:  Lab Results  Component Value Date   NA 135 01/21/2022   K 3.5 01/21/2022   CO2 19 (L) 01/03/2022   GLUCOSE 117 (H) 01/21/2022   BUN 19 01/21/2022   CREATININE 1.50 (H) 01/21/2022   CALCIUM 8.0 (L) 01/03/2022   GFRNONAA 34 (L) 01/03/2022   GFRAA 42 (L) 07/13/2017   Lipid Panel:  Lab Results  Component Value Date   LDLCALC 40 12/09/2021    HgbA1c:  Lab Results  Component Value Date   HGBA1C 6.6 (H) 10/13/2021   Urine Drug Screen:     Component Value Date/Time   LABOPIA POSITIVE (A) 01/12/2017 0053   COCAINSCRNUR NONE DETECTED 01/12/2017 0053   LABBENZ NONE DETECTED 01/12/2017 0053   AMPHETMU NONE DETECTED 01/12/2017 0053   THCU NONE DETECTED 01/12/2017 0053   LABBARB NONE DETECTED 01/12/2017 0053    Alcohol Level     Component Value Date/Time   ETH <10 07/12/2017 1755    CT Head without contrast(Personally reviewed): CTH was negative for a large hypodensity concerning for a large territory infarct or hyperdensity concerning for an ICH. Chronic L MCA stroke.   CT angio Head and Neck with contrast(Personally reviewed): No LVO.  MRI Brain(Personally reviewed): pending  rEEG:  pending  Impression   HIBBA SCHRAM is a 81 y.o. female with PMH significant for low back pain, afibb on eliquis, HTN, HLD, prior L MCA stroke with mild R sided weakness who presented with L sided weakness which was significantly improved and then resolved right after she presented to the ED and then developed aphasia in the ED. She has had a prior stroke with residual R sided weaknes and midl aphasia but this was a sudden change. CTH and CTA negative for a large territory stroke, no ICH, no LVO. She was not offered tNKASE as she is on Eliquis. She was not offered thrombectomy 2/2 no LVO.  Primary Diagnosis:  Cerebral infarction, unspecified.  Secondary Diagnosis: Essential (primary) hypertension and Chronic atrial fibrillation  Recommendations   - Frequent Neuro checks per stroke unit protocol - Recommend brain imaging with MRI Brain without contrast - Recommend obtaining TTE - Recommend obtaining Lipid panel with LDL - Please start statin if LDL > 70 - Recommend HbA1c - continue eliquis. - SBP goal - permissive hypertension first 24 h < 220/110. Held home meds.  - Recommend Telemetry monitoring for arrythmia -  Recommend bedside swallow screen prior to PO intake. - Stroke education booklet - Recommend PT/OT/SLP consult - Recommend routine EEG in addition to stroke workup.  _____________________________________________________________________  This patient is critically ill and at significant risk  of neurological worsening, death and care requires constant monitoring of vital signs, hemodynamics,respiratory and cardiac monitoring, neurological assessment, discussion with family, other specialists and medical decision making of high complexity. I spent 40 minutes of neurocritical care time  in the care of  this patient. This was time spent independent of any time provided by nurse practitioner or PA.  Donnetta Simpers [facility] Pager Number [phone] 01/21/2022  12:21 PM   Thank you for the opportunity to take part in the care of this patient. If you have any further questions, please contact the neurology consultation attending.  Signed,  [place] Pager Number [phone] _ _ _   _ __   _ __ _ _  __ __   _ __   __ _

## 2022-01-21 NOTE — Assessment & Plan Note (Addendum)
Synthroid at current dose Repeat TFT outpatient

## 2022-01-21 NOTE — Telephone Encounter (Signed)
Katherin Meeder, [facility] home health nurse, would like [place] to send orders for the pt to get a wheelchair cushion, or a gel cushion.   Pt already has the wheelchair, but it is too uncomfortable to sit in for long stretches of time, so she has been sitting in a recliner most of the time. [facility] nurse thinks it would be beneficial for her to be able to sit up straighter, and therefore is requesting the cushion.   Belenda Cruise: [phone]

## 2022-01-21 NOTE — Telephone Encounter (Signed)
Spoke with Belenda Cruise today.  Order placed today for gel cushion.

## 2022-01-22 DIAGNOSIS — I5032 Chronic diastolic (congestive) heart failure: Secondary | ICD-10-CM | POA: Diagnosis present

## 2022-01-22 DIAGNOSIS — E039 Hypothyroidism, unspecified: Secondary | ICD-10-CM | POA: Diagnosis present

## 2022-01-22 DIAGNOSIS — G9341 Metabolic encephalopathy: Secondary | ICD-10-CM | POA: Diagnosis present

## 2022-01-22 DIAGNOSIS — I351 Nonrheumatic aortic (valve) insufficiency: Secondary | ICD-10-CM | POA: Diagnosis present

## 2022-01-22 DIAGNOSIS — F3289 Other specified depressive episodes: Secondary | ICD-10-CM

## 2022-01-22 DIAGNOSIS — N390 Urinary tract infection, site not specified: Secondary | ICD-10-CM | POA: Diagnosis present

## 2022-01-22 DIAGNOSIS — E7849 Other hyperlipidemia: Secondary | ICD-10-CM | POA: Diagnosis not present

## 2022-01-22 DIAGNOSIS — I69351 Hemiplegia and hemiparesis following cerebral infarction affecting right dominant side: Secondary | ICD-10-CM

## 2022-01-22 DIAGNOSIS — I130 Hypertensive heart and chronic kidney disease with heart failure and stage 1 through stage 4 chronic kidney disease, or unspecified chronic kidney disease: Secondary | ICD-10-CM | POA: Diagnosis present

## 2022-01-22 DIAGNOSIS — R6510 Systemic inflammatory response syndrome (SIRS) of non-infectious origin without acute organ dysfunction: Secondary | ICD-10-CM | POA: Diagnosis not present

## 2022-01-22 DIAGNOSIS — B9689 Other specified bacterial agents as the cause of diseases classified elsewhere: Secondary | ICD-10-CM | POA: Diagnosis present

## 2022-01-22 DIAGNOSIS — E1122 Type 2 diabetes mellitus with diabetic chronic kidney disease: Secondary | ICD-10-CM | POA: Diagnosis present

## 2022-01-22 DIAGNOSIS — Z66 Do not resuscitate: Secondary | ICD-10-CM | POA: Diagnosis present

## 2022-01-22 DIAGNOSIS — I6522 Occlusion and stenosis of left carotid artery: Secondary | ICD-10-CM | POA: Diagnosis present

## 2022-01-22 DIAGNOSIS — D631 Anemia in chronic kidney disease: Secondary | ICD-10-CM | POA: Diagnosis present

## 2022-01-22 DIAGNOSIS — N1832 Chronic kidney disease, stage 3b: Secondary | ICD-10-CM | POA: Diagnosis present

## 2022-01-22 DIAGNOSIS — I69354 Hemiplegia and hemiparesis following cerebral infarction affecting left non-dominant side: Secondary | ICD-10-CM | POA: Diagnosis not present

## 2022-01-22 DIAGNOSIS — E119 Type 2 diabetes mellitus without complications: Secondary | ICD-10-CM | POA: Diagnosis not present

## 2022-01-22 DIAGNOSIS — I10 Essential (primary) hypertension: Secondary | ICD-10-CM | POA: Diagnosis not present

## 2022-01-22 DIAGNOSIS — L89152 Pressure ulcer of sacral region, stage 2: Secondary | ICD-10-CM | POA: Diagnosis present

## 2022-01-22 DIAGNOSIS — Z20822 Contact with and (suspected) exposure to covid-19: Secondary | ICD-10-CM | POA: Diagnosis present

## 2022-01-22 DIAGNOSIS — F339 Major depressive disorder, recurrent, unspecified: Secondary | ICD-10-CM | POA: Diagnosis not present

## 2022-01-22 DIAGNOSIS — I700 Atherosclerosis of aorta: Secondary | ICD-10-CM | POA: Diagnosis present

## 2022-01-22 DIAGNOSIS — D638 Anemia in other chronic diseases classified elsewhere: Secondary | ICD-10-CM | POA: Diagnosis not present

## 2022-01-22 DIAGNOSIS — A4159 Other Gram-negative sepsis: Secondary | ICD-10-CM | POA: Diagnosis present

## 2022-01-22 DIAGNOSIS — R2990 Unspecified symptoms and signs involving the nervous system: Secondary | ICD-10-CM | POA: Diagnosis not present

## 2022-01-22 DIAGNOSIS — Z7901 Long term (current) use of anticoagulants: Secondary | ICD-10-CM | POA: Diagnosis not present

## 2022-01-22 DIAGNOSIS — I4819 Other persistent atrial fibrillation: Secondary | ICD-10-CM

## 2022-01-22 DIAGNOSIS — F329 Major depressive disorder, single episode, unspecified: Secondary | ICD-10-CM | POA: Diagnosis present

## 2022-01-22 DIAGNOSIS — R471 Dysarthria and anarthria: Secondary | ICD-10-CM | POA: Diagnosis present

## 2022-01-22 DIAGNOSIS — T68XXXA Hypothermia, initial encounter: Secondary | ICD-10-CM | POA: Diagnosis not present

## 2022-01-22 DIAGNOSIS — Z1629 Resistance to other single specified antibiotic: Secondary | ICD-10-CM | POA: Diagnosis present

## 2022-01-22 DIAGNOSIS — I69322 Dysarthria following cerebral infarction: Secondary | ICD-10-CM | POA: Diagnosis not present

## 2022-01-22 DIAGNOSIS — I69320 Aphasia following cerebral infarction: Secondary | ICD-10-CM | POA: Diagnosis not present

## 2022-01-22 LAB — LIPID PANEL
Cholesterol: 94 mg/dL (ref 0–200)
HDL: 46 mg/dL (ref >40)
LDL Cholesterol: 35 mg/dL (ref 0–99)
Total CHOL/HDL Ratio: 2 RATIO
Triglycerides: 66 mg/dL (ref <150)
VLDL: 13 mg/dL (ref 0–40)

## 2022-01-22 LAB — CBC
HCT: 29.1 % — ABNORMAL LOW (ref 36.0–46.0)
Hemoglobin: 9.1 g/dL — ABNORMAL LOW (ref 12.0–15.0)
MCH: 29.4 pg (ref 26.0–34.0)
MCHC: 31.3 g/dL (ref 30.0–36.0)
MCV: 94.2 fL (ref 80.0–100.0)
Platelets: 227 10*3/uL (ref 150–400)
RBC: 3.09 MIL/uL — ABNORMAL LOW (ref 3.87–5.11)
RDW: 15.4 % (ref 11.5–15.5)
WBC: 4.2 10*3/uL (ref 4.0–10.5)
nRBC: 0 % (ref 0.0–0.2)

## 2022-01-22 LAB — GLUCOSE, CAPILLARY
Glucose-Capillary: 111 mg/dL — ABNORMAL HIGH (ref 70–99)
Glucose-Capillary: 99 mg/dL (ref 70–99)
Glucose-Capillary: 157 mg/dL — ABNORMAL HIGH (ref 70–99)
Glucose-Capillary: 94 mg/dL (ref 70–99)

## 2022-01-22 LAB — BASIC METABOLIC PANEL
Anion gap: 12 (ref 5–15)
BUN: 17 mg/dL (ref 8–23)
CO2: 23 mmol/L (ref 22–32)
Calcium: 9 mg/dL (ref 8.9–10.3)
Chloride: 103 mmol/L (ref 98–111)
Creatinine, Ser: 1.43 mg/dL — ABNORMAL HIGH (ref 0.44–1.00)
GFR, Estimated: 37 mL/min — ABNORMAL LOW (ref >60)
Glucose, Bld: 136 mg/dL — ABNORMAL HIGH (ref 70–99)
Potassium: 4.2 mmol/L (ref 3.5–5.1)
Sodium: 138 mmol/L (ref 135–145)

## 2022-01-22 LAB — HEMOGLOBIN A1C
Hgb A1c MFr Bld: 5.8 % — ABNORMAL HIGH (ref 4.8–5.6)
Mean Plasma Glucose: 119.76 mg/dL

## 2022-01-22 MED ORDER — OLANZAPINE 2.5 MG PO TABS
5.0000 mg | ORAL_TABLET | Freq: Every evening | ORAL | Status: DC | PRN
  Administered 2022-01-22: 5 mg via ORAL
  Filled 2022-01-22: qty 2

## 2022-01-22 MED ORDER — METOPROLOL TARTRATE 25 MG PO TABS
25.0000 mg | ORAL_TABLET | Freq: Two times a day (BID) | ORAL | Status: DC
  Administered 2022-01-22 – 2022-01-26 (×8): 25 mg via ORAL
  Filled 2022-01-22 (×9): qty 1

## 2022-01-22 MED ORDER — TRAZODONE HCL 50 MG PO TABS
50.0000 mg | ORAL_TABLET | Freq: Every day | ORAL | Status: DC
  Administered 2022-01-22: 50 mg via ORAL
  Filled 2022-01-22: qty 1

## 2022-01-22 NOTE — Progress Notes (Signed)
Progress Note  Patient: Valerie Mcclure HYW:[id number] DOB: 1941-04-20  DOA: 01/21/2022  DOS: 01/22/2022    Brief hospital course: Valerie Mcclure is an 81 y.o. female with a history of CVA, residual right-sided weakness, dysarthria, HTN, HLD, chronic HFpEF, T2DM, stage IIIb CKD, atrial fibrillation who presented to the ED 7/5 with acute worsening of aphasia that day and dysuria beginning the prior evening at home.    CT head without acute abnormality, no LVO on CTA head/neck, and MR brain showed no acute intracranial pathology, unchanged remote left MCA distribution infarct.   She was hypothermic, and remained altered, found to have pyuria and bacteriuria for which ceftriaxone was started and urine culture sent.    Assessment and Plan: Stroke-like symptoms: Has had repeatedly negative neuroimaging, including this admission. Had recent echo, so not repeating at this time.  - Continue eliquis, statin. D/w Dr. [name].      Sepsis due to UTI with acute metabolic encephalopathy on chronic memory deficit: With hypothermia, hypotension. Note very similar presentation in May 2023. - Continue ceftriaxone.  - Monitor cultures: Addendum, Citrobacter growing on urine culture, will have to await susceptibilities. - Check cortisol in AM - Continue home meds, add trazodone qHS and prn zyprexa to help regulate day-night cycle.  History of left MCA CVA with residual right-sided hemiplegia, dysarthria: At baseline functionally.  - Plan to resume HH PT, OT, RN at discharge based on reassessments this admission.   Persistent atrial fibrillation: Currently in NSR. - Continue metoprolol - Continue eliquis   Chronic HFpEF: Recent echo 5/23: showed EF 65-70% with indeterminate diastolic function and normal RV function. Moderate aortic regurgitation. Has chronic LE edema which is at approximate baseline based on my prior assessments.  - No changes to medications planned at this time.  - If BP rises more,  will restart demadex '10mg'$  qAM.    Depression: Worsened by husband's passing earlier this year.  - Continue SSRI, nortriptyline qHS. Add trazodone tonight.    Well-controlled NIDT2DM: HbA1c 5.8%. - Continue SSI   Hypertension - Holding home medication initially, will restart with BP 150/113.   Hyperlipidemia: LDL 35.  Continue lipitor.    Hypothyroidism: TSH checked 6/14 and elevated, remains elevated in 5.1-5.9 range.  - Check free T3, free T4 in setting of hypothermia, consider mild dose increase in thyroid hormone supplementation if low.    Stage 3b CKD: Near baseline.  - Avoid nephrotoxins.   Anemia of chronic disease: Hgb 9.2 stable - Monitor intermittently.  Stage 2 right sacral pressure injury, POA:  - Offload, optimize mobility and nutritional status.    Subjective: Pt alert and interactive this morning, later in the day became agitated. She hasn't slept for over the past day. She denies any new numbness or weakness this AM. Speech sounds the same to me as it did during prior admission.  Objective: Vitals:   01/22/22 0400 01/22/22 0754 01/22/22 1142 01/22/22 1608  BP: (!) 119/47 (!) 102/40 110/60 (!) 150/113  Pulse: 80 79 77 69  Resp:  '16 16 18  '$ Temp: 97.8 F (36.6 C) 97.7 F (36.5 C) (!) 97.5 F (36.4 C) 97.8 F (36.6 C)  TempSrc: Oral Oral Oral Oral  SpO2: 94% 98% 95% 100%   Gen: Frail elderly female in no distress Pulm: Nonlabored breathing room air. Upper airway wheeze intermittently CV: Regular rate and rhythm. + murmur, rub, or gallop. No JVD, stable LE dependent edema. GI: Abdomen soft, non-tender, non-distended, with normoactive bowel sounds.  Ext:  Warm, spastic right hemiplegia without other deformities Skin: No rashes, lesions or ulcers on visualized skin. Neuro: Alert and pleasantly confused with stable right sided weakness, dysarthria, and no new focal neurological deficits. Psych: Judgement and insight appear impaired. Mood euthymic & affect  congruent. Behavior is appropriate this AM, agitated this PM.    Data Personally reviewed: CBC: Recent Labs  Lab 01/21/22 1115 01/21/22 1119 01/22/22 0044  WBC 5.2  --  4.2  NEUTROABS 3.2  --   --   HGB 9.2* 9.5* 9.1*  HCT 28.6* 28.0* 29.1*  MCV 95.0  --  94.2  PLT 218  --  662   Basic Metabolic Panel: Recent Labs  Lab 01/21/22 1115 01/21/22 1119 01/22/22 0044  NA 138 135 138  K 3.6 3.5 4.2  CL 102 101 103  CO2 24  --  23  GLUCOSE 118* 117* 136*  BUN '19 19 17  '$ CREATININE 1.48* 1.50* 1.43*  CALCIUM 9.3  --  9.0   GFR: Estimated Creatinine Clearance: 31.5 mL/min (A) (by C-G formula based on SCr of 1.43 mg/dL (H)). Liver Function Tests: Recent Labs  Lab 01/21/22 1115  AST 21  ALT 14  ALKPHOS 71  BILITOT 0.5  PROT 5.8*  ALBUMIN 2.9*   No results for input(s): "LIPASE", "AMYLASE" in the last 168 hours. No results for input(s): "AMMONIA" in the last 168 hours. Coagulation Profile: Recent Labs  Lab 01/21/22 1115  INR 1.3*   Cardiac Enzymes: No results for input(s): "CKTOTAL", "CKMB", "CKMBINDEX", "TROPONINI" in the last 168 hours. BNP (last 3 results) No results for input(s): "PROBNP" in the last 8760 hours. HbA1C: Recent Labs    01/22/22 0044  HGBA1C 5.8*   CBG: Recent Labs  Lab 01/21/22 1117 01/21/22 1750 01/22/22 0640 01/22/22 1140 01/22/22 1607  GLUCAP 125* 147* 111* 99 157*   Lipid Profile: Recent Labs    01/22/22 0044  CHOL 94  HDL 46  LDLCALC 35  TRIG 66  CHOLHDL 2.0   Thyroid Function Tests: Recent Labs    01/21/22 1452  TSH 5.974*   Anemia Panel: No results for input(s): "VITAMINB12", "FOLATE", "FERRITIN", "TIBC", "IRON", "RETICCTPCT" in the last 72 hours. Urine analysis:    Component Value Date/Time   COLORURINE YELLOW 01/21/2022 1500   APPEARANCEUR HAZY (A) 01/21/2022 1500   LABSPEC 1.013 01/21/2022 1500   PHURINE 5.0 01/21/2022 1500   GLUCOSEU NEGATIVE 01/21/2022 1500   GLUCOSEU NEGATIVE 11/18/2010 0859   HGBUR  SMALL (A) 01/21/2022 1500   HGBUR negative 12/13/2008 0944   BILIRUBINUR NEGATIVE 01/21/2022 1500   KETONESUR NEGATIVE 01/21/2022 1500   PROTEINUR NEGATIVE 01/21/2022 1500   UROBILINOGEN 0.2 11/18/2010 0859   NITRITE POSITIVE (A) 01/21/2022 1500   LEUKOCYTESUR LARGE (A) 01/21/2022 1500   Recent Results (from the past 240 hour(s))  Resp Panel by RT-PCR (Flu A&B, Covid) Anterior Nasal Swab     Status: None   Collection Time: 01/21/22 11:18 AM   Specimen: Anterior Nasal Swab  Result Value Ref Range Status   SARS Coronavirus 2 by RT PCR NEGATIVE NEGATIVE Final    Comment: (NOTE) SARS-CoV-2 target nucleic acids are NOT DETECTED.  The SARS-CoV-2 RNA is generally detectable in upper respiratory specimens during the acute phase of infection. The lowest concentration of SARS-CoV-2 viral copies this assay can detect is 138 copies/mL. A negative result does not preclude SARS-Cov-2 infection and should not be used as the sole basis for treatment or other patient management decisions. A negative result may occur  with  improper specimen collection/handling, submission of specimen other than nasopharyngeal swab, presence of viral mutation(s) within the areas targeted by this assay, and inadequate number of viral copies(<138 copies/mL). A negative result must be combined with clinical observations, patient history, and epidemiological information. The expected result is Negative.  Fact Sheet for Patients:  EntrepreneurPulse.com.au  Fact Sheet for Healthcare Providers:  IncredibleEmployment.be  This test is no t yet approved or cleared by the [place] FDA and  has been authorized for detection and/or diagnosis of SARS-CoV-2 by FDA under an Emergency Use Authorization (EUA). This EUA will remain  in effect (meaning this test can be used) for the duration of the COVID-19 declaration under Section 564(b)(1) of the Act, 21 U.S.C.section 360bbb-3(b)(1),  unless the authorization is terminated  or revoked sooner.       Influenza A by PCR NEGATIVE NEGATIVE Final   Influenza B by PCR NEGATIVE NEGATIVE Final    Comment: (NOTE) The Xpert Xpress SARS-CoV-2/FLU/RSV plus assay is intended as an aid in the diagnosis of influenza from Nasopharyngeal swab specimens and should not be used as a sole basis for treatment. Nasal washings and aspirates are unacceptable for Xpert Xpress SARS-CoV-2/FLU/RSV testing.  Fact Sheet for Patients: EntrepreneurPulse.com.au  Fact Sheet for Healthcare Providers: IncredibleEmployment.be  This test is not yet approved or cleared by the [place] FDA and has been authorized for detection and/or diagnosis of SARS-CoV-2 by FDA under an Emergency Use Authorization (EUA). This EUA will remain in effect (meaning this test can be used) for the duration of the COVID-19 declaration under Section 564(b)(1) of the Act, 21 U.S.C. section 360bbb-3(b)(1), unless the authorization is terminated or revoked.  Performed at [facility], [place] [address], [place], [place] [address]   Urine Culture     Status: Abnormal (Preliminary result)   Collection Time: 01/21/22  2:14 PM   Specimen: Urine, Clean Catch  Result Value Ref Range Status   Specimen Description URINE, CLEAN CATCH  Final   Special Requests NONE  Final   Culture (A)  Final    >=100,000 COLONIES/mL CITROBACTER AMALONATICUS SUSCEPTIBILITIES TO FOLLOW Performed at [facility], [place] [address] [place], [place] [address]    Report Status PENDING  Incomplete  Culture, blood (Routine X 2) w Reflex to ID Panel     Status: None (Preliminary result)   Collection Time: 01/21/22  2:27 PM   Specimen: BLOOD  Result Value Ref Range Status   Specimen Description BLOOD RIGHT ANTECUBITAL  Final   Special Requests   Final    BOTTLES DRAWN AEROBIC AND ANAEROBIC Blood Culture adequate volume   Culture   Final    NO GROWTH <  24 HOURS Performed at [facility], [place] [address] [place], [place] [address]    Report Status PENDING  Incomplete  Culture, blood (Routine X 2) w Reflex to ID Panel     Status: None (Preliminary result)   Collection Time: 01/21/22  7:56 PM   Specimen: BLOOD  Result Value Ref Range Status   Specimen Description BLOOD RIGHT ANTECUBITAL  Final   Special Requests   Final    BOTTLES DRAWN AEROBIC AND ANAEROBIC Blood Culture adequate volume   Culture   Final    NO GROWTH < 24 HOURS Performed at [facility], [place] [address], [place], [place] [address]    Report Status PENDING  Incomplete     MR BRAIN WO CONTRAST  Result Date: 01/21/2022 CLINICAL  DATA:  New left-sided weakness with history of stroke. EXAM: MRI HEAD WITHOUT CONTRAST TECHNIQUE: Multiplanar, multiecho pulse sequences of the brain and surrounding structures were obtained without intravenous contrast. COMPARISON:  Same-day CTA head/neck MRI head 12/09/2018, CTA head/neck 11/29/2016 FINDINGS: Brain: There is no evidence of acute intracranial hemorrhage, extra-axial fluid collection, or acute infarct. Encephalomalacia in the left basal ganglia and temporal lobe in the MCA distribution with associated chronic blood products is unchanged consistent with prior infarct. There is unchanged associated ex vacuo dilatation of the left lateral ventricle and wallerian degeneration in the left cerebral peduncle. The ventricles are otherwise normal in size and configuration. Background parenchymal volume is otherwise normal. There is no significant burden of underlying chronic white matter disease. There is no mass lesion.  There is no mass effect or midline shift. Vascular: Normal flow voids. Skull and upper cervical spine: Normal marrow signal. Sinuses/Orbits: The paranasal sinuses are clear. The globes and orbits are unremarkable. Other: There is a 1.5 cm T1 hypointense, diffusion restricting lesion in the right parotid gland. The lesion was  present in 2018 but has slightly increased in size. IMPRESSION: 1. No acute intracranial pathology. 2. Unchanged remote infarct with associated chronic blood products in the left MCA distribution. 3. 1.5 cm lesion in the right parotid gland has slightly increased in size since 2018, favored to be benign in etiology given slow growth. Recommend nonemergent ENT referral. Electronically Signed   By: Valetta Mole M.D.   On: 01/21/2022 15:47   DG CHEST PORT 1 VIEW  Result Date: 01/21/2022 CLINICAL DATA:  81 year old female with a history of hypothermia EXAM: PORTABLE CHEST 1 VIEW COMPARISON:  12/31/2021 FINDINGS: Cardiomediastinal silhouette unchanged in size and contour. No evidence of central vascular congestion. No interlobular septal thickening. No pneumothorax or pleural effusion. Coarsened interstitial markings, with no confluent airspace disease. No acute displaced fracture. IMPRESSION: Negative for acute cardiopulmonary disease Electronically Signed   By: Corrie Mckusick D.O.   On: 01/21/2022 13:23   CT ANGIO HEAD NECK W WO CM (CODE STROKE)  Result Date: 01/21/2022 CLINICAL DATA:  Patient presented with left-sided weakness and then developed aphasia. History of left MCA stroke. EXAM: CT ANGIOGRAPHY HEAD AND NECK TECHNIQUE: Multidetector CT imaging of the head and neck was performed using the standard protocol during bolus administration of intravenous contrast. Multiplanar CT image reconstructions and MIPs were obtained to evaluate the vascular anatomy. Carotid stenosis measurements (when applicable) are obtained utilizing NASCET criteria, using the distal internal carotid diameter as the denominator. RADIATION DOSE REDUCTION: This exam was performed according to the departmental dose-optimization program which includes automated exposure control, adjustment of the mA and/or kV according to patient size and/or use of iterative reconstruction technique. CONTRAST:  39m OMNIPAQUE IOHEXOL 350 MG/ML SOLN  COMPARISON:  CT head 01/21/2022 FINDINGS: CTA NECK FINDINGS Aortic arch: Mild atherosclerotic calcification aortic arch and proximal great vessels. Proximal great vessels widely patent Right carotid system: Mild atherosclerotic calcification right carotid bifurcation without stenosis. Left carotid system: Atherosclerotic calcification left carotid bifurcation and proximal left internal carotid artery. 25% diameter stenosis proximal left internal carotid artery due to calcific plaque. Vertebral arteries: Both vertebral arteries widely patent and normal Skeleton: Cervical spondylosis without acute skeletal abnormality. Other neck: 10 mm right thyroid nodule with peripheral calcification. No further imaging recommended. (Ref: J Am Coll Radiol. 2015 Feb;12(2): 143-50). Upper chest: Lung apices clear bilaterally. Review of the MIP images confirms the above findings CTA HEAD FINDINGS Anterior circulation: Mild atherosclerotic calcification in the  cavernous carotid bilaterally without stenosis. Anterior and middle cerebral arteries patent without large vessel occlusion. Mild stenosis inferior division left middle cerebral artery. Right MCA widely patent. Anterior cerebral arteries widely patent bilaterally. Posterior circulation: Both vertebral arteries patent to the basilar without stenosis. PICA patent bilaterally. Basilar widely patent. Superior cerebellar and posterior cerebral arteries patent bilaterally without stenosis. Fetal origin left posterior cerebral artery. Venous sinuses: Normal venous enhancement Anatomic variants: None Review of the MIP images confirms the above findings IMPRESSION: 1. Negative for intracranial large vessel occlusion 2. Mild atherosclerotic disease in the carotid bifurcation without flow limiting stenosis. 25% diameter stenosis proximal left internal carotid artery. 3. These results were called by telephone at the time of interpretation on 01/21/2022 at 11:53 am to provider [facility]  , who verbally acknowledged these results. Electronically Signed   By: Franchot Gallo M.D.   On: 01/21/2022 11:54   CT HEAD CODE STROKE WO CONTRAST  Result Date: 01/21/2022 CLINICAL DATA:  Code stroke. Acute neuro deficit. Left-sided weakness and dysarthria. EXAM: CT HEAD WITHOUT CONTRAST TECHNIQUE: Contiguous axial images were obtained from the base of the skull through the vertex without intravenous contrast. RADIATION DOSE REDUCTION: This exam was performed according to the departmental dose-optimization program which includes automated exposure control, adjustment of the mA and/or kV according to patient size and/or use of iterative reconstruction technique. COMPARISON:  CT head 12/13/2021 FINDINGS: Brain: Negative for acute infarct, hemorrhage, mass. Chronic left MCA infarct unchanged. No right-sided infarct identified. Vascular: Negative for hyperdense vessel Skull: Negative Sinuses/Orbits: Paranasal sinuses clear.  Negative orbit Other: None ASPECTS ([place] Stroke Program Early CT Score) - Ganglionic level infarction (caudate, lentiform nuclei, internal capsule, insula, M1-M3 cortex): 7 - Supraganglionic infarction (M4-M6 cortex): 3 Total score (0-10 with 10 being normal): 10 IMPRESSION: 1. Negative for acute abnormality. Chronic left MCA infarct unchanged 2. ASPECTS is 10 3. Code stroke imaging results were communicated on 01/21/2022 at 11:34 am to provider Lorrin Goodell via text page Electronically Signed   By: Franchot Gallo M.D.   On: 01/21/2022 11:34    Family Communication: None at bedside  Disposition: Status is: Inpatient Remains inpatient appropriate because: Continued acute encephalopathy due to UTI Planned Discharge Destination: Home with [place], MD 01/22/2022 4:53 PM Page by [email or web address]

## 2022-01-22 NOTE — Evaluation (Signed)
Occupational Therapy Evaluation Patient Details Name: Valerie Mcclure MRN: [id number] DOB: [birth year] Today's Date: 01/22/2022   History of Present Illness 81 y/o female presents to [facility] 01/21/2022 with speech deficits and weakness. PT with chronic R weakness and baseline aphasia from prior CVA. Brain MRI negative. PMH includes L MCA CVA, HTN, osteopenia, afib, sacral wound., memory deficits, anemia   Clinical Impression   PTA patient reports living with her daughter who assists with ADLs as needed, provides supervision for limited mobility with RW. She was admitted for above and is limited by problem list below, including impaired balance, decreased activity tolerance, generalized weakness, and impaired cognition. Noted hx of R UE hemiparesis and aphasia from prior CVA, but demonstrates disorientation, poor problem solving and awareness today. She currently requires min assist for transfers using RW, min to mod assist for functional mobility using RW (due to posterior lean and impaired balance, mgmt with RW), setup to max assist for ADLs.  She will benefit from continued OT services while admitted and after dc at [facility] level to optimize safety and independence with ADLs.  Will follow.      Recommendations for follow up therapy are one component of a multi-disciplinary discharge planning process, led by the attending physician.  Recommendations may be updated based on patient status, additional functional criteria and insurance authorization.   Follow Up Recommendations  Home health OT    Assistance Recommended at Discharge Frequent or constant Supervision/Assistance  Patient can return home with the following A little help with walking and/or transfers;A lot of help with bathing/dressing/bathroom;Assistance with cooking/housework;Direct supervision/assist for financial management;Assist for transportation;Help with stairs or ramp for entrance;Direct supervision/assist for medications  management    Functional Status Assessment  Patient has had a recent decline in their functional status and demonstrates the ability to make significant improvements in function in a reasonable and predictable amount of time.  Equipment Recommendations  Tub/shower bench    Recommendations for Other Services       Precautions / Restrictions Precautions Precautions: Fall Restrictions Weight Bearing Restrictions: No      Mobility Bed Mobility Overal bed mobility: Needs Assistance Bed Mobility: Supine to Sit, Sit to Supine     Supine to sit: Min assist Sit to supine: Min assist        Transfers Overall transfer level: Needs assistance Equipment used: Rolling walker (2 wheels) Transfers: Sit to/from Stand Sit to Stand: Min assist                  Balance Overall balance assessment: Needs assistance Sitting-balance support: No upper extremity supported, Feet supported Sitting balance-Leahy Scale: Poor Sitting balance - Comments: cueing for forward lean, min guard for safety due to posterior lean tendency Postural control: Posterior lean Standing balance support: Bilateral upper extremity supported, During functional activity Standing balance-Leahy Scale: Poor Standing balance comment: relies on BUE and external support                           ADL either performed or assessed with clinical judgement   ADL Overall ADL's : Needs assistance/impaired     Grooming: Wash/dry hands;Set up;Sitting           Upper Body Dressing : Minimal assistance;Sitting   Lower Body Dressing: Maximal assistance;Sit to/from stand   Toilet Transfer: Minimal assistance;Ambulation;Rolling walker (2 wheels);BSC/3in1   Toileting- Clothing Manipulation and Hygiene: Maximal assistance;Sitting/lateral lean;Sit to/from stand       Functional mobility  during ADLs: Minimal assistance;Rolling walker (2 wheels);Moderate assistance General ADL Comments: pt limited by  impaired balance, decreased activity toelrance and cognition     Vision   Vision Assessment?: No apparent visual deficits     Perception     Praxis      Pertinent Vitals/Pain Pain Assessment Pain Assessment: No/denies pain     Hand Dominance Right (affected by prior CVA)   Extremity/Trunk Assessment Upper Extremity Assessment Upper Extremity Assessment: RUE deficits/detail;Generalized weakness RUE Deficits / Details: deficits from prior CVA LUE Deficits / Details: grossly 4/5   Lower Extremity Assessment Lower Extremity Assessment: Defer to PT evaluation   Cervical / Trunk Assessment Cervical / Trunk Assessment: Kyphotic   Communication Communication Communication: Expressive difficulties (at baseline, possible exacerbated?)   Cognition Arousal/Alertness: Awake/alert Behavior During Therapy: WFL for tasks assessed/performed Overall Cognitive Status: History of cognitive impairments - at baseline Area of Impairment: Orientation, Attention, Memory, Following commands, Safety/judgement, Awareness, Problem solving                 Orientation Level: Disoriented to, Place, Time, Situation Current Attention Level: Sustained Memory: Decreased recall of precautions, Decreased short-term memory Following Commands: Follows one step commands consistently, Follows one step commands with increased time Safety/Judgement: Decreased awareness of safety, Decreased awareness of deficits Awareness: Intellectual Problem Solving: Slow processing, Requires verbal cues, Difficulty sequencing General Comments: pt with hx of prior CVA with cognitive deficits and expressive aphasia.  She demonstrates poor awareness to deficits, safety and reports she is at home.     General Comments  VSS    Exercises     Shoulder Instructions      Home Living Family/patient expects to be discharged to:: Private residence Living Arrangements: Children Available Help at Discharge: Family;Available  24 hours/day Type of Home: Mobile home Home Access: Ramped entrance     Home Layout: One level     Bathroom Shower/Tub: Hospital doctor Toilet: Handicapped height     Home Equipment: Conservation officer, nature (2 wheels);Wheelchair - manual;Cane - single point;BSC/3in1   Additional Comments: per PT eval      Prior Functioning/Environment Prior Level of Function : Needs assist;Patient poor historian/Family not available             Mobility Comments: pt reports she is able to ambulate with use of walker and some assistance from daughter for short distances, but mainly utilizes wheelchair for household mobility ADLs Comments: pt reports bathing herself, requires assistance for lower body dressing, assist for IADLs        OT Problem List: Decreased strength;Decreased activity tolerance;Impaired balance (sitting and/or standing);Decreased cognition;Decreased safety awareness;Decreased knowledge of use of DME or AE;Decreased knowledge of precautions;Cardiopulmonary status limiting activity;Decreased coordination;Impaired tone;Impaired UE functional use;Impaired sensation      OT Treatment/Interventions: Self-care/ADL training;Therapeutic exercise;DME and/or AE instruction;Cognitive remediation/compensation;Therapeutic activities;Patient/family education;Balance training;Neuromuscular education    OT Goals(Current goals can be found in the care plan section) Acute Rehab OT Goals Patient Stated Goal: none stated OT Goal Formulation: Patient unable to participate in goal setting Time For Goal Achievement: 02/05/22 Potential to Achieve Goals: Good  OT Frequency: Min 2X/week    Co-evaluation              AM-PAC OT "6 Clicks" Daily Activity     Outcome Measure Help from another person eating meals?: A Little Help from another person taking care of personal grooming?: A Little Help from another person toileting, which includes using toliet, bedpan, or urinal?: Total Help  from another person bathing (including washing, rinsing, drying)?: Total Help from another person to put on and taking off regular upper body clothing?: A Little Help from another person to put on and taking off regular lower body clothing?: A Lot 6 Click Score: 13   End of Session Equipment Utilized During Treatment: Gait belt;Rolling walker (2 wheels) Nurse Communication: Mobility status  Activity Tolerance: Patient tolerated treatment well Patient left: in bed;with call bell/phone within reach;with bed alarm set  OT Visit Diagnosis: Other abnormalities of gait and mobility (R26.89);Muscle weakness (generalized) (M62.81);Other symptoms and signs involving cognitive function;Hemiplegia and hemiparesis Hemiplegia - Right/Left: Right Hemiplegia - dominant/non-dominant: Dominant Hemiplegia - caused by: Cerebral infarction (chronic)                Time: 0379-4446 OT Time Calculation (min): 20 min Charges:  OT General Charges $OT Visit: 1 Visit OT Evaluation $OT Eval Moderate Complexity: 1 Mod  Jolaine Artist, OT Acute Rehabilitation Services Office [phone]   Delight Stare 01/22/2022, 10:23 AM

## 2022-01-22 NOTE — Consult Note (Addendum)
[facility] Nurse Consult Note: Reason for Consult: Consult requested for sacrum. Pt has red moist macerated skin surrounding to bilat buttocks, it is blanchable appearance is consistent with moisture associated skin damage.  ICD-10 CM Codes for Irritant Dermatitis L24A2 - Due to fecal, urinary or dual incontinence  Wound type: Sacrum with crevice of skin over the sacrum; Stage 2 pressure injury .2X.2X.1cm, pink and moist.  Pressure Injury POA: Yes Dressing procedure/placement/frequency: Topical treatment orders provided for bedside nurses to perform to protect and promote healing: Foam dressing to buttocks/sacrum, change Q 3 days or PRN soiling. Please re-consult if further assistance is needed.  Thank-you,  Julien Girt MSN, [place], [place], [place], [place]

## 2022-01-22 NOTE — Care Management Obs Status (Signed)
[facility] NOTIFICATION   Patient Details  Name: THANDIWE SIRAGUSA MRN: [id number] Date of Birth: 05/27/1941   Medicare Observation Status Notification Given:  Yes    Pollie Friar, RN 01/22/2022, 10:33 AM
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
## 2022-01-22 NOTE — TOC Initial Note (Signed)
Transition of Care [facility]) - Initial/Assessment Note    Patient Details  Name: Valerie Mcclure MRN: [id number] Date of Birth: Mar 05, 1941  Transition of Care [facility]) CM/SW Contact:    Pollie Friar, RN Phone Number: 01/22/2022, 1:10 PM  Clinical Narrative:                 Patient is from home with her daughter. CM met with the patient and due to some confusion CM called per daughter, Valerie Mcclure. Valerie Mcclure states she assist the patient with her ADL's and prepares her meals. Melanie manages the patients medications. Pts son does the needed transportation.  Recommendations for 3 in 1. Melanie agreeable. Will have DME delivered to the room prior to d/c.  Valerie Mcclure states the patient is active with [facility] for home health. Cm verified this with Jenny Reichmann from [place]. She is active with PT/OT/RN. These services will be resumed at d/c.  TOC following.  Expected Discharge Plan: [place] Barriers to Discharge: Continued Medical Work up   Patient Goals and CMS Choice   CMS Medicare.gov Compare Post Acute Care list provided to:: Patient Represenative (must comment) Choice offered to / list presented to : Adult Children  Expected Discharge Plan and Services Expected Discharge Plan: [place]   Discharge Planning Services: CM Consult Post Acute Care Choice: Home Health Living arrangements for the past 2 months: Single Family Home                 DME Arranged: 3-N-1 DME Agency: [employer]       HH Arranged: PT, OT, RN HH Agency: [place] Date [facility] Agency Contacted: 01/22/22   Representative spoke with at [place]: [place] Arrangements/Services Living arrangements for the past 2 months: [place] Lives with:: Adult Children (daughter) Patient language and need for interpreter reviewed:: Yes Do you feel safe going back to the place where you live?: Yes      Need for Family Participation in Patient Care: Yes (Comment) Care  giver support system in place?: Yes (comment) (daughter) Current home services: DME (walker/ wheelchair) Criminal Activity/Legal Involvement Pertinent to Current Situation/Hospitalization: No - Comment as needed  Activities of Daily Living      Permission Sought/Granted                  Emotional Assessment Appearance:: Appears stated age Attitude/Demeanor/Rapport: Engaged Affect (typically observed): Restless Orientation: : Oriented to Self, Oriented to Place   Psych Involvement: No (comment)  Admission diagnosis:  Stroke-like symptoms [X32.44] Acute metabolic encephalopathy [W10.27] Patient Active Problem List   Diagnosis Date Noted   Acute metabolic encephalopathy 25/36/6440   Stroke-like symptoms 01/21/2022   Hypothermia 01/21/2022   UTI (urinary tract infection) 01/08/2022   Syncope 01/02/2022   General weakness    Skin ulcer of sacrum ([name])    Metabolic acidosis 34/74/2595   Normocytic anemia 12/11/2021   Thrombocytopenia ([place]) 12/11/2021   Memory deficit 12/11/2021   Syncope and collapse 12/09/2021   Stroke-like symptom 12/09/2021   SIRS (systemic inflammatory response syndrome) ([place]) 12/09/2021   Orthostatic hypotension 12/09/2021   Leg ulcer, left ([place]) 10/13/2021   Eye tearing, left 02/19/2020   TMJ syndrome, left 02/19/2020   Lump in neck 06/08/2018   Sleep difficulties 03/31/2018   Persistent atrial fibrillation ([place]) 09/06/2017   TIA (transient ischemic attack) 07/12/2017   Bradycardia 04/12/2017   Depression 01/29/2017   Fall    Aphasia as late effect  of cerebrovascular accident (CVA)    Chronic diastolic heart failure ([name])    Dysarthria    Atrial flutter ([place]) 12/22/2016   Spastic hemiplegia of right dominant side as late effect of cerebral infarction ([place])    Anemia of chronic disease    Hypertension    Dysphagia, post-stroke    Anterior cerebral circulation hemorrhagic infarction ([place]) 12/03/2016   Right hemiparesis ([place])     Nontraumatic subcortical hemorrhage of left cerebral hemisphere ([name])    Mild aortic regurgitation 08/20/2016   Bilateral edema of lower extremity 04/07/2016   Pancreatic duct dilated 01/09/2016   Stage 3b chronic kidney disease (CKD) ([place]) 12/25/2015   Mild tricuspid regurgitation 12/25/2015   Mild mitral regurgitation 12/25/2015   Diabetes mellitus without complication ([place]) 73/22/0254   Abdominal pain, chronic, right upper quadrant 06/04/2015   Chronic low back pain    Pain in thoracic spine 04/18/2010   Coronary atherosclerosis 01/24/2009   ARTHRITIS, LEFT KNEE 12/13/2008   Hypothyroidism 11/21/2007   Hyperlipidemia 11/09/2007   Osteopenia 11/09/2007   PCP:  [name], MD Pharmacy:   [facility] [place], [place] AT [place] [address] Phone: [phone] Fax: [phone]     Social Determinants of Health (SDOH) Interventions    Readmission Risk Interventions     No data to display

## 2022-01-23 DIAGNOSIS — R2990 Unspecified symptoms and signs involving the nervous system: Secondary | ICD-10-CM | POA: Diagnosis not present

## 2022-01-23 DIAGNOSIS — I5032 Chronic diastolic (congestive) heart failure: Secondary | ICD-10-CM | POA: Diagnosis not present

## 2022-01-23 DIAGNOSIS — G9341 Metabolic encephalopathy: Secondary | ICD-10-CM | POA: Diagnosis not present

## 2022-01-23 DIAGNOSIS — D638 Anemia in other chronic diseases classified elsewhere: Secondary | ICD-10-CM | POA: Diagnosis not present

## 2022-01-23 LAB — GLUCOSE, CAPILLARY
Glucose-Capillary: 110 mg/dL — ABNORMAL HIGH (ref 70–99)
Glucose-Capillary: 109 mg/dL — ABNORMAL HIGH (ref 70–99)
Glucose-Capillary: 106 mg/dL — ABNORMAL HIGH (ref 70–99)
Glucose-Capillary: 98 mg/dL (ref 70–99)

## 2022-01-23 LAB — URINE CULTURE: Culture: >=100000 — AB

## 2022-01-23 LAB — CORTISOL-AM, BLOOD: Cortisol - AM: 11.2 ug/dL (ref 6.7–22.6)

## 2022-01-23 LAB — T4, FREE: Free T4: 1.35 ng/dL — ABNORMAL HIGH (ref 0.61–1.12)

## 2022-01-23 MED ORDER — OLANZAPINE 2.5 MG PO TABS
7.5000 mg | ORAL_TABLET | Freq: Every day | ORAL | Status: DC
Start: 2022-01-23 — End: 2022-01-24
  Filled 2022-01-23: qty 3

## 2022-01-23 MED ORDER — HALOPERIDOL LACTATE 5 MG/ML IJ SOLN
1.0000 mg | Freq: Once | INTRAMUSCULAR | Status: AC | PRN
Start: 2022-01-23 — End: 2022-01-23
  Administered 2022-01-23: 1 mg via INTRAVENOUS
  Filled 2022-01-23: qty 1

## 2022-01-23 MED ORDER — TRAZODONE HCL 100 MG PO TABS
100.0000 mg | ORAL_TABLET | Freq: Every day | ORAL | Status: DC
  Filled 2022-01-23: qty 1

## 2022-01-23 MED ORDER — SULFAMETHOXAZOLE-TRIMETHOPRIM 400-80 MG PO TABS
1.0000 | ORAL_TABLET | Freq: Two times a day (BID) | ORAL | Status: DC
  Administered 2022-01-23: 1 via ORAL
  Filled 2022-01-23 (×3): qty 1

## 2022-01-23 MED ORDER — HALOPERIDOL LACTATE 5 MG/ML IJ SOLN: 1.0000 mg | Freq: Four times a day (QID) | INTRAMUSCULAR | Status: DC | PRN

## 2022-01-23 MED ORDER — TORSEMIDE 20 MG PO TABS
10.0000 mg | ORAL_TABLET | Freq: Every day | ORAL | Status: DC
  Administered 2022-01-24 – 2022-01-26 (×3): 10 mg via ORAL
  Filled 2022-01-23 (×4): qty 1

## 2022-01-23 MED ORDER — SODIUM CHLORIDE 0.9 % IV SOLN
2.0000 g | INTRAVENOUS | Status: DC
  Administered 2022-01-23: 2 g via INTRAVENOUS
  Filled 2022-01-23: qty 12.5

## 2022-01-23 NOTE — Progress Notes (Signed)
Progress Note  Patient: Valerie Mcclure WPY:[id number] DOB: [birth year]  DOA: 01/21/2022  DOS: 01/23/2022    Brief hospital course: LUDEAN DUHART is an 81 y.o. female with a history of CVA, residual right-sided weakness, dysarthria, HTN, HLD, chronic HFpEF, T2DM, stage IIIb CKD, atrial fibrillation who presented to the ED 7/5 with acute worsening of aphasia that day and dysuria beginning the prior evening at home.    CT head without acute abnormality, no LVO on CTA head/neck, and MR brain showed no acute intracranial pathology, unchanged remote left MCA distribution infarct.   She was hypothermic, and remained altered, found to have pyuria and bacteriuria for which ceftriaxone was started and urine culture sent.    Assessment and Plan: Stroke-like symptoms: Has had repeatedly negative neuroimaging, including this admission. Had recent echo, so not repeating at this time.  - Continue eliquis, statin. D/w Dr. [name].      Sepsis due to Citrobacter UTI with acute metabolic encephalopathy on chronic memory deficit: With hypothermia, hypotension. Note very similar presentation in May 2023. - Change ceftriaxone due to resistance to bactrim per discussion with ID, Dr. Candiss Norse. - Blood cultures NGTD.  History of left MCA CVA with residual right-sided hemiplegia, dysarthria: At baseline functionally.  - Plan to resume HH PT, OT, RN at discharge based on reassessments this admission.   Persistent atrial fibrillation: Currently in NSR. - Continue metoprolol - Continue eliquis   Chronic HFpEF: Recent echo 5/23: showed EF 65-70% with indeterminate diastolic function and normal RV function. Moderate aortic regurgitation. Has chronic LE edema which is at approximate baseline based on my prior assessments.  - No changes to medications planned at this time.  - Restart demadex '10mg'$  qAM.    Acute metabolic encephalopathy, depression: Worsened by husband's passing earlier this year.  - Continue SSRI,  nortriptyline qHS. Added trazodone qHS to assist with sleep-wake cycles, increase dose tonight. No evidence of serotonin syndrome. - Will reorder the prn haldol, though hoping to avoid its use.   Well-controlled NIDT2DM: HbA1c 5.8%. - Continue SSI   Hypertension - Holding home medication initially, will restart with BP 150/113.   Hyperlipidemia: LDL 35.  - Continue lipitor.    Hypothyroidism: TSH checked 6/14 and elevated, remains elevated in 5.1-5.9 range, though free T4 noted to be 1.35 not consistent with subtherapeutic synthroid.  - Continue synthroid at current dose.    Stage 3b CKD: Near baseline.  - Avoid nephrotoxins.   Anemia of chronic disease: Hgb 9.2 stable - Monitor intermittently.  Stage 2 right sacral pressure injury, POA:  - Offload, optimize mobility and nutritional status.    Subjective: Had severe delirium last night, restlessness did not sleep, only calmed after trazodone, zyprexa and haldol. Still worse off mentally this morning than yesterday. Afebrile.  Objective: Vitals:   01/22/22 2200 01/23/22 0600 01/23/22 0900 01/23/22 1221  BP: (!) 149/91 (!) 155/99 (!) 153/98 (!) 145/58  Pulse: 88 95 96   Resp:   20   Temp: 98.1 F (36.7 C) 98.2 F (36.8 C) 98.3 F (36.8 C)   TempSrc: Oral Axillary Axillary   SpO2: 99% 99%     Gen: Delirious elderly female in no distress Pulm: Nonlabored breathing room air. Clear. CV: Regular rate and rhythm. No murmur, rub, or gallop. No JVD, stable dependent edema. GI: Abdomen soft, +mild suprapubic tenderness, no CVA ttp, non-distended, with normoactive bowel sounds.  Ext: Warm, no deformities Skin: No new rashes, lesions or ulcers on visualized skin. Neuro: Alert, not  oriented or cooperative at this time.  Psych: Restless  Data Personally reviewed: CBC: Recent Labs  Lab 01/21/22 1115 01/21/22 1119 01/22/22 0044  WBC 5.2  --  4.2  NEUTROABS 3.2  --   --   HGB 9.2* 9.5* 9.1*  HCT 28.6* 28.0* 29.1*  MCV 95.0   --  94.2  PLT 218  --  735   Basic Metabolic Panel: Recent Labs  Lab 01/21/22 1115 01/21/22 1119 01/22/22 0044  NA 138 135 138  K 3.6 3.5 4.2  CL 102 101 103  CO2 24  --  23  GLUCOSE 118* 117* 136*  BUN '19 19 17  '$ CREATININE 1.48* 1.50* 1.43*  CALCIUM 9.3  --  9.0   GFR: Estimated Creatinine Clearance: 31.5 mL/min (A) (by C-G formula based on SCr of 1.43 mg/dL (H)). Liver Function Tests: Recent Labs  Lab 01/21/22 1115  AST 21  ALT 14  ALKPHOS 71  BILITOT 0.5  PROT 5.8*  ALBUMIN 2.9*   No results for input(s): "LIPASE", "AMYLASE" in the last 168 hours. No results for input(s): "AMMONIA" in the last 168 hours. Coagulation Profile: Recent Labs  Lab 01/21/22 1115  INR 1.3*   Cardiac Enzymes: No results for input(s): "CKTOTAL", "CKMB", "CKMBINDEX", "TROPONINI" in the last 168 hours. BNP (last 3 results) No results for input(s): "PROBNP" in the last 8760 hours. HbA1C: Recent Labs    01/22/22 0044  HGBA1C 5.8*   CBG: Recent Labs  Lab 01/22/22 1140 01/22/22 1607 01/22/22 2045 01/23/22 0920 01/23/22 1156  GLUCAP 99 157* 94 110* 109*   Lipid Profile: Recent Labs    01/22/22 0044  CHOL 94  HDL 46  LDLCALC 35  TRIG 66  CHOLHDL 2.0   Thyroid Function Tests: Recent Labs    01/21/22 1115 01/21/22 1452  TSH  --  5.974*  FREET4 1.35*  --    Anemia Panel: No results for input(s): "VITAMINB12", "FOLATE", "FERRITIN", "TIBC", "IRON", "RETICCTPCT" in the last 72 hours. Urine analysis:    Component Value Date/Time   COLORURINE YELLOW 01/21/2022 1500   APPEARANCEUR HAZY (A) 01/21/2022 1500   LABSPEC 1.013 01/21/2022 1500   PHURINE 5.0 01/21/2022 1500   GLUCOSEU NEGATIVE 01/21/2022 1500   GLUCOSEU NEGATIVE 11/18/2010 0859   HGBUR SMALL (A) 01/21/2022 1500   HGBUR negative 12/13/2008 0944   BILIRUBINUR NEGATIVE 01/21/2022 1500   KETONESUR NEGATIVE 01/21/2022 1500   PROTEINUR NEGATIVE 01/21/2022 1500   UROBILINOGEN 0.2 11/18/2010 0859   NITRITE  POSITIVE (A) 01/21/2022 1500   LEUKOCYTESUR LARGE (A) 01/21/2022 1500   Recent Results (from the past 240 hour(s))  Resp Panel by RT-PCR (Flu A&B, Covid) Anterior Nasal Swab     Status: None   Collection Time: 01/21/22 11:18 AM   Specimen: Anterior Nasal Swab  Result Value Ref Range Status   SARS Coronavirus 2 by RT PCR NEGATIVE NEGATIVE Final    Comment: (NOTE) SARS-CoV-2 target nucleic acids are NOT DETECTED.  The SARS-CoV-2 RNA is generally detectable in upper respiratory specimens during the acute phase of infection. The lowest concentration of SARS-CoV-2 viral copies this assay can detect is 138 copies/mL. A negative result does not preclude SARS-Cov-2 infection and should not be used as the sole basis for treatment or other patient management decisions. A negative result may occur with  improper specimen collection/handling, submission of specimen other than nasopharyngeal swab, presence of viral mutation(s) within the areas targeted by this assay, and inadequate number of viral copies(<138 copies/mL). A negative result must  be combined with clinical observations, patient history, and epidemiological information. The expected result is Negative.  Fact Sheet for Patients:  EntrepreneurPulse.com.au  Fact Sheet for Healthcare Providers:  IncredibleEmployment.be  This test is no t yet approved or cleared by the [place] FDA and  has been authorized for detection and/or diagnosis of SARS-CoV-2 by FDA under an Emergency Use Authorization (EUA). This EUA will remain  in effect (meaning this test can be used) for the duration of the COVID-19 declaration under Section 564(b)(1) of the Act, 21 U.S.C.section 360bbb-3(b)(1), unless the authorization is terminated  or revoked sooner.       Influenza A by PCR NEGATIVE NEGATIVE Final   Influenza B by PCR NEGATIVE NEGATIVE Final    Comment: (NOTE) The Xpert Xpress SARS-CoV-2/FLU/RSV plus assay  is intended as an aid in the diagnosis of influenza from Nasopharyngeal swab specimens and should not be used as a sole basis for treatment. Nasal washings and aspirates are unacceptable for Xpert Xpress SARS-CoV-2/FLU/RSV testing.  Fact Sheet for Patients: EntrepreneurPulse.com.au  Fact Sheet for Healthcare Providers: IncredibleEmployment.be  This test is not yet approved or cleared by the [place] FDA and has been authorized for detection and/or diagnosis of SARS-CoV-2 by FDA under an Emergency Use Authorization (EUA). This EUA will remain in effect (meaning this test can be used) for the duration of the COVID-19 declaration under Section 564(b)(1) of the Act, 21 U.S.C. section 360bbb-3(b)(1), unless the authorization is terminated or revoked.  Performed at [facility], [place] [address], [place], [place] [address]   Urine Culture     Status: Abnormal   Collection Time: 01/21/22  2:14 PM   Specimen: Urine, Clean Catch  Result Value Ref Range Status   Specimen Description URINE, CLEAN CATCH  Final   Special Requests   Final    NONE Performed at [facility], [place] [address], [place], [place] [address]    Culture >=100,000 COLONIES/mL CITROBACTER AMALONATICUS (A)  Final   Report Status 01/23/2022 FINAL  Final   Organism ID, Bacteria CITROBACTER AMALONATICUS (A)  Final      Susceptibility   Citrobacter amalonaticus - MIC*    CEFAZOLIN >=64 RESISTANT Resistant     CEFEPIME <=0.12 SENSITIVE Sensitive     CEFTRIAXONE 32 RESISTANT Resistant     CIPROFLOXACIN <=0.25 SENSITIVE Sensitive     GENTAMICIN <=1 SENSITIVE Sensitive     IMIPENEM <=0.25 SENSITIVE Sensitive     NITROFURANTOIN 32 SENSITIVE Sensitive     TRIMETH/SULFA <=20 SENSITIVE Sensitive     PIP/TAZO <=4 SENSITIVE Sensitive     * >=100,000 COLONIES/mL CITROBACTER AMALONATICUS  Culture, blood (Routine X 2) w Reflex to ID Panel     Status: None (Preliminary result)    Collection Time: 01/21/22  2:27 PM   Specimen: BLOOD  Result Value Ref Range Status   Specimen Description BLOOD RIGHT ANTECUBITAL  Final   Special Requests   Final    BOTTLES DRAWN AEROBIC AND ANAEROBIC Blood Culture adequate volume   Culture   Final    NO GROWTH 2 DAYS Performed at [facility], [address] [place],  [address]    Report Status PENDING  Incomplete  Culture, blood (Routine X 2) w Reflex to ID Panel     Status: None (Preliminary result)   Collection Time: 01/21/22  7:56 PM   Specimen: BLOOD  Result Value Ref Range Status   Specimen Description BLOOD RIGHT ANTECUBITAL  Final   Special  Requests   Final    BOTTLES DRAWN AEROBIC AND ANAEROBIC Blood Culture adequate volume   Culture   Final    NO GROWTH 2 DAYS Performed at [facility], [place] [address], [place], [place] [address]    Report Status PENDING  Incomplete     No results found.  Family Communication: Daughter at bedside  Disposition: Status is: Inpatient Remains inpatient appropriate because: Continued acute encephalopathy due to UTI that has not been adequately treated due to drug resistance until today. Planned Discharge Destination: Home with [place], MD 01/23/2022 3:35 PM Page by [email or web address]

## 2022-01-23 NOTE — Progress Notes (Signed)
Pharmacy Antibiotic Note  Valerie Mcclure is a 81 y.o. female admitted on 01/21/2022 with stroke-like sx and SIRS criteria, had been started on Rocephin for UTI, changed to PO Bactrim today for Cx result of Citrobacter resistant to ceftriaxone; MD reports that pt is too somnolent to tolerate PO meds.  Pharmacy has been consulted for cefepime dosing.  Plan: Cefepime 2g IV Q24H.  Temp (24hrs), Avg:98.4 F (36.9 C), Min:98.2 F (36.8 C), Max:98.6 F (37 C)  Recent Labs  Lab 01/21/22 1115 01/21/22 1119 01/21/22 1452 01/21/22 1956 01/22/22 0044  WBC 5.2  --   --   --  4.2  CREATININE 1.48* 1.50*  --   --  1.43*  LATICACIDVEN  --   --  0.8 0.9  --     Estimated Creatinine Clearance: 31.5 mL/min (A) (by C-G formula based on SCr of 1.43 mg/dL (H)).    Allergies  Allergen Reactions   Clonazepam Other (See Comments)    AFFECTED MOOD NEGATIVELY- Do NOT give   Shrimp [Shellfish Allergy] Anaphylaxis, Swelling, Rash and Other (See Comments)    Break outs and swelling of the throat   Valsartan Other (See Comments)    Renal failure   Tandem Plus [Fefum-Fepo-Fa-B Cmp-C-Zn-Mn-Cu] Nausea And Vomiting   Ivp Dye [Iodinated Contrast Media] Itching and Rash   Penicillins Itching and Rash    Has patient had a PCN reaction causing immediate rash, facial/tongue/throat swelling, SOB or lightheadedness with hypotension: Yes Has patient had a PCN reaction causing severe rash involving mucus membranes or skin necrosis: No Has patient had a PCN reaction that required hospitalization: No Has patient had a PCN reaction occurring within the last 10 years: No If all of the above answers are "NO", then may proceed with Cephalosporin use.     Thank you for allowing pharmacy to be a part of this patient's care.  Wynona Neat, PharmD, BCPS  01/23/2022 11:06 PM

## 2022-01-23 NOTE — Progress Notes (Signed)
SLP Cancellation Note  Patient Details Name: SAAVI MCEACHRON MRN: [id number] DOB: 1941-04-19   Cancelled treatment:       Reason Eval/Treat Not Completed: Patient at procedure or test/unavailable (Pt receiving care from multiple RNs when SLP attempted. SLP will follow up on subsequent date.)  Pranay Hilbun I. Hardin Negus, [place], [place] Office number [phone] Pager [place] 01/23/2022, 1:11 PM

## 2022-01-23 NOTE — Progress Notes (Signed)
Pt requires a 3 in 1 as she has difficulty ambulating to the bathroom in a timely manner. She needs the use of the 3 in 1 by her bed.

## 2022-01-23 NOTE — Progress Notes (Signed)
PT Cancellation Note  Patient Details Name: Valerie Mcclure MRN: [id number] DOB: 22-Jan-1941   Cancelled Treatment:    Reason Eval/Treat Not Completed: (P) Other (comment) (Pt with rapid response this am, RN reports she has not slept in 48hours and is finally sleeping soundly.  Rn deferred tx at this time.)   Lyndel Sarate Eli Hose 01/23/2022, 4:16 PM  Erasmo Leventhal , PTA Acute Rehabilitation Services Office [phone]

## 2022-01-24 DIAGNOSIS — R2990 Unspecified symptoms and signs involving the nervous system: Secondary | ICD-10-CM | POA: Diagnosis not present

## 2022-01-24 DIAGNOSIS — D638 Anemia in other chronic diseases classified elsewhere: Secondary | ICD-10-CM | POA: Diagnosis not present

## 2022-01-24 DIAGNOSIS — G9341 Metabolic encephalopathy: Secondary | ICD-10-CM | POA: Diagnosis not present

## 2022-01-24 DIAGNOSIS — I5032 Chronic diastolic (congestive) heart failure: Secondary | ICD-10-CM | POA: Diagnosis not present

## 2022-01-24 LAB — COMPREHENSIVE METABOLIC PANEL
ALT: 11 U/L (ref 0–44)
AST: 13 U/L — ABNORMAL LOW (ref 15–41)
Albumin: 2.4 g/dL — ABNORMAL LOW (ref 3.5–5.0)
Alkaline Phosphatase: 58 U/L (ref 38–126)
Anion gap: 13 (ref 5–15)
BUN: 15 mg/dL (ref 8–23)
CO2: 19 mmol/L — ABNORMAL LOW (ref 22–32)
Calcium: 8.5 mg/dL — ABNORMAL LOW (ref 8.9–10.3)
Chloride: 109 mmol/L (ref 98–111)
Creatinine, Ser: 1.53 mg/dL — ABNORMAL HIGH (ref 0.44–1.00)
GFR, Estimated: 34 mL/min — ABNORMAL LOW (ref >60)
Glucose, Bld: 96 mg/dL (ref 70–99)
Potassium: 3.6 mmol/L (ref 3.5–5.1)
Sodium: 141 mmol/L (ref 135–145)
Total Bilirubin: 0.9 mg/dL (ref 0.3–1.2)
Total Protein: 5.1 g/dL — ABNORMAL LOW (ref 6.5–8.1)

## 2022-01-24 LAB — GLUCOSE, CAPILLARY
Glucose-Capillary: 83 mg/dL (ref 70–99)
Glucose-Capillary: 93 mg/dL (ref 70–99)
Glucose-Capillary: 112 mg/dL — ABNORMAL HIGH (ref 70–99)
Glucose-Capillary: 132 mg/dL — ABNORMAL HIGH (ref 70–99)

## 2022-01-24 LAB — T3, FREE: T3, Free: 2.3 pg/mL (ref 2.0–4.4)

## 2022-01-24 LAB — CBC
HCT: 26 % — ABNORMAL LOW (ref 36.0–46.0)
Hemoglobin: 8.3 g/dL — ABNORMAL LOW (ref 12.0–15.0)
MCH: 30.2 pg (ref 26.0–34.0)
MCHC: 31.9 g/dL (ref 30.0–36.0)
MCV: 94.5 fL (ref 80.0–100.0)
Platelets: 184 10*3/uL (ref 150–400)
RBC: 2.75 MIL/uL — ABNORMAL LOW (ref 3.87–5.11)
RDW: 15.4 % (ref 11.5–15.5)
WBC: 6.2 10*3/uL (ref 4.0–10.5)
nRBC: 0 % (ref 0.0–0.2)

## 2022-01-24 MED ORDER — METOPROLOL TARTRATE 25 MG PO TABS: 25.0000 mg | ORAL_TABLET | Freq: Two times a day (BID) | ORAL | Status: DC

## 2022-01-24 MED ORDER — OLANZAPINE 2.5 MG PO TABS
5.0000 mg | ORAL_TABLET | Freq: Every day | ORAL | Status: DC
  Administered 2022-01-24 – 2022-01-28 (×5): 5 mg via ORAL
  Filled 2022-01-24 (×5): qty 2

## 2022-01-24 MED ORDER — CHLORHEXIDINE GLUCONATE CLOTH 2 % EX PADS
6.0000 | MEDICATED_PAD | Freq: Every day | CUTANEOUS | Status: DC
  Administered 2022-01-24 – 2022-01-29 (×6): 6 via TOPICAL

## 2022-01-24 MED ORDER — SULFAMETHOXAZOLE-TRIMETHOPRIM 400-80 MG PO TABS
1.0000 | ORAL_TABLET | Freq: Two times a day (BID) | ORAL | Status: DC
  Administered 2022-01-24 – 2022-01-29 (×10): 1 via ORAL
  Filled 2022-01-24 (×12): qty 1

## 2022-01-24 MED ORDER — TRAZODONE HCL 50 MG PO TABS
50.0000 mg | ORAL_TABLET | Freq: Every day | ORAL | Status: DC
  Administered 2022-01-24 – 2022-01-28 (×5): 50 mg via ORAL
  Filled 2022-01-24 (×5): qty 1

## 2022-01-24 MED ORDER — SULFAMETHOXAZOLE-TRIMETHOPRIM 400-80 MG PO TABS: 1.0000 | ORAL_TABLET | Freq: Two times a day (BID) | ORAL | 0 refills | Status: DC

## 2022-01-24 NOTE — Progress Notes (Signed)
Physical Therapy Treatment Patient Details Name: Valerie Mcclure MRN: [id number] DOB: 01-Apr-1941 Today's Date: 01/24/2022   History of Present Illness 81 y/o female presents to [facility] hospital 01/21/2022 with speech deficits and weakness. PT with chronic R weakness and baseline aphasia from prior CVA. Brain MRI negative. PMH includes L MCA CVA, HTN, osteopenia, afib, sacral wound., memory deficits, anemia    PT Comments    Pt received in bed, awake and alert and following commands with verbal and tactile cues and increased time. Daughter has concerns about maintenance of catheter at home. Pt required min A to come to EOB and increased time to gain balance in sitting EOB due to posterior lean. Pt stood with mod A with posterior and L lean. Pt ambulated 20' with RW and mod A with great difficulty turning and stepping bkwds. Daughter present and assisting with IV pole. Though pt with great improvement since status of past 2 days, do not believe she is safe enough with mobility to go home with daughter today. However, with 1-2 more visits she does have the potential to return to min A level at which point she will be ready to go home with family and resume HHPT. PT will continue to follow.    Recommendations for follow up therapy are one component of a multi-disciplinary discharge planning process, led by the attending physician.  Recommendations may be updated based on patient status, additional functional criteria and insurance authorization.  Follow Up Recommendations  Home health PT     Assistance Recommended at Discharge Frequent or constant Supervision/Assistance  Patient can return home with the following A lot of help with walking and/or transfers;A lot of help with bathing/dressing/bathroom;Assistance with cooking/housework;Direct supervision/assist for medications management;Direct supervision/assist for financial management;Assist for transportation;Help with stairs or ramp for entrance    Equipment Recommendations  BSC/3in1    Recommendations for Other Services       Precautions / Restrictions Precautions Precautions: Fall Precaution Comments: incontinent of urine with mobility (wears briefs at home), R-sided coordination deficits. Restrictions Weight Bearing Restrictions: No     Mobility  Bed Mobility Overal bed mobility: Needs Assistance Bed Mobility: Supine to Sit, Sit to Supine Rolling: Min assist (with use of the rail)   Supine to sit: Min assist Sit to supine: Mod assist   General bed mobility comments: pt cued to get to EOB and worked LE's off EOB and was then stuck. Assisted pt with LE's back into bed and cued her to roll into R SL first. Pt was then able to sit up with min A. Talked through this process with pt and daughter for home. Mod A for LE's back into bed with return to supine    Transfers Overall transfer level: Needs assistance Equipment used: Rolling walker (2 wheels) Transfers: Sit to/from Stand Sit to Stand: Mod assist           General transfer comment: mod A for sit to stand due to heavy posterior lean and difficulty maintaining hold of RW with R hand. Mod A for stand>sit due to uncontrolled descent    Ambulation/Gait Ambulation/Gait assistance: Mod assist, +2 safety/equipment Gait Distance (Feet): 20 Feet Assistive device: Rolling walker (2 wheels) Gait Pattern/deviations: Step-through pattern, Leaning posteriorly, Staggering left Gait velocity: reduced Gait velocity interpretation: <1.31 ft/sec, indicative of household ambulator   General Gait Details: pt with L and posterior lean throughout gait. Daughter pushed IV pole as pt needed mod A from therapist throughout for safety with ambulation. She had great  difficulty with turning and backing to bed. Do not feel that daughter is able to give this level of assist at home but do feel that she will improve markedly with 1-2 more visits given that she has been in bed past few  days   Stairs             Wheelchair Mobility    Modified Rankin (Stroke Patients Only)       Balance Overall balance assessment: Needs assistance Sitting-balance support: No upper extremity supported, Feet supported Sitting balance-Leahy Scale: Fair Sitting balance - Comments: able to maintain balance EOB but posterior lean noted with pertubation Postural control: Posterior lean Standing balance support: Bilateral upper extremity supported, During functional activity Standing balance-Leahy Scale: Poor Standing balance comment: relies on BUE and external support                            Cognition Arousal/Alertness: Awake/alert Behavior During Therapy: WFL for tasks assessed/performed Overall Cognitive Status: History of cognitive impairments - at baseline                                 General Comments: pt following commands with max verbal and tactile cues but needs increased time and is apraxic at baseline        Exercises      General Comments General comments (skin integrity, edema, etc.): VSS on RA      Pertinent Vitals/Pain Pain Assessment Pain Assessment: No/denies pain    Home Living                          Prior Function            PT Goals (current goals can now be found in the care plan section) Acute Rehab PT Goals Patient Stated Goal: to go home PT Goal Formulation: With patient Time For Goal Achievement: 02/04/22 Potential to Achieve Goals: Fair Progress towards PT goals: Progressing toward goals    Frequency    Min 3X/week      PT Plan Current plan remains appropriate    Co-evaluation              AM-PAC PT "6 Clicks" Mobility   Outcome Measure  Help needed turning from your back to your side while in a flat bed without using bedrails?: A Little Help needed moving from lying on your back to sitting on the side of a flat bed without using bedrails?: A Little Help needed moving  to and from a bed to a chair (including a wheelchair)?: A Lot Help needed standing up from a chair using your arms (e.g., wheelchair or bedside chair)?: A Lot Help needed to walk in hospital room?: A Lot Help needed climbing 3-5 steps with a railing? : Total 6 Click Score: 13    End of Session Equipment Utilized During Treatment: Gait belt Activity Tolerance: Patient tolerated treatment well Patient left: in bed;with call bell/phone within reach;with bed alarm set;with family/visitor present Nurse Communication: Mobility status PT Visit Diagnosis: Other abnormalities of gait and mobility (R26.89);Muscle weakness (generalized) (M62.81);Hemiplegia and hemiparesis Hemiplegia - Right/Left: Right Hemiplegia - dominant/non-dominant: Dominant Hemiplegia - caused by: Cerebral infarction (chronic from old L MCA CVA)     Time: 4650-3546 PT Time Calculation (min) (ACUTE ONLY): 34 min  Charges:  $Gait Training: 23-37 mins  Leighton Roach, PT  Acute Rehab Services Secure chat preferred Office [place] 01/24/2022, 1:59 PM

## 2022-01-24 NOTE — Evaluation (Signed)
Speech Language Pathology Evaluation Patient Details Name: Valerie Mcclure MRN: [id number] DOB: 1941/04/07 Today's Date: 01/24/2022 Time: 0165-5374 SLP Time Calculation (min) (ACUTE ONLY): 24 min  Problem List:  Patient Active Problem List   Diagnosis Date Noted   Acute metabolic encephalopathy 82/70/7867   Stroke-like symptoms 01/21/2022   Hypothermia 01/21/2022   UTI (urinary tract infection) 01/08/2022   Syncope 01/02/2022   General weakness    Skin ulcer of sacrum ([name])    Metabolic acidosis 54/49/2010   Normocytic anemia 12/11/2021   Thrombocytopenia ([place]) 12/11/2021   Memory deficit 12/11/2021   Syncope and collapse 12/09/2021   Stroke-like symptom 12/09/2021   SIRS (systemic inflammatory response syndrome) ([place]) 12/09/2021   Orthostatic hypotension 12/09/2021   Leg ulcer, left ([place]) 10/13/2021   Eye tearing, left 02/19/2020   TMJ syndrome, left 02/19/2020   Lump in neck 06/08/2018   Sleep difficulties 03/31/2018   Persistent atrial fibrillation ([place]) 09/06/2017   TIA (transient ischemic attack) 07/12/2017   Bradycardia 04/12/2017   Depression 01/29/2017   Fall    Aphasia as late effect of cerebrovascular accident (CVA)    Chronic diastolic heart failure ([name])    Dysarthria    Atrial flutter ([place]) 12/22/2016   Spastic hemiplegia of right dominant side as late effect of cerebral infarction ([place])    Anemia of chronic disease    Hypertension    Dysphagia, post-stroke    Anterior cerebral circulation hemorrhagic infarction ([place]) 12/03/2016   Right hemiparesis ([place])    Nontraumatic subcortical hemorrhage of left cerebral hemisphere ([place])    Mild aortic regurgitation 08/20/2016   Bilateral edema of lower extremity 04/07/2016   Pancreatic duct dilated 01/09/2016   Stage 3b chronic kidney disease (CKD) ([place]) 12/25/2015   Mild tricuspid regurgitation 12/25/2015   Mild mitral regurgitation 12/25/2015   Diabetes mellitus without complication ([place]) 01/28/1974    Abdominal pain, chronic, right upper quadrant 06/04/2015   Chronic low back pain    Pain in thoracic spine 04/18/2010   Coronary atherosclerosis 01/24/2009   ARTHRITIS, LEFT KNEE 12/13/2008   Hypothyroidism 11/21/2007   Hyperlipidemia 11/09/2007   Osteopenia 11/09/2007   Past Medical History:  Past Medical History:  Diagnosis Date   Blood transfusion without reported diagnosis    Chronic low back pain    History of echocardiogram    Echo 5/18: EF 55-60, Gr 2 DD, mild MR, mild LAE, PASP 47 // Echo 1/18:  EF 55, mild AI, MAC, mild MR, mild LAE, trivial pericardial effusion   History of ischemic left MCA stroke 11/2016   left MCA infarct status post mechanical thrombectomy complicated by large left basal ganglia hemorrhage with right shift.   HTN (hypertension)    Hyperlipidemia    Hypothyroidism    Osteopenia    Persistent atrial fibrillation ([name])    CHADS2-VASc=6 (female, age 33, HTN, CVA) // Apixaban // rate control strategy    Sacral wound    chronic sacral wound   Past Surgical History:  Past Surgical History:  Procedure Laterality Date   ABDOMINAL HYSTERECTOMY  1970   CHOLECYSTECTOMY  07/2009   Dr. Rise Patience   COLONOSCOPY  2003   FLEXIBLE SIGMOIDOSCOPY  2010   HAND SURGERY     IR ANGIO INTRA EXTRACRAN SEL COM CAROTID INNOMINATE UNI L MOD SED  11/29/2016   IR ANGIO VERTEBRAL SEL SUBCLAVIAN INNOMINATE UNI R MOD SED  11/29/2016   IR PERCUTANEOUS ART THROMBECTOMY/INFUSION INTRACRANIAL INC DIAG ANGIO  11/29/2016   IR RADIOLOGIST EVAL & MGMT  03/24/2017   LUMBAR LAMINECTOMY  11/2008   Done by Dr. Patrice Paradise   RADIOLOGY WITH ANESTHESIA N/A 11/29/2016   Procedure: RADIOLOGY WITH ANESTHESIA;  Surgeon: Radiologist, Medication, MD;  Location: [facility];  Service: Radiology;  Laterality: N/A;   THYROIDECTOMY     HPI:  81 y/o female presents to [facility] hospital 01/21/2022 with speech deficits and weakness. PT with chronic R weakness and baseline aphasia from prior CVA. Brain MRI negative. PMH includes  L MCA CVA, HTN, osteopenia, afib, sacral wound., memory deficits, anemia   Assessment / Plan / Recommendation Clinical Impression  Patient presents with an exacerbated aphasia from baseline with acute illness impacted both expressive worse than receptive communication. She will benefit from acute SLP services to maximize functional communication. Suspect that she will require f/u after discharge. Depending on improvements in communication and strength will determine discharge plan. She would benefit from CIR at this time. If improves physically, OP or HH SLP is an option.    SLP Assessment  SLP Recommendation/Assessment: Patient needs continued Speech [place] [facility] SLP Visit Diagnosis: Aphasia (R47.01)    Recommendations for follow up therapy are one component of a multi-disciplinary discharge planning process, led by the attending physician.  Recommendations may be updated based on patient status, additional functional criteria and insurance authorization.    Follow Up Recommendations   (TBD pending progress)    Assistance Recommended at Discharge  Frequent or constant Supervision/Assistance  Functional Status Assessment    Frequency and Duration min 2x/week  2 weeks      SLP Evaluation Cognition  Overall Cognitive Status: History of cognitive impairments - at baseline (difficult to assess currently due to aphasia)       Comprehension  Auditory Comprehension Overall Auditory Comprehension: Impaired Yes/No Questions: Impaired Basic Biographical Questions: 76-100% accurate Basic Immediate Environment Questions: 50-74% accurate Complex Questions: 25-49% accurate Commands: Impaired Multistep Basic Commands: 50-74% accurate Conversation: Simple EffectiveTechniques: Repetition;Visual/Gestural cues Visual Recognition/Discrimination Discrimination: Within Function Limits Reading Comprehension Reading Status: Within funtional limits    Expression Expression Primary  Mode of Expression: Verbal Verbal Expression Overall Verbal Expression: Impaired Initiation: No impairment Automatic Speech:  (impaired) Level of Generative/Spontaneous Verbalization: Phrase Repetition: Impaired Level of Impairment: Word level Naming: Impairment Responsive: 26-50% accurate Confrontation: Impaired Convergent: 50-74% accurate Divergent: 0-24% accurate Verbal Errors: Neologisms;Perseveration (inconsistently aware of errors) Pragmatics: No impairment Written Expression Dominant Hand: Right   Oral / Motor  Oral Motor/Sensory Function Overall Oral Motor/Sensory Function: Within functional limits Motor Speech Overall Motor Speech:  (TBD with improved aphasia)           Gabriel Rainwater MA, CCC-SLP  Valerie Mcclure 01/24/2022, 3:43 PM

## 2022-01-24 NOTE — Progress Notes (Signed)
Progress Note  Patient: MARANDA MARTE ZJQ:[id number] DOB: [birth year]  DOA: 01/21/2022  DOS: 01/24/2022    Brief hospital course: PAISLEIGH MARONEY is an 81 y.o. female with a history of CVA, residual right-sided weakness, dysarthria, HTN, HLD, chronic HFpEF, T2DM, stage IIIb CKD, atrial fibrillation who presented to the ED 7/5 with acute worsening of aphasia that day and dysuria beginning the prior evening at home.    CT head without acute abnormality, no LVO on CTA head/neck, and MR brain showed no acute intracranial pathology, unchanged remote left MCA distribution infarct.   She was hypothermic, and remained altered, found to have pyuria and bacteriuria for which ceftriaxone was started and urine culture sent.  Culture grew Citrobacter for which ID recommended bactrim.   7/7, recurrently retaining urine. Foley inserted.  On 7/8, her mental state has improved significantly, she is afebrile, though is not at her mobility baseline which makes discharge home at this time unsafe.   Assessment and Plan: Stroke-like symptoms: Has had repeatedly negative neuroimaging, including this admission. Had recent echo, so not repeating at this time.  - Continue eliquis, statin. D/w Dr. [name].      Sepsis due to Citrobacter UTI with acute metabolic encephalopathy on chronic memory deficit: With hypothermia, hypotension. Note very similar presentation in May 2023. - After discussion with ID, renally dosed bactrim ordered, given once 7/7 and changed to IV cefepime later due to sleeping and not wanting to interrupt that to take oral medications. I am concerned about inducible resistance to cefepime based on resistance to ancef, ceftriaxone, so will switch back to bactrim.   - Blood cultures NGTD.  Acute urinary retention: Possibly also chronically retaining as this is a recurrent finding and pt has recurrent UTIs.  - Foley inserted 7/7. Will plan to continue this until urology follow up in office post  discharge.   History of left MCA CVA with residual right-sided hemiplegia, dysarthria: At baseline functionally.  - Plan to resume HH PT, OT, RN at discharge based on reassessments this admission.   Persistent atrial fibrillation: Currently in NSR. - Continue metoprolol - Continue eliquis   Chronic HFpEF: Recent echo 5/23: showed EF 65-70% with indeterminate diastolic function and normal RV function. Moderate aortic regurgitation. Has chronic LE edema which is at approximate baseline based on my prior assessments.  - No changes to medications planned at this time.  - Restart demadex '10mg'$      Acute metabolic encephalopathy, depression: Worsened by husband's passing earlier this year.  - Continue SSRI, nortriptyline qHS.  - Delirium precautions reviewed in great detail today. Pt began sleeping for the first time in days yesterday afternoon and continued to do so through the night and this morning. To maintain day-night cycles, urged to interact, get OOB today, stay awake and plan to rest nocturnally. Continue trazodone qHS, olanzapine qHS and prn haldol, though hoping to avoid its use.   Well-controlled NIDT2DM: HbA1c 5.8%. - Continue SSI   Hypertension - Continue home meds, taking metoprolol '25mg'$  po BID.     Hyperlipidemia: LDL 35.  - Continue lipitor.    Hypothyroidism: TSH checked 6/14 and elevated, remains elevated in 5.1-5.9 range, though free T4 noted to be 1.35 not consistent with subtherapeutic synthroid.  - Continue synthroid at current dose.    Stage 3b CKD: Near baseline.  - Avoid nephrotoxins.   Anemia of chronic disease: Hgb 9.2 stable - Monitor intermittently.  Stage 2 right sacral pressure injury, POA:  - Offload, optimize mobility and  nutritional status.    Subjective:  Finally began sleeping yesterday after noon and slept throughout the night and this morning. Daughter initially concerned about pt's speech, though states it is at baseline during our assessment. Pt  without complaint, follows commands, but is disoriented.  Objective: Vitals:   01/23/22 1630 01/24/22 0544 01/24/22 0900 01/24/22 1138  BP: 132/86 (!) 134/52 (!) 110/58 (!) 121/49  Pulse: 86 75 73 69  Resp: '20  18 18  '$ Temp: 98.6 F (37 C) 98.1 F (36.7 C) 98.6 F (37 C) 97.6 F (36.4 C)  TempSrc: Axillary Axillary Oral Oral  SpO2: 90% 96% 100% 99%   Gen: Frail elderly female in no distress Pulm: Clear and nonlabored CV: Regular in 60's without MRG, trace dependent edema.   GI: Soft, NT, ND, +BS. Neuro: Alert, disoriented with dysarthria which is at her baseline. Stable right hemiparesis. Psych: Calm.  Data Personally reviewed: CBC: Recent Labs  Lab 01/21/22 1115 01/21/22 1119 01/22/22 0044 01/24/22 0221  WBC 5.2  --  4.2 6.2  NEUTROABS 3.2  --   --   --   HGB 9.2* 9.5* 9.1* 8.3*  HCT 28.6* 28.0* 29.1* 26.0*  MCV 95.0  --  94.2 94.5  PLT 218  --  227 970   Basic Metabolic Panel: Recent Labs  Lab 01/21/22 1115 01/21/22 1119 01/22/22 0044 01/24/22 0221  NA 138 135 138 141  K 3.6 3.5 4.2 3.6  CL 102 101 103 109  CO2 24  --  23 19*  GLUCOSE 118* 117* 136* 96  BUN '19 19 17 15  '$ CREATININE 1.48* 1.50* 1.43* 1.53*  CALCIUM 9.3  --  9.0 8.5*   GFR: Estimated Creatinine Clearance: 29.4 mL/min (A) (by C-G formula based on SCr of 1.53 mg/dL (H)). Liver Function Tests: Recent Labs  Lab 01/21/22 1115 01/24/22 0221  AST 21 13*  ALT 14 11  ALKPHOS 71 58  BILITOT 0.5 0.9  PROT 5.8* 5.1*  ALBUMIN 2.9* 2.4*   No results for input(s): "LIPASE", "AMYLASE" in the last 168 hours. No results for input(s): "AMMONIA" in the last 168 hours. Coagulation Profile: Recent Labs  Lab 01/21/22 1115  INR 1.3*   Cardiac Enzymes: No results for input(s): "CKTOTAL", "CKMB", "CKMBINDEX", "TROPONINI" in the last 168 hours. BNP (last 3 results) No results for input(s): "PROBNP" in the last 8760 hours. HbA1C: Recent Labs    01/22/22 0044  HGBA1C 5.8*   CBG: Recent Labs   Lab 01/23/22 1156 01/23/22 1643 01/23/22 2129 01/24/22 0619 01/24/22 1212  GLUCAP 109* 106* 98 83 93   Lipid Profile: Recent Labs    01/22/22 0044  CHOL 94  HDL 46  LDLCALC 35  TRIG 66  CHOLHDL 2.0   Thyroid Function Tests: Recent Labs    01/21/22 1452  TSH 5.974*   Anemia Panel: No results for input(s): "VITAMINB12", "FOLATE", "FERRITIN", "TIBC", "IRON", "RETICCTPCT" in the last 72 hours. Urine analysis:    Component Value Date/Time   COLORURINE YELLOW 01/21/2022 1500   APPEARANCEUR HAZY (A) 01/21/2022 1500   LABSPEC 1.013 01/21/2022 1500   PHURINE 5.0 01/21/2022 1500   GLUCOSEU NEGATIVE 01/21/2022 1500   GLUCOSEU NEGATIVE 11/18/2010 0859   HGBUR SMALL (A) 01/21/2022 1500   HGBUR negative 12/13/2008 0944   BILIRUBINUR NEGATIVE 01/21/2022 1500   KETONESUR NEGATIVE 01/21/2022 1500   PROTEINUR NEGATIVE 01/21/2022 1500   UROBILINOGEN 0.2 11/18/2010 0859   NITRITE POSITIVE (A) 01/21/2022 1500   LEUKOCYTESUR LARGE (A) 01/21/2022 1500  Recent Results (from the past 240 hour(s))  Resp Panel by RT-PCR (Flu A&B, Covid) Anterior Nasal Swab     Status: None   Collection Time: 01/21/22 11:18 AM   Specimen: Anterior Nasal Swab  Result Value Ref Range Status   SARS Coronavirus 2 by RT PCR NEGATIVE NEGATIVE Final    Comment: (NOTE) SARS-CoV-2 target nucleic acids are NOT DETECTED.  The SARS-CoV-2 RNA is generally detectable in upper respiratory specimens during the acute phase of infection. The lowest concentration of SARS-CoV-2 viral copies this assay can detect is 138 copies/mL. A negative result does not preclude SARS-Cov-2 infection and should not be used as the sole basis for treatment or other patient management decisions. A negative result may occur with  improper specimen collection/handling, submission of specimen other than nasopharyngeal swab, presence of viral mutation(s) within the areas targeted by this assay, and inadequate number of viral copies(<138  copies/mL). A negative result must be combined with clinical observations, patient history, and epidemiological information. The expected result is Negative.  Fact Sheet for Patients:  EntrepreneurPulse.com.au  Fact Sheet for Healthcare Providers:  IncredibleEmployment.be  This test is no t yet approved or cleared by the [place] FDA and  has been authorized for detection and/or diagnosis of SARS-CoV-2 by FDA under an Emergency Use Authorization (EUA). This EUA will remain  in effect (meaning this test can be used) for the duration of the COVID-19 declaration under Section 564(b)(1) of the Act, 21 U.S.C.section 360bbb-3(b)(1), unless the authorization is terminated  or revoked sooner.       Influenza A by PCR NEGATIVE NEGATIVE Final   Influenza B by PCR NEGATIVE NEGATIVE Final    Comment: (NOTE) The Xpert Xpress SARS-CoV-2/FLU/RSV plus assay is intended as an aid in the diagnosis of influenza from Nasopharyngeal swab specimens and should not be used as a sole basis for treatment. Nasal washings and aspirates are unacceptable for Xpert Xpress SARS-CoV-2/FLU/RSV testing.  Fact Sheet for Patients: EntrepreneurPulse.com.au  Fact Sheet for Healthcare Providers: IncredibleEmployment.be  This test is not yet approved or cleared by the [place] FDA and has been authorized for detection and/or diagnosis of SARS-CoV-2 by FDA under an Emergency Use Authorization (EUA). This EUA will remain in effect (meaning this test can be used) for the duration of the COVID-19 declaration under Section 564(b)(1) of the Act, 21 U.S.C. section 360bbb-3(b)(1), unless the authorization is terminated or revoked.  Performed at [facility], [place] [address], [place], [place] [address]   Urine Culture     Status: Abnormal   Collection Time: 01/21/22  2:14 PM   Specimen: Urine, Clean Catch  Result Value Ref Range  Status   Specimen Description URINE, CLEAN CATCH  Final   Special Requests   Final    NONE Performed at [facility], [place] [address] [place], [place] [address]    Culture >=100,000 COLONIES/mL CITROBACTER AMALONATICUS (A)  Final   Report Status 01/23/2022 FINAL  Final   Organism ID, Bacteria CITROBACTER AMALONATICUS (A)  Final      Susceptibility   Citrobacter amalonaticus - MIC*    CEFAZOLIN >=64 RESISTANT Resistant     CEFEPIME <=0.12 SENSITIVE Sensitive     CEFTRIAXONE 32 RESISTANT Resistant     CIPROFLOXACIN <=0.25 SENSITIVE Sensitive     GENTAMICIN <=1 SENSITIVE Sensitive     IMIPENEM <=0.25 SENSITIVE Sensitive     NITROFURANTOIN 32 SENSITIVE Sensitive     TRIMETH/SULFA <=20 SENSITIVE Sensitive     PIP/TAZO <=  4 SENSITIVE Sensitive     * >=100,000 COLONIES/mL CITROBACTER AMALONATICUS  Culture, blood (Routine X 2) w Reflex to ID Panel     Status: None (Preliminary result)   Collection Time: 01/21/22  2:27 PM   Specimen: BLOOD  Result Value Ref Range Status   Specimen Description BLOOD RIGHT ANTECUBITAL  Final   Special Requests   Final    BOTTLES DRAWN AEROBIC AND ANAEROBIC Blood Culture adequate volume   Culture   Final    NO GROWTH 3 DAYS Performed at [facility], [place] [address], [place], [place] [address]    Report Status PENDING  Incomplete  Culture, blood (Routine X 2) w Reflex to ID Panel     Status: None (Preliminary result)   Collection Time: 01/21/22  7:56 PM   Specimen: BLOOD  Result Value Ref Range Status   Specimen Description BLOOD RIGHT ANTECUBITAL  Final   Special Requests   Final    BOTTLES DRAWN AEROBIC AND ANAEROBIC Blood Culture adequate volume   Culture   Final    NO GROWTH 3 DAYS Performed at [facility], [place] [address], [place], [place] [address]    Report Status PENDING  Incomplete     Family Communication: Daughter at bedside  Disposition: Status is: Inpatient Remains inpatient appropriate because: Deconditioning  precluding safe discharge home per PT. Recommendation is continued therapy here and, if not improving as expected, SNF. Planned Discharge Destination: Home with [facility] when cleared by PT  Patrecia Pour, MD 01/24/2022 2:34 PM Page by [email or web address]

## 2022-01-24 NOTE — Discharge Summary (Incomplete)
Physician Discharge Summary   Patient: Valerie Mcclure MRN: [id number] DOB: 08-07-1940  Admit date:     01/21/2022  Discharge date: 01/24/22  Discharge Physician: Patrecia Pour   PCP: [name], MD   Recommendations at discharge:   ***  Discharge Diagnoses: Principal Problem:   Stroke-like symptoms Active Problems:   Hypothermia   Spastic hemiplegia of right dominant side as late effect of cerebral infarction ([name])   SIRS (systemic inflammatory response syndrome) ([name])   Persistent atrial fibrillation ([name])   Chronic diastolic heart failure ([name])   Diabetes mellitus without complication ([place])   Depression   Hypertension   Hyperlipidemia   Hypothyroidism   Stage 3b chronic kidney disease (CKD) ([place])   Anemia of chronic disease   Skin ulcer of sacrum ([place])   Acute metabolic encephalopathy  Resolved Problems:   * No resolved hospital problems. *  Hospital Course: No notes on file  Assessment and Plan: * Stroke-like symptoms 81 year old female presenting with acute onset of left sided weakness and dysarthric speech with risk factors for stroke including age, sex, previous CVA, HTN, HLD, family history  -observation to telemetry for TIA/CVA work up  [facility] and CTA head/neck negative for large CVA  -Neurochecks per protocol -Neurology consulted -MRI brain without contrast ordered -echo in 11/2021 with no shunts  -LDL and A1C pending. On statin, continue for now  -Continue eliquis  -Permissive hypertension first 24 hours <220/110 -N.p.o. until bedside swallow screen -PT/ OT/ SLP consult   Hypothermia Questionable if false reading or if in stool, she denies being cold. SIRs criteria with temp +PCO2 <32 -CXR and UA pending -urine culture -check blood cultures/lactic acid  -no leukocytosis  -check TSH  -repeat and trend  -bear hugger if needed  -could be secondary to age, immobility and neuro issues.   SIRS (systemic inflammatory response syndrome) ([name]) SIRS  criteria met with hypothermia and PCO2 Blood pressures have been soft, but MAP stable CXR with no acute findings UA pending and complains of dysuria. Culture pending. Will treat if UA suspicious Blood cx ordered Lactic acid pending Bear hugger if still hypothermic  Watch pressures closely   Spastic hemiplegia of right dominant side as late effect of cerebral infarction ([place]) Right side appears at baseline.  Continue eliquis PT/OT/ST   Persistent atrial fibrillation ([place]) In NSR, continue monitoring on telemetry  Continue eliquis. Hold lopressor to allow for permissive HTN in setting of possible acute CVA   Chronic diastolic heart failure ([name]) Has chronic LE edema, but not overloaded on exam  Recent echo 5/23: showed EF 65-70% with indeterminate diastolic function and normal RV function. Moderate aortic regurgitation.  -strict I/O   Depression Stable, continue prozac and pamelor qhs   Diabetes mellitus without complication ([name]) D9M of 6.6 in 09/2021 Repeat pending  SSI and accuchecks qac/hs   Hypertension Allow for permissive HTN in setting of cva work up Blood pressures soft, holding home medication   Hyperlipidemia Continue lipitor, goal LDL <70 Lipid panel pending   Hypothyroidism TSH checked 6/14 and elevated at 5.1  Will repeat today in setting of hypothermia   Stage 3b chronic kidney disease (CKD) ([place]) At baseline of creatinine 1.4 Continue to monitor   Anemia of chronic disease hgb 9.2 stable Continue to monitor   Skin ulcer of sacrum ([name]) Chronic, no exposed skin Wound care consult       {Tip this will not be part of the note when signed There is no  height or weight on file to calculate BMI. , ,  Active Pressure Injury/Wound(s)     Pressure Ulcer  Duration          Pressure Injury 01/22/22 Sacrum Right Stage 2 -  Partial thickness loss of dermis presenting as a shallow open injury with a red, pink wound bed without slough. 2 days            (Optional):26781}  {(NOTE) Pain control PDMP Statment (Optional):26782} Consultants: *** Procedures performed: ***  Disposition: {Plan; Disposition:26390} Diet recommendation:  {Diet_Plan:26776} DISCHARGE MEDICATION: Allergies as of 01/24/2022       Reactions   Clonazepam Other (See Comments)   AFFECTED MOOD NEGATIVELY- Do NOT give   Shrimp [shellfish Allergy] Anaphylaxis, Swelling, Rash, Other (See Comments)   Break outs and swelling of the throat   Valsartan Other (See Comments)   Renal failure   Tandem Plus [fefum-fepo-fa-b Cmp-c-zn-mn-cu] Nausea And Vomiting   Ivp Dye [iodinated Contrast Media] Itching, Rash   Penicillins Itching, Rash   Has patient had a PCN reaction causing immediate rash, facial/tongue/throat swelling, SOB or lightheadedness with hypotension: Yes Has patient had a PCN reaction causing severe rash involving mucus membranes or skin necrosis: No Has patient had a PCN reaction that required hospitalization: No Has patient had a PCN reaction occurring within the last 10 years: No If all of the above answers are "NO", then may proceed with Cephalosporin use.        Medication List     STOP taking these medications    clonazePAM 0.5 MG tablet Commonly known as: KLONOPIN       TAKE these medications    acetaminophen 500 MG tablet Commonly known as: TYLENOL Take 1,000 mg by mouth every 6 (six) hours as needed for mild pain or headache.   atorvastatin 20 MG tablet Commonly known as: LIPITOR TAKE 1 TABLET(20 MG) BY MOUTH DAILY What changed: See the new instructions.   Eliquis 2.5 MG Tabs tablet Generic drug: apixaban TAKE 1 TABLET(2.5 MG) BY MOUTH TWICE DAILY What changed: See the new instructions.   FLUoxetine 40 MG capsule Commonly known as: PROZAC TAKE 1 CAPSULE(40 MG) BY MOUTH DAILY What changed: See the new instructions.   levothyroxine 75 MCG tablet Commonly known as: SYNTHROID TAKE 1 TABLET(75 MCG) BY MOUTH DAILY BEFORE BREAKFAST.  FOLLOW-UP Strength: 75 mcg What changed:  how much to take how to take this when to take this additional instructions   metoprolol tartrate 25 MG tablet Commonly known as: LOPRESSOR Take 1 tablet (25 mg total) by mouth 2 (two) times daily.   nortriptyline 10 MG capsule Commonly known as: PAMELOR TAKE 4 CAPSULES(40 MG) BY MOUTH AT BEDTIME What changed: See the new instructions.   senna-docusate 8.6-50 MG tablet Commonly known as: Senokot-S Take 2 tablets by mouth 2 (two) times daily.   sulfamethoxazole-trimethoprim 400-80 MG tablet Commonly known as: BACTRIM Take 1 tablet by mouth 2 (two) times daily for 4 days.   tamsulosin 0.4 MG Caps capsule Commonly known as: FLOMAX Take 1 capsule (0.4 mg total) by mouth daily.   torsemide 10 MG tablet Commonly known as: DEMADEX Take 1 tablet (10 mg total) by mouth daily. What changed: when to take this               Durable Medical Equipment  (From admission, onward)           Start     Ordered   01/23/22 1413  For home use only DME  3 n 1  Once        01/23/22 1413              Discharge Care Instructions  (From admission, onward)           Start     Ordered   01/24/22 0000  No dressing needed        01/24/22 1158            Follow-up Information     Burns, Claudina Lick, MD. Schedule an appointment as soon as possible for a visit in 1 week(s).   Specialty: Internal Medicine Contact information: [place] [address] [place]. Schedule an appointment as soon as possible for a visit in 1 week(s).   Why: for urinary retention Contact information: [place] [phone]               Discharge Exam: There were no vitals filed for this visit. ***  Condition at discharge: {DC Condition:26389}  The results of significant diagnostics from this hospitalization (including imaging,  microbiology, ancillary and laboratory) are listed below for reference.   Imaging Studies: MR BRAIN WO CONTRAST  Result Date: 01/21/2022 CLINICAL DATA:  New left-sided weakness with history of stroke. EXAM: MRI HEAD WITHOUT CONTRAST TECHNIQUE: Multiplanar, multiecho pulse sequences of the brain and surrounding structures were obtained without intravenous contrast. COMPARISON:  Same-day CTA head/neck MRI head 12/09/2018, CTA head/neck 11/29/2016 FINDINGS: Brain: There is no evidence of acute intracranial hemorrhage, extra-axial fluid collection, or acute infarct. Encephalomalacia in the left basal ganglia and temporal lobe in the MCA distribution with associated chronic blood products is unchanged consistent with prior infarct. There is unchanged associated ex vacuo dilatation of the left lateral ventricle and wallerian degeneration in the left cerebral peduncle. The ventricles are otherwise normal in size and configuration. Background parenchymal volume is otherwise normal. There is no significant burden of underlying chronic white matter disease. There is no mass lesion.  There is no mass effect or midline shift. Vascular: Normal flow voids. Skull and upper cervical spine: Normal marrow signal. Sinuses/Orbits: The paranasal sinuses are clear. The globes and orbits are unremarkable. Other: There is a 1.5 cm T1 hypointense, diffusion restricting lesion in the right parotid gland. The lesion was present in 2018 but has slightly increased in size. IMPRESSION: 1. No acute intracranial pathology. 2. Unchanged remote infarct with associated chronic blood products in the left MCA distribution. 3. 1.5 cm lesion in the right parotid gland has slightly increased in size since 2018, favored to be benign in etiology given slow growth. Recommend nonemergent ENT referral. Electronically Signed   By: Valetta Mole M.D.   On: 01/21/2022 15:47   DG CHEST PORT 1 VIEW  Result Date: 01/21/2022 CLINICAL DATA:  81 year old female  with a history of hypothermia EXAM: PORTABLE CHEST 1 VIEW COMPARISON:  12/31/2021 FINDINGS: Cardiomediastinal silhouette unchanged in size and contour. No evidence of central vascular congestion. No interlobular septal thickening. No pneumothorax or pleural effusion. Coarsened interstitial markings, with no confluent airspace disease. No acute displaced fracture. IMPRESSION: Negative for acute cardiopulmonary disease Electronically Signed   By: Corrie Mckusick D.O.   On: 01/21/2022 13:23   CT ANGIO HEAD NECK W WO CM (CODE STROKE)  Result Date: 01/21/2022 CLINICAL DATA:  Patient presented with left-sided weakness and then developed aphasia. History of left MCA stroke. EXAM: CT ANGIOGRAPHY  HEAD AND NECK TECHNIQUE: Multidetector CT imaging of the head and neck was performed using the standard protocol during bolus administration of intravenous contrast. Multiplanar CT image reconstructions and MIPs were obtained to evaluate the vascular anatomy. Carotid stenosis measurements (when applicable) are obtained utilizing NASCET criteria, using the distal internal carotid diameter as the denominator. RADIATION DOSE REDUCTION: This exam was performed according to the departmental dose-optimization program which includes automated exposure control, adjustment of the mA and/or kV according to patient size and/or use of iterative reconstruction technique. CONTRAST:  17mL OMNIPAQUE IOHEXOL 350 MG/ML SOLN COMPARISON:  CT head 01/21/2022 FINDINGS: CTA NECK FINDINGS Aortic arch: Mild atherosclerotic calcification aortic arch and proximal great vessels. Proximal great vessels widely patent Right carotid system: Mild atherosclerotic calcification right carotid bifurcation without stenosis. Left carotid system: Atherosclerotic calcification left carotid bifurcation and proximal left internal carotid artery. 25% diameter stenosis proximal left internal carotid artery due to calcific plaque. Vertebral arteries: Both vertebral arteries  widely patent and normal Skeleton: Cervical spondylosis without acute skeletal abnormality. Other neck: 10 mm right thyroid nodule with peripheral calcification. No further imaging recommended. (Ref: J Am Coll Radiol. 2015 Feb;12(2): 143-50). Upper chest: Lung apices clear bilaterally. Review of the MIP images confirms the above findings CTA HEAD FINDINGS Anterior circulation: Mild atherosclerotic calcification in the cavernous carotid bilaterally without stenosis. Anterior and middle cerebral arteries patent without large vessel occlusion. Mild stenosis inferior division left middle cerebral artery. Right MCA widely patent. Anterior cerebral arteries widely patent bilaterally. Posterior circulation: Both vertebral arteries patent to the basilar without stenosis. PICA patent bilaterally. Basilar widely patent. Superior cerebellar and posterior cerebral arteries patent bilaterally without stenosis. Fetal origin left posterior cerebral artery. Venous sinuses: Normal venous enhancement Anatomic variants: None Review of the MIP images confirms the above findings IMPRESSION: 1. Negative for intracranial large vessel occlusion 2. Mild atherosclerotic disease in the carotid bifurcation without flow limiting stenosis. 25% diameter stenosis proximal left internal carotid artery. 3. These results were called by telephone at the time of interpretation on 01/21/2022 at 11:53 am to provider [facility] , who verbally acknowledged these results. Electronically Signed   By: Franchot Gallo M.D.   On: 01/21/2022 11:54   CT HEAD CODE STROKE WO CONTRAST  Result Date: 01/21/2022 CLINICAL DATA:  Code stroke. Acute neuro deficit. Left-sided weakness and dysarthria. EXAM: CT HEAD WITHOUT CONTRAST TECHNIQUE: Contiguous axial images were obtained from the base of the skull through the vertex without intravenous contrast. RADIATION DOSE REDUCTION: This exam was performed according to the departmental dose-optimization program which  includes automated exposure control, adjustment of the mA and/or kV according to patient size and/or use of iterative reconstruction technique. COMPARISON:  CT head 12/13/2021 FINDINGS: Brain: Negative for acute infarct, hemorrhage, mass. Chronic left MCA infarct unchanged. No right-sided infarct identified. Vascular: Negative for hyperdense vessel Skull: Negative Sinuses/Orbits: Paranasal sinuses clear.  Negative orbit Other: None ASPECTS ([place] Stroke Program Early CT Score) - Ganglionic level infarction (caudate, lentiform nuclei, internal capsule, insula, M1-M3 cortex): 7 - Supraganglionic infarction (M4-M6 cortex): 3 Total score (0-10 with 10 being normal): 10 IMPRESSION: 1. Negative for acute abnormality. Chronic left MCA infarct unchanged 2. ASPECTS is 10 3. Code stroke imaging results were communicated on 01/21/2022 at 11:34 am to provider Lorrin Goodell via text page Electronically Signed   By: Franchot Gallo M.D.   On: 01/21/2022 11:34   DG Chest 2 View  Result Date: 12/31/2021 CLINICAL DATA:  Syncope. EXAM: CHEST - 2 VIEW COMPARISON:  Chest radiograph  dated 12/13/2021. FINDINGS: Faint left lung base density, likely atelectasis. Developing infiltrate is not excluded. Trace left pleural effusion may be present. The right lung is clear. No pneumothorax. Stable cardiac silhouette. Atherosclerotic calcification of the aorta. No acute osseous pathology. IMPRESSION: Minimal left lung base atelectasis or less likely infiltrate. Overall significant improvement in the aeration of the lungs compared to prior radiograph and near complete resolution of the previously seen opacities. Electronically Signed   By: Elgie Collard M.D.   On: 12/31/2021 20:19    Microbiology: Results for orders placed or performed during the hospital encounter of 01/21/22  Resp Panel by RT-PCR (Flu A&B, Covid) Anterior Nasal Swab     Status: None   Collection Time: 01/21/22 11:18 AM   Specimen: Anterior Nasal Swab  Result Value Ref  Range Status   SARS Coronavirus 2 by RT PCR NEGATIVE NEGATIVE Final    Comment: (NOTE) SARS-CoV-2 target nucleic acids are NOT DETECTED.  The SARS-CoV-2 RNA is generally detectable in upper respiratory specimens during the acute phase of infection. The lowest concentration of SARS-CoV-2 viral copies this assay can detect is 138 copies/mL. A negative result does not preclude SARS-Cov-2 infection and should not be used as the sole basis for treatment or other patient management decisions. A negative result may occur with  improper specimen collection/handling, submission of specimen other than nasopharyngeal swab, presence of viral mutation(s) within the areas targeted by this assay, and inadequate number of viral copies(<138 copies/mL). A negative result must be combined with clinical observations, patient history, and epidemiological information. The expected result is Negative.  Fact Sheet for Patients:  BloggerCourse.com  Fact Sheet for Healthcare Providers:  SeriousBroker.it  This test is no t yet approved or cleared by the [place] FDA and  has been authorized for detection and/or diagnosis of SARS-CoV-2 by FDA under an Emergency Use Authorization (EUA). This EUA will remain  in effect (meaning this test can be used) for the duration of the COVID-19 declaration under Section 564(b)(1) of the Act, 21 U.S.C.section 360bbb-3(b)(1), unless the authorization is terminated  or revoked sooner.       Influenza A by PCR NEGATIVE NEGATIVE Final   Influenza B by PCR NEGATIVE NEGATIVE Final    Comment: (NOTE) The Xpert Xpress SARS-CoV-2/FLU/RSV plus assay is intended as an aid in the diagnosis of influenza from Nasopharyngeal swab specimens and should not be used as a sole basis for treatment. Nasal washings and aspirates are unacceptable for Xpert Xpress SARS-CoV-2/FLU/RSV testing.  Fact Sheet for  Patients: BloggerCourse.com  Fact Sheet for Healthcare Providers: SeriousBroker.it  This test is not yet approved or cleared by the [place] FDA and has been authorized for detection and/or diagnosis of SARS-CoV-2 by FDA under an Emergency Use Authorization (EUA). This EUA will remain in effect (meaning this test can be used) for the duration of the COVID-19 declaration under Section 564(b)(1) of the Act, 21 U.S.C. section 360bbb-3(b)(1), unless the authorization is terminated or revoked.  Performed at [facility], [address], [place], [place] [address]   Urine Culture     Status: Abnormal   Collection Time: 01/21/22  2:14 PM   Specimen: Urine, Clean Catch  Result Value Ref Range Status   Specimen Description URINE, CLEAN CATCH  Final   Special Requests   Final    NONE Performed at [facility], [address] [place], [place] [address]    Culture >=100,000 COLONIES/mL CITROBACTER AMALONATICUS (A)  Final  Report Status 01/23/2022 FINAL  Final   Organism ID, Bacteria CITROBACTER AMALONATICUS (A)  Final      Susceptibility   Citrobacter amalonaticus - MIC*    CEFAZOLIN >=64 RESISTANT Resistant     CEFEPIME <=0.12 SENSITIVE Sensitive     CEFTRIAXONE 32 RESISTANT Resistant     CIPROFLOXACIN <=0.25 SENSITIVE Sensitive     GENTAMICIN <=1 SENSITIVE Sensitive     IMIPENEM <=0.25 SENSITIVE Sensitive     NITROFURANTOIN 32 SENSITIVE Sensitive     TRIMETH/SULFA <=20 SENSITIVE Sensitive     PIP/TAZO <=4 SENSITIVE Sensitive     * >=100,000 COLONIES/mL CITROBACTER AMALONATICUS  Culture, blood (Routine X 2) w Reflex to ID Panel     Status: None (Preliminary result)   Collection Time: 01/21/22  2:27 PM   Specimen: BLOOD  Result Value Ref Range Status   Specimen Description BLOOD RIGHT ANTECUBITAL  Final   Special Requests   Final    BOTTLES DRAWN AEROBIC AND ANAEROBIC Blood Culture adequate volume   Culture    Final    NO GROWTH 3 DAYS Performed at [facility], [address] [place], [place] [address]    Report Status PENDING  Incomplete  Culture, blood (Routine X 2) w Reflex to ID Panel     Status: None (Preliminary result)   Collection Time: 01/21/22  7:56 PM   Specimen: BLOOD  Result Value Ref Range Status   Specimen Description BLOOD RIGHT ANTECUBITAL  Final   Special Requests   Final    BOTTLES DRAWN AEROBIC AND ANAEROBIC Blood Culture adequate volume   Culture   Final    NO GROWTH 3 DAYS Performed at [facility], [place] [address] [place], [place] [address]    Report Status PENDING  Incomplete    Labs: CBC: Recent Labs  Lab 01/21/22 1115 01/21/22 1119 01/22/22 0044 01/24/22 0221  WBC 5.2  --  4.2 6.2  NEUTROABS 3.2  --   --   --   HGB 9.2* 9.5* 9.1* 8.3*  HCT 28.6* 28.0* 29.1* 26.0*  MCV 95.0  --  94.2 94.5  PLT 218  --  227 748   Basic Metabolic Panel: Recent Labs  Lab 01/21/22 1115 01/21/22 1119 01/22/22 0044 01/24/22 0221  NA 138 135 138 141  K 3.6 3.5 4.2 3.6  CL 102 101 103 109  CO2 24  --  23 19*  GLUCOSE 118* 117* 136* 96  BUN $Re'19 19 17 15  'THR$ CREATININE 1.48* 1.50* 1.43* 1.53*  CALCIUM 9.3  --  9.0 8.5*   Liver Function Tests: Recent Labs  Lab 01/21/22 1115 01/24/22 0221  AST 21 13*  ALT 14 11  ALKPHOS 71 58  BILITOT 0.5 0.9  PROT 5.8* 5.1*  ALBUMIN 2.9* 2.4*   CBG: Recent Labs  Lab 01/23/22 0920 01/23/22 1156 01/23/22 1643 01/23/22 2129 01/24/22 0619  GLUCAP 110* 109* 106* 98 83    Discharge time spent: {LESS THAN/GREATER THAN:26388} 30 minutes.  Signed: Patrecia Pour, MD [facility] 01/24/2022

## 2022-01-25 DIAGNOSIS — G9341 Metabolic encephalopathy: Secondary | ICD-10-CM | POA: Diagnosis not present

## 2022-01-25 DIAGNOSIS — I5032 Chronic diastolic (congestive) heart failure: Secondary | ICD-10-CM | POA: Diagnosis not present

## 2022-01-25 DIAGNOSIS — D638 Anemia in other chronic diseases classified elsewhere: Secondary | ICD-10-CM | POA: Diagnosis not present

## 2022-01-25 DIAGNOSIS — R2990 Unspecified symptoms and signs involving the nervous system: Secondary | ICD-10-CM | POA: Diagnosis not present

## 2022-01-25 LAB — BASIC METABOLIC PANEL
Anion gap: 10 (ref 5–15)
BUN: 14 mg/dL (ref 8–23)
CO2: 22 mmol/L (ref 22–32)
Calcium: 8.1 mg/dL — ABNORMAL LOW (ref 8.9–10.3)
Chloride: 105 mmol/L (ref 98–111)
Creatinine, Ser: 1.53 mg/dL — ABNORMAL HIGH (ref 0.44–1.00)
GFR, Estimated: 34 mL/min — ABNORMAL LOW (ref >60)
Glucose, Bld: 113 mg/dL — ABNORMAL HIGH (ref 70–99)
Potassium: 3.3 mmol/L — ABNORMAL LOW (ref 3.5–5.1)
Sodium: 137 mmol/L (ref 135–145)

## 2022-01-25 LAB — GLUCOSE, CAPILLARY
Glucose-Capillary: 138 mg/dL — ABNORMAL HIGH (ref 70–99)
Glucose-Capillary: 120 mg/dL — ABNORMAL HIGH (ref 70–99)
Glucose-Capillary: 125 mg/dL — ABNORMAL HIGH (ref 70–99)
Glucose-Capillary: 154 mg/dL — ABNORMAL HIGH (ref 70–99)

## 2022-01-25 LAB — HEMOGLOBIN AND HEMATOCRIT, BLOOD
HCT: 25.9 % — ABNORMAL LOW (ref 36.0–46.0)
Hemoglobin: 8.2 g/dL — ABNORMAL LOW (ref 12.0–15.0)

## 2022-01-25 MED ORDER — POTASSIUM CHLORIDE CRYS ER 20 MEQ PO TBCR
40.0000 meq | EXTENDED_RELEASE_TABLET | Freq: Once | ORAL | Status: AC
  Administered 2022-01-25: 40 meq via ORAL
  Filled 2022-01-25: qty 2

## 2022-01-25 NOTE — Progress Notes (Signed)
Progress Note  Patient: Valerie Mcclure RAX:[id number] DOB: 1940-12-12  DOA: 01/21/2022  DOS: 01/25/2022    Brief hospital course: Valerie Mcclure is an 81 y.o. female with a history of CVA, residual right-sided weakness, dysarthria, HTN, HLD, chronic HFpEF, T2DM, stage IIIb CKD, atrial fibrillation who presented to the ED 7/5 with acute worsening of aphasia that day and dysuria beginning the prior evening at home.    CT head without acute abnormality, no LVO on CTA head/neck, and MR brain showed no acute intracranial pathology, unchanged remote left MCA distribution infarct.   She was hypothermic, and remained altered, found to have pyuria and bacteriuria for which ceftriaxone was started and urine culture sent.  Culture grew Citrobacter for which ID recommended bactrim.   7/7, recurrently retaining urine. Foley inserted.  On 7/8, her mental state has improved significantly, she is afebrile, though is not at her mobility baseline which makes discharge home at this time unsafe.   Assessment and Plan: Stroke-like symptoms: Has had repeatedly negative neuroimaging, including this admission. Had recent echo, so not repeating at this time.  - Continue eliquis, statin. D/w Dr. [name].      Sepsis due to Citrobacter UTI with acute metabolic encephalopathy on chronic memory deficit: With hypothermia, hypotension. Note very similar presentation in May 2023. - After discussion with ID, renally dosed bactrim ordered, given once 7/7 and changed to IV cefepime later due to sleeping and not wanting to interrupt that to take oral medications. I am concerned about inducible resistance to cefepime based on resistance to ancef, ceftriaxone, so will switch back to bactrim.   - Blood cultures NGTD x4 days. Tmax 99.72F/24hrs .  Acute urinary retention: Possibly also chronically retaining as this is a recurrent finding and pt has recurrent UTIs.  - Foley inserted 7/7. Will plan to continue this until urology  follow up in office post discharge.   History of left MCA CVA with residual right-sided hemiplegia, dysarthria:   - Plan to resume HH PT, OT, RN at discharge based on reassessments this admission. - Negative acute work up, but deconditioning has causing slight functional decline on precarious baseline. Will continue PT daily here and if not progressing, would need SNF.   Persistent atrial fibrillation: Currently in NSR. - Continue metoprolol - Continue eliquis   Chronic HFpEF: Recent echo 5/23: showed EF 65-70% with indeterminate diastolic function and normal RV function. Moderate aortic regurgitation. Has chronic LE edema which is at approximate baseline based on my prior assessments.  - No changes to medications planned at this time.  - Restart demadex '10mg'$      Acute metabolic encephalopathy, depression: Worsened by husband's passing earlier this year.  - Continue SSRI, nortriptyline qHS.  - Delirium precautions reviewed in great detail today. - To maintain day-night cycles, urged to interact, get OOB today, stay awake and plan to rest nocturnally. Continue trazodone qHS, olanzapine qHS   Well-controlled NIDT2DM: HbA1c 5.8%. - Continue SSI   Hypertension - Continue home meds, taking metoprolol '25mg'$  po BID.     Hyperlipidemia: LDL 35.  - Continue lipitor.    Hypothyroidism: TSH checked 6/14 and elevated, remains elevated in 5.1-5.9 range, though free T4 noted to be 1.35 not consistent with subtherapeutic synthroid.  - Continue synthroid at current dose.    Stage 3b CKD: Near baseline.  - Avoid nephrotoxins.   Anemia of chronic disease: Hgb 9.2 stable - Monitor intermittently.  Stage 2 right sacral pressure injury, POA:  - Offload, optimize mobility and  nutritional status.    Subjective:  No further agitation, remains confused at approximate baseline. Worked with PT yesterday who stated she appeared unsteady more than usual and her daughter may not be able to assist  sufficiently at home at this current level. Daughter at bedside this morning says she's been more calm all night.  Objective: Vitals:   01/24/22 1941 01/24/22 2350 01/25/22 0400 01/25/22 0810  BP: (!) 136/54 (!) 131/48 (!) 133/55 (!) 128/49  Pulse: 74 66 62 62  Resp: '18 18  18  '$ Temp: 99.3 F (37.4 C) 98.3 F (36.8 C) 98.4 F (36.9 C) 97.7 F (36.5 C)  TempSrc: Oral Oral Oral Oral  SpO2: 100% 98% 98% 97%   Gen: Frail elderly female in no distress Pulm: Clear, nonlabored CV: RRR, trace stable lower leg edema at ankles   GI: +BS, soft, NT, ND Neuro: Alert, pleasantly confused with dysarthria. No new deficits on limited exam, moving all extremities spontaneously. Psych: Calm, interactive.  Data Personally reviewed: CBC: Recent Labs  Lab 01/21/22 1115 01/21/22 1119 01/22/22 0044 01/24/22 0221 01/25/22 0111  WBC 5.2  --  4.2 6.2  --   NEUTROABS 3.2  --   --   --   --   HGB 9.2* 9.5* 9.1* 8.3* 8.2*  HCT 28.6* 28.0* 29.1* 26.0* 25.9*  MCV 95.0  --  94.2 94.5  --   PLT 218  --  227 184  --    Basic Metabolic Panel: Recent Labs  Lab 01/21/22 1115 01/21/22 1119 01/22/22 0044 01/24/22 0221 01/25/22 0111  NA 138 135 138 141 137  K 3.6 3.5 4.2 3.6 3.3*  CL 102 101 103 109 105  CO2 24  --  23 19* 22  GLUCOSE 118* 117* 136* 96 113*  BUN '19 19 17 15 14  '$ CREATININE 1.48* 1.50* 1.43* 1.53* 1.53*  CALCIUM 9.3  --  9.0 8.5* 8.1*   GFR: Estimated Creatinine Clearance: 29.4 mL/min (A) (by C-G formula based on SCr of 1.53 mg/dL (H)). Liver Function Tests: Recent Labs  Lab 01/21/22 1115 01/24/22 0221  AST 21 13*  ALT 14 11  ALKPHOS 71 58  BILITOT 0.5 0.9  PROT 5.8* 5.1*  ALBUMIN 2.9* 2.4*   No results for input(s): "LIPASE", "AMYLASE" in the last 168 hours. No results for input(s): "AMMONIA" in the last 168 hours. Coagulation Profile: Recent Labs  Lab 01/21/22 1115  INR 1.3*   Cardiac Enzymes: No results for input(s): "CKTOTAL", "CKMB", "CKMBINDEX", "TROPONINI"  in the last 168 hours. BNP (last 3 results) No results for input(s): "PROBNP" in the last 8760 hours. HbA1C: No results for input(s): "HGBA1C" in the last 72 hours.  CBG: Recent Labs  Lab 01/24/22 1212 01/24/22 1602 01/24/22 2147 01/25/22 0611 01/25/22 0806  GLUCAP 93 112* 132* 138* 120*   Lipid Profile: No results for input(s): "CHOL", "HDL", "LDLCALC", "TRIG", "CHOLHDL", "LDLDIRECT" in the last 72 hours.  Thyroid Function Tests: No results for input(s): "TSH", "T4TOTAL", "FREET4", "T3FREE", "THYROIDAB" in the last 72 hours.  Anemia Panel: No results for input(s): "VITAMINB12", "FOLATE", "FERRITIN", "TIBC", "IRON", "RETICCTPCT" in the last 72 hours. Urine analysis:    Component Value Date/Time   COLORURINE YELLOW 01/21/2022 1500   APPEARANCEUR HAZY (A) 01/21/2022 1500   LABSPEC 1.013 01/21/2022 1500   PHURINE 5.0 01/21/2022 1500   GLUCOSEU NEGATIVE 01/21/2022 1500   GLUCOSEU NEGATIVE 11/18/2010 0859   HGBUR SMALL (A) 01/21/2022 1500   HGBUR negative 12/13/2008 [place] 01/21/2022  [place] 01/21/2022 1500   PROTEINUR NEGATIVE 01/21/2022 1500   UROBILINOGEN 0.2 11/18/2010 0859   NITRITE POSITIVE (A) 01/21/2022 1500   LEUKOCYTESUR LARGE (A) 01/21/2022 1500   Recent Results (from the past 240 hour(s))  Resp Panel by RT-PCR (Flu A&B, Covid) Anterior Nasal Swab     Status: None   Collection Time: 01/21/22 11:18 AM   Specimen: Anterior Nasal Swab  Result Value Ref Range Status   SARS Coronavirus 2 by RT PCR NEGATIVE NEGATIVE Final    Comment: (NOTE) SARS-CoV-2 target nucleic acids are NOT DETECTED.  The SARS-CoV-2 RNA is generally detectable in upper respiratory specimens during the acute phase of infection. The lowest concentration of SARS-CoV-2 viral copies this assay can detect is 138 copies/mL. A negative result does not preclude SARS-Cov-2 infection and should not be used as the sole basis for treatment or other patient  management decisions. A negative result may occur with  improper specimen collection/handling, submission of specimen other than nasopharyngeal swab, presence of viral mutation(s) within the areas targeted by this assay, and inadequate number of viral copies(<138 copies/mL). A negative result must be combined with clinical observations, patient history, and epidemiological information. The expected result is Negative.  Fact Sheet for Patients:  EntrepreneurPulse.com.au  Fact Sheet for Healthcare Providers:  IncredibleEmployment.be  This test is no t yet approved or cleared by the [place] FDA and  has been authorized for detection and/or diagnosis of SARS-CoV-2 by FDA under an Emergency Use Authorization (EUA). This EUA will remain  in effect (meaning this test can be used) for the duration of the COVID-19 declaration under Section 564(b)(1) of the Act, 21 U.S.C.section 360bbb-3(b)(1), unless the authorization is terminated  or revoked sooner.       Influenza A by PCR NEGATIVE NEGATIVE Final   Influenza B by PCR NEGATIVE NEGATIVE Final    Comment: (NOTE) The Xpert Xpress SARS-CoV-2/FLU/RSV plus assay is intended as an aid in the diagnosis of influenza from Nasopharyngeal swab specimens and should not be used as a sole basis for treatment. Nasal washings and aspirates are unacceptable for Xpert Xpress SARS-CoV-2/FLU/RSV testing.  Fact Sheet for Patients: EntrepreneurPulse.com.au  Fact Sheet for Healthcare Providers: IncredibleEmployment.be  This test is not yet approved or cleared by the [place] FDA and has been authorized for detection and/or diagnosis of SARS-CoV-2 by FDA under an Emergency Use Authorization (EUA). This EUA will remain in effect (meaning this test can be used) for the duration of the COVID-19 declaration under Section 564(b)(1) of the Act, 21 U.S.C. section 360bbb-3(b)(1),  unless the authorization is terminated or revoked.  Performed at [facility], [place] [address] [place], [place] [address]   Urine Culture     Status: Abnormal   Collection Time: 01/21/22  2:14 PM   Specimen: Urine, Clean Catch  Result Value Ref Range Status   Specimen Description URINE, CLEAN CATCH  Final   Special Requests   Final    NONE Performed at [facility], [place] [address], [place], [place] [address]    Culture >=100,000 COLONIES/mL CITROBACTER AMALONATICUS (A)  Final   Report Status 01/23/2022 FINAL  Final   Organism ID, Bacteria CITROBACTER AMALONATICUS (A)  Final      Susceptibility   Citrobacter amalonaticus - MIC*    CEFAZOLIN >=64 RESISTANT Resistant     CEFEPIME <=0.12 SENSITIVE Sensitive     CEFTRIAXONE 32 RESISTANT Resistant     CIPROFLOXACIN <=0.25 SENSITIVE Sensitive  GENTAMICIN <=1 SENSITIVE Sensitive     IMIPENEM <=0.25 SENSITIVE Sensitive     NITROFURANTOIN 32 SENSITIVE Sensitive     TRIMETH/SULFA <=20 SENSITIVE Sensitive     PIP/TAZO <=4 SENSITIVE Sensitive     * >=100,000 COLONIES/mL CITROBACTER AMALONATICUS  Culture, blood (Routine X 2) w Reflex to ID Panel     Status: None (Preliminary result)   Collection Time: 01/21/22  2:27 PM   Specimen: BLOOD  Result Value Ref Range Status   Specimen Description BLOOD RIGHT ANTECUBITAL  Final   Special Requests   Final    BOTTLES DRAWN AEROBIC AND ANAEROBIC Blood Culture adequate volume   Culture   Final    NO GROWTH 4 DAYS Performed at [facility], [address], [place], [place] [address]    Report Status PENDING  Incomplete  Culture, blood (Routine X 2) w Reflex to ID Panel     Status: None (Preliminary result)   Collection Time: 01/21/22  7:56 PM   Specimen: BLOOD  Result Value Ref Range Status   Specimen Description BLOOD RIGHT ANTECUBITAL  Final   Special Requests   Final    BOTTLES DRAWN AEROBIC AND ANAEROBIC Blood Culture adequate volume   Culture   Final    NO GROWTH 4  DAYS Performed at [facility], [place] [address] [place], [place] [address]    Report Status PENDING  Incomplete     Family Communication: Daughter at bedside  Disposition: Status is: Inpatient Remains inpatient appropriate because: Deconditioning precluding safe discharge home per PT, will reevaluate today. Recommendation is continued therapy here and, if not improving as expected, SNF. Planned Discharge Destination: Home with [place] when cleared by PT  Patrecia Pour, MD 01/25/2022 9:19 AM Page by [email or web address]

## 2022-01-25 NOTE — Progress Notes (Signed)
Physical Therapy Treatment Patient Details Name: Valerie Mcclure MRN: [id number] DOB: January 27, 1941 Today's Date: 01/25/2022   History of Present Illness 81 y/o female [facility] hospital [facility]21/2022 with speech deficits and weakness. PT with chronic R weakness and baseline aphasia from prior CVA. Brain MRI negative. PMH includes L MCA CVA, HTN, osteopenia, afib, sacral wound, memory deficits, and anemia    PT Comments    The pt was agreeable to session, eager to progress mobility and independence to allow for safest return home. The pt continues to need modA to complete bed mobility, sit-stand transfers, and short bout of ambulation (~40 ft) with heavy assist with turning and when fatigued. The pt also needs max cues for sequencing and intermittent assist to RW as well for managing during turns. The pt continues to need acute PT intervention to address safety and mobility with transfers and house-level distances of ambulation.     Recommendations for follow up therapy are one component of a multi-disciplinary discharge planning process, led by the attending physician.  Recommendations may be updated based on patient status, additional functional criteria and insurance authorization.  Follow Up Recommendations  Home health PT     Assistance Recommended at Discharge Frequent or constant Supervision/Assistance  Patient can return home with the following A lot of help with walking and/or transfers;A lot of help with bathing/dressing/bathroom;Assistance with cooking/housework;Direct supervision/assist for medications management;Direct supervision/assist for financial management;Assist for transportation;Help with stairs or ramp for entrance   Equipment Recommendations  BSC/3in1    Recommendations for Other Services       Precautions / Restrictions Precautions Precautions: Fall Precaution Comments: R-sided coordination deficits, foley Restrictions Weight Bearing Restrictions: No      Mobility  Bed Mobility Overal bed mobility: Needs Assistance Bed Mobility: Supine to Sit, Sit to Supine     Supine to sit: Mod assist Sit to supine: Mod assist   General bed mobility comments: modA to manage LE movement to EOB and to pull up on therapist to elevate trunk, minG for pt to complete scooting with increased time. moda to return to bed with sequential repositioning as pt unable to control trunk position with return to bed    Transfers Overall transfer level: Needs assistance Equipment used: Rolling walker (2 wheels) Transfers: Sit to/from Stand Sit to Stand: Mod assist           General transfer comment: mod A for sit to stand due to heavy posterior lean and difficulty maintaining hold of RW with R hand. Mod A for stand>sit due to uncontrolled descent. completed x4    Ambulation/Gait Ambulation/Gait assistance: Mod assist, +2 safety/equipment Gait Distance (Feet): 40 Feet Assistive device: Rolling walker (2 wheels) Gait Pattern/deviations: Step-through pattern, Leaning posteriorly, Staggering left, Decreased step length - right, Ataxic Gait velocity: reduced     General Gait Details: pt with L and posterior lean throughout gait. modA to maintain upright and to advance RW at times. significant difficulty with turning and needinc max cues and assist to remain inside RW       Balance Overall balance assessment: Needs assistance Sitting-balance support: No upper extremity supported, Feet supported Sitting balance-Leahy Scale: Fair Sitting balance - Comments: able to maintain balance EOB but posterior lean noted with pertubation Postural control: Posterior lean Standing balance support: Bilateral upper extremity supported, During functional activity Standing balance-Leahy Scale: Poor Standing balance comment: relies on BUE and external support  Cognition Arousal/Alertness: Awake/alert Behavior During Therapy: WFL for  tasks assessed/performed Overall Cognitive Status: History of cognitive impairments - at baseline                                 General Comments: pt following commands with max verbal and tactile cues but needs increased time and is apraxic at baseline. incontinent of bowel and unaware        Exercises General Exercises - Lower Extremity Ankle Circles/Pumps: AROM, Both, 10 reps, Supine Quad Sets: AROM, Both, 10 reps, Supine Heel Slides: AROM, Both, 10 reps, Supine    General Comments General comments (skin integrity, edema, etc.): VSS on RA, incontinent of bowel      Pertinent Vitals/Pain Pain Assessment Pain Assessment: No/denies pain     PT Goals (current goals can now be found in the care plan section) Acute Rehab PT Goals Patient Stated Goal: to go home PT Goal Formulation: With patient Time For Goal Achievement: 02/04/22 Potential to Achieve Goals: Fair Progress towards PT goals: Progressing toward goals    Frequency    Min 3X/week      PT Plan Current plan remains appropriate       AM-PAC PT "6 Clicks" Mobility   Outcome Measure  Help needed turning from your back to your side while in a flat bed without using bedrails?: A Little Help needed moving from lying on your back to sitting on the side of a flat bed without using bedrails?: A Little Help needed moving to and from a bed to a chair (including a wheelchair)?: A Lot Help needed standing up from a chair using your arms (e.g., wheelchair or bedside chair)?: A Lot Help needed to walk in hospital room?: A Lot Help needed climbing 3-5 steps with a railing? : Total 6 Click Score: 13    End of Session Equipment Utilized During Treatment: Gait belt Activity Tolerance: Patient tolerated treatment well Patient left: in bed;with call bell/phone within reach;with bed alarm set;with family/visitor present Nurse Communication: Mobility status PT Visit Diagnosis: Other abnormalities of gait and  mobility (R26.89);Muscle weakness (generalized) (M62.81);Hemiplegia and hemiparesis Hemiplegia - Right/Left: Right Hemiplegia - dominant/non-dominant: Dominant Hemiplegia - caused by: Cerebral infarction (chronic from old L MCA CVA)     Time: 7209-4709 PT Time Calculation (min) (ACUTE ONLY): 26 min  Charges:  $Gait Training: 8-22 mins $Therapeutic Activity: 8-22 mins                     [place] Carbo, PT, DPT   Acute Rehabilitation Department   Sandra Cockayne 01/25/2022, 2:33 PM

## 2022-01-26 ENCOUNTER — Inpatient Hospital Stay (HOSPITAL_COMMUNITY): Payer: HMO

## 2022-01-26 DIAGNOSIS — R2990 Unspecified symptoms and signs involving the nervous system: Secondary | ICD-10-CM | POA: Diagnosis not present

## 2022-01-26 DIAGNOSIS — A419 Sepsis, unspecified organism: Secondary | ICD-10-CM

## 2022-01-26 DIAGNOSIS — A498 Other bacterial infections of unspecified site: Secondary | ICD-10-CM

## 2022-01-26 DIAGNOSIS — R410 Disorientation, unspecified: Secondary | ICD-10-CM

## 2022-01-26 DIAGNOSIS — M25562 Pain in left knee: Secondary | ICD-10-CM

## 2022-01-26 DIAGNOSIS — R338 Other retention of urine: Secondary | ICD-10-CM

## 2022-01-26 LAB — GLUCOSE, CAPILLARY
Glucose-Capillary: 106 mg/dL — ABNORMAL HIGH (ref 70–99)
Glucose-Capillary: 99 mg/dL (ref 70–99)
Glucose-Capillary: 101 mg/dL — ABNORMAL HIGH (ref 70–99)
Glucose-Capillary: 94 mg/dL (ref 70–99)

## 2022-01-26 LAB — CULTURE, BLOOD (ROUTINE X 2)
Culture: NO GROWTH
Culture: NO GROWTH
Special Requests: ADEQUATE
Special Requests: ADEQUATE

## 2022-01-26 NOTE — Progress Notes (Signed)
Overnight progress note  Notified by RN that patient was on bedside commode and had a large bowel movement (brown stool with a small amount of blood).  Soon after she became lethargic and slumped over to the left.  RN concerned that her left side was weak.  Patient was conscious at that time but not responding.  Nursing staff then helped her go back to bed and soon after laying in bed she became more alert and started talking and is now back to baseline.  Blood pressure 104/44 with MAP 66, heart rate 58.  RN appreciated new left lower lobe crackles on exam.  Patient is satting 97% on room air, respiratory rate 16.  -?Vasovagal event, however, given concern for left-sided weakness during this episode, stat CT head ordered. -Hold Eliquis and stat CBC ordered -Hold antihypertensives at this time (metoprolol and torsemide) -Stat chest x-ray -Monitor very closely

## 2022-01-26 NOTE — Assessment & Plan Note (Signed)
X ray

## 2022-01-26 NOTE — Progress Notes (Signed)
PROGRESS NOTE    Valerie Mcclure  JYN:[id number] DOB: 28-Feb-1941 DOA: 01/21/2022 PCP: [name], MD  Chief Complaint  Patient presents with   Code Stroke    Brief Narrative:  Valerie Mcclure is an 81 y.o. female with Valerie Mcclure history of CVA, residual right-sided weakness, dysarthria, HTN, HLD, chronic HFpEF, T2DM, stage IIIb CKD, atrial fibrillation who presented to the ED 7/5 with acute worsening of aphasia that day and dysuria beginning the prior evening at home.     CT head without acute abnormality, no LVO on CTA head/neck, and MR brain showed no acute intracranial pathology, unchanged remote left MCA distribution infarct.    She was hypothermic, and remained altered, found to have pyuria and bacteriuria for which ceftriaxone was started and urine culture sent.  Culture grew Citrobacter for which ID recommended bactrim.    7/7, recurrently retaining urine. Foley inserted.   On 7/8, her mental state has improved significantly, she is afebrile, though is not at her mobility baseline which makes discharge home at this time unsafe.     Assessment & Plan:   Principal Problem:   Stroke-like symptoms Active Problems:   Hypothermia   Sepsis secondary to UTI ([name])   Citrobacter infection   Spastic hemiplegia of right dominant side as late effect of cerebral infarction ([name])   SIRS (systemic inflammatory response syndrome) ([name])   Acute urinary retention   Persistent atrial fibrillation ([name])   Delirium   Chronic diastolic heart failure ([name])   Diabetes mellitus without complication ([name])   Depression   Hypertension   Hyperlipidemia   Hypothyroidism   Stage 3b chronic kidney disease (CKD) ([name])   Left knee pain   Anemia of chronic disease   Skin ulcer of sacrum ([name])   Acute metabolic encephalopathy   Assessment and Plan: * Stroke-like symptoms 81 year old female presenting with acute onset of left sided weakness and dysarthric speech with risk factors for stroke  including age, sex, previous CVA, HTN, HLD, family history  MRI without acute intracranial pathology, remote infarct with associated chronic blood products in L MCA distribution CTA without intracranial LVO, mild atherosclerotic disease in carotid bifurcation without flow limiting stenosis, 25% diameter stenosis proximal L ICA Echo was done recently (11/2021) and EF 65-70% (see report) Carotid US 11/2021 with 1-39%  Continue eliquis, statin.  Dr. [name] discussed with Dr. [name].  Sepsis secondary to UTI [facility]) With hypothermia, hypotension, encephalopathy Bactrim per discussion with ID Blood cx NGx5 Urine cx with citrobacter resistant to cefazolin and ceftriaxone  Acute urinary retention Foley inserted 7/7 - possibly related to UTI? Removed 7/10, will follow bladder scans - hopefully we can avoid indwelling foley at home for pt with delirium and UTI   Spastic hemiplegia of right dominant side as late effect of cerebral infarction ([place]) Right side appears at baseline.  Continue eliquis Continue therapy, likely will d/c home with home health  Delirium Persistent, follow   Persistent atrial fibrillation ([name]) eliquis metoprolol  Chronic diastolic heart failure ([name]) Has chronic LE edema, but not overloaded on exam  Recent echo 5/23: showed EF 65-70% with indeterminate diastolic function and normal RV function. Moderate aortic regurgitation.  Torsemide 10 mg daily  Depression Stable, continue prozac and pamelor qhs  Worsened in setting of husbands passing this year, grief Delirium precautions  Diabetes mellitus without complication ([name]) Q6V of 5.8 SSI and accuchecks qac/hs   Hypertension Metoprolol, torsemide   Hyperlipidemia Continue lipitor, goal LDL <70 LDL 35  Hypothyroidism Synthroid at current dose Repeat TFT outpatient  Left knee pain X ray  Stage 3b chronic kidney disease (CKD) ([name]) At baseline of creatinine 1.4 stable Continue to monitor   Skin ulcer  of sacrum ([name]) Chronic, no exposed skin Wound care consult     Pressure Injury 01/22/22 Sacrum Right Stage 2 -  Partial thickness loss of dermis presenting as Valerie Mcclure shallow open injury with Valerie Mcclure red, pink wound bed without slough. (Active)  01/22/22 0820  Location: Sacrum  Location Orientation: Right  Staging: Stage 2 -  Partial thickness loss of dermis presenting as Valerie Mcclure shallow open injury with Valerie Mcclure red, pink wound bed without slough.  Wound Description (Comments):   Present on Admission: Yes      Anemia of chronic disease hgb 9.2 stable Continue to monitor       DVT prophylaxis: eliquis Code Status: DNR Family Communication: daughter at bedside Disposition:   Status is: Inpatient Remains inpatient appropriate because: continued delirium, awaiting safe discharge plan   Consultants:  neurology  Procedures:  none  Antimicrobials:  Anti-infectives (From admission, onward)    Start     Dose/Rate Route Frequency Ordered Stop   01/24/22 1530  sulfamethoxazole-trimethoprim (BACTRIM) 400-80 MG per tablet 1 tablet        1 tablet Oral Every 12 hours 01/24/22 1436     01/24/22 0000  ceFEPIme (MAXIPIME) 2 g in sodium chloride 0.9 % 100 mL IVPB  Status:  Discontinued        2 g 200 mL/hr over 30 Minutes Intravenous Every 24 hours 01/23/22 2314 01/24/22 1436   01/24/22 0000  sulfamethoxazole-trimethoprim (BACTRIM) 400-80 MG tablet        1 tablet Oral 2 times daily 01/24/22 1158 01/28/22 2359   01/23/22 1215  sulfamethoxazole-trimethoprim (BACTRIM) 400-80 MG per tablet 1 tablet  Status:  Discontinued        1 tablet Oral Every 12 hours 01/23/22 1118 01/23/22 2250   01/21/22 1700  cefTRIAXone (ROCEPHIN) 1 g in sodium chloride 0.9 % 100 mL IVPB  Status:  Discontinued        1 g 200 mL/hr over 30 Minutes Intravenous Every 24 hours 01/21/22 1646 01/23/22 1118       Subjective: Confused, daughter at bedside  Objective: Vitals:   01/26/22 0343 01/26/22 0729 01/26/22 1058 01/26/22  1514  BP: (!) 104/44 (!) 124/51 (!) 111/44 (!) 118/47  Pulse: 68 62 63 61  Resp: '20 18 18 19  '$ Temp: 97.8 F (36.6 C) 98.3 F (36.8 C) 98.1 F (36.7 C) 97.7 F (36.5 C)  TempSrc: Oral Oral Oral Oral  SpO2: 100% 99% 99% 99%    Intake/Output Summary (Last 24 hours) at 01/26/2022 1722 Last data filed at 01/26/2022 0830 Gross per 24 hour  Intake 420 ml  Output 1450 ml  Net -1030 ml   There were no vitals filed for this visit.  Examination:  General exam: Appears calm and comfortable  Respiratory system: Clear to auscultation. Respiratory effort normal. Cardiovascular system: S1 & S2 heard, RRR. No JVD, murmurs, rubs, gallops or clicks. No pedal edema. Gastrointestinal system: Abdomen is nondistended, soft and nontender. Central nervous system: Alert, but confused, moving all extremities (mild LLE weakness due to pain) Extremities: no LEE   Data Reviewed: I have personally reviewed following labs and imaging studies  CBC: Recent Labs  Lab 01/21/22 1115 01/21/22 1119 01/22/22 0044 01/24/22 0221 01/25/22 0111  WBC 5.2  --  4.2 6.2  --  NEUTROABS 3.2  --   --   --   --   HGB 9.2* 9.5* 9.1* 8.3* 8.2*  HCT 28.6* 28.0* 29.1* 26.0* 25.9*  MCV 95.0  --  94.2 94.5  --   PLT 218  --  227 184  --     Basic Metabolic Panel: Recent Labs  Lab 01/21/22 1115 01/21/22 1119 01/22/22 0044 01/24/22 0221 01/25/22 0111  NA 138 135 138 141 137  K 3.6 3.5 4.2 3.6 3.3*  CL 102 101 103 109 105  CO2 24  --  23 19* 22  GLUCOSE 118* 117* 136* 96 113*  BUN '19 19 17 15 14  '$ CREATININE 1.48* 1.50* 1.43* 1.53* 1.53*  CALCIUM 9.3  --  9.0 8.5* 8.1*    GFR: Estimated Creatinine Clearance: 29.4 mL/min (Deltha Bernales) (by C-G formula based on SCr of 1.53 mg/dL (H)).  Liver Function Tests: Recent Labs  Lab 01/21/22 1115 01/24/22 0221  AST 21 13*  ALT 14 11  ALKPHOS 71 58  BILITOT 0.5 0.9  PROT 5.8* 5.1*  ALBUMIN 2.9* 2.4*    CBG: Recent Labs  Lab 01/25/22 1809 01/25/22 2048  01/26/22 0606 01/26/22 1156 01/26/22 1646  GLUCAP 125* 154* 106* 99 101*     Recent Results (from the past 240 hour(s))  Resp Panel by RT-PCR (Flu Sante Biedermann&B, Covid) Anterior Nasal Swab     Status: None   Collection Time: 01/21/22 11:18 AM   Specimen: Anterior Nasal Swab  Result Value Ref Range Status   SARS Coronavirus 2 by RT PCR NEGATIVE NEGATIVE Final    Comment: (NOTE) SARS-CoV-2 target nucleic acids are NOT DETECTED.  The SARS-CoV-2 RNA is generally detectable in upper respiratory specimens during the acute phase of infection. The lowest concentration of SARS-CoV-2 viral copies this assay can detect is 138 copies/mL. Nijah Orlich negative result does not preclude SARS-Cov-2 infection and should not be used as the sole basis for treatment or other patient management decisions. Christiane Sistare negative result may occur with  improper specimen collection/handling, submission of specimen other than nasopharyngeal swab, presence of viral mutation(s) within the areas targeted by this assay, and inadequate number of viral copies(<138 copies/mL). Westin Knotts negative result must be combined with clinical observations, patient history, and epidemiological information. The expected result is Negative.  Fact Sheet for Patients:  EntrepreneurPulse.com.au  Fact Sheet for Healthcare Providers:  IncredibleEmployment.be  This test is no t yet approved or cleared by the [place] FDA and  has been authorized for detection and/or diagnosis of SARS-CoV-2 by FDA under an Emergency Use Authorization (EUA). This EUA will remain  in effect (meaning this test can be used) for the duration of the COVID-19 declaration under Section 564(b)(1) of the Act, 21 U.S.C.section 360bbb-3(b)(1), unless the authorization is terminated  or revoked sooner.       Influenza Irys Nigh by PCR NEGATIVE NEGATIVE Final   Influenza B by PCR NEGATIVE NEGATIVE Final    Comment: (NOTE) The Xpert Xpress SARS-CoV-2/FLU/RSV  plus assay is intended as an aid in the diagnosis of influenza from Nasopharyngeal swab specimens and should not be used as Jequan Shahin sole basis for treatment. Nasal washings and aspirates are unacceptable for Xpert Xpress SARS-CoV-2/FLU/RSV testing.  Fact Sheet for Patients: EntrepreneurPulse.com.au  Fact Sheet for Healthcare Providers: IncredibleEmployment.be  This test is not yet approved or cleared by the [place] FDA and has been authorized for detection and/or diagnosis of SARS-CoV-2 by FDA under an Emergency Use Authorization (EUA). This EUA will remain in effect (meaning  this test can be used) for the duration of the COVID-19 declaration under Section 564(b)(1) of the Act, 21 U.S.C. section 360bbb-3(b)(1), unless the authorization is terminated or revoked.  Performed at [facility], [place] [address], [place], [place] [address]   Urine Culture     Status: Abnormal   Collection Time: 01/21/22  2:14 PM   Specimen: Urine, Clean Catch  Result Value Ref Range Status   Specimen Description URINE, CLEAN CATCH  Final   Special Requests   Final    NONE Performed at [facility], [place] [address], [place], [place] [address]    Culture >=100,000 COLONIES/mL CITROBACTER AMALONATICUS (Kashmere Staffa)  Final   Report Status 01/23/2022 FINAL  Final   Organism ID, Bacteria CITROBACTER AMALONATICUS (Kaleel Schmieder)  Final      Susceptibility   Citrobacter amalonaticus - MIC*    CEFAZOLIN >=64 RESISTANT Resistant     CEFEPIME <=0.12 SENSITIVE Sensitive     CEFTRIAXONE 32 RESISTANT Resistant     CIPROFLOXACIN <=0.25 SENSITIVE Sensitive     GENTAMICIN <=1 SENSITIVE Sensitive     IMIPENEM <=0.25 SENSITIVE Sensitive     NITROFURANTOIN 32 SENSITIVE Sensitive     TRIMETH/SULFA <=20 SENSITIVE Sensitive     PIP/TAZO <=4 SENSITIVE Sensitive     * >=100,000 COLONIES/mL CITROBACTER AMALONATICUS  Culture, blood (Routine X 2) w Reflex to ID Panel     Status: None   Collection  Time: 01/21/22  2:27 PM   Specimen: BLOOD  Result Value Ref Range Status   Specimen Description BLOOD RIGHT ANTECUBITAL  Final   Special Requests   Final    BOTTLES DRAWN AEROBIC AND ANAEROBIC Blood Culture adequate volume   Culture   Final    NO GROWTH 5 DAYS Performed at [facility], [address] [place], [place] [address]    Report Status 01/26/2022 FINAL  Final  Culture, blood (Routine X 2) w Reflex to ID Panel     Status: None   Collection Time: 01/21/22  7:56 PM   Specimen: BLOOD  Result Value Ref Range Status   Specimen Description BLOOD RIGHT ANTECUBITAL  Final   Special Requests   Final    BOTTLES DRAWN AEROBIC AND ANAEROBIC Blood Culture adequate volume   Culture   Final    NO GROWTH 5 DAYS Performed at [facility], [place] [address] [place],  [address]    Report Status 01/26/2022 FINAL  Final         Radiology Studies: No results found.      Scheduled Meds:  apixaban  2.5 mg Oral BID   atorvastatin  20 mg Oral Daily   Chlorhexidine Gluconate Cloth  6 each Topical Daily   FLUoxetine  40 mg Oral q AM   insulin aspart  0-9 Units Subcutaneous TID WC   levothyroxine  75 mcg Oral QAC breakfast   metoprolol tartrate  25 mg Oral BID   nortriptyline  40 mg Oral QHS   OLANZapine  5 mg Oral QHS   sulfamethoxazole-trimethoprim  1 tablet Oral Q12H   tamsulosin  0.4 mg Oral Daily   torsemide  10 mg Oral Daily   traZODone  50 mg Oral QHS   Continuous Infusions:   LOS: 4 days    Time spent: over 30 min    Fayrene Helper, MD [facility]   To contact the attending provider between 7A-7P or the covering provider during after hours 7P-7A, please log into the  web site [email or web address] and access using universal [place] password for that web site. If you do not have the password, please call the hospital operator.  01/26/2022, 5:22 PM

## 2022-01-26 NOTE — Progress Notes (Signed)
Occupational Therapy Treatment Patient Details Name: Valerie Mcclure MRN: [id number] DOB: Jun 10, 1941 Today's Date: 01/26/2022   History of present illness 81 y/o female presents to [facility] 01/21/2022 with speech deficits and weakness. PT with chronic R weakness and baseline aphasia from prior CVA. Brain MRI negative. PMH includes L MCA CVA, HTN, osteopenia, afib, sacral wound, memory deficits, and anemia   OT comments  Patient supine in bed, agreeable to OT with daughter at side. Pt remains disoriented to place, requiring cueing to reorient herself; demonstrates difficulty following simple 1 step commands without multimodal cueing.  Transfers completed with mod assist, mobility with min-mod assist using RW and ADLs with min to max assist.  Cueing for forward weight shifting, balance and safety throughout session.  Daughter reports plan for mostly transfers at dc, using w/c for mobility and comfortable for dc home at this level of assist.  Will bring by handout for tub bench recommendations for family to decide on.      Recommendations for follow up therapy are one component of a multi-disciplinary discharge planning process, led by the attending physician.  Recommendations may be updated based on patient status, additional functional criteria and insurance authorization.    Follow Up Recommendations  Home health OT    Assistance Recommended at Discharge Frequent or constant Supervision/Assistance  Patient can return home with the following  A lot of help with bathing/dressing/bathroom;Assistance with cooking/housework;Direct supervision/assist for financial management;Assist for transportation;Help with stairs or ramp for entrance;Direct supervision/assist for medications management;A lot of help with walking and/or transfers   Equipment Recommendations  Tub/shower bench    Recommendations for Other Services      Precautions / Restrictions Precautions Precautions: Fall Precaution  Comments: R-sided coordination deficits, foley Restrictions Weight Bearing Restrictions: No       Mobility Bed Mobility Overal bed mobility: Needs Assistance Bed Mobility: Supine to Sit     Supine to sit: Mod assist     General bed mobility comments: trunk support and scooting foward, increased time required and cueing    Transfers Overall transfer level: Needs assistance Equipment used: Rolling walker (2 wheels) Transfers: Sit to/from Stand Sit to Stand: Mod assist           General transfer comment: to power up and steady due to heavy posterior and left lateral lean, poor sequencing with hand placement and coordination.     Balance Overall balance assessment: Needs assistance Sitting-balance support: No upper extremity supported, Feet supported Sitting balance-Leahy Scale: Fair Sitting balance - Comments: statically   Standing balance support: Bilateral upper extremity supported, During functional activity Standing balance-Leahy Scale: Poor Standing balance comment: relies on BUE and external support                           ADL either performed or assessed with clinical judgement   ADL Overall ADL's : Needs assistance/impaired Eating/Feeding: Minimal assistance;Sitting Eating/Feeding Details (indicate cue type and reason): min assist to correctly bring straw to mouth, apporx 50% accuracy. Grooming: Minimal assistance;Standing;Moderate assistance;Wash/dry hands;Oral care Grooming Details (indicate cue type and reason): initally completing washing hands at sink with cueing for sequencing and min-mod assist for balance, completing oral care sitting due to fatigue with min assist             Lower Body Dressing: Maximal assistance;Sit to/from stand   Toilet Transfer: Moderate assistance;Ambulation;Rolling walker (2 wheels) Toilet Transfer Details (indicate cue type and reason): simulated to chair Toileting- Clothing  Manipulation and Hygiene: Total  assistance;Sit to/from stand Toileting - Clothing Manipulation Details (indicate cue type and reason): incontinent of bowel, total asisst for hygiene while assisting to maintain balance     Functional mobility during ADLs: Moderate assistance;Minimal assistance;Rolling walker (2 wheels);Cueing for sequencing;Cueing for safety      Extremity/Trunk Assessment              Vision       Perception     Praxis Praxis Praxis: Impaired Praxis Impairment Details: Motor planning Praxis-Other Comments: at baseline    Cognition Arousal/Alertness: Awake/alert Behavior During Therapy: WFL for tasks assessed/performed Overall Cognitive Status: History of cognitive impairments - at baseline Area of Impairment: Orientation, Attention, Memory, Following commands, Safety/judgement, Awareness, Problem solving                 Orientation Level: Disoriented to, Place, Time, Situation Current Attention Level: Sustained Memory: Decreased short-term memory, Decreased recall of precautions Following Commands: Follows one step commands inconsistently, Follows one step commands with increased time, Follows multi-step commands inconsistently Safety/Judgement: Decreased awareness of safety, Decreased awareness of deficits Awareness: Intellectual Problem Solving: Slow processing, Decreased initiation, Difficulty sequencing, Requires verbal cues, Requires tactile cues General Comments: patient initally reports she is a "[employer]" when cued pt to look around she voiced "hopsital".  SHe has difficluty sequencing hand placement and positioning with transfers, sequencing during ADL tasks.  Poor awareness to safety and deficits.        Exercises      Shoulder Instructions       General Comments daughter present and reports comfortable with taking pt home at this level, typically completing transfers only to w/c. Educated on tub bench recommendations- will bring by handout with DME  rec.    Pertinent Vitals/ Pain       Pain Assessment Pain Assessment: No/denies pain  Home Living                                          Prior Functioning/Environment              Frequency  Min 2X/week        Progress Toward Goals  OT Goals(current goals can now be found in the care plan section)  Progress towards OT goals: Progressing toward goals  Acute Rehab OT Goals Patient Stated Goal: home OT Goal Formulation: With patient/family Time For Goal Achievement: 02/05/22 Potential to Achieve Goals: Good  Plan Discharge plan remains appropriate;Frequency remains appropriate    Co-evaluation                 AM-PAC OT "6 Clicks" Daily Activity     Outcome Measure   Help from another person eating meals?: A Little Help from another person taking care of personal grooming?: A Little Help from another person toileting, which includes using toliet, bedpan, or urinal?: Total Help from another person bathing (including washing, rinsing, drying)?: A Lot Help from another person to put on and taking off regular upper body clothing?: A Little Help from another person to put on and taking off regular lower body clothing?: A Lot 6 Click Score: 14    End of Session Equipment Utilized During Treatment: Gait belt;Rolling walker (2 wheels)  OT Visit Diagnosis: Other abnormalities of gait and mobility (R26.89);Muscle weakness (generalized) (M62.81);Other symptoms and signs involving cognitive function;Hemiplegia and hemiparesis Hemiplegia - Right/Left: Right  Hemiplegia - dominant/non-dominant: Dominant Hemiplegia - caused by: Cerebral infarction   Activity Tolerance Patient tolerated treatment well   Patient Left in chair;with call bell/phone within reach;Other (comment) (with PT)   Nurse Communication Mobility status        Time: [phone] OT Time Calculation (min): 27 min  Charges: OT General Charges $OT Visit: 1 Visit OT  Treatments $Self Care/Home Management : 23-37 mins  [place] Office [phone]   Delight Stare 01/26/2022, 10:23 AM

## 2022-01-26 NOTE — Plan of Care (Signed)
Mrs. Shankland is a/ox2, abel to express who she is and where she is at the beginning of shift. She did have a brief moment disorientation around 0340 upon waking up for AM VS and assessment, but easily reoriented to self and place. She denied any distress except a headache and subside w/ tylenol. Her daughter is at bedside. Will continue to monitor.  Problem: Education: Goal: Knowledge of General Education information will improve Description: Including pain rating scale, medication(s)/side effects and non-pharmacologic comfort measures Outcome: Not Progressing   Problem: Health Behavior/Discharge Planning: Goal: Ability to manage health-related needs will improve Outcome: Not Progressing   Problem: Clinical Measurements: Goal: Ability to maintain clinical measurements within normal limits will improve Outcome: Progressing Goal: Will remain free from infection Outcome: Progressing   Problem: Activity: Goal: Risk for activity intolerance will decrease Outcome: Not Progressing   Problem: Nutrition: Goal: Adequate nutrition will be maintained Outcome: Progressing   Problem: Coping: Goal: Level of anxiety will decrease Outcome: Progressing   Problem: Elimination: Goal: Will not experience complications related to bowel motility Outcome: Not Progressing Goal: Will not experience complications related to urinary retention Outcome: Not Progressing Note: Foley catheter remained   Problem: Pain Managment: Goal: General experience of comfort will improve Outcome: Progressing   Problem: Safety: Goal: Ability to remain free from injury will improve Outcome: Not Progressing Note: History of dementia, high risk for fall.   Problem: Skin Integrity: Goal: Risk for impaired skin integrity will decrease Outcome: Not Progressing

## 2022-01-26 NOTE — Assessment & Plan Note (Signed)
Foley inserted 7/7 - possibly related to UTI? Removed 7/10, will follow bladder scans - hopefully we can avoid indwelling foley at home for pt with delirium and UTI

## 2022-01-26 NOTE — Progress Notes (Signed)
Physical Therapy Treatment Patient Details Name: Valerie Mcclure MRN: [id number] DOB: 13-Jul-1941 Today's Date: 01/26/2022   History of Present Illness Pt is an 81 y/o female presents to [facility] hospital 01/21/2022 with speech deficits and weakness. PT with chronic R weakness and baseline aphasia from prior CVA. Brain MRI negative. PMH includes L MCA CVA, HTN, osteopenia, afib, sacral wound, memory deficits, and anemia    PT Comments    Pt progressing slowly towards physical therapy goals. Pt appeared fatigued after OT session and required increased assist for functional mobility. Pt and daughter motivated for return home. Daughter reports she is comfortable caring for pt at this level, and PTA daughter was transferring pt to/from a wheelchair at home. If family's comfort level changes, also feel SNF level rehab would be appropriate. However, it is reasonable for pt to return home if family can care for her at this functional level. Will continue to follow and progress as able per POC.    Recommendations for follow up therapy are one component of a multi-disciplinary discharge planning process, led by the attending physician.  Recommendations may be updated based on patient status, additional functional criteria and insurance authorization.  Follow Up Recommendations  Home health PT     Assistance Recommended at Discharge Frequent or constant Supervision/Assistance  Patient can return home with the following A lot of help with walking and/or transfers;A lot of help with bathing/dressing/bathroom;Assistance with cooking/housework;Direct supervision/assist for medications management;Direct supervision/assist for financial management;Assist for transportation;Help with stairs or ramp for entrance   Equipment Recommendations  BSC/3in1    Recommendations for Other Services       Precautions / Restrictions Precautions Precautions: Fall Precaution Comments: R-sided coordination deficits,  foley Restrictions Weight Bearing Restrictions: No     Mobility  Bed Mobility               General bed mobility comments: Pt was received sitting up in chair at the sink with OT present.    Transfers Overall transfer level: Needs assistance Equipment used: Rolling walker (2 wheels) Transfers: Sit to/from Stand Sit to Stand: +2 physical assistance, Mod assist           General transfer comment: Heavy assist required for power up to full stand from lower chair with arm rests. +2 optimal to assist with RUE and boost into standing.    Ambulation/Gait Ambulation/Gait assistance: Mod assist, +2 physical assistance, +2 safety/equipment (3rd person utilized for close chair follow.) Gait Distance (Feet): 50 Feet Assistive device: Rolling walker (2 wheels) Gait Pattern/deviations: Step-through pattern, Leaning posteriorly, Staggering left, Decreased step length - right, Ataxic Gait velocity: Decreased Gait velocity interpretation: <1.31 ft/sec, indicative of household ambulator   General Gait Details: R lateral lean and posterior lean throughout gait training. Pt required assist for balance support, walker management, and to maintain in middle of hallway. Pt appeared unaware of veering R throughout.   Stairs             Wheelchair Mobility    Modified Rankin (Stroke Patients Only)       Balance Overall balance assessment: Needs assistance Sitting-balance support: No upper extremity supported, Feet supported Sitting balance-Leahy Scale: Fair Sitting balance - Comments: statically Postural control: Posterior lean Standing balance support: Bilateral upper extremity supported, During functional activity Standing balance-Leahy Scale: Poor Standing balance comment: relies on BUE and external support  Cognition Arousal/Alertness: Awake/alert Behavior During Therapy: WFL for tasks assessed/performed Overall Cognitive Status:  History of cognitive impairments - at baseline Area of Impairment: Orientation, Attention, Memory, Following commands, Safety/judgement, Awareness, Problem solving                 Orientation Level: Disoriented to, Place, Time, Situation Current Attention Level: Sustained Memory: Decreased short-term memory, Decreased recall of precautions Following Commands: Follows one step commands inconsistently, Follows one step commands with increased time, Follows multi-step commands inconsistently Safety/Judgement: Decreased awareness of safety, Decreased awareness of deficits Awareness: Intellectual Problem Solving: Slow processing, Decreased initiation, Difficulty sequencing, Requires verbal cues, Requires tactile cues General Comments: patient initally reports she is a "[employer]" when cued pt to look around she voiced "hopsital".  SHe has difficluty sequencing hand placement and positioning with transfers, sequencing during ADL tasks.  Poor awareness to safety and deficits.        Exercises General Exercises - Lower Extremity Ankle Circles/Pumps: AROM, Both, Supine, 5 reps Long Arc Quad: Both, Seated, 5 reps Hip ABduction/ADduction: 5 reps, Seated (isometric adduction) Other Exercises Other Exercises: 5x sit to stand timed: 40 seconds    General Comments General comments (skin integrity, edema, etc.): daughter present and reports comfortable with taking pt home at this level, typically completing transfers only to w/c. Educated on tub bench recommendations- will bring by handout with DME rec.      Pertinent Vitals/Pain Pain Assessment Pain Assessment: Faces Faces Pain Scale: No hurt Pain Intervention(s): Monitored during session    Home Living                          Prior Function            PT Goals (current goals can now be found in the care plan section) Acute Rehab PT Goals Patient Stated Goal: to go home PT Goal Formulation: With patient Time  For Goal Achievement: 02/04/22 Potential to Achieve Goals: Fair Progress towards PT goals: Progressing toward goals    Frequency    Min 3X/week      PT Plan Current plan remains appropriate    Co-evaluation              AM-PAC PT "6 Clicks" Mobility   Outcome Measure  Help needed turning from your back to your side while in a flat bed without using bedrails?: A Little Help needed moving from lying on your back to sitting on the side of a flat bed without using bedrails?: A Little Help needed moving to and from a bed to a chair (including a wheelchair)?: A Lot Help needed standing up from a chair using your arms (e.g., wheelchair or bedside chair)?: A Lot Help needed to walk in hospital room?: Total Help needed climbing 3-5 steps with a railing? : Total 6 Click Score: 12    End of Session Equipment Utilized During Treatment: Gait belt Activity Tolerance: Patient tolerated treatment well Patient left: in bed;with call bell/phone within reach;with bed alarm set;with family/visitor present Nurse Communication: Mobility status PT Visit Diagnosis: Other abnormalities of gait and mobility (R26.89);Muscle weakness (generalized) (M62.81);Hemiplegia and hemiparesis Hemiplegia - Right/Left: Right Hemiplegia - dominant/non-dominant: Dominant Hemiplegia - caused by: Cerebral infarction (chronic from old L MCA CVA)     Time: 0712-1975 PT Time Calculation (min) (ACUTE ONLY): 22 min  Charges:  $Gait Training: 8-22 mins  Rolinda Roan, PT, DPT Acute Rehabilitation Services Secure Chat Preferred Office: [phone]    Thelma Comp 01/26/2022, 1:03 PM

## 2022-01-26 NOTE — Progress Notes (Signed)
Speech Language Pathology Treatment: Cognitive-Linquistic  Patient Details Name: Valerie Mcclure MRN: [id number] DOB: 1940-12-21 Today's Date: 01/26/2022 Time: 1351-1410 SLP Time Calculation (min) (ACUTE ONLY): 19 min  Assessment / Plan / Recommendation Clinical Impression  Pt seen for treatment of language function this date, daughter at bedside. Pt speaking in short halting phrases, marked by neologisms, semantic and phonemic paraphasias. Pt with difficulty expressing her name and she benefited from written cue across x2 trials. Confrontational naming task completed with 90% accuracy and responsive naming task with 60% accuracy provided min-mod sentence completion, phonemic and written cues. Provided education regarding supported communication strategies including: y/n questions to determine nature of pt's message, provide pt time to process/express, use of gestures and cueing to assist in verbal expression. Pt and daughter expressed understanding and agreement. SLP to continue f/u acutely.    HPI HPI: 81 y/o female presents to [facility] hospital 01/21/2022 with speech deficits and weakness. PT with chronic R weakness and baseline aphasia from prior CVA. Brain MRI negative. PMH includes L MCA CVA, HTN, osteopenia, afib, sacral wound., memory deficits, anemia      SLP Plan  Continue with current plan of care      Recommendations for follow up therapy are one component of a multi-disciplinary discharge planning process, led by the attending physician.  Recommendations may be updated based on patient status, additional functional criteria and insurance authorization.    Recommendations                   Oral Care Recommendations: Oral care BID Follow Up Recommendations: Home health SLP Assistance recommended at discharge: Frequent or constant Supervision/Assistance SLP Visit Diagnosis: Aphasia (R47.01) Plan: Continue with current plan of care            Ellwood Dense, [place],  [place] Office Number: [place]  01/26/2022, 2:14 PM

## 2022-01-26 NOTE — Assessment & Plan Note (Signed)
Persistent, follow

## 2022-01-26 NOTE — Care Management Important Message (Signed)
Important Message  Patient Details  Name: Valerie Mcclure MRN: [id number] Date of Birth: [birth year]   Medicare Important Message Given:  Yes     Orbie Pyo 01/26/2022, 3:24 PM

## 2022-01-26 NOTE — Progress Notes (Signed)
Assisted patient to [facility] moderate assist x 1.  Patient had large formed BM with small bright red blood noted.  No urine present at this time.  Patient became lethargic and slumped to the left side after passing the stool.  Patient now total assist back to bed.  VS rechecked and neuro checked.  Patient more alert at baseline once laying down.  Bladder scanned patient showed 100 ml.  Notified provider of incident.

## 2022-01-26 NOTE — Assessment & Plan Note (Addendum)
With hypothermia, hypotension, encephalopathy Bactrim per discussion with ID Blood cx NGx5 Urine cx with citrobacter resistant to cefazolin and ceftriaxone

## 2022-01-26 NOTE — Hospital Course (Signed)
Valerie Mcclure is an 81 y.o. female with Brandyn Thien history of CVA, residual right-sided weakness, dysarthria, HTN, HLD, chronic HFpEF, T2DM, stage IIIb CKD, atrial fibrillation who presented to the ED 7/5 with acute worsening of aphasia that day and dysuria beginning the prior evening at home.     CT head without acute abnormality, no LVO on CTA head/neck, and MR brain showed no acute intracranial pathology, unchanged remote left MCA distribution infarct.    She was hypothermic, and remained altered, found to have pyuria and bacteriuria for which ceftriaxone was started and urine culture sent.  Culture grew Citrobacter for which ID recommended bactrim.    7/7, recurrently retaining urine. Foley inserted.   On 7/8, her mental state has improved significantly, she is afebrile, though is not at her mobility baseline which makes discharge home at this time unsafe.   She had recurrent hypothermia and left sided weakness.  Repeat imaging was stable.  She's continued to gradually improve and plan is for discharge with outpatient follow up.  See below for additional details

## 2022-01-27 ENCOUNTER — Inpatient Hospital Stay (HOSPITAL_COMMUNITY): Payer: HMO

## 2022-01-27 DIAGNOSIS — R2990 Unspecified symptoms and signs involving the nervous system: Secondary | ICD-10-CM | POA: Diagnosis not present

## 2022-01-27 LAB — GLUCOSE, CAPILLARY
Glucose-Capillary: 104 mg/dL — ABNORMAL HIGH (ref 70–99)
Glucose-Capillary: 126 mg/dL — ABNORMAL HIGH (ref 70–99)
Glucose-Capillary: 142 mg/dL — ABNORMAL HIGH (ref 70–99)
Glucose-Capillary: 98 mg/dL (ref 70–99)
Glucose-Capillary: 196 mg/dL — ABNORMAL HIGH (ref 70–99)

## 2022-01-27 LAB — CBC
HCT: 26.6 % — ABNORMAL LOW (ref 36.0–46.0)
Hemoglobin: 8.4 g/dL — ABNORMAL LOW (ref 12.0–15.0)
MCH: 29.1 pg (ref 26.0–34.0)
MCHC: 31.6 g/dL (ref 30.0–36.0)
MCV: 92 fL (ref 80.0–100.0)
Platelets: 177 10*3/uL (ref 150–400)
RBC: 2.89 MIL/uL — ABNORMAL LOW (ref 3.87–5.11)
RDW: 15 % (ref 11.5–15.5)
WBC: 5.1 10*3/uL (ref 4.0–10.5)
nRBC: 0 % (ref 0.0–0.2)

## 2022-01-27 LAB — COMPREHENSIVE METABOLIC PANEL
ALT: 11 U/L (ref 0–44)
AST: 14 U/L — ABNORMAL LOW (ref 15–41)
Albumin: 2.4 g/dL — ABNORMAL LOW (ref 3.5–5.0)
Alkaline Phosphatase: 58 U/L (ref 38–126)
Anion gap: 10 (ref 5–15)
BUN: 14 mg/dL (ref 8–23)
CO2: 22 mmol/L (ref 22–32)
Calcium: 8.3 mg/dL — ABNORMAL LOW (ref 8.9–10.3)
Chloride: 102 mmol/L (ref 98–111)
Creatinine, Ser: 1.68 mg/dL — ABNORMAL HIGH (ref 0.44–1.00)
GFR, Estimated: 30 mL/min — ABNORMAL LOW (ref >60)
Glucose, Bld: 140 mg/dL — ABNORMAL HIGH (ref 70–99)
Potassium: 3.5 mmol/L (ref 3.5–5.1)
Sodium: 134 mmol/L — ABNORMAL LOW (ref 135–145)
Total Bilirubin: 0.5 mg/dL (ref 0.3–1.2)
Total Protein: 5.1 g/dL — ABNORMAL LOW (ref 6.5–8.1)

## 2022-01-27 LAB — CORTISOL: Cortisol, Plasma: 7.4 ug/dL

## 2022-01-27 LAB — PHOSPHORUS: Phosphorus: 3.1 mg/dL (ref 2.5–4.6)

## 2022-01-27 LAB — TSH: TSH: 9.936 u[IU]/mL — ABNORMAL HIGH (ref 0.350–4.500)

## 2022-01-27 LAB — MAGNESIUM: Magnesium: 1.5 mg/dL — ABNORMAL LOW (ref 1.7–2.4)

## 2022-01-27 LAB — PROCALCITONIN: Procalcitonin: <0.1 ng/mL

## 2022-01-27 MED ORDER — POLYETHYLENE GLYCOL 3350 17 G PO PACK
17.0000 g | PACK | Freq: Every day | ORAL | Status: DC
  Administered 2022-01-27 – 2022-01-28 (×2): 17 g via ORAL
  Filled 2022-01-27 (×3): qty 1

## 2022-01-27 MED ORDER — MAGNESIUM SULFATE 2 GM/50ML IV SOLN
2.0000 g | Freq: Once | INTRAVENOUS | Status: AC
Start: 2022-01-27 — End: 2022-01-27
  Administered 2022-01-27: 2 g via INTRAVENOUS
  Filled 2022-01-27: qty 50

## 2022-01-27 NOTE — Assessment & Plan Note (Signed)
Last night was having BM and became lethargic and slumped to L Suspect this was vagal event Daughter notes this has happened twice Valerie Mcclure month for 2-3 months Will start miralax daily to avoid straining Had recent echo 11/2021 with EF 65-70%, AI increased from prior study

## 2022-01-27 NOTE — Progress Notes (Signed)
PROGRESS NOTE    Valerie Mcclure  IOX:[id number] DOB: September 09, 1940 DOA: 01/21/2022 PCP: [name], MD  Chief Complaint  Patient presents with   Code Stroke    Brief Narrative:  Valerie Mcclure is an 81 y.o. female with Michio Thier history of CVA, residual right-sided weakness, dysarthria, HTN, HLD, chronic HFpEF, T2DM, stage IIIb CKD, atrial fibrillation who presented to the ED 7/5 with acute worsening of aphasia that day and dysuria beginning the prior evening at home.     CT head without acute abnormality, no LVO on CTA head/neck, and MR brain showed no acute intracranial pathology, unchanged remote left MCA distribution infarct.    She was hypothermic, and remained altered, found to have pyuria and bacteriuria for which ceftriaxone was started and urine culture sent.  Culture grew Citrobacter for which ID recommended bactrim.    7/7, recurrently retaining urine. Foley inserted.   On 7/8, her mental state has improved significantly, she is afebrile, though is not at her mobility baseline which makes discharge home at this time unsafe.     Assessment & Plan:   Principal Problem:   Stroke-like symptoms Active Problems:   Vaso vagal episode   Hypothermia   Sepsis secondary to UTI ([place])   Citrobacter infection   Spastic hemiplegia of right dominant side as late effect of cerebral infarction ([name])   SIRS (systemic inflammatory response syndrome) ([name])   Acute urinary retention   Persistent atrial fibrillation ([name])   Delirium   Chronic diastolic heart failure ([name])   Diabetes mellitus without complication ([name])   Depression   Hypertension   Hyperlipidemia   Hypothyroidism   Stage 3b chronic kidney disease (CKD) ([name])   Left knee pain   Anemia of chronic disease   Skin ulcer of sacrum ([name])   Acute metabolic encephalopathy   Assessment and Plan: * Stroke-like symptoms 81 year old female presenting with acute onset of left sided weakness and dysarthric speech with risk  factors for stroke including age, sex, previous CVA, HTN, HLD, family history  MRI without acute intracranial pathology, remote infarct with associated chronic blood products in L MCA distribution CTA without intracranial LVO, mild atherosclerotic disease in carotid bifurcation without flow limiting stenosis, 25% diameter stenosis proximal L ICA Echo was done recently (11/2021) and EF 65-70% (see report) Carotid US 11/2021 with 1-39%  Continue eliquis, statin.  Dr. [name] discussed with Dr. [name].  Overnight with vagal event and concern for L sided weakness -> head CT without acute abnormality.  Follow MRI brain.  Hypothermia Rectal temp 95.5 Unclear cause, bair hugger placed Follow cortisol, procalcitonin, TSH No leukocytosis Will continue to workup and follow   Vaso vagal episode Last night was having BM and became lethargic and slumped to L Suspect this was vagal event Daughter notes this has happened twice Dollye Glasser month for 2-3 months Will start miralax daily to avoid straining Had recent echo 11/2021 with EF 65-70%, AI increased from prior study  Sepsis secondary to UTI [facility]) With hypothermia, hypotension, encephalopathy Bactrim per discussion with ID Blood cx NGx5 Urine cx with citrobacter resistant to cefazolin and ceftriaxone  Acute urinary retention Foley inserted 7/7 - possibly related to UTI? Removed 7/10, recurrent retention.  Replaced 7/11.  Will need urology follow up outpatient.  Spastic hemiplegia of right dominant side as late effect of cerebral infarction ([name]) Right side appears at baseline.  Continue eliquis Continue therapy, likely will d/c home with home health  Delirium Persistent, follow   Persistent  atrial fibrillation ([name]) eliquis metoprolol  Chronic diastolic heart failure ([name]) Has chronic LE edema, but not overloaded on exam  Recent echo 5/23: showed EF 65-70% with indeterminate diastolic function and normal RV function. Moderate aortic  regurgitation.  Torsemide 10 mg daily  Depression Stable, continue prozac and pamelor qhs  Worsened in setting of husbands passing this year, grief Delirium precautions  Diabetes mellitus without complication ([name]) R6E of 5.8 SSI and accuchecks qac/hs   Hypertension Metoprolol, torsemide   Hyperlipidemia Continue lipitor, goal LDL <70 LDL 35  Hypothyroidism Synthroid at current dose Repeat TFT outpatient  Left knee pain X ray  Stage 3b chronic kidney disease (CKD) ([place]) At baseline of creatinine 1.4 stable Continue to monitor   Skin ulcer of sacrum ([name]) Chronic, no exposed skin Wound care consult     Pressure Injury 01/22/22 Sacrum Right Stage 2 -  Partial thickness loss of dermis presenting as Ravi Tuccillo shallow open injury with Rayanna Matusik red, pink wound bed without slough. (Active)  01/22/22 0820  Location: Sacrum  Location Orientation: Right  Staging: Stage 2 -  Partial thickness loss of dermis presenting as Linnae Rasool shallow open injury with Alicia Ackert red, pink wound bed without slough.  Wound Description (Comments):   Present on Admission: Yes      Anemia of chronic disease hgb 9.2 stable Continue to monitor       DVT prophylaxis: eliquis Code Status: DNR Family Communication: daughter over phone Disposition:   Status is: Inpatient Remains inpatient appropriate because: continued delirium, awaiting safe discharge plan   Consultants:  neurology  Procedures:  none  Antimicrobials:  Anti-infectives (From admission, onward)    Start     Dose/Rate Route Frequency Ordered Stop   01/24/22 1530  sulfamethoxazole-trimethoprim (BACTRIM) 400-80 MG per tablet 1 tablet        1 tablet Oral Every 12 hours 01/24/22 1436     01/24/22 0000  ceFEPIme (MAXIPIME) 2 g in sodium chloride 0.9 % 100 mL IVPB  Status:  Discontinued        2 g 200 mL/hr over 30 Minutes Intravenous Every 24 hours 01/23/22 2314 01/24/22 1436   01/24/22 0000  sulfamethoxazole-trimethoprim (BACTRIM) 400-80 MG  tablet        1 tablet Oral 2 times daily 01/24/22 1158 01/28/22 2359   01/23/22 1215  sulfamethoxazole-trimethoprim (BACTRIM) 400-80 MG per tablet 1 tablet  Status:  Discontinued        1 tablet Oral Every 12 hours 01/23/22 1118 01/23/22 2250   01/21/22 1700  cefTRIAXone (ROCEPHIN) 1 g in sodium chloride 0.9 % 100 mL IVPB  Status:  Discontinued        1 g 200 mL/hr over 30 Minutes Intravenous Every 24 hours 01/21/22 1646 01/23/22 1118       Subjective: No new complaints Discussed with daughter over phone  Objective: Vitals:   01/27/22 1739 01/27/22 1800 01/27/22 1830 01/27/22 1900  BP:      Pulse:      Resp:      Temp: (!) 96.7 F (35.9 C) (!) 97.1 F (36.2 C) (!) 97.1 F (36.2 C) 98.1 F (36.7 C)  TempSrc: Rectal Axillary Axillary Axillary  SpO2:        Intake/Output Summary (Last 24 hours) at 01/27/2022 1947 Last data filed at 01/27/2022 1821 Gross per 24 hour  Intake 720 ml  Output 660 ml  Net 60 ml   There were no vitals filed for this visit.  Examination:  General: No acute distress.  Cardiovascular: RRR Lungs: unlabored Neurological: remains difficult to understand, strength seems symmetric this AM Extremities: No clubbing or cyanosis. No edema.   Data Reviewed: I have personally reviewed following labs and imaging studies  CBC: Recent Labs  Lab 01/21/22 1115 01/21/22 1119 01/22/22 0044 01/24/22 0221 01/25/22 0111 01/27/22 0030  WBC 5.2  --  4.2 6.2  --  5.1  NEUTROABS 3.2  --   --   --   --   --   HGB 9.2* 9.5* 9.1* 8.3* 8.2* 8.4*  HCT 28.6* 28.0* 29.1* 26.0* 25.9* 26.6*  MCV 95.0  --  94.2 94.5  --  92.0  PLT 218  --  227 184  --  474    Basic Metabolic Panel: Recent Labs  Lab 01/21/22 1115 01/21/22 1119 01/22/22 0044 01/24/22 0221 01/25/22 0111 01/27/22 0030  NA 138 135 138 141 137 134*  K 3.6 3.5 4.2 3.6 3.3* 3.5  CL 102 101 103 109 105 102  CO2 24  --  23 19* 22 22  GLUCOSE 118* 117* 136* 96 113* 140*  BUN '19 19 17 15 14 14   '$ CREATININE 1.48* 1.50* 1.43* 1.53* 1.53* 1.68*  CALCIUM 9.3  --  9.0 8.5* 8.1* 8.3*  MG  --   --   --   --   --  1.5*  PHOS  --   --   --   --   --  3.1    GFR: Estimated Creatinine Clearance: 26.8 mL/min (Jamice Carreno) (by C-G formula based on SCr of 1.68 mg/dL (H)).  Liver Function Tests: Recent Labs  Lab 01/21/22 1115 01/24/22 0221 01/27/22 0030  AST 21 13* 14*  ALT '14 11 11  '$ ALKPHOS 71 58 58  BILITOT 0.5 0.9 0.5  PROT 5.8* 5.1* 5.1*  ALBUMIN 2.9* 2.4* 2.4*    CBG: Recent Labs  Lab 01/26/22 2121 01/27/22 0628 01/27/22 1212 01/27/22 1255 01/27/22 1651  GLUCAP 94 104* 126* 142* 98     Recent Results (from the past 240 hour(s))  Resp Panel by RT-PCR (Flu Chondra Boyde&B, Covid) Anterior Nasal Swab     Status: None   Collection Time: 01/21/22 11:18 AM   Specimen: Anterior Nasal Swab  Result Value Ref Range Status   SARS Coronavirus 2 by RT PCR NEGATIVE NEGATIVE Final    Comment: (NOTE) SARS-CoV-2 target nucleic acids are NOT DETECTED.  The SARS-CoV-2 RNA is generally detectable in upper respiratory specimens during the acute phase of infection. The lowest concentration of SARS-CoV-2 viral copies this assay can detect is 138 copies/mL. Cortlan Dolin negative result does not preclude SARS-Cov-2 infection and should not be used as the sole basis for treatment or other patient management decisions. Sampson Self negative result may occur with  improper specimen collection/handling, submission of specimen other than nasopharyngeal swab, presence of viral mutation(s) within the areas targeted by this assay, and inadequate number of viral copies(<138 copies/mL). Theodis Kinsel negative result must be combined with clinical observations, patient history, and epidemiological information. The expected result is Negative.  Fact Sheet for Patients:  EntrepreneurPulse.com.au  Fact Sheet for Healthcare Providers:  IncredibleEmployment.be  This test is no t yet approved or cleared by the  [place] FDA and  has been authorized for detection and/or diagnosis of SARS-CoV-2 by FDA under an Emergency Use Authorization (EUA). This EUA will remain  in effect (meaning this test can be used) for the duration of the COVID-19 declaration under Section 564(b)(1) of the Act, 21 U.S.C.section 360bbb-3(b)(1), unless the authorization is  terminated  or revoked sooner.       Influenza Kaitlyn Franko by PCR NEGATIVE NEGATIVE Final   Influenza B by PCR NEGATIVE NEGATIVE Final    Comment: (NOTE) The Xpert Xpress SARS-CoV-2/FLU/RSV plus assay is intended as an aid in the diagnosis of influenza from Nasopharyngeal swab specimens and should not be used as Rafan Sanders sole basis for treatment. Nasal washings and aspirates are unacceptable for Xpert Xpress SARS-CoV-2/FLU/RSV testing.  Fact Sheet for Patients: EntrepreneurPulse.com.au  Fact Sheet for Healthcare Providers: IncredibleEmployment.be  This test is not yet approved or cleared by the [place] FDA and has been authorized for detection and/or diagnosis of SARS-CoV-2 by FDA under an Emergency Use Authorization (EUA). This EUA will remain in effect (meaning this test can be used) for the duration of the COVID-19 declaration under Section 564(b)(1) of the Act, 21 U.S.C. section 360bbb-3(b)(1), unless the authorization is terminated or revoked.  Performed at [facility], [place] [address], [place], [place] [address]   Urine Culture     Status: Abnormal   Collection Time: 01/21/22  2:14 PM   Specimen: Urine, Clean Catch  Result Value Ref Range Status   Specimen Description URINE, CLEAN CATCH  Final   Special Requests   Final    NONE Performed at [facility], [place] [address] [place], [place] [address]    Culture >=100,000 COLONIES/mL CITROBACTER AMALONATICUS (Nida Manfredi)  Final   Report Status 01/23/2022 FINAL  Final   Organism ID, Bacteria CITROBACTER AMALONATICUS (Kassi Esteve)  Final      Susceptibility    Citrobacter amalonaticus - MIC*    CEFAZOLIN >=64 RESISTANT Resistant     CEFEPIME <=0.12 SENSITIVE Sensitive     CEFTRIAXONE 32 RESISTANT Resistant     CIPROFLOXACIN <=0.25 SENSITIVE Sensitive     GENTAMICIN <=1 SENSITIVE Sensitive     IMIPENEM <=0.25 SENSITIVE Sensitive     NITROFURANTOIN 32 SENSITIVE Sensitive     TRIMETH/SULFA <=20 SENSITIVE Sensitive     PIP/TAZO <=4 SENSITIVE Sensitive     * >=100,000 COLONIES/mL CITROBACTER AMALONATICUS  Culture, blood (Routine X 2) w Reflex to ID Panel     Status: None   Collection Time: 01/21/22  2:27 PM   Specimen: BLOOD  Result Value Ref Range Status   Specimen Description BLOOD RIGHT ANTECUBITAL  Final   Special Requests   Final    BOTTLES DRAWN AEROBIC AND ANAEROBIC Blood Culture adequate volume   Culture   Final    NO GROWTH 5 DAYS Performed at [facility], [address], [place], [place] [address]    Report Status 01/26/2022 FINAL  Final  Culture, blood (Routine X 2) w Reflex to ID Panel     Status: None   Collection Time: 01/21/22  7:56 PM   Specimen: BLOOD  Result Value Ref Range Status   Specimen Description BLOOD RIGHT ANTECUBITAL  Final   Special Requests   Final    BOTTLES DRAWN AEROBIC AND ANAEROBIC Blood Culture adequate volume   Culture   Final    NO GROWTH 5 DAYS Performed at [facility], [place] [address], [place],  [address]    Report Status 01/26/2022 FINAL  Final         Radiology Studies: CT HEAD WO CONTRAST (5MM)  Result Date: 01/27/2022 CLINICAL DATA:  Neuro deficit, acute, stroke suspected EXAM: CT HEAD WITHOUT CONTRAST TECHNIQUE: Contiguous axial images were obtained from the base of the skull through the vertex without intravenous contrast.  RADIATION DOSE REDUCTION: This exam was performed according to the departmental dose-optimization program which includes automated exposure control, adjustment of the mA and/or kV according to patient size and/or use of iterative reconstruction  technique. COMPARISON:  01/21/2022 FINDINGS: Brain: Old large left basal ganglia/internal capsule infarct. Old left temporal lobe infarct. These findings are unchanged since prior study. Ex vacuo dilatation of the left lateral ventricle. No acute infarction, hemorrhage or hydrocephalus. Vascular: No hyperdense vessel or unexpected calcification. Skull: No acute calvarial abnormality. Sinuses/Orbits: No acute findings Other: None IMPRESSION: Old left MCA infarct. No acute intracranial abnormality. Electronically Signed   By: Rolm Baptise M.D.   On: 01/27/2022 01:19   DG CHEST PORT 1 VIEW  Result Date: 01/27/2022 CLINICAL DATA:  Crackling in chest. EXAM: PORTABLE CHEST 1 VIEW COMPARISON:  Radiograph 01/21/2022. FINDINGS: Mild cardiomegaly, similar. Aortic atherosclerosis. Slightly indistinct left hemidiaphragm may represent atelectasis or effusion. No pulmonary edema. No pneumothorax. No confluent airspace disease. IMPRESSION: 1. Mild cardiomegaly. 2. Slightly indistinct left hemidiaphragm may represent atelectasis or effusion. Electronically Signed   By: Keith Rake M.D.   On: 01/27/2022 00:02   DG Knee 1-2 Views Left  Result Date: 01/26/2022 CLINICAL DATA:  Left knee pain EXAM: LEFT KNEE - 1-2 VIEW COMPARISON:  08/03/2012 FINDINGS: Moderate tricompartment degenerative changes with joint space narrowing and spurring, progressed since prior study. No acute bony abnormality. Specifically, no fracture, subluxation, or dislocation. No joint effusion. Soft tissues are intact. IMPRESSION: Progressive moderate tricompartment degenerative changes. No acute bony abnormality. Electronically Signed   By: Rolm Baptise M.D.   On: 01/26/2022 19:14        Scheduled Meds:  atorvastatin  20 mg Oral Daily   Chlorhexidine Gluconate Cloth  6 each Topical Daily   FLUoxetine  40 mg Oral q AM   insulin aspart  0-9 Units Subcutaneous TID WC   levothyroxine  75 mcg Oral QAC breakfast   nortriptyline  40 mg Oral QHS    OLANZapine  5 mg Oral QHS   polyethylene glycol  17 g Oral Daily   sulfamethoxazole-trimethoprim  1 tablet Oral Q12H   tamsulosin  0.4 mg Oral Daily   traZODone  50 mg Oral QHS   Continuous Infusions:   LOS: 5 days    Time spent: over 30 min    Fayrene Helper, MD [facility]   To contact the attending provider between 7A-7P or the covering provider during after hours 7P-7A, please log into the web site [email or web address] and access using universal [place] password for that web site. If you do not have the password, please call the hospital operator.  01/27/2022, 7:47 PM

## 2022-01-27 NOTE — Assessment & Plan Note (Signed)
Rectal temp 95.5 Unclear cause, bair hugger placed Follow cortisol, procalcitonin, TSH No leukocytosis Will continue to workup and follow

## 2022-01-28 ENCOUNTER — Inpatient Hospital Stay (HOSPITAL_COMMUNITY): Payer: HMO

## 2022-01-28 DIAGNOSIS — R2990 Unspecified symptoms and signs involving the nervous system: Secondary | ICD-10-CM | POA: Diagnosis not present

## 2022-01-28 LAB — COMPREHENSIVE METABOLIC PANEL
ALT: 11 U/L (ref 0–44)
AST: 15 U/L (ref 15–41)
Albumin: 2.3 g/dL — ABNORMAL LOW (ref 3.5–5.0)
Alkaline Phosphatase: 60 U/L (ref 38–126)
Anion gap: 11 (ref 5–15)
BUN: 14 mg/dL (ref 8–23)
CO2: 23 mmol/L (ref 22–32)
Calcium: 8.5 mg/dL — ABNORMAL LOW (ref 8.9–10.3)
Chloride: 102 mmol/L (ref 98–111)
Creatinine, Ser: 1.56 mg/dL — ABNORMAL HIGH (ref 0.44–1.00)
GFR, Estimated: 33 mL/min — ABNORMAL LOW (ref >60)
Glucose, Bld: 94 mg/dL (ref 70–99)
Potassium: 4.2 mmol/L (ref 3.5–5.1)
Sodium: 136 mmol/L (ref 135–145)
Total Bilirubin: 0.3 mg/dL (ref 0.3–1.2)
Total Protein: 5.1 g/dL — ABNORMAL LOW (ref 6.5–8.1)

## 2022-01-28 LAB — CBC WITH DIFFERENTIAL/PLATELET
Abs Immature Granulocytes: 0.02 10*3/uL (ref 0.00–0.07)
Basophils Absolute: 0 10*3/uL (ref 0.0–0.1)
Basophils Relative: 1 %
Eosinophils Absolute: 0.2 10*3/uL (ref 0.0–0.5)
Eosinophils Relative: 4 %
HCT: 27.2 % — ABNORMAL LOW (ref 36.0–46.0)
Hemoglobin: 8.7 g/dL — ABNORMAL LOW (ref 12.0–15.0)
Immature Granulocytes: 1 %
Lymphocytes Relative: 32 %
Lymphs Abs: 1.4 10*3/uL (ref 0.7–4.0)
MCH: 29.6 pg (ref 26.0–34.0)
MCHC: 32 g/dL (ref 30.0–36.0)
MCV: 92.5 fL (ref 80.0–100.0)
Monocytes Absolute: 0.5 10*3/uL (ref 0.1–1.0)
Monocytes Relative: 11 %
Neutro Abs: 2.3 10*3/uL (ref 1.7–7.7)
Neutrophils Relative %: 51 %
Platelets: 187 10*3/uL (ref 150–400)
RBC: 2.94 MIL/uL — ABNORMAL LOW (ref 3.87–5.11)
RDW: 14.9 % (ref 11.5–15.5)
WBC: 4.4 10*3/uL (ref 4.0–10.5)
nRBC: 0 % (ref 0.0–0.2)

## 2022-01-28 LAB — GLUCOSE, CAPILLARY
Glucose-Capillary: 106 mg/dL — ABNORMAL HIGH (ref 70–99)
Glucose-Capillary: 107 mg/dL — ABNORMAL HIGH (ref 70–99)
Glucose-Capillary: 141 mg/dL — ABNORMAL HIGH (ref 70–99)
Glucose-Capillary: 189 mg/dL — ABNORMAL HIGH (ref 70–99)

## 2022-01-28 LAB — CORTISOL: Cortisol, Plasma: 7.3 ug/dL

## 2022-01-28 LAB — PROCALCITONIN: Procalcitonin: <0.1 ng/mL

## 2022-01-28 LAB — T4, FREE: Free T4: 1.2 ng/dL — ABNORMAL HIGH (ref 0.61–1.12)

## 2022-01-28 MED ORDER — ALPRAZOLAM 0.25 MG PO TABS
0.2500 mg | ORAL_TABLET | Freq: Once | ORAL | Status: AC | PRN
  Administered 2022-01-28: 0.25 mg via ORAL
  Filled 2022-01-28: qty 1

## 2022-01-28 NOTE — Progress Notes (Signed)
PROGRESS NOTE    Valerie Mcclure  ZHY:[id number] DOB: [birth year] DOA: 01/21/2022 PCP: [name], MD  Chief Complaint  Patient presents with   Code Stroke    Brief Narrative:  Valerie Mcclure is an 81 y.o. female with Valerie Mcclure history of CVA, residual right-sided weakness, dysarthria, HTN, HLD, chronic HFpEF, T2DM, stage IIIb CKD, atrial fibrillation who presented to the ED 7/5 with acute worsening of aphasia that day and dysuria beginning the prior evening at home.     CT head without acute abnormality, no LVO on CTA head/neck, and MR brain showed no acute intracranial pathology, unchanged remote left MCA distribution infarct.    She was hypothermic, and remained altered, found to have pyuria and bacteriuria for which ceftriaxone was started and urine culture sent.  Culture grew Citrobacter for which ID recommended bactrim.    7/7, recurrently retaining urine. Foley inserted.   On 7/8, her mental state has improved significantly, she is afebrile, though is not at her mobility baseline which makes discharge home at this time unsafe.     Assessment & Plan:   Principal Problem:   Stroke-like symptoms Active Problems:   Vaso vagal episode   Hypothermia   Sepsis secondary to UTI (Valerie Mcclure)   Citrobacter infection   Spastic hemiplegia of right dominant side as late effect of cerebral infarction ([name])   SIRS (systemic inflammatory response syndrome) ([name])   Acute urinary retention   Persistent atrial fibrillation ([name])   Delirium   Chronic diastolic heart failure ([name])   Diabetes mellitus without complication ([name])   Depression   Hypertension   Hyperlipidemia   Hypothyroidism   Stage 3b chronic kidney disease (CKD) ([name])   Left knee pain   Anemia of chronic disease   Skin ulcer of sacrum ([name])   Acute metabolic encephalopathy   Assessment and Plan: * Stroke-like symptoms 81 year old female presenting with acute onset of left sided weakness and dysarthric speech with risk  factors for stroke including age, sex, previous CVA, HTN, HLD, family history  MRI without acute intracranial pathology, remote infarct with associated chronic blood products in L MCA distribution CTA without intracranial LVO, mild atherosclerotic disease in carotid bifurcation without flow limiting stenosis, 25% diameter stenosis proximal L ICA Echo was done recently (11/2021) and EF 65-70% (see report) Carotid US 11/2021 with 1-39%  Continue eliquis, statin.  Dr. [name] discussed with Dr. [name].  Overnight with vagal event and concern for L sided weakness -> head CT without acute abnormality.  Follow MRI brain -> without acute abnormality  Hypothermia Rectal temp 95.5 on 7/11 Improved today Unclear cause Follow cortisol wnl, follow acth stim procalcitonin wnl TSH 9.9, needs outpatient follow up No leukocytosis Continue to monitor, follow up   Vaso vagal episode Last night was having BM and became lethargic and slumped to L Suspect this was vagal event Daughter notes this has happened twice Valerie Mcclure month for 2-3 months Will start miralax daily to avoid straining Had recent echo 11/2021 with EF 65-70%, AI increased from prior study  Sepsis secondary to UTI [facility]) With hypothermia, hypotension, encephalopathy Bactrim per discussion with ID Blood cx NGx5 Urine cx with citrobacter resistant to cefazolin and ceftriaxone  Acute urinary retention Foley inserted 7/7 - possibly related to UTI? Removed 7/10, recurrent retention.  Replaced 7/11.  Will need urology follow up outpatient.  Spastic hemiplegia of right dominant side as late effect of cerebral infarction ([name]) Right side appears at baseline.  Continue eliquis Continue therapy,  likely will d/c home with home health  Delirium Persistent, follow   Persistent atrial fibrillation ([name]) eliquis metoprolol  Chronic diastolic heart failure ([name]) Has chronic LE edema, but not overloaded on exam  Recent echo 5/23: showed EF 65-70% with  indeterminate diastolic function and normal RV function. Moderate aortic regurgitation.  Torsemide 10 mg daily  Depression Stable, continue prozac and pamelor qhs  Worsened in setting of husbands passing this year, grief Delirium precautions  Diabetes mellitus without complication ([name]) J6B of 5.8 SSI and accuchecks qac/hs   Hypertension Metoprolol, torsemide   Hyperlipidemia Continue lipitor, goal LDL <70 LDL 35  Hypothyroidism Synthroid at current dose Elevated tsh, needs outpatient follow up  Left knee pain X ray  Stage 3b chronic kidney disease (CKD) ([name]) At baseline of creatinine 1.4 stable Continue to monitor   Skin ulcer of sacrum ([name])    Pressure Injury 01/22/22 Sacrum Right Stage 2 -  Partial thickness loss of dermis presenting as Valerie Mcclure shallow open injury with Valerie Mcclure red, pink wound bed without slough. (Active)  01/22/22 0820  Location: Sacrum  Location Orientation: Right  Staging: Stage 2 -  Partial thickness loss of dermis presenting as Valerie Mcclure shallow open injury with Valerie Mcclure red, pink wound bed without slough.  Wound Description (Comments):   Present on Admission: Yes      Anemia of chronic disease hgb stable Continue to monitor       DVT prophylaxis: eliquis Code Status: DNR Family Communication: daughter, son over phone Disposition:   Status is: Inpatient Remains inpatient appropriate because: continued delirium, awaiting safe discharge plan   Consultants:  neurology  Procedures:  none  Antimicrobials:  Anti-infectives (From admission, onward)    Start     Dose/Rate Route Frequency Ordered Stop   01/24/22 1530  sulfamethoxazole-trimethoprim (BACTRIM) 400-80 MG per tablet 1 tablet        1 tablet Oral Every 12 hours 01/24/22 1436     01/24/22 0000  ceFEPIme (MAXIPIME) 2 g in sodium chloride 0.9 % 100 mL IVPB  Status:  Discontinued        2 g 200 mL/hr over 30 Minutes Intravenous Every 24 hours 01/23/22 2314 01/24/22 1436   01/24/22 0000   sulfamethoxazole-trimethoprim (BACTRIM) 400-80 MG tablet        1 tablet Oral 2 times daily 01/24/22 1158 01/28/22 2359   01/23/22 1215  sulfamethoxazole-trimethoprim (BACTRIM) 400-80 MG per tablet 1 tablet  Status:  Discontinued        1 tablet Oral Every 12 hours 01/23/22 1118 01/23/22 2250   01/21/22 1700  cefTRIAXone (ROCEPHIN) 1 g in sodium chloride 0.9 % 100 mL IVPB  Status:  Discontinued        1 g 200 mL/hr over 30 Minutes Intravenous Every 24 hours 01/21/22 1646 01/23/22 1118       Subjective: No complaints Discussed with daughter and son (over phone)  Objective: Vitals:   01/28/22 0414 01/28/22 0752 01/28/22 1059 01/28/22 1529  BP: (!) 101/42 (!) 113/42 (!) 122/48 (!) 107/41  Pulse: 65 66 73   Resp: '16 18 18 16  '$ Temp: 98.2 F (36.8 C) 97.9 F (36.6 C) 98.3 F (36.8 C) 98 F (36.7 C)  TempSrc: Oral Oral    SpO2: 95% 96% 96% 98%    Intake/Output Summary (Last 24 hours) at 01/28/2022 1832 Last data filed at 01/28/2022 1723 Gross per 24 hour  Intake 120 ml  Output 850 ml  Net -730 ml   There were no vitals  filed for this visit.  Examination:  General: No acute distress. Cardiovascular: RRR Lungs: unlabored Neurological: moving all extremities, difficult to understand Extremities: No clubbing or cyanosis. No edema.   Data Reviewed: I have personally reviewed following labs and imaging studies  CBC: Recent Labs  Lab 01/22/22 0044 01/24/22 0221 01/25/22 0111 01/27/22 0030 01/28/22 0702  WBC 4.2 6.2  --  5.1 4.4  NEUTROABS  --   --   --   --  2.3  HGB 9.1* 8.3* 8.2* 8.4* 8.7*  HCT 29.1* 26.0* 25.9* 26.6* 27.2*  MCV 94.2 94.5  --  92.0 92.5  PLT 227 184  --  177 924    Basic Metabolic Panel: Recent Labs  Lab 01/22/22 0044 01/24/22 0221 01/25/22 0111 01/27/22 0030 01/28/22 0702  NA 138 141 137 134* 136  K 4.2 3.6 3.3* 3.5 4.2  CL 103 109 105 102 102  CO2 23 19* '22 22 23  '$ GLUCOSE 136* 96 113* 140* 94  BUN '17 15 14 14 14  '$ CREATININE 1.43*  1.53* 1.53* 1.68* 1.56*  CALCIUM 9.0 8.5* 8.1* 8.3* 8.5*  MG  --   --   --  1.5*  --   PHOS  --   --   --  3.1  --     GFR: Estimated Creatinine Clearance: 28.8 mL/min (Ernest Orr) (by C-G formula based on SCr of 1.56 mg/dL (H)).  Liver Function Tests: Recent Labs  Lab 01/24/22 0221 01/27/22 0030 01/28/22 0702  AST 13* 14* 15  ALT '11 11 11  '$ ALKPHOS 58 58 60  BILITOT 0.9 0.5 0.3  PROT 5.1* 5.1* 5.1*  ALBUMIN 2.4* 2.4* 2.3*    CBG: Recent Labs  Lab 01/27/22 1651 01/27/22 2153 01/28/22 0614 01/28/22 1208 01/28/22 1720  GLUCAP 98 196* 106* 107* 141*     Recent Results (from the past 240 hour(s))  Resp Panel by RT-PCR (Flu Taeko Schaffer&B, Covid) Anterior Nasal Swab     Status: None   Collection Time: 01/21/22 11:18 AM   Specimen: Anterior Nasal Swab  Result Value Ref Range Status   SARS Coronavirus 2 by RT PCR NEGATIVE NEGATIVE Final    Comment: (NOTE) SARS-CoV-2 target nucleic acids are NOT DETECTED.  The SARS-CoV-2 RNA is generally detectable in upper respiratory specimens during the acute phase of infection. The lowest concentration of SARS-CoV-2 viral copies this assay can detect is 138 copies/mL. Jessamine Barcia negative result does not preclude SARS-Cov-2 infection and should not be used as the sole basis for treatment or other patient management decisions. Habiba Treloar negative result may occur with  improper specimen collection/handling, submission of specimen other than nasopharyngeal swab, presence of viral mutation(s) within the areas targeted by this assay, and inadequate number of viral copies(<138 copies/mL). Ziaire Hagos negative result must be combined with clinical observations, patient history, and epidemiological information. The expected result is Negative.  Fact Sheet for Patients:  EntrepreneurPulse.com.au  Fact Sheet for Healthcare Providers:  IncredibleEmployment.be  This test is no t yet approved or cleared by the [place] FDA and  has been  authorized for detection and/or diagnosis of SARS-CoV-2 by FDA under an Emergency Use Authorization (EUA). This EUA will remain  in effect (meaning this test can be used) for the duration of the COVID-19 declaration under Section 564(b)(1) of the Act, 21 U.S.C.section 360bbb-3(b)(1), unless the authorization is terminated  or revoked sooner.       Influenza Dynisha Due by PCR NEGATIVE NEGATIVE Final   Influenza B by PCR NEGATIVE NEGATIVE Final  Comment: (NOTE) The Xpert Xpress SARS-CoV-2/FLU/RSV plus assay is intended as an aid in the diagnosis of influenza from Nasopharyngeal swab specimens and should not be used as Jaime Grizzell sole basis for treatment. Nasal washings and aspirates are unacceptable for Xpert Xpress SARS-CoV-2/FLU/RSV testing.  Fact Sheet for Patients: EntrepreneurPulse.com.au  Fact Sheet for Healthcare Providers: IncredibleEmployment.be  This test is not yet approved or cleared by the [place] FDA and has been authorized for detection and/or diagnosis of SARS-CoV-2 by FDA under an Emergency Use Authorization (EUA). This EUA will remain in effect (meaning this test can be used) for the duration of the COVID-19 declaration under Section 564(b)(1) of the Act, 21 U.S.C. section 360bbb-3(b)(1), unless the authorization is terminated or revoked.  Performed at [facility], [place] [address] [place], [place] [address]   Urine Culture     Status: Abnormal   Collection Time: 01/21/22  2:14 PM   Specimen: Urine, Clean Catch  Result Value Ref Range Status   Specimen Description URINE, CLEAN CATCH  Final   Special Requests   Final    NONE Performed at [facility], [place] [address], [place], [place] [address]    Culture >=100,000 COLONIES/mL CITROBACTER AMALONATICUS (Farris Geiman)  Final   Report Status 01/23/2022 FINAL  Final   Organism ID, Bacteria CITROBACTER AMALONATICUS (Pheonix Clinkscale)  Final      Susceptibility   Citrobacter amalonaticus - MIC*     CEFAZOLIN >=64 RESISTANT Resistant     CEFEPIME <=0.12 SENSITIVE Sensitive     CEFTRIAXONE 32 RESISTANT Resistant     CIPROFLOXACIN <=0.25 SENSITIVE Sensitive     GENTAMICIN <=1 SENSITIVE Sensitive     IMIPENEM <=0.25 SENSITIVE Sensitive     NITROFURANTOIN 32 SENSITIVE Sensitive     TRIMETH/SULFA <=20 SENSITIVE Sensitive     PIP/TAZO <=4 SENSITIVE Sensitive     * >=100,000 COLONIES/mL CITROBACTER AMALONATICUS  Culture, blood (Routine X 2) w Reflex to ID Panel     Status: None   Collection Time: 01/21/22  2:27 PM   Specimen: BLOOD  Result Value Ref Range Status   Specimen Description BLOOD RIGHT ANTECUBITAL  Final   Special Requests   Final    BOTTLES DRAWN AEROBIC AND ANAEROBIC Blood Culture adequate volume   Culture   Final    NO GROWTH 5 DAYS Performed at [facility], [address], [place], [place] [address]    Report Status 01/26/2022 FINAL  Final  Culture, blood (Routine X 2) w Reflex to ID Panel     Status: None   Collection Time: 01/21/22  7:56 PM   Specimen: BLOOD  Result Value Ref Range Status   Specimen Description BLOOD RIGHT ANTECUBITAL  Final   Special Requests   Final    BOTTLES DRAWN AEROBIC AND ANAEROBIC Blood Culture adequate volume   Culture   Final    NO GROWTH 5 DAYS Performed at [facility], [place] [address] [place],  [address]    Report Status 01/26/2022 FINAL  Final         Radiology Studies: MR BRAIN WO CONTRAST  Result Date: 01/28/2022 CLINICAL DATA:  Neuro deficit, acute, stroke suspected. EXAM: MRI HEAD WITHOUT CONTRAST TECHNIQUE: Multiplanar, multiecho pulse sequences of the brain and surrounding structures were obtained without intravenous contrast. COMPARISON:  Head CT January 27, 2022; MRI of the brain January 21, 2022. FINDINGS: Brain: No acute infarction, hemorrhage, hydrocephalus, extra-axial collection or mass lesion. Area of encephalomalacia and gliosis with hemosiderin  deposit involving the left temporal lobe, insular  region, basal ganglia, anterior left thalamus and internal capsule consistent with known left MCA territory infarct. Decreased volume of the left thalamus, left cerebral peduncle and pons related to wallerian degeneration. Minimal white matter disease supratentorially, moderate within the pons, unchanged Vascular: Normal flow voids. Skull and upper cervical spine: Normal marrow signal. Sinuses/Orbits: Negative. Other: Kaveh Kissinger 1.4 cm right parotid lesion, unchanged when compared to recent MRI. IMPRESSION: 1. No acute intracranial abnormality. 2. Stable appearance of left MCA territory infarct with associated wallerian degeneration. 3. Right parotid lesion with slow growth since 2018, unchanged since most recent MR. Electronically Signed   By: Pedro Earls M.D.   On: 01/28/2022 13:19   CT HEAD WO CONTRAST (5MM)  Result Date: 01/27/2022 CLINICAL DATA:  Neuro deficit, acute, stroke suspected EXAM: CT HEAD WITHOUT CONTRAST TECHNIQUE: Contiguous axial images were obtained from the base of the skull through the vertex without intravenous contrast. RADIATION DOSE REDUCTION: This exam was performed according to the departmental dose-optimization program which includes automated exposure control, adjustment of the mA and/or kV according to patient size and/or use of iterative reconstruction technique. COMPARISON:  01/21/2022 FINDINGS: Brain: Old large left basal ganglia/internal capsule infarct. Old left temporal lobe infarct. These findings are unchanged since prior study. Ex vacuo dilatation of the left lateral ventricle. No acute infarction, hemorrhage or hydrocephalus. Vascular: No hyperdense vessel or unexpected calcification. Skull: No acute calvarial abnormality. Sinuses/Orbits: No acute findings Other: None IMPRESSION: Old left MCA infarct. No acute intracranial abnormality. Electronically Signed   By: Rolm Baptise M.D.   On: 01/27/2022 01:19   DG CHEST PORT 1 VIEW  Result Date: 01/27/2022 CLINICAL  DATA:  Crackling in chest. EXAM: PORTABLE CHEST 1 VIEW COMPARISON:  Radiograph 01/21/2022. FINDINGS: Mild cardiomegaly, similar. Aortic atherosclerosis. Slightly indistinct left hemidiaphragm may represent atelectasis or effusion. No pulmonary edema. No pneumothorax. No confluent airspace disease. IMPRESSION: 1. Mild cardiomegaly. 2. Slightly indistinct left hemidiaphragm may represent atelectasis or effusion. Electronically Signed   By: Keith Rake M.D.   On: 01/27/2022 00:02        Scheduled Meds:  atorvastatin  20 mg Oral Daily   Chlorhexidine Gluconate Cloth  6 each Topical Daily   FLUoxetine  40 mg Oral q AM   insulin aspart  0-9 Units Subcutaneous TID WC   levothyroxine  75 mcg Oral QAC breakfast   nortriptyline  40 mg Oral QHS   OLANZapine  5 mg Oral QHS   polyethylene glycol  17 g Oral Daily   sulfamethoxazole-trimethoprim  1 tablet Oral Q12H   tamsulosin  0.4 mg Oral Daily   traZODone  50 mg Oral QHS   Continuous Infusions:   LOS: 6 days    Time spent: over 30 min    Fayrene Helper, MD [facility]   To contact the attending provider between 7A-7P or the covering provider during after hours 7P-7A, please log into the web site [email or web address] and access using universal [place] password for that web site. If you do not have the password, please call the hospital operator.  01/28/2022, 6:32 PM

## 2022-01-28 NOTE — Progress Notes (Signed)
Physical Therapy Treatment Patient Details Name: ALAISHA Mcclure MRN: [id number] DOB: [birth year] Today's Date: 01/28/2022   History of Present Illness Pt is an 81 y/o female presents to [facility] hospital 01/21/2022 with speech deficits and weakness. PT with chronic R weakness and baseline aphasia from prior CVA. Brain MRI negative. PMH includes L MCA CVA, HTN, osteopenia, afib, sacral wound, memory deficits, and anemia    PT Comments    Pt supine in bed on arrival.  Performed short bout of gt training from bed to recliner.  Pt continues to require heavy mod to max assistance to move into standing and perform gt training.  Pt continues to require HHPT as daughter was managing her assist levels at home.     Recommendations for follow up therapy are one component of a multi-disciplinary discharge planning process, led by the attending physician.  Recommendations may be updated based on patient status, additional functional criteria and insurance authorization.  Follow Up Recommendations  Home health PT     Assistance Recommended at Discharge Frequent or constant Supervision/Assistance  Patient can return home with the following A lot of help with walking and/or transfers;A lot of help with bathing/dressing/bathroom;Assistance with cooking/housework;Direct supervision/assist for medications management;Direct supervision/assist for financial management;Assist for transportation;Help with stairs or ramp for entrance   Equipment Recommendations  BSC/3in1    Recommendations for Other Services       Precautions / Restrictions Precautions Precautions: Fall Precaution Comments: R-sided coordination deficits, foley Restrictions Weight Bearing Restrictions: No     Mobility  Bed Mobility Overal bed mobility: Needs Assistance Bed Mobility: Rolling, Sidelying to Sit Rolling: Mod assist Sidelying to sit: Mod assist       General bed mobility comments: Pt required assistance to move LEs OOB and  to elevate trunk into sitting.    Transfers Overall transfer level: Needs assistance Equipment used: Rolling walker (2 wheels) Transfers: Sit to/from Stand Sit to Stand: Mod assist, Max assist (posterior lean noted in standing.)           General transfer comment: Heavy assist required for power up to full stand from edge of bed.  Persists with posterior translation when rising into standing and required increased time to place R hand grip on RW.    Ambulation/Gait Ambulation/Gait assistance: Max assist Gait Distance (Feet): 6 Feet Assistive device: Rolling walker (2 wheels) Gait Pattern/deviations: Step-through pattern, Leaning posteriorly, Staggering left, Decreased step length - right, Ataxic Gait velocity: Decreased     General Gait Details: Performed steps from bed to recliner as her supper had arrived this evening.  Pt required assistance to turn and back with RW.  Pt starting to sit prematurely.   Stairs             Wheelchair Mobility    Modified Rankin (Stroke Patients Only)       Balance Overall balance assessment: Needs assistance Sitting-balance support: No upper extremity supported, Feet supported Sitting balance-Leahy Scale: Fair       Standing balance-Leahy Scale: Poor Standing balance comment: relies on BUE and external support                            Cognition Arousal/Alertness: Awake/alert Behavior During Therapy: WFL for tasks assessed/performed Overall Cognitive Status: History of cognitive impairments - at baseline  Exercises      General Comments        Pertinent Vitals/Pain Pain Assessment Pain Assessment: Faces Faces Pain Scale: No hurt    Home Living                          Prior Function            PT Goals (current goals can now be found in the care plan section) Acute Rehab PT Goals Patient Stated Goal: to go home Potential to  Achieve Goals: Fair Progress towards PT goals: Progressing toward goals    Frequency    Min 3X/week      PT Plan Current plan remains appropriate    Co-evaluation              AM-PAC PT "6 Clicks" Mobility   Outcome Measure  Help needed turning from your back to your side while in a flat bed without using bedrails?: A Lot Help needed moving from lying on your back to sitting on the side of a flat bed without using bedrails?: A Lot Help needed moving to and from a bed to a chair (including a wheelchair)?: A Lot Help needed standing up from a chair using your arms (e.g., wheelchair or bedside chair)?: A Lot Help needed to walk in hospital room?: A Lot Help needed climbing 3-5 steps with a railing? : Total 6 Click Score: 11    End of Session Equipment Utilized During Treatment: Gait belt Activity Tolerance: Patient tolerated treatment well Patient left: with call bell/phone within reach;with family/visitor present;in chair;with chair alarm set Nurse Communication: Mobility status PT Visit Diagnosis: Other abnormalities of gait and mobility (R26.89);Muscle weakness (generalized) (M62.81);Hemiplegia and hemiparesis Hemiplegia - Right/Left: Right Hemiplegia - dominant/non-dominant: Dominant Hemiplegia - caused by: Cerebral infarction (chronic from old L MCA)     Time: 1610-9604 PT Time Calculation (min) (ACUTE ONLY): 17 min  Charges:  $Therapeutic Activity: 8-22 mins                     Erasmo Leventhal , PTA Acute Rehabilitation Services Office [phone]    Cristela Blue 01/28/2022, 5:43 PM

## 2022-01-28 NOTE — Progress Notes (Signed)
OT Cancellation Note  Patient Details Name: Valerie Mcclure MRN: [id number] DOB: Dec 09, 1940   Cancelled Treatment:    Reason Eval/Treat Not Completed: Patient at procedure or test/ unavailable.    Jolaine Artist, OT Acute Rehabilitation Services Office [phone]   Delight Stare 01/28/2022, 1:03 PM

## 2022-01-29 DIAGNOSIS — F339 Major depressive disorder, recurrent, unspecified: Secondary | ICD-10-CM

## 2022-01-29 DIAGNOSIS — R2990 Unspecified symptoms and signs involving the nervous system: Secondary | ICD-10-CM | POA: Diagnosis not present

## 2022-01-29 DIAGNOSIS — R9389 Abnormal findings on diagnostic imaging of other specified body structures: Secondary | ICD-10-CM

## 2022-01-29 LAB — COMPREHENSIVE METABOLIC PANEL
ALT: 11 U/L (ref 0–44)
AST: 15 U/L (ref 15–41)
Albumin: 2.5 g/dL — ABNORMAL LOW (ref 3.5–5.0)
Alkaline Phosphatase: 58 U/L (ref 38–126)
Anion gap: 7 (ref 5–15)
BUN: 13 mg/dL (ref 8–23)
CO2: 22 mmol/L (ref 22–32)
Calcium: 8.5 mg/dL — ABNORMAL LOW (ref 8.9–10.3)
Chloride: 105 mmol/L (ref 98–111)
Creatinine, Ser: 1.46 mg/dL — ABNORMAL HIGH (ref 0.44–1.00)
GFR, Estimated: 36 mL/min — ABNORMAL LOW (ref >60)
Glucose, Bld: 116 mg/dL — ABNORMAL HIGH (ref 70–99)
Potassium: 4 mmol/L (ref 3.5–5.1)
Sodium: 134 mmol/L — ABNORMAL LOW (ref 135–145)
Total Bilirubin: 0.5 mg/dL (ref 0.3–1.2)
Total Protein: 5.3 g/dL — ABNORMAL LOW (ref 6.5–8.1)

## 2022-01-29 LAB — CBC WITH DIFFERENTIAL/PLATELET
Abs Immature Granulocytes: 0.09 10*3/uL — ABNORMAL HIGH (ref 0.00–0.07)
Basophils Absolute: 0 10*3/uL (ref 0.0–0.1)
Basophils Relative: 1 %
Eosinophils Absolute: 0.2 10*3/uL (ref 0.0–0.5)
Eosinophils Relative: 3 %
HCT: 28.3 % — ABNORMAL LOW (ref 36.0–46.0)
Hemoglobin: 8.8 g/dL — ABNORMAL LOW (ref 12.0–15.0)
Immature Granulocytes: 2 %
Lymphocytes Relative: 31 %
Lymphs Abs: 1.5 10*3/uL (ref 0.7–4.0)
MCH: 29 pg (ref 26.0–34.0)
MCHC: 31.1 g/dL (ref 30.0–36.0)
MCV: 93.4 fL (ref 80.0–100.0)
Monocytes Absolute: 0.6 10*3/uL (ref 0.1–1.0)
Monocytes Relative: 12 %
Neutro Abs: 2.5 10*3/uL (ref 1.7–7.7)
Neutrophils Relative %: 51 %
Platelets: 209 10*3/uL (ref 150–400)
RBC: 3.03 MIL/uL — ABNORMAL LOW (ref 3.87–5.11)
RDW: 14.8 % (ref 11.5–15.5)
WBC: 4.8 10*3/uL (ref 4.0–10.5)
nRBC: 0 % (ref 0.0–0.2)

## 2022-01-29 LAB — MAGNESIUM: Magnesium: 2.2 mg/dL (ref 1.7–2.4)

## 2022-01-29 LAB — PHOSPHORUS: Phosphorus: 2.8 mg/dL (ref 2.5–4.6)

## 2022-01-29 LAB — PROCALCITONIN: Procalcitonin: <0.1 ng/mL

## 2022-01-29 LAB — GLUCOSE, CAPILLARY
Glucose-Capillary: 117 mg/dL — ABNORMAL HIGH (ref 70–99)
Glucose-Capillary: 148 mg/dL — ABNORMAL HIGH (ref 70–99)

## 2022-01-29 MED ORDER — NORTRIPTYLINE HCL 10 MG PO CAPS: 50.0000 mg | ORAL_CAPSULE | Freq: Every day | ORAL | Status: DC

## 2022-01-29 MED ORDER — NORTRIPTYLINE HCL 50 MG PO CAPS: 50.0000 mg | ORAL_CAPSULE | Freq: Every day | ORAL | 0 refills | Status: DC

## 2022-01-29 MED ORDER — SULFAMETHOXAZOLE-TRIMETHOPRIM 400-80 MG PO TABS: 1.0000 | ORAL_TABLET | Freq: Two times a day (BID) | ORAL | 0 refills | Status: AC

## 2022-01-29 MED ORDER — ACETAMINOPHEN 500 MG PO TABS: 1000.0000 mg | ORAL_TABLET | Freq: Three times a day (TID) | ORAL | 0 refills | Status: DC | PRN

## 2022-01-29 MED ORDER — POLYETHYLENE GLYCOL 3350 17 G PO PACK: 17.0000 g | PACK | Freq: Every day | ORAL | 0 refills | Status: AC

## 2022-01-29 MED ORDER — FLUOXETINE HCL 20 MG PO CAPS: 30.0000 mg | ORAL_CAPSULE | Freq: Every day | ORAL | Status: DC

## 2022-01-29 NOTE — Progress Notes (Signed)
Nursing Discharge Note   Admit Date: 01/21/2022  Discharge date: 01/29/2022   Valerie Mcclure is to be discharged home per MD order.  AVS completed. Reviewed with patient and family at bedside. Highlighted copy provided for patient to take home.  Patient/caregiver able to verbalize understanding of discharge instructions. PIV removed. Patient stable upon discharge.   Discharge Instructions     Call MD for:  difficulty breathing, headache or visual disturbances   Complete by: As directed    Call MD for:  extreme fatigue   Complete by: As directed    Call MD for:  hives   Complete by: As directed    Call MD for:  persistant dizziness or light-headedness   Complete by: As directed    Call MD for:  persistant nausea and vomiting   Complete by: As directed    Call MD for:  persistant nausea and vomiting   Complete by: As directed    Call MD for:  redness, tenderness, or signs of infection (pain, swelling, redness, odor or green/yellow discharge around incision site)   Complete by: As directed    Call MD for:  severe uncontrolled pain   Complete by: As directed    Call MD for:  temperature >100.4   Complete by: As directed    Call MD for:  temperature >100.4   Complete by: As directed    Diet - low sodium heart healthy   Complete by: As directed    Discharge instructions   Complete by: As directed    You were seen for an episode of confusion and left sided weakness.  Your MRI was negative.  This may have been a TIA related to your atrial fibrillation.  Continue your eliquis and your statin.  Follow up with neurology outpatient.    You had a UTI.  We'll send you home with another day of abx to complete a 7 day course.  You had what seems like a vagal episode when you were straining to use the bathroom.  Start miralax daily.  Titrate this so you have a soft bowel movement, once a day to avoid straining and these vagal episodes.  You're leaving with a foley catheter in place.   [facility] on [facility]/26 at 9:15.  You had a low temperature we never clearly explained.  Consider further follow up including repeat testing of your stress hormone outpatient (cortisol).  Your thyroid tests were abnormal.  Repeat these after you've been discharged in a few weeks.  Psychiatry recommended increasing your nortriptyline to 50 mg.  Follow with your PCP regarding your mood.  You have a parotid lesion that needs follow up with the ENT doctors outpatient.  Return for new, recurrent, or worsening symptoms.  Please ask your PCP to request records from this hospitalization so they know what was done and what the next steps will be.   Discharge wound care:   Complete by: As directed    Foam dressing to buttocks/sacrum, change every 3 days or as needed for soiling.   Increase activity slowly   Complete by: As directed    No dressing needed   Complete by: As directed       Allergies as of 01/29/2022       Reactions   Clonazepam Other (See Comments)   AFFECTED MOOD NEGATIVELY- Do NOT give   Shrimp [shellfish Allergy] Anaphylaxis, Swelling, Rash, Other (See Comments)   Break outs and swelling of the throat   Valsartan  Other (See Comments)   Renal failure   Tandem Plus [fefum-fepo-fa-b Cmp-c-zn-mn-cu] Nausea And Vomiting   Ivp Dye [iodinated Contrast Media] Itching, Rash   Penicillins Itching, Rash   Has patient had a PCN reaction causing immediate rash, facial/tongue/throat swelling, SOB or lightheadedness with hypotension: Yes Has patient had a PCN reaction causing severe rash involving mucus membranes or skin necrosis: No Has patient had a PCN reaction that required hospitalization: No Has patient had a PCN reaction occurring within the last 10 years: No If all of the above answers are "NO", then may proceed with Cephalosporin use.        Medication List     STOP taking these medications    clonazePAM 0.5 MG tablet Commonly known as:  KLONOPIN       TAKE these medications    acetaminophen 500 MG tablet Commonly known as: TYLENOL Take 2 tablets (1,000 mg total) by mouth every 8 (eight) hours as needed for mild pain or headache. What changed: when to take this   atorvastatin 20 MG tablet Commonly known as: LIPITOR TAKE 1 TABLET(20 MG) BY MOUTH DAILY What changed: See the new instructions.   Eliquis 2.5 MG Tabs tablet Generic drug: apixaban TAKE 1 TABLET(2.5 MG) BY MOUTH TWICE DAILY What changed: See the new instructions.   FLUoxetine 40 MG capsule Commonly known as: PROZAC TAKE 1 CAPSULE(40 MG) BY MOUTH DAILY What changed: See the new instructions.   levothyroxine 75 MCG tablet Commonly known as: SYNTHROID TAKE 1 TABLET(75 MCG) BY MOUTH DAILY BEFORE BREAKFAST. FOLLOW-UP Strength: 75 mcg What changed:  how much to take how to take this when to take this additional instructions   metoprolol tartrate 25 MG tablet Commonly known as: LOPRESSOR Take 1 tablet (25 mg total) by mouth 2 (two) times daily.   nortriptyline 50 MG capsule Commonly known as: PAMELOR Take 1 capsule (50 mg total) by mouth at bedtime. Follow up with your PCP outpatient for additional adjustments as needed What changed:  medication strength See the new instructions.   polyethylene glycol 17 g packet Commonly known as: MIRALAX / GLYCOLAX Take 17 g by mouth daily. Take daily to avoid straining Start taking on: January 30, 2022   senna-docusate 8.6-50 MG tablet Commonly known as: Senokot-S Take 2 tablets by mouth 2 (two) times daily.   sulfamethoxazole-trimethoprim 400-80 MG tablet Commonly known as: BACTRIM Take 1 tablet by mouth 2 (two) times daily for 1 day.   tamsulosin 0.4 MG Caps capsule Commonly known as: FLOMAX Take 1 capsule (0.4 mg total) by mouth daily.   torsemide 10 MG tablet Commonly known as: DEMADEX Take 1 tablet (10 mg total) by mouth daily. What changed: when to take this               Durable  Medical Equipment  (From admission, onward)           Start     Ordered   01/23/22 1413  For home use only DME 3 n 1  Once        01/23/22 1413              Discharge Care Instructions  (From admission, onward)           Start     Ordered   01/29/22 0000  Discharge wound care:       Comments: Foam dressing to buttocks/sacrum, change every 3 days or as needed for soiling.   01/29/22 1406   01/24/22 0000  No dressing needed        01/24/22 1158             Discharge Instructions/ Education: Discharge instructions given to patient/family with verbalized understanding. Discharge education completed with patient/family including: follow up instructions, medication list, discharge activities, and limitations if indicated.  Foley care instructions provided to daughter [name] and teach back completed. Patient instructed to return to Emergency Department, call 911, or call MD for any changes in condition.  Patient escorted via wheelchair to discharge lounge with daughter [name] to await ride home.

## 2022-01-29 NOTE — TOC Transition Note (Signed)
Transition of Care [facility]) - CM/SW Discharge Note   Patient Details  Name: Valerie Mcclure MRN: [id number] Date of Birth: Aug 21, 1940  [facility] CM/SW Contact:  Pollie Friar, RN Phone Number: 01/29/2022, 2:40 PM   Clinical Narrative:    Pt is discharging home with resumption of home heath services through [place]. Information on the AVS.  Pt did not receive a  3 in 1 this admission as she had received one within 5 years. Daughter voiced understanding.  CM has updated the son via phone of d/c and he is in agreement and will provide transportation home.    Final next level of care: Home w Home Health Services Barriers to Discharge: No Barriers Identified   Patient Goals and CMS Choice   CMS Medicare.gov Compare Post Acute Care list provided to:: Patient Represenative (must comment) Choice offered to / list presented to : Adult Children  Discharge Placement                       Discharge Plan and Services   Discharge Planning Services: CM Consult Post Acute Care Choice: Home Health          DME Arranged: 3-N-1 DME Agency: [employer]       HH Arranged: PT, OT, RN [place] Agency: [place] Date [place]: 01/29/22   Representative spoke with at [place]: [place] ([place]) Interventions     Readmission Risk Interventions     No data to display

## 2022-01-29 NOTE — Assessment & Plan Note (Signed)
Needs outpatient follow up with ENT

## 2022-01-29 NOTE — Consult Note (Signed)
Valerie Mcclure Psychiatry New Face-to-Face Psychiatric Evaluation   Service Date: January 29, 2022 LOS:  LOS: 7 days    Assessment  Valerie Mcclure is a 81 y.o. female admitted medically for 01/21/2022 11:17 AM for weakness and aphasia. She carries the psychiatric diagnoses of MDD and has a past medical history of CVA with right sided deficits, HTN, HLD, hypothyroidism, T2DM, depression, chronic diastolic CHF, ACD, CKD stage IV, persistent atrial fibrillation on eliquis who presented to ED with speech deficits and weakness new from baseline. Psychiatry was consulted for evaluation following daughter's concern for her mother's depression by Dr. Kendra Mcclure. Valerie Glen, MD .   Her current presentation of mild confusion, decreased concentration, and periodic low mood is most consistent with delirium and co-occurring grief. She meets criteria for normal grief based on her current geriatric depression screening where she endorses boredom and changes in energy but also looking forward to going home, having interests in other things and leaving the house, denying any feelings of guilt, hopelessness, and helplessness. Current outpatient psychotropic medications include Prozac and nortriptyline and historically she has had a good response to these medications. However, per daughter there is concern of periodic low mood due to life stressors. Our plan is to continue current outpatient medications but increase the nortriptyline while decreasing Prozac.   On initial examination, patient was responsive to questions and did not endorse SI, she was more so expressing feelings of boredom from not having much activity on her day to day. We strongly recommend f/u with outpatient physician for close monitoring of psychiatric medications. Patient believes she will do better at home where she has things to do.   Please see plan below for detailed recommendations.   Diagnoses:  Active Hospital problems: Principal  Problem:   Stroke-like symptoms Active Problems:   Hypothyroidism   Hyperlipidemia   Diabetes mellitus without complication ([name])   Stage 3b chronic kidney disease (CKD) ([name])   Anemia of chronic disease   Hypertension   Spastic hemiplegia of right dominant side as late effect of cerebral infarction ([name])   Chronic diastolic heart failure ([name])   Depression   Persistent atrial fibrillation ([name])   SIRS (systemic inflammatory response syndrome) ([name])   Skin ulcer of sacrum ([name])   Vaso vagal episode   Hypothermia   Acute metabolic encephalopathy   Sepsis secondary to UTI ([place])   Citrobacter infection   Acute urinary retention   Left knee pain   Delirium   Right Parotid Lesion     Plan  ## Safety and Observation Level:  - Based on my clinical evaluation, I estimate the patient to be at low risk of self harm in the current setting - At this time, we recommend a routine level of observation. This decision is based on my review of the chart including patient's history and current presentation, interview of the patient, mental status examination, and consideration of suicide risk including evaluating suicidal ideation, plan, intent, suicidal or self-harm behaviors, risk factors, and protective factors. This judgment is based on our ability to directly address suicide risk, implement suicide prevention strategies and develop a safety plan while the patient is in the clinical setting. Please contact our team if there is a concern that risk level has changed.   ## Medications:  -- DECREASE Prozac to 30 mg/day -- INCREASE nortriptyline to 50 mg/day   ## Medical Decision Making Capacity:  Not assessed  ## Further Work-up:  -- most recent EKG on 7/6 had  QtC of 474 -- Pertinent labwork reviewed earlier this admission includes: none at this time  ## Disposition:  -- as per primery  ## Behavioral / Environmental:  -- none at this time  ##Legal Status -- none at this time  Thank  you for this consult request. Recommendations have been communicated to the primary team.  We will sign off due to today's planned dc.   Valerie Mcclure, Medical Student   NEW history  Relevant Aspects of Hospital Course:  Admitted on 01/21/2022 for weakness and aphasia.  Patient Report:  Patient was sitting on chair with daughter in the room. Patient provided verbal consent to allow daughter to stay in the room as we continued the interview. Interview was limited due to aphasia. Patient reports mood as "not good" but then said "don't write that I am fair". Reports 8 hours of sleep and no nightmares. She has good energy and interest in doing things but finds it hard to do anything in the hospital, which makes her bored. She reports decreased appetite and concentration. Using the geriatric depression questionnaire she did not meet criteria for depression, only reported yes for low energy and being bored. No feelings of being better of dead or helpless. Denies SI,HI, AVH. Reports that she lost her husband of 18 years, 15 months ago, and misses him but knows this is part of life.  Patient was able to say her name and the current year after repeating the question but was not able to report place. Patient oriented to situation, understands she is here for falling. She cannot report the days of the week, she cannot count down from 42 to 27 or 10 to 2. She could not repeat 3 random words back to me.   ROS:  Psych: no SI,HI, AVH.   Collateral information:  Attending, Dr. Lovette Mcclure, spoke to daughter. Who reports concerns with patient who is grieving the loss of her husband of 2 years.   Social History:  Patient was married for 55 years and lost her husband 15 months ago. 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   Family History:  The patient's family history includes Heart disease (age of onset: 3) in her father; Lung cancer (age of onset: 43) in her brother.  Medical  History: Past Medical History:  Diagnosis Date   Blood transfusion without reported diagnosis    Chronic low back pain    History of echocardiogram    Echo 5/18: EF 55-60, Gr 2 DD, mild MR, mild LAE, PASP 47 // Echo 1/18:  EF 55, mild AI, MAC, mild MR, mild LAE, trivial pericardial effusion   History of ischemic left MCA stroke 11/2016   left MCA infarct status post mechanical thrombectomy complicated by large left basal ganglia hemorrhage with right shift.   HTN (hypertension)    Hyperlipidemia    Hypothyroidism    Osteopenia    Persistent atrial fibrillation ([name])    CHADS2-VASc=6 (female, age 89, HTN, CVA) // Apixaban // rate control strategy    Sacral wound    chronic sacral wound    Surgical History: Past Surgical History:  Procedure Laterality Date   ABDOMINAL HYSTERECTOMY  1970   CHOLECYSTECTOMY  07/2009   Dr. Rise Patience   COLONOSCOPY  2003   FLEXIBLE SIGMOIDOSCOPY  2010   HAND SURGERY     IR ANGIO INTRA EXTRACRAN SEL COM CAROTID INNOMINATE UNI L MOD SED  11/29/2016   IR ANGIO VERTEBRAL SEL SUBCLAVIAN INNOMINATE  UNI R MOD SED  11/29/2016   IR PERCUTANEOUS ART THROMBECTOMY/INFUSION INTRACRANIAL INC DIAG ANGIO  11/29/2016   IR RADIOLOGIST EVAL & MGMT  03/24/2017   LUMBAR LAMINECTOMY  11/2008   Done by Dr. Patrice Paradise   RADIOLOGY WITH ANESTHESIA N/A 11/29/2016   Procedure: RADIOLOGY WITH ANESTHESIA;  Surgeon: Radiologist, Medication, MD;  Location: [facility];  Service: Radiology;  Laterality: N/A;   THYROIDECTOMY      Medications:   Current Facility-Administered Medications:    acetaminophen (TYLENOL) tablet 650 mg, 650 mg, Oral, Q4H PRN, 650 mg at 01/26/22 2146 **OR** acetaminophen (TYLENOL) 160 MG/5ML solution 650 mg, 650 mg, Per Tube, Q4H PRN **OR** acetaminophen (TYLENOL) suppository 650 mg, 650 mg, Rectal, Q4H PRN, Orma Flaming, MD   atorvastatin (LIPITOR) tablet 20 mg, 20 mg, Oral, Daily, Orma Flaming, MD, 20 mg at 01/29/22 6387   Chlorhexidine Gluconate Cloth 2 % PADS 6 each,  6 each, Topical, Daily, Vance Gather B, MD, 6 each at 01/29/22 0955   [START ON 01/30/2022] FLUoxetine (PROZAC) capsule 30 mg, 30 mg, Oral, Daily, Cinderella, Margaret A   haloperidol lactate (HALDOL) injection 1 mg, 1 mg, Intravenous, Q6H PRN, Vance Gather B, MD   insulin aspart (novoLOG) injection 0-9 Units, 0-9 Units, Subcutaneous, TID WC, Orma Flaming, MD, 1 Units at 01/29/22 1252   levothyroxine (SYNTHROID) tablet 75 mcg, 75 mcg, Oral, QAC breakfast, Orma Flaming, MD, 75 mcg at 01/29/22 5643   nortriptyline (PAMELOR) capsule 50 mg, 50 mg, Oral, QHS, Cinderella, Margaret A   OLANZapine (ZYPREXA) tablet 5 mg, 5 mg, Oral, QHS, Vance Gather B, MD, 5 mg at 01/28/22 2115   polyethylene glycol (MIRALAX / GLYCOLAX) packet 17 g, 17 g, Oral, Daily, Elodia Florence., MD, 17 g at 01/28/22 0936   senna-docusate (Senokot-S) tablet 1 tablet, 1 tablet, Oral, BID PRN, Orma Flaming, MD   sulfamethoxazole-trimethoprim (BACTRIM) 400-80 MG per tablet 1 tablet, 1 tablet, Oral, Q12H, Patrecia Pour, MD, 1 tablet at 01/29/22 0955   tamsulosin (FLOMAX) capsule 0.4 mg, 0.4 mg, Oral, Daily, Orma Flaming, MD, 0.4 mg at 01/29/22 0955   traZODone (DESYREL) tablet 50 mg, 50 mg, Oral, QHS, Patrecia Pour, MD, 50 mg at 01/28/22 2115  Allergies: Allergies  Allergen Reactions   Clonazepam Other (See Comments)    AFFECTED MOOD NEGATIVELY- Do NOT give   Shrimp [Shellfish Allergy] Anaphylaxis, Swelling, Rash and Other (See Comments)    Break outs and swelling of the throat   Valsartan Other (See Comments)    Renal failure   Tandem Plus [Fefum-Fepo-Fa-B Cmp-C-Zn-Mn-Cu] Nausea And Vomiting   Ivp Dye [Iodinated Contrast Media] Itching and Rash   Penicillins Itching and Rash    Has patient had a PCN reaction causing immediate rash, facial/tongue/throat swelling, SOB or lightheadedness with hypotension: Yes Has patient had a PCN reaction causing severe rash involving mucus membranes or skin necrosis: No Has patient had  a PCN reaction that required hospitalization: No Has patient had a PCN reaction occurring within the last 10 years: No If all of the above answers are "NO", then may proceed with Cephalosporin use.        Objective  Vital signs:  Temp:  [97.9 F (36.6 C)-99 F (37.2 C)] 97.9 F (36.6 C) (07/13 0832) Pulse Rate:  [68-74] 70 (07/13 1208) Resp:  [16-18] 18 (07/13 0832) BP: (107-133)/(41-52) 115/41 (07/13 1208) SpO2:  [94 %-100 %] 98 % (07/13 1208)  Psychiatric Specialty Exam: General Appearance: Fairly Groomed  Eye Contact:  Good  Speech:  Slow and Slurred  Volume:  Normal  Mood:  "Fair"  Affect:  Congruent  Thought Process:  Disorganized  Orientation:  Other:  partial  Thought Content:  Logical  Suicidal Thoughts:  No  Homicidal Thoughts:  No  Memory:  Immediate;   Poor Remote;   Good  Judgement:  Fair  Insight:  Good  Psychomotor Activity:  Decreased  Concentration:  Concentration: Poor and Attention Span: Poor  Recall:  Poor  Fund of Knowledge:  Poor  Language:  Fair  Akathisia:  No  Handed:  not reported  AIMS (if indicated):    Assets:  none  ADL's:  Impaired  Cognition:  Impaired,  Moderate  Sleep:  good     Physical Exam: Physical Exam Constitutional:      Appearance: Normal appearance.  HENT:     Head: Normocephalic.  Pulmonary:     Effort: Pulmonary effort is normal.  Neurological:     Mental Status: She is alert.     Motor: Weakness present.    Review of Systems  Neurological:  Positive for speech change and weakness.   Blood pressure (!) 115/41, pulse 70, temperature 97.9 F (36.6 C), resp. rate 18, SpO2 98 %. There is no height or weight on file to calculate BMI.

## 2022-01-29 NOTE — Discharge Summary (Signed)
Physician Discharge Summary  Valerie Mcclure ATF:[id number] DOB: 18-Jun-1941 DOA: 01/21/2022  PCP: Valerie Rail, MD  Admit date: 01/21/2022 Discharge date: 01/29/2022  Time spent: 40 minutes  Recommendations for Outpatient Follow-up:  Follow outpatient CBC/CMP  Repeat thyroid function tests  Consider follow cortisol outpatient Needs trial of void outpatient with urology Follow delirium outpatient Follow parotid lesion with ENT outpatient  Avoid straining to use bathroom with what sounds like vagal event - miralax scheduled  Discharge Diagnoses:  Principal Problem:   Stroke-like symptoms Active Problems:   Vaso vagal episode   Hypothermia   Sepsis secondary to UTI ([place])   Citrobacter infection   Spastic hemiplegia of right dominant side as late effect of cerebral infarction (Valerie Mcclure)   SIRS (systemic inflammatory response syndrome) (Valerie Mcclure)   Acute urinary retention   Persistent atrial fibrillation ([name])   Delirium   Chronic diastolic heart failure ([name])   Diabetes mellitus without complication (Valerie Mcclure)   Depression   Hypertension   Hyperlipidemia   Hypothyroidism   Stage 3b chronic kidney disease (CKD) (Valerie Mcclure)   Left knee pain   Right Parotid Lesion   Anemia of chronic disease   Skin ulcer of sacrum ([name])   Acute metabolic encephalopathy   Discharge Condition: stable  Diet recommendation: heart healthy  There were no vitals filed for this visit.  History of present illness:  Valerie Mcclure is an 81 y.o. female with Valerie Mcclure history of CVA, residual right-sided weakness, dysarthria, HTN, HLD, chronic HFpEF, T2DM, stage IIIb CKD, atrial fibrillation who presented to the ED 7/5 with acute worsening of aphasia that day and dysuria beginning the prior evening at home.     CT head without acute abnormality, no LVO on CTA head/neck, and MR brain showed no acute intracranial pathology, unchanged remote left MCA distribution infarct.    She was hypothermic, and remained altered, found  to have pyuria and bacteriuria for which ceftriaxone was started and urine culture sent.  Culture grew Citrobacter for which ID recommended bactrim.    7/7, recurrently retaining urine. Foley inserted.   On 7/8, her mental state has improved significantly, she is afebrile, though is not at her mobility baseline which makes discharge home at this time unsafe.   Hospital Course:  Assessment and Plan: * Stroke-like symptoms 81 year old female presenting with acute onset of left sided weakness and dysarthric speech with risk factors for stroke including age, sex, previous CVA, HTN, HLD, family history  MRI without acute intracranial pathology, remote infarct with associated chronic blood products in L MCA distribution CTA without intracranial LVO, mild atherosclerotic disease in carotid bifurcation without flow limiting stenosis, 25% diameter stenosis proximal L ICA Echo was done recently (11/2021) and EF 65-70% (see report) Carotid US 11/2021 with 1-39%  Continue eliquis, statin.  Dr. Bonner Mcclure discussed with Dr. Leonie Mcclure.  Overnight with vagal event and concern for L sided weakness -> head CT without acute abnormality.  Follow MRI brain -> without acute abnormality  Hypothermia Rectal temp 95.5 on 7/11 Improved today Unclear cause Follow cortisol wnl, follow acth stim procalcitonin wnl TSH 9.9, needs outpatient follow up No leukocytosis Continue to monitor, follow up   Vaso vagal episode Last night was having BM and became lethargic and slumped to L Suspect this was vagal event Daughter notes this has happened twice Valerie Mcclure month for 2-3 months Will start miralax daily to avoid straining Had recent echo 11/2021 with EF 65-70%, AI increased from prior study  Sepsis secondary to UTI (  Valerie Mcclure) With hypothermia, hypotension, encephalopathy Bactrim per discussion with ID Blood cx NGx5 Urine cx with citrobacter resistant to cefazolin and ceftriaxone  Acute urinary retention Foley inserted 7/7 -  possibly related to UTI? Removed 7/10, recurrent retention.  Replaced 7/11.  Will need urology follow up outpatient.  Spastic hemiplegia of right dominant side as late effect of cerebral infarction ([name]) Right side appears at baseline.  Continue eliquis Continue therapy, likely will d/c home with home health  Delirium Persistent, follow   Persistent atrial fibrillation ([name]) eliquis metoprolol  Chronic diastolic heart failure ([name]) Has chronic LE edema, but not overloaded on exam  Recent echo 5/23: showed EF 65-70% with indeterminate diastolic function and normal RV function. Moderate aortic regurgitation.  Torsemide 10 mg daily  Depression Stable, continue prozac and pamelor qhs  Worsened in setting of husbands passing this year, grief Delirium precautions  Diabetes mellitus without complication ([name]) C6C of 5.8 SSI and accuchecks qac/hs   Hypertension Metoprolol, torsemide   Hyperlipidemia Continue lipitor, goal LDL <70 LDL 35  Hypothyroidism Synthroid at current dose Elevated tsh, needs outpatient follow up  Right Parotid Lesion Needs outpatient follow up with ENT  Left knee pain Degenerative changes  Stage 3b chronic kidney disease (CKD) ([place]) At baseline of creatinine 1.4 stable Continue to monitor   Skin ulcer of sacrum ([place])    Pressure Injury 01/22/22 Sacrum Right Stage 2 -  Partial thickness loss of dermis presenting as Valerie Mcclure shallow open injury with Valerie Mcclure red, pink wound bed without slough. (Active)  01/22/22 0820  Location: Sacrum  Location Orientation: Right  Staging: Stage 2 -  Partial thickness loss of dermis presenting as Riese Hellard shallow open injury with Valerie Mcclure red, pink wound bed without slough.  Wound Description (Comments):   Present on Admission: Yes      Anemia of chronic disease hgb stable Continue to monitor       Procedures: none   Consultations: neurology  Discharge Exam: Vitals:   01/29/22 0832 01/29/22 1208  BP: (!) 133/47 (!)  115/41  Pulse: 74 70  Resp: 18   Temp: 97.9 F (36.6 C)   SpO2: 98% 98%   Daughter notes she's improved, maybe 80% of her baseline No complaints from patient  General: No acute distress. Cardiovascular: RRR Lungs: unlabored Abdomen: Soft, nontender, nondistended  Neurological: chronic right sided weakness, difficult to understand speech  Extremities: No clubbing or cyanosis. No edema.   Discharge Instructions   Discharge Instructions     Call MD for:  difficulty breathing, headache or visual disturbances   Complete by: As directed    Call MD for:  extreme fatigue   Complete by: As directed    Call MD for:  hives   Complete by: As directed    Call MD for:  persistant dizziness or light-headedness   Complete by: As directed    Call MD for:  persistant nausea and vomiting   Complete by: As directed    Call MD for:  persistant nausea and vomiting   Complete by: As directed    Call MD for:  redness, tenderness, or signs of infection (pain, swelling, redness, odor or Mcclure/yellow discharge around incision site)   Complete by: As directed    Call MD for:  severe uncontrolled pain   Complete by: As directed    Call MD for:  temperature >100.4   Complete by: As directed    Call MD for:  temperature >100.4   Complete by: As directed  Diet - low sodium heart healthy   Complete by: As directed    Discharge instructions   Complete by: As directed    You were seen for an episode of confusion and left sided weakness.  Your MRI was negative.  This may have been Amorie Rentz TIA related to your atrial fibrillation.  Continue your eliquis and your statin.  Follow up with neurology outpatient.    You had Meara Wiechman UTI.  We'll send you home with another day of abx to complete Abbrielle Batts 7 day course.  You had what seems like Kaysey Berndt vagal episode when you were straining to use the bathroom.  Start miralax daily.  Titrate this so you have Chanah Tidmore soft bowel movement, once Niyana Chesbro day to avoid straining and these vagal  episodes.  You're leaving with Markeisha Mancias foley catheter in place.  [facility] on [facility]/26 at 9:15.  You had Sinai Mahany low temperature we never clearly explained.  Consider further follow up including repeat testing of your stress hormone outpatient (cortisol).  Your thyroid tests were abnormal.  Repeat these after you've been discharged in Arleigh Odowd few weeks.  Psychiatry recommended increasing your nortriptyline to 50 mg.  Follow with your PCP regarding your mood.  You have Keith Felten parotid lesion that needs follow up with the ENT doctors outpatient.  Return for new, recurrent, or worsening symptoms.  Please ask your PCP to request records from this hospitalization so they know what was done and what the next steps will be.   Discharge wound care:   Complete by: As directed    Foam dressing to buttocks/sacrum, change every 3 days or as needed for soiling.   Increase activity slowly   Complete by: As directed    No dressing needed   Complete by: As directed       Allergies as of 01/29/2022       Reactions   Clonazepam Other (See Comments)   AFFECTED MOOD NEGATIVELY- Do NOT give   Shrimp [shellfish Allergy] Anaphylaxis, Swelling, Rash, Other (See Comments)   Break outs and swelling of the throat   Valsartan Other (See Comments)   Renal failure   Tandem Plus [fefum-fepo-fa-b Cmp-c-zn-mn-cu] Nausea And Vomiting   Ivp Dye [iodinated Contrast Media] Itching, Rash   Penicillins Itching, Rash   Has patient had Jaxsun Ciampi PCN reaction causing immediate rash, facial/tongue/throat swelling, SOB or lightheadedness with hypotension: Yes Has patient had Adella Manolis PCN reaction causing severe rash involving mucus membranes or skin necrosis: No Has patient had Dimitri Shakespeare PCN reaction that required hospitalization: No Has patient had Niya Behler PCN reaction occurring within the last 10 years: No If all of the above answers are "NO", then may proceed with Cephalosporin use.        Medication List     STOP taking these  medications    clonazePAM 0.5 MG tablet Commonly known as: KLONOPIN       TAKE these medications    acetaminophen 500 MG tablet Commonly known as: TYLENOL Take 2 tablets (1,000 mg total) by mouth every 8 (eight) hours as needed for mild pain or headache. What changed: when to take this   atorvastatin 20 MG tablet Commonly known as: LIPITOR TAKE 1 TABLET(20 MG) BY MOUTH DAILY What changed: See the new instructions.   Eliquis 2.5 MG Tabs tablet Generic drug: apixaban TAKE 1 TABLET(2.5 MG) BY MOUTH TWICE DAILY What changed: See the new instructions.   FLUoxetine 40 MG capsule Commonly known as: PROZAC TAKE 1 CAPSULE(40 MG) BY MOUTH  DAILY What changed: See the new instructions.   levothyroxine 75 MCG tablet Commonly known as: SYNTHROID TAKE 1 TABLET(75 MCG) BY MOUTH DAILY BEFORE BREAKFAST. FOLLOW-UP Strength: 75 mcg What changed:  how much to take how to take this when to take this additional instructions   metoprolol tartrate 25 MG tablet Commonly known as: LOPRESSOR Take 1 tablet (25 mg total) by mouth 2 (two) times daily.   nortriptyline 50 MG capsule Commonly known as: PAMELOR Take 1 capsule (50 mg total) by mouth at bedtime. Follow up with your PCP outpatient for additional adjustments as needed What changed:  medication strength See the new instructions.   polyethylene glycol 17 g packet Commonly known as: MIRALAX / GLYCOLAX Take 17 g by mouth daily. Take daily to avoid straining Start taking on: January 30, 2022   senna-docusate 8.6-50 MG tablet Commonly known as: Senokot-S Take 2 tablets by mouth 2 (two) times daily.   sulfamethoxazole-trimethoprim 400-80 MG tablet Commonly known as: BACTRIM Take 1 tablet by mouth 2 (two) times daily for 1 day.   tamsulosin 0.4 MG Caps capsule Commonly known as: FLOMAX Take 1 capsule (0.4 mg total) by mouth daily.   torsemide 10 MG tablet Commonly known as: DEMADEX Take 1 tablet (10 mg total) by mouth daily. What  changed: when to take this               Durable Medical Equipment  (From admission, onward)           Start     Ordered   01/23/22 1413  For home use only DME 3 n 1  Once        01/23/22 1413              Discharge Care Instructions  (From admission, onward)           Start     Ordered   01/29/22 0000  Discharge wound care:       Comments: Foam dressing to buttocks/sacrum, change every 3 days or as needed for soiling.   01/29/22 1406   01/24/22 0000  No dressing needed        01/24/22 1158           Allergies  Allergen Reactions   Clonazepam Other (See Comments)    AFFECTED MOOD NEGATIVELY- Do NOT give   Shrimp [Shellfish Allergy] Anaphylaxis, Swelling, Rash and Other (See Comments)    Break outs and swelling of the throat   Valsartan Other (See Comments)    Renal failure   Tandem Plus [Fefum-Fepo-Fa-B Cmp-C-Zn-Mn-Cu] Nausea And Vomiting   Ivp Dye [Iodinated Contrast Media] Itching and Rash   Penicillins Itching and Rash    Has patient had Rishika Mccollom PCN reaction causing immediate rash, facial/tongue/throat swelling, SOB or lightheadedness with hypotension: Yes Has patient had Saidee Geremia PCN reaction causing severe rash involving mucus membranes or skin necrosis: No Has patient had Jaselynn Tamas PCN reaction that required hospitalization: No Has patient had Janisa Labus PCN reaction occurring within the last 10 years: No If all of the above answers are "NO", then may proceed with Cephalosporin use.     Follow-up Information     Valerie Rail, MD. Schedule an appointment as soon as possible for Vaishali Baise visit in 1 week(s).   Specialty: Internal Medicine Contact information: [place] [address] [place]. Schedule an appointment as soon as possible for Yoshito Gaza visit on 02/11/2022.  Why: you have an appointment on July 26th at 9:15 for your indwelling foley catheter Contact information: [place] [phone]                 The results of significant diagnostics from this hospitalization (including imaging, microbiology, ancillary and laboratory) are listed below for reference.    Significant Diagnostic Studies: MR BRAIN WO CONTRAST  Result Date: 01/28/2022 CLINICAL DATA:  Neuro deficit, acute, stroke suspected. EXAM: MRI HEAD WITHOUT CONTRAST TECHNIQUE: Multiplanar, multiecho pulse sequences of the brain and surrounding structures were obtained without intravenous contrast. COMPARISON:  Head CT January 27, 2022; MRI of the brain January 21, 2022. FINDINGS: Brain: No acute infarction, hemorrhage, hydrocephalus, extra-axial collection or mass lesion. Area of encephalomalacia and gliosis with hemosiderin deposit involving the left temporal lobe, insular region, basal ganglia, anterior left thalamus and internal capsule consistent with known left MCA territory infarct. Decreased volume of the left thalamus, left cerebral peduncle and pons related to wallerian degeneration. Minimal white matter disease supratentorially, moderate within the pons, unchanged Vascular: Normal flow voids. Skull and upper cervical spine: Normal marrow signal. Sinuses/Orbits: Negative. Other: Vira Chaplin 1.4 cm right parotid lesion, unchanged when compared to recent MRI. IMPRESSION: 1. No acute intracranial abnormality. 2. Stable appearance of left MCA territory infarct with associated wallerian degeneration. 3. Right parotid lesion with slow growth since 2018, unchanged since most recent MR. Electronically Signed   By: Pedro Earls M.D.   On: 01/28/2022 13:19   CT HEAD WO CONTRAST (5MM)  Result Date: 01/27/2022 CLINICAL DATA:  Neuro deficit, acute, stroke suspected EXAM: CT HEAD WITHOUT CONTRAST TECHNIQUE: Contiguous axial images were obtained from the base of the skull through the vertex without intravenous contrast. RADIATION DOSE REDUCTION: This exam was performed according to the departmental  dose-optimization program which includes automated exposure control, adjustment of the mA and/or kV according to patient size and/or use of iterative reconstruction technique. COMPARISON:  01/21/2022 FINDINGS: Brain: Old large left basal ganglia/internal capsule infarct. Old left temporal lobe infarct. These findings are unchanged since prior study. Ex vacuo dilatation of the left lateral ventricle. No acute infarction, hemorrhage or hydrocephalus. Vascular: No hyperdense vessel or unexpected calcification. Skull: No acute calvarial abnormality. Sinuses/Orbits: No acute findings Other: None IMPRESSION: Old left MCA infarct. No acute intracranial abnormality. Electronically Signed   By: Rolm Baptise M.D.   On: 01/27/2022 01:19   DG CHEST PORT 1 VIEW  Result Date: 01/27/2022 CLINICAL DATA:  Crackling in chest. EXAM: PORTABLE CHEST 1 VIEW COMPARISON:  Radiograph 01/21/2022. FINDINGS: Mild cardiomegaly, similar. Aortic atherosclerosis. Slightly indistinct left hemidiaphragm may represent atelectasis or effusion. No pulmonary edema. No pneumothorax. No confluent airspace disease. IMPRESSION: 1. Mild cardiomegaly. 2. Slightly indistinct left hemidiaphragm may represent atelectasis or effusion. Electronically Signed   By: Keith Rake M.D.   On: 01/27/2022 00:02   DG Knee 1-2 Views Left  Result Date: 01/26/2022 CLINICAL DATA:  Left knee pain EXAM: LEFT KNEE - 1-2 VIEW COMPARISON:  08/03/2012 FINDINGS: Moderate tricompartment degenerative changes with joint space narrowing and spurring, progressed since prior study. No acute bony abnormality. Specifically, no fracture, subluxation, or dislocation. No joint effusion. Soft tissues are intact. IMPRESSION: Progressive moderate tricompartment degenerative changes. No acute bony abnormality. Electronically Signed   By: Rolm Baptise M.D.   On: 01/26/2022 19:14   MR BRAIN WO CONTRAST  Result Date: 01/21/2022 CLINICAL DATA:  New left-sided weakness with history of  stroke.  EXAM: MRI HEAD WITHOUT CONTRAST TECHNIQUE: Multiplanar, multiecho pulse sequences of the brain and surrounding structures were obtained without intravenous contrast. COMPARISON:  Same-day CTA head/neck MRI head 12/09/2018, CTA head/neck 11/29/2016 FINDINGS: Brain: There is no evidence of acute intracranial hemorrhage, extra-axial fluid collection, or acute infarct. Encephalomalacia in the left basal ganglia and temporal lobe in the MCA distribution with associated chronic blood products is unchanged consistent with prior infarct. There is unchanged associated ex vacuo dilatation of the left lateral ventricle and wallerian degeneration in the left cerebral peduncle. The ventricles are otherwise normal in size and configuration. Background parenchymal volume is otherwise normal. There is no significant burden of underlying chronic white matter disease. There is no mass lesion.  There is no mass effect or midline shift. Vascular: Normal flow voids. Skull and upper cervical spine: Normal marrow signal. Sinuses/Orbits: The paranasal sinuses are clear. The globes and orbits are unremarkable. Other: There is Allie Gerhold 1.5 cm T1 hypointense, diffusion restricting lesion in the right parotid gland. The lesion was present in 2018 but has slightly increased in size. IMPRESSION: 1. No acute intracranial pathology. 2. Unchanged remote infarct with associated chronic blood products in the left MCA distribution. 3. 1.5 cm lesion in the right parotid gland has slightly increased in size since 2018, favored to be benign in etiology given slow growth. Recommend nonemergent ENT referral. Electronically Signed   By: Valetta Mole M.D.   On: 01/21/2022 15:47   DG CHEST PORT 1 VIEW  Result Date: 01/21/2022 CLINICAL DATA:  81 year old female with Neomi Laidler history of hypothermia EXAM: PORTABLE CHEST 1 VIEW COMPARISON:  12/31/2021 FINDINGS: Cardiomediastinal silhouette unchanged in size and contour. No evidence of central vascular congestion. No  interlobular septal thickening. No pneumothorax or pleural effusion. Coarsened interstitial markings, with no confluent airspace disease. No acute displaced fracture. IMPRESSION: Negative for acute cardiopulmonary disease Electronically Signed   By: Corrie Mckusick D.O.   On: 01/21/2022 13:23   CT ANGIO HEAD NECK W WO CM (CODE STROKE)  Result Date: 01/21/2022 CLINICAL DATA:  Patient presented with left-sided weakness and then developed aphasia. History of left MCA stroke. EXAM: CT ANGIOGRAPHY HEAD AND NECK TECHNIQUE: Multidetector CT imaging of the head and neck was performed using the standard protocol during bolus administration of intravenous contrast. Multiplanar CT image reconstructions and MIPs were obtained to evaluate the vascular anatomy. Carotid stenosis measurements (when applicable) are obtained utilizing NASCET criteria, using the distal internal carotid diameter as the denominator. RADIATION DOSE REDUCTION: This exam was performed according to the departmental dose-optimization program which includes automated exposure control, adjustment of the mA and/or kV according to patient size and/or use of iterative reconstruction technique. CONTRAST:  29m OMNIPAQUE IOHEXOL 350 MG/ML SOLN COMPARISON:  CT head 01/21/2022 FINDINGS: CTA NECK FINDINGS Aortic arch: Mild atherosclerotic calcification aortic arch and proximal great vessels. Proximal great vessels widely patent Right carotid system: Mild atherosclerotic calcification right carotid bifurcation without stenosis. Left carotid system: Atherosclerotic calcification left carotid bifurcation and proximal left internal carotid artery. 25% diameter stenosis proximal left internal carotid artery due to calcific plaque. Vertebral arteries: Both vertebral arteries widely patent and normal Skeleton: Cervical spondylosis without acute skeletal abnormality. Other neck: 10 mm right thyroid nodule with peripheral calcification. No further imaging recommended. (Ref: J  Am Coll Radiol. 2015 Feb;12(2): 143-50). Upper chest: Lung apices clear bilaterally. Review of the MIP images confirms the above findings CTA HEAD FINDINGS Anterior circulation: Mild atherosclerotic calcification in the cavernous carotid bilaterally without stenosis. Anterior and middle cerebral  arteries patent without large vessel occlusion. Mild stenosis inferior division left middle cerebral artery. Right MCA widely patent. Anterior cerebral arteries widely patent bilaterally. Posterior circulation: Both vertebral arteries patent to the basilar without stenosis. PICA patent bilaterally. Basilar widely patent. Superior cerebellar and posterior cerebral arteries patent bilaterally without stenosis. Fetal origin left posterior cerebral artery. Venous sinuses: Normal venous enhancement Anatomic variants: None Review of the MIP images confirms the above findings IMPRESSION: 1. Negative for intracranial large vessel occlusion 2. Mild atherosclerotic disease in the carotid bifurcation without flow limiting stenosis. 25% diameter stenosis proximal left internal carotid artery. 3. These results were called by telephone at the time of interpretation on 01/21/2022 at 11:53 am to provider [facility] , who verbally acknowledged these results. Electronically Signed   By: Franchot Gallo M.D.   On: 01/21/2022 11:54   CT HEAD CODE STROKE WO CONTRAST  Result Date: 01/21/2022 CLINICAL DATA:  Code stroke. Acute neuro deficit. Left-sided weakness and dysarthria. EXAM: CT HEAD WITHOUT CONTRAST TECHNIQUE: Contiguous axial images were obtained from the base of the skull through the vertex without intravenous contrast. RADIATION DOSE REDUCTION: This exam was performed according to the departmental dose-optimization program which includes automated exposure control, adjustment of the mA and/or kV according to patient size and/or use of iterative reconstruction technique. COMPARISON:  CT head 12/13/2021 FINDINGS: Brain: Negative  for acute infarct, hemorrhage, mass. Chronic left MCA infarct unchanged. No right-sided infarct identified. Vascular: Negative for hyperdense vessel Skull: Negative Sinuses/Orbits: Paranasal sinuses clear.  Negative orbit Other: None ASPECTS ([place] Stroke Program Early CT Score) - Ganglionic level infarction (caudate, lentiform nuclei, internal capsule, insula, M1-M3 cortex): 7 - Supraganglionic infarction (M4-M6 cortex): 3 Total score (0-10 with 10 being normal): 10 IMPRESSION: 1. Negative for acute abnormality. Chronic left MCA infarct unchanged 2. ASPECTS is 10 3. Code stroke imaging results were communicated on 01/21/2022 at 11:34 am to provider Lorrin Goodell via text page Electronically Signed   By: Franchot Gallo M.D.   On: 01/21/2022 11:34   DG Chest 2 View  Result Date: 12/31/2021 CLINICAL DATA:  Syncope. EXAM: CHEST - 2 VIEW COMPARISON:  Chest radiograph dated 12/13/2021. FINDINGS: Faint left lung base density, likely atelectasis. Developing infiltrate is not excluded. Trace left pleural effusion may be present. The right lung is clear. No pneumothorax. Stable cardiac silhouette. Atherosclerotic calcification of the aorta. No acute osseous pathology. IMPRESSION: Minimal left lung base atelectasis or less likely infiltrate. Overall significant improvement in the aeration of the lungs compared to prior radiograph and near complete resolution of the previously seen opacities. Electronically Signed   By: Anner Crete M.D.   On: 12/31/2021 20:19    Microbiology: Recent Results (from the past 240 hour(s))  Resp Panel by RT-PCR (Flu Shelby Anderle&B, Covid) Anterior Nasal Swab     Status: None   Collection Time: 01/21/22 11:18 AM   Specimen: Anterior Nasal Swab  Result Value Ref Range Status   SARS Coronavirus 2 by RT PCR NEGATIVE NEGATIVE Final    Comment: (NOTE) SARS-CoV-2 target nucleic acids are NOT DETECTED.  The SARS-CoV-2 RNA is generally detectable in upper respiratory specimens during the acute phase  of infection. The lowest concentration of SARS-CoV-2 viral copies this assay can detect is 138 copies/mL. Quetzally Callas negative result does not preclude SARS-Cov-2 infection and should not be used as the sole basis for treatment or other patient management decisions. Ivannah Zody negative result may occur with  improper specimen collection/handling, submission of specimen other than nasopharyngeal swab, presence of viral  mutation(s) within the areas targeted by this assay, and inadequate number of viral copies(<138 copies/mL). Tannia Contino negative result must be combined with clinical observations, patient history, and epidemiological information. The expected result is Negative.  Fact Sheet for Patients:  EntrepreneurPulse.com.au  Fact Sheet for Healthcare Providers:  IncredibleEmployment.be  This test is no t yet approved or cleared by the [place] FDA and  has been authorized for detection and/or diagnosis of SARS-CoV-2 by FDA under an Emergency Use Authorization (EUA). This EUA will remain  in effect (meaning this test can be used) for the duration of the COVID-19 declaration under Section 564(b)(1) of the Act, 21 U.S.C.section 360bbb-3(b)(1), unless the authorization is terminated  or revoked sooner.       Influenza Vaun Hyndman by PCR NEGATIVE NEGATIVE Final   Influenza B by PCR NEGATIVE NEGATIVE Final    Comment: (NOTE) The Xpert Xpress SARS-CoV-2/FLU/RSV plus assay is intended as an aid in the diagnosis of influenza from Nasopharyngeal swab specimens and should not be used as Tyne Banta sole basis for treatment. Nasal washings and aspirates are unacceptable for Xpert Xpress SARS-CoV-2/FLU/RSV testing.  Fact Sheet for Patients: EntrepreneurPulse.com.au  Fact Sheet for Healthcare Providers: IncredibleEmployment.be  This test is not yet approved or cleared by the [place] FDA and has been authorized for detection and/or diagnosis of  SARS-CoV-2 by FDA under an Emergency Use Authorization (EUA). This EUA will remain in effect (meaning this test can be used) for the duration of the COVID-19 declaration under Section 564(b)(1) of the Act, 21 U.S.C. section 360bbb-3(b)(1), unless the authorization is terminated or revoked.  Performed at [facility], [place] [address], [place], [place] [address]   Urine Culture     Status: Abnormal   Collection Time: 01/21/22  2:14 PM   Specimen: Urine, Clean Catch  Result Value Ref Range Status   Specimen Description URINE, CLEAN CATCH  Final   Special Requests   Final    NONE Performed at [facility], [place] [address] [place], [place] [address]    Culture >=100,000 COLONIES/mL CITROBACTER AMALONATICUS (Cleaster Shiffer)  Final   Report Status 01/23/2022 FINAL  Final   Organism ID, Bacteria CITROBACTER AMALONATICUS (Ebrahim Deremer)  Final      Susceptibility   Citrobacter amalonaticus - MIC*    CEFAZOLIN >=64 RESISTANT Resistant     CEFEPIME <=0.12 SENSITIVE Sensitive     CEFTRIAXONE 32 RESISTANT Resistant     CIPROFLOXACIN <=0.25 SENSITIVE Sensitive     GENTAMICIN <=1 SENSITIVE Sensitive     IMIPENEM <=0.25 SENSITIVE Sensitive     NITROFURANTOIN 32 SENSITIVE Sensitive     TRIMETH/SULFA <=20 SENSITIVE Sensitive     PIP/TAZO <=4 SENSITIVE Sensitive     * >=100,000 COLONIES/mL CITROBACTER AMALONATICUS  Culture, blood (Routine X 2) w Reflex to ID Panel     Status: None   Collection Time: 01/21/22  2:27 PM   Specimen: BLOOD  Result Value Ref Range Status   Specimen Description BLOOD RIGHT ANTECUBITAL  Final   Special Requests   Final    BOTTLES DRAWN AEROBIC AND ANAEROBIC Blood Culture adequate volume   Culture   Final    NO GROWTH 5 DAYS Performed at [facility], [address], [place], [place] [address]    Report Status 01/26/2022 FINAL  Final  Culture, blood (Routine X 2) w Reflex to ID Panel     Status: None   Collection Time: 01/21/22  7:56 PM   Specimen: BLOOD  Result Value  Ref Range Status   Specimen Description BLOOD RIGHT ANTECUBITAL  Final   Special Requests   Final    BOTTLES DRAWN AEROBIC AND ANAEROBIC Blood Culture adequate volume   Culture   Final    NO GROWTH 5 DAYS Performed at [facility], [address] [place], [place] [address]    Report Status 01/26/2022 FINAL  Final     Labs: Basic Metabolic Panel: Recent Labs  Lab 01/24/22 0221 01/25/22 0111 01/27/22 0030 01/28/22 0702 01/29/22 0202  NA 141 137 134* 136 134*  K 3.6 3.3* 3.5 4.2 4.0  CL 109 105 102 102 105  CO2 19* '22 22 23 22  '$ GLUCOSE 96 113* 140* 94 116*  BUN '15 14 14 14 13  '$ CREATININE 1.53* 1.53* 1.68* 1.56* 1.46*  CALCIUM 8.5* 8.1* 8.3* 8.5* 8.5*  MG  --   --  1.5*  --  2.2  PHOS  --   --  3.1  --  2.8   Liver Function Tests: Recent Labs  Lab 01/24/22 0221 01/27/22 0030 01/28/22 0702 01/29/22 0202  AST 13* 14* 15 15  ALT '11 11 11 11  '$ ALKPHOS 58 58 60 58  BILITOT 0.9 0.5 0.3 0.5  PROT 5.1* 5.1* 5.1* 5.3*  ALBUMIN 2.4* 2.4* 2.3* 2.5*   No results for input(s): "LIPASE", "AMYLASE" in the last 168 hours. No results for input(s): "AMMONIA" in the last 168 hours. CBC: Recent Labs  Lab 01/24/22 0221 01/25/22 0111 01/27/22 0030 01/28/22 0702 01/29/22 0202  WBC 6.2  --  5.1 4.4 4.8  NEUTROABS  --   --   --  2.3 2.5  HGB 8.3* 8.2* 8.4* 8.7* 8.8*  HCT 26.0* 25.9* 26.6* 27.2* 28.3*  MCV 94.5  --  92.0 92.5 93.4  PLT 184  --  177 187 209   Cardiac Enzymes: No results for input(s): "CKTOTAL", "CKMB", "CKMBINDEX", "TROPONINI" in the last 168 hours. BNP: BNP (last 3 results) Recent Labs    12/13/21 0333  BNP 1,315.4*    ProBNP (last 3 results) No results for input(s): "PROBNP" in the last 8760 hours.  CBG: Recent Labs  Lab 01/28/22 1208 01/28/22 1720 01/28/22 2114 01/29/22 0830 01/29/22 1207  GLUCAP 107* 141* 189* 117* 148*       Signed:  Fayrene Helper MD.  [facility] 01/29/2022, 2:23 PM

## 2022-01-29 NOTE — Progress Notes (Signed)
Occupational Therapy Treatment Patient Details Name: Valerie Mcclure MRN: [id number] DOB: 11/11/1940 Today's Date: 01/29/2022   History of present illness Pt is an 81 y/o female presents to [facility] 01/21/2022 with speech deficits and weakness. PT with chronic R weakness and baseline aphasia from prior CVA. Brain MRI negative. Vagal event 7/10- CT/MRI negative. PMH includes L MCA CVA, HTN, osteopenia, afib, sacral wound, memory deficits, and anemia   OT comments  Pt supine in bed and agreeable to OT. Requires max assist for bed mobility and sit to stand at EOB today, demonstrates poor balance with posterior and L lateral lean when standing and poor static sitting balance with posterior drift with sitting statically.  She requires step by step cueing for transfers and safety, no recall of hand placement during transfers.  Max cueing to sequence changing gown (pt spilled coffee on herself).  Educated pt and daughter on safety with ADLs at [employer], daughter continues to report she can manage her mom at home at this level.  Will follow acutely.    Recommendations for follow up therapy are one component of a multi-disciplinary discharge planning process, led by the attending physician.  Recommendations may be updated based on patient status, additional functional criteria and insurance authorization.    Follow Up Recommendations  Home health OT (aide)    Assistance Recommended at Discharge Frequent or constant Supervision/Assistance  Patient can return home with the following  A lot of help with bathing/dressing/bathroom;Assistance with cooking/housework;Direct supervision/assist for financial management;Assist for transportation;Help with stairs or ramp for entrance;Direct supervision/assist for medications management;A lot of help with walking and/or transfers   Equipment Recommendations  Tub/shower bench    Recommendations for Other Services      Precautions / Restrictions  Precautions Precautions: Fall Precaution Comments: R-sided coordination deficits, foley Restrictions Weight Bearing Restrictions: No       Mobility Bed Mobility Overal bed mobility: Needs Assistance Bed Mobility: Supine to Sit     Supine to sit: Max assist     General bed mobility comments: Required assistance with LEs, trunk and scooting today. Poor sequencing with increased cueing and decreased sitting balance at EOB.    Transfers Overall transfer level: Needs assistance Equipment used: Rolling walker (2 wheels) Transfers: Sit to/from Stand, Bed to chair/wheelchair/BSC Sit to Stand: Max assist, +2 safety/equipment Stand pivot transfers: Mod assist, +2 safety/equipment         General transfer comment: heavy assist to power up at EOB with constant cueing for hand placement.  Heavy posterior and left lateral lean, poor ability to maintain grasp on RW with R hand.     Balance Overall balance assessment: Needs assistance Sitting-balance support: No upper extremity supported, Feet supported Sitting balance-Leahy Scale: Poor Sitting balance - Comments: requires at least min guard to min assist with cueing to attend to posture otherwise would lean posteriorly Postural control: Posterior lean Standing balance support: Bilateral upper extremity supported, During functional activity Standing balance-Leahy Scale: Poor Standing balance comment: relies on UE and external support                           ADL either performed or assessed with clinical judgement   ADL Overall ADL's : Needs assistance/impaired                 Upper Body Dressing : Maximal assistance;Sitting Upper Body Dressing Details (indicate cue type and reason): changing gown, max assist to sequence doffing gown and attending  to R UE to complete task. Lower Body Dressing: Total assistance;Sit to/from stand Lower Body Dressing Details (indicate cue type and reason): total assist to change  socks and physical assist to maintain standing position today Toilet Transfer: Maximal assistance;+2 for safety/equipment;Rolling walker (2 wheels) Toilet Transfer Details (indicate cue type and reason): sit to stand with RW, +2 safety to pivot to recliner with inability to keep R hand on RW         Functional mobility during ADLs: Maximal assistance;Moderate assistance;Rolling walker (2 wheels);Cueing for sequencing;Cueing for safety General ADL Comments: discussed safety with ADLS with pt and daugther, completing bathing in supportive chair (w/c vs bsc) due to impaired sitting balance and safety with transfers (only doing pivots)    Extremity/Trunk Assessment              Vision       Perception     Praxis      Cognition Arousal/Alertness: Awake/alert Behavior During Therapy: WFL for tasks assessed/performed Overall Cognitive Status: History of cognitive impairments - at baseline Area of Impairment: Attention, Memory, Following commands, Safety/judgement, Awareness, Problem solving                   Current Attention Level: Sustained Memory: Decreased recall of precautions, Decreased short-term memory Following Commands: Follows one step commands inconsistently, Follows one step commands with increased time Safety/Judgement: Decreased awareness of safety, Decreased awareness of deficits Awareness: Intellectual Problem Solving: Slow processing, Decreased initiation, Difficulty sequencing, Requires verbal cues, Requires tactile cues General Comments: pt requiring increased time today for all tasks (more than typical) and requires mulitmodal cueing for simple tasks        Exercises      Shoulder Instructions       General Comments daughter present and reports she is still comfortable taking her mom home at this level. reinforced safety precautions.  daughter reports, patient lays back down in bed if having diffculty, rests and then tries again.    Pertinent  Vitals/ Pain       Pain Assessment Pain Assessment: Faces Faces Pain Scale: No hurt Pain Intervention(s): Monitored during session  Home Living                                          Prior Functioning/Environment              Frequency  Min 2X/week        Progress Toward Goals  OT Goals(current goals can now be found in the care plan section)  Progress towards OT goals: Not progressing toward goals - comment (fluctuating assist)  Acute Rehab OT Goals Patient Stated Goal: home OT Goal Formulation: With patient/family Time For Goal Achievement: 02/05/22 Potential to Achieve Goals: Good  Plan Discharge plan remains appropriate;Frequency remains appropriate    Co-evaluation                 AM-PAC OT "6 Clicks" Daily Activity     Outcome Measure   Help from another person eating meals?: A Little Help from another person taking care of personal grooming?: A Little Help from another person toileting, which includes using toliet, bedpan, or urinal?: Total Help from another person bathing (including washing, rinsing, drying)?: A Lot Help from another person to put on and taking off regular upper body clothing?: A Lot Help from another person to put on and taking  off regular lower body clothing?: Total 6 Click Score: 12    End of Session Equipment Utilized During Treatment: Gait belt;Rolling walker (2 wheels)  OT Visit Diagnosis: Other abnormalities of gait and mobility (R26.89);Muscle weakness (generalized) (M62.81);Other symptoms and signs involving cognitive function;Hemiplegia and hemiparesis Hemiplegia - Right/Left: Right Hemiplegia - dominant/non-dominant: Dominant Hemiplegia - caused by: Cerebral infarction   Activity Tolerance Patient tolerated treatment well   Patient Left in chair;with call bell/phone within reach;with chair alarm set;with family/visitor present   Nurse Communication Mobility status        Time: 3009-2330 OT  Time Calculation (min): 25 min  Charges: OT General Charges $OT Visit: 1 Visit OT Treatments $Self Care/Home Management : 23-37 mins   Jolaine Artist, [place] [phone]   Delight Stare 01/29/2022, 1:16 PM

## 2022-01-30 ENCOUNTER — Telehealth: Payer: Self-pay

## 2022-01-30 ENCOUNTER — Ambulatory Visit: Payer: Self-pay

## 2022-01-30 NOTE — Telephone Encounter (Signed)
error 

## 2022-01-30 NOTE — Telephone Encounter (Signed)
Transition Care Management Follow-up Telephone Call Date of discharge and from where: 7/103/2023 from [facility] How have you been since you were released from the hospital? Per son, [name]) "she is still a little weak and tired, but getting better" Any questions or concerns? No  Items Reviewed: Did the pt receive and understand the discharge instructions provided? Yes  Medications obtained and verified? Yes  Other? No  Any new allergies since your discharge? No  Dietary orders reviewed? Yes; Heart Healthy Diet Do you have support at home? Yes  family  [place] and Equipment/Supplies: Were home health services ordered? yes If so, what is the name of the agency? [facility]  Has the agency set up a time to come to the patient's home? yes Were any new equipment or medical supplies ordered?  No What is the name of the medical supply agency? N/a Were you able to get the supplies/equipment? not applicable Do you have any questions related to the use of the equipment or supplies? No  Functional Questionnaire: (I = Independent and D = Dependent) ADLs: I  Bathing/Dressing- I  Meal Prep- I  Eating- I  Maintaining continence- I  Transferring/Ambulation- A (walker, wheelchair)  Managing Meds- I  Follow up appointments reviewed:  PCP Hospital f/u appt confirmed? Yes  Scheduled to see Billey Gosling, MD on 02/09/2022 @ 2:20 pm. [facility] f/u appt confirmed? Yes  Scheduled to see [facility] on 02/11/2022 to remove foley catheter Are transportation arrangements needed? No  If their condition worsens, is the pt aware to call PCP or go to the Emergency Dept.? Yes Was the patient provided with contact information for the PCP's office or ED? Yes Was to pt encouraged to call back with questions or concerns? Yes

## 2022-01-30 NOTE — Patient Outreach (Signed)
  Care Coordination [facility] Note Transition Care Management Follow-up Telephone Call Date of discharge and from where: [facility]/22 [facility] How have you been since you were released from the hospital? Doing well Any questions or concerns? No  Items Reviewed: Did the pt receive and understand the discharge instructions provided? Yes  Medications obtained and verified? Yes  Other? No  Any new allergies since your discharge? No  Dietary orders reviewed? No Do you have support at home? Yes   Home Care and Equipment/Supplies: Were home health services ordered? yes If so, what is the name of the agency? [facility]  Has the agency set up a time to come to the patient's home? yes Were any new equipment or medical supplies ordered?  Yes: 3 in 1 What is the name of the medical supply agency?  Were you able to get the supplies/equipment? yes Do you have any questions related to the use of the equipment or supplies? No  Functional Questionnaire: (I = Independent and D = Dependent) ADLs: D  Bathing/Dressing- D  Meal Prep- D  Eating- D  Maintaining continence- D  Transferring/Ambulation- D  Managing Meds- D  Follow up appointments reviewed:  PCP Hospital f/u appt confirmed? Yes  Scheduled to see Dr. Quay Burow on 7/24 @ 2:20. [facility] f/u appt confirmed? No   Are transportation arrangements needed? No  If their condition worsens, is the pt aware to call PCP or go to the Emergency Dept.? Yes Was the patient provided with contact information for the PCP's office or ED? Yes Was to pt encouraged to call back with questions or concerns? Yes  SDOH assessments and interventions completed:   Yes  Care Coordination Interventions Activated:  Yes Care Coordination Interventions:   Advised on appointment date and time to see PCP  Encounter Outcome:  Pt. Visit Completed  Daneen Schick, BSW, CDP Social Worker, Certified Dementia Practitioner Care Coordination [phone]

## 2022-01-31 ENCOUNTER — Observation Stay (HOSPITAL_COMMUNITY)
Admission: EM | Admit: 2022-01-31 | Discharge: 2022-02-05 | Disposition: A | Discharge status: Skilled Nursing Facility | Payer: HMO | Attending: Internal Medicine | Admitting: Internal Medicine

## 2022-01-31 DIAGNOSIS — R339 Retention of urine, unspecified: Secondary | ICD-10-CM | POA: Diagnosis not present

## 2022-01-31 DIAGNOSIS — G9341 Metabolic encephalopathy: Secondary | ICD-10-CM | POA: Diagnosis present

## 2022-01-31 DIAGNOSIS — I480 Paroxysmal atrial fibrillation: Secondary | ICD-10-CM | POA: Diagnosis present

## 2022-01-31 DIAGNOSIS — M6281 Muscle weakness (generalized): Secondary | ICD-10-CM | POA: Diagnosis not present

## 2022-01-31 DIAGNOSIS — I447 Left bundle-branch block, unspecified: Secondary | ICD-10-CM | POA: Diagnosis not present

## 2022-01-31 DIAGNOSIS — E039 Hypothyroidism, unspecified: Secondary | ICD-10-CM | POA: Diagnosis present

## 2022-01-31 DIAGNOSIS — I951 Orthostatic hypotension: Principal | ICD-10-CM | POA: Diagnosis present

## 2022-01-31 DIAGNOSIS — N1832 Chronic kidney disease, stage 3b: Secondary | ICD-10-CM | POA: Diagnosis not present

## 2022-01-31 DIAGNOSIS — Z79899 Other long term (current) drug therapy: Secondary | ICD-10-CM | POA: Insufficient documentation

## 2022-01-31 DIAGNOSIS — R001 Bradycardia, unspecified: Secondary | ICD-10-CM | POA: Diagnosis not present

## 2022-01-31 DIAGNOSIS — R4020 Unspecified coma: Secondary | ICD-10-CM | POA: Diagnosis not present

## 2022-01-31 DIAGNOSIS — I959 Hypotension, unspecified: Secondary | ICD-10-CM

## 2022-01-31 DIAGNOSIS — R410 Disorientation, unspecified: Secondary | ICD-10-CM | POA: Diagnosis not present

## 2022-01-31 DIAGNOSIS — I130 Hypertensive heart and chronic kidney disease with heart failure and stage 1 through stage 4 chronic kidney disease, or unspecified chronic kidney disease: Secondary | ICD-10-CM | POA: Diagnosis not present

## 2022-01-31 DIAGNOSIS — G8191 Hemiplegia, unspecified affecting right dominant side: Secondary | ICD-10-CM | POA: Diagnosis not present

## 2022-01-31 DIAGNOSIS — Z7901 Long term (current) use of anticoagulants: Secondary | ICD-10-CM | POA: Diagnosis not present

## 2022-01-31 DIAGNOSIS — R4182 Altered mental status, unspecified: Secondary | ICD-10-CM | POA: Diagnosis not present

## 2022-01-31 DIAGNOSIS — I5032 Chronic diastolic (congestive) heart failure: Secondary | ICD-10-CM | POA: Diagnosis present

## 2022-01-31 DIAGNOSIS — E1169 Type 2 diabetes mellitus with other specified complication: Secondary | ICD-10-CM | POA: Diagnosis present

## 2022-01-31 DIAGNOSIS — R2681 Unsteadiness on feet: Secondary | ICD-10-CM | POA: Insufficient documentation

## 2022-01-31 DIAGNOSIS — E1122 Type 2 diabetes mellitus with diabetic chronic kidney disease: Secondary | ICD-10-CM | POA: Insufficient documentation

## 2022-01-31 DIAGNOSIS — R000 Tachycardia, unspecified: Secondary | ICD-10-CM | POA: Diagnosis not present

## 2022-01-31 NOTE — ED Triage Notes (Signed)
Pt BIB EMS for confusion and bigeminy. Pt now at baseline cognition. PT took her metoprolol around 2230 and had this episode about 30 minutes after. 400cc NS PTA   Pt was d/c Thursday for uti and hypotension  84/50   74/80   100/50  HR 48-50 in bigemeny and with convert to NSR in 70's then back to bigeminy

## 2022-02-01 ENCOUNTER — Emergency Department (HOSPITAL_COMMUNITY): Payer: HMO

## 2022-02-01 ENCOUNTER — Other Ambulatory Visit: Payer: Self-pay

## 2022-02-01 ENCOUNTER — Encounter (HOSPITAL_COMMUNITY): Payer: Self-pay | Admitting: Internal Medicine

## 2022-02-01 DIAGNOSIS — R410 Disorientation, unspecified: Secondary | ICD-10-CM | POA: Diagnosis not present

## 2022-02-01 DIAGNOSIS — E1169 Type 2 diabetes mellitus with other specified complication: Secondary | ICD-10-CM | POA: Diagnosis not present

## 2022-02-01 DIAGNOSIS — N1832 Type 2 diabetes mellitus with diabetic chronic kidney disease: Secondary | ICD-10-CM | POA: Diagnosis present

## 2022-02-01 DIAGNOSIS — I951 Orthostatic hypotension: Secondary | ICD-10-CM | POA: Diagnosis not present

## 2022-02-01 DIAGNOSIS — E039 Hypothyroidism, unspecified: Secondary | ICD-10-CM

## 2022-02-01 DIAGNOSIS — G9341 Metabolic encephalopathy: Secondary | ICD-10-CM | POA: Diagnosis not present

## 2022-02-01 DIAGNOSIS — E782 Mixed hyperlipidemia: Secondary | ICD-10-CM | POA: Diagnosis not present

## 2022-02-01 DIAGNOSIS — R4182 Altered mental status, unspecified: Secondary | ICD-10-CM | POA: Diagnosis not present

## 2022-02-01 DIAGNOSIS — I5032 Chronic diastolic (congestive) heart failure: Secondary | ICD-10-CM | POA: Diagnosis not present

## 2022-02-01 DIAGNOSIS — I480 Paroxysmal atrial fibrillation: Secondary | ICD-10-CM

## 2022-02-01 DIAGNOSIS — E1122 Type 2 diabetes mellitus with diabetic chronic kidney disease: Secondary | ICD-10-CM | POA: Diagnosis not present

## 2022-02-01 LAB — CBC WITH DIFFERENTIAL/PLATELET
Abs Immature Granulocytes: 0.06 10*3/uL (ref 0.00–0.07)
Abs Immature Granulocytes: 0.06 10*3/uL (ref 0.00–0.07)
Basophils Absolute: 0 10*3/uL (ref 0.0–0.1)
Basophils Absolute: 0 10*3/uL (ref 0.0–0.1)
Basophils Relative: 0 %
Basophils Relative: 1 %
Eosinophils Absolute: 0.2 10*3/uL (ref 0.0–0.5)
Eosinophils Absolute: 0.2 10*3/uL (ref 0.0–0.5)
Eosinophils Relative: 3 %
Eosinophils Relative: 3 %
HCT: 32.2 % — ABNORMAL LOW (ref 36.0–46.0)
HCT: 29 % — ABNORMAL LOW (ref 36.0–46.0)
Hemoglobin: 9.7 g/dL — ABNORMAL LOW (ref 12.0–15.0)
Hemoglobin: 9.1 g/dL — ABNORMAL LOW (ref 12.0–15.0)
Immature Granulocytes: 1 %
Immature Granulocytes: 1 %
Lymphocytes Relative: 30 %
Lymphocytes Relative: 23 %
Lymphs Abs: 2 10*3/uL (ref 0.7–4.0)
Lymphs Abs: 1.6 10*3/uL (ref 0.7–4.0)
MCH: 29.1 pg (ref 26.0–34.0)
MCH: 29.5 pg (ref 26.0–34.0)
MCHC: 30.1 g/dL (ref 30.0–36.0)
MCHC: 31.4 g/dL (ref 30.0–36.0)
MCV: 96.7 fL (ref 80.0–100.0)
MCV: 94.2 fL (ref 80.0–100.0)
Monocytes Absolute: 0.7 10*3/uL (ref 0.1–1.0)
Monocytes Absolute: 0.5 10*3/uL (ref 0.1–1.0)
Monocytes Relative: 9 %
Monocytes Relative: 8 %
Neutro Abs: 3.9 10*3/uL (ref 1.7–7.7)
Neutro Abs: 4.5 10*3/uL (ref 1.7–7.7)
Neutrophils Relative %: 57 %
Neutrophils Relative %: 64 %
Platelets: 232 10*3/uL (ref 150–400)
Platelets: 198 10*3/uL (ref 150–400)
RBC: 3.33 MIL/uL — ABNORMAL LOW (ref 3.87–5.11)
RBC: 3.08 MIL/uL — ABNORMAL LOW (ref 3.87–5.11)
RDW: 15 % (ref 11.5–15.5)
RDW: 15.1 % (ref 11.5–15.5)
WBC: 6.9 10*3/uL (ref 4.0–10.5)
WBC: 6.9 10*3/uL (ref 4.0–10.5)
nRBC: 0 % (ref 0.0–0.2)
nRBC: 0 % (ref 0.0–0.2)

## 2022-02-01 LAB — COMPREHENSIVE METABOLIC PANEL
ALT: 14 U/L (ref 0–44)
ALT: 14 U/L (ref 0–44)
AST: 20 U/L (ref 15–41)
AST: 20 U/L (ref 15–41)
Albumin: 2.8 g/dL — ABNORMAL LOW (ref 3.5–5.0)
Albumin: 2.7 g/dL — ABNORMAL LOW (ref 3.5–5.0)
Alkaline Phosphatase: 67 U/L (ref 38–126)
Alkaline Phosphatase: 63 U/L (ref 38–126)
Anion gap: 12 (ref 5–15)
Anion gap: 11 (ref 5–15)
BUN: 18 mg/dL (ref 8–23)
BUN: 17 mg/dL (ref 8–23)
CO2: 21 mmol/L — ABNORMAL LOW (ref 22–32)
CO2: 21 mmol/L — ABNORMAL LOW (ref 22–32)
Calcium: 8.9 mg/dL (ref 8.9–10.3)
Calcium: 8.2 mg/dL — ABNORMAL LOW (ref 8.9–10.3)
Chloride: 104 mmol/L (ref 98–111)
Chloride: 107 mmol/L (ref 98–111)
Creatinine, Ser: 1.83 mg/dL — ABNORMAL HIGH (ref 0.44–1.00)
Creatinine, Ser: 1.74 mg/dL — ABNORMAL HIGH (ref 0.44–1.00)
GFR, Estimated: 27 mL/min — ABNORMAL LOW (ref >60)
GFR, Estimated: 29 mL/min — ABNORMAL LOW (ref >60)
Glucose, Bld: 134 mg/dL — ABNORMAL HIGH (ref 70–99)
Glucose, Bld: 113 mg/dL — ABNORMAL HIGH (ref 70–99)
Potassium: 4.1 mmol/L (ref 3.5–5.1)
Potassium: 3.5 mmol/L (ref 3.5–5.1)
Sodium: 137 mmol/L (ref 135–145)
Sodium: 139 mmol/L (ref 135–145)
Total Bilirubin: 0.5 mg/dL (ref 0.3–1.2)
Total Bilirubin: 0.4 mg/dL (ref 0.3–1.2)
Total Protein: 5.6 g/dL — ABNORMAL LOW (ref 6.5–8.1)
Total Protein: 5.3 g/dL — ABNORMAL LOW (ref 6.5–8.1)

## 2022-02-01 LAB — URINALYSIS, ROUTINE W REFLEX MICROSCOPIC
Bilirubin Urine: NEGATIVE
Glucose, UA: NEGATIVE mg/dL
Ketones, ur: NEGATIVE mg/dL
Leukocytes,Ua: NEGATIVE
Nitrite: NEGATIVE
Protein, ur: NEGATIVE mg/dL
Specific Gravity, Urine: 1.008 (ref 1.005–1.030)
pH: 6 (ref 5.0–8.0)

## 2022-02-01 LAB — MAGNESIUM: Magnesium: 1.6 mg/dL — ABNORMAL LOW (ref 1.7–2.4)

## 2022-02-01 LAB — GLUCOSE, CAPILLARY: Glucose-Capillary: 90 mg/dL (ref 70–99)

## 2022-02-01 LAB — CBG MONITORING, ED
Glucose-Capillary: 97 mg/dL (ref 70–99)
Glucose-Capillary: 104 mg/dL — ABNORMAL HIGH (ref 70–99)
Glucose-Capillary: 86 mg/dL (ref 70–99)

## 2022-02-01 LAB — AMMONIA: Ammonia: <10 umol/L (ref 9–35)

## 2022-02-01 LAB — TROPONIN I (HIGH SENSITIVITY): Troponin I (High Sensitivity): 8 ng/L (ref <18)

## 2022-02-01 MED ORDER — FLUOXETINE HCL 20 MG PO CAPS
40.0000 mg | ORAL_CAPSULE | Freq: Every morning | ORAL | Status: DC
Start: 2022-02-01 — End: 2022-02-05
  Administered 2022-02-01 – 2022-02-05 (×4): 40 mg via ORAL
  Filled 2022-02-01 (×5): qty 2

## 2022-02-01 MED ORDER — ACETAMINOPHEN 325 MG PO TABS: 650.0000 mg | ORAL_TABLET | Freq: Four times a day (QID) | ORAL | Status: DC | PRN

## 2022-02-01 MED ORDER — ONDANSETRON HCL 4 MG PO TABS: 4.0000 mg | ORAL_TABLET | Freq: Four times a day (QID) | ORAL | Status: DC | PRN

## 2022-02-01 MED ORDER — LACTATED RINGERS IV SOLN
INTRAVENOUS | Status: AC
Start: 2022-02-01 — End: 2022-02-01

## 2022-02-01 MED ORDER — ATORVASTATIN CALCIUM 10 MG PO TABS
20.0000 mg | ORAL_TABLET | Freq: Every day | ORAL | Status: DC
  Administered 2022-02-02 – 2022-02-05 (×3): 20 mg via ORAL
  Filled 2022-02-01 (×3): qty 2

## 2022-02-01 MED ORDER — NORTRIPTYLINE HCL 25 MG PO CAPS
50.0000 mg | ORAL_CAPSULE | Freq: Every day | ORAL | Status: DC
  Administered 2022-02-01 – 2022-02-02 (×2): 50 mg via ORAL
  Filled 2022-02-01 (×3): qty 2

## 2022-02-01 MED ORDER — MAGNESIUM SULFATE 2 GM/50ML IV SOLN
2.0000 g | Freq: Once | INTRAVENOUS | Status: AC
  Administered 2022-02-01: 2 g via INTRAVENOUS
  Filled 2022-02-01: qty 50

## 2022-02-01 MED ORDER — APIXABAN 2.5 MG PO TABS
2.5000 mg | ORAL_TABLET | Freq: Two times a day (BID) | ORAL | Status: DC
  Administered 2022-02-01 – 2022-02-05 (×7): 2.5 mg via ORAL
  Filled 2022-02-01 (×7): qty 1

## 2022-02-01 MED ORDER — ONDANSETRON HCL 4 MG/2ML IJ SOLN: 4.0000 mg | Freq: Four times a day (QID) | INTRAMUSCULAR | Status: DC | PRN

## 2022-02-01 MED ORDER — ENOXAPARIN SODIUM 30 MG/0.3ML IJ SOSY: 30.0000 mg | PREFILLED_SYRINGE | Freq: Every day | INTRAMUSCULAR | Status: DC

## 2022-02-01 MED ORDER — INSULIN ASPART 100 UNIT/ML IJ SOLN
0.0000 [IU] | Freq: Three times a day (TID) | INTRAMUSCULAR | Status: DC
  Administered 2022-02-04 (×2): 2 [IU] via SUBCUTANEOUS

## 2022-02-01 MED ORDER — TAMSULOSIN HCL 0.4 MG PO CAPS
0.4000 mg | ORAL_CAPSULE | Freq: Every day | ORAL | Status: DC
  Filled 2022-02-01: qty 1

## 2022-02-01 MED ORDER — APIXABAN 2.5 MG PO TABS
2.5000 mg | ORAL_TABLET | Freq: Two times a day (BID) | ORAL | Status: DC
Start: 2022-02-01 — End: 2022-02-01

## 2022-02-01 MED ORDER — SODIUM CHLORIDE 0.9% FLUSH
3.0000 mL | Freq: Two times a day (BID) | INTRAVENOUS | Status: DC
Start: 2022-02-01 — End: 2022-02-05
  Administered 2022-02-01 – 2022-02-05 (×8): 3 mL via INTRAVENOUS

## 2022-02-01 MED ORDER — TORSEMIDE 20 MG PO TABS
10.0000 mg | ORAL_TABLET | Freq: Every day | ORAL | Status: DC
  Filled 2022-02-01: qty 1

## 2022-02-01 MED ORDER — POLYETHYLENE GLYCOL 3350 17 G PO PACK: 17.0000 g | PACK | Freq: Every day | ORAL | Status: DC | PRN

## 2022-02-01 MED ORDER — LEVOTHYROXINE SODIUM 75 MCG PO TABS
75.0000 ug | ORAL_TABLET | Freq: Every day | ORAL | Status: DC
  Administered 2022-02-01 – 2022-02-05 (×4): 75 ug via ORAL
  Filled 2022-02-01 (×4): qty 1

## 2022-02-01 MED ORDER — ACETAMINOPHEN 650 MG RE SUPP: 650.0000 mg | Freq: Four times a day (QID) | RECTAL | Status: DC | PRN

## 2022-02-01 NOTE — Assessment & Plan Note (Addendum)
.   Patient been placed on Accu-Cheks before every meal and nightly with sliding scale insulin . Hemoglobin A1C 5.8% on 7/5 . Diabetic Diet

## 2022-02-01 NOTE — ED Notes (Signed)
Patient provided a clean gown and brief and placed a chux underneath. Patient is comfortable.

## 2022-02-01 NOTE — ED Notes (Signed)
ED TO INPATIENT HANDOFF REPORT  ED Nurse Name and Phone #: Baxter Flattery, RN  S Name/Age/Gender Valerie Mcclure 81 y.o. female Room/Bed: 019C/019C  Code Status   Code Status: DNR  Home/SNF/Other Skilled nursing facility Patient oriented to: self Is this baseline? Yes   Triage Complete: Triage complete  Chief Complaint Hypotension [I95.9]  Triage Note Pt BIB EMS for confusion and bigeminy. Pt now at baseline cognition. PT took her metoprolol around 2230 and had this episode about 30 minutes after. 400cc NS PTA   Pt was d/c Thursday for uti and hypotension  84/50   74/80   100/50  HR 48-50 in bigemeny and with convert to NSR in 70's then back to bigeminy    Allergies Allergies  Allergen Reactions   Clonazepam Other (See Comments)    AFFECTED MOOD NEGATIVELY- Do NOT give   Shrimp [Shellfish Allergy] Anaphylaxis, Swelling, Rash and Other (See Comments)    Break outs and swelling of the throat   Valsartan Other (See Comments)    Renal failure   Tandem Plus [Fefum-Fepo-Fa-B Cmp-C-Zn-Mn-Cu] Nausea And Vomiting   Ivp Dye [Iodinated Contrast Media] Itching and Rash   Penicillins Itching and Rash    Has patient had a PCN reaction causing immediate rash, facial/tongue/throat swelling, SOB or lightheadedness with hypotension: Yes Has patient had a PCN reaction causing severe rash involving mucus membranes or skin necrosis: No Has patient had a PCN reaction that required hospitalization: No Has patient had a PCN reaction occurring within the last 10 years: No If all of the above answers are "NO", then may proceed with Cephalosporin use.     Level of Care/Admitting Diagnosis ED Disposition     ED Disposition  Admit   Condition  --   [place]: [place] [[id number]]  Level of Care: Telemetry Cardiac [103]  May place patient in observation at [facility] or [place] if equivalent level of care is available:: No  Covid Evaluation: Asymptomatic -  no recent exposure (last 10 days) testing not required  Diagnosis: Hypotension [[id number]]  Admitting Physician: Vernelle Emerald [[id number]]  Attending Physician: Vernelle Emerald [[id number]]          B Medical/Surgery History Past Medical History:  Diagnosis Date   Blood transfusion without reported diagnosis    Chronic low back pain    History of echocardiogram    Echo 5/18: EF 55-60, Gr 2 DD, mild MR, mild LAE, PASP 47 // Echo 1/18:  EF 55, mild AI, MAC, mild MR, mild LAE, trivial pericardial effusion   History of ischemic left MCA stroke 11/2016   left MCA infarct status post mechanical thrombectomy complicated by large left basal ganglia hemorrhage with right shift.   HTN (hypertension)    Hyperlipidemia    Hypothyroidism    Osteopenia    Persistent atrial fibrillation ([name])    CHADS2-VASc=6 (female, age 31, HTN, CVA) // Apixaban // rate control strategy    Sacral wound    chronic sacral wound   Past Surgical History:  Procedure Laterality Date   ABDOMINAL HYSTERECTOMY  1970   CHOLECYSTECTOMY  07/2009   Dr. Rise Patience   COLONOSCOPY  2003   FLEXIBLE SIGMOIDOSCOPY  2010   HAND SURGERY     IR ANGIO INTRA EXTRACRAN SEL COM CAROTID INNOMINATE UNI L MOD SED  11/29/2016   IR ANGIO VERTEBRAL SEL SUBCLAVIAN INNOMINATE UNI R MOD SED  11/29/2016   IR PERCUTANEOUS ART THROMBECTOMY/INFUSION INTRACRANIAL INC DIAG ANGIO  11/29/2016   IR RADIOLOGIST EVAL & MGMT  03/24/2017   LUMBAR LAMINECTOMY  11/2008   Done by Dr. Patrice Paradise   RADIOLOGY WITH ANESTHESIA N/A 11/29/2016   Procedure: RADIOLOGY WITH ANESTHESIA;  Surgeon: Radiologist, Medication, MD;  Location: [place];  Service: Radiology;  Laterality: N/A;   THYROIDECTOMY       A IV Location/Drains/Wounds Patient Lines/Drains/Airways Status     Active Line/Drains/Airways     Name Placement date Placement time Site Days   Peripheral IV 02/01/22 20 G Anterior;Left Forearm 02/01/22  0001  Forearm  less than 1   Urethral Catheter Elayne, RN 16  Fr. 01/27/22  1345  --  5   Incision (Closed) --  --  -- --   Pressure Injury 01/22/22 Sacrum Right Stage 2 -  Partial thickness loss of dermis presenting as a shallow open injury with a red, pink wound bed without slough. 01/22/22  0820  -- 10            Intake/Output Last 24 hours No intake or output data in the 24 hours ending 02/01/22 1513  Labs/Imaging Results for orders placed or performed during the hospital encounter of 01/31/22 (from the past 48 hour(s))  Comprehensive metabolic panel     Status: Abnormal   Collection Time: 01/31/22 11:29 PM  Result Value Ref Range   Sodium 137 135 - 145 mmol/L   Potassium 4.1 3.5 - 5.1 mmol/L   Chloride 104 98 - 111 mmol/L   CO2 21 (L) 22 - 32 mmol/L   Glucose, Bld 134 (H) 70 - 99 mg/dL    Comment: Glucose reference range applies only to samples taken after fasting for at least 8 hours.   BUN 18 8 - 23 mg/dL   Creatinine, Ser 1.83 (H) 0.44 - 1.00 mg/dL   Calcium 8.9 8.9 - 10.3 mg/dL   Total Protein 5.6 (L) 6.5 - 8.1 g/dL   Albumin 2.8 (L) 3.5 - 5.0 g/dL   AST 20 15 - 41 U/L   ALT 14 0 - 44 U/L   Alkaline Phosphatase 67 38 - 126 U/L   Total Bilirubin 0.5 0.3 - 1.2 mg/dL   GFR, Estimated 27 (L) >60 mL/min    Comment: (NOTE) Calculated using the CKD-EPI Creatinine Equation (2021)    Anion gap 12 5 - 15    Comment: Performed at [facility], [place] [address], [place], [place] [address]  CBC with Differential     Status: Abnormal   Collection Time: 01/31/22 11:29 PM  Result Value Ref Range   WBC 6.9 4.0 - 10.5 K/uL   RBC 3.33 (L) 3.87 - 5.11 MIL/uL   Hemoglobin 9.7 (L) 12.0 - 15.0 g/dL   HCT 32.2 (L) 36.0 - 46.0 %   MCV 96.7 80.0 - 100.0 fL   MCH 29.1 26.0 - 34.0 pg   MCHC 30.1 30.0 - 36.0 g/dL   RDW 15.0 11.5 - 15.5 %   Platelets 232 150 - 400 K/uL   nRBC 0.0 0.0 - 0.2 %   Neutrophils Relative % 57 %   Neutro Abs 3.9 1.7 - 7.7 K/uL   Lymphocytes Relative 30 %   Lymphs Abs 2.0 0.7 - 4.0 K/uL   Monocytes Relative 9 %    Monocytes Absolute 0.7 0.1 - 1.0 K/uL   Eosinophils Relative 3 %   Eosinophils Absolute 0.2 0.0 - 0.5 K/uL   Basophils Relative 0 %   Basophils Absolute 0.0 0.0 - 0.1 K/uL  Immature Granulocytes 1 %   Abs Immature Granulocytes 0.06 0.00 - 0.07 K/uL    Comment: Performed at [facility], [place] [address], [place], [place] [address]  Urinalysis, Routine w reflex microscopic     Status: Abnormal   Collection Time: 02/01/22  2:05 AM  Result Value Ref Range   Color, Urine YELLOW YELLOW   APPearance CLEAR CLEAR   Specific Gravity, Urine 1.008 1.005 - 1.030   pH 6.0 5.0 - 8.0   Glucose, UA NEGATIVE NEGATIVE mg/dL   Hgb urine dipstick SMALL (A) NEGATIVE   Bilirubin Urine NEGATIVE NEGATIVE   Ketones, ur NEGATIVE NEGATIVE mg/dL   Protein, ur NEGATIVE NEGATIVE mg/dL   Nitrite NEGATIVE NEGATIVE   Leukocytes,Ua NEGATIVE NEGATIVE   RBC / HPF 0-5 0 - 5 RBC/hpf   WBC, UA 0-5 0 - 5 WBC/hpf   Bacteria, UA RARE (A) NONE SEEN   Squamous Epithelial / LPF 0-5 0 - 5   Mucus PRESENT    Hyaline Casts, UA PRESENT     Comment: Performed at [facility], [address], [place], [place] [address]  Comprehensive metabolic panel     Status: Abnormal   Collection Time: 02/01/22  5:16 AM  Result Value Ref Range   Sodium 139 135 - 145 mmol/L   Potassium 3.5 3.5 - 5.1 mmol/L   Chloride 107 98 - 111 mmol/L   CO2 21 (L) 22 - 32 mmol/L   Glucose, Bld 113 (H) 70 - 99 mg/dL    Comment: Glucose reference range applies only to samples taken after fasting for at least 8 hours.   BUN 17 8 - 23 mg/dL   Creatinine, Ser 1.74 (H) 0.44 - 1.00 mg/dL   Calcium 8.2 (L) 8.9 - 10.3 mg/dL   Total Protein 5.3 (L) 6.5 - 8.1 g/dL   Albumin 2.7 (L) 3.5 - 5.0 g/dL   AST 20 15 - 41 U/L   ALT 14 0 - 44 U/L   Alkaline Phosphatase 63 38 - 126 U/L   Total Bilirubin 0.4 0.3 - 1.2 mg/dL   GFR, Estimated 29 (L) >60 mL/min    Comment: (NOTE) Calculated using the CKD-EPI Creatinine Equation (2021)    Anion gap 11 5 - 15     Comment: Performed at [facility], [place] [address], [place], [place] [address]  Magnesium     Status: Abnormal   Collection Time: 02/01/22  5:16 AM  Result Value Ref Range   Magnesium 1.6 (L) 1.7 - 2.4 mg/dL    Comment: Performed at [facility] [address], [place], [place] [address]  CBC with Differential/Platelet     Status: Abnormal   Collection Time: 02/01/22  5:16 AM  Result Value Ref Range   WBC 6.9 4.0 - 10.5 K/uL   RBC 3.08 (L) 3.87 - 5.11 MIL/uL   Hemoglobin 9.1 (L) 12.0 - 15.0 g/dL   HCT 29.0 (L) 36.0 - 46.0 %   MCV 94.2 80.0 - 100.0 fL   MCH 29.5 26.0 - 34.0 pg   MCHC 31.4 30.0 - 36.0 g/dL   RDW 15.1 11.5 - 15.5 %   Platelets 198 150 - 400 K/uL   nRBC 0.0 0.0 - 0.2 %   Neutrophils Relative % 64 %   Neutro Abs 4.5 1.7 - 7.7 K/uL   Lymphocytes Relative 23 %   Lymphs Abs 1.6 0.7 - 4.0 K/uL   Monocytes Relative 8 %   Monocytes Absolute  0.5 0.1 - 1.0 K/uL   Eosinophils Relative 3 %   Eosinophils Absolute 0.2 0.0 - 0.5 K/uL   Basophils Relative 1 %   Basophils Absolute 0.0 0.0 - 0.1 K/uL   Immature Granulocytes 1 %   Abs Immature Granulocytes 0.06 0.00 - 0.07 K/uL    Comment: Performed at [facility] [address] [place], [place] [address]  CBG monitoring, ED     Status: None   Collection Time: 02/01/22  7:38 AM  Result Value Ref Range   Glucose-Capillary 97 70 - 99 mg/dL    Comment: Glucose reference range applies only to samples taken after fasting for at least 8 hours.  Troponin I (High Sensitivity)     Status: None   Collection Time: 02/01/22 10:10 AM  Result Value Ref Range   Troponin I (High Sensitivity) 8 <18 ng/L    Comment: (NOTE) Elevated high sensitivity troponin I (hsTnI) values and significant  changes across serial measurements may suggest ACS but many other  chronic and acute conditions are known to elevate hsTnI results.  Refer to the "Links" section for chest pain algorithms and additional  guidance. Performed at [facility], [place] [address], [place], [place] [address]   Ammonia     Status: None   Collection Time: 02/01/22 10:10 AM  Result Value Ref Range   Ammonia <10 9 - 35 umol/L    Comment: Performed at [facility], [place] [address] [place], [place] [address]  CBG monitoring, ED     Status: Abnormal   Collection Time: 02/01/22 11:14 AM  Result Value Ref Range   Glucose-Capillary 104 (H) 70 - 99 mg/dL    Comment: Glucose reference range applies only to samples taken after fasting for at least 8 hours.   CT Head Wo Contrast  Result Date: 02/01/2022 CLINICAL DATA:  Mental status change, unknown cause. EXAM: CT HEAD WITHOUT CONTRAST TECHNIQUE: Contiguous axial images were obtained from the base of the skull through the vertex without intravenous contrast. RADIATION DOSE REDUCTION: This exam was performed according to the departmental dose-optimization program which includes automated exposure control, adjustment of the mA and/or kV according to patient size and/or use of iterative reconstruction technique. COMPARISON:  Head CT 01/27/2022, MRI brain 01/28/2022. FINDINGS: Brain: Chronic infarcts are again noted in the left gangliocapsular region and anterior left temporal lobe with encephalomalacia. Background mild atrophy and small-vessel disease with stable ventricular asymmetry due to prior left MCA infarct. No new asymmetry is seen concerning for acute cortical based infarct, hemorrhage or mass effect. Basal cisterns are clear. Vascular: There are patchy calcifications of the carotid siphons but no hyperdense central vessels. Skull: No fracture or focal calvarial lesion. The calvarium, skull base and orbits are intact. Sinuses/Orbits: No acute finding. Other: None. IMPRESSION: No acute intracranial CT findings or interval changes. Old left MCA infarct and additional chronic changes. Electronically Signed   By: Telford Nab M.D.   On: 02/01/2022 01:37   DG Chest Port 1 View  Result Date:  02/01/2022 CLINICAL DATA:  Altered mental status.  Confusion. EXAM: PORTABLE CHEST 1 VIEW COMPARISON:  01/26/2022 FINDINGS: Stable mild cardiomegaly. Unchanged mediastinal contours. Aortic atherosclerosis. Improved left basilar aeration. No pulmonary edema, confluent consolidation, pleural effusion or pneumothorax. Stable osseous structures. IMPRESSION: Stable mild cardiomegaly. No acute chest findings. Electronically Signed   By: Keith Rake M.D.   On: 02/01/2022 01:12    Pending Labs Unresulted Labs (From admission, onward)  Start     Ordered   02/02/22 6010  Basic metabolic panel  Tomorrow morning,   R        02/01/22 1455            Vitals/Pain Today's Vitals   02/01/22 0700 02/01/22 0831 02/01/22 1030 02/01/22 1319  BP: 112/88 (!) 149/97 (!) 104/45 100/87  Pulse: (!) 44 84 70 70  Resp: _0 Temp:      TempSrc:      SpO2: 97% 100% 98% 98%  PainSc:        Isolation Precautions No active isolations  Medications Medications  atorvastatin (LIPITOR) tablet 20 mg (20 mg Oral Not Given 02/01/22 0950)  FLUoxetine (PROZAC) capsule 40 mg (40 mg Oral Given 02/01/22 0758)  levothyroxine (SYNTHROID) tablet 75 mcg (75 mcg Oral Given 02/01/22 0801)  nortriptyline (PAMELOR) capsule 50 mg (has no administration in time range)  insulin aspart (novoLOG) injection 0-15 Units ( Subcutaneous Not Given 02/01/22 1120)  sodium chloride flush (NS) 0.9 % injection 3 mL (3 mLs Intravenous Not Given 02/01/22 0951)  acetaminophen (TYLENOL) tablet 650 mg (has no administration in time range)    Or  acetaminophen (TYLENOL) suppository 650 mg (has no administration in time range)  polyethylene glycol (MIRALAX / GLYCOLAX) packet 17 g (has no administration in time range)  ondansetron (ZOFRAN) tablet 4 mg (has no administration in time range)    Or  ondansetron (ZOFRAN) injection 4 mg (has no administration in time range)  lactated ringers infusion ( Intravenous Restarted 02/01/22 1120)   apixaban (ELIQUIS) tablet 2.5 mg (2.5 mg Oral Not Given 02/01/22 0951)  magnesium sulfate IVPB 2 g 50 mL (0 g Intravenous Stopped 02/01/22 1044)    Mobility walks with device High fall risk   Focused Assessments Cardiac Assessment Handoff:    Lab Results  Component Value Date   CKTOTAL 55 12/09/2021   CKMB 1.8 12/19/2016   TROPONINI <0.03 12/19/2016   No results found for: "DDIMER" Does the Patient currently have chest pain? No    R Recommendations: See Admitting Provider Note  Report given to:   Additional Notes: Pleasantly confused, takes meds in applesauce IF she will take them

## 2022-02-01 NOTE — ED Notes (Signed)
DNR bracelet placed on pt and confirmed.

## 2022-02-01 NOTE — Assessment & Plan Note (Signed)
.   Resume home regimen of Synthroid 

## 2022-02-01 NOTE — Assessment & Plan Note (Signed)
No clinical evidence of cardiogenic volume overload Strict input and output monitoring Daily weights Low-sodium diet  

## 2022-02-01 NOTE — ED Provider Notes (Signed)
[facility] EMERGENCY DEPARTMENT Provider Note   CSN: [id number] Arrival date & time: 01/31/22  2330     History  Chief Complaint  Patient presents with   Hypotension    Valerie Mcclure is a 81 y.o. female.  The history is provided by the patient, the EMS personnel and medical records.  Valerie Mcclure is a 81 y.o. female who presents to the Emergency Department complaining of altered mental status.  Level 5 caveat due to altered mental status.  History is provided by EMS.  She presents to the emergency department by EMS for confusion, per EMS at baseline cognition at time of ED arrival.  She took her home metoprolol at 2230 and had an episode 30 minutes after taking the medication.  She received 400 cc normal saline prior to arrival.  She was recently admitted for altered mental status and UTI and had a Foley catheter placed and discharged on July 13.  Prehospital Blood pressures 84/50, 74/80, 100/50.  Prehospital heart rate 48-50 in bigeminy followed by normal sinus rhythm in the 70s.    Additional history available from patient's daughter after her initial ED assessment.  Patient lives with the daughter normally ambulates with a walker.  Daughter states that at baseline and she is more confused but after taking her evening metoprolol she started talking out of her head and acting very strange.  They checked her blood pressure at home and it was low.  She seemed more confused and delusional.  She has been confused since her recent hospitalization.  She did finish her antibiotic for UTI.  She has been eating and drinking well.   Home Medications Prior to Admission medications   Medication Sig Start Date End Date Taking? Authorizing Provider  acetaminophen (TYLENOL) 500 MG tablet Take 2 tablets (1,000 mg total) by mouth every 8 (eight) hours as needed for mild pain or headache. 01/29/22   Elodia Florence., MD  atorvastatin (LIPITOR) 20 MG tablet TAKE 1  TABLET(20 MG) BY MOUTH DAILY Patient taking differently: Take 20 mg by mouth daily. 04/10/21   Burns, Claudina Lick, MD  ELIQUIS 2.5 MG TABS tablet TAKE 1 TABLET(2.5 MG) BY MOUTH TWICE DAILY Patient taking differently: Take 2.5 mg by mouth 2 (two) times daily. 11/12/21   [name], MD  FLUoxetine (PROZAC) 40 MG capsule TAKE 1 CAPSULE(40 MG) BY MOUTH DAILY Patient taking differently: Take 40 mg by mouth in the morning. 01/16/22   Hoyt Koch, MD  levothyroxine (SYNTHROID) 75 MCG tablet TAKE 1 TABLET(75 MCG) BY MOUTH DAILY BEFORE BREAKFAST. FOLLOW-UP Strength: 75 mcg Patient taking differently: Take 75 mcg by mouth daily before breakfast. 01/19/22   Hoyt Koch, MD  metoprolol tartrate (LOPRESSOR) 25 MG tablet Take 1 tablet (25 mg total) by mouth 2 (two) times daily. 01/24/22   Patrecia Pour, MD  nortriptyline (PAMELOR) 50 MG capsule Take 1 capsule (50 mg total) by mouth at bedtime. Follow up with your PCP outpatient for additional adjustments as needed 01/29/22 02/28/22  Elodia Florence., MD  polyethylene glycol (MIRALAX / GLYCOLAX) 17 g packet Take 17 g by mouth daily. Take daily to avoid straining 01/30/22   Elodia Florence., MD  senna-docusate (SENOKOT-S) 8.6-50 MG tablet Take 2 tablets by mouth 2 (two) times daily. Patient not taking: Reported on 01/21/2022 12/17/21   Flora Lipps, MD  tamsulosin (FLOMAX) 0.4 MG CAPS capsule Take 1 capsule (0.4 mg total) by mouth daily. 01/05/22  [name], MD  torsemide (DEMADEX) 10 MG tablet Take 1 tablet (10 mg total) by mouth daily. Patient taking differently: Take 10 mg by mouth in the morning. 01/03/22   Shelly Coss, MD      Allergies    Clonazepam, Shrimp [shellfish allergy], Valsartan, Tandem plus [fefum-fepo-fa-b cmp-c-zn-mn-cu], Ivp dye [iodinated contrast media], and Penicillins    Review of Systems   Review of Systems  All other systems reviewed and are negative.   Physical Exam Updated Vital Signs BP 95/66    Pulse 70   Temp 97.6 F (36.4 C) (Oral)   Resp 18   SpO2 (!) 80%  Physical Exam Vitals and nursing note reviewed.  Constitutional:      Appearance: She is well-developed.  HENT:     Head: Normocephalic and atraumatic.  Cardiovascular:     Rate and Rhythm: Normal rate and regular rhythm.     Heart sounds: No murmur heard. Pulmonary:     Effort: Pulmonary effort is normal. No respiratory distress.     Breath sounds: Normal breath sounds.  Abdominal:     Palpations: Abdomen is soft.     Tenderness: There is no guarding or rebound.     Comments: Mild lower abdominal tenderness  Musculoskeletal:        General: No tenderness.     Comments: Edema and chronic venous stasis changes to bilateral lower extremities  Skin:    General: Skin is warm and dry.  Neurological:     Mental Status: She is alert.     Comments: Awake and alert.  Dense expressive aphasia.  5 out of 5 grip strength in bilateral upper extremities  Psychiatric:        Behavior: Behavior normal.     ED Results / Procedures / Treatments   Labs (all labs ordered are listed, but only abnormal results are displayed) Labs Reviewed  COMPREHENSIVE METABOLIC PANEL - Abnormal; Notable for the following components:      Result Value   CO2 21 (*)    Glucose, Bld 134 (*)    Creatinine, Ser 1.83 (*)    Total Protein 5.6 (*)    Albumin 2.8 (*)    GFR, Estimated 27 (*)    All other components within normal limits  CBC WITH DIFFERENTIAL/PLATELET - Abnormal; Notable for the following components:   RBC 3.33 (*)    Hemoglobin 9.7 (*)    HCT 32.2 (*)    All other components within normal limits  URINALYSIS, ROUTINE W REFLEX MICROSCOPIC - Abnormal; Notable for the following components:   Hgb urine dipstick SMALL (*)    Bacteria, UA RARE (*)    All other components within normal limits  CBG MONITORING, ED    EKG EKG Interpretation  Date/Time:  Sunday February 01 2022 01:35:00 EDT Ventricular Rate:  66 PR  Interval:  281 QRS Duration: 181 QT Interval:  452 QTC Calculation: 474 R Axis:   -64 Text Interpretation: Sinus rhythm Ventricular premature complex Prolonged PR interval Left bundle branch block Confirmed by Quintella Reichert [phone]) on 02/01/2022 2:12:52 AM  Radiology CT Head Wo Contrast  Result Date: 02/01/2022 CLINICAL DATA:  Mental status change, unknown cause. EXAM: CT HEAD WITHOUT CONTRAST TECHNIQUE: Contiguous axial images were obtained from the base of the skull through the vertex without intravenous contrast. RADIATION DOSE REDUCTION: This exam was performed according to the departmental dose-optimization program which includes automated exposure control, adjustment of the mA and/or kV according to patient size and/or  use of iterative reconstruction technique. COMPARISON:  Head CT 01/27/2022, MRI brain 01/28/2022. FINDINGS: Brain: Chronic infarcts are again noted in the left gangliocapsular region and anterior left temporal lobe with encephalomalacia. Background mild atrophy and small-vessel disease with stable ventricular asymmetry due to prior left MCA infarct. No new asymmetry is seen concerning for acute cortical based infarct, hemorrhage or mass effect. Basal cisterns are clear. Vascular: There are patchy calcifications of the carotid siphons but no hyperdense central vessels. Skull: No fracture or focal calvarial lesion. The calvarium, skull base and orbits are intact. Sinuses/Orbits: No acute finding. Other: None. IMPRESSION: No acute intracranial CT findings or interval changes. Old left MCA infarct and additional chronic changes. Electronically Signed   By: Telford Nab M.D.   On: 02/01/2022 01:37   DG Chest Port 1 View  Result Date: 02/01/2022 CLINICAL DATA:  Altered mental status.  Confusion. EXAM: PORTABLE CHEST 1 VIEW COMPARISON:  01/26/2022 FINDINGS: Stable mild cardiomegaly. Unchanged mediastinal contours. Aortic atherosclerosis. Improved left basilar aeration. No pulmonary  edema, confluent consolidation, pleural effusion or pneumothorax. Stable osseous structures. IMPRESSION: Stable mild cardiomegaly. No acute chest findings. Electronically Signed   By: Keith Rake M.D.   On: 02/01/2022 01:12    Procedures Procedures    Medications Ordered in ED Medications - No data to display  ED Course/ Medical Decision Making/ A&P                           Medical Decision Making Amount and/or Complexity of Data Reviewed Labs: ordered. Radiology: ordered.  Risk Decision regarding hospitalization.   Patient with history of prior CVA, dementia, recent hospitalization for UTI here for evaluation of worsening altered mental status after taking her home blood pressure medications.  She did have hypotension with this event with pressures down to the 70s.  She did receive fluid bolus by EMS prior to ED arrival with improvement in her blood pressure.  Patient is very confused on ED evaluation.  Family believes she is at her baseline but they are not at the bedside to clarify this.  Suspect that her medications may be a factor in her hypotension as well as transient bradycardia.  Recommend observation for medication adjustment.  Medicine consulted for admission.        Final Clinical Impression(s) / ED Diagnoses Final diagnoses:  Hypotensive episode    Rx / DC Orders ED Discharge Orders     None         Quintella Reichert, MD 02/01/22 [phone]

## 2022-02-01 NOTE — Assessment & Plan Note (Signed)
   We will monitor patient as we temporarily hold her metoprolol  Patient may benefit from further reduction of metoprolol dosing to 12.5 mg or complete discontinuation  Monitoring patient on telemetry  continue home regimen of apixaban

## 2022-02-01 NOTE — Assessment & Plan Note (Signed)
   Daughter reports that patient has been exhibiting increased confusion beyond her baseline since discharge  Overall, it is difficult to evaluate the patient due to her significant dysarthria.  I believe patient may have some degree of underlying dementia, possibly vascular dementia that may be precipitating these perceived changes by the daughter  CT head unremarkable  Recent vitamin B12 and TSH unremarkable  No clinical evidence of infection  Patient is not on a significant burden of sedating medications  Obtaining ammonia level

## 2022-02-01 NOTE — Assessment & Plan Note (Signed)
.   Continuing home regimen of lipid lowering therapy.  

## 2022-02-01 NOTE — Progress Notes (Addendum)
Brief same-day note:  Patient is a 21 female with history of stroke with right-sided spastic hemiplegia, dysarthria, hypertension, hypothyroidism, hyperlipidemia, diastolic CHF, diabetes type 2, orthostatic hypotension, CKD stage IIIb, A-fib on Eliquis who was brought to the hospital by her daughter after she became confused, bradycardic, hypotensive after given a dose of metoprolol.  She was recently discharged from here after she was managed for acute left-sided weakness, dysarthria, MRI/CT angiogram of the head and neck was not consistent with a stroke.  She was also treated for UTI and was discharged home on 7/13.  When EMS arrived, she was hypertensive with systolic blood pressure in the range of 70s. She was hypotensive on arrival here.  CT of the head did not show any acute findings.  Urinalysis with no signs of UTI.  She was admitted for the duration of hypotension/bradycardia. Patient seen and examined at the bedside this morning in the emergency department.  Her blood pressure was stable during my evaluation.  She was confused and oriented to place only.  Assessment and plan:  Syncope/hypotension: History of orthostatic hypotension.  Episode of syncope during recent hospitalization.  He became hypotensive and briefly unresponsive after given metoprolol.  No evidence of heart block on EKG or telemetry.  Started on IV fluids.  Metoprolol on hold.  Recent echo showed preserved ejection fraction.  CT head did not show any acute intracranial findings.  PT consulted again. She might benefit with TED hose, midodrine.  Continue IV fluids for now.  Diabetes type 2: Recent hemoglobin A1c of 5.8.  Monitor blood sugars  CKD stage IIIb: Currently in function at baseline.  Baseline creatinine 1.5-1.6  Confusion: Patient is intermittently confused at baseline but more confused at home.  She has history of dysarthria.  Might have underlying dementia.  CT head unremarkable.  Recent vitamin B-12, TSH  unremarkable.  Ammonia level normal. Continue monitoring mental status, frequent reorientation, delirium precautions.  Hyperlipidemia: Continue statin  Paroxysmal A-fib: Currently metoprolol on hold.  On Eliquis for anticoagulation  Chronic systolic CHF: Euvolemic.  Hypothyroidism: Continue Synthyroid

## 2022-02-01 NOTE — Assessment & Plan Note (Signed)
Strict intake and output monitoring Creatinine near baseline Minimizing nephrotoxic agents as much as possible Serial chemistries to monitor renal function and electrolytes  

## 2022-02-01 NOTE — H&P (Signed)
History and Physical    Patient: Valerie Mcclure MRN: [id number] DOA: 01/31/2022  Date of Service: the patient was seen and examined on 02/01/2022  Patient coming from: Home  Chief Complaint:  Chief Complaint  Patient presents with   Hypotension    HPI:   81 year old female with past medical history of previous stroke (left MCA distribution infarct) with right-sided spastic hemiplegia, dysarthria, hypertension, hypothyroidism, hyperlipidemia, diastolic congestive heart failure (Echo 11/2021 EF 65-70%), diabetes mellitus type 2, orthostatic hypotension, chronic kidney disease stage IIIb, atrial fibrillation (on eliquis) presenting to [facility] emergency department via EMS sudden onset of minimal responsiveness hypotension and bradycardia.  Of note, patient was recently hospitalized at [facility] from 7/5 until 7/13 initially presenting with acute left-sided weakness and dysarthria.  Work-up including MRI brain and CT angiogram of the head and neck was not consistent with stroke.  Patient was additionally treated for sepsis thought to be secondary to urinary tract infection with a course of antibiotic therapy.  Patient was discharged home on 7/13.  Patient's daughter reports that since discharge, her mother "has not been acting quite right."  She states that her mother has been more lethargic than usual and has become increasingly disoriented and confused in general.  According to the patient's daughter patient took her home dose of metoprolol the evening of 7/15 at approximately 10:30 PM.  Shortly thereafter she reports that her mother "blacked out" and was unresponsive for period of approximately 2 minutes.  There is no evidence of tonic-clonic seizure activity, loss of bowel or bladder incontinence or tongue biting during this episode.  Patient did eventually regain consciousness.  Upon EMS arrival patient was found to be hypotensive with blood pressures as low as 74/80  with concurrent bradycardia with heart rates in the 40s.  Patient was promptly brought into [facility] emergency department for evaluation.  Upon evaluation in the emergency department symptoms continued to persist.  CT imaging of the head without contrast was unremarkable.  Urinalysis was found to be unremarkable as well as patient's chemistry.  Due transient hypotension/bradycardia and transient neurologic symptoms ER provider is requesting overnight observation for titration of AV nodal blocking/antihypertensive therapy.  Review of Systems: Review of Systems  Unable to perform ROS: Mental status change     Past Medical History:  Diagnosis Date   Blood transfusion without reported diagnosis    Chronic low back pain    History of echocardiogram    Echo 5/18: EF 55-60, Gr 2 DD, mild MR, mild LAE, PASP 47 // Echo 1/18:  EF 55, mild AI, MAC, mild MR, mild LAE, trivial pericardial effusion   History of ischemic left MCA stroke 11/2016   left MCA infarct status post mechanical thrombectomy complicated by large left basal ganglia hemorrhage with right shift.   HTN (hypertension)    Hyperlipidemia    Hypothyroidism    Osteopenia    Persistent atrial fibrillation ([name])    CHADS2-VASc=6 (female, age 89, HTN, CVA) // Apixaban // rate control strategy    Sacral wound    chronic sacral wound    Past Surgical History:  Procedure Laterality Date   ABDOMINAL HYSTERECTOMY  1970   CHOLECYSTECTOMY  07/2009   Dr. Rise Patience   COLONOSCOPY  2003   FLEXIBLE SIGMOIDOSCOPY  2010   HAND SURGERY     IR ANGIO INTRA EXTRACRAN SEL COM CAROTID INNOMINATE UNI L MOD SED  11/29/2016   IR ANGIO VERTEBRAL SEL SUBCLAVIAN INNOMINATE UNI R MOD  SED  11/29/2016   IR PERCUTANEOUS ART THROMBECTOMY/INFUSION INTRACRANIAL INC DIAG ANGIO  11/29/2016   IR RADIOLOGIST EVAL & MGMT  03/24/2017   LUMBAR LAMINECTOMY  11/2008   Done by Dr. Patrice Paradise   RADIOLOGY WITH ANESTHESIA N/A 11/29/2016   Procedure: RADIOLOGY WITH  ANESTHESIA;  Surgeon: Radiologist, Medication, MD;  Location: [place];  Service: Radiology;  Laterality: N/A;   THYROIDECTOMY      Social History:  reports that she has never smoked. She has never used smokeless tobacco. She reports that she does not drink alcohol and does not use drugs.  Allergies  Allergen Reactions   Clonazepam Other (See Comments)    AFFECTED MOOD NEGATIVELY- Do NOT give   Shrimp [Shellfish Allergy] Anaphylaxis, Swelling, Rash and Other (See Comments)    Break outs and swelling of the throat   Valsartan Other (See Comments)    Renal failure   Tandem Plus [Fefum-Fepo-Fa-B Cmp-C-Zn-Mn-Cu] Nausea And Vomiting   Ivp Dye [Iodinated Contrast Media] Itching and Rash   Penicillins Itching and Rash    Has patient had a PCN reaction causing immediate rash, facial/tongue/throat swelling, SOB or lightheadedness with hypotension: Yes Has patient had a PCN reaction causing severe rash involving mucus membranes or skin necrosis: No Has patient had a PCN reaction that required hospitalization: No Has patient had a PCN reaction occurring within the last 10 years: No If all of the above answers are "NO", then may proceed with Cephalosporin use.     Family History  Problem Relation Age of Onset   Heart disease Father 42       MI age 110s   Lung cancer Brother 6   Colon cancer Neg Hx    Esophageal cancer Neg Hx    Rectal cancer Neg Hx    Stomach cancer Neg Hx     Prior to Admission medications   Medication Sig Start Date End Date Taking? Authorizing Provider  acetaminophen (TYLENOL) 500 MG tablet Take 2 tablets (1,000 mg total) by mouth every 8 (eight) hours as needed for mild pain or headache. 01/29/22   Elodia Florence., MD  atorvastatin (LIPITOR) 20 MG tablet TAKE 1 TABLET(20 MG) BY MOUTH DAILY Patient taking differently: Take 20 mg by mouth daily. 04/10/21   Burns, Claudina Lick, MD  ELIQUIS 2.5 MG TABS tablet TAKE 1 TABLET(2.5 MG) BY MOUTH TWICE DAILY Patient taking  differently: Take 2.5 mg by mouth 2 (two) times daily. 11/12/21   [name], MD  FLUoxetine (PROZAC) 40 MG capsule TAKE 1 CAPSULE(40 MG) BY MOUTH DAILY Patient taking differently: Take 40 mg by mouth in the morning. 01/16/22   Hoyt Koch, MD  levothyroxine (SYNTHROID) 75 MCG tablet TAKE 1 TABLET(75 MCG) BY MOUTH DAILY BEFORE BREAKFAST. FOLLOW-UP Strength: 75 mcg Patient taking differently: Take 75 mcg by mouth daily before breakfast. 01/19/22   Hoyt Koch, MD  metoprolol tartrate (LOPRESSOR) 25 MG tablet Take 1 tablet (25 mg total) by mouth 2 (two) times daily. 01/24/22   Patrecia Pour, MD  nortriptyline (PAMELOR) 50 MG capsule Take 1 capsule (50 mg total) by mouth at bedtime. Follow up with your PCP outpatient for additional adjustments as needed 01/29/22 02/28/22  Elodia Florence., MD  polyethylene glycol (MIRALAX / GLYCOLAX) 17 g packet Take 17 g by mouth daily. Take daily to avoid straining 01/30/22   Elodia Florence., MD  senna-docusate (SENOKOT-S) 8.6-50 MG tablet Take 2 tablets by mouth 2 (two) times daily. Patient  not taking: Reported on 01/21/2022 12/17/21   Flora Lipps, MD  tamsulosin (FLOMAX) 0.4 MG CAPS capsule Take 1 capsule (0.4 mg total) by mouth daily. 01/05/22   [name], MD  torsemide (DEMADEX) 10 MG tablet Take 1 tablet (10 mg total) by mouth daily. Patient taking differently: Take 10 mg by mouth in the morning. 01/03/22   Shelly Coss, MD    Physical Exam:  Vitals:   02/01/22 0600 02/01/22 0615 02/01/22 0700 02/01/22 0831  BP: 126/62  112/88 (!) 149/97  Pulse: 76 80 (!) 44 84  Resp: _0 Temp:      TempSrc:      SpO2: 92% 91% 97% 100%    Constitutional: Awake and alert and oriented x1, no associated distress.   Skin: no rashes, no lesions, good skin turgor noted. Eyes: Pupils are equally reactive to light.  No evidence of scleral icterus or conjunctival pallor.  ENMT: Slightly dry mucous membranes noted.  Posterior  pharynx clear of any exudate or lesions.   Neck: normal, supple, no masses, no thyromegaly.  No evidence of jugular venous distension.   Respiratory: clear to auscultation bilaterally, no wheezing, no crackles. Normal respiratory effort. No accessory muscle use.  Cardiovascular: Regular rate and rhythm, no murmurs / rubs / gallops. No extremity edema. 2+ pedal pulses. No carotid bruits.  Chest:   Nontender without crepitus or deformity.   Back:   Nontender without crepitus or deformity. Abdomen: Abdomen is soft and nontender.  No evidence of intra-abdominal masses.  Positive bowel sounds noted in all quadrants.   Musculoskeletal: No joint deformity upper and lower extremities. Good ROM, no contractures. Normal muscle tone.  Neurologic: Notable dysarthria throughout the interview which I believe is the patient's baseline.  Otherwise,  CN 2-12 grossly intact. Sensation intact.  Patient moving all 4 extremities spontaneously.  Patient is following all commands.  Patient is responsive to verbal stimuli.   Psychiatric: Unable to fully assess due to significant dysarthria.  Patient does not seem to possess insight as to her current situation.  Data Reviewed:  I have personally reviewed and interpreted labs, imaging.  Significant findings are:  Urinalysis unremarkable. CT head without contrast Revealing old left MCA infarct with no other acute change. Chemistry revealing sodium 137, potassium 4.1, chloride 104, bicarbonate 21, BUN 18, creatinine 1.83 CBC revealing white blood cell count of 6.9, hemoglobin 9.7, hematocrit 32.2, platelet count 232  EKG: Personally reviewed.  Rhythm is normal sinus rhythm with heart rate of 66 bpm.  Evidence of left bundle branch block.  No dynamic ST segment changes appreciated.   Assessment and Plan: * Orthostatic syncope Patient presenting with recurrent bouts of syncope Patient experienced an episode of syncope during her recent hospitalization earlier this  month and again yesterday evening Patient already has a known history of orthostatic hypotension which has likely predisposed her to episodes of orthostatic syncope with episodes seemingly occurring after each occasion after she takes her evening dose of metoprolol.   No evidence of heart block on EKG or telemetry No clinical evidence of infection Holding home regimen of metoprolol for now Gentle intravenous hydration Patient has already had an echocardiogram in the past several months with preserved ejection fraction and therefore this will not be repeated Noncontrast CT head unremarkable PT eval Troponin pending Patient may benefit from ongoing use of compression stockings or fludrocortisone prior to discharge   Type 2 diabetes mellitus with stage 3b chronic kidney disease, without long-term current  use of insulin ([place]) Patient been placed on Accu-Cheks before every meal and nightly with sliding scale insulin Hemoglobin A1C 5.8% on 7/5 Diabetic Diet   Acute metabolic encephalopathy Daughter reports that patient has been exhibiting increased confusion beyond her baseline since discharge Overall, it is difficult to evaluate the patient due to her significant dysarthria.  I believe patient may have some degree of underlying dementia, possibly vascular dementia that may be precipitating these perceived changes by the daughter CT head unremarkable Recent vitamin B12 and TSH unremarkable No clinical evidence of infection Patient is not on a significant burden of sedating medications Obtaining ammonia level  Mixed diabetic hyperlipidemia associated with type 2 diabetes mellitus ([place]) Continuing home regimen of lipid lowering therapy.   Paroxysmal atrial fibrillation ([name]) We will monitor patient as we temporarily hold her metoprolol Patient may benefit from further reduction of metoprolol dosing to 12.5 mg or complete discontinuation Monitoring patient on telemetry continue home  regimen of apixaban  Chronic diastolic heart failure ([name]) No clinical evidence of cardiogenic volume overload Strict input and output monitoring Daily weights Low-sodium diet   Chronic kidney disease, stage 3b ([name]) Strict intake and output monitoring Creatinine near baseline Minimizing nephrotoxic agents as much as possible Serial chemistries to monitor renal function and electrolytes   Hypothyroidism Resume home regimen of Synthroid         Code Status:  DNR  code status decision has been confirmed with: daughter Family Communication: Daughter has been updated on plan of care via phone conversation  Consults: None  Severity of Illness:  The appropriate patient status for this patient is OBSERVATION. Observation status is judged to be reasonable and necessary in order to provide the required intensity of service to ensure the patient's safety. The patient's presenting symptoms, physical exam findings, and initial radiographic and laboratory data in the context of their medical condition is felt to place them at decreased risk for further clinical deterioration. Furthermore, it is anticipated that the patient will be medically stable for discharge from the hospital within 2 midnights of admission.   Author:  Vernelle Emerald MD  02/01/2022 9:12 AM

## 2022-02-01 NOTE — Care Management (Signed)
Called [name], patient daughter in regards to DC planning and obs notice. Left message for her to call this CM regarding notice for OBS and DC planning

## 2022-02-01 NOTE — Assessment & Plan Note (Addendum)
   Patient presenting with recurrent bouts of syncope  Patient experienced an episode of syncope during her recent hospitalization earlier this month and again yesterday evening  Patient already has a known history of orthostatic hypotension which has likely predisposed her to episodes of orthostatic syncope with episodes seemingly occurring after each occasion after she takes her evening dose of metoprolol.    No evidence of heart block on EKG or telemetry  No clinical evidence of infection  Holding home regimen of metoprolol for now  Gentle intravenous hydration  Patient has already had an echocardiogram in the past several months with preserved ejection fraction and therefore this will not be repeated  Noncontrast CT head unremarkable  PT eval  Troponin pending  Patient may benefit from ongoing use of compression stockings or fludrocortisone prior to discharge

## 2022-02-02 DIAGNOSIS — I951 Orthostatic hypotension: Secondary | ICD-10-CM | POA: Diagnosis not present

## 2022-02-02 LAB — BASIC METABOLIC PANEL
Anion gap: 14 (ref 5–15)
BUN: 18 mg/dL (ref 8–23)
CO2: 22 mmol/L (ref 22–32)
Calcium: 9.3 mg/dL (ref 8.9–10.3)
Chloride: 103 mmol/L (ref 98–111)
Creatinine, Ser: 1.8 mg/dL — ABNORMAL HIGH (ref 0.44–1.00)
GFR, Estimated: 28 mL/min — ABNORMAL LOW (ref >60)
Glucose, Bld: 118 mg/dL — ABNORMAL HIGH (ref 70–99)
Potassium: 4.4 mmol/L (ref 3.5–5.1)
Sodium: 139 mmol/L (ref 135–145)

## 2022-02-02 LAB — GLUCOSE, CAPILLARY
Glucose-Capillary: 118 mg/dL — ABNORMAL HIGH (ref 70–99)
Glucose-Capillary: 109 mg/dL — ABNORMAL HIGH (ref 70–99)
Glucose-Capillary: 105 mg/dL — ABNORMAL HIGH (ref 70–99)

## 2022-02-02 MED ORDER — CHLORHEXIDINE GLUCONATE CLOTH 2 % EX PADS
6.0000 | MEDICATED_PAD | Freq: Every day | CUTANEOUS | Status: DC
Start: 2022-02-02 — End: 2022-02-05
  Administered 2022-02-02 – 2022-02-04 (×3): 6 via TOPICAL

## 2022-02-02 NOTE — TOC Progression Note (Addendum)
Transition of Care [facility]) - Progression Note    Patient Details  Name: Valerie Mcclure MRN: [id number] Date of Birth: 1940-12-04  [facility] CM/SW Contact  Zenon Mayo, RN Phone Number: 02/02/2022, 11:49 AM  Clinical Narrative:    NCM spoke with daughter [name] who patient lives with.  NCM  went over observation notification with her also.  She handed the phone to her other sister Suanne Marker because she wanted to speak with this NCM.  Suanne Marker states patient and her sister will be coming to live with her at her address at [address] [place] [address], because she is unable to remain in her home right now due to infestation with bed bugs and her mobile home will need to be exterminated.  She also states her sister who live with patient has had a stroke.  She states patient is active with [facility] for HHRN,HHPT, HHOT and they would like to continue with this if this is appropriate for patient.   Await pt eval. TOC following.   NCM spoke with [name] he is the HPOA, he will be the one making the decisions for patient.  He thinks she needs some rehab before she goes home. He states she is usually sharp as a tack , she is not usually confused.  Please call son for any decisions. This NCM informed him went over observation form with daughter , he said that is alright.  He states to call him after pt eval to let him know what the recommended.  Son states also the daughter is trying to get guardianship of patient.  Expected Discharge Plan: [place] Barriers to Discharge: Continued Medical Work up  Expected Discharge Plan and Services Expected Discharge Plan: [place]   Discharge Planning Services: CM Consult Post Acute Care Choice: Resumption of Svcs/PTA Provider, Home Health Living arrangements for the past 2 months: Single Family Home                   DME Agency: NA       HH Arranged: RN, PT, OT HH Agency: [place] Date [place]: 01/29/22 Time [place] Agency Contacted: [phone] Representative spoke with at [place]: [place] ([place]) Interventions    Readmission Risk Interventions     No data to display

## 2022-02-02 NOTE — Progress Notes (Signed)
PROGRESS NOTE  Valerie Mcclure  CZY:[id number] DOB: 02/11/1941 DOA: 01/31/2022 PCP: [name], MD   Brief Narrative:  Patient is a 24 female with history of stroke with right-sided spastic hemiplegia, dysarthria, hypertension, hypothyroidism, hyperlipidemia, diastolic CHF, diabetes type 2, orthostatic hypotension, CKD stage IIIb, A-fib on Eliquis who was brought to the hospital by her daughter after she became confused, bradycardic, hypotensive after given a dose of metoprolol.  She was recently discharged from here after she was managed for acute left-sided weakness, dysarthria, MRI/CT angiogram of the head and neck was not consistent with a stroke.  She was also treated for UTI and was discharged home on 7/13.  When EMS arrived, she was hypertensive with systolic blood pressure in the range of 70s. She was hypotensive on arrival here.  CT of the head did not show any acute findings.  Urinalysis with no signs of UTI.  She was admitted for the observation  of hypotension/bradycardia. Pending PT/OT evaluation.  Assessment & Plan:  Principal Problem:   Orthostatic syncope Active Problems:   Acute metabolic encephalopathy   Type 2 diabetes mellitus with stage 3b chronic kidney disease, without long-term current use of insulin ([name])   Mixed diabetic hyperlipidemia associated with type 2 diabetes mellitus ([name])   Chronic diastolic heart failure ([name])   Paroxysmal atrial fibrillation ([name])   Chronic kidney disease, stage 3b ([name])   Hypothyroidism   Syncope/hypotension: History of orthostatic hypotension.  Episode of syncope during recent hospitalization.  She became hypotensive and briefly unresponsive after given metoprolol.  No evidence of heart block on EKG or telemetry.  Started on IV fluids.  Metoprolol on hold.  Recent echo showed preserved ejection fraction.  PT/OT  consulted again. She might benefit with TED hose, midodrine.  We will check She is normotensive at present.    Diabetes type 2: Recent hemoglobin A1c of 5.8.  Monitor blood sugars.  On sliding scale insulin   CKD stage IIIb: Currently in function slightly above baseline.  Baseline creatinine 1.5-1.6.  We will continue to monitor   Confusion: Patient is intermittently confused at baseline but more confused at home.  She has history of dysarthria.  Might have underlying dementia.  CT head/MRI brain  unremarkable.  Recent vitamin B-12, TSH unremarkable.  Ammonia level normal. She remains confused today. Continue monitoring mental status, frequent reorientation, delirium precautions.   Hyperlipidemia: Continue statin   Paroxysmal A-fib: Currently metoprolol on hold.  On Eliquis for anticoagulation.  Rate is controlled.   Chronic systolic CHF: Euvolemic.   Hypothyroidism: Continue Synthyroid  Chronic urinary retention: Came with Foley. Placed on last admit On tamsulosin which has been held for now due to syncope. Might resume on discharge. We might give a voiding trial  Debility/deconditioning: Patient has significantly declined.  She was walking few weeks to months ago.  As per the son, she used to be  alert and oriented most of the time.  She declined after she got infected with COVID.  On her last visit, PT/OT recommend home health.  I think she will benefit by going to skilled nursing facility.     Pressure Injury 01/22/22 Sacrum Right Stage 2 -  Partial thickness loss of dermis presenting as a shallow open injury with a red, pink wound bed without slough. (Active)  01/22/22 0820  Location: Sacrum  Location Orientation: Right  Staging: Stage 2 -  Partial thickness loss of dermis presenting as a shallow open injury with a red, pink wound bed without  slough.  Wound Description (Comments):   Present on Admission: Yes  Dressing Type Foam - Lift dressing to assess site every shift 02/02/22 0900    DVT prophylaxis:apixaban (ELIQUIS) tablet 2.5 mg Start: 02/01/22 1000 apixaban (ELIQUIS) tablet 2.5  mg     Code Status: DNR  Family Communication:: Discussed with son on phone on 7/17  Patient status: Observation  Patient is from : Home  Anticipated discharge to: Home versus skilled nursing facility  Estimated DC date: 1 to 2 days   Consultants: None  Procedures: None  Antimicrobials:  Anti-infectives (From admission, onward)    None       Subjective:  Patient seen and examined the bedside this morning.  Hemodynamically stable.  Heart rate well controlled.  Blood pressure better.  She remains confused.  Looks very weak, deconditioned   Objective: Vitals:   02/01/22 1830 02/01/22 1941 02/01/22 2019 02/01/22 2200  BP: 124/73 131/75  (!) 144/70  Pulse: (!) 118 75 85   Resp: '20 19 19   '$ Temp:  98.2 F (36.8 C) (!) 97.3 F (36.3 C)   TempSrc:   Oral   SpO2: 100% 100% 100%     Intake/Output Summary (Last 24 hours) at 02/02/2022 1102 Last data filed at 02/02/2022 0105 Gross per 24 hour  Intake --  Output 1400 ml  Net -1400 ml   There were no vitals filed for this visit.  Examination:  General exam: Not in distress, chronically looking, weak HEENT: PERRL Respiratory system:  no wheezes or crackles  Cardiovascular system: Irregular irregular rhythm.  Gastrointestinal system: Abdomen is nondistended, soft and nontender. Central nervous system: Alert and oriented Extremities: No edema, no clubbing ,no cyanosis Skin: No rashes, no ulcers,no icterus   GU: Foley   Data Reviewed: I have personally reviewed following labs and imaging studies  CBC: Recent Labs  Lab 01/27/22 0030 01/28/22 0702 01/29/22 0202 01/31/22 2329 02/01/22 0516  WBC 5.1 4.4 4.8 6.9 6.9  NEUTROABS  --  2.3 2.5 3.9 4.5  HGB 8.4* 8.7* 8.8* 9.7* 9.1*  HCT 26.6* 27.2* 28.3* 32.2* 29.0*  MCV 92.0 92.5 93.4 96.7 94.2  PLT 177 187 209 232 161   Basic Metabolic Panel: Recent Labs  Lab 01/27/22 0030 01/28/22 0702 01/29/22 0202 01/31/22 2329 02/01/22 0516 02/02/22 0456  NA 134*  136 134* 137 139 139  K 3.5 4.2 4.0 4.1 3.5 4.4  CL 102 102 105 104 107 103  CO2 '22 23 22 '$ 21* 21* 22  GLUCOSE 140* 94 116* 134* 113* 118*  BUN '14 14 13 18 17 18  '$ CREATININE 1.68* 1.56* 1.46* 1.83* 1.74* 1.80*  CALCIUM 8.3* 8.5* 8.5* 8.9 8.2* 9.3  MG 1.5*  --  2.2  --  1.6*  --   PHOS 3.1  --  2.8  --   --   --      No results found for this or any previous visit (from the past 240 hour(s)).   Radiology Studies: CT Head Wo Contrast  Result Date: 02/01/2022 CLINICAL DATA:  Mental status change, unknown cause. EXAM: CT HEAD WITHOUT CONTRAST TECHNIQUE: Contiguous axial images were obtained from the base of the skull through the vertex without intravenous contrast. RADIATION DOSE REDUCTION: This exam was performed according to the departmental dose-optimization program which includes automated exposure control, adjustment of the mA and/or kV according to patient size and/or use of iterative reconstruction technique. COMPARISON:  Head CT 01/27/2022, MRI brain 01/28/2022. FINDINGS: Brain: Chronic infarcts are again noted  in the left gangliocapsular region and anterior left temporal lobe with encephalomalacia. Background mild atrophy and small-vessel disease with stable ventricular asymmetry due to prior left MCA infarct. No new asymmetry is seen concerning for acute cortical based infarct, hemorrhage or mass effect. Basal cisterns are clear. Vascular: There are patchy calcifications of the carotid siphons but no hyperdense central vessels. Skull: No fracture or focal calvarial lesion. The calvarium, skull base and orbits are intact. Sinuses/Orbits: No acute finding. Other: None. IMPRESSION: No acute intracranial CT findings or interval changes. Old left MCA infarct and additional chronic changes. Electronically Signed   By: Telford Nab M.D.   On: 02/01/2022 01:37   DG Chest Port 1 View  Result Date: 02/01/2022 CLINICAL DATA:  Altered mental status.  Confusion. EXAM: PORTABLE CHEST 1 VIEW  COMPARISON:  01/26/2022 FINDINGS: Stable mild cardiomegaly. Unchanged mediastinal contours. Aortic atherosclerosis. Improved left basilar aeration. No pulmonary edema, confluent consolidation, pleural effusion or pneumothorax. Stable osseous structures. IMPRESSION: Stable mild cardiomegaly. No acute chest findings. Electronically Signed   By: Keith Rake M.D.   On: 02/01/2022 01:12    Scheduled Meds:  apixaban  2.5 mg Oral BID   atorvastatin  20 mg Oral Daily   FLUoxetine  40 mg Oral q AM   insulin aspart  0-15 Units Subcutaneous TID AC & HS   levothyroxine  75 mcg Oral QAC breakfast   nortriptyline  50 mg Oral QHS   sodium chloride flush  3 mL Intravenous Q12H   Continuous Infusions:   LOS: 0 days   Shelly Coss, MD [facility] P7/17/2023, 11:02 AM

## 2022-02-02 NOTE — Care Management Obs Status (Signed)
[place] NOTIFICATION   Patient Details  Name: Valerie Mcclure MRN: [id number] Date of Birth: [birth year]   Medicare Observation Status Notification Given:  Yes    Zenon Mayo, RN 02/02/2022, 11:34 AM

## 2022-02-02 NOTE — Progress Notes (Signed)
Family drama at it's best. At approx 6:15pm pts 2 daughters came to the room and wanted to talk with this nurse, looking for an update. Valerie Mcclure was quite grumpy that no one had called her and this nurse explained that the dr calls the HPOA or the person first listed on contact list and that is your brother Valerie Mcclure. Both daughters rolled their eyes and stated " he is not our brother, he is nothing to [place]"  RN stated not going to get into the family dynamics of it all, just telling you the policy here. RN spoke to the daughter regarding drs note and possibility of starting a new medication to increase blood pressure but it has not been started yet. Again the two sisters rolled their eyes and stated great another medication. I stated to the daughters that the pt worked with PT today and the recommend SNF, [name] stated " of course they did, she's not going to a rehab, she is coming home with [place]" The pt had been pleasant all day and this nurse could observe that the daughters were upsetting the pt. Valerie Mcclure stated that she is trying to get guardianship of her mom and the court date is the end of august.

## 2022-02-02 NOTE — Evaluation (Signed)
Physical Therapy Evaluation Patient Details Name: Valerie Mcclure MRN: [id number] DOB: Jan 26, 1941 Today's Date: 02/02/2022  History of Present Illness  Pt adm 7/16 with recurrent orhtostatic hypotension and syncope and acute metabolic encephalopathy. PMH includes L MCA CVA with residual aphasia and rt weakness, HTN, osteopenia, afib, sacral wound., memory deficits, anemia  Clinical Impression  Pt admitted with above diagnosis and presents to PT with functional limitations due to deficits listed below (See PT problem list). Pt needs skilled PT to maximize independence and safety to allow discharge to SNF for further rehab. Pt appears to have had decline in mobility with her recent frequent hospitalizations. Feel like she can benefit from SNF to work toward more independence and decr burden on caregivers. [place] Office [phone]         Recommendations for follow up therapy are one component of a multi-disciplinary discharge planning process, led by the attending physician.  Recommendations may be updated based on patient status, additional functional criteria and insurance authorization.  Follow Up Recommendations Skilled nursing-short term rehab (<3 hours/day) Can patient physically be transported by private vehicle: No    Assistance Recommended at Discharge Frequent or constant Supervision/Assistance  Patient can return home with the following  A lot of help with walking and/or transfers;A lot of help with bathing/dressing/bathroom;Direct supervision/assist for medications management;Direct supervision/assist for financial management;Assist for transportation;Help with stairs or ramp for entrance    Equipment Recommendations None recommended by PT  Recommendations for Other Services       Functional Status Assessment Patient has had a recent decline in their functional status and demonstrates the ability to make significant improvements in  function in a reasonable and predictable amount of time.     Precautions / Restrictions Precautions Precautions: Fall Precaution Comments: R-sided coordination deficits, incontinent Restrictions Weight Bearing Restrictions: No      Mobility  Bed Mobility Overal bed mobility: Needs Assistance Bed Mobility: Supine to Sit, Sit to Supine, Rolling Rolling: Min assist, Mod assist (min assist toward lt and mod assist toward rt)   Supine to sit: Mod assist Sit to supine: Mod assist   General bed mobility comments: Assist to bring legs off of bed, elevate trunk into sitting and bring hips to eob. Assist to bring legs back up into bed returning to supine    Transfers Overall transfer level: Needs assistance Equipment used: Rolling walker (2 wheels) Transfers: Sit to/from Stand Sit to Stand: Mod assist           General transfer comment: Assist to bring hips up and for balance. Pt with difficulty keeping RUE on  walker.    Ambulation/Gait                  Stairs            Wheelchair Mobility    Modified Rankin (Stroke Patients Only)       Balance Overall balance assessment: Needs assistance Sitting-balance support: No upper extremity supported, Feet supported Sitting balance-Leahy Scale: Fair     Standing balance support: During functional activity, Reliant on assistive device for balance, Single extremity supported Standing balance-Leahy Scale: Poor Standing balance comment: walker with only LUE on walker and mod assist for static standing                             Pertinent Vitals/Pain Pain Assessment Pain Assessment: Faces Faces Pain Scale: Hurts a little bit Pain  Location: all over Pain Descriptors / Indicators: Aching Pain Intervention(s): Limited activity within patient's tolerance, Monitored during session, Repositioned    Home Living Family/patient expects to be discharged to:: Private residence Living Arrangements:  Children Available Help at Discharge: Family;Available 24 hours/day Type of Home: Mobile home Home Access: Ramped entrance       Home Layout: One level Home Equipment: Conservation officer, nature (2 wheels);Wheelchair - manual;Cane - single point;BSC/3in1      Prior Function Prior Level of Function : Needs assist;Patient poor historian/Family not available       Physical Assist : Mobility (physical);ADLs (physical) Mobility (physical): Bed mobility;Transfers;Gait   Mobility Comments: Pt has been requiring assist of family for mobility and has been primarily at [facility] level, was amb short distance with assist at prior admission       Hand Dominance   Dominant Hand: Right    Extremity/Trunk Assessment   Upper Extremity Assessment Upper Extremity Assessment: Defer to OT evaluation    Lower Extremity Assessment Lower Extremity Assessment: RLE deficits/detail;Generalized weakness RLE Deficits / Details: strength grossly 2+ to 3-/5 RLE Coordination: decreased gross motor;decreased fine motor       Communication   Communication: Expressive difficulties  Cognition Arousal/Alertness: Awake/alert Behavior During Therapy: WFL for tasks assessed/performed Overall Cognitive Status: Difficult to assess                                 General Comments: Followed most 1 step commands with incr time        General Comments      Exercises     Assessment/Plan    PT Assessment Patient needs continued PT services  PT Problem List Decreased strength;Decreased activity tolerance;Decreased balance;Decreased mobility;Decreased cognition       PT Treatment Interventions DME instruction;Gait training;Functional mobility training;Therapeutic exercise;Therapeutic activities;Balance training;Patient/family education    PT Goals (Current goals can be found in the Care Plan section)  Acute Rehab PT Goals Patient Stated Goal: not stated PT Goal Formulation: Patient unable to  participate in goal setting Time For Goal Achievement: 02/16/22 Potential to Achieve Goals: Fair    Frequency Min 3X/week     Co-evaluation               AM-PAC PT "6 Clicks" Mobility  Outcome Measure Help needed turning from your back to your side while in a flat bed without using bedrails?: A Lot Help needed moving from lying on your back to sitting on the side of a flat bed without using bedrails?: A Lot Help needed moving to and from a bed to a chair (including a wheelchair)?: Total Help needed standing up from a chair using your arms (e.g., wheelchair or bedside chair)?: A Lot Help needed to walk in hospital room?: Total Help needed climbing 3-5 steps with a railing? : Total 6 Click Score: 9    End of Session Equipment Utilized During Treatment: Gait belt Activity Tolerance: Other (comment) (Limited by incontinent of stool) Patient left: in bed;with call bell/phone within reach;with bed alarm set   PT Visit Diagnosis: Other abnormalities of gait and mobility (R26.89);Muscle weakness (generalized) (M62.81);Hemiplegia and hemiparesis Hemiplegia - Right/Left: Right Hemiplegia - dominant/non-dominant: Dominant Hemiplegia - caused by: Cerebral infarction (chronic from prior CVA)    Time: 5397-6734 PT Time Calculation (min) (ACUTE ONLY): 35 min   Charges:   PT Evaluation $PT Eval Moderate Complexity: 1 Mod PT Treatments $Therapeutic Activity: 8-22 mins  [place] Office [place] 02/02/2022, 6:21 PM

## 2022-02-03 DIAGNOSIS — I951 Orthostatic hypotension: Secondary | ICD-10-CM | POA: Diagnosis not present

## 2022-02-03 LAB — GLUCOSE, CAPILLARY
Glucose-Capillary: 106 mg/dL — ABNORMAL HIGH (ref 70–99)
Glucose-Capillary: 96 mg/dL (ref 70–99)
Glucose-Capillary: 91 mg/dL (ref 70–99)
Glucose-Capillary: 112 mg/dL — ABNORMAL HIGH (ref 70–99)

## 2022-02-03 LAB — BASIC METABOLIC PANEL
Anion gap: 11 (ref 5–15)
BUN: 17 mg/dL (ref 8–23)
CO2: 22 mmol/L (ref 22–32)
Calcium: 8.8 mg/dL — ABNORMAL LOW (ref 8.9–10.3)
Chloride: 107 mmol/L (ref 98–111)
Creatinine, Ser: 1.75 mg/dL — ABNORMAL HIGH (ref 0.44–1.00)
GFR, Estimated: 29 mL/min — ABNORMAL LOW (ref >60)
Glucose, Bld: 93 mg/dL (ref 70–99)
Potassium: 4.2 mmol/L (ref 3.5–5.1)
Sodium: 140 mmol/L (ref 135–145)

## 2022-02-03 MED ORDER — NORTRIPTYLINE HCL 10 MG PO CAPS
30.0000 mg | ORAL_CAPSULE | Freq: Every day | ORAL | Status: DC
  Administered 2022-02-03 – 2022-02-04 (×2): 30 mg via ORAL
  Filled 2022-02-03 (×3): qty 3

## 2022-02-03 MED ORDER — NORTRIPTYLINE HCL 10 MG PO CAPS: 40.0000 mg | ORAL_CAPSULE | Freq: Every day | ORAL | Status: DC

## 2022-02-03 MED ORDER — MIDODRINE HCL 5 MG PO TABS
5.0000 mg | ORAL_TABLET | Freq: Three times a day (TID) | ORAL | Status: DC
  Administered 2022-02-03 – 2022-02-05 (×6): 5 mg via ORAL
  Filled 2022-02-03 (×6): qty 1

## 2022-02-03 NOTE — Progress Notes (Signed)
OT Cancellation Note  Patient Details Name: Valerie Mcclure MRN: [id number] DOB: 05-Apr-1941   Cancelled Treatment:    Reason Eval/Treat Not Completed: Patient declined, no reason specified (Spoke to nursing and pt declining all care at this time.) Will continue to follow.  Joeseph Amor OTR/L  Acute Rehab Services  [phone] office number [phone] pager number   Joeseph Amor 02/03/2022, 10:38 AM

## 2022-02-03 NOTE — Progress Notes (Signed)
OT Cancellation Note  Patient Details Name: CHARLCIE PRISCO MRN: [id number] DOB: 07/28/1940   Cancelled Treatment:    Reason Eval/Treat Not Completed: Fatigue/lethargy limiting ability to participate (Pt hypotensive, BP in supine 86/71. Pt also decling at this time.)  Joeseph Amor OTR/L  Acute Rehab Services  [phone] office number [phone] pager number   Joeseph Amor 02/03/2022, 7:50 AM

## 2022-02-03 NOTE — TOC Progression Note (Signed)
Transition of Care [facility]) - Progression Note    Patient Details  Name: Valerie Mcclure MRN: [id number] Date of Birth: 10/20/1940  Transition of Care [facility]) CM/SW [place], [place] Phone Number: 02/03/2022, 12:09 PM  Clinical Narrative:    CSW was notified that there were family dynamics between the patients step son and daughters. Son is listed to be HCPOA, but there is no documentation on file. Son noted the doctors office was also supposed to have it on file. CSW called over to [place], they do not have it on file. Son stated he would get CSW the lawyer information to see if it can be sent that way. HCPOA has been acquired, a hard copy will be placed in the chart and sent with her. Son would like for pt to go to SNF, per the recommendation, and daughters would like for her to go home with them. Son states they have a guardianship hearing in August, to determine how to move forward. Son asks that we do not share discharge information at this time.  CSW was notified that daughter [name] needed to speak with CSW about discharge plans and was repeatedly calling pt's nurse. CSW called [name] and advised her that at this time we are legally obligated to move forward with pt's HCPOA, and honor those decisions. CSW advised her that at this time, staff will not be discussing DC plans with anyone, but HCPOA. Pt's daughter became verbally abusive and CSW advised her this conversation would not be continued if it could not be had civilly. Daughter claiming that HCPOA paperwork is fraudulent, upon review it has been completed correctly. Daughter continues to raise voice and then hangs up on CSW. CSW updated medical team to plans and concerns. TOC will continue to follow for DC needs.   Expected Discharge Plan: [place] Barriers to Discharge: Continued Medical Work up  Expected Discharge Plan and Services Expected Discharge Plan: [place]   Discharge  Planning Services: CM Consult Post Acute Care Choice: Resumption of Svcs/PTA Provider, Home Health Living arrangements for the past 2 months: Single Family Home                   DME Agency: NA       HH Arranged: RN, PT, OT HH Agency: [place] Date [place]: 01/29/22 Time [place] Agency Contacted: 1440 Representative spoke with at [place]: [place] ([place]) Interventions    Readmission Risk Interventions     No data to display

## 2022-02-03 NOTE — NC FL2 (Signed)
[place] LEVEL OF CARE SCREENING TOOL     IDENTIFICATION  Patient Name: Valerie Mcclure Birthdate: [birth year] Sex: female Admission Date (Current Location): 01/31/2022  [facility] and [place] Number:  Herbalist and Address:  [facility] [place], [place] [address] [place], [place] [address]      Provider Number: [id number]  Attending Physician Name and Address:  Shelly Coss, MD  Relative Name and Phone Number:  Turkessa Ostrom, [phone]    Current Level of Care: Hospital Recommended Level of Care: [place] Prior Approval Number:    Date Approved/Denied:   PASRR Number: [id number]  Discharge Plan: SNF    Current Diagnoses: Patient Active Problem List   Diagnosis Date Noted   Mixed diabetic hyperlipidemia associated with type 2 diabetes mellitus ([place]) 02/01/2022   Type 2 diabetes mellitus with stage 3b chronic kidney disease, without long-term current use of insulin ([place]) 02/01/2022   Right Parotid Lesion 01/29/2022   Sepsis secondary to UTI ([place]) 01/26/2022   Citrobacter infection 01/26/2022   Acute urinary retention 01/26/2022   Left knee pain 76/19/5093   Acute metabolic encephalopathy 26/71/2458   Stroke-like symptoms 01/21/2022   Hypothermia 01/21/2022   UTI (urinary tract infection) 01/08/2022   Vaso vagal episode 01/02/2022   General weakness    Skin ulcer of sacrum ([place])    Metabolic acidosis 09/98/3382   Normocytic anemia 12/11/2021   Thrombocytopenia ([place]) 12/11/2021   Memory deficit 12/11/2021   Orthostatic syncope 12/09/2021   Stroke-like symptom 12/09/2021   SIRS (systemic inflammatory response syndrome) ([place]) 12/09/2021   Orthostatic hypotension 12/09/2021   Leg ulcer, left ([place]) 10/13/2021   Eye tearing, left 02/19/2020   TMJ syndrome, left 02/19/2020   Lump in neck 06/08/2018   Sleep difficulties 03/31/2018   Persistent atrial fibrillation ([place]) 09/06/2017   TIA (transient  ischemic attack) 07/12/2017   Bradycardia 04/12/2017   Depression 01/29/2017   Fall    Aphasia as late effect of cerebrovascular accident (CVA)    Paroxysmal atrial fibrillation ([name])    Chronic diastolic heart failure ([name])    Dysarthria    Atrial flutter ([place]) 12/22/2016   Spastic hemiplegia of right dominant side as late effect of cerebral infarction ([place])    Anemia of chronic disease    Hypertension    Dysphagia, post-stroke    Anterior cerebral circulation hemorrhagic infarction ([place]) 12/03/2016   Right hemiparesis ([place])    Nontraumatic subcortical hemorrhage of left cerebral hemisphere ([name])    Mild aortic regurgitation 08/20/2016   Bilateral edema of lower extremity 04/07/2016   Pancreatic duct dilated 01/09/2016   Chronic kidney disease, stage 3b ([place]) 12/25/2015   Mild tricuspid regurgitation 12/25/2015   Mild mitral regurgitation 12/25/2015   Diabetes mellitus without complication ([place]) 50/53/9767   Abdominal pain, chronic, right upper quadrant 06/04/2015   Chronic low back pain    Pain in thoracic spine 04/18/2010   Coronary atherosclerosis 01/24/2009   ARTHRITIS, LEFT KNEE 12/13/2008   Hypothyroidism 11/21/2007   Hyperlipidemia 11/09/2007   Osteopenia 11/09/2007    Orientation RESPIRATION BLADDER Height & Weight     Self  Normal Continent, External catheter Weight:   Height:     BEHAVIORAL SYMPTOMS/MOOD NEUROLOGICAL BOWEL NUTRITION STATUS      Continent Diet (See DC summary)  AMBULATORY STATUS COMMUNICATION OF NEEDS Skin   Total Care Verbally PU Stage and Appropriate Care (PU Stage 2 on Sacrum)  Personal Care Assistance Level of Assistance  Bathing, Feeding, Dressing Bathing Assistance: Maximum assistance Feeding assistance: Limited assistance Dressing Assistance: Maximum assistance     Functional Limitations Info  Sight, Hearing, Speech Sight Info: Impaired Hearing Info: Adequate Speech Info: Adequate    SPECIAL CARE  FACTORS FREQUENCY  PT (By licensed PT), OT (By licensed OT)     PT Frequency: 5x week OT Frequency: 5x week            Contractures Contractures Info: Not present    Additional Factors Info  Code Status, Allergies, Psychotropic, Insulin Sliding Scale Code Status Info: DNR Allergies Info: Clonazepam   Shrimp (Shellfish Allergy)   Valsartan   Tandem Plus (Fefum-fepo-fa-b [id number]-[id number]-cu)   Ivp Dye (Iodinated Contrast Media)   Penicillins Psychotropic Info: Fluoxetine Insulin Sliding Scale Info: Insulin Aspart (Novolog) 0-15 U 3x daily w/ meals and @ bedtime       Current Medications (02/03/2022):  This is the current hospital active medication list Current Facility-Administered Medications  Medication Dose Route Frequency Provider Last Rate Last Admin   acetaminophen (TYLENOL) tablet 650 mg  650 mg Oral Q6H PRN Shalhoub, Sherryll Burger, MD       Or   acetaminophen (TYLENOL) suppository 650 mg  650 mg Rectal Q6H PRN Shalhoub, Sherryll Burger, MD       apixaban [facility]ne [place]) tablet 2.5 mg  2.5 mg Oral BID Vernelle Emerald, MD   2.5 mg at 02/02/22 2224   atorvastatin (LIPITOR) tablet 20 mg  20 mg Oral Daily Shalhoub, Sherryll Burger, MD   20 mg at 02/02/22 0908   Chlorhexidine Gluconate Cloth 2 % PADS 6 each  6 each Topical Daily Shelly Coss, MD   6 each at 02/03/22 1207   FLUoxetine (PROZAC) capsule 40 mg  40 mg Oral q AM Shalhoub, Sherryll Burger, MD   40 mg at 02/02/22 0605   insulin aspart (novoLOG) injection 0-15 Units  0-15 Units Subcutaneous TID AC & HS Shalhoub, Sherryll Burger, MD       levothyroxine (SYNTHROID) tablet 75 mcg  75 mcg Oral QAC breakfast Vernelle Emerald, MD   75 mcg at 02/02/22 0908   midodrine (PROAMATINE) tablet 5 mg  5 mg Oral TID WC Adhikari, Amrit, MD       nortriptyline (PAMELOR) capsule 30 mg  30 mg Oral QHS Shelly Coss, MD       ondansetron (ZOFRAN) tablet 4 mg  4 mg Oral Q6H PRN Shalhoub, Sherryll Burger, MD       Or   ondansetron [facility]) injection 4 mg  4 mg Intravenous Q6H PRN  Shalhoub, Sherryll Burger, MD       polyethylene glycol (MIRALAX / GLYCOLAX) packet 17 g  17 g Oral Daily PRN Shalhoub, Sherryll Burger, MD       sodium chloride flush (NS) 0.9 % injection 3 mL  3 mL Intravenous Q12H Shalhoub, Sherryll Burger, MD   3 mL at 02/03/22 0831     Discharge Medications: Please see discharge summary for a list of discharge medications.  Relevant Imaging Results:  Relevant Lab Results:   Additional Information SS#: [id number]  [place], [place]

## 2022-02-03 NOTE — Progress Notes (Addendum)
PROGRESS NOTE  Valerie Mcclure  OFH:[id number] DOB: June 19, 1941 DOA: 01/31/2022 PCP: [name], MD   Brief Narrative:  Patient is a 74 female with history of stroke with right-sided spastic hemiplegia, dysarthria, hypertension, hypothyroidism, hyperlipidemia, diastolic CHF, diabetes type 2, orthostatic hypotension, CKD stage IIIb, A-fib on Eliquis who was brought to the hospital by her daughter after she became confused, bradycardic, hypotensive after given a dose of metoprolol.  She was recently discharged from here after she was managed for acute left-sided weakness, dysarthria, MRI/CT angiogram of the head and neck was not consistent with a stroke.  She was also treated for UTI and was discharged home on 7/13.  When EMS arrived, she was hypotensive with systolic blood pressure in the range of 70s. She was hypotensive on arrival here.  CT of the head did not show any acute findings.  Urinalysis with no signs of UTI.  She was admitted for the observation  of hypotension/bradycardia. PT/OT recommending SNF on discharge.  Medically stable for discharge whenever possible  Assessment & Plan:  Principal Problem:   Orthostatic syncope Active Problems:   Acute metabolic encephalopathy   Type 2 diabetes mellitus with stage 3b chronic kidney disease, without long-term current use of insulin ([name])   Mixed diabetic hyperlipidemia associated with type 2 diabetes mellitus ([name])   Chronic diastolic heart failure ([name])   Paroxysmal atrial fibrillation ([name])   Chronic kidney disease, stage 3b ([name])   Hypothyroidism   Syncope/hypotension: History of orthostatic hypotension.  Episode of syncope during recent hospitalization.  She became hypotensive and briefly unresponsive after given metoprolol.  No evidence of heart block on EKG or telemetry.  Started on IV fluids, now stopped.  Metoprolol on hold.  Recent echo showed preserved ejection fraction.  PT/OT  consulted again, recommended skilled nursing  facility. She might benefit with TED hose, midodrine.  Blood pressure acceptable  Diabetes type 2: Recent hemoglobin A1c of 5.8.  Monitor blood sugars.  On sliding scale insulin   CKD stage IIIb: Currently kidney function around baseline.  Baseline creatinine 1.5-1.6.  We will continue to monitor   Confusion: Patient is intermittently confused at baseline but more confused at home.  She has history of dysarthria.  Might have underlying dementia.  CT head/MRI brain  unremarkable.  Recent vitamin B-12, TSH unremarkable.  Ammonia level normal. She remains oriented to place today Continue monitoring mental status, frequent reorientation, delirium precautions. Dose of nortriptyline reduced after discussion with pharmacy.   Hyperlipidemia: Continue statin   Paroxysmal A-fib: Currently metoprolol on hold.  On Eliquis for anticoagulation.  Rate is controlled.   Chronic systolic CHF: Euvolemic.  On torsemide 10 mg at home, now on hold due to hypotension, orthostasis.  Can be resumed when appropriate   Hypothyroidism: Continue Synthyroid  Chronic urinary retention: Came with Foley. Placed on last admit On tamsulosin which has been held for now due to syncope.  Will give voiding trial.  Debility/deconditioning: Patient has significantly declined.  She was walking few weeks to months ago.  As per the son, she used to be  alert and oriented most of the time.  She declined after she got infected with COVID.  On her last visit, PT/OT recommend home health.  PT/OT recommending/facility on discharge.  Son is agreeable with that.  He is the healthcare power of attorney.    Pressure Injury 01/22/22 Sacrum Right Stage 2 -  Partial thickness loss of dermis presenting as a shallow open injury with a red, pink  wound bed without slough. (Active)  01/22/22 0820  Location: Sacrum  Location Orientation: Right  Staging: Stage 2 -  Partial thickness loss of dermis presenting as a shallow open injury with a red, pink  wound bed without slough.  Wound Description (Comments):   Present on Admission: Yes  Dressing Type Foam - Lift dressing to assess site every shift 02/03/22 0800    DVT prophylaxis:apixaban (ELIQUIS) tablet 2.5 mg Start: 02/01/22 1000 apixaban (ELIQUIS) tablet 2.5 mg     Code Status: DNR  Family Communication:: Discussed with son on phone on 7/17  Patient status: Observation  Patient is from : Home  Anticipated discharge to:  skilled nursing facility  Estimated DC date: 1 to 2 days   Consultants: None  Procedures: None  Antimicrobials:  Anti-infectives (From admission, onward)    None       Subjective:  Patient seen and examined at the bedside this morning.  Her mental status has improved and she knows that she is in the hospital.  She is not oriented to time.  Not in any Distress.  Blood pressure is soft but stable Objective: Vitals:   02/02/22 1935 02/03/22 0043 02/03/22 0652 02/03/22 0734  BP: (!) 114/57 (!) 137/42 (!) 82/35 (!) 98/52  Pulse: 90 86 87 66  Resp: '20 20 20 14  '$ Temp: 97.7 F (36.5 C) 98.9 F (37.2 C)  98.2 F (36.8 C)  TempSrc: Oral Axillary  Oral  SpO2: 99%   96%    Intake/Output Summary (Last 24 hours) at 02/03/2022 1136 Last data filed at 02/03/2022 0051 Gross per 24 hour  Intake --  Output 400 ml  Net -400 ml   There were no vitals filed for this visit.  Examination:  General exam: Overall comfortable, not in distress, chronically ill looking, weak HEENT: PERRL Respiratory system:  no wheezes or crackles  Cardiovascular system: S1 & S2 heard, RRR.  Gastrointestinal system: Abdomen is nondistended, soft and nontender. Central nervous system: Alert and awake, oriented to place only Extremities: No edema, no clubbing ,no cyanosis Skin: No rashes, no ulcers,no icterus   GU: Foley   Data Reviewed: I have personally reviewed following labs and imaging studies  CBC: Recent Labs  Lab 01/28/22 0702 01/29/22 0202 01/31/22 2329  02/01/22 0516  WBC 4.4 4.8 6.9 6.9  NEUTROABS 2.3 2.5 3.9 4.5  HGB 8.7* 8.8* 9.7* 9.1*  HCT 27.2* 28.3* 32.2* 29.0*  MCV 92.5 93.4 96.7 94.2  PLT 187 209 232 010   Basic Metabolic Panel: Recent Labs  Lab 01/29/22 0202 01/31/22 2329 02/01/22 0516 02/02/22 0456 02/03/22 0911  NA 134* 137 139 139 140  K 4.0 4.1 3.5 4.4 4.2  CL 105 104 107 103 107  CO2 22 21* 21* 22 22  GLUCOSE 116* 134* 113* 118* 93  BUN '13 18 17 18 17  '$ CREATININE 1.46* 1.83* 1.74* 1.80* 1.75*  CALCIUM 8.5* 8.9 8.2* 9.3 8.8*  MG 2.2  --  1.6*  --   --   PHOS 2.8  --   --   --   --      No results found for this or any previous visit (from the past 240 hour(s)).   Radiology Studies: No results found.  Scheduled Meds:  apixaban  2.5 mg Oral BID   atorvastatin  20 mg Oral Daily   Chlorhexidine Gluconate Cloth  6 each Topical Daily   FLUoxetine  40 mg Oral q AM   insulin aspart  0-15 Units  Subcutaneous TID AC & HS   levothyroxine  75 mcg Oral QAC breakfast   midodrine  5 mg Oral TID WC   nortriptyline  30 mg Oral QHS   sodium chloride flush  3 mL Intravenous Q12H   Continuous Infusions:   LOS: 0 days   Shelly Coss, MD [facility] P7/18/2023, 11:36 AM

## 2022-02-04 DIAGNOSIS — I951 Orthostatic hypotension: Secondary | ICD-10-CM | POA: Diagnosis not present

## 2022-02-04 LAB — GLUCOSE, CAPILLARY
Glucose-Capillary: 112 mg/dL — ABNORMAL HIGH (ref 70–99)
Glucose-Capillary: 106 mg/dL — ABNORMAL HIGH (ref 70–99)
Glucose-Capillary: 140 mg/dL — ABNORMAL HIGH (ref 70–99)
Glucose-Capillary: 141 mg/dL — ABNORMAL HIGH (ref 70–99)

## 2022-02-04 MED ORDER — METOPROLOL TARTRATE 25 MG PO TABS: 12.5000 mg | ORAL_TABLET | Freq: Two times a day (BID) | ORAL | Status: DC

## 2022-02-04 MED ORDER — NORTRIPTYLINE HCL 10 MG PO CAPS: 30.0000 mg | ORAL_CAPSULE | Freq: Every day | ORAL | Status: DC

## 2022-02-04 MED ORDER — METOPROLOL TARTRATE 12.5 MG HALF TABLET
12.5000 mg | ORAL_TABLET | Freq: Two times a day (BID) | ORAL | Status: DC
  Administered 2022-02-04 – 2022-02-05 (×3): 12.5 mg via ORAL
  Filled 2022-02-04 (×3): qty 1

## 2022-02-04 MED ORDER — MIDODRINE HCL 5 MG PO TABS: 5.0000 mg | ORAL_TABLET | Freq: Three times a day (TID) | ORAL | Status: AC

## 2022-02-04 NOTE — Progress Notes (Signed)
Physical Therapy Treatment Patient Details Name: Valerie Mcclure MRN: [id number] DOB: May 30, 1941 Today's Date: 02/04/2022   History of Present Illness Pt adm 7/16 with recurrent orhtostatic hypotension and syncope and acute metabolic encephalopathy. PMH includes L MCA CVA with residual aphasia and rt weakness, HTN, osteopenia, afib, sacral wound., memory deficits, anemia    PT Comments    Pt with significant improvement with mobility. Continue to recommend SNF for further therapy to maximize her independence. Family dynamics issues.    Recommendations for follow up therapy are one component of a multi-disciplinary discharge planning process, led by the attending physician.  Recommendations may be updated based on patient status, additional functional criteria and insurance authorization.  Follow Up Recommendations  Skilled nursing-short term rehab (<3 hours/day) Can patient physically be transported by private vehicle: No   Assistance Recommended at Discharge Frequent or constant Supervision/Assistance  Patient can return home with the following A lot of help with bathing/dressing/bathroom;Direct supervision/assist for medications management;Direct supervision/assist for financial management;Assist for transportation;Help with stairs or ramp for entrance;A little help with walking and/or transfers   Equipment Recommendations  None recommended by PT    Recommendations for Other Services       Precautions / Restrictions Precautions Precautions: Fall Precaution Comments: R-sided coordination deficits, incontinent Restrictions Weight Bearing Restrictions: No     Mobility  Bed Mobility Overal bed mobility: Needs Assistance Bed Mobility: Supine to Sit     Supine to sit: Mod assist     General bed mobility comments: Min assist to elevate trunk into sitting and mod assist to bring rt hip to EOB.    Transfers Overall transfer level: Needs assistance Equipment used: Rolling  walker (2 wheels) Transfers: Sit to/from Stand Sit to Stand: Mod assist           General transfer comment: Assist to bring hips up and for balance. Assist to place rt hand on walker. Once placed rt hand stayed on walker.    Ambulation/Gait Ambulation/Gait assistance: Min assist Gait Distance (Feet): 60 Feet Assistive device: Rolling walker (2 wheels) Gait Pattern/deviations: Step-through pattern, Decreased stride length, Trunk flexed, Drifts right/left Gait velocity: decr Gait velocity interpretation: <1.31 ft/sec, indicative of household ambulator   General Gait Details: Assist for balance and support and intermittent assist to steer walker   Stairs             Wheelchair Mobility    Modified Rankin (Stroke Patients Only)       Balance Overall balance assessment: Needs assistance Sitting-balance support: No upper extremity supported, Feet supported Sitting balance-Leahy Scale: Fair     Standing balance support: During functional activity, Reliant on assistive device for balance, Bilateral upper extremity supported Standing balance-Leahy Scale: Poor Standing balance comment: walker and min assist for static standing                            Cognition Arousal/Alertness: Awake/alert Behavior During Therapy: WFL for tasks assessed/performed Overall Cognitive Status: No family/caregiver present to determine baseline cognitive functioning                                 General Comments: Followed most 1 step commands with incr time        Exercises      General Comments        Pertinent Vitals/Pain Pain Assessment Pain Assessment: No/denies pain    Home  Living                          Prior Function            PT Goals (current goals can now be found in the care plan section) Acute Rehab PT Goals Patient Stated Goal: not stated Progress towards PT goals: Progressing toward goals    Frequency    Min  3X/week      PT Plan Current plan remains appropriate    Co-evaluation              AM-PAC PT "6 Clicks" Mobility   Outcome Measure  Help needed turning from your back to your side while in a flat bed without using bedrails?: A Lot Help needed moving from lying on your back to sitting on the side of a flat bed without using bedrails?: A Lot Help needed moving to and from a bed to a chair (including a wheelchair)?: A Lot Help needed standing up from a chair using your arms (e.g., wheelchair or bedside chair)?: A Lot Help needed to walk in hospital room?: A Little Help needed climbing 3-5 steps with a railing? : Total 6 Click Score: 12    End of Session Equipment Utilized During Treatment: Gait belt Activity Tolerance: Patient tolerated treatment well Patient left: with call bell/phone within reach;in chair;with chair alarm set Nurse Communication: Mobility status PT Visit Diagnosis: Other abnormalities of gait and mobility (R26.89);Muscle weakness (generalized) (M62.81);Hemiplegia and hemiparesis Hemiplegia - Right/Left: Right Hemiplegia - dominant/non-dominant: Dominant Hemiplegia - caused by: Cerebral infarction (chronic from prior CVA)     Time: 0910-0925 PT Time Calculation (min) (ACUTE ONLY): 15 min  Charges:  $Gait Training: 8-22 mins                     [place] 02/04/2022, 10:12 AM

## 2022-02-04 NOTE — Progress Notes (Signed)
2137: Patient's daughter [name]) called for update. Called back but no answer.

## 2022-02-04 NOTE — TOC Progression Note (Addendum)
Transition of Care [facility]) - Progression Note    Patient Details  Name: Valerie Mcclure MRN: [id number] Date of Birth: September 17, 1940  Transition of Care [facility]) CM/SW [place], [place] Phone Number: 02/04/2022, 10:31 AM  Clinical Narrative:    4:30 CSW followed up with [facility] who stated the medical director had not reviewed pt's case yet and they would follow up tomorrow. Medical team notified.  CSW followed up with son this morning, he was advised that [facility] hes declined, and given his bed offers. Son is agreeable to [place]. CSW notified facility and started authorization for SNF and ambulance transport. MD advised pt is medically ready when [facility]m [facility] is approved. TOC will continue to follow for DC needs.   Expected Discharge Plan: [place] Barriers to Discharge: Continued Medical Work up  Expected Discharge Plan and Services Expected Discharge Plan: [place]   Discharge Planning Services: CM Consult Post Acute Care Choice: Resumption of Svcs/PTA Provider, Home Health Living arrangements for the past 2 months: Single Family Home                   DME Agency: NA       HH Arranged: RN, PT, OT HH Agency: [place] Date [place]: 01/29/22 Time [place] Agency Contacted: 1440 Representative spoke with at [place]: [place] ([place]) Interventions    Readmission Risk Interventions     No data to display

## 2022-02-04 NOTE — Evaluation (Signed)
Occupational Therapy Evaluation Patient Details Name: Valerie Mcclure MRN: [id number] DOB: 03-11-1941 Today's Date: 02/04/2022   History of Present Illness Pt adm 7/16 with recurrent orhtostatic hypotension and syncope and acute metabolic encephalopathy. 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 Pt currently, Pt limited by decreased cognition, decreased strength, decreased activity tolerance and decreased ability to care for self. Pt following 1 step commands, but not oriented to situation, only self. Pt overall set-upA to totalA for ADL tasks. Pt modA overall for sit to stands with RW. Pt would benefit from continued OT skilled services. OT following acutely.     Recommendations for follow up therapy are one component of a multi-disciplinary discharge planning process, led by the attending physician.  Recommendations may be updated based on patient status, additional functional criteria and insurance authorization.   Follow Up Recommendations  Skilled nursing-short term rehab (<3 hours/day)    Assistance Recommended at Discharge Frequent or constant Supervision/Assistance  Patient can return home with the following A lot of help with bathing/dressing/bathroom;Assistance with cooking/housework;Direct supervision/assist for financial management;Assist for transportation;Help with stairs or ramp for entrance;Direct supervision/assist for medications management;A lot of help with walking and/or transfers    Functional Status Assessment  Patient has had a recent decline in their functional status and demonstrates the ability to make significant improvements in function in a reasonable and predictable amount of time.  Equipment Recommendations  Tub/shower bench    Recommendations for Other Services        Precautions / Restrictions Precautions Precautions: Fall Precaution Comments: R-sided coordination deficits, incontinent Restrictions Weight Bearing Restrictions: No      Mobility Bed Mobility               General bed mobility comments: in recliner pre and post session    Transfers Overall transfer level: Needs assistance Equipment used: Rolling walker (2 wheels) Transfers: Sit to/from Stand Sit to Stand: Mod assist           General transfer comment: cues to scoot to edge of chair      Balance Overall balance assessment: Needs assistance Sitting-balance support: No upper extremity supported, Feet supported Sitting balance-Leahy Scale: Fair     Standing balance support: During functional activity, Reliant on assistive device for balance, Bilateral upper extremity supported Standing balance-Leahy Scale: Poor Standing balance comment: walker and min assist for static standing                           ADL either performed or assessed with clinical judgement   ADL Overall ADL's : Needs assistance/impaired Eating/Feeding: Minimal assistance;Sitting   Grooming: Minimal assistance;Sitting   Upper Body Bathing: Minimal assistance;Sitting   Lower Body Bathing: Maximal assistance;Sitting/lateral leans;Sit to/from stand   Upper Body Dressing : Minimal assistance;Sitting   Lower Body Dressing: Maximal assistance;Sitting/lateral leans;Sit to/from stand   Toilet Transfer: Moderate assistance;BSC/3in1   Toileting- Clothing Manipulation and Hygiene: Total assistance;Sit to/from stand Toileting - Clothing Manipulation Details (indicate cue type and reason): incontinent     Functional mobility during ADLs: Moderate assistance;Rolling walker (2 wheels);Cueing for safety;Cueing for sequencing General ADL Comments: Pt limited by decreased cognition, decreased strength, decreased activity tolerance and decreased ability to care for self. Pt following 1  step commands, but not oriented  to situation, only self.  Pt overall set-upA to totalA for ADL tasks.     Vision Baseline Vision/History: 0 No visual deficits Ability to See in Adequate Light: 0 Adequate Patient Visual Report: No change from baseline Vision Assessment?: No apparent visual deficits     Perception     Praxis      Pertinent Vitals/Pain Pain Assessment Pain Assessment: Faces Faces Pain Scale: Hurts little more Breathing: normal Negative Vocalization: none Facial Expression: smiling or inexpressive Body Language: relaxed Consolability: no need to console PAINAD Score: 0 Pain Location: all over Pain Descriptors / Indicators: Aching Pain Intervention(s): Monitored during session     Hand Dominance Right   Extremity/Trunk Assessment Upper Extremity Assessment Upper Extremity Assessment: Generalized weakness RUE Deficits / Details: deficits from prior CVA; limited ROM at shoulder; elbow wrist and hand 2/5 strength LUE Deficits / Details: 4/5 strength   Lower Extremity Assessment Lower Extremity Assessment: Defer to PT evaluation;Generalized weakness   Cervical / Trunk Assessment Cervical / Trunk Assessment: Kyphotic   Communication Communication Communication: Expressive difficulties   Cognition Arousal/Alertness: Awake/alert Behavior During Therapy: WFL for tasks assessed/performed Overall Cognitive Status: No family/caregiver present to determine baseline cognitive functioning                                 General Comments: Followed most 1 step commands with increased time. Pt not oriented to date, situation or place.     General Comments  VSS on RA.    Exercises     Shoulder Instructions      Home Living Family/patient expects to be discharged to:: Private residence Living Arrangements: Children Available Help at Discharge: Family;Available 24 hours/day Type of Home: Mobile home Home Access: Ramped entrance     Home  Layout: One level     Bathroom Shower/Tub: Hospital doctor Toilet: Handicapped height     Home Equipment: Conservation officer, nature (2 wheels);Wheelchair - [employer] - single point;BSC/3in1      Lives With: Daughter    Prior Functioning/Environment Prior Level of Function : Needs assist;Patient poor historian/Family not available       Physical Assist : Mobility (physical);ADLs (physical) Mobility (physical): Bed mobility;Transfers;Gait ADLs (physical): Grooming;Bathing;Dressing;Toileting;IADLs Mobility Comments: Pt has been requiring assist of family for mobility and has been primarily at [facility] level, was amb short distance with assist at prior admission ADLs Comments: pt reports bathing herself, requires assistance for lower body dressing, assist for IADLs        OT Problem List: Decreased strength;Decreased activity tolerance;Impaired balance (sitting and/or standing);Decreased cognition;Decreased safety awareness;Decreased knowledge of use of DME or AE;Decreased knowledge of precautions;Cardiopulmonary status limiting activity;Decreased coordination;Impaired tone;Impaired UE functional use;Impaired sensation      OT Treatment/Interventions: Self-care/ADL training;Therapeutic exercise;DME and/or AE instruction;Cognitive remediation/compensation;Therapeutic activities;Patient/family education;Balance training;Neuromuscular education    OT Goals(Current goals can be found in the care plan section) Acute Rehab OT Goals Patient Stated Goal: unable to state OT Goal Formulation: Patient unable to participate in goal setting Time For Goal Achievement: 02/18/22 Potential to Achieve Goals: Good ADL Goals Pt Will Perform Grooming: with min guard assist;standing Pt Will Transfer to Toilet: with min guard assist;ambulating;bedside commode Additional ADL Goal #1: Pt will participate in x10 mins for OOB ADL tasks to increase independence.  OT Frequency: Min 2X/week    Co-evaluation               AM-PAC OT "6 Clicks" Daily Activity  Outcome Measure Help from another person eating meals?: A Little Help from another person taking care of personal grooming?: A Little Help from another person toileting, which includes using toliet, bedpan, or urinal?: Total Help from another person bathing (including washing, rinsing, drying)?: A Lot Help from another person to put on and taking off regular upper body clothing?: A Lot Help from another person to put on and taking off regular lower body clothing?: Total 6 Click Score: 12   End of Session Equipment Utilized During Treatment: Gait belt;Rolling walker (2 wheels) Nurse Communication: Mobility status  Activity Tolerance: Patient tolerated treatment well Patient left: in chair;with call bell/phone within reach;with chair alarm set  OT Visit Diagnosis: Unsteadiness on feet (R26.81);Muscle weakness (generalized) (M62.81) Hemiplegia - Right/Left: Right Hemiplegia - dominant/non-dominant: Dominant Hemiplegia - caused by: Cerebral infarction                Time: 2951-8841 OT Time Calculation (min): 18 min Charges:  OT General Charges $OT Visit: 1 Visit OT Evaluation $OT Eval Moderate Complexity: 1 Mod  Jefferey Pica, OTR/L Acute Rehabilitation Services Office: [place] 02/04/2022, 2:58 PM

## 2022-02-04 NOTE — Discharge Summary (Addendum)
Physician Discharge Summary  Valerie Mcclure EGB:[id number] DOB: 1940-08-01 DOA: 01/31/2022  PCP: [name], MD  Admit date: 01/31/2022 Discharge date: 02/05/2022  Admitted From: Home Disposition:  SNF  Discharge Condition:Stable CODE STATUS:DNR Diet recommendation: Heart Healthy   /Brief/Interim Summary:  Patient is a 81 female with history of stroke with right-sided spastic hemiplegia, dysarthria, hypertension, hypothyroidism, hyperlipidemia, diastolic CHF, diabetes type 2, orthostatic hypotension, CKD stage IIIb, A-fib on Eliquis who was brought to the hospital by her daughter after she became confused, bradycardic, hypotensive after given a dose of metoprolol.  She was recently discharged from here after she was managed for acute left-sided weakness, dysarthria, MRI/CT angiogram of the head and neck was not consistent with a stroke.  She was also treated for UTI and was discharged home on 7/13.  When EMS arrived, she was hypotensive with systolic blood pressure in the range of 70s. CT of the head did not show any acute findings.  Urinalysis with no signs of UTI.  She was admitted for the observation  of hypotension/bradycardia. PT/OT recommending SNF on discharge.  Medically stable for discharge whenever possible.  Following problems were addressed during her hospitalization:  Syncope/hypotension: History of orthostatic hypotension.  Episode of syncope during recent hospitalization.  She became hypotensive and briefly unresponsive after given metoprolol.  No evidence of heart block on EKG or telemetry.  Started on IV fluids, now stopped.  Metoprolol dose reduced.  Recent echo showed preserved ejection fraction.  PT/OT  consulted again, recommended skilled nursing facility. She might benefit with TED hose, midodrine.  Blood pressure acceptable   Diabetes type 2: Recent hemoglobin A1c of 5.8.    CKD stage IIIb: Currently kidney function around baseline.  Baseline creatinine 1.5-1.6.   Monitor BMP   Confusion: Patient is intermittently confused at baseline but more confused at home.   Might have underlying dementia.  CT head/MRI brain  unremarkable.  Recent vitamin B-12, TSH unremarkable.  Ammonia level normal. She remains oriented to place  Continue monitoring mental status, frequent reorientation, delirium precautions. Dose of nortriptyline reduced after discussion with pharmacy.   Hyperlipidemia: Continue statin   Paroxysmal A-fib:On Eliquis for anticoagulation.  Rate is controlled.Currently in NSR.Continue low dose metoprolol,if she becomes hypotensive,discontinue permanently   Chronic systolic CHF: Euvolemic.  On torsemide 10 mg at home, now on hold due to hypotension, orthostasis.     Hypothyroidism: Continue Synthyroid   Chronic urinary retention: Now voiding freely, Foley removed.  Tamsulosin was stopped due to orthostatic hypotension  Debility/deconditioning: Patient has significantly declined.  She was walking few weeks to months ago.  As per the son, she used to be  alert and oriented most of the time.  She declined after she got infected with COVID.  On her last visit, PT/OT recommend home health.  PT/OT recommending SNF on discharge.  Son is agreeable with that.  He is the healthcare power of attorney.  Discharge Diagnoses:  Principal Problem:   Orthostatic syncope Active Problems:   Acute metabolic encephalopathy   Type 2 diabetes mellitus with stage 3b chronic kidney disease, without long-term current use of insulin ([name])   Mixed diabetic hyperlipidemia associated with type 2 diabetes mellitus ([name])   Chronic diastolic heart failure ([name])   Paroxysmal atrial fibrillation ([name])   Chronic kidney disease, stage 3b ([place])   Hypothyroidism    Discharge Instructions  Discharge Instructions     Diet - low sodium heart healthy   Complete by: As directed    Discharge  instructions   Complete by: As directed    1)Please take prescribed medication as  instructed   Increase activity slowly   Complete by: As directed    No wound care   Complete by: As directed       Allergies as of 02/05/2022       Reactions   Clonazepam Other (See Comments)   AFFECTED MOOD NEGATIVELY- Do NOT give   Shrimp [shellfish Allergy] Anaphylaxis, Swelling, Rash, Other (See Comments)   Break outs and swelling of the throat   Valsartan Other (See Comments)   Renal failure   Tandem Plus [fefum-fepo-fa-b Cmp-c-zn-mn-cu] Nausea And Vomiting   Ivp Dye [iodinated Contrast Media] Itching, Rash   Penicillins Itching, Rash   Has patient had a PCN reaction causing immediate rash, facial/tongue/throat swelling, SOB or lightheadedness with hypotension: Yes Has patient had a PCN reaction causing severe rash involving mucus membranes or skin necrosis: No Has patient had a PCN reaction that required hospitalization: No Has patient had a PCN reaction occurring within the last 10 years: No If all of the above answers are "NO", then may proceed with Cephalosporin use.        Medication List     STOP taking these medications    tamsulosin 0.4 MG Caps capsule Commonly known as: FLOMAX   torsemide 10 MG tablet Commonly known as: DEMADEX       TAKE these medications    acetaminophen 500 MG tablet Commonly known as: TYLENOL Take 2 tablets (1,000 mg total) by mouth every 8 (eight) hours as needed for mild pain or headache.   atorvastatin 20 MG tablet Commonly known as: LIPITOR TAKE 1 TABLET(20 MG) BY MOUTH DAILY What changed: See the new instructions.   Eliquis 2.5 MG Tabs tablet Generic drug: apixaban TAKE 1 TABLET(2.5 MG) BY MOUTH TWICE DAILY What changed: See the new instructions.   FLUoxetine 40 MG capsule Commonly known as: PROZAC TAKE 1 CAPSULE(40 MG) BY MOUTH DAILY What changed: See the new instructions.   levothyroxine 75 MCG tablet Commonly known as: SYNTHROID TAKE 1 TABLET(75 MCG) BY MOUTH DAILY BEFORE BREAKFAST. FOLLOW-UP Strength: 75  mcg What changed:  how much to take how to take this when to take this additional instructions   metoprolol tartrate 25 MG tablet Commonly known as: LOPRESSOR Take 0.5 tablets (12.5 mg total) by mouth 2 (two) times daily. What changed: how much to take   midodrine 5 MG tablet Commonly known as: PROAMATINE Take 1 tablet (5 mg total) by mouth 3 (three) times daily with meals.   nortriptyline 10 MG capsule Commonly known as: PAMELOR Take 3 capsules (30 mg total) by mouth at bedtime. What changed:  medication strength how much to take additional instructions   polyethylene glycol 17 g packet Commonly known as: MIRALAX / GLYCOLAX Take 17 g by mouth daily. Take daily to avoid straining   senna-docusate 8.6-50 MG tablet Commonly known as: Senokot-S Take 2 tablets by mouth 2 (two) times daily.        Contact information for after-discharge care     Destination     [facility] Preferred SNF .   Service: Skilled Nursing Contact information: [address] [address] [phone]                    Allergies  Allergen Reactions   Clonazepam Other (See Comments)    AFFECTED MOOD NEGATIVELY- Do NOT give   Shrimp [Shellfish Allergy] Anaphylaxis,  Swelling, Rash and Other (See Comments)    Break outs and swelling of the throat   Valsartan Other (See Comments)    Renal failure   Tandem Plus [Fefum-Fepo-Fa-B Cmp-C-Zn-Mn-Cu] Nausea And Vomiting   Ivp Dye [Iodinated Contrast Media] Itching and Rash   Penicillins Itching and Rash    Has patient had a PCN reaction causing immediate rash, facial/tongue/throat swelling, SOB or lightheadedness with hypotension: Yes Has patient had a PCN reaction causing severe rash involving mucus membranes or skin necrosis: No Has patient had a PCN reaction that required hospitalization: No Has patient had a PCN reaction occurring within the last 10 years: No If all of the above answers  are "NO", then may proceed with Cephalosporin use.     Consultations: None   Procedures/Studies: CT Head Wo Contrast  Result Date: 02/01/2022 CLINICAL DATA:  Mental status change, unknown cause. EXAM: CT HEAD WITHOUT CONTRAST TECHNIQUE: Contiguous axial images were obtained from the base of the skull through the vertex without intravenous contrast. RADIATION DOSE REDUCTION: This exam was performed according to the departmental dose-optimization program which includes automated exposure control, adjustment of the mA and/or kV according to patient size and/or use of iterative reconstruction technique. COMPARISON:  Head CT 01/27/2022, MRI brain 01/28/2022. FINDINGS: Brain: Chronic infarcts are again noted in the left gangliocapsular region and anterior left temporal lobe with encephalomalacia. Background mild atrophy and small-vessel disease with stable ventricular asymmetry due to prior left MCA infarct. No new asymmetry is seen concerning for acute cortical based infarct, hemorrhage or mass effect. Basal cisterns are clear. Vascular: There are patchy calcifications of the carotid siphons but no hyperdense central vessels. Skull: No fracture or focal calvarial lesion. The calvarium, skull base and orbits are intact. Sinuses/Orbits: No acute finding. Other: None. IMPRESSION: No acute intracranial CT findings or interval changes. Old left MCA infarct and additional chronic changes. Electronically Signed   By: Telford Nab M.D.   On: 02/01/2022 01:37   DG Chest Port 1 View  Result Date: 02/01/2022 CLINICAL DATA:  Altered mental status.  Confusion. EXAM: PORTABLE CHEST 1 VIEW COMPARISON:  01/26/2022 FINDINGS: Stable mild cardiomegaly. Unchanged mediastinal contours. Aortic atherosclerosis. Improved left basilar aeration. No pulmonary edema, confluent consolidation, pleural effusion or pneumothorax. Stable osseous structures. IMPRESSION: Stable mild cardiomegaly. No acute chest findings. Electronically  Signed   By: Keith Rake M.D.   On: 02/01/2022 01:12   MR BRAIN WO CONTRAST  Result Date: 01/28/2022 CLINICAL DATA:  Neuro deficit, acute, stroke suspected. EXAM: MRI HEAD WITHOUT CONTRAST TECHNIQUE: Multiplanar, multiecho pulse sequences of the brain and surrounding structures were obtained without intravenous contrast. COMPARISON:  Head CT January 27, 2022; MRI of the brain January 21, 2022. FINDINGS: Brain: No acute infarction, hemorrhage, hydrocephalus, extra-axial collection or mass lesion. Area of encephalomalacia and gliosis with hemosiderin deposit involving the left temporal lobe, insular region, basal ganglia, anterior left thalamus and internal capsule consistent with known left MCA territory infarct. Decreased volume of the left thalamus, left cerebral peduncle and pons related to wallerian degeneration. Minimal white matter disease supratentorially, moderate within the pons, unchanged Vascular: Normal flow voids. Skull and upper cervical spine: Normal marrow signal. Sinuses/Orbits: Negative. Other: A 1.4 cm right parotid lesion, unchanged when compared to recent MRI. IMPRESSION: 1. No acute intracranial abnormality. 2. Stable appearance of left MCA territory infarct with associated wallerian degeneration. 3. Right parotid lesion with slow growth since 2018, unchanged since most recent MR. Electronically Signed   By: Pedro Earls  M.D.   On: 01/28/2022 13:19   CT HEAD WO CONTRAST (5MM)  Result Date: 01/27/2022 CLINICAL DATA:  Neuro deficit, acute, stroke suspected EXAM: CT HEAD WITHOUT CONTRAST TECHNIQUE: Contiguous axial images were obtained from the base of the skull through the vertex without intravenous contrast. RADIATION DOSE REDUCTION: This exam was performed according to the departmental dose-optimization program which includes automated exposure control, adjustment of the mA and/or kV according to patient size and/or use of iterative reconstruction technique. COMPARISON:   01/21/2022 FINDINGS: Brain: Old large left basal ganglia/internal capsule infarct. Old left temporal lobe infarct. These findings are unchanged since prior study. Ex vacuo dilatation of the left lateral ventricle. No acute infarction, hemorrhage or hydrocephalus. Vascular: No hyperdense vessel or unexpected calcification. Skull: No acute calvarial abnormality. Sinuses/Orbits: No acute findings Other: None IMPRESSION: Old left MCA infarct. No acute intracranial abnormality. Electronically Signed   By: Rolm Baptise M.D.   On: 01/27/2022 01:19   DG CHEST PORT 1 VIEW  Result Date: 01/27/2022 CLINICAL DATA:  Crackling in chest. EXAM: PORTABLE CHEST 1 VIEW COMPARISON:  Radiograph 01/21/2022. FINDINGS: Mild cardiomegaly, similar. Aortic atherosclerosis. Slightly indistinct left hemidiaphragm may represent atelectasis or effusion. No pulmonary edema. No pneumothorax. No confluent airspace disease. IMPRESSION: 1. Mild cardiomegaly. 2. Slightly indistinct left hemidiaphragm may represent atelectasis or effusion. Electronically Signed   By: Keith Rake M.D.   On: 01/27/2022 00:02   DG Knee 1-2 Views Left  Result Date: 01/26/2022 CLINICAL DATA:  Left knee pain EXAM: LEFT KNEE - 1-2 VIEW COMPARISON:  08/03/2012 FINDINGS: Moderate tricompartment degenerative changes with joint space narrowing and spurring, progressed since prior study. No acute bony abnormality. Specifically, no fracture, subluxation, or dislocation. No joint effusion. Soft tissues are intact. IMPRESSION: Progressive moderate tricompartment degenerative changes. No acute bony abnormality. Electronically Signed   By: Rolm Baptise M.D.   On: 01/26/2022 19:14   MR BRAIN WO CONTRAST  Result Date: 01/21/2022 CLINICAL DATA:  New left-sided weakness with history of stroke. EXAM: MRI HEAD WITHOUT CONTRAST TECHNIQUE: Multiplanar, multiecho pulse sequences of the brain and surrounding structures were obtained without intravenous contrast. COMPARISON:   Same-day CTA head/neck MRI head 12/09/2018, CTA head/neck 11/29/2016 FINDINGS: Brain: There is no evidence of acute intracranial hemorrhage, extra-axial fluid collection, or acute infarct. Encephalomalacia in the left basal ganglia and temporal lobe in the MCA distribution with associated chronic blood products is unchanged consistent with prior infarct. There is unchanged associated ex vacuo dilatation of the left lateral ventricle and wallerian degeneration in the left cerebral peduncle. The ventricles are otherwise normal in size and configuration. Background parenchymal volume is otherwise normal. There is no significant burden of underlying chronic white matter disease. There is no mass lesion.  There is no mass effect or midline shift. Vascular: Normal flow voids. Skull and upper cervical spine: Normal marrow signal. Sinuses/Orbits: The paranasal sinuses are clear. The globes and orbits are unremarkable. Other: There is a 1.5 cm T1 hypointense, diffusion restricting lesion in the right parotid gland. The lesion was present in 2018 but has slightly increased in size. IMPRESSION: 1. No acute intracranial pathology. 2. Unchanged remote infarct with associated chronic blood products in the left MCA distribution. 3. 1.5 cm lesion in the right parotid gland has slightly increased in size since 2018, favored to be benign in etiology given slow growth. Recommend nonemergent ENT referral. Electronically Signed   By: Valetta Mole M.D.   On: 01/21/2022 15:47   DG CHEST PORT 1 VIEW  Result Date:  01/21/2022 CLINICAL DATA:  81 year old female with a history of hypothermia EXAM: PORTABLE CHEST 1 VIEW COMPARISON:  12/31/2021 FINDINGS: Cardiomediastinal silhouette unchanged in size and contour. No evidence of central vascular congestion. No interlobular septal thickening. No pneumothorax or pleural effusion. Coarsened interstitial markings, with no confluent airspace disease. No acute displaced fracture. IMPRESSION:  Negative for acute cardiopulmonary disease Electronically Signed   By: Corrie Mckusick D.O.   On: 01/21/2022 13:23   CT ANGIO HEAD NECK W WO CM (CODE STROKE)  Result Date: 01/21/2022 CLINICAL DATA:  Patient presented with left-sided weakness and then developed aphasia. History of left MCA stroke. EXAM: CT ANGIOGRAPHY HEAD AND NECK TECHNIQUE: Multidetector CT imaging of the head and neck was performed using the standard protocol during bolus administration of intravenous contrast. Multiplanar CT image reconstructions and MIPs were obtained to evaluate the vascular anatomy. Carotid stenosis measurements (when applicable) are obtained utilizing NASCET criteria, using the distal internal carotid diameter as the denominator. RADIATION DOSE REDUCTION: This exam was performed according to the departmental dose-optimization program which includes automated exposure control, adjustment of the mA and/or kV according to patient size and/or use of iterative reconstruction technique. CONTRAST:  65m OMNIPAQUE IOHEXOL 350 MG/ML SOLN COMPARISON:  CT head 01/21/2022 FINDINGS: CTA NECK FINDINGS Aortic arch: Mild atherosclerotic calcification aortic arch and proximal great vessels. Proximal great vessels widely patent Right carotid system: Mild atherosclerotic calcification right carotid bifurcation without stenosis. Left carotid system: Atherosclerotic calcification left carotid bifurcation and proximal left internal carotid artery. 25% diameter stenosis proximal left internal carotid artery due to calcific plaque. Vertebral arteries: Both vertebral arteries widely patent and normal Skeleton: Cervical spondylosis without acute skeletal abnormality. Other neck: 10 mm right thyroid nodule with peripheral calcification. No further imaging recommended. (Ref: J Am Coll Radiol. 2015 Feb;12(2): 143-50). Upper chest: Lung apices clear bilaterally. Review of the MIP images confirms the above findings CTA HEAD FINDINGS Anterior circulation:  Mild atherosclerotic calcification in the cavernous carotid bilaterally without stenosis. Anterior and middle cerebral arteries patent without large vessel occlusion. Mild stenosis inferior division left middle cerebral artery. Right MCA widely patent. Anterior cerebral arteries widely patent bilaterally. Posterior circulation: Both vertebral arteries patent to the basilar without stenosis. PICA patent bilaterally. Basilar widely patent. Superior cerebellar and posterior cerebral arteries patent bilaterally without stenosis. Fetal origin left posterior cerebral artery. Venous sinuses: Normal venous enhancement Anatomic variants: None Review of the MIP images confirms the above findings IMPRESSION: 1. Negative for intracranial large vessel occlusion 2. Mild atherosclerotic disease in the carotid bifurcation without flow limiting stenosis. 25% diameter stenosis proximal left internal carotid artery. 3. These results were called by telephone at the time of interpretation on 01/21/2022 at 11:53 am to provider [facility], who verbally acknowledged these results. Electronically Signed   By: CFranchot GalloM.D.   On: 01/21/2022 11:54   CT HEAD CODE STROKE WO CONTRAST  Result Date: 01/21/2022 CLINICAL DATA:  Code stroke. Acute neuro deficit. Left-sided weakness and dysarthria. EXAM: CT HEAD WITHOUT CONTRAST TECHNIQUE: Contiguous axial images were obtained from the base of the skull through the vertex without intravenous contrast. RADIATION DOSE REDUCTION: This exam was performed according to the departmental dose-optimization program which includes automated exposure control, adjustment of the mA and/or kV according to patient size and/or use of iterative reconstruction technique. COMPARISON:  CT head 12/13/2021 FINDINGS: Brain: Negative for acute infarct, hemorrhage, mass. Chronic left MCA infarct unchanged. No right-sided infarct identified. Vascular: Negative for hyperdense vessel Skull: Negative Sinuses/Orbits:  Paranasal sinuses  clear.  Negative orbit Other: None ASPECTS ([place] Stroke Program Early CT Score) - Ganglionic level infarction (caudate, lentiform nuclei, internal capsule, insula, M1-M3 cortex): 7 - Supraganglionic infarction (M4-M6 cortex): 3 Total score (0-10 with 10 being normal): 10 IMPRESSION: 1. Negative for acute abnormality. Chronic left MCA infarct unchanged 2. ASPECTS is 10 3. Code stroke imaging results were communicated on 01/21/2022 at 11:34 am to provider Lorrin Goodell via text page Electronically Signed   By: Franchot Gallo M.D.   On: 01/21/2022 11:34      Subjective: Patient seen and examined at the bedside this morning.  She is comfortable, alert and awake, communicates well but not oriented to time.  Oriented to place only.  Blood pressure is better  Discharge Exam: Vitals:   02/05/22 0322 02/05/22 0718  BP: 118/60 (!) 122/48  Pulse: 77 80  Resp: 16 16  Temp: 98.2 F (36.8 C) 97.9 F (36.6 C)  SpO2: 97% 95%   Vitals:   02/04/22 0857 02/04/22 1941 02/05/22 0322 02/05/22 0718  BP:  (!) 125/49 118/60 (!) 122/48  Pulse: 91 84 77 80  Resp: '15 17 16 16  '$ Temp: (!) 97.4 F (36.3 C) 98.6 F (37 C) 98.2 F (36.8 C) 97.9 F (36.6 C)  TempSrc: Oral Oral Oral Oral  SpO2: 97% 97% 97% 95%  Weight:   67.2 kg     General: Pt is alert, awake, not in acute distress Cardiovascular: RRR, S1/S2 +, no rubs, no gallops Respiratory: CTA bilaterally, no wheezing, no rhonchi Abdominal: Soft, NT, ND, bowel sounds + Extremities: no edema, no cyanosis    The results of significant diagnostics from this hospitalization (including imaging, microbiology, ancillary and laboratory) are listed below for reference.     Microbiology: No results found for this or any previous visit (from the past 240 hour(s)).   Labs: BNP (last 3 results) Recent Labs    12/13/21 0333  BNP 2,119.4*   Basic Metabolic Panel: Recent Labs  Lab 01/31/22 2329 02/01/22 0516 02/02/22 0456 02/03/22 0911  02/05/22 0258  NA 137 139 139 140 136  K 4.1 3.5 4.4 4.2 3.8  CL 104 107 103 107 109  CO2 21* 21* 22 22 20*  GLUCOSE 134* 113* 118* 93 92  BUN '18 17 18 17 15  '$ CREATININE 1.83* 1.74* 1.80* 1.75* 1.59*  CALCIUM 8.9 8.2* 9.3 8.8* 8.6*  MG  --  1.6*  --   --   --    Liver Function Tests: Recent Labs  Lab 01/31/22 2329 02/01/22 0516  AST 20 20  ALT 14 14  ALKPHOS 67 63  BILITOT 0.5 0.4  PROT 5.6* 5.3*  ALBUMIN 2.8* 2.7*   No results for input(s): "LIPASE", "AMYLASE" in the last 168 hours. Recent Labs  Lab 02/01/22 1010  AMMONIA <10   CBC: Recent Labs  Lab 01/31/22 2329 02/01/22 0516  WBC 6.9 6.9  NEUTROABS 3.9 4.5  HGB 9.7* 9.1*  HCT 32.2* 29.0*  MCV 96.7 94.2  PLT 232 198   Cardiac Enzymes: No results for input(s): "CKTOTAL", "CKMB", "CKMBINDEX", "TROPONINI" in the last 168 hours. BNP: Invalid input(s): "POCBNP" CBG: Recent Labs  Lab 02/04/22 0557 02/04/22 1154 02/04/22 1605 02/04/22 2109 02/05/22 0610  GLUCAP 112* 106* 140* 141* 108*   D-Dimer No results for input(s): "DDIMER" in the last 72 hours. Hgb A1c No results for input(s): "HGBA1C" in the last 72 hours. Lipid Profile No results for input(s): "CHOL", "HDL", "LDLCALC", "TRIG", "CHOLHDL", "LDLDIRECT" in the last 72  hours. Thyroid function studies No results for input(s): "TSH", "T4TOTAL", "T3FREE", "THYROIDAB" in the last 72 hours.  Invalid input(s): "FREET3" Anemia work up No results for input(s): "VITAMINB12", "FOLATE", "FERRITIN", "TIBC", "IRON", "RETICCTPCT" in the last 72 hours. Urinalysis    Component Value Date/Time   COLORURINE YELLOW 02/01/2022 0205   APPEARANCEUR CLEAR 02/01/2022 0205   LABSPEC 1.008 02/01/2022 0205   PHURINE 6.0 02/01/2022 0205   GLUCOSEU NEGATIVE 02/01/2022 0205   GLUCOSEU NEGATIVE 11/18/2010 0859   HGBUR SMALL (A) 02/01/2022 0205   HGBUR negative 12/13/2008 0944   BILIRUBINUR NEGATIVE 02/01/2022 0205   KETONESUR NEGATIVE 02/01/2022 0205   PROTEINUR  NEGATIVE 02/01/2022 0205   UROBILINOGEN 0.2 11/18/2010 0859   NITRITE NEGATIVE 02/01/2022 0205   LEUKOCYTESUR NEGATIVE 02/01/2022 0205   Sepsis Labs Recent Labs  Lab 01/31/22 2329 02/01/22 0516  WBC 6.9 6.9   Microbiology No results found for this or any previous visit (from the past 240 hour(s)).  Please note: You were cared for by a hospitalist during your hospital stay. Once you are discharged, your primary care physician will handle any further medical issues. Please note that NO REFILLS for any discharge medications will be authorized once you are discharged, as it is imperative that you return to your primary care physician (or establish a relationship with a primary care physician if you do not have one) for your post hospital discharge needs so that they can reassess your need for medications and monitor your lab values.    Time coordinating discharge: 40 minutes  SIGNED:   Shelly Coss, MD  [facility] 02/05/2022, 8:41 AM Pager [phone]  If 7PM-7AM, please contact night-coverage [email or web address] Password [email or web address]

## 2022-02-05 DIAGNOSIS — R1311 Dysphagia, oral phase: Secondary | ICD-10-CM | POA: Diagnosis not present

## 2022-02-05 DIAGNOSIS — I69351 Hemiplegia and hemiparesis following cerebral infarction affecting right dominant side: Secondary | ICD-10-CM | POA: Diagnosis not present

## 2022-02-05 DIAGNOSIS — R2681 Unsteadiness on feet: Secondary | ICD-10-CM | POA: Diagnosis not present

## 2022-02-05 DIAGNOSIS — Z741 Need for assistance with personal care: Secondary | ICD-10-CM | POA: Diagnosis not present

## 2022-02-05 DIAGNOSIS — I69320 Aphasia following cerebral infarction: Secondary | ICD-10-CM | POA: Diagnosis not present

## 2022-02-05 DIAGNOSIS — R41841 Cognitive communication deficit: Secondary | ICD-10-CM | POA: Diagnosis not present

## 2022-02-05 DIAGNOSIS — Z743 Need for continuous supervision: Secondary | ICD-10-CM | POA: Diagnosis not present

## 2022-02-05 DIAGNOSIS — M6259 Muscle wasting and atrophy, not elsewhere classified, multiple sites: Secondary | ICD-10-CM | POA: Diagnosis not present

## 2022-02-05 DIAGNOSIS — I959 Hypotension, unspecified: Secondary | ICD-10-CM | POA: Diagnosis not present

## 2022-02-05 DIAGNOSIS — I951 Orthostatic hypotension: Secondary | ICD-10-CM | POA: Diagnosis not present

## 2022-02-05 DIAGNOSIS — E119 Type 2 diabetes mellitus without complications: Secondary | ICD-10-CM | POA: Diagnosis not present

## 2022-02-05 DIAGNOSIS — I69328 Other speech and language deficits following cerebral infarction: Secondary | ICD-10-CM | POA: Diagnosis not present

## 2022-02-05 DIAGNOSIS — M6281 Muscle weakness (generalized): Secondary | ICD-10-CM | POA: Diagnosis not present

## 2022-02-05 DIAGNOSIS — I69391 Dysphagia following cerebral infarction: Secondary | ICD-10-CM | POA: Diagnosis not present

## 2022-02-05 LAB — BASIC METABOLIC PANEL
Anion gap: 7 (ref 5–15)
BUN: 15 mg/dL (ref 8–23)
CO2: 20 mmol/L — ABNORMAL LOW (ref 22–32)
Calcium: 8.6 mg/dL — ABNORMAL LOW (ref 8.9–10.3)
Chloride: 109 mmol/L (ref 98–111)
Creatinine, Ser: 1.59 mg/dL — ABNORMAL HIGH (ref 0.44–1.00)
GFR, Estimated: 32 mL/min — ABNORMAL LOW (ref >60)
Glucose, Bld: 92 mg/dL (ref 70–99)
Potassium: 3.8 mmol/L (ref 3.5–5.1)
Sodium: 136 mmol/L (ref 135–145)

## 2022-02-05 LAB — GLUCOSE, CAPILLARY: Glucose-Capillary: 108 mg/dL — ABNORMAL HIGH (ref 70–99)

## 2022-02-05 NOTE — TOC Transition Note (Signed)
Transition of Care [facility]) - CM/SW Discharge Note   Patient Details  Name: RUBI TOOLEY MRN: [id number] Date of Birth: 1941-04-17  Transition of [facility]) CM/SW Contact:  Coralee Pesa, [place] Phone Number: 02/05/2022, 10:08 AM   Clinical Narrative:    Pt to be transported to [facility] via [place]. Nurse to call report to [phone].   Final next level of care: [place] Barriers to Discharge: Barriers Resolved   Patient Goals and CMS Choice        Discharge Placement              Patient chooses bed at: [facility] Patient to be transferred to facility by: [place] Name of family member notified: [name] Patient and family notified of of transfer: 02/05/22  Discharge Plan and Services   Discharge Planning Services: CM Consult Post Acute Care Choice: Resumption of Svcs/PTA Provider, Home Health            DME Agency: NA       HH Arranged: RN, PT, OT [facility] Agency: [place] Date [facility] Agency Contacted: 01/29/22 Time [place]: 1440 Representative spoke with at [place]: Tommi Rumps  Social Determinants of Health ([place]) Interventions     Readmission Risk Interventions     No data to display

## 2022-02-05 NOTE — Progress Notes (Signed)
Patient seen and examined at the bedside this morning.  Hemodynamically stable for discharge today.  Discharge orders and summary already in place.  No change in the medical management.

## 2022-02-09 ENCOUNTER — Ambulatory Visit: Payer: HMO | Admitting: Internal Medicine

## 2022-02-16 ENCOUNTER — Telehealth: Payer: HMO

## 2022-02-17 ENCOUNTER — Other Ambulatory Visit: Payer: Self-pay | Admitting: Internal Medicine

## 2022-02-18 ENCOUNTER — Ambulatory Visit: Payer: PPO

## 2022-02-27 ENCOUNTER — Telehealth: Payer: Self-pay

## 2022-02-27 ENCOUNTER — Ambulatory Visit: Payer: HMO

## 2022-02-27 NOTE — Telephone Encounter (Signed)
Patient was scheduled for AWV-S via phone; Left v-msg for patient's son, [name] to return call to complete visit at [phone].  If he needs to reschedule, please call switchboard [phone].

## 2022-02-27 NOTE — Telephone Encounter (Signed)
This nurse called patient for scheduled telephonic AWV. Person that answered phone said that she was unavailable. Told her we will call back to reschedule for another time.

## 2022-02-27 NOTE — Progress Notes (Signed)
No charge. 

## 2022-03-20 DEATH — deceased

## 2022-04-14 ENCOUNTER — Encounter: Payer: Self-pay | Admitting: Internal Medicine

## 2022-04-14 NOTE — Progress Notes (Deleted)
Subjective:    Patient ID: Valerie Mcclure, female    DOB: Jan 18, 1941, 81 y.o.   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  She is here with her daughter [name].   Memory Decision making    Medications and allergies reviewed with patient and updated if appropriate.  Current Outpatient Medications on File Prior to Visit  Medication Sig Dispense Refill   ACETAMINOPHEN EXTRA STRENGTH 500 MG tablet NEW PRESCRIPTION REQUEST: Acetaminophen Extra Strength 500 MG Tablet- TAKE TWO TABLETS BY MOUTH EVERY 6 HOURS AS NEEDED 720 tablet 3   apixaban (ELIQUIS) 2.5 MG TABS tablet Take 1 tablet (2.5 mg total) by mouth 2 (two) times daily. 180 tablet 2   atorvastatin (LIPITOR) 20 MG tablet Take 1 tablet (20 mg total) by mouth daily. 90 tablet 2   CALCIUM + VITAMIN D3 600-10 MG-MCG TABS NEW PRESCRIPTION REQUEST: Calcium With Vitamin DAILY 600 Mg-10 Mcg (400 Unit) Tablet- TAKE ONE TABLET BY MOUTH DAILY 90 tablet 3   FLUoxetine (PROZAC) 40 MG capsule Take 1 capsule (40 mg total) by mouth in the morning. 90 capsule 2   levothyroxine (SYNTHROID) 75 MCG tablet Take 1 tablet (75 mcg total) by mouth daily before breakfast. 90 tablet 2   metoprolol tartrate (LOPRESSOR) 25 MG tablet NEW PRESCRIPTION REQUEST: Metoprolol Tartrate 25 MG Tablet- TAKE 1/2 TABLET BY MOUTH TWICE DAILY 90 tablet 2   midodrine (PROAMATINE) 5 MG tablet Take 1 tablet (5 mg total) by mouth 3 (three) times daily with meals.     nortriptyline (PAMELOR) 10 MG capsule NEW PRESCRIPTION REQUEST: Nortriptyline 10 MG Capsule- TAKE FOUR CAPSULES BY MOUTH DAILY 360 capsule 2   polyethylene glycol (MIRALAX / GLYCOLAX) 17 g packet Take 17 g by mouth daily. Take daily to avoid straining 14 each 0   senna-docusate (SENOKOT-S)  8.6-50 MG tablet Take 2 tablets by mouth 2 (two) times daily. (Patient not taking: Reported on 01/21/2022)     tamsulosin (FLOMAX) 0.4 MG CAPS capsule NEW PRESCRIPTION REQUEST: Tamsulosin 0.4 MG Capsule- TAKE ONE CAPSULE BY MOUTH DAILY 90 capsule 2   torsemide (DEMADEX) 10 MG tablet NEW PRESCRIPTION REQUEST: Torsemide 10 MG Tablet- TAKE ONE TABLET BY MOUTH DAILY 90 tablet 2   No current facility-administered medications on file prior to visit.     Review of Systems     Objective:  There were no vitals filed for this visit. BP Readings from Last 3 Encounters:  02/05/22 (!) 116/41  01/29/22 (!) 115/41  01/03/22 (!) 137/54   Wt Readings from Last 3 Encounters:  02/05/22 148 lb 2.4 oz (67.2 kg)  01/21/22 166 lb 3.6 oz (75.4 kg)  12/17/21 167 lb 15.9 oz (76.2 kg)   There is no height or weight on file to calculate BMI.    Physical Exam     Lab Results  Component Value Date   WBC 6.9 02/01/2022   HGB 9.1 (L) 02/01/2022   HCT 29.0 (L) 02/01/2022   PLT 198 02/01/2022   GLUCOSE 92 02/05/2022   CHOL 94 01/22/2022   TRIG 66 01/22/2022   HDL 46 01/22/2022   LDLDIRECT 57.0 10/13/2021   LDLCALC 35 01/22/2022   ALT 14 02/01/2022   AST 20 02/01/2022   NA 136 02/05/2022   K  3.8 02/05/2022   CL 109 02/05/2022   CREATININE 1.59 (H) 02/05/2022   BUN 15 02/05/2022   CO2 20 (L) 02/05/2022   TSH 9.936 (H) 01/27/2022   INR 1.3 (H) 01/21/2022   HGBA1C 5.8 (H) 01/22/2022     Assessment & Plan:    See Problem List for Assessment and Plan of chronic medical problems.

## 2022-04-15 ENCOUNTER — Ambulatory Visit: Payer: HMO | Admitting: Internal Medicine

## 2022-04-15 DIAGNOSIS — I10 Essential (primary) hypertension: Secondary | ICD-10-CM

## 2022-04-15 DIAGNOSIS — I4819 Other persistent atrial fibrillation: Secondary | ICD-10-CM

## 2022-04-15 DIAGNOSIS — I2510 Atherosclerotic heart disease of native coronary artery without angina pectoris: Secondary | ICD-10-CM

## 2022-04-15 DIAGNOSIS — E039 Hypothyroidism, unspecified: Secondary | ICD-10-CM

## 2022-04-15 DIAGNOSIS — F3289 Other specified depressive episodes: Secondary | ICD-10-CM

## 2022-04-15 DIAGNOSIS — E1169 Type 2 diabetes mellitus with other specified complication: Secondary | ICD-10-CM

## 2022-04-15 DIAGNOSIS — G479 Sleep disorder, unspecified: Secondary | ICD-10-CM

## 2022-04-15 DIAGNOSIS — I69391 Dysphagia following cerebral infarction: Secondary | ICD-10-CM

## 2022-04-15 DIAGNOSIS — N1832 Chronic kidney disease, stage 3b: Secondary | ICD-10-CM

## 2022-04-15 DIAGNOSIS — I5032 Chronic diastolic (congestive) heart failure: Secondary | ICD-10-CM

## 2022-04-16 ENCOUNTER — Telehealth: Payer: Self-pay

## 2022-04-16 NOTE — Telephone Encounter (Signed)
Called son to check on patient as she missed a visit yesterday, son informed me that she passed 04/02/2022.
